# Patient Record
Sex: Male | Born: 1935 | Race: White | Hispanic: No | Smoking: Former smoker
Health system: Southern US, Community
[De-identification: ages and names within clinical notes are randomized; demographics above are authoritative.]

## PROBLEM LIST (undated history)

## (undated) DIAGNOSIS — R569 Unspecified convulsions: Secondary | ICD-10-CM

## (undated) DIAGNOSIS — F0151 Vascular dementia with behavioral disturbance: Secondary | ICD-10-CM

## (undated) DIAGNOSIS — D62 Acute posthemorrhagic anemia: Secondary | ICD-10-CM

## (undated) DIAGNOSIS — I480 Paroxysmal atrial fibrillation: Secondary | ICD-10-CM

## (undated) DIAGNOSIS — I10 Essential (primary) hypertension: Secondary | ICD-10-CM

## (undated) DIAGNOSIS — Z950 Presence of cardiac pacemaker: Secondary | ICD-10-CM

## (undated) DIAGNOSIS — E118 Type 2 diabetes mellitus with unspecified complications: Secondary | ICD-10-CM

## (undated) DIAGNOSIS — I6330 Cerebral infarction due to thrombosis of unspecified cerebral artery: Secondary | ICD-10-CM

## (undated) DIAGNOSIS — R031 Nonspecific low blood-pressure reading: Secondary | ICD-10-CM

## (undated) DIAGNOSIS — I639 Cerebral infarction, unspecified: Secondary | ICD-10-CM

## (undated) DIAGNOSIS — N400 Enlarged prostate without lower urinary tract symptoms: Secondary | ICD-10-CM

## (undated) DIAGNOSIS — E785 Hyperlipidemia, unspecified: Secondary | ICD-10-CM

## (undated) DIAGNOSIS — I69393 Ataxia following cerebral infarction: Secondary | ICD-10-CM

## (undated) DIAGNOSIS — I69991 Dysphagia following unspecified cerebrovascular disease: Secondary | ICD-10-CM

## (undated) DIAGNOSIS — K219 Gastro-esophageal reflux disease without esophagitis: Secondary | ICD-10-CM

## (undated) DIAGNOSIS — M1990 Unspecified osteoarthritis, unspecified site: Secondary | ICD-10-CM

## (undated) DIAGNOSIS — Z7901 Long term (current) use of anticoagulants: Secondary | ICD-10-CM

## (undated) DIAGNOSIS — I2510 Atherosclerotic heart disease of native coronary artery without angina pectoris: Secondary | ICD-10-CM

## (undated) DIAGNOSIS — G9340 Encephalopathy, unspecified: Secondary | ICD-10-CM

## (undated) DIAGNOSIS — I69398 Other sequelae of cerebral infarction: Secondary | ICD-10-CM

## (undated) DIAGNOSIS — I619 Nontraumatic intracerebral hemorrhage, unspecified: Secondary | ICD-10-CM

## (undated) DIAGNOSIS — F0390 Unspecified dementia without behavioral disturbance: Secondary | ICD-10-CM

## (undated) DIAGNOSIS — Z8673 Personal history of transient ischemic attack (TIA), and cerebral infarction without residual deficits: Secondary | ICD-10-CM

## (undated) DIAGNOSIS — I69359 Hemiplegia and hemiparesis following cerebral infarction affecting unspecified side: Secondary | ICD-10-CM

## (undated) DIAGNOSIS — I951 Orthostatic hypotension: Secondary | ICD-10-CM

## (undated) DIAGNOSIS — N182 Chronic kidney disease, stage 2 (mild): Secondary | ICD-10-CM

## (undated) DIAGNOSIS — I441 Atrioventricular block, second degree: Secondary | ICD-10-CM

## (undated) DIAGNOSIS — R4182 Altered mental status, unspecified: Secondary | ICD-10-CM

## (undated) DIAGNOSIS — G459 Transient cerebral ischemic attack, unspecified: Secondary | ICD-10-CM

## (undated) DIAGNOSIS — R269 Unspecified abnormalities of gait and mobility: Secondary | ICD-10-CM

## (undated) DIAGNOSIS — G8191 Hemiplegia, unspecified affecting right dominant side: Secondary | ICD-10-CM

## (undated) DIAGNOSIS — I4891 Unspecified atrial fibrillation: Secondary | ICD-10-CM

## (undated) DIAGNOSIS — R4701 Aphasia: Secondary | ICD-10-CM

## (undated) DIAGNOSIS — J449 Chronic obstructive pulmonary disease, unspecified: Secondary | ICD-10-CM

## (undated) DIAGNOSIS — C4491 Basal cell carcinoma of skin, unspecified: Secondary | ICD-10-CM

## (undated) HISTORY — PX: LAPAROSCOPIC INCISIONAL / UMBILICAL / VENTRAL HERNIA REPAIR: SUR789

## (undated) HISTORY — DX: Orthostatic hypotension: I95.1

## (undated) HISTORY — DX: Cerebral infarction, unspecified: I63.9

## (undated) HISTORY — DX: Unspecified atrial fibrillation: I48.91

## (undated) HISTORY — DX: Atrioventricular block, second degree: I44.1

## (undated) HISTORY — PX: FRACTURE SURGERY: SHX138

## (undated) HISTORY — DX: Chronic kidney disease, stage 2 (mild): N18.2

## (undated) HISTORY — DX: Altered mental status, unspecified: R41.82

## (undated) HISTORY — DX: Nonspecific low blood-pressure reading: R03.1

## (undated) HISTORY — DX: Other sequelae of cerebral infarction: I69.398

## (undated) HISTORY — DX: Hemiplegia and hemiparesis following cerebral infarction affecting unspecified side: I69.359

## (undated) HISTORY — DX: Type 2 diabetes mellitus with unspecified complications: E11.8

## (undated) HISTORY — DX: Chronic obstructive pulmonary disease, unspecified: J44.9

## (undated) HISTORY — DX: Aphasia: R47.01

## (undated) HISTORY — DX: Atherosclerotic heart disease of native coronary artery without angina pectoris: I25.10

## (undated) HISTORY — DX: Vascular dementia with behavioral disturbance: F01.51

## (undated) HISTORY — DX: Unspecified dementia, unspecified severity, without behavioral disturbance, psychotic disturbance, mood disturbance, and anxiety: F03.90

## (undated) HISTORY — DX: Acute posthemorrhagic anemia: D62

## (undated) HISTORY — DX: Hemiplegia, unspecified affecting right dominant side: G81.91

## (undated) HISTORY — DX: Unspecified convulsions: R56.9

## (undated) HISTORY — DX: Long term (current) use of anticoagulants: Z79.01

## (undated) HISTORY — PX: CATARACT EXTRACTION, BILATERAL: SHX1313

## (undated) HISTORY — DX: Unspecified abnormalities of gait and mobility: R26.9

## (undated) HISTORY — DX: Paroxysmal atrial fibrillation: I48.0

## (undated) HISTORY — DX: Ataxia following cerebral infarction: I69.393

## (undated) HISTORY — DX: Essential (primary) hypertension: I10

## (undated) HISTORY — DX: Hyperlipidemia, unspecified: E78.5

## (undated) HISTORY — PX: HERNIA REPAIR: SHX51

## (undated) HISTORY — DX: Benign prostatic hyperplasia without lower urinary tract symptoms: N40.0

## (undated) HISTORY — DX: Cerebral infarction due to thrombosis of unspecified cerebral artery: I63.30

## (undated) HISTORY — DX: Personal history of transient ischemic attack (TIA), and cerebral infarction without residual deficits: Z86.73

## (undated) HISTORY — DX: Nontraumatic intracerebral hemorrhage, unspecified: I61.9

## (undated) HISTORY — DX: Encephalopathy, unspecified: G93.40

## (undated) HISTORY — DX: Dysphagia following unspecified cerebrovascular disease: I69.991

---

## 2001-10-13 ENCOUNTER — Ambulatory Visit (HOSPITAL_COMMUNITY): Admission: RE | Admit: 2001-10-13 | Discharge: 2001-10-13 | Payer: Self-pay | Admitting: Gastroenterology

## 2001-10-14 ENCOUNTER — Encounter: Payer: Self-pay | Admitting: Gastroenterology

## 2001-10-14 ENCOUNTER — Ambulatory Visit (HOSPITAL_COMMUNITY): Admission: RE | Admit: 2001-10-14 | Discharge: 2001-10-14 | Payer: Self-pay | Admitting: *Deleted

## 2004-10-03 HISTORY — PX: CORONARY ANGIOPLASTY WITH STENT PLACEMENT: SHX49

## 2004-11-16 ENCOUNTER — Encounter (HOSPITAL_COMMUNITY): Admission: RE | Admit: 2004-11-16 | Discharge: 2005-02-14 | Payer: Self-pay | Admitting: Cardiovascular Disease

## 2005-02-15 ENCOUNTER — Encounter (HOSPITAL_COMMUNITY): Admission: RE | Admit: 2005-02-15 | Discharge: 2005-03-01 | Payer: Self-pay | Admitting: Cardiovascular Disease

## 2005-03-14 ENCOUNTER — Inpatient Hospital Stay (HOSPITAL_COMMUNITY): Admission: EM | Admit: 2005-03-14 | Discharge: 2005-03-16 | Payer: Self-pay | Admitting: Emergency Medicine

## 2005-03-15 ENCOUNTER — Encounter (INDEPENDENT_AMBULATORY_CARE_PROVIDER_SITE_OTHER): Payer: Self-pay | Admitting: Cardiology

## 2007-07-01 HISTORY — PX: CARDIOVASCULAR STRESS TEST: SHX262

## 2008-04-29 ENCOUNTER — Encounter: Admission: RE | Admit: 2008-04-29 | Discharge: 2008-04-29 | Payer: Self-pay | Admitting: Surgery

## 2008-05-21 ENCOUNTER — Ambulatory Visit (HOSPITAL_COMMUNITY): Admission: RE | Admit: 2008-05-21 | Discharge: 2008-05-22 | Payer: Self-pay | Admitting: Surgery

## 2010-03-30 ENCOUNTER — Ambulatory Visit: Payer: Self-pay | Admitting: Cardiovascular Disease

## 2010-06-15 LAB — COMPREHENSIVE METABOLIC PANEL
ALT: 23 U/L (ref 0–53)
AST: 27 U/L (ref 0–37)
Albumin: 3.8 g/dL (ref 3.5–5.2)
Alkaline Phosphatase: 62 U/L (ref 39–117)
BUN: 17 mg/dL (ref 6–23)
CO2: 28 mEq/L (ref 19–32)
Calcium: 9.4 mg/dL (ref 8.4–10.5)
Chloride: 103 mEq/L (ref 96–112)
Creatinine, Ser: 1.25 mg/dL (ref 0.4–1.5)
GFR calc Af Amer: >60 mL/min (ref >60)
GFR calc non Af Amer: 56 mL/min — ABNORMAL LOW (ref >60)
Glucose, Bld: 89 mg/dL (ref 70–99)
Potassium: 4.2 mEq/L (ref 3.5–5.1)
Sodium: 137 mEq/L (ref 135–145)
Total Bilirubin: 0.7 mg/dL (ref 0.3–1.2)
Total Protein: 6.3 g/dL (ref 6.0–8.3)

## 2010-06-15 LAB — DIFFERENTIAL
Basophils Absolute: 0 10*3/uL (ref 0.0–0.1)
Basophils Relative: 0 % (ref 0–1)
Eosinophils Absolute: 0.1 10*3/uL (ref 0.0–0.7)
Eosinophils Relative: 2 % (ref 0–5)
Lymphocytes Relative: 19 % (ref 12–46)
Lymphs Abs: 1.2 10*3/uL (ref 0.7–4.0)
Monocytes Absolute: 0.6 10*3/uL (ref 0.1–1.0)
Monocytes Relative: 10 % (ref 3–12)
Neutro Abs: 4.5 10*3/uL (ref 1.7–7.7)
Neutrophils Relative %: 69 % (ref 43–77)

## 2010-06-15 LAB — HEMOGLOBIN A1C
Hgb A1c MFr Bld: 5.7 % (ref 4.6–6.1)
Mean Plasma Glucose: 117 mg/dL

## 2010-06-15 LAB — CBC
HCT: 36.9 % — ABNORMAL LOW (ref 39.0–52.0)
Hemoglobin: 12.8 g/dL — ABNORMAL LOW (ref 13.0–17.0)
MCHC: 34.6 g/dL (ref 30.0–36.0)
MCV: 95.4 fL (ref 78.0–100.0)
Platelets: 239 10*3/uL (ref 150–400)
RBC: 3.86 MIL/uL — ABNORMAL LOW (ref 4.22–5.81)
RDW: 14.5 % (ref 11.5–15.5)
WBC: 6.5 10*3/uL (ref 4.0–10.5)

## 2010-06-15 LAB — GLUCOSE, CAPILLARY
Glucose-Capillary: 124 mg/dL — ABNORMAL HIGH (ref 70–99)
Glucose-Capillary: 126 mg/dL — ABNORMAL HIGH (ref 70–99)
Glucose-Capillary: 97 mg/dL (ref 70–99)
Glucose-Capillary: 98 mg/dL (ref 70–99)

## 2010-07-18 NOTE — Op Note (Signed)
Herbert Marquez, Herbert Marquez               ACCOUNT NO.:  [id number]   MEDICAL RECORD NO.:  [id number]          PATIENT TYPE:  AMB   LOCATION:  SDS                          FACILITY:  [facility]   PHYSICIAN:  Wilmon Arms. Corliss Skains, M.D. DATE OF BIRTH:  01-13-1928   DATE OF PROCEDURE:  05/21/2008  DATE OF DISCHARGE:                               OPERATIVE REPORT   PREOPERATIVE DIAGNOSIS:  Ventral hernia.   POSTOPERATIVE DIAGNOSIS:  Ventral hernia.   PROCEDURE PERFORMED:  Laparoscopic ventral hernia repair with mesh.   SURGEON:  Wilmon Arms. Corliss Skains, MD   ANESTHESIA:  General.   INDICATIONS:  The patient is a 77 year old male with history of coronary  artery disease, diabetes, and hypertension who presents with a 25-year  history of enlarging ventral hernia.  The patient remembers feeling a  tear in his abdominal wall at 25 years ago.  It subsequently developed  in a small knot, which has an enlarged traumatically over the last 25  years and has become a bulge that measures about 50 cm across and  protrudes visibly.  This is no longer reducible.  He denies any GI  obstructive symptoms.  A CT scan confirmed a ventral hernia, but this  showed that the hernia contained only fat.  There is no sign of bowel  involved in the defect.   DESCRIPTION OF PROCEDURE:  The patient brought to the operating room,  placed in supine position on the operating table.  After adequate level  of general anesthesia was obtained, a Foley catheter was placed under  sterile technique.  The patient's abdomen was prepped with Betadine and  draped in sterile fashion.  Time-out was taken to assure proper patient  and proper procedure.  We cannulated the peritoneal cavity with a 5-mm  Optiview trocar in the left upper quadrant just off the costal margin.  We insufflated CO2 maintaining maximal pressure of 15 mmHg.  We could  visualize large amount of omentum going up to a fairly small hernia  defect.  We placed an 11-mm port as well  as another 5-mm port in the  left anterior axillary line.  We used a harmonic scalpel to lyse some of  the adhesions around the hernia sac.  Using traction and manual  compression, we were able to reduce a large amount of omentum out of  this hernia defect.  The hernia sac was palpated was felt to be about 50  cm across, but defect was much smaller.  Once we were able to  successfully reduce all of this incarcerated omentum, we took down the  falciform ligament with the harmonic scalpel.  The defect was located  just below the lower end of the falciform ligament.  A spinal needle was  used to delineate the edges of the defect.  This defect measured about 5  x 5 cm.  We used a 12 x 12 round piece of the Parietex mesh.  We placed  4 stay sutures of 0 Prolene in all four quadrants of the mesh.  The mesh  was then prehydrated, rolled up, and inserted into the peritoneal  cavity.  Prior to securing the mesh, we inspected for hemostasis.  No  bleeding was noted.  We used the Endoclose device to pull up the sutures  in all four quadrants.  We had to move couple of the suture holes more  distal to provide good coverage with a tension.  Once all four stay  sutures had been pulled up, we tied them down to suspend the mesh from  the fascia.  The mesh was in good placement with good coverage of the  defect and seemed to be stretched tightly across this area.  We then  used the AbsorbaTack to secure the edges of the mesh in place.  We  placed two additional 5-mm ports along the right anterior axillary line  and applied adequate visualization and placement of these tacks.  Once  we had placed the tacks, we again inspected the mesh, which seemed to be  very secure covering the entire defect.  We removed 11-mm port in the  left side and closed the fascia with a 0 Vicryl suture using the  Endoclose device.  Pneumoperitoneum was then released as to remove the  remainder of the trocars.  A 4-0 Monocryl was  used to close all the skin  incisions.  Dermabond was used to seal all the incisions including the puncture  sites.  The patient's Foley was removed.  He was then extubated and  brought to the recovery room in stable condition.  All sponge,  instrument, and needle counts were correct.      Wilmon Arms. Tsuei, M.D.  Electronically Signed     MKT/MEDQ  D:  05/21/2008  T:  05/22/2008  Job:  [id number]   cc:   Alfonse Alpers. Dagoberto Ligas, M.D.  Vesta Mixer, M.D.

## 2010-07-21 NOTE — H&P (Signed)
NAME:  Herbert Marquez, Herbert Marquez               ACCOUNT NO.:  [id number]   MEDICAL RECORD NO.:  [id number]          PATIENT TYPE:  INP   LOCATION:  3733                         FACILITY:  [facility]   PHYSICIAN:  Alfonse Alpers. Gegick, M.D.DATE OF BIRTH:  [birth year]   DATE OF ADMISSION:  03/14/2005  DATE OF DISCHARGE:                                HISTORY & PHYSICAL   CHIEF COMPLAINT:  This is a 77 year old man who presents with a history of  slurred speech.   HISTORY OF PRESENT ILLNESS:  The patient has previously had an episode of  slurred speech beginning approximately two weeks prior to this admission.  At that time he had an episode lasting less than two minutes associated with  inability to talk correctly.  His speech was slurred and it improved  dramatically.  Again, on the day of this admission while driving he had an  episode again lasting less than two minutes and his speech was slurred and  he had some drooling from the right side of the mouth.  His wife was with  him at that time and again his episode completely resolved.  He presented to  the emergency room with these symptoms.   He has a history of arteriosclerotic heart disease.  He has discontinued  smoking in 1973 and he had an episode of myocardial infarction in August of  2006.  At that time two stents were in place and he is currently receiving  Plavix and aspirin for clot prevention.   He has a history of hypertension.  His blood pressure history generally been  well controlled.  He has had moderate systolic hypertension and again this  has been controlled.  His most recent office visit his systolic was 160 and  his Cardura was increased at that time.   He has a history of dyslipidemia.  He has been taking Lipitor for this.  His  most recent LDL is 77.   He has a history of benign prostatic hypertrophy and has had episodes of  prostatitis.  Most recently this has been stable.   PAST MEDICAL HISTORY:  Essentially that as noted  above.  He has had  myocardial infarction as noted above.   MEDICATIONS PRIOR TO ADMISSION:  1.  Lipitor 80 mg one daily.  2.  Cardura 8 mg one daily (possibly one-half).  3.  Altace 10 mg one daily.  4.  Toprol XL 50 mg one daily.  5.  Enteric-coated aspirin 325 mg one daily.  6.  Plavix 75 mg one daily.  7.  Prilosec 20 mg one daily.   PERSONAL HISTORY:  He smoked until 1973 at which time he stopped.  He denies  excessive alcohol intake.  No history of allergies.   REVIEW OF SYSTEMS:  The patient has been feeling fine, doing well, and  having no problems prior to this admission.  CARDIOVASCULAR/RESPIRATORY:  See above.  GI:  No complaints.  GU:  See above.   PHYSICAL EXAMINATION:  GENERAL:  This is a well-developed man who appears in  no clinical distress.  At this time he is  completely asymptomatic.  HEENT:  Normocephalic without any evidence of trauma.  NECK:  No bruits present.  LUNGS:  Clear.  CARDIOVASCULAR:  Rhythm is regular.  No murmurs are present.  ABDOMEN:  Soft.  No masses are present.  No bruits are present.  EXTREMITIES:  No edema is present.  Peripheral pulses are palpable.  NEUROMUSCULAR:  Completely normal at this time.  This was done by Dr. Sandria Manly  and will not be repeated at this time.   IMPRESSION:  1.  Transient ischemic attack.  2.  History of arteriosclerotic heart disease with previous myocardial      infarction requiring two stents.  3.  History of hypertension.  4.  History of dyslipidemia.  5.  History of benign prostatic hypertrophy.           ______________________________  Alfonse Alpers. Dagoberto Ligas, M.D.     CGG/MEDQ  D:  03/14/2005  T:  03/14/2005  Job:  [id number]

## 2010-07-21 NOTE — Consult Note (Signed)
NAME:  Herbert Marquez, Herbert Marquez               ACCOUNT NO.:  [id number]   MEDICAL RECORD NO.:  [id number]          PATIENT TYPE:  INP   LOCATION:  3733                         FACILITY:  [facility]   PHYSICIAN:  Genene Churn. Love, M.D.    DATE OF BIRTH:  08/02/1927   DATE OF CONSULTATION:  DATE OF DISCHARGE:                                   CONSULTATION   This 77 year old right handed white married male has a 40 year history of  hypertension, known coronary artery disease, and is status post inferior  myocardial infarction August of 2006 for which he was treated with two  stents.  He has been on aspirin and Plavix.  Two weeks ago as an outpatient  he developed change in speech difficulties for approximately 2 minutes.  Today he had a similar episode in which he is speech was intelligible and  had drooling out the right side of his mouth but no associated headaches,  chest pain, palpitations, syncope or seizure.  He came to the emergency room  and is being evaluated.   PAST MEDICAL HISTORY:  Significant for hypertension for approximately 40  years, coronary artery disease, elevated lipids. He is a former smoker.  He  has had BPH, and arthritis.   MEDICATIONS:  1.  Aspirin 325 mg q. day.  2.  Altace 10 mg q. day.  3.  Plavix 75 mg q. day.  4.  Prilosec 20 mg q. day.  5.  Toprol XL 50 mg q. day.  6.  Lipitor 80 mg q. day.  7.  Cardura 80 mg q. day.   PHYSICAL EXAMINATION:  Well-developed white male.  Blood pressure in right  and left arm 160/80, heart rate 64 and regular.  There were no bruits.  MENTAL STATUS:  He is alert, oriented x 3.  CRANIAL NERVES:  Showed visual fields full.  He was status post cataract  surgery.  Disks were flat.  Extraocular movements were full.  Corneals were  present.  Tongue was midline.  The uvula was midline.  Gags were present.  Motor 5/5 strength in upper and lower extremities, coordination testing,  finger to nose intact. Sensory examination intact to pinprick,  touch,  vibration testing.  Decreased graphesthesia in his left hand.  Deep tendon  reflexes 2+.  Plantar responses not evaluated.  Gait examination was within  normal limits.   IMPRESSION:  1.  Dysarthria, 784.5 most likely representing transient ischemic attack,      435.9.  2.  Hypertension, 796.2.  3.  Hyperlipidemia, 273.4.  4.  Recent inferior myocardial infarction secondary to coronary artery      disease, 492.9.   __________ CT scan, MRI, MRA of the brain and workup for treatable causes of  stroke and lowering risk factors.           ______________________________  Genene Churn. Sandria Manly, M.D.     JML/MEDQ  D:  03/14/2005  T:  03/15/2005  Job:  [id number]   cc:   Alfonse Alpers. Dagoberto Ligas, M.D.  Fax: [phone]

## 2010-07-21 NOTE — Discharge Summary (Signed)
NAME:  Herbert Marquez, Herbert Marquez               ACCOUNT NO.:  [id number]   MEDICAL RECORD NO.:  [id number]          PATIENT TYPE:  INP   LOCATION:  3733                         FACILITY:  [facility]   PHYSICIAN:  Alfonse Alpers. Gegick, M.D.DATE OF BIRTH:  Jan 19, 1928   DATE OF ADMISSION:  03/14/2005  DATE OF DISCHARGE:  03/16/2005                                 DISCHARGE SUMMARY   HISTORY:  This is a 77 year old man who has had a recurrent transient  ischemic attack. The patient has had a history of arteriosclerotic heart  disease in the past. He had a myocardial infarction in August 2006. He had  two stents placed at that time and had right coronary artery disease. He has  been doing well while taking his aspirin and Plavix and then approximately  two weeks ago he had an episode of difficulty thinking and also some  dysarthrias. This resolved very quickly within a minute or two minutes. He  then another episode on the day of this admission. While driving the car he  noted some drooling on the left side of his mouth and also his speech was  garbled. His symptoms completely cleared. There is no history of chest pain.   He also has a history of hypertension and dyslipidemia. He has been taking  Lipitor for his dyslipidemia and his LDL cholesterol has been approximately  60 to 70. He also has had a good HDL response, with his HDL increasing up to  47. His hypertension has also been controlled. He also has a history of  benign prostatic hypertrophy.   PHYSICAL EXAMINATION:  On admission a well-developed man who neurologically  was completely intact without any residual. His lungs were clear. His heart  exam was normal. His abdomen was soft. Extremities were negative.   IMPRESSION ON ADMISSION:  1.  Transient ischemic attack.  2.  History of hypertension.  3.  History of dyslipidemia.  4.  History of arteriosclerotic heart disease.   HOSPITAL COURSE:  The patient was admitted to the hospital for  observation  and an MRI brain scan was done that showed acute lesions.  In addition, a  carotid artery Doppler was done and this showed diffuse moderate calcific  plaque disease. The patient had no more symptoms. He was then discharged.   IMPRESSION ON DISCHARGE:  1.  Transient ischemic attack.  2.  History of dyslipidemia (treated).   MEDICATIONS ON DISCHARGE:  1.  Lipitor 80 mg one daily.  2.  Cardura 8 mg one daily.  3.  Altace 2.5 mg one daily.  4.  Toprol XL 100 mg one-half daily.  5.  Enteric-coated aspirin 325 mg one daily.  6.  Plavix 75 mg one daily.  7.  Prilosec 20 mg one daily.   DIET ON DISCHARGE:  As tolerated.   PLAN AND FOLLOW-UP:  To be seen in the office in a period of one week. At  that time a glucose tolerance test will be done with the intention of  treating his metabolic syndrome in the event that this is present.  ______________________________  Alfonse Alpers Dagoberto Ligas, M.D.     CGG/MEDQ  D:  03/16/2005  T:  03/16/2005  Job:  [id number]   cc:   Genene Churn. Love, M.D.  Fax: [phone]

## 2010-09-08 ENCOUNTER — Ambulatory Visit: Payer: Self-pay | Admitting: Cardiovascular Disease

## 2010-09-14 ENCOUNTER — Encounter: Payer: Self-pay | Admitting: Cardiovascular Disease

## 2010-09-15 ENCOUNTER — Other Ambulatory Visit: Payer: Self-pay | Admitting: Cardiovascular Disease

## 2010-09-15 NOTE — Telephone Encounter (Signed)
Fax received from pharmacy. Refill completed. Jodette Korey Prashad RN  

## 2010-09-20 ENCOUNTER — Ambulatory Visit (INDEPENDENT_AMBULATORY_CARE_PROVIDER_SITE_OTHER): Payer: Medicare Other | Admitting: Cardiovascular Disease

## 2010-09-20 ENCOUNTER — Encounter: Payer: Self-pay | Admitting: Cardiovascular Disease

## 2010-09-20 DIAGNOSIS — I2510 Atherosclerotic heart disease of native coronary artery without angina pectoris: Secondary | ICD-10-CM

## 2010-09-20 DIAGNOSIS — E119 Type 2 diabetes mellitus without complications: Secondary | ICD-10-CM

## 2010-09-20 DIAGNOSIS — I10 Essential (primary) hypertension: Secondary | ICD-10-CM

## 2010-09-20 MED ORDER — NITROGLYCERIN 0.4 MG SL SUBL: 0.4000 mg | SUBLINGUAL_TABLET | SUBLINGUAL | Status: DC | PRN

## 2010-09-20 NOTE — Assessment & Plan Note (Signed)
Remains very stable. He is not having any episodes of chest pain or shortness of breath. We will continue with his same medications.

## 2010-09-20 NOTE — Progress Notes (Signed)
Herbert Marquez Date of Birth  1927-07-27 [facility] / [facility] [address]     [address] [place], [place]  [address] [phone]  Fax  [phone]  History of Present Illness:  77 yo with history of CAD - s/p stenting in [place], [place].  No angina. No dyspnea.  Works out at [employer] without problems.     Current Outpatient Prescriptions on File Prior to Visit  Medication Sig Dispense Refill  . atorvastatin (LIPITOR) 40 MG tablet Take 40 mg by mouth daily.        . diphenhydrAMINE (BENADRYL) 25 MG tablet Take 25 mg by mouth every 6 (six) hours as needed.        . doxazosin (CARDURA) 8 MG tablet Take 8 mg by mouth at bedtime.        . metoprolol (TOPROL-XL) 100 MG 24 hr tablet Take 50 mg by mouth daily.       . Multiple Vitamin (MULTIVITAMIN) tablet Take 1 tablet by mouth daily.        . nitroGLYCERIN (NITROSTAT) 0.4 MG SL tablet Place 0.4 mg under the tongue every 5 (five) minutes as needed.        [name] omeprazole (PRILOSEC) 20 MG capsule Take 20 mg by mouth daily.        . pioglitazone (ACTOS) 30 MG tablet Take 30 mg by mouth daily.        . ramipril (ALTACE) 10 MG tablet Take 2.5 mg by mouth daily.       [name] triamterene-hydrochlorothiazide (MAXZIDE) 75-50 MG per tablet TAKE ONE-HALF TABLET BY MOUTH EVERY DAY  15 tablet  3  . triamterene-hydrochlorothiazide (MAXZIDE-25) 37.5-25 MG per tablet Take 1 tablet by mouth daily. 1/2 DAILY         No Known Allergies  Past Medical History  Diagnosis Date  . Coronary artery disease   . Hyperlipidemia   . Hypertension   . Diabetes mellitus   . BPH (benign prostatic hypertrophy)     Past Surgical History  Procedure Date  . Coronary angioplasty with stent placement     RCA  . Hernia repair   . Cataract extraction, bilateral   . Cardiovascular stress test 07/01/2007    EF 74%    History  Smoking status  . Former Smoker  . Quit date: 03/06/1971  Smokeless tobacco  . Not on file    History  Alcohol  Use No    Family History  Problem Relation Age of Onset  . Lung cancer Father   . Lung cancer Brother     Reviw of Systems:  Reviewed in the HPI.  All other systems are negative.  Physical Exam: BP 116/78  Pulse 64  Ht 5\' 7"  (1.702 m)  Wt 202 lb (91.627 kg)  BMI 31.64 kg/m2 The patient is alert and oriented x 3.  The mood and affect are normal.   Skin: warm and dry.  Color is normal.    HEENT:   the sclera are nonicteric.  The mucous membranes are moist.  The carotids are 2+ without bruits.  There is no thyromegaly.  There is no JVD.    Lungs: clear.  The chest wall is non tender.    Heart: regular rate with a normal S1 and S2.  There are no murmurs, gallops, or rubs. The PMI is not displaced.     Abdomin: good bowel sounds.  There is no guarding or rebound.  There is no hepatosplenomegaly  or tenderness.  There are no masses.   Extremities:  no clubbing, cyanosis, or edema.  The legs are without rashes.  The distal pulses are intact.   Neuro:  Cranial nerves II - XII are intact.  Motor and sensory functions are intact.    The gait is normal.  ECG:  Assessment / Plan:

## 2010-09-20 NOTE — Assessment & Plan Note (Signed)
His blood pressure remains very well controlled. We will continue with his same medications.

## 2011-03-09 ENCOUNTER — Emergency Department (HOSPITAL_COMMUNITY): Payer: Medicare Other

## 2011-03-09 ENCOUNTER — Encounter (HOSPITAL_COMMUNITY)
Admission: EM | Disposition: A | Discharge status: Home Health | Payer: Self-pay | Source: Home / Self Care | Attending: Internal Medicine

## 2011-03-09 ENCOUNTER — Encounter (HOSPITAL_COMMUNITY): Payer: Self-pay | Admitting: Anesthesiology

## 2011-03-09 ENCOUNTER — Other Ambulatory Visit: Payer: Self-pay

## 2011-03-09 ENCOUNTER — Inpatient Hospital Stay (HOSPITAL_COMMUNITY)
Admission: EM | Admit: 2011-03-09 | Discharge: 2011-03-13 | DRG: 470 | Disposition: A | Discharge status: Home Health | Payer: Medicare Other | Attending: Internal Medicine | Admitting: Internal Medicine

## 2011-03-09 ENCOUNTER — Emergency Department (HOSPITAL_COMMUNITY): Payer: Medicare Other | Admitting: Anesthesiology

## 2011-03-09 ENCOUNTER — Encounter (HOSPITAL_COMMUNITY): Payer: Self-pay

## 2011-03-09 DIAGNOSIS — Z8673 Personal history of transient ischemic attack (TIA), and cerebral infarction without residual deficits: Secondary | ICD-10-CM | POA: Diagnosis not present

## 2011-03-09 DIAGNOSIS — Z9861 Coronary angioplasty status: Secondary | ICD-10-CM | POA: Diagnosis not present

## 2011-03-09 DIAGNOSIS — Z79899 Other long term (current) drug therapy: Secondary | ICD-10-CM

## 2011-03-09 DIAGNOSIS — I2510 Atherosclerotic heart disease of native coronary artery without angina pectoris: Secondary | ICD-10-CM | POA: Diagnosis present

## 2011-03-09 DIAGNOSIS — Z7982 Long term (current) use of aspirin: Secondary | ICD-10-CM | POA: Diagnosis not present

## 2011-03-09 DIAGNOSIS — I252 Old myocardial infarction: Secondary | ICD-10-CM | POA: Diagnosis not present

## 2011-03-09 DIAGNOSIS — S72009A Fracture of unspecified part of neck of unspecified femur, initial encounter for closed fracture: Secondary | ICD-10-CM

## 2011-03-09 DIAGNOSIS — E119 Type 2 diabetes mellitus without complications: Secondary | ICD-10-CM | POA: Diagnosis present

## 2011-03-09 DIAGNOSIS — E871 Hypo-osmolality and hyponatremia: Secondary | ICD-10-CM | POA: Diagnosis not present

## 2011-03-09 DIAGNOSIS — S72002A Fracture of unspecified part of neck of left femur, initial encounter for closed fracture: Secondary | ICD-10-CM | POA: Diagnosis present

## 2011-03-09 DIAGNOSIS — Z01810 Encounter for preprocedural cardiovascular examination: Secondary | ICD-10-CM

## 2011-03-09 DIAGNOSIS — S329XXA Fracture of unspecified parts of lumbosacral spine and pelvis, initial encounter for closed fracture: Secondary | ICD-10-CM | POA: Diagnosis not present

## 2011-03-09 DIAGNOSIS — E785 Hyperlipidemia, unspecified: Secondary | ICD-10-CM | POA: Diagnosis present

## 2011-03-09 DIAGNOSIS — Y998 Other external cause status: Secondary | ICD-10-CM

## 2011-03-09 DIAGNOSIS — Y92009 Unspecified place in unspecified non-institutional (private) residence as the place of occurrence of the external cause: Secondary | ICD-10-CM

## 2011-03-09 DIAGNOSIS — D72829 Elevated white blood cell count, unspecified: Secondary | ICD-10-CM | POA: Diagnosis not present

## 2011-03-09 DIAGNOSIS — W010XXA Fall on same level from slipping, tripping and stumbling without subsequent striking against object, initial encounter: Secondary | ICD-10-CM | POA: Diagnosis present

## 2011-03-09 DIAGNOSIS — N400 Enlarged prostate without lower urinary tract symptoms: Secondary | ICD-10-CM | POA: Diagnosis present

## 2011-03-09 DIAGNOSIS — I10 Essential (primary) hypertension: Secondary | ICD-10-CM | POA: Diagnosis present

## 2011-03-09 DIAGNOSIS — N179 Acute kidney failure, unspecified: Secondary | ICD-10-CM | POA: Diagnosis present

## 2011-03-09 DIAGNOSIS — M25559 Pain in unspecified hip: Secondary | ICD-10-CM | POA: Diagnosis not present

## 2011-03-09 DIAGNOSIS — S72033A Displaced midcervical fracture of unspecified femur, initial encounter for closed fracture: Principal | ICD-10-CM | POA: Diagnosis present

## 2011-03-09 DIAGNOSIS — IMO0001 Reserved for inherently not codable concepts without codable children: Secondary | ICD-10-CM | POA: Diagnosis not present

## 2011-03-09 DIAGNOSIS — Z6831 Body mass index (BMI) 31.0-31.9, adult: Secondary | ICD-10-CM | POA: Diagnosis not present

## 2011-03-09 DIAGNOSIS — D62 Acute posthemorrhagic anemia: Secondary | ICD-10-CM | POA: Diagnosis not present

## 2011-03-09 DIAGNOSIS — R52 Pain, unspecified: Secondary | ICD-10-CM | POA: Diagnosis not present

## 2011-03-09 DIAGNOSIS — J4489 Other specified chronic obstructive pulmonary disease: Secondary | ICD-10-CM | POA: Diagnosis not present

## 2011-03-09 DIAGNOSIS — R918 Other nonspecific abnormal finding of lung field: Secondary | ICD-10-CM | POA: Diagnosis not present

## 2011-03-09 DIAGNOSIS — J449 Chronic obstructive pulmonary disease, unspecified: Secondary | ICD-10-CM

## 2011-03-09 DIAGNOSIS — I517 Cardiomegaly: Secondary | ICD-10-CM | POA: Diagnosis not present

## 2011-03-09 DIAGNOSIS — E669 Obesity, unspecified: Secondary | ICD-10-CM | POA: Diagnosis present

## 2011-03-09 HISTORY — DX: Chronic obstructive pulmonary disease, unspecified: J44.9

## 2011-03-09 HISTORY — DX: Transient cerebral ischemic attack, unspecified: G45.9

## 2011-03-09 HISTORY — PX: HIP ARTHROPLASTY: SHX981

## 2011-03-09 LAB — URINALYSIS, ROUTINE W REFLEX MICROSCOPIC
Bilirubin Urine: NEGATIVE
Glucose, UA: NEGATIVE mg/dL
Hgb urine dipstick: NEGATIVE
Leukocytes, UA: NEGATIVE
Nitrite: NEGATIVE
Protein, ur: NEGATIVE mg/dL
Specific Gravity, Urine: 1.02 (ref 1.005–1.030)
Urobilinogen, UA: 0.2 mg/dL (ref 0.0–1.0)
pH: 5.5 (ref 5.0–8.0)

## 2011-03-09 LAB — GLUCOSE, CAPILLARY
Glucose-Capillary: 97 mg/dL (ref 70–99)
Glucose-Capillary: 119 mg/dL — ABNORMAL HIGH (ref 70–99)
Glucose-Capillary: 158 mg/dL — ABNORMAL HIGH (ref 70–99)

## 2011-03-09 LAB — BASIC METABOLIC PANEL
BUN: 24 mg/dL — ABNORMAL HIGH (ref 6–23)
CO2: 24 mEq/L (ref 19–32)
Calcium: 9.6 mg/dL (ref 8.4–10.5)
Chloride: 101 mEq/L (ref 96–112)
Creatinine, Ser: 1.41 mg/dL — ABNORMAL HIGH (ref 0.50–1.35)
GFR calc Af Amer: 52 mL/min — ABNORMAL LOW (ref >90)
GFR calc non Af Amer: 45 mL/min — ABNORMAL LOW (ref >90)
Glucose, Bld: 100 mg/dL — ABNORMAL HIGH (ref 70–99)
Potassium: 4.3 mEq/L (ref 3.5–5.1)
Sodium: 136 mEq/L (ref 135–145)

## 2011-03-09 LAB — CBC
HCT: 37.1 % — ABNORMAL LOW (ref 39.0–52.0)
Hemoglobin: 12.4 g/dL — ABNORMAL LOW (ref 13.0–17.0)
MCH: 31.9 pg (ref 26.0–34.0)
MCHC: 33.4 g/dL (ref 30.0–36.0)
MCV: 95.4 fL (ref 78.0–100.0)
Platelets: 278 10*3/uL (ref 150–400)
RBC: 3.89 MIL/uL — ABNORMAL LOW (ref 4.22–5.81)
RDW: 14.4 % (ref 11.5–15.5)
WBC: 7.8 10*3/uL (ref 4.0–10.5)

## 2011-03-09 LAB — ABO/RH: ABO/RH(D): A POS

## 2011-03-09 LAB — SODIUM, URINE, RANDOM: Sodium, Ur: 118 mEq/L

## 2011-03-09 LAB — DIFFERENTIAL
Basophils Absolute: 0 10*3/uL (ref 0.0–0.1)
Basophils Relative: 0 % (ref 0–1)
Eosinophils Absolute: 0.1 10*3/uL (ref 0.0–0.7)
Eosinophils Relative: 2 % (ref 0–5)
Lymphocytes Relative: 13 % (ref 12–46)
Lymphs Abs: 1 10*3/uL (ref 0.7–4.0)
Monocytes Absolute: 0.6 10*3/uL (ref 0.1–1.0)
Monocytes Relative: 8 % (ref 3–12)
Neutro Abs: 5.9 10*3/uL (ref 1.7–7.7)
Neutrophils Relative %: 77 % (ref 43–77)

## 2011-03-09 LAB — PROTIME-INR
INR: 1.12 (ref 0.00–1.49)
INR: 1.15 (ref 0.00–1.49)
Prothrombin Time: 14.6 seconds (ref 11.6–15.2)
Prothrombin Time: 14.9 seconds (ref 11.6–15.2)

## 2011-03-09 LAB — CREATININE, URINE, RANDOM: Creatinine, Urine: 130.37 mg/dL

## 2011-03-09 LAB — APTT: aPTT: 33 seconds (ref 24–37)

## 2011-03-09 LAB — PREPARE RBC (CROSSMATCH)

## 2011-03-09 LAB — TSH: TSH: 1.789 u[IU]/mL (ref 0.350–4.500)

## 2011-03-09 SURGERY — HEMIARTHROPLASTY, HIP, DIRECT ANTERIOR APPROACH, FOR FRACTURE
Anesthesia: General | Site: Hip | Laterality: Left | Wound class: Clean

## 2011-03-09 MED ORDER — METOPROLOL SUCCINATE ER 50 MG PO TB24
50.0000 mg | ORAL_TABLET | Freq: Every day | ORAL | Status: DC
  Administered 2011-03-09 – 2011-03-13 (×5): 50 mg via ORAL
  Filled 2011-03-09 (×5): qty 1

## 2011-03-09 MED ORDER — ONDANSETRON HCL 4 MG PO TABS: 4.0000 mg | ORAL_TABLET | Freq: Four times a day (QID) | ORAL | Status: DC | PRN

## 2011-03-09 MED ORDER — HYDROMORPHONE HCL PF 1 MG/ML IJ SOLN
INTRAMUSCULAR | Status: AC
  Filled 2011-03-09: qty 1

## 2011-03-09 MED ORDER — ACETAMINOPHEN 650 MG RE SUPP: 650.0000 mg | Freq: Four times a day (QID) | RECTAL | Status: DC | PRN

## 2011-03-09 MED ORDER — METOCLOPRAMIDE HCL 10 MG PO TABS: 5.0000 mg | ORAL_TABLET | Freq: Three times a day (TID) | ORAL | Status: DC | PRN

## 2011-03-09 MED ORDER — BISACODYL 5 MG PO TBEC: 5.0000 mg | DELAYED_RELEASE_TABLET | Freq: Every day | ORAL | Status: DC | PRN

## 2011-03-09 MED ORDER — SODIUM CHLORIDE 0.45 % IV SOLN
INTRAVENOUS | Status: DC
  Administered 2011-03-09: 21:00:00 via INTRAVENOUS

## 2011-03-09 MED ORDER — ALBUTEROL SULFATE HFA 108 (90 BASE) MCG/ACT IN AERS: 2.0000 | INHALATION_SPRAY | RESPIRATORY_TRACT | Status: DC | PRN

## 2011-03-09 MED ORDER — FENTANYL CITRATE 0.05 MG/ML IJ SOLN
INTRAMUSCULAR | Status: DC | PRN
  Administered 2011-03-09: 100 ug via INTRAVENOUS
  Administered 2011-03-09: 50 ug via INTRAVENOUS

## 2011-03-09 MED ORDER — ACETAMINOPHEN 325 MG PO TABS: 650.0000 mg | ORAL_TABLET | Freq: Four times a day (QID) | ORAL | Status: DC | PRN

## 2011-03-09 MED ORDER — HYDROCODONE-ACETAMINOPHEN 7.5-325 MG PO TABS
1.0000 | ORAL_TABLET | ORAL | Status: DC
  Administered 2011-03-10: 1 via ORAL
  Administered 2011-03-10: 2 via ORAL
  Administered 2011-03-10 (×3): 1 via ORAL
  Administered 2011-03-11 (×2): 2 via ORAL
  Administered 2011-03-11 (×2): 1 via ORAL
  Administered 2011-03-12: 2 via ORAL
  Administered 2011-03-12: 1 via ORAL
  Filled 2011-03-09 (×5): qty 1
  Filled 2011-03-09: qty 2
  Filled 2011-03-09 (×2): qty 1
  Filled 2011-03-09 (×3): qty 2
  Filled 2011-03-09 (×2): qty 1

## 2011-03-09 MED ORDER — 0.9 % SODIUM CHLORIDE (POUR BTL) OPTIME
TOPICAL | Status: DC | PRN
  Administered 2011-03-09: 1000 mL

## 2011-03-09 MED ORDER — ONDANSETRON HCL 4 MG/2ML IJ SOLN
INTRAMUSCULAR | Status: DC | PRN
  Administered 2011-03-09: 4 mg via INTRAVENOUS

## 2011-03-09 MED ORDER — ASPIRIN 81 MG PO CHEW
81.0000 mg | CHEWABLE_TABLET | Freq: Every day | ORAL | Status: DC
  Administered 2011-03-09 – 2011-03-13 (×5): 81 mg via ORAL
  Filled 2011-03-09 (×5): qty 1

## 2011-03-09 MED ORDER — CEFAZOLIN SODIUM-DEXTROSE 2-3 GM-% IV SOLR
2.0000 g | Freq: Four times a day (QID) | INTRAVENOUS | Status: AC
  Administered 2011-03-09 – 2011-03-10 (×3): 2 g via INTRAVENOUS
  Filled 2011-03-09 (×3): qty 50

## 2011-03-09 MED ORDER — ZOLPIDEM TARTRATE 5 MG PO TABS: 5.0000 mg | ORAL_TABLET | Freq: Every evening | ORAL | Status: DC | PRN

## 2011-03-09 MED ORDER — ALUM & MAG HYDROXIDE-SIMETH 200-200-20 MG/5ML PO SUSP: 30.0000 mL | ORAL | Status: DC | PRN

## 2011-03-09 MED ORDER — MENTHOL 3 MG MT LOZG
1.0000 | LOZENGE | OROMUCOSAL | Status: DC | PRN
  Filled 2011-03-09: qty 9

## 2011-03-09 MED ORDER — PANTOPRAZOLE SODIUM 40 MG PO TBEC
40.0000 mg | DELAYED_RELEASE_TABLET | Freq: Every day | ORAL | Status: DC
  Administered 2011-03-09: 40 mg via ORAL
  Filled 2011-03-09 (×2): qty 1

## 2011-03-09 MED ORDER — ALBUTEROL SULFATE (5 MG/ML) 0.5% IN NEBU: 2.5000 mg | INHALATION_SOLUTION | RESPIRATORY_TRACT | Status: DC | PRN

## 2011-03-09 MED ORDER — ACETAMINOPHEN 10 MG/ML IV SOLN
INTRAVENOUS | Status: AC
  Filled 2011-03-09: qty 100

## 2011-03-09 MED ORDER — FLEET ENEMA 7-19 GM/118ML RE ENEM: 1.0000 | ENEMA | Freq: Once | RECTAL | Status: AC | PRN

## 2011-03-09 MED ORDER — DEXAMETHASONE SODIUM PHOSPHATE 10 MG/ML IJ SOLN
INTRAMUSCULAR | Status: DC | PRN
  Administered 2011-03-09: 10 mg via INTRAVENOUS

## 2011-03-09 MED ORDER — PROMETHAZINE HCL 25 MG/ML IJ SOLN: 6.2500 mg | INTRAMUSCULAR | Status: DC | PRN

## 2011-03-09 MED ORDER — FENTANYL CITRATE 0.05 MG/ML IJ SOLN: 100.0000 ug | Freq: Once | INTRAMUSCULAR | Status: DC

## 2011-03-09 MED ORDER — METOCLOPRAMIDE HCL 5 MG/ML IJ SOLN
5.0000 mg | Freq: Three times a day (TID) | INTRAMUSCULAR | Status: DC | PRN
Start: 2011-03-09 — End: 2011-03-13
  Administered 2011-03-12: 10 mg via INTRAVENOUS
  Filled 2011-03-09: qty 2

## 2011-03-09 MED ORDER — HYDROMORPHONE HCL PF 1 MG/ML IJ SOLN: 0.5000 mg | INTRAMUSCULAR | Status: DC | PRN

## 2011-03-09 MED ORDER — DOCUSATE SODIUM 100 MG PO CAPS
100.0000 mg | ORAL_CAPSULE | Freq: Two times a day (BID) | ORAL | Status: DC
  Administered 2011-03-09 – 2011-03-13 (×7): 100 mg via ORAL
  Filled 2011-03-09 (×9): qty 1

## 2011-03-09 MED ORDER — FENTANYL CITRATE 0.05 MG/ML IJ SOLN
INTRAMUSCULAR | Status: AC
  Filled 2011-03-09: qty 2

## 2011-03-09 MED ORDER — SIMVASTATIN 40 MG PO TABS
40.0000 mg | ORAL_TABLET | Freq: Every day | ORAL | Status: DC
  Administered 2011-03-09 – 2011-03-12 (×4): 40 mg via ORAL
  Filled 2011-03-09 (×5): qty 1

## 2011-03-09 MED ORDER — DIPHENHYDRAMINE HCL 25 MG PO CAPS
25.0000 mg | ORAL_CAPSULE | Freq: Four times a day (QID) | ORAL | Status: DC | PRN
  Administered 2011-03-13: 25 mg via ORAL
  Filled 2011-03-09: qty 1

## 2011-03-09 MED ORDER — SODIUM CHLORIDE 0.9 % IV SOLN
100.0000 mL/h | INTRAVENOUS | Status: DC
  Administered 2011-03-09 – 2011-03-11 (×3): 100 mL/h via INTRAVENOUS
  Filled 2011-03-09 (×10): qty 1000

## 2011-03-09 MED ORDER — ACETAMINOPHEN 10 MG/ML IV SOLN
INTRAVENOUS | Status: DC | PRN
  Administered 2011-03-09: 1000 mg via INTRAVENOUS

## 2011-03-09 MED ORDER — MORPHINE SULFATE 2 MG/ML IJ SOLN
4.0000 mg | INTRAMUSCULAR | Status: DC | PRN
  Administered 2011-03-09: 4 mg via INTRAVENOUS
  Filled 2011-03-09 (×2): qty 1

## 2011-03-09 MED ORDER — FENTANYL CITRATE 0.05 MG/ML IJ SOLN
INTRAMUSCULAR | Status: AC
  Administered 2011-03-09: 100 ug via INTRAVENOUS
  Filled 2011-03-09: qty 2

## 2011-03-09 MED ORDER — POLYETHYLENE GLYCOL 3350 17 G PO PACK
17.0000 g | PACK | Freq: Two times a day (BID) | ORAL | Status: DC
  Administered 2011-03-09 – 2011-03-11 (×4): 17 g via ORAL
  Filled 2011-03-09 (×10): qty 1

## 2011-03-09 MED ORDER — PROPOFOL 10 MG/ML IV EMUL
INTRAVENOUS | Status: DC | PRN
  Administered 2011-03-09: 150 mg via INTRAVENOUS
  Administered 2011-03-09: 50 mg via INTRAVENOUS

## 2011-03-09 MED ORDER — EPHEDRINE SULFATE 50 MG/ML IJ SOLN
INTRAMUSCULAR | Status: DC | PRN
  Administered 2011-03-09: 25 mg via INTRAVENOUS
  Administered 2011-03-09: 10 mg via INTRAVENOUS
  Administered 2011-03-09: 5 mg via INTRAVENOUS
  Administered 2011-03-09: 10 mg via INTRAVENOUS
  Administered 2011-03-09: 5 mg via INTRAVENOUS
  Administered 2011-03-09: 10 mg via INTRAVENOUS

## 2011-03-09 MED ORDER — METHOCARBAMOL 100 MG/ML IJ SOLN
500.0000 mg | Freq: Four times a day (QID) | INTRAVENOUS | Status: DC | PRN
  Filled 2011-03-09: qty 5

## 2011-03-09 MED ORDER — SUCCINYLCHOLINE CHLORIDE 20 MG/ML IJ SOLN
INTRAMUSCULAR | Status: DC | PRN
  Administered 2011-03-09: 100 mg via INTRAVENOUS

## 2011-03-09 MED ORDER — FERROUS SULFATE 325 (65 FE) MG PO TABS
325.0000 mg | ORAL_TABLET | Freq: Three times a day (TID) | ORAL | Status: DC
  Administered 2011-03-10 – 2011-03-12 (×8): 325 mg via ORAL
  Filled 2011-03-09 (×12): qty 1

## 2011-03-09 MED ORDER — HYDROCODONE-ACETAMINOPHEN 5-325 MG PO TABS: 1.0000 | ORAL_TABLET | ORAL | Status: DC | PRN

## 2011-03-09 MED ORDER — ADULT MULTIVITAMIN W/MINERALS CH
1.0000 | ORAL_TABLET | Freq: Every day | ORAL | Status: DC
  Administered 2011-03-09 – 2011-03-13 (×5): 1 via ORAL
  Filled 2011-03-09 (×5): qty 1

## 2011-03-09 MED ORDER — ONDANSETRON HCL 4 MG/2ML IJ SOLN: 4.0000 mg | Freq: Four times a day (QID) | INTRAMUSCULAR | Status: DC | PRN

## 2011-03-09 MED ORDER — HYDROMORPHONE HCL PF 1 MG/ML IJ SOLN
0.2500 mg | INTRAMUSCULAR | Status: DC | PRN
  Administered 2011-03-09 (×2): 0.5 mg via INTRAVENOUS

## 2011-03-09 MED ORDER — ASPIRIN 81 MG PO TABS
81.0000 mg | ORAL_TABLET | Freq: Every day | ORAL | Status: DC
  Filled 2011-03-09: qty 1

## 2011-03-09 MED ORDER — LIDOCAINE HCL (CARDIAC) 20 MG/ML IV SOLN
INTRAVENOUS | Status: DC | PRN
  Administered 2011-03-09: 100 mg via INTRAVENOUS

## 2011-03-09 MED ORDER — METHOCARBAMOL 500 MG PO TABS: 500.0000 mg | ORAL_TABLET | Freq: Four times a day (QID) | ORAL | Status: DC | PRN

## 2011-03-09 MED ORDER — SENNA 8.6 MG PO TABS
1.0000 | ORAL_TABLET | Freq: Two times a day (BID) | ORAL | Status: DC
  Administered 2011-03-09 – 2011-03-13 (×6): 8.6 mg via ORAL
  Filled 2011-03-09 (×8): qty 1

## 2011-03-09 MED ORDER — PHENOL 1.4 % MT LIQD: 1.0000 | OROMUCOSAL | Status: DC | PRN

## 2011-03-09 MED ORDER — INSULIN ASPART 100 UNIT/ML ~~LOC~~ SOLN
0.0000 [IU] | SUBCUTANEOUS | Status: DC
  Administered 2011-03-10 (×4): 2 [IU] via SUBCUTANEOUS
  Filled 2011-03-09 (×2): qty 3

## 2011-03-09 MED ORDER — DOXAZOSIN MESYLATE 8 MG PO TABS
8.0000 mg | ORAL_TABLET | Freq: Every day | ORAL | Status: DC
  Administered 2011-03-09 – 2011-03-13 (×5): 8 mg via ORAL
  Filled 2011-03-09 (×5): qty 1

## 2011-03-09 MED ORDER — RAMIPRIL 2.5 MG PO CAPS
2.5000 mg | ORAL_CAPSULE | Freq: Every day | ORAL | Status: DC
  Administered 2011-03-09 – 2011-03-10 (×2): 2.5 mg via ORAL
  Filled 2011-03-09 (×2): qty 1

## 2011-03-09 MED ORDER — NITROGLYCERIN 0.4 MG SL SUBL: 0.4000 mg | SUBLINGUAL_TABLET | SUBLINGUAL | Status: DC | PRN

## 2011-03-09 MED ORDER — CEFAZOLIN SODIUM 1-5 GM-% IV SOLN
INTRAVENOUS | Status: DC | PRN
  Administered 2011-03-09: 2 g via INTRAVENOUS

## 2011-03-09 MED ORDER — PIOGLITAZONE HCL 30 MG PO TABS
30.0000 mg | ORAL_TABLET | Freq: Every day | ORAL | Status: DC
  Administered 2011-03-09 – 2011-03-13 (×5): 30 mg via ORAL
  Filled 2011-03-09 (×5): qty 1

## 2011-03-09 MED ORDER — ENOXAPARIN SODIUM 40 MG/0.4ML ~~LOC~~ SOLN
40.0000 mg | SUBCUTANEOUS | Status: DC
  Administered 2011-03-10 – 2011-03-13 (×4): 40 mg via SUBCUTANEOUS
  Filled 2011-03-09 (×4): qty 0.4

## 2011-03-09 MED ORDER — LACTATED RINGERS IV SOLN: INTRAVENOUS | Status: DC

## 2011-03-09 MED ORDER — ONE-DAILY MULTI VITAMINS PO TABS: 1.0000 | ORAL_TABLET | Freq: Every day | ORAL | Status: DC

## 2011-03-09 MED ORDER — CEFAZOLIN SODIUM 1-5 GM-% IV SOLN
INTRAVENOUS | Status: AC
  Filled 2011-03-09: qty 100

## 2011-03-09 MED ORDER — FENTANYL CITRATE 0.05 MG/ML IJ SOLN
100.0000 ug | Freq: Once | INTRAMUSCULAR | Status: AC
  Administered 2011-03-09: 100 ug via INTRAVENOUS
  Filled 2011-03-09: qty 2

## 2011-03-09 MED ORDER — ONDANSETRON HCL 4 MG/2ML IJ SOLN
4.0000 mg | Freq: Four times a day (QID) | INTRAMUSCULAR | Status: DC | PRN
  Administered 2011-03-09: 4 mg via INTRAVENOUS
  Filled 2011-03-09: qty 2

## 2011-03-09 MED ORDER — LACTATED RINGERS IV SOLN
INTRAVENOUS | Status: DC | PRN
  Administered 2011-03-09: 18:00:00 via INTRAVENOUS

## 2011-03-09 SURGICAL SUPPLY — 47 items
BAG ZIPLOCK 12X15 (MISCELLANEOUS) ×2 IMPLANT
BLADE SAW SGTL 18X1.27X75 (BLADE) ×2 IMPLANT
CLOTH BEACON ORANGE TIMEOUT ST (SAFETY) ×2 IMPLANT
DERMABOND ADVANCED (GAUZE/BANDAGES/DRESSINGS) ×1
DERMABOND ADVANCED .7 DNX12 (GAUZE/BANDAGES/DRESSINGS) ×1 IMPLANT
DRAPE INCISE IOBAN 85X60 (DRAPES) ×2 IMPLANT
DRAPE ORTHO SPLIT 77X108 STRL (DRAPES) ×2
DRAPE POUCH INSTRU U-SHP 10X18 (DRAPES) ×2 IMPLANT
DRAPE SURG 17X11 SM STRL (DRAPES) ×2 IMPLANT
DRAPE SURG ORHT 6 SPLT 77X108 (DRAPES) ×2 IMPLANT
DRAPE U-SHAPE 47X51 STRL (DRAPES) ×2 IMPLANT
DRSG AQUACEL AG ADV 3.5X10 (GAUZE/BANDAGES/DRESSINGS) ×2 IMPLANT
DRSG MEPILEX BORDER 4X4 (GAUZE/BANDAGES/DRESSINGS) IMPLANT
DRSG MEPILEX BORDER 4X8 (GAUZE/BANDAGES/DRESSINGS) IMPLANT
DRSG TEGADERM 4X4.75 (GAUZE/BANDAGES/DRESSINGS) ×2 IMPLANT
DURAPREP 26ML APPLICATOR (WOUND CARE) ×2 IMPLANT
ELECT BLADE TIP CTD 4 INCH (ELECTRODE) ×2 IMPLANT
ELECT REM PT RETURN 9FT ADLT (ELECTROSURGICAL) ×2
ELECTRODE REM PT RTRN 9FT ADLT (ELECTROSURGICAL) ×1 IMPLANT
EVACUATOR 1/8 PVC DRAIN (DRAIN) ×2 IMPLANT
FACESHIELD LNG OPTICON STERILE (SAFETY) ×8 IMPLANT
GAUZE SPONGE 2X2 8PLY STRL LF (GAUZE/BANDAGES/DRESSINGS) ×1 IMPLANT
GLOVE BIOGEL PI IND STRL 7.5 (GLOVE) ×1 IMPLANT
GLOVE BIOGEL PI IND STRL 8 (GLOVE) ×1 IMPLANT
GLOVE BIOGEL PI INDICATOR 7.5 (GLOVE) ×1
GLOVE BIOGEL PI INDICATOR 8 (GLOVE) ×1
GLOVE ECLIPSE 8.0 STRL XLNG CF (GLOVE) IMPLANT
GLOVE ORTHO TXT STRL SZ7.5 (GLOVE) ×4 IMPLANT
GLOVE SURG ORTHO 8.0 STRL STRW (GLOVE) ×2 IMPLANT
GOWN STRL NON-REIN LRG LVL3 (GOWN DISPOSABLE) ×2 IMPLANT
HANDPIECE INTERPULSE COAX TIP (DISPOSABLE)
IMMOBILIZER KNEE 20 (SOFTGOODS)
IMMOBILIZER KNEE 20 THIGH 36 (SOFTGOODS) IMPLANT
KIT BASIN OR (CUSTOM PROCEDURE TRAY) ×2 IMPLANT
MANIFOLD NEPTUNE II (INSTRUMENTS) ×2 IMPLANT
PACK TOTAL JOINT (CUSTOM PROCEDURE TRAY) ×2 IMPLANT
POSITIONER SURGICAL ARM (MISCELLANEOUS) ×2 IMPLANT
SET HNDPC FAN SPRY TIP SCT (DISPOSABLE) IMPLANT
SPONGE GAUZE 2X2 STER 10/PKG (GAUZE/BANDAGES/DRESSINGS) ×1
STRIP CLOSURE SKIN 1/2X4 (GAUZE/BANDAGES/DRESSINGS) IMPLANT
SUT ETHIBOND NAB CT1 #1 30IN (SUTURE) ×2 IMPLANT
SUT MNCRL AB 4-0 PS2 18 (SUTURE) ×2 IMPLANT
SUT VIC AB 1 CT1 36 (SUTURE) ×6 IMPLANT
SUT VIC AB 2-0 CT1 27 (SUTURE) ×3
SUT VIC AB 2-0 CT1 TAPERPNT 27 (SUTURE) ×3 IMPLANT
TOWEL OR 17X26 10 PK STRL BLUE (TOWEL DISPOSABLE) ×4 IMPLANT
TRAY FOLEY CATH 14FRSI W/METER (CATHETERS) ×2 IMPLANT

## 2011-03-09 NOTE — Anesthesia Preprocedure Evaluation (Signed)
Anesthesia Evaluation  Patient identified by MRN, date of birth, ID band Patient awake  General Assessment Comment:Advanced years Ate 08:00  Reviewed: Allergy & Precautions, H&P , NPO status , Patient's Chart, lab work & pertinent test results, reviewed documented beta blocker date and time   Airway Mallampati: II TM Distance: >3 FB Neck ROM: Full    Dental   Pulmonary neg pulmonary ROS,  clear to auscultation        Cardiovascular hypertension, Pt. on medications + CAD Regular Normal CAD, s/p PTCA w/ stent X2 Currently asymptomatic   Neuro/Psych Negative Neurological ROS  Negative Psych ROS   GI/Hepatic negative GI ROS, Neg liver ROS,   Endo/Other  Diabetes mellitus-, Well Controlled, Type 2, Oral Hypoglycemic Agents  Renal/GU Cr 1.41   BPH    Musculoskeletal negative musculoskeletal ROS (+)   Abdominal   Peds negative pediatric ROS (+)  Hematology negative hematology ROS (+) Anemia- relative, Hgb 12.4   Anesthesia Other Findings   Reproductive/Obstetrics negative OB ROS                           Anesthesia Physical Anesthesia Plan  ASA: III and Emergent  Anesthesia Plan: General   Post-op Pain Management:    Induction: Intravenous, Rapid sequence and Cricoid pressure planned  Airway Management Planned: Oral ETT  Additional Equipment:   Intra-op Plan:   Post-operative Plan: Extubation in OR  Informed Consent: I have reviewed the patients History and Physical, chart, labs and discussed the procedure including the risks, benefits and alternatives for the proposed anesthesia with the patient or authorized representative who has indicated his/her understanding and acceptance.     Plan Discussed with: CRNA and Surgeon  Anesthesia Plan Comments:         Anesthesia Quick Evaluation

## 2011-03-09 NOTE — Consult Note (Signed)
Cardiology Consult Note   Patient ID: WENDY MIKLES MRN: [id number], DOB/AGE: [birth year]   Admit date: 03/09/2011 Date of Consult: 03/09/2011  Primary Physician: Thayer Headings, MD, MD Primary Cardiologist: Dr. Delane Ginger  Pt. Profile: Mr. Bonawitz is a pleasant 78 yo male with PMHx significant for CAD (s/p stents x 2 to RCA 10/2004 in [place], [place]; Echo 03/2005 50% EF, mild inferior wall hypokinesis and mild-moderate LV wall thickness, moderate LA dilation; normal stress test approx 06/2007), type 2 DM, HTN, HL and CKD who fell earlier today and unfortunately fractured his left femoral neck.   Reason for consult: Preop evaluation  Problem List: Past Medical History  Diagnosis Date  . Coronary artery disease   . Hyperlipidemia   . Hypertension   . Diabetes mellitus   . BPH (benign prostatic hypertrophy)     Past Surgical History  Procedure Date  . Coronary angioplasty with stent placement 10/2004    stenting x 2 to RCA  . Hernia repair   . Cataract extraction, bilateral   . Cardiovascular stress test 07/01/2007    EF 74%     Allergies: No Known Allergies  HPI:   He reports waking up this morning to use the restroom and suddenly his R knee "gave out." He fell onto his L hip. He denies lightheadedness, dizziness, weakness or syncope. He was transported to [facility] ED for further evaluation where x-ray revealed subcapital left femoral neck fracture. He is scheduled to undergo surgical repair today pending cardiology evaluation.   He reports having an MI 6 years ago and having two stents placed. He follows Dr. Elease Hashimoto. Since then, he has undergone one stress test which was normal. He did mention one episode of TIA approx 6 weeks post-cath, without further incidences since. He has been relatively asymptomatic since PCI denying chest pain, diaphoresis, DOE, palpitations, lightheadedness, SOB, orthopnea, PND, LE edema. He states he follows his PCP regularly with normal cholesterol and BP  well-controlled around 120/60 which he checks consistently. He states he is glucose intolerant, with FBG in the low 100s. He takes his medications regularly including Lipitor, ASA, Toprol-XL, Actos, Ramipril, Maxzide, Doxazosin. He is quite active excercising 3x times per week at the [facility], mostly aerobic activities. He drinks 2 glasses of red wine nightly, however denies tobacco use.   EKG in the ED reveals NSR at 60 bpm, with nonspecific T wave changes in V1 and III. CXR reveals COPD and chronic interstitial change with stable cardiomegaly. He is in a moderate amount of pain, but otherwise in no acute distress.   Inpatient Medications:     . fentaNYL      . fentaNYL      . fentaNYL  100 mcg Intravenous Once  . fentaNYL  100 mcg Intravenous Once  . insulin aspart  0-9 Units Subcutaneous Q4H  . senna  1 tablet Oral BID  . DISCONTD: fentaNYL  100 mcg Intravenous Once    (Not in a hospital admission)  Family History  Problem Relation Age of Onset  . Lung cancer Father   . Lung cancer Brother   . Hypertension Mother      History   Social History  . Marital Status: Married    Spouse Name: N/A    Number of Children: N/A  . Years of Education: N/A   Occupational History  . Retired Other   Social History Main Topics  . Smoking status: Former Smoker    Quit date: 03/06/1971  . Smokeless tobacco: Never  Used  . Alcohol Use: 6.0 - 8.4 oz/week    10-14 Glasses of wine per week  . Drug Use: No  . Sexually Active: No   Other Topics Concern  . Not on file   Social History Narrative   Lives in [place], [place] with wife. Has 3 children.      Review of Systems: General: negative for chills, fever, night sweats or weight changes.  Cardiovascular: negative for chest pain, dyspnea on exertion, edema, orthopnea, palpitations, paroxysmal nocturnal dyspnea or shortness of breath Dermatological: negative for rash Respiratory: negative for cough or wheezing Urologic: negative for  hematuria Abdominal: negative for nausea, vomiting, diarrhea, bright red blood per rectum, melena, or hematemesis Neurologic: negative for visual changes, syncope, or dizziness All other systems reviewed and are otherwise negative except as noted above.  Physical Exam: Blood pressure 116/72, pulse 77, temperature 98.3 F (36.8 C), temperature source Oral, resp. rate 21, SpO2 99.00%.   General: Well developed, well nourished, NAD Head: Normocephalic, atraumatic, sclera non-icteric, no xanthomas Neck: Negative for carotid bruits. JVD not elevated. Supple.  Lungs: Clear bilaterally to auscultation without wheezes, rales, or rhonchi. Breathing is unlabored. Heart: RRR with S1 S2. No murmurs, rubs, or gallops appreciated. Abdomen: Soft, non-tender, non-distended with normoactive bowel sounds. No hepatomegaly. No rebound/guarding. No obvious abdominal masses. Msk:  Strength and tone appears normal for age. Extremities: Tenderness to palpation of R hip. Unable to move R leg due to pain. Sensation grossly in tact. No clubbing, cyanosis or edema.  Distal pedal pulses are 2+ and equal bilaterally. Neuro: Alert and oriented X 3. Moves all extremities spontaneously. Psych:  Responds to questions appropriately with a normal affect.  Labs: Recent Labs  Basename 03/09/11 1010   WBC 7.8   HGB 12.4*   HCT 37.1*   MCV 95.4   PLT 278    Lab 03/09/11 1010  NA 136  K 4.3  CL 101  CO2 24  BUN 24*  CREATININE 1.41*  CALCIUM 9.6  PROT --  BILITOT --  ALKPHOS --  ALT --  AST --  AMYLASE --  LIPASE --  GLUCOSE 100*    Radiology/Studies: Dg Chest 2 View  03/09/2011  *RADIOLOGY REPORT*  Clinical Data: Recent fall, left hip pain  CHEST - 2 VIEW  Comparison: Chest x-ray of 05/18/2008  Findings: The lungs remain hyperaerated consistent with COPD with chronic interstitial change present.  No active infiltrate or effusion is seen.  Cardiomegaly is stable.  The bones are osteopenic.  No compression  deformity is noted.  IMPRESSION: COPD and chronic interstitial change.  Stable cardiomegaly.  Original Report Authenticated By: Juline Patch, M.D.   Dg Hip Complete Left  03/09/2011  *RADIOLOGY REPORT*  Clinical Data: Fall and left hip pain.  LEFT HIP - COMPLETE 2+ VIEW  Comparison: None.  Findings: AP view of the pelvis and two views of the left hip were obtained.  There is a fracture of the left femoral neck.  This fracture is displaced and appears to extend into the subcapital region along the lateral aspect of the fracture.  The left femoral head is located.  The patient has prominent vascular calcifications.  No gross abnormality to the right hip on the pelvic view. The pubic rami appear to be intact.  IMPRESSION: Displaced left femoral neck fracture.  Subcapital component along the lateral aspect of fracture.  Original Report Authenticated By: Richarda Overlie, M.D.    EKG: NSR, 60 bpm, nonspecific T wave changes  in V1, III  Tele: NSR, 60-70 bpm, trace PVCs  ASSESSMENT AND PLAN:    Mr. Haynie is a pleasant 78 yo male with PMHx significant for CAD (s/p stents x 2 to RCA, normal stress test approx 2-3 years ago), type 2 DM, HTN, HL and CKD who fell earlier today and unfortunately fractured his left femoral neck. From a cardiac perspective, he has done remarkably well since undergoing PCI. He has been proactively engaging in risk reduction strategies and prevention with good cholesterol, BP and blood glucose control. He exercises regularly, follows up with his PCP and cardiology (Dr. Elease Hashimoto) on a consistent basis and is compliant with medications. He denies ischemic symptoms, evidence of heart failure or arrhythmia since PCI. Would benefit from echo results as the most recent I am finding was from 2007 (50% EF, mild inferior wall hypokinesis and mild-moderate LV wall thickness). Otherwise he is stable from a cardiac standpoint and the benefits outweigh the risks to undergo surgical repair of his femoral  fracture.   Signed, R. Hurman Horn, PA-C 03/09/2011, 5:19 PM  Patient seen and examined.  Agree with note and findings of Mr. Shaune Spittle.  He is currently in the holding area awaiting surgery.  He has been active, easily completing 4 mets and greater activity at the downtown [facility] on a regular basis.  His stents  (Cypher) were put in in the setting of AMI, and he has been maintained on ASA, but not clopidogrel.  I have spoken with Dr. Charlann Boxer, and emphasized the need for continuation of his antiplatelet therapy through and after surgery, so ASA should be continued.  His ECG is not acute.    We will follow with you in the post operative period.  Shawnie Pons 6:23 PM 03/09/2011

## 2011-03-09 NOTE — ED Notes (Signed)
C/o pain returning--Fentanyl 100 mcg given IVP per order Dr. Manus Gunning.

## 2011-03-09 NOTE — ED Notes (Signed)
Taken to OR via stretcher--Stable.

## 2011-03-09 NOTE — ED Notes (Signed)
Very restless and appears unable to attain position of comfort--Rates left hip pain a 9 on 1-10 scale.

## 2011-03-09 NOTE — ED Notes (Signed)
Sustained injury to left hip/pelvis after falling this a.m.---No obvious rotation or deformity--+ pedal pulses, uniform color and warmth.

## 2011-03-09 NOTE — Transfer of Care (Signed)
Immediate Anesthesia Transfer of Care Note  Patient: Herbert Marquez  Procedure(s) Performed:  ARTHROPLASTY BIPOLAR HIP  Patient Location: PACU  Anesthesia Type: General  Level of Consciousness: awake, alert , oriented and patient cooperative  Airway & Oxygen Therapy: Patient Spontanous Breathing and Patient connected to face mask oxygen  Post-op Assessment: Report given to PACU RN and Post -op Vital signs reviewed and stable  Post vital signs: Reviewed and stable  Complications: No apparent anesthesia complications

## 2011-03-09 NOTE — Progress Notes (Signed)
Echocardiogram 2D Echocardiogram has been performed.  Jeryl Columbia R 03/09/2011, 3:20 PM

## 2011-03-09 NOTE — ED Notes (Signed)
C/o Pain left hip returning----MD notified and order received for additional pain med.

## 2011-03-09 NOTE — H&P (Signed)
PCP:   Thayer Headings, MD, MD   Chief Complaint:  Larey Seat and broke his hip today.  HPI: Mr Saye is a pleasant 78 year old gentleman, who fell on his way from the bathroom this morning. He tripped and fell but had otherwise been doing well prior to today. He apparently mis stepped.  In Ed he was found to have displaced subcapital left femoral neck fracture. He has been referred to the hospitalist service due to his multiple comorbids, which include CAD s/p coronary stents about 6 years ago. He sees Dr Elease Hashimoto and says last saw him about 2 months ago. Last stress test about 3 years ago per his report. Reports no exertional angina. No other complaints.  Review of Systems:  Unremarkable except as highlighted above.  Past Medical History: Past Medical History  Diagnosis Date  . Coronary artery disease   . Hyperlipidemia   . Hypertension   . Diabetes mellitus   . BPH (benign prostatic hypertrophy)    Past Surgical History  Procedure Date  . Coronary angioplasty with stent placement     RCA  . Hernia repair   . Cataract extraction, bilateral   . Cardiovascular stress test 07/01/2007    EF 74%    Medications: Prior to Admission medications   Medication Sig Start Date End Date Taking? Authorizing Provider  aspirin 81 MG tablet Take 81 mg by mouth daily.     Yes Historical Provider, MD  atorvastatin (LIPITOR) 40 MG tablet Take 40 mg by mouth daily.    Yes Historical Provider, MD  diphenhydrAMINE (BENADRYL) 25 MG tablet Take 25 mg by mouth every 6 (six) hours as needed.     Yes Historical Provider, MD  doxazosin (CARDURA) 4 MG tablet Take 8 mg by mouth daily.     Yes Historical Provider, MD  metoprolol (TOPROL-XL) 100 MG 24 hr tablet Take 50 mg by mouth daily.    Yes Historical Provider, MD  Multiple Vitamin (MULTIVITAMIN) tablet Take 1 tablet by mouth daily.     Yes Historical Provider, MD  omeprazole (PRILOSEC) 20 MG capsule Take 20 mg by mouth daily.     Yes Historical Provider, MD    pioglitazone (ACTOS) 30 MG tablet Take 30 mg by mouth daily.     Yes Historical Provider, MD  ramipril (ALTACE) 2.5 MG capsule Take 2.5 mg by mouth daily.     Yes Historical Provider, MD  triamterene-hydrochlorothiazide (MAXZIDE) 75-50 MG per tablet TAKE ONE-HALF TABLET BY MOUTH EVERY DAY 09/15/10  Yes Elyn Aquas., MD  nitroGLYCERIN (NITROSTAT) 0.4 MG SL tablet Place 1 tablet (0.4 mg total) under the tongue every 5 (five) minutes as needed. 09/20/10   Elyn Aquas., MD    Allergies:  No Known Allergies  Social History:  reports that he quit smoking about 40 years ago. He has never used smokeless tobacco. He reports that he does not drink alcohol or use illicit drugs.   Family History: Family History  Problem Relation Age of Onset  . Lung cancer Father   . Lung cancer Brother     Physical Exam: Filed Vitals:   03/09/11 0937 03/09/11 1215  BP: 119/69 106/67  Pulse: 60 62  Temp: 97.5 F (36.4 C) 98.5 F (36.9 C)  TempSrc: Oral Oral  Resp: 18 11  SpO2: 99% 96%   Not in distress, lying comfortably in bed. HEENT- short neck, no carotid bruits. RS- lungs clear. CVS- S1S2.RRR. No murmurs. Abdomen- obese, soft, non tender. +BS. No palpable  organomegaly. CNS- grossly intact. Extremities- no pedal edema. Peripheral pulses equal.   Labs on Admission:   Basename 03/09/11 1010  NA 136  K 4.3  CL 101  CO2 24  GLUCOSE 100*  BUN 24*  CREATININE 1.41*  CALCIUM 9.6  MG --  PHOS --   No results found for this basename: AST:2,ALT:2,ALKPHOS:2,BILITOT:2,PROT:2,ALBUMIN:2 in the last 72 hours No results found for this basename: LIPASE:2,AMYLASE:2 in the last 72 hours  Basename 03/09/11 1010  WBC 7.8  NEUTROABS 5.9  HGB 12.4*  HCT 37.1*  MCV 95.4  PLT 278   No results found for this basename: CKTOTAL:3,CKMB:3,CKMBINDEX:3,TROPONINI:3 in the last 72 hours No results found for this basename: TSH,T4TOTAL,FREET3,T3FREE,THYROIDAB in the last 72 hours No results found for  this basename: VITAMINB12:2,FOLATE:2,FERRITIN:2,TIBC:2,IRON:2,RETICCTPCT:2 in the last 72 hours  Radiological Exams on Admission: Dg Chest 2 View  03/09/2011  *RADIOLOGY REPORT*  Clinical Data: Recent fall, left hip pain  CHEST - 2 VIEW  Comparison: Chest x-ray of 05/18/2008  Findings: The lungs remain hyperaerated consistent with COPD with chronic interstitial change present.  No active infiltrate or effusion is seen.  Cardiomegaly is stable.  The bones are osteopenic.  No compression deformity is noted.  IMPRESSION: COPD and chronic interstitial change.  Stable cardiomegaly.  Original Report Authenticated By: Juline Patch, M.D.   Dg Hip Complete Left  03/09/2011  *RADIOLOGY REPORT*  Clinical Data: Fall and left hip pain.  LEFT HIP - COMPLETE 2+ VIEW  Comparison: None.  Findings: AP view of the pelvis and two views of the left hip were obtained.  There is a fracture of the left femoral neck.  This fracture is displaced and appears to extend into the subcapital region along the lateral aspect of the fracture.  The left femoral head is located.  The patient has prominent vascular calcifications.  No gross abnormality to the right hip on the pelvic view. The pubic rami appear to be intact.  IMPRESSION: Displaced left femoral neck fracture.  Subcapital component along the lateral aspect of fracture.  Original Report Authenticated By: Richarda Overlie, M.D.    Assessment Pleasant 78 year old male with multiple comorbids who had an mechanical fall, first such, resulting in acute left femoral neck fracture. He has CAD/DM2/Htn, apparently ckd as well. Needs cardiology risk stratification. Plan .Fracture of femoral neck, left- Dr Cornelius Moras aware of patient. Defer management to him. May need STR. .ARF (acute renal failure)- check random urine Na/cr. Hold acei/diurtics for now. .DM (diabetes mellitus)- ssi/hba1c. .Obesity- life style changes. Marland KitchenCOPD (chronic obstructive pulmonary disease)- stable. Albuterol inhalations  prn. .Essential hypertension/cad-  BP seems controlled. Continue home meds including beta blocker. Hold acei/diuretic in view of renal function.   Kenard Morawski [phone] 03/09/2011, 12:33 PM

## 2011-03-09 NOTE — Anesthesia Postprocedure Evaluation (Signed)
  Anesthesia Post-op Note  Patient: Herbert Marquez  Procedure(s) Performed:  ARTHROPLASTY BIPOLAR HIP  Patient Location: PACU  Anesthesia Type: General  Level of Consciousness: oriented and sedated  Airway and Oxygen Therapy: Patient Spontanous Breathing and Patient connected to nasal cannula oxygen  Post-op Pain: mild  Post-op Assessment: Post-op Vital signs reviewed, Patient's Cardiovascular Status Stable, Respiratory Function Stable and Patent Airway  Post-op Vital Signs: stable  Complications: No apparent anesthesia complications

## 2011-03-09 NOTE — Preoperative (Signed)
Beta Blockers   Reason not to administer Beta Blockers:Pt stated took 100mg  Toprol last pm

## 2011-03-09 NOTE — ED Provider Notes (Signed)
History     CSN: [id number]  Arrival date & time 03/09/11  4696   First MD Initiated Contact with Patient 03/09/11 0935      Chief Complaint  Patient presents with  . Hip Pain    (Consider location/radiation/quality/duration/timing/severity/associated sxs/prior treatment) HPI Comments: Patient presents with left hip pain after mechanical fall. He tripped and went on his left hip denies hitting his head or losing consciousness. Denies any chest pain, shortness of breath, dizziness, lightheadedness, nausea or vomiting. He takes a baby aspirin but is not on anticoagulation. Is a history of CAD with stents.  He is unable to bear weight after the fall. His only complaint is pain in left hip. Denies abdominal pain, back pain, weakness, numbness or tingling.  Patient is a 78 y.o. male presenting with hip pain. The history is provided by the patient.  Hip Pain This is a new problem. The current episode started less than 1 hour ago. The problem occurs constantly. The problem has not changed since onset.Pertinent negatives include no chest pain, no abdominal pain, no headaches and no shortness of breath. The symptoms are aggravated by walking. The symptoms are relieved by nothing. He has tried nothing for the symptoms.    Past Medical History  Diagnosis Date  . Coronary artery disease   . Hyperlipidemia   . Hypertension   . Diabetes mellitus   . BPH (benign prostatic hypertrophy)     Past Surgical History  Procedure Date  . Coronary angioplasty with stent placement     RCA  . Hernia repair   . Cataract extraction, bilateral   . Cardiovascular stress test 07/01/2007    EF 74%    Family History  Problem Relation Age of Onset  . Lung cancer Father   . Lung cancer Brother     History  Substance Use Topics  . Smoking status: Former Smoker    Quit date: 03/06/1971  . Smokeless tobacco: Never Used  . Alcohol Use: No      Review of Systems  Constitutional: Negative for fever,  activity change and appetite change.  HENT: Negative for congestion and rhinorrhea.   Respiratory: Negative for cough and shortness of breath.   Cardiovascular: Negative for chest pain.  Gastrointestinal: Negative for nausea, vomiting and abdominal pain.  Genitourinary: Negative for dysuria and hematuria.  Musculoskeletal: Positive for myalgias, arthralgias and gait problem. Negative for back pain.  Neurological: Negative for syncope and headaches.    Allergies  Review of patient's allergies indicates no known allergies.  Home Medications   Current Outpatient Rx  Name Route Sig Dispense Refill  . ASPIRIN 81 MG PO TABS Oral Take 81 mg by mouth daily.      . ATORVASTATIN CALCIUM 40 MG PO TABS Oral Take 40 mg by mouth daily.     [name] DIPHENHYDRAMINE HCL 25 MG PO TABS Oral Take 25 mg by mouth every 6 (six) hours as needed.      [name] DOXAZOSIN MESYLATE 4 MG PO TABS Oral Take 8 mg by mouth daily.      [name] METOPROLOL SUCCINATE ER 100 MG PO TB24 Oral Take 50 mg by mouth daily.     [name] ONE-DAILY MULTI VITAMINS PO TABS Oral Take 1 tablet by mouth daily.      [name] OMEPRAZOLE 20 MG PO CPDR Oral Take 20 mg by mouth daily.      [name] PIOGLITAZONE HCL 30 MG PO TABS Oral Take 30 mg by mouth daily.      [name]  RAMIPRIL 2.5 MG PO CAPS Oral Take 2.5 mg by mouth daily.      . TRIAMTERENE-HCTZ 75-50 MG PO TABS  TAKE ONE-HALF TABLET BY MOUTH EVERY DAY 15 tablet 3  . NITROGLYCERIN 0.4 MG SL SUBL Sublingual Place 1 tablet (0.4 mg total) under the tongue every 5 (five) minutes as needed. 25 tablet 12    BP 106/67  Pulse 62  Temp(Src) 98.5 F (36.9 C) (Oral)  Resp 11  SpO2 96%  Physical Exam  Constitutional: He is oriented to person, place, and time. He appears well-developed and well-nourished. No distress.       Distress from pain  HENT:  Head: Normocephalic and atraumatic.  Mouth/Throat: Oropharynx is clear and moist. No oropharyngeal exudate.  Eyes: Conjunctivae are normal. Pupils are equal, round, and reactive to  light.  Neck: Normal range of motion. Neck supple.       No C-spine pain, step-off or deformity  Cardiovascular: Normal rate, regular rhythm and normal heart sounds.   Pulmonary/Chest: Effort normal and breath sounds normal. No respiratory distress.  Abdominal: Soft. There is no tenderness. There is no rebound and no guarding.       Soft and nontender  Musculoskeletal: Normal range of motion. He exhibits tenderness.       Tenderness to palpation the left greater trochanter area. Every since then. +2 femoral pulses bilaterally +2 DP and PT pulses bilaterally. no obvious deformity or external rotation or shortening No T. or L-spine pain  Neurological: He is alert and oriented to person, place, and time. No cranial nerve deficit.  Skin: Skin is warm.    ED Course  Procedures (including critical care time)  Labs Reviewed  CBC - Abnormal; Notable for the following:    RBC 3.89 (*)    Hemoglobin 12.4 (*)    HCT 37.1 (*)    All other components within normal limits  BASIC METABOLIC PANEL - Abnormal; Notable for the following:    Glucose, Bld 100 (*)    BUN 24 (*)    Creatinine, Ser 1.41 (*)    GFR calc non Af Amer 45 (*)    GFR calc Af Amer 52 (*)    All other components within normal limits  URINALYSIS, ROUTINE W REFLEX MICROSCOPIC - Abnormal; Notable for the following:    Ketones, ur TRACE (*)    All other components within normal limits  DIFFERENTIAL  PROTIME-INR  TYPE AND SCREEN  PREPARE RBC (CROSSMATCH)  ABO/RH  PROTIME-INR  APTT  HEMOGLOBIN A1C  POCT CBG MONITORING  SODIUM, URINE, RANDOM  CREATININE, URINE, RANDOM  TSH  URINE CULTURE   Dg Chest 2 View  03/09/2011  *RADIOLOGY REPORT*  Clinical Data: Recent fall, left hip pain  CHEST - 2 VIEW  Comparison: Chest x-ray of 05/18/2008  Findings: The lungs remain hyperaerated consistent with COPD with chronic interstitial change present.  No active infiltrate or effusion is seen.  Cardiomegaly is stable.  The bones are  osteopenic.  No compression deformity is noted.  IMPRESSION: COPD and chronic interstitial change.  Stable cardiomegaly.  Original Report Authenticated By: Juline Patch, M.D.   Dg Hip Complete Left  03/09/2011  *RADIOLOGY REPORT*  Clinical Data: Fall and left hip pain.  LEFT HIP - COMPLETE 2+ VIEW  Comparison: None.  Findings: AP view of the pelvis and two views of the left hip were obtained.  There is a fracture of the left femoral neck.  This fracture is displaced and appears to extend  into the subcapital region along the lateral aspect of the fracture.  The left femoral head is located.  The patient has prominent vascular calcifications.  No gross abnormality to the right hip on the pelvic view. The pubic rami appear to be intact.  IMPRESSION: Displaced left femoral neck fracture.  Subcapital component along the lateral aspect of fracture.  Original Report Authenticated By: Richarda Overlie, M.D.     1. Femoral neck fracture       MDM  Mechanical fall left hip pain. Unable to bear weight, neurovascularly intact.  Pain control, x-rays.  Isolated left femoral neck fracture. Neurovascular intact, pain control.  D/w Dr. Lestine Box of ortho.  Given CAD with stents, DM, HTN, would like medical admission.  [facility] cardiology [facility] patient) consulted at request of Dr. Charlann Boxer for preop clearance.      Glynn Octave, MD 03/09/11 1452

## 2011-03-09 NOTE — Consult Note (Signed)
Reason for Consult:Left Femoral Neck Fracture Referring Physician: ER, Hospitalist  JEFF FRIEDEN is an 78 y.o. male.  HPI: 78yo male fell at home today when his knee gave way.  He landed on his left hip on hard wood floors.  He had immediate onset pain and was brought to the ER for evaluation.  Xrays revealed a femoral neck fracture.  Ortho was consulted as patient was admitted to medical service.  Past Medical History  Diagnosis Date  . Coronary artery disease   . Hyperlipidemia   . Hypertension   . Diabetes mellitus   . BPH (benign prostatic hypertrophy)   . TIA (transient ischemic attack)     Approximately 6 weeks post-cardiac catheterization.     Past Surgical History  Procedure Date  . Coronary angioplasty with stent placement 10/2004    stenting x 2 to RCA  . Hernia repair   . Cataract extraction, bilateral   . Cardiovascular stress test 07/01/2007    EF 74%    Family History  Problem Relation Age of Onset  . Lung cancer Father   . Lung cancer Brother   . Hypertension Mother     Social History:  reports that he quit smoking about 40 years ago. He has never used smokeless tobacco. He reports that he drinks about 6 - 8.4 ounces of alcohol per week. He reports that he does not use illicit drugs.  Allergies: No Known Allergies  Medications: I have reviewed the patient's current medications.  Results for orders placed during the hospital encounter of 03/09/11 (from the past 24 hour(s))  CBC     Status: Abnormal   Collection Time   03/09/11 10:10 AM      Component Value Range   WBC 7.8  4.0 - 10.5 (K/uL)   RBC 3.89 (*) 4.22 - 5.81 (MIL/uL)   Hemoglobin 12.4 (*) 13.0 - 17.0 (g/dL)   HCT 57.8 (*) 46.9 - 52.0 (%)   MCV 95.4  78.0 - 100.0 (fL)   MCH 31.9  26.0 - 34.0 (pg)   MCHC 33.4  30.0 - 36.0 (g/dL)   RDW 62.9  52.8 - 41.3 (%)   Platelets 278  150 - 400 (K/uL)  DIFFERENTIAL     Status: Normal   Collection Time   03/09/11 10:10 AM      Component Value Range   Neutrophils Relative 77  43 - 77 (%)   Neutro Abs 5.9  1.7 - 7.7 (K/uL)   Lymphocytes Relative 13  12 - 46 (%)   Lymphs Abs 1.0  0.7 - 4.0 (K/uL)   Monocytes Relative 8  3 - 12 (%)   Monocytes Absolute 0.6  0.1 - 1.0 (K/uL)   Eosinophils Relative 2  0 - 5 (%)   Eosinophils Absolute 0.1  0.0 - 0.7 (K/uL)   Basophils Relative 0  0 - 1 (%)   Basophils Absolute 0.0  0.0 - 0.1 (K/uL)  BASIC METABOLIC PANEL     Status: Abnormal   Collection Time   03/09/11 10:10 AM      Component Value Range   Sodium 136  135 - 145 (mEq/L)   Potassium 4.3  3.5 - 5.1 (mEq/L)   Chloride 101  96 - 112 (mEq/L)   CO2 24  19 - 32 (mEq/L)   Glucose, Bld 100 (*) 70 - 99 (mg/dL)   BUN 24 (*) 6 - 23 (mg/dL)   Creatinine, Ser 2.44 (*) 0.50 - 1.35 (mg/dL)   Calcium 9.6  8.4 - 10.5 (mg/dL)   GFR calc non Af Amer 45 (*) >90 (mL/min)   GFR calc Af Amer 52 (*) >90 (mL/min)  PROTIME-INR     Status: Normal   Collection Time   03/09/11 10:10 AM      Component Value Range   Prothrombin Time 14.6  11.6 - 15.2 (seconds)   INR 1.12  0.00 - 1.49   TYPE AND SCREEN     Status: Normal (Preliminary result)   Collection Time   03/09/11 11:47 AM      Component Value Range   ABO/RH(D) A POS     Antibody Screen NEG     Sample Expiration 03/12/2011     Unit Number [id number]     Blood Component Type RED CELLS,LR     Unit division 00     Status of Unit ALLOCATED     Transfusion Status OK TO TRANSFUSE     Crossmatch Result Compatible     Unit Number [id number]     Blood Component Type RED CELLS,LR     Unit division 00     Status of Unit ALLOCATED     Transfusion Status OK TO TRANSFUSE     Crossmatch Result Compatible    PREPARE RBC (CROSSMATCH)     Status: Normal   Collection Time   03/09/11 12:00 PM      Component Value Range   Order Confirmation ORDER PROCESSED BY BLOOD BANK    ABO/RH     Status: Normal   Collection Time   03/09/11 12:00 PM      Component Value Range   ABO/RH(D) A POS    SODIUM, URINE, RANDOM     Status:  Normal   Collection Time   03/09/11 12:57 PM      Component Value Range   Sodium, Ur 118    CREATININE, URINE, RANDOM     Status: Normal   Collection Time   03/09/11 12:57 PM      Component Value Range   Creatinine, Urine 130.37    URINALYSIS, ROUTINE W REFLEX MICROSCOPIC     Status: Abnormal   Collection Time   03/09/11 12:57 PM      Component Value Range   Color, Urine YELLOW  YELLOW    APPearance CLEAR  CLEAR    Specific Gravity, Urine 1.020  1.005 - 1.030    pH 5.5  5.0 - 8.0    Glucose, UA NEGATIVE  NEGATIVE (mg/dL)   Hgb urine dipstick NEGATIVE  NEGATIVE    Bilirubin Urine NEGATIVE  NEGATIVE    Ketones, ur TRACE (*) NEGATIVE (mg/dL)   Protein, ur NEGATIVE  NEGATIVE (mg/dL)   Urobilinogen, UA 0.2  0.0 - 1.0 (mg/dL)   Nitrite NEGATIVE  NEGATIVE    Leukocytes, UA NEGATIVE  NEGATIVE   PROTIME-INR     Status: Normal   Collection Time   03/09/11  1:20 PM      Component Value Range   Prothrombin Time 14.9  11.6 - 15.2 (seconds)   INR 1.15  0.00 - 1.49   APTT     Status: Normal   Collection Time   03/09/11  1:20 PM      Component Value Range   aPTT 33  24 - 37 (seconds)  GLUCOSE, CAPILLARY     Status: Normal   Collection Time   03/09/11  4:00 PM      Component Value Range   Glucose-Capillary 97  70 - 99 (mg/dL)  X-ray: Varus angulated midcervical femoral neck fracture  No recent episodes of chest pain cough fever chills  Blood pressure 116/72, pulse 77, temperature 98.3 F (36.8 C), temperature source Oral, resp. rate 21, SpO2 99.00%.  Awake alert oriented Pain with movement of left hip otherwise NVI LLE  Assessment/Plan: Left femoral neck fracture  To OR for left hip hemiarthroplasty Post therapy Will maintain Aspirin due to drug eluting stent  Herbert Marquez D 03/09/2011, 5:46 PM

## 2011-03-10 DIAGNOSIS — E871 Hypo-osmolality and hyponatremia: Secondary | ICD-10-CM | POA: Diagnosis not present

## 2011-03-10 LAB — COMPREHENSIVE METABOLIC PANEL
ALT: 13 U/L (ref 0–53)
AST: 32 U/L (ref 0–37)
Albumin: 2.9 g/dL — ABNORMAL LOW (ref 3.5–5.2)
Alkaline Phosphatase: 60 U/L (ref 39–117)
BUN: 17 mg/dL (ref 6–23)
CO2: 20 mEq/L (ref 19–32)
Calcium: 8.4 mg/dL (ref 8.4–10.5)
Chloride: 100 mEq/L (ref 96–112)
Creatinine, Ser: 1.07 mg/dL (ref 0.50–1.35)
GFR calc Af Amer: 73 mL/min — ABNORMAL LOW (ref >90)
GFR calc non Af Amer: 63 mL/min — ABNORMAL LOW (ref >90)
Glucose, Bld: 181 mg/dL — ABNORMAL HIGH (ref 70–99)
Potassium: 4.5 mEq/L (ref 3.5–5.1)
Sodium: 130 mEq/L — ABNORMAL LOW (ref 135–145)
Total Bilirubin: 0.3 mg/dL (ref 0.3–1.2)
Total Protein: 5.9 g/dL — ABNORMAL LOW (ref 6.0–8.3)

## 2011-03-10 LAB — CBC
HCT: 31.1 % — ABNORMAL LOW (ref 39.0–52.0)
Hemoglobin: 10.4 g/dL — ABNORMAL LOW (ref 13.0–17.0)
MCH: 32.1 pg (ref 26.0–34.0)
MCHC: 33.4 g/dL (ref 30.0–36.0)
MCV: 96 fL (ref 78.0–100.0)
Platelets: 210 10*3/uL (ref 150–400)
RBC: 3.24 MIL/uL — ABNORMAL LOW (ref 4.22–5.81)
RDW: 14.5 % (ref 11.5–15.5)
WBC: 12 10*3/uL — ABNORMAL HIGH (ref 4.0–10.5)

## 2011-03-10 LAB — GLUCOSE, CAPILLARY
Glucose-Capillary: 137 mg/dL — ABNORMAL HIGH (ref 70–99)
Glucose-Capillary: 149 mg/dL — ABNORMAL HIGH (ref 70–99)
Glucose-Capillary: 174 mg/dL — ABNORMAL HIGH (ref 70–99)
Glucose-Capillary: 179 mg/dL — ABNORMAL HIGH (ref 70–99)
Glucose-Capillary: 179 mg/dL — ABNORMAL HIGH (ref 70–99)
Glucose-Capillary: 189 mg/dL — ABNORMAL HIGH (ref 70–99)
Glucose-Capillary: 189 mg/dL — ABNORMAL HIGH (ref 70–99)

## 2011-03-10 LAB — APTT: aPTT: 31 seconds (ref 24–37)

## 2011-03-10 LAB — PROTIME-INR
INR: 1.15 (ref 0.00–1.49)
Prothrombin Time: 14.9 seconds (ref 11.6–15.2)

## 2011-03-10 LAB — HEMOGLOBIN A1C
Hgb A1c MFr Bld: 5.9 % — ABNORMAL HIGH (ref <5.7)
Mean Plasma Glucose: 123 mg/dL — ABNORMAL HIGH (ref <117)

## 2011-03-10 MED ORDER — SODIUM CHLORIDE 0.9 % IV BOLUS (SEPSIS)
250.0000 mL | Freq: Once | INTRAVENOUS | Status: AC
  Administered 2011-03-10: 250 mL via INTRAVENOUS

## 2011-03-10 MED ORDER — OMEPRAZOLE 20 MG PO CPDR
20.0000 mg | DELAYED_RELEASE_CAPSULE | Freq: Every day | ORAL | Status: DC
  Administered 2011-03-10 – 2011-03-13 (×4): 20 mg via ORAL
  Filled 2011-03-10 (×4): qty 1

## 2011-03-10 MED ORDER — NON FORMULARY: 20.0000 mg | Freq: Every day | Status: DC

## 2011-03-10 MED ORDER — INSULIN ASPART 100 UNIT/ML ~~LOC~~ SOLN
0.0000 [IU] | Freq: Three times a day (TID) | SUBCUTANEOUS | Status: DC
  Administered 2011-03-12: 1 [IU] via SUBCUTANEOUS
  Filled 2011-03-10: qty 3

## 2011-03-10 NOTE — Progress Notes (Signed)
Physical Therapy Evaluation Patient Details Name: Herbert Marquez MRN: [id number] DOB: Nov 30, 1927 Today's Date: 03/10/2011 10:55-11:25, EV2  Problem List:  Patient Active Problem List  Diagnoses  . CAD (coronary artery disease)  . Diabetes mellitus  . HTN (hypertension)  . Fracture of femoral neck, left  . ARF (acute renal failure)  . DM (diabetes mellitus)  . Obesity  . COPD (chronic obstructive pulmonary disease)  . Essential hypertension  . Postop Hyponatremia    Past Medical History:  Past Medical History  Diagnosis Date  . Coronary artery disease   . Hyperlipidemia   . Hypertension   . Diabetes mellitus   . BPH (benign prostatic hypertrophy)   . TIA (transient ischemic attack)     Approximately 6 weeks post-cardiac catheterization.    Past Surgical History:  Past Surgical History  Procedure Date  . Coronary angioplasty with stent placement 10/2004    stenting x 2 to RCA  . Hernia repair   . Cataract extraction, bilateral   . Cardiovascular stress test 07/01/2007    EF 74%    PT Assessment/Plan/Recommendation PT Assessment Clinical Impression Statement: 78 y/o WM s/p fall at home fracturing L femoral neck and is now s/p Bipolar hip arthroplasty.  Pt will benefit from acute care to work towards d/c to home with wife and HHPT. PT Recommendation/Assessment: Patient will need skilled PT in the acute care venue PT Problem List: Decreased strength;Decreased range of motion;Decreased mobility;Decreased knowledge of precautions;Decreased knowledge of use of DME PT Therapy Diagnosis : Difficulty walking PT Plan PT Frequency: Min 6X/week PT Treatment/Interventions: DME instruction;Gait training;Stair training;Functional mobility training;Therapeutic activities;Therapeutic exercise;Patient/family education PT Recommendation Follow Up Recommendations: Home health PT Equipment Recommended: Rolling walker with 5" wheels;3 in 1 bedside comode PT Goals  Acute Rehab PT  Goals PT Goal Formulation: With patient/family Time For Goal Achievement: 7 days Pt will go Supine/Side to Sit: with supervision PT Goal: Supine/Side to Sit - Progress: Not met Pt will go Sit to Stand: with modified independence PT Goal: Sit to Stand - Progress: Not met Pt will go Stand to Sit: with modified independence PT Goal: Stand to Sit - Progress: Not met Pt will Ambulate: 51 - 150 feet;with modified independence;with rolling walker PT Goal: Ambulate - Progress: Not met Pt will Go Up / Down Stairs: 1-2 stairs;with min assist;with rail(s);with least restrictive assistive device PT Goal: Up/Down Stairs - Progress: Not met Pt will Perform Home Exercise Program: with supervision, verbal cues required/provided PT Goal: Perform Home Exercise Program - Progress: Not met Additional Goals Additional Goal #1: Pt will be recall 3/3 hip precautions and incorporate into mobility without verbal cues. PT Goal: Additional Goal #1 - Progress: Not met  PT Evaluation Precautions/Restrictions  Precautions Precautions: Posterior Hip Precaution Booklet Issued: Yes (comment) Restrictions Weight Bearing Restrictions: Yes LLE Weight Bearing: Weight bearing as tolerated Prior Functioning  Home Living Lives With: Spouse Receives Help From: Family Type of Home: House Home Layout: One level Home Access: Stairs to enter Entrance Stairs-Rails: Right Entrance Stairs-Number of Steps: 2 Bathroom Shower/Tub: Health visitor: Standard Home Adaptive Equipment: None Prior Function Level of Independence: Independent with gait;Independent with transfers;Independent with homemaking with ambulation Driving: Yes Vocation: Retired Financial risk analyst Arousal/Alertness: Awake/alert Overall Cognitive Status: Appears within functional limits for tasks assessed Orientation Level: Oriented X4 Sensation/Coordination Sensation Light Touch: Appears Intact Coordination Gross Motor Movements are  Fluid and Coordinated: Yes Extremity Assessment RLE Assessment RLE Assessment: Within Functional Limits LLE AROM (degrees) LLE Overall AROM Comments:  AAROM hip flex and hip abd limited by 50% LLE Strength LLE Overall Strength Comments: Good quad set.  Muscle strength grossly 3+/5 Mobility (including Balance) Bed Mobility Supine to Sit: 4: Min assist;With rails;HOB elevated (Comment degrees) Supine to Sit Details (indicate cue type and reason): A with L LE.  Pt able to bring upper body up with use of rail and HOB up. Transfers Sit to Stand: 4: Min assist;From elevated surface Sit to Stand Details (indicate cue type and reason): cues for hand placement Stand to Sit: 4: Min assist Stand to Sit Details: cues for hand placement Ambulation/Gait Ambulation/Gait: Yes Ambulation/Gait Assistance: 3: Mod assist Ambulation/Gait Assistance Details (indicate cue type and reason): Cues for proper sequencing and to push RW further out. Ambulation Distance (Feet): 15 Feet Assistive device: Rolling walker Gait Pattern: Step-to pattern;Antalgic  Posture/Postural Control Posture/Postural Control: No significant limitations Exercise  Total Joint Exercises Ankle Circles/Pumps: Both;10 reps;Supine Quad Sets: AROM;Strengthening;Left;10 reps;Supine Gluteal Sets: Supine;10 reps;Strengthening Heel Slides: AAROM;Left;10 reps;Supine Hip ABduction/ADduction: AAROM;Left;10 reps;Supine End of Session PT - End of Session Equipment Utilized During Treatment: Gait belt Activity Tolerance: Patient tolerated treatment well Patient left: in chair;with call bell in reach;with family/visitor present Nurse Communication: Mobility status for transfers;Mobility status for ambulation General Behavior During Session: [facility] for tasks performed Cognition: [facility] for tasks performed  [facility] LUBECK 03/10/2011, 2:58 PM

## 2011-03-10 NOTE — Progress Notes (Signed)
Subjective: 1 Day Post-Op Procedure(s) (LRB): ARTHROPLASTY BIPOLAR HIP (Left) Patient reports pain as controlled..   Patient sitting up in bed this morning and looks great.  He states that he feels good and not having any complaints or problems. We will start therapy today. Plan is to go home after hospital stay. He states that his wife will be home with him.  Objective: Vital signs in last 24 hours: Temp:  [97.4 F (36.3 C)-98.5 F (36.9 C)] 97.6 F (36.4 C) (01/05 0525) Pulse Rate:  [60-77] 67  (01/05 0525) Resp:  [11-24] 18  (01/05 0525) BP: (93-146)/(47-72) 119/65 mmHg (01/05 0525) SpO2:  [93 %-100 %] 98 % (01/05 0525) FiO2 (%):  [2 %] 2 % (01/05 0157) Weight:  [88.451 kg (195 lb)-91.2 kg (201 lb 1 oz)] 201 lb 1 oz (91.2 kg) (01/04 2224)  Intake/Output from previous day:  Intake/Output Summary (Last 24 hours) at 03/10/11 0841 Last data filed at 03/10/11 0654  Gross per 24 hour  Intake   3385 ml  Output   1260 ml  Net   2125 ml    Intake/Output this shift:    Labs:  Basename 03/10/11 0605 03/09/11 1010  HGB 10.4* 12.4*    Basename 03/10/11 0605 03/09/11 1010  WBC 12.0* 7.8  RBC 3.24* 3.89*  HCT 31.1* 37.1*  PLT 210 278    Basename 03/10/11 0605 03/09/11 1010  NA 130* 136  K 4.5 4.3  CL 100 101  CO2 20 24  BUN 17 24*  CREATININE 1.07 1.41*  GLUCOSE 181* 100*  CALCIUM 8.4 9.6    Basename 03/10/11 0605 03/09/11 1320  LABPT -- --  INR 1.15 1.15    Exam - Neurovascular intact Sensation intact distally Dressing - clean, dry, no drainage Motor function intact - moving foot and toes well on exam.  Hemovac pulled without difficulty.  Past Medical History  Diagnosis Date  . Coronary artery disease   . Hyperlipidemia   . Hypertension   . Diabetes mellitus   . BPH (benign prostatic hypertrophy)   . TIA (transient ischemic attack)     Approximately 6 weeks post-cardiac catheterization.     Assessment/Plan: 1 Day Post-Op Procedure(s)  (LRB): ARTHROPLASTY BIPOLAR HIP (Left)  Up with therapy Discharge home with home health when medically stable and met all his goals.  DVT Prophylaxis - aspirin / Lovenox WBAT Hemovac Pulled Begin Therapy Hip Preacutions  Ebon Ketchum 03/10/2011, 8:41 AM

## 2011-03-10 NOTE — Progress Notes (Signed)
Physical Therapy Treatment Patient Details Name: Herbert Marquez MRN: [id number] DOB: 10-11-1927 Today's Date: 03/10/2011 13:54-14:05, te PT Assessment/Plan  PT - Assessment/Plan Comments on Treatment Session: Nursing aide took BP twice prior to PT session and it was 95/53.  Deferred OOB due to low BP and some feelings of lightheadedness. PT Plan: Discharge plan remains appropriate;Frequency remains appropriate PT Frequency: Min 6X/week Follow Up Recommendations: Home health PT Equipment Recommended: Rolling walker with 5" wheels;3 in 1 bedside comode PT Goals  Acute Rehab PT Goals PT Goal Formulation: With patient/family Time For Goal Achievement: 7 days Pt will Perform Home Exercise Program: with supervision, verbal cues required/provided PT Goal: Perform Home Exercise Program - Progress: Progressing toward goal Additional Goals Additional Goal #1: Pt will be recall 3/3 hip precautions and incorporate into mobility without verbal cues. PT Goal: Additional Goal #1 - Progress: Progressing toward goal (Pt recalled 2/3.)  PT Treatment Precautions/Restrictions  Precautions Precautions: Posterior Hip Precaution Booklet Issued: Yes (comment) Restrictions Weight Bearing Restrictions: Yes LLE Weight Bearing: Weight bearing as tolerated  Exercise  Total Joint Exercises Ankle Circles/Pumps: Both;10 reps;Supine Quad Sets: AROM;Strengthening;Left;10 reps;Supine Gluteal Sets: Supine;10 reps;Strengthening Heel Slides: AAROM;Left;10 reps;Supine Hip ABduction/ADduction: AAROM;Left;10 reps;Supine End of Session PT - End of Session Equipment Utilized During Treatment: Gait belt Activity Tolerance: Patient tolerated treatment well;Treatment limited secondary to medical complications (Comment) (Pt's BP in supine 95/53 with pt c/o some lightheadedness.) Patient left: in bed;with call bell in reach;with family/visitor present Nurse Communication: Mobility status for transfers;Mobility status for  ambulation General Behavior During Session: [facility] for tasks performed Cognition: [facility] for tasks performed  [facility] LUBECK 03/10/2011, 4:24 PM

## 2011-03-10 NOTE — Progress Notes (Signed)
Ortho/cardiology appreciated.   SUBJECTIVE Feels ok, some nausea earlier.   1. Femoral neck fracture   2. CAD (coronary artery disease)     Past Medical History  Diagnosis Date  . Coronary artery disease   . Hyperlipidemia   . Hypertension   . Diabetes mellitus   . BPH (benign prostatic hypertrophy)   . TIA (transient ischemic attack)     Approximately 6 weeks post-cardiac catheterization.    Current Facility-Administered Medications  Medication Dose Route Frequency Provider Last Rate Last Dose  . 0.45 % sodium chloride infusion   Intravenous Continuous Una Yeomans 100 mL/hr at 03/09/11 2121    . acetaminophen (TYLENOL) tablet 650 mg  650 mg Oral Q6H PRN Olanna Percifield       Or  . acetaminophen (TYLENOL) suppository 650 mg  650 mg Rectal Q6H PRN Kaine Mcquillen      . albuterol (PROVENTIL) (5 MG/ML) 0.5% nebulizer solution 2.5 mg  2.5 mg Nebulization Q4H PRN Jauan Wohl      . alum & mag hydroxide-simeth (MAALOX/MYLANTA) 200-200-20 MG/5ML suspension 30 mL  30 mL Oral Q4H PRN Genelle Gather Babish, PA      . aspirin chewable tablet 81 mg  81 mg Oral Daily Shelda Pal   81 mg at 03/10/11 1018  . bisacodyl (DULCOLAX) EC tablet 5 mg  5 mg Oral Daily PRN Genelle Gather Babish, PA      . ceFAZolin (ANCEF) IVPB 2 g/50 mL premix  2 g Intravenous Q6H Genelle Gather [place], [place]   2 g at 03/10/11 1202  . diphenhydrAMINE (BENADRYL) capsule 25 mg  25 mg Oral Q6H PRN Genelle Gather Bab[place], PA      . docusate sodium (COLACE) capsule 100 mg  100 mg Oral BID Genelle Gather [place], [place]   100 mg at 03/10/11 1019  . doxazosin (CARDURA) tablet 8 mg  8 mg Oral Daily Rillie Riffel   8 mg at 03/10/11 1019  . enoxaparin (LOVENOX) injection 40 mg  40 mg Subcutaneous Q24H Genelle Gather [place], [place]   40 mg at 03/10/11 0800  . fentaNYL (SUBLIMAZE) 0.05 MG/ML injection           . ferrous sulfate tablet 325 mg  325 mg Oral TID PC Genelle Gather [place], [place]   325 mg at 03/10/11 1301  . HYDROcodone-acetaminophen  (NORCO) 5-325 MG per tablet 1-2 tablet  1-2 tablet Oral Q4H PRN Damariz Paganelli      . HYDROcodone-acetaminophen (NORCO) 7.5-325 MG per tablet 1-2 tablet  1-2 tablet Oral Q4H Genelle Gather [place], [place]   1 tablet at 03/10/11 1302  . HYDROmorphone (DILAUDID) 1 MG/ML injection           . HYDROmorphone (DILAUDID) injection 0.5-2 mg  0.5-2 mg Intravenous Q2H PRN Genelle Gather Bab[place], PA      . insulin aspart (novoLOG) injection 0-9 Units  0-9 Units Subcutaneous Q4H Larren Copes   2 Units at 03/10/11 1202  . lactated ringers infusion   Intravenous Continuous Melinda Hatchel Gilbert      . menthol-cetylpyridinium (CEPACOL) lozenge 3 mg  1 lozenge Oral PRN Genelle Gather Babish, PA       Or  . phenol (CHLORASEPTIC) mouth spray 1 spray  1 spray Mouth/Throat PRN Genelle Gather Bab[place], PA      . methocarbamol (ROBAXIN) tablet 500 mg  500 mg Oral Q6H PRN Genelle Gather [place], PA       Or  . methocarbamol (ROBAXIN) 500 mg in dextrose 5 %  50 mL IVPB  500 mg Intravenous Q6H PRN Genelle Gather Bab[place], PA      . metoCLOPramide (REGLAN) tablet 5-10 mg  5-10 mg Oral Q8H PRN Genelle Gather [place], PA       Or  . metoCLOPramide (REGLAN) injection 5-10 mg  5-10 mg Intravenous Q8H PRN Genelle Gather Bab[place], PA      . metoprolol (TOPROL-XL) 24 hr tablet 50 mg  50 mg Oral Daily Carlitos Bottino   50 mg at 03/10/11 1019  . morphine 2 MG/ML injection 4 mg  4 mg Intravenous Q4H PRN Samanvi Cuccia   4 mg at 03/09/11 1402  . mulitivitamin with minerals tablet 1 tablet  1 tablet Oral Daily Shelda Pal   1 tablet at 03/10/11 1019  . nitroGLYCERIN (NITROSTAT) SL tablet 0.4 mg  0.4 mg Sublingual Q5 min PRN Hobart Marte      . omeprazole (PRILOSEC) capsule 20 mg  20 mg Oral Q1200 Alexzandrew Perkins, PA   20 mg at 03/10/11 1201  . ondansetron (ZOFRAN) tablet 4 mg  4 mg Oral Q6H PRN Maddisen Vought       Or  . ondansetron (ZOFRAN) injection 4 mg  4 mg Intravenous Q6H PRN Toma Erichsen   4 mg at 03/09/11 1401  . pioglitazone (ACTOS)  tablet 30 mg  30 mg Oral Daily Laporshia Hogen   30 mg at 03/10/11 1019  . polyethylene glycol (MIRALAX / GLYCOLAX) packet 17 g  17 g Oral BID Genelle Gather [place], PA   17 g at 03/10/11 1020  . senna (SENOKOT) tablet 8.6 mg  1 tablet Oral BID Jaleah Lefevre   8.6 mg at 03/10/11 1019  . simvastatin (ZOCOR) tablet 40 mg  40 mg Oral q1800 Jacoria Keiffer   40 mg at 03/09/11 2220  . sodium chloride 0.9 % 1,000 mL with potassium chloride 10 mEq infusion  100 mL/hr Intravenous Continuous Genelle Gather Babish, PA 100 mL/hr at 03/10/11 1028 100 mL/hr at 03/10/11 1028  . sodium chloride 0.9 % bolus 250 mL  250 mL Intravenous Once Zayna Toste      . sodium phosphate (FLEET) 7-19 GM/118ML enema 1 enema  1 enema Rectal Once PRN Genelle Gather Bab[place], PA      . zolpidem (AMBIEN) tablet 5 mg  5 mg Oral QHS PRN Aliea Bobe      . DISCONTD: 0.9 % irrigation (POUR BTL)    PRN Madlyn Frankel [place]   1,000 mL at 03/09/11 1801  . DISCONTD: acetaminophen (TYLENOL) suppository 650 mg  650 mg Rectal Q6H PRN Genelle Gather [place], PA      . DISCONTD: acetaminophen (TYLENOL) tablet 650 mg  650 mg Oral Q6H PRN Genelle Gather [place], PA      . DISCONTD: aspirin tablet 81 mg  81 mg Oral Daily Genia Perin      . DISCONTD: fentaNYL (SUBLIMAZE) injection 100 mcg  100 mcg Intravenous Once Glynn Octave, MD      . DISCONTD: HYDROmorphone (DILAUDID) injection 0.25-0.5 mg  0.25-0.5 mg Intravenous Q5 min PRN Jill Side, MD   0.5 mg at 03/09/11 2030  . DISCONTD: multivitamin tablet 1 tablet  1 tablet Oral Daily Terald Jump      . DISCONTD: NON FORMULARY 20 mg  20 mg Oral Daily Alexzandrew Perkins, PA      . DISCONTD: ondansetron (ZOFRAN) injection 4 mg  4 mg Intravenous Q6H PRN Genelle Gather Babish, PA      . DISCONTD: ondansetron [facility]) tablet 4  mg  4 mg Oral Q6H PRN Genelle Gather Babish, PA      . DISCONTD: pantoprazole (PROTONIX) EC tablet 40 mg  40 mg Oral Q1200 Magaly Pollina   40 mg at 03/09/11 2257  . DISCONTD:  promethazine (PHENERGAN) injection 6.25-12.5 mg  6.25-12.5 mg Intravenous Q15 min PRN Jill Side, MD      . DISCONTD: ramipril (ALTACE) capsule 2.5 mg  2.5 mg Oral Daily Briellah Baik   2.5 mg at 03/10/11 1019  . DISCONTD: zolpidem (AMBIEN) tablet 5 mg  5 mg Oral QHS PRN Gerrit Halls, PA       Facility-Administered Medications Ordered in Other Encounters  Medication Dose Route Frequency Provider Last Rate Last Dose  . DISCONTD: acetaminophen (OFIRMEV) IV    PRN Melinda Hatchel Gilbert   1,000 mg at 03/09/11 1823  . DISCONTD: ceFAZolin (ANCEF) IVPB 1 g/50 mL premix    PRN Melinda Hatchel Gilbert   2 g at 03/09/11 1807  . DISCONTD: dexamethasone (DECADRON) injection    PRN Melinda Hatchel Gilbert   10 mg at 03/09/11 1818  . DISCONTD: ePHEDrine injection    PRN Melinda Hatchel Gilbert   10 mg at 03/09/11 1857  . DISCONTD: fentaNYL (SUBLIMAZE) injection    PRN Melinda Hatchel Gilbert   50 mcg at 03/09/11 1830  . DISCONTD: lactated ringers infusion    Continuous PRN Melinda Hatchel Gilbert      . DISCONTD: lidocaine (cardiac) 100 mg/51ml (XYLOCAINE) 20 MG/ML injection 2%    PRN Melinda Hatchel Gilbert   100 mg at 03/09/11 1803  . DISCONTD: ondansetron (ZOFRAN) injection    PRN Elisabeth Cara, CRNA   4 mg at 03/09/11 1852  . DISCONTD: propofol (DIPRIVAN) 10 MG/ML infusion    PRN Melinda Hatchel Gilbert   50 mg at 03/09/11 1810  . DISCONTD: succinylcholine (ANECTINE) injection    PRN Melinda Hatchel Gilbert   100 mg at 03/09/11 1803   No Known Allergies Principal Problem:  *Fracture of femoral neck, left Active Problems:  ARF (acute renal failure)  DM (diabetes mellitus)  Obesity  COPD (chronic obstructive pulmonary disease)  Essential hypertension  Postop Hyponatremia   Vital signs in last 24 hours: Temp:  [97.4 F (36.3 C)-98.3 F (36.8 C)] 97.4 F (36.3 C) (01/05 1350) Pulse Rate:  [55-77] 55  (01/05 1350) Resp:  [13-24] 18  (01/05 1350) BP: (93-146)/(47-72) 95/53  mmHg (01/05 1350) SpO2:  [94 %-100 %] 98 % (01/05 1350) FiO2 (%):  [2 %] 2 % (01/05 0157) Weight:  [88.451 kg (195 lb)-91.2 kg (201 lb 1 oz)] 201 lb 1 oz (91.2 kg) (01/04 2224) Weight change:     Intake/Output from previous day: 01/04 0701 - 01/05 0700 In: 3385 [P.O.:360; I.V.:2825; IV Piggyback:100] Out: 1260 [Urine:850; Drains:110; Blood:300] Intake/Output this shift: Total I/O In: 120 [P.O.:120] Out: 100 [Urine:100]  Lab Results:  [facility] 03/10/11 0605 03/09/11 1010  WBC 12.0* 7.8  HGB 10.4* 12.4*  HCT 31.1* 37.1*  PLT 210 278   BMET  Basename 03/10/11 0605 03/09/11 1010  NA 130* 136  K 4.5 4.3  CL 100 101  CO2 20 24  GLUCOSE 181* 100*  BUN 17 24*  CREATININE 1.07 1.41*  CALCIUM 8.4 9.6    Studies/Results: Dg Chest 2 View  03/09/2011  *RADIOLOGY REPORT*  Clinical Data: Recent fall, left hip pain  CHEST - 2 VIEW  Comparison: Chest x-ray of 05/18/2008  Findings: The lungs remain hyperaerated consistent with COPD with chronic  interstitial change present.  No active infiltrate or effusion is seen.  Cardiomegaly is stable.  The bones are osteopenic.  No compression deformity is noted.  IMPRESSION: COPD and chronic interstitial change.  Stable cardiomegaly.  Original Report Authenticated By: Juline Patch, M.D.   Dg Hip Complete Left  03/09/2011  *RADIOLOGY REPORT*  Clinical Data: Fall and left hip pain.  LEFT HIP - COMPLETE 2+ VIEW  Comparison: None.  Findings: AP view of the pelvis and two views of the left hip were obtained.  There is a fracture of the left femoral neck.  This fracture is displaced and appears to extend into the subcapital region along the lateral aspect of the fracture.  The left femoral head is located.  The patient has prominent vascular calcifications.  No gross abnormality to the right hip on the pelvic view. The pubic rami appear to be intact.  IMPRESSION: Displaced left femoral neck fracture.  Subcapital component along the lateral aspect of fracture.   Original Report Authenticated By: Richarda Overlie, M.D.    Medications: I have reviewed the patient's current medications.   Physical exam GENERAL- alert HEAD- normal atraumatic, no neck masses, normal thyroid, no jvd RESPIRATORY- appears well, vitals normal, no respiratory distress, acyanotic, normal RR, ear and throat exam is normal, neck free of mass or lymphadenopathy, chest clear, no wheezing, crepitations, rhonchi, normal symmetric air entry CVS- regular rate and rhythm, S1, S2 normal, no murmur, click, rub or gallop ABDOMEN- abdomen is soft without significant tenderness, masses, organomegaly or guarding NEURO- Grossly normal EXTREMITIES- extremities normal, atraumatic, no cyanosis or edema, no bleed from left hip.  Plan .Fracture of femoral neck, left- s/p orif. Doing well. Defer mx to ortho. .ARF (acute renal failure)- bun/cr improved with fluids off diuretics/acei. Monitor off same since bP remains low. NS bolus. .DM (diabetes mellitus)- ssi/pioglitazone. .Obesity- life style changes. Marland KitchenCOPD (chronic obstructive pulmonary disease)- stable. Albuterol inhalations prn. .Essential hypertension/cad- BP low. Hold acei for now. Challenge with fluids. Marland Kitchenleukocytosis- seems reactionary, monitor. Disposition- wants to eventually go home.   Herbert Marquez 03/10/2011 4:33 PM Pager: [phone].

## 2011-03-10 NOTE — Op Note (Signed)
NAMEMarland Kitchen  KENDRY, PFARR NO.:  [id number]  MEDICAL RECORD NO.:  [id number]  LOCATION:  1415                         FACILITY:  [facility]  PHYSICIAN:  Madlyn Frankel. Charlann Boxer, M.D.  DATE OF BIRTH:  June 10, 1927  DATE OF PROCEDURE:  03/09/2011 DATE OF DISCHARGE:                              OPERATIVE REPORT   PREOPERATIVE DIAGNOSIS:  Left femoral neck fracture with varus angulation.  POSTOPERATIVE DIAGNOSIS:  Left femoral neck fracture with varus angulation.  PROCEDURE:  Left hip hemiarthroplasty utilizing DePuy components with a size 9 standard Tri-Lock stem, a 52 +0 unipolar ball and adapter.  SURGEON:  Madlyn Frankel. Charlann Boxer, M.D.  ASSISTANT:  Lanney Gins, P.A.C.  Lanney Gins was present for the entire case from preoperative position, perioperative lower extremity management, retractor management, and general facilitation of the case as well as primary wound closure.  ANESTHESIA:  General.  SPECIMENS:  None.  DRAINS:  One medium Hemovac.  BLOOD LOSS:  300-400 cc.  COMPLICATION:  None apparent.  INDICATIONS FOR PROCEDURE:  Mr. Mcgaugh is an 78 year old male who resides with his wife in their home.  He was rounding the corner today when his knee gave way and he fell directly onto his left hip.  He had immediate onset of pain, was brought to the emergency room, and radiographs revealed a femoral neck fracture consistent with his clinical presentation.  He was admitted by the medical service.  Cardiology has seen him based on his cardiac history with recent placed cardiac stent. Recommendations were to proceed with surgery, but to be mindful of his stent and he remained on aspirin.  We reviewed the indications of surgical procedure, the risks of infection, DVT, his comorbid conditions, as well as the postoperative course and expectations.  Consent was obtained for management of fracture.  PROCEDURE IN DETAIL:  The patient was brought to the operative theater. Once  adequate anesthesia, preoperative antibiotics, Ancef 2 g were administered, he was positioned into the right lateral decubitus position with the left side up.  The left lower extremity was then prepped and draped in sterile fashion.  A time-out was performed identifying the patient, planned procedure, and extremity.  A lateral incision was made based off the proximal trochanter.  Sharp dissection to the iliotibial band and gluteal fascia was carried out. This was then incised for a posterior approach to the hip.  The short external rotators were identified and taken down, separated from the posterior capsule, and an L-capsulotomy was made preserving the posterior capsule for later anatomic repair.  The fracture site was identified.  The hip was flexed and internally rotated.  Based on the mid cervical nature of the fracture pattern, I did use an oscillating saw and we created a fresh osteotomy into the trochanteric fossa.  The femoral head was then removed and measured on the back table, measured to be 52 mm.  At this point, with the retractors placed, attention was now directed to the proximal femur.  The proximal femur was then opened with drill, hand- reamed once, and then irrigated to try to prevent fat emboli.  I then began broaching with a 0 broach and broached up to a size  9, with good medial and lateral metaphyseal fit.  The trial reduction was now carried out with a standard neck based off his preoperative radiographs of the contralateral hip.  I felt the leg lengths were equal.  There was no evidence of any subluxation or impingement.  For this reason, we chose the final implant.  The trial components removed.  The hip was re-irrigated with normal saline solution.  A size 9 Tri-Lock standard insert stem was then inserted at the level of the neck cut.  Based on this and the trial reduction, a 0 adapter was chosen and impacted into the 52 unipolar ball, and the two were then  impacted onto a clean and dry trunnion.  The hip was reduced.  At this point, we re-irrigated the hip, reapproximated the posterior capsule superior leaflet using #1 Vicryl, and placed a medium Hemovac drain deep.  The iliotibial band and gluteal fascia were reapproximated over top of the drain.  The remainder of the wound was closed with 2-0 Vicryl and running 4-0 Monocryl.  The hip was cleaned, dried, and dressed sterilely using Dermabond and Aquacel dressing. Drain site dressed separately.  He was then brought to recovery room in stable condition, tolerating the procedure well.     Madlyn Frankel Charlann Boxer, M.D.     MDO/MEDQ  D:  03/10/2011  T:  03/10/2011  Job:  [id number]

## 2011-03-10 NOTE — Progress Notes (Signed)
Herbert Bottoms, MD, [facility] ABIM Board Certified in Adult Cardiovascular Medicine,Internal Medicine and Critical Care Medicine    SUBJECTIVE  Patient reports no chest pain. He has had his surgery yesterday. He reports no orthopnea PND. There are no heart failure symptoms. Is feeling quite well. I checked his telemetry and is in normal sinus rhythm. No active cardiac issues.  Principal Problem:  *Fracture of femoral neck, left Active Problems:  ARF (acute renal failure)  DM (diabetes mellitus)  Obesity  COPD (chronic obstructive pulmonary disease)  Essential hypertension   Past Medical History  Diagnosis Date  . Coronary artery disease   . Hyperlipidemia   . Hypertension   . Diabetes mellitus   . BPH (benign prostatic hypertrophy)   . TIA (transient ischemic attack)     Approximately 6 weeks post-cardiac catheterization.     Current Facility-Administered Medications  Medication Dose Route Frequency Provider Last Rate Last Dose  . 0.45 % sodium chloride infusion   Intravenous Continuous Simbiso Ranga 100 mL/hr at 03/09/11 2121    . acetaminophen (TYLENOL) tablet 650 mg  650 mg Oral Q6H PRN Simbiso Ranga       Or  . acetaminophen (TYLENOL) suppository 650 mg  650 mg Rectal Q6H PRN Simbiso Ranga      . albuterol (PROVENTIL) (5 MG/ML) 0.5% nebulizer solution 2.5 mg  2.5 mg Nebulization Q4H PRN Simbiso Ranga      . alum & mag hydroxide-simeth (MAALOX/MYLANTA) 200-200-20 MG/5ML suspension 30 mL  30 mL Oral Q4H PRN Genelle Gather Bab[place], PA      . aspirin chewable tablet 81 mg  81 mg Oral Daily Shelda Pal   81 mg at 03/09/11 2220  . bisacodyl (DULCOLAX) EC tablet 5 mg  5 mg Oral Daily PRN Genelle Gather Babish, PA      . ceFAZolin (ANCEF) IVPB 2 g/50 mL premix  2 g Intravenous Q6H Genelle Gather [place], [place]   2 g at 03/10/11 0524  . diphenhydrAMINE (BENADRYL) capsule 25 mg  25 mg Oral Q6H PRN Genelle Gather Babish, PA      . docusate sodium (COLACE) capsule 100 mg  100 mg Oral BID  Genelle Gather [place], PA   100 mg at 03/09/11 2219  . doxazosin (CARDURA) tablet 8 mg  8 mg Oral Daily Simbiso Ranga   8 mg at 03/09/11 2219  . enoxaparin (LOVENOX) injection 40 mg  40 mg Subcutaneous Q24H Genelle Gather [place], [place]      . fentaNYL (SUBLIMAZE) 0.05 MG/ML injection           . fentaNYL (SUBLIMAZE) 0.05 MG/ML injection        100 mcg at 03/09/11 1539  . fentaNYL (SUBLIMAZE) injection 100 mcg  100 mcg Intravenous Once Glynn Octave, MD   100 mcg at 03/09/11 1004  . ferrous sulfate tablet 325 mg  325 mg Oral TID PC Genelle Gather [place], [place]      . HYDROcodone-acetaminophen (NORCO) 5-325 MG per tablet 1-2 tablet  1-2 tablet Oral Q4H PRN Simbiso Ranga      . HYDROcodone-acetaminophen (NORCO) 7.5-325 MG per tablet 1-2 tablet  1-2 tablet Oral Q4H Genelle Gather [place], [place]   2 tablet at 03/10/11 0326  . HYDROmorphone (DILAUDID) 1 MG/ML injection           . HYDROmorphone (DILAUDID) injection 0.5-2 mg  0.5-2 mg Intravenous Q2H PRN Genelle Gather Babish, PA      . insulin aspart (novoLOG) injection 0-9 Units  0-9 Units  Subcutaneous Q4H Simbiso Ranga   2 Units at 03/10/11 0813  . lactated ringers infusion   Intravenous Continuous Melinda Hatchel [place]      . menthol-cetylpyridinium (CEPACOL) lozenge 3 mg  1 lozenge Oral PRN Genelle Gather Babish, PA       Or  . phenol (CHLORASEPTIC) mouth spray 1 spray  1 spray Mouth/Throat PRN Genelle Gather Babish, PA      . methocarbamol (ROBAXIN) tablet 500 mg  500 mg Oral Q6H PRN Genelle Gather Babish, PA       Or  . methocarbamol (ROBAXIN) 500 mg in dextrose 5 % 50 mL IVPB  500 mg Intravenous Q6H PRN Genelle Gather Babish, PA      . metoCLOPramide (REGLAN) tablet 5-10 mg  5-10 mg Oral Q8H PRN Genelle Gather Babish, PA       Or  . metoCLOPramide (REGLAN) injection 5-10 mg  5-10 mg Intravenous Q8H PRN Genelle Gather Babish, PA      . metoprolol (TOPROL-XL) 24 hr tablet 50 mg  50 mg Oral Daily Simbiso Ranga   50 mg at 03/09/11 2220  . morphine 2 MG/ML  injection 4 mg  4 mg Intravenous Q4H PRN Simbiso Ranga   4 mg at 03/09/11 1402  . mulitivitamin with minerals tablet 1 tablet  1 tablet Oral Daily Shelda Pal   1 tablet at 03/09/11 2220  . nitroGLYCERIN (NITROSTAT) SL tablet 0.4 mg  0.4 mg Sublingual Q5 min PRN Simbiso Ranga      . ondansetron (ZOFRAN) tablet 4 mg  4 mg Oral Q6H PRN Simbiso Ranga       Or  . ondansetron (ZOFRAN) injection 4 mg  4 mg Intravenous Q6H PRN Simbiso Ranga   4 mg at 03/09/11 1401  . pantoprazole (PROTONIX) EC tablet 40 mg  40 mg Oral Q1200 Simbiso Ranga   40 mg at 03/09/11 2257  . pioglitazone (ACTOS) tablet 30 mg  30 mg Oral Daily Simbiso Ranga   30 mg at 03/09/11 2220  . polyethylene glycol (MIRALAX / GLYCOLAX) packet 17 g  17 g Oral BID Genelle Gather [place], PA   17 g at 03/09/11 2257  . ramipril (ALTACE) capsule 2.5 mg  2.5 mg Oral Daily Simbiso Ranga   2.5 mg at 03/09/11 2220  . senna (SENOKOT) tablet 8.6 mg  1 tablet Oral BID Simbiso Ranga   8.6 mg at 03/09/11 2225  . simvastatin (ZOCOR) tablet 40 mg  40 mg Oral q1800 Simbiso Ranga   40 mg at 03/09/11 2220  . sodium chloride 0.9 % 1,000 mL with potassium chloride 10 mEq infusion  100 mL/hr Intravenous Continuous Genelle Gather [place], PA 100 mL/hr at 03/09/11 2218 100 mL/hr at 03/09/11 2218  . sodium phosphate (FLEET) 7-19 GM/118ML enema 1 enema  1 enema Rectal Once PRN Genelle Gather Babish, PA      . zolpidem (AMBIEN) tablet 5 mg  5 mg Oral QHS PRN Simbiso Ranga      . DISCONTD: 0.9 % irrigation (POUR BTL)    PRN Madlyn Frankel Olin   1,000 mL at 03/09/11 1801  . DISCONTD: acetaminophen (TYLENOL) suppository 650 mg  650 mg Rectal Q6H PRN Genelle Gather Babish, PA      . DISCONTD: acetaminophen (TYLENOL) tablet 650 mg  650 mg Oral Q6H PRN Genelle Gather Babish, PA      . DISCONTD: albuterol (PROVENTIL HFA;VENTOLIN HFA) 108 (90 BASE) MCG/ACT inhaler 2 puff  2 puff Inhalation Q4H PRN Simbiso Ranga      .  DISCONTD: aspirin tablet 81 mg  81 mg Oral Daily Simbiso Ranga        . DISCONTD: fentaNYL (SUBLIMAZE) injection 100 mcg  100 mcg Intravenous Once Glynn Octave, MD      . DISCONTD: fentaNYL (SUBLIMAZE) injection 100 mcg  100 mcg Intravenous Once Glynn Octave, MD      . DISCONTD: HYDROmorphone (DILAUDID) injection 0.25-0.5 mg  0.25-0.5 mg Intravenous Q5 min PRN Jill Side, MD   0.5 mg at 03/09/11 2030  . DISCONTD: multivitamin tablet 1 tablet  1 tablet Oral Daily Simbiso Ranga      . DISCONTD: ondansetron (ZOFRAN) injection 4 mg  4 mg Intravenous Q6H PRN Genelle Gather [place], PA      . DISCONTD: ondansetron [facility]) tablet 4 mg  4 mg Oral Q6H PRN Genelle Gather [place], PA      . DISCONTD: promethazine (PHENERGAN) injection 6.25-12.5 mg  6.25-12.5 mg Intravenous Q15 min PRN Jill Side, MD      . DISCONTD: zolpidem (AMBIEN) tablet 5 mg  5 mg Oral QHS PRN Gerrit Halls, PA       Facility-Administered Medications Ordered in Other Encounters  Medication Dose Route Frequency Provider Last Rate Last Dose  . DISCONTD: acetaminophen (OFIRMEV) IV    PRN Melinda Hatchel Gilbert   1,000 mg at 03/09/11 1823  . DISCONTD: ceFAZolin (ANCEF) IVPB 1 g/50 mL premix    PRN Melinda Hatchel Gilbert   2 g at 03/09/11 1807  . DISCONTD: dexamethasone (DECADRON) injection    PRN Melinda Hatchel Gilbert   10 mg at 03/09/11 1818  . DISCONTD: ePHEDrine injection    PRN Melinda Hatchel Gilbert   10 mg at 03/09/11 1857  . DISCONTD: fentaNYL (SUBLIMAZE) injection    PRN Melinda Hatchel Gilbert   50 mcg at 03/09/11 1830  . DISCONTD: lactated ringers infusion    Continuous PRN Melinda Hatchel Gilbert      . DISCONTD: lidocaine (cardiac) 100 mg/17ml (XYLOCAINE) 20 MG/ML injection 2%    PRN Melinda Hatchel Gilbert   100 mg at 03/09/11 1803  . DISCONTD: ondansetron (ZOFRAN) injection    PRN Elisabeth Cara, CRNA   4 mg at 03/09/11 1852  . DISCONTD: propofol (DIPRIVAN) 10 MG/ML infusion    PRN Melinda Hatchel Gilbert   50 mg at 03/09/11 1810  . DISCONTD: succinylcholine  (ANECTINE) injection    PRN Melinda Hatchel Gilbert   100 mg at 03/09/11 1803    LABS: Basic Metabolic Panel:  Basename 03/10/11 0605 03/09/11 1010  NA 130* 136  K 4.5 4.3  CL 100 101  CO2 20 24  GLUCOSE 181* 100*  BUN 17 24*  CREATININE 1.07 1.41*  CALCIUM 8.4 9.6  MG -- --  PHOS -- --   Liver Function Tests:  [facility] 03/10/11 0605  AST 32  ALT 13  ALKPHOS 60  BILITOT 0.3  PROT 5.9*  ALBUMIN 2.9*   No results found for this basename: LIPASE:2,AMYLASE:2 in the last 72 hours CBC:  Basename 03/10/11 0605 03/09/11 1010  WBC 12.0* 7.8  NEUTROABS -- 5.9  HGB 10.4* 12.4*  HCT 31.1* 37.1*  MCV 96.0 95.4  PLT 210 278   Cardiac Enzymes: No results found for this basename: CKTOTAL:3,CKMB:3,CKMBINDEX:3,TROPONINI:3 in the last 72 hours BNP: No components found with this basename: POCBNP:3 D-Dimer: No results found for this basename: DDIMER:2 in the last 72 hours Hemoglobin A1C:  Basename 03/09/11 1010  HGBA1C 5.9*   Fasting Lipid Panel: No results found for  this basename: CHOL,HDL,LDLCALC,TRIG,CHOLHDL,LDLDIRECT in the last 72 hours Thyroid Function Tests:  Basename 03/09/11 1320  TSH 1.789  T4TOTAL --  T3FREE --  THYROIDAB --    RADIOLOGY: Dg Chest 2 View  03/09/2011  *RADIOLOGY REPORT*  Clinical Data: Recent fall, left hip pain  CHEST - 2 VIEW  Comparison: Chest x-ray of 05/18/2008  Findings: The lungs remain hyperaerated consistent with COPD with chronic interstitial change present.  No active infiltrate or effusion is seen.  Cardiomegaly is stable.  The bones are osteopenic.  No compression deformity is noted.  IMPRESSION: COPD and chronic interstitial change.  Stable cardiomegaly.  Original Report Authenticated By: Juline Patch, M.D.   Dg Hip Complete Left  03/09/2011  *RADIOLOGY REPORT*  Clinical Data: Fall and left hip pain.  LEFT HIP - COMPLETE 2+ VIEW  Comparison: None.  Findings: AP view of the pelvis and two views of the left hip were obtained.  There  is a fracture of the left femoral neck.  This fracture is displaced and appears to extend into the subcapital region along the lateral aspect of the fracture.  The left femoral head is located.  The patient has prominent vascular calcifications.  No gross abnormality to the right hip on the pelvic view. The pubic rami appear to be intact.  IMPRESSION: Displaced left femoral neck fracture.  Subcapital component along the lateral aspect of fracture.  Original Report Authenticated By: Richarda Overlie, M.D.    PHYSICAL EXAM  Filed Vitals:   03/09/11 2103 03/09/11 2224 03/10/11 0157 03/10/11 0525  BP: 146/71  93/47 119/65  Pulse: 67  71 67  Temp: 97.5 F (36.4 C)  97.4 F (36.3 C) 97.6 F (36.4 C)  TempSrc:   Oral Oral  Resp: 18  20 18   Height:  5\' 7"  (1.702 m)    Weight:  201 lb 1 oz (91.2 kg)    SpO2: 97%  96% 98%   BP 119/65  Pulse 67  Temp(Src) 97.6 F (36.4 C) (Oral)  Resp 18  Ht 5\' 7"  (1.702 m)  Wt 201 lb 1 oz (91.2 kg)  BMI 31.49 kg/m2  SpO2 98%  Intake/Output Summary (Last 24 hours) at 03/10/11 0816 Last data filed at 03/10/11 0654  Gross per 24 hour  Intake   3385 ml  Output   1260 ml  Net   2125 ml   I/O last 3 completed shifts: In: 3385 [P.O.:360; I.V.:2825; Other:100; IV Piggyback:100] Out: 1260 [Urine:850; Drains:110; Blood:300] General: well- nourished. No distress HEENT: normal carotid upstroke. Normal JVP. No thyromegaly Cardiac: RRR, NL S1/S2. No pathologic murmurs Lungs: clear BS bilaterally, no wheezing. Abdomen, soft, non- tender Extremities. No edema. Normal distal pulses Skin: warm and dry Psychologic: normal affect.   TELEMETRY: Reviewed telemetry pt in normal sinus rhythm by handheld single-lead telemetry  ASSESSMENT AND PLAN: 1. Femoral neck fracture   2. CAD (coronary artery disease)     Patient Active Problem List  Diagnoses  . CAD (coronary artery disease)-status post stent to the RCA x2-3 years ago, prior ejection fraction 50%   . Diabetes  mellitus  . HTN (hypertension)  . Fracture of femoral neck, left-status post arthroplasty bipolar hip   . ARF (acute renal failure)  . DM (diabetes mellitus)  . Obesity  . COPD (chronic obstructive pulmonary disease)  . Essential hypertension     PLAN   I do not see results of the echocardiogram that. I suspect this is pending. Echocardiogram was performed last evening. I  also asked the nurse to look for to report that she cannot find it either. It may well be that the echocardiogram has not been read year.  From a cardiac standpoint the patient is doing quite well. He reports no chest pain and there are no heart failure symptoms.  The patient is resumed on his aspirin and is on DVT prophylaxis with Lovenox.  His vital signs are stable and we have no further recommendation regarding cardiac medical therapy.  Blood sugar has been elevated and he should likely be covered. We will leave this to the primary team.   Herbert Marquez 03/10/2011, 8:16 AM

## 2011-03-10 NOTE — Brief Op Note (Signed)
03/09/2011  8:37 AM  PATIENT:  Herbert Marquez  78 y.o. male  PRE-OPERATIVE DIAGNOSIS:  fractured left hip  POST-OPERATIVE DIAGNOSIS:  fractured left hip  PROCEDURE:  Procedure(s):LEFT HIP HEMIARTHROPLASTY ARTHROPLASTY BIPOLAR HIP  SURGEON:  Surgeon(s): Shelda Pal  PHYSICIAN ASSISTANT: Lanney Gins, PA-C  ANESTHESIA:   general  EBL:   300cc  BLOOD ADMINISTERED:none  DRAINS: (1 medium hemovac) Hemovact drain(s) in the left hip with  Suction Open   LOCAL MEDICATIONS USED:  NONE  SPECIMEN:  No Specimen  DISPOSITION OF SPECIMEN:  N/A  COUNTS:  YES  TOURNIQUET:  * No tourniquets in log *  DICTATION: .Other Dictation: Dictation Number [id number]  PLAN OF CARE: Admit to inpatient   PATIENT DISPOSITION:  PACU - hemodynamically stable.   Delay start of Pharmacological VTE agent (>24hrs) due to surgical blood loss or risk of bleeding:  {YES/NO/NOT APPLICABLE:20182

## 2011-03-11 LAB — BASIC METABOLIC PANEL
BUN: 21 mg/dL (ref 6–23)
CO2: 24 mEq/L (ref 19–32)
Calcium: 8.4 mg/dL (ref 8.4–10.5)
Chloride: 104 mEq/L (ref 96–112)
Creatinine, Ser: 1.27 mg/dL (ref 0.50–1.35)
GFR calc Af Amer: 59 mL/min — ABNORMAL LOW (ref >90)
GFR calc non Af Amer: 51 mL/min — ABNORMAL LOW (ref >90)
Glucose, Bld: 119 mg/dL — ABNORMAL HIGH (ref 70–99)
Potassium: 4.5 mEq/L (ref 3.5–5.1)
Sodium: 135 mEq/L (ref 135–145)

## 2011-03-11 LAB — CBC
HCT: 26.9 % — ABNORMAL LOW (ref 39.0–52.0)
Hemoglobin: 9 g/dL — ABNORMAL LOW (ref 13.0–17.0)
MCH: 32.3 pg (ref 26.0–34.0)
MCHC: 33.5 g/dL (ref 30.0–36.0)
MCV: 96.4 fL (ref 78.0–100.0)
Platelets: 187 10*3/uL (ref 150–400)
RBC: 2.79 MIL/uL — ABNORMAL LOW (ref 4.22–5.81)
RDW: 14.8 % (ref 11.5–15.5)
WBC: 11.9 10*3/uL — ABNORMAL HIGH (ref 4.0–10.5)

## 2011-03-11 LAB — GLUCOSE, CAPILLARY
Glucose-Capillary: 109 mg/dL — ABNORMAL HIGH (ref 70–99)
Glucose-Capillary: 109 mg/dL — ABNORMAL HIGH (ref 70–99)
Glucose-Capillary: 112 mg/dL — ABNORMAL HIGH (ref 70–99)
Glucose-Capillary: 119 mg/dL — ABNORMAL HIGH (ref 70–99)
Glucose-Capillary: 144 mg/dL — ABNORMAL HIGH (ref 70–99)

## 2011-03-11 LAB — URINE CULTURE
Colony Count: NO GROWTH
Culture  Setup Time: 201301050112
Culture: NO GROWTH

## 2011-03-11 NOTE — Progress Notes (Signed)
Occupational Therapy Evaluation Patient Details Name: Herbert Marquez MRN: [id number] DOB: 08-17-1927 Today's Date: 03/11/2011  Problem List:  Patient Active Problem List  Diagnoses  . CAD (coronary artery disease)  . Diabetes mellitus  . HTN (hypertension)  . Fracture of femoral neck, left  . ARF (acute renal failure)  . DM (diabetes mellitus)  . Obesity  . COPD (chronic obstructive pulmonary disease)  . Essential hypertension  . Postop Hyponatremia    Past Medical History:  Past Medical History  Diagnosis Date  . Coronary artery disease   . Hyperlipidemia   . Hypertension   . Diabetes mellitus   . BPH (benign prostatic hypertrophy)   . TIA (transient ischemic attack)     Approximately 6 weeks post-cardiac catheterization.    Past Surgical History:  Past Surgical History  Procedure Date  . Coronary angioplasty with stent placement 10/2004    stenting x 2 to RCA  . Hernia repair   . Cataract extraction, bilateral   . Cardiovascular stress test 07/01/2007    EF 74%    OT Assessment/Plan/Recommendation OT Assessment Clinical Impression Statement: Pt s/p L hip fx - L bipolar hip replacement. Pt would benefit from skilled OT services to maximize indep with ADL and mobility with ADL with nec AE and DME while following posterior THR precautions to facilitate safe D/C home with wife. Pt will need a RW and 3 in 1. Pt will most likely not need HHOT after D/C.  OT Recommendation/Assessment: Patient will need skilled OT in the acute care venue OT Problem List: Decreased range of motion;Decreased knowledge of use of DME or AE;Decreased knowledge of precautions;Pain Barriers to Discharge: None OT Therapy Diagnosis : Generalized weakness OT Plan OT Frequency: Min 2X/week OT Treatment/Interventions: Self-care/ADL training;DME and/or AE instruction;Energy conservation;Therapeutic activities;Patient/family education OT Recommendation Follow Up Recommendations: None Equipment  Recommended: Rolling walker with 5" wheels;3 in 1 bedside comode Individuals Consulted Consulted and Agree with Results and Recommendations: Patient;Family member/caregiver Family Member Consulted: wife OT Goals Acute Rehab OT Goals OT Goal Formulation: With patient Time For Goal Achievement: 7 days ADL Goals Pt Will Perform Lower Body Bathing: with modified independence;with adaptive equipment ADL Goal: Lower Body Bathing - Progress: Not met Pt Will Perform Lower Body Dressing: with modified independence;with adaptive equipment ADL Goal: Lower Body Dressing - Progress: Not met Pt Will Transfer to Toilet: with modified independence;3-in-1;Maintaining hip precautions ADL Goal: Toilet Transfer - Progress: Not met Pt Will Perform Toileting - Clothing Manipulation: with modified independence;Standing ADL Goal: Toileting - Clothing Manipulation - Progress: Not met Pt Will Perform Tub/Shower Transfer: Shower transfer;with modified independence;Ambulation;with DME;Grab bars;Maintaining hip precautions ADL Goal: Tub/Shower Transfer - Progress: Not met Miscellaneous OT Goals Miscellaneous OT Goal #1: Pt will state 3/3 hip precautions independently. OT Goal: Miscellaneous Goal #1 - Progress: Not met  OT Evaluation Precautions/Restrictions  Precautions Precautions: Posterior Hip Precaution Booklet Issued: Yes (comment) Restrictions Weight Bearing Restrictions: Yes LLE Weight Bearing: Weight bearing as tolerated Prior Functioning Home Living Lives With: Spouse Receives Help From: Family Type of Home: House Home Layout: One level Home Access: Stairs to enter [employer] Shower/Tub: Walk-in shower;Door [employer] Toilet: Standard Bathroom Accessibility: Yes How Accessible: Accessible via walker Home Adaptive Equipment: None Prior Function Level of Independence: Independent with basic ADLs;Independent with homemaking with ambulation Able to Take Stairs?: Yes Driving: Yes Vocation:  Retired ADL ADL Eating/Feeding: Performed;Independent Where Assessed - Eating/Feeding: Chair Grooming: Set up Where Assessed - Grooming: Sitting, bed Upper Body Bathing: Chest;Right arm;Left arm;Abdomen;Set up Where  Assessed - Upper Body Bathing: Sitting, bed Lower Body Bathing: Maximal assistance Where Assessed - Lower Body Bathing: Sit to stand from bed Upper Body Dressing: Set up Where Assessed - Upper Body Dressing: Sit to stand from bed Lower Body Dressing: Maximal assistance Where Assessed - Lower Body Dressing: Sit to stand from bed Toilet Transfer: Supervision/safety Toilet Transfer Method: Proofreader: Bedside commode Toileting - Clothing Manipulation: Supervision/safety Where Assessed - Glass blower/designer Manipulation: Standing Toileting - Hygiene: Independent Where Assessed - Toileting Hygiene: Standing Tub/Shower Transfer: Paramedic Details (indicate cue type and reason): vc for techniques and precautions Tub/Shower Transfer Method: Ambulating Tub/Shower Transfer Equipment: Walk in shower;Grab bars Equipment Used: Rolling walker;Long-handled sponge;Sock aid;Reacher ADL Comments: Pt educated on use of AE for ADL and availability of AE. With use of AE, pt is S to Min A with LB ADL.  Vision/Perception  Vision - History Baseline Vision: Wears glasses all the time Perception Perception: Within Functional Limits Cognition Cognition Arousal/Alertness: Awake/alert Overall Cognitive Status: Appears within functional limits for tasks assessed Orientation Level: Oriented X4   Extremity Assessment RUE Assessment RUE Assessment: Within Functional Limits LUE Assessment LUE Assessment: Within Functional Limits Mobility  Bed Mobility Bed Mobility: Yes Supine to Sit: 5: Supervision Supine to Sit Details (indicate cue type and reason): falt bed. wife assisting Transfers Transfers: Yes Sit to Stand: 5:  Supervision Sit to Stand Details (indicate cue type and reason): good use of hands Stand to Sit: 5: Supervision Exercises Total Joint Exercises Ankle Circles/Pumps: Both;10 reps;Seated Gluteal Sets: Strengthening;10 reps;Seated Hip ABduction/ADduction: AAROM;Left;10 reps;Seated Long Arc Quad: Left;10 reps;Seated End of Session OT - End of Session Equipment Utilized During Treatment: Gait belt Activity Tolerance: Patient tolerated treatment well Patient left: in chair;with call bell in reach;with family/visitor present General Behavior During Session: [facility] for tasks performed Cognition: [facility] for tasks performed   [facility] 03/11/2011, 1:17 PM

## 2011-03-11 NOTE — Progress Notes (Signed)
Subjective: 2 Days Post-Op Procedure(s) (LRB): ARTHROPLASTY BIPOLAR HIP (Left) Patient is doing great this morning.  He has already been up to the bathroom and moved his bowels.  Waiting on his breakfast.  Some soreness when putting weight on his leg yesterday but did get up with therapy - 15 Feet.  He wants to go home and states that his wife will be with him.    Objective: Vital signs in last 24 hours: Temp:  [97.4 F (36.3 C)-97.7 F (36.5 C)] 97.5 F (36.4 C) (01/06 0526) Pulse Rate:  [55-77] 77  (01/06 0526) Resp:  [18] 18  (01/06 0526) BP: (86-113)/(47-63) 105/63 mmHg (01/06 0526) SpO2:  [94 %-98 %] 98 % (01/06 0526)  Intake/Output from previous day:  Intake/Output Summary (Last 24 hours) at 03/11/11 0824 Last data filed at 03/11/11 0800  Gross per 24 hour  Intake   1470 ml  Output   1100 ml  Net    370 ml    Intake/Output this shift: Total I/O In: -  Out: 200 [Urine:200]  Labs:  [facility] 03/11/11 0611 03/10/11 0605 03/09/11 1010  HGB 9.0* 10.4* 12.4*    Basename 03/11/11 0611 03/10/11 0605  WBC 11.9* 12.0*  RBC 2.79* 3.24*  HCT 26.9* 31.1*  PLT 187 210    Basename 03/11/11 0611 03/10/11 0605  NA 135 130*  K 4.5 4.5  CL 104 100  CO2 24 20  BUN 21 17  CREATININE 1.27 1.07  GLUCOSE 119* 181*  CALCIUM 8.4 8.4    Basename 03/10/11 0605 03/09/11 1320  LABPT -- --  INR 1.15 1.15    Exam - Neurovascular intact Sensation intact distally Dressing/Incision - clean, dry, surgiseal dressing in place. Motor function intact - moving foot and toes well on exam.   Past Medical History  Diagnosis Date  . Coronary artery disease   . Hyperlipidemia   . Hypertension   . Diabetes mellitus   . BPH (benign prostatic hypertrophy)   . TIA (transient ischemic attack)     Approximately 6 weeks post-cardiac catheterization.   Postop Hyponatremia - improved. Hgb 9.0 - Iron, recheck HGB  Assessment/Plan: 2 Days Post-Op Procedure(s) (LRB): ARTHROPLASTY BIPOLAR HIP  (Left)  Up with therapy Discharge home with home health when met goals and doing OK medically.  DVT Prophylaxis - aspirin and Lovenox. WBAT to left leg. Continue therapy. Iron supplement.   Herbert Marquez 03/11/2011, 8:24 AM

## 2011-03-11 NOTE — Progress Notes (Signed)
Physical Therapy Treatment Patient Details Name: Herbert Marquez MRN: [id number] DOB: April 05, 1927 Today's Date: 03/11/2011 8:52-9:12, gt  PT Assessment/Plan  PT - Assessment/Plan Comments on Treatment Session: Pt did well and able to double gait distance from yesterday.  Needs cueing on precautions and how to incorporate into movement. PT Plan: Discharge plan remains appropriate;Frequency remains appropriate PT Frequency: Min 6X/week Follow Up Recommendations: Home health PT Equipment Recommended: Rolling walker with 5" wheels;3 in 1 bedside comode PT Goals  Acute Rehab PT Goals PT Goal: Sit to Stand - Progress: Progressing toward goal PT Goal: Stand to Sit - Progress: Progressing toward goal PT Goal: Ambulate - Progress: Progressing toward goal PT Goal: Perform Home Exercise Program - Progress: Progressing toward goal Additional Goals PT Goal: Additional Goal #1 - Progress: Progressing toward goal (Recalled 1/3 precautions without handout)  PT Treatment Precautions/Restrictions  Precautions Precautions: Posterior Hip Precaution Booklet Issued: Yes (comment) Restrictions Weight Bearing Restrictions: Yes LLE Weight Bearing: Weight bearing as tolerated Mobility (including Balance) Transfers Sit to Stand:  (MIN/GUARD) Sit to Stand Details (indicate cue type and reason): good use of hands Stand to Sit:  (Min/guard) Ambulation/Gait Ambulation/Gait: Yes Ambulation/Gait Assistance: 4: Min assist Ambulation/Gait Assistance Details (indicate cue type and reason): Improved sequencing today. Ambulation Distance (Feet): 30 Feet Assistive device: Rolling walker Gait Pattern: Step-to pattern    Exercise  Total Joint Exercises Ankle Circles/Pumps: Both;10 reps;Seated Gluteal Sets: Strengthening;10 reps;Seated Hip ABduction/ADduction: AAROM;Left;10 reps;Seated Long Arc Quad: Left;10 reps;Seated End of Session PT - End of Session Equipment Utilized During Treatment: Gait belt Activity  Tolerance: Patient tolerated treatment well Patient left: in bed;with call bell in reach Nurse Communication: Mobility status for transfers;Mobility status for ambulation General Behavior During Session: [facility] for tasks performed Cognition: [facility] for tasks performed  [facility] LUBECK 03/11/2011, 10:56 AM

## 2011-03-11 NOTE — Progress Notes (Signed)
Remains in good spirits. Ortho f/u appreciated. Some pain after PT today.  SUBJECTIVE No complaints.   1. Femoral neck fracture   2. CAD (coronary artery disease)     Past Medical History  Diagnosis Date  . Coronary artery disease   . Hyperlipidemia   . Hypertension   . Diabetes mellitus   . BPH (benign prostatic hypertrophy)   . TIA (transient ischemic attack)     Approximately 6 weeks post-cardiac catheterization.    Current Facility-Administered Medications  Medication Dose Route Frequency Provider Last Rate Last Dose  . acetaminophen (TYLENOL) tablet 650 mg  650 mg Oral Q6H PRN Dayvian Blixt       Or  . acetaminophen (TYLENOL) suppository 650 mg  650 mg Rectal Q6H PRN Saga Balthazar      . albuterol (PROVENTIL) (5 MG/ML) 0.5% nebulizer solution 2.5 mg  2.5 mg Nebulization Q4H PRN Mamie Hundertmark      . alum & mag hydroxide-simeth (MAALOX/MYLANTA) 200-200-20 MG/5ML suspension 30 mL  30 mL Oral Q4H PRN Genelle Gather Babish, PA      . aspirin chewable tablet 81 mg  81 mg Oral Daily Shelda Pal   81 mg at 03/11/11 1019  . bisacodyl (DULCOLAX) EC tablet 5 mg  5 mg Oral Daily PRN Genelle Gather Babish, PA      . diphenhydrAMINE (BENADRYL) capsule 25 mg  25 mg Oral Q6H PRN Genelle Gather Babish, PA      . docusate sodium (COLACE) capsule 100 mg  100 mg Oral BID Genelle Gather [place], PA   100 mg at 03/11/11 1020  . doxazosin (CARDURA) tablet 8 mg  8 mg Oral Daily Alysabeth Scalia   8 mg at 03/11/11 1020  . enoxaparin (LOVENOX) injection 40 mg  40 mg Subcutaneous Q24H Genelle Gather [place], [place]   40 mg at 03/11/11 0827  . ferrous sulfate tablet 325 mg  325 mg Oral TID PC Genelle Gather [place], [place]   325 mg at 03/11/11 1357  . HYDROcodone-acetaminophen (NORCO) 5-325 MG per tablet 1-2 tablet  1-2 tablet Oral Q4H PRN Nancye Grumbine      . HYDROcodone-acetaminophen (NORCO) 7.5-325 MG per tablet 1-2 tablet  1-2 tablet Oral Q4H Genelle Gather [place], [place]   1 tablet at 03/11/11 1121  . HYDROmorphone  (DILAUDID) injection 0.5-2 mg  0.5-2 mg Intravenous Q2H PRN Genelle Gather Babish, PA      . insulin aspart (novoLOG) injection 0-9 Units  0-9 Units Subcutaneous TID WC Pasha Broad      . lactated ringers infusion   Intravenous Continuous Melinda Hatchel Gilbert      . menthol-cetylpyridinium (CEPACOL) lozenge 3 mg  1 lozenge Oral PRN Genelle Gather Babish, PA       Or  . phenol (CHLORASEPTIC) mouth spray 1 spray  1 spray Mouth/Throat PRN Genelle Gather Bab[place], PA      . methocarbamol (ROBAXIN) tablet 500 mg  500 mg Oral Q6H PRN Genelle Gather Bab[place], PA       Or  . methocarbamol (ROBAXIN) 500 mg in dextrose 5 % 50 mL IVPB  500 mg Intravenous Q6H PRN Genelle Gather Bab[place], PA      . metoCLOPramide (REGLAN) tablet 5-10 mg  5-10 mg Oral Q8H PRN Genelle Gather Babish, PA       Or  . metoCLOPramide (REGLAN) injection 5-10 mg  5-10 mg Intravenous Q8H PRN Genelle Gather Babish, PA      . metoprolol (TOPROL-XL) 24 hr tablet 50 mg  50  mg Oral Daily Kenzli Barritt   50 mg at 03/11/11 1020  . morphine 2 MG/ML injection 4 mg  4 mg Intravenous Q4H PRN Mahayla Haddaway   4 mg at 03/09/11 1402  . mulitivitamin with minerals tablet 1 tablet  1 tablet Oral Daily Shelda Pal   1 tablet at 03/11/11 1020  . nitroGLYCERIN (NITROSTAT) SL tablet 0.4 mg  0.4 mg Sublingual Q5 min PRN Sherrell Farish      . omeprazole (PRILOSEC) capsule 20 mg  20 mg Oral Q1200 Alexzandrew Perkins, PA   20 mg at 03/11/11 1121  . ondansetron (ZOFRAN) tablet 4 mg  4 mg Oral Q6H PRN Meliton Samad       Or  . ondansetron (ZOFRAN) injection 4 mg  4 mg Intravenous Q6H PRN Dartanyon Frankowski   4 mg at 03/09/11 1401  . pioglitazone (ACTOS) tablet 30 mg  30 mg Oral Daily Bryona Foxworthy   30 mg at 03/11/11 1020  . polyethylene glycol (MIRALAX / GLYCOLAX) packet 17 g  17 g Oral BID Genelle Gather [place], [place]   17 g at 03/10/11 2137  . senna (SENOKOT) tablet 8.6 mg  1 tablet Oral BID Gracielynn Birkel   8.6 mg at 03/11/11 1020  . simvastatin (ZOCOR) tablet 40 mg  40  mg Oral q1800 Merridy Pascoe   40 mg at 03/10/11 1732  . sodium chloride 0.9 % 1,000 mL with potassium chloride 10 mEq infusion  100 mL/hr Intravenous Continuous Genelle Gather Babish, PA 100 mL/hr at 03/11/11 0356 100 mL/hr at 03/11/11 0356  . sodium chloride 0.9 % bolus 250 mL  250 mL Intravenous Once Zalaya Astarita   250 mL at 03/10/11 1733  . zolpidem (AMBIEN) tablet 5 mg  5 mg Oral QHS PRN Darvin Dials      . DISCONTD: 0.45 % sodium chloride infusion   Intravenous Continuous Tareva Leske 100 mL/hr at 03/09/11 2121    . DISCONTD: insulin aspart (novoLOG) injection 0-9 Units  0-9 Units Subcutaneous Q4H Xee Hollman   2 Units at 03/10/11 1202  . DISCONTD: ramipril (ALTACE) capsule 2.5 mg  2.5 mg Oral Daily Laniesha Das   2.5 mg at 03/10/11 1019   No Known Allergies Principal Problem:  *Fracture of femoral neck, left Active Problems:  ARF (acute renal failure)  DM (diabetes mellitus)  Obesity  COPD (chronic obstructive pulmonary disease)  Essential hypertension  Postop Hyponatremia   Vital signs in last 24 hours: Temp:  [97.5 F (36.4 C)-97.7 F (36.5 C)] 97.5 F (36.4 C) (01/06 0526) Pulse Rate:  [60-77] 77  (01/06 0526) Resp:  [18] 18  (01/06 0526) BP: (86-105)/(47-63) 105/63 mmHg (01/06 0526) SpO2:  [96 %-98 %] 98 % (01/06 0526) Weight change:  Last BM Date: 03/11/11  Intake/Output from previous day: 01/05 0701 - 01/06 0700 In: 1470 [P.O.:360; I.V.:1110] Out: 900 [Urine:900] Intake/Output this shift: Total I/O In: 560 [P.O.:560] Out: 600 [Urine:600]  Lab Results:  [facility] 03/11/11 0611 03/10/11 0605  WBC 11.9* 12.0*  HGB 9.0* 10.4*  HCT 26.9* 31.1*  PLT 187 210   BMET  Basename 03/11/11 0611 03/10/11 0605  NA 135 130*  K 4.5 4.5  CL 104 100  CO2 24 20  GLUCOSE 119* 181*  BUN 21 17  CREATININE 1.27 1.07  CALCIUM 8.4 8.4    Studies/Results: No results found.  Medications: I have reviewed the patient's current medications.   Physical  exam GENERAL- alert HEAD- normal atraumatic, no neck masses, normal thyroid, no  jvd RESPIRATORY- appears well, vitals normal, no respiratory distress, acyanotic, normal RR, ear and throat exam is normal, neck free of mass or lymphadenopathy, chest clear, no wheezing, crepitations, rhonchi, normal symmetric air entry CVS- regular rate and rhythm, S1, S2 normal, no murmur, click, rub or gallop ABDOMEN- abdomen is soft without significant tenderness, masses, organomegaly or guarding NEURO- Grossly normal EXTREMITIES- extremities normal, atraumatic, no cyanosis or edema  Plan .Fracture of femoral neck, left- s/p orif. Doing well. Defer mx to ortho. .ARF (acute renal failure)-resolved. Hold maxide.  .DM (diabetes mellitus)- hb a1c 5.9 .ssi/pioglitazone. .Obesity- life style changes. Marland KitchenCOPD (chronic obstructive pulmonary disease)- stable. Albuterol inhalations prn. .Essential hypertension/cad- BP better. Monitor. D/C telemetry- no arrhythmias. Follow 2decho. .leukocytosis- seems reactionary,no change, no evidence of infection. monitor.  Disposition- hopefully home in next 2 days or so.      Darrol Brandenburg 03/11/2011 2:10 PM Pager: [phone].

## 2011-03-11 NOTE — Progress Notes (Signed)
In good spirits.  Very motivated to get up and amb with walker. Tries to do what is asked of him.  Toll amb and being up in chair well.  Pain managed with po Norco.  Wife and daughters very supportive.

## 2011-03-11 NOTE — Progress Notes (Signed)
Physical Therapy Treatment Patient Details Name: Herbert Marquez MRN: [id number] DOB: 08-May-1927 Today's Date: 03/11/2011 13:52-14:15, gt, te PT Assessment/Plan  PT - Assessment/Plan Comments on Treatment Session: Pt progressing well.  Needs cues to follow precautions when turning.  Wife educated with handout on exercises.  Needs stair training tomorrow. PT Plan: Discharge plan remains appropriate;Frequency remains appropriate PT Frequency: Min 6X/week Follow Up Recommendations: Home health PT Equipment Recommended: Rolling walker with 5" wheels;3 in 1 bedside comode PT Goals  Acute Rehab PT Goals PT Goal Formulation: With patient/family Pt will go Supine/Side to Sit: with supervision PT Goal: Supine/Side to Sit - Progress: Progressing toward goal Pt will go Sit to Stand: with modified independence PT Goal: Sit to Stand - Progress: Progressing toward goal Pt will go Stand to Sit: with modified independence PT Goal: Stand to Sit - Progress: Progressing toward goal Pt will Ambulate: 51 - 150 feet;with modified independence;with rolling walker PT Goal: Ambulate - Progress: Progressing toward goal Pt will Perform Home Exercise Program: with supervision, verbal cues required/provided PT Goal: Perform Home Exercise Program - Progress: Progressing toward goal Additional Goals Additional Goal #1: Pt will be recall 3/3 hip precautions and incorporate into mobility without verbal cues. PT Goal: Additional Goal #1 - Progress: Progressing toward goal  PT Treatment Precautions/Restrictions  Precautions Precautions: Posterior Hip Precaution Booklet Issued: Yes (comment) Restrictions Weight Bearing Restrictions: Yes LLE Weight Bearing: Weight bearing as tolerated Mobility (including Balance) Bed Mobility Supine to Sit:  (min/guard) Transfers Sit to Stand: 5: Supervision Stand to Sit: 5: Supervision Ambulation/Gait Ambulation/Gait Assistance: 4: Min assist (min/guard) Ambulation/Gait  Assistance Details (indicate cue type and reason): cues with turning to follow precautions Ambulation Distance (Feet): 90 Feet Assistive device: Rolling walker Gait Pattern: Step-to pattern    Exercise  Total Joint Exercises Quad Sets: AROM;Strengthening;Left;10 reps;Supine Gluteal Sets: Supine;10 reps;Strengthening Heel Slides: AAROM;Left;10 reps;Supine Hip ABduction/ADduction: Supine;10 reps;AROM End of Session PT - End of Session Equipment Utilized During Treatment: Gait belt Activity Tolerance: Patient tolerated treatment well Patient left: in bed;with call bell in reach;with family/visitor present General Behavior During Session: [facility] for tasks performed Cognition: [facility] for tasks performed  [facility] LUBECK 03/11/2011, 4:50 PM

## 2011-03-12 LAB — BASIC METABOLIC PANEL
BUN: 17 mg/dL (ref 6–23)
CO2: 25 mEq/L (ref 19–32)
Calcium: 8.5 mg/dL (ref 8.4–10.5)
Chloride: 102 mEq/L (ref 96–112)
Creatinine, Ser: 1.15 mg/dL (ref 0.50–1.35)
GFR calc Af Amer: 67 mL/min — ABNORMAL LOW (ref >90)
GFR calc non Af Amer: 58 mL/min — ABNORMAL LOW (ref >90)
Glucose, Bld: 113 mg/dL — ABNORMAL HIGH (ref 70–99)
Potassium: 3.9 mEq/L (ref 3.5–5.1)
Sodium: 134 mEq/L — ABNORMAL LOW (ref 135–145)

## 2011-03-12 LAB — CBC
HCT: 26.4 % — ABNORMAL LOW (ref 39.0–52.0)
Hemoglobin: 8.8 g/dL — ABNORMAL LOW (ref 13.0–17.0)
MCH: 32.2 pg (ref 26.0–34.0)
MCHC: 33.3 g/dL (ref 30.0–36.0)
MCV: 96.7 fL (ref 78.0–100.0)
Platelets: 187 10*3/uL (ref 150–400)
RBC: 2.73 MIL/uL — ABNORMAL LOW (ref 4.22–5.81)
RDW: 15.1 % (ref 11.5–15.5)
WBC: 7.9 10*3/uL (ref 4.0–10.5)

## 2011-03-12 LAB — GLUCOSE, CAPILLARY
Glucose-Capillary: 109 mg/dL — ABNORMAL HIGH (ref 70–99)
Glucose-Capillary: 118 mg/dL — ABNORMAL HIGH (ref 70–99)
Glucose-Capillary: 133 mg/dL — ABNORMAL HIGH (ref 70–99)
Glucose-Capillary: 126 mg/dL — ABNORMAL HIGH (ref 70–99)

## 2011-03-12 NOTE — Progress Notes (Signed)
Patient ID: Herbert Marquez, male   DOB: 1927/10/06, 78 y.o.   MRN: [id number] Subjective: 3 Days Post-Op Procedure(s) (LRB): ARTHROPLASTY BIPOLAR HIP (Left)    Patient reports pain as mild.  Objective:   VITALS:   Filed Vitals:   03/12/11 0600  BP: 94/50  Pulse: 67  Temp: 98.2 F (36.8 C)  Resp: 18    Neurovascular intact Incision: dressing C/D/I  LABS  Basename 03/12/11 0425 03/11/11 0611 03/10/11 0605  HGB 8.8* 9.0* 10.4*  HCT 26.4* 26.9* 31.1*  WBC 7.9 11.9* 12.0*  PLT 187 187 210     Basename 03/12/11 0425 03/11/11 0611 03/10/11 0605  NA 134* 135 130*  K 3.9 4.5 4.5  BUN 17 21 17   CREATININE 1.15 1.27 1.07  GLUCOSE 113* 119* 181*     Basename 03/10/11 0605 03/09/11 1320  LABPT -- --  INR 1.15 1.15     Assessment/Plan: 3 Days Post-Op Procedure(s) (LRB): ARTHROPLASTY BIPOLAR HIP (Left)   Up with therapy Discharge home with home health  Patient Orthopedically stable to be discharged home with HHPT Continue Lovenox 40mg  SQ daily for 7 days, then aspirin 325mg  daily for 4 weeks Maintain current dressing for 8 days post op, this is water proof and then can remove and cover with gauze and tape. RTC, [employer], [place], in 2weeks, [phone] Please call with any other questions, [phone] or [phone]

## 2011-03-12 NOTE — Progress Notes (Signed)
Patient ID: Herbert Marquez, male   DOB: [birth year], 78 y.o.   MRN: [id number]  SUBJECTIVE  Patient reports no chest pain. He has had his surgery 48 hrs ago . He reports no orthopnea PND. There are no heart failure symptoms. Is feeling quite well. I checked his telemetry and is in normal sinus rhythm. No active cardiac issues.  Principal Problem:  *Fracture of femoral neck, left Active Problems:  ARF (acute renal failure)  DM (diabetes mellitus)  Obesity  COPD (chronic obstructive pulmonary disease)  Essential hypertension  Postop Hyponatremia   Past Medical History  Diagnosis Date  . Coronary artery disease   . Hyperlipidemia   . Hypertension   . Diabetes mellitus   . BPH (benign prostatic hypertrophy)   . TIA (transient ischemic attack)     Approximately 6 weeks post-cardiac catheterization.     Current Facility-Administered Medications  Medication Dose Route Frequency Provider Last Rate Last Dose  . acetaminophen (TYLENOL) tablet 650 mg  650 mg Oral Q6H PRN Simbiso Ranga       Or  . acetaminophen (TYLENOL) suppository 650 mg  650 mg Rectal Q6H PRN Simbiso Ranga      . albuterol (PROVENTIL) (5 MG/ML) 0.5% nebulizer solution 2.5 mg  2.5 mg Nebulization Q4H PRN Simbiso Ranga      . alum & mag hydroxide-simeth (MAALOX/MYLANTA) 200-200-20 MG/5ML suspension 30 mL  30 mL Oral Q4H PRN Genelle Gather Babish, PA      . aspirin chewable tablet 81 mg  81 mg Oral Daily Shelda Pal   81 mg at 03/11/11 1019  . bisacodyl (DULCOLAX) EC tablet 5 mg  5 mg Oral Daily PRN Genelle Gather Babish, PA      . diphenhydrAMINE (BENADRYL) capsule 25 mg  25 mg Oral Q6H PRN Genelle Gather Babish, PA      . docusate sodium (COLACE) capsule 100 mg  100 mg Oral BID Genelle Gather [place], [place]   100 mg at 03/11/11 2138  . doxazosin (CARDURA) tablet 8 mg  8 mg Oral Daily Simbiso Ranga   8 mg at 03/11/11 1020  . enoxaparin (LOVENOX) injection 40 mg  40 mg Subcutaneous Q24H Genelle Gather [place], [place]   40 mg at  03/11/11 0827  . ferrous sulfate tablet 325 mg  325 mg Oral TID PC Genelle Gather [place], [place]   325 mg at 03/11/11 1731  . HYDROcodone-acetaminophen (NORCO) 5-325 MG per tablet 1-2 tablet  1-2 tablet Oral Q4H PRN Simbiso Ranga      . HYDROcodone-acetaminophen (NORCO) 7.5-325 MG per tablet 1-2 tablet  1-2 tablet Oral Q4H Genelle Gather [place], [place]   1 tablet at 03/12/11 0519  . HYDROmorphone (DILAUDID) injection 0.5-2 mg  0.5-2 mg Intravenous Q2H PRN Genelle Gather Babish, PA      . insulin aspart (novoLOG) injection 0-9 Units  0-9 Units Subcutaneous TID WC Simbiso Ranga      . menthol-cetylpyridinium (CEPACOL) lozenge 3 mg  1 lozenge Oral PRN Genelle Gather Babish, PA       Or  . phenol (CHLORASEPTIC) mouth spray 1 spray  1 spray Mouth/Throat PRN Genelle Gather Babish, PA      . methocarbamol (ROBAXIN) tablet 500 mg  500 mg Oral Q6H PRN Genelle Gather Babish, PA       Or  . methocarbamol (ROBAXIN) 500 mg in dextrose 5 % 50 mL IVPB  500 mg Intravenous Q6H PRN Genelle Gather Babish, PA      . metoCLOPramide (REGLAN) tablet 5-10  mg  5-10 mg Oral Q8H PRN Genelle Gather [place], PA       Or  . metoCLOPramide (REGLAN) injection 5-10 mg  5-10 mg Intravenous Q8H PRN Genelle Gather Babish, PA      . metoprolol (TOPROL-XL) 24 hr tablet 50 mg  50 mg Oral Daily Simbiso Ranga   50 mg at 03/11/11 1020  . morphine 2 MG/ML injection 4 mg  4 mg Intravenous Q4H PRN Simbiso Ranga   4 mg at 03/09/11 1402  . mulitivitamin with minerals tablet 1 tablet  1 tablet Oral Daily Shelda Pal   1 tablet at 03/11/11 1020  . nitroGLYCERIN (NITROSTAT) SL tablet 0.4 mg  0.4 mg Sublingual Q5 min PRN Simbiso Ranga      . omeprazole (PRILOSEC) capsule 20 mg  20 mg Oral Q1200 Alexzandrew Perkins, PA   20 mg at 03/11/11 1121  . ondansetron (ZOFRAN) tablet 4 mg  4 mg Oral Q6H PRN Simbiso Ranga       Or  . ondansetron (ZOFRAN) injection 4 mg  4 mg Intravenous Q6H PRN Simbiso Ranga   4 mg at 03/09/11 1401  . pioglitazone (ACTOS) tablet 30 mg   30 mg Oral Daily Simbiso Ranga   30 mg at 03/11/11 1020  . polyethylene glycol (MIRALAX / GLYCOLAX) packet 17 g  17 g Oral BID Genelle Gather [place], [place]   17 g at 03/11/11 2138  . senna (SENOKOT) tablet 8.6 mg  1 tablet Oral BID Simbiso Ranga   8.6 mg at 03/11/11 1020  . simvastatin (ZOCOR) tablet 40 mg  40 mg Oral q1800 Simbiso Ranga   40 mg at 03/11/11 1731  . zolpidem (AMBIEN) tablet 5 mg  5 mg Oral QHS PRN Simbiso Ranga      . DISCONTD: lactated ringers infusion   Intravenous Continuous Melinda Hatchel Gilbert      . DISCONTD: sodium chloride 0.9 % 1,000 mL with potassium chloride 10 mEq infusion  100 mL/hr Intravenous Continuous Genelle Gather [place], PA 100 mL/hr at 03/11/11 0356 100 mL/hr at 03/11/11 0356    LABS: Basic Metabolic Panel:  Basename 03/12/11 0425 03/11/11 0611  NA 134* 135  K 3.9 4.5  CL 102 104  CO2 25 24  GLUCOSE 113* 119*  BUN 17 21  CREATININE 1.15 1.27  CALCIUM 8.5 8.4  MG -- --  PHOS -- --   Liver Function Tests:  [facility] 03/10/11 0605  AST 32  ALT 13  ALKPHOS 60  BILITOT 0.3  PROT 5.9*  ALBUMIN 2.9*   No results found for this basename: LIPASE:2,AMYLASE:2 in the last 72 hours CBC:  Basename 03/12/11 0425 03/11/11 0611 03/09/11 1010  WBC 7.9 11.9* --  NEUTROABS -- -- 5.9  HGB 8.8* 9.0* --  HCT 26.4* 26.9* --  MCV 96.7 96.4 --  PLT 187 187 --   Hemoglobin A1C:  Basename 03/09/11 1010  HGBA1C 5.9*   Fasting Lipid Panel: No results found for this basename: CHOL,HDL,LDLCALC,TRIG,CHOLHDL,LDLDIRECT in the last 72 hours Thyroid Function Tests:  Basename 03/09/11 1320  TSH 1.789  T4TOTAL --  T3FREE --  THYROIDAB --    RADIOLOGY: No results found.  PHYSICAL EXAM  Filed Vitals:   03/11/11 1423 03/11/11 2300 03/12/11 0600 03/12/11 0700  BP: 96/57 115/55 94/50   Pulse: 68 68 67   Temp: 98.1 F (36.7 C) 98 F (36.7 C) 98.2 F (36.8 C)   TempSrc: Oral Oral Oral   Resp: 18 18 18    Height:  Weight:      SpO2: 98% 95% 90% 95%     BP 94/50  Pulse 67  Temp(Src) 98.2 F (36.8 C) (Oral)  Resp 18  Ht 5\' 7"  (1.702 m)  Wt 91.2 kg (201 lb 1 oz)  BMI 31.49 kg/m2  SpO2 95%  Intake/Output Summary (Last 24 hours) at 03/12/11 0746 Last data filed at 03/12/11 0700  Gross per 24 hour  Intake   1000 ml  Output   1400 ml  Net   -400 ml   I/O last 3 completed shifts: In: 1000 [P.O.:800; I.V.:200] Out: 2200 [Urine:2200] General: well- nourished. No distress HEENT: normal carotid upstroke. Normal JVP. No thyromegaly Cardiac: RRR, NL S1/S2. No pathologic murmurs Lungs: clear BS bilaterally, no wheezing. Abdomen, soft, non- tender Extremities. No edema. Normal distal pulses Skin: warm and dry Psychologic: normal affect.   TELEMETRY: Reviewed telemetry pt in normal sinus rhythm by handheld single-lead telemetry  ASSESSMENT AND PLAN: 1. Femoral neck fracture   2. CAD (coronary artery disease)     Patient Active Problem List  Diagnoses  . CAD (coronary artery disease)-status post stent to the RCA x2-3 years ago, prior ejection fraction 50%   . Diabetes mellitus  . HTN (hypertension)  . Fracture of femoral neck, left-status post arthroplasty bipolar hip   . ARF (acute renal failure)  . DM (diabetes mellitus)  . Obesity  . COPD (chronic obstructive pulmonary disease)  . Essential hypertension     PLAN  Appears ready for D/C with home health   From a cardiac standpoint the patient is doing quite well. He reports no chest pain and there are no heart failure symptoms.  The patient is resumed on his aspirin and is on DVT prophylaxis with Lovenox.  His vital signs are stable and we have no further recommendation regarding cardiac medical therapy.  Blood sugar has been elevated and he should likely be covered. We will leave this to the primary team.  Will sign off  Charlton Haws 03/12/2011, 7:46 AM

## 2011-03-12 NOTE — Progress Notes (Signed)
Physical Therapy Treatment Patient Details Name: Herbert Marquez MRN: [id number] DOB: 08-06-1927 Today's Date: 03/12/2011 10:19-10:39, gt  PT Assessment/Plan  PT - Assessment/Plan Comments on Treatment Session: Pt did well with stair training with crutch and 1 rail.  Pt c/o nausea today.  BPT130/69 and after gait oxygen 93% HR 78.  Deferred ther ex, but both pt and wife verbalized understanding. Reviewed precautions. PT Plan: Discharge plan remains appropriate;Frequency remains appropriate Follow Up Recommendations: Home health PT Equipment Recommended: Rolling walker with 5" wheels;3 in 1 bedside comode PT Goals  Acute Rehab PT Goals PT Goal: Sit to Stand - Progress: Met PT Goal: Stand to Sit - Progress: Met PT Goal: Ambulate - Progress: Progressing toward goal Pt will Go Up / Down Stairs: 1-2 stairs;with min assist;with rail(s);with least restrictive assistive device PT Goal: Up/Down Stairs - Progress: Met Pt will Perform Home Exercise Program: with supervision, verbal cues required/provided PT Goal: Perform Home Exercise Program - Progress: Progressing toward goal Additional Goals Additional Goal #1: Pt will be recall 3/3 hip precautions and incorporate into mobility without verbal cues. PT Goal: Additional Goal #1 - Progress: Met  PT Treatment Precautions/Restrictions  Precautions Precautions: Posterior Hip Precaution Booklet Issued: Yes (comment) Restrictions Weight Bearing Restrictions: Yes LLE Weight Bearing: Weight bearing as tolerated Mobility (including Balance) Bed Mobility Bed Mobility: Yes Sit to Supine - Right: 4: Min assist Sit to Supine - Right Details (indicate cue type and reason): A for R LE Transfers Sit to Stand: 6: Modified independent (Device/Increase time) Stand to Sit: 6: Modified independent (Device/Increase time) Ambulation/Gait Ambulation/Gait Assistance: 5: Supervision (min/guard to S) Ambulation/Gait Assistance Details (indicate cue type and  reason): good recall of turning correct direction to follow precautions Ambulation Distance (Feet): 75 Feet Assistive device: Rolling walker Gait Pattern: Step-to pattern Stairs: Yes Stairs Assistance:  (min/guard) Stair Management Technique: One rail Right;One rail Left;Forwards;With crutches Number of Stairs: 2  (x 2 reps)    Exercise    End of Session PT - End of Session Equipment Utilized During Treatment: Gait belt Activity Tolerance: Patient tolerated treatment well (Pt feeling nauseous.) Patient left: in bed;with call bell in reach;with family/visitor present General Behavior During Session: [facility] for tasks performed Cognition: [facility] for tasks performed  [facility] LUBECK 03/12/2011, 11:11 AM

## 2011-03-12 NOTE — Progress Notes (Signed)
Appreciate ortho. SUBJECTIVE Nauseated this morning.   1. Femoral neck fracture   2. CAD (coronary artery disease)     Past Medical History  Diagnosis Date  . Coronary artery disease   . Hyperlipidemia   . Hypertension   . Diabetes mellitus   . BPH (benign prostatic hypertrophy)   . TIA (transient ischemic attack)     Approximately 6 weeks post-cardiac catheterization.    Current Facility-Administered Medications  Medication Dose Route Frequency Provider Last Rate Last Dose  . acetaminophen (TYLENOL) tablet 650 mg  650 mg Oral Q6H PRN Detavious Rinn       Or  . acetaminophen (TYLENOL) suppository 650 mg  650 mg Rectal Q6H PRN Errin Whitelaw      . albuterol (PROVENTIL) (5 MG/ML) 0.5% nebulizer solution 2.5 mg  2.5 mg Nebulization Q4H PRN Damont Balles      . alum & mag hydroxide-simeth (MAALOX/MYLANTA) 200-200-20 MG/5ML suspension 30 mL  30 mL Oral Q4H PRN Genelle Gather Babish, PA      . aspirin chewable tablet 81 mg  81 mg Oral Daily Shelda Pal   81 mg at 03/12/11 4098  . bisacodyl (DULCOLAX) EC tablet 5 mg  5 mg Oral Daily PRN Genelle Gather Babish, PA      . diphenhydrAMINE (BENADRYL) capsule 25 mg  25 mg Oral Q6H PRN Genelle Gather Babish, PA      . docusate sodium (COLACE) capsule 100 mg  100 mg Oral BID Genelle Gather [place], [place]   100 mg at 03/12/11 0905  . doxazosin (CARDURA) tablet 8 mg  8 mg Oral Daily Daphanie Oquendo   8 mg at 03/12/11 0905  . enoxaparin (LOVENOX) injection 40 mg  40 mg Subcutaneous Q24H Genelle Gather [place], [place]   40 mg at 03/12/11 0904  . ferrous sulfate tablet 325 mg  325 mg Oral TID PC Genelle Gather [place], [place]   325 mg at 03/12/11 1215  . HYDROcodone-acetaminophen (NORCO) 5-325 MG per tablet 1-2 tablet  1-2 tablet Oral Q4H PRN Julia Alkhatib      . HYDROcodone-acetaminophen (NORCO) 7.5-325 MG per tablet 1-2 tablet  1-2 tablet Oral Q4H Genelle Gather [employer], [place]   1 tablet at 03/12/11 0519  . HYDROmorphone (DILAUDID) injection 0.5-2 mg  0.5-2 mg Intravenous  Q2H PRN Genelle Gather Babish, PA      . insulin aspart (novoLOG) injection 0-9 Units  0-9 Units Subcutaneous TID WC Kaliyah Gladman      . menthol-cetylpyridinium (CEPACOL) lozenge 3 mg  1 lozenge Oral PRN Genelle Gather Babish, PA       Or  . phenol (CHLORASEPTIC) mouth spray 1 spray  1 spray Mouth/Throat PRN Genelle Gather Babish, PA      . methocarbamol (ROBAXIN) tablet 500 mg  500 mg Oral Q6H PRN Genelle Gather Babish, PA       Or  . methocarbamol (ROBAXIN) 500 mg in dextrose 5 % 50 mL IVPB  500 mg Intravenous Q6H PRN Genelle Gather Babish, PA      . metoCLOPramide (REGLAN) tablet 5-10 mg  5-10 mg Oral Q8H PRN Genelle Gather Babish, PA       Or  . metoCLOPramide (REGLAN) injection 5-10 mg  5-10 mg Intravenous Q8H PRN Genelle Gather [place], [place]   10 mg at 03/12/11 0905  . metoprolol (TOPROL-XL) 24 hr tablet 50 mg  50 mg Oral Daily Jeorgia Helming   50 mg at 03/12/11 0905  . morphine 2 MG/ML injection 4 mg  4 mg  Intravenous Q4H PRN Moreen Piggott   4 mg at 03/09/11 1402  . mulitivitamin with minerals tablet 1 tablet  1 tablet Oral Daily Shelda Pal   1 tablet at 03/12/11 1610  . nitroGLYCERIN (NITROSTAT) SL tablet 0.4 mg  0.4 mg Sublingual Q5 min PRN Jahquan Klugh      . omeprazole (PRILOSEC) capsule 20 mg  20 mg Oral Q1200 Alexzandrew Perkins, PA   20 mg at 03/12/11 1214  . ondansetron (ZOFRAN) tablet 4 mg  4 mg Oral Q6H PRN Riata Ikeda       Or  . ondansetron (ZOFRAN) injection 4 mg  4 mg Intravenous Q6H PRN Bayler Gehrig   4 mg at 03/09/11 1401  . pioglitazone (ACTOS) tablet 30 mg  30 mg Oral Daily Adarian Bur   30 mg at 03/12/11 0905  . polyethylene glycol (MIRALAX / GLYCOLAX) packet 17 g  17 g Oral BID Genelle Gather [place], [place]   17 g at 03/11/11 2138  . senna (SENOKOT) tablet 8.6 mg  1 tablet Oral BID Zamia Tyminski   8.6 mg at 03/12/11 0905  . simvastatin (ZOCOR) tablet 40 mg  40 mg Oral q1800 Taiven Greenley   40 mg at 03/11/11 1731  . zolpidem (AMBIEN) tablet 5 mg  5 mg Oral QHS PRN Saksham Akkerman       No Known Allergies Principal Problem:  *Fracture of femoral neck, left Active Problems:  ARF (acute renal failure)  DM (diabetes mellitus)  Obesity  COPD (chronic obstructive pulmonary disease)  Essential hypertension  Postop Hyponatremia   Vital signs in last 24 hours: Temp:  [98 F (36.7 C)-98.3 F (36.8 C)] 98.3 F (36.8 C) (01/07 1438) Pulse Rate:  [67-68] 67  (01/07 1438) Resp:  [18] 18  (01/07 1438) BP: (94-125)/(50-66) 125/66 mmHg (01/07 1438) SpO2:  [90 %-95 %] 94 % (01/07 1438) Weight change:  Last BM Date: 03/11/11  Intake/Output from previous day: 01/06 0701 - 01/07 0700 In: 1000 [P.O.:800; I.V.:200] Out: 1400 [Urine:1400] Intake/Output this shift: Total I/O In: 360 [P.O.:360] Out: 200 [Urine:200]  Lab Results:  Basename 03/12/11 0425 03/11/11 0611  WBC 7.9 11.9*  HGB 8.8* 9.0*  HCT 26.4* 26.9*  PLT 187 187   BMET  Basename 03/12/11 0425 03/11/11 0611  NA 134* 135  K 3.9 4.5  CL 102 104  CO2 25 24  GLUCOSE 113* 119*  BUN 17 21  CREATININE 1.15 1.27  CALCIUM 8.5 8.4    Studies/Results: No results found.  Medications: I have reviewed the patient's current medications.   Physical exam GENERAL- alert HEAD- normal atraumatic, no neck masses, normal thyroid, no jvd RESPIRATORY- appears well, vitals normal, no respiratory distress, acyanotic, normal RR, ear and throat exam is normal, neck free of mass or lymphadenopathy, chest clear, no wheezing, crepitations, rhonchi, normal symmetric air entry CVS- regular rate and rhythm, S1, S2 normal, no murmur, click, rub or gallop ABDOMEN- abdomen is soft without significant tenderness, masses, organomegaly or guarding NEURO- Grossly normal EXTREMITIES- extremities normal, atraumatic, no cyanosis or edema  Plan .Fracture of femoral neck, left- s/p orif. Doing well. Continue PT per ortho. .DM (diabetes mellitus)- hb a1c 5.9 .ssi/pioglitazone. .Obesity- life style changes. Marland KitchenCOPD (chronic  obstructive pulmonary disease)- stable. Albuterol inhalations prn. .Essential hypertension/cad- BP controlled. Disposition-  home in am      Herbert Marquez 03/12/2011 4:29 PM Pager: [phone].

## 2011-03-12 NOTE — Progress Notes (Signed)
03-12-11 Spoke with Mr Wyne and wife at bedside to discuss dc plans regarding home health. Agreeable to [facility] PT/RN services upon dc. Chose [facility] for [facility] services and rolling walker and 3 in 1. Left vm for Norberta Keens with [facility] to make aware of referral for PT/OT services. Spoke with [place] with [facility]/DME regarding referral for 3 in 1 and rw. PCP: Dr. Shary Decamp. No further needs asssessed.  [address] RN,BSN, [place] [phone]

## 2011-03-13 ENCOUNTER — Encounter (HOSPITAL_COMMUNITY): Payer: Self-pay | Admitting: Orthopedic Surgery

## 2011-03-13 LAB — CBC
HCT: 26.3 % — ABNORMAL LOW (ref 39.0–52.0)
Hemoglobin: 8.9 g/dL — ABNORMAL LOW (ref 13.0–17.0)
MCH: 32.2 pg (ref 26.0–34.0)
MCHC: 33.8 g/dL (ref 30.0–36.0)
MCV: 95.3 fL (ref 78.0–100.0)
Platelets: 197 10*3/uL (ref 150–400)
RBC: 2.76 MIL/uL — ABNORMAL LOW (ref 4.22–5.81)
RDW: 14.6 % (ref 11.5–15.5)
WBC: 7 10*3/uL (ref 4.0–10.5)

## 2011-03-13 LAB — TYPE AND SCREEN
ABO/RH(D): A POS
Antibody Screen: NEGATIVE
Unit division: 0
Unit division: 0

## 2011-03-13 LAB — GLUCOSE, CAPILLARY
Glucose-Capillary: 101 mg/dL — ABNORMAL HIGH (ref 70–99)
Glucose-Capillary: 106 mg/dL — ABNORMAL HIGH (ref 70–99)

## 2011-03-13 MED ORDER — ENOXAPARIN SODIUM 40 MG/0.4ML ~~LOC~~ SOLN: 40.0000 mg | SUBCUTANEOUS | Status: DC

## 2011-03-13 MED ORDER — SENNA 8.6 MG PO TABS: 1.0000 | ORAL_TABLET | Freq: Two times a day (BID) | ORAL | Status: DC

## 2011-03-13 MED ORDER — HYDROCODONE-ACETAMINOPHEN 5-325 MG PO TABS: 1.0000 | ORAL_TABLET | ORAL | Status: AC | PRN

## 2011-03-13 MED ORDER — METHOCARBAMOL 500 MG PO TABS: 500.0000 mg | ORAL_TABLET | Freq: Four times a day (QID) | ORAL | Status: AC | PRN

## 2011-03-13 MED ORDER — FERROUS SULFATE 325 (65 FE) MG PO TABS: 325.0000 mg | ORAL_TABLET | Freq: Three times a day (TID) | ORAL | Status: DC

## 2011-03-13 MED ORDER — ENOXAPARIN (LOVENOX) PATIENT EDUCATION KIT: PACK | Freq: Once | Status: DC

## 2011-03-13 NOTE — Discharge Summary (Signed)
DISCHARGE SUMMARY  DAXON KYNE  MR#: [id number]  DOB:1927-04-02  Date of Admission: 03/09/2011 Date of Discharge: 03/13/2011  Attending Physician:Angelie Kram  Patient's NWG:NFAOZHYQM,VHQIO, MD, MD  Consults: -Dr Warren Lacy                    Dr Maisie Fus Stuckey-cardiology.   Discharge Diagnoses: Present on Admission:  .Fracture of femoral neck, left .ARF (acute renal failure) .DM (diabetes mellitus) .Obesity .COPD (chronic obstructive pulmonary disease) .Essential hypertension Acute blood loss anemia    Current Discharge Medication List    START taking these medications   Details  enoxaparin (LOVENOX) 40 MG/0.4ML SOLN Inject 0.4 mLs (40 mg total) into the skin daily. Qty: 11.2 mL, Refills: 0    ferrous sulfate 325 (65 FE) MG tablet Take 1 tablet (325 mg total) by mouth 3 (three) times daily after meals. Qty: 60 tablet, Refills: 0    HYDROcodone-acetaminophen (NORCO) 5-325 MG per tablet Take 1-2 tablets by mouth every 4 (four) hours as needed. Qty: 30 tablet, Refills: 0    methocarbamol (ROBAXIN) 500 MG tablet Take 1 tablet (500 mg total) by mouth every 6 (six) hours as needed. Qty: 30 tablet, Refills: 0    senna (SENOKOT) 8.6 MG TABS Take 1 tablet (8.6 mg total) by mouth 2 (two) times daily. Qty: 20 tablet, Refills: 0      CONTINUE these medications which have NOT CHANGED   Details  aspirin 81 MG tablet Take 81 mg by mouth daily.      atorvastatin (LIPITOR) 40 MG tablet Take 40 mg by mouth daily.     diphenhydrAMINE (BENADRYL) 25 MG tablet Take 25 mg by mouth every 6 (six) hours as needed.      doxazosin (CARDURA) 4 MG tablet Take 8 mg by mouth daily.      metoprolol (TOPROL-XL) 100 MG 24 hr tablet Take 50 mg by mouth daily.     Multiple Vitamin (MULTIVITAMIN) tablet Take 1 tablet by mouth daily.      omeprazole (PRILOSEC) 20 MG capsule Take 20 mg by mouth daily.      pioglitazone (ACTOS) 30 MG tablet Take 30 mg by mouth daily.      ramipril  (ALTACE) 2.5 MG capsule Take 2.5 mg by mouth daily.      triamterene-hydrochlorothiazide (MAXZIDE) 75-50 MG per tablet TAKE ONE-HALF TABLET BY MOUTH EVERY DAY Qty: 15 tablet, Refills: 3    nitroGLYCERIN (NITROSTAT) 0.4 MG SL tablet Place 1 tablet (0.4 mg total) under the tongue every 5 (five) minutes as needed. Qty: 25 tablet, Refills: 12         Hospital Course: This pleasant gentleman had a mechanical fall at home resulting in left femoral neck fracture. He is s/p arthroplasty by Dr Charlann Boxer. He was seen by Dr Riley Kill for preop clearance. He has done well post op and prefers to go home to the care of his lovely wife. He wil get physical therapy at home. He had some acute blood loss anemia but is hemodynamically stable, Hb 8.9 today. He will follow ortho as scheduled.   Day of Discharge BP 103/60  Pulse 80  Temp(Src) 98.9 F (37.2 C) (Oral)  Resp 20  Ht 5\' 7"  (1.702 m)  Wt 91.2 kg (201 lb 1 oz)  BMI 31.49 kg/m2  SpO2 94%  Physical Exam: Not in distress.  Results for orders placed during the hospital encounter of 03/09/11 (from the past 24 hour(s))  GLUCOSE, CAPILLARY     Status: Abnormal  Collection Time   03/12/11  4:56 PM      Component Value Range   Glucose-Capillary 133 (*) 70 - 99 (mg/dL)  GLUCOSE, CAPILLARY     Status: Abnormal   Collection Time   03/12/11 10:17 PM      Component Value Range   Glucose-Capillary 126 (*) 70 - 99 (mg/dL)  CBC     Status: Abnormal   Collection Time   03/13/11  4:45 AM      Component Value Range   WBC 7.0  4.0 - 10.5 (K/uL)   RBC 2.76 (*) 4.22 - 5.81 (MIL/uL)   Hemoglobin 8.9 (*) 13.0 - 17.0 (g/dL)   HCT 45.4 (*) 09.8 - 52.0 (%)   MCV 95.3  78.0 - 100.0 (fL)   MCH 32.2  26.0 - 34.0 (pg)   MCHC 33.8  30.0 - 36.0 (g/dL)   RDW 11.9  14.7 - 82.9 (%)   Platelets 197  150 - 400 (K/uL)  GLUCOSE, CAPILLARY     Status: Abnormal   Collection Time   03/13/11  7:38 AM      Component Value Range   Glucose-Capillary 101 (*) 70 - 99 (mg/dL)    Comment 1 Notify RN     Comment 2 Documented in Chart    GLUCOSE, CAPILLARY     Status: Abnormal   Collection Time   03/13/11 11:45 AM      Component Value Range   Glucose-Capillary 106 (*) 70 - 99 (mg/dL)   Comment 1 Notify RN     Comment 2 Documented in Chart      Disposition: home with home health services today.   Follow-up Appts: Discharge Orders    Future Appointments: Provider: Department: Dept Phone: Center:   04/12/2011 9:15 AM Elyn Aquas., MD [facility] [phone] None     Future Orders Please Complete By Expires   Diet - low sodium heart healthy      Increase activity slowly         Follow-up Information    Follow up with Thayer Headings, MD. Make an appointment in 1 week.   Contact information:   [address], [place], [place] [address]  [phone]        Follow up with [facility]. [facility] PT[facility]OT [facility])    Contact information:   [address] [place], [place] [address] [phone]         Tests Needing Follow-up: cbc  Time spent in discharge (includes decision making & examination of pt): 25 minutes  Signed: Joakim Huesman 03/13/2011, 1:41 PM

## 2011-03-13 NOTE — Progress Notes (Signed)
Physical Therapy Treatment Patient Details Name: Herbert Marquez MRN: [id number] DOB: 1927/10/05 Today's Date: 03/13/2011 Time: [id number] Charge: Herbert Marquez PT Assessment/Plan  PT - Assessment/Plan Comments on Treatment Session: Pt did very well with therapy today.  Pt able to tolerate increased ambulation distance.  Pt verbalized and demonstrated all hip precautions.  Pt able to recall exercises as well.  Discussed car transfer with pt and spouse.  Pt to d/c home today.  No quuestions or concerns from pt or spouse. PT Plan: Discharge plan remains appropriate;Frequency remains appropriate Follow Up Recommendations: Home health PT Equipment Recommended: Rolling walker with 5" wheels;3 in 1 bedside comode PT Goals  Acute Rehab PT Goals PT Goal: Supine/Side to Sit - Progress: Met PT Goal: Sit to Stand - Progress: Met PT Goal: Stand to Sit - Progress: Met PT Goal: Ambulate - Progress: Progressing toward goal PT Goal: Perform Home Exercise Program - Progress: Met Additional Goals PT Goal: Additional Goal #1 - Progress: Met  PT Treatment Precautions/Restrictions  Precautions Precautions: Posterior Hip Precaution Booklet Issued: Yes (comment) Restrictions Weight Bearing Restrictions: Yes LLE Weight Bearing: Weight bearing as tolerated Mobility (including Balance) Bed Mobility Bed Mobility: Yes Supine to Sit: 6: Modified independent (Device/Increase time) Supine to Sit Details (indicate cue type and reason): increased time, HOB 25* Transfers Transfers: Yes Sit to Stand: 6: Modified independent (Device/Increase time) Stand to Sit: 6: Modified independent (Device/Increase time) Ambulation/Gait Ambulation/Gait: Yes Ambulation/Gait Assistance: 5: Supervision Ambulation/Gait Assistance Details (indicate cue type and reason): verbal cue for safe RW distance and posture Ambulation Distance (Feet): 340 Feet Assistive device: Rolling walker Gait Pattern: Step-through pattern    Exercise  Total  Joint Exercises Ankle Circles/Pumps: Both;10 reps;Seated Gluteal Sets: Supine;10 reps;Strengthening Hip ABduction/ADduction: Supine;10 reps;AROM;Left Long Arc Quad: Left;10 reps;Seated;Strengthening End of Session PT - End of Session Equipment Utilized During Treatment: Gait belt Activity Tolerance: Patient tolerated treatment well Patient left: in chair;with call bell in reach;with family/visitor present General Behavior During Session: [facility] for tasks performed Cognition: [facility] for tasks performed  Herbert Marquez,Herbert Marquez 03/13/2011, 10:00 AM Pager: [phone]

## 2011-03-13 NOTE — Progress Notes (Signed)
Patient discharged to home with [facility], alert and oriented no distress noted upon d/c. Home instructions given to pt and wife. Lovenox inj. Instructions on how to administer given to wife and patient and verbalized understanding.PIV removed no s/s of infiltration , no swelling noted.

## 2011-03-13 NOTE — Discharge Instructions (Signed)
Hip Fracture (Upper Femoral Fracture) You have a hip fracture (break in bone). This is a fracture of the upper part of the big bone (femur, thigh bone) between your hip and knee. If your caregiver feels it is a stable fracture, occasionally it can be treated without surgery. Usually these fractures are unstable. This means that the bones will not heal properly without surgery. Surgery is necessary to hold the bones together in a good position where they will heal well. DIAGNOSIS A physical exam can determine if a fracture has occurred. X-ray studies are needed to see what type of fracture is present and to look for other injuries. These studies will help your caregiver determine what the best treatment is for you. If there is more than one option, your caregiver can give you the information needed to help you decide on the treatment. TREATMENT  The treatment for an unstable fracture is usually surgery. This means using a screw, nail, or rod to hold the bones in place.  RISKS AND COMPLICATIONS All surgery is associated with risks. Sometimes the implant may fail. Other complications of surgery include infection or the bones not healing properly. Sometimes the fracture may damage the blood supply to the head of the femur. That portion of bone may die (osteonecrosis or avascular necrosis). Sometimes to avoid this complication, an implant is used which just replaces the ball of the femur (hemi-arthroplasty or prosthetic replacement). Some of the other risks are:  Excessive bleeding.   Infection.   Dislocation if a hemi-arthroplasty or a total hip was inserted.   Failure to heal properly resulting in an unstable hip.   Stiffness of hip following repair.   On occasion, blood may have to be replaced before or during the procedure  LET YOUR CAREGIVERS KNOW ABOUT:  Allergies.   Medications taken including herbs, eye drops, over the counter medications, and creams.   Use of steroids (by mouth or  creams).   History of bleeding or blood problems.   History of serious infection.   Previous problems with anesthetics or novocaine.   Possibility of pregnancy, if this applies.   History of blood clots (thrombophlebitis).   Previous surgery.   Other health problems.  BEFORE THE PROCEDURE Before surgery, an IV (intravenous line connected to your vein) may be started. You will be given an anesthetic (medications and gas to make you sleep) or given medications in your back to make you numb from the waist down (spinal anesthetic). AFTER THE PROCEDURE After surgery, you will be taken to the recovery area where a nurse will watch your progress. You may have a catheter (a long, narrow, hollow tube) in your bladder that helps you pass your water. Once you're awake, stable, and taking fluids well, you will be returned to your room. You will receive physical therapy and other care until you are doing well and your caregiver feels it is safe for you to be transferred either to home or to an extended care facility. Your activity level will change as your caregiver determines what is best for you.  You may resume normal diet and activities as directed or allowed.   Change dressings if necessary or as directed.   Only take over-the-counter or prescription medicines for pain, discomfort, or fever as directed by your caregiver.   You may be placed on blood thinners for 4-6 weeks to prevent blood clots.  SEEK IMMEDIATE MEDICAL CARE IF:  There is swelling of your calf or leg.   You   have shortness of breath or chest pain.   There is redness, swelling, or increasing pain in the wound.   There is pus coming from wound.   You have an unexplained oral temperature above 102 F (38.9 C).   There is a foul (bad) smell coming from the wound or dressing.   There is a breaking open of the wound (edges not staying together) after sutures or staples have been removed.   There is a marked increase in  pain or shortening of the leg.   You have severe pain anywhere in the leg.   There is any change in color or temperature of your leg below the injury.  MAKE SURE YOU:   Understand these instructions.   Will watch your condition.   Will get help right away if you are not doing well or get worse.  Document Released: 02/19/2005 Document Revised: 11/01/2010 Document Reviewed: 10/10/2007 Exit[employer] Patient Information 2012 [employer]. 

## 2011-03-13 NOTE — Progress Notes (Signed)
   CARE MANAGEMENT NOTE 03/13/2011  Patient:  Herbert Marquez, Herbert Marquez   Account Number:  [id number]  Date Initiated:  03/13/2011  Documentation initiated by:  Raiford Noble  Subjective/Objective Assessment:   adm with neck fx     Action/Plan:   lives with wife   Anticipated DC Date:  03/13/2011   Anticipated DC Plan:  HOME W HOME HEALTH SERVICES      DC Planning Services  CM consult      [facility] Choice  HOME HEALTH  DURABLE MEDICAL EQUIPMENT   Choice offered to / List presented to:  C-1 Patient   DME arranged  3-N-1  Levan Hurst      DME agency  [facility]     [facility] arranged  HH-1 RN  HH-2 PT  HH-3 OT      [facility] agency  [facility]   Status of service:  Completed, signed off Medicare Important Message given?   (If response is "NO", the following Medicare IM given date fields will be blank) Date Medicare IM given:   Date Additional Medicare IM given:    Discharge Disposition:  HOME W HOME HEALTH SERVICES  Per UR Regulation:  Reviewed for med. necessity/level of care/duration of stay  Comments:  03-13-11 Spoke with Herbert Marquez to confirm DC and added [facility] RN. Rush Barer [phone]   03-12-11 Spoke with Herbert Marquez and wife at bedside to discuss dc plans regarding home health. Agreeable to [facility] PT/RN services upon dc. Chose [facility] for [facility] services and rolling walker and 3 in 1. Left vm for Herbert Marquez with [facility] to make aware of referral for PT/OT services. Spoke with Herbert Marquez with [facility]/DME regarding referral for 3 in 1 and rw. PCP: Dr. Shary Decamp. No further needs asssessed.

## 2011-03-14 DIAGNOSIS — E119 Type 2 diabetes mellitus without complications: Secondary | ICD-10-CM | POA: Diagnosis not present

## 2011-03-14 DIAGNOSIS — Z471 Aftercare following joint replacement surgery: Secondary | ICD-10-CM | POA: Diagnosis not present

## 2011-03-14 DIAGNOSIS — E669 Obesity, unspecified: Secondary | ICD-10-CM | POA: Diagnosis not present

## 2011-03-14 DIAGNOSIS — I10 Essential (primary) hypertension: Secondary | ICD-10-CM | POA: Diagnosis not present

## 2011-03-14 DIAGNOSIS — J4489 Other specified chronic obstructive pulmonary disease: Secondary | ICD-10-CM | POA: Diagnosis not present

## 2011-03-14 DIAGNOSIS — I2510 Atherosclerotic heart disease of native coronary artery without angina pectoris: Secondary | ICD-10-CM | POA: Diagnosis not present

## 2011-03-14 DIAGNOSIS — J449 Chronic obstructive pulmonary disease, unspecified: Secondary | ICD-10-CM | POA: Diagnosis not present

## 2011-03-15 DIAGNOSIS — J4489 Other specified chronic obstructive pulmonary disease: Secondary | ICD-10-CM | POA: Diagnosis not present

## 2011-03-15 DIAGNOSIS — E119 Type 2 diabetes mellitus without complications: Secondary | ICD-10-CM | POA: Diagnosis not present

## 2011-03-15 DIAGNOSIS — J449 Chronic obstructive pulmonary disease, unspecified: Secondary | ICD-10-CM | POA: Diagnosis not present

## 2011-03-15 DIAGNOSIS — I2510 Atherosclerotic heart disease of native coronary artery without angina pectoris: Secondary | ICD-10-CM | POA: Diagnosis not present

## 2011-03-15 DIAGNOSIS — E669 Obesity, unspecified: Secondary | ICD-10-CM | POA: Diagnosis not present

## 2011-03-15 DIAGNOSIS — Z471 Aftercare following joint replacement surgery: Secondary | ICD-10-CM | POA: Diagnosis not present

## 2011-03-15 DIAGNOSIS — I10 Essential (primary) hypertension: Secondary | ICD-10-CM | POA: Diagnosis not present

## 2011-03-16 DIAGNOSIS — E669 Obesity, unspecified: Secondary | ICD-10-CM | POA: Diagnosis not present

## 2011-03-16 DIAGNOSIS — I2510 Atherosclerotic heart disease of native coronary artery without angina pectoris: Secondary | ICD-10-CM | POA: Diagnosis not present

## 2011-03-16 DIAGNOSIS — I10 Essential (primary) hypertension: Secondary | ICD-10-CM | POA: Diagnosis not present

## 2011-03-16 DIAGNOSIS — E119 Type 2 diabetes mellitus without complications: Secondary | ICD-10-CM | POA: Diagnosis not present

## 2011-03-16 DIAGNOSIS — Z471 Aftercare following joint replacement surgery: Secondary | ICD-10-CM | POA: Diagnosis not present

## 2011-03-16 DIAGNOSIS — J449 Chronic obstructive pulmonary disease, unspecified: Secondary | ICD-10-CM | POA: Diagnosis not present

## 2011-03-16 DIAGNOSIS — J4489 Other specified chronic obstructive pulmonary disease: Secondary | ICD-10-CM | POA: Diagnosis not present

## 2011-03-19 DIAGNOSIS — J449 Chronic obstructive pulmonary disease, unspecified: Secondary | ICD-10-CM | POA: Diagnosis not present

## 2011-03-19 DIAGNOSIS — J4489 Other specified chronic obstructive pulmonary disease: Secondary | ICD-10-CM | POA: Diagnosis not present

## 2011-03-19 DIAGNOSIS — I10 Essential (primary) hypertension: Secondary | ICD-10-CM | POA: Diagnosis not present

## 2011-03-19 DIAGNOSIS — E669 Obesity, unspecified: Secondary | ICD-10-CM | POA: Diagnosis not present

## 2011-03-19 DIAGNOSIS — Z471 Aftercare following joint replacement surgery: Secondary | ICD-10-CM | POA: Diagnosis not present

## 2011-03-19 DIAGNOSIS — I2510 Atherosclerotic heart disease of native coronary artery without angina pectoris: Secondary | ICD-10-CM | POA: Diagnosis not present

## 2011-03-19 DIAGNOSIS — E119 Type 2 diabetes mellitus without complications: Secondary | ICD-10-CM | POA: Diagnosis not present

## 2011-03-22 DIAGNOSIS — E119 Type 2 diabetes mellitus without complications: Secondary | ICD-10-CM | POA: Diagnosis not present

## 2011-03-22 DIAGNOSIS — J4489 Other specified chronic obstructive pulmonary disease: Secondary | ICD-10-CM | POA: Diagnosis not present

## 2011-03-22 DIAGNOSIS — J449 Chronic obstructive pulmonary disease, unspecified: Secondary | ICD-10-CM | POA: Diagnosis not present

## 2011-03-22 DIAGNOSIS — I2510 Atherosclerotic heart disease of native coronary artery without angina pectoris: Secondary | ICD-10-CM | POA: Diagnosis not present

## 2011-03-22 DIAGNOSIS — Z471 Aftercare following joint replacement surgery: Secondary | ICD-10-CM | POA: Diagnosis not present

## 2011-03-22 DIAGNOSIS — I10 Essential (primary) hypertension: Secondary | ICD-10-CM | POA: Diagnosis not present

## 2011-03-22 DIAGNOSIS — E669 Obesity, unspecified: Secondary | ICD-10-CM | POA: Diagnosis not present

## 2011-03-26 DIAGNOSIS — E119 Type 2 diabetes mellitus without complications: Secondary | ICD-10-CM | POA: Diagnosis not present

## 2011-03-26 DIAGNOSIS — I10 Essential (primary) hypertension: Secondary | ICD-10-CM | POA: Diagnosis not present

## 2011-03-26 DIAGNOSIS — J449 Chronic obstructive pulmonary disease, unspecified: Secondary | ICD-10-CM | POA: Diagnosis not present

## 2011-03-26 DIAGNOSIS — J4489 Other specified chronic obstructive pulmonary disease: Secondary | ICD-10-CM | POA: Diagnosis not present

## 2011-03-26 DIAGNOSIS — Z471 Aftercare following joint replacement surgery: Secondary | ICD-10-CM | POA: Diagnosis not present

## 2011-03-26 DIAGNOSIS — I2510 Atherosclerotic heart disease of native coronary artery without angina pectoris: Secondary | ICD-10-CM | POA: Diagnosis not present

## 2011-03-26 DIAGNOSIS — E669 Obesity, unspecified: Secondary | ICD-10-CM | POA: Diagnosis not present

## 2011-03-29 DIAGNOSIS — J449 Chronic obstructive pulmonary disease, unspecified: Secondary | ICD-10-CM | POA: Diagnosis not present

## 2011-03-29 DIAGNOSIS — I10 Essential (primary) hypertension: Secondary | ICD-10-CM | POA: Diagnosis not present

## 2011-03-29 DIAGNOSIS — Z471 Aftercare following joint replacement surgery: Secondary | ICD-10-CM | POA: Diagnosis not present

## 2011-03-29 DIAGNOSIS — J4489 Other specified chronic obstructive pulmonary disease: Secondary | ICD-10-CM | POA: Diagnosis not present

## 2011-03-29 DIAGNOSIS — E119 Type 2 diabetes mellitus without complications: Secondary | ICD-10-CM | POA: Diagnosis not present

## 2011-03-29 DIAGNOSIS — I2510 Atherosclerotic heart disease of native coronary artery without angina pectoris: Secondary | ICD-10-CM | POA: Diagnosis not present

## 2011-03-29 DIAGNOSIS — E669 Obesity, unspecified: Secondary | ICD-10-CM | POA: Diagnosis not present

## 2011-04-02 ENCOUNTER — Telehealth: Payer: Self-pay | Admitting: Cardiovascular Disease

## 2011-04-02 NOTE — Telephone Encounter (Signed)
New Msg: Pt wife calling wanting to know if pt needs to keep appt with MD on 04/12/11. Pt was just seen in the hospital 03/09/11 at which time pt wife said all tests were run. Please return pt wife call to discuss further.

## 2011-04-02 NOTE — Telephone Encounter (Signed)
Advised pt to keep appt.

## 2011-04-03 DIAGNOSIS — E119 Type 2 diabetes mellitus without complications: Secondary | ICD-10-CM | POA: Diagnosis not present

## 2011-04-03 DIAGNOSIS — J4489 Other specified chronic obstructive pulmonary disease: Secondary | ICD-10-CM | POA: Diagnosis not present

## 2011-04-03 DIAGNOSIS — E669 Obesity, unspecified: Secondary | ICD-10-CM | POA: Diagnosis not present

## 2011-04-03 DIAGNOSIS — I2510 Atherosclerotic heart disease of native coronary artery without angina pectoris: Secondary | ICD-10-CM | POA: Diagnosis not present

## 2011-04-03 DIAGNOSIS — J449 Chronic obstructive pulmonary disease, unspecified: Secondary | ICD-10-CM | POA: Diagnosis not present

## 2011-04-03 DIAGNOSIS — I10 Essential (primary) hypertension: Secondary | ICD-10-CM | POA: Diagnosis not present

## 2011-04-03 DIAGNOSIS — Z471 Aftercare following joint replacement surgery: Secondary | ICD-10-CM | POA: Diagnosis not present

## 2011-04-12 ENCOUNTER — Encounter: Payer: Self-pay | Admitting: Cardiovascular Disease

## 2011-04-12 ENCOUNTER — Ambulatory Visit (INDEPENDENT_AMBULATORY_CARE_PROVIDER_SITE_OTHER): Payer: Medicare Other | Admitting: Cardiovascular Disease

## 2011-04-12 VITALS — BP 129/74 | HR 65 | Ht 66.0 in | Wt 195.0 lb

## 2011-04-12 DIAGNOSIS — I2510 Atherosclerotic heart disease of native coronary artery without angina pectoris: Secondary | ICD-10-CM | POA: Diagnosis not present

## 2011-04-12 DIAGNOSIS — E785 Hyperlipidemia, unspecified: Secondary | ICD-10-CM | POA: Diagnosis not present

## 2011-04-12 LAB — BASIC METABOLIC PANEL
BUN: 19 mg/dL (ref 6–23)
CO2: 26 mEq/L (ref 19–32)
Calcium: 9.4 mg/dL (ref 8.4–10.5)
Chloride: 102 mEq/L (ref 96–112)
Creatinine, Ser: 1.3 mg/dL (ref 0.4–1.5)
GFR: 54.76 mL/min — ABNORMAL LOW (ref >60.00)
Glucose, Bld: 101 mg/dL — ABNORMAL HIGH (ref 70–99)
Potassium: 4 mEq/L (ref 3.5–5.1)
Sodium: 136 mEq/L (ref 135–145)

## 2011-04-12 LAB — LIPID PANEL
Cholesterol: 124 mg/dL (ref 0–200)
HDL: 41.3 mg/dL (ref >39.00)
LDL Cholesterol: 68 mg/dL (ref 0–99)
Total CHOL/HDL Ratio: 3
Triglycerides: 74 mg/dL (ref 0.0–149.0)
VLDL: 14.8 mg/dL (ref 0.0–40.0)

## 2011-04-12 LAB — HEPATIC FUNCTION PANEL
ALT: 12 U/L (ref 0–53)
AST: 24 U/L (ref 0–37)
Albumin: 3.8 g/dL (ref 3.5–5.2)
Alkaline Phosphatase: 94 U/L (ref 39–117)
Bilirubin, Direct: 0.1 mg/dL (ref 0.0–0.3)
Total Bilirubin: 0.7 mg/dL (ref 0.3–1.2)
Total Protein: 6.8 g/dL (ref 6.0–8.3)

## 2011-04-12 NOTE — Assessment & Plan Note (Signed)
He is doing well from a cardiac standpoint.  He had no cardiac complications during surgery. We'll check fasting labs today. I'll see him again in 6 months for followup visit.

## 2011-04-12 NOTE — Progress Notes (Signed)
Jerrell Belfast Date of Birth  Dec 21, 1927 [facility]     [place] Office  [address]    [address]   [address] [place], [place]  [address]    [place], [place]  [address] [phone]  Fax  [phone]  [phone]  Fax [phone]  Problem list: 1. Coronary artery disease-status post PTCA and stenting of the RCA in [place], [place] 2. Dyslipidemia 3. Hypertension 4. Diabetes mellitus 5. Hernia repair   History of Present Illness:  Pt has done well from a cardiac standpoint.  He fell 5 weeks ago and broke his left hip.  His is recovering well.  He is still walking with a cane.  He had no complications during or after surgery.    Current Outpatient Prescriptions on File Prior to Visit  Medication Sig Dispense Refill  . aspirin 81 MG tablet Take 81 mg by mouth daily.        [name] atorvastatin (LIPITOR) 40 MG tablet Take 40 mg by mouth daily.       . diphenhydrAMINE (BENADRYL) 25 MG tablet Take 25 mg by mouth every 6 (six) hours as needed.        . doxazosin (CARDURA) 4 MG tablet Take 8 mg by mouth daily.        . metoprolol (TOPROL-XL) 100 MG 24 hr tablet Take 50 mg by mouth daily.       . Multiple Vitamin (MULTIVITAMIN) tablet Take 1 tablet by mouth daily.        . nitroGLYCERIN (NITROSTAT) 0.4 MG SL tablet Place 1 tablet (0.4 mg total) under the tongue every 5 (five) minutes as needed.  25 tablet  12  . omeprazole (PRILOSEC) 20 MG capsule Take 20 mg by mouth daily.        . pioglitazone (ACTOS) 30 MG tablet Take 30 mg by mouth daily.        . ramipril (ALTACE) 2.5 MG capsule Take 2.5 mg by mouth daily.          No Known Allergies  Past Medical History  Diagnosis Date  . Coronary artery disease   . Hyperlipidemia   . Hypertension   . Diabetes mellitus   . BPH (benign prostatic hypertrophy)   . TIA (transient ischemic attack)     Approximately 6 weeks post-cardiac catheterization.     Past Surgical History  Procedure Date  .  Coronary angioplasty with stent placement 10/2004    stenting x 2 to RCA  . Hernia repair   . Cataract extraction, bilateral   . Cardiovascular stress test 07/01/2007    EF 74%  . Hip arthroplasty 03/09/2011    Procedure: ARTHROPLASTY BIPOLAR HIP;  Surgeon: Shelda Pal;  Location: [facility];  Service: Orthopedics;  Laterality: Left;    History  Smoking status  . Former Smoker  . Quit date: 03/06/1971  Smokeless tobacco  . Never Used    History  Alcohol Use  . 6.0 - 8.4 oz/week  . 10-14 Glasses of wine per week    Family History  Problem Relation Age of Onset  . Lung cancer Father   . Lung cancer Brother   . Hypertension Mother     Reviw of Systems:  Reviewed in the HPI.  All other systems are negative.  Physical Exam: Blood pressure 129/74, pulse 65, height 5\' 6"  (1.676 m), weight 195 lb (88.451 kg). General: Well developed, well nourished, in no acute distress.  Head: Normocephalic, atraumatic, sclera  non-icteric, mucus membranes are moist,   Neck: Supple. Negative for carotid bruits. JVD not elevated.  Lungs: Clear bilaterally to auscultation without wheezes, rales, or rhonchi. Breathing is unlabored.  Heart: RRR with S1 S2. No murmurs, rubs, or gallops appreciated.  Abdomen: Soft, non-tender, non-distended with normoactive bowel sounds. No hepatomegaly. No rebound/guarding. No obvious abdominal masses.  Msk:  Strength and tone appear normal for age.  Extremities: No clubbing or cyanosis. No edema.  Distal pedal pulses are 2+ and equal bilaterally.  Neuro: Alert and oriented X 3. Moves all extremities spontaneously.  Psych:  Responds to questions appropriately with a normal affect.  ECG:  Assessment / Plan:

## 2011-04-12 NOTE — Patient Instructions (Signed)
Your physician wants you to follow-up in: 6 months  You will receive a reminder letter in the mail two months in advance. If you don't receive a letter, please call our office to schedule the follow-up appointment.   Your physician recommends that you return for a FASTING lipid profile: today  

## 2011-04-13 ENCOUNTER — Other Ambulatory Visit: Payer: Self-pay | Admitting: Cardiovascular Disease

## 2011-04-19 DIAGNOSIS — S72033A Displaced midcervical fracture of unspecified femur, initial encounter for closed fracture: Secondary | ICD-10-CM | POA: Diagnosis not present

## 2011-05-11 DIAGNOSIS — E1129 Type 2 diabetes mellitus with other diabetic kidney complication: Secondary | ICD-10-CM | POA: Diagnosis not present

## 2011-05-18 DIAGNOSIS — E1129 Type 2 diabetes mellitus with other diabetic kidney complication: Secondary | ICD-10-CM | POA: Diagnosis not present

## 2011-05-18 DIAGNOSIS — I2510 Atherosclerotic heart disease of native coronary artery without angina pectoris: Secondary | ICD-10-CM | POA: Diagnosis not present

## 2011-05-18 DIAGNOSIS — N183 Chronic kidney disease, stage 3 unspecified: Secondary | ICD-10-CM | POA: Diagnosis not present

## 2011-05-18 DIAGNOSIS — I10 Essential (primary) hypertension: Secondary | ICD-10-CM | POA: Diagnosis not present

## 2011-05-18 DIAGNOSIS — E785 Hyperlipidemia, unspecified: Secondary | ICD-10-CM | POA: Diagnosis not present

## 2011-05-31 DIAGNOSIS — IMO0002 Reserved for concepts with insufficient information to code with codable children: Secondary | ICD-10-CM | POA: Diagnosis not present

## 2011-05-31 DIAGNOSIS — M1710 Unilateral primary osteoarthritis, unspecified knee: Secondary | ICD-10-CM | POA: Diagnosis not present

## 2011-06-21 ENCOUNTER — Other Ambulatory Visit: Payer: Self-pay | Admitting: Cardiovascular Disease

## 2011-06-21 NOTE — Telephone Encounter (Signed)
Refilled generic maxzide

## 2011-06-28 DIAGNOSIS — L909 Atrophic disorder of skin, unspecified: Secondary | ICD-10-CM | POA: Diagnosis not present

## 2011-06-28 DIAGNOSIS — L919 Hypertrophic disorder of the skin, unspecified: Secondary | ICD-10-CM | POA: Diagnosis not present

## 2011-06-28 DIAGNOSIS — L821 Other seborrheic keratosis: Secondary | ICD-10-CM | POA: Diagnosis not present

## 2011-06-28 DIAGNOSIS — L570 Actinic keratosis: Secondary | ICD-10-CM | POA: Diagnosis not present

## 2011-08-31 ENCOUNTER — Encounter (HOSPITAL_COMMUNITY): Payer: Self-pay | Admitting: Family Medicine

## 2011-08-31 ENCOUNTER — Telehealth: Payer: Self-pay | Admitting: Cardiovascular Disease

## 2011-08-31 ENCOUNTER — Inpatient Hospital Stay (HOSPITAL_COMMUNITY)
Admission: EM | Admit: 2011-08-31 | Discharge: 2011-09-02 | DRG: 690 | Disposition: A | Discharge status: Home / Self Care | Payer: Medicare Other | Attending: Family Medicine | Admitting: Family Medicine

## 2011-08-31 DIAGNOSIS — I252 Old myocardial infarction: Secondary | ICD-10-CM | POA: Diagnosis not present

## 2011-08-31 DIAGNOSIS — N179 Acute kidney failure, unspecified: Secondary | ICD-10-CM | POA: Diagnosis present

## 2011-08-31 DIAGNOSIS — E119 Type 2 diabetes mellitus without complications: Secondary | ICD-10-CM | POA: Diagnosis present

## 2011-08-31 DIAGNOSIS — N390 Urinary tract infection, site not specified: Principal | ICD-10-CM | POA: Diagnosis present

## 2011-08-31 DIAGNOSIS — E7800 Pure hypercholesterolemia, unspecified: Secondary | ICD-10-CM | POA: Diagnosis not present

## 2011-08-31 DIAGNOSIS — D649 Anemia, unspecified: Secondary | ICD-10-CM | POA: Diagnosis present

## 2011-08-31 DIAGNOSIS — J4489 Other specified chronic obstructive pulmonary disease: Secondary | ICD-10-CM | POA: Diagnosis present

## 2011-08-31 DIAGNOSIS — R350 Frequency of micturition: Secondary | ICD-10-CM | POA: Diagnosis not present

## 2011-08-31 DIAGNOSIS — J449 Chronic obstructive pulmonary disease, unspecified: Secondary | ICD-10-CM | POA: Diagnosis present

## 2011-08-31 DIAGNOSIS — E782 Mixed hyperlipidemia: Secondary | ICD-10-CM | POA: Diagnosis not present

## 2011-08-31 DIAGNOSIS — A419 Sepsis, unspecified organism: Secondary | ICD-10-CM

## 2011-08-31 DIAGNOSIS — E1165 Type 2 diabetes mellitus with hyperglycemia: Secondary | ICD-10-CM

## 2011-08-31 DIAGNOSIS — I10 Essential (primary) hypertension: Secondary | ICD-10-CM | POA: Diagnosis present

## 2011-08-31 DIAGNOSIS — D7289 Other specified disorders of white blood cells: Secondary | ICD-10-CM | POA: Diagnosis not present

## 2011-08-31 DIAGNOSIS — Z9861 Coronary angioplasty status: Secondary | ICD-10-CM | POA: Diagnosis not present

## 2011-08-31 DIAGNOSIS — I2510 Atherosclerotic heart disease of native coronary artery without angina pectoris: Secondary | ICD-10-CM | POA: Diagnosis present

## 2011-08-31 DIAGNOSIS — IMO0001 Reserved for inherently not codable concepts without codable children: Secondary | ICD-10-CM | POA: Diagnosis not present

## 2011-08-31 LAB — CBC WITH DIFFERENTIAL/PLATELET
Basophils Absolute: 0 10*3/uL (ref 0.0–0.1)
Basophils Relative: 0 % (ref 0–1)
Eosinophils Absolute: 0 10*3/uL (ref 0.0–0.7)
Eosinophils Relative: 0 % (ref 0–5)
HCT: 32.4 % — ABNORMAL LOW (ref 39.0–52.0)
Hemoglobin: 11 g/dL — ABNORMAL LOW (ref 13.0–17.0)
Lymphocytes Relative: 3 % — ABNORMAL LOW (ref 12–46)
Lymphs Abs: 0.6 10*3/uL — ABNORMAL LOW (ref 0.7–4.0)
MCH: 31.3 pg (ref 26.0–34.0)
MCHC: 34 g/dL (ref 30.0–36.0)
MCV: 92.3 fL (ref 78.0–100.0)
Monocytes Absolute: 1.5 10*3/uL — ABNORMAL HIGH (ref 0.1–1.0)
Monocytes Relative: 7 % (ref 3–12)
Neutro Abs: 18.9 10*3/uL — ABNORMAL HIGH (ref 1.7–7.7)
Neutrophils Relative %: 90 % — ABNORMAL HIGH (ref 43–77)
Platelets: 326 10*3/uL (ref 150–400)
RBC: 3.51 MIL/uL — ABNORMAL LOW (ref 4.22–5.81)
RDW: 14.7 % (ref 11.5–15.5)
WBC: 21 10*3/uL — ABNORMAL HIGH (ref 4.0–10.5)

## 2011-08-31 LAB — LACTIC ACID, PLASMA: Lactic Acid, Venous: 1.3 mmol/L (ref 0.5–2.2)

## 2011-08-31 LAB — URINALYSIS, ROUTINE W REFLEX MICROSCOPIC
Glucose, UA: NEGATIVE mg/dL
Ketones, ur: 15 mg/dL — AB
Nitrite: NEGATIVE
Protein, ur: 30 mg/dL — AB
Specific Gravity, Urine: 1.026 (ref 1.005–1.030)
Urobilinogen, UA: 0.2 mg/dL (ref 0.0–1.0)
pH: 6 (ref 5.0–8.0)

## 2011-08-31 LAB — URINE MICROSCOPIC-ADD ON

## 2011-08-31 LAB — POCT I-STAT, CHEM 8
BUN: 23 mg/dL (ref 6–23)
Calcium, Ion: 1.2 mmol/L (ref 1.12–1.32)
Chloride: 99 mEq/L (ref 96–112)
Creatinine, Ser: 1.7 mg/dL — ABNORMAL HIGH (ref 0.50–1.35)
Glucose, Bld: 134 mg/dL — ABNORMAL HIGH (ref 70–99)
HCT: 36 % — ABNORMAL LOW (ref 39.0–52.0)
Hemoglobin: 12.2 g/dL — ABNORMAL LOW (ref 13.0–17.0)
Potassium: 3.6 mEq/L (ref 3.5–5.1)
Sodium: 133 mEq/L — ABNORMAL LOW (ref 135–145)
TCO2: 21 mmol/L (ref 0–100)

## 2011-08-31 LAB — GLUCOSE, CAPILLARY
Glucose-Capillary: 121 mg/dL — ABNORMAL HIGH (ref 70–99)
Glucose-Capillary: 118 mg/dL — ABNORMAL HIGH (ref 70–99)

## 2011-08-31 MED ORDER — ONDANSETRON HCL 4 MG PO TABS: 4.0000 mg | ORAL_TABLET | Freq: Four times a day (QID) | ORAL | Status: DC | PRN

## 2011-08-31 MED ORDER — PIOGLITAZONE HCL 30 MG PO TABS
30.0000 mg | ORAL_TABLET | Freq: Every day | ORAL | Status: DC
Start: 2011-08-31 — End: 2011-09-02
  Administered 2011-08-31 – 2011-09-02 (×3): 30 mg via ORAL
  Filled 2011-08-31 (×3): qty 1

## 2011-08-31 MED ORDER — DEXTROSE 5 % IV SOLN
1.0000 g | INTRAVENOUS | Status: DC
  Administered 2011-09-01: 1 g via INTRAVENOUS
  Filled 2011-08-31 (×2): qty 10

## 2011-08-31 MED ORDER — HEPARIN SODIUM (PORCINE) 5000 UNIT/ML IJ SOLN
5000.0000 [IU] | Freq: Three times a day (TID) | INTRAMUSCULAR | Status: DC
  Administered 2011-08-31 – 2011-09-02 (×5): 5000 [IU] via SUBCUTANEOUS
  Filled 2011-08-31 (×8): qty 1

## 2011-08-31 MED ORDER — PANTOPRAZOLE SODIUM 40 MG PO TBEC
40.0000 mg | DELAYED_RELEASE_TABLET | Freq: Every day | ORAL | Status: DC
  Administered 2011-09-01: 40 mg via ORAL
  Filled 2011-08-31 (×2): qty 1

## 2011-08-31 MED ORDER — ONDANSETRON HCL 4 MG/2ML IJ SOLN: 4.0000 mg | Freq: Four times a day (QID) | INTRAMUSCULAR | Status: DC | PRN

## 2011-08-31 MED ORDER — DEXTROSE 5 % IV SOLN
1.0000 g | Freq: Once | INTRAVENOUS | Status: AC
  Administered 2011-08-31: 1 g via INTRAVENOUS
  Filled 2011-08-31: qty 10

## 2011-08-31 MED ORDER — ASPIRIN 81 MG PO CHEW
81.0000 mg | CHEWABLE_TABLET | Freq: Every day | ORAL | Status: DC
  Administered 2011-09-01 – 2011-09-02 (×2): 81 mg via ORAL
  Filled 2011-08-31 (×2): qty 1

## 2011-08-31 MED ORDER — ACETAMINOPHEN 325 MG PO TABS: 650.0000 mg | ORAL_TABLET | Freq: Four times a day (QID) | ORAL | Status: DC | PRN

## 2011-08-31 MED ORDER — ASPIRIN 81 MG PO TABS: 81.0000 mg | ORAL_TABLET | Freq: Every day | ORAL | Status: DC

## 2011-08-31 MED ORDER — INSULIN ASPART 100 UNIT/ML ~~LOC~~ SOLN: 0.0000 [IU] | Freq: Three times a day (TID) | SUBCUTANEOUS | Status: DC

## 2011-08-31 MED ORDER — ATORVASTATIN CALCIUM 40 MG PO TABS
40.0000 mg | ORAL_TABLET | Freq: Every day | ORAL | Status: DC
  Administered 2011-08-31 – 2011-09-01 (×2): 40 mg via ORAL
  Filled 2011-08-31 (×3): qty 1

## 2011-08-31 MED ORDER — SODIUM CHLORIDE 0.9 % IV SOLN
1000.0000 mL | INTRAVENOUS | Status: DC
  Administered 2011-08-31: 1000 mL via INTRAVENOUS

## 2011-08-31 MED ORDER — DOCUSATE SODIUM 100 MG PO CAPS
100.0000 mg | ORAL_CAPSULE | Freq: Every day | ORAL | Status: DC | PRN
  Filled 2011-08-31: qty 1

## 2011-08-31 MED ORDER — SODIUM CHLORIDE 0.9 % IV SOLN
INTRAVENOUS | Status: DC
  Administered 2011-09-01 – 2011-09-02 (×3): via INTRAVENOUS

## 2011-08-31 MED ORDER — ACETAMINOPHEN 650 MG RE SUPP: 650.0000 mg | Freq: Four times a day (QID) | RECTAL | Status: DC | PRN

## 2011-08-31 MED ORDER — DOXAZOSIN MESYLATE 8 MG PO TABS
8.0000 mg | ORAL_TABLET | Freq: Every day | ORAL | Status: DC
Start: 2011-08-31 — End: 2011-09-02
  Administered 2011-08-31 – 2011-09-01 (×2): 8 mg via ORAL
  Filled 2011-08-31 (×3): qty 1

## 2011-08-31 MED ORDER — NITROGLYCERIN 0.4 MG SL SUBL: 0.4000 mg | SUBLINGUAL_TABLET | SUBLINGUAL | Status: DC | PRN

## 2011-08-31 MED ORDER — SODIUM CHLORIDE 0.9 % IV BOLUS (SEPSIS)
1000.0000 mL | Freq: Once | INTRAVENOUS | Status: AC
  Administered 2011-08-31: 1000 mL via INTRAVENOUS

## 2011-08-31 MED ORDER — ALBUTEROL SULFATE (5 MG/ML) 0.5% IN NEBU: 2.5000 mg | INHALATION_SOLUTION | Freq: Four times a day (QID) | RESPIRATORY_TRACT | Status: DC | PRN

## 2011-08-31 NOTE — Telephone Encounter (Signed)
Wife reports that Herbert Marquez is Feverish, blank stare, not speaking total sense. They have an app with pcp in 1.5 hrs, family said he was up 25 times to urinate last night and mostly unable to void. They are unsure they will be able to dress him. I told them to call 911 and go into ER, wife agreed to plan.

## 2011-08-31 NOTE — Telephone Encounter (Signed)
Number busy

## 2011-08-31 NOTE — ED Provider Notes (Signed)
History     CSN: [id number]  Arrival date & time 08/31/11  1002   First MD Initiated Contact with Patient 08/31/11 1007      Chief Complaint  Patient presents with  . Urinary Frequency    (Consider location/radiation/quality/duration/timing/severity/associated sxs/prior treatment) HPI Complains of urinary frequency urinating every 15 minutes and burning on urination at urethral meatus onset approximately 24 hours ago. Wife reports that he seemed confused yesterday and had subjective fever, feeling warm to touch. He treated himself with cystec, an over-the-counter medication for dysuria. Today patient is asymptomatic. He denies pain anywhere he denies lightheadedness on standing. He's had no nausea or vomiting. No other associated symptoms. Past Medical History  Diagnosis Date  . Coronary artery disease   . Hyperlipidemia   . Hypertension   . Diabetes mellitus   . BPH (benign prostatic hypertrophy)   . TIA (transient ischemic attack)     Approximately 6 weeks post-cardiac catheterization.   . MI (myocardial infarction)     Past Surgical History  Procedure Date  . Coronary angioplasty with stent placement 10/2004    stenting x 2 to RCA  . Hernia repair   . Cataract extraction, bilateral   . Cardiovascular stress test 07/01/2007    EF 74%  . Hip arthroplasty 03/09/2011    Procedure: ARTHROPLASTY BIPOLAR HIP;  Surgeon: Shelda Pal;  Location: [facility];  Service: Orthopedics;  Laterality: Left;    Family History  Problem Relation Age of Onset  . Lung cancer Father   . Lung cancer Brother   . Hypertension Mother     History  Substance Use Topics  . Smoking status: Former Smoker    Quit date: 03/06/1971  . Smokeless tobacco: Never Used  . Alcohol Use: 6.0 - 8.4 oz/week    10-14 Glasses of wine per week      Review of Systems  Constitutional: Positive for fever.  HENT: Negative.   Respiratory: Negative.   Cardiovascular: Negative.   Gastrointestinal: Negative.     Genitourinary: Positive for frequency.  Musculoskeletal: Negative.   Skin: Negative.   Neurological: Negative.   Hematological: Negative.   Psychiatric/Behavioral: Positive for confusion.  All other systems reviewed and are negative.    Allergies  Review of patient's allergies indicates no known allergies.  Home Medications   Current Outpatient Rx  Name Route Sig Dispense Refill  . ASPIRIN 81 MG PO TABS Oral Take 81 mg by mouth daily.      . ATORVASTATIN CALCIUM 40 MG PO TABS Oral Take 40 mg by mouth daily.     [name] DIPHENHYDRAMINE HCL 25 MG PO TABS Oral Take 25 mg by mouth every 6 (six) hours as needed.      [name] DOXAZOSIN MESYLATE 4 MG PO TABS Oral Take 8 mg by mouth daily.      [name] METOPROLOL SUCCINATE ER 100 MG PO TB24  TAKE ONE BY MOUTH EVERY DAY 90 tablet 3  . ONE-DAILY MULTI VITAMINS PO TABS Oral Take 1 tablet by mouth daily.      [name] NITROGLYCERIN 0.4 MG SL SUBL Sublingual Place 1 tablet (0.4 mg total) under the tongue every 5 (five) minutes as needed. 25 tablet 12  . OMEPRAZOLE 20 MG PO CPDR Oral Take 20 mg by mouth daily.      [name] PIOGLITAZONE HCL 30 MG PO TABS Oral Take 30 mg by mouth daily.      [name] RAMIPRIL 2.5 MG PO CAPS Oral Take 2.5 mg by mouth daily.      [name]  TRIAMTERENE-HCTZ 75-50 MG PO TABS      . TRIAMTERENE-HCTZ 75-50 MG PO TABS  TAKE ONE-HALF TABLET BY MOUTH EVERY DAY 15 tablet 11    BP 90/54  Pulse 79  Temp 97.8 F (36.6 C) (Oral)  Resp 18  SpO2 95%  Physical Exam  Nursing note and vitals reviewed. Constitutional: He appears well-developed and well-nourished.  HENT:  Head: Normocephalic and atraumatic.  Eyes: Conjunctivae are normal. Pupils are equal, round, and reactive to light.  Neck: Neck supple. No tracheal deviation present. No thyromegaly present.  Cardiovascular: Normal rate and regular rhythm.   No murmur heard. Pulmonary/Chest: Effort normal and breath sounds normal.  Abdominal: Soft. Bowel sounds are normal. He exhibits no distension. There is no  tenderness.  Genitourinary: Penis normal.       uncircumcized  Musculoskeletal: Normal range of motion. He exhibits no edema and no tenderness.  Neurological: He is alert. Coordination normal.  Skin: Skin is warm and dry. No rash noted.  Psychiatric: He has a normal mood and affect.    ED Course  Procedures (including critical care time) Code sepsis called, spoke with [facility], suggests patient can go to internal medicine service given clinical status, not acutely ill appearing presently and lactate  Labs Reviewed  URINALYSIS, ROUTINE W REFLEX MICROSCOPIC   Results for orders placed during the hospital encounter of 08/31/11  URINALYSIS, ROUTINE W REFLEX MICROSCOPIC      Component Value Range   Color, Urine AMBER (*) YELLOW   APPearance CLOUDY (*) CLEAR   Specific Gravity, Urine 1.026  1.005 - 1.030   pH 6.0  5.0 - 8.0   Glucose, UA NEGATIVE  NEGATIVE mg/dL   Hgb urine dipstick MODERATE (*) NEGATIVE   Bilirubin Urine SMALL (*) NEGATIVE   Ketones, ur 15 (*) NEGATIVE mg/dL   Protein, ur 30 (*) NEGATIVE mg/dL   Urobilinogen, UA 0.2  0.0 - 1.0 mg/dL   Nitrite NEGATIVE  NEGATIVE   Leukocytes, UA LARGE (*) NEGATIVE  CBC WITH DIFFERENTIAL      Component Value Range   WBC 21.0 (*) 4.0 - 10.5 K/uL   RBC 3.51 (*) 4.22 - 5.81 MIL/uL   Hemoglobin 11.0 (*) 13.0 - 17.0 g/dL   HCT 11.9 (*) 14.7 - 82.9 %   MCV 92.3  78.0 - 100.0 fL   MCH 31.3  26.0 - 34.0 pg   MCHC 34.0  30.0 - 36.0 g/dL   RDW 56.2  13.0 - 86.5 %   Platelets 326  150 - 400 K/uL   Neutrophils Relative 90 (*) 43 - 77 %   Lymphocytes Relative 3 (*) 12 - 46 %   Monocytes Relative 7  3 - 12 %   Eosinophils Relative 0  0 - 5 %   Basophils Relative 0  0 - 1 %   Neutro Abs 18.9 (*) 1.7 - 7.7 K/uL   Lymphs Abs 0.6 (*) 0.7 - 4.0 K/uL   Monocytes Absolute 1.5 (*) 0.1 - 1.0 K/uL   Eosinophils Absolute 0.0  0.0 - 0.7 K/uL   Basophils Absolute 0.0  0.0 - 0.1 K/uL   WBC Morphology MILD LEFT SHIFT (1-5% METAS, OCC MYELO, OCC  BANDS)     Smear Review LARGE PLATELETS PRESENT    URINE MICROSCOPIC-ADD ON      Component Value Range   Squamous Epithelial / LPF FEW (*) RARE   WBC, UA TOO NUMEROUS TO COUNT  <3 WBC/hpf   Bacteria, UA FEW (*) RARE  Urine-Other MUCOUS PRESENT    POCT I-STAT, CHEM 8      Component Value Range   Sodium 133 (*) 135 - 145 mEq/L   Potassium 3.6  3.5 - 5.1 mEq/L   Chloride 99  96 - 112 mEq/L   BUN 23  6 - 23 mg/dL   Creatinine, Ser 8.46 (*) 0.50 - 1.35 mg/dL   Glucose, Bld 962 (*) 70 - 99 mg/dL   Calcium, Ion 9.52  1.12 - 1.32 mmol/L   TCO2 21  0 - 100 mmol/L   Hemoglobin 12.2 (*) 13.0 - 17.0 g/dL   HCT 84.1 (*) 32.4 - 40.1 %  LACTIC ACID, PLASMA      Component Value Range   Lactic Acid, Venous 1.3  0.5 - 2.2 mmol/L   No results found.  No results found.   No diagnosis found.  Spoke with Dr.Vega  MDM  Plan admit med surge floor Intravenous fluids intravenous antibiotics, Diagnosis #1 sepsis #2 urinary tract infection #3 renal insufficiency #4 hypotension        Doug Sou, MD 08/31/11 1345

## 2011-08-31 NOTE — ED Notes (Signed)
Pt reports urinary frequency starting yesterday.

## 2011-08-31 NOTE — ED Notes (Signed)
5E called x1 for report

## 2011-08-31 NOTE — ED Notes (Signed)
Discussed sepsis protocol with Dr. Ethelda Chick and only rocephin 1 gram ordered. Nio further antibiotics to be ordered at this time.

## 2011-08-31 NOTE — Telephone Encounter (Signed)
New msg Pt's wife called and said he has uti. She said he has been weak. Please call

## 2011-08-31 NOTE — H&P (Signed)
PCP:   Thayer Headings, MD   Chief Complaint:  Frequency, Urgency, Dysuria  HPI: Patient is an 78 y/o CM that presents to the hospital with the above complaints x 1 day.  Subsequently decided to come to the ED for further evaluation.  Patient denies taking any recent antibiotics.  But does endorse some fevers and chills.  Denies any flank pain, nausea, emesis, abdominal discomfort.  Nothing he has tried makes the dysuria/urgency/ or frequency better.  Wife reports that patient seemed confused last night.  But today he is not confused per family and is at baseline.  Initially while in the ED patient had lower blood pressure of 90/54 which responded well to IVF's and last blood pressure reading was 119/97.  Initial lab work showed a WBC of 21, h/h 12.2/36, creatinine of 1.7 (baseline 1.0-1.3), U/A which showed cloudy urine, large leukocytes, too numerous to count WBC, few bacteria, negative nitrite, with moderate hgb urine dipstick. Lactic acid at 1.3.  While in the Ed patient was given Rocephin and 2 L NS Fluid bolus  Allergies:  No Known Allergies    Past Medical History  Diagnosis Date  . Coronary artery disease   . Hyperlipidemia   . Hypertension   . Diabetes mellitus   . BPH (benign prostatic hypertrophy)   . TIA (transient ischemic attack)     Approximately 6 weeks post-cardiac catheterization.   . MI (myocardial infarction)     Past Surgical History  Procedure Date  . Coronary angioplasty with stent placement 10/2004    stenting x 2 to RCA  . Hernia repair   . Cataract extraction, bilateral   . Cardiovascular stress test 07/01/2007    EF 74%  . Hip arthroplasty 03/09/2011    Procedure: ARTHROPLASTY BIPOLAR HIP;  Surgeon: Shelda Pal;  Location: [facility];  Service: Orthopedics;  Laterality: Left;    Prior to Admission medications   Medication Sig Start Date End Date Taking? Authorizing Provider  aspirin 81 MG tablet Take 81 mg by mouth daily.    Yes Historical Provider,  MD  atorvastatin (LIPITOR) 40 MG tablet Take 40 mg by mouth daily.    Yes Historical Provider, MD  diphenhydrAMINE (BENADRYL) 25 MG tablet Take 25 mg by mouth every 6 (six) hours as needed. Rhinorrhea   Yes Historical Provider, MD  doxazosin (CARDURA) 4 MG tablet Take 8 mg by mouth at bedtime.    Yes Historical Provider, MD  metoprolol succinate (TOPROL-XL) 50 MG 24 hr tablet Take 50 mg by mouth daily. Take with or immediately following a meal.   Yes Historical Provider, MD  Multiple Vitamin (MULTIVITAMIN) tablet Take 1 tablet by mouth daily.    Yes Historical Provider, MD  omeprazole (PRILOSEC) 20 MG capsule Take 20 mg by mouth daily.    Yes Historical Provider, MD  pioglitazone (ACTOS) 30 MG tablet Take 30 mg by mouth daily.    Yes Historical Provider, MD  ramipril (ALTACE) 2.5 MG capsule Take 2.5 mg by mouth daily.    Yes Historical Provider, MD  triamterene-hydrochlorothiazide (MAXZIDE) 75-50 MG per tablet Take 0.5 tablets by mouth daily.  09/15/10  Yes Vesta Mixer, MD  nitroGLYCERIN (NITROSTAT) 0.4 MG SL tablet Place 0.4 mg under the tongue every 5 (five) minutes as needed. 09/20/10   Vesta Mixer, MD    Social History:  reports that he quit smoking about 40 years ago. He has never used smokeless tobacco. He reports that he drinks about 6 - 8.4 ounces  of alcohol per week. He reports that he does not use illicit drugs.  Family History  Problem Relation Age of Onset  . Lung cancer Father   . Lung cancer Brother   . Hypertension Mother     Review of Systems:  Constitutional: Denies fever, chills, diaphoresis, appetite change and fatigue.  HEENT: Denies photophobia, eye pain, redness, hearing loss, ear pain, congestion, sore throat, rhinorrhea, sneezing, mouth sores, trouble swallowing, neck pain, neck stiffness and tinnitus.   Respiratory: Denies SOB, DOE, cough, chest tightness,  and wheezing.   Cardiovascular: Denies chest pain, palpitations and leg swelling.  Gastrointestinal:  Denies nausea, vomiting, abdominal pain, diarrhea, constipation, blood in stool and abdominal distention.  Genitourinary: + dysuria, + urgency, + frequency, Denies hematuria, flank pain and difficulty urinating.  Musculoskeletal: Denies myalgias, back pain, joint swelling, arthralgias and gait problem.  Skin: Denies pallor, rash and wound.  Neurological: Denies dizziness, seizures, syncope, weakness, light-headedness, numbness and headaches.  Hematological: Denies adenopathy. Easy bruising, personal or family bleeding history  Psychiatric/Behavioral: Denies suicidal ideation, mood changes, confusion, nervousness, sleep disturbance and agitation   Physical Exam: Blood pressure 119/97, pulse 73, temperature 97.8 F (36.6 C), temperature source Oral, resp. rate 13, height 5\' 7"  (1.702 m), weight 85.276 kg (188 lb), SpO2 98.00%. General: Alert, awake, oriented x3, in no acute distress. HEENT: atraumatic, normocephalic, EOMI, PERRLA, No bruits, no goiter. Heart: Regular rate and rhythm, without murmurs, rubs, gallops. Lungs: Clear to auscultation bilaterally. Abdomen: Soft, nontender, nondistended, positive bowel sounds. No CVA tenderness Extremities: No clubbing cyanosis or edema with positive pedal pulses. Neuro: Grossly intact, nonfocal.   Labs on Admission:  Results for orders placed during the hospital encounter of 08/31/11 (from the past 48 hour(s))  URINALYSIS, ROUTINE W REFLEX MICROSCOPIC     Status: Abnormal   Collection Time   08/31/11 10:33 AM      Component Value Range Comment   Color, Urine AMBER (*) YELLOW BIOCHEMICALS MAY BE AFFECTED BY COLOR   APPearance CLOUDY (*) CLEAR    Specific Gravity, Urine 1.026  1.005 - 1.030    pH 6.0  5.0 - 8.0    Glucose, UA NEGATIVE  NEGATIVE mg/dL    Hgb urine dipstick MODERATE (*) NEGATIVE    Bilirubin Urine SMALL (*) NEGATIVE    Ketones, ur 15 (*) NEGATIVE mg/dL    Protein, ur 30 (*) NEGATIVE mg/dL    Urobilinogen, UA 0.2  0.0 - 1.0 mg/dL     Nitrite NEGATIVE  NEGATIVE    Leukocytes, UA LARGE (*) NEGATIVE   URINE MICROSCOPIC-ADD ON     Status: Abnormal   Collection Time   08/31/11 10:33 AM      Component Value Range Comment   Squamous Epithelial / LPF FEW (*) RARE    WBC, UA TOO NUMEROUS TO COUNT  <3 WBC/hpf    Bacteria, UA FEW (*) RARE    Urine-Other MUCOUS PRESENT     CBC WITH DIFFERENTIAL     Status: Abnormal   Collection Time   08/31/11 10:45 AM      Component Value Range Comment   WBC 21.0 (*) 4.0 - 10.5 K/uL    RBC 3.51 (*) 4.22 - 5.81 MIL/uL    Hemoglobin 11.0 (*) 13.0 - 17.0 g/dL    HCT 16.1 (*) 09.6 - 52.0 %    MCV 92.3  78.0 - 100.0 fL    MCH 31.3  26.0 - 34.0 pg    MCHC 34.0  30.0 - 36.0  g/dL    RDW 16.1  09.6 - 04.5 %    Platelets 326  150 - 400 K/uL    Neutrophils Relative 90 (*) 43 - 77 %    Lymphocytes Relative 3 (*) 12 - 46 %    Monocytes Relative 7  3 - 12 %    Eosinophils Relative 0  0 - 5 %    Basophils Relative 0  0 - 1 %    Neutro Abs 18.9 (*) 1.7 - 7.7 K/uL    Lymphs Abs 0.6 (*) 0.7 - 4.0 K/uL    Monocytes Absolute 1.5 (*) 0.1 - 1.0 K/uL    Eosinophils Absolute 0.0  0.0 - 0.7 K/uL    Basophils Absolute 0.0  0.0 - 0.1 K/uL    WBC Morphology MILD LEFT SHIFT (1-5% METAS, OCC MYELO, OCC BANDS)      Smear Review LARGE PLATELETS PRESENT     POCT I-STAT, CHEM 8     Status: Abnormal   Collection Time   08/31/11 11:04 AM      Component Value Range Comment   Sodium 133 (*) 135 - 145 mEq/L    Potassium 3.6  3.5 - 5.1 mEq/L    Chloride 99  96 - 112 mEq/L    BUN 23  6 - 23 mg/dL    Creatinine, Ser 4.09 (*) 0.50 - 1.35 mg/dL    Glucose, Bld 811 (*) 70 - 99 mg/dL    Calcium, Ion 9.14  1.12 - 1.32 mmol/L    TCO2 21  0 - 100 mmol/L    Hemoglobin 12.2 (*) 13.0 - 17.0 g/dL    HCT 78.2 (*) 95.6 - 52.0 %   LACTIC ACID, PLASMA     Status: Normal   Collection Time   08/31/11 11:55 AM      Component Value Range Comment   Lactic Acid, Venous 1.3  0.5 - 2.2 mmol/L     Radiological Exams on  Admission: No results found.  Assessment/Plan Principal Problem:  *UTI (lower urinary tract infection) - obtain urine culture - Place on rocephin while awaiting urine cultures sensitivities - Monitor vitals/trend WBC count  Active Problems:  HTN (hypertension) - Given initial blood pressure reading will plan on holding blood pressure medication. - If blood pressure elevated tomorrow will plan on resuming home regimen except for lisinopril given his elevated creatinine today.  Acute renal failure - Suspect it is prerenal - Will plan on normal saline - Hold ramipril given elevated creatinine level.   DM (diabetes mellitus) - diabetic diet - continue home regimen (actos) - SSI   COPD (chronic obstructive pulmonary disease) - Stable will place order for albuterol PRN   Time Spent on Admission: >45 minutes placing orders, documenting, medical decision making, physical exam, obtaining history, discussion with patient/ Ed physician.  Penny Pia [facility] Pager: [phone] 08/31/2011, 2:41 PM

## 2011-09-01 DIAGNOSIS — I10 Essential (primary) hypertension: Secondary | ICD-10-CM

## 2011-09-01 DIAGNOSIS — D7289 Other specified disorders of white blood cells: Secondary | ICD-10-CM

## 2011-09-01 DIAGNOSIS — E1165 Type 2 diabetes mellitus with hyperglycemia: Secondary | ICD-10-CM

## 2011-09-01 DIAGNOSIS — E782 Mixed hyperlipidemia: Secondary | ICD-10-CM

## 2011-09-01 DIAGNOSIS — IMO0001 Reserved for inherently not codable concepts without codable children: Secondary | ICD-10-CM

## 2011-09-01 LAB — GLUCOSE, CAPILLARY
Glucose-Capillary: 115 mg/dL — ABNORMAL HIGH (ref 70–99)
Glucose-Capillary: 125 mg/dL — ABNORMAL HIGH (ref 70–99)

## 2011-09-01 LAB — BASIC METABOLIC PANEL
BUN: 16 mg/dL (ref 6–23)
CO2: 21 mEq/L (ref 19–32)
Calcium: 8.5 mg/dL (ref 8.4–10.5)
Chloride: 100 mEq/L (ref 96–112)
Creatinine, Ser: 1.2 mg/dL (ref 0.50–1.35)
GFR calc Af Amer: 63 mL/min — ABNORMAL LOW (ref >90)
GFR calc non Af Amer: 54 mL/min — ABNORMAL LOW (ref >90)
Glucose, Bld: 110 mg/dL — ABNORMAL HIGH (ref 70–99)
Potassium: 3.4 mEq/L — ABNORMAL LOW (ref 3.5–5.1)
Sodium: 130 mEq/L — ABNORMAL LOW (ref 135–145)

## 2011-09-01 LAB — CBC
HCT: 28.3 % — ABNORMAL LOW (ref 39.0–52.0)
Hemoglobin: 9.4 g/dL — ABNORMAL LOW (ref 13.0–17.0)
MCH: 31 pg (ref 26.0–34.0)
MCHC: 33.2 g/dL (ref 30.0–36.0)
MCV: 93.4 fL (ref 78.0–100.0)
Platelets: 256 10*3/uL (ref 150–400)
RBC: 3.03 MIL/uL — ABNORMAL LOW (ref 4.22–5.81)
RDW: 15.2 % (ref 11.5–15.5)
WBC: 16.8 10*3/uL — ABNORMAL HIGH (ref 4.0–10.5)

## 2011-09-01 LAB — HEMOGLOBIN A1C
Hgb A1c MFr Bld: 5.8 % — ABNORMAL HIGH (ref <5.7)
Mean Plasma Glucose: 120 mg/dL — ABNORMAL HIGH (ref <117)

## 2011-09-01 NOTE — Progress Notes (Signed)
Subjective: Feeling a lot better; low grade temp overnight. No CP, no SOB. Denies abdominal pain, nausea and vomiting.  Objective: Vital signs in last 24 hours: Temp:  [99 F (37.2 C)-99.8 F (37.7 C)] 99 F (37.2 C) (06/29 0600) Pulse Rate:  [69-83] 69  (06/29 0600) Resp:  [13-22] 18  (06/29 0600) BP: (81-119)/(50-97) 93/50 mmHg (06/29 0600) SpO2:  [93 %-98 %] 93 % (06/29 0600) Weight:  [85.276 kg (188 lb)] 85.276 kg (188 lb) (06/28 1231) Weight change:     Intake/Output from previous day: 06/28 0701 - 06/29 0700 In: 2000 [I.V.:2000] Out: 1480 [Urine:1480]     Physical Exam: General: Alert, awake, oriented x3, in no acute distress. HEENT: No bruits, no goiter. Heart: Regular rate and rhythm, without murmurs, rubs, gallops. Lungs: Clear to auscultation bilaterally. Abdomen: Soft, nontender, nondistended, positive bowel sounds. Extremities: No clubbing, cyanosis or edema with positive pedal pulses. Neuro: Grossly intact, nonfocal.   Lab Results: Basic Metabolic Panel:  Basename 09/01/11 0603 08/31/11 1104  NA 130* 133*  K 3.4* 3.6  CL 100 99  CO2 21 --  GLUCOSE 110* 134*  BUN 16 23  CREATININE 1.20 1.70*  CALCIUM 8.5 --  MG -- --  PHOS -- --   CBC:  Basename 09/01/11 0603 08/31/11 1104 08/31/11 1045  WBC 16.8* -- 21.0*  NEUTROABS -- -- 18.9*  HGB 9.4* 12.2* --  HCT 28.3* 36.0* --  MCV 93.4 -- 92.3  PLT 256 -- 326   CBG:  Basename 08/31/11 2124 08/31/11 1752  GLUCAP 118* 121*   Hemoglobin A1C:  Basename 08/31/11 1045  HGBA1C 5.8*   Urinalysis:  Basename 08/31/11 1033  COLORURINE AMBER*  LABSPEC 1.026  PHURINE 6.0  GLUCOSEU NEGATIVE  HGBUR MODERATE*  BILIRUBINUR SMALL*  KETONESUR 15*  PROTEINUR 30*  UROBILINOGEN 0.2  NITRITE NEGATIVE  LEUKOCYTESUR LARGE*   Misc. Labs:  Recent Results (from the past 240 hour(s))  CULTURE, BLOOD (ROUTINE X 2)     Status: Normal (Preliminary result)   Collection Time   08/31/11 11:55 AM      Component  Value Range Status Comment   Specimen Description BLOOD EFT HAND   Final    Special Requests BOTTLES DRAWN AEROBIC AND ANAEROBIC EA   Final    Culture  Setup Time 08/31/2011 16:09   Final    Culture     Final    Value:        BLOOD CULTURE RECEIVED NO GROWTH TO DATE CULTURE WILL BE HELD FOR 5 DAYS BEFORE ISSUING A FINAL NEGATIVE REPORT   Report Status PENDING   Incomplete   CULTURE, BLOOD (ROUTINE X 2)     Status: Normal (Preliminary result)   Collection Time   08/31/11 12:00 PM      Component Value Range Status Comment   Specimen Description BLOOD RIGHT ARM   Final    Special Requests BOTTLES DRAWN AEROBIC AND ANAEROBIC EA   Final    Culture  Setup Time 08/31/2011 15:43   Final    Culture     Final    Value:        BLOOD CULTURE RECEIVED NO GROWTH TO DATE CULTURE WILL BE HELD FOR 5 DAYS BEFORE ISSUING A FINAL NEGATIVE REPORT   Report Status PENDING   Incomplete     Medications: Scheduled Meds:   . aspirin  81 mg Oral Daily  . atorvastatin  40 mg Oral q1800  . cefTRIAXone (ROCEPHIN)  IV  1  g Intravenous Once  . cefTRIAXone (ROCEPHIN)  IV  1 g Intravenous Q24H  . doxazosin  8 mg Oral QHS  . heparin  5,000 Units Subcutaneous Q8H  . insulin aspart  0-15 Units Subcutaneous TID WC  . pantoprazole  40 mg Oral Q1200  . pioglitazone  30 mg Oral Daily  . sodium chloride  1,000 mL Intravenous Once  . sodium chloride  1,000 mL Intravenous Once  . DISCONTD: aspirin  81 mg Oral Daily   Continuous Infusions:   . sodium chloride 100 mL/hr at 09/01/11 0045  . DISCONTD: sodium chloride 1,000 mL (08/31/11 1334)   PRN Meds:.acetaminophen, acetaminophen, albuterol, docusate sodium, nitroGLYCERIN, ondansetron (ZOFRAN) IV, ondansetron  Assessment/Plan: 1-UTI (lower urinary tract infection): culture still pending; making progress and feeling better. WBC's improved but still elevated; BP soft despite holding antihypertensive drugs and also with low grade fever. Will follow cx's and symptoms  response, most likely home tomorrow.  2-HTN (hypertension) with hypotensive episode: continue holding antihypertensive drugs.  3-DM (diabetes mellitus): stable; A1C 5.8; continue home meds and SSI.  4-COPD (chronic obstructive pulmonary disease): continue PRN albuterol.  5-Acute renal failure:Cr improving with IVF's; continue holding ACE's inhibitors and continue tx for UTI.  6-HLD: continue statins  7-DVT: continue heparin    LOS: 1 day   Masoud Nyce [facility] [phone]  09/01/2011, 10:59 AM

## 2011-09-02 DIAGNOSIS — IMO0001 Reserved for inherently not codable concepts without codable children: Secondary | ICD-10-CM

## 2011-09-02 DIAGNOSIS — E1165 Type 2 diabetes mellitus with hyperglycemia: Secondary | ICD-10-CM

## 2011-09-02 DIAGNOSIS — E782 Mixed hyperlipidemia: Secondary | ICD-10-CM

## 2011-09-02 DIAGNOSIS — D7289 Other specified disorders of white blood cells: Secondary | ICD-10-CM

## 2011-09-02 DIAGNOSIS — I10 Essential (primary) hypertension: Secondary | ICD-10-CM

## 2011-09-02 LAB — CBC
HCT: 28 % — ABNORMAL LOW (ref 39.0–52.0)
Hemoglobin: 9.4 g/dL — ABNORMAL LOW (ref 13.0–17.0)
MCH: 31.1 pg (ref 26.0–34.0)
MCHC: 33.6 g/dL (ref 30.0–36.0)
MCV: 92.7 fL (ref 78.0–100.0)
Platelets: 252 10*3/uL (ref 150–400)
RBC: 3.02 MIL/uL — ABNORMAL LOW (ref 4.22–5.81)
RDW: 15 % (ref 11.5–15.5)
WBC: 12.6 10*3/uL — ABNORMAL HIGH (ref 4.0–10.5)

## 2011-09-02 LAB — BASIC METABOLIC PANEL
BUN: 14 mg/dL (ref 6–23)
CO2: 23 mEq/L (ref 19–32)
Calcium: 8.5 mg/dL (ref 8.4–10.5)
Chloride: 104 mEq/L (ref 96–112)
Creatinine, Ser: 1.13 mg/dL (ref 0.50–1.35)
GFR calc Af Amer: 68 mL/min — ABNORMAL LOW (ref >90)
GFR calc non Af Amer: 59 mL/min — ABNORMAL LOW (ref >90)
Glucose, Bld: 115 mg/dL — ABNORMAL HIGH (ref 70–99)
Potassium: 3.3 mEq/L — ABNORMAL LOW (ref 3.5–5.1)
Sodium: 135 mEq/L (ref 135–145)

## 2011-09-02 LAB — GLUCOSE, CAPILLARY
Glucose-Capillary: 114 mg/dL — ABNORMAL HIGH (ref 70–99)
Glucose-Capillary: 121 mg/dL — ABNORMAL HIGH (ref 70–99)
Glucose-Capillary: 133 mg/dL — ABNORMAL HIGH (ref 70–99)
Glucose-Capillary: 118 mg/dL — ABNORMAL HIGH (ref 70–99)

## 2011-09-02 LAB — URINE CULTURE
Colony Count: NO GROWTH
Culture: NO GROWTH

## 2011-09-02 MED ORDER — LEVOFLOXACIN 500 MG PO TABS: 500.0000 mg | ORAL_TABLET | Freq: Every day | ORAL | Status: AC

## 2011-09-02 MED ORDER — LEVOFLOXACIN 500 MG PO TABS
500.0000 mg | ORAL_TABLET | Freq: Every day | ORAL | Status: DC
  Filled 2011-09-02: qty 1

## 2011-09-02 NOTE — Discharge Summary (Addendum)
Physician Discharge Summary  Herbert Marquez XBM:[id number] DOB: 04-24-1927 DOA: 08/31/2011  PCP: Thayer Headings, MD  Admit date: 08/31/2011 Discharge date: 09/02/2011  Discharge Diagnoses:  Principal Problem:  *UTI (lower urinary tract infection) Active Problems:  HTN (hypertension)  DM (diabetes mellitus)  COPD (chronic obstructive pulmonary disease)  Acute renal failure   Discharge Condition: Stable  Disposition:   Diet: Diabetic diet (carb modified)  History of present illness:  From original HPI: Patient is an 78 y/o CM that presents to the hospital with the above complaints x 1 day. Subsequently decided to come to the ED for further evaluation. Patient denies taking any recent antibiotics. But does endorse some fevers and chills. Denies any flank pain, nausea, emesis, abdominal discomfort. Nothing he has tried makes the dysuria/urgency/ or frequency better. Wife reports that patient seemed confused last night. But today he is not confused per family and is at baseline.   Hospital Course:   1-UTI (lower urinary tract infection): Urine culture did not isolate any growth; Patient is clinically better and making progress and feeling better. WBC's improved but still elevated and has gone down from 16.8 to 12.6 today. Patient has been afebrile and has improved on IV Rocephin.  Will be placed on Levaquin to complete a five day course of antibiotics.     2-HTN (hypertension): At this point will have patient hold his blood pressure medications as he has been normotensive off of the medication and his last recorded blood pressure was 113/77.  3-DM (diabetes mellitus): stable; A1C 5.8; continue home meds and SSI.   4-COPD (chronic obstructive pulmonary disease): continue PRN albuterol.   5-Acute renal failure:Was secondary to prerenal cause and improved with IVF rehydration and holding his Ace inhibitor.  Will continue to hold his ace I and have him follow up with his primary care  physician to determine whether or not to continue this regimen.   6-HLD: continue statins   7-DVT: While in house patient received heparin for DVT prophylaxis.     Discharge Exam: Filed Vitals:   09/02/11 0725  BP: 113/77  Pulse: 77  Temp: 98.5 F (36.9 C)  Resp: 18   Filed Vitals:   09/01/11 2108 09/01/11 2252 09/02/11 0725 09/02/11 0740  BP: 124/68 124/68 113/77   Pulse:  71 77   Temp:  98.7 F (37.1 C) 98.5 F (36.9 C)   TempSrc:  Oral Oral   Resp:  18 18   Height:      Weight:    90.4 kg (199 lb 4.7 oz)  SpO2:  98% 98%    General: Patient in NAD. A and O x 3 Cardiovascular: RRR normal S1 and S2 Respiratory: Clear to ausculatation, patient speaking in full sentences Abdomen: + BS, NT no guarding, ND  Discharge Instructions:  Patient verbalizes understanding and is agreeable to follow up with his primary care physician once discharged for further evaluation and recommendations.  Discharge Orders    Future Orders Please Complete By Expires   Diet - low sodium heart healthy      Increase activity slowly      Discharge instructions      Comments:   Please take antibiotics as indicated for the next five days.  Also follow up with your primary care physician in 1-2 weeks or sooner should any new concerns arise.  Please discuss with your primary care physician whether you can resume your blood pressure medication.   Call MD for:  temperature >100.4  Call MD for:  redness, tenderness, or signs of infection (pain, swelling, redness, odor or green/yellow discharge around incision site)      Call MD for:  extreme fatigue        Medication List  As of 09/02/2011 10:35 AM   STOP taking these medications         metoprolol succinate 50 MG 24 hr tablet      ramipril 2.5 MG capsule      triamterene-hydrochlorothiazide 75-50 MG per tablet         TAKE these medications         aspirin 81 MG tablet   Take 81 mg by mouth daily.      atorvastatin 40 MG tablet     Commonly known as: LIPITOR   Take 40 mg by mouth daily.      diphenhydrAMINE 25 MG tablet   Commonly known as: BENADRYL   Take 25 mg by mouth every 6 (six) hours as needed. Rhinorrhea      doxazosin 4 MG tablet   Commonly known as: CARDURA   Take 8 mg by mouth at bedtime.      levofloxacin 500 MG tablet   Commonly known as: LEVAQUIN   Take 1 tablet (500 mg total) by mouth daily.      multivitamin tablet   Take 1 tablet by mouth daily.      nitroGLYCERIN 0.4 MG SL tablet   Commonly known as: NITROSTAT   Place 0.4 mg under the tongue every 5 (five) minutes as needed.      omeprazole 20 MG capsule   Commonly known as: PRILOSEC   Take 20 mg by mouth daily.      pioglitazone 30 MG tablet   Commonly known as: ACTOS   Take 30 mg by mouth daily.              The results of significant diagnostics from this hospitalization (including imaging, microbiology, ancillary and laboratory) are listed below for reference.    Significant Diagnostic Studies: No results found.  Microbiology: Recent Results (from the past 240 hour(s))  URINE CULTURE     Status: Normal   Collection Time   08/31/11 10:33 AM      Component Value Range Status Comment   Specimen Description URINE, CATHETERIZED   Final    Special Requests NONE   Final    Culture  Setup Time 09/01/2011 01:25   Final    Colony Count NO GROWTH   Final    Culture NO GROWTH   Final    Report Status 09/02/2011 FINAL   Final   CULTURE, BLOOD (ROUTINE X 2)     Status: Normal (Preliminary result)   Collection Time   08/31/11 11:55 AM      Component Value Range Status Comment   Specimen Description BLOOD EFT HAND   Final    Special Requests BOTTLES DRAWN AEROBIC AND ANAEROBIC EA   Final    Culture  Setup Time 08/31/2011 16:09   Final    Culture     Final    Value:        BLOOD CULTURE RECEIVED NO GROWTH TO DATE CULTURE WILL BE HELD FOR 5 DAYS BEFORE ISSUING A FINAL NEGATIVE REPORT   Report Status PENDING   Incomplete    CULTURE, BLOOD (ROUTINE X 2)     Status: Normal (Preliminary result)   Collection Time   08/31/11 12:00 PM      Component Value  Range Status Comment   Specimen Description BLOOD RIGHT ARM   Final    Special Requests BOTTLES DRAWN AEROBIC AND ANAEROBIC EA   Final    Culture  Setup Time 08/31/2011 15:43   Final    Culture     Final    Value:        BLOOD CULTURE RECEIVED NO GROWTH TO DATE CULTURE WILL BE HELD FOR 5 DAYS BEFORE ISSUING A FINAL NEGATIVE REPORT   Report Status PENDING   Incomplete      Labs: Basic Metabolic Panel:  Lab 09/02/11 4540 09/01/11 0603 08/31/11 1104  NA 135 130* 133*  K 3.3* 3.4* --  CL 104 100 99  CO2 23 21 --  GLUCOSE 115* 110* 134*  BUN 14 16 23   CREATININE 1.13 1.20 1.70*  CALCIUM 8.5 8.5 --  MG -- -- --  PHOS -- -- --   Liver Function Tests: No results found for this basename: AST:5,ALT:5,ALKPHOS:5,BILITOT:5,PROT:5,ALBUMIN:5 in the last 168 hours No results found for this basename: LIPASE:5,AMYLASE:5 in the last 168 hours No results found for this basename: AMMONIA:5 in the last 168 hours CBC:  Lab 09/02/11 0540 09/01/11 0603 08/31/11 1104 08/31/11 1045  WBC 12.6* 16.8* -- 21.0*  NEUTROABS -- -- -- 18.9*  HGB 9.4* 9.4* 12.2* 11.0*  HCT 28.0* 28.3* 36.0* 32.4*  MCV 92.7 93.4 -- 92.3  PLT 252 256 -- 326   Cardiac Enzymes: No results found for this basename: CKTOTAL:5,CKMB:5,CKMBINDEX:5,TROPONINI:5 in the last 168 hours BNP: No components found with this basename: POCBNP:5 CBG:  Lab 09/02/11 0801 09/01/11 2136 09/01/11 1722 09/01/11 1112 09/01/11 0748  GLUCAP 133* 121* 114* 125* 115*    Time coordinating discharge: 35 minutes placing orders, medical decision making, discussion with patient and wife, documenting, billing.  Signed:  Penny Pia  [facility] 09/02/2011, 10:35 AM    Addendum:  Anemia:  There has been no active bleeding this admission.  Patient was initially dry and has been given IVF's this  admission.  As a result his h/h is lower on discharge.  I will have him follow up with his primary care physician in a couple of weeks for further evaluation.  Patient may require colonoscopy testing if this has not already been done.  Patient is aware and agreeable.

## 2011-09-06 LAB — CULTURE, BLOOD (ROUTINE X 2)
Culture: NO GROWTH
Culture: NO GROWTH

## 2011-09-12 DIAGNOSIS — Z23 Encounter for immunization: Secondary | ICD-10-CM | POA: Diagnosis not present

## 2011-09-12 DIAGNOSIS — I10 Essential (primary) hypertension: Secondary | ICD-10-CM | POA: Diagnosis not present

## 2011-09-12 DIAGNOSIS — E785 Hyperlipidemia, unspecified: Secondary | ICD-10-CM | POA: Diagnosis not present

## 2011-09-12 DIAGNOSIS — Z125 Encounter for screening for malignant neoplasm of prostate: Secondary | ICD-10-CM | POA: Diagnosis not present

## 2011-09-12 DIAGNOSIS — E1129 Type 2 diabetes mellitus with other diabetic kidney complication: Secondary | ICD-10-CM | POA: Diagnosis not present

## 2011-09-12 DIAGNOSIS — Z0000 Encounter for general adult medical examination without abnormal findings: Secondary | ICD-10-CM | POA: Diagnosis not present

## 2011-09-12 DIAGNOSIS — M810 Age-related osteoporosis without current pathological fracture: Secondary | ICD-10-CM | POA: Diagnosis not present

## 2011-09-18 DIAGNOSIS — E785 Hyperlipidemia, unspecified: Secondary | ICD-10-CM | POA: Diagnosis not present

## 2011-09-18 DIAGNOSIS — I10 Essential (primary) hypertension: Secondary | ICD-10-CM | POA: Diagnosis not present

## 2011-09-18 DIAGNOSIS — E1129 Type 2 diabetes mellitus with other diabetic kidney complication: Secondary | ICD-10-CM | POA: Diagnosis not present

## 2011-09-18 DIAGNOSIS — I2510 Atherosclerotic heart disease of native coronary artery without angina pectoris: Secondary | ICD-10-CM | POA: Diagnosis not present

## 2011-09-18 DIAGNOSIS — N183 Chronic kidney disease, stage 3 unspecified: Secondary | ICD-10-CM | POA: Diagnosis not present

## 2011-10-01 DIAGNOSIS — R350 Frequency of micturition: Secondary | ICD-10-CM | POA: Diagnosis not present

## 2011-10-01 DIAGNOSIS — R351 Nocturia: Secondary | ICD-10-CM | POA: Diagnosis not present

## 2011-10-01 DIAGNOSIS — N390 Urinary tract infection, site not specified: Secondary | ICD-10-CM | POA: Diagnosis not present

## 2011-10-01 DIAGNOSIS — R300 Dysuria: Secondary | ICD-10-CM | POA: Diagnosis not present

## 2011-10-12 DIAGNOSIS — M810 Age-related osteoporosis without current pathological fracture: Secondary | ICD-10-CM | POA: Diagnosis not present

## 2011-10-15 DIAGNOSIS — K648 Other hemorrhoids: Secondary | ICD-10-CM | POA: Diagnosis not present

## 2011-10-15 DIAGNOSIS — Z1211 Encounter for screening for malignant neoplasm of colon: Secondary | ICD-10-CM | POA: Diagnosis not present

## 2011-10-15 DIAGNOSIS — D126 Benign neoplasm of colon, unspecified: Secondary | ICD-10-CM | POA: Diagnosis not present

## 2011-10-15 DIAGNOSIS — K5730 Diverticulosis of large intestine without perforation or abscess without bleeding: Secondary | ICD-10-CM | POA: Diagnosis not present

## 2011-10-25 DIAGNOSIS — N401 Enlarged prostate with lower urinary tract symptoms: Secondary | ICD-10-CM | POA: Diagnosis not present

## 2011-11-20 ENCOUNTER — Encounter: Payer: Self-pay | Admitting: Cardiovascular Disease

## 2011-11-20 ENCOUNTER — Ambulatory Visit (INDEPENDENT_AMBULATORY_CARE_PROVIDER_SITE_OTHER): Payer: Medicare Other | Admitting: Cardiovascular Disease

## 2011-11-20 VITALS — BP 116/73 | HR 66 | Ht 67.0 in | Wt 191.8 lb

## 2011-11-20 DIAGNOSIS — I2510 Atherosclerotic heart disease of native coronary artery without angina pectoris: Secondary | ICD-10-CM

## 2011-11-20 MED ORDER — ATORVASTATIN CALCIUM 40 MG PO TABS: 40.0000 mg | ORAL_TABLET | Freq: Every day | ORAL | Status: DC

## 2011-11-20 MED ORDER — NITROGLYCERIN 0.4 MG SL SUBL: 0.4000 mg | SUBLINGUAL_TABLET | SUBLINGUAL | Status: DC | PRN

## 2011-11-20 NOTE — Assessment & Plan Note (Signed)
[name] is doing well. He's not had any episodes of angina. We'll continue with the same medications. I seen again in 6 months. We'll check fasting labs including lipid profile, hepatic profile, and basic metabolic profile at that time.

## 2011-11-20 NOTE — Progress Notes (Signed)
Herbert Marquez Date of Birth  1927-07-08 [facility]     [place] [facility]  [address]    [address]   [address] [place], [place]  [address]    [place], [place]  [address] [phone]  Fax  [phone]  [phone]  Fax [phone]  Problem list: 1. Coronary artery disease-status post PTCA and stenting of the RCA in [place], [place] 2. Dyslipidemia 3. Hypertension 4. Diabetes mellitus 5. Hernia repair   History of Present Illness:  Pt has done well from a cardiac standpoint.  He fell and broke his left hip in January .  His is recovering well.  He is still walking with a cane.  He had no complications during or after surgery.  He had a severe UTI in June / July of 2013.  He has been seen by Urology and was started on Tamulosin for BPH.    He's not had any episodes of chest pain or shortness breath. He still walks with a cane but is able to get out and stays fairly active.  Current Outpatient Prescriptions on File Prior to Visit  Medication Sig Dispense Refill  . aspirin 81 MG tablet Take 81 mg by mouth daily.       [name] atorvastatin (LIPITOR) 40 MG tablet Take 40 mg by mouth daily.       . diphenhydrAMINE (BENADRYL) 25 MG tablet Take 25 mg by mouth every 6 (six) hours as needed. Rhinorrhea      . doxazosin (CARDURA) 4 MG tablet Take 8 mg by mouth at bedtime.       . Multiple Vitamin (MULTIVITAMIN) tablet Take 1 tablet by mouth daily.       . nitroGLYCERIN (NITROSTAT) 0.4 MG SL tablet Place 0.4 mg under the tongue every 5 (five) minutes as needed.      [name] omeprazole (PRILOSEC) 20 MG capsule Take 20 mg by mouth daily.       . pioglitazone (ACTOS) 30 MG tablet Take 30 mg by mouth daily.         No Known Allergies  Past Medical History  Diagnosis Date  . Coronary artery disease   . Hyperlipidemia   . Hypertension   . Diabetes mellitus   . BPH (benign prostatic hypertrophy)   . TIA (transient ischemic attack)     Approximately 6 weeks  post-cardiac catheterization.   . MI (myocardial infarction)     Past Surgical History  Procedure Date  . Coronary angioplasty with stent placement 10/2004    stenting x 2 to RCA  . Hernia repair   . Cataract extraction, bilateral   . Cardiovascular stress test 07/01/2007    EF 74%  . Hip arthroplasty 03/09/2011    Procedure: ARTHROPLASTY BIPOLAR HIP;  Surgeon: Shelda Pal;  Location: [facility];  Service: Orthopedics;  Laterality: Left;    History  Smoking status  . Former Smoker  . Quit date: 03/06/1971  Smokeless tobacco  . Never Used    History  Alcohol Use  . 6.0 - 8.4 oz/week  . 10-14 Glasses of wine per week    Family History  Problem Relation Age of Onset  . Lung cancer Father   . Lung cancer Brother   . Hypertension Mother     Reviw of Systems:  Reviewed in the HPI.  All other systems are negative.  Physical Exam: Blood pressure 116/73, pulse 66, height 5\' 7"  (1.702 m), weight 191 lb 12.8 oz (87 kg).  General: Well developed, well nourished, in no acute distress.  Head: Normocephalic, atraumatic, sclera non-icteric, mucus membranes are moist,   Neck: Supple. Negative for carotid bruits. JVD not elevated.  Lungs: Clear bilaterally to auscultation without wheezes, rales, or rhonchi. Breathing is unlabored.  Heart: RRR with S1 S2. No murmurs, rubs, or gallops appreciated.  Abdomen: Soft, non-tender, non-distended with normoactive bowel sounds. No hepatomegaly. No rebound/guarding. No obvious abdominal masses.  Msk:  Strength and tone appear normal for age.  Extremities: No clubbing or cyanosis. No edema.  Distal pedal pulses are 2+ and equal bilaterally.  Neuro: Alert and oriented X 3. Moves all extremities spontaneously.  Psych:  Responds to questions appropriately with a normal affect.  ECG:  Assessment / Plan:

## 2011-11-20 NOTE — Patient Instructions (Addendum)
Your physician wants you to follow-up in: 6 months/ ekg   You will receive a reminder letter in the mail two months in advance. If you don't receive a letter, please call our office to schedule the follow-up appointment.  Your physician recommends that you return for a FASTING lipid profile: 6 months

## 2011-11-23 ENCOUNTER — Encounter: Payer: Self-pay | Admitting: Cardiovascular Disease

## 2011-11-26 DIAGNOSIS — N401 Enlarged prostate with lower urinary tract symptoms: Secondary | ICD-10-CM | POA: Diagnosis not present

## 2011-12-31 DIAGNOSIS — H2640 Unspecified secondary cataract: Secondary | ICD-10-CM | POA: Diagnosis not present

## 2011-12-31 DIAGNOSIS — H43819 Vitreous degeneration, unspecified eye: Secondary | ICD-10-CM | POA: Diagnosis not present

## 2011-12-31 DIAGNOSIS — E119 Type 2 diabetes mellitus without complications: Secondary | ICD-10-CM | POA: Diagnosis not present

## 2011-12-31 DIAGNOSIS — H01009 Unspecified blepharitis unspecified eye, unspecified eyelid: Secondary | ICD-10-CM | POA: Diagnosis not present

## 2012-01-03 DIAGNOSIS — Z23 Encounter for immunization: Secondary | ICD-10-CM | POA: Diagnosis not present

## 2012-01-15 DIAGNOSIS — H2640 Unspecified secondary cataract: Secondary | ICD-10-CM | POA: Diagnosis not present

## 2012-01-15 DIAGNOSIS — H26499 Other secondary cataract, unspecified eye: Secondary | ICD-10-CM | POA: Diagnosis not present

## 2012-03-20 ENCOUNTER — Other Ambulatory Visit: Payer: Self-pay | Admitting: Dermatology

## 2012-03-20 DIAGNOSIS — L570 Actinic keratosis: Secondary | ICD-10-CM | POA: Diagnosis not present

## 2012-03-20 DIAGNOSIS — L821 Other seborrheic keratosis: Secondary | ICD-10-CM | POA: Diagnosis not present

## 2012-03-20 DIAGNOSIS — D485 Neoplasm of uncertain behavior of skin: Secondary | ICD-10-CM | POA: Diagnosis not present

## 2012-03-20 DIAGNOSIS — C44320 Squamous cell carcinoma of skin of unspecified parts of face: Secondary | ICD-10-CM | POA: Diagnosis not present

## 2012-04-08 DIAGNOSIS — I10 Essential (primary) hypertension: Secondary | ICD-10-CM | POA: Diagnosis not present

## 2012-04-08 DIAGNOSIS — E1129 Type 2 diabetes mellitus with other diabetic kidney complication: Secondary | ICD-10-CM | POA: Diagnosis not present

## 2012-04-08 DIAGNOSIS — M810 Age-related osteoporosis without current pathological fracture: Secondary | ICD-10-CM | POA: Diagnosis not present

## 2012-04-08 DIAGNOSIS — E785 Hyperlipidemia, unspecified: Secondary | ICD-10-CM | POA: Diagnosis not present

## 2012-04-15 DIAGNOSIS — I2510 Atherosclerotic heart disease of native coronary artery without angina pectoris: Secondary | ICD-10-CM | POA: Diagnosis not present

## 2012-04-15 DIAGNOSIS — I10 Essential (primary) hypertension: Secondary | ICD-10-CM | POA: Diagnosis not present

## 2012-04-15 DIAGNOSIS — E1129 Type 2 diabetes mellitus with other diabetic kidney complication: Secondary | ICD-10-CM | POA: Diagnosis not present

## 2012-04-15 DIAGNOSIS — E785 Hyperlipidemia, unspecified: Secondary | ICD-10-CM | POA: Diagnosis not present

## 2012-04-15 DIAGNOSIS — N183 Chronic kidney disease, stage 3 unspecified: Secondary | ICD-10-CM | POA: Diagnosis not present

## 2012-04-25 ENCOUNTER — Telehealth: Payer: Self-pay | Admitting: Cardiovascular Disease

## 2012-04-25 MED ORDER — METOPROLOL SUCCINATE ER 50 MG PO TB24: 50.0000 mg | ORAL_TABLET | Freq: Every day | ORAL | Status: DC

## 2012-04-25 NOTE — Telephone Encounter (Signed)
Pt needs refill of metoprolol 100mg  90 day supply [facility], said it was requested first of the week and still not there, pt out now, needs asap

## 2012-04-25 NOTE — Telephone Encounter (Signed)
Pt called for metoprolol. Did not get request. Noticed pt metoprolol was not on his med list. While doing research on what happened to his metoprolol, I noticed the mg that pt was on was 50mg  and patient had called asking for 100mg . Called patient to verify and he states he has always been on 100mg . Called pharmacy to verify mg and they state last two refills (Apr 13, 2011 & Nov. 2013) has been for 100mg . According to notes on fill, on 03-12-57 Dr. Conley Canal authorized pt toprol xl to be d/c. Then on 04-12-11 pt metoprolol was switched to 50mg . Per patient, his BP has been really good (116/68) while taking 100mg . Per pt his heart rate has been 64. Per Doylene Bode (clinical Service manager) to fill pt metoprolol for 50mg  until Dr. Elease Hashimoto comes in office to ask him. Informed patient of the change and to take his BP this wekend and will call him back with what Dr. Elease Hashimoto decides. Pt agreed.

## 2012-04-28 MED ORDER — METOPROLOL SUCCINATE ER 100 MG PO TB24: 100.0000 mg | ORAL_TABLET | Freq: Every day | ORAL | Status: DC

## 2012-04-28 NOTE — Telephone Encounter (Signed)
VERIFIED W/ DR NAHSER PT TO CONTINUE 100 MG DAILY, PT TO CONTINUE TO MONITOR BP/P AND CALL WITH QUESTIONS, PT VERBALIZED UNDERSTANDING.

## 2012-04-28 NOTE — Telephone Encounter (Signed)
Left message with patient wife to call back with BP & heart rate readings

## 2012-05-19 DIAGNOSIS — N401 Enlarged prostate with lower urinary tract symptoms: Secondary | ICD-10-CM | POA: Diagnosis not present

## 2012-06-24 ENCOUNTER — Other Ambulatory Visit: Payer: Self-pay | Admitting: *Deleted

## 2012-06-24 MED ORDER — ATORVASTATIN CALCIUM 40 MG PO TABS: 40.0000 mg | ORAL_TABLET | Freq: Every day | ORAL | Status: DC

## 2012-06-25 ENCOUNTER — Other Ambulatory Visit: Payer: Self-pay | Admitting: *Deleted

## 2012-06-25 MED ORDER — ATORVASTATIN CALCIUM 40 MG PO TABS: 40.0000 mg | ORAL_TABLET | Freq: Every day | ORAL | Status: DC

## 2012-06-25 MED ORDER — METOPROLOL SUCCINATE ER 100 MG PO TB24: 100.0000 mg | ORAL_TABLET | Freq: Every day | ORAL | Status: DC

## 2012-06-25 NOTE — Telephone Encounter (Signed)
Pt calling to get meds to 90 tablets. Pt aware of change. Fax Received. Refill Completed. Tamla Winkels Chowoe (R.M.A)

## 2012-08-08 ENCOUNTER — Other Ambulatory Visit: Payer: Self-pay | Admitting: *Deleted

## 2012-08-08 ENCOUNTER — Telehealth: Payer: Self-pay | Admitting: Cardiovascular Disease

## 2012-08-08 NOTE — Telephone Encounter (Signed)
Chart reviewed, on [facility] 09/02/11 medications were stopped that pt has continued on, pt states he feels fine bp have been controled, they had their pcp fill the Maxzide, pt was made an app and asked to bring all med bottles with them for his app, wife agreed to plan. No meds refilled.

## 2012-08-08 NOTE — Telephone Encounter (Signed)
New Problem:    Patient called in returning a call from our office.  Please call back.

## 2012-08-08 NOTE — Telephone Encounter (Signed)
Pharmacy is requesting refills. Noticed medication was not showing up on medication list. Called pt to verify if he was still taking medication. Wife stated pt is still taking medication. Wife stated that she will call office back to let use know if she still wants Dr. Elease Hashimoto to keep filling pt Maxzide.

## 2012-09-18 DIAGNOSIS — L909 Atrophic disorder of skin, unspecified: Secondary | ICD-10-CM | POA: Diagnosis not present

## 2012-09-18 DIAGNOSIS — L919 Hypertrophic disorder of the skin, unspecified: Secondary | ICD-10-CM | POA: Diagnosis not present

## 2012-09-18 DIAGNOSIS — Z85828 Personal history of other malignant neoplasm of skin: Secondary | ICD-10-CM | POA: Diagnosis not present

## 2012-09-18 DIAGNOSIS — L821 Other seborrheic keratosis: Secondary | ICD-10-CM | POA: Diagnosis not present

## 2012-09-18 DIAGNOSIS — L570 Actinic keratosis: Secondary | ICD-10-CM | POA: Diagnosis not present

## 2012-10-02 ENCOUNTER — Ambulatory Visit (INDEPENDENT_AMBULATORY_CARE_PROVIDER_SITE_OTHER): Payer: Medicare Other | Admitting: Cardiovascular Disease

## 2012-10-02 ENCOUNTER — Encounter: Payer: Self-pay | Admitting: Cardiovascular Disease

## 2012-10-02 VITALS — BP 132/76 | HR 55 | Ht 67.0 in | Wt 198.2 lb

## 2012-10-02 DIAGNOSIS — I2510 Atherosclerotic heart disease of native coronary artery without angina pectoris: Secondary | ICD-10-CM

## 2012-10-02 NOTE — Progress Notes (Signed)
Herbert Marquez Date of Birth  [birth year] [facility]     [place] Office  [address]    [address]   [address] [place], [place]  [address]    [place], [place]  [address] [phone]  Fax  [phone]  [phone]  Fax [phone]  Problem list: 1. Coronary artery disease-status post PTCA and stenting of the RCA in [place], [place] 2. Dyslipidemia 3. Hypertension 4. Diabetes mellitus 5. Hernia repair   History of Present Illness:  Pt has done well from a cardiac standpoint.  He fell and broke his left hip in January .  His is recovering well.  He is still walking with a cane.  He had no complications during or after surgery.  He had a severe UTI in June / July of 2013.  He has been seen by Urology and was started on Tamulosin for BPH.   He's not had any episodes of chest pain or shortness breath. He still walks with a cane but is able to get out and stays fairly active.  October 02, 2012:  Mr. Lamadrid is doing well.  No CP , no dyspnea.  He remains active - went to the beach last week.  He walks at [facility] when he is down there.  Goes to the [facility] 3 times a week.  No angina.    Current Outpatient Prescriptions on File Prior to Visit  Medication Sig Dispense Refill  . aspirin 81 MG tablet Take 81 mg by mouth daily.       [name] atorvastatin (LIPITOR) 40 MG tablet Take 1 tablet (40 mg total) by mouth daily.  90 tablet  1  . diphenhydrAMINE (BENADRYL) 25 MG tablet Take 25 mg by mouth every 6 (six) hours as needed. Rhinorrhea      . metoprolol succinate (TOPROL-XL) 100 MG 24 hr tablet Take 1 tablet (100 mg total) by mouth daily. Take with or immediately following a meal.  90 tablet  1  . Multiple Vitamin (MULTIVITAMIN) tablet Take 1 tablet by mouth daily.       . nitroGLYCERIN (NITROSTAT) 0.4 MG SL tablet Place 1 tablet (0.4 mg total) under the tongue every 5 (five) minutes as needed.  25 tablet  5  . omeprazole (PRILOSEC) 20 MG capsule Take 20 mg by mouth  daily.       . pioglitazone (ACTOS) 30 MG tablet Take 30 mg by mouth daily.       . Tamsulosin HCl (FLOMAX) 0.4 MG CAPS Take by mouth daily after supper.       No current facility-administered medications on file prior to visit.    No Known Allergies  Past Medical History  Diagnosis Date  . Coronary artery disease   . Hyperlipidemia   . Hypertension   . Diabetes mellitus   . BPH (benign prostatic hypertrophy)   . TIA (transient ischemic attack)     Approximately 6 weeks post-cardiac catheterization.   . MI (myocardial infarction)     Past Surgical History  Procedure Laterality Date  . Coronary angioplasty with stent placement  10/2004    stenting x 2 to RCA  . Hernia repair    . Cataract extraction, bilateral    . Cardiovascular stress test  07/01/2007    EF 74%  . Hip arthroplasty  03/09/2011    Procedure: ARTHROPLASTY BIPOLAR HIP;  Surgeon: Shelda Pal;  Location: [facility];  Service: Orthopedics;  Laterality: Left;    History  Smoking status  . Former Smoker  . Quit date: 03/06/1971  Smokeless tobacco  . Never Used    History  Alcohol Use  . 6 - 8.4 oz/week  . 10-14 Glasses of wine per week    Family History  Problem Relation Age of Onset  . Lung cancer Father   . Lung cancer Brother   . Hypertension Mother     Reviw of Systems:  Reviewed in the HPI.  All other systems are negative.  Physical Exam: Blood pressure 132/76, pulse 55, height 5\' 7"  (1.702 m), weight 198 lb 3.2 oz (89.903 kg). General: Well developed, well nourished, in no acute distress.  Head: Normocephalic, atraumatic, sclera non-icteric, mucus membranes are moist,   Neck: Supple. Negative for carotid bruits. JVD not elevated.  Lungs: Clear bilaterally to auscultation without wheezes, rales, or rhonchi. Breathing is unlabored.  Heart: RRR with S1 S2. No murmurs, rubs, or gallops appreciated.  Abdomen: Soft, non-tender, non-distended with normoactive bowel sounds. No hepatomegaly. No  rebound/guarding. No obvious abdominal masses.  Msk:  Strength and tone appear normal for age.  Extremities: No clubbing or cyanosis. No edema.  Distal pedal pulses are 2+ and equal bilaterally.  Neuro: Alert and oriented X 3. Moves all extremities spontaneously.  Psych:  Responds to questions appropriately with a normal affect.  ECG: October 02, 2012:  Sinus brady at 27. LAD, LVH  Assessment / Plan:

## 2012-10-02 NOTE — Assessment & Plan Note (Signed)
Mr. Hicks is doing well. He's not had any episodes of chest pain or shortness breath. His atorvastatin/lipids are being managed by his general medical Dr. He scheduled have labs at his office next week.  He remains active he exercises at the [facility] several times a week without angina.

## 2012-10-02 NOTE — Patient Instructions (Addendum)
Your physician wants you to follow-up in: 6 months  You will receive a reminder letter in the mail two months in advance. If you don't receive a letter, please call our office to schedule the follow-up appointment.  Your physician recommends that you continue on your current medications as directed. Please refer to the Current Medication list given to you today.  

## 2012-10-13 ENCOUNTER — Encounter: Payer: Self-pay | Admitting: Cardiovascular Disease

## 2012-10-13 DIAGNOSIS — Z0000 Encounter for general adult medical examination without abnormal findings: Secondary | ICD-10-CM | POA: Diagnosis not present

## 2012-10-13 DIAGNOSIS — Z1331 Encounter for screening for depression: Secondary | ICD-10-CM | POA: Diagnosis not present

## 2012-10-13 DIAGNOSIS — M810 Age-related osteoporosis without current pathological fracture: Secondary | ICD-10-CM | POA: Diagnosis not present

## 2012-10-13 DIAGNOSIS — Z125 Encounter for screening for malignant neoplasm of prostate: Secondary | ICD-10-CM | POA: Diagnosis not present

## 2012-10-13 DIAGNOSIS — I10 Essential (primary) hypertension: Secondary | ICD-10-CM | POA: Diagnosis not present

## 2012-10-13 DIAGNOSIS — E1129 Type 2 diabetes mellitus with other diabetic kidney complication: Secondary | ICD-10-CM | POA: Diagnosis not present

## 2012-10-20 DIAGNOSIS — I10 Essential (primary) hypertension: Secondary | ICD-10-CM | POA: Diagnosis not present

## 2012-10-20 DIAGNOSIS — E1129 Type 2 diabetes mellitus with other diabetic kidney complication: Secondary | ICD-10-CM | POA: Diagnosis not present

## 2012-10-20 DIAGNOSIS — I2510 Atherosclerotic heart disease of native coronary artery without angina pectoris: Secondary | ICD-10-CM | POA: Diagnosis not present

## 2012-10-20 DIAGNOSIS — E785 Hyperlipidemia, unspecified: Secondary | ICD-10-CM | POA: Diagnosis not present

## 2012-11-21 DIAGNOSIS — N401 Enlarged prostate with lower urinary tract symptoms: Secondary | ICD-10-CM | POA: Diagnosis not present

## 2012-12-11 DIAGNOSIS — Z23 Encounter for immunization: Secondary | ICD-10-CM | POA: Diagnosis not present

## 2012-12-19 DIAGNOSIS — E119 Type 2 diabetes mellitus without complications: Secondary | ICD-10-CM | POA: Diagnosis not present

## 2012-12-19 DIAGNOSIS — H04129 Dry eye syndrome of unspecified lacrimal gland: Secondary | ICD-10-CM | POA: Diagnosis not present

## 2012-12-19 DIAGNOSIS — H01009 Unspecified blepharitis unspecified eye, unspecified eyelid: Secondary | ICD-10-CM | POA: Diagnosis not present

## 2012-12-19 DIAGNOSIS — H02059 Trichiasis without entropian unspecified eye, unspecified eyelid: Secondary | ICD-10-CM | POA: Diagnosis not present

## 2012-12-23 ENCOUNTER — Other Ambulatory Visit: Payer: Self-pay | Admitting: *Deleted

## 2012-12-23 MED ORDER — ATORVASTATIN CALCIUM 40 MG PO TABS: 40.0000 mg | ORAL_TABLET | Freq: Every day | ORAL | Status: DC

## 2012-12-24 ENCOUNTER — Telehealth: Payer: Self-pay

## 2012-12-24 DIAGNOSIS — R310 Gross hematuria: Secondary | ICD-10-CM | POA: Diagnosis not present

## 2012-12-24 DIAGNOSIS — R300 Dysuria: Secondary | ICD-10-CM | POA: Diagnosis not present

## 2012-12-24 DIAGNOSIS — N390 Urinary tract infection, site not specified: Secondary | ICD-10-CM | POA: Diagnosis not present

## 2012-12-24 NOTE — Telephone Encounter (Signed)
Patient wife called wanting rx for liptor , she states that the pharm said that they did not have it I called the pharm and it was on hold

## 2013-01-08 DIAGNOSIS — N411 Chronic prostatitis: Secondary | ICD-10-CM | POA: Diagnosis not present

## 2013-01-08 DIAGNOSIS — R300 Dysuria: Secondary | ICD-10-CM | POA: Diagnosis not present

## 2013-01-26 DIAGNOSIS — D485 Neoplasm of uncertain behavior of skin: Secondary | ICD-10-CM | POA: Diagnosis not present

## 2013-01-26 DIAGNOSIS — H01009 Unspecified blepharitis unspecified eye, unspecified eyelid: Secondary | ICD-10-CM | POA: Diagnosis not present

## 2013-03-09 DIAGNOSIS — Z961 Presence of intraocular lens: Secondary | ICD-10-CM | POA: Diagnosis not present

## 2013-03-09 DIAGNOSIS — H16109 Unspecified superficial keratitis, unspecified eye: Secondary | ICD-10-CM | POA: Diagnosis not present

## 2013-03-09 DIAGNOSIS — D485 Neoplasm of uncertain behavior of skin: Secondary | ICD-10-CM | POA: Diagnosis not present

## 2013-03-09 DIAGNOSIS — H02059 Trichiasis without entropian unspecified eye, unspecified eyelid: Secondary | ICD-10-CM | POA: Diagnosis not present

## 2013-04-03 ENCOUNTER — Encounter: Payer: Self-pay | Admitting: Cardiovascular Disease

## 2013-04-03 ENCOUNTER — Ambulatory Visit (INDEPENDENT_AMBULATORY_CARE_PROVIDER_SITE_OTHER): Payer: Medicare Other | Admitting: Cardiovascular Disease

## 2013-04-03 VITALS — BP 130/76 | HR 58 | Ht 67.0 in | Wt 197.4 lb

## 2013-04-03 DIAGNOSIS — I2510 Atherosclerotic heart disease of native coronary artery without angina pectoris: Secondary | ICD-10-CM | POA: Diagnosis not present

## 2013-04-03 DIAGNOSIS — I10 Essential (primary) hypertension: Secondary | ICD-10-CM

## 2013-04-03 NOTE — Progress Notes (Signed)
Herbert Marquez Date of Birth  26-Jun-1927 [place]  [address]    [place]   [place], [place]  [address]    [place], [place]  [address] [phone]  Fax  [phone]  [phone]  Fax [phone]  Problem list: 1. Coronary artery disease-status post PTCA and stenting of the RCA in [place], [place] 2. Dyslipidemia 3. Hypertension 4. Diabetes mellitus 5. Hernia repair   History of Present Illness:  Pt has done well from a cardiac standpoint.  He fell and broke his left hip in January .  His is recovering well.  He is still walking with a cane.  He had no complications during or after surgery.  He had a severe UTI in June / July of 2013.  He has been seen by Urology and was started on Tamulosin for BPH.   He's not had any episodes of chest pain or shortness breath. He still walks with a cane but is able to get out and stays fairly active.  October 02, 2012:  Herbert Marquez is doing well.  No CP , no dyspnea.  He remains active - went to the beach last week.  He walks at [facility] when he is down there.  Goes to the [facility] 3 times a week.  No angina.   Jan. 30, 2015:  Herbert Marquez is doing well.  He has not had any cardiac problems.  He has been to the beach recently.   He goes to the [facility] 2-3 times a week. He doesn't have any angina.    His primary medical doctor is managing his lipids.    Current Outpatient Prescriptions on File Prior to Visit  Medication Sig Dispense Refill  . aspirin 81 MG tablet Take 81 mg by mouth daily.       [name] atorvastatin (LIPITOR) 40 MG tablet Take 1 tablet (40 mg total) by mouth daily.  90 tablet  1  . diphenhydrAMINE (BENADRYL) 25 MG tablet Take 25 mg by mouth every 6 (six) hours as needed. Rhinorrhea      . metoprolol succinate (TOPROL-XL) 100 MG 24 hr tablet Take 1 tablet (100 mg total) by mouth daily. Take with or immediately following a meal.  90 tablet  1  . Multiple Vitamin  (MULTIVITAMIN) tablet Take 1 tablet by mouth daily.       . nitroGLYCERIN (NITROSTAT) 0.4 MG SL tablet Place 1 tablet (0.4 mg total) under the tongue every 5 (five) minutes as needed.  25 tablet  5  . omeprazole (PRILOSEC) 20 MG capsule Take 20 mg by mouth daily.       . pioglitazone (ACTOS) 30 MG tablet Take 30 mg by mouth daily.       . ramipril (ALTACE) 2.5 MG capsule Take 2.5 mg by mouth daily.      . Tamsulosin HCl (FLOMAX) 0.4 MG CAPS Take by mouth daily after supper.      . triamterene-hydrochlorothiazide (DYAZIDE) 37.5-25 MG per capsule Take 1 capsule by mouth daily. 1/4 tab daily.       No current facility-administered medications on file prior to visit.    No Known Allergies  Past Medical History  Diagnosis Date  . Coronary artery disease   . Hyperlipidemia   . Hypertension   . Diabetes mellitus   . BPH (benign prostatic hypertrophy)   . TIA (transient ischemic attack)     Approximately 6 weeks post-cardiac  catheterization.   . MI (myocardial infarction)     Past Surgical History  Procedure Laterality Date  . Coronary angioplasty with stent placement  10/2004    stenting x 2 to RCA  . Hernia repair    . Cataract extraction, bilateral    . Cardiovascular stress test  07/01/2007    EF 74%  . Hip arthroplasty  03/09/2011    Procedure: ARTHROPLASTY BIPOLAR HIP;  Surgeon: Mauri Pole;  Location: [facility];  Service: Orthopedics;  Laterality: Left;    History  Smoking status  . Former Smoker  . Quit date: 03/06/1971  Smokeless tobacco  . Never Used    History  Alcohol Use  . 6 - 8.4 oz/week  . 10-14 Glasses of wine per week    Family History  Problem Relation Age of Onset  . Lung cancer Father   . Lung cancer Brother   . Hypertension Mother     Reviw of Systems:  Reviewed in the HPI.  All other systems are negative.  Physical Exam: Blood pressure 130/76, pulse 58, height 5\' 7"  (1.702 m), weight 197 lb 6.4 oz (89.54 kg). General: Well developed, well  nourished, in no acute distress.  Head: Normocephalic, atraumatic, sclera non-icteric, mucus membranes are moist,   Neck: Supple. Negative for carotid bruits. JVD not elevated.  Lungs: Clear bilaterally to auscultation without wheezes, rales, or rhonchi. Breathing is unlabored.  Heart: RRR with S1 S2. No murmurs, rubs, or gallops appreciated.  Abdomen: Soft, non-tender, non-distended with normoactive bowel sounds. No hepatomegaly. No rebound/guarding. No obvious abdominal masses.  Msk:  Strength and tone appear normal for age.  Extremities: No clubbing or cyanosis. No edema.  Distal pedal pulses are 2+ and equal bilaterally.  Neuro: Alert and oriented X 3. Moves all extremities spontaneously.  Psych:  Responds to questions appropriately with a normal affect.  ECG:   Assessment / Plan:

## 2013-04-03 NOTE — Patient Instructions (Signed)
Your physician wants you to follow-up in: 6 months  You will receive a reminder letter in the mail two months in advance. If you don't receive a letter, please call our office to schedule the follow-up appointment.  Your physician recommends that you continue on your current medications as directed. Please refer to the Current Medication list given to you today.  

## 2013-04-07 NOTE — Assessment & Plan Note (Signed)
Continue current meds 

## 2013-04-07 NOTE — Assessment & Plan Note (Signed)
Mr. Stoudt is doing well.  He has not had any angina.  Continue current meds.

## 2013-04-16 ENCOUNTER — Encounter: Payer: Self-pay | Admitting: Cardiovascular Disease

## 2013-04-16 DIAGNOSIS — I10 Essential (primary) hypertension: Secondary | ICD-10-CM | POA: Diagnosis not present

## 2013-04-16 DIAGNOSIS — M810 Age-related osteoporosis without current pathological fracture: Secondary | ICD-10-CM | POA: Diagnosis not present

## 2013-04-16 DIAGNOSIS — E1129 Type 2 diabetes mellitus with other diabetic kidney complication: Secondary | ICD-10-CM | POA: Diagnosis not present

## 2013-04-17 ENCOUNTER — Other Ambulatory Visit: Payer: Self-pay | Admitting: Cardiovascular Disease

## 2013-04-20 DIAGNOSIS — M2420 Disorder of ligament, unspecified site: Secondary | ICD-10-CM | POA: Diagnosis not present

## 2013-04-20 DIAGNOSIS — H02139 Senile ectropion of unspecified eye, unspecified eyelid: Secondary | ICD-10-CM | POA: Diagnosis not present

## 2013-04-20 DIAGNOSIS — C44111 Basal cell carcinoma of skin of unspecified eyelid, including canthus: Secondary | ICD-10-CM | POA: Diagnosis not present

## 2013-04-20 DIAGNOSIS — H11439 Conjunctival hyperemia, unspecified eye: Secondary | ICD-10-CM | POA: Diagnosis not present

## 2013-04-27 ENCOUNTER — Other Ambulatory Visit: Payer: Self-pay | Admitting: Cardiovascular Disease

## 2013-05-04 DIAGNOSIS — N183 Chronic kidney disease, stage 3 unspecified: Secondary | ICD-10-CM | POA: Diagnosis not present

## 2013-05-04 DIAGNOSIS — E785 Hyperlipidemia, unspecified: Secondary | ICD-10-CM | POA: Diagnosis not present

## 2013-05-04 DIAGNOSIS — E1129 Type 2 diabetes mellitus with other diabetic kidney complication: Secondary | ICD-10-CM | POA: Diagnosis not present

## 2013-05-04 DIAGNOSIS — I10 Essential (primary) hypertension: Secondary | ICD-10-CM | POA: Diagnosis not present

## 2013-05-28 DIAGNOSIS — L723 Sebaceous cyst: Secondary | ICD-10-CM | POA: Diagnosis not present

## 2013-05-28 DIAGNOSIS — L919 Hypertrophic disorder of the skin, unspecified: Secondary | ICD-10-CM | POA: Diagnosis not present

## 2013-05-28 DIAGNOSIS — L570 Actinic keratosis: Secondary | ICD-10-CM | POA: Diagnosis not present

## 2013-05-28 DIAGNOSIS — L909 Atrophic disorder of skin, unspecified: Secondary | ICD-10-CM | POA: Diagnosis not present

## 2013-05-28 DIAGNOSIS — Z85828 Personal history of other malignant neoplasm of skin: Secondary | ICD-10-CM | POA: Diagnosis not present

## 2013-05-28 DIAGNOSIS — L821 Other seborrheic keratosis: Secondary | ICD-10-CM | POA: Diagnosis not present

## 2013-05-29 ENCOUNTER — Telehealth: Payer: Self-pay | Admitting: Cardiovascular Disease

## 2013-05-29 NOTE — Telephone Encounter (Signed)
Wife calling wanting to know if we received clearance form from Dr. Brigitte Pulse for eye surgery.  Advised that Dr. Elmarie Shiley nurse not in office today and will forward message to her.

## 2013-05-29 NOTE — Telephone Encounter (Signed)
New Message:  Pt states Dr. Raul Del office sent over a clearance form and the pt would like a call back to know if our office received it or not.

## 2013-06-02 ENCOUNTER — Ambulatory Visit (INDEPENDENT_AMBULATORY_CARE_PROVIDER_SITE_OTHER): Payer: Medicare Other | Admitting: Physician Assistant

## 2013-06-02 ENCOUNTER — Encounter: Payer: Self-pay | Admitting: Physician Assistant

## 2013-06-02 VITALS — BP 128/70 | HR 68 | Ht 67.0 in | Wt 195.0 lb

## 2013-06-02 DIAGNOSIS — I10 Essential (primary) hypertension: Secondary | ICD-10-CM

## 2013-06-02 DIAGNOSIS — Z01810 Encounter for preprocedural cardiovascular examination: Secondary | ICD-10-CM

## 2013-06-02 DIAGNOSIS — E785 Hyperlipidemia, unspecified: Secondary | ICD-10-CM | POA: Diagnosis not present

## 2013-06-02 DIAGNOSIS — I2510 Atherosclerotic heart disease of native coronary artery without angina pectoris: Secondary | ICD-10-CM

## 2013-06-02 NOTE — Telephone Encounter (Signed)
msg left/ have not seen request yet.

## 2013-06-02 NOTE — Patient Instructions (Signed)
NO CHANGES WERE MADE TODAY WITH YOUR MEDICATIONS   WE WILL SEND OUT A REMINDER LETTER FOR YOU TO SEE DR. NAHSER SOMETIME IN 09/2013

## 2013-06-02 NOTE — Progress Notes (Signed)
[name], Herbert Marquez, [place]  [address] Phone: [phone] Fax:  [phone]  Date:  06/02/2013   ID:  Marquez Herbert, DOB 24-Jan-1928, MRN [id number]  PCP:  Thressa Sheller, MD  Cardiologist:  Dr. Liam Rogers     History of Present Illness: Herbert Marquez is a 80 y.o. male with a hx CAD, s/p MI in 1998 treated with PCI and stenting to the [facility] in [place], [place]), HTN, HL, DM.  Last seen by Dr. Acie Fredrickson 04/03/13.  He needs a lesion removed from his L eyelid.  There is a concern this may be basal cell CA.  The surgeon is Jola Schmidt, MD in [place].  I have no records but the patient tells me that he will have conscious sedation only.  He needs to be off ASA for 7 days.  He denies chest pain. He continues to go to the [facility] 2-3 days a week.  He uses the stepper, stationery bike and hand bike for a total of 45 minutes.  He also walks up 2 flights of steps without problems.  Denies dyspnea.  No syncope.  No orthopnea, PND, edema.     Studies:  - Echo (03/15/05):  EF 50%, mild HK of the inferior wall, mild to moderate LVH, moderate LAE  - Nuclear (07/01/07):  Apical inf MI with minimal peri-infarct ischemia, EF 74%; Low Risk    Recent Labs: No results found for requested labs within last 365 days. 04/16/2013:  Mg 2, Hgb 12.1, BUN 24, Creatinine 1.7, K 4.7, ALT 22, TSH 1.860  Wt Readings from Last 3 Encounters:  06/02/13 195 lb (88.451 kg)  04/03/13 197 lb 6.4 oz (89.54 kg)  10/02/12 198 lb 3.2 oz (89.903 kg)     Past Medical History  Diagnosis Date  . Coronary artery disease   . Hyperlipidemia   . Hypertension   . Diabetes mellitus   . BPH (benign prostatic hypertrophy)   . TIA (transient ischemic attack)     Approximately 6 weeks post-cardiac catheterization.   . MI (myocardial infarction)     Current Outpatient Prescriptions  Medication Sig Dispense Refill  . aspirin 81 MG tablet Take 81 mg by mouth daily.       [name] atorvastatin (LIPITOR) 40 MG tablet Take  1 tablet (40 mg total) by mouth daily.  90 tablet  1  . diphenhydrAMINE (BENADRYL) 25 MG tablet Take 25 mg by mouth every 6 (six) hours as needed. Rhinorrhea      . metoprolol succinate (TOPROL-XL) 100 MG 24 hr tablet TAKE ONE TABLET BY MOUTH ONCE DAILY WITH MEALS  90 tablet  0  . Multiple Vitamin (MULTIVITAMIN) tablet Take 1 tablet by mouth daily.       [name] NITROSTAT 0.4 MG SL tablet DISSOLVE ONE TABLET UNDER THE TONGUE EVERY 5 MINUTES AS NEEDED FOR CHEST PAIN.  DO NOT EXCEED A TOTAL OF 3 DOSES IN 15 MINUTES  25 tablet  0  . omeprazole (PRILOSEC) 20 MG capsule Take 20 mg by mouth daily.       . pioglitazone (ACTOS) 30 MG tablet Take 30 mg by mouth daily.       . ramipril (ALTACE) 2.5 MG capsule Take 2.5 mg by mouth daily.      . Tamsulosin HCl (FLOMAX) 0.4 MG CAPS Take by mouth daily after supper.      . triamterene-hydrochlorothiazide (DYAZIDE) 37.5-25 MG per capsule Take 1 capsule by mouth daily. 1/4 tab daily.  No current facility-administered medications for this visit.    Allergies:   Review of patient's allergies indicates no known allergies.   Social History:  The patient  reports that he quit smoking about 42 years ago. He has never used smokeless tobacco. He reports that he drinks about 6.0 ounces of alcohol per week. He reports that he does not use illicit drugs.   Family History:  The patient's family history includes Hypertension in his mother; Lung cancer in his brother and father.   ROS:  Please see the history of present illness.      All other systems reviewed and negative.   PHYSICAL EXAM: VS:  BP 128/70  Pulse 68  Ht 5\' 7"  (1.702 m)  Wt 195 lb (88.451 kg)  BMI 30.53 kg/m2 Well nourished, well developed, in no acute distress HEENT: normal Neck: no JVD Cardiac:  normal S1, S2; RRR; no murmur Lungs:  clear to auscultation bilaterally, no wheezing, rhonchi or rales Abd: soft, nontender, no hepatomegaly Ext: trace to 1+ bilateral LE edema Skin: warm and dry Neuro:   CNs 2-12 intact, no focal abnormalities noted  EKG:  NSR, HR 68, LAD, IVCD, NSSTTW changes, PVCs     ASSESSMENT AND PLAN:  1. Surgical Clearance:  The patient does not have any unstable cardiac conditions.  He remains active and is able to exercise without anginal symptoms.  He is certainly able to achieve 4 METs.  His surgery will be done with conscious sedation which puts him a low risk category. It has been 7 years since his PCI.  We would prefer he remain on ASA, but if his bleeding risk is too great, it is acceptable to hold it for 5-7 days, then resume it post op as soon as possible.  He should remain on beta blocker throughout to reduce the risks of CV complications.   2. CAD:  No angina.  Continue ASA, statin, beta blocker. 3. Hypertension:  Controlled. 4. Hyperlipidemia:  Continue statin. 5. PVCs:  He is asymptomatic. 6. Disposition:  F/u with Dr. Liam Rogers as planned.   Signed, Richardson Dopp, PA-C  06/02/2013 3:13 PM

## 2013-06-03 NOTE — Telephone Encounter (Signed)
Per Dr Acie Fredrickson, pt is at low risk for up coming eye procedure. Pt may hold aspirin if needed, for 5-7 days. Phone [phone] Fax [phone] Taken to MR to fax

## 2013-06-05 ENCOUNTER — Ambulatory Visit: Payer: Medicare Other | Admitting: Cardiovascular Disease

## 2013-06-12 DIAGNOSIS — C44101 Unspecified malignant neoplasm of skin of unspecified eyelid, including canthus: Secondary | ICD-10-CM | POA: Diagnosis not present

## 2013-06-12 DIAGNOSIS — Z87891 Personal history of nicotine dependence: Secondary | ICD-10-CM | POA: Diagnosis not present

## 2013-06-12 DIAGNOSIS — I252 Old myocardial infarction: Secondary | ICD-10-CM | POA: Diagnosis not present

## 2013-06-12 DIAGNOSIS — C44111 Basal cell carcinoma of skin of unspecified eyelid, including canthus: Secondary | ICD-10-CM | POA: Diagnosis not present

## 2013-06-12 DIAGNOSIS — H16219 Exposure keratoconjunctivitis, unspecified eye: Secondary | ICD-10-CM | POA: Diagnosis not present

## 2013-06-12 DIAGNOSIS — M2420 Disorder of ligament, unspecified site: Secondary | ICD-10-CM | POA: Diagnosis not present

## 2013-06-12 DIAGNOSIS — Z85828 Personal history of other malignant neoplasm of skin: Secondary | ICD-10-CM | POA: Diagnosis not present

## 2013-06-12 DIAGNOSIS — H02139 Senile ectropion of unspecified eye, unspecified eyelid: Secondary | ICD-10-CM | POA: Diagnosis not present

## 2013-06-12 DIAGNOSIS — E119 Type 2 diabetes mellitus without complications: Secondary | ICD-10-CM | POA: Diagnosis not present

## 2013-06-12 DIAGNOSIS — I10 Essential (primary) hypertension: Secondary | ICD-10-CM | POA: Diagnosis not present

## 2013-06-12 DIAGNOSIS — Z8673 Personal history of transient ischemic attack (TIA), and cerebral infarction without residual deficits: Secondary | ICD-10-CM | POA: Diagnosis not present

## 2013-06-12 DIAGNOSIS — I2510 Atherosclerotic heart disease of native coronary artery without angina pectoris: Secondary | ICD-10-CM | POA: Diagnosis not present

## 2013-06-12 DIAGNOSIS — D3160 Benign neoplasm of unspecified site of unspecified orbit: Secondary | ICD-10-CM | POA: Diagnosis not present

## 2013-06-12 DIAGNOSIS — Z7982 Long term (current) use of aspirin: Secondary | ICD-10-CM | POA: Diagnosis not present

## 2013-07-05 ENCOUNTER — Other Ambulatory Visit: Payer: Self-pay | Admitting: Cardiovascular Disease

## 2013-08-19 DIAGNOSIS — R300 Dysuria: Secondary | ICD-10-CM | POA: Diagnosis not present

## 2013-08-19 DIAGNOSIS — N401 Enlarged prostate with lower urinary tract symptoms: Secondary | ICD-10-CM | POA: Diagnosis not present

## 2013-09-28 ENCOUNTER — Other Ambulatory Visit: Payer: Self-pay | Admitting: Cardiovascular Disease

## 2013-10-19 ENCOUNTER — Encounter: Payer: Self-pay | Admitting: Cardiovascular Disease

## 2013-10-19 ENCOUNTER — Ambulatory Visit (INDEPENDENT_AMBULATORY_CARE_PROVIDER_SITE_OTHER): Payer: Medicare Other | Admitting: Cardiovascular Disease

## 2013-10-19 VITALS — BP 142/64 | HR 53 | Ht 67.0 in | Wt 195.8 lb

## 2013-10-19 DIAGNOSIS — I2510 Atherosclerotic heart disease of native coronary artery without angina pectoris: Secondary | ICD-10-CM | POA: Diagnosis not present

## 2013-10-19 NOTE — Patient Instructions (Signed)
Your physician recommends that you continue on your current medications as directed. Please refer to the Current Medication list given to you today.  Your physician wants you to follow-up in: 6 months with Dr. Nahser.  You will receive a reminder letter in the mail two months in advance. If you don't receive a letter, please call our office to schedule the follow-up appointment.  

## 2013-10-19 NOTE — Progress Notes (Signed)
Herbert Marquez Date of Birth  17-Mar-1927 [place]  [address]    [place]   Herbert Marquez, Herbert Marquez  [address]    [place],   [address] [phone]  Fax  [phone]  [phone]  Fax [phone]  Problem list: 1. Coronary artery disease-status post PTCA and stenting of the RCA in [place], [place] 2. Dyslipidemia 3. Hypertension 4. Diabetes mellitus 5. Hernia repair   History of Present Illness:  Pt has done well from a cardiac standpoint.  He fell and broke his left hip in January .  His is recovering well.  He is still walking with a cane.  He had no complications during or after surgery.  He had a severe UTI in June / July of 2013.  He has been seen by Urology and was started on Tamulosin for BPH.   He's not had any episodes of chest pain or shortness breath. He still walks with a cane but is able to get out and stays fairly active.  October 02, 2012:  Herbert Marquez is doing well.  No CP , no dyspnea.  He remains active - went to the beach last week.  He walks at [facility] when he is down there.  Goes to the [facility] 3 times a week.  No angina.   Jan. 30, 2015:  Herbert Marquez is doing well.  He has not had any cardiac problems.  He has been to the beach recently.   He goes to the [facility] 2-3 times a week. He doesn't have any angina.    His primary medical doctor is managing his lipids.  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  Has done well since that time.   Still goes to the [facility] 3 times a week. He's not having episodes of chest pain. He informed me that he will eventually need to have a right knee replacement. At this point he would be at low risk for any cardiac complications.  Current Outpatient Prescriptions on File Prior to Visit  Medication Sig Dispense Refill  . aspirin 81 MG tablet Take 81 mg by mouth daily.       [name] atorvastatin (LIPITOR) 40 MG tablet Take 1 tablet (40  mg total) by mouth daily.  90 tablet  1  . diphenhydrAMINE (BENADRYL) 25 MG tablet Take 25 mg by mouth every 6 (six) hours as needed. Rhinorrhea      . metoprolol succinate (TOPROL-XL) 100 MG 24 hr tablet TAKE ONE TABLET BY MOUTH ONCE DAILY WITH MEALS  90 tablet  0  . Multiple Vitamin (MULTIVITAMIN) tablet Take 1 tablet by mouth daily.       [name] NITROSTAT 0.4 MG SL tablet DISSOLVE ONE TABLET UNDER THE TONGUE EVERY 5 MINUTES AS NEEDED FOR CHEST PAIN.  DO NOT EXCEED A TOTAL OF 3 DOSES IN 15 MINUTES  25 tablet  0  . omeprazole (PRILOSEC) 20 MG capsule Take 20 mg by mouth daily.       . pioglitazone (ACTOS) 30 MG tablet Take 30 mg by mouth daily.       . ramipril (ALTACE) 2.5 MG capsule Take 2.5 mg by mouth daily.      . Tamsulosin HCl (FLOMAX) 0.4 MG CAPS Take by mouth daily after supper.      . triamterene-hydrochlorothiazide (DYAZIDE) 37.5-25 MG per capsule Take 1 capsule by mouth daily. 1/4 tab daily.  No current facility-administered medications on file prior to visit.    No Known Allergies  Past Medical History  Diagnosis Date  . Coronary artery disease   . Hyperlipidemia   . Hypertension   . Diabetes mellitus   . BPH (benign prostatic hypertrophy)   . TIA (transient ischemic attack)     Approximately 6 weeks post-cardiac catheterization.   . MI (myocardial infarction)     Past Surgical History  Procedure Laterality Date  . Coronary angioplasty with stent placement  10/2004    stenting x 2 to RCA  . Hernia repair    . Cataract extraction, bilateral    . Cardiovascular stress test  07/01/2007    EF 74%  . Hip arthroplasty  03/09/2011    Procedure: ARTHROPLASTY BIPOLAR HIP;  Surgeon: Mauri Pole;  Location: [facility];  Service: Orthopedics;  Laterality: Left;    History  Smoking status  . Former Smoker  . Quit date: 03/06/1971  Smokeless tobacco  . Never Used    History  Alcohol Use  . 6.0 - 8.4 oz/week  . 10-14 Glasses of wine per week    Family History    Problem Relation Age of Onset  . Lung cancer Father   . Lung cancer Brother   . Hypertension Mother     Reviw of Systems:  Reviewed in the HPI.  All other systems are negative.  Physical Exam: Blood pressure 142/64, pulse 53, height 5\' 7"  (1.702 m), weight 195 lb 12.8 oz (88.814 kg), SpO2 99.00%. General: Well developed, well nourished, in no acute distress.  Head: Normocephalic, atraumatic, sclera non-icteric, mucus membranes are moist,   Neck: Supple. Negative for carotid bruits. JVD not elevated.  Lungs: Clear bilaterally to auscultation without wheezes, rales, or rhonchi. Breathing is unlabored.  Heart: RRR with S1 S2. No murmurs, rubs, or gallops appreciated.  Abdomen: Soft, non-tender, non-distended with normoactive bowel sounds. No hepatomegaly. No rebound/guarding. No obvious abdominal masses.  Msk:  Strength and tone appear normal for age.  Extremities: No clubbing or cyanosis. No edema.  Distal pedal pulses are 2+ and equal bilaterally.  Neuro: Alert and oriented X 3. Moves all extremities spontaneously.  Psych:  Responds to questions appropriately with a normal affect.  ECG:   Assessment / Plan:

## 2013-10-19 NOTE — Assessment & Plan Note (Signed)
Herbert Marquez is doing well. He's not having any episodes of chest pain or shortness breath. He still goes to the [facility] on a regular basis and does not have any complications. He does note that he slowed down a little bit since since he is 80 years old.  He did well with his eye surgery.  He will probably need about right knee replacement at some point in the future. From my standpoint he would be at low risk for having any cardiac complications.  Continue with his same medications. He'll have his lipids checked by his medical doctor and we will forward the results to [place]. I will see him  again in 6 months.

## 2013-11-13 DIAGNOSIS — I10 Essential (primary) hypertension: Secondary | ICD-10-CM | POA: Diagnosis not present

## 2013-11-13 DIAGNOSIS — Z1331 Encounter for screening for depression: Secondary | ICD-10-CM | POA: Diagnosis not present

## 2013-11-13 DIAGNOSIS — Z0000 Encounter for general adult medical examination without abnormal findings: Secondary | ICD-10-CM | POA: Diagnosis not present

## 2013-11-13 DIAGNOSIS — E1129 Type 2 diabetes mellitus with other diabetic kidney complication: Secondary | ICD-10-CM | POA: Diagnosis not present

## 2013-11-13 DIAGNOSIS — Z125 Encounter for screening for malignant neoplasm of prostate: Secondary | ICD-10-CM | POA: Diagnosis not present

## 2013-11-13 DIAGNOSIS — M810 Age-related osteoporosis without current pathological fracture: Secondary | ICD-10-CM | POA: Diagnosis not present

## 2013-11-13 DIAGNOSIS — E669 Obesity, unspecified: Secondary | ICD-10-CM | POA: Diagnosis not present

## 2013-11-16 DIAGNOSIS — N139 Obstructive and reflux uropathy, unspecified: Secondary | ICD-10-CM | POA: Diagnosis not present

## 2013-11-16 DIAGNOSIS — N401 Enlarged prostate with lower urinary tract symptoms: Secondary | ICD-10-CM | POA: Diagnosis not present

## 2013-11-23 DIAGNOSIS — E1129 Type 2 diabetes mellitus with other diabetic kidney complication: Secondary | ICD-10-CM | POA: Diagnosis not present

## 2013-11-23 DIAGNOSIS — I10 Essential (primary) hypertension: Secondary | ICD-10-CM | POA: Diagnosis not present

## 2013-11-23 DIAGNOSIS — I2510 Atherosclerotic heart disease of native coronary artery without angina pectoris: Secondary | ICD-10-CM | POA: Diagnosis not present

## 2013-11-23 DIAGNOSIS — E785 Hyperlipidemia, unspecified: Secondary | ICD-10-CM | POA: Diagnosis not present

## 2013-11-26 DIAGNOSIS — Z23 Encounter for immunization: Secondary | ICD-10-CM | POA: Diagnosis not present

## 2014-01-14 ENCOUNTER — Other Ambulatory Visit: Payer: Self-pay | Admitting: Cardiovascular Disease

## 2014-01-22 ENCOUNTER — Other Ambulatory Visit: Payer: Self-pay | Admitting: Cardiovascular Disease

## 2014-02-12 DIAGNOSIS — E119 Type 2 diabetes mellitus without complications: Secondary | ICD-10-CM | POA: Diagnosis not present

## 2014-02-12 DIAGNOSIS — H43813 Vitreous degeneration, bilateral: Secondary | ICD-10-CM | POA: Diagnosis not present

## 2014-02-12 DIAGNOSIS — H01001 Unspecified blepharitis right upper eyelid: Secondary | ICD-10-CM | POA: Diagnosis not present

## 2014-02-12 DIAGNOSIS — H01004 Unspecified blepharitis left upper eyelid: Secondary | ICD-10-CM | POA: Diagnosis not present

## 2014-04-22 ENCOUNTER — Ambulatory Visit (INDEPENDENT_AMBULATORY_CARE_PROVIDER_SITE_OTHER): Payer: Medicare Other | Admitting: Cardiovascular Disease

## 2014-04-22 ENCOUNTER — Encounter: Payer: Self-pay | Admitting: Cardiovascular Disease

## 2014-04-22 VITALS — BP 146/80 | HR 54 | Ht 67.0 in | Wt 196.2 lb

## 2014-04-22 DIAGNOSIS — I10 Essential (primary) hypertension: Secondary | ICD-10-CM

## 2014-04-22 DIAGNOSIS — I2510 Atherosclerotic heart disease of native coronary artery without angina pectoris: Secondary | ICD-10-CM

## 2014-04-22 DIAGNOSIS — E785 Hyperlipidemia, unspecified: Secondary | ICD-10-CM | POA: Diagnosis not present

## 2014-04-22 LAB — LIPID PANEL
Cholesterol: 114 mg/dL (ref 0–200)
HDL: 36.2 mg/dL — ABNORMAL LOW (ref >39.00)
LDL Cholesterol: 60 mg/dL (ref 0–99)
NonHDL: 77.8
Total CHOL/HDL Ratio: 3
Triglycerides: 91 mg/dL (ref 0.0–149.0)
VLDL: 18.2 mg/dL (ref 0.0–40.0)

## 2014-04-22 LAB — BASIC METABOLIC PANEL
BUN: 23 mg/dL (ref 6–23)
CO2: 29 mEq/L (ref 19–32)
Calcium: 9.3 mg/dL (ref 8.4–10.5)
Chloride: 98 mEq/L (ref 96–112)
Creatinine, Ser: 1.6 mg/dL — ABNORMAL HIGH (ref 0.40–1.50)
GFR: 43.92 mL/min — ABNORMAL LOW (ref >60.00)
Glucose, Bld: 115 mg/dL — ABNORMAL HIGH (ref 70–99)
Potassium: 4.5 mEq/L (ref 3.5–5.1)
Sodium: 131 mEq/L — ABNORMAL LOW (ref 135–145)

## 2014-04-22 LAB — HEPATIC FUNCTION PANEL
ALT: 12 U/L (ref 0–53)
AST: 25 U/L (ref 0–37)
Albumin: 3.8 g/dL (ref 3.5–5.2)
Alkaline Phosphatase: 69 U/L (ref 39–117)
Bilirubin, Direct: 0.1 mg/dL (ref 0.0–0.3)
Total Bilirubin: 0.5 mg/dL (ref 0.2–1.2)
Total Protein: 6.8 g/dL (ref 6.0–8.3)

## 2014-04-22 NOTE — Patient Instructions (Signed)
Your physician recommends that you have lab work: TODAY - fasting cholesterol, liver, basic metabolic panel  Your physician recommends that you continue on your current medications as directed. Please refer to the Current Medication list given to you today.  Your physician wants you to follow-up in: 6 months with Dr. Acie Fredrickson.  You will receive a reminder letter in the mail two months in advance. If you don't receive a letter, please call our office to schedule the follow-up appointment.

## 2014-04-22 NOTE — Progress Notes (Signed)
Cardiology Office Note   Date:  04/22/2014   ID:  Tomi Bamberger, DOB 04-25-1927, MRN [id number]  PCP:  Thressa Sheller, MD  Cardiologist:   [place] Headings, MD   Chief Complaint  Patient presents with  . Coronary Artery Disease   Problem list:  1. Coronary artery disease-status post PTCA and stenting of the RCA in [place], [place]  2. Dyslipidemia  3. Hypertension  4. Diabetes mellitus  5. Hernia repair   History of Present Illness:  Pt has done well from a cardiac standpoint. He fell and broke his left hip in January . His is recovering well. He is still walking with a cane. He had no complications during or after surgery.  He had a severe UTI in June / July of 2013. He has been seen by Urology and was started on Tamulosin for BPH.  He's not had any episodes of chest pain or shortness breath. He still walks with a cane but is able to get out and stays fairly active.  October 02, 2012:  Mr. Sermons is doing well. No CP , no dyspnea. He remains active - went to the beach last week. He walks at [facility] when he is down there. Goes to the [facility] 3 times a week. No angina.  Jan. 30, 2015:  Mr. Weatherall is doing well. He has not had any cardiac problems. He has been to the beach recently.  He goes to the [facility] 2-3 times a week. He doesn't have any angina. His primary medical doctor is managing his lipids.    Feb. 18, 2016:   CAYLEB JARNIGAN is a 81 y.o. male who presents for follow up of his CAD. NO CP or dyspnea.    Limited by leg and back pains.  He and his wife celebrated their 59th wedding anniversary.   They went to [facility] last week.      Past Medical History  Diagnosis Date  . Coronary artery disease   . Hyperlipidemia   . Hypertension   . Diabetes mellitus   . BPH (benign prostatic hypertrophy)   . TIA (transient ischemic attack)     Approximately 6 weeks post-cardiac catheterization.   . MI (myocardial infarction)     Past Surgical History  Procedure  Laterality Date  . Coronary angioplasty with stent placement  10/2004    stenting x 2 to RCA  . Hernia repair    . Cataract extraction, bilateral    . Cardiovascular stress test  07/01/2007    EF 74%  . Hip arthroplasty  03/09/2011    Procedure: ARTHROPLASTY BIPOLAR HIP;  Surgeon: Mauri Pole;  Location: [facility];  Service: Orthopedics;  Laterality: Left;     Current Outpatient Prescriptions  Medication Sig Dispense Refill  . aspirin 81 MG tablet Take 81 mg by mouth daily.     . diphenhydrAMINE (BENADRYL) 25 MG tablet Take 25 mg by mouth every 6 (six) hours as needed. Rhinorrhea    . Multiple Vitamin (MULTIVITAMIN) tablet Take 1 tablet by mouth daily.     [name] omeprazole (PRILOSEC) 20 MG capsule Take 20 mg by mouth daily.     . pioglitazone (ACTOS) 30 MG tablet Take 30 mg by mouth daily.     . ramipril (ALTACE) 2.5 MG capsule Take 2.5 mg by mouth daily.    . Tamsulosin HCl (FLOMAX) 0.4 MG CAPS Take by mouth daily after supper.    . triamterene-hydrochlorothiazide (DYAZIDE) 37.5-25 MG per capsule Take 1  capsule by mouth daily. 1/4 tab daily.    [name] atorvastatin (LIPITOR) 40 MG tablet TAKE ONE TABLET BY MOUTH ONCE DAILY 90 tablet 1  . metoprolol succinate (TOPROL-XL) 100 MG 24 hr tablet TAKE ONE TABLET BY MOUTH ONCE DAILY WITH MEALS 90 tablet 1  . NITROSTAT 0.4 MG SL tablet DISSOLVE ONE TABLET UNDER THE TONGUE EVERY 5 MINUTES AS NEEDED FOR CHEST PAIN.  DO NOT EXCEED A TOTAL OF 3 DOSES IN 15 MINUTES 25 tablet 0   No current facility-administered medications for this visit.    Allergies:   Review of patient's allergies indicates no known allergies.    Social History:  The patient  reports that he quit smoking about 43 years ago. He has never used smokeless tobacco. He reports that he drinks about 6.0 - 8.4 oz of alcohol per week. He reports that he does not use illicit drugs.   Family History:  The patient's family history includes Hypertension in his mother; Lung cancer in his brother and  father.    ROS:  Please see the history of present illness.    Review of Systems: Constitutional:  denies fever, chills, diaphoresis, appetite change and fatigue.  HEENT: denies photophobia, eye pain, redness, hearing loss, ear pain, congestion, sore throat, rhinorrhea, sneezing, neck pain, neck stiffness and tinnitus.  Respiratory: denies SOB, DOE, cough, chest tightness, and wheezing.  Cardiovascular: denies chest pain, palpitations and leg swelling.  Gastrointestinal: denies nausea, vomiting, abdominal pain, diarrhea, constipation, blood in stool.  Genitourinary: denies dysuria, urgency, frequency, hematuria, flank pain and difficulty urinating.  Musculoskeletal: denies  myalgias, back pain, joint swelling, arthralgias and gait problem.   Skin: denies pallor, rash and wound.  Neurological: denies dizziness, seizures, syncope, weakness, light-headedness, numbness and headaches.   Hematological: denies adenopathy, easy bruising, personal or family bleeding history.  Psychiatric/ Behavioral: denies suicidal ideation, mood changes, confusion, nervousness, sleep disturbance and agitation.       All other systems are reviewed and negative.    PHYSICAL EXAM: VS:  There were no vitals taken for this visit. , BMI There is no weight on file to calculate BMI. GEN: Well nourished, well developed, in no acute distress HEENT: normal Neck: no JVD, carotid bruits, or masses Cardiac: RRR; no murmurs, rubs, or gallops,no edema  Respiratory:  clear to auscultation bilaterally, normal work of breathing GI: soft, nontender, nondistended, + BS MS: no deformity or atrophy Skin: warm and dry, no rash Neuro:  Strength and sensation are intact Psych: normal   EKG:  EKG is ordered today. The ekg ordered today demonstrates  Sinus brady with 1st degree AV block , LVH with QRS widening    Recent Labs: No results found for requested labs within last 365 days.    Lipid Panel    Component Value  Date/Time   CHOL 124 04/12/2011 0949   TRIG 74.0 04/12/2011 0949   HDL 41.30 04/12/2011 0949   CHOLHDL 3 04/12/2011 0949   VLDL 14.8 04/12/2011 0949   LDLCALC 68 04/12/2011 0949      Wt Readings from Last 3 Encounters:  10/19/13 195 lb 12.8 oz (88.814 kg)  06/02/13 195 lb (88.451 kg)  04/03/13 197 lb 6.4 oz (89.54 kg)      Other studies Reviewed: Additional studies/ records that were reviewed today include: . Review of the above records demonstrates:    ASSESSMENT AND PLAN:  1. Coronary artery disease-status post PTCA and stenting of the RCA in [place], [place]  - he is  doing well.  No angina.   2. Dyslipidemia - will draw fasting lipids today. Continue current dose of atorvastatin 40 g a day  3. Hypertension  - his blood pressures based well-controlled. It is a little elevated today. I've encouraged him to work on a good exercise weight loss. Continue same medications.  4. Diabetes mellitus  - followed by his general medical doctor.  5. Hernia repair    Current medicines are reviewed at length with the patient today.  The patient does not have concerns regarding medicines.  The following changes have been made:  no change   Disposition:   FU with me in 6 months    Signed, , Wonda Cheng, MD  04/22/2014 8:15 AM    [facility] [place], [place], [place]  [address] Phone: [phone]; Fax: [phone]

## 2014-05-17 DIAGNOSIS — I10 Essential (primary) hypertension: Secondary | ICD-10-CM | POA: Diagnosis not present

## 2014-05-17 DIAGNOSIS — E785 Hyperlipidemia, unspecified: Secondary | ICD-10-CM | POA: Diagnosis not present

## 2014-05-17 DIAGNOSIS — E1329 Other specified diabetes mellitus with other diabetic kidney complication: Secondary | ICD-10-CM | POA: Diagnosis not present

## 2014-05-17 DIAGNOSIS — M810 Age-related osteoporosis without current pathological fracture: Secondary | ICD-10-CM | POA: Diagnosis not present

## 2014-05-24 DIAGNOSIS — E1122 Type 2 diabetes mellitus with diabetic chronic kidney disease: Secondary | ICD-10-CM | POA: Diagnosis not present

## 2014-05-24 DIAGNOSIS — E785 Hyperlipidemia, unspecified: Secondary | ICD-10-CM | POA: Diagnosis not present

## 2014-05-24 DIAGNOSIS — I10 Essential (primary) hypertension: Secondary | ICD-10-CM | POA: Diagnosis not present

## 2014-05-24 DIAGNOSIS — I2510 Atherosclerotic heart disease of native coronary artery without angina pectoris: Secondary | ICD-10-CM | POA: Diagnosis not present

## 2014-05-27 ENCOUNTER — Other Ambulatory Visit: Payer: Self-pay | Admitting: Dermatology

## 2014-05-27 DIAGNOSIS — D0461 Carcinoma in situ of skin of right upper limb, including shoulder: Secondary | ICD-10-CM | POA: Diagnosis not present

## 2014-05-27 DIAGNOSIS — C4441 Basal cell carcinoma of skin of scalp and neck: Secondary | ICD-10-CM | POA: Diagnosis not present

## 2014-05-27 DIAGNOSIS — Z85828 Personal history of other malignant neoplasm of skin: Secondary | ICD-10-CM | POA: Diagnosis not present

## 2014-05-27 DIAGNOSIS — L723 Sebaceous cyst: Secondary | ICD-10-CM | POA: Diagnosis not present

## 2014-05-27 DIAGNOSIS — D485 Neoplasm of uncertain behavior of skin: Secondary | ICD-10-CM | POA: Diagnosis not present

## 2014-05-27 DIAGNOSIS — L821 Other seborrheic keratosis: Secondary | ICD-10-CM | POA: Diagnosis not present

## 2014-05-27 DIAGNOSIS — L570 Actinic keratosis: Secondary | ICD-10-CM | POA: Diagnosis not present

## 2014-06-18 DIAGNOSIS — H903 Sensorineural hearing loss, bilateral: Secondary | ICD-10-CM | POA: Diagnosis not present

## 2014-06-18 DIAGNOSIS — H9313 Tinnitus, bilateral: Secondary | ICD-10-CM | POA: Diagnosis not present

## 2014-07-05 ENCOUNTER — Other Ambulatory Visit: Payer: Self-pay | Admitting: Cardiovascular Disease

## 2014-07-19 DIAGNOSIS — H903 Sensorineural hearing loss, bilateral: Secondary | ICD-10-CM | POA: Diagnosis not present

## 2014-07-19 DIAGNOSIS — H9313 Tinnitus, bilateral: Secondary | ICD-10-CM | POA: Diagnosis not present

## 2014-07-20 ENCOUNTER — Other Ambulatory Visit: Payer: Self-pay | Admitting: Cardiovascular Disease

## 2014-08-19 DIAGNOSIS — Z961 Presence of intraocular lens: Secondary | ICD-10-CM | POA: Diagnosis not present

## 2014-08-19 DIAGNOSIS — R7309 Other abnormal glucose: Secondary | ICD-10-CM | POA: Diagnosis not present

## 2014-08-19 DIAGNOSIS — H01001 Unspecified blepharitis right upper eyelid: Secondary | ICD-10-CM | POA: Diagnosis not present

## 2014-08-19 DIAGNOSIS — H01004 Unspecified blepharitis left upper eyelid: Secondary | ICD-10-CM | POA: Diagnosis not present

## 2014-10-22 ENCOUNTER — Other Ambulatory Visit: Payer: Self-pay | Admitting: Cardiovascular Disease

## 2014-10-25 ENCOUNTER — Ambulatory Visit (INDEPENDENT_AMBULATORY_CARE_PROVIDER_SITE_OTHER): Payer: Medicare Other | Admitting: Cardiovascular Disease

## 2014-10-25 ENCOUNTER — Encounter: Payer: Self-pay | Admitting: Cardiovascular Disease

## 2014-10-25 VITALS — BP 146/80 | HR 62 | Ht 67.0 in | Wt 192.0 lb

## 2014-10-25 DIAGNOSIS — I10 Essential (primary) hypertension: Secondary | ICD-10-CM

## 2014-10-25 DIAGNOSIS — I2510 Atherosclerotic heart disease of native coronary artery without angina pectoris: Secondary | ICD-10-CM

## 2014-10-25 NOTE — Progress Notes (Signed)
Cardiology Office Note   Date:  10/25/2014   ID:  Herbert Marquez, DOB [birth year], MRN [id number]  PCP:  Herbert Sheller, MD  Cardiologist:   Herbert Headings, MD   Chief Complaint  Patient presents with  . Coronary Artery Disease   Problem list:  1. Coronary artery disease-status post PTCA and stenting of the RCA in [place], [place]  2. Dyslipidemia  3. Hypertension  4. Diabetes mellitus  5. Hernia repair   History of Present Illness:  Pt has done well from a cardiac standpoint. He fell and broke his left hip in January . His is recovering well. He is still walking with a cane. He had no complications during or after surgery.  He had a severe UTI in June / July of 2013. He has been seen by Urology and was started on Tamulosin for BPH.  He's not had any episodes of chest pain or shortness breath. He still walks with a cane but is able to get out and stays fairly active.  October 02, 2012:  Herbert Marquez is doing well. No CP , no dyspnea. He remains active - went to the beach last week. He walks at [facility] when he is down there. Goes to the [facility] 3 times a week. No angina.  Jan. 30, 2015:  Herbert Marquez is doing well. He has not had any cardiac problems. He has been to the beach recently.  He goes to the [facility] 2-3 times a week. He doesn't have any angina. His primary medical doctor is managing his lipids.    Feb. 18, 2016:   Herbert Marquez is a 81 y.o. male who presents for follow up of his CAD. NO CP or dyspnea.    Limited by leg and back pains.  He and his wife celebrated their 59th wedding anniversary.   They went to [facility] last week.    October 25, 2014:  Doing well from a cardiac standpoint..  Has a circk in his neck Staying buys,   Still has his place at [facility].   Past Medical History  Diagnosis Date  . Coronary artery disease   . Hyperlipidemia   . Hypertension   . Diabetes mellitus   . BPH (benign prostatic hypertrophy)   . TIA (transient ischemic  attack)     Approximately 6 weeks post-cardiac catheterization.   . MI (myocardial infarction)     Past Surgical History  Procedure Laterality Date  . Coronary angioplasty with stent placement  10/2004    stenting x 2 to RCA  . Hernia repair    . Cataract extraction, bilateral    . Cardiovascular stress test  07/01/2007    EF 74%  . Hip arthroplasty  03/09/2011    Procedure: ARTHROPLASTY BIPOLAR HIP;  Surgeon: Herbert Marquez;  Location: [facility];  Service: Orthopedics;  Laterality: Left;     Current Outpatient Prescriptions  Medication Sig Dispense Refill  . aspirin 81 MG tablet Take 81 mg by mouth daily.     [name] atorvastatin (LIPITOR) 40 MG tablet TAKE ONE TABLET BY MOUTH ONCE DAILY. 90 tablet 1  . diphenhydrAMINE (BENADRYL) 25 MG tablet Take 25 mg by mouth every 6 (six) hours as needed. Rhinorrhea    . metoprolol succinate (TOPROL-XL) 100 MG 24 hr tablet TAKE ONE TABLET BY MOUTH ONCE DAILY WITH MEALS 90 tablet 0  . Multiple Vitamin (MULTIVITAMIN) tablet Take 1 tablet by mouth daily.     [name] NITROSTAT 0.4 MG SL  tablet DISSOLVE ONE TABLET UNDER THE TONGUE EVERY 5 MINUTES AS NEEDED FOR CHEST PAIN.  DO NOT EXCEED A TOTAL OF 3 DOSES IN 15 MINUTES 25 tablet 0  . omeprazole (PRILOSEC) 20 MG capsule Take 20 mg by mouth daily.     . pioglitazone (ACTOS) 30 MG tablet Take 15 mg by mouth daily.     . ramipril (ALTACE) 2.5 MG capsule Take 2.5 mg by mouth daily.    . Tamsulosin HCl (FLOMAX) 0.4 MG CAPS Take by mouth daily after supper.    . triamterene-hydrochlorothiazide (DYAZIDE) 37.5-25 MG per capsule Take 1 capsule by mouth daily. 1/4 tab daily.     No current facility-administered medications for this visit.    Allergies:   Review of patient's allergies indicates no known allergies.    Social History:  The patient  reports that he quit smoking about 43 years ago. He has never used smokeless tobacco. He reports that he drinks about 6.0 - 8.4 oz of alcohol per week. He reports that he does not  use illicit drugs.   Family History:  The patient's family history includes Hypertension in his mother; Lung cancer in his brother and father.    ROS:  Please see the history of present illness.    Review of Systems: Constitutional:  denies fever, chills, diaphoresis, appetite change and fatigue.  HEENT: denies photophobia, eye pain, redness, hearing loss, ear pain, congestion, sore throat, rhinorrhea, sneezing, neck pain, neck stiffness and tinnitus.  Respiratory: denies SOB, DOE, cough, chest tightness, and wheezing.  Cardiovascular: denies chest pain, palpitations and leg swelling.  Gastrointestinal: denies nausea, vomiting, abdominal pain, diarrhea, constipation, blood in stool.  Genitourinary: denies dysuria, urgency, frequency, hematuria, flank pain and difficulty urinating.  Musculoskeletal: denies  myalgias, back pain, joint swelling, arthralgias and gait problem.   Skin: denies pallor, rash and wound.  Neurological: denies dizziness, seizures, syncope, weakness, light-headedness, numbness and headaches.   Hematological: denies adenopathy, easy bruising, personal or family bleeding history.  Psychiatric/ Behavioral: denies suicidal ideation, mood changes, confusion, nervousness, sleep disturbance and agitation.       All other systems are reviewed and negative.    PHYSICAL EXAM: VS:  BP 146/80 mmHg  Pulse 62  Ht 5\' 7"  (1.702 m)  Wt 87.091 kg (192 lb)  BMI 30.06 kg/m2 , BMI Body mass index is 30.06 kg/(m^2). GEN: Well nourished, well developed, in no acute distress HEENT: normal Neck: no JVD, carotid bruits, or masses Cardiac: RRR; no murmurs, rubs, or gallops,no edema  Respiratory:  clear to auscultation bilaterally, normal work of breathing GI: soft, nontender, nondistended, + BS MS: no deformity or atrophy Skin: warm and dry, no rash Neuro:  Strength and sensation are intact Psych: normal   EKG:  EKG is not ordered today.    Recent Labs: 04/22/2014: ALT 12;  BUN 23; Creatinine, Ser 1.60*; Potassium 4.5; Sodium 131*    Lipid Panel    Component Value Date/Time   CHOL 114 04/22/2014 1015   TRIG 91.0 04/22/2014 1015   HDL 36.20* 04/22/2014 1015   CHOLHDL 3 04/22/2014 1015   VLDL 18.2 04/22/2014 1015   LDLCALC 60 04/22/2014 1015      Wt Readings from Last 3 Encounters:  10/25/14 87.091 kg (192 lb)  04/22/14 88.996 kg (196 lb 3.2 oz)  10/19/13 88.814 kg (195 lb 12.8 oz)      Other studies Reviewed: Additional studies/ records that were reviewed today include: . Review of the above records demonstrates:  ASSESSMENT AND PLAN:  1. Coronary artery disease-status post PTCA and stenting of the RCA in [place], [place]  - he is doing well.  No angina.  Remains very active. He goes to the [facility] 2-3 times every week. 2. Dyslipidemia - his labs are managed by his primary medical doctor. Continue current dose of atorvastatin 40 g a day  3. Hypertension  - his blood pressures based well-controlled. It is a little elevated today. I've encouraged him to work on a good exercise weight loss. Continue same medications.  4. Diabetes mellitus  - followed by his general medical doctor.  5. Hernia repair    Current medicines are reviewed at length with the patient today.  The patient does not have concerns regarding medicines.  The following changes have been made:  no change   Disposition:   FU with me in 6 months    Signed, Damari Suastegui, Wonda Cheng, MD  10/25/2014 4:50 PM    [facility] [place], [place], [place]  [address] Phone: [phone]; Fax: [phone]

## 2014-10-25 NOTE — Patient Instructions (Signed)
Medication Instructions:  Your physician recommends that you continue on your current medications as directed. Please refer to the Current Medication list given to you today.   Labwork: None Ordered   Testing/Procedures: None Ordered   Follow-Up: Your physician wants you to follow-up in: 6 months with Dr. Nahser.  You will receive a reminder letter in the mail two months in advance. If you don't receive a letter, please call our office to schedule the follow-up appointment.     

## 2014-11-16 DIAGNOSIS — I10 Essential (primary) hypertension: Secondary | ICD-10-CM | POA: Diagnosis not present

## 2014-11-16 DIAGNOSIS — M810 Age-related osteoporosis without current pathological fracture: Secondary | ICD-10-CM | POA: Diagnosis not present

## 2014-11-16 DIAGNOSIS — E119 Type 2 diabetes mellitus without complications: Secondary | ICD-10-CM | POA: Diagnosis not present

## 2014-11-16 DIAGNOSIS — Z125 Encounter for screening for malignant neoplasm of prostate: Secondary | ICD-10-CM | POA: Diagnosis not present

## 2014-11-16 DIAGNOSIS — E1122 Type 2 diabetes mellitus with diabetic chronic kidney disease: Secondary | ICD-10-CM | POA: Diagnosis not present

## 2014-11-22 DIAGNOSIS — R350 Frequency of micturition: Secondary | ICD-10-CM | POA: Diagnosis not present

## 2014-11-22 DIAGNOSIS — R3912 Poor urinary stream: Secondary | ICD-10-CM | POA: Diagnosis not present

## 2014-11-22 DIAGNOSIS — N401 Enlarged prostate with lower urinary tract symptoms: Secondary | ICD-10-CM | POA: Diagnosis not present

## 2014-11-22 DIAGNOSIS — R3915 Urgency of urination: Secondary | ICD-10-CM | POA: Diagnosis not present

## 2014-11-22 DIAGNOSIS — R351 Nocturia: Secondary | ICD-10-CM | POA: Diagnosis not present

## 2014-12-01 DIAGNOSIS — Z23 Encounter for immunization: Secondary | ICD-10-CM | POA: Diagnosis not present

## 2014-12-30 ENCOUNTER — Other Ambulatory Visit: Payer: Self-pay | Admitting: Cardiovascular Disease

## 2015-01-22 ENCOUNTER — Other Ambulatory Visit: Payer: Self-pay | Admitting: Cardiovascular Disease

## 2015-03-03 DIAGNOSIS — I509 Heart failure, unspecified: Secondary | ICD-10-CM | POA: Diagnosis not present

## 2015-03-03 DIAGNOSIS — E1122 Type 2 diabetes mellitus with diabetic chronic kidney disease: Secondary | ICD-10-CM | POA: Diagnosis not present

## 2015-03-03 DIAGNOSIS — F17211 Nicotine dependence, cigarettes, in remission: Secondary | ICD-10-CM | POA: Diagnosis not present

## 2015-03-03 DIAGNOSIS — N183 Chronic kidney disease, stage 3 (moderate): Secondary | ICD-10-CM | POA: Diagnosis not present

## 2015-03-03 DIAGNOSIS — I129 Hypertensive chronic kidney disease with stage 1 through stage 4 chronic kidney disease, or unspecified chronic kidney disease: Secondary | ICD-10-CM | POA: Diagnosis not present

## 2015-04-26 ENCOUNTER — Other Ambulatory Visit: Payer: Self-pay | Admitting: Cardiovascular Disease

## 2015-04-26 DIAGNOSIS — L570 Actinic keratosis: Secondary | ICD-10-CM | POA: Diagnosis not present

## 2015-04-26 DIAGNOSIS — Z85828 Personal history of other malignant neoplasm of skin: Secondary | ICD-10-CM | POA: Diagnosis not present

## 2015-04-26 DIAGNOSIS — L723 Sebaceous cyst: Secondary | ICD-10-CM | POA: Diagnosis not present

## 2015-04-26 DIAGNOSIS — L821 Other seborrheic keratosis: Secondary | ICD-10-CM | POA: Diagnosis not present

## 2015-05-10 ENCOUNTER — Encounter: Payer: Self-pay | Admitting: Cardiovascular Disease

## 2015-05-10 ENCOUNTER — Ambulatory Visit (INDEPENDENT_AMBULATORY_CARE_PROVIDER_SITE_OTHER): Payer: Medicare Other | Admitting: Cardiovascular Disease

## 2015-05-10 VITALS — BP 122/68 | HR 57 | Ht 67.0 in | Wt 190.8 lb

## 2015-05-10 DIAGNOSIS — I2510 Atherosclerotic heart disease of native coronary artery without angina pectoris: Secondary | ICD-10-CM

## 2015-05-10 DIAGNOSIS — E785 Hyperlipidemia, unspecified: Secondary | ICD-10-CM

## 2015-05-10 DIAGNOSIS — I10 Essential (primary) hypertension: Secondary | ICD-10-CM

## 2015-05-10 LAB — HEPATIC FUNCTION PANEL
ALT: 12 U/L (ref 9–46)
AST: 25 U/L (ref 10–35)
Albumin: 3.7 g/dL (ref 3.6–5.1)
Alkaline Phosphatase: 60 U/L (ref 40–115)
Bilirubin, Direct: 0.1 mg/dL (ref <=0.2)
Indirect Bilirubin: 0.4 mg/dL (ref 0.2–1.2)
Total Bilirubin: 0.5 mg/dL (ref 0.2–1.2)
Total Protein: 6.2 g/dL (ref 6.1–8.1)

## 2015-05-10 LAB — LIPID PANEL
Cholesterol: 116 mg/dL — ABNORMAL LOW (ref 125–200)
HDL: 37 mg/dL — ABNORMAL LOW (ref >=40)
LDL Cholesterol: 62 mg/dL (ref <130)
Total CHOL/HDL Ratio: 3.1 Ratio (ref <=5.0)
Triglycerides: 87 mg/dL (ref <150)
VLDL: 17 mg/dL (ref <30)

## 2015-05-10 LAB — BASIC METABOLIC PANEL
BUN: 18 mg/dL (ref 7–25)
CO2: 25 mmol/L (ref 20–31)
Calcium: 8.9 mg/dL (ref 8.6–10.3)
Chloride: 100 mmol/L (ref 98–110)
Creat: 1.89 mg/dL — ABNORMAL HIGH (ref 0.70–1.11)
Glucose, Bld: 112 mg/dL — ABNORMAL HIGH (ref 65–99)
Potassium: 4.3 mmol/L (ref 3.5–5.3)
Sodium: 134 mmol/L — ABNORMAL LOW (ref 135–146)

## 2015-05-10 MED ORDER — NITROGLYCERIN 0.4 MG SL SUBL: SUBLINGUAL_TABLET | SUBLINGUAL | Status: DC

## 2015-05-10 NOTE — Progress Notes (Addendum)
Cardiology Office Note   Date:  05/10/2015   ID:  Herbert Marquez, DOB Jan 24, 1928, MRN [id number]  PCP:  Herbert Sheller, MD  Cardiologist:   Herbert Headings, MD   Chief Complaint  Patient presents with  . Coronary Artery Disease   Problem list:  1. Coronary artery disease-status post PTCA and stenting of the RCA in [place], [place]  2. Dyslipidemia  3. Hypertension  4. Diabetes mellitus  5. Hernia repair   History of Present Illness:  Pt has done well from a cardiac standpoint. He fell and broke his left hip in January . His is recovering well. He is still walking with a cane. He had no complications during or after surgery.  He had a severe UTI in June / July of 2013. He has been seen by Urology and was started on Tamulosin for BPH.  He's not had any episodes of chest pain or shortness breath. He still walks with a cane but is able to get out and stays fairly active.  October 02, 2012:  Herbert Marquez is doing well. No CP , no dyspnea. He remains active - went to the beach last week. He walks at [facility] when he is down there. Goes to the [facility] 3 times a week. No angina.  Jan. 30, 2015:  Herbert Marquez is doing well. He has not had any cardiac problems. He has been to the beach recently.  He goes to the [facility] 2-3 times a week. He doesn't have any angina. His primary medical doctor is managing his lipids.    Feb. 18, 2016:   Herbert Marquez is a 82 y.o. male who presents for follow up of his CAD. NO CP or dyspnea.    Limited by leg and back pains.  He and his wife celebrated their 59th wedding anniversary.   They went to [facility] last week.    October 25, 2014:  Doing well from a cardiac standpoint..  Has a circk in his neck Staying buys,   Still has his place at [facility]   May 10, 2015: Doing great. Some DOE  Still going down to [facility]  No CP, Wants refill on NTG - lost his bottle    Past Medical History  Diagnosis Date  . Coronary artery disease   .  Hyperlipidemia   . Hypertension   . Diabetes mellitus   . BPH (benign prostatic hypertrophy)   . TIA (transient ischemic attack)     Approximately 6 weeks post-cardiac catheterization.   . MI (myocardial infarction) [facility])     Past Surgical History  Procedure Laterality Date  . Coronary angioplasty with stent placement  10/2004    stenting x 2 to RCA  . Hernia repair    . Cataract extraction, bilateral    . Cardiovascular stress test  07/01/2007    EF 74%  . Hip arthroplasty  03/09/2011    Procedure: ARTHROPLASTY BIPOLAR HIP;  Surgeon: Herbert Marquez;  Location: [facility];  Service: Orthopedics;  Laterality: Left;     Current Outpatient Prescriptions  Medication Sig Dispense Refill  . aspirin 81 MG tablet Take 81 mg by mouth daily.     [name] atorvastatin (LIPITOR) 40 MG tablet TAKE ONE TABLET BY MOUTH ONCE DAILY 90 tablet 2  . diphenhydrAMINE (BENADRYL) 25 MG tablet Take 25 mg by mouth every 6 (six) hours as needed. Rhinorrhea    . metoprolol succinate (TOPROL-XL) 100 MG 24 hr tablet TAKE ONE TABLET BY  MOUTH ONCE DAILY WITH MEALS 90 tablet 3  . Multiple Vitamin (MULTIVITAMIN) tablet Take 1 tablet by mouth daily.     [name] NITROSTAT 0.4 MG SL tablet DISSOLVE ONE TABLET UNDER THE TONGUE EVERY 5 MINUTES AS NEEDED FOR CHEST PAIN.  DO NOT EXCEED A TOTAL OF 3 DOSES IN 15 MINUTES 25 tablet 0  . omeprazole (PRILOSEC) 20 MG capsule Take 20 mg by mouth daily.     . pioglitazone (ACTOS) 30 MG tablet Take 15 mg by mouth daily.     . ramipril (ALTACE) 2.5 MG capsule Take 2.5 mg by mouth daily.    . Tamsulosin HCl (FLOMAX) 0.4 MG CAPS Take by mouth daily after supper.    . triamterene-hydrochlorothiazide (DYAZIDE) 37.5-25 MG per capsule Take 1 capsule by mouth daily. 1/4 tab daily.     No current facility-administered medications for this visit.    Allergies:   Review of patient's allergies indicates no known allergies.    Social History:  The patient  reports that he quit smoking about 44 years ago. He  has never used smokeless tobacco. He reports that he drinks about 6.0 - 8.4 oz of alcohol per week. He reports that he does not use illicit drugs.   Family History:  The patient's family history includes Hypertension in his mother; Lung cancer in his brother and father.    ROS:  Please see the history of present illness.    Review of Systems: Constitutional:  denies fever, chills, diaphoresis, appetite change and fatigue.  HEENT: denies photophobia, eye pain, redness, hearing loss, ear pain, congestion, sore throat, rhinorrhea, sneezing, neck pain, neck stiffness and tinnitus.  Respiratory: denies SOB, DOE, cough, chest tightness, and wheezing.  Cardiovascular: denies chest pain, palpitations and leg swelling.  Gastrointestinal: denies nausea, vomiting, abdominal pain, diarrhea, constipation, blood in stool.  Genitourinary: denies dysuria, urgency, frequency, hematuria, flank pain and difficulty urinating.  Musculoskeletal: denies  myalgias, back pain, joint swelling, arthralgias and gait problem.   Skin: denies pallor, rash and wound.  Neurological: denies dizziness, seizures, syncope, weakness, light-headedness, numbness and headaches.   Hematological: denies adenopathy, easy bruising, personal or family bleeding history.  Psychiatric/ Behavioral: denies suicidal ideation, mood changes, confusion, nervousness, sleep disturbance and agitation.       All other systems are reviewed and negative.    PHYSICAL EXAM: VS:  BP 122/68 mmHg  Pulse 57  Ht 5\' 7"  (1.702 m)  Wt 190 lb 12.8 oz (86.546 kg)  BMI 29.88 kg/m2 , BMI Body mass index is 29.88 kg/(m^2). GEN: Well nourished, well developed, in no acute distress HEENT: normal Neck: no JVD, carotid bruits, or masses Cardiac: RRR; no murmurs, rubs, or gallops,no edema  Respiratory:  clear to auscultation bilaterally, normal work of breathing GI: soft, nontender, nondistended, + BS MS: no deformity or atrophy Skin: warm and dry, no  rash Neuro:  Strength and sensation are intact Psych: normal   EKG:  EKG is ordered today. Sinus brady at 57.   1st degree AV block  LAHB   Recent Labs: No results found for requested labs within last 365 days.    Lipid Panel    Component Value Date/Time   CHOL 114 04/22/2014 1015   TRIG 91.0 04/22/2014 1015   HDL 36.20* 04/22/2014 1015   CHOLHDL 3 04/22/2014 1015   VLDL 18.2 04/22/2014 1015   LDLCALC 60 04/22/2014 1015      Wt Readings from Last 3 Encounters:  05/10/15 190 lb 12.8 oz (86.546 kg)  10/25/14 192 lb (87.091 kg)  04/22/14 196 lb 3.2 oz (88.996 kg)      Other studies Reviewed: Additional studies/ records that were reviewed today include: . Review of the above records demonstrates:    ASSESSMENT AND PLAN:  1. Coronary artery disease-status post PTCA and stenting of the RCA in [place], [place]  - he is doing well.  No angina.  Remains very active. He goes to the [facility] 2-3 times every week. 2. Dyslipidemia - will check labs today . Continue current dose of atorvastatin 40 g a day  3. Hypertension  - his blood pressures based well-controlled. It is a little elevated today. I've encouraged him to work on a good exercise weight loss. Continue same medications.  4. Diabetes mellitus  - followed by his general medical doctor.  5. Hernia repair    Current medicines are reviewed at length with the patient today.  The patient does not have concerns regarding medicines.  The following changes have been made:  no change   Disposition:   FU with me in 1 year    Signed, , Wonda Cheng, MD  05/10/2015 9:25 AM    [facility] [place], [place],   [address] Phone: [phone]; Fax: [phone]

## 2015-05-10 NOTE — Patient Instructions (Signed)
Medication Instructions:  Your physician recommends that you continue on your current medications as directed. Please refer to the Current Medication list given to you today.  Labwork: Bmet, Lft, Lipid today  Testing/Procedures: None ordered  Follow-Up: Your physician wants you to follow-up in: 1 year with Dr.Nahser You will receive a reminder letter in the mail two months in advance. If you don't receive a letter, please call our office to schedule the follow-up appointment.   Any Other Special Instructions Will Be Listed Below (If Applicable).     If you need a refill on your cardiac medications before your next appointment, please call your pharmacy.

## 2015-05-13 ENCOUNTER — Telehealth: Payer: Self-pay | Admitting: Nurse Practitioner

## 2015-05-13 DIAGNOSIS — I10 Essential (primary) hypertension: Secondary | ICD-10-CM

## 2015-05-13 NOTE — Telephone Encounter (Signed)
-----   Message from [place] Headings, MD sent at 05/13/2015  5:15 PM EST ----- DC Altace. Creatinine is elevated  Recheck MP  in 1 month

## 2015-05-13 NOTE — Telephone Encounter (Signed)
Reviewed results and plan of care with patient and wife.  They verbalized understanding to d/c ramipril (altace) and patient is scheduled for repeat bmet on 4/11.

## 2015-06-14 ENCOUNTER — Other Ambulatory Visit (INDEPENDENT_AMBULATORY_CARE_PROVIDER_SITE_OTHER): Payer: Medicare Other

## 2015-06-14 DIAGNOSIS — I10 Essential (primary) hypertension: Secondary | ICD-10-CM | POA: Diagnosis not present

## 2015-06-14 LAB — BASIC METABOLIC PANEL
BUN: 16 mg/dL (ref 7–25)
CO2: 23 mmol/L (ref 20–31)
Calcium: 9.4 mg/dL (ref 8.6–10.3)
Chloride: 100 mmol/L (ref 98–110)
Creat: 1.36 mg/dL — ABNORMAL HIGH (ref 0.70–1.11)
Glucose, Bld: 100 mg/dL — ABNORMAL HIGH (ref 65–99)
Potassium: 5.2 mmol/L (ref 3.5–5.3)
Sodium: 135 mmol/L (ref 135–146)

## 2015-06-16 ENCOUNTER — Observation Stay (HOSPITAL_COMMUNITY): Payer: Medicare Other

## 2015-06-16 ENCOUNTER — Inpatient Hospital Stay (HOSPITAL_COMMUNITY)
Admission: EM | Admit: 2015-06-16 | Discharge: 2015-06-21 | DRG: 065 | Disposition: A | Discharge status: Rehabilitation Facility | Payer: Medicare Other | Attending: Internal Medicine | Admitting: Internal Medicine

## 2015-06-16 ENCOUNTER — Emergency Department (HOSPITAL_COMMUNITY): Payer: Medicare Other

## 2015-06-16 ENCOUNTER — Encounter (HOSPITAL_COMMUNITY): Payer: Self-pay | Admitting: Emergency Medicine

## 2015-06-16 DIAGNOSIS — N189 Chronic kidney disease, unspecified: Secondary | ICD-10-CM | POA: Diagnosis present

## 2015-06-16 DIAGNOSIS — I2510 Atherosclerotic heart disease of native coronary artery without angina pectoris: Secondary | ICD-10-CM | POA: Insufficient documentation

## 2015-06-16 DIAGNOSIS — E871 Hypo-osmolality and hyponatremia: Secondary | ICD-10-CM | POA: Diagnosis not present

## 2015-06-16 DIAGNOSIS — E1122 Type 2 diabetes mellitus with diabetic chronic kidney disease: Secondary | ICD-10-CM | POA: Diagnosis not present

## 2015-06-16 DIAGNOSIS — I471 Supraventricular tachycardia, unspecified: Secondary | ICD-10-CM | POA: Insufficient documentation

## 2015-06-16 DIAGNOSIS — I6330 Cerebral infarction due to thrombosis of unspecified cerebral artery: Secondary | ICD-10-CM | POA: Insufficient documentation

## 2015-06-16 DIAGNOSIS — R4182 Altered mental status, unspecified: Secondary | ICD-10-CM | POA: Diagnosis not present

## 2015-06-16 DIAGNOSIS — G459 Transient cerebral ischemic attack, unspecified: Secondary | ICD-10-CM | POA: Diagnosis not present

## 2015-06-16 DIAGNOSIS — I10 Essential (primary) hypertension: Secondary | ICD-10-CM | POA: Diagnosis not present

## 2015-06-16 DIAGNOSIS — Z955 Presence of coronary angioplasty implant and graft: Secondary | ICD-10-CM

## 2015-06-16 DIAGNOSIS — Z87891 Personal history of nicotine dependence: Secondary | ICD-10-CM

## 2015-06-16 DIAGNOSIS — E1142 Type 2 diabetes mellitus with diabetic polyneuropathy: Secondary | ICD-10-CM | POA: Diagnosis present

## 2015-06-16 DIAGNOSIS — R42 Dizziness and giddiness: Secondary | ICD-10-CM | POA: Diagnosis not present

## 2015-06-16 DIAGNOSIS — Z8673 Personal history of transient ischemic attack (TIA), and cerebral infarction without residual deficits: Secondary | ICD-10-CM

## 2015-06-16 DIAGNOSIS — I63333 Cerebral infarction due to thrombosis of bilateral posterior cerebral arteries: Principal | ICD-10-CM | POA: Diagnosis present

## 2015-06-16 DIAGNOSIS — R1319 Other dysphagia: Secondary | ICD-10-CM | POA: Diagnosis present

## 2015-06-16 DIAGNOSIS — N179 Acute kidney failure, unspecified: Secondary | ICD-10-CM | POA: Insufficient documentation

## 2015-06-16 DIAGNOSIS — E118 Type 2 diabetes mellitus with unspecified complications: Secondary | ICD-10-CM | POA: Insufficient documentation

## 2015-06-16 DIAGNOSIS — R531 Weakness: Secondary | ICD-10-CM

## 2015-06-16 DIAGNOSIS — G8191 Hemiplegia, unspecified affecting right dominant side: Secondary | ICD-10-CM | POA: Diagnosis not present

## 2015-06-16 DIAGNOSIS — R55 Syncope and collapse: Secondary | ICD-10-CM | POA: Diagnosis present

## 2015-06-16 DIAGNOSIS — E785 Hyperlipidemia, unspecified: Secondary | ICD-10-CM | POA: Insufficient documentation

## 2015-06-16 DIAGNOSIS — R509 Fever, unspecified: Secondary | ICD-10-CM | POA: Insufficient documentation

## 2015-06-16 DIAGNOSIS — K219 Gastro-esophageal reflux disease without esophagitis: Secondary | ICD-10-CM | POA: Diagnosis present

## 2015-06-16 DIAGNOSIS — N400 Enlarged prostate without lower urinary tract symptoms: Secondary | ICD-10-CM | POA: Diagnosis present

## 2015-06-16 DIAGNOSIS — Z79899 Other long term (current) drug therapy: Secondary | ICD-10-CM

## 2015-06-16 DIAGNOSIS — R6510 Systemic inflammatory response syndrome (SIRS) of non-infectious origin without acute organ dysfunction: Secondary | ICD-10-CM | POA: Diagnosis not present

## 2015-06-16 DIAGNOSIS — E669 Obesity, unspecified: Secondary | ICD-10-CM | POA: Diagnosis present

## 2015-06-16 DIAGNOSIS — I639 Cerebral infarction, unspecified: Secondary | ICD-10-CM | POA: Diagnosis not present

## 2015-06-16 DIAGNOSIS — R29810 Facial weakness: Secondary | ICD-10-CM | POA: Diagnosis present

## 2015-06-16 DIAGNOSIS — R404 Transient alteration of awareness: Secondary | ICD-10-CM | POA: Diagnosis not present

## 2015-06-16 DIAGNOSIS — Z7982 Long term (current) use of aspirin: Secondary | ICD-10-CM

## 2015-06-16 DIAGNOSIS — I69991 Dysphagia following unspecified cerebrovascular disease: Secondary | ICD-10-CM | POA: Insufficient documentation

## 2015-06-16 DIAGNOSIS — R000 Tachycardia, unspecified: Secondary | ICD-10-CM

## 2015-06-16 DIAGNOSIS — R0682 Tachypnea, not elsewhere classified: Secondary | ICD-10-CM

## 2015-06-16 DIAGNOSIS — E876 Hypokalemia: Secondary | ICD-10-CM | POA: Insufficient documentation

## 2015-06-16 DIAGNOSIS — Z96642 Presence of left artificial hip joint: Secondary | ICD-10-CM | POA: Diagnosis present

## 2015-06-16 DIAGNOSIS — D72829 Elevated white blood cell count, unspecified: Secondary | ICD-10-CM | POA: Insufficient documentation

## 2015-06-16 DIAGNOSIS — R29701 NIHSS score 1: Secondary | ICD-10-CM | POA: Diagnosis present

## 2015-06-16 DIAGNOSIS — I252 Old myocardial infarction: Secondary | ICD-10-CM

## 2015-06-16 DIAGNOSIS — I129 Hypertensive chronic kidney disease with stage 1 through stage 4 chronic kidney disease, or unspecified chronic kidney disease: Secondary | ICD-10-CM | POA: Diagnosis present

## 2015-06-16 DIAGNOSIS — Z85828 Personal history of other malignant neoplasm of skin: Secondary | ICD-10-CM

## 2015-06-16 DIAGNOSIS — Z6829 Body mass index (BMI) 29.0-29.9, adult: Secondary | ICD-10-CM

## 2015-06-16 DIAGNOSIS — I63332 Cerebral infarction due to thrombosis of left posterior cerebral artery: Secondary | ICD-10-CM | POA: Diagnosis not present

## 2015-06-16 HISTORY — DX: Basal cell carcinoma of skin, unspecified: C44.91

## 2015-06-16 HISTORY — DX: Unspecified osteoarthritis, unspecified site: M19.90

## 2015-06-16 HISTORY — DX: Gastro-esophageal reflux disease without esophagitis: K21.9

## 2015-06-16 HISTORY — DX: Cerebral infarction, unspecified: I63.9

## 2015-06-16 LAB — BASIC METABOLIC PANEL
Anion gap: 12 (ref 5–15)
BUN: 17 mg/dL (ref 6–20)
CO2: 19 mmol/L — ABNORMAL LOW (ref 22–32)
Calcium: 8.9 mg/dL (ref 8.9–10.3)
Chloride: 101 mmol/L (ref 101–111)
Creatinine, Ser: 1.46 mg/dL — ABNORMAL HIGH (ref 0.61–1.24)
GFR calc Af Amer: 49 mL/min — ABNORMAL LOW (ref >60)
GFR calc non Af Amer: 42 mL/min — ABNORMAL LOW (ref >60)
Glucose, Bld: 122 mg/dL — ABNORMAL HIGH (ref 65–99)
Potassium: 3.7 mmol/L (ref 3.5–5.1)
Sodium: 132 mmol/L — ABNORMAL LOW (ref 135–145)

## 2015-06-16 LAB — CBC
HCT: 38.9 % — ABNORMAL LOW (ref 39.0–52.0)
Hemoglobin: 12.5 g/dL — ABNORMAL LOW (ref 13.0–17.0)
MCH: 31.3 pg (ref 26.0–34.0)
MCHC: 32.1 g/dL (ref 30.0–36.0)
MCV: 97.3 fL (ref 78.0–100.0)
Platelets: 299 10*3/uL (ref 150–400)
RBC: 4 MIL/uL — ABNORMAL LOW (ref 4.22–5.81)
RDW: 14.7 % (ref 11.5–15.5)
WBC: 6.5 10*3/uL (ref 4.0–10.5)

## 2015-06-16 LAB — URINALYSIS, ROUTINE W REFLEX MICROSCOPIC
Bilirubin Urine: NEGATIVE
Glucose, UA: NEGATIVE mg/dL
Hgb urine dipstick: NEGATIVE
Ketones, ur: NEGATIVE mg/dL
Leukocytes, UA: NEGATIVE
Nitrite: NEGATIVE
Protein, ur: NEGATIVE mg/dL
Specific Gravity, Urine: 1.015 (ref 1.005–1.030)
pH: 6 (ref 5.0–8.0)

## 2015-06-16 LAB — I-STAT CHEM 8, ED
BUN: 16 mg/dL (ref 6–20)
Calcium, Ion: 1.13 mmol/L (ref 1.13–1.30)
Chloride: 99 mmol/L — ABNORMAL LOW (ref 101–111)
Creatinine, Ser: 1.3 mg/dL — ABNORMAL HIGH (ref 0.61–1.24)
Glucose, Bld: 96 mg/dL (ref 65–99)
HCT: 39 % (ref 39.0–52.0)
Hemoglobin: 13.3 g/dL (ref 13.0–17.0)
Potassium: 3.9 mmol/L (ref 3.5–5.1)
Sodium: 136 mmol/L (ref 135–145)
TCO2: 24 mmol/L (ref 0–100)

## 2015-06-16 LAB — DIFFERENTIAL
Basophils Absolute: 0 10*3/uL (ref 0.0–0.1)
Basophils Relative: 1 %
Eosinophils Absolute: 0.2 10*3/uL (ref 0.0–0.7)
Eosinophils Relative: 4 %
Lymphocytes Relative: 23 %
Lymphs Abs: 1.5 10*3/uL (ref 0.7–4.0)
Monocytes Absolute: 0.7 10*3/uL (ref 0.1–1.0)
Monocytes Relative: 10 %
Neutro Abs: 4 10*3/uL (ref 1.7–7.7)
Neutrophils Relative %: 62 %

## 2015-06-16 LAB — ETHANOL: Alcohol, Ethyl (B): <5 mg/dL (ref <5)

## 2015-06-16 LAB — GLUCOSE, CAPILLARY: Glucose-Capillary: 103 mg/dL — ABNORMAL HIGH (ref 65–99)

## 2015-06-16 LAB — RAPID URINE DRUG SCREEN, HOSP PERFORMED
Amphetamines: NOT DETECTED
Barbiturates: NOT DETECTED
Benzodiazepines: NOT DETECTED
Cocaine: NOT DETECTED
Opiates: NOT DETECTED
Tetrahydrocannabinol: NOT DETECTED

## 2015-06-16 LAB — I-STAT TROPONIN, ED
Troponin i, poc: 0.01 ng/mL (ref 0.00–0.08)
Troponin i, poc: 0.01 ng/mL (ref 0.00–0.08)

## 2015-06-16 LAB — APTT: aPTT: 29 seconds (ref 24–37)

## 2015-06-16 LAB — CBG MONITORING, ED
Glucose-Capillary: 111 mg/dL — ABNORMAL HIGH (ref 65–99)
Glucose-Capillary: 111 mg/dL — ABNORMAL HIGH (ref 65–99)

## 2015-06-16 LAB — PROTIME-INR
INR: 1.12 (ref 0.00–1.49)
Prothrombin Time: 14.6 seconds (ref 11.6–15.2)

## 2015-06-16 MED ORDER — NITROGLYCERIN 0.4 MG SL SUBL: 0.4000 mg | SUBLINGUAL_TABLET | SUBLINGUAL | Status: DC | PRN

## 2015-06-16 MED ORDER — GADOBENATE DIMEGLUMINE 529 MG/ML IV SOLN
10.0000 mL | Freq: Once | INTRAVENOUS | Status: AC | PRN
  Administered 2015-06-16: 9 mL via INTRAVENOUS

## 2015-06-16 MED ORDER — SODIUM CHLORIDE 0.9% FLUSH
3.0000 mL | Freq: Two times a day (BID) | INTRAVENOUS | Status: DC
  Administered 2015-06-17 – 2015-06-20 (×6): 3 mL via INTRAVENOUS

## 2015-06-16 MED ORDER — ATORVASTATIN CALCIUM 40 MG PO TABS: 40.0000 mg | ORAL_TABLET | Freq: Every day | ORAL | Status: DC

## 2015-06-16 MED ORDER — TRIAMTERENE-HCTZ 37.5-25 MG PO TABS: 1.0000 | ORAL_TABLET | Freq: Every day | ORAL | Status: DC

## 2015-06-16 MED ORDER — METOPROLOL SUCCINATE ER 25 MG PO TB24: 100.0000 mg | ORAL_TABLET | Freq: Every day | ORAL | Status: DC

## 2015-06-16 MED ORDER — ASPIRIN EC 81 MG PO TBEC: 81.0000 mg | DELAYED_RELEASE_TABLET | Freq: Every day | ORAL | Status: DC

## 2015-06-16 MED ORDER — TAMSULOSIN HCL 0.4 MG PO CAPS: 0.4000 mg | ORAL_CAPSULE | Freq: Every day | ORAL | Status: DC

## 2015-06-16 MED ORDER — PANTOPRAZOLE SODIUM 40 MG PO TBEC
40.0000 mg | DELAYED_RELEASE_TABLET | Freq: Every day | ORAL | Status: DC
  Administered 2015-06-18 – 2015-06-21 (×4): 40 mg via ORAL
  Filled 2015-06-16 (×4): qty 1

## 2015-06-16 MED ORDER — ENOXAPARIN SODIUM 40 MG/0.4ML ~~LOC~~ SOLN
40.0000 mg | SUBCUTANEOUS | Status: DC
  Administered 2015-06-18 – 2015-06-20 (×4): 40 mg via SUBCUTANEOUS
  Filled 2015-06-16 (×4): qty 0.4

## 2015-06-16 MED ORDER — PIOGLITAZONE HCL 15 MG PO TABS
15.0000 mg | ORAL_TABLET | Freq: Every day | ORAL | Status: DC
  Administered 2015-06-18 – 2015-06-21 (×4): 15 mg via ORAL
  Filled 2015-06-16 (×5): qty 1

## 2015-06-16 NOTE — Progress Notes (Signed)
Pt had change of status, unable to verbalize, rapid response called and at bedside, neuro called, VSS, CBG 103, rapid taking pt to CT of head and neck

## 2015-06-16 NOTE — H&P (Signed)
History and Physical  Herbert Marquez [id number] DOB: Sep 03, 1927 DOA: 06/16/2015  PCP: Thressa Sheller, MD   Chief Complaint: syncope  History of Present Illness:  Patient is a 82 yo male with history of TIA, CAD, HTN, who came with cc of syncope, un witnessed with LOC for 1-15 min. He has had no symptoms prior to that episode including no chest pain or dizziness or palpitations. He was no confused after wife found him on the floor and stood him up. He did not loose control over his bowel or urine. In the ER his exam was initially normal but later he developed right arm and leg weakness and  Jerking. On exam he had right arm drift with tongue protrusion to the left. Code stroke was called and neurology recommended stat MRI with admission to medicine. When I talked to patient and family he had no complaints. He said weakness and numbness seem to have resolved already.   Review of Systems:  CONSTITUTIONAL:  No night sweats.  No fatigue, malaise, lethargy.  No fever or chills. Eyes:  No visual changes.  No eye pain.  No eye discharge.   ENT:    No epistaxis.  No sinus pain.  No sore throat.  No ear pain.  No congestion. RESPIRATORY:  No cough.  No wheeze.  No hemoptysis.  No shortness of breath. CARDIOVASCULAR:  No chest pains.  No palpitations. GASTROINTESTINAL:  No abdominal pain.  No nausea or vomiting.  No diarrhea or constipation.  No hematemesis.  No hematochezia.  No melena. GENITOURINARY:  No urgency.  No frequency.  No dysuria.  No hematuria.  No obstructive symptoms.  No discharge.  No pain.  No significant abnormal bleeding. MUSCULOSKELETAL:  No musculoskeletal pain.  No joint swelling.  No arthritis. NEUROLOGICAL:  +confusion.  +weakness. No headache. No seizure. PSYCHIATRIC:  No depression. No anxiety. No suicidal ideation. SKIN:  No rashes.  No lesions.  No wounds. ENDOCRINE:  No unexplained weight loss.  No polydipsia.  No polyuria.  No polyphagia. HEMATOLOGIC:  No  anemia.  No purpura.  No petechiae.  No bleeding.  ALLERGIC AND IMMUNOLOGIC:  No pruritus.  No swelling Other:  Past Medical and Surgical History:   Past Medical History  Diagnosis Date  . Coronary artery disease   . Hyperlipidemia   . Hypertension   . Diabetes mellitus   . BPH (benign prostatic hypertrophy)   . TIA (transient ischemic attack)     Approximately 6 weeks post-cardiac catheterization.   . MI (myocardial infarction) [facility])    Past Surgical History  Procedure Laterality Date  . Coronary angioplasty with stent placement  10/2004    stenting x 2 to RCA  . Hernia repair    . Cataract extraction, bilateral    . Cardiovascular stress test  07/01/2007    EF 74%  . Hip arthroplasty  03/09/2011    Procedure: ARTHROPLASTY BIPOLAR HIP;  Surgeon: Mauri Pole;  Location: [facility];  Service: Orthopedics;  Laterality: Left;    Social History:   reports that he quit smoking about 44 years ago. He has never used smokeless tobacco. He reports that he drinks about 6.0 - 8.4 oz of alcohol per week. He reports that he does not use illicit drugs.   No Known Allergies  Family History  Problem Relation Age of Onset  . Lung cancer Father   . Lung cancer Brother   . Hypertension Mother       Prior to Admission medications  Medication Sig Start Date End Date Taking? Authorizing Provider  aspirin 81 MG tablet Take 81 mg by mouth daily.    Yes Historical Provider, MD  atorvastatin (LIPITOR) 40 MG tablet TAKE ONE TABLET BY MOUTH ONCE DAILY 12/30/14  Yes Thayer Headings, MD  diphenhydrAMINE (BENADRYL) 25 MG tablet Take 25 mg by mouth every 6 (six) hours as needed. Rhinorrhea   Yes Historical Provider, MD  metoprolol succinate (TOPROL-XL) 100 MG 24 hr tablet TAKE ONE TABLET BY MOUTH ONCE DAILY WITH MEALS 04/26/15  Yes Thayer Headings, MD  Multiple Vitamin (MULTIVITAMIN) tablet Take 1 tablet by mouth daily.    Yes Historical Provider, MD  nitroGLYCERIN (NITROSTAT) 0.4 MG SL tablet DISSOLVE  ONE TABLET UNDER THE TONGUE EVERY 5 MINUTES AS NEEDED FOR CHEST PAIN.  DO NOT EXCEED A TOTAL OF 3 DOSES IN 15 MINUTES 05/10/15  Yes Thayer Headings, MD  omeprazole (PRILOSEC) 20 MG capsule Take 20 mg by mouth daily.    Yes Historical Provider, MD  pioglitazone (ACTOS) 30 MG tablet Take 15 mg by mouth daily.    Yes Historical Provider, MD  Tamsulosin HCl (FLOMAX) 0.4 MG CAPS Take by mouth daily after supper.   Yes Historical Provider, MD  triamterene-hydrochlorothiazide (MAXZIDE-25) 37.5-25 MG tablet Take 1 tablet by mouth daily.   Yes Historical Provider, MD    Physical Exam: BP 162/113 mmHg  Pulse 57  Temp(Src) 97.7 F (36.5 C) (Oral)  Resp 14  Ht 5\' 7"  (1.702 m)  Wt 83.915 kg (185 lb)  BMI 28.97 kg/m2  SpO2 100%  GENERAL :  appears to be in no acute distress. HEAD: normocephalic. EYES: PERRL, EOMI.  EARS:  hearing grossly intact. NOSE: No nasal discharge. THROAT: Oral cavity and pharynx normal.  NECK: Neck supple CARDIAC: Normal S1 and S2. No S3, S4 or murmurs. Rhythm is regular. There is no peripheral edema. LUNGS: Clear to auscultation  ABDOMEN: Positive bowel sounds. Soft, nondistended, nontender.   EXTREMITIES: No significant deformity or joint abnormality.  NEUROLOGICAL: The mental examination revealed the patient was oriented to person, place, and time.CN II-XII intact. Strength and sensation is slightly reduced in right arm comparing to left but patient has 5/5 strength bilaterally. SKIN: Skin normal color           Labs on Admission:  Reviewed.   Radiological Exams on Admission: Ct Head Wo Contrast  06/16/2015  CLINICAL DATA:  Code stroke. Acute onset of right-sided weakness. Initial encounter. EXAM: CT HEAD WITHOUT CONTRAST TECHNIQUE: Contiguous axial images were obtained from the base of the skull through the vertex without intravenous contrast. COMPARISON:  MRI of the brain and CT of the head performed 03/14/2005 FINDINGS: There is no evidence of acute infarction,  mass lesion, or intra- or extra-axial hemorrhage on CT. Prominence of ventricles and sulci reflects mild to moderate cortical volume loss. Mild periventricular white matter change likely reflects small vessel ischemic microangiopathy. Chronic ischemic change is noted at the left external capsule. Mild cerebellar atrophy is noted. The brainstem and fourth ventricle are within normal limits. The cerebral hemispheres demonstrate grossly normal gray-white differentiation. No mass effect or midline shift is seen. There is no evidence of fracture; visualized osseous structures are unremarkable in appearance. The orbits are within normal limits. The paranasal sinuses and mastoid air cells are well-aerated. No significant soft tissue abnormalities are seen. IMPRESSION: 1. No acute intracranial pathology seen on CT. 2. Mild to moderate cortical volume loss and scattered small vessel ischemic microangiopathy. 3. Chronic  ischemic change at the left external capsule. These results were called by telephone at the time of interpretation on 06/16/2015 at 7:32 pm to Dr. Nicole Kindred, who verbally acknowledged these results. Electronically Signed   By: Garald Balding M.D.   On: 06/16/2015 19:35    EKG:  Independently reviewed. NSR with PVCs.  Assessment/Plan  Syncope:  DDx:  - Cardiac : structural heart disease ( AS, MS) or arrhythmia ( tachy or brady)    Get Echo. Keep on Tele. Rule out ACS. - syncope after changing position may suggest orthostatic hypotension.   Patient was working outside today ( may have been a little dehydrated)  - Neurologic : seizure vs. Stroke vs. TIA    As below.  - Vasovagal : triggered by stress, etc.  Stroke/TIA:  [id number]  MRA :ordered  2D Echo :ordered  Telemetry for 24 hours post stroke/TIA.  HgbA1c and lipid profile in am  VTE prophylaxis  Antiplatelet therapy: Asp 81  Cont statin     Neurochecks Q4H, PT/OT/Speech therapy, Neurology consult.   aspiration  precautions.  BP: holding meds for now, restart if MRI/MRA were WNL.premissive high BP. Monitor.   CKD; cr at baseline.    DVT prophylaxis: [place] enoxaparin  Consultants: Neuro Code Status: Full     Gennaro Africa M.D [facility]

## 2015-06-16 NOTE — ED Notes (Addendum)
Pt. Had episode of dizziness that led to LOC. Pt. Was found on floor of home by wife. Pt. States he does not remember passing out. Pt .denies any chest pain or SOB. Pt. Has no signs of distress at this time. EMS reports blood glucose was 120 in route to hospital.

## 2015-06-16 NOTE — ED Provider Notes (Signed)
CSN: [id number]     Arrival date & time 06/16/15  40 History   First MD Initiated Contact with Patient 06/16/15 1737     Chief Complaint  Patient presents with  . Loss of Consciousness     (Consider location/radiation/quality/duration/timing/severity/associated sxs/prior Treatment) HPI   [name] is an 82 year old male with PMH of MI, CAD s/p stent to RCA, TIA, HTN, T2DM who presents after a syncopal episode. Patient states he was outside mowing the lawn earlier today. He was resting afterwards, and hydrating with a beer. He stood up to walk inside to take the recycling out when he his vision became "funny." He says he does not remember what happened next. His wife walked into the house and found him hanging over the couch. This was around 1530 to 1600. She did not witness his syncopal episode. He was out for 2-3 minutes before waking. Wife states that patient did not look pale, but had some flushing around his cheeks. Patient and wife deny any confusion, focal weakness, slurred speech, or facial drooping. Patient denies any chest pain, palpitations, SOB, nausea, vomiting, diarrhea, diaphoresis, headache, seizure-like activity or focal weakness preceding or after his syncopal event.  Past Medical History  Diagnosis Date  . Coronary artery disease   . Hyperlipidemia   . Hypertension   . Diabetes mellitus   . BPH (benign prostatic hypertrophy)   . TIA (transient ischemic attack)     Approximately 6 weeks post-cardiac catheterization.   . MI (myocardial infarction) [facility])    Past Surgical History  Procedure Laterality Date  . Coronary angioplasty with stent placement  10/2004    stenting x 2 to RCA  . Hernia repair    . Cataract extraction, bilateral    . Cardiovascular stress test  07/01/2007    EF 74%  . Hip arthroplasty  03/09/2011    Procedure: ARTHROPLASTY BIPOLAR HIP;  Surgeon: Herbert Marquez;  Location: [facility];  Service: Orthopedics;  Laterality: Left;   Family History   Problem Relation Age of Onset  . Lung cancer Father   . Lung cancer Brother   . Hypertension Mother    Social History  Substance Use Topics  . Smoking status: Former Smoker    Quit date: 03/06/1971  . Smokeless tobacco: Never Used  . Alcohol Use: 6.0 - 8.4 oz/week    10-14 Glasses of wine per week    Review of Systems  Constitutional: Negative for fever, chills and diaphoresis.  Eyes: Positive for visual disturbance.  Respiratory: Negative for cough, chest tightness and shortness of breath.   Cardiovascular: Negative for chest pain and palpitations.  Gastrointestinal: Negative for nausea, vomiting, abdominal pain and diarrhea.  Genitourinary: Negative for dysuria and frequency.  Neurological: Positive for syncope. Negative for dizziness, seizures, weakness, light-headedness and numbness.  Psychiatric/Behavioral: Negative for confusion.      Allergies  Review of patient's allergies indicates no known allergies.  Home Medications   Prior to Admission medications   Medication Sig Start Date End Date Taking? Authorizing Provider  aspirin 81 MG tablet Take 81 mg by mouth daily.    Yes Historical Provider, MD  atorvastatin (LIPITOR) 40 MG tablet TAKE ONE TABLET BY MOUTH ONCE DAILY 12/30/14  Yes Thayer Headings, MD  diphenhydrAMINE (BENADRYL) 25 MG tablet Take 25 mg by mouth every 6 (six) hours as needed. Rhinorrhea   Yes Historical Provider, MD  metoprolol succinate (TOPROL-XL) 100 MG 24 hr tablet TAKE ONE TABLET BY MOUTH ONCE  DAILY WITH MEALS 04/26/15  Yes Thayer Headings, MD  Multiple Vitamin (MULTIVITAMIN) tablet Take 1 tablet by mouth daily.    Yes Historical Provider, MD  nitroGLYCERIN (NITROSTAT) 0.4 MG SL tablet DISSOLVE ONE TABLET UNDER THE TONGUE EVERY 5 MINUTES AS NEEDED FOR CHEST PAIN.  DO NOT EXCEED A TOTAL OF 3 DOSES IN 15 MINUTES 05/10/15  Yes Thayer Headings, MD  omeprazole (PRILOSEC) 20 MG capsule Take 20 mg by mouth daily.    Yes Historical Provider, MD   pioglitazone (ACTOS) 30 MG tablet Take 15 mg by mouth daily.    Yes Historical Provider, MD  Tamsulosin HCl (FLOMAX) 0.4 MG CAPS Take by mouth daily after supper.   Yes Historical Provider, MD  triamterene-hydrochlorothiazide (MAXZIDE-25) 37.5-25 MG tablet Take 1 tablet by mouth daily.   Yes Historical Provider, MD   BP 162/113 mmHg  Pulse 57  Temp(Src) 97.7 F (36.5 C) (Oral)  Resp 14  Ht 5\' 7"  (1.702 m)  Wt 83.915 kg  BMI 28.97 kg/m2  SpO2 100% Physical Exam  Constitutional: He is oriented to person, place, and time. He appears well-developed and well-nourished. No distress.  HENT:  Head: Normocephalic.  Mouth/Throat: Oropharynx is clear and moist.  Eyes: EOM are normal. Pupils are equal, round, and reactive to light.  Neck: Normal range of motion. Neck supple.  Cardiovascular: Normal rate and regular rhythm.   No murmur heard. Pulmonary/Chest: Effort normal. No respiratory distress. He has no wheezes. He exhibits no tenderness.  Mild RLL crackles  Abdominal: Soft. Bowel sounds are normal. He exhibits no distension. There is no tenderness.  Musculoskeletal: Normal range of motion. He exhibits no edema or tenderness.  Neurological: He is alert and oriented to person, place, and time.  Initial exam: Strength 5/5 bilateral upper and lower extremities, ROM intact, grip strength equal, rapid finger tapping intact, no dysmetria on finger to nose, EOMI, PERRL, tongue protrudes midline  Skin: He is not diaphoretic.    ED Course  Procedures (including critical care time) Labs Review Labs Reviewed  BASIC METABOLIC PANEL - Abnormal; Notable for the following:    Sodium 132 (*)    CO2 19 (*)    Glucose, Bld 122 (*)    Creatinine, Ser 1.46 (*)    GFR calc non Af Amer 42 (*)    GFR calc Af Amer 49 (*)    All other components within normal limits  CBC - Abnormal; Notable for the following:    RBC 4.00 (*)    Hemoglobin 12.5 (*)    HCT 38.9 (*)    All other components within normal  limits  CBG MONITORING, ED - Abnormal; Notable for the following:    Glucose-Capillary 111 (*)    All other components within normal limits  CBG MONITORING, ED - Abnormal; Notable for the following:    Glucose-Capillary 111 (*)    All other components within normal limits  I-STAT CHEM 8, ED - Abnormal; Notable for the following:    Chloride 99 (*)    Creatinine, Ser 1.30 (*)    All other components within normal limits  URINALYSIS, ROUTINE W REFLEX MICROSCOPIC (NOT AT [facility])  ETHANOL  PROTIME-INR  APTT  DIFFERENTIAL  URINE RAPID DRUG SCREEN, HOSP PERFORMED  BASIC METABOLIC PANEL  HEMOGLOBIN A1C  LIPID PANEL  TROPONIN I  I-STAT TROPOININ, ED  I-STAT TROPOININ, ED    Imaging Review Ct Head Wo Contrast  06/16/2015  CLINICAL DATA:  Code stroke. Acute onset of right-sided  weakness. Initial encounter. EXAM: CT HEAD WITHOUT CONTRAST TECHNIQUE: Contiguous axial images were obtained from the base of the skull through the vertex without intravenous contrast. COMPARISON:  MRI of the brain and CT of the head performed 03/14/2005 FINDINGS: There is no evidence of acute infarction, mass lesion, or intra- or extra-axial hemorrhage on CT. Prominence of ventricles and sulci reflects mild to moderate cortical volume loss. Mild periventricular white matter change likely reflects small vessel ischemic microangiopathy. Chronic ischemic change is noted at the left external capsule. Mild cerebellar atrophy is noted. The brainstem and fourth ventricle are within normal limits. The cerebral hemispheres demonstrate grossly normal gray-white differentiation. No mass effect or midline shift is seen. There is no evidence of fracture; visualized osseous structures are unremarkable in appearance. The orbits are within normal limits. The paranasal sinuses and mastoid air cells are well-aerated. No significant soft tissue abnormalities are seen. IMPRESSION: 1. No acute intracranial pathology seen on CT. 2. Mild to moderate  cortical volume loss and scattered small vessel ischemic microangiopathy. 3. Chronic ischemic change at the left external capsule. These results were called by telephone at the time of interpretation on 06/16/2015 at 7:32 pm to Dr. Nicole Kindred, who verbally acknowledged these results. Electronically Signed   By: Garald Balding M.D.   On: 06/16/2015 19:35   I have personally reviewed and evaluated these images and lab results as part of my medical decision-making.   EKG Interpretation   Date/Time:  Thursday June 16 2015 16:58:44 EDT Ventricular Rate:  58 PR Interval:  180 QRS Duration: 131 QT Interval:  438 QTC Calculation: 430 R Axis:   -138 Text Interpretation:  Sinus or ectopic atrial rhythm Nonspecific  intraventricular conduction delay Abnormal T, widespread Deviation has  changed compared to previous Confirmed by NGUYEN, EMILY ([id number]) on  06/16/2015 5:08:56 PM      MDM   Final diagnoses:  Syncope, unspecified syncope type  Transient cerebral ischemia, unspecified transient cerebral ischemia type  Weakness   82 year old male with PMH of MI, CAD s/p stent to RCA, TIA, HTN, T2DM who presents after a syncopal episode around 3:30 to 4:00 pm. Possible vasovagal vs cardiogenic vs orthostatic vs CVA. Initial EKG appears to have reversed leads. Troponin is negative and repeat EKG shows sinus rhythm, 1st degree AV block, PVC, and t-wave inversions in III and aVF similar to prior.  Patient without chest pain or palpitations or evidence for ACS. Possibly related to dehydration from outdoor activity and little fluid intake today other than 1 beer. Patient without neurologic deficit on initial exam.     7:09 PM Called to room as patient's family reported patient has an acute change and not acting himself. They report a blank stare and right arm jerking. On re-evaluation patient has decreased right grip strength and right arm drift with tongue protrusion to the left. His SBP was elevated to 174. He  is alert and oriented to person, place, and time. Code stroke called for possible CVA with last well-known around 3:30 pm. CT Head is without evidence for hemorrhage or acute pathology. Neurology consulted and have evaluated patient. MRI brain, MRA neck, and EEG ordered. Neurology has recommended admission to hospitalist service. [facility] service has agreed to admit patient.    Zada Finders, MD 06/16/15 2124  Harvel Quale, MD 06/17/15 [phone]

## 2015-06-16 NOTE — ED Notes (Signed)
Upon helping pt to urinate RN noticed that pt was acting differently than previously. Pt acting somewhat confused and seems anxious. Family members also report a change in patient's mentation status. Pt oriented X4 and following commands. MD aware and at bedside.

## 2015-06-16 NOTE — Code Documentation (Signed)
Code Stroke called on 81 year old caucasian male already in the ED.  Patient arrived via [facility] EMS status post syncopal episode.  Family and beside RN note that patient had change in mentation status and confused with noted right sided weakness. Family reports that last known well was 1800, he developed a blank stare and was unresponsive for approximately 1 minute. After which time right sided deficits were noted. Upon arrival to the room patient alert and oriented and family at bedside. Patient noted to have NIH 1 (sensory R arm and leg).  MD at bedside. Plan admit to medicine with MRI and EEG to rule out seizures.

## 2015-06-16 NOTE — Progress Notes (Signed)
Pt arrived to floor from ED, pt arrived with family at bedside, pt alert, speech garbled and having word salad, right sided weakness, bed alarm on, VSS, pt stable

## 2015-06-16 NOTE — Consult Note (Signed)
Admission H&P    Chief Complaint: Syncope and transient right-sided weakness with mental status change.  HPI: Herbert Marquez is an 82 y.o. male with a history of hypertension, hyperlipidemia, TIA and myocardial infarction, who was brought to the emergency room following an episode of loss of consciousness at home. No seizure activity was described. Episode occurred at 3:30 PM this afternoon. Initial evaluation ED was unremarkable. Patient subsequently developed weakness involving his right side with facial droop noticed as well as reduced strength of right extremities transiently. Patient also reportedly at one point had a blank stare and was unresponsive for at least 1 minute. He also had repetitive movements of his right upper extremity. CT scan of his head showed no acute intracranial abnormality. NIH stroke score at the time of this evaluation was 1, with residual sensory changes involving right extremities. Been taking aspirin for antiplatelet therapy.  LSN: 3:30 PM on 06/16/2015 tPA Given: No: Deficits resolved mRankin:  Past Medical History  Diagnosis Date  . Coronary artery disease   . Hyperlipidemia   . Hypertension   . Diabetes mellitus   . BPH (benign prostatic hypertrophy)   . TIA (transient ischemic attack)     Approximately 6 weeks post-cardiac catheterization.   . MI (myocardial infarction) [facility])     Past Surgical History  Procedure Laterality Date  . Coronary angioplasty with stent placement  10/2004    stenting x 2 to RCA  . Hernia repair    . Cataract extraction, bilateral    . Cardiovascular stress test  07/01/2007    EF 74%  . Hip arthroplasty  03/09/2011    Procedure: ARTHROPLASTY BIPOLAR HIP;  Surgeon: Mauri Pole;  Location: [facility]S;  Service: Orthopedics;  Laterality: Left;    Family History  Problem Relation Age of Onset  . Lung cancer Father   . Lung cancer Brother   . Hypertension Mother    Social History:  reports that he quit smoking about 44 years  ago. He has never used smokeless tobacco. He reports that he drinks about 6.0 - 8.4 oz of alcohol per week. He reports that he does not use illicit drugs.  Allergies: No Known Allergies  Medications: Patient's preadmission medications were reviewed by me.  ROS: History obtained from patient's wife and daughters  General ROS: negative for - chills, fatigue, fever, night sweats, weight gain or weight loss Psychological ROS: negative for - behavioral disorder, hallucinations, memory difficulties, mood swings or suicidal ideation Ophthalmic ROS: negative for - blurry vision, double vision, eye pain or loss of vision ENT ROS: negative for - epistaxis, nasal discharge, oral lesions, sore throat, tinnitus or vertigo Allergy and Immunology ROS: negative for - hives or itchy/watery eyes Hematological and Lymphatic ROS: negative for - bleeding problems, bruising or swollen lymph nodes Endocrine ROS: negative for - galactorrhea, hair pattern changes, polydipsia/polyuria or temperature intolerance Respiratory ROS: negative for - cough, hemoptysis, shortness of breath or wheezing Cardiovascular ROS: negative for - chest pain, dyspnea on exertion, edema or irregular heartbeat Gastrointestinal ROS: negative for - abdominal pain, diarrhea, hematemesis, nausea/vomiting or stool incontinence Genito-Urinary ROS: negative for - dysuria, hematuria, incontinence or urinary frequency/urgency Musculoskeletal ROS: negative for - joint swelling or muscular weakness Neurological ROS: as noted in HPI Dermatological ROS: negative for rash and skin lesion changes  Physical Examination: Blood pressure 154/71, pulse 59, temperature 97.7 F (36.5 C), temperature source Oral, resp. rate 15, height _0  (1.702 m), weight 83.915 kg (185 lb), SpO2 99 %.  HEENT-  Normocephalic, no lesions, without obvious abnormality.  Normal external eye and conjunctiva.  Normal TM's bilaterally.  Normal auditory canals and external ears.  Normal external nose, mucus membranes and septum.  Normal pharynx. Neck supple with no masses, nodes, nodules or enlargement. Cardiovascular - regular rate and rhythm, S1, S2 normal, no murmur, click, rub or gallop Lungs - chest clear, no wheezing, rales, normal symmetric air entry Abdomen - soft, non-tender; bowel sounds normal; no masses,  no organomegaly Extremities - no joint deformities, effusion, or inflammation and no edema  Neurologic Examination: Mental Status: Alert, oriented, thought content appropriate.  Speech fluent without evidence of aphasia. Able to follow commands without difficulty. Cranial Nerves: II-Visual fields were normal. III/IV/VI-Pupils were equal and reacted normally to light. Extraocular movements were full and conjugate.    V/VII-no facial numbness and no facial weakness. VIII-normal. X-normal speech. XI: trapezius strength/neck flexion strength normal bilaterally XII-midline tongue extension with normal strength. Motor: 5/5 bilaterally with normal tone and bulk Sensory: Reduced perception of tactile sensation over right extremities compared to left extremities. Deep Tendon Reflexes: 1+ and symmetric. Plantars: Flexor bilaterally Cerebellar: Normal finger-to-nose testing. Carotid auscultation: Normal  Results for orders placed or performed during the hospital encounter of 06/16/15 (from the past 48 hour(s))  Basic metabolic panel     Status: Abnormal   Collection Time: 06/16/15  5:07 PM  Result Value Ref Range   Sodium 132 (L) 135 - 145 mmol/L   Potassium 3.7 3.5 - 5.1 mmol/L   Chloride 101 101 - 111 mmol/L   CO2 19 (L) 22 - 32 mmol/L   Glucose, Bld 122 (H) 65 - 99 mg/dL   BUN 17 6 - 20 mg/dL   Creatinine, Ser 1.46 (H) 0.61 - 1.24 mg/dL   Calcium 8.9 8.9 - 10.3 mg/dL   GFR calc non Af Amer 42 (L) >60 mL/min   GFR calc Af Amer 49 (L) >60 mL/min    Comment: (NOTE) The eGFR has been calculated using the CKD EPI equation. This calculation has not  been validated in all clinical situations. eGFR's persistently <60 mL/min signify possible Chronic Kidney Disease.    Anion gap 12 5 - 15  CBC     Status: Abnormal   Collection Time: 06/16/15  5:07 PM  Result Value Ref Range   WBC 6.5 4.0 - 10.5 K/uL   RBC 4.00 (L) 4.22 - 5.81 MIL/uL   Hemoglobin 12.5 (L) 13.0 - 17.0 g/dL   HCT 38.9 (L) 39.0 - 52.0 %   MCV 97.3 78.0 - 100.0 fL   MCH 31.3 26.0 - 34.0 pg   MCHC 32.1 30.0 - 36.0 g/dL   RDW 14.7 11.5 - 15.5 %   Platelets 299 150 - 400 K/uL  Differential     Status: None   Collection Time: 06/16/15  5:07 PM  Result Value Ref Range   Neutrophils Relative % 62 %   Neutro Abs 4.0 1.7 - 7.7 K/uL   Lymphocytes Relative 23 %   Lymphs Abs 1.5 0.7 - 4.0 K/uL   Monocytes Relative 10 %   Monocytes Absolute 0.7 0.1 - 1.0 K/uL   Eosinophils Relative 4 %   Eosinophils Absolute 0.2 0.0 - 0.7 K/uL   Basophils Relative 1 %   Basophils Absolute 0.0 0.0 - 0.1 K/uL  CBG monitoring, ED     Status: Abnormal   Collection Time: 06/16/15  5:14 PM  Result Value Ref Range   Glucose-Capillary 111 (H) 65 - 99  mg/dL  I-stat troponin, ED     Status: None   Collection Time: 06/16/15  6:49 PM  Result Value Ref Range   Troponin i, poc 0.01 0.00 - 0.08 ng/mL   Comment 3            Comment: Due to the release kinetics of cTnI, a negative result within the first hours of the onset of symptoms does not rule out myocardial infarction with certainty. If myocardial infarction is still suspected, repeat the test at appropriate intervals.   CBG monitoring, ED     Status: Abnormal   Collection Time: 06/16/15  7:02 PM  Result Value Ref Range   Glucose-Capillary 111 (H) 65 - 99 mg/dL   Ct Head Wo Contrast  06/16/2015  CLINICAL DATA:  Code stroke. Acute onset of right-sided weakness. Initial encounter. EXAM: CT HEAD WITHOUT CONTRAST TECHNIQUE: Contiguous axial images were obtained from the base of the skull through the vertex without intravenous contrast.  COMPARISON:  MRI of the brain and CT of the head performed 03/14/2005 FINDINGS: There is no evidence of acute infarction, mass lesion, or intra- or extra-axial hemorrhage on CT. Prominence of ventricles and sulci reflects mild to moderate cortical volume loss. Mild periventricular white matter change likely reflects small vessel ischemic microangiopathy. Chronic ischemic change is noted at the left external capsule. Mild cerebellar atrophy is noted. The brainstem and fourth ventricle are within normal limits. The cerebral hemispheres demonstrate grossly normal gray-white differentiation. No mass effect or midline shift is seen. There is no evidence of fracture; visualized osseous structures are unremarkable in appearance. The orbits are within normal limits. The paranasal sinuses and mastoid air cells are well-aerated. No significant soft tissue abnormalities are seen. IMPRESSION: 1. No acute intracranial pathology seen on CT. 2. Mild to moderate cortical volume loss and scattered small vessel ischemic microangiopathy. 3. Chronic ischemic change at the left external capsule. These results were called by telephone at the time of interpretation on 06/16/2015 at 7:32 pm to Dr. Nicole Kindred, who verbally acknowledged these results. Electronically Signed   By: Garald Balding M.D.   On: 06/16/2015 19:35    Assessment: 82 y.o. male presenting following a likely episode of syncope, with subsequent development of mental status changes as well as right side weakness. TIA cannot be ruled out. As well, new onset focal seizure disorder cannot be ruled out.  Stroke Risk Factors - diabetes mellitus, hyperlipidemia and hypertension  Plan: 1. HgbA1c, fasting lipid panel 2. MRI, MRA  of the brain with contrast  3. PT consult, OT consult 4. Echocardiogram 5. Carotid dopplers 6. Prophylactic therapy-Antiplatelet med: Aspirin  7. Risk factor modification 8. Telemetry monitoring. 9. EEG, routine adult study, rule out new-onset  seizure disorder  C.R. Nicole Kindred, MD [facility] [phone]  06/16/2015, 7:53 PM

## 2015-06-17 ENCOUNTER — Inpatient Hospital Stay (HOSPITAL_COMMUNITY): Payer: Medicare Other

## 2015-06-17 ENCOUNTER — Observation Stay (HOSPITAL_COMMUNITY): Payer: Medicare Other

## 2015-06-17 DIAGNOSIS — I6330 Cerebral infarction due to thrombosis of unspecified cerebral artery: Secondary | ICD-10-CM | POA: Insufficient documentation

## 2015-06-17 DIAGNOSIS — R6510 Systemic inflammatory response syndrome (SIRS) of non-infectious origin without acute organ dysfunction: Secondary | ICD-10-CM | POA: Diagnosis not present

## 2015-06-17 DIAGNOSIS — I639 Cerebral infarction, unspecified: Secondary | ICD-10-CM | POA: Diagnosis not present

## 2015-06-17 DIAGNOSIS — I63333 Cerebral infarction due to thrombosis of bilateral posterior cerebral arteries: Secondary | ICD-10-CM | POA: Diagnosis not present

## 2015-06-17 DIAGNOSIS — Z85828 Personal history of other malignant neoplasm of skin: Secondary | ICD-10-CM | POA: Diagnosis not present

## 2015-06-17 DIAGNOSIS — I2510 Atherosclerotic heart disease of native coronary artery without angina pectoris: Secondary | ICD-10-CM | POA: Diagnosis present

## 2015-06-17 DIAGNOSIS — I10 Essential (primary) hypertension: Secondary | ICD-10-CM | POA: Diagnosis not present

## 2015-06-17 DIAGNOSIS — Z87891 Personal history of nicotine dependence: Secondary | ICD-10-CM | POA: Diagnosis not present

## 2015-06-17 DIAGNOSIS — Z79899 Other long term (current) drug therapy: Secondary | ICD-10-CM | POA: Diagnosis not present

## 2015-06-17 DIAGNOSIS — N400 Enlarged prostate without lower urinary tract symptoms: Secondary | ICD-10-CM | POA: Diagnosis present

## 2015-06-17 DIAGNOSIS — Z955 Presence of coronary angioplasty implant and graft: Secondary | ICD-10-CM | POA: Diagnosis not present

## 2015-06-17 DIAGNOSIS — R55 Syncope and collapse: Secondary | ICD-10-CM | POA: Diagnosis not present

## 2015-06-17 DIAGNOSIS — I252 Old myocardial infarction: Secondary | ICD-10-CM | POA: Diagnosis not present

## 2015-06-17 DIAGNOSIS — I69393 Ataxia following cerebral infarction: Secondary | ICD-10-CM | POA: Diagnosis not present

## 2015-06-17 DIAGNOSIS — Z6829 Body mass index (BMI) 29.0-29.9, adult: Secondary | ICD-10-CM | POA: Diagnosis not present

## 2015-06-17 DIAGNOSIS — E871 Hypo-osmolality and hyponatremia: Secondary | ICD-10-CM | POA: Diagnosis present

## 2015-06-17 DIAGNOSIS — Z8673 Personal history of transient ischemic attack (TIA), and cerebral infarction without residual deficits: Secondary | ICD-10-CM | POA: Diagnosis not present

## 2015-06-17 DIAGNOSIS — N189 Chronic kidney disease, unspecified: Secondary | ICD-10-CM | POA: Diagnosis present

## 2015-06-17 DIAGNOSIS — R000 Tachycardia, unspecified: Secondary | ICD-10-CM | POA: Diagnosis not present

## 2015-06-17 DIAGNOSIS — R29810 Facial weakness: Secondary | ICD-10-CM | POA: Diagnosis present

## 2015-06-17 DIAGNOSIS — E1122 Type 2 diabetes mellitus with diabetic chronic kidney disease: Secondary | ICD-10-CM | POA: Diagnosis present

## 2015-06-17 DIAGNOSIS — R1319 Other dysphagia: Secondary | ICD-10-CM | POA: Diagnosis present

## 2015-06-17 DIAGNOSIS — E876 Hypokalemia: Secondary | ICD-10-CM | POA: Diagnosis not present

## 2015-06-17 DIAGNOSIS — I471 Supraventricular tachycardia: Secondary | ICD-10-CM | POA: Diagnosis not present

## 2015-06-17 DIAGNOSIS — R29701 NIHSS score 1: Secondary | ICD-10-CM | POA: Diagnosis present

## 2015-06-17 DIAGNOSIS — I69991 Dysphagia following unspecified cerebrovascular disease: Secondary | ICD-10-CM | POA: Diagnosis not present

## 2015-06-17 DIAGNOSIS — G8191 Hemiplegia, unspecified affecting right dominant side: Secondary | ICD-10-CM | POA: Diagnosis present

## 2015-06-17 DIAGNOSIS — I63233 Cerebral infarction due to unspecified occlusion or stenosis of bilateral carotid arteries: Secondary | ICD-10-CM | POA: Diagnosis not present

## 2015-06-17 DIAGNOSIS — N281 Cyst of kidney, acquired: Secondary | ICD-10-CM | POA: Diagnosis not present

## 2015-06-17 DIAGNOSIS — R269 Unspecified abnormalities of gait and mobility: Secondary | ICD-10-CM | POA: Diagnosis not present

## 2015-06-17 DIAGNOSIS — K219 Gastro-esophageal reflux disease without esophagitis: Secondary | ICD-10-CM | POA: Diagnosis present

## 2015-06-17 DIAGNOSIS — E785 Hyperlipidemia, unspecified: Secondary | ICD-10-CM | POA: Diagnosis not present

## 2015-06-17 DIAGNOSIS — I63332 Cerebral infarction due to thrombosis of left posterior cerebral artery: Secondary | ICD-10-CM | POA: Diagnosis not present

## 2015-06-17 DIAGNOSIS — Z7982 Long term (current) use of aspirin: Secondary | ICD-10-CM | POA: Diagnosis not present

## 2015-06-17 DIAGNOSIS — E1142 Type 2 diabetes mellitus with diabetic polyneuropathy: Secondary | ICD-10-CM | POA: Diagnosis not present

## 2015-06-17 DIAGNOSIS — E669 Obesity, unspecified: Secondary | ICD-10-CM | POA: Diagnosis present

## 2015-06-17 DIAGNOSIS — I69398 Other sequelae of cerebral infarction: Secondary | ICD-10-CM | POA: Diagnosis not present

## 2015-06-17 DIAGNOSIS — N179 Acute kidney failure, unspecified: Secondary | ICD-10-CM | POA: Diagnosis present

## 2015-06-17 DIAGNOSIS — I129 Hypertensive chronic kidney disease with stage 1 through stage 4 chronic kidney disease, or unspecified chronic kidney disease: Secondary | ICD-10-CM | POA: Diagnosis present

## 2015-06-17 DIAGNOSIS — I69898 Other sequelae of other cerebrovascular disease: Secondary | ICD-10-CM | POA: Diagnosis not present

## 2015-06-17 DIAGNOSIS — Z96642 Presence of left artificial hip joint: Secondary | ICD-10-CM | POA: Diagnosis present

## 2015-06-17 DIAGNOSIS — E118 Type 2 diabetes mellitus with unspecified complications: Secondary | ICD-10-CM | POA: Diagnosis not present

## 2015-06-17 LAB — LIPID PANEL
Cholesterol: 106 mg/dL (ref 0–200)
HDL: 34 mg/dL — ABNORMAL LOW (ref >40)
LDL Cholesterol: 53 mg/dL (ref 0–99)
Total CHOL/HDL Ratio: 3.1 RATIO
Triglycerides: 95 mg/dL (ref <150)
VLDL: 19 mg/dL (ref 0–40)

## 2015-06-17 LAB — BASIC METABOLIC PANEL
Anion gap: 10 (ref 5–15)
BUN: 12 mg/dL (ref 6–20)
CO2: 22 mmol/L (ref 22–32)
Calcium: 8.7 mg/dL — ABNORMAL LOW (ref 8.9–10.3)
Chloride: 103 mmol/L (ref 101–111)
Creatinine, Ser: 1.27 mg/dL — ABNORMAL HIGH (ref 0.61–1.24)
GFR calc Af Amer: 58 mL/min — ABNORMAL LOW (ref >60)
GFR calc non Af Amer: 50 mL/min — ABNORMAL LOW (ref >60)
Glucose, Bld: 103 mg/dL — ABNORMAL HIGH (ref 65–99)
Potassium: 3.5 mmol/L (ref 3.5–5.1)
Sodium: 135 mmol/L (ref 135–145)

## 2015-06-17 LAB — GLUCOSE, CAPILLARY
Glucose-Capillary: 96 mg/dL (ref 65–99)
Glucose-Capillary: 97 mg/dL (ref 65–99)
Glucose-Capillary: 133 mg/dL — ABNORMAL HIGH (ref 65–99)

## 2015-06-17 LAB — MRSA PCR SCREENING: MRSA by PCR: NEGATIVE

## 2015-06-17 LAB — ECHOCARDIOGRAM COMPLETE
Height: 67 in
Weight: 3033.53 oz

## 2015-06-17 LAB — TROPONIN I: Troponin I: <0.03 ng/mL (ref <0.031)

## 2015-06-17 MED ORDER — LORAZEPAM 2 MG/ML IJ SOLN
INTRAMUSCULAR | Status: AC
  Filled 2015-06-17: qty 1

## 2015-06-17 MED ORDER — ASPIRIN EC 325 MG PO TBEC
325.0000 mg | DELAYED_RELEASE_TABLET | Freq: Every day | ORAL | Status: DC
  Administered 2015-06-18 – 2015-06-20 (×3): 325 mg via ORAL
  Filled 2015-06-17 (×4): qty 1

## 2015-06-17 MED ORDER — ACETAMINOPHEN 500 MG PO TABS
500.0000 mg | ORAL_TABLET | ORAL | Status: DC | PRN
  Administered 2015-06-18 – 2015-06-20 (×3): 500 mg via ORAL
  Filled 2015-06-17 (×3): qty 1

## 2015-06-17 MED ORDER — IOPAMIDOL (ISOVUE-370) INJECTION 76%
INTRAVENOUS | Status: AC
  Filled 2015-06-17: qty 50

## 2015-06-17 MED ORDER — INSULIN ASPART 100 UNIT/ML ~~LOC~~ SOLN: 0.0000 [IU] | Freq: Three times a day (TID) | SUBCUTANEOUS | Status: DC

## 2015-06-17 MED ORDER — LORAZEPAM 2 MG/ML IJ SOLN
0.5000 mg | Freq: Once | INTRAMUSCULAR | Status: AC
  Administered 2015-06-17: 0.5 mg via INTRAVENOUS
  Filled 2015-06-17: qty 1

## 2015-06-17 MED ORDER — RESOURCE THICKENUP CLEAR PO POWD
ORAL | Status: DC | PRN
  Filled 2015-06-17: qty 125

## 2015-06-17 MED ORDER — LORAZEPAM 0.5 MG PO TABS
0.5000 mg | ORAL_TABLET | Freq: Once | ORAL | Status: DC
  Filled 2015-06-17: qty 1

## 2015-06-17 MED ORDER — CLOPIDOGREL BISULFATE 75 MG PO TABS
75.0000 mg | ORAL_TABLET | Freq: Every day | ORAL | Status: DC
  Administered 2015-06-17 – 2015-06-21 (×5): 75 mg via ORAL
  Filled 2015-06-17 (×5): qty 1

## 2015-06-17 MED ORDER — TRAMADOL HCL 50 MG PO TABS
50.0000 mg | ORAL_TABLET | Freq: Four times a day (QID) | ORAL | Status: DC | PRN
  Administered 2015-06-17 – 2015-06-18 (×2): 50 mg via ORAL
  Filled 2015-06-17 (×2): qty 1

## 2015-06-17 MED ORDER — IOPAMIDOL (ISOVUE-370) INJECTION 76%: 50.0000 mL | Freq: Once | INTRAVENOUS | Status: DC | PRN

## 2015-06-17 MED ORDER — LORAZEPAM 2 MG/ML IJ SOLN
1.0000 mg | Freq: Once | INTRAMUSCULAR | Status: DC
  Filled 2015-06-17: qty 1

## 2015-06-17 NOTE — Progress Notes (Signed)
  Echocardiogram 2D Echocardiogram has been performed.  Herbert Marquez 06/17/2015, 3:42 PM

## 2015-06-17 NOTE — Progress Notes (Signed)
Rapid Response Event Note Called to unit to evaluate patient just up from ED/ MRI. Overview: Patient admitted as CODE Stroke, and now presenting with mental status changes. Per family and beside RN, patient mental status change occurred upon arrival to the floor status post MRI at 2300.   Initial Focused Assessment: Upon assessment, patient found to be confused and thrashing in the bed. NIH of 9, progressively changed from Hunt of 1 early this evening.  Patient noted to have (Facial 1, R arm 2, R leg 1, limb ataxia 1, sensory 1, best language 1, dysarthria 1, inattention/extinction 1. Per RN patient stable prior to MRI and new onset of symptoms upon arrival to the floor.   Interventions: MD Nicole Kindred notified of updated patient condition and NIH score, CTA ordered.  Patient transported to CT via Probation officer and April RN Rapid ResponseTeam   Event Summary:   MD Nicole Kindred at bedside with family and update on patient condition   at  [place]    at          Kendra Opitz

## 2015-06-17 NOTE — Progress Notes (Signed)
Received report from off going RN, Kenton Kingfisher,  as patient was already in MRI with RRT. Patient returned to unit with RRT & Dr Nicole Kindred approximately [phone]. Order given by Dr Nicole Kindred to transfer pt to Neuro Stepdown [phone]) as wife @ bedside. Upon assessment by this writer, patient was awake with garbled speech. Knew that he was in the hospital @ [facility] with wife @ bed side. Disoriented to tme and situation. Hand grip weak on left,but unable to squeeze with right hand. Strength & resistance present on left foot, but not right. Pupil 2 mm in left eye and reactive to light. Right eye  Pupil 2 mm & not reactive to light. Report given to [facility] on 3300 and patient brought to 3300 with wife @ bedside.

## 2015-06-17 NOTE — Evaluation (Signed)
Clinical/Bedside Swallow Evaluation Patient Details  Name: Herbert Marquez MRN: [id number] Date of Birth: [birth year]  Today's Date: 06/17/2015 Time: SLP Start Time (ACUTE ONLY): 1356 SLP Stop Time (ACUTE ONLY): 1418 SLP Time Calculation (min) (ACUTE ONLY): 22 min  Past Medical History:  Past Medical History  Diagnosis Date  . Coronary artery disease   . Hyperlipidemia   . Hypertension   . BPH (benign prostatic hypertrophy)   . TIA (transient ischemic attack)     Approximately 6 weeks post-cardiac catheterization.   . MI (myocardial infarction) ([place]) 10/2004  . Type II diabetes mellitus ([place])     "prediabetic; lost alot of weight; not diabetic now" (06/16/2015)  . GERD (gastroesophageal reflux disease)   . Arthritis     "pretty much all over"   . Basal cell carcinoma     "several burned off his body, face, head"   Past Surgical History:  Past Surgical History  Procedure Laterality Date  . Coronary angioplasty with stent placement  10/2004    stenting x 2 to RCA  . Cataract extraction, bilateral    . Cardiovascular stress test  07/01/2007    EF 74%  . Hip arthroplasty  03/09/2011    Procedure: ARTHROPLASTY BIPOLAR HIP;  Surgeon: Mauri Pole;  Location: [facility]S;  Service: Orthopedics;  Laterality: Left;  . Hernia repair    . Laparoscopic incisional / umbilical / ventral hernia repair      "below his naval"   HPI:  Herbert Marquez is an 82 y.o. male with a history of hypertension, hyperlipidemia, TIA and myocardial infarction, who was brought to the emergency room following an episode of loss of consciousness at home. Episode occured at 3:30 PM 06/16/2014 (LKW). Initial evaluation ED was unremarkable. Patient subsequently developed weakness involving his right side with facial droop noticed as well as reduced strength of right extremities transiently. MRI confirmed L thalamic infarct. However, pt experienced neuro decline over night and there is question of extension.   Assessment /  Plan / Recommendation Clinical Impression  Pt demonstrates concern for delayed swallow initiation and discoordination of swallow response as pt has an occasional immediate cough with thin liquids when taking a larger size bolus. He is improved with cues for small sips, but does not carry over instruction on next trial. Recommend initiating a Dys 3/nectar thick diet to reduce risk and SLP will f/u for tolerance and upgrade as pt is able. Discussed rationale with pt and daughter.     Aspiration Risk  Moderate aspiration risk    Diet Recommendation Dysphagia 3 (Mech soft);Thin liquid   Liquid Administration via: Cup Medication Administration: Whole meds with puree Supervision: Staff to assist with self feeding Compensations: Slow rate;Small sips/bites Postural Changes: Seated upright at 90 degrees    Other  Recommendations Oral Care Recommendations: Oral care BID   Follow up Recommendations  Inpatient Rehab    Frequency and Duration min 2x/week  2 weeks       Prognosis Prognosis for Safe Diet Advancement: Good      Swallow Study   General HPI: Herbert Marquez is an 82 y.o. male with a history of hypertension, hyperlipidemia, TIA and myocardial infarction, who was brought to the emergency room following an episode of loss of consciousness at home. Episode occured at 3:30 PM 06/16/2014 (LKW). Initial evaluation ED was unremarkable. Patient subsequently developed weakness involving his right side with facial droop noticed as well as reduced strength of right extremities transiently. MRI confirmed L  thalamic infarct. However, pt experienced neuro decline over night and there is question of extension. Type of Study: Bedside Swallow Evaluation Previous Swallow Assessment: none Diet Prior to this Study: NPO Temperature Spikes Noted: No Respiratory Status: Room air History of Recent Intubation: No Behavior/Cognition: Alert;Cooperative;Pleasant mood Oral Cavity Assessment: Within Functional  Limits Oral Care Completed by SLP: No Oral Cavity - Dentition: Missing dentition Vision: Functional for self-feeding Self-Feeding Abilities: Needs assist Patient Positioning: Upright in bed Baseline Vocal Quality: Normal Volitional Cough: Strong Volitional Swallow: Able to elicit    Oral/Motor/Sensory Function Overall Oral Motor/Sensory Function: Mild impairment Facial ROM: Within Functional Limits Facial Symmetry: Abnormal symmetry right Facial Strength: Within Functional Limits Facial Sensation: Within Functional Limits Lingual ROM: Within Functional Limits Lingual Symmetry: Within Functional Limits Lingual Strength: Within Functional Limits Lingual Sensation: Within Functional Limits Velum: Within Functional Limits Mandible: Within Functional Limits   Ice Chips Ice chips: Not tested   Thin Liquid Thin Liquid: Impaired Presentation: Cup;Straw;Self Fed Pharyngeal  Phase Impairments: Suspected delayed Swallow;Cough - Immediate    Nectar Thick Nectar Thick Liquid: Not tested   Honey Thick Honey Thick Liquid: Not tested   Puree Puree: Within functional limits Presentation: Spoon   Solid   GO   Solid: Within functional limits       [facility], [place] CCC-SLP [phone]  Lynann Beaver 06/17/2015,2:29 PM

## 2015-06-17 NOTE — Progress Notes (Signed)
Patient ID: Herbert Marquez, male   DOB: [birth year], 82 y.o.   MRN: [id number]    PROGRESS NOTE    Herbert Marquez  [id number] DOB: October 05, 1927 DOA: 06/16/2015  PCP: Thressa Sheller, MD   Outpatient Specialists:   Brief Narrative:  82 y.o. male with history of hypertension, hyperlipidemia, diabetes, TIA and myocardial infarction presented for evaluation of syncope and transient right-sided weakness with mental status change. He did not receive IV t-PA due to deficits resolved.   Assessment & Plan:  Dominant left thalamic infarct thought to be secondary to small vessel disease source - pt remains confused and agitated, flaccid on the right side and appears to be worse than yesterday - MRI L thalamic infarct  - MRA head Occluded L PCA, high grade R P2 stenosis - MRA neck no high grade stenosis - CTA head occluded R V4 and L PCA at origine, R P3  - CTA neck <50% R ICA stenosis, Mod to high grade stenosis L ICA - 2D Echo pending  - EEG pending  - continue aspirin 81 mg daily - PT and OT pending - SLP --> dys III diet   Hypertension, essential  - reasonable inpatient control   Hyperlipidemia - Continue statin at discharge  Diabetes type II - A1C pending - on actos at home - will add SSI as well   Acute kidney injury - Cr still elevated but trending down - secondary to acute event - BMP In AM  Obesity - Body mass index is 29.69  DVT prophylaxis: Lovenox SQ Code Status: Full  Family Communication: Patient and wife at bedside  Disposition Plan: Needs PT/OT, possibly by 4/16  Consultants:   Neurology   PT/OT/SLP  Procedures:   None  Antimicrobials:   None   Subjective: More agitated this AM.  Objective: Filed Vitals:   06/16/15 2344 06/17/15 0336 06/17/15 0500 06/17/15 0822  BP: 168/77 117/66  104/57  Pulse: 60 77  61  Temp:  98.7 F (37.1 C)  98.1 F (36.7 C)  TempSrc:  Oral  Axillary  Resp:  17  19  Height:      Weight:   86 kg (189 lb  9.5 oz)   SpO2: 97% 97%  93%    Intake/Output Summary (Last 24 hours) at 06/17/15 1446 Last data filed at 06/17/15 0733  Gross per 24 hour  Intake      0 ml  Output    250 ml  Net   -250 ml   Filed Weights   06/16/15 1702 06/16/15 2304 06/17/15 0500  Weight: 83.915 kg (185 lb) 84.324 kg (185 lb 14.4 oz) 86 kg (189 lb 9.5 oz)    Examination:  General exam: Appears agitated, pulling lines  Respiratory system: Clear to auscultation. Diminished breath sounds at bases  Cardiovascular system: S1 & S2 heard, RRR. No gallops or clicks. No pedal edema. Gastrointestinal system: Abdomen is nondistended, soft and nontender. N Central nervous system: Alert, agitated, follows some commands, flaccid on the right side (RUE)  Data Reviewed: I have personally reviewed following labs and imaging studies  CBC:  Recent Labs Lab 06/16/15 1707 06/16/15 2037  WBC 6.5  --   NEUTROABS 4.0  --   HGB 12.5* 13.3  HCT 38.9* 39.0  MCV 97.3  --   PLT 299  --    Basic Metabolic Panel:  Recent Labs Lab 06/14/15 0901 06/16/15 1707 06/16/15 2037 06/17/15 0433  NA 135 132* 136 135  K 5.2 3.7 3.9  3.5  CL 100 101 99* 103  CO2 23 19*  --  22  GLUCOSE 100* 122* 96 103*  BUN 16 17 16 12   CREATININE 1.36* 1.46* 1.30* 1.27*  CALCIUM 9.4 8.9  --  8.7*   Coagulation Profile:  Recent Labs Lab 06/16/15 1707  INR 1.12   Cardiac Enzymes:  Recent Labs Lab 06/17/15 0157  TROPONINI <0.03   CBG:  Recent Labs Lab 06/16/15 1714 06/16/15 1902 06/16/15 2338 06/17/15 0623  GLUCAP 111* 111* 103* 96   Lipid Profile:  Recent Labs  06/17/15 0433  CHOL 106  HDL 34*  LDLCALC 53  TRIG 95  CHOLHDL 3.1   Urine analysis:    Component Value Date/Time   COLORURINE YELLOW 06/16/2015 2036   APPEARANCEUR CLEAR 06/16/2015 2036   LABSPEC 1.015 06/16/2015 2036   PHURINE 6.0 06/16/2015 2036   GLUCOSEU NEGATIVE 06/16/2015 2036   HGBUR NEGATIVE 06/16/2015 2036   BILIRUBINUR NEGATIVE 06/16/2015  2036   KETONESUR NEGATIVE 06/16/2015 2036   PROTEINUR NEGATIVE 06/16/2015 2036   UROBILINOGEN 0.2 08/31/2011 1033   NITRITE NEGATIVE 06/16/2015 2036   LEUKOCYTESUR NEGATIVE 06/16/2015 2036    Recent Results (from the past 240 hour(s))  MRSA PCR Screening     Status: None   Collection Time: 06/17/15  6:00 AM  Result Value Ref Range Status   MRSA by PCR NEGATIVE NEGATIVE Final    Radiology Studies:  Ct Head Wo Contrast 06/16/2015   No acute intracranial pathology seen on CT. 2. Mild to moderate cortical volume loss and scattered small vessel ischemic microangiopathy. 3. Chronic ischemic change at the left external capsule. These results were called by telephone at the time of interpretation on 06/16/2015 at 7:32 pm to Dr. Nicole Kindred, who verbally acknowledged these results.   Ct Angio Neck W/cm &/or Wo/cm 06/17/2015  CTA NECK: Moderately motion degraded examination. Probable moderate to high-grade stenosis LEFT internal carotid artery origin, limited by motion. Less than 50% stenosis RIGHT internal carotid artery origin. CTA HEAD: Moderately motion degraded examination. Occluded RIGHT V4 segment. Occluded LEFT posterior cerebral artery at origin. Occluded proximal RIGHT P3 segment.   Mr Jeri Cos Wo Contrast 06/16/2015 Mildly motion degraded examination. Acute 8 x 12 mm LEFT thalamic infarct. Involutional changes. Mild to moderate chronic small vessel ischemic disease. MRA HEAD: Occluded LEFT posterior cerebral artery, given MRI findings, this is likely acute. High-grade stenosis RIGHT P2 segment, with presumed slow flow distally. MRA NECK: Mildly motion degraded examination. No high-grade stenosis. Motion and presumed atherosclerosis resulting in luminal irregularity.   Mr Brain Ltd W/o Cm 06/17/2015  Increased conspicuity of the left splenic lesion without significant change and size. 2. No new left-sided infarcts. 3. A 9 mm medial right thalamic infarct is better visualized on today's study. 4. New  7 mm medial right temporal lobe infarct suggesting ongoing disease.   Scheduled Meds: . aspirin EC  81 mg Oral Daily  . atorvastatin  40 mg Oral Daily  . enoxaparin (LOVENOX) injection  40 mg Subcutaneous Q24H  . LORazepam  1 mg Intravenous Once  . LORazepam  0.5 mg Oral Once  . pantoprazole  40 mg Oral Daily  . pioglitazone  15 mg Oral Daily  . sodium chloride flush  3 mL Intravenous Q12H   Continuous Infusions:    LOS: 0 days   Time spent: 20 minutes   Faye Ramsay, MD [facility] Pager [phone]  If 7PM-7AM, please contact night-coverage [email or web address] Password [email or web address] 06/17/2015, 2:46 PM

## 2015-06-17 NOTE — Care Management Note (Signed)
Case Management Note  Patient Details  Name: Herbert Marquez MRN: [id number] Date of Birth: [birth year]  Subjective/Objective:     Patient is from home with spouse, with syncopal episode, found to have a infarct, await pt eval.  NCM will cont to follow for dc needs.                Action/Plan:   Expected Discharge Date:                  Expected Discharge Plan:     In-House Referral:     Discharge planning Services  CM Consult  Post Acute Care Choice:    Choice offered to:     DME Arranged:    DME Agency:     HH Arranged:    HH Agency:     Status of Service:  In process, will continue to follow  Medicare Important Message Given:    Date Medicare IM Given:    Medicare IM give by:    Date Additional Medicare IM Given:    Additional Medicare Important Message give by:     If discussed at [place] of Stay Meetings, dates discussed:    Additional Comments:  Zenon Mayo, RN 06/17/2015, 1:55 PM

## 2015-06-17 NOTE — Progress Notes (Signed)
EEG Completed; Results Pending  

## 2015-06-17 NOTE — Progress Notes (Signed)
PT Cancellation Note  Patient Details Name: Herbert Marquez MRN: [id number] DOB: Aug 31, 1927   Cancelled Treatment:    Reason Eval/Treat Not Completed: Patient at procedure or test/unavailable (Attempted to see pt x2; Currently having EEG performed).  PT will continue to follow acutely.  Collie Siad PT, DPT  Pager: [phone] Phone: [phone] 06/17/2015, 2:36 PM

## 2015-06-17 NOTE — Progress Notes (Addendum)
STROKE TEAM PROGRESS NOTE   HISTORY OF PRESENT ILLNESS Herbert Marquez is an 82 y.o. male with a history of hypertension, hyperlipidemia, TIA and myocardial infarction, who was brought to the emergency room following an episode of loss of consciousness at home. No seizure activity was described. Episode occurred at 3:30 PM 06/16/2014 (LKW) this afternoon. Initial evaluation ED was unremarkable. Patient subsequently developed weakness involving his right side with facial droop noticed as well as reduced strength of right extremities transiently. Patient also reportedly at one point had a blank stare and was unresponsive for at least 1 minute. He also had repetitive movements of his right upper extremity. CT scan of his head showed no acute intracranial abnormality. NIH stroke score at the time of this evaluation was 1, with residual sensory changes involving right extremities. Been taking aspirin for antiplatelet therapy. Patient was not administered IV t-PA secondary to deficits resolved. He was admitted for further evaluation and treatment.   SUBJECTIVE (INTERVAL HISTORY) His wife is at the bedside.  Overnight he had neuro worsening with confusion and AMS and had additional imaging of CTA head and neck which showed no LVO. This am he was sleepy and drowsy, difficulty to arouse with word salad and right UE and LE weakness and questionable right neglect. However, minutes later, he was more awake alert, able to answer questions and follows commands, orientated and no neglect. will do MRI limited study and EEG to rule out extension of stroke or seizure.   OBJECTIVE Temp:  [97.7 F (36.5 C)-98.7 F (37.1 C)] 98.1 F (36.7 C) (04/14 0822) Pulse Rate:  [53-77] 61 (04/14 0822) Cardiac Rhythm:  [-] Heart block;Bundle branch block (04/14 0700) Resp:  [13-19] 19 (04/14 0822) BP: (104-168)/(57-113) 104/57 mmHg (04/14 0822) SpO2:  [92 %-100 %] 93 % (04/14 0822) Weight:  [83.915 kg (185 lb)-86 kg (189 lb 9.5  oz)] 86 kg (189 lb 9.5 oz) (04/14 0500)  CBC:   Recent Labs Lab 06/16/15 1707 06/16/15 2037  WBC 6.5  --   NEUTROABS 4.0  --   HGB 12.5* 13.3  HCT 38.9* 39.0  MCV 97.3  --   PLT 299  --     Basic Metabolic Panel:   Recent Labs Lab 06/16/15 1707 06/16/15 2037 06/17/15 0433  NA 132* 136 135  K 3.7 3.9 3.5  CL 101 99* 103  CO2 19*  --  22  GLUCOSE 122* 96 103*  BUN 17 16 12   CREATININE 1.46* 1.30* 1.27*  CALCIUM 8.9  --  8.7*    Lipid Panel:     Component Value Date/Time   CHOL 106 06/17/2015 0433   TRIG 95 06/17/2015 0433   HDL 34* 06/17/2015 0433   CHOLHDL 3.1 06/17/2015 0433   VLDL 19 06/17/2015 0433   LDLCALC 53 06/17/2015 0433   HgbA1c:  Lab Results  Component Value Date   HGBA1C 5.8* 08/31/2011   Urine Drug Screen:     Component Value Date/Time   LABOPIA NONE DETECTED 06/16/2015 2037   COCAINSCRNUR NONE DETECTED 06/16/2015 2037   LABBENZ NONE DETECTED 06/16/2015 2037   AMPHETMU NONE DETECTED 06/16/2015 2037   THCU NONE DETECTED 06/16/2015 2037   LABBARB NONE DETECTED 06/16/2015 2037      IMAGING I have personally reviewed the radiological images below and agree with the radiology interpretations. Blue text is my interpretation.  Ct Head Wo Contrast 06/16/2015  1. No acute intracranial pathology seen on CT. 2. Mild to moderate cortical volume loss and  scattered small vessel ischemic microangiopathy. 3. Chronic ischemic change at the left external capsule.   CTA NECK 06/17/2015    Moderately motion degraded examination. Probable moderate to high-grade stenosis LEFT internal carotid artery origin, limited by motion. Less than 50% stenosis RIGHT internal carotid artery origin. There is no left ICA stenosis by my interpretation   CTA HEAD 06/17/2015    Moderately motion degraded examination. Occluded RIGHT V4 segment. Occluded LEFT posterior cerebral artery at origin. Occluded proximal RIGHT P3 segment.   MRI HEAD 06/16/2015  Mildly motion degraded  examination. Acute 8 x 12 mm LEFT thalamic infarct. Involutional changes. Mild to moderate chronic small vessel ischemic disease.   MRA HEAD 06/16/2015  Occluded LEFT posterior cerebral artery, given MRI findings, this is likely acute. High-grade stenosis RIGHT P2 segment, with presumed slow flow distally.   MRA NECK 06/16/2015   Mildly motion degraded examination. No high-grade stenosis. Motion and presumed atherosclerosis resulting in luminal irregularity.   MRI brain limited study 06/16/15 1. Increased conspicuity of the left splenic lesion without significant change and size. 2. No new left-sided infarcts. 3. A 9 mm medial right thalamic infarct is better visualized on today's study. 4. New 7 mm medial right temporal lobe infarct suggesting ongoing disease.  EEG - pending  TTE - pending   PHYSICAL EXAM  Temp:  [97.7 F (36.5 C)-98.7 F (37.1 C)] 98.1 F (36.7 C) (04/14 0822) Pulse Rate:  [53-77] 61 (04/14 0822) Resp:  [13-19] 19 (04/14 0822) BP: (104-168)/(57-113) 104/57 mmHg (04/14 0822) SpO2:  [92 %-100 %] 93 % (04/14 0822) Weight:  [185 lb (83.915 kg)-189 lb 9.5 oz (86 kg)] 189 lb 9.5 oz (86 kg) (04/14 0500)  General - Well nourished, well developed, initially sleepy drowsy but later more awake alert.  Ophthalmologic - Fundi not visualized due to noncooperation.  Cardiovascular - Regular rate and rhythm.  Mental Status -  Initially drowsy sleepy and know his age but not orientated to person, time or place. Not answer questions or following commands with questionable right side neglect. However, a few minutes later with nurse checking on him too, he became more awake alert, orientated to name, age, person, time and place. Answer questions appropriately, and follows simple commands. No neglect. Naming 3/4 and intact repetition.   Cranial Nerves II - XII - II - Visual field intact OU. III, IV, VI - Extraocular movements intact. V - Facial sensation intact bilaterally. VII  - Facial movement intact bilaterally. VIII - Hard of hearing & vestibular intact bilaterally. X - Palate elevates symmetrically. XI - Chin turning & shoulder shrug intact bilaterally. XII - Tongue protrusion intact.  Motor Strength - The patient's strength was 3/5 RUE and RLE, 5/5 LUE and LLE.  Bulk was normal and fasciculations were absent.   Motor Tone - Muscle tone was assessed at the neck and appendages and was normal.  Reflexes - The patient's reflexes were 1+ in all extremities and he had no pathological reflexes.  Sensory - Light touch, temperature/pinprick not cooperative on exam.    Coordination - not cooperative on exam.  Tremor was absent.  Gait and Station - not tested.   ASSESSMENT/PLAN Herbert Marquez is a 82 y.o. male with history of hypertension, hyperlipidemia, diabetes, TIA and myocardial infarction presenting with syncope and transient right-sided weakness with mental status change. He did not receive IV t-PA due to deficits resolved. Overnight, he has fluctuation of neuro symptoms.   Stroke:  Initial left thalamic infarct and then  bilateral PCA infarcts on repeated MRI thought to be secondary to large vessel disease of b/l PCA occlusion and high grade stenosis. Fluctuating symptoms concerning for seizure.  Resultant  R sided weakness worse than yesterday   MRI  L thalamic infact  Repeat MRI showed b/l thalamic infarcts with right mesial temporal infarct  MRA head  Occluded L PCA, high grade R P2 stenosis  MRA neck no high grade stenosis  CTA head occluded R V4 and L PCA at origine, R P3   CTA neck <50% R ICA stenosis.  2D Echo  pending   EEG pending   Due to bilateral involvement, recommend 30 day cardiac event monitoring as outpt.   LDL 53  HgbA1c pending  Lovenox 40 mg sq daily for VTE prophylaxis Diet Heart Room service appropriate?: Yes; Fluid consistency:: Thin  aspirin 81 mg daily prior to admission, now on aspirin 81 mg daily. Given  symptomatic severe PCA occlusion/high grade stenosis, will recommend dual antiplatelet with ASA and plavix for 3 months. And then plavix alone.   Ongoing aggressive stroke risk factor management  Therapy recommendations:  pending (ordered PT and OT)  Disposition:  pending   ? Seizure  Fluctuating symptoms  Brief blank staring PTA with RUE repetitive movment  MRI showed b/l thalamic infarcts - could be seizure focus  EEG pending  May consider AEDs if recurrent symptoms.  Hypertension  Stable this am  Elevated yesterday up to 162/113  Permissive hypertension (OK if < 220/120) but gradually toward BP goal in 5-7 days  Due to severe PCA stenosis/occlusion, recommend BP goal 130-150  Avoid hypotension  Hyperlipidemia  Home meds:  lipitor 40, resumed in hospital  LDL 53, goal < 70  Continue statin at discharge  Diabetes  HgbA1c pending , goal < 7.0  SSI  CBG monitoring  Other Stroke Risk Factors  Advanced age  Former Cigarette smoker, quit smoking 44 years ago   ETOH use  Hx TIA  6 weeks post cardiac cath  Coronary artery disease - MI, PIC w/ stent 2006  Other Active Problems  BPH  CKD  Hospital day # 1  Rosalin Hawking, MD PhD Stroke Neurology 06/17/2015 3:08 PM   To contact Stroke Continuity provider, please refer to [email or web address] After hours, contact General Neurology

## 2015-06-17 NOTE — Procedures (Signed)
History: 82 yo M with previous stroke with AMS  Sedation: None  Technique: This is a 21 channel routine scalp EEG performed at the bedside with bipolar and monopolar montages arranged in accordance to the international 10/20 system of electrode placement. One channel was dedicated to EKG recording.    Background: Even during periods of maximal wakefullness, there is mild irregular delta activity. There is a posterior dominant rhythm of 8 Hz that is symmetric and reactive to eye opening/closure. [name]  Photic stimulation: Physiologic driving is not performed  EEG Abnormalities: 1) Mild generalized irregular slow activity  Clinical Interpretation: This EEG is consistent with a mild generalized non-specific cerebral dysfunction(encephalopathy). There was no seizure or seizure predisposition recorded on this study.   Roland Rack, MD [facility] [phone]  If 7pm- 7am, please page neurology on call as listed in [place].

## 2015-06-18 LAB — BASIC METABOLIC PANEL
Anion gap: 11 (ref 5–15)
BUN: 11 mg/dL (ref 6–20)
CO2: 20 mmol/L — ABNORMAL LOW (ref 22–32)
Calcium: 8.9 mg/dL (ref 8.9–10.3)
Chloride: 105 mmol/L (ref 101–111)
Creatinine, Ser: 0.95 mg/dL (ref 0.61–1.24)
GFR calc Af Amer: >60 mL/min (ref >60)
GFR calc non Af Amer: >60 mL/min (ref >60)
Glucose, Bld: 123 mg/dL — ABNORMAL HIGH (ref 65–99)
Potassium: 3.7 mmol/L (ref 3.5–5.1)
Sodium: 136 mmol/L (ref 135–145)

## 2015-06-18 LAB — CBC
HCT: 36.3 % — ABNORMAL LOW (ref 39.0–52.0)
Hemoglobin: 11.8 g/dL — ABNORMAL LOW (ref 13.0–17.0)
MCH: 31.1 pg (ref 26.0–34.0)
MCHC: 32.5 g/dL (ref 30.0–36.0)
MCV: 95.8 fL (ref 78.0–100.0)
Platelets: 263 10*3/uL (ref 150–400)
RBC: 3.79 MIL/uL — ABNORMAL LOW (ref 4.22–5.81)
RDW: 14.8 % (ref 11.5–15.5)
WBC: 10.8 10*3/uL — ABNORMAL HIGH (ref 4.0–10.5)

## 2015-06-18 LAB — GLUCOSE, CAPILLARY
Glucose-Capillary: 111 mg/dL — ABNORMAL HIGH (ref 65–99)
Glucose-Capillary: 114 mg/dL — ABNORMAL HIGH (ref 65–99)
Glucose-Capillary: 110 mg/dL — ABNORMAL HIGH (ref 65–99)

## 2015-06-18 LAB — HEMOGLOBIN A1C
Hgb A1c MFr Bld: 5.6 % (ref 4.8–5.6)
Mean Plasma Glucose: 114 mg/dL

## 2015-06-18 MED ORDER — ATORVASTATIN CALCIUM 80 MG PO TABS
80.0000 mg | ORAL_TABLET | Freq: Every day | ORAL | Status: DC
  Administered 2015-06-18 – 2015-06-21 (×4): 80 mg via ORAL
  Filled 2015-06-18 (×4): qty 1

## 2015-06-18 NOTE — Progress Notes (Signed)
Speech Language Pathology Treatment: Dysphagia  Patient Details Name: Herbert Marquez MRN: [id number] DOB: 15-Jul-1927 Today's Date: 06/18/2015 Time: [id number] SLP Time Calculation (min) (ACUTE ONLY): 11 min  Assessment / Plan / Recommendation Clinical Impression  SLP followed up for upgraded PO trials this date. Family reports pt difficulty with medicines whole in puree and worrisome about mechanical soft meats of current diet. Pt without overt signs or symptoms of aspiration with thin liquids via cup and straw sip. Recommend diet modification to thin liquids and dysphagia 2 (chopped) consistency with medicines crushed in puree and full supervision with all PO. Pt continues to be at increased risk for aspiration given cognitive deficits. SLP to closely monitor for diet tolerance.    HPI HPI: Herbert Marquez is an 82 y.o. male with a history of hypertension, hyperlipidemia, TIA and myocardial infarction, who was brought to the emergency room following an episode of loss of consciousness at home. Episode occured at 3:30 PM 06/16/2014 (LKW). Initial evaluation ED was unremarkable. Patient subsequently developed weakness involving his right side with facial droop noticed as well as reduced strength of right extremities transiently. MRI confirmed L thalamic infarct. Pt experienced neuro decline over night and there is question of extension. MRI of brain 06-17-15 revealed new right temporal lobe infarct since last ST visit.         SLP Plan  Continue with current plan of care     Recommendations  Diet recommendations: Thin liquid;Dysphagia 2 (fine chop) Liquids provided via: Cup;Straw Medication Administration: Crushed with puree Supervision: Full supervision/cueing for compensatory strategies;Staff to assist with self feeding Compensations: Slow rate;Small sips/bites Postural Changes and/or Swallow Maneuvers: Seated upright 90 degrees;Upright 30-60 min after meal             Oral Care  Recommendations: Oral care BID Follow up Recommendations: Inpatient Rehab Plan: Continue with current plan of care     [place] MA, [place] Pathologist     Levi Aland 06/18/2015, 10:36 AM

## 2015-06-18 NOTE — Evaluation (Signed)
Physical Therapy Evaluation Patient Details Name: Herbert Marquez MRN: [id number] DOB: October 23, 1927 Today's Date: 06/18/2015   History of Present Illness  Patient is an 82 yo male admitted 06/16/15 after syncopal episode with transient Rt weakness and AMS.  Patient then developed more significant symptoms of Rt-sided weakness, confusion.  MRI showed Lt thalamic infarct, and repeat MRI showed bilateral PCA infarcts.    PMH:  HTN, DM, TIA, MI    Clinical Impression  Patient presents with problems listed below.  Will benefit from acute PT to maximize functional mobility prior to discharge.  Patient independent pta.  Recommend Inpatient Rehab consult with goal to reach optimal functional level to facilitate return home with wife.    Follow Up Recommendations CIR;Supervision/Assistance - 24 hour    Equipment Recommendations  Wheelchair (measurements PT);Wheelchair cushion (measurements PT)    Recommendations for Other Services Rehab consult     Precautions / Restrictions Precautions Precautions: Fall Restrictions Weight Bearing Restrictions: No      Mobility  Bed Mobility Overal bed mobility: Needs Assistance Bed Mobility: Rolling;Sidelying to Sit;Sit to Sidelying Rolling: Min assist Sidelying to sit: Mod assist;+2 for physical assistance     Sit to sidelying: Mod assist;+2 for physical assistance General bed mobility comments: Verbal cues for technique - step-by-step.  Patient with decreased control of RUE when reaching for rail to roll.  Assist to bring hips onto side.  Assist to bring trunk to sitting position.  Assist to scoot hips to EOB with use of bed pad.  Patient unable to functionally use RUE to assist - decreased control.  Patient able to maintain static sitting balance with min guard assist.  Required min to mod assist to maintain balance with dynamic activity - falls posteriorly.  Returned to sidelying with +2 assist to control trunk and bring LE's onto  bed.  Transfers Overall transfer level: Needs assistance Equipment used: 2 person hand held assist Transfers: Sit to/from Stand Sit to Stand: Max assist;+2 physical assistance         General transfer comment: Verbal cues to stand.  Required +2 max assist to initiate sit to stand.  Support provided to RUE and Rt knee.  Patient unable to reach fully upright position.  Difficulty keeping body in midline.  Difficulty supporting weight on RLE.  Ambulation/Gait             General Gait Details: Unable  Financial trader Rankin (Stroke Patients Only) Modified Rankin (Stroke Patients Only) Pre-Morbid Rankin Score: No significant disability Modified Rankin: Severe disability     Balance Overall balance assessment: Needs assistance Sitting-balance support: Single extremity supported;Feet supported Sitting balance-Leahy Scale: Fair   Postural control: Posterior lean Standing balance support: Bilateral upper extremity supported Standing balance-Leahy Scale: Zero Standing balance comment: Unable to reach upright stance.                             Pertinent Vitals/Pain Pain Assessment: Faces Faces Pain Scale: Hurts little more Pain Location: Rt knee Pain Descriptors / Indicators: Aching;Sore Pain Intervention(s): Monitored during session;Repositioned    Home Living Family/patient expects to be discharged to:: Private residence Living Arrangements: Spouse/significant other Available Help at Discharge: Family;Available 24 hours/day Type of Home: House Home Access: Stairs to enter Entrance Stairs-Rails: Right Entrance Stairs-Number of Steps: 2 Home Layout: One level Home Equipment: Walker - 2 wheels;Cane - single  point;Bedside commode;Shower seat      Prior Function Level of Independence: Independent with assistive device(s)         Comments: Uses cane for gait.     Hand Dominance        Extremity/Trunk  Assessment   Upper Extremity Assessment: RUE deficits/detail;Defer to OT evaluation RUE Deficits / Details: Difficulty using RUE functionally         Lower Extremity Assessment: RLE deficits/detail RLE Deficits / Details: Strength grossly 3-/5.  Decreased coordination.  Patient with edema and pain in Rt knee (chronic)       Communication   Communication: No difficulties  Cognition Arousal/Alertness: Awake/alert Behavior During Therapy: WFL for tasks assessed/performed Overall Cognitive Status: Within Functional Limits for tasks assessed                      General Comments      Exercises        Assessment/Plan    PT Assessment Patient needs continued PT services  PT Diagnosis Difficulty walking;Hemiplegia dominant side;Generalized weakness   PT Problem List Decreased strength;Decreased activity tolerance;Decreased balance;Decreased mobility;Decreased coordination;Decreased knowledge of use of DME;Impaired sensation;Impaired tone  PT Treatment Interventions DME instruction;Gait training;Functional mobility training;Therapeutic activities;Balance training;Neuromuscular re-education;Patient/family education   PT Goals (Current goals can be found in the Care Plan section) Acute Rehab PT Goals Patient Stated Goal: To walk PT Goal Formulation: With patient/family Time For Goal Achievement: 07/02/15 Potential to Achieve Goals: Good    Frequency Min 4X/week   Barriers to discharge        Co-evaluation               End of Session Equipment Utilized During Treatment: Gait belt Activity Tolerance: Patient limited by fatigue Patient left: in bed;with call bell/phone within reach;with bed alarm set;with family/visitor present Nurse Communication: Mobility status         Time: [id number] PT Time Calculation (min) (ACUTE ONLY): 18 min   Charges:   PT Evaluation $PT Eval High Complexity: 1 Procedure     PT G CodesDespina Pole Jul 11, 2015,  8:43 PM Carita Pian. Sanjuana Kava, [place] Pager [phone]

## 2015-06-18 NOTE — Progress Notes (Addendum)
Patient ID: Herbert Marquez, male   DOB: 1928/02/22, 82 y.o.   MRN: [id number]    PROGRESS NOTE    Herbert Marquez  [id number] DOB: 09/10/1927 DOA: 06/16/2015  PCP: Thressa Sheller, MD   Outpatient Specialists:   Brief Narrative:  82 y.o. male with history of hypertension, hyperlipidemia, diabetes, TIA and myocardial infarction presented for evaluation of syncope and transient right-sided weakness with mental status change. He did not receive IV t-PA due to deficits resolved.   Assessment & Plan:  Dominant left thalamic infarct thought to be secondary to small vessel disease source - improved function in the RUE, pt more clear this AM and calm - MRI L thalamic infarct  - MRA head Occluded L PCA, high grade R P2 stenosis - MRA neck no high grade stenosis - CTA head occluded R V4 and L PCA at origine, R P3  - CTA neck <50% R ICA stenosis, Mod to high grade stenosis L ICA - 2D Echo with normal EF  - EEG with no evidence of seizures  - continue aspirin 81 mg daily - PT and OT pending - SLP --> dys III diet, reassess today   Hypertension, essential  - reasonable inpatient control   Hyperlipidemia - Continue statin at discharge  Diabetes type II - A1C 5.6 - continue Actos - continue SSI   Acute kidney injury - secondary to acute event, resolved  - BMP In AM  Obesity - Body mass index is 29.69  DVT prophylaxis: Lovenox SQ Code Status: Full  Family Communication: Patient and wife at bedside  Disposition Plan: Needs PT/OT, possibly by 4/16  Consultants:   Neurology   PT/OT/SLP  Procedures:   None  Antimicrobials:   None  Subjective: Calm and alert this AM.   Objective: Filed Vitals:   06/17/15 2308 06/18/15 0400 06/18/15 0830 06/18/15 1226  BP: 121/79 127/69 119/71 121/75  Pulse: 85 71 65 75  Temp: 98 F (36.7 C) 98.6 F (37 C)  97.4 F (36.3 C)  TempSrc: Oral Oral  Oral  Resp: 16 20 19 19   Height:      Weight:      SpO2: 96% 98% 99% 97%     Intake/Output Summary (Last 24 hours) at 06/18/15 1421 Last data filed at 06/18/15 1300  Gross per 24 hour  Intake    480 ml  Output    525 ml  Net    -45 ml   Filed Weights   06/16/15 1702 06/16/15 2304 06/17/15 0500  Weight: 83.915 kg (185 lb) 84.324 kg (185 lb 14.4 oz) 86 kg (189 lb 9.5 oz)    Examination:  General exam: Appears calm and NAD Respiratory system: Clear to auscultation. Diminished breath sounds at bases  Cardiovascular system: S1 & S2 heard, RRR. No gallops or clicks. No pedal edema. Gastrointestinal system: Abdomen is nondistended, soft and nontender. N Central nervous system: Alert, calm, still difficulty with RUE control but improving   Data Reviewed: I have personally reviewed following labs and imaging studies  CBC:  Recent Labs Lab 06/16/15 1707 06/16/15 2037 06/18/15 0609  WBC 6.5  --  10.8*  NEUTROABS 4.0  --   --   HGB 12.5* 13.3 11.8*  HCT 38.9* 39.0 36.3*  MCV 97.3  --  95.8  PLT 299  --  99991111   Basic Metabolic Panel:  Recent Labs Lab 06/14/15 0901 06/16/15 1707 06/16/15 2037 06/17/15 0433 06/18/15 0609  NA 135 132* 136 135 136  K 5.2  3.7 3.9 3.5 3.7  CL 100 101 99* 103 105  CO2 23 19*  --  22 20*  GLUCOSE 100* 122* 96 103* 123*  BUN 16 17 16 12 11   CREATININE 1.36* 1.46* 1.30* 1.27* 0.95  CALCIUM 9.4 8.9  --  8.7* 8.9   Coagulation Profile:  Recent Labs Lab 06/16/15 1707  INR 1.12   Cardiac Enzymes:  Recent Labs Lab 06/17/15 0157  TROPONINI <0.03   CBG:  Recent Labs Lab 06/16/15 2338 06/17/15 0623 06/17/15 1717 06/17/15 2041 06/18/15 0824  GLUCAP 103* 96 97 133* 111*   Lipid Profile:  Recent Labs  06/17/15 0433  CHOL 106  HDL 34*  LDLCALC 53  TRIG 95  CHOLHDL 3.1   Urine analysis:    Component Value Date/Time   COLORURINE YELLOW 06/16/2015 2036   APPEARANCEUR CLEAR 06/16/2015 2036   LABSPEC 1.015 06/16/2015 2036   PHURINE 6.0 06/16/2015 2036   GLUCOSEU NEGATIVE 06/16/2015 2036   HGBUR  NEGATIVE 06/16/2015 2036   BILIRUBINUR NEGATIVE 06/16/2015 2036   KETONESUR NEGATIVE 06/16/2015 2036   PROTEINUR NEGATIVE 06/16/2015 2036   UROBILINOGEN 0.2 08/31/2011 1033   NITRITE NEGATIVE 06/16/2015 2036   LEUKOCYTESUR NEGATIVE 06/16/2015 2036    Recent Results (from the past 240 hour(s))  MRSA PCR Screening     Status: None   Collection Time: 06/17/15  6:00 AM  Result Value Ref Range Status   MRSA by PCR NEGATIVE NEGATIVE Final    Radiology Studies:  Ct Head Wo Contrast 06/16/2015   No acute intracranial pathology seen on CT. 2. Mild to moderate cortical volume loss and scattered small vessel ischemic microangiopathy. 3. Chronic ischemic change at the left external capsule. These results were called by telephone at the time of interpretation on 06/16/2015 at 7:32 pm to Dr. Nicole Kindred, who verbally acknowledged these results.   Ct Angio Neck W/cm &/or Wo/cm 06/17/2015  CTA NECK: Moderately motion degraded examination. Probable moderate to high-grade stenosis LEFT internal carotid artery origin, limited by motion. Less than 50% stenosis RIGHT internal carotid artery origin. CTA HEAD: Moderately motion degraded examination. Occluded RIGHT V4 segment. Occluded LEFT posterior cerebral artery at origin. Occluded proximal RIGHT P3 segment.   Mr Jeri Cos Wo Contrast 06/16/2015 Mildly motion degraded examination. Acute 8 x 12 mm LEFT thalamic infarct. Involutional changes. Mild to moderate chronic small vessel ischemic disease. MRA HEAD: Occluded LEFT posterior cerebral artery, given MRI findings, this is likely acute. High-grade stenosis RIGHT P2 segment, with presumed slow flow distally. MRA NECK: Mildly motion degraded examination. No high-grade stenosis. Motion and presumed atherosclerosis resulting in luminal irregularity.   Mr Brain Ltd W/o Cm 06/17/2015  Increased conspicuity of the left splenic lesion without significant change and size. 2. No new left-sided infarcts. 3. A 9 mm medial right  thalamic infarct is better visualized on today's study. 4. New 7 mm medial right temporal lobe infarct suggesting ongoing disease.   Scheduled Meds: . aspirin EC  325 mg Oral Daily  . atorvastatin  80 mg Oral Daily  . clopidogrel  75 mg Oral Daily  . enoxaparin (LOVENOX) injection  40 mg Subcutaneous Q24H  . insulin aspart  0-9 Units Subcutaneous TID WC  . LORazepam  1 mg Intravenous Once  . LORazepam  0.5 mg Oral Once  . pantoprazole  40 mg Oral Daily  . pioglitazone  15 mg Oral Daily  . sodium chloride flush  3 mL Intravenous Q12H   Continuous Infusions:    LOS: 1 day  Time spent: 20 minutes   Faye Ramsay, MD [facility] Pager [phone]  If 7PM-7AM, please contact night-coverage [email or web address] Password [email or web address] 06/18/2015, 2:21 PM

## 2015-06-18 NOTE — Progress Notes (Signed)
STROKE TEAM PROGRESS NOTE   HISTORY OF PRESENT ILLNESS Herbert Marquez is an 82 y.o. male with a history of hypertension, hyperlipidemia, TIA and myocardial infarction, who was brought to the emergency room following an episode of loss of consciousness at home. No seizure activity was described. Episode occurred at 3:30 PM 06/16/2014 ([facility]) this afternoon. Initial evaluation ED was unremarkable. Patient subsequently developed weakness involving his right side with facial droop noticed as well as reduced strength of right extremities transiently. Patient also reportedly at one point had a blank stare and was unresponsive for at least 1 minute. He also had repetitive movements of his right upper extremity. CT scan of his head showed no acute intracranial abnormality. NIH stroke score at the time of this evaluation was 1, with residual sensory changes involving right extremities. Been taking aspirin for antiplatelet therapy. Patient was not administered IV t-PA secondary to deficits resolved. He was admitted for further evaluation and treatment.   SUBJECTIVE (INTERVAL HISTORY) His wife is at the bedside.  Overnight he had neuro worsening with confusion and AMS and had additional imaging of CTA head and neck which showed no LVO. This am he was sleepy and drowsy, difficulty to arouse with word salad and right UE and LE weakness and questionable right neglect. However, minutes later, he was more awake alert, able to answer questions and follows commands, orientated and no neglect. will do MRI limited study and EEG to rule out extension of stroke or seizure.   OBJECTIVE Temp:  [97.6 F (36.4 C)-98.6 F (37 C)] 98.6 F (37 C) (04/15 0400) Pulse Rate:  [56-85] 65 (04/15 0830) Cardiac Rhythm:  [-] Normal sinus rhythm (04/15 0830) Resp:  [16-20] 19 (04/15 0830) BP: (119-137)/(67-79) 119/71 mmHg (04/15 0830) SpO2:  [96 %-99 %] 99 % (04/15 0830)  CBC:   Recent Labs Lab 06/16/15 1707 06/16/15 2037  06/18/15 0609  WBC 6.5  --  10.8*  NEUTROABS 4.0  --   --   HGB 12.5* 13.3 11.8*  HCT 38.9* 39.0 36.3*  MCV 97.3  --  95.8  PLT 299  --  99991111    Basic Metabolic Panel:   Recent Labs Lab 06/17/15 0433 06/18/15 0609  NA 135 136  K 3.5 3.7  CL 103 105  CO2 22 20*  GLUCOSE 103* 123*  BUN 12 11  CREATININE 1.27* 0.95  CALCIUM 8.7* 8.9    Lipid Panel:     Component Value Date/Time   CHOL 106 06/17/2015 0433   TRIG 95 06/17/2015 0433   HDL 34* 06/17/2015 0433   CHOLHDL 3.1 06/17/2015 0433   VLDL 19 06/17/2015 0433   LDLCALC 53 06/17/2015 0433   HgbA1c:  Lab Results  Component Value Date   HGBA1C 5.6 06/17/2015   Urine Drug Screen:     Component Value Date/Time   LABOPIA NONE DETECTED 06/16/2015 2037   COCAINSCRNUR NONE DETECTED 06/16/2015 2037   LABBENZ NONE DETECTED 06/16/2015 2037   AMPHETMU NONE DETECTED 06/16/2015 2037   THCU NONE DETECTED 06/16/2015 2037   LABBARB NONE DETECTED 06/16/2015 2037      IMAGING  I have personally reviewed the radiological images below and agree with the radiology interpretations. Blue text is my interpretation.  Ct Head Wo Contrast 06/16/2015  1. No acute intracranial pathology seen on CT. 2. Mild to moderate cortical volume loss and scattered small vessel ischemic microangiopathy. 3. Chronic ischemic change at the left external capsule.   CTA NECK 06/17/2015    Moderately  motion degraded examination. Probable moderate to high-grade stenosis LEFT internal carotid artery origin, limited by motion. Less than 50% stenosis RIGHT internal carotid artery origin. There is no left ICA stenosis by my interpretation   CTA HEAD 06/17/2015    Moderately motion degraded examination. Occluded RIGHT V4 segment. Occluded LEFT posterior cerebral artery at origin. Occluded proximal RIGHT P3 segment.   MRI HEAD 06/16/2015  Mildly motion degraded examination. Acute 8 x 12 mm LEFT thalamic infarct. Involutional changes. Mild to moderate chronic  small vessel ischemic disease.   MRA HEAD 06/16/2015  Occluded LEFT posterior cerebral artery, given MRI findings, this is likely acute. High-grade stenosis RIGHT P2 segment, with presumed slow flow distally.   MRA NECK 06/16/2015   Mildly motion degraded examination. No high-grade stenosis. Motion and presumed atherosclerosis resulting in luminal irregularity.   MRI brain limited study 06/16/15 1. Increased conspicuity of the left splenic lesion without significant change and size. 2. No new left-sided infarcts. 3. A 9 mm medial right thalamic infarct is better visualized on today's study. 4. New 7 mm medial right temporal lobe infarct suggesting ongoing disease.  EEG 06/17/2015 EEG Abnormalities: 1) Mild generalized irregular slow activity Clinical Interpretation: This EEG is consistent with a mild generalized non-specific cerebral dysfunction(encephalopathy). There was no seizure or seizure predisposition recorded on this study.   TTE  06/17/2015 Study Conclusions  Left ventricle: The cavity size was normal. Wall thickness was  normal. Systolic function was normal. The estimated ejection  fraction was in the range of 55% to 60%. Wall motion was normal;  there were no regional wall motion abnormalities. Doppler  parameters are consistent with abnormal left ventricular  relaxation (grade 1 diastolic dysfunction). - Aortic valve: Mildly calcified annulus. - Left atrium: The atrium was moderately dilated. - Right atrium: The atrium was mildly dilated.   PHYSICAL EXAM  Temp:  [97.6 F (36.4 C)-98.6 F (37 C)] 98.6 F (37 C) (04/15 0400) Pulse Rate:  [56-85] 65 (04/15 0830) Resp:  [16-20] 19 (04/15 0830) BP: (119-137)/(67-79) 119/71 mmHg (04/15 0830) SpO2:  [96 %-99 %] 99 % (04/15 0830)  General - Well nourished, well developed, initially sleepy drowsy but later more awake alert.  Ophthalmologic - Fundi not visualized due to noncooperation.  Cardiovascular - Regular  rate and rhythm.  Mental Status -  Initially drowsy sleepy and know his age but not orientated to person, time or place. Not answer questions or following commands with questionable right side neglect. However, a few minutes later with nurse checking on him too, he became more awake alert, orientated to name, age, person, time and place. Answer questions appropriately, and follows simple commands. No neglect. Naming 3/4 and intact repetition.   Cranial Nerves II - XII - II - Visual field intact OU. III, IV, VI - Extraocular movements intact. V - Facial sensation intact bilaterally. VII - Facial movement intact bilaterally. VIII - Hard of hearing & vestibular intact bilaterally. X - Palate elevates symmetrically. XI - Chin turning & shoulder shrug intact bilaterally. XII - Tongue protrusion intact.  Motor Strength - The patient's strength was 3/5 RUE and RLE, 5/5 LUE and LLE.  Bulk was normal and fasciculations were absent.   Motor Tone - Muscle tone was assessed at the neck and appendages and was normal.  Reflexes - The patient's reflexes were 1+ in all extremities and he had no pathological reflexes.  Sensory - Light touch, temperature/pinprick not cooperative on exam.    Coordination - not cooperative on exam.  Tremor was absent.  Gait and Station - not tested.   ASSESSMENT/PLAN Mr. GRADYN FEIK is a 82 y.o. male with history of hypertension, hyperlipidemia, diabetes, TIA and myocardial infarction presenting with syncope and transient right-sided weakness with mental status change. He did not receive IV t-PA due to deficits resolved. Overnight, he has fluctuation of neuro symptoms.   Stroke:  Initial left thalamic infarct and then bilateral PCA infarcts on repeated MRI thought to be secondary to large vessel disease of b/l PCA occlusion and high grade stenosis. Fluctuating symptoms concerning for seizure.  Resultant  R sided weakness worse than yesterday   MRI  L thalamic  infact  Repeat MRI showed b/l thalamic infarcts with right mesial temporal infarct  MRA head  Occluded L PCA, high grade R P2 stenosis  MRA neck no high grade stenosis  CTA head occluded R V4 and L PCA at origine, R P3   CTA neck <50% R ICA stenosis.  2D Echo - EF 55-60%. No cardiac source of emboli identified.[name]  EEG - Mild generalized irregular slow activity  Due to bilateral involvement, recommend 30 day cardiac event monitoring as outpt.   LDL 53 - Due to high-grade stenosis use high-dose statin - Lipitor 80 mg daily  HgbA1c 5.6   Lovenox 40 mg sq daily for VTE prophylaxis DIET DYS 3 Room service appropriate?: Yes; Fluid consistency:: Nectar Thick  aspirin 81 mg daily prior to admission, now on aspirin 81 mg daily. Given symptomatic severe PCA occlusion/high grade stenosis, will recommend dual antiplatelet with ASA and plavix for 3 months. And then plavix alone.   Ongoing aggressive stroke risk factor management  Therapy recommendations:  pending (ordered PT and OT)  Disposition:  pending   ? Seizure  Fluctuating symptoms  Brief blank staring PTA with RUE repetitive movment  MRI showed b/l thalamic infarcts - could be seizure focus  EEG Mild generalized irregular slow activity  May consider AEDs if recurrent symptoms.  Hypertension  Stable this am  Elevated yesterday up to 162/113  Permissive hypertension (OK if < 220/120) but gradually toward BP goal in 5-7 days  Due to severe PCA stenosis/occlusion, recommend BP goal 130-150  Avoid hypotension  Hyperlipidemia  Home meds:  lipitor 40 mg daily prior to admission - increased to 80 mg daily.  LDL 53, goal < 70  Continue statin at discharge  Diabetes  HgbA1c 5.6,  goal < 7.0  SSI  CBG monitoring  Other Stroke Risk Factors  Advanced age  Former Cigarette smoker, quit smoking 44 years ago   ETOH use  Hx TIA  6 weeks post cardiac cath  Coronary artery disease - MI, PIC w/ stent  2006  Other Active Problems  BPH  CKD  Hospital day # 1  Workup complete. The Stroke team will sign off at this time. Please call if we can be of further assistance. Follow-up with Dr. Erlinda Hong in 2 months.   Mikey Bussing PA-C [facility] Pager [phone] 06/18/2015, 9:12 AM  ATTENDING NOTE: Patient was seen and examined by me personally. Documentation reflects findings. The laboratory and radiographic studies reviewed by me. ROS completed by me personally and pertinent positives fully documented  Condition: stable  Assessment and plan completed by me personally and fully documented above. Plans/Recommendations include:     No further recommendations at this time  SIGNED BY: Dr. Elissa Hefty     To contact Stroke Continuity provider, please refer to [email or web address] After hours, contact General  Neurology

## 2015-06-19 ENCOUNTER — Inpatient Hospital Stay (HOSPITAL_COMMUNITY): Payer: Medicare Other

## 2015-06-19 LAB — URINALYSIS, ROUTINE W REFLEX MICROSCOPIC
Glucose, UA: NEGATIVE mg/dL
Hgb urine dipstick: NEGATIVE
Ketones, ur: 15 mg/dL — AB
Leukocytes, UA: NEGATIVE
Nitrite: NEGATIVE
Protein, ur: 100 mg/dL — AB
Specific Gravity, Urine: 1.027 (ref 1.005–1.030)
pH: 5.5 (ref 5.0–8.0)

## 2015-06-19 LAB — CBC
HCT: 36.7 % — ABNORMAL LOW (ref 39.0–52.0)
Hemoglobin: 12.1 g/dL — ABNORMAL LOW (ref 13.0–17.0)
MCH: 32.3 pg (ref 26.0–34.0)
MCHC: 33 g/dL (ref 30.0–36.0)
MCV: 97.9 fL (ref 78.0–100.0)
Platelets: 251 10*3/uL (ref 150–400)
RBC: 3.75 MIL/uL — ABNORMAL LOW (ref 4.22–5.81)
RDW: 15.1 % (ref 11.5–15.5)
WBC: 12.6 10*3/uL — ABNORMAL HIGH (ref 4.0–10.5)

## 2015-06-19 LAB — BASIC METABOLIC PANEL
Anion gap: 11 (ref 5–15)
BUN: 13 mg/dL (ref 6–20)
CO2: 18 mmol/L — ABNORMAL LOW (ref 22–32)
Calcium: 8.7 mg/dL — ABNORMAL LOW (ref 8.9–10.3)
Chloride: 104 mmol/L (ref 101–111)
Creatinine, Ser: 1.04 mg/dL (ref 0.61–1.24)
GFR calc Af Amer: >60 mL/min (ref >60)
GFR calc non Af Amer: >60 mL/min (ref >60)
Glucose, Bld: 119 mg/dL — ABNORMAL HIGH (ref 65–99)
Potassium: 3.9 mmol/L (ref 3.5–5.1)
Sodium: 133 mmol/L — ABNORMAL LOW (ref 135–145)

## 2015-06-19 LAB — URINE MICROSCOPIC-ADD ON

## 2015-06-19 LAB — PROTIME-INR
INR: 1.32 (ref 0.00–1.49)
Prothrombin Time: 16.5 seconds — ABNORMAL HIGH (ref 11.6–15.2)

## 2015-06-19 LAB — GLUCOSE, CAPILLARY
Glucose-Capillary: 121 mg/dL — ABNORMAL HIGH (ref 65–99)
Glucose-Capillary: 117 mg/dL — ABNORMAL HIGH (ref 65–99)
Glucose-Capillary: 114 mg/dL — ABNORMAL HIGH (ref 65–99)
Glucose-Capillary: 111 mg/dL — ABNORMAL HIGH (ref 65–99)
Glucose-Capillary: 91 mg/dL (ref 65–99)

## 2015-06-19 LAB — LACTIC ACID, PLASMA
Lactic Acid, Venous: 1.5 mmol/L (ref 0.5–2.0)
Lactic Acid, Venous: 1.9 mmol/L (ref 0.5–2.0)

## 2015-06-19 LAB — APTT: aPTT: 36 seconds (ref 24–37)

## 2015-06-19 LAB — PROCALCITONIN: Procalcitonin: <0.1 ng/mL

## 2015-06-19 MED ORDER — PIPERACILLIN-TAZOBACTAM 3.375 G IVPB
3.3750 g | Freq: Three times a day (TID) | INTRAVENOUS | Status: DC
  Administered 2015-06-19 – 2015-06-21 (×6): 3.375 g via INTRAVENOUS
  Filled 2015-06-19 (×8): qty 50

## 2015-06-19 MED ORDER — VANCOMYCIN HCL IN DEXTROSE 750-5 MG/150ML-% IV SOLN
750.0000 mg | Freq: Two times a day (BID) | INTRAVENOUS | Status: DC
  Administered 2015-06-20: 750 mg via INTRAVENOUS
  Filled 2015-06-19 (×2): qty 150

## 2015-06-19 MED ORDER — PIPERACILLIN-TAZOBACTAM 3.375 G IVPB 30 MIN
3.3750 g | Freq: Once | INTRAVENOUS | Status: AC
  Administered 2015-06-19: 3.375 g via INTRAVENOUS
  Filled 2015-06-19: qty 50

## 2015-06-19 MED ORDER — METOPROLOL TARTRATE 1 MG/ML IV SOLN: 5.0000 mg | Freq: Four times a day (QID) | INTRAVENOUS | Status: DC | PRN

## 2015-06-19 MED ORDER — PIPERACILLIN-TAZOBACTAM 3.375 G IVPB
3.3750 g | Freq: Three times a day (TID) | INTRAVENOUS | Status: DC
  Filled 2015-06-19: qty 50

## 2015-06-19 MED ORDER — SODIUM CHLORIDE 0.9 % IV BOLUS (SEPSIS)
1000.0000 mL | Freq: Once | INTRAVENOUS | Status: AC
  Administered 2015-06-19: 1000 mL via INTRAVENOUS

## 2015-06-19 MED ORDER — VANCOMYCIN HCL 10 G IV SOLR
1750.0000 mg | Freq: Once | INTRAVENOUS | Status: AC
  Administered 2015-06-19: 1750 mg via INTRAVENOUS
  Filled 2015-06-19 (×2): qty 1750

## 2015-06-19 MED ORDER — VANCOMYCIN HCL IN DEXTROSE 750-5 MG/150ML-% IV SOLN
750.0000 mg | Freq: Two times a day (BID) | INTRAVENOUS | Status: DC
  Filled 2015-06-19: qty 150

## 2015-06-19 MED ORDER — VANCOMYCIN HCL IN DEXTROSE 1-5 GM/200ML-% IV SOLN
1000.0000 mg | Freq: Once | INTRAVENOUS | Status: DC
  Filled 2015-06-19: qty 200

## 2015-06-19 NOTE — Progress Notes (Signed)
Patient ID: Herbert Marquez, male   DOB: 1927/11/24, 82 y.o.   MRN: [id number]    PROGRESS NOTE    Herbert Marquez  [id number] DOB: September 07, 1927 DOA: 06/16/2015  PCP: Thressa Sheller, MD   Outpatient Specialists:   Brief Narrative:  82 y.o. male with history of hypertension, hyperlipidemia, diabetes, TIA and myocardial infarction presented for evaluation of syncope and transient right-sided weakness with mental status change. He did not receive IV t-PA due to deficits resolved.   Major events since admission: 4/16 - more tachy and with low grade fevers, seems warmer than the actual temp, sepsis protocol initiated   Assessment & Plan:  SIRS vs SEPSIS - not present on admission but noted to be warm on PE 4/16 with HR up to 120 adn WBC up from 10 --> 12.6 - sepsis protocol initiated and started vancomycin and zosyn until we get more results back - CT chest and UA pending - lactic acid and procalcitonin pending  - IVF provided as SBP in 90's - keep in SDU for now   Dominant left thalamic infarct thought to be secondary to small vessel disease source - improved function in the RUE, pt more clear this AM and calm - MRI L thalamic infarct  - MRA head Occluded L PCA, high grade R P2 stenosis - MRA neck no high grade stenosis - CTA head occluded R V4 and L PCA at origine, R P3  - CTA neck <50% R ICA stenosis, Mod to high grade stenosis L ICA - 2D Echo with normal EF  - EEG with no evidence of seizures  - continue aspirin 81 mg daily - SLP --> dys III diet, PT recommends CIR, order placed   Hyponatremia - mild, pre renal - provide IVF and reassess in AM  Hypertension, essential  - no antihypertensive regimen  - give bolus NS as SBP is in 90's  Hyperlipidemia - Continue statin at discharge  Diabetes type II - A1C 5.6 - continue Actos - continue SSI   Acute kidney injury - secondary to acute event, resolved  - BMP In AM  Obesity - Body mass index is 29.69  DVT  prophylaxis: Lovenox SQ Code Status: Full  Family Communication: Patient and wife at bedside  Disposition Plan: CIR for considered placement   Consultants:   Neurology   PT/OT/SLP  PM&R  Procedures:   None  Antimicrobials:   Vanc 4/16 -->  Zosyn 4/16 -->  Subjective: Calm and alert this AM.   Objective: Filed Vitals:   06/19/15 0730 06/19/15 1054 06/19/15 1157 06/19/15 1200  BP: 134/81 93/60 89/67  89/67  Pulse: 72 134 102 102  Temp: 99.1 F (37.3 C) 98 F (36.7 C) 97.9 F (36.6 C)   TempSrc: Oral Oral Oral   Resp: 23 24 21 16   Height:      Weight:      SpO2: 98% 96% 99% 98%    Intake/Output Summary (Last 24 hours) at 06/19/15 1309 Last data filed at 06/19/15 1211  Gross per 24 hour  Intake    770 ml  Output    450 ml  Net    320 ml   Filed Weights   06/16/15 1702 06/16/15 2304 06/17/15 0500  Weight: 83.915 kg (185 lb) 84.324 kg (185 lb 14.4 oz) 86 kg (189 lb 9.5 oz)    Examination:  General exam: Appears calm and NAD, warm to touch  Respiratory system: Clear to auscultation. Diminished breath sounds at bases with mild rhonchi  Cardiovascular system: S1 & S2 heard, tachycardic. No gallops or clicks. No pedal edema. Gastrointestinal system: Abdomen is nondistended, soft and nontender. N Central nervous system: Alert, calm, still difficulty with RUE control but improving   Data Reviewed: I have personally reviewed following labs and imaging studies  CBC:  Recent Labs Lab 06/16/15 1707 06/16/15 2037 06/18/15 0609 06/19/15 0412  WBC 6.5  --  10.8* 12.6*  NEUTROABS 4.0  --   --   --   HGB 12.5* 13.3 11.8* 12.1*  HCT 38.9* 39.0 36.3* 36.7*  MCV 97.3  --  95.8 97.9  PLT 299  --  263 [id number]   Basic Metabolic Panel:  Recent Labs Lab 06/14/15 0901 06/16/15 1707 06/16/15 2037 06/17/15 0433 06/18/15 0609 06/19/15 0412  NA 135 132* 136 135 136 133*  K 5.2 3.7 3.9 3.5 3.7 3.9  CL 100 101 99* 103 105 104  CO2 23 19*  --  22 20* 18*  GLUCOSE  100* 122* 96 103* 123* 119*  BUN 16 17 16 12 11 13   CREATININE 1.36* 1.46* 1.30* 1.27* 0.95 1.04  CALCIUM 9.4 8.9  --  8.7* 8.9 8.7*   Coagulation Profile:  Recent Labs Lab 06/16/15 1707 06/19/15 1101  INR 1.12 1.32   Cardiac Enzymes:  Recent Labs Lab 06/17/15 0157  TROPONINI <0.03   CBG:  Recent Labs Lab 06/18/15 1241 06/18/15 1715 06/18/15 2145 06/19/15 0750 06/19/15 1155  GLUCAP 114* 110* 121* 117* 114*   Lipid Profile:  Recent Labs  06/17/15 0433  CHOL 106  HDL 34*  LDLCALC 53  TRIG 95  CHOLHDL 3.1   Urine analysis:    Component Value Date/Time   COLORURINE YELLOW 06/16/2015 2036   APPEARANCEUR CLEAR 06/16/2015 2036   LABSPEC 1.015 06/16/2015 2036   PHURINE 6.0 06/16/2015 2036   GLUCOSEU NEGATIVE 06/16/2015 2036   HGBUR NEGATIVE 06/16/2015 2036   BILIRUBINUR NEGATIVE 06/16/2015 2036   KETONESUR NEGATIVE 06/16/2015 2036   PROTEINUR NEGATIVE 06/16/2015 2036   UROBILINOGEN 0.2 08/31/2011 1033   NITRITE NEGATIVE 06/16/2015 2036   LEUKOCYTESUR NEGATIVE 06/16/2015 2036    Recent Results (from the past 240 hour(s))  MRSA PCR Screening     Status: None   Collection Time: 06/17/15  6:00 AM  Result Value Ref Range Status   MRSA by PCR NEGATIVE NEGATIVE Final    Radiology Studies:  Ct Head Wo Contrast 06/16/2015   No acute intracranial pathology seen on CT. 2. Mild to moderate cortical volume loss and scattered small vessel ischemic microangiopathy. 3. Chronic ischemic change at the left external capsule. These results were called by telephone at the time of interpretation on 06/16/2015 at 7:32 pm to Dr. Nicole Kindred, who verbally acknowledged these results.   Ct Angio Neck W/cm &/or Wo/cm 06/17/2015  CTA NECK: Moderately motion degraded examination. Probable moderate to high-grade stenosis LEFT internal carotid artery origin, limited by motion. Less than 50% stenosis RIGHT internal carotid artery origin. CTA HEAD: Moderately motion degraded examination.  Occluded RIGHT V4 segment. Occluded LEFT posterior cerebral artery at origin. Occluded proximal RIGHT P3 segment.   Herbert Marquez Wo Contrast 06/16/2015 Mildly motion degraded examination. Acute 8 x 12 mm LEFT thalamic infarct. Involutional changes. Mild to moderate chronic small vessel ischemic disease. MRA HEAD: Occluded LEFT posterior cerebral artery, given MRI findings, this is likely acute. High-grade stenosis RIGHT P2 segment, with presumed slow flow distally. MRA NECK: Mildly motion degraded examination. No high-grade stenosis. Motion and presumed atherosclerosis resulting in luminal irregularity.  Herbert Brain Ltd W/o Cm 06/17/2015  Increased conspicuity of the left splenic lesion without significant change and size. 2. No new left-sided infarcts. 3. A 9 mm medial right thalamic infarct is better visualized on today's study. 4. New 7 mm medial right temporal lobe infarct suggesting ongoing disease.   Scheduled Meds: . aspirin EC  325 mg Oral Daily  . atorvastatin  80 mg Oral Daily  . clopidogrel  75 mg Oral Daily  . enoxaparin (LOVENOX) injection  40 mg Subcutaneous Q24H  . insulin aspart  0-9 Units Subcutaneous TID WC  . LORazepam  1 mg Intravenous Once  . LORazepam  0.5 mg Oral Once  . pantoprazole  40 mg Oral Daily  . pioglitazone  15 mg Oral Daily  . piperacillin-tazobactam (ZOSYN)  IV  3.375 g Intravenous Q8H  . sodium chloride  1,000 mL Intravenous Once  . sodium chloride flush  3 mL Intravenous Q12H  . vancomycin  1,750 mg Intravenous Once  . vancomycin  750 mg Intravenous Q12H   Continuous Infusions:    LOS: 2 days   Time spent: 20 minutes   Faye Ramsay, MD [facility] Pager [phone]  If 7PM-7AM, please contact night-coverage [email or web address] Password [email or web address] 06/19/2015, 1:09 PM

## 2015-06-19 NOTE — Progress Notes (Signed)
Pt oral temp was 99.1, tylenol given and temp decreased to 98.7. Pt's heart rate increasing to 130's. Dr Doyle Askew aware. EKG done at bedside and rapid response called and updated , spoke with Herbert Marquez. Consuelo Pandy RN

## 2015-06-19 NOTE — Progress Notes (Signed)
Inpatient Rehabilitation  Patient was screened by Elvis Laufer for appropriateness for an Inpatient Acute Rehab consult.  At this time, we are recommending Inpatient Rehab consult.  Please order consult if you are agreeable.  Giulia Hickey PT Inpatient Rehab Admissions Coordinator Cell [phone] Office [phone]    

## 2015-06-19 NOTE — Progress Notes (Signed)
OT Cancellation Note  Patient Details Name: KENTAE SONNER MRN: [id number] DOB: 1927-09-21   Cancelled Treatment:    Reason Eval/Treat Not Completed:  (Rapid response called earlier. Nurse recommended holding OT for now)  Benito Mccreedy OTR/L [id number] 06/19/2015, 1:16 PM

## 2015-06-19 NOTE — Progress Notes (Signed)
Pharmacy Antibiotic Note  Herbert Marquez is a 81 y.o. male admitted on 06/16/2015 with syncope.  Pharmacy to dose vanc and zosyn for sepsis. Currently afebrile, WBC 10.8>12.6. CT chest, lactate, and PCT ordered. SCr currently 1.04 and appears stable.  Plan: Zosyn 3.375g q8h Vanc 1750mg  x1 followed by 750mg  q12h F/U cultures, LOT, ABX de-escalation Monitor renal function, clinical progress   Height: 5\' 7"  (170.2 cm) Weight: 189 lb 9.5 oz (86 kg) IBW/kg (Calculated) : 66.1  Temp (24hrs), Avg:98.1 F (36.7 C), Min:97.4 F (36.3 C), Max:99.1 F (37.3 C)   Recent Labs Lab 06/16/15 1707 06/16/15 2037 06/17/15 0433 06/18/15 0609 06/19/15 0412  WBC 6.5  --   --  10.8* 12.6*  CREATININE 1.46* 1.30* 1.27* 0.95 1.04    Estimated Creatinine Clearance: 54.4 mL/min (by C-G formula based on Cr of 1.04).    No Known Allergies  Antimicrobials this admission: Vanc 4/16 >> Zosyn 4/16 >>  Microbiology results: 4/16 BCx: pending 4/16 UCx: pending 4/14 MRSA screen: neg  Thank you for allowing pharmacy to be a part of this patient's care.  Stephens November, PharmD Clinical Pharmacy Resident 11:13 AM, 06/19/2015

## 2015-06-20 ENCOUNTER — Encounter (HOSPITAL_COMMUNITY): Payer: Self-pay | Admitting: Student

## 2015-06-20 ENCOUNTER — Inpatient Hospital Stay (HOSPITAL_COMMUNITY): Payer: Medicare Other

## 2015-06-20 DIAGNOSIS — R0682 Tachypnea, not elsewhere classified: Secondary | ICD-10-CM

## 2015-06-20 DIAGNOSIS — I471 Supraventricular tachycardia, unspecified: Secondary | ICD-10-CM | POA: Insufficient documentation

## 2015-06-20 DIAGNOSIS — I10 Essential (primary) hypertension: Secondary | ICD-10-CM | POA: Insufficient documentation

## 2015-06-20 DIAGNOSIS — N179 Acute kidney failure, unspecified: Secondary | ICD-10-CM

## 2015-06-20 DIAGNOSIS — I69991 Dysphagia following unspecified cerebrovascular disease: Secondary | ICD-10-CM | POA: Insufficient documentation

## 2015-06-20 DIAGNOSIS — I2510 Atherosclerotic heart disease of native coronary artery without angina pectoris: Secondary | ICD-10-CM | POA: Insufficient documentation

## 2015-06-20 DIAGNOSIS — E118 Type 2 diabetes mellitus with unspecified complications: Secondary | ICD-10-CM | POA: Insufficient documentation

## 2015-06-20 DIAGNOSIS — E785 Hyperlipidemia, unspecified: Secondary | ICD-10-CM

## 2015-06-20 DIAGNOSIS — R509 Fever, unspecified: Secondary | ICD-10-CM | POA: Insufficient documentation

## 2015-06-20 DIAGNOSIS — D72829 Elevated white blood cell count, unspecified: Secondary | ICD-10-CM | POA: Insufficient documentation

## 2015-06-20 DIAGNOSIS — E876 Hypokalemia: Secondary | ICD-10-CM | POA: Insufficient documentation

## 2015-06-20 DIAGNOSIS — E871 Hypo-osmolality and hyponatremia: Secondary | ICD-10-CM | POA: Insufficient documentation

## 2015-06-20 DIAGNOSIS — R000 Tachycardia, unspecified: Secondary | ICD-10-CM

## 2015-06-20 DIAGNOSIS — I639 Cerebral infarction, unspecified: Secondary | ICD-10-CM

## 2015-06-20 LAB — TROPONIN I
Troponin I: 0.03 ng/mL (ref <0.031)
Troponin I: 0.03 ng/mL (ref <0.031)
Troponin I: <0.03 ng/mL (ref <0.031)

## 2015-06-20 LAB — CBC
HCT: 37.8 % — ABNORMAL LOW (ref 39.0–52.0)
Hemoglobin: 12.2 g/dL — ABNORMAL LOW (ref 13.0–17.0)
MCH: 31 pg (ref 26.0–34.0)
MCHC: 32.3 g/dL (ref 30.0–36.0)
MCV: 95.9 fL (ref 78.0–100.0)
Platelets: 288 10*3/uL (ref 150–400)
RBC: 3.94 MIL/uL — ABNORMAL LOW (ref 4.22–5.81)
RDW: 15.1 % (ref 11.5–15.5)
WBC: 14.2 10*3/uL — ABNORMAL HIGH (ref 4.0–10.5)

## 2015-06-20 LAB — GLUCOSE, CAPILLARY
Glucose-Capillary: 115 mg/dL — ABNORMAL HIGH (ref 65–99)
Glucose-Capillary: 128 mg/dL — ABNORMAL HIGH (ref 65–99)
Glucose-Capillary: 119 mg/dL — ABNORMAL HIGH (ref 65–99)
Glucose-Capillary: 124 mg/dL — ABNORMAL HIGH (ref 65–99)

## 2015-06-20 LAB — BASIC METABOLIC PANEL
Anion gap: 12 (ref 5–15)
BUN: 12 mg/dL (ref 6–20)
CO2: 21 mmol/L — ABNORMAL LOW (ref 22–32)
Calcium: 8.6 mg/dL — ABNORMAL LOW (ref 8.9–10.3)
Chloride: 99 mmol/L — ABNORMAL LOW (ref 101–111)
Creatinine, Ser: 1.31 mg/dL — ABNORMAL HIGH (ref 0.61–1.24)
GFR calc Af Amer: 56 mL/min — ABNORMAL LOW (ref >60)
GFR calc non Af Amer: 48 mL/min — ABNORMAL LOW (ref >60)
Glucose, Bld: 125 mg/dL — ABNORMAL HIGH (ref 65–99)
Potassium: 3.3 mmol/L — ABNORMAL LOW (ref 3.5–5.1)
Sodium: 132 mmol/L — ABNORMAL LOW (ref 135–145)

## 2015-06-20 MED ORDER — POTASSIUM CHLORIDE CRYS ER 20 MEQ PO TBCR
40.0000 meq | EXTENDED_RELEASE_TABLET | Freq: Once | ORAL | Status: AC
  Administered 2015-06-20: 40 meq via ORAL
  Filled 2015-06-20: qty 2

## 2015-06-20 MED ORDER — METOPROLOL TARTRATE 12.5 MG HALF TABLET
12.5000 mg | ORAL_TABLET | Freq: Two times a day (BID) | ORAL | Status: DC
  Administered 2015-06-20 – 2015-06-21 (×2): 12.5 mg via ORAL
  Filled 2015-06-20 (×2): qty 1

## 2015-06-20 NOTE — Progress Notes (Signed)
SLP Cancellation Note  Patient Details Name: Herbert Marquez MRN: [id number] DOB: [birth year]   Cancelled treatment:       Reason Eval/Treat Not Completed: Patient at procedure or test/unavailable. Will return as able.   Germain Osgood, M.A. CCC-SLP [phone]  Germain Osgood 06/20/2015, 1:57 PM

## 2015-06-20 NOTE — Progress Notes (Addendum)
Physical Therapy Treatment Patient Details Name: Herbert Marquez MRN: [id number] DOB: 07-Feb-1928 Today's Date: 06/20/2015    History of Present Illness Patient is an 82 yo male admitted 06/16/15 after syncopal episode with transient Rt weakness and AMS.  Patient then developed more significant symptoms of Rt-sided weakness, confusion.  MRI showed Lt thalamic infarct, and repeat MRI showed bilateral PCA infarcts.    PMH:  HTN, DM, TIA, MI    PT Comments    Pt with increased ability holding midline today. 2 attempts required with sit to stand before proceeding with pivot transfer. Cueing required while sitting in recliner to attend to RUE as it tends to slip of armrest and dangle. Pt educated on risk for increased edema with hand in dependent position. Vitals stable throughout Rx.  Follow Up Recommendations  CIR;Supervision/Assistance - 24 hour     Equipment Recommendations  Wheelchair (measurements PT);Wheelchair cushion (measurements PT)    Recommendations for Other Services Rehab consult     Precautions / Restrictions Precautions Precautions: Fall Restrictions Weight Bearing Restrictions: No    Mobility  Bed Mobility     Rolling: Min assist Sidelying to sit: Mod assist       General bed mobility comments: verbal cues for sequencing, use of bed pad to scoot hips to EOB  Transfers   Equipment used: 2 person hand held assist Transfers: Stand Pivot Transfers Sit to Stand: Max assist;+2 physical assistance Stand pivot transfers: Max assist;+2 physical assistance       General transfer comment: verbal cues for sequencing, pivot toward the left; Assist to stabilize stance prior to pivot  Ambulation/Gait             General Gait Details: Unable   Stairs            Wheelchair Mobility    Modified Rankin (Stroke Patients Only) Modified Rankin (Stroke Patients Only) Pre-Morbid Rankin Score: No significant disability Modified Rankin: Severe  disability     Balance   Sitting-balance support: Single extremity supported;Feet supported Sitting balance-Leahy Scale: Fair     Standing balance support: Bilateral upper extremity supported;During functional activity Standing balance-Leahy Scale: Zero                      Cognition Arousal/Alertness: Awake/alert Behavior During Therapy: WFL for tasks assessed/performed Overall Cognitive Status: Within Functional Limits for tasks assessed                      Exercises      General Comments        Pertinent Vitals/Pain Pain Assessment: No/denies pain    Home Living                      Prior Function            PT Goals (current goals can now be found in the care plan section) Acute Rehab PT Goals Patient Stated Goal: To walk PT Goal Formulation: With patient/family Time For Goal Achievement: 07/02/15 Potential to Achieve Goals: Good Progress towards PT goals: Progressing toward goals    Frequency  Min 4X/week    PT Plan Current plan remains appropriate    Co-evaluation             End of Session Equipment Utilized During Treatment: Gait belt Activity Tolerance: Patient limited by fatigue Patient left: in chair;with call bell/phone within reach;with chair alarm set;with family/visitor present     Time: [id number] PT  Time Calculation (min) (ACUTE ONLY): 15 min  Charges:  $Therapeutic Activity: 8-22 mins                    G Codes:      Lorriane Shire 06/20/2015, 10:14 AM

## 2015-06-20 NOTE — Progress Notes (Signed)
Patient ID: Herbert Marquez, male   DOB: [birth year], 82 y.o.   MRN: [id number]    PROGRESS NOTE    Herbert Marquez  [id number] DOB: [birth year] DOA: 06/16/2015  PCP: Thressa Sheller, MD   Outpatient Specialists:   Brief Narrative:  82 y.o. male with history of hypertension, hyperlipidemia, diabetes, TIA and myocardial infarction presented for evaluation of syncope and transient right-sided weakness with mental status change. He did not receive IV t-PA due to deficits resolved.   Major events since admission: 4/16 - more tachy and with low grade fevers, seems warmer than the actual temp, sepsis protocol initiated  4/17 - overall better but still with intermittent tachy and varying BP, as low as SBP 80's and up to 130's  Assessment & Plan:  SIRS vs SEPSIS - not present on admission but noted to be warm on PE 4/16 with HR up to 120 - no clear infectious source noted, UA clear and CT chest with no indication of PNA - unclear why pt still with low grade fever up to 100.3 F and WBC up from 10 --> 12.6 --> 14 K - sepsis protocol initiated 4/16, started vancomycin, stop vanc but will keep on Zosyn until we get more data back  - lactic acid WNL and procalcitonin < 0.1 - IVF provided as SBP in 90's - keep in SDU for now   Tachycardic with HR up to 140's - tried Metoprolol, bradycardia also reported but I did not see it under vital signs review  - EKG requested - difficult to give Metoprolol due to soft BP - will d/w cardiology   Dominant left thalamic infarct thought to be secondary to small vessel disease source - improved function in the RUE, pt more clear this AM and calm - MRI L thalamic infarct  - MRA head Occluded L PCA, high grade R P2 stenosis - MRA neck no high grade stenosis - CTA head occluded R V4 and L PCA at origine, R P3  - CTA neck <50% R ICA stenosis, Mod to high grade stenosis L ICA - 2D Echo with normal EF  - EEG with no evidence of seizures  - continue  aspirin 81 mg daily - SLP --> dys III diet, PT recommends CIR, order placed  - will be OK to d/c to IR when pt more stable   Hyponatremia - mild, pre renal - BMP in AM  Hypertension, essential  - no antihypertensive regimen  - give bolus NS as SBP is in 90's  Hypokalemia - supplement and repeat BMP in AM  Hyperlipidemia - Continue statin at discharge  Diabetes type II - A1C 5.6 - continue Actos - continue SSI   Acute kidney injury - secondary to acute event, resolved initially but now up again - gave fluids, renal US pending  - BMP In AM  Obesity - Body mass index is 29.69  DVT prophylaxis: Lovenox SQ Code Status: Full  Family Communication: Patient and wife at bedside  Disposition Plan: CIR when BP and HR stable   Consultants:   Neurology   PT/OT/SLP  PM&R  Cardiology   Procedures:   None  Antimicrobials:   Vanc 4/16 --> 4/17  Zosyn 4/16 -->  Subjective: Calm and alert this AM.   Objective: Filed Vitals:   06/20/15 0734 06/20/15 1003 06/20/15 1006 06/20/15 1013  BP:  84/58 91/74 102/76  Pulse:  91 143   Temp: 99 F (37.2 C)     TempSrc: Other (Comment)  Resp:  15 24   Height:      Weight:      SpO2:  97% 98%     Intake/Output Summary (Last 24 hours) at 06/20/15 1116 Last data filed at 06/20/15 0202  Gross per 24 hour  Intake   1790 ml  Output    450 ml  Net   1340 ml   Filed Weights   06/16/15 2304 06/17/15 0500 06/20/15 0500  Weight: 84.324 kg (185 lb 14.4 oz) 86 kg (189 lb 9.5 oz) 89 kg (196 lb 3.4 oz)    Examination:  General exam: Appears calm and NAD, warm to touch  Respiratory system: Clear to auscultation. Diminished breath sounds at bases with mild rhonchi  Cardiovascular system: S1 & S2 heard, tachycardic. No gallops or clicks. No pedal edema. Gastrointestinal system: Abdomen is nondistended, soft and nontender. N Central nervous system: Alert, calm, still difficulty with RUE control but improving   Data  Reviewed: I have personally reviewed following labs and imaging studies  CBC:  Recent Labs Lab 06/16/15 1707 06/16/15 2037 06/18/15 0609 06/19/15 0412 06/20/15 0353  WBC 6.5  --  10.8* 12.6* 14.2*  NEUTROABS 4.0  --   --   --   --   HGB 12.5* 13.3 11.8* 12.1* 12.2*  HCT 38.9* 39.0 36.3* 36.7* 37.8*  MCV 97.3  --  95.8 97.9 95.9  PLT 299  --  263 251 [id number]   Basic Metabolic Panel:  Recent Labs Lab 06/16/15 1707 06/16/15 2037 06/17/15 0433 06/18/15 0609 06/19/15 0412 06/20/15 0353  NA 132* 136 135 136 133* 132*  K 3.7 3.9 3.5 3.7 3.9 3.3*  CL 101 99* 103 105 104 99*  CO2 19*  --  22 20* 18* 21*  GLUCOSE 122* 96 103* 123* 119* 125*  BUN 17 16 12 11 13 12   CREATININE 1.46* 1.30* 1.27* 0.95 1.04 1.31*  CALCIUM 8.9  --  8.7* 8.9 8.7* 8.6*   Coagulation Profile:  Recent Labs Lab 06/16/15 1707 06/19/15 1101  INR 1.12 1.32   Cardiac Enzymes:  Recent Labs Lab 06/17/15 0157  TROPONINI <0.03   CBG:  Recent Labs Lab 06/19/15 0750 06/19/15 1155 06/19/15 1639 06/19/15 2117 06/20/15 0722  GLUCAP 117* 114* 111* 91 115*   Lipid Profile: No results for input(s): CHOL, HDL, LDLCALC, TRIG, CHOLHDL, LDLDIRECT in the last 72 hours. Urine analysis:    Component Value Date/Time   COLORURINE AMBER* 06/19/2015 1346   APPEARANCEUR CLEAR 06/19/2015 1346   LABSPEC 1.027 06/19/2015 1346   PHURINE 5.5 06/19/2015 1346   GLUCOSEU NEGATIVE 06/19/2015 1346   HGBUR NEGATIVE 06/19/2015 1346   BILIRUBINUR SMALL* 06/19/2015 1346   KETONESUR 15* 06/19/2015 1346   PROTEINUR 100* 06/19/2015 1346   UROBILINOGEN 0.2 08/31/2011 1033   NITRITE NEGATIVE 06/19/2015 1346   LEUKOCYTESUR NEGATIVE 06/19/2015 1346    Recent Results (from the past 240 hour(s))  MRSA PCR Screening     Status: None   Collection Time: 06/17/15  6:00 AM  Result Value Ref Range Status   MRSA by PCR NEGATIVE NEGATIVE Final    Radiology Studies:  Ct Head Wo Contrast 06/16/2015   No acute intracranial  pathology seen on CT. 2. Mild to moderate cortical volume loss and scattered small vessel ischemic microangiopathy. 3. Chronic ischemic change at the left external capsule. These results were called by telephone at the time of interpretation on 06/16/2015 at 7:32 pm to Dr. Nicole Kindred, who verbally acknowledged these results.   Ct Angio  Neck W/cm &/or Wo/cm 06/17/2015  CTA NECK: Moderately motion degraded examination. Probable moderate to high-grade stenosis LEFT internal carotid artery origin, limited by motion. Less than 50% stenosis RIGHT internal carotid artery origin. CTA HEAD: Moderately motion degraded examination. Occluded RIGHT V4 segment. Occluded LEFT posterior cerebral artery at origin. Occluded proximal RIGHT P3 segment.   Mr Jeri Cos Wo Contrast 06/16/2015 Mildly motion degraded examination. Acute 8 x 12 mm LEFT thalamic infarct. Involutional changes. Mild to moderate chronic small vessel ischemic disease. MRA HEAD: Occluded LEFT posterior cerebral artery, given MRI findings, this is likely acute. High-grade stenosis RIGHT P2 segment, with presumed slow flow distally. MRA NECK: Mildly motion degraded examination. No high-grade stenosis. Motion and presumed atherosclerosis resulting in luminal irregularity.   Mr Brain Ltd W/o Cm 06/17/2015  Increased conspicuity of the left splenic lesion without significant change and size. 2. No new left-sided infarcts. 3. A 9 mm medial right thalamic infarct is better visualized on today's study. 4. New 7 mm medial right temporal lobe infarct suggesting ongoing disease.   Scheduled Meds: . aspirin EC  325 mg Oral Daily  . atorvastatin  80 mg Oral Daily  . clopidogrel  75 mg Oral Daily  . enoxaparin (LOVENOX) injection  40 mg Subcutaneous Q24H  . insulin aspart  0-9 Units Subcutaneous TID WC  . LORazepam  1 mg Intravenous Once  . LORazepam  0.5 mg Oral Once  . pantoprazole  40 mg Oral Daily  . pioglitazone  15 mg Oral Daily  . piperacillin-tazobactam  (ZOSYN)  IV  3.375 g Intravenous Q8H  . sodium chloride flush  3 mL Intravenous Q12H  . vancomycin  750 mg Intravenous Q12H   Continuous Infusions:    LOS: 3 days   Time spent: 20 minutes   Faye Ramsay, MD [facility] Pager [phone]  If 7PM-7AM, please contact night-coverage [email or web address] Password [email or web address] 06/20/2015, 11:16 AM

## 2015-06-20 NOTE — Consult Note (Signed)
Physical Medicine and Rehabilitation Consult Reason for Consult: Left Thalamic infarct Referring Physician: [facility]   HPI: Herbert Marquez is a 82 y.o. right handed male with history of hypertension, type 2 diabetes mellitus, coronary artery disease with stenting. Patient lives with spouse, who is only able to provide supervision at discharge. Independent with a cane prior to admission due to knee and hip pain. One level home with 2 steps to entry. Presented 06/16/2015 with syncope, dizziness. No seizure activity noted. Initially in the ED workup unremarkable. Patient subsequently developed right sided weakness and facial droop in the ED with a blank stare and was unresponsive for at least 1 minute. Cranial CT scan negative. Patient did not receive TPA. Troponin negative. MRI of the brain showed acute 8 x 12 mm left thalamic infarct. Chronic small vessel disease. 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 High-grade stenosis right P2 segment. MRA of neck with no high-grade stenosis. CTA of neck probable moderate to high-grade stenosis left internal carotid artery origin, limited by motion. Less than 50% stenosis right internal carotid artery. CTA of head occluded right V4 segment. Occluded left posterior cerebral artery at origin. Occluded proximal right P3 segment. EEG consistent with mild generalized nonspecific cerebral dysfunction/encephalopathy. No seizure activity. Echocardiogram ejection fraction 60% no wall motion abnormalities. Grade 1 diastolic dysfunction. Neurology consulted maintain on aspirin and Plavix for CVA prophylaxis. Subcutaneous Lovenox for DVT prophylaxis. Dysphagia #2 thin liquid diet. On 06/19/2015 patient with low-grade fever and some nonspecific tachycardia. Sepsis protocol initiated. CT of the chest without acute abnormality. White blood cell count 14,200 maintain on broad-spectrum antibiotics. Mild hypokalemia 3.3. Creatinine 1.31. Urine culture negative  nitrite. Physical therapy evaluation completed with recommendations of physical medicine rehabilitation consult.   Review of Systems  Constitutional: Negative for chills.  HENT: Negative for hearing loss.   Eyes: Negative for blurred vision and double vision.  Respiratory: Negative for cough and shortness of breath.   Cardiovascular: Positive for palpitations. Negative for chest pain and leg swelling.  Gastrointestinal: Positive for constipation. Negative for nausea and vomiting.       GERD  Genitourinary: Positive for urgency. Negative for dysuria and hematuria.  Musculoskeletal: Positive for myalgias and joint pain.  Skin: Negative for rash.  Neurological: Positive for dizziness and weakness. Negative for tremors and headaches.  All other systems reviewed and are negative.  Past Medical History  Diagnosis Date  . Coronary artery disease   . Hyperlipidemia   . Hypertension   . BPH (benign prostatic hypertrophy)   . TIA (transient ischemic attack)     Approximately 6 weeks post-cardiac catheterization.   . MI (myocardial infarction) ([place]) 10/2004  . Type II diabetes mellitus ([place])     "prediabetic; lost alot of weight; not diabetic now" (06/16/2015)  . GERD (gastroesophageal reflux disease)   . Arthritis     "pretty much all over"   . Basal cell carcinoma     "several burned off his body, face, head"   Past Surgical History  Procedure Laterality Date  . Coronary angioplasty with stent placement  10/2004    stenting x 2 to RCA  . Cataract extraction, bilateral    . Cardiovascular stress test  07/01/2007    EF 74%  . Hip arthroplasty  03/09/2011    Procedure: ARTHROPLASTY BIPOLAR HIP;  Surgeon: Mauri Pole;  Location: [facility];  Service: Orthopedics;  Laterality: Left;  . Hernia repair    . Laparoscopic incisional /  umbilical / ventral hernia repair      "below his naval"   Family History  Problem Relation Age of Onset  . Lung cancer Father   . Lung cancer Brother   .  Hypertension Mother    Social History:  reports that he quit smoking about 44 years ago. He has never used smokeless tobacco. He reports that he drinks about 7.2 oz of alcohol per week. He reports that he does not use illicit drugs. Allergies: No Known Allergies Medications Prior to Admission  Medication Sig Dispense Refill  . aspirin 81 MG tablet Take 81 mg by mouth daily.     [name] atorvastatin (LIPITOR) 40 MG tablet TAKE ONE TABLET BY MOUTH ONCE DAILY 90 tablet 2  . diphenhydrAMINE (BENADRYL) 25 MG tablet Take 25 mg by mouth every 6 (six) hours as needed. Rhinorrhea    . metoprolol succinate (TOPROL-XL) 100 MG 24 hr tablet TAKE ONE TABLET BY MOUTH ONCE DAILY WITH MEALS 90 tablet 3  . Multiple Vitamin (MULTIVITAMIN) tablet Take 1 tablet by mouth daily.     . nitroGLYCERIN (NITROSTAT) 0.4 MG SL tablet DISSOLVE ONE TABLET UNDER THE TONGUE EVERY 5 MINUTES AS NEEDED FOR CHEST PAIN.  DO NOT EXCEED A TOTAL OF 3 DOSES IN 15 MINUTES 25 tablet 3  . omeprazole (PRILOSEC) 20 MG capsule Take 20 mg by mouth daily.     . pioglitazone (ACTOS) 30 MG tablet Take 15 mg by mouth daily.     . Tamsulosin HCl (FLOMAX) 0.4 MG CAPS Take by mouth daily after supper.    . triamterene-hydrochlorothiazide (MAXZIDE-25) 37.5-25 MG tablet Take 1 tablet by mouth daily.      Home: Home Living Family/patient expects to be discharged to:: Private residence Living Arrangements: Spouse/significant other Available Help at Discharge: Family, Available 24 hours/day Type of Home: House Home Access: Stairs to enter [employer] of Steps: 2 Entrance Stairs-Rails: Right Home Layout: One level Home Equipment: Environmental consultant - 2 wheels, Cane - single point, Bedside commode, Shower seat  Functional History: Prior Function Level of Independence: Independent with assistive device(s) Comments: Uses cane for gait. Functional Status:  Mobility: Bed Mobility Overal bed mobility: Needs Assistance Bed Mobility: Rolling, Sidelying to  Sit, Sit to Sidelying Rolling: Min assist Sidelying to sit: Mod assist, +2 for physical assistance Sit to sidelying: Mod assist, +2 for physical assistance General bed mobility comments: Verbal cues for technique - step-by-step.  Patient with decreased control of RUE when reaching for rail to roll.  Assist to bring hips onto side.  Assist to bring trunk to sitting position.  Assist to scoot hips to EOB with use of bed pad.  Patient unable to functionally use RUE to assist - decreased control.  Patient able to maintain static sitting balance with min guard assist.  Required min to mod assist to maintain balance with dynamic activity - falls posteriorly.  Returned to sidelying with +2 assist to control trunk and bring LE's onto bed. Transfers Overall transfer level: Needs assistance Equipment used: 2 person hand held assist Transfers: Sit to/from Stand Sit to Stand: Max assist, +2 physical assistance General transfer comment: Verbal cues to stand.  Required +2 max assist to initiate sit to stand.  Support provided to RUE and Rt knee.  Patient unable to reach fully upright position.  Difficulty keeping body in midline.  Difficulty supporting weight on RLE. Ambulation/Gait General Gait Details: Unable    ADL:    Cognition: Cognition Overall Cognitive Status: Within Functional Limits for  tasks assessed Orientation Level: Oriented to person, Oriented to place, Oriented to situation, Disoriented to time Cognition Arousal/Alertness: Awake/alert Behavior During Therapy: [facility] for tasks assessed/performed Overall Cognitive Status: Within Functional Limits for tasks assessed  Blood pressure 141/73, pulse 80, temperature 97.8 F (36.6 C), temperature source Oral, resp. rate 23, height 5\' 7"  (1.702 m), weight 89 kg (196 lb 3.4 oz), SpO2 97 %. Physical Exam  Vitals reviewed. Constitutional: He is oriented to person, place, and time. He appears well-developed and well-nourished.  HENT:  Head:  Normocephalic and atraumatic.  Eyes: Conjunctivae and EOM are normal.  Neck: Normal range of motion. Neck supple. No thyromegaly present.  Cardiovascular: Regular rhythm.   Tachycardic  Respiratory: Effort normal and breath sounds normal. No respiratory distress.  GI: Soft. Bowel sounds are normal. He exhibits no distension.  Musculoskeletal: He exhibits no edema or tenderness.  Neurological: He is alert and oriented to person, place, and time.  Makes good eye contact with examiner.  Follows simple commands. Left sided tongue deviation Sensation intact to light touch Motor: LUE: 4+/5 proximal to distal RUE: 4-/5 proximal to distal LLE: hip flexion 3+/5, knee extension 4/5, ankle dorsi/plantar flexion 4/5 RLE: hip flexion 2+/5, knee extension 3-/5, ankle dorsi/plantarflexion 4-/5  Skin: Skin is warm and dry.  Psychiatric: He has a normal mood and affect. His behavior is normal.    Results for orders placed or performed during the hospital encounter of 06/16/15 (from the past 24 hour(s))  Glucose, capillary     Status: Abnormal   Collection Time: 06/19/15  7:50 AM  Result Value Ref Range   Glucose-Capillary 117 (H) 65 - 99 mg/dL  Lactic acid, plasma     Status: None   Collection Time: 06/19/15 11:01 AM  Result Value Ref Range   Lactic Acid, Venous 1.5 0.5 - 2.0 mmol/L  Procalcitonin     Status: None   Collection Time: 06/19/15 11:01 AM  Result Value Ref Range   Procalcitonin <0.10 ng/mL  Protime-INR     Status: Abnormal   Collection Time: 06/19/15 11:01 AM  Result Value Ref Range   Prothrombin Time 16.5 (H) 11.6 - 15.2 seconds   INR 1.32 0.00 - 1.49  APTT     Status: None   Collection Time: 06/19/15 11:01 AM  Result Value Ref Range   aPTT 36 24 - 37 seconds  Glucose, capillary     Status: Abnormal   Collection Time: 06/19/15 11:55 AM  Result Value Ref Range   Glucose-Capillary 114 (H) 65 - 99 mg/dL  Urinalysis, Routine w reflex microscopic (not at [facility])     Status:  Abnormal   Collection Time: 06/19/15  1:46 PM  Result Value Ref Range   Color, Urine AMBER (A) YELLOW   APPearance CLEAR CLEAR   Specific Gravity, Urine 1.027 1.005 - 1.030   pH 5.5 5.0 - 8.0   Glucose, UA NEGATIVE NEGATIVE mg/dL   Hgb urine dipstick NEGATIVE NEGATIVE   Bilirubin Urine SMALL (A) NEGATIVE   Ketones, ur 15 (A) NEGATIVE mg/dL   Protein, ur 100 (A) NEGATIVE mg/dL   Nitrite NEGATIVE NEGATIVE   Leukocytes, UA NEGATIVE NEGATIVE  Urine microscopic-add on     Status: Abnormal   Collection Time: 06/19/15  1:46 PM  Result Value Ref Range   Squamous Epithelial / LPF 0-5 (A) NONE SEEN   WBC, UA 0-5 0 - 5 WBC/hpf   RBC / HPF 0-5 0 - 5 RBC/hpf   Bacteria, UA RARE (A) NONE  SEEN   Casts HYALINE CASTS (A) NEGATIVE   Urine-Other MUCOUS PRESENT   Lactic acid, plasma     Status: None   Collection Time: 06/19/15  2:26 PM  Result Value Ref Range   Lactic Acid, Venous 1.9 0.5 - 2.0 mmol/L  Glucose, capillary     Status: Abnormal   Collection Time: 06/19/15  4:39 PM  Result Value Ref Range   Glucose-Capillary 111 (H) 65 - 99 mg/dL  Glucose, capillary     Status: None   Collection Time: 06/19/15  9:17 PM  Result Value Ref Range   Glucose-Capillary 91 65 - 99 mg/dL  CBC     Status: Abnormal   Collection Time: 06/20/15  3:53 AM  Result Value Ref Range   WBC 14.2 (H) 4.0 - 10.5 K/uL   RBC 3.94 (L) 4.22 - 5.81 MIL/uL   Hemoglobin 12.2 (L) 13.0 - 17.0 g/dL   HCT 37.8 (L) 39.0 - 52.0 %   MCV 95.9 78.0 - 100.0 fL   MCH 31.0 26.0 - 34.0 pg   MCHC 32.3 30.0 - 36.0 g/dL   RDW 15.1 11.5 - 15.5 %   Platelets 288 150 - 400 K/uL  Basic metabolic panel     Status: Abnormal   Collection Time: 06/20/15  3:53 AM  Result Value Ref Range   Sodium 132 (L) 135 - 145 mmol/L   Potassium 3.3 (L) 3.5 - 5.1 mmol/L   Chloride 99 (L) 101 - 111 mmol/L   CO2 21 (L) 22 - 32 mmol/L   Glucose, Bld 125 (H) 65 - 99 mg/dL   BUN 12 6 - 20 mg/dL   Creatinine, Ser 1.31 (H) 0.61 - 1.24 mg/dL   Calcium 8.6 (L)  8.9 - 10.3 mg/dL   GFR calc non Af Amer 48 (L) >60 mL/min   GFR calc Af Amer 56 (L) >60 mL/min   Anion gap 12 5 - 15   Ct Chest Wo Contrast  06/19/2015  CLINICAL DATA:  Tachycardia and low-grade fever EXAM: CT CHEST WITHOUT CONTRAST TECHNIQUE: Multidetector CT imaging of the chest was performed following the standard protocol without IV contrast. COMPARISON:  03/09/2011 FINDINGS: Lungs are well aerated bilaterally. Some mild fibrotic changes are seen. No focal confluent infiltrate or sizable effusion is seen. No parenchymal nodules are noted. The thoracic inlet is within normal limits. The thoracic aorta and its branches demonstrate calcific changes without aneurysmal dilatation. The coronary arteries demonstrate heavy calcifications. No hilar or mediastinal adenopathy is noted. The visualized upper abdomen reveals no acute abnormality. The osseous structures show degenerative change of the thoracic spine. IMPRESSION: Chronic fibrotic changes without acute abnormality. Electronically Signed   By: Inez Catalina M.D.   On: 06/19/2015 17:43    Assessment/Plan: Diagnosis: Left Thalamic infarct Labs and images independently reviewed.  Records reviewed and summated above. Stroke: Continue secondary stroke prophylaxis and Risk Factor Modification listed below:   Antiplatelet therapy Blood Pressure Management:  Continue current medication with prn's with permisive HTN per primary team Statin Agent Diabetes management Right sided hemiparesis Motor recovery: Fluoxetine  1. Does the need for close, 24 hr/day medical supervision in concert with the patient's rehab needs make it unreasonable for this patient to be served in a less intensive setting? Yes  2. Co-Morbidities requiring supervision/potential complications: HTN (monitor and provide prns in accordance with increased physical exertion and pain), type 2 diabetes mellitus (Monitor in accordance with exercise and adjust meds as necessary), coronary  artery disease with stenting (cont  meds), Dysphagia (cont SLP, consider vital stim), low-grade fevers (cont to monitor and consider further workup if necessary), leukocytosis (cont to monitor for signs and symptoms of infection, further workup if indicated), hypokalemia (cont to monitor, replete as necessary), Hyponatremia (cont to monitor, treat if necessary), AKI (avoid nephrotoxic meds), tachypnea (monitor RR and O2 Sats with increased physical exertion) 3. Due to safety, disease management and patient education, does the patient require 24 hr/day rehab nursing? Yes 4. Does the patient require coordinated care of a physician, rehab nurse, PT (1-2 hrs/day, 5 days/week), OT (1-2 hrs/day, 5 days/week) and SLP (1-2 hrs/day, 5 days/week) to address physical and functional deficits in the context of the above medical diagnosis(es)? Yes Addressing deficits in the following areas: balance, endurance, locomotion, strength, transferring, bathing, toileting, swallowing and psychosocial support 5. Can the patient actively participate in an intensive therapy program of at least 3 hrs of therapy per day at least 5 days per week? Yes 6. The potential for patient to make measurable gains while on inpatient rehab is excellent 7. Anticipated functional outcomes upon discharge from inpatient rehab are supervision and min assist  with PT, supervision and min assist with OT, modified independent with SLP. 8. Estimated rehab length of stay to reach the above functional goals is: 20-23 days. 9. Does the patient have adequate social supports and living environment to accommodate these discharge functional goals? Potentially 10. Anticipated D/C setting: Home 11. Anticipated post D/C treatments: HH therapy and Home excercise program 12. Overall Rehab/Functional Prognosis: good  RECOMMENDATIONS: This patient's condition is appropriate for continued rehabilitative care in the following setting: CIR once medical conditions  stabalize. Patient has agreed to participate in recommended program. Yes Note that insurance prior authorization may be required for reimbursement for recommended care.  Comment: Rehab Admissions Coordinator to follow up.  Delice Lesch, MD 06/20/2015

## 2015-06-20 NOTE — Progress Notes (Signed)
Occupational Therapy Evaluation Patient Details Name: Herbert Marquez MRN: [id number] DOB: [birth year] Today's Date: 06/20/2015    History of Present Illness Patient is an 82 yo male admitted 06/16/15 after syncopal episode with transient Rt weakness and AMS.  Patient then developed more significant symptoms of Rt-sided weakness, confusion.  MRI showed Lt thalamic infarct, and repeat MRI showed bilateral PCA infarcts.    PMH:  HTN, DM, TIA, MI   Clinical Impression   PTA, pt independent with ADL and mod I with mobility @ cane level. Pt currently requires mod A +2 with stand pivot transfer and Mod A with ADL. Began pt/family education regarding functional use of RUE and compensatory techniques for ADL. Pt will benefit from rehab at CIR to maximize functional level of independence to facilitate safe D/C home with wife who can provide 24/7 S. Will follow acutely to address established goals. HR varied from 90s-147 during session. Did not appear associated with increase in activity.    Follow Up Recommendations  CIR;Supervision/Assistance - 24 hour    Equipment Recommendations  3 in 1 bedside comode    Recommendations for Other Services Rehab consult     Precautions / Restrictions Precautions Precautions: Fall Restrictions Weight Bearing Restrictions: No      Mobility Bed Mobility Overal bed mobility: Needs Assistance Bed Mobility: Rolling;Sit to Supine Rolling: Min guard Sidelying to sit: Mod assist   Sit to supine: Min assist   General bed mobility comments: Pt able to lift BLE back onto bed  Transfers Overall transfer level: Needs assistance Equipment used: 2 person hand held assist Transfers: Sit to/from [facility] Sit to Stand: Mod assist;+2 physical assistance Stand pivot transfers: Max assist;+2 physical assistance  Pt able to initiate scott with delay on R; unaware of location of RUE during transfer       General transfer comment: Difficulty  maintaining RLE control due to apparent deficits with sensation and proprioception and generalized weakness    Balance Overall balance assessment: Needs assistance Sitting-balance support: Single extremity supported Sitting balance-Leahy Scale: Fair     Standing balance support: Bilateral upper extremity supported Standing balance-Leahy Scale: Poor                              ADL Overall ADL's : Needs assistance/impaired Eating/Feeding: Minimal assistance (able to feed self with L nondominant hand)   Grooming: Minimal assistance Grooming Details (indicate cue type and reason): Pt shaved self using L hand Upper Body Bathing: Minimal assitance;Sitting   Lower Body Bathing: Moderate assistance;Sit to/from stand   Upper Body Dressing : Moderate assistance;Sitting   Lower Body Dressing: Maximal assistance;Sit to/from stand   Toilet Transfer: +2 for physical assistance;Maximal assistance;Stand-pivot   Toileting- Clothing Manipulation and Hygiene: Maximal assistance       Functional mobility during ADLs: +2 for physical assistance;Maximal assistance General ADL Comments: Educated wife/pt on always being aware of location of RUE due to sensory loss to prevent injury; demonstrated use of hand over hand technique to feed self with R hand. Using this technique, pt able to drink from cup using straw and feed self applesauce; for mobility, recommend staff use "stedy" for mobility     Vision Additional Comments: Need to assess   Perception  will further assess; appears intact   Praxis Praxis Praxis-Other Comments: appears intact during functional tasks    Pertinent Vitals/Pain Pain Assessment: No/denies pain     Hand Dominance Right  Extremity/Trunk Assessment Upper Extremity Assessment Upper Extremity Assessment: RUE deficits/detail RUE Deficits / Details: Apparent ataxia; unable to complete hand to nose due to poorly controlled coordinationi; generalized  weakness but able to complete full AROM; c/o R wrist pain from "arthritis". RUE Sensation: decreased light touch;decreased proprioception RUE Coordination: decreased fine motor;decreased gross motor (unable to coordinate thumb to finger tip opposition)   Lower Extremity Assessment Lower Extremity Assessment: RLE deficits/detail RLE Sensation: decreased proprioception;decreased light touch   Cervical / Trunk Assessment Cervical / Trunk Assessment: Other exceptions Cervical / Trunk Exceptions: R bias - minimal   Communication Communication Communication: HOH (wears hearing aid)   Cognition Arousal/Alertness: Awake/alert Behavior During Therapy: WFL for tasks assessed/performed Overall Cognitive Status: Impaired/Different from baseline Area of Impairment: Attention;Safety/judgement;Problem solving   Current Attention Level: Selective Memory:  (will further assess)   Safety/Judgement: Decreased awareness of safety   Problem Solving: Slow processing General Comments: Will further assess   General Comments       Exercises       Shoulder Instructions      Home Living Family/patient expects to be discharged to:: Private residence Living Arrangements: Spouse/significant other Available Help at Discharge: Family;Available 24 hours/day Type of Home: House Home Access: Stairs to enter [employer] of Steps: 2 Entrance Stairs-Rails: Right Home Layout: One level     Bathroom Shower/Tub: Occupational psychologist: Handicapped height Bathroom Accessibility: Yes How Accessible: Accessible via walker Home Equipment: Home Garden - 2 wheels;Cane - single point (Does not have BSC or shower seat)          Prior Functioning/Environment Level of Independence: Independent with assistive device(s)        Comments: Uses cane for gait.    OT Diagnosis: Generalized weakness;Ataxia;Hemiplegia dominant side   OT Problem List: Decreased strength;Decreased activity  tolerance;Impaired balance (sitting and/or standing);Decreased coordination;Decreased cognition;Decreased safety awareness;Decreased knowledge of use of DME or AE;Cardiopulmonary status limiting activity;Impaired sensation;Impaired UE functional use   OT Treatment/Interventions: Self-care/ADL training;Therapeutic exercise;Neuromuscular education;DME and/or AE instruction;Therapeutic activities;Cognitive remediation/compensation;Patient/family education;Balance training    OT Goals(Current goals can be found in the care plan section) Acute Rehab OT Goals Patient Stated Goal: to be independent again OT Goal Formulation: With patient/family Time For Goal Achievement: 07/04/15 Potential to Achieve Goals: Good  OT Frequency: Min 2X/week   Barriers to D/C:            Co-evaluation              End of Session Equipment Utilized During Treatment: Gait belt Nurse Communication: Mobility status;Need for lift equipment (stedy)  Activity Tolerance: Treatment limited secondary to medical complications (Comment) Patient left: in bed;with call bell/phone within reach;with family/visitor present   Time: [id number]; MD visit then 10:30-10:39) OT Time Calculation (min): 26 min total Charges:  OT General Charges $OT Visit: 1 Procedure OT Evaluation $OT Eval Moderate Complexity: 1 Procedure OT Treatments $Self Care/Home Management : 8-22 mins G-Codes:    Herbert Marquez,HILLARY 07-10-2015, 11:09 AM Maurie Boettcher, OTR/L  [phone] 07/10/2015

## 2015-06-20 NOTE — Consult Note (Signed)
Cardiology Consult    Patient ID: Herbert Marquez MRN: [id number], DOB/AGE: 09-12-1927   Admit date: 06/16/2015 Date of Consult: 06/20/2015  Primary Physician: Thressa Sheller, MD Reason for Consult: Tachycardia Primary Cardiologist: Dr. Acie Fredrickson Requesting Provider: Dr. Doyle Askew   History of Present Illness    Herbert Marquez is a 82 y.o. male with past medical history of CAD (s/p PCI of RCA in 2006), HTN, HLD, and Type 2 DM who presented to [facility] ED on 06/16/2015 for dizziness and loss of consciousness.  While in the ED, the patient's family noted he was not acting himself and had a blank stare and a right arm drift. CODE STROKE was activated but he did not receive tPA due to his symptoms resolving within minutes.  His MRI showed an acute 8x43mm left thalamic infarct. He continued to have episodes of altered mental status after being admitted and subsequent imaging on 06/17/2015 showed bilateral PCA infarcts as well. The stroke team has been following.   On 06/19/2015, he was noted to be febrile with a HR up to 120's and WBC trending up to 12.6 (currently at 14.2). He was started on Vancomycin and Zosyn for sepsis. Chest CT showed chronic fibrotic changes without acute abnormality. Lactic Acid at 1.9. UA without acute abnormalities. Echo this admission shows a preserved EF of 55% to 60% with normal wall motion, moderately dilated LA and mildly dilated RA.   Cardiology is asked to evaluate today for tachycardia. Over the previous few days, he has been tachycardiac into the 120's - 130's. Today, his HR has mostly been in the 70's - 80's with frequent PVC's. Denies any chest pain or palpitations with elevated HR.  The patient reports a history of tachycardia and PVC's, saying that is why he has been on Metoprolol prior to admission . He was on Toprol-XL 100mg  daily but this was discontinued secondary to hypotension. His BP has been labile today with BP ranging from 84/58 - 141/76 in the past 24  hours.  He was  last seen by Dr. Acie Fredrickson on 05/10/2015 and doing well at that time. Denied any recent chest pain. Reported mild dyspnea with exertion. Reported going to the [facility] 2-3 times a week.   Past Medical History   Past Medical History  Diagnosis Date  . Coronary artery disease   . Hyperlipidemia   . Hypertension   . BPH (benign prostatic hypertrophy)   . TIA (transient ischemic attack)     Approximately 6 weeks post-cardiac catheterization.   . MI (myocardial infarction) ([place]) 10/2004  . Type II diabetes mellitus ([place])     "prediabetic; lost alot of weight; not diabetic now" (06/16/2015)  . GERD (gastroesophageal reflux disease)   . Arthritis     "pretty much all over"   . Basal cell carcinoma     "several burned off his body, face, head"    Past Surgical History  Procedure Laterality Date  . Coronary angioplasty with stent placement  10/2004    stenting x 2 to RCA  . Cataract extraction, bilateral    . Cardiovascular stress test  07/01/2007    EF 74%  . Hip arthroplasty  03/09/2011    Procedure: ARTHROPLASTY BIPOLAR HIP;  Surgeon: Mauri Pole;  Location: [facility]S;  Service: Orthopedics;  Laterality: Left;  . Hernia repair    . Laparoscopic incisional / umbilical / ventral hernia repair      "below his naval"     Allergies  No Known  Allergies  Inpatient Medications    . aspirin EC  325 mg Oral Daily  . atorvastatin  80 mg Oral Daily  . clopidogrel  75 mg Oral Daily  . enoxaparin (LOVENOX) injection  40 mg Subcutaneous Q24H  . insulin aspart  0-9 Units Subcutaneous TID WC  . LORazepam  1 mg Intravenous Once  . LORazepam  0.5 mg Oral Once  . pantoprazole  40 mg Oral Daily  . pioglitazone  15 mg Oral Daily  . piperacillin-tazobactam (ZOSYN)  IV  3.375 g Intravenous Q8H  . potassium chloride  40 mEq Oral Once  . sodium chloride flush  3 mL Intravenous Q12H    Family History    Family History  Problem Relation Age of Onset  . Lung cancer Father   . Lung  cancer Brother   . Hypertension Mother     Social History    Social History   Social History  . Marital Status: Married    Spouse Name: N/A  . Number of Children: N/A  . Years of Education: N/A   Occupational History  . Retired Other   Social History Main Topics  . Smoking status: Former Smoker    Quit date: 03/06/1971  . Smokeless tobacco: Never Used  . Alcohol Use: 7.2 oz/week    6 Glasses of wine, 6 Cans of beer per week  . Drug Use: No  . Sexual Activity: No   Other Topics Concern  . Not on file   Social History Narrative   Lives in [place], [place] with wife. Has 3 children.      Review of Systems    General:  No chills, fever, night sweats or weight changes.  Cardiovascular:  No chest pain, dyspnea on exertion, edema, orthopnea, palpitations, paroxysmal nocturnal dyspnea. Dermatological: No rash, lesions/masses Respiratory: No cough, dyspnea Urologic: No hematuria, dysuria Abdominal:   No nausea, vomiting, diarrhea, bright red blood per rectum, melena, or hematemesis Neurologic:  No visual changes, Positive for wkns and changes in mental status. All other systems reviewed and are otherwise negative except as noted above.  Physical Exam    Blood pressure 104/65, pulse 81, temperature 98.4 F (36.9 C), temperature source Oral, resp. rate 18, height 5\' 7"  (1.702 m), weight 196 lb 3.4 oz (89 kg), SpO2 100 %.  General: Pleasant, Caucasian male appearing in NAD Psych: Normal affect. Neuro: Alert and oriented X 3. Moves all extremities spontaneously. HEENT: Normal  Neck: Supple without bruits or JVD. Lungs:  Resp regular and unlabored, decreased breath sounds at the bases bilaterally. Heart: RRR no s3, s4, or murmurs. Abdomen: Soft, non-tender, non-distended, BS + x 4.  Extremities: No clubbing, cyanosis or edema. DP/PT/Radials 2+ and equal bilaterally. Decreased strength in RUE.  Labs    Troponin (Point of Care Test) No results for input(s): TROPIPOC in  the last 72 hours.  Recent Labs  06/20/15 1115  TROPONINI 0.03   Lab Results  Component Value Date   WBC 14.2* 06/20/2015   HGB 12.2* 06/20/2015   HCT 37.8* 06/20/2015   MCV 95.9 06/20/2015   PLT 288 06/20/2015    Recent Labs Lab 06/20/15 0353  NA 132*  K 3.3*  CL 99*  CO2 21*  BUN 12  CREATININE 1.31*  CALCIUM 8.6*  GLUCOSE 125*   Lab Results  Component Value Date   CHOL 106 06/17/2015   HDL 34* 06/17/2015   LDLCALC 53 06/17/2015   TRIG 95 06/17/2015   No results found for:  DDIMER   Radiology Studies    Ct Angio Head W/cm &/or Wo Cm: 06/17/2015  CLINICAL DATA:  Code stroke, follow-up examination. Lethargy and confusion. EXAM: CT ANGIOGRAPHY HEAD TECHNIQUE: Multidetector CT imaging of the head was performed using the standard protocol during bolus administration of intravenous contrast. Multiplanar CT image reconstructions and MIPs were obtained to evaluate the vascular anatomy. CONTRAST:  50 cc Isovue 370 COMPARISON:  MRI and MRA head June 16, 2015 FINDINGS: CTA NECK (moderately motion degraded examination) Aortic arch: Normal appearance of the thoracic arch, normal branch pattern. Moderate calcific atherosclerosis of the aortic arch. The origins of the innominate, left Common carotid artery and subclavian artery are widely patent. Right carotid system: Common carotid artery is widely patent, coursing in a straight line fashion. Approximate 50% stenosis RIGHT internal carotid artery by NASCET criteria though limited by motion. Normal appearance of the included internal carotid artery. Left carotid system: Common carotid artery is widely patent, coursing in a straight line fashion. Probable moderate to high-grade stenosis LEFT internal carotid artery ; due to motion, limited quantification. Normal appearance of the included internal carotid artery. Vertebral arteries:Left vertebral artery is dominant. Normal appearance of the vertebral arteries, which appear widely patent.  Skeleton: No acute osseous process though bone windows have not been submitted. Multiple absent teeth. Grade 2 C7-T1 anterolisthesis on degenerative basis. Grade 1 C4-5, C5-6 anterolisthesis. Multilevel moderate to severe degenerative discs and moderate to severe facet arthropathy. Other neck: Soft tissues of the neck are non-acute though, not tailored for evaluation. Partially imaged centrilobular emphysema and pleural thickening. CTA HEAD (moderately motion degraded examination) Anterior circulation: Patent bilateral internal carotid arteries with severe calcific atherosclerosis in at least mild stenosis. Developmentally absent LEFT A1 segment, bilateral anterior cerebral arteries arise from RIGHT A1-2 junction appeared normal. Normal appearance of middle cerebral arteries. Posterior circulation: Thready flow proximal RIGHT V4 segment, occluded in mid segment. LEFT vertebral artery is widely patent. Normal appearance the basilar artery, main branch vessels are patent though limited assessment due to motion. Complete loss of LEFT posterior cerebral artery contrast opacification at the origin. Occluded proximal RIGHT P3 segment. No dissection, luminal irregularity, contrast extravasation or aneurysm within the anterior nor posterior circulation. IMPRESSION: CTA NECK: Moderately motion degraded examination. Probable moderate to high-grade stenosis LEFT internal carotid artery origin, limited by motion. Less than 50% stenosis RIGHT internal carotid artery origin. CTA HEAD: Moderately motion degraded examination. Occluded RIGHT V4 segment. Occluded LEFT posterior cerebral artery at origin. Occluded proximal RIGHT P3 segment. Electronically Signed   By: Elon Alas M.D.   On: 06/17/2015 02:25   Ct Head Wo Contrast: 06/16/2015  CLINICAL DATA:  Code stroke. Acute onset of right-sided weakness. Initial encounter. EXAM: CT HEAD WITHOUT CONTRAST TECHNIQUE: Contiguous axial images were obtained from the base of the  skull through the vertex without intravenous contrast. COMPARISON:  MRI of the brain and CT of the head performed 03/14/2005 FINDINGS: There is no evidence of acute infarction, mass lesion, or intra- or extra-axial hemorrhage on CT. Prominence of ventricles and sulci reflects mild to moderate cortical volume loss. Mild periventricular white matter change likely reflects small vessel ischemic microangiopathy. Chronic ischemic change is noted at the left external capsule. Mild cerebellar atrophy is noted. The brainstem and fourth ventricle are within normal limits. The cerebral hemispheres demonstrate grossly normal gray-white differentiation. No mass effect or midline shift is seen. There is no evidence of fracture; visualized osseous structures are unremarkable in appearance. The orbits are within normal limits.  The paranasal sinuses and mastoid air cells are well-aerated. No significant soft tissue abnormalities are seen. IMPRESSION: 1. No acute intracranial pathology seen on CT. 2. Mild to moderate cortical volume loss and scattered small vessel ischemic microangiopathy. 3. Chronic ischemic change at the left external capsule. These results were called by telephone at the time of interpretation on 06/16/2015 at 7:32 pm to Dr. Nicole Kindred, who verbally acknowledged these results. Electronically Signed   By: Garald Balding M.D.   On: 06/16/2015 19:35   Ct Angio Neck W/cm &/or Wo/cm: 06/17/2015  CLINICAL DATA:  Code stroke, follow-up examination. Lethargy and confusion. EXAM: CT ANGIOGRAPHY HEAD TECHNIQUE: Multidetector CT imaging of the head was performed using the standard protocol during bolus administration of intravenous contrast. Multiplanar CT image reconstructions and MIPs were obtained to evaluate the vascular anatomy. CONTRAST:  50 cc Isovue 370 COMPARISON:  MRI and MRA head June 16, 2015 FINDINGS: CTA NECK (moderately motion degraded examination) Aortic arch: Normal appearance of the thoracic arch, normal  branch pattern. Moderate calcific atherosclerosis of the aortic arch. The origins of the innominate, left Common carotid artery and subclavian artery are widely patent. Right carotid system: Common carotid artery is widely patent, coursing in a straight line fashion. Approximate 50% stenosis RIGHT internal carotid artery by NASCET criteria though limited by motion. Normal appearance of the included internal carotid artery. Left carotid system: Common carotid artery is widely patent, coursing in a straight line fashion. Probable moderate to high-grade stenosis LEFT internal carotid artery ; due to motion, limited quantification. Normal appearance of the included internal carotid artery. Vertebral arteries:Left vertebral artery is dominant. Normal appearance of the vertebral arteries, which appear widely patent. Skeleton: No acute osseous process though bone windows have not been submitted. Multiple absent teeth. Grade 2 C7-T1 anterolisthesis on degenerative basis. Grade 1 C4-5, C5-6 anterolisthesis. Multilevel moderate to severe degenerative discs and moderate to severe facet arthropathy. Other neck: Soft tissues of the neck are non-acute though, not tailored for evaluation. Partially imaged centrilobular emphysema and pleural thickening. CTA HEAD (moderately motion degraded examination) Anterior circulation: Patent bilateral internal carotid arteries with severe calcific atherosclerosis in at least mild stenosis. Developmentally absent LEFT A1 segment, bilateral anterior cerebral arteries arise from RIGHT A1-2 junction appeared normal. Normal appearance of middle cerebral arteries. Posterior circulation: Thready flow proximal RIGHT V4 segment, occluded in mid segment. LEFT vertebral artery is widely patent. Normal appearance the basilar artery, main branch vessels are patent though limited assessment due to motion. Complete loss of LEFT posterior cerebral artery contrast opacification at the origin. Occluded  proximal RIGHT P3 segment. No dissection, luminal irregularity, contrast extravasation or aneurysm within the anterior nor posterior circulation. IMPRESSION: CTA NECK: Moderately motion degraded examination. Probable moderate to high-grade stenosis LEFT internal carotid artery origin, limited by motion. Less than 50% stenosis RIGHT internal carotid artery origin. CTA HEAD: Moderately motion degraded examination. Occluded RIGHT V4 segment. Occluded LEFT posterior cerebral artery at origin. Occluded proximal RIGHT P3 segment. Electronically Signed   By: Elon Alas M.D.   On: 06/17/2015 02:25   Ct Chest Wo Contrast: 06/19/2015  CLINICAL DATA:  Tachycardia and low-grade fever EXAM: CT CHEST WITHOUT CONTRAST TECHNIQUE: Multidetector CT imaging of the chest was performed following the standard protocol without IV contrast. COMPARISON:  03/09/2011 FINDINGS: Lungs are well aerated bilaterally. Some mild fibrotic changes are seen. No focal confluent infiltrate or sizable effusion is seen. No parenchymal nodules are noted. The thoracic inlet is within normal limits. The thoracic aorta and its  branches demonstrate calcific changes without aneurysmal dilatation. The coronary arteries demonstrate heavy calcifications. No hilar or mediastinal adenopathy is noted. The visualized upper abdomen reveals no acute abnormality. The osseous structures show degenerative change of the thoracic spine. IMPRESSION: Chronic fibrotic changes without acute abnormality. Electronically Signed   By: Inez Catalina M.D.   On: 06/19/2015 17:43   Mr Jodene Nam Head Wo Contrast: 06/16/2015  CLINICAL DATA:  Brief loss of consciousness at home this afternoon. Subsequently RIGHT-sided weakness and facial droop. History of hypertension, diabetes. EXAM: MRI HEAD WITHOUT AND WITH CONTRAST AND MRA HEAD WITHOUT CONTRAST MRI NECK WITHOUT AND WITH CONTRAST TECHNIQUE: Multiplanar, multiecho pulse sequences of the brain and surrounding structures were obtained  without and with intravenous contrast. Angiographic images of the head were obtained using MRA technique without contrast. Multiplanar, multiecho pulse sequences of the neck and surrounding structures were obtained without and with intravenous contrast. CONTRAST:  61mL MULTIHANCE GADOBENATE DIMEGLUMINE 529 MG/ML IV SOLN COMPARISON:  CT head June 16, 2015 an MRI/MRA of the head March 14, 2005 FINDINGS: MRI HEAD FINDINGS Mild motion degraded examination. 8 x 12 mm focus of reduced diffusion LEFT thalamus, corresponding low ADC values. The ventricles and sulci are normal for patient's age. No abnormal parenchymal signal, mass lesions, mass effect. Patchy supratentorial white matter FLAIR T2 hyperintensities are less than expected for age. No susceptibility artifact to suggest hemorrhage. Tubular susceptibility artifact LEFT ambient cistern. No abnormal intracranial enhancement though, post contrast sequences are moderately to severely motion degraded. No abnormal extra-axial fluid collections. No extra-axial masses though, contrast enhanced sequences would be more sensitive. Normal major intracranial vascular flow voids seen at the skull base. Status post bilateral ocular lens implants. No abnormal sellar expansion. No suspicious calvarial bone marrow signal. Craniocervical junction maintained. Visualized paranasal sinuses and mastoid air cells are well-aerated. Severe RIGHT temporomandibular osteoarthrosis with effusion. MRA HEAD FINDINGS Moderately motion degraded examination. Anterior circulation: Normal flow related enhancement of the included cervical, petrous, cavernous and supraclinoid internal carotid arteries. Patent anterior communicating artery. Normal flow related enhancement of the anterior and middle cerebral arteries, limited assessment of the mid to distal segments due to motion. No large vessel occlusion, high-grade stenosis, abnormal luminal irregularity, aneurysm. Posterior circulation: LEFT  vertebral artery is dominant. Basilar artery is patent, with normal flow related enhancement of the main branch vessels. No flow related enhancement of LEFT posterior cerebral artery, as compatible with occlusion. High-grade stenosis RIGHT mid P2 segment, with probable slow flow distally. No large vessel occlusion, high-grade stenosis, abnormal luminal irregularity, aneurysm. MRI NECK FINDINGS Mildly motion degraded examination. Source images and MIP image were reviewed. The common carotid arteries are widely patent bilaterally. The carotid bifurcation is patent bilaterally and there is no significant carotid stenosis. Luminal irregularity of the bilateral internal carotid arteries compatible with atherosclerosis. Limited assessment vertebral artery origins due to patient motion. Both vertebral arteries and patent to the basilar without significant vertebral stenosis. There is no evidence for atherosclerosis or flow limiting stenosis. IMPRESSION: MRI HEAD: Mildly motion degraded examination. Acute 8 x 12 mm LEFT thalamic infarct. Involutional changes. Mild to moderate chronic small vessel ischemic disease. MRA HEAD: Occluded LEFT posterior cerebral artery, given MRI findings, this is likely acute. High-grade stenosis RIGHT P2 segment, with presumed slow flow distally. MRA NECK: Mildly motion degraded examination. No high-grade stenosis. Motion and presumed atherosclerosis resulting in luminal irregularity. Acute findings discussed with and reconfirmed by Dr.EMILY NGUYEN on 06/16/2015 at 11:07 pm. Electronically Signed   By: Elon Alas  M.D.   On: 06/16/2015 23:06   Mr Brain Ltd W/o Cm: 06/17/2015  CLINICAL DATA:  Progressive right-sided weakness.  Known infarct. EXAM: MRI HEAD WITHOUT CONTRAST TECHNIQUE: Multiplanar, multiecho pulse sequences of the brain and surrounding structures were obtained without intravenous contrast. COMPARISON:  MRI brain 06/16/2015. CTA of the head and neck 06/17/2015. FINDINGS: The  previously noted left colonic infarct is more clearly delineated on today's study. There is no significant change in size of the lesion which measures 14 mm maximally. A medial right thalamic infarct measuring 9 mm maximally is also better seen on today's study. A 7 mm medial right temporal lobe infarct is new since the previous study. IMPRESSION: 1. Increased conspicuity of the left splenic lesion without significant change and size. 2. No new left-sided infarcts. 3. A 9 mm medial right thalamic infarct is better visualized on today's study. 4. New 7 mm medial right temporal lobe infarct suggesting ongoing disease. Electronically Signed   By: San Morelle M.D.   On: 06/17/2015 12:55    EKG & Cardiac Imaging    EKG: Sinus tachycardia, HR 116, 1st degree AV Block, RBBB with LAFB noted.  Echocardiogram: 06/17/2015 Study Conclusions - Left ventricle: The cavity size was normal. Wall thickness was  normal. Systolic function was normal. The estimated ejection  fraction was in the range of 55% to 60%. Wall motion was normal;  there were no regional wall motion abnormalities. Doppler  parameters are consistent with abnormal left ventricular  relaxation (grade 1 diastolic dysfunction). - Aortic valve: Mildly calcified annulus. - Left atrium: The atrium was moderately dilated. - Right atrium: The atrium was mildly dilated.  Assessment & Plan    1. Sinus Tachycardia/ Episodes of SVT  - reports a history of tachycardia and PVC's. HR has been elevated into the 130's - 140's at times and the patient is asymptomatic. Started occurring in the setting of SIRS. Improved today with HR mostly in the 70's - 80's today. - denies any prior history of atrial fibrillation/ atrial flutter. None noted on telemetry. Would concur with 30-day event monitor at time of discharge as previously recommended by Neurology for CVA. - EKG shows RBBB with LAFB, with RBBB being new since tracing in 2017. - likely  exacerbated by SIRS. Episode this AM most consistent with SVT. Prior episodes yesterday consistent with Sinus Tachycardia, as p-waves are evident. Was on Toprol-XL 100mg  daily PTA. Will restart low-dose BB with Metoprolol 12.5 mg BID with hold parameters and titrate as BP allows.   2. History of CAD - s/p PCI of RCA in 2006. Denies any recent anginal symptoms. - continue ASA and statin. BB dosing as above.  3. SIRS - On 06/18/2012, he became febrile with a HR up to 120's and WBC trending up to 12.6 (currently at 14.2).  - Started on Vancomycin and Zosyn for sepsis.  - Chest CT and UA with acute abnormalities. - per admitting team  4. Acute CVA - MRI on 06/16/2015 showed an acute 8x57mm left thalamic infarct. Subsequent imaging on 06/17/2015 showed bilateral PCA infarcts as well.  - started on ASA and Plavix. - per Neurology.  Signed, Erma Heritage, PA-C 06/20/2015, 2:34 PM Pager: [phone]  Patient seen, examined. Available data reviewed. Agree with findings, assessment, and plan as outlined by Bernerd Pho, PA-C. The patient is independently interviewed and examined. Exam shows an alert, oriented male in NAD. Lungs coarse but clear. Heart RRR without murmur. Abdomen soft and NT, extremities without edema. His  wife is at the bedside. Tele is reviewed and shows sinus tachycardia yesterday. Today, the patient had episodes of short RP narrow complex tachycardia, now in sinus. Agree with restarting metoprolol at low dose with hold parameters as outlined above. Echo reviewed and shows normal LV function without significant valvular disease. Otherwise supportive care is indicated for this patient with recent stroke. Will follow with you. thx  Sherren Mocha, M.D. 06/20/2015 5:47 PM

## 2015-06-20 NOTE — Progress Notes (Signed)
1000 PT HR sustaining 130-140's SBP drop 80-90's SOB with communication, RR 15-24 No change in mentation, got patient back to bed. Paged Dr. Doyle Askew will continue to monitor.   1015 HR back in 80's BP 102/74 Will continue to monitor.

## 2015-06-20 NOTE — Progress Notes (Signed)
Speech Language Pathology Treatment: Dysphagia  Patient Details Name: Herbert Marquez MRN: [id number] DOB: 06/15/1927 Today's Date: 06/20/2015 Time: [id number] SLP Time Calculation (min) (ACUTE ONLY): 17 min  Assessment / Plan / Recommendation Clinical Impression  Pt consumed soft solids and thin liquids with immediate cough x1 following consecutive straw sips. Min cues for slower rate of intake and smaller sip size eliminated further signs of aspiration. Mastication and oral clearance is adequate with solids, although pt and wife prefer that he remain on current chopped diet at this time. Will continue to monitor for diet tolerance and diet advancement.   HPI HPI: Herbert Marquez is an 82 y.o. male with a history of hypertension, hyperlipidemia, TIA and myocardial infarction, who was brought to the emergency room following an episode of loss of consciousness at home. Episode occured at 3:30 PM 06/16/2014 ([facility]). Initial evaluation ED was unremarkable. Patient subsequently developed weakness involving his right side with facial droop noticed as well as reduced strength of right extremities transiently. MRI confirmed L thalamic infarct. However, pt experienced neuro decline over night and there is question of extension.      SLP Plan  Continue with current plan of care     Recommendations  Diet recommendations: Dysphagia 2 (fine chop);Thin liquid Liquids provided via: Cup;Straw Medication Administration: Whole meds with puree Supervision: Full supervision/cueing for compensatory strategies;Staff to assist with self feeding Compensations: Slow rate;Small sips/bites Postural Changes and/or Swallow Maneuvers: Seated upright 90 degrees;Upright 30-60 min after meal             Oral Care Recommendations: Oral care BID Follow up Recommendations: Inpatient Rehab Plan: Continue with current plan of care     GO               Germain Osgood, M.A. CCC-SLP [phone]  Germain Osgood 06/20/2015, 4:49 PM

## 2015-06-20 NOTE — Care Management Important Message (Signed)
Important Message  Patient Details  Name: KERT MAKOSKY MRN: [id number] Date of Birth: 1927-08-04   Medicare Important Message Given:  Yes    Roneshia Drew P Hickman 06/20/2015, 1:51 PM

## 2015-06-20 NOTE — Progress Notes (Signed)
Rehab admissions - I am following for potential acute inpatient rehab admission.  Patient not medically ready yet for rehab.  I will follow up again tomorrow.  Call me for questions.  [phone]

## 2015-06-21 ENCOUNTER — Encounter (HOSPITAL_COMMUNITY): Payer: Self-pay | Admitting: *Deleted

## 2015-06-21 ENCOUNTER — Inpatient Hospital Stay (HOSPITAL_COMMUNITY)
Admission: RE | Admit: 2015-06-21 | Discharge: 2015-07-09 | DRG: 057 | Disposition: A | Discharge status: Home Health | Payer: Medicare Other | Source: Intra-hospital | Attending: Physical Medicine & Rehabilitation | Admitting: Physical Medicine & Rehabilitation

## 2015-06-21 DIAGNOSIS — Z955 Presence of coronary angioplasty implant and graft: Secondary | ICD-10-CM | POA: Diagnosis not present

## 2015-06-21 DIAGNOSIS — I6381 Other cerebral infarction due to occlusion or stenosis of small artery: Secondary | ICD-10-CM

## 2015-06-21 DIAGNOSIS — Z8673 Personal history of transient ischemic attack (TIA), and cerebral infarction without residual deficits: Secondary | ICD-10-CM

## 2015-06-21 DIAGNOSIS — I639 Cerebral infarction, unspecified: Secondary | ICD-10-CM | POA: Diagnosis not present

## 2015-06-21 DIAGNOSIS — I69392 Facial weakness following cerebral infarction: Secondary | ICD-10-CM | POA: Diagnosis not present

## 2015-06-21 DIAGNOSIS — N281 Cyst of kidney, acquired: Secondary | ICD-10-CM

## 2015-06-21 DIAGNOSIS — N390 Urinary tract infection, site not specified: Secondary | ICD-10-CM | POA: Diagnosis not present

## 2015-06-21 DIAGNOSIS — R1310 Dysphagia, unspecified: Secondary | ICD-10-CM

## 2015-06-21 DIAGNOSIS — Z79899 Other long term (current) drug therapy: Secondary | ICD-10-CM | POA: Diagnosis not present

## 2015-06-21 DIAGNOSIS — R269 Unspecified abnormalities of gait and mobility: Secondary | ICD-10-CM | POA: Diagnosis not present

## 2015-06-21 DIAGNOSIS — E1142 Type 2 diabetes mellitus with diabetic polyneuropathy: Secondary | ICD-10-CM | POA: Diagnosis not present

## 2015-06-21 DIAGNOSIS — K219 Gastro-esophageal reflux disease without esophagitis: Secondary | ICD-10-CM

## 2015-06-21 DIAGNOSIS — Z96642 Presence of left artificial hip joint: Secondary | ICD-10-CM

## 2015-06-21 DIAGNOSIS — Z87891 Personal history of nicotine dependence: Secondary | ICD-10-CM | POA: Diagnosis not present

## 2015-06-21 DIAGNOSIS — R209 Unspecified disturbances of skin sensation: Secondary | ICD-10-CM

## 2015-06-21 DIAGNOSIS — I69393 Ataxia following cerebral infarction: Secondary | ICD-10-CM

## 2015-06-21 DIAGNOSIS — I2510 Atherosclerotic heart disease of native coronary artery without angina pectoris: Secondary | ICD-10-CM | POA: Diagnosis not present

## 2015-06-21 DIAGNOSIS — E785 Hyperlipidemia, unspecified: Secondary | ICD-10-CM | POA: Diagnosis not present

## 2015-06-21 DIAGNOSIS — I10 Essential (primary) hypertension: Secondary | ICD-10-CM | POA: Diagnosis not present

## 2015-06-21 DIAGNOSIS — N400 Enlarged prostate without lower urinary tract symptoms: Secondary | ICD-10-CM

## 2015-06-21 DIAGNOSIS — I69991 Dysphagia following unspecified cerebrovascular disease: Secondary | ICD-10-CM | POA: Diagnosis not present

## 2015-06-21 DIAGNOSIS — R319 Hematuria, unspecified: Secondary | ICD-10-CM | POA: Diagnosis not present

## 2015-06-21 DIAGNOSIS — Z7982 Long term (current) use of aspirin: Secondary | ICD-10-CM

## 2015-06-21 DIAGNOSIS — I69398 Other sequelae of cerebral infarction: Secondary | ICD-10-CM

## 2015-06-21 DIAGNOSIS — Z85828 Personal history of other malignant neoplasm of skin: Secondary | ICD-10-CM | POA: Diagnosis not present

## 2015-06-21 DIAGNOSIS — M1731 Unilateral post-traumatic osteoarthritis, right knee: Secondary | ICD-10-CM | POA: Diagnosis not present

## 2015-06-21 DIAGNOSIS — I69898 Other sequelae of other cerebrovascular disease: Secondary | ICD-10-CM | POA: Diagnosis not present

## 2015-06-21 DIAGNOSIS — H9190 Unspecified hearing loss, unspecified ear: Secondary | ICD-10-CM

## 2015-06-21 DIAGNOSIS — I69351 Hemiplegia and hemiparesis following cerebral infarction affecting right dominant side: Secondary | ICD-10-CM | POA: Diagnosis not present

## 2015-06-21 DIAGNOSIS — E8809 Other disorders of plasma-protein metabolism, not elsewhere classified: Secondary | ICD-10-CM | POA: Diagnosis not present

## 2015-06-21 DIAGNOSIS — K5900 Constipation, unspecified: Secondary | ICD-10-CM

## 2015-06-21 DIAGNOSIS — I69391 Dysphagia following cerebral infarction: Secondary | ICD-10-CM

## 2015-06-21 HISTORY — DX: Other cerebral infarction due to occlusion or stenosis of small artery: I63.81

## 2015-06-21 HISTORY — DX: Cerebral infarction, unspecified: I63.9

## 2015-06-21 LAB — CBC
HCT: 30.6 % — ABNORMAL LOW (ref 39.0–52.0)
Hemoglobin: 10.4 g/dL — ABNORMAL LOW (ref 13.0–17.0)
MCH: 32.4 pg (ref 26.0–34.0)
MCHC: 34 g/dL (ref 30.0–36.0)
MCV: 95.3 fL (ref 78.0–100.0)
Platelets: 238 10*3/uL (ref 150–400)
RBC: 3.21 MIL/uL — ABNORMAL LOW (ref 4.22–5.81)
RDW: 15 % (ref 11.5–15.5)
WBC: 9.7 10*3/uL (ref 4.0–10.5)

## 2015-06-21 LAB — BASIC METABOLIC PANEL
Anion gap: 9 (ref 5–15)
BUN: 13 mg/dL (ref 6–20)
CO2: 21 mmol/L — ABNORMAL LOW (ref 22–32)
Calcium: 8.4 mg/dL — ABNORMAL LOW (ref 8.9–10.3)
Chloride: 103 mmol/L (ref 101–111)
Creatinine, Ser: 1.14 mg/dL (ref 0.61–1.24)
GFR calc Af Amer: >60 mL/min (ref >60)
GFR calc non Af Amer: 57 mL/min — ABNORMAL LOW (ref >60)
Glucose, Bld: 109 mg/dL — ABNORMAL HIGH (ref 65–99)
Potassium: 3.9 mmol/L (ref 3.5–5.1)
Sodium: 133 mmol/L — ABNORMAL LOW (ref 135–145)

## 2015-06-21 LAB — GLUCOSE, CAPILLARY
Glucose-Capillary: 91 mg/dL (ref 65–99)
Glucose-Capillary: 104 mg/dL — ABNORMAL HIGH (ref 65–99)
Glucose-Capillary: 107 mg/dL — ABNORMAL HIGH (ref 65–99)
Glucose-Capillary: 158 mg/dL — ABNORMAL HIGH (ref 65–99)

## 2015-06-21 LAB — URINE CULTURE

## 2015-06-21 MED ORDER — NITROGLYCERIN 0.4 MG SL SUBL: 0.4000 mg | SUBLINGUAL_TABLET | SUBLINGUAL | Status: DC | PRN

## 2015-06-21 MED ORDER — ENOXAPARIN SODIUM 40 MG/0.4ML ~~LOC~~ SOLN
40.0000 mg | SUBCUTANEOUS | Status: DC
  Administered 2015-06-21 – 2015-06-26 (×6): 40 mg via SUBCUTANEOUS
  Filled 2015-06-21 (×6): qty 0.4

## 2015-06-21 MED ORDER — ATORVASTATIN CALCIUM 80 MG PO TABS
80.0000 mg | ORAL_TABLET | Freq: Every day | ORAL | Status: DC
  Administered 2015-06-22 – 2015-07-09 (×18): 80 mg via ORAL
  Filled 2015-06-21 (×7): qty 1
  Filled 2015-06-21: qty 2
  Filled 2015-06-21 (×10): qty 1

## 2015-06-21 MED ORDER — CLOPIDOGREL BISULFATE 75 MG PO TABS: 75.0000 mg | ORAL_TABLET | Freq: Every day | ORAL | Status: DC

## 2015-06-21 MED ORDER — METOPROLOL TARTRATE 12.5 MG HALF TABLET
12.5000 mg | ORAL_TABLET | Freq: Two times a day (BID) | ORAL | Status: DC
  Administered 2015-06-21 – 2015-07-09 (×31): 12.5 mg via ORAL
  Filled 2015-06-21 (×35): qty 1

## 2015-06-21 MED ORDER — METOPROLOL TARTRATE 25 MG PO TABS: 12.5000 mg | ORAL_TABLET | Freq: Two times a day (BID) | ORAL | Status: DC

## 2015-06-21 MED ORDER — TRAMADOL HCL 50 MG PO TABS: 50.0000 mg | ORAL_TABLET | Freq: Four times a day (QID) | ORAL | Status: DC | PRN

## 2015-06-21 MED ORDER — ONDANSETRON HCL 4 MG/2ML IJ SOLN: 4.0000 mg | Freq: Four times a day (QID) | INTRAMUSCULAR | Status: DC | PRN

## 2015-06-21 MED ORDER — PIOGLITAZONE HCL 15 MG PO TABS
15.0000 mg | ORAL_TABLET | Freq: Every day | ORAL | Status: DC
  Administered 2015-06-22 – 2015-07-09 (×18): 15 mg via ORAL
  Filled 2015-06-21 (×18): qty 1

## 2015-06-21 MED ORDER — PANTOPRAZOLE SODIUM 40 MG PO TBEC
40.0000 mg | DELAYED_RELEASE_TABLET | Freq: Every day | ORAL | Status: DC
  Administered 2015-06-22 – 2015-07-09 (×18): 40 mg via ORAL
  Filled 2015-06-21 (×18): qty 1

## 2015-06-21 MED ORDER — SORBITOL 70 % SOLN: 30.0000 mL | Freq: Every day | Status: DC | PRN

## 2015-06-21 MED ORDER — ATORVASTATIN CALCIUM 80 MG PO TABS: 80.0000 mg | ORAL_TABLET | Freq: Every day | ORAL | Status: DC

## 2015-06-21 MED ORDER — ONDANSETRON HCL 4 MG PO TABS: 4.0000 mg | ORAL_TABLET | Freq: Four times a day (QID) | ORAL | Status: DC | PRN

## 2015-06-21 MED ORDER — ASPIRIN 325 MG PO TABS: 325.0000 mg | ORAL_TABLET | Freq: Every day | ORAL | Status: DC

## 2015-06-21 MED ORDER — ASPIRIN 325 MG PO TABS
325.0000 mg | ORAL_TABLET | Freq: Every day | ORAL | Status: DC
  Administered 2015-06-22 – 2015-07-09 (×18): 325 mg via ORAL
  Filled 2015-06-21 (×19): qty 1

## 2015-06-21 MED ORDER — ACETAMINOPHEN 325 MG PO TABS
325.0000 mg | ORAL_TABLET | ORAL | Status: DC | PRN
  Filled 2015-06-21: qty 2

## 2015-06-21 MED ORDER — INSULIN ASPART 100 UNIT/ML ~~LOC~~ SOLN
0.0000 [IU] | Freq: Three times a day (TID) | SUBCUTANEOUS | Status: DC
  Administered 2015-06-25 – 2015-07-02 (×11): 1 [IU] via SUBCUTANEOUS

## 2015-06-21 MED ORDER — ASPIRIN 325 MG PO TABS
325.0000 mg | ORAL_TABLET | Freq: Every day | ORAL | Status: DC
  Administered 2015-06-21: 325 mg via ORAL
  Filled 2015-06-21: qty 1

## 2015-06-21 MED ORDER — ENOXAPARIN SODIUM 40 MG/0.4ML ~~LOC~~ SOLN: 40.0000 mg | SUBCUTANEOUS | Status: DC

## 2015-06-21 MED ORDER — CLOPIDOGREL BISULFATE 75 MG PO TABS
75.0000 mg | ORAL_TABLET | Freq: Every day | ORAL | Status: DC
  Administered 2015-06-22 – 2015-07-09 (×18): 75 mg via ORAL
  Filled 2015-06-21 (×18): qty 1

## 2015-06-21 NOTE — H&P (View-Only) (Signed)
Physical Medicine and Rehabilitation Admission H&P    Chief Complaint  Patient presents with  . Loss of Consciousness  : HPI: Herbert Marquez is a 82 y.o. right handed male with history of hypertension,Tachycardia with PVCs, type 2 diabetes mellitus, coronary artery disease with stenting. Patient lives with spouse, who is only able to provide supervision at discharge. Independent with a cane prior to admission due to knee and hip pain. One level home with 2 steps to entry. Presented 06/16/2015 with syncope, dizziness. No seizure activity noted. Initially in the ED workup unremarkable. Patient subsequently developed right sided weakness and facial droop in the ED with a blank stare and was unresponsive for at least 1 minute. Cranial CT scan negative. Patient did not receive TPA. Troponin negative. MRI of the brain showed acute 8 x 12 mm left thalamic infarct. Chronic small vessel disease. 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 High-grade stenosis right P2 segment. MRA of neck with no high-grade stenosis. CTA of neck probable moderate to high-grade stenosis left internal carotid artery origin, limited by motion. Less than 50% stenosis right internal carotid artery. CTA of head occluded right V4 segment. Occluded left posterior cerebral artery at origin. Occluded proximal right P3 segment. EEG consistent with mild generalized nonspecific cerebral dysfunction/encephalopathy. No seizure activity. Echocardiogram ejection fraction 60% no wall motion abnormalities. Grade 1 diastolic dysfunction. Neurology consulted maintain on aspirin and Plavix for CVA prophylaxis. Subcutaneous Lovenox for DVT prophylaxis. Dysphagia #2 thin liquid diet. On 06/19/2015 patient with low-grade fever and some nonspecific tachycardiaIn the 120s. 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 Cardiology services follow-up low-dose beta blocker with metoprolol added and monitor  closely for hypotension. Sepsis protocol initiated. Lactic acid 1.9. CT of the chest without acute abnormality. White blood cell count 14,200 maintain on broad-spectrum antibiotics. Mild hypokalemia 3.3. Creatinine 1.14. Urine culture negative nitrite. Renal ultrasound no hydronephrosis. 18 mm lower pole right renal cyst recommended follow-up ultrasound 4-6 months to reassess. PhysicalAnd occupational therapy evaluations completed with recommendations of physical medicine rehabilitation consult. Patient was admitted for a comprehensive rehabilitation program  ROS Constitutional: Negative for chills.  HENT: Negative for hearing loss.  Eyes: Negative for blurred vision and double vision.  Respiratory: Negative for cough and shortness of breath.  Cardiovascular: Positive for palpitations. Negative for chest pain and leg swelling.  Gastrointestinal: Positive for constipation. Negative for nausea and vomiting.   GERD  Genitourinary: Positive for urgency. Negative for dysuria and hematuria.  Musculoskeletal: Positive for myalgias and joint pain.  Skin: Negative for rash.  Neurological: Positive for dizziness and weakness. Negative for tremors and headaches.  All other systems reviewed and are negative   Past Medical History  Diagnosis Date  . Coronary artery disease     a. s/p PCI of RCA in 2006  . Hyperlipidemia   . Hypertension   . BPH (benign prostatic hypertrophy)   . TIA (transient ischemic attack)     Approximately 6 weeks post-cardiac catheterization.   . Type II diabetes mellitus ([place])     "prediabetic; lost alot of weight; not diabetic now" (06/16/2015)  . GERD (gastroesophageal reflux disease)   . Arthritis     "pretty much all over"   . Basal cell carcinoma     "several burned off his body, face, head"  . CVA (cerebral infarction)     a. 06/2015: left thalamic and bilateral PCA   Past Surgical History  Procedure Laterality Date  . Coronary angioplasty with stent  placement  10/2004    stenting x 2 to RCA  . Cataract extraction, bilateral    . Cardiovascular stress test  07/01/2007    EF 74%  . Hip arthroplasty  03/09/2011    Procedure: ARTHROPLASTY BIPOLAR HIP;  Surgeon: Mauri Pole;  Location: [facility];  Service: Orthopedics;  Laterality: Left;  . Hernia repair    . Laparoscopic incisional / umbilical / ventral hernia repair      "below his naval"   Family History  Problem Relation Age of Onset  . Lung cancer Father   . Lung cancer Brother   . Hypertension Mother    Social History:  reports that he quit smoking about 44 years ago. He has never used smokeless tobacco. He reports that he drinks about 7.2 oz of alcohol per week. He reports that he does not use illicit drugs. Allergies: No Known Allergies Medications Prior to Admission  Medication Sig Dispense Refill  . aspirin 81 MG tablet Take 81 mg by mouth daily.     [name] atorvastatin (LIPITOR) 40 MG tablet TAKE ONE TABLET BY MOUTH ONCE DAILY 90 tablet 2  . diphenhydrAMINE (BENADRYL) 25 MG tablet Take 25 mg by mouth every 6 (six) hours as needed. Rhinorrhea    . metoprolol succinate (TOPROL-XL) 100 MG 24 hr tablet TAKE ONE TABLET BY MOUTH ONCE DAILY WITH MEALS 90 tablet 3  . Multiple Vitamin (MULTIVITAMIN) tablet Take 1 tablet by mouth daily.     . nitroGLYCERIN (NITROSTAT) 0.4 MG SL tablet DISSOLVE ONE TABLET UNDER THE TONGUE EVERY 5 MINUTES AS NEEDED FOR CHEST PAIN.  DO NOT EXCEED A TOTAL OF 3 DOSES IN 15 MINUTES 25 tablet 3  . omeprazole (PRILOSEC) 20 MG capsule Take 20 mg by mouth daily.     . pioglitazone (ACTOS) 30 MG tablet Take 15 mg by mouth daily.     . Tamsulosin HCl (FLOMAX) 0.4 MG CAPS Take by mouth daily after supper.    . triamterene-hydrochlorothiazide (MAXZIDE-25) 37.5-25 MG tablet Take 1 tablet by mouth daily.      Home: Home Living Family/patient expects to be discharged to:: Private residence Living Arrangements: Spouse/significant other Available Help at Discharge:  Family, Available 24 hours/day Type of Home: House Home Access: Stairs to enter [employer] of Steps: 2 Entrance Stairs-Rails: Right Home Layout: One level Bathroom Shower/Tub: Multimedia programmer: Handicapped height Bathroom Accessibility: Yes Home Equipment: Environmental consultant - 2 wheels, [place] - single point (Does not have BSC or shower seat)   Functional History: Prior Function Level of Independence: Independent with assistive device(s) Comments: Uses cane for gait.  Functional Status:  Mobility: Bed Mobility Overal bed mobility: Needs Assistance Bed Mobility: Rolling, Sit to Supine Rolling: Min guard Sidelying to sit: Mod assist Sit to supine: Min assist Sit to sidelying: Mod assist, +2 for physical assistance General bed mobility comments: Pt seated in the recliner chair upon entering the room.  Transfers Overall transfer level: Needs assistance Equipment used: Rolling walker (2 wheeled), None Transfers: Sit to/from Stand, Stand Pivot Transfers Sit to Stand: +2 physical assistance, Mod assist Stand pivot transfers: +2 physical assistance, Max assist General transfer comment: Two person mod assist to stand from recliner chair with RW.  Right knee blocked and right hand manually assisted to stay on recliner armrest while pushing up and to help find the RW once standing.  Verbal cues for safe hand placement.  Pt sat back down in  preparation for the transfer as the walker was serving as an obstacle and therapist felt safer closer to the pt.  Stand pivot transfer x 2 to the pt's left side giiving him the opportunity to reach with his left hand for the bed rail and then subsequently the armrest on the recliner chair.  Therapist blocked the pt's right knee while he took 1-2 pivotal steps to the bed and then the recliner with max assist to support trunk.  Ambulation/Gait General Gait Details: Unable    ADL: ADL Overall ADL's : Needs assistance/impaired Eating/Feeding:  Minimal assistance (able to feed self with L nondominant hand) Grooming: Minimal assistance Grooming Details (indicate cue type and reason): Pt shaved self using L hand Upper Body Bathing: Minimal assitance, Sitting Lower Body Bathing: Moderate assistance, Sit to/from stand Upper Body Dressing : Moderate assistance, Sitting Lower Body Dressing: Maximal assistance, Sit to/from stand Toilet Transfer: +2 for physical assistance, Maximal assistance, Stand-pivot Toileting- Clothing Manipulation and Hygiene: Maximal assistance Functional mobility during ADLs: +2 for physical assistance, Maximal assistance General ADL Comments: Educated wife/pt o nalways being aware of location of RUE due to sensory loss to prevent injury; demonstrated use of hand over hand technique to feed self with R hand. Using this technique, pt able to drink from cup using straw and feed self applesauce; for mobility, recommend staff use "stedy"  Cognition: Cognition Overall Cognitive Status: Impaired/Different from baseline Orientation Level: Oriented X4 Cognition Arousal/Alertness: Awake/alert Behavior During Therapy: WFL for tasks assessed/performed Overall Cognitive Status: Impaired/Different from baseline Area of Impairment: Attention, Safety/judgement, Problem solving Current Attention Level: Selective Memory:  (will further assess) Safety/Judgement: Decreased awareness of safety Problem Solving: Slow processing General Comments: Pt able to follow commands for pre gait and transfer, does not attend well to right arm or leg unless he is visually looking at them.    Physical Exam: Blood pressure 125/71, pulse 72, temperature 97.9 F (36.6 C), temperature source Oral, resp. rate 20, height '5\' 7"'$  (1.702 m), weight 88.3 kg (194 lb 10.7 oz), SpO2 98 %. Physical Exam Constitutional: He is oriented to person, place, and time. He appears well-developed and well-nourished.  HENT: oral mucosa pink and moist Head:  Normocephalic and atraumatic.  Eyes: Conjunctivae and EOM are normal.  Neck: Normal range of motion. Neck supple. No thyromegaly present.  Cardiovascular: Regular rhythm and rate. No murmurs or rubs Respiratory: Effort normal and breath sounds normal. No respiratory distress. No wheezes rales or rhonchi. GI: Soft. Bowel sounds are normal. He exhibits no distension. Non tender Musculoskeletal: Mild effusion right knee. Chronic changes in patella and along the medial/lateral joint.  Neurological: He is alert and oriented to person, place, and time.  Appears to display reasonable insight and awareness. Somewhat distractible and impulsive at times.  Follows simple commands. Sensation diminished to LT in both the right arm and leg. ?minimal/mild sensory loss distal left leg. Right leg/arm ataxic with general and attempt at more fine motor movements. Impulsive with movement of right side too at times. Motor: LUE: 4+/5 proximal to distal RUE: 4-/5 delt, bicep, tricep, wrist/hand.  LLE: hip flexion 3+/5, knee extension 4/5, ankle dorsi/plantar flexion 4/5 RLE: hip flexion 2+/5, knee extension 3-/5, ankle dorsi/plantarflexion 4-/5  DTR's grossly 1+ throughout.  Skin: Skin is warm and dry. Numerous bruises over the right arm, some on left as well.   Psychiatric: He has a normal mood and affect. His behavior is normal. Very cooperative.    Results for orders placed or performed during the  hospital encounter of 06/16/15 (from the past 48 hour(s))  Urinalysis, Routine w reflex microscopic (not at [facility])     Status: Abnormal   Collection Time: 06/19/15  1:46 PM  Result Value Ref Range   Color, Urine AMBER (A) YELLOW    Comment: BIOCHEMICALS MAY BE AFFECTED BY COLOR   APPearance CLEAR CLEAR   Specific Gravity, Urine 1.027 1.005 - 1.030   pH 5.5 5.0 - 8.0   Glucose, UA NEGATIVE NEGATIVE mg/dL   Hgb urine dipstick NEGATIVE NEGATIVE   Bilirubin Urine SMALL (A) NEGATIVE   Ketones, ur 15 (A) NEGATIVE  mg/dL   Protein, ur 100 (A) NEGATIVE mg/dL   Nitrite NEGATIVE NEGATIVE   Leukocytes, UA NEGATIVE NEGATIVE  Urine culture     Status: None   Collection Time: 06/19/15  1:46 PM  Result Value Ref Range   Specimen Description URINE, CLEAN CATCH    Special Requests NONE    Culture MULTIPLE SPECIES PRESENT, SUGGEST RECOLLECTION    Report Status 06/21/2015 FINAL   Urine microscopic-add on     Status: Abnormal   Collection Time: 06/19/15  1:46 PM  Result Value Ref Range   Squamous Epithelial / LPF 0-5 (A) NONE SEEN   WBC, UA 0-5 0 - 5 WBC/hpf   RBC / HPF 0-5 0 - 5 RBC/hpf   Bacteria, UA RARE (A) NONE SEEN   Casts HYALINE CASTS (A) NEGATIVE   Urine-Other MUCOUS PRESENT   Lactic acid, plasma     Status: None   Collection Time: 06/19/15  2:26 PM  Result Value Ref Range   Lactic Acid, Venous 1.9 0.5 - 2.0 mmol/L  Glucose, capillary     Status: Abnormal   Collection Time: 06/19/15  4:39 PM  Result Value Ref Range   Glucose-Capillary 111 (H) 65 - 99 mg/dL  Glucose, capillary     Status: None   Collection Time: 06/19/15  9:17 PM  Result Value Ref Range   Glucose-Capillary 91 65 - 99 mg/dL  CBC     Status: Abnormal   Collection Time: 06/20/15  3:53 AM  Result Value Ref Range   WBC 14.2 (H) 4.0 - 10.5 K/uL   RBC 3.94 (L) 4.22 - 5.81 MIL/uL   Hemoglobin 12.2 (L) 13.0 - 17.0 g/dL   HCT 37.8 (L) 39.0 - 52.0 %   MCV 95.9 78.0 - 100.0 fL   MCH 31.0 26.0 - 34.0 pg   MCHC 32.3 30.0 - 36.0 g/dL   RDW 15.1 11.5 - 15.5 %   Platelets 288 150 - 400 K/uL  Basic metabolic panel     Status: Abnormal   Collection Time: 06/20/15  3:53 AM  Result Value Ref Range   Sodium 132 (L) 135 - 145 mmol/L   Potassium 3.3 (L) 3.5 - 5.1 mmol/L   Chloride 99 (L) 101 - 111 mmol/L   CO2 21 (L) 22 - 32 mmol/L   Glucose, Bld 125 (H) 65 - 99 mg/dL   BUN 12 6 - 20 mg/dL   Creatinine, Ser 1.31 (H) 0.61 - 1.24 mg/dL   Calcium 8.6 (L) 8.9 - 10.3 mg/dL   GFR calc non Af Amer 48 (L) >60 mL/min   GFR calc Af Amer 56 (L)  >60 mL/min    Comment: (NOTE) The eGFR has been calculated using the CKD EPI equation. This calculation has not been validated in all clinical situations. eGFR's persistently <60 mL/min signify possible Chronic Kidney Disease.    Anion gap 12 5 -  15  Glucose, capillary     Status: Abnormal   Collection Time: 06/20/15  7:22 AM  Result Value Ref Range   Glucose-Capillary 115 (H) 65 - 99 mg/dL   Comment 1 Notify RN   Troponin I (q 6hr x 3)     Status: None   Collection Time: 06/20/15 11:15 AM  Result Value Ref Range   Troponin I 0.03 <0.031 ng/mL    Comment:        NO INDICATION OF MYOCARDIAL INJURY.   Glucose, capillary     Status: Abnormal   Collection Time: 06/20/15 11:54 AM  Result Value Ref Range   Glucose-Capillary 128 (H) 65 - 99 mg/dL  Troponin I (q 6hr x 3)     Status: None   Collection Time: 06/20/15  4:07 PM  Result Value Ref Range   Troponin I 0.03 <0.031 ng/mL    Comment:        NO INDICATION OF MYOCARDIAL INJURY.   Glucose, capillary     Status: Abnormal   Collection Time: 06/20/15  4:51 PM  Result Value Ref Range   Glucose-Capillary 119 (H) 65 - 99 mg/dL  Glucose, capillary     Status: Abnormal   Collection Time: 06/20/15  9:16 PM  Result Value Ref Range   Glucose-Capillary 124 (H) 65 - 99 mg/dL  Troponin I (q 6hr x 3)     Status: None   Collection Time: 06/20/15 10:20 PM  Result Value Ref Range   Troponin I <0.03 <0.031 ng/mL    Comment:        NO INDICATION OF MYOCARDIAL INJURY.   CBC     Status: Abnormal   Collection Time: 06/21/15  4:26 AM  Result Value Ref Range   WBC 9.7 4.0 - 10.5 K/uL   RBC 3.21 (L) 4.22 - 5.81 MIL/uL   Hemoglobin 10.4 (L) 13.0 - 17.0 g/dL   HCT 30.6 (L) 39.0 - 52.0 %   MCV 95.3 78.0 - 100.0 fL   MCH 32.4 26.0 - 34.0 pg   MCHC 34.0 30.0 - 36.0 g/dL   RDW 15.0 11.5 - 15.5 %   Platelets 238 150 - 400 K/uL  Basic metabolic panel     Status: Abnormal   Collection Time: 06/21/15  4:26 AM  Result Value Ref Range   Sodium  133 (L) 135 - 145 mmol/L   Potassium 3.9 3.5 - 5.1 mmol/L   Chloride 103 101 - 111 mmol/L   CO2 21 (L) 22 - 32 mmol/L   Glucose, Bld 109 (H) 65 - 99 mg/dL   BUN 13 6 - 20 mg/dL   Creatinine, Ser 1.14 0.61 - 1.24 mg/dL   Calcium 8.4 (L) 8.9 - 10.3 mg/dL   GFR calc non Af Amer 57 (L) >60 mL/min   GFR calc Af Amer >60 >60 mL/min    Comment: (NOTE) The eGFR has been calculated using the CKD EPI equation. This calculation has not been validated in all clinical situations. eGFR's persistently <60 mL/min signify possible Chronic Kidney Disease.    Anion gap 9 5 - 15  Glucose, capillary     Status: None   Collection Time: 06/21/15  7:19 AM  Result Value Ref Range   Glucose-Capillary 91 65 - 99 mg/dL   Comment 1 Notify RN   Glucose, capillary     Status: Abnormal   Collection Time: 06/21/15 11:22 AM  Result Value Ref Range   Glucose-Capillary 104 (H) 65 - 99 mg/dL  Comment 1 Notify RN    Ct Chest Wo Contrast  06/19/2015  CLINICAL DATA:  Tachycardia and low-grade fever EXAM: CT CHEST WITHOUT CONTRAST TECHNIQUE: Multidetector CT imaging of the chest was performed following the standard protocol without IV contrast. COMPARISON:  03/09/2011 FINDINGS: Lungs are well aerated bilaterally. Some mild fibrotic changes are seen. No focal confluent infiltrate or sizable effusion is seen. No parenchymal nodules are noted. The thoracic inlet is within normal limits. The thoracic aorta and its branches demonstrate calcific changes without aneurysmal dilatation. The coronary arteries demonstrate heavy calcifications. No hilar or mediastinal adenopathy is noted. The visualized upper abdomen reveals no acute abnormality. The osseous structures show degenerative change of the thoracic spine. IMPRESSION: Chronic fibrotic changes without acute abnormality. Electronically Signed   By: Inez Catalina M.D.   On: 06/19/2015 17:43   US Renal  06/20/2015  CLINICAL DATA:  Acute renal failure. EXAM: RENAL / URINARY TRACT  ULTRASOUND COMPLETE COMPARISON:  CT, 04/29/2008 FINDINGS: Right Kidney: Length: 9.2 cm. Diffuse cortical thinning. Normal parenchymal echogenicity. Hypoechoic mass arises from the lower pole measuring 18 x 13 x 15 mm. This is likely a cyst but is not well enough defined to confidently diagnose a simple cyst. No other renal masses, no stones and no hydronephrosis. Left Kidney: Length: 11.1 cm. Diffuse cortical thinning. Normal parenchymal echogenicity. No masses or cysts. No stones or hydronephrosis. Bladder: Appears normal for degree of bladder distention. IMPRESSION: 1. Bilateral renal cortical thinning. 2. No hydronephrosis. 3. 18 mm lower pole right renal cyst, not fully characterized on this study. Recommend follow-up ultrasound in 4- 6 months to reassess. Electronically Signed   By: Lajean Manes M.D.   On: 06/20/2015 15:14       Medical Problem List and Plan: 1.  Right side weakness, hemisensory loss with dysphagia secondary to left thalamic infarct 2.  DVT Prophylaxis/Anticoagulation: Subcutaneous Lovenox. Monitor platelet counts and any signs of bleeding 3. Pain Management: Ultram 50 mg every 6 hours as needed moderate pain due to arthritic knee and hip  -pace as needed 4. Dysphagia. Dysphagia #2 thin liquid diet. Follow-up speech therapy. 5. Neuropsych: This patient is capable of making decisions on his own behalf. 6. Skin/Wound Care: Routine skin checks 7. Fluids/Electrolytes/Nutrition: Routine I&O with follow-up chemistries 8. CAD with stenting/history of PVCs. Low dose Lopressor resumed 12.5 mg twice a day. Follow-up cardiology services. Monitor with increased mobility 9. Diabetes mellitus with peripheral neuropathy. Hemoglobin A1c 5.6. Actos 15 mg daily. Check blood sugars before meals and at bedtime 10. Hyperlipidemia. Lipitor   Post Admission Physician Evaluation: 1. Functional deficits secondary  to left thalamic infarct. 2. Patient is admitted to receive collaborative,  interdisciplinary care between the physiatrist, rehab nursing staff, and therapy team. 3. Patient's level of medical complexity and substantial therapy needs in context of that medical necessity cannot be provided at a lesser intensity of care such as a SNF. 4. Patient has experienced substantial functional loss from his/her baseline which was documented above under the "Functional History" and "Functional Status" headings.  Judging by the patient's diagnosis, physical exam, and functional history, the patient has potential for functional progress which will result in measurable gains while on inpatient rehab.  These gains will be of substantial and practical use upon discharge  in facilitating mobility and self-care at the household level. 5. Physiatrist will provide 24 hour management of medical needs as well as oversight of the therapy plan/treatment and provide guidance as appropriate regarding the interaction of the two.  6. 24 hour rehab nursing will assist with bladder management, bowel management, safety, skin/wound care, disease management, medication administration, pain management and patient education  and help integrate therapy concepts, techniques,education, etc. 7. PT will assess and treat for/with: Lower extremity strength, range of motion, stamina, balance, functional mobility, safety, adaptive techniques and equipment, NMR, cognitive perceptual awareness, pain mgt, education.   Goals are: supervision to min assist. 8. OT will assess and treat for/with: ADL's, functional mobility, safety, upper extremity strength, adaptive techniques and equipment, NMR, cognitive perceptual awareness, ego support, family ed.   Goals are: supervision to min assist. Therapy may proceed with showering this patient. 9. SLP will assess and treat for/with: cognition, swallowing, education.  Goals are: mod I to supervision. 10. Case Management and Social Worker will assess and treat for psychological issues and  discharge planning. 11. Team conference will be held weekly to assess progress toward goals and to determine barriers to discharge. 12. Patient will receive at least 3 hours of therapy per day at least 5 days per week. 13. ELOS: 16-20 days       14. Prognosis:  good     Meredith Staggers, MD, [facility] 06/21/2015   06/21/2015

## 2015-06-21 NOTE — Progress Notes (Signed)
    Subjective:  Feeling better. No CP, dyspnea, palpitations.   Objective:  Vital Signs in the last 24 hours: Temp:  [97.9 F (36.6 C)-98.2 F (36.8 C)] 98.2 F (36.8 C) (04/18 1500) Pulse Rate:  [69-87] 69 (04/18 1455) Resp:  [13-29] 19 (04/18 1455) BP: (108-130)/(54-90) 111/62 mmHg (04/18 1455) SpO2:  [96 %-100 %] 99 % (04/18 1455) Weight:  [194 lb 10.7 oz (88.3 kg)] 194 lb 10.7 oz (88.3 kg) (04/18 0500)  Intake/Output from previous day: 04/17 0701 - 04/18 0700 In: 1430 [P.O.:1280; IV Piggyback:150] Out: 0   Physical Exam: Pt is alert and oriented, NAD HEENT: normal Neck: JVP - normal Lungs: CTA bilaterally CV: RRR without murmur or gallop Abd: soft, NT Ext: no C/C/E Skin: warm/dry no rash   Lab Results:  Recent Labs  06/20/15 0353 06/21/15 0426  WBC 14.2* 9.7  HGB 12.2* 10.4*  PLT 288 238    Recent Labs  06/20/15 0353 06/21/15 0426  NA 132* 133*  K 3.3* 3.9  CL 99* 103  CO2 21* 21*  GLUCOSE 125* 109*  BUN 12 13  CREATININE 1.31* 1.14    Recent Labs  06/20/15 1607 06/20/15 2220  TROPONINI 0.03 <0.03   Tele: Sinus rhythm, no arrhythmia  Assessment/Plan:  1. SVT - no recurrence. Continue metoprolol at current dose.   2. CAD, native vessel - no angina or ischemic symptoms  3. Acute CVA - per primary team. Now on ASA and plavix  Pt stable from cardiac perspective. He will be discharged to [facility]. Please call if any questions arise. Gaynell Face, M.D. 06/21/2015, 5:08 PM

## 2015-06-21 NOTE — H&P (Signed)
Physical Medicine and Rehabilitation Admission H&P    Chief Complaint  Patient presents with  . Loss of Consciousness  : HPI: Herbert Marquez is a 82 y.o. right handed male with history of hypertension,Tachycardia with PVCs, type 2 diabetes mellitus, coronary artery disease with stenting. Patient lives with spouse, who is only able to provide supervision at discharge. Independent with a cane prior to admission due to knee and hip pain. One level home with 2 steps to entry. Presented 06/16/2015 with syncope, dizziness. No seizure activity noted. Initially in the ED workup unremarkable. Patient subsequently developed right sided weakness and facial droop in the ED with a blank stare and was unresponsive for at least 1 minute. Cranial CT scan negative. Patient did not receive TPA. Troponin negative. MRI of the brain showed acute 8 x 12 mm left thalamic infarct. Chronic small vessel disease. 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 High-grade stenosis right P2 segment. MRA of neck with no high-grade stenosis. CTA of neck probable moderate to high-grade stenosis left internal carotid artery origin, limited by motion. Less than 50% stenosis right internal carotid artery. CTA of head occluded right V4 segment. Occluded left posterior cerebral artery at origin. Occluded proximal right P3 segment. EEG consistent with mild generalized nonspecific cerebral dysfunction/encephalopathy. No seizure activity. Echocardiogram ejection fraction 60% no wall motion abnormalities. Grade 1 diastolic dysfunction. Neurology consulted maintain on aspirin and Plavix for CVA prophylaxis. Subcutaneous Lovenox for DVT prophylaxis. Dysphagia #2 thin liquid diet. On 06/19/2015 patient with low-grade fever and some nonspecific tachycardiaIn the 120s. 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 Cardiology services follow-up low-dose beta blocker with metoprolol added and monitor  closely for hypotension. Sepsis protocol initiated. Lactic acid 1.9. CT of the chest without acute abnormality. White blood cell count 14,200 maintain on broad-spectrum antibiotics. Mild hypokalemia 3.3. Creatinine 1.14. Urine culture negative nitrite. Renal ultrasound no hydronephrosis. 18 mm lower pole right renal cyst recommended follow-up ultrasound 4-6 months to reassess. PhysicalAnd occupational therapy evaluations completed with recommendations of physical medicine rehabilitation consult. Patient was admitted for a comprehensive rehabilitation program  ROS Constitutional: Negative for chills.  HENT: Negative for hearing loss.  Eyes: Negative for blurred vision and double vision.  Respiratory: Negative for cough and shortness of breath.  Cardiovascular: Positive for palpitations. Negative for chest pain and leg swelling.  Gastrointestinal: Positive for constipation. Negative for nausea and vomiting.   GERD  Genitourinary: Positive for urgency. Negative for dysuria and hematuria.  Musculoskeletal: Positive for myalgias and joint pain.  Skin: Negative for rash.  Neurological: Positive for dizziness and weakness. Negative for tremors and headaches.  All other systems reviewed and are negative   Past Medical History  Diagnosis Date  . Coronary artery disease     a. s/p PCI of RCA in 2006  . Hyperlipidemia   . Hypertension   . BPH (benign prostatic hypertrophy)   . TIA (transient ischemic attack)     Approximately 6 weeks post-cardiac catheterization.   . Type II diabetes mellitus ([place])     "prediabetic; lost alot of weight; not diabetic now" (06/16/2015)  . GERD (gastroesophageal reflux disease)   . Arthritis     "pretty much all over"   . Basal cell carcinoma     "several burned off his body, face, head"  . CVA (cerebral infarction)     a. 06/2015: left thalamic and bilateral PCA   Past Surgical History  Procedure Laterality Date  . Coronary angioplasty with stent  placement  10/2004    stenting x 2 to RCA  . Cataract extraction, bilateral    . Cardiovascular stress test  07/01/2007    EF 74%  . Hip arthroplasty  03/09/2011    Procedure: ARTHROPLASTY BIPOLAR HIP;  Surgeon: Mauri Pole;  Location: [facility];  Service: Orthopedics;  Laterality: Left;  . Hernia repair    . Laparoscopic incisional / umbilical / ventral hernia repair      "below his naval"   Family History  Problem Relation Age of Onset  . Lung cancer Father   . Lung cancer Brother   . Hypertension Mother    Social History:  reports that he quit smoking about 44 years ago. He has never used smokeless tobacco. He reports that he drinks about 7.2 oz of alcohol per week. He reports that he does not use illicit drugs. Allergies: No Known Allergies Medications Prior to Admission  Medication Sig Dispense Refill  . aspirin 81 MG tablet Take 81 mg by mouth daily.     [name] atorvastatin (LIPITOR) 40 MG tablet TAKE ONE TABLET BY MOUTH ONCE DAILY 90 tablet 2  . diphenhydrAMINE (BENADRYL) 25 MG tablet Take 25 mg by mouth every 6 (six) hours as needed. Rhinorrhea    . metoprolol succinate (TOPROL-XL) 100 MG 24 hr tablet TAKE ONE TABLET BY MOUTH ONCE DAILY WITH MEALS 90 tablet 3  . Multiple Vitamin (MULTIVITAMIN) tablet Take 1 tablet by mouth daily.     . nitroGLYCERIN (NITROSTAT) 0.4 MG SL tablet DISSOLVE ONE TABLET UNDER THE TONGUE EVERY 5 MINUTES AS NEEDED FOR CHEST PAIN.  DO NOT EXCEED A TOTAL OF 3 DOSES IN 15 MINUTES 25 tablet 3  . omeprazole (PRILOSEC) 20 MG capsule Take 20 mg by mouth daily.     . pioglitazone (ACTOS) 30 MG tablet Take 15 mg by mouth daily.     . Tamsulosin HCl (FLOMAX) 0.4 MG CAPS Take by mouth daily after supper.    . triamterene-hydrochlorothiazide (MAXZIDE-25) 37.5-25 MG tablet Take 1 tablet by mouth daily.      Home: Home Living Family/patient expects to be discharged to:: Private residence Living Arrangements: Spouse/significant other Available Help at Discharge:  Family, Available 24 hours/day Type of Home: House Home Access: Stairs to enter [employer] of Steps: 2 Entrance Stairs-Rails: Right Home Layout: One level Bathroom Shower/Tub: Multimedia programmer: Handicapped height Bathroom Accessibility: Yes Home Equipment: Environmental consultant - 2 wheels, [place] - single point (Does not have BSC or shower seat)   Functional History: Prior Function Level of Independence: Independent with assistive device(s) Comments: Uses cane for gait.  Functional Status:  Mobility: Bed Mobility Overal bed mobility: Needs Assistance Bed Mobility: Rolling, Sit to Supine Rolling: Min guard Sidelying to sit: Mod assist Sit to supine: Min assist Sit to sidelying: Mod assist, +2 for physical assistance General bed mobility comments: Pt seated in the recliner chair upon entering the room.  Transfers Overall transfer level: Needs assistance Equipment used: Rolling walker (2 wheeled), None Transfers: Sit to/from Stand, Stand Pivot Transfers Sit to Stand: +2 physical assistance, Mod assist Stand pivot transfers: +2 physical assistance, Max assist General transfer comment: Two person mod assist to stand from recliner chair with RW.  Right knee blocked and right hand manually assisted to stay on recliner armrest while pushing up and to help find the RW once standing.  Verbal cues for safe hand placement.  Pt sat back down in  preparation for the transfer as the walker was serving as an obstacle and therapist felt safer closer to the pt.  Stand pivot transfer x 2 to the pt's left side giiving him the opportunity to reach with his left hand for the bed rail and then subsequently the armrest on the recliner chair.  Therapist blocked the pt's right knee while he took 1-2 pivotal steps to the bed and then the recliner with max assist to support trunk.  Ambulation/Gait General Gait Details: Unable    ADL: ADL Overall ADL's : Needs assistance/impaired Eating/Feeding:  Minimal assistance (able to feed self with L nondominant hand) Grooming: Minimal assistance Grooming Details (indicate cue type and reason): Pt shaved self using L hand Upper Body Bathing: Minimal assitance, Sitting Lower Body Bathing: Moderate assistance, Sit to/from stand Upper Body Dressing : Moderate assistance, Sitting Lower Body Dressing: Maximal assistance, Sit to/from stand Toilet Transfer: +2 for physical assistance, Maximal assistance, Stand-pivot Toileting- Clothing Manipulation and Hygiene: Maximal assistance Functional mobility during ADLs: +2 for physical assistance, Maximal assistance General ADL Comments: Educated wife/pt o nalways being aware of location of RUE due to sensory loss to prevent injury; demonstrated use of hand over hand technique to feed self with R hand. Using this technique, pt able to drink from cup using straw and feed self applesauce; for mobility, recommend staff use "stedy"  Cognition: Cognition Overall Cognitive Status: Impaired/Different from baseline Orientation Level: Oriented X4 Cognition Arousal/Alertness: Awake/alert Behavior During Therapy: WFL for tasks assessed/performed Overall Cognitive Status: Impaired/Different from baseline Area of Impairment: Attention, Safety/judgement, Problem solving Current Attention Level: Selective Memory:  (will further assess) Safety/Judgement: Decreased awareness of safety Problem Solving: Slow processing General Comments: Pt able to follow commands for pre gait and transfer, does not attend well to right arm or leg unless he is visually looking at them.    Physical Exam: Blood pressure 125/71, pulse 72, temperature 97.9 F (36.6 C), temperature source Oral, resp. rate 20, height '5\' 7"'$  (1.702 m), weight 88.3 kg (194 lb 10.7 oz), SpO2 98 %. Physical Exam Constitutional: He is oriented to person, place, and time. He appears well-developed and well-nourished.  HENT: oral mucosa pink and moist Head:  Normocephalic and atraumatic.  Eyes: Conjunctivae and EOM are normal.  Neck: Normal range of motion. Neck supple. No thyromegaly present.  Cardiovascular: Regular rhythm and rate. No murmurs or rubs Respiratory: Effort normal and breath sounds normal. No respiratory distress. No wheezes rales or rhonchi. GI: Soft. Bowel sounds are normal. He exhibits no distension. Non tender Musculoskeletal: Mild effusion right knee. Chronic changes in patella and along the medial/lateral joint.  Neurological: He is alert and oriented to person, place, and time.  Appears to display reasonable insight and awareness. Somewhat distractible and impulsive at times.  Follows simple commands. Sensation diminished to LT in both the right arm and leg. ?minimal/mild sensory loss distal left leg. Right leg/arm ataxic with general and attempt at more fine motor movements. Impulsive with movement of right side too at times. Motor: LUE: 4+/5 proximal to distal RUE: 4-/5 delt, bicep, tricep, wrist/hand.  LLE: hip flexion 3+/5, knee extension 4/5, ankle dorsi/plantar flexion 4/5 RLE: hip flexion 2+/5, knee extension 3-/5, ankle dorsi/plantarflexion 4-/5  DTR's grossly 1+ throughout.  Skin: Skin is warm and dry. Numerous bruises over the right arm, some on left as well.   Psychiatric: He has a normal mood and affect. His behavior is normal. Very cooperative.    Results for orders placed or performed during the  hospital encounter of 06/16/15 (from the past 48 hour(s))  Urinalysis, Routine w reflex microscopic (not at [facility])     Status: Abnormal   Collection Time: 06/19/15  1:46 PM  Result Value Ref Range   Color, Urine AMBER (A) YELLOW    Comment: BIOCHEMICALS MAY BE AFFECTED BY COLOR   APPearance CLEAR CLEAR   Specific Gravity, Urine 1.027 1.005 - 1.030   pH 5.5 5.0 - 8.0   Glucose, UA NEGATIVE NEGATIVE mg/dL   Hgb urine dipstick NEGATIVE NEGATIVE   Bilirubin Urine SMALL (A) NEGATIVE   Ketones, ur 15 (A) NEGATIVE  mg/dL   Protein, ur 100 (A) NEGATIVE mg/dL   Nitrite NEGATIVE NEGATIVE   Leukocytes, UA NEGATIVE NEGATIVE  Urine culture     Status: None   Collection Time: 06/19/15  1:46 PM  Result Value Ref Range   Specimen Description URINE, CLEAN CATCH    Special Requests NONE    Culture MULTIPLE SPECIES PRESENT, SUGGEST RECOLLECTION    Report Status 06/21/2015 FINAL   Urine microscopic-add on     Status: Abnormal   Collection Time: 06/19/15  1:46 PM  Result Value Ref Range   Squamous Epithelial / LPF 0-5 (A) NONE SEEN   WBC, UA 0-5 0 - 5 WBC/hpf   RBC / HPF 0-5 0 - 5 RBC/hpf   Bacteria, UA RARE (A) NONE SEEN   Casts HYALINE CASTS (A) NEGATIVE   Urine-Other MUCOUS PRESENT   Lactic acid, plasma     Status: None   Collection Time: 06/19/15  2:26 PM  Result Value Ref Range   Lactic Acid, Venous 1.9 0.5 - 2.0 mmol/L  Glucose, capillary     Status: Abnormal   Collection Time: 06/19/15  4:39 PM  Result Value Ref Range   Glucose-Capillary 111 (H) 65 - 99 mg/dL  Glucose, capillary     Status: None   Collection Time: 06/19/15  9:17 PM  Result Value Ref Range   Glucose-Capillary 91 65 - 99 mg/dL  CBC     Status: Abnormal   Collection Time: 06/20/15  3:53 AM  Result Value Ref Range   WBC 14.2 (H) 4.0 - 10.5 K/uL   RBC 3.94 (L) 4.22 - 5.81 MIL/uL   Hemoglobin 12.2 (L) 13.0 - 17.0 g/dL   HCT 37.8 (L) 39.0 - 52.0 %   MCV 95.9 78.0 - 100.0 fL   MCH 31.0 26.0 - 34.0 pg   MCHC 32.3 30.0 - 36.0 g/dL   RDW 15.1 11.5 - 15.5 %   Platelets 288 150 - 400 K/uL  Basic metabolic panel     Status: Abnormal   Collection Time: 06/20/15  3:53 AM  Result Value Ref Range   Sodium 132 (L) 135 - 145 mmol/L   Potassium 3.3 (L) 3.5 - 5.1 mmol/L   Chloride 99 (L) 101 - 111 mmol/L   CO2 21 (L) 22 - 32 mmol/L   Glucose, Bld 125 (H) 65 - 99 mg/dL   BUN 12 6 - 20 mg/dL   Creatinine, Ser 1.31 (H) 0.61 - 1.24 mg/dL   Calcium 8.6 (L) 8.9 - 10.3 mg/dL   GFR calc non Af Amer 48 (L) >60 mL/min   GFR calc Af Amer 56 (L)  >60 mL/min    Comment: (NOTE) The eGFR has been calculated using the CKD EPI equation. This calculation has not been validated in all clinical situations. eGFR's persistently <60 mL/min signify possible Chronic Kidney Disease.    Anion gap 12 5 -  15  Glucose, capillary     Status: Abnormal   Collection Time: 06/20/15  7:22 AM  Result Value Ref Range   Glucose-Capillary 115 (H) 65 - 99 mg/dL   Comment 1 Notify RN   Troponin I (q 6hr x 3)     Status: None   Collection Time: 06/20/15 11:15 AM  Result Value Ref Range   Troponin I 0.03 <0.031 ng/mL    Comment:        NO INDICATION OF MYOCARDIAL INJURY.   Glucose, capillary     Status: Abnormal   Collection Time: 06/20/15 11:54 AM  Result Value Ref Range   Glucose-Capillary 128 (H) 65 - 99 mg/dL  Troponin I (q 6hr x 3)     Status: None   Collection Time: 06/20/15  4:07 PM  Result Value Ref Range   Troponin I 0.03 <0.031 ng/mL    Comment:        NO INDICATION OF MYOCARDIAL INJURY.   Glucose, capillary     Status: Abnormal   Collection Time: 06/20/15  4:51 PM  Result Value Ref Range   Glucose-Capillary 119 (H) 65 - 99 mg/dL  Glucose, capillary     Status: Abnormal   Collection Time: 06/20/15  9:16 PM  Result Value Ref Range   Glucose-Capillary 124 (H) 65 - 99 mg/dL  Troponin I (q 6hr x 3)     Status: None   Collection Time: 06/20/15 10:20 PM  Result Value Ref Range   Troponin I <0.03 <0.031 ng/mL    Comment:        NO INDICATION OF MYOCARDIAL INJURY.   CBC     Status: Abnormal   Collection Time: 06/21/15  4:26 AM  Result Value Ref Range   WBC 9.7 4.0 - 10.5 K/uL   RBC 3.21 (L) 4.22 - 5.81 MIL/uL   Hemoglobin 10.4 (L) 13.0 - 17.0 g/dL   HCT 30.6 (L) 39.0 - 52.0 %   MCV 95.3 78.0 - 100.0 fL   MCH 32.4 26.0 - 34.0 pg   MCHC 34.0 30.0 - 36.0 g/dL   RDW 15.0 11.5 - 15.5 %   Platelets 238 150 - 400 K/uL  Basic metabolic panel     Status: Abnormal   Collection Time: 06/21/15  4:26 AM  Result Value Ref Range   Sodium  133 (L) 135 - 145 mmol/L   Potassium 3.9 3.5 - 5.1 mmol/L   Chloride 103 101 - 111 mmol/L   CO2 21 (L) 22 - 32 mmol/L   Glucose, Bld 109 (H) 65 - 99 mg/dL   BUN 13 6 - 20 mg/dL   Creatinine, Ser 1.14 0.61 - 1.24 mg/dL   Calcium 8.4 (L) 8.9 - 10.3 mg/dL   GFR calc non Af Amer 57 (L) >60 mL/min   GFR calc Af Amer >60 >60 mL/min    Comment: (NOTE) The eGFR has been calculated using the CKD EPI equation. This calculation has not been validated in all clinical situations. eGFR's persistently <60 mL/min signify possible Chronic Kidney Disease.    Anion gap 9 5 - 15  Glucose, capillary     Status: None   Collection Time: 06/21/15  7:19 AM  Result Value Ref Range   Glucose-Capillary 91 65 - 99 mg/dL   Comment 1 Notify RN   Glucose, capillary     Status: Abnormal   Collection Time: 06/21/15 11:22 AM  Result Value Ref Range   Glucose-Capillary 104 (H) 65 - 99 mg/dL  Comment 1 Notify RN    Ct Chest Wo Contrast  06/19/2015  CLINICAL DATA:  Tachycardia and low-grade fever EXAM: CT CHEST WITHOUT CONTRAST TECHNIQUE: Multidetector CT imaging of the chest was performed following the standard protocol without IV contrast. COMPARISON:  03/09/2011 FINDINGS: Lungs are well aerated bilaterally. Some mild fibrotic changes are seen. No focal confluent infiltrate or sizable effusion is seen. No parenchymal nodules are noted. The thoracic inlet is within normal limits. The thoracic aorta and its branches demonstrate calcific changes without aneurysmal dilatation. The coronary arteries demonstrate heavy calcifications. No hilar or mediastinal adenopathy is noted. The visualized upper abdomen reveals no acute abnormality. The osseous structures show degenerative change of the thoracic spine. IMPRESSION: Chronic fibrotic changes without acute abnormality. Electronically Signed   By: Inez Catalina M.D.   On: 06/19/2015 17:43   US Renal  06/20/2015  CLINICAL DATA:  Acute renal failure. EXAM: RENAL / URINARY TRACT  ULTRASOUND COMPLETE COMPARISON:  CT, 04/29/2008 FINDINGS: Right Kidney: Length: 9.2 cm. Diffuse cortical thinning. Normal parenchymal echogenicity. Hypoechoic mass arises from the lower pole measuring 18 x 13 x 15 mm. This is likely a cyst but is not well enough defined to confidently diagnose a simple cyst. No other renal masses, no stones and no hydronephrosis. Left Kidney: Length: 11.1 cm. Diffuse cortical thinning. Normal parenchymal echogenicity. No masses or cysts. No stones or hydronephrosis. Bladder: Appears normal for degree of bladder distention. IMPRESSION: 1. Bilateral renal cortical thinning. 2. No hydronephrosis. 3. 18 mm lower pole right renal cyst, not fully characterized on this study. Recommend follow-up ultrasound in 4- 6 months to reassess. Electronically Signed   By: Lajean Manes M.D.   On: 06/20/2015 15:14       Medical Problem List and Plan: 1.  Right side weakness, hemisensory loss with dysphagia secondary to left thalamic infarct 2.  DVT Prophylaxis/Anticoagulation: Subcutaneous Lovenox. Monitor platelet counts and any signs of bleeding 3. Pain Management: Ultram 50 mg every 6 hours as needed moderate pain due to arthritic knee and hip  -pace as needed 4. Dysphagia. Dysphagia #2 thin liquid diet. Follow-up speech therapy. 5. Neuropsych: This patient is capable of making decisions on his own behalf. 6. Skin/Wound Care: Routine skin checks 7. Fluids/Electrolytes/Nutrition: Routine I&O with follow-up chemistries 8. CAD with stenting/history of PVCs. Low dose Lopressor resumed 12.5 mg twice a day. Follow-up cardiology services. Monitor with increased mobility 9. Diabetes mellitus with peripheral neuropathy. Hemoglobin A1c 5.6. Actos 15 mg daily. Check blood sugars before meals and at bedtime 10. Hyperlipidemia. Lipitor   Post Admission Physician Evaluation: 1. Functional deficits secondary  to left thalamic infarct. 2. Patient is admitted to receive collaborative,  interdisciplinary care between the physiatrist, rehab nursing staff, and therapy team. 3. Patient's level of medical complexity and substantial therapy needs in context of that medical necessity cannot be provided at a lesser intensity of care such as a SNF. 4. Patient has experienced substantial functional loss from his/her baseline which was documented above under the "Functional History" and "Functional Status" headings.  Judging by the patient's diagnosis, physical exam, and functional history, the patient has potential for functional progress which will result in measurable gains while on inpatient rehab.  These gains will be of substantial and practical use upon discharge  in facilitating mobility and self-care at the household level. 5. Physiatrist will provide 24 hour management of medical needs as well as oversight of the therapy plan/treatment and provide guidance as appropriate regarding the interaction of the two.  6. 24 hour rehab nursing will assist with bladder management, bowel management, safety, skin/wound care, disease management, medication administration, pain management and patient education  and help integrate therapy concepts, techniques,education, etc. 7. PT will assess and treat for/with: Lower extremity strength, range of motion, stamina, balance, functional mobility, safety, adaptive techniques and equipment, NMR, cognitive perceptual awareness, pain mgt, education.   Goals are: supervision to min assist. 8. OT will assess and treat for/with: ADL's, functional mobility, safety, upper extremity strength, adaptive techniques and equipment, NMR, cognitive perceptual awareness, ego support, family ed.   Goals are: supervision to min assist. Therapy may proceed with showering this patient. 9. SLP will assess and treat for/with: cognition, swallowing, education.  Goals are: mod I to supervision. 10. Case Management and Social Worker will assess and treat for psychological issues and  discharge planning. 11. Team conference will be held weekly to assess progress toward goals and to determine barriers to discharge. 12. Patient will receive at least 3 hours of therapy per day at least 5 days per week. 13. ELOS: 16-20 days       14. Prognosis:  good     Meredith Staggers, MD, [facility] 06/21/2015   06/21/2015

## 2015-06-21 NOTE — Discharge Summary (Signed)
Physician Discharge Summary  Herbert Marquez [id number] DOB: 1927-03-18 DOA: 06/16/2015  PCP: Thressa Sheller, MD  Admit date: 06/16/2015 Discharge date: 06/21/2015  Recommendations for Outpatient Follow-up:  1. Pt will need to follow up with PCP in 1-2 weeks post discharge 2. Please obtain BMP to evaluate electrolytes and kidney function 3. Please also check CBC to evaluate Hg and Hct levels 4. Pt discharge on Aspirin and Plavix to complete therapy for 3 months and after that continue with Plavix only  5. Pt was previously on aspirin 81 mg PO QD but the dose was increased to 325 mg PO QD per neurology team recommendations  6. Pt also on metoprolol but the dose of decreased to 12.5 mg PO BID due to lower BP, please note that dose of Metoprolol can be increased if needed for HR control if BP tolerates 7. Please also note that Maxzide was temporarily held due to low blood pressure and can be resumed in near future if still indicated  8. Neurologist recommended 30 day loop monitor in ana outpatient setting, will defer this to cardiology to schedule   Discharge Diagnoses:  Active Problems:   Syncope   Cerebral thrombosis with cerebral infarction   Acute CVA (cerebrovascular accident) ([place])   Stroke (cerebrum) (HCC)   HLD (hyperlipidemia)   Benign essential HTN   Type 2 diabetes mellitus with complication, without long-term current use of insulin (HCC)   Coronary artery disease involving native coronary artery of native heart without angina pectoris   Dysphagia as late effect of cerebrovascular disease   Low grade fever   Leukocytosis   Hypokalemia   Hyponatremia   AKI (acute kidney injury) ([place])   Sinus tachycardia (HCC)   PSVT (paroxysmal supraventricular tachycardia) ([place])   Discharge Condition: Stable  Diet recommendation: Dys II diet   Brief Narrative:  82 y.o. male with history of hypertension, hyperlipidemia, diabetes, TIA and myocardial infarction presented for  evaluation of syncope and transient right-sided weakness with mental status change. He did not receive IV t-PA due to deficits resolved.   Major events since admission: 4/16 - more tachy and with low grade fevers, seems warmer than the actual temp, sepsis protocol initiated  4/17 - overall better but still with intermittent tachy and varying BP, as low as SBP 80's and up to 130's 4/18 - more alert this AM, reports no concerns, transfer to inpatient rehab   Assessment & Plan:  SIRS - not present on admission but noted to be warm on PE 4/16 with HR up to 120 - no clear infectious source noted, UA clear and CT chest with no indication of PNA - unclear why pt still with low grade fever up to 100.3 F and WBC up from 10 --> 12.6 --> 14 K -- > WNL this AM  - sepsis protocol initiated 4/16, started vancomycin and zosyn and since no clear evidence of an infectious etiology, will stop all ABX at this time  - lactic acid WNL and procalcitonin < 0.1, recommendation is to stop all ABX  Tachycardic with HR up to 140's - started on low dose Metoprolol 12.5 mg PO BID  - dose can be readjusted as needed based on BP control   Dominant left thalamic infarct thought to be secondary to small vessel disease source - improved function in the RUE, pt more clear this AM and calm - MRI L thalamic infarct  - MRA head Occluded L PCA, high grade R P2 stenosis - MRA neck no high  grade stenosis - CTA head occluded R V4 and L PCA at origine, R P3  - CTA neck <50% R ICA stenosis, Mod to high grade stenosis L ICA - 2D Echo with normal EF  - EEG with no evidence of seizures  - continue aspirin and plavix for three months and than Plavix alone per neurology team recommendations   Hyponatremia - mild, pre renal, stable overall   Hypertension, essential  - OK to continue low dose Metoprolol - Maxzide held until BP stabilizes   Hypokalemia - WNL this AM   Hyperlipidemia - Continue statin at  discharge  Diabetes type II - A1C 5.6 - continue Actos  Acute kidney insufficiency  - renal US unremarkable, Cr is now WNL   Obesity - Body mass index is 29.69  DVT prophylaxis: Lovenox SQ Code Status: Full  Family Communication: Patient and wife at bedside  Disposition Plan: Inpatient rehab   Consultants:   Neurology   PT/OT/SLP  PM&R  Cardiology  Procedures:   None  Antimicrobials:   Vanc 4/16 --> 4/17  Zosyn 4/16 --> 4/18   Discharge Exam: Filed Vitals:   06/21/15 0700 06/21/15 0947  BP: 130/75 116/67  Pulse: 71 81  Temp: 98.1 F (36.7 C)   Resp: 20    Filed Vitals:   06/21/15 0300 06/21/15 0500 06/21/15 0700 06/21/15 0947  BP: 124/90  130/75 116/67  Pulse: 73  71 81  Temp:   98.1 F (36.7 C)   TempSrc:   Oral   Resp: 13  20   Height:      Weight:  88.3 kg (194 lb 10.7 oz)    SpO2: 100%  100%     General: Pt is alert, follows commands appropriately, not in acute distress Cardiovascular: Regular rate and rhythm, no rubs, no gallops Respiratory: Clear to auscultation bilaterally, no wheezing, diminished breath sounds at bases  Abdominal: Soft, non tender, non distended, bowel sounds +, no guarding Extremities: no cyanosis, pulses palpable bilaterally DP and PT  Discharge Instructions  Discharge Instructions    Ambulatory referral to Neurology    Complete by:  As directed   Dr. Erlinda Hong requests follow up for this patient in 2 months.     Diet - low sodium heart healthy    Complete by:  As directed      Increase activity slowly    Complete by:  As directed             Medication List    STOP taking these medications        diphenhydrAMINE 25 MG tablet  Commonly known as:  BENADRYL     MAXZIDE-25 37.5-25 MG tablet  Generic drug:  triamterene-hydrochlorothiazide     metoprolol succinate 100 MG 24 hr tablet  Commonly known as:  TOPROL-XL      TAKE these medications        aspirin 325 MG tablet  Take 1 tablet (325 mg total) by  mouth daily.     atorvastatin 80 MG tablet  Commonly known as:  LIPITOR  Take 1 tablet (80 mg total) by mouth daily.     clopidogrel 75 MG tablet  Commonly known as:  PLAVIX  Take 1 tablet (75 mg total) by mouth daily.     metoprolol tartrate 25 MG tablet  Commonly known as:  LOPRESSOR  Take 0.5 tablets (12.5 mg total) by mouth 2 (two) times daily.     multivitamin tablet  Take 1 tablet by mouth daily.  nitroGLYCERIN 0.4 MG SL tablet  Commonly known as:  NITROSTAT  DISSOLVE ONE TABLET UNDER THE TONGUE EVERY 5 MINUTES AS NEEDED FOR CHEST PAIN.  DO NOT EXCEED A TOTAL OF 3 DOSES IN 15 MINUTES     omeprazole 20 MG capsule  Commonly known as:  PRILOSEC  Take 20 mg by mouth daily.     pioglitazone 30 MG tablet  Commonly known as:  ACTOS  Take 15 mg by mouth daily.     tamsulosin 0.4 MG Caps capsule  Commonly known as:  FLOMAX  Take by mouth daily after supper.     traMADol 50 MG tablet  Commonly known as:  ULTRAM  Take 1 tablet (50 mg total) by mouth every 6 (six) hours as needed for moderate pain.           Follow-up Information    Follow up with Xu,Jindong, MD. Schedule an appointment as soon as possible for a visit in 2 months.   Specialty:  Neurology   Why:  Stroke follow-up.   Contact information:   [address] [place] [address] [phone]       Follow up with Thressa Sheller, MD.   Specialty:  Internal Medicine   Contact information:   [place], [place] [address] [phone]       Call Faye Ramsay, MD.   Specialty:  Internal Medicine   Why:  As needed   Contact information:   [address] [place] [address] [phone]        The results of significant diagnostics from this hospitalization (including imaging, microbiology, ancillary and laboratory) are listed below for reference.     Microbiology: Recent Results (from the past 240 hour(s))  MRSA PCR Screening      Status: None   Collection Time: 06/17/15  6:00 AM  Result Value Ref Range Status   MRSA by PCR NEGATIVE NEGATIVE Final    Comment:        The GeneXpert MRSA Assay (FDA approved for NASAL specimens only), is one component of a comprehensive MRSA colonization surveillance program. It is not intended to diagnose MRSA infection nor to guide or monitor treatment for MRSA infections.   Culture, blood (x 2)     Status: None (Preliminary result)   Collection Time: 06/19/15 11:20 AM  Result Value Ref Range Status   Specimen Description BLOOD LEFT ARM  Final   Special Requests BOTTLES DRAWN AEROBIC AND ANAEROBIC 10CC  Final   Culture NO GROWTH 1 DAY  Final   Report Status PENDING  Incomplete  Culture, blood (x 2)     Status: None (Preliminary result)   Collection Time: 06/19/15 11:25 AM  Result Value Ref Range Status   Specimen Description BLOOD RIGHT ARM  Final   Special Requests BOTTLES DRAWN AEROBIC AND ANAEROBIC 10CC  Final   Culture NO GROWTH 1 DAY  Final   Report Status PENDING  Incomplete  Urine culture     Status: None   Collection Time: 06/19/15  1:46 PM  Result Value Ref Range Status   Specimen Description URINE, CLEAN CATCH  Final   Special Requests NONE  Final   Culture MULTIPLE SPECIES PRESENT, SUGGEST RECOLLECTION  Final   Report Status 06/21/2015 FINAL  Final     Labs: Basic Metabolic Panel:  Recent Labs Lab 06/17/15 0433 06/18/15 0609 06/19/15 0412 06/20/15 0353 06/21/15 0426  NA 135 136 133* 132* 133*  K 3.5 3.7 3.9  3.3* 3.9  CL 103 105 104 99* 103  CO2 22 20* 18* 21* 21*  GLUCOSE 103* 123* 119* 125* 109*  BUN 12 11 13 12 13   CREATININE 1.27* 0.95 1.04 1.31* 1.14  CALCIUM 8.7* 8.9 8.7* 8.6* 8.4*   CBC:  Recent Labs Lab 06/16/15 1707 06/16/15 2037 06/18/15 0609 06/19/15 0412 06/20/15 0353 06/21/15 0426  WBC 6.5  --  10.8* 12.6* 14.2* 9.7  NEUTROABS 4.0  --   --   --   --   --   HGB 12.5* 13.3 11.8* 12.1* 12.2* 10.4*  HCT 38.9* 39.0 36.3*  36.7* 37.8* 30.6*  MCV 97.3  --  95.8 97.9 95.9 95.3  PLT 299  --  263 251 288 238   Cardiac Enzymes:  Recent Labs Lab 06/17/15 0157 06/20/15 1115 06/20/15 1607 06/20/15 2220  TROPONINI <0.03 0.03 0.03 <0.03   CBG:  Recent Labs Lab 06/20/15 0722 06/20/15 1154 06/20/15 1651 06/20/15 2116 06/21/15 0719  GLUCAP 115* 128* 119* 124* 91   SIGNED: Time coordinating discharge: 30 minutes  MAGICK-Avondre Richens, MD  [facility] 06/21/2015, 11:08 AM Pager [phone]  If 7PM-7AM, please contact night-coverage [email or web address] Password [email or web address]

## 2015-06-21 NOTE — Progress Notes (Signed)
Physical Therapy Treatment Patient Details Name: Herbert Marquez MRN: [id number] DOB: Jun 14, 1927 Today's Date: 06/21/2015    History of Present Illness Patient is an 82 yo male admitted 06/16/15 after syncopal episode with transient Rt weakness and AMS.  Patient then developed more significant symptoms of Rt-sided weakness, confusion.  MRI showed Lt thalamic infarct, and repeat MRI showed bilateral PCA infarcts.    PMH:  HTN, DM, TIA, MI    PT Comments    Pt is making good progress towards goals.  Despite looking like he has the same assist level, I believe he did better today lighter mod and lighter max assist for sit to stand and stand pivot.  He also preformed transfers several times today as well as pre gait activities in standing with RW and right knee blocked (forward and backward stepping with two person mod assist).  Pt is very motivated to get to rehab and get better.    Follow Up Recommendations  CIR     Equipment Recommendations  Wheelchair (measurements PT);Wheelchair cushion (measurements PT);Rolling walker with 5" wheels    Recommendations for Other Services   NA     Precautions / Restrictions Precautions Precautions: Fall Precaution Comments: right side weakness Restrictions Weight Bearing Restrictions: No    Mobility  Bed Mobility               General bed mobility comments: Pt seated in the recliner chair upon entering the room.   Transfers Overall transfer level: Needs assistance Equipment used: Rolling walker (2 wheeled);None Transfers: Sit to/from [facility] Sit to Stand: +2 physical assistance;Mod assist Stand pivot transfers: +2 physical assistance;Max assist       General transfer comment: Two person mod assist to stand from recliner chair with RW.  Right knee blocked and right hand manually assisted to stay on recliner armrest while pushing up and to help find the RW once standing.  Verbal cues for safe hand placement.  Pt sat  back down in preparation for the transfer as the walker was serving as an obstacle and therapist felt safer closer to the pt.  Stand pivot transfer x 2 to the pt's left side giiving him the opportunity to reach with his left hand for the bed rail and then subsequently the armrest on the recliner chair.  Therapist blocked the pt's right knee while he took 1-2 pivotal steps to the bed and then the recliner with max assist to support trunk.       Modified Rankin (Stroke Patients Only) Modified Rankin (Stroke Patients Only) Pre-Morbid Rankin Score: No significant disability Modified Rankin: Severe disability     Balance Overall balance assessment: Needs assistance Sitting-balance support: Feet supported;Single extremity supported Sitting balance-Leahy Scale: Fair Sitting balance - Comments: Needs verbal cues and at times manual assist to engage right arm and hand in assisting with transfer and sitting stability.  He is attending to it more than last session and has better control after lifting it up to bring it down.    Standing balance support: Bilateral upper extremity supported Standing balance-Leahy Scale: Poor                      Cognition Arousal/Alertness: Awake/alert Behavior During Therapy: WFL for tasks assessed/performed Overall Cognitive Status: Impaired/Different from baseline     Current Attention Level: Selective           General Comments: Pt able to follow commands for pre gait and transfer, does not attend  well to right arm or leg unless he is visually looking at them.             Pertinent Vitals/Pain Pain Assessment: No/denies pain           PT Goals (current goals can now be found in the care plan section) Acute Rehab PT Goals Patient Stated Goal: to be independent again Progress towards PT goals: Progressing toward goals    Frequency  Min 4X/week    PT Plan Current plan remains appropriate       End of Session Equipment Utilized  During Treatment: Gait belt Activity Tolerance: Patient limited by fatigue Patient left: in chair;with call bell/phone within reach;with chair alarm set;with family/visitor present     Time: [id number] PT Time Calculation (min) (ACUTE ONLY): 19 min  Charges:  $Therapeutic Activity: 23-37 mins                     Herbert Marquez B. [place], [place], DPT [phone]   06/21/2015, 10:59 AM

## 2015-06-21 NOTE — Discharge Instructions (Signed)
Stroke Prevention Some medical conditions and behaviors are associated with an increased chance of having a stroke. You may prevent a stroke by making healthy choices and managing medical conditions. HOW CAN I REDUCE MY RISK OF HAVING A STROKE?   Stay physically active. Get at least 30 minutes of activity on most or all days.  Do not smoke. It may also be helpful to avoid exposure to secondhand smoke.  Limit alcohol use. Moderate alcohol use is considered to be:  No more than 2 drinks per day for men.  No more than 1 drink per day for nonpregnant women.  Eat healthy foods. This involves:  Eating 5 or more servings of fruits and vegetables a day.  Making dietary changes that address high blood pressure (hypertension), high cholesterol, diabetes, or obesity.  Manage your cholesterol levels.  Making food choices that are high in fiber and low in saturated fat, trans fat, and cholesterol may control cholesterol levels.  Take any prescribed medicines to control cholesterol as directed by your health care provider.  Manage your diabetes.  Controlling your carbohydrate and sugar intake is recommended to manage diabetes.  Take any prescribed medicines to control diabetes as directed by your health care provider.  Control your hypertension.  Making food choices that are low in salt (sodium), saturated fat, trans fat, and cholesterol is recommended to manage hypertension.  Ask your health care provider if you need treatment to lower your blood pressure. Take any prescribed medicines to control hypertension as directed by your health care provider.  If you are 18-39 years of age, have your blood pressure checked every 3-5 years. If you are 40 years of age or older, have your blood pressure checked every year.  Maintain a healthy weight.  Reducing calorie intake and making food choices that are low in sodium, saturated fat, trans fat, and cholesterol are recommended to manage  weight.  Stop drug abuse.  Avoid taking birth control pills.  Talk to your health care provider about the risks of taking birth control pills if you are over 35 years old, smoke, get migraines, or have ever had a blood clot.  Get evaluated for sleep disorders (sleep apnea).  Talk to your health care provider about getting a sleep evaluation if you snore a lot or have excessive sleepiness.  Take medicines only as directed by your health care provider.  For some people, aspirin or blood thinners (anticoagulants) are helpful in reducing the risk of forming abnormal blood clots that can lead to stroke. If you have the irregular heart rhythm of atrial fibrillation, you should be on a blood thinner unless there is a good reason you cannot take them.  Understand all your medicine instructions.  Make sure that other conditions (such as anemia or atherosclerosis) are addressed. SEEK IMMEDIATE MEDICAL CARE IF:   You have sudden weakness or numbness of the face, arm, or leg, especially on one side of the body.  Your face or eyelid droops to one side.  You have sudden confusion.  You have trouble speaking (aphasia) or understanding.  You have sudden trouble seeing in one or both eyes.  You have sudden trouble walking.  You have dizziness.  You have a loss of balance or coordination.  You have a sudden, severe headache with no known cause.  You have new chest pain or an irregular heartbeat. Any of these symptoms may represent a serious problem that is an emergency. Do not wait to see if the symptoms will   go away. Get medical help at once. Call your local emergency services (911 in [place] Do not drive yourself to the hospital.   This information is not intended to replace advice given to you by your health care provider. Make sure you discuss any questions you have with your health care provider.   Document Released: 03/29/2004 Document Revised: 03/12/2014 Document Reviewed:  08/22/2012 Elsevier Interactive Patient Education 2016 [employer]  
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
## 2015-06-21 NOTE — Plan of Care (Signed)
Problem: Self-Care: Goal: Ability to participate in self-care as condition permits will improve Outcome: Progressing Patient able to participate more in ADLs, self feeding has improved, participating with PT

## 2015-06-21 NOTE — Progress Notes (Signed)
Rehab admissions - I met with patient and his wife at the bedside.  They are interested in inpatient rehab prior to home with wife.  Bed available today and will admit to acute inpatient rehab today.  Call me for questions.  #[phone]

## 2015-06-21 NOTE — Care Management Note (Signed)
Case Management Note  Patient Details  Name: KAYNEN HRUZA MRN: [id number] Date of Birth: 01-10-1928  Subjective/Objective:  Patient for dc to CIR today.                   Action/Plan:   Expected Discharge Date:                  Expected Discharge Plan:  [place]  In-House Referral:     Discharge planning Services  CM Consult  Post Acute Care Choice:    Choice offered to:     DME Arranged:    DME Agency:     HH Arranged:    [place] Agency:     Status of Service:  Completed, signed off  Medicare Important Message Given:  Yes Date Medicare IM Given:    Medicare IM give by:    Date Additional Medicare IM Given:    Additional Medicare Important Message give by:     If discussed at [place] of Stay Meetings, dates discussed:    Additional Comments:  Zenon Mayo, RN 06/21/2015, 2:20 PM

## 2015-06-21 NOTE — Interval H&P Note (Signed)
Herbert Marquez was admitted today to Inpatient Rehabilitation with the diagnosis of left thalamic infarct.  The patient's history has been reviewed, patient examined, and there is no change in status.  Patient continues to be appropriate for intensive inpatient rehabilitation.  I have reviewed the patient's chart and labs.  Questions were answered to the patient's satisfaction. The PAPE has been reviewed and assessment remains appropriate.  SWARTZ,ZACHARY T 06/21/2015, 6:16 PM

## 2015-06-22 ENCOUNTER — Inpatient Hospital Stay (HOSPITAL_COMMUNITY): Payer: Medicare Other | Admitting: Speech Pathology

## 2015-06-22 ENCOUNTER — Inpatient Hospital Stay (HOSPITAL_COMMUNITY): Payer: Medicare Other | Admitting: Occupational Therapy

## 2015-06-22 ENCOUNTER — Inpatient Hospital Stay (HOSPITAL_COMMUNITY): Payer: Medicare Other | Admitting: Physical Therapy

## 2015-06-22 DIAGNOSIS — I69393 Ataxia following cerebral infarction: Secondary | ICD-10-CM

## 2015-06-22 LAB — CBC WITH DIFFERENTIAL/PLATELET
Basophils Absolute: 0 10*3/uL (ref 0.0–0.1)
Basophils Relative: 0 %
Eosinophils Absolute: 0.4 10*3/uL (ref 0.0–0.7)
Eosinophils Relative: 5 %
HCT: 35.8 % — ABNORMAL LOW (ref 39.0–52.0)
Hemoglobin: 11.7 g/dL — ABNORMAL LOW (ref 13.0–17.0)
Lymphocytes Relative: 14 %
Lymphs Abs: 1.2 10*3/uL (ref 0.7–4.0)
MCH: 31.2 pg (ref 26.0–34.0)
MCHC: 32.7 g/dL (ref 30.0–36.0)
MCV: 95.5 fL (ref 78.0–100.0)
Monocytes Absolute: 1 10*3/uL (ref 0.1–1.0)
Monocytes Relative: 11 %
Neutro Abs: 6.3 10*3/uL (ref 1.7–7.7)
Neutrophils Relative %: 70 %
Platelets: 335 10*3/uL (ref 150–400)
RBC: 3.75 MIL/uL — ABNORMAL LOW (ref 4.22–5.81)
RDW: 14.5 % (ref 11.5–15.5)
WBC: 9 10*3/uL (ref 4.0–10.5)

## 2015-06-22 LAB — COMPREHENSIVE METABOLIC PANEL
ALT: 53 U/L (ref 17–63)
AST: 73 U/L — ABNORMAL HIGH (ref 15–41)
Albumin: 2.6 g/dL — ABNORMAL LOW (ref 3.5–5.0)
Alkaline Phosphatase: 64 U/L (ref 38–126)
Anion gap: 10 (ref 5–15)
BUN: 11 mg/dL (ref 6–20)
CO2: 23 mmol/L (ref 22–32)
Calcium: 9 mg/dL (ref 8.9–10.3)
Chloride: 100 mmol/L — ABNORMAL LOW (ref 101–111)
Creatinine, Ser: 1.12 mg/dL (ref 0.61–1.24)
GFR calc Af Amer: >60 mL/min (ref >60)
GFR calc non Af Amer: 58 mL/min — ABNORMAL LOW (ref >60)
Glucose, Bld: 111 mg/dL — ABNORMAL HIGH (ref 65–99)
Potassium: 3.8 mmol/L (ref 3.5–5.1)
Sodium: 133 mmol/L — ABNORMAL LOW (ref 135–145)
Total Bilirubin: 0.5 mg/dL (ref 0.3–1.2)
Total Protein: 6.2 g/dL — ABNORMAL LOW (ref 6.5–8.1)

## 2015-06-22 LAB — GLUCOSE, CAPILLARY
Glucose-Capillary: 95 mg/dL (ref 65–99)
Glucose-Capillary: 85 mg/dL (ref 65–99)
Glucose-Capillary: 90 mg/dL (ref 65–99)
Glucose-Capillary: 105 mg/dL — ABNORMAL HIGH (ref 65–99)
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
## 2015-06-22 NOTE — Evaluation (Signed)
Speech Language Pathology Assessment and Plan  Patient Details  Name: Herbert Marquez MRN: [id number] Date of Birth: 1927-12-21  SLP Diagnosis: Cognitive Impairments;Dysphagia  Rehab Potential: Good ELOS: 10-14 days    Today's Date: 06/22/2015 SLP Individual Time: 1300-1405 SLP Individual Time Calculation (min): 65 min   Problem List:  Patient Active Problem List   Diagnosis Date Noted  . Thalamic infarction (HCC) 06/21/2015  . Sinus tachycardia (HCC) 06/20/2015  . Benign essential HTN   . Type 2 diabetes mellitus with complication, without long-term current use of insulin (HCC)   . Coronary artery disease involving native coronary artery of native heart without angina pectoris   . Dysphagia as late effect of cerebrovascular disease   . Low grade fever   . Leukocytosis   . Hypokalemia   . Hyponatremia   . AKI (acute kidney injury) (HCC)   . PSVT (paroxysmal supraventricular tachycardia) (HCC)   . Cerebral thrombosis with cerebral infarction 06/17/2015  . Acute CVA (cerebrovascular accident) (HCC) 06/17/2015  . Stroke (cerebrum) (HCC)   . HLD (hyperlipidemia)   . Syncope 06/16/2015  . Hyperlipidemia 04/22/2014  . UTI (lower urinary tract infection) 08/31/2011  . Acute renal failure (HCC) 08/31/2011  . Fracture of femoral neck, left (HCC) 03/09/2011  . DM (diabetes mellitus) (HCC) 03/09/2011  . Obesity 03/09/2011  . COPD (chronic obstructive pulmonary disease) (HCC) 03/09/2011  . Essential hypertension 03/09/2011  . CAD (coronary artery disease) 09/20/2010  . Diabetes mellitus 09/20/2010  . HTN (hypertension) 09/20/2010   Past Medical History:  Past Medical History  Diagnosis Date  . Coronary artery disease     a. s/p PCI of RCA in 2006  . Hyperlipidemia   . Hypertension   . BPH (benign prostatic hypertrophy)   . TIA (transient ischemic attack)     Approximately 6 weeks post-cardiac catheterization.   . Type II diabetes mellitus (HCC)     "prediabetic; lost alot  of weight; not diabetic now" (06/16/2015)  . GERD (gastroesophageal reflux disease)   . Arthritis     "pretty much all over"   . Basal cell carcinoma     "several burned off his body, face, head"  . CVA (cerebral infarction)     a. 06/2015: left thalamic and bilateral PCA   Past Surgical History:  Past Surgical History  Procedure Laterality Date  . Coronary angioplasty with stent placement  10/2004    stenting x 2 to RCA  . Cataract extraction, bilateral    . Cardiovascular stress test  07/01/2007    EF 74%  . Hip arthroplasty  03/09/2011    Procedure: ARTHROPLASTY BIPOLAR HIP;  Surgeon: Shelda Pal;  Location: [facility];  Service: Orthopedics;  Laterality: Left;  . Hernia repair    . Laparoscopic incisional / umbilical / ventral hernia repair      "below his naval"    Assessment / Plan / Recommendation Clinical Impression Pt participated in Speech Language and Cognitive Eval as well as Clinical Assessment of Swallowing. Pt tolerated all consistencies trialed [facility]. No overt s/s aspiration. Pt declined a diet upgrade at this time, however he was agreeable to work toward  dys 3/regular. Pt demonstrates mild high level cognitve impairment primarily related to memory. Pt would benefit from skilled SLP therapy for further services to maximize functional independence.  Skilled Therapeutic Interventions          Pt participated in the [facility] adjusted for difficulty with writing due to affected extremity with a score of 21/26  with primary limitations in areas of delayed recall and mathematics. Discussed limitations as they relate to independence with higher level cognitive tasks. Pt verbalized agreement saying, "I was surprised I had some trouble on that little quiz." Reviewed safety in the room. Pt verbalized understanding.  SLP Assessment  Patient will need skilled Speech Lanaguage Pathology Services during CIR admission    Recommendations  SLP Diet Recommendations: Dysphagia 2 (Fine  chop);Thin Liquid Administration via: Cup;Straw Medication Administration: Whole meds with puree Supervision: Intermittent supervision to cue for compensatory strategies Compensations: Slow rate;Small sips/bites Postural Changes and/or Swallow Maneuvers: Seated upright 90 degrees;Upright 30-60 min after meal Oral Care Recommendations: Oral care BID Patient destination: Home Follow up Recommendations: None (pending progress) Equipment Recommended: None recommended by SLP    SLP Frequency 3 to 5 out of 7 days   SLP Duration  SLP Intensity  SLP Treatment/Interventions 10-14 days  Minumum of 1-2 x/day, 30 to 90 minutes  Cognitive remediation/compensation;Dysphagia/aspiration precaution training;Patient/family education;Medication managment;Functional tasks;Therapeutic Activities;Multimodal communication approach;Cueing hierarchy    Pain Pain Assessment Pain Assessment: No/denies pain  Prior Functioning Cognitive/Linguistic Baseline: Within functional limits Type of Home: House  Lives With: Spouse Available Help at Discharge: Family;Available 24 hours/day Vocation: Retired  Function:  Eating Eating   Modified Consistency Diet: Yes Eating Assist Level: Supervision or verbal cues           Cognition Comprehension Comprehension assist level: Follows basic conversation/direction with extra time/assistive device  Expression   Expression assist level: Expresses basic needs/ideas: With extra time/assistive device  Social Interaction Social Interaction assist level: Interacts appropriately with others with medication or extra time (anti-anxiety, antidepressant).  Problem Solving Problem solving assist level: Solves basic 90% of the time/requires cueing < 10% of the time  Memory Memory assist level: Recognizes or recalls 50 - 74% of the time/requires cueing 25 - 49% of the time   Short Term Goals: Week 1: SLP Short Term Goal 1 (Week 1): Pt to demonstrate money counting/change  making at mod I level. SLP Short Term Goal 2 (Week 1): Pt to demonstrate reading comprehension at the functional level at mod I. SLP Short Term Goal 3 (Week 1): Pt to demonstrate recall of new information with min A. SLP Short Term Goal 4 (Week 1): Pt to manage Dys 3/regular consistency snacks at mod I to demonstrate readiness for diet upgrade.  Refer to Care Plan for Long Term Goals  Recommendations for other services: None  Discharge Criteria: Patient will be discharged from SLP if patient refuses treatment 3 consecutive times without medical reason, if treatment goals not met, if there is a change in medical status, if patient makes no progress towards goals or if patient is discharged from hospital.  The above assessment, treatment plan, treatment alternatives and goals were discussed and mutually agreed upon: by patient  Vinetta Bergamo MA, CCC-SLP 06/22/2015, 3:03 PM

## 2015-06-22 NOTE — Progress Notes (Signed)
Ankit Lorie Phenix, MD Physician Signed Physical Medicine and Rehabilitation Consult Note 06/20/2015 5:47 AM  Related encounter: ED to Hosp-Admission (Discharged) from 06/16/2015 in [place] Collapse All        Physical Medicine and Rehabilitation Consult Reason for Consult: Left Thalamic infarct Referring Physician: [facility]   HPI: KEYANDRE MOYO is a 82 y.o. right handed male with history of hypertension, type 2 diabetes mellitus, coronary artery disease with stenting. Patient lives with spouse, who is only able to provide supervision at discharge. Independent with a cane prior to admission due to knee and hip pain. One level home with 2 steps to entry. Presented 06/16/2015 with syncope, dizziness. No seizure activity noted. Initially in the ED workup unremarkable. Patient subsequently developed right sided weakness and facial droop in the ED with a blank stare and was unresponsive for at least 1 minute. Cranial CT scan negative. Patient did not receive TPA. Troponin negative. MRI of the brain showed acute 8 x 12 mm left thalamic infarct. Chronic small vessel disease. 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 High-grade stenosis right P2 segment. MRA of neck with no high-grade stenosis. CTA of neck probable moderate to high-grade stenosis left internal carotid artery origin, limited by motion. Less than 50% stenosis right internal carotid artery. CTA of head occluded right V4 segment. Occluded left posterior cerebral artery at origin. Occluded proximal right P3 segment. EEG consistent with mild generalized nonspecific cerebral dysfunction/encephalopathy. No seizure activity. Echocardiogram ejection fraction 60% no wall motion abnormalities. Grade 1 diastolic dysfunction. Neurology consulted maintain on aspirin and Plavix for CVA prophylaxis. Subcutaneous Lovenox for DVT prophylaxis. Dysphagia #2 thin liquid diet. On 06/19/2015 patient with  low-grade fever and some nonspecific tachycardia. Sepsis protocol initiated. CT of the chest without acute abnormality. White blood cell count 14,200 maintain on broad-spectrum antibiotics. Mild hypokalemia 3.3. Creatinine 1.31. Urine culture negative nitrite. Physical therapy evaluation completed with recommendations of physical medicine rehabilitation consult.   Review of Systems  Constitutional: Negative for chills.  HENT: Negative for hearing loss.  Eyes: Negative for blurred vision and double vision.  Respiratory: Negative for cough and shortness of breath.  Cardiovascular: Positive for palpitations. Negative for chest pain and leg swelling.  Gastrointestinal: Positive for constipation. Negative for nausea and vomiting.   GERD  Genitourinary: Positive for urgency. Negative for dysuria and hematuria.  Musculoskeletal: Positive for myalgias and joint pain.  Skin: Negative for rash.  Neurological: Positive for dizziness and weakness. Negative for tremors and headaches.  All other systems reviewed and are negative.  Past Medical History  Diagnosis Date  . Coronary artery disease   . Hyperlipidemia   . Hypertension   . BPH (benign prostatic hypertrophy)   . TIA (transient ischemic attack)     Approximately 6 weeks post-cardiac catheterization.   . MI (myocardial infarction) ([place]) 10/2004  . Type II diabetes mellitus ([place])     "prediabetic; lost alot of weight; not diabetic now" (06/16/2015)  . GERD (gastroesophageal reflux disease)   . Arthritis     "pretty much all over"   . Basal cell carcinoma     "several burned off his body, face, head"   Past Surgical History  Procedure Laterality Date  . Coronary angioplasty with stent placement  10/2004    stenting x 2 to RCA  . Cataract extraction, bilateral    . Cardiovascular stress test  07/01/2007    EF 74%  . Hip arthroplasty  03/09/2011    Procedure:  ARTHROPLASTY BIPOLAR HIP; Surgeon: Mauri Pole; Location: [facility]; Service: Orthopedics; Laterality: Left;  . Hernia repair    . Laparoscopic incisional / umbilical / ventral hernia repair      "below his naval"   Family History  Problem Relation Age of Onset  . Lung cancer Father   . Lung cancer Brother   . Hypertension Mother    Social History:  reports that he quit smoking about 44 years ago. He has never used smokeless tobacco. He reports that he drinks about 7.2 oz of alcohol per week. He reports that he does not use illicit drugs. Allergies: No Known Allergies Medications Prior to Admission  Medication Sig Dispense Refill  . aspirin 81 MG tablet Take 81 mg by mouth daily.     [name] atorvastatin (LIPITOR) 40 MG tablet TAKE ONE TABLET BY MOUTH ONCE DAILY 90 tablet 2  . diphenhydrAMINE (BENADRYL) 25 MG tablet Take 25 mg by mouth every 6 (six) hours as needed. Rhinorrhea    . metoprolol succinate (TOPROL-XL) 100 MG 24 hr tablet TAKE ONE TABLET BY MOUTH ONCE DAILY WITH MEALS 90 tablet 3  . Multiple Vitamin (MULTIVITAMIN) tablet Take 1 tablet by mouth daily.     . nitroGLYCERIN (NITROSTAT) 0.4 MG SL tablet DISSOLVE ONE TABLET UNDER THE TONGUE EVERY 5 MINUTES AS NEEDED FOR CHEST PAIN. DO NOT EXCEED A TOTAL OF 3 DOSES IN 15 MINUTES 25 tablet 3  . omeprazole (PRILOSEC) 20 MG capsule Take 20 mg by mouth daily.     . pioglitazone (ACTOS) 30 MG tablet Take 15 mg by mouth daily.     . Tamsulosin HCl (FLOMAX) 0.4 MG CAPS Take by mouth daily after supper.    . triamterene-hydrochlorothiazide (MAXZIDE-25) 37.5-25 MG tablet Take 1 tablet by mouth daily.      Home: Home Living Family/patient expects to be discharged to:: Private residence Living Arrangements: Spouse/significant other Available Help at Discharge: Family, Available 24 hours/day Type of Home: House Home Access: Stairs to enter [employer] of Steps: 2 Entrance Stairs-Rails: Right Home Layout: One level Home Equipment: Environmental consultant - 2 wheels, Cane - single point, Bedside commode, Shower seat  Functional History: Prior Function Level of Independence: Independent with assistive device(s) Comments: Uses cane for gait. Functional Status:  Mobility: Bed Mobility Overal bed mobility: Needs Assistance Bed Mobility: Rolling, Sidelying to Sit, Sit to Sidelying Rolling: Min assist Sidelying to sit: Mod assist, +2 for physical assistance Sit to sidelying: Mod assist, +2 for physical assistance General bed mobility comments: Verbal cues for technique - step-by-step. Patient with decreased control of RUE when reaching for rail to roll. Assist to bring hips onto side. Assist to bring trunk to sitting position. Assist to scoot hips to EOB with use of bed pad. Patient unable to functionally use RUE to assist - decreased control. Patient able to maintain static sitting balance with min guard assist. Required min to mod assist to maintain balance with dynamic activity - falls posteriorly. Returned to sidelying with +2 assist to control trunk and bring LE's onto bed. Transfers Overall transfer level: Needs assistance Equipment used: 2 person hand held assist Transfers: Sit to/from Stand Sit to Stand: Max assist, +2 physical assistance General transfer comment: Verbal cues to stand. Required +2 max assist to initiate sit to stand. Support provided to RUE and Rt knee. Patient unable to reach fully upright position. Difficulty keeping body in midline. Difficulty supporting weight on RLE. Ambulation/Gait General Gait Details: Unable  ADL:    Cognition: Cognition Overall Cognitive Status: Within Functional Limits for tasks assessed Orientation Level: Oriented to person, Oriented to place, Oriented to situation, Disoriented to time Cognition Arousal/Alertness: Awake/alert Behavior During Therapy: WFL for tasks  assessed/performed Overall Cognitive Status: Within Functional Limits for tasks assessed  Blood pressure 141/73, pulse 80, temperature 97.8 F (36.6 C), temperature source Oral, resp. rate 23, height 5\' 7"  (1.702 m), weight 89 kg (196 lb 3.4 oz), SpO2 97 %. Physical Exam  Vitals reviewed. Constitutional: He is oriented to person, place, and time. He appears well-developed and well-nourished.  HENT:  Head: Normocephalic and atraumatic.  Eyes: Conjunctivae and EOM are normal.  Neck: Normal range of motion. Neck supple. No thyromegaly present.  Cardiovascular: Regular rhythm.  Tachycardic  Respiratory: Effort normal and breath sounds normal. No respiratory distress.  GI: Soft. Bowel sounds are normal. He exhibits no distension.  Musculoskeletal: He exhibits no edema or tenderness.  Neurological: He is alert and oriented to person, place, and time.  Makes good eye contact with examiner.  Follows simple commands. Left sided tongue deviation Sensation intact to light touch Motor: LUE: 4+/5 proximal to distal RUE: 4-/5 proximal to distal LLE: hip flexion 3+/5, knee extension 4/5, ankle dorsi/plantar flexion 4/5 RLE: hip flexion 2+/5, knee extension 3-/5, ankle dorsi/plantarflexion 4-/5  Skin: Skin is warm and dry.  Psychiatric: He has a normal mood and affect. His behavior is normal.     Lab Results Last 24 Hours    Results for orders placed or performed during the hospital encounter of 06/16/15 (from the past 24 hour(s))  Glucose, capillary Status: Abnormal   Collection Time: 06/19/15 7:50 AM  Result Value Ref Range   Glucose-Capillary 117 (H) 65 - 99 mg/dL  Lactic acid, plasma Status: None   Collection Time: 06/19/15 11:01 AM  Result Value Ref Range   Lactic Acid, Venous 1.5 0.5 - 2.0 mmol/L  Procalcitonin Status: None   Collection Time: 06/19/15 11:01 AM  Result Value Ref Range   Procalcitonin <0.10 ng/mL  Protime-INR Status:  Abnormal   Collection Time: 06/19/15 11:01 AM  Result Value Ref Range   Prothrombin Time 16.5 (H) 11.6 - 15.2 seconds   INR 1.32 0.00 - 1.49  APTT Status: None   Collection Time: 06/19/15 11:01 AM  Result Value Ref Range   aPTT 36 24 - 37 seconds  Glucose, capillary Status: Abnormal   Collection Time: 06/19/15 11:55 AM  Result Value Ref Range   Glucose-Capillary 114 (H) 65 - 99 mg/dL  Urinalysis, Routine w reflex microscopic (not at [facility]) Status: Abnormal   Collection Time: 06/19/15 1:46 PM  Result Value Ref Range   Color, Urine AMBER (A) YELLOW   APPearance CLEAR CLEAR   Specific Gravity, Urine 1.027 1.005 - 1.030   pH 5.5 5.0 - 8.0   Glucose, UA NEGATIVE NEGATIVE mg/dL   Hgb urine dipstick NEGATIVE NEGATIVE   Bilirubin Urine SMALL (A) NEGATIVE   Ketones, ur 15 (A) NEGATIVE mg/dL   Protein, ur 100 (A) NEGATIVE mg/dL   Nitrite NEGATIVE NEGATIVE   Leukocytes, UA NEGATIVE NEGATIVE  Urine microscopic-add on Status: Abnormal   Collection Time: 06/19/15 1:46 PM  Result Value Ref Range   Squamous Epithelial / LPF 0-5 (A) NONE SEEN   WBC, UA 0-5 0 - 5 WBC/hpf   RBC / HPF 0-5 0 - 5 RBC/hpf   Bacteria, UA RARE (A) NONE SEEN   Casts HYALINE CASTS (A) NEGATIVE   Urine-Other MUCOUS PRESENT  Lactic acid, plasma Status: None   Collection Time: 06/19/15 2:26 PM  Result Value Ref Range   Lactic Acid, Venous 1.9 0.5 - 2.0 mmol/L  Glucose, capillary Status: Abnormal   Collection Time: 06/19/15 4:39 PM  Result Value Ref Range   Glucose-Capillary 111 (H) 65 - 99 mg/dL  Glucose, capillary Status: None   Collection Time: 06/19/15 9:17 PM  Result Value Ref Range   Glucose-Capillary 91 65 - 99 mg/dL  CBC Status: Abnormal   Collection Time: 06/20/15 3:53 AM  Result Value Ref Range   WBC 14.2 (H) 4.0 -  10.5 K/uL   RBC 3.94 (L) 4.22 - 5.81 MIL/uL   Hemoglobin 12.2 (L) 13.0 - 17.0 g/dL   HCT 37.8 (L) 39.0 - 52.0 %   MCV 95.9 78.0 - 100.0 fL   MCH 31.0 26.0 - 34.0 pg   MCHC 32.3 30.0 - 36.0 g/dL   RDW 15.1 11.5 - 15.5 %   Platelets 288 150 - 400 K/uL  Basic metabolic panel Status: Abnormal   Collection Time: 06/20/15 3:53 AM  Result Value Ref Range   Sodium 132 (L) 135 - 145 mmol/L   Potassium 3.3 (L) 3.5 - 5.1 mmol/L   Chloride 99 (L) 101 - 111 mmol/L   CO2 21 (L) 22 - 32 mmol/L   Glucose, Bld 125 (H) 65 - 99 mg/dL   BUN 12 6 - 20 mg/dL   Creatinine, Ser 1.31 (H) 0.61 - 1.24 mg/dL   Calcium 8.6 (L) 8.9 - 10.3 mg/dL   GFR calc non Af Amer 48 (L) >60 mL/min   GFR calc Af Amer 56 (L) >60 mL/min   Anion gap 12 5 - 15      Imaging Results (Last 48 hours)    Ct Chest Wo Contrast  06/19/2015 CLINICAL DATA: Tachycardia and low-grade fever EXAM: CT CHEST WITHOUT CONTRAST TECHNIQUE: Multidetector CT imaging of the chest was performed following the standard protocol without IV contrast. COMPARISON: 03/09/2011 FINDINGS: Lungs are well aerated bilaterally. Some mild fibrotic changes are seen. No focal confluent infiltrate or sizable effusion is seen. No parenchymal nodules are noted. The thoracic inlet is within normal limits. The thoracic aorta and its branches demonstrate calcific changes without aneurysmal dilatation. The coronary arteries demonstrate heavy calcifications. No hilar or mediastinal adenopathy is noted. The visualized upper abdomen reveals no acute abnormality. The osseous structures show degenerative change of the thoracic spine. IMPRESSION: Chronic fibrotic changes without acute abnormality. Electronically Signed By: Inez Catalina M.D. On: 06/19/2015 17:43     Assessment/Plan: Diagnosis: Left Thalamic infarct Labs and images independently reviewed. Records reviewed and summated  above. Stroke: Continue secondary stroke prophylaxis and Risk Factor Modification listed below:  Antiplatelet therapy Blood Pressure Management: Continue current medication with prn's with permisive HTN per primary team Statin Agent Diabetes management Right sided hemiparesis Motor recovery: Fluoxetine  1. Does the need for close, 24 hr/day medical supervision in concert with the patient's rehab needs make it unreasonable for this patient to be served in a less intensive setting? Yes  2. Co-Morbidities requiring supervision/potential complications: HTN (monitor and provide prns in accordance with increased physical exertion and pain), type 2 diabetes mellitus (Monitor in accordance with exercise and adjust meds as necessary), coronary artery disease with stenting (cont meds), Dysphagia (cont SLP, consider vital stim), low-grade fevers (cont to monitor and consider further workup if necessary), leukocytosis (cont to monitor for signs and symptoms of infection, further workup if indicated), hypokalemia (cont to monitor, replete as necessary),  Hyponatremia (cont to monitor, treat if necessary), AKI (avoid nephrotoxic meds), tachypnea (monitor RR and O2 Sats with increased physical exertion) 3. Due to safety, disease management and patient education, does the patient require 24 hr/day rehab nursing? Yes 4. Does the patient require coordinated care of a physician, rehab nurse, PT (1-2 hrs/day, 5 days/week), OT (1-2 hrs/day, 5 days/week) and SLP (1-2 hrs/day, 5 days/week) to address physical and functional deficits in the context of the above medical diagnosis(es)? Yes Addressing deficits in the following areas: balance, endurance, locomotion, strength, transferring, bathing, toileting, swallowing and psychosocial support 5. Can the patient actively participate in an intensive therapy program of at least 3 hrs of therapy per day at least 5 days per week? Yes 6. The potential for patient to make  measurable gains while on inpatient rehab is excellent 7. Anticipated functional outcomes upon discharge from inpatient rehab are supervision and min assist with PT, supervision and min assist with OT, modified independent with SLP. 8. Estimated rehab length of stay to reach the above functional goals is: 20-23 days. 9. Does the patient have adequate social supports and living environment to accommodate these discharge functional goals? Potentially 10. Anticipated D/C setting: Home 11. Anticipated post D/C treatments: HH therapy and Home excercise program 12. Overall Rehab/Functional Prognosis: good  RECOMMENDATIONS: This patient's condition is appropriate for continued rehabilitative care in the following setting: CIR once medical conditions stabalize. Patient has agreed to participate in recommended program. Yes Note that insurance prior authorization may be required for reimbursement for recommended care.  Comment: Rehab Admissions Coordinator to follow up.  Delice Lesch, MD 06/20/2015       Revision History     Date/Time User Provider Type Action   06/20/2015 12:36 PM Ankit Lorie Phenix, MD Physician Sign   06/20/2015 6:41 AM Cathlyn Parsons, PA-C Physician Assistant Pend   View Details Report       Routing History     Date/Time From To Method   06/20/2015 12:36 PM Ankit Lorie Phenix, MD Thressa Sheller, MD Fax

## 2015-06-22 NOTE — Care Management Note (Signed)
[facility] Individual Statement of Services  Patient Name:  Herbert Marquez  Date:  06/22/2015  Welcome to the [facility].  Our goal is to provide you with an individualized program based on your diagnosis and situation, designed to meet your specific needs.  With this comprehensive rehabilitation program, you will be expected to participate in at least 3 hours of rehabilitation therapies Monday-Friday, with modified therapy programming on the weekends.  Your rehabilitation program will include the following services:  Physical Therapy (PT), Occupational Therapy (OT), Speech Therapy (ST), 24 hour per day rehabilitation nursing, Case Management (Social Worker), Rehabilitation Medicine, Nutrition Services and Pharmacy Services  Weekly team conferences will be held on Wednesday to discuss your progress.  Your Social Worker will talk with you frequently to get your input and to update you on team discussions.  Team conferences with you and your family in attendance may also be held.  Expected length of stay: 16-18 days Overall anticipated outcome: supervision with cueing  Depending on your progress and recovery, your program may change. Your Social Worker will coordinate services and will keep you informed of any changes. Your Social Worker's name and contact numbers are listed  below.  The following services may also be recommended but are not provided by the [facility] will be made to provide these services after discharge if needed.  Arrangements include referral to agencies that provide these services.  Your insurance has been verified to be:  [place] Your primary doctor is:  Thressa Sheller  Pertinent information will be shared with your doctor and your insurance company.  Social Worker:  Ovidio Kin, [place] or [phone]  Information discussed with and copy given to patient by: Elease Hashimoto, 06/22/2015, 11:35 AM
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
## 2015-06-22 NOTE — Progress Notes (Signed)
Orthopedic Tech Progress Note Patient Details:  Herbert Marquez [birth year] [id number]  Ortho Devices Type of Ortho Device: Arm sling Ortho Device/Splint Location: rue Ortho Device/Splint Interventions: Application   Hildred Priest 06/22/2015, 12:35 PM

## 2015-06-22 NOTE — Progress Notes (Signed)
82 y.o. right handed male with history of hypertension,Tachycardia with PVCs, type 2 diabetes mellitus, coronary artery disease with stenting. Patient lives with spouse, who is only able to provide supervision at discharge. Independent with a cane prior to admission due to knee and hip pain. One level home with 2 steps to entry. Presented 06/16/2015 with syncope, dizziness. No seizure activity noted. Initially in the ED workup unremarkable. Patient subsequently developed right sided weakness and facial droop in the ED with a blank stare and was unresponsive for at least 1 minute. Cranial CT scan negative. Patient did not receive TPA. Troponin negative. MRI of the brain showed acute 8 x 12 mm left thalamic infarct. Chronic small vessel disease. 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 High-grade stenosis right P2 segment. MRA of neck with no high-grade stenosis. CTA of neck probable moderate to high-grade stenosis left internal carotid artery origin, limited by motion. Less than 50% stenosis right internal carotid artery. CTA of head occluded right V4 segment. Occluded left posterior cerebral artery at origin. Occluded proximal right P3 segment. EEG consistent with mild generalized nonspecific cerebral dysfunction/encephalopathy. No seizure activity. Echocardiogram ejection fraction 60% no wall motion abnormalities. Grade 1 diastolic dysfunction. Neurology consulted maintain on aspirin and Plavix for CVA prophylaxis. Subcutaneous Lovenox for DVT prophylaxis. Dysphagia #2 thin liquid diet. On 06/19/2015 patient with low-grade fever and some nonspecific tachycardiaIn the 120s. 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 Cardiology services follow-up low-dose beta blocker with metoprolol added and monitor closely for hypotension. Sepsis protocol initiated. Lactic acid 1.9. CT of the chest without acute abnormality. White blood cell count 14,200 maintain on  broad-spectrum antibiotics Subjective/Complaints:   Objective: Vital Signs: Blood pressure 138/72, pulse 78, temperature 98.5 F (36.9 C), temperature source Oral, resp. rate 18, height 5\' 7"  (1.702 m), SpO2 97 %. US Renal  06/20/2015  CLINICAL DATA:  Acute renal failure. EXAM: RENAL / URINARY TRACT ULTRASOUND COMPLETE COMPARISON:  CT, 04/29/2008 FINDINGS: Right Kidney: Length: 9.2 cm. Diffuse cortical thinning. Normal parenchymal echogenicity. Hypoechoic mass arises from the lower pole measuring 18 x 13 x 15 mm. This is likely a cyst but is not well enough defined to confidently diagnose a simple cyst. No other renal masses, no stones and no hydronephrosis. Left Kidney: Length: 11.1 cm. Diffuse cortical thinning. Normal parenchymal echogenicity. No masses or cysts. No stones or hydronephrosis. Bladder: Appears normal for degree of bladder distention. IMPRESSION: 1. Bilateral renal cortical thinning. 2. No hydronephrosis. 3. 18 mm lower pole right renal cyst, not fully characterized on this study. Recommend follow-up ultrasound in 4- 6 months to reassess. Electronically Signed   By: Lajean Manes M.D.   On: 06/20/2015 15:14   Results for orders placed or performed during the hospital encounter of 06/21/15 (from the past 72 hour(s))  Glucose, capillary     Status: Abnormal   Collection Time: 06/21/15  8:29 PM  Result Value Ref Range   Glucose-Capillary 158 (H) 65 - 99 mg/dL   Comment 1 Notify RN   Glucose, capillary     Status: None   Collection Time: 06/22/15  6:33 AM  Result Value Ref Range   Glucose-Capillary 95 65 - 99 mg/dL  CBC WITH DIFFERENTIAL     Status: Abnormal   Collection Time: 06/22/15  8:35 AM  Result Value Ref Range   WBC 9.0 4.0 - 10.5 K/uL   RBC 3.75 (L) 4.22 - 5.81 MIL/uL   Hemoglobin  11.7 (L) 13.0 - 17.0 g/dL   HCT 35.8 (L) 39.0 - 52.0 %   MCV 95.5 78.0 - 100.0 fL   MCH 31.2 26.0 - 34.0 pg   MCHC 32.7 30.0 - 36.0 g/dL   RDW 14.5 11.5 - 15.5 %   Platelets 335 150 -  400 K/uL   Neutrophils Relative % 70 %   Neutro Abs 6.3 1.7 - 7.7 K/uL   Lymphocytes Relative 14 %   Lymphs Abs 1.2 0.7 - 4.0 K/uL   Monocytes Relative 11 %   Monocytes Absolute 1.0 0.1 - 1.0 K/uL   Eosinophils Relative 5 %   Eosinophils Absolute 0.4 0.0 - 0.7 K/uL   Basophils Relative 0 %   Basophils Absolute 0.0 0.0 - 0.1 K/uL     HEENT: normal Cardio: RRR and no murmur Resp: CTA B/L and unlbored GI: BS positive and NT, ND Extremity:  No Edema Skin:   Intact Neuro: Alert/Oriented, Abnormal Sensory absent sensation in the right upper and right lower limb, Normal Motor and Abnormal FMC Ataxic/ dec FMC Musc/Skel:  Other patient has stiffness in the left knee but no evidence of effusion. There is swelling of the right wrist with bony deformity Gen. no acute distress   Assessment/Plan: 1. 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 Physiatrist is providing close team supervision and 24 hour management of active medical problems listed below. Physiatrist and rehab team continue to assess barriers to discharge/monitor patient progress toward functional and medical goals. FIM:       Function - Toileting Toileting steps completed by patient: Adjust clothing prior to toileting, Adjust clothing after toileting Toileting steps completed by helper: Adjust clothing prior to toileting, Adjust clothing after toileting Assist level: More than reasonable time (Patient is hard of hearing/chronic back pain)           Function - Comprehension Comprehension assistive device: Hearing aids Comprehension assist level: Follows basic conversation/direction with extra time/assistive device  Function - Expression Expression: Verbal Expression assist level: Expresses basic needs/ideas: With extra time/assistive device  Function - Social  Interaction Social Interaction assist level: Interacts appropriately with others with medication or extra time (anti-anxiety, antidepressant).  Function - Problem Solving Problem solving assist level: Solves complex problems: With extra time, Solves complex problems: Recognizes & self-corrects  Function - Memory Memory assist level: More than reasonable amount of time, Recognizes or recalls 90% of the time/requires cueing < 10% of the time  Medical Problem List and Plan: 1.  Right side weakness, hemisensory loss with dysphagia secondary to left thalamic infarct-Initiate rehab 2.  DVT Prophylaxis/Anticoagulation: Subcutaneous Lovenox. Monitor platelet counts and any signs of bleeding 3. Pain Management: Ultram 50 mg every 6 hours as needed moderate pain due to arthritic knee and hip             -pace as needed, we will initiate Voltaren gel for right wrist 4. Dysphagia. Dysphagia #2 thin liquid diet. Follow-up speech therapy. 5. Neuropsych: This patient is capable of making decisions on his own behalf. 6. Skin/Wound Care: Routine skin checks 7. Fluids/Electrolytes/Nutrition: Routine I&O with follow-up chemistries 8. CAD with stenting/history of PVCs. Low dose Lopressor resumed 12.5 mg twice a day. Follow-up cardiology services. Monitor with increased mobility 9. Diabetes mellitus with peripheral neuropathy. Hemoglobin A1c 5.6. Actos 15 mg daily. Check blood sugars before meals and at bedtime, controlled CBG (last 3)  Recent Labs  06/21/15 1641 06/21/15 2029 06/22/15 0633  GLUCAP 107* 158* 95    10. Hyperlipidemia. Lipitor 11. Renal cyst without other sig pathology, f/u US as outpt  LOS (Days) 1 A FACE TO FACE EVALUATION WAS PERFORMED  , E 06/22/2015, 9:13 AM
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
## 2015-06-22 NOTE — Progress Notes (Signed)
Patient information reviewed and entered into eRehab system by Derek Huneycutt, RN, CRRN, PPS Coordinator.  Information including medical coding and functional independence measure will be reviewed and updated through discharge.     Per nursing patient was given "Data Collection Information Summary for Patients in Inpatient Rehabilitation Facilities with attached "Privacy Act Statement-Health Care Records" upon admission.  

## 2015-06-23 ENCOUNTER — Inpatient Hospital Stay (HOSPITAL_COMMUNITY): Payer: Medicare Other | Admitting: Physical Therapy

## 2015-06-23 ENCOUNTER — Inpatient Hospital Stay (HOSPITAL_COMMUNITY): Payer: Medicare Other | Admitting: Speech Pathology

## 2015-06-23 ENCOUNTER — Inpatient Hospital Stay (HOSPITAL_COMMUNITY): Payer: Medicare Other | Admitting: Occupational Therapy

## 2015-06-23 DIAGNOSIS — R209 Unspecified disturbances of skin sensation: Secondary | ICD-10-CM

## 2015-06-23 DIAGNOSIS — I69393 Ataxia following cerebral infarction: Secondary | ICD-10-CM

## 2015-06-23 DIAGNOSIS — I69398 Other sequelae of cerebral infarction: Secondary | ICD-10-CM

## 2015-06-23 DIAGNOSIS — R269 Unspecified abnormalities of gait and mobility: Secondary | ICD-10-CM

## 2015-06-23 HISTORY — DX: Ataxia following cerebral infarction: I69.393

## 2015-06-23 HISTORY — DX: Other sequelae of cerebral infarction: I69.398

## 2015-06-23 LAB — GLUCOSE, CAPILLARY
Glucose-Capillary: 104 mg/dL — ABNORMAL HIGH (ref 65–99)
Glucose-Capillary: 109 mg/dL — ABNORMAL HIGH (ref 65–99)
Glucose-Capillary: 87 mg/dL (ref 65–99)
Glucose-Capillary: 126 mg/dL — ABNORMAL HIGH (ref 65–99)

## 2015-06-23 NOTE — Progress Notes (Signed)
Subjective/Complaints: No issues overnight. Discussed with OT, does have some sensation in the right upper extremity. Poor sleep due to urinary  frequency took Flomax at home Review of systems negative chest pain negative shortness of breath negative nausea negative vomiting negative diarrhea negative constipation, positive urinary frequency Objective: Vital Signs: Blood pressure 131/68, pulse 70, temperature 98.6 F (37 C), temperature source Oral, resp. rate 18, height _0  (1.702 m), weight 85.5 kg (188 lb 7.9 oz), SpO2 96 %. No results found. Results for orders placed or performed during the hospital encounter of 06/21/15 (from the past 72 hour(s))  Glucose, capillary     Status: Abnormal   Collection Time: 06/21/15  8:29 PM  Result Value Ref Range   Glucose-Capillary 158 (H) 65 - 99 mg/dL   Comment 1 Notify RN   Glucose, capillary     Status: None   Collection Time: 06/22/15  6:33 AM  Result Value Ref Range   Glucose-Capillary 95 65 - 99 mg/dL  CBC WITH DIFFERENTIAL     Status: Abnormal   Collection Time: 06/22/15  8:35 AM  Result Value Ref Range   WBC 9.0 4.0 - 10.5 K/uL   RBC 3.75 (L) 4.22 - 5.81 MIL/uL   Hemoglobin 11.7 (L) 13.0 - 17.0 g/dL   HCT 35.8 (L) 39.0 - 52.0 %   MCV 95.5 78.0 - 100.0 fL   MCH 31.2 26.0 - 34.0 pg   MCHC 32.7 30.0 - 36.0 g/dL   RDW 14.5 11.5 - 15.5 %   Platelets 335 150 - 400 K/uL   Neutrophils Relative % 70 %   Neutro Abs 6.3 1.7 - 7.7 K/uL   Lymphocytes Relative 14 %   Lymphs Abs 1.2 0.7 - 4.0 K/uL   Monocytes Relative 11 %   Monocytes Absolute 1.0 0.1 - 1.0 K/uL   Eosinophils Relative 5 %   Eosinophils Absolute 0.4 0.0 - 0.7 K/uL   Basophils Relative 0 %   Basophils Absolute 0.0 0.0 - 0.1 K/uL  Comprehensive metabolic panel     Status: Abnormal   Collection Time: 06/22/15  8:35 AM  Result Value Ref Range   Sodium 133 (L) 135 - 145 mmol/L   Potassium 3.8 3.5 - 5.1 mmol/L   Chloride 100 (L) 101 - 111 mmol/L   CO2 23 22 - 32 mmol/L    Glucose, Bld 111 (H) 65 - 99 mg/dL   BUN 11 6 - 20 mg/dL   Creatinine, Ser 1.12 0.61 - 1.24 mg/dL   Calcium 9.0 8.9 - 10.3 mg/dL   Total Protein 6.2 (L) 6.5 - 8.1 g/dL   Albumin 2.6 (L) 3.5 - 5.0 g/dL   AST 73 (H) 15 - 41 U/L   ALT 53 17 - 63 U/L   Alkaline Phosphatase 64 38 - 126 U/L   Total Bilirubin 0.5 0.3 - 1.2 mg/dL   GFR calc non Af Amer 58 (L) >60 mL/min   GFR calc Af Amer >60 >60 mL/min    Comment: (NOTE) The eGFR has been calculated using the CKD EPI equation. This calculation has not been validated in all clinical situations. eGFR's persistently <60 mL/min signify possible Chronic Kidney Disease.    Anion gap 10 5 - 15  Glucose, capillary     Status: None   Collection Time: 06/22/15 11:54 AM  Result Value Ref Range   Glucose-Capillary 85 65 - 99 mg/dL   Comment 1 Document in Chart   Glucose, capillary     Status: None  Collection Time: 06/22/15  5:16 PM  Result Value Ref Range   Glucose-Capillary 90 65 - 99 mg/dL  Glucose, capillary     Status: Abnormal   Collection Time: 06/22/15  9:34 PM  Result Value Ref Range   Glucose-Capillary 105 (H) 65 - 99 mg/dL   Comment 1 Notify RN    Comment 2 Document in Chart   Glucose, capillary     Status: Abnormal   Collection Time: 06/23/15  7:09 AM  Result Value Ref Range   Glucose-Capillary 104 (H) 65 - 99 mg/dL     HEENT: normal Cardio: RRR and no murmur Resp: CTA B/L and unlbored GI: BS positive and NT, ND Extremity:  No Edema Skin:   Intact Neuro: Alert/Oriented, Abnormal Sensory absent sensation in the right upper and right lower limb, Normal Motor and Abnormal FMC Ataxic/ dec FMC Musc/Skel:  Other patient has stiffness in the left knee but no evidence of effusion. There is swelling of the right wrist with bony deformity Gen. no acute distress   Assessment/Plan: 1. 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 Physiatrist is providing close team supervision and 24 hour management of active medical problems listed below. Physiatrist and rehab team continue to assess barriers to discharge/monitor patient progress toward functional and medical goals. FIM:    Function- Upper Body Dressing/Undressing What is the patient wearing?: Pull over shirt/dress Pull over shirt/dress - Perfomed by patient: Thread/unthread right sleeve, Thread/unthread left sleeve, Put head through opening, Pull shirt over trunk Assist Level: Touching or steadying assistance(Pt > 75%) Function - Lower Body Dressing/Undressing What is the patient wearing?: Underwear, Pants, Socks, Shoes Position: Sitting EOB Underwear - Performed by patient: Thread/unthread right underwear leg, Thread/unthread left underwear leg Underwear - Performed by helper: Pull underwear up/down Pants- Performed by patient: Thread/unthread right pants leg, Thread/unthread left pants leg Pants- Performed by helper: Pull pants up/down Socks - Performed by patient: Don/doff right sock, Don/doff left sock Shoes - Performed by patient: Don/doff right shoe, Don/doff left shoe Assist for footwear: Setup, Supervision/touching assist Assist for lower body dressing:  (Mod assist for standing balance)  Function - Toileting Toileting activity did not occur: N/A Toileting steps completed by patient: Adjust clothing prior to toileting, Adjust clothing after toileting Toileting steps completed by helper: Adjust clothing prior to toileting, Adjust clothing after toileting Assist level: More than reasonable time (Patient is hard of hearing/chronic back pain)  Function - Air cabin crew transfer activity did not occur: N/A  Function - Chair/bed transfer Chair/bed transfer method: Squat pivot Chair/bed transfer assist level: Maximal assist (Pt 25 - 49%/lift and lower) Chair/bed transfer assistive device: Bedrails,  Armrests Chair/bed transfer details: Visual cues/gestures for sequencing, Verbal cues for sequencing, Verbal cues for technique, Verbal cues for precautions/safety, Tactile cues for placement, Manual facilitation for weight shifting, Manual facilitation for placement  Function - Locomotion: Wheelchair Will patient use wheelchair at discharge?:  (TBD) Max wheelchair distance: 150 Assist Level: Moderate assistance (Pt 50 - 74%) Assist Level: Moderate assistance (Pt 50 - 74%) Assist Level: Moderate assistance (Pt 50 - 74%) Turns around,maneuvers to table,bed, and toilet,negotiates 3% grade,maneuvers on rugs and over doorsills: No Function - Locomotion: Ambulation Assistive device: Rail in hallway Max distance: 25 Assist level: Maximal assist (Pt 25 - 49%) Assist level: Maximal assist (Pt 25 - 49%) Walk 50 feet with 2 turns activity did not occur: Safety/medical  concerns Walk 150 feet activity did not occur: Safety/medical concerns Walk 10 feet on uneven surfaces activity did not occur: Safety/medical concerns  Function - Comprehension Comprehension: Auditory Comprehension assistive device: Hearing aids Comprehension assist level: Follows basic conversation/direction with extra time/assistive device  Function - Expression Expression: Verbal Expression assist level: Expresses basic needs/ideas: With extra time/assistive device  Function - Social Interaction Social Interaction assist level: Interacts appropriately with others with medication or extra time (anti-anxiety, antidepressant).  Function - Problem Solving Problem solving assist level: Solves basic 90% of the time/requires cueing < 10% of the time  Function - Memory Memory assist level: Recognizes or recalls 50 - 74% of the time/requires cueing 25 - 49% of the time Patient normally able to recall (first 3 days only): Current season, Staff names and faces, That he or she is in a hospital  Medical Problem List and Plan: 1.   Right side weakness, hemisensory loss with dysphagia secondary to left thalamic infarct-continue rehab 2.  DVT Prophylaxis/Anticoagulation: Subcutaneous Lovenox. Monitor platelet counts and any signs of bleeding 3. Pain Management: Ultram 50 mg every 6 hours as needed moderate pain due to arthritic knee and hip             -pace as needed, we will initiate Voltaren gel for right wrist 4. Dysphagia. Dysphagia #2 thin liquid diet. Follow-up speech therapy. 5. Neuropsych: This patient is capable of making decisions on his own behalf. 6. Skin/Wound Care: Routine skin checks 7. Fluids/Electrolytes/Nutrition: Routine I&O with follow-up chemistries 8. CAD with stenting/history of PVCs. Low dose Lopressor resumed 12.5 mg twice a day. Follow-up cardiology services. Monitor with increased mobility 9. Diabetes mellitus with peripheral neuropathy. Hemoglobin A1c 5.6. Actos 15 mg daily. Check blood sugars twice a dayCBG (last 3)   Recent Labs  06/22/15 1716 06/22/15 2134 06/23/15 0709  GLUCAP 90 105* 104*    10. Hyperlipidemia. Lipitor 11. Renal cyst without other sig pathology, f/u US as outpt 12. Hypoalbuminemia, start pro-stat      LOS (Days) 2 A FACE TO FACE EVALUATION WAS PERFORMED  KIRSTEINS,ANDREW E 06/23/2015, 8:39 AM

## 2015-06-23 NOTE — Progress Notes (Signed)
Speech Language Pathology Daily Session Note  Patient Details  Name: Herbert Marquez MRN: [id number] Date of Birth: [birth year]  Today's Date: 06/23/2015 SLP Individual Time: 1000-1100 SLP Individual Time Calculation (min): 60 min  Short Term Goals: Week 1: SLP Short Term Goal 1 (Week 1): Pt to demonstrate money counting/change making at mod I level. SLP Short Term Goal 2 (Week 1): Pt to demonstrate reading comprehension at the functional level at mod I. SLP Short Term Goal 3 (Week 1): Pt to demonstrate recall of new information with min A. SLP Short Term Goal 4 (Week 1): Pt to manage Dys 3/regular consistency snacks at mod I to demonstrate readiness for diet upgrade. SLP Short Term Goal 5 (Week 1): Pt demonstrate completion of medication management task with min A.  Skilled Therapeutic Interventions: Pt seen for skilled SLP therapy with focus on cognitive-linguistic goals. Subtests of the ALFA were used to facilitate therapy. $ counting 9/10 mod I, increased to 100% with min A to identify error. Daily math 10/10 at mod I for increased time. Medicine label understanding 5/10 with mod A to complete exercise. Filling out a pill box appeared to be challenging. At home, pt uses am/pm box- will practice further with this set up. Moderate level reasoning task of falling written directives completed with mod A to direct attention to pertinent details and faciliate follow-through.    Function:  Eating Eating                 Cognition Comprehension Comprehension assist level: Follows basic conversation/direction with extra time/assistive device  Expression   Expression assist level: Expresses basic needs/ideas: With extra time/assistive device  Social Interaction Social Interaction assist level: Interacts appropriately with others with medication or extra time (anti-anxiety, antidepressant).  Problem Solving Problem solving assist level: Solves basic 75 - 89% of the time/requires cueing 10 -  24% of the time  Memory Memory assist level: Recognizes or recalls 75 - 89% of the time/requires cueing 10 - 24% of the time    Pain Pain Assessment Pain Assessment: No/denies pain  Therapy/Group: Individual Therapy  Vinetta Bergamo 06/23/2015, 11:50 AM

## 2015-06-23 NOTE — Progress Notes (Signed)
Physical Therapy Session Note  Patient Details  Name: Herbert Marquez MRN: [id number] Date of Birth: 06/13/1927  Today's Date: 06/23/2015 PT Individual Time: [id number] and 1100-1200 PT Individual Time Calculation (min): 45 min and 60 min (total 105 min)   Short Term Goals: Week 1:  PT Short Term Goal 1 (Week 1): Pt will perform squat pivot transfer consistent minA PT Short Term Goal 2 (Week 1): Pt will perform w/c propulsion with L hemi technique minA PT Short Term Goal 3 (Week 1): Pt will perform gait x25' with LRAD and modA PT Short Term Goal 4 (Week 1): Pt will perform ascent/descent stairs with consistent modA  Skilled Therapeutic Interventions/Progress Updates:    Tx 1: Pt received seated in recliner; denies pain and agreeable to treatment. Squat pivot transfer recliner>w/c with maxA. Transfers x3 during session with stand pivot technique to L side, with therapist under RUE. Gait 2x25' in hall with L rail and mod/maxA with occasional R knee buckling. Sitting RUE reaching for horseshoes and throwing to target; performed for RUE coordination, sitting balance. Supine LE exercises straight leg raise, bridging, bridging with ball adduction, hip IR/ER; performed for coordination and cues for smooth, slow movements. Sit <>stand x5 reps from edge of mat table; maxA initially, improved to min/modA for remaining trials. Returned to room totalA and transferred stand pivot to recliner as described above. Remained seated in recliner at completion of session, all needs within reach.   Tx 2: Pt received seated in recliner, denies pain and agreeable to treatment. Stand pivot transfer x2 during session recliner>w/c and w/c >bed with modA, verbal cues for sequencing and hand placement. Sit <>stand and standing balance in modified plantigrade on mat table; height adjusted throughout session to appropriately challenge pt and encourage forward weight shift. Required blocking/facilitation at R knee due to flexion  bias and knee pain with buckling. In modified plantigrade, performed alternating UE reaching to retrieve clothespins and place on tree; increased time and trials for RUE due to ataxia. Pt fatigues easily and requires rest breaks after short bouts of standing. Attempted w/c propulsion with max cues and hand-over-hand for RUE on wheel however severely impaired awareness, sensation and proprioception even with maxA and activity discontinued due to safety concerns. Returned to room in w/c with pt propelling LUE, therapist A with steering. Transferred to bed as above. Remained supine in bed at completion of session, all needs within reach.   Therapy Documentation Precautions:  Precautions Precautions: Fall Precaution Comments: right side weakness, impaired coordination/awareness RUE during transfers is injury risk Restrictions Weight Bearing Restrictions: No Pain: Pain Assessment Pain Assessment: No/denies pain Pain Score: 0-No pain   See Function Navigator for Current Functional Status.   Therapy/Group: Individual Therapy  Luberta Mutter 06/23/2015, 2:54 PM

## 2015-06-23 NOTE — Progress Notes (Signed)
Occupational Therapy Session Note  Patient Details  Name: Herbert Marquez MRN: [id number] Date of Birth: 08-May-1927  Today's Date: 06/23/2015 OT Individual Time: 0800-0900 OT Individual Time Calculation (min): 60 min    Short Term Goals: Week 1:  OT Short Term Goal 1 (Week 1): Pt will complete bathing with min assist OT Short Term Goal 2 (Week 1): Pt will complete LB dressing with min assist for standing balance OT Short Term Goal 3 (Week 1): Pt will complete toilet transfer with min assist OT Short Term Goal 4 (Week 1): Pt will complete 1 grooming task in standing with min assist for standing balance OT Short Term Goal 5 (Week 1): Pt will visually attend to RUE during grooming tasks with min cues to increase safety due to impaired sensation and proprioception  Skilled Therapeutic Interventions/Progress Updates:    Treatment session with focus on functional mobility, transfers, and functional use of RUE during self-care tasks.  Performed squat pivot bed to w/c with mod assist, attempted to utilize sling for positioning to minimize injury to RUE however more problematic than hand over hand to maintain Rt hand placement on arm rest during transfer.  Squat pivot transfer to Rt into room shower with mod assist and use of grab bars.  Mod verbal cues throughout shower to visually attend to RUE as it would fall off lap or get stuck in grab bar on wall due to impaired proprioception and sensation.  Mod assist sit > stand with therapist facilitating weight bearing and stability through RLE.  Stand pivot to Lt when exiting shower with mod assist and increased motor control.  Dressing completed at sit > stand level, therapist educating on hemi-dressing technique and visually attending to RUE during use.  Mod assist and blocking at Rt knee sit > stand to pull up pants with therapist assisting with Rt side of pants.  Oral care completed in sitting at sink with use of RUE to assist in setup of items, but brushing  with Lt hand.  Therapeutic activity with focus on RUE motor control and visual attention with grasping and removing cards from vertical surface, min cues for technique.  Returned to room and completed stand pivot transfer to recliner.  Therapy Documentation Precautions:  Precautions Precautions: Fall Precaution Comments: right side weakness, impaired coordination/awareness RUE during transfers is injury risk Restrictions Weight Bearing Restrictions: No General:   Vital Signs: Therapy Vitals Pulse Rate: 75 BP: 118/78 mmHg Pain:  Pt with no c/o pain  See Function Navigator for Current Functional Status.   Therapy/Group: Individual Therapy  [name] 06/23/2015, 9:37 AM

## 2015-06-24 ENCOUNTER — Inpatient Hospital Stay (HOSPITAL_COMMUNITY): Payer: Medicare Other | Admitting: Occupational Therapy

## 2015-06-24 ENCOUNTER — Inpatient Hospital Stay (HOSPITAL_COMMUNITY): Payer: Medicare Other | Admitting: Physical Therapy

## 2015-06-24 ENCOUNTER — Inpatient Hospital Stay (HOSPITAL_COMMUNITY): Payer: Medicare Other | Admitting: Speech Pathology

## 2015-06-24 DIAGNOSIS — R269 Unspecified abnormalities of gait and mobility: Secondary | ICD-10-CM

## 2015-06-24 DIAGNOSIS — I69398 Other sequelae of cerebral infarction: Secondary | ICD-10-CM

## 2015-06-24 DIAGNOSIS — I69898 Other sequelae of other cerebrovascular disease: Secondary | ICD-10-CM

## 2015-06-24 DIAGNOSIS — I69991 Dysphagia following unspecified cerebrovascular disease: Secondary | ICD-10-CM

## 2015-06-24 DIAGNOSIS — R208 Other disturbances of skin sensation: Secondary | ICD-10-CM

## 2015-06-24 LAB — CULTURE, BLOOD (ROUTINE X 2)
Culture: NO GROWTH
Culture: NO GROWTH

## 2015-06-24 LAB — GLUCOSE, CAPILLARY
Glucose-Capillary: 95 mg/dL (ref 65–99)
Glucose-Capillary: 101 mg/dL — ABNORMAL HIGH (ref 65–99)
Glucose-Capillary: 78 mg/dL (ref 65–99)
Glucose-Capillary: 131 mg/dL — ABNORMAL HIGH (ref 65–99)

## 2015-06-24 MED ORDER — SENNOSIDES-DOCUSATE SODIUM 8.6-50 MG PO TABS
2.0000 | ORAL_TABLET | Freq: Every day | ORAL | Status: DC
  Administered 2015-06-24 – 2015-07-06 (×5): 2 via ORAL
  Filled 2015-06-24 (×14): qty 2

## 2015-06-24 NOTE — Progress Notes (Signed)
Physical Therapy Session Note  Patient Details  Name: Herbert Marquez MRN: [id number] Date of Birth: 1927/06/01  Today's Date: 06/24/2015 PT Individual Time: 1300-1400 PT Individual Time Calculation (min): 60 min   Short Term Goals: Week 1:  PT Short Term Goal 1 (Week 1): Pt will perform squat pivot transfer consistent minA PT Short Term Goal 2 (Week 1): Pt will perform w/c propulsion with L hemi technique minA PT Short Term Goal 3 (Week 1): Pt will perform gait x25' with LRAD and modA PT Short Term Goal 4 (Week 1): Pt will perform ascent/descent stairs with consistent modA  Skilled Therapeutic Interventions/Progress Updates:    Pt received seated in bed, denies pain and agreeable to treatment. Supine>sit with HOB elevated, bedrails and increased time. Stand pivot transfer to R side maxA due to RUE reaching for sink, not coming to complete stand. Gait x25' and x15'; mod/maxA due to R knee buckling, incoordination and fatigue. Sit <>stand from edge of mat table with dynamic UE reaching; requires mod/maxA for standing balance due to R knee pain, genu valgus and incoordination requiring max tactile cues/facilitation for knee/hip extension. Sit <>stand with no UE support and modA from therapist to standing balance in front of mirror for visual feedback. Squat pivot x4 throughout session with min/modA. Nustep x6 min with BUE/BLE, abduction assist at R knee to prevent valgus and strengthen in optimal alignment. Returned to room totalA for energy conservation; agreeable to remain seated in w/c for next OT session in 15 min.   Therapy Documentation Precautions:  Precautions Precautions: Fall Precaution Comments: right side weakness, impaired coordination/awareness RUE during transfers is injury risk Restrictions Weight Bearing Restrictions: No Pain: Pain Assessment Pain Assessment: No/denies pain   See Function Navigator for Current Functional Status.   Therapy/Group: Individual  Therapy  Luberta Mutter 06/24/2015, 3:57 PM

## 2015-06-24 NOTE — Progress Notes (Signed)
Subjective/Complaints: Patient has no complaints today however his wife states that he has had no bowel movement in several days. No other issues overnight.                                                                                                                                                               Review of systems negative chest pain negative shortness of breath negative nausea negative vomiting negative diarrhea ,+ constipation, positive urinary frequency Objective: Vital Signs: Blood pressure 134/77, pulse 76, temperature 99.3 F (37.4 C), temperature source Oral, resp. rate 18, height _0  (1.702 m), weight 82.7 kg (182 lb 5.1 oz), SpO2 93 %. No results found. Results for orders placed or performed during the hospital encounter of 06/21/15 (from the past 72 hour(s))  Glucose, capillary     Status: Abnormal   Collection Time: 06/21/15  8:29 PM  Result Value Ref Range   Glucose-Capillary 158 (H) 65 - 99 mg/dL   Comment 1 Notify RN   Glucose, capillary     Status: None   Collection Time: 06/22/15  6:33 AM  Result Value Ref Range   Glucose-Capillary 95 65 - 99 mg/dL  CBC WITH DIFFERENTIAL     Status: Abnormal   Collection Time: 06/22/15  8:35 AM  Result Value Ref Range   WBC 9.0 4.0 - 10.5 K/uL   RBC 3.75 (L) 4.22 - 5.81 MIL/uL   Hemoglobin 11.7 (L) 13.0 - 17.0 g/dL   HCT 35.8 (L) 39.0 - 52.0 %   MCV 95.5 78.0 - 100.0 fL   MCH 31.2 26.0 - 34.0 pg   MCHC 32.7 30.0 - 36.0 g/dL   RDW 14.5 11.5 - 15.5 %   Platelets 335 150 - 400 K/uL   Neutrophils Relative % 70 %   Neutro Abs 6.3 1.7 - 7.7 K/uL   Lymphocytes Relative 14 %   Lymphs Abs 1.2 0.7 - 4.0 K/uL   Monocytes Relative 11 %   Monocytes Absolute 1.0 0.1 - 1.0 K/uL   Eosinophils Relative 5 %   Eosinophils Absolute 0.4 0.0 - 0.7 K/uL   Basophils Relative 0 %   Basophils Absolute 0.0 0.0 - 0.1 K/uL  Comprehensive metabolic panel     Status: Abnormal   Collection Time: 06/22/15  8:35 AM  Result Value Ref Range    Sodium 133 (L) 135 - 145 mmol/L   Potassium 3.8 3.5 - 5.1 mmol/L   Chloride 100 (L) 101 - 111 mmol/L   CO2 23 22 - 32 mmol/L   Glucose, Bld 111 (H) 65 - 99 mg/dL   BUN 11 6 - 20 mg/dL   Creatinine, Ser 1.12 0.61 - 1.24 mg/dL   Calcium 9.0 8.9 - 10.3 mg/dL   Total Protein 6.2 (L) 6.5 -  8.1 g/dL   Albumin 2.6 (L) 3.5 - 5.0 g/dL   AST 73 (H) 15 - 41 U/L   ALT 53 17 - 63 U/L   Alkaline Phosphatase 64 38 - 126 U/L   Total Bilirubin 0.5 0.3 - 1.2 mg/dL   GFR calc non Af Amer 58 (L) >60 mL/min   GFR calc Af Amer >60 >60 mL/min    Comment: (NOTE) The eGFR has been calculated using the CKD EPI equation. This calculation has not been validated in all clinical situations. eGFR's persistently <60 mL/min signify possible Chronic Kidney Disease.    Anion gap 10 5 - 15  Glucose, capillary     Status: None   Collection Time: 06/22/15 11:54 AM  Result Value Ref Range   Glucose-Capillary 85 65 - 99 mg/dL   Comment 1 Document in Chart   Glucose, capillary     Status: None   Collection Time: 06/22/15  5:16 PM  Result Value Ref Range   Glucose-Capillary 90 65 - 99 mg/dL  Glucose, capillary     Status: Abnormal   Collection Time: 06/22/15  9:34 PM  Result Value Ref Range   Glucose-Capillary 105 (H) 65 - 99 mg/dL   Comment 1 Notify RN    Comment 2 Document in Chart   Glucose, capillary     Status: Abnormal   Collection Time: 06/23/15  7:09 AM  Result Value Ref Range   Glucose-Capillary 104 (H) 65 - 99 mg/dL  Glucose, capillary     Status: Abnormal   Collection Time: 06/23/15 12:06 PM  Result Value Ref Range   Glucose-Capillary 109 (H) 65 - 99 mg/dL  Glucose, capillary     Status: None   Collection Time: 06/23/15  4:57 PM  Result Value Ref Range   Glucose-Capillary 87 65 - 99 mg/dL  Glucose, capillary     Status: Abnormal   Collection Time: 06/23/15  8:37 PM  Result Value Ref Range   Glucose-Capillary 126 (H) 65 - 99 mg/dL   Comment 1 Notify RN   Glucose, capillary     Status: None    Collection Time: 06/24/15  6:36 AM  Result Value Ref Range   Glucose-Capillary 95 65 - 99 mg/dL   Comment 1 Notify RN      HEENT: normal Cardio: RRR and no murmur Resp: CTA B/L and unlbored GI: BS positive and NT, ND Extremity:  No Edema Skin:   Intact Neuro: Alert/Oriented, Abnormal Sensory absent sensation in the right upper and right lower limb, Normal Motor and Abnormal FMC Ataxic/ dec FMC Musc/Skel:  Other patient has stiffness in the left knee but no evidence of effusion. There is swelling of the right wrist with bony deformity Gen. no acute distress   Assessment/Plan: 1. 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 Physiatrist is providing close team supervision and 24 hour management of active medical problems listed below. Physiatrist and rehab team continue to assess barriers to discharge/monitor patient progress toward functional and medical goals. FIM: Function - Bathing Position: Shower Body parts bathed by patient: Right arm, Left arm, Chest, Abdomen, Front perineal area, Right upper leg, Left upper leg, Right lower leg, Left lower leg Body parts bathed by helper: Back, Buttocks Assist Level: Supervision or verbal cues, Touching or steadying assistance(Pt > 75%) (Mod assist for standing)  Function- Upper Body Dressing/Undressing What is the patient wearing?: Pull over  shirt/dress Pull over shirt/dress - Perfomed by patient: Thread/unthread left sleeve, Put head through opening, Pull shirt over trunk, Thread/unthread right sleeve Pull over shirt/dress - Perfomed by helper: Thread/unthread right sleeve Assist Level: Set up Set up : To obtain clothing/put away Function - Lower Body Dressing/Undressing What is the patient wearing?: Pants, Shoes, Ted Hose Position: Wheelchair/chair at [employer] - Performed by patient:  Thread/unthread right underwear leg, Thread/unthread left underwear leg Underwear - Performed by helper: Pull underwear up/down Pants- Performed by patient: Thread/unthread right pants leg, Thread/unthread left pants leg, Pull pants up/down Pants- Performed by helper: Thread/unthread left pants leg, Pull pants up/down Socks - Performed by patient: Don/doff right sock, Don/doff left sock Shoes - Performed by patient: Don/doff right shoe, Don/doff left shoe TED Hose - Performed by helper: Don/doff right TED hose, Don/doff left TED hose Assist for footwear: Setup, Supervision/touching assist Assist for lower body dressing:  (Mod assist for standing balance)  Function - Toileting Toileting activity did not occur: N/A Toileting steps completed by patient: Adjust clothing prior to toileting, Adjust clothing after toileting Toileting steps completed by helper: Adjust clothing prior to toileting, Adjust clothing after toileting Assist level: More than reasonable time (Patient is hard of hearing/chronic back pain)  Function - Air cabin crew transfer activity did not occur:  (no BM since admission and using urinal)  Function - Chair/bed transfer Chair/bed transfer method: Squat pivot Chair/bed transfer assist level: Moderate assist (Pt 50 - 74%/lift or lower) Chair/bed transfer assistive device: Armrests Chair/bed transfer details: Visual cues/gestures for sequencing, Verbal cues for sequencing, Verbal cues for technique, Verbal cues for precautions/safety, Tactile cues for placement, Manual facilitation for weight shifting, Manual facilitation for placement  Function - Locomotion: Wheelchair Will patient use wheelchair at discharge?:  (TBD) Max wheelchair distance: 150 Assist Level: Moderate assistance (Pt 50 - 74%) Assist Level: Moderate assistance (Pt 50 - 74%) Assist Level: Moderate assistance (Pt 50 - 74%) Turns around,maneuvers to table,bed, and toilet,negotiates 3%  grade,maneuvers on rugs and over doorsills: No Function - Locomotion: Ambulation Assistive device: Rail in hallway Max distance: 25 Assist level: Maximal assist (Pt 25 - 49%) Assist level: Maximal assist (Pt 25 - 49%) Walk 50 feet with 2 turns activity did not occur: Safety/medical concerns Walk 150 feet activity did not occur: Safety/medical concerns Walk 10 feet on uneven surfaces activity did not occur: Safety/medical concerns  Function - Comprehension Comprehension: Auditory Comprehension assistive device: Hearing aids Comprehension assist level: Follows basic conversation/direction with extra time/assistive device  Function - Expression Expression: Verbal Expression assist level: Expresses basic needs/ideas: With extra time/assistive device  Function - Social Interaction Social Interaction assist level: Interacts appropriately with others with medication or extra time (anti-anxiety, antidepressant).  Function - Problem Solving Problem solving assist level: Solves basic 90% of the time/requires cueing < 10% of the time  Function - Memory Memory assist level: Recognizes or recalls 50 - 74% of the time/requires cueing 25 - 49% of the time Patient normally able to recall (first 3 days only): Current season, Staff names and faces, That he or she is in a hospital  Medical Problem List and Plan: 1.  Right side weakness, hemisensory loss with dysphagia secondary to left thalamic infarct-sensory ataxia lower greater than upper limb right side 2.  DVT Prophylaxis/Anticoagulation: Subcutaneous Lovenox. Monitor platelet counts and any signs of bleeding 3. Pain Management: Ultram 50 mg every 6 hours as needed moderate pain due to arthritic knee and hip             -  pace as needed, we will initiate Voltaren gel for right wrist 4. Dysphagia. Dysphagia #2 thin liquid diet. Follow-up speech therapy.intake 100% of meals 5. Neuropsych: This patient is capable of making decisions on his own  behalf. 6. Skin/Wound Care: Routine skin checks 7. Fluids/Electrolytes/Nutrition: Routine I&O with follow-up chemistries 8. CAD with stenting/history of PVCs. Low dose Lopressor resumed 12.5 mg twice a day. Follow-up cardiology services. Monitor with increased mobility 9. Diabetes mellitus with peripheral neuropathy. Hemoglobin A1c 5.6. Actos 15 mg daily. Check blood sugars twice a dayCBG (last 3)   Recent Labs  06/23/15 1657 06/23/15 2037 06/24/15 0636  GLUCAP 87 126* 95    10. Hyperlipidemia. Lipitor 11. Renal cyst without other sig pathology, f/u US as outpt 12. Hypoalbuminemia, start pro-stat   13. Constipation we'll adjust bowel program     LOS (Days) 3 A FACE TO [place] E 06/24/2015, 8:22 AM

## 2015-06-24 NOTE — IPOC Note (Signed)
Overall Plan of Care [facility]) Patient Details Name: Herbert Marquez MRN: [id number] DOB: [birth year]  Admitting Diagnosis: CVA  Hospital Problems: Principal Problem:   Ataxia due to recent stroke Active Problems:   Dysphagia as late effect of cerebrovascular disease   Thalamic infarction ([place])   Gait disturbance, post-stroke   Alterations of sensations following CVA (cerebrovascular accident)     Functional Problem List: Nursing Behavior, Bladder, Medication Management, Endurance, Motor, Nutrition, Perception, Safety, Skin Integrity, Sensory  PT Balance, Safety, Sensory, Behavior, Perception, Motor, Endurance  OT Balance, Endurance, Motor, Perception, Safety, Sensory, Skin Integrity  SLP Cognition, Nutrition  TR         Basic ADL's: OT Eating, Grooming, Bathing, Dressing, Toileting     Advanced  ADL's: OT       Transfers: PT Bed Mobility, Bed to Chair, Car, Manufacturing systems engineer, Metallurgist: PT Ambulation, Emergency planning/management officer, Stairs     Additional Impairments: OT Fuctional Use of Upper Extremity  SLP Social Cognition, Swallowing   Problem Solving, Memory  TR      Anticipated Outcomes Item Anticipated Outcome  Self Feeding Supervision/setup  Swallowing  mod I with least restrictive diet   Basic self-care  Supervision/setup  Toileting  Supervision/setup   Bathroom Transfers Supervision/setup  Bowel/Bladder  Pt will manage bowel and bladder with min assist   Transfers  S  Locomotion  S w/c, household ambulation with LRAD  Communication     Cognition  mod I for basic, supervision- min A for complex  Pain  Pt will manage pain at 4 or less on a scale of 0-10.   Safety/Judgment  Pt will remain free of falls and injury with min assist while in rehab   Therapy Plan: PT Intensity: Minimum of 1-2 x/day ,45 to 90 minutes PT Frequency: 5 out of 7 days PT Duration Estimated Length of Stay: 16-18 days OT Intensity: Minimum of 1-2 x/day, 45 to 90  minutes OT Frequency: 5 out of 7 days OT Duration/Estimated Length of Stay: 16-18 days SLP Intensity: Minumum of 1-2 x/day, 30 to 90 minutes SLP Frequency: 3 to 5 out of 7 days SLP Duration/Estimated Length of Stay: 10-14 days       Team Interventions: Nursing Interventions Patient/Family Education, Bladder Management, Disease Management/Prevention, Medication Management, Skin Care/Wound Management, Dysphagia/Aspiration Precaution Training, Cognitive Remediation/Compensation, Discharge Planning  PT interventions Ambulation/gait training, Training and development officer, Community reintegration, Cognitive remediation/compensation, Discharge planning, Disease management/prevention, DME/adaptive equipment instruction, Functional mobility training, Neuromuscular re-education, Patient/family education, Pain management, Stair training, Therapeutic Activities, Therapeutic Exercise, UE/LE Strength taining/ROM, UE/LE Coordination activities, Visual/perceptual remediation/compensation, Wheelchair propulsion/positioning  OT Interventions Training and development officer, Academic librarian, Discharge planning, Disease mangement/prevention, Engineer, drilling, Functional mobility training, Neuromuscular re-education, Pain management, Patient/family education, Psychosocial support, Self Care/advanced ADL retraining, Skin care/wound managment, Splinting/orthotics, Therapeutic Activities, Therapeutic Exercise, UE/LE Strength taining/ROM, UE/LE Coordination activities, Wheelchair propulsion/positioning  SLP Interventions Cognitive remediation/compensation, Dysphagia/aspiration precaution training, Patient/family education, Medication managment, Functional tasks, Therapeutic Activities, Multimodal communication approach, Cueing hierarchy  TR Interventions    SW/CM Interventions Discharge Planning, Psychosocial Support, Patient/Family Education    Team Discharge Planning: Destination: PT-Home ,OT- Home ,  SLP-Home Projected Follow-up: PT-Home health PT, OT-  Home health OT, Outpatient OT (TBD), SLP-None (pending progress) Projected Equipment Needs: PT-To be determined, OT- Tub/shower seat, To be determined, SLP-None recommended by SLP Equipment Details: PT- , OT-  Patient/family involved in discharge planning: PT- Patient, Family member/caregiver,  OT-Family member/caregiver, Patient, SLP-Patient  MD ELOS: 14-17d Medical Rehab  Prognosis:  Good Assessment: 82 y.o. right handed male with history of hypertension,Tachycardia with PVCs, type 2 diabetes mellitus, coronary artery disease with stenting. Patient lives with spouse, who is only able to provide supervision at discharge. Independent with a cane prior to admission due to knee and hip pain. One level home with 2 steps to entry. Presented 06/16/2015 with syncope, dizziness. No seizure activity noted. Initially in the ED workup unremarkable. Patient subsequently developed right sided weakness and facial droop in the ED with a blank stare and was unresponsive for at least 1 minute. Cranial CT scan negative. Patient did not receive TPA. Troponin negative. MRI of the brain showed acute 8 x 12 mm left thalamic infarct. Chronic small vessel disease. 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 High-grade stenosis right P2 segment. MRA of neck with no high-grade stenosis. CTA of neck probable moderate to high-grade stenosis left internal carotid artery origin, limited by motion. Less than 50% stenosis right internal carotid artery. CTA of head occluded right V4 segment. Occluded left posterior cerebral artery at origin. Occluded proximal right P3 segment. EEG consistent with mild generalized nonspecific cerebral dysfunction/encephalopathy. No seizure activity. Echocardiogram ejection fraction 60% no wall motion abnormalities. Grade 1 diastolic dysfunction. Neurology consulted maintain on aspirin and Plavix for CVA prophylaxis. Subcutaneous Lovenox for  DVT prophylaxis. Dysphagia #2 thin liquid diet. On 06/19/2015 patient with low-grade fever and some nonspecific tachycardiaIn the 120s. 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 Cardiology services follow-up low-dose beta blocker with metoprolol added and monitor closely for hypotension. Sepsis protocol initiated. Lactic acid 1.9.    Now requiring 24/7 Rehab RN,MD, as well as CIR level PT, OT and SLP.  Treatment team will focus on ADLs and mobility with goals set at Supervision  See Team Conference Notes for weekly updates to the plan of care

## 2015-06-24 NOTE — Progress Notes (Signed)
Occupational Therapy Session Note  Patient Details  Name: Herbert Marquez MRN: [id number] Date of Birth: 20-Jan-1928  Today's Date: 06/24/2015 OT Individual Time: [id number] and 1420-1505 OT Individual Time Calculation (min): 60 min and 45 min   Short Term Goals: Week 1:  OT Short Term Goal 1 (Week 1): Pt will complete bathing with min assist OT Short Term Goal 2 (Week 1): Pt will complete LB dressing with min assist for standing balance OT Short Term Goal 3 (Week 1): Pt will complete toilet transfer with min assist OT Short Term Goal 4 (Week 1): Pt will complete 1 grooming task in standing with min assist for standing balance OT Short Term Goal 5 (Week 1): Pt will visually attend to RUE during grooming tasks with min cues to increase safety due to impaired sensation and proprioception  Skilled Therapeutic Interventions/Progress Updates:    1) ADL retraining with focus on functional transfers, sit > stand, and functional use of dominant RUE.  Pt in bed upon arrival, completed bed mobility with supervision.  Squat pivot transfer to Rt to w/c, noted improved motor control with RUE when reaching for arm rest.  Hand over hand to maintain positioning of Rt hand on arm rest during transfer and manual facilitation for weight shift.  Dressing completed at sit > stand level at sink with focus on anterior weight shift for sit > stand and therapist blocking Rt knee in standing while pt pulled pants over hips using Lt arm only.  Engaged in self-feeding with focus on motor control and visual attention to RUE, pt eating entire meal with Rt hand only, occasional assist from Lt to position RUE onto tray.  Utilized Lt hand for coffee as pt reports "not trusting" his Rt hand with hot coffee. Engaged in [place] with focus on maintaining UE grasp on handle with 5 mins forward and 3 mins backward.  Pt maintaining grasp on handle for first 2 mins but then with multiple drops as conversation with therapist increased, therefore  dividing attention from task.  Educated on visually attending to RUE to increase grasp and success with movement.  2)Treatment session with focus on NMR and functional use of dominant RUE.  Engaged in reaching activity with RUE with focus on motor control and precision with reaching, encouraged pt to visually attend to RUE while reaching with intermittent improvements.  Progressed to stacking cups with Rt hand to increase challenge with motor control and precision.  Increased challenge to incorporating sit > stand and standing balance during UE task.  Utilized 1/4# wrist weight to improve motor control and decrease ataxia.  Mod-min assist sit > stand with tactile cues to promote anterior weight shift with RUE placed on table top for leverage and increased positioning.  Therapist blocked Rt knee in standing due to premorbid weakness and instability.  Engaged in above activity without weight on wrist while standing.  Reaching activity in standing with obtaining 12 numbered discs on table with focus on weight shifting and direction changes with RUE while therapist blocked knee and provided tactile cues for upright standing.  Pt tolerate standing 2-3 mins at a time.  Returned to bed mod assist via squat pivot at end of session and left semi-reclined in bed.  Therapy Documentation Precautions:  Precautions Precautions: Fall Precaution Comments: right side weakness, impaired coordination/awareness RUE during transfers is injury risk Restrictions Weight Bearing Restrictions: No General:   Vital Signs: Therapy Vitals Temp: 99.3 F (37.4 C) Temp Source: Oral Pulse Rate: 76 Resp: 18  BP: 134/77 mmHg Patient Position (if appropriate): Lying Oxygen Therapy SpO2: 93 % O2 Device: Not Delivered Pain:  Pt with no c/o pain  See Function Navigator for Current Functional Status.   Therapy/Group: Individual Therapy  [name] 06/24/2015, 8:05 AM

## 2015-06-24 NOTE — Progress Notes (Signed)
Speech Language Pathology Daily Session Note  Patient Details  Name: Herbert Marquez MRN: [id number] Date of Birth: 1927/11/12  Today's Date: 06/24/2015 SLP Individual Time: 1135-1200 SLP Individual Time Calculation (min): 25 min  Short Term Goals: Week 1: SLP Short Term Goal 1 (Week 1): Pt to demonstrate money counting/change making at mod I level. SLP Short Term Goal 2 (Week 1): Pt to demonstrate reading comprehension at the functional level at mod I. SLP Short Term Goal 3 (Week 1): Pt to demonstrate recall of new information with min A. SLP Short Term Goal 4 (Week 1): Pt to manage Dys 3/regular consistency snacks at mod I to demonstrate readiness for diet upgrade. SLP Short Term Goal 5 (Week 1): Pt demonstrate completion of medication management task with min A.  Skilled Therapeutic Interventions:  Pt was seen for skilled ST targeting dysphagia goals.  SLP facilitated the session with trials of regular textures to continue working towards diet advancement.  Pt consumed graham crackers and peanut butter with mod I use of swallowing precautions.  Pt's mastication was slightly slowed but effective and pt was able to clear solids from the oral cavity without difficulty.  No overt s/s of aspiration evident with solids or liquids.  Recommend advancement to dys 3 textures and thin liquids; meds whole in puree.  Pt aware of and in agreement with recommended plan of care.  Prognosis for advancement excellent with ongoing trials of regular textures, specifically during a meal.   Continue per current plan of care.    Function:  Eating Eating   Modified Consistency Diet: Yes Eating Assist Level: Set up assist for;Supervision or verbal cues   Eating Set Up Assist For: Opening containers       Cognition Comprehension Comprehension assist level: Follows complex conversation/direction with extra time/assistive device  Expression   Expression assist level: Expresses complex ideas: With extra  time/assistive device  Social Interaction Social Interaction assist level: Interacts appropriately with others with medication or extra time (anti-anxiety, antidepressant).  Problem Solving Problem solving assist level: Solves basic 90% of the time/requires cueing < 10% of the time  Memory Memory assist level: Recognizes or recalls 75 - 89% of the time/requires cueing 10 - 24% of the time    Pain Pain Assessment Pain Assessment: No/denies pain Pain Score: 0-No pain  Therapy/Group: Individual Therapy  Donatella Walski, Selinda Orion 06/24/2015, 12:24 PM

## 2015-06-25 ENCOUNTER — Inpatient Hospital Stay (HOSPITAL_COMMUNITY): Payer: Medicare Other | Admitting: Speech Pathology

## 2015-06-25 LAB — GLUCOSE, CAPILLARY
Glucose-Capillary: 105 mg/dL — ABNORMAL HIGH (ref 65–99)
Glucose-Capillary: 103 mg/dL — ABNORMAL HIGH (ref 65–99)
Glucose-Capillary: 136 mg/dL — ABNORMAL HIGH (ref 65–99)
Glucose-Capillary: 112 mg/dL — ABNORMAL HIGH (ref 65–99)

## 2015-06-25 NOTE — Progress Notes (Signed)
Speech Language Pathology Daily Session Note  Patient Details  Name: Herbert Marquez MRN: [id number] Date of Birth: 1927-10-14  Today's Date: 06/25/2015 SLP Individual Time: 1030-1100 SLP Individual Time Calculation (min): 30 min  Short Term Goals: Week 1: SLP Short Term Goal 1 (Week 1): Pt to demonstrate money counting/change making at mod I level. SLP Short Term Goal 2 (Week 1): Pt to demonstrate reading comprehension at the functional level at mod I. SLP Short Term Goal 3 (Week 1): Pt to demonstrate recall of new information with min A. SLP Short Term Goal 4 (Week 1): Pt to manage Dys 3/regular consistency snacks at mod I to demonstrate readiness for diet upgrade. SLP Short Term Goal 5 (Week 1): Pt demonstrate completion of medication management task with min A.  Skilled Therapeutic Interventions: Skilled treatment session focused on dysphagia goals. SLP facilitated session by providing skilled observation with snack of Dys. 3 textures with thin liquids. Patient demonstrated efficient mastication with complete oral clearance with independent use of a liquid wash without overt s/s of aspiration and required supervision verbal cues for use of small bites. Recommend patient continue his current diet of Dys. 3 textures with thin liquids.  Patient left upright in wheelchair with wife present. Continue with current plan of care.     Function:  Eating Eating Eating activity did not occur: N/A Modified Consistency Diet: Yes Eating Assist Level: Set up assist for;Supervision or verbal cues   Eating Set Up Assist For: Opening containers       Cognition Comprehension Comprehension assist level: Follows complex conversation/direction with extra time/assistive device  Expression   Expression assist level: Expresses complex ideas: With extra time/assistive device  Social Interaction Social Interaction assist level: Interacts appropriately with others with medication or extra time (anti-anxiety,  antidepressant).  Problem Solving Problem solving assist level: Solves basic 90% of the time/requires cueing < 10% of the time  Memory Memory assist level: Recognizes or recalls 75 - 89% of the time/requires cueing 10 - 24% of the time    Pain Pain Assessment Pain Assessment: No/denies pain  Therapy/Group: Individual Therapy  Stephanos Fan 06/25/2015, 12:14 PM
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
## 2015-06-26 ENCOUNTER — Inpatient Hospital Stay (HOSPITAL_COMMUNITY): Payer: Medicare Other | Admitting: Physical Therapy

## 2015-06-26 ENCOUNTER — Inpatient Hospital Stay (HOSPITAL_COMMUNITY): Payer: Medicare Other | Admitting: Occupational Therapy

## 2015-06-26 LAB — GLUCOSE, CAPILLARY
Glucose-Capillary: 108 mg/dL — ABNORMAL HIGH (ref 65–99)
Glucose-Capillary: 87 mg/dL (ref 65–99)
Glucose-Capillary: 85 mg/dL (ref 65–99)
Glucose-Capillary: 161 mg/dL — ABNORMAL HIGH (ref 65–99)
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
## 2015-06-26 NOTE — Progress Notes (Signed)
Physical Therapy Session Note  Patient Details  Name: Herbert Marquez MRN: [id number] Date of Birth: 11-06-1927  Today's Date: 06/26/2015 PT Individual Time: 0930-1030 and 1300-1345 PT Individual Time Calculation (min): 60 min and 45 min (total 105 min)   Short Term Goals: Week 1:  PT Short Term Goal 1 (Week 1): Pt will perform squat pivot transfer consistent minA PT Short Term Goal 2 (Week 1): Pt will perform w/c propulsion with L hemi technique minA PT Short Term Goal 3 (Week 1): Pt will perform gait x25' with LRAD and modA PT Short Term Goal 4 (Week 1): Pt will perform ascent/descent stairs with consistent modA  Skilled Therapeutic Interventions/Progress Updates:    Tx 1: Pt received supine in bed, denies pain and agreeable to treatment. Supine>sit with S and HOB elevated with bedrails. Socks/shoes donned totalA for time management. Gait 2x15' in hall with RLE GRAFO, L rail, maxA and w/c follow for convenience. Mild improvement in R knee control with use of GRAFO, however continues to have poor awareness of and ability to correct hip/knee position in flexion. Sit <>stand with RW modA; requires max cues for R knee/hip control. Gait in parallel bars with modA for R knee control and hip extension. Sit <>stand in parallel bars with maxi-sky harness; repetitive LLE stepping with cues for R hip/knee extension, then repetitive RLE stepping for focus on L weight shifting. Returned to room totalA for energy conservation. Squat pivot x3 during session, min/modA to L side. Sit >supine with S and increased time. Mod verbal/tactile cues for foot/hand placement and bridging to A with scooting toward HOB. Remained supine in bed at completion of session, all needs within reach.   Tx 2: Pt received supine in bed, denies pain and agreeable to treatment. Supine>sit with S, HOB elevated and bedrails. Squat pivot to L side x2 during session with minA, one transfer to R side with modA and stabilizing RUE on armrest.  Sit <>stand from mat table with standing balance using BUE on RW and mirror visual feedback, cues for R hip/knee extension. Standing toe taps to 3" step; unable to maintain RUE on RW and poor postural control with RW. Two trials with BUE on Eva walker with improved coordination and RLE control. Seated/supine LE exercises with 1 set 10 reps each exercise with pt given handout of pictures/instructions for performance outside of regular therapy time per pt/family request. Remained seated in w/c at completion of session, family present and all needs within reach.   Therapy Documentation Precautions:  Precautions Precautions: Fall Precaution Comments: right side weakness, impaired coordination/awareness RUE during transfers is injury risk Restrictions Weight Bearing Restrictions: No Pain: Pain Assessment Pain Assessment: No/denies pain Pain Score: 0-No pain   See Function Navigator for Current Functional Status.   Therapy/Group: Individual Therapy  Luberta Mutter 06/26/2015, 2:09 PM

## 2015-06-27 ENCOUNTER — Inpatient Hospital Stay (HOSPITAL_COMMUNITY): Payer: Medicare Other | Admitting: Occupational Therapy

## 2015-06-27 ENCOUNTER — Inpatient Hospital Stay (HOSPITAL_COMMUNITY): Payer: Medicare Other | Admitting: Physical Therapy

## 2015-06-27 ENCOUNTER — Inpatient Hospital Stay (HOSPITAL_COMMUNITY): Payer: Medicare Other | Admitting: Speech Pathology

## 2015-06-27 DIAGNOSIS — R319 Hematuria, unspecified: Secondary | ICD-10-CM

## 2015-06-27 LAB — GLUCOSE, CAPILLARY
Glucose-Capillary: 111 mg/dL — ABNORMAL HIGH (ref 65–99)
Glucose-Capillary: 98 mg/dL (ref 65–99)
Glucose-Capillary: 139 mg/dL — ABNORMAL HIGH (ref 65–99)

## 2015-06-27 LAB — URINE MICROSCOPIC-ADD ON

## 2015-06-27 LAB — URINALYSIS, ROUTINE W REFLEX MICROSCOPIC
Bilirubin Urine: NEGATIVE
Glucose, UA: NEGATIVE mg/dL
Ketones, ur: NEGATIVE mg/dL
Nitrite: NEGATIVE
Protein, ur: 100 mg/dL — AB
Specific Gravity, Urine: 1.019 (ref 1.005–1.030)
pH: 6 (ref 5.0–8.0)

## 2015-06-27 LAB — CBC
HCT: 37.9 % — ABNORMAL LOW (ref 39.0–52.0)
Hemoglobin: 12.2 g/dL — ABNORMAL LOW (ref 13.0–17.0)
MCH: 30.7 pg (ref 26.0–34.0)
MCHC: 32.2 g/dL (ref 30.0–36.0)
MCV: 95.2 fL (ref 78.0–100.0)
Platelets: 502 10*3/uL — ABNORMAL HIGH (ref 150–400)
RBC: 3.98 MIL/uL — ABNORMAL LOW (ref 4.22–5.81)
RDW: 14.7 % (ref 11.5–15.5)
WBC: 18.3 10*3/uL — ABNORMAL HIGH (ref 4.0–10.5)

## 2015-06-27 MED ORDER — CEPHALEXIN 250 MG PO CAPS
250.0000 mg | ORAL_CAPSULE | Freq: Three times a day (TID) | ORAL | Status: DC
  Administered 2015-06-27 – 2015-07-04 (×21): 250 mg via ORAL
  Filled 2015-06-27 (×22): qty 1

## 2015-06-27 NOTE — Progress Notes (Signed)
Physical Therapy Session Note  Patient Details  Name: WING GAINES MRN: [id number] Date of Birth: [birth year]  Today's Date: 06/27/2015 PT Individual Time: [id number] PT Individual Time Calculation (min): 30 min   Short Term Goals: Week 1:  PT Short Term Goal 1 (Week 1): Pt will perform squat pivot transfer consistent minA PT Short Term Goal 2 (Week 1): Pt will perform w/c propulsion with L hemi technique minA PT Short Term Goal 3 (Week 1): Pt will perform gait x25' with LRAD and modA PT Short Term Goal 4 (Week 1): Pt will perform ascent/descent stairs with consistent modA  Skilled Therapeutic Interventions/Progress Updates:    Pt received supine in bed; denies pain however decreased arousal, reports feeling weak. Rolling R/L with modA, bedrails and increased time to don boxers. Requires use of urinal x2 during session with <10 cm3 per use; placed and held urinal in place totalA. Supine<>sit modA and increased time due to decreased attention, awareness, initiation. Seated on EOB x5 min with minA initially, decreased to S, however forward flexed kyphotic posture with pt unable to extend trunk to neutral even with max tactile/verbal cues. Cued in deep breathing for pulmonary hygiene. Pt reports sitting EOB making him more tired; returned to supine as above. +2A for scooting toward HOB. Remained semi-reclined in bed, wife present and all needs within reach at completion of session. 4 bedrails per pt preference.   Therapy Documentation Precautions:  Precautions Precautions: Fall Precaution Comments: right side weakness, impaired coordination/awareness RUE during transfers is injury risk Restrictions Weight Bearing Restrictions: No General: PT Amount of Missed Time (min): 30 Minutes PT Missed Treatment Reason: Patient fatigue;Patient ill (Comment) (decreased arousal ) Pain: Pain Assessment Pain Assessment: No/denies pain Pain Score: 0-No pain   See Function Navigator for Current  Functional Status.   Therapy/Group: Individual Therapy  Luberta Mutter 06/27/2015, 2:40 PM

## 2015-06-27 NOTE — Progress Notes (Signed)
Physical Therapy Session Note  Patient Details  Name: Herbert Marquez MRN: [id number] Date of Birth: 03-Nov-1927  Today's Date: 06/27/2015 PT Individual Time: [id number] PT Individual Time Calculation (min): 20 min   Short Term Goals: Week 1:  PT Short Term Goal 1 (Week 1): Pt will perform squat pivot transfer consistent minA PT Short Term Goal 2 (Week 1): Pt will perform w/c propulsion with L hemi technique minA PT Short Term Goal 3 (Week 1): Pt will perform gait x25' with LRAD and modA PT Short Term Goal 4 (Week 1): Pt will perform ascent/descent stairs with consistent modA  Skilled Therapeutic Interventions/Progress Updates:    Pt seen for 20 minutes to make up for missed time earlier in the day. Pt received in bed with wife at bedside & pt agreeable to PT with encouragement. PT provided multimodal cuing to instruct pt to scoot to center of bed as pt was leaning to R but pt unable to follow commands & required total A for repositioning in bed. Pt denied c/o pain but would grimace when PT assisted pt with therapeutic exercises. Pt completed AAROM LLE & PROM RLE hip abduction, straight leg raises and ankle dorsiflexion/plantarflexion all 2 sets of 10 reps. Pt required constant cuing to participate in activity & for arousal. Reviewed use of call bell but pt with difficulty instructing PT on which button to push. At end of session pt left in bed with all needs within reach & wife present to supervise.   Therapy Documentation Precautions:  Precautions Precautions: Fall Precaution Comments: right side weakness, impaired coordination/awareness RUE during transfers is injury risk Restrictions Weight Bearing Restrictions: No   Pain: Pain Assessment Pain Assessment: No/denies pain Pain Score: 0-No pain   See Function Navigator for Current Functional Status.   Therapy/Group: Individual Therapy  Waunita Schooner 06/27/2015, 5:06 PM

## 2015-06-27 NOTE — Progress Notes (Signed)
Subjective/Complaints: Hematuria noted per OT           Had urinary freq last noc ,mild dysuria Has seen urology in past Dr Janice Norrie for hematuria but doesn't remember any diagnosis                                                                                                                                                Review of systems negative chest pain negative shortness of breath negative nausea negative vomiting negative diarrhea ,+ constipation, positive urinary frequency and hematuria Objective: Vital Signs: Blood pressure 138/68, pulse 79, temperature 99 F (37.2 C), temperature source Oral, resp. rate 18, height 5\' 7"  (1.702 m), weight 82.2 kg (181 lb 3.5 oz), SpO2 95 %. No results found. Results for orders placed or performed during the hospital encounter of 06/21/15 (from the past 72 hour(s))  Glucose, capillary     Status: Abnormal   Collection Time: 06/24/15 11:36 AM  Result Value Ref Range   Glucose-Capillary 101 (H) 65 - 99 mg/dL  Glucose, capillary     Status: None   Collection Time: 06/24/15  4:34 PM  Result Value Ref Range   Glucose-Capillary 78 65 - 99 mg/dL  Glucose, capillary     Status: Abnormal   Collection Time: 06/24/15  8:40 PM  Result Value Ref Range   Glucose-Capillary 131 (H) 65 - 99 mg/dL   Comment 1 Notify RN   Glucose, capillary     Status: Abnormal   Collection Time: 06/25/15  7:04 AM  Result Value Ref Range   Glucose-Capillary 105 (H) 65 - 99 mg/dL   Comment 1 Notify RN   Glucose, capillary     Status: Abnormal   Collection Time: 06/25/15 11:38 AM  Result Value Ref Range   Glucose-Capillary 103 (H) 65 - 99 mg/dL   Comment 1 Notify RN   Glucose, capillary     Status: Abnormal   Collection Time: 06/25/15  4:40 PM  Result Value Ref Range   Glucose-Capillary 136 (H) 65 - 99 mg/dL   Comment 1 Notify RN   Glucose, capillary     Status: Abnormal   Collection Time: 06/25/15  8:50 PM  Result Value Ref Range   Glucose-Capillary 112 (H) 65 - 99 mg/dL    Comment 1 Notify RN   Glucose, capillary     Status: Abnormal   Collection Time: 06/26/15  7:05 AM  Result Value Ref Range   Glucose-Capillary 108 (H) 65 - 99 mg/dL   Comment 1 Notify RN   Glucose, capillary     Status: None   Collection Time: 06/26/15 11:26 AM  Result Value Ref Range   Glucose-Capillary 87 65 - 99 mg/dL   Comment 1 Notify RN   Glucose, capillary     Status: None   Collection Time: 06/26/15  4:35 PM  Result Value  Ref Range   Glucose-Capillary 85 65 - 99 mg/dL   Comment 1 Notify RN   Glucose, capillary     Status: Abnormal   Collection Time: 06/26/15  8:53 PM  Result Value Ref Range   Glucose-Capillary 161 (H) 65 - 99 mg/dL  Glucose, capillary     Status: Abnormal   Collection Time: 06/27/15  6:43 AM  Result Value Ref Range   Glucose-Capillary 111 (H) 65 - 99 mg/dL     HEENT: normal Cardio: RRR and no murmur Resp: CTA B/L and unlbored GI: BS positive and NT, ND Extremity:  No Edema Skin:   Intact Neuro: Alert/Oriented, Abnormal Sensory absent sensation in the right upper and right lower limb, Normal Motor and Abnormal FMC Ataxic/ dec FMC Musc/Skel:  Other patient has stiffness in the left knee but no evidence of effusion. There is swelling of the right wrist with bony deformity Gen. no acute distress   Assessment/Plan: 1. 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 Physiatrist is providing close team supervision and 24 hour management of active medical problems listed below. Physiatrist and rehab team continue to assess barriers to discharge/monitor patient progress toward functional and medical goals. FIM: Function - Bathing Position: Shower Body parts bathed by patient: Right arm, Left arm, Chest, Abdomen, Front perineal area, Right upper leg, Left upper leg, Right lower leg, Left lower leg Body parts bathed by  helper: Back, Buttocks Assist Level: Supervision or verbal cues, Touching or steadying assistance(Pt > 75%) (Mod assist for standing)  Function- Upper Body Dressing/Undressing What is the patient wearing?: Pull over shirt/dress Pull over shirt/dress - Perfomed by patient: Thread/unthread left sleeve, Put head through opening, Pull shirt over trunk, Thread/unthread right sleeve Pull over shirt/dress - Perfomed by helper: Thread/unthread right sleeve Assist Level: Set up Set up : To obtain clothing/put away Function - Lower Body Dressing/Undressing What is the patient wearing?: Pants, Shoes, Ted Hose Position: Wheelchair/chair at [employer] - Performed by patient: Thread/unthread right underwear leg, Thread/unthread left underwear leg Underwear - Performed by helper: Pull underwear up/down Pants- Performed by patient: Thread/unthread right pants leg, Thread/unthread left pants leg, Pull pants up/down Pants- Performed by helper: Thread/unthread left pants leg, Pull pants up/down Socks - Performed by patient: Don/doff right sock, Don/doff left sock Shoes - Performed by patient: Don/doff right shoe, Don/doff left shoe TED Hose - Performed by helper: Don/doff right TED hose, Don/doff left TED hose Assist for footwear: Setup, Supervision/touching assist Assist for lower body dressing:  (Mod assist for standing balance)  Function - Toileting Toileting steps completed by patient: Performs perineal hygiene Toileting steps completed by helper: Adjust clothing prior to toileting, Performs perineal hygiene Assist level: More than reasonable time (Patient is hard of hearing/chronic back pain)  Function - Air cabin crew transfer activity did not occur:  (no BM since admission and using urinal) Toilet transfer assistive device: Grab bar Assist level to toilet: 2 helpers Assist level from toilet: 2 helpers  Function - Chair/bed transfer Chair/bed transfer method: Squat pivot Chair/bed  transfer assist level: Moderate assist (Pt 50 - 74%/lift or lower) Chair/bed transfer assistive device: Armrests Chair/bed transfer details: Visual cues/gestures for sequencing, Verbal cues for sequencing, Verbal cues for technique, Verbal cues for precautions/safety, Tactile cues for placement, Manual facilitation for weight shifting, Manual facilitation for placement  Function - Locomotion: Wheelchair Will patient use wheelchair at discharge?:  (TBD)  Max wheelchair distance: 150 Assist Level: Moderate assistance (Pt 50 - 74%) Assist Level: Moderate assistance (Pt 50 - 74%) Assist Level: Moderate assistance (Pt 50 - 74%) Turns around,maneuvers to table,bed, and toilet,negotiates 3% grade,maneuvers on rugs and over doorsills: No Function - Locomotion: Ambulation Assistive device: Rail in hallway Max distance: 15 Assist level: Maximal assist (Pt 25 - 49%) Assist level: Maximal assist (Pt 25 - 49%) Walk 50 feet with 2 turns activity did not occur: Safety/medical concerns Walk 150 feet activity did not occur: Safety/medical concerns Walk 10 feet on uneven surfaces activity did not occur: Safety/medical concerns  Function - Comprehension Comprehension: Auditory Comprehension assistive device: Hearing aids Comprehension assist level: Follows complex conversation/direction with extra time/assistive device  Function - Expression Expression: Verbal Expression assist level: Expresses complex ideas: With no assist  Function - Social Interaction Social Interaction assist level: Interacts appropriately with others with medication or extra time (anti-anxiety, antidepressant).  Function - Problem Solving Problem solving assist level: Solves complex 90% of the time/cues < 10% of the time  Function - Memory Memory assist level: Recognizes or recalls 75 - 89% of the time/requires cueing 10 - 24% of the time Patient normally able to recall (first 3 days only): Current season, Staff names and  faces, That he or she is in a hospital  Medical Problem List and Plan: 1.  Right side weakness, hemisensory loss with dysphagia secondary to left thalamic infarct-sensory ataxia lower greater than upper limb right side, cont plavix for now but may need to hold if hematuria continues 2.  DVT Prophylaxis/Anticoagulation: Subcutaneous Lovenox.Will d/c due to   hematuria Recheck CBC for plt count 3. Pain Management: Ultram 50 mg every 6 hours as needed moderate pain due to arthritic knee and hip             -pace as needed, we will initiate Voltaren gel for right wrist 4. Dysphagia. Dysphagia #2 thin liquid diet. Follow-up speech therapy.intake 100% of meals 5. Neuropsych: This patient is capable of making decisions on his own behalf. 6. Skin/Wound Care: Routine skin checks 7. Fluids/Electrolytes/Nutrition: Routine I&O with follow-up chemistries 8. CAD with stenting/history of PVCs. Low dose Lopressor resumed 12.5 mg twice a day. Follow-up cardiology services. Monitor with increased mobility 9. Diabetes mellitus with peripheral neuropathy. Hemoglobin A1c 5.6. Actos 15 mg daily. Check blood sugars twice a dayCBG (last 3)   Recent Labs  06/26/15 1635 06/26/15 2053 06/27/15 0643  GLUCAP 85 161* 111*    10. Hyperlipidemia. Lipitor 11. Renal cyst without other sig pathology, f/u US as outpt 12. Hypoalbuminemia, start pro-stat   13.  Hematuria- check UA C and S, check CBC, currently afebrile Filed Vitals:   06/26/15 2059 06/27/15 0605  BP: 104/54 138/68  Pulse: 80 79  Temp:  99 F (37.2 C)  Resp:  18       LOS (Days) 6 A FACE TO FACE EVALUATION WAS PERFORMED  Zaden Sako E 06/27/2015, 8:32 AM

## 2015-06-27 NOTE — Progress Notes (Signed)
Occupational Therapy Session Note  Patient Details  Name: NIV LUISI MRN: [id number] Date of Birth: February 26, 1928  Today's Date: 06/27/2015 OT Individual Time: 1432-1500 OT Individual Time Calculation (min): 28 min    Skilled Therapeutic Interventions/Progress Updates:   Pt transitioned from supine to sit EOB with mod assist.  Once in sitting pt exhibits moderate posterior pelvic tilt with increased posterior lean.  He attempts to stabilize his balance holding onto the rail on the left side of the bed.  Incorporated use of the RUE for reaching to help promote anterior pelvic tilt with forward trunk flexion.  Moderate ataxia noted in RUE during reaching.  Pt on multiple occasions requesting to use the urinal.  Max assist for placement with only minimal amount of urine noted.  Attempted standing X 1 with pt needing total assist to complete, and unable to maintain stating "I can't, I just can't".  Pt transferred back to supine with spouse present in room and call button in reach and bed alarm in place.     Therapy Documentation Precautions:  Precautions Precautions: Fall Precaution Comments: right side weakness, impaired coordination/awareness RUE during transfers is injury risk Restrictions Weight Bearing Restrictions: No  Pain: Pain Assessment Pain Assessment: No/denies pain Pain Score: 0-No pain ADL: See Function Navigator for Current Functional Status.   Therapy/Group: Individual Therapy  Yitzchak Kothari OTR/L 06/27/2015, 4:36 PM

## 2015-06-27 NOTE — Progress Notes (Signed)
Occupational Therapy Session Note  Patient Details  Name: Herbert Marquez MRN: [id number] Date of Birth: May 12, 1927  Today's Date: 06/27/2015 OT Individual Time: [id number] OT Individual Time Calculation (min): 60 min    Short Term Goals: Week 1:  OT Short Term Goal 1 (Week 1): Pt will complete bathing with min assist OT Short Term Goal 2 (Week 1): Pt will complete LB dressing with min assist for standing balance OT Short Term Goal 3 (Week 1): Pt will complete toilet transfer with min assist OT Short Term Goal 4 (Week 1): Pt will complete 1 grooming task in standing with min assist for standing balance OT Short Term Goal 5 (Week 1): Pt will visually attend to RUE during grooming tasks with min cues to increase safety due to impaired sensation and proprioception  Skilled Therapeutic Interventions/Progress Updates:    Treatment session with focus on functional transfers and functional use of RUE during self-care tasks.  Upon arrival, pt's wife voicing concerns of pt having UTI due to frequent urination and red tinged urine.  During session pt attempted to void x3 with small amounts each time.  Pt required increased physical assist and cues for sequencing with bed mobility and transfers, requiring assist to reposition RLE off EOB and back in to bed at end of session. Pt warm to touch but no fever.  Mod verbal cues for sequencing with transfers and positioning of body in w/c.  Noted increased tremors and increased ataxia in RUE when attempting to incorporate into bathing and dressing tasks.  Pt with decreased trunk control with lean to Rt in unsupported and supported sitting.  Total assist for LB dressing this session due to ataxia, tremor like movements, and overall decreased participation.  Pt reporting feeling cold and requesting to return to bed after shower.  Engaged in task with focus on RUE motor control with grasping large pegs with Rt hand on Rt and coming to midline to place in pegboard.  Pt  with increased ataxia in RUE demonstrating difficulty picking up pegs from container or from therapist and often dropping them.  Notified RN of frequent small urination and color of urine as well as difference in mobility and participation.  RN present at end of session.  Therapy Documentation Precautions:  Precautions Precautions: Fall Precaution Comments: right side weakness, impaired coordination/awareness RUE during transfers is injury risk Restrictions Weight Bearing Restrictions: No General:   Vital Signs: Therapy Vitals Pulse Rate: 77 BP: (!) 141/70 mmHg Pain:  Pt with no c/o pain this session  See Function Navigator for Current Functional Status.   Therapy/Group: Individual Therapy  [name] 06/27/2015, 10:07 AM

## 2015-06-27 NOTE — Progress Notes (Signed)
Blood noted in patient's urine, Dr Read Drivers notified, UA,culture and CBC ordered.

## 2015-06-27 NOTE — Progress Notes (Signed)
Speech Language Pathology Daily Session Note  Patient Details  Name: Herbert Marquez MRN: [id number] Date of Birth: 1927/05/31  Today's Date: 06/27/2015 SLP Individual Time: [id number] SLP Individual Time Calculation (min): 20 min  Short Term Goals: Week 1: SLP Short Term Goal 1 (Week 1): Pt to demonstrate money counting/change making at mod I level. SLP Short Term Goal 2 (Week 1): Pt to demonstrate reading comprehension at the functional level at mod I. SLP Short Term Goal 3 (Week 1): Pt to demonstrate recall of new information with min A. SLP Short Term Goal 4 (Week 1): Pt to manage Dys 3/regular consistency snacks at mod I to demonstrate readiness for diet upgrade. SLP Short Term Goal 5 (Week 1): Pt demonstrate completion of medication management task with min A.  Skilled Therapeutic Interventions:  Pt was seen for skilled ST targeting cognitive goals.  Pt was asleep upon arrival but awakened easily to voice and light touch.  Pt's wife reported that pt had had a "rough day."  Per RN, pt being worked up for ?UTI.  Pt with no complaints of pain and "wanted to try" to participate despite appearing visibly fatigued.  Pt also with labored breathing pattern, O2 sats WFL on room air.  SLP attempted to engage pt in a basic card game to address cognitive goals; however pt required total assist to even initiate turn taking, which is not his norm.  As session progressed pt became increasingly difficult to engage due to lethargy; therefore, session was ended early. As a result, pt missed 25 minutes of skilled ST.  Pt was left in bed with bed alarm set and wife at bedside.  Continue per current plan of care.   Function:  Eating Eating                 Cognition Comprehension Comprehension assist level: Understands basic 90% of the time/cues < 10% of the time (Simultaneous filing. User may not have seen previous data.)  Expression   Expression assist level: Expresses basic needs/ideas: With extra  time/assistive device (Simultaneous filing. User may not have seen previous data.)  Social Interaction Social Interaction assist level: Interacts appropriately 90% of the time - Needs monitoring or encouragement for participation or interaction. (Simultaneous filing. User may not have seen previous data.)  Problem Solving Problem solving assist level: Solves complex 90% of the time/cues < 10% of the time (Simultaneous filing. User may not have seen previous data.)  Memory Memory assist level: Recognizes or recalls 75 - 89% of the time/requires cueing 10 - 24% of the time (Simultaneous filing. User may not have seen previous data.)    Pain Pain Assessment Pain Assessment: No/denies pain Pain Score: 0-No pain  Therapy/Group: Individual Therapy  Yazlin Ekblad, Selinda Orion 06/27/2015, 4:37 PM

## 2015-06-28 ENCOUNTER — Inpatient Hospital Stay (HOSPITAL_COMMUNITY): Payer: Medicare Other | Admitting: Physical Therapy

## 2015-06-28 ENCOUNTER — Inpatient Hospital Stay (HOSPITAL_COMMUNITY): Payer: Medicare Other | Admitting: Occupational Therapy

## 2015-06-28 ENCOUNTER — Inpatient Hospital Stay (HOSPITAL_COMMUNITY): Payer: Medicare Other | Admitting: Speech Pathology

## 2015-06-28 DIAGNOSIS — N3001 Acute cystitis with hematuria: Secondary | ICD-10-CM

## 2015-06-28 LAB — GLUCOSE, CAPILLARY
Glucose-Capillary: 135 mg/dL — ABNORMAL HIGH (ref 65–99)
Glucose-Capillary: 116 mg/dL — ABNORMAL HIGH (ref 65–99)
Glucose-Capillary: 154 mg/dL — ABNORMAL HIGH (ref 65–99)

## 2015-06-28 LAB — URINE CULTURE

## 2015-06-28 LAB — CREATININE, SERUM
Creatinine, Ser: 1.13 mg/dL (ref 0.61–1.24)
GFR calc Af Amer: >60 mL/min (ref >60)
GFR calc non Af Amer: 57 mL/min — ABNORMAL LOW (ref >60)

## 2015-06-28 NOTE — Progress Notes (Signed)
Subjective/Complaints:               No further hematuria. Patient feels a lot better today. Felt very tired yesterday. No fever since yesterday                                                                                                                         Review of systems negative chest pain negative shortness of breath negative nausea negative vomiting negative diarrhea ,+ constipation, positive urinary frequency and hematuria Objective: Vital Signs: Blood pressure 100/56, pulse 80, temperature 98 F (36.7 C), temperature source Oral, resp. rate 18, height _0  (1.702 m), weight 82.8 kg (182 lb 8.7 oz), SpO2 98 %. No results found. Results for orders placed or performed during the hospital encounter of 06/21/15 (from the past 72 hour(s))  Glucose, capillary     Status: Abnormal   Collection Time: 06/25/15 11:38 AM  Result Value Ref Range   Glucose-Capillary 103 (H) 65 - 99 mg/dL   Comment 1 Notify RN   Glucose, capillary     Status: Abnormal   Collection Time: 06/25/15  4:40 PM  Result Value Ref Range   Glucose-Capillary 136 (H) 65 - 99 mg/dL   Comment 1 Notify RN   Glucose, capillary     Status: Abnormal   Collection Time: 06/25/15  8:50 PM  Result Value Ref Range   Glucose-Capillary 112 (H) 65 - 99 mg/dL   Comment 1 Notify RN   Glucose, capillary     Status: Abnormal   Collection Time: 06/26/15  7:05 AM  Result Value Ref Range   Glucose-Capillary 108 (H) 65 - 99 mg/dL   Comment 1 Notify RN   Glucose, capillary     Status: None   Collection Time: 06/26/15 11:26 AM  Result Value Ref Range   Glucose-Capillary 87 65 - 99 mg/dL   Comment 1 Notify RN   Glucose, capillary     Status: None   Collection Time: 06/26/15  4:35 PM  Result Value Ref Range   Glucose-Capillary 85 65 - 99 mg/dL   Comment 1 Notify RN   Glucose, capillary     Status: Abnormal   Collection Time: 06/26/15  8:53 PM  Result Value Ref Range   Glucose-Capillary 161 (H) 65 - 99 mg/dL  Glucose,  capillary     Status: Abnormal   Collection Time: 06/27/15  6:43 AM  Result Value Ref Range   Glucose-Capillary 111 (H) 65 - 99 mg/dL  Urinalysis, Routine w reflex microscopic (not at [facility])     Status: Abnormal   Collection Time: 06/27/15  8:28 AM  Result Value Ref Range   Color, Urine YELLOW YELLOW   APPearance CLEAR CLEAR   Specific Gravity, Urine 1.019 1.005 - 1.030   pH 6.0 5.0 - 8.0   Glucose, UA NEGATIVE NEGATIVE mg/dL   Hgb urine dipstick LARGE (A) NEGATIVE   Bilirubin Urine NEGATIVE NEGATIVE   Ketones, ur NEGATIVE NEGATIVE mg/dL  Protein, ur 100 (A) NEGATIVE mg/dL   Nitrite NEGATIVE NEGATIVE   Leukocytes, UA SMALL (A) NEGATIVE  Urine microscopic-add on     Status: Abnormal   Collection Time: 06/27/15  8:28 AM  Result Value Ref Range   Squamous Epithelial / LPF 0-5 (A) NONE SEEN   WBC, UA TOO NUMEROUS TO COUNT 0 - 5 WBC/hpf   RBC / HPF TOO NUMEROUS TO COUNT 0 - 5 RBC/hpf   Bacteria, UA MANY (A) NONE SEEN   Urine-Other LESS THAN 10 mL OF URINE SUBMITTED     Comment: URINALYSIS PERFORMED ON SUPERNATANT  CBC     Status: Abnormal   Collection Time: 06/27/15  8:51 AM  Result Value Ref Range   WBC 18.3 (H) 4.0 - 10.5 K/uL   RBC 3.98 (L) 4.22 - 5.81 MIL/uL   Hemoglobin 12.2 (L) 13.0 - 17.0 g/dL   HCT 37.9 (L) 39.0 - 52.0 %   MCV 95.2 78.0 - 100.0 fL   MCH 30.7 26.0 - 34.0 pg   MCHC 32.2 30.0 - 36.0 g/dL   RDW 14.7 11.5 - 15.5 %   Platelets 502 (H) 150 - 400 K/uL  Glucose, capillary     Status: None   Collection Time: 06/27/15 11:35 AM  Result Value Ref Range   Glucose-Capillary 98 65 - 99 mg/dL   Comment 1 Notify RN   Glucose, capillary     Status: Abnormal   Collection Time: 06/27/15  4:44 PM  Result Value Ref Range   Glucose-Capillary 139 (H) 65 - 99 mg/dL  Creatinine, serum     Status: Abnormal   Collection Time: 06/28/15  5:14 AM  Result Value Ref Range   Creatinine, Ser 1.13 0.61 - 1.24 mg/dL   GFR calc non Af Amer 57 (L) >60 mL/min   GFR calc Af Amer >60  >60 mL/min    Comment: (NOTE) The eGFR has been calculated using the CKD EPI equation. This calculation has not been validated in all clinical situations. eGFR's persistently <60 mL/min signify possible Chronic Kidney Disease.   Glucose, capillary     Status: Abnormal   Collection Time: 06/28/15  7:22 AM  Result Value Ref Range   Glucose-Capillary 135 (H) 65 - 99 mg/dL     HEENT: normal Cardio: RRR and no murmur Resp: CTA B/L and unlbored GI: BS positive and NT, ND Extremity:  No Edema Skin:   Intact Neuro: Alert/Oriented, Abnormal Sensory absent sensation in the right upper and right lower limb, Normal Motor and Abnormal FMC Ataxic/ dec FMC Musc/Skel:  Other patient has stiffness in the left knee but no evidence of effusion. There is swelling of the right wrist with bony deformity Gen. no acute distress   Assessment/Plan: 1. 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 Physiatrist is providing close team supervision and 24 hour management of active medical problems listed below. Physiatrist and rehab team continue to assess barriers to discharge/monitor patient progress toward functional and medical goals. FIM: Function - Bathing Position: Shower Body parts bathed by patient: Right arm, Left arm, Chest, Abdomen, Front perineal area, Right upper leg, Left upper leg Body parts bathed by helper: Right lower leg, Left lower leg, Back Bathing not applicable: Buttocks Assist Level: Touching or steadying assistance(Pt > 75%) (Mod assist)  Function- Upper Body Dressing/Undressing What is the patient wearing?: Pull over shirt/dress Pull over shirt/dress - Perfomed  by patient: Thread/unthread left sleeve, Put head through opening Pull over shirt/dress - Perfomed by helper: Thread/unthread right sleeve, Pull shirt over trunk Assist Level:  (Mod  assist) Set up : To obtain clothing/put away Function - Lower Body Dressing/Undressing What is the patient wearing?: Pants, Shoes, Socks Position: Wheelchair/chair at sink Underwear - Performed by patient: Thread/unthread right underwear leg, Thread/unthread left underwear leg Underwear - Performed by helper: Pull underwear up/down Pants- Performed by patient: Thread/unthread right pants leg, Thread/unthread left pants leg, Pull pants up/down Pants- Performed by helper: Thread/unthread right pants leg, Thread/unthread left pants leg, Pull pants up/down Socks - Performed by patient: Don/doff right sock, Don/doff left sock Socks - Performed by helper: Don/doff right sock, Don/doff left sock Shoes - Performed by patient: Don/doff right shoe, Don/doff left shoe Shoes - Performed by helper: Don/doff right shoe, Don/doff left shoe, Fasten right, Fasten left TED Hose - Performed by helper: Don/doff right TED hose, Don/doff left TED hose Assist for footwear: Maximal assist Assist for lower body dressing:  (Total assist, due to increased shakiness and "not feeling well")  Function - Toileting Toileting steps completed by patient: Performs perineal hygiene Toileting steps completed by helper: Adjust clothing prior to toileting, Performs perineal hygiene Assist level: More than reasonable time (Patient is hard of hearing/chronic back pain)  Function - Air cabin crew transfer activity did not occur: N/A Toilet transfer assistive device: Grab bar Assist level to toilet: 2 helpers Assist level from toilet: 2 helpers  Function - Chair/bed transfer Chair/bed transfer method: Squat pivot Chair/bed transfer assist level: Moderate assist (Pt 50 - 74%/lift or lower) Chair/bed transfer assistive device: Armrests Chair/bed transfer details: Visual cues/gestures for sequencing, Verbal cues for sequencing, Verbal cues for technique, Verbal cues for precautions/safety, Tactile cues for placement,  Manual facilitation for weight shifting, Manual facilitation for placement  Function - Locomotion: Wheelchair Will patient use wheelchair at discharge?:  (TBD) Max wheelchair distance: 150 Assist Level: Moderate assistance (Pt 50 - 74%) Assist Level: Moderate assistance (Pt 50 - 74%) Assist Level: Moderate assistance (Pt 50 - 74%) Turns around,maneuvers to table,bed, and toilet,negotiates 3% grade,maneuvers on rugs and over doorsills: No Function - Locomotion: Ambulation Assistive device: Rail in hallway Max distance: 15 Assist level: Maximal assist (Pt 25 - 49%) Assist level: Maximal assist (Pt 25 - 49%) Walk 50 feet with 2 turns activity did not occur: Safety/medical concerns Walk 150 feet activity did not occur: Safety/medical concerns Walk 10 feet on uneven surfaces activity did not occur: Safety/medical concerns  Function - Comprehension Comprehension: Auditory Comprehension assistive device: Hearing aids Comprehension assist level: Follows basic conversation/direction with extra time/assistive device  Function - Expression Expression: Verbal Expression assist level: Expresses basic needs/ideas: With extra time/assistive device  Function - Social Interaction Social Interaction assist level: Interacts appropriately with others with medication or extra time (anti-anxiety, antidepressant).  Function - Problem Solving Problem solving assist level: Solves basic 90% of the time/requires cueing < 10% of the time  Function - Memory Memory assist level: Recognizes or recalls 50 - 74% of the time/requires cueing 25 - 49% of the time Patient normally able to recall (first 3 days only): Current season, Staff names and faces, That he or she is in a hospital  Medical Problem List and Plan: 1.  Right side weakness, hemisensory loss with dysphagia secondary to left thalamic infarct- cont plavix Team conf in am  2.  DVT Prophylaxis/Anticoagulation: Subcutaneous  Lovenox.Will d/c due to   hematuria  plt count 502K on 4/24, no further bleeding noted, hemoglobin stable, resume Lovenox tomorrow 3. Pain Management: Ultram 50 mg every 6 hours as needed moderate pain due to arthritic knee and hip             -pace as needed, we will initiate Voltaren gel for right wrist 4. Dysphagia. Dysphagia #2 thin liquid diet. Follow-up speech therapy.intake 100% of meals 5. Neuropsych: This patient is capable of making decisions on his own behalf. 6. Skin/Wound Care: Routine skin checks 7. Fluids/Electrolytes/Nutrition: Routine I&O with follow-up chemistries 8. CAD with stenting/history of PVCs. Low dose Lopressor resumed 12.5 mg twice a day. Follow-up cardiology services. Monitor with increased mobility 9. Diabetes mellitus with peripheral neuropathy. Hemoglobin A1c 5.6. Actos 15 mg daily. Check blood sugars twice a dayCBG (last 3)   Recent Labs  06/27/15 1135 06/27/15 1644 06/28/15 0722  GLUCAP 98 139* 135*    10. Hyperlipidemia. Lipitor 11. Renal cyst without other sig pathology, f/u US as outpt 12. Hypoalbuminemia, start pro-stat   13.  Hematuria- UA showing evidence of UTI, probable hemmorhagic cystitis,Tmax 100.8  currently afebrile on Keflex, await urine culture results Filed Vitals:   06/27/15 2112 06/28/15 0601  BP: 113/57 100/56  Pulse: 98 80  Temp: 98.8 F (37.1 C) 98 F (36.7 C)  Resp:  18       LOS (Days) 7 A FACE TO FACE EVALUATION WAS PERFORMED  KIRSTEINS,ANDREW E 06/28/2015, 8:27 AM

## 2015-06-28 NOTE — Progress Notes (Signed)
Occupational Therapy Session Note  Patient Details  Name: Herbert Marquez MRN: [id number] Date of Birth: 05-09-1927  Today's Date: 06/28/2015 OT Individual Time: 0700-0800 OT Individual Time Calculation (min): 60 min    Short Term Goals: Week 1:  OT Short Term Goal 1 (Week 1): Pt will complete bathing with min assist OT Short Term Goal 2 (Week 1): Pt will complete LB dressing with min assist for standing balance OT Short Term Goal 3 (Week 1): Pt will complete toilet transfer with min assist OT Short Term Goal 4 (Week 1): Pt will complete 1 grooming task in standing with min assist for standing balance OT Short Term Goal 5 (Week 1): Pt will visually attend to RUE during grooming tasks with min cues to increase safety due to impaired sensation and proprioception  Skilled Therapeutic Interventions/Progress Updates:    Treatment session with focus on functional use of RUE, dynamic sitting balance, standing balance, and squat pivot transfers.  Pt received upright in bed reporting still not feeling well but feeling better than yesterday.  Engaged in self-feeding with focus on motor control and coordination of RUE with [place] and bringing to mouth.  Pt required increased time with self-feeding and demonstrating gross grasp on spoon to increase control.  Deferred bathing this session, but engaged in dressing seated at EOB. Tactile cues for sitting balance due to occasional lateral lean to Rt or posterior in sitting.  Increased time with dressing but improved participation this session.  Upon sit > stand to pull up shorts, noted pt to be incontinent of bowel.  +2 assist for hygiene in standing with therapist providing mod-max assist to facilitate upright standing while 2nd person assisted with hygiene and clothing management.  Transferred squat pivot to Rt with mod assist and cues for hand placement as pt attempting to hold onto bed rails with Lt hand making transfer of weight more difficult.  Pt left  seated at sink with wife present to assist with oral hygiene.  Therapy Documentation Precautions:  Precautions Precautions: Fall Precaution Comments: right side weakness, impaired coordination/awareness RUE during transfers is injury risk Restrictions Weight Bearing Restrictions: No General:   Vital Signs: Therapy Vitals Temp: 98 F (36.7 C) Temp Source: Oral Pulse Rate: 80 Resp: 18 BP: (!) 100/56 mmHg Patient Position (if appropriate): Lying Oxygen Therapy SpO2: 98 % O2 Device: Not Delivered Pain:  Pt with no c/o pain  See Function Navigator for Current Functional Status.   Therapy/Group: Individual Therapy  [name] 06/28/2015, 8:37 AM

## 2015-06-28 NOTE — Progress Notes (Addendum)
Physical Therapy Session Note  Patient Details  Name: Herbert Marquez MRN: [id number] Date of Birth: 1927-11-06  Today's Date: 06/28/2015 PT Individual Time: 0900-1000 and 1500-1530 PT Individual Time Calculation (min): 60 min and 30 min (total 90 min)  and Today's Date: 06/28/2015 PT Concurrent Time: 1000-1015 PT Concurrent Time Calculation (min): 15 min  Short Term Goals: Week 1:  PT Short Term Goal 1 (Week 1): Pt will perform squat pivot transfer consistent minA PT Short Term Goal 2 (Week 1): Pt will perform w/c propulsion with L hemi technique minA PT Short Term Goal 3 (Week 1): Pt will perform gait x25' with LRAD and modA PT Short Term Goal 4 (Week 1): Pt will perform ascent/descent stairs with consistent modA  Skilled Therapeutic Interventions/Progress Updates:   Tx 1: Pt received seated in recliner, denies pain and agreeable to treatment. Gait trial x15' with L rail and maxA for RLE progression and stance control, mod verbal cues for sequencing of hand placement and coordination with stepping. Pt with incontinent bowel movement; returned to room totalA. Sit <>stand at sink x2 trials with min/modA for standing balance due to R lean and poor use of RUE for support. Hygiene and clothing management [place]. Gait trial with RLE GRAFO; no significant improvement in knee control or reduction in assist level with continued buckling RLE and noted RLE hyperextension in late stance phase. One trial with sw[place]ish knee cage with pt reporting mild improvement in stability and reduction of pain, however again no significant observed change. Last trial with R knee brace, medial/lateral metal uprights with improved R weight shifting and reduction of vaulting to progress LLE. All trials 10-15' before pt fatigued, required extended seated rest breaks due to SOB and fatigue. Mod verbal cues for sequencing, hand placement. Returned to room, squat pivot transfer to bed minA. Sit >supine minA. Remained supine in bed  at completion of session, all needs within reach.   Tx 2: Pt received supine in bed, denies pain and agreeable to treatment. Supine>sit with S and increased time, bedrails and HOB elevated. Shoes and R knee brace donned totalA for energy and time conservation. Squat pivot transfer to R side with min verbal/tactile cues for hand placement and sequencing. Side stepping in parallel bars R/L to facilitate weight shifting, SLS control, stance control RLE. Required max verbal/tactile cues for upright posture, R knee/hip extension to accept weight, and hand placement due to ataxia. Improved with repetition, however continues to require repetitive cues to decreased L sided compensation. Returned to room and agreeable to remain upright in w/c; all needs within reach and wife, RN present at completion of session.   Therapy Documentation Precautions:  Precautions Precautions: Fall Precaution Comments: right side weakness, impaired coordination/awareness RUE during transfers is injury risk Restrictions Weight Bearing Restrictions: No Pain: Pain Assessment Pain Assessment: 0-10 Pain Score: 0-No pain   See Function Navigator for Current Functional Status.   Therapy/Group: Individual Therapy  Luberta Mutter 06/28/2015, 3:39 PM

## 2015-06-28 NOTE — Progress Notes (Signed)
Speech Language Pathology Daily Session Note  Patient Details  Name: ALANZO GARRETTE MRN: [id number] Date of Birth: [birth year]  Today's Date: 06/28/2015 SLP Individual Time: 1345-1430 SLP Individual Time Calculation (min): 45 min  Short Term Goals: Week 1: SLP Short Term Goal 1 (Week 1): Pt to demonstrate money counting/change making at mod I level. SLP Short Term Goal 2 (Week 1): Pt to demonstrate reading comprehension at the functional level at mod I. SLP Short Term Goal 3 (Week 1): Pt to demonstrate recall of new information with min A. SLP Short Term Goal 4 (Week 1): Pt to manage Dys 3/regular consistency snacks at mod I to demonstrate readiness for diet upgrade. SLP Short Term Goal 5 (Week 1): Pt demonstrate completion of medication management task with min A.  Skilled Therapeutic Interventions:  Pt was seen for skilled ST targeting cognitive goals.  SLP facilitated the session with a novel card game targeting semi-complex functional problem solving and recall of new information.  Pt was able to plan and execute a problem solving strategy during the abovementioned task with mod I.  He was also able to recall and utilize task rules and procedures with supervision question cues.  Pt was much brighter and clearer today in comparison to previous therapy session.  Pt was left in bed with bed alarm set and wife at bedside.  Continue per current plan of care.    Function:  Eating Eating                 Cognition Comprehension Comprehension assist level: Follows basic conversation/direction with no assist  Expression   Expression assist level: Expresses basic needs/ideas: With no assist  Social Interaction Social Interaction assist level: Interacts appropriately with others with medication or extra time (anti-anxiety, antidepressant).  Problem Solving Problem solving assist level: Solves basic 90% of the time/requires cueing < 10% of the time  Memory Memory assist level: Recognizes or  recalls 75 - 89% of the time/requires cueing 10 - 24% of the time    Pain Pain Assessment Pain Assessment: No/denies pain  Therapy/Group: Individual Therapy  Taesean Reth, Selinda Orion 06/28/2015, 4:11 PM

## 2015-06-29 ENCOUNTER — Inpatient Hospital Stay (HOSPITAL_COMMUNITY): Payer: Medicare Other | Admitting: Speech Pathology

## 2015-06-29 ENCOUNTER — Inpatient Hospital Stay (HOSPITAL_COMMUNITY): Payer: Medicare Other | Admitting: Occupational Therapy

## 2015-06-29 ENCOUNTER — Inpatient Hospital Stay (HOSPITAL_COMMUNITY): Payer: Medicare Other | Admitting: Physical Therapy

## 2015-06-29 LAB — GLUCOSE, CAPILLARY
Glucose-Capillary: 134 mg/dL — ABNORMAL HIGH (ref 65–99)
Glucose-Capillary: 124 mg/dL — ABNORMAL HIGH (ref 65–99)
Glucose-Capillary: 146 mg/dL — ABNORMAL HIGH (ref 65–99)
Glucose-Capillary: 147 mg/dL — ABNORMAL HIGH (ref 65–99)

## 2015-06-29 NOTE — Progress Notes (Signed)
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  Gait disturbance, post-stroke  06/23/2015   .  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  Alterations of sensations following CVA (cerebrovascular accident)  06/23/2015   .  Thalamic infarction ([place])  06/21/2015   .  Sinus tachycardia ([place])  06/20/2015   .  Benign essential HTN     .  Type 2 diabetes mellitus with complication, without long-term current use of insulin ([place])     .  Coronary artery disease involving native coronary artery of native heart without angina pectoris     .  Dysphagia as late effect of cerebrovascular disease     .  Low grade fever     .  Leukocytosis     .  Hypokalemia     .  Hyponatremia     .  AKI (acute kidney injury) ([place])     .  PSVT (paroxysmal supraventricular tachycardia) ([place])     .  Cerebral thrombosis with cerebral infarction  06/17/2015   .  Acute CVA (cerebrovascular accident) ([place])  06/17/2015   .  Stroke (cerebrum) ([place])     .  HLD (hyperlipidemia)     .  Syncope  06/16/2015   .  Hyperlipidemia  04/22/2014   .  UTI (lower urinary tract infection)  08/31/2011   .  Acute renal failure ([place])  08/31/2011   .  Fracture of femoral neck, left ([place])  03/09/2011   .  DM (diabetes mellitus) ([place])  03/09/2011   .  Obesity   03/09/2011   .  COPD (chronic obstructive pulmonary disease) ([place])  03/09/2011   .  Essential hypertension  03/09/2011   .  CAD (coronary artery disease)  09/20/2010   .  Diabetes mellitus  09/20/2010   .  HTN (hypertension)  09/20/2010     Expected Discharge Date: Expected Discharge Date: 07/09/15  Team Members Present: Physician leading conference: Dr. Alysia Penna Social Worker Present: Ovidio Kin, LCSW Nurse Present: Rayetta Pigg, RN PT Present: Jorge Mandril, PT OT Present: [name], OT SLP Present: Windell Moulding, SLP PPS Coordinator present : Daiva Nakayama, RN, CRRN        Current Status/Progress  Goal  Weekly Team Focus   Medical     pocketing mild oral phase dysphagia, poor coordination R side, Right knee pain  maintain medical stability  improve Right knee pain   Bowel/Bladder     Incont episodes; UTI tx with keflex; LBM except smears 4/23  Cont x2 with min assist  Continue encouraging use of urinal and regular emptying    Swallow/Nutrition/ Hydration     Dys 3 textures,  thin liquids   mod I   trials of regular textures    ADL's     Mod assist UB, total assist LB, Mod assist sit > stand and squat pivot transfers, pt with decline in function on 4/24 and 4/25 had been supervision-min assist bathing and dressing with exception of mod assist for standing balance due to Rt knee weakness  Supervision overall  pt/family education, transfers, sit > stand, ADL retraining, RUE sensation/proprioception   Mobility     minA transfers and bed mobility, mod sit <>stand, maxA gait with L rail d/t R knee instability/impaired proprioception, poor endurance   mod I bed mobility, S transfers, S gait in home, stairs, w/c propulsion  transfers, R NMR, gait training, activity tolerance    Communication               Safety/Cognition/ Behavioral Observations    mild higher level cognitive deficits    mod I   semi-complex problem solving    Pain     No complaints of pain  < 2 on  0-10  Continue to monitor pt pain qshift and as needed    Skin     Bilat arm skin tears with tegaderm   No new skin breakdown/infection while on Rehab   Continue to monitor/treat      *See Care Plan and progress notes for long and short-term goals.    Barriers to Discharge:  had a set back with UTI     Possible Resolutions to Barriers:   cont rehab and med management     Discharge Planning/Teaching Needs:   HOme with wife she has some health issues of her own and can not provide much physical care. She is here daily observing in therapies.        Team Discussion:    Goals-supervision level. Being treated for UTI doing much better today. Poor endurance and needs therapies spread out. Upgraded diet to Dys 3 thin plans to upgrade to regular with next meal. R-knee brace ordered for stability   Revisions to Treatment Plan:    Upgraded diet to Dys 3 thin and then will regular    Continued Need for Acute Rehabilitation Level of Care: The patient requires daily medical management by a physician with specialized training in physical medicine and rehabilitation for the following conditions: Daily direction of a multidisciplinary physical rehabilitation program to ensure safe treatment while eliciting the highest outcome that is of practical value to the patient.: Yes Daily medical management of patient stability for increased activity during participation in an intensive rehabilitation regime.: Yes Daily analysis of laboratory values and/or radiology reports with any subsequent need for medication adjustment of medical intervention for : Mood/behavior problems;Neurological problems  Elease Hashimoto 06/29/2015, 12:11 PM                  Patient ID: Herbert Marquez, male   DOB: September 10, 1927, 82 y.o.   MRN: [id number]

## 2015-06-29 NOTE — Patient Care Conference (Signed)
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 Gait disturbance, post-stroke 06/23/2015  . 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 Alterations of sensations following CVA (cerebrovascular accident) 06/23/2015  . Thalamic infarction ([place]) 06/21/2015  . Sinus tachycardia ([place]) 06/20/2015  . Benign essential HTN   . Type 2 diabetes mellitus with complication, without long-term current use of insulin ([place])   . Coronary artery disease involving native coronary artery of native heart without angina pectoris   . Dysphagia as late effect of cerebrovascular disease   . Low grade fever   . Leukocytosis   . Hypokalemia   . Hyponatremia   . AKI (acute kidney injury) ([place])   . PSVT (paroxysmal supraventricular tachycardia) ([place])   . Cerebral thrombosis with cerebral infarction 06/17/2015  . Acute CVA (cerebrovascular accident) ([place]) 06/17/2015  . Stroke (cerebrum) ([place])   . HLD (hyperlipidemia)   . Syncope 06/16/2015  . Hyperlipidemia 04/22/2014  . UTI (lower urinary tract infection) 08/31/2011  . Acute renal failure ([place]) 08/31/2011  . Fracture of femoral neck, left ([place]) 03/09/2011  . DM (diabetes mellitus) ([place]) 03/09/2011  . Obesity 03/09/2011  . COPD (chronic obstructive pulmonary disease) ([place]) 03/09/2011  . Essential hypertension 03/09/2011  . CAD (coronary artery disease) 09/20/2010  . Diabetes mellitus 09/20/2010  . HTN (hypertension)  09/20/2010    Expected Discharge Date: Expected Discharge Date: 07/09/15  Team Members Present: Physician leading conference: Dr. Alysia Penna Social Worker Present: Ovidio Kin, LCSW Nurse Present: Rayetta Pigg, RN PT Present: Jorge Mandril, PT OT Present: [name], OT SLP Present: Windell Moulding, SLP PPS Coordinator present : Daiva Nakayama, RN, CRRN     Current Status/Progress Goal Weekly Team Focus  Medical   pocketing mild oral phase dysphagia, poor coordination R side, Right knee pain  maintain medical stability  improve Right knee pain   Bowel/Bladder   Incont episodes; UTI tx with keflex; LBM except smears 4/23  Cont x2 with min assist  Continue encouraging use of urinal and regular emptying   Swallow/Nutrition/ Hydration   Dys 3 textures, thin liquids   mod I   trials of regular textures    ADL's   Mod assist UB, total assist LB, Mod assist sit > stand and squat pivot transfers, pt with decline in function on 4/24 and 4/25 had been supervision-min assist bathing and dressing with exception of mod assist for standing balance due to Rt knee weakness  Supervision overall  pt/family education, transfers, sit > stand, ADL retraining, RUE sensation/proprioception   Mobility   minA transfers and bed mobility, mod sit <>stand, maxA gait with L rail d/t R knee instability/impaired proprioception, poor endurance  mod I bed mobility, S transfers, S gait in home, stairs, w/c propulsion  transfers, R NMR, gait training, activity tolerance   Communication             Safety/Cognition/  Behavioral Observations  mild higher level cognitive deficits   mod I   semi-complex problem solving    Pain   No complaints of pain  < 2 on 0-10  Continue to monitor pt pain qshift and as needed   Skin   Bilat arm skin tears with tegaderm  No new skin breakdown/infection while on Rehab  Continue to monitor/treat      *See Care Plan and progress notes for long and short-term goals.  Barriers  to Discharge: had a set back with UTI    Possible Resolutions to Barriers:  cont rehab and med management    Discharge Planning/Teaching Needs:  HOme with wife she has some health issues of her own and can not provide much physical care. She is here daily observing in therapies.      Team Discussion:  Goals-supervision level. Being treated for UTI doing much better today. Poor endurance and needs therapies spread out. Upgraded diet to Dys 3 thin plans to upgrade to regular with next meal. R-knee brace ordered for stability  Revisions to Treatment Plan:  Upgraded diet to Dys 3 thin and then will regular   Continued Need for Acute Rehabilitation Level of Care: The patient requires daily medical management by a physician with specialized training in physical medicine and rehabilitation for the following conditions: Daily direction of a multidisciplinary physical rehabilitation program to ensure safe treatment while eliciting the highest outcome that is of practical value to the patient.: Yes Daily medical management of patient stability for increased activity during participation in an intensive rehabilitation regime.: Yes Daily analysis of laboratory values and/or radiology reports with any subsequent need for medication adjustment of medical intervention for : Mood/behavior problems;Neurological problems  Elease Hashimoto 06/29/2015, 12:11 PM

## 2015-06-29 NOTE — Progress Notes (Signed)
Social Work Patient ID: Herbert Marquez, male   DOB: 1927/08/28, 82 y.o.   MRN: [id number] Met with pt and wife to discuss team conference goals supervision and discharge 5/6. Pt is doing much better now UTI being treated. Wife feels he is making good progress but does want his therapies spread out due to too tiring for him with back to back therapies. She is here daily and see's his progress. Will work toward discharge 5/6.

## 2015-06-29 NOTE — Progress Notes (Signed)
Physical Therapy Session Note  Patient Details  Name: Herbert Marquez MRN: [id number] Date of Birth: Sep 10, 1927  Today's Date: 06/29/2015 PT Individual Time: 7948-0165 PT Individual Time Calculation (min): 59 min   Short Term Goals: Week 1:  PT Short Term Goal 1 (Week 1): Pt will perform squat pivot transfer consistent minA PT Short Term Goal 2 (Week 1): Pt will perform w/c propulsion with L hemi technique minA PT Short Term Goal 3 (Week 1): Pt will perform gait x25' with LRAD and modA PT Short Term Goal 4 (Week 1): Pt will perform ascent/descent stairs with consistent modA        Therapy Documentation Precautions:  Precautions Precautions: Fall Precaution Comments: right side weakness, impaired coordination/awareness RUE during transfers is injury risk Restrictions Weight Bearing Restrictions: No Pain: Pain Assessment Pain Assessment: No/denies pain  Sit to and from stand transfer in parallel bars min assist  Patient stood 2 minutes with bilateral upper extremity support on parallel bars bars min to mod assist. Focus on even weight distribution, terminal knee extension on right, positioning in midline and upright posture.  Patient alternated stepping forward and backward with left and right in parallel bars mod assist 90 seconds for two trials.    Patient ambulated 10 feet mod to max assist with left handrail and right knee brace donned. Patient ambulated with a step through gait pattern. Patient continues to demonstrate significant anterior weight shift. Patient donned right knee brace with improvements in right knee stability and lateral weight shifts.   Patient propelled wheelchair 75 feet min assist for straight trajectory and turns.  Patient performed lateral transfers min assist to even height surface.  Patient tolerated tx well with frequent and prolonged rest breaks. Patient reports increased fatigue secondary to having OT and ST session prior. Patient returned to bed  at end of session with all needs met and bed alarm engaged.   See Function Navigator for Current Functional Status.   Therapy/Group: Individual Therapy  Retta Diones 06/29/2015, 10:18 AM

## 2015-06-29 NOTE — Progress Notes (Signed)
Speech Language Pathology Weekly Progress and Session Note  Patient Details  Name: Herbert Marquez MRN: [id number] Date of Birth: 1927-03-28  Beginning of progress report period: June 22, 2015   End of progress report period: June 29, 2015   Today's Date: 06/29/2015 SLP Individual Time: 0902 - 0945; 43 minutes     Short Term Goals: Week 1: SLP Short Term Goal 1 (Week 1): Pt to demonstrate money counting/change making at mod I level. SLP Short Term Goal 1 - Progress (Week 1): Other (comment) (not addressed this reporting period ) SLP Short Term Goal 2 (Week 1): Pt to demonstrate reading comprehension at the functional level at mod I. SLP Short Term Goal 2 - Progress (Week 1): Other (comment) (not addressed this reporting period) SLP Short Term Goal 3 (Week 1): Pt to demonstrate recall of new information with min A. SLP Short Term Goal 3 - Progress (Week 1): Met SLP Short Term Goal 4 (Week 1): Pt to manage Dys 3/regular consistency snacks at mod I to demonstrate readiness for diet upgrade. SLP Short Term Goal 4 - Progress (Week 1): Met SLP Short Term Goal 5 (Week 1): Pt demonstrate completion of medication management task with min A. SLP Short Term Goal 5 - Progress (Week 1): Met    New Short Term Goals: Week 2: SLP Short Term Goal 1 (Week 2): Pt to demonstrate money counting/change making at mod I level. SLP Short Term Goal 2 (Week 2): Pt to demonstrate reading comprehension at the functional level at mod I. SLP Short Term Goal 3 (Week 2): Pt to demonstrate recall of new information with supervision.  SLP Short Term Goal 4 (Week 2): Pt to manage regular consistency snacks at mod I to demonstrate readiness for diet upgrade. SLP Short Term Goal 5 (Week 2): Pt demonstrate completion of medication management task with supervision.   Weekly Progress Updates:  Pt made functional gains this reporting period and has met 3 out of 3 targeted short term goals.  Goals for money management and  reading comprehension were not addressed to allow focus on dysphagia, recall of new information, and med management.  Pt is currently an overall min-supervision level assist for semi-complex cognitive tasks.  Pt's diet has been upgraded to dys 3 textures and thin liquids which he is consuming with mod I use of swallowing precautions.  Pt and family education is ongoing.  Pt would continue to benefit from skilled ST while inpatient in order to maximize functional independence and reduce burden of care prior to discharge.       Intensity: Minumum of 1-2 x/day, 30 to 90 minutes Frequency: 3 to 5 out of 7 days Duration/Length of Stay: 10-14 days Treatment/Interventions: Cognitive remediation/compensation;Dysphagia/aspiration precaution training;Patient/family education;Medication managment;Functional tasks;Therapeutic Activities;Multimodal communication approach;Cueing hierarchy   Daily Session  Skilled Therapeutic Interventions: Pt was seen for skilled ST targeting goals for dysphagia and cognition.  SLP facilitated the session with trials of regular textures to continue working towards diet advancement.  Pt demonstrated mild buccal pocketing with advanced solids which cleared with mod I use of liquid wash.  No overt s/s of aspiration with solids or liquids.  Recommend a trial tray of regular textures to assess carryover of swallowing precautions over a meal.  SLP also facilitated the session with a medication management task targeting recall of new information.  Pt was able to recall function of medication when named in 75% of opportunities with supervision question cues.  Will plan to address functional problem solving  with use of pill box at next available appointment.  Pt was left in wheelchair with call bell within reach.  Goals updated on this date to reflect current progress and plan of care.     Function:   Eating Eating Eating activity did not occur: N/A Modified Consistency Diet: No (trials  of regular textures with SLP ) Eating Assist Level: Swallowing techniques: self managed   Eating Set Up Assist For: Opening containers;Cutting food       Cognition Comprehension Comprehension assist level: Understands complex 90% of the time/cues 10% of the time  Expression   Expression assist level: Expresses complex ideas: With extra time/assistive device  Social Interaction Social Interaction assist level: Interacts appropriately with others with medication or extra time (anti-anxiety, antidepressant).  Problem Solving Problem solving assist level: Solves complex 90% of the time/cues < 10% of the time  Memory Memory assist level: Recognizes or recalls 75 - 89% of the time/requires cueing 10 - 24% of the time   General    Pain  No/denies pain   Therapy/Group: Individual Therapy  Herbert Marquez, Herbert Marquez 06/29/2015, 3:54 PM

## 2015-06-29 NOTE — Progress Notes (Signed)
Occupational Therapy Weekly Progress Note  Patient Details  Name: Herbert Marquez MRN: [id number] Date of Birth: 1928-01-15  Beginning of progress report period: June 22, 2015 End of progress report period: June 29, 2015  Today's Date: 06/29/2015 OT Individual Time: 0947-0962 and 1430-1500 OT Individual Time Calculation (min): 60 min and 30 min   Patient has met 2 of 5 short term goals.  Pt is making slow progress towards goals, complicated by UTI which has impacted pt arousal and participation in treatment sessions.  Pt remains a mod assist with squat pivot transfers, continues to demonstrate RUE ataxia and impaired sensation and proprioception impacting his transfers and self-care tasks, and demonstrates Rt knee weakness impacting his standing balance and tolerance.    Patient continues to demonstrate the following deficits: RUE ataxia and impaired sensation and proprioception, RLE weakness and knee instability, impaired sitting and standing balance, and overall activity tolerance and therefore will continue to benefit from skilled OT intervention to enhance overall performance with BADL and Reduce care partner burden.  Patient progressing toward long term goals..  Continue plan of care.  OT Short Term Goals Week 1:  OT Short Term Goal 1 (Week 1): Pt will complete bathing with min assist OT Short Term Goal 1 - Progress (Week 1): Met OT Short Term Goal 2 (Week 1): Pt will complete LB dressing with min assist for standing balance OT Short Term Goal 2 - Progress (Week 1): Progressing toward goal OT Short Term Goal 3 (Week 1): Pt will complete toilet transfer with min assist OT Short Term Goal 3 - Progress (Week 1): Progressing toward goal OT Short Term Goal 4 (Week 1): Pt will complete 1 grooming task in standing with min assist for standing balance OT Short Term Goal 4 - Progress (Week 1): Progressing toward goal OT Short Term Goal 5 (Week 1): Pt will visually attend to RUE during  grooming tasks with min cues to increase safety due to impaired sensation and proprioception OT Short Term Goal 5 - Progress (Week 1): Met Week 2:  OT Short Term Goal 1 (Week 2): Pt will complete bathing with min assist for standing balance OT Short Term Goal 2 (Week 2): Pt will complete LB dressing with min assist for standing balance OT Short Term Goal 3 (Week 2): Pt will complete toilet transfer with min assist OT Short Term Goal 4 (Week 2): Pt will complete 1 grooming task in standing with min assist for standing balance  Skilled Therapeutic Interventions/Progress Updates:    1) Treatment session with focus on functional transfers, sit > stand, standing balance, and functional use of RUE during self-care tasks.  Pt in bed upon arrival reporting feeling better than previous two days.  Performed squat pivot transfer to Rt to w/c with mod assist and cues to visually attend to Rt hand to maintain positioning on arm rest.  Pt reports need to toilet.  Squat pivot transfer to toilet with use of grab bars and mod assist for weight shifting.  Pt demonstrating improved control with transfers.  Engaged in bathing in room shower at sit > stand level with mod assist and blocking at Rt knee when standing to wash buttocks.  Pt attempted to wash buttocks while therapist facilitated upright standing balance followed by pt maintaining standing with min assist and UE support on grab bars while therapist washed buttocks.  Dressing completed with setup assist and mod assist for standing balance and pulling pants over hips.   2) Treatment session with  focus on dynamic sitting balance and RUE NMR with motor control.  Pt completed bed mobility with supervision and engaged in table top task seated at EOB.  Pt with no LOB in sitting this session.  Engaged in reaching activity with RUE to address motor control and proprioception, progressing to standing up small animal figurines to address ataxia and motor control.  Pt with  increased smoothness with reaching at table top level compared to reaching outside body to place items in container.  Difficulty with reaching across midline as pt knocking items over due to impaired sensation and proprioception.  Therapy Documentation Precautions:  Precautions Precautions: Fall Precaution Comments: right side weakness, impaired coordination/awareness RUE during transfers is injury risk Restrictions Weight Bearing Restrictions: No General:   Vital Signs: Therapy Vitals Temp: 98 F (36.7 C) Temp Source: Oral Pulse Rate: 82 Resp: 16 BP: 104/64 mmHg Patient Position (if appropriate): Lying Oxygen Therapy SpO2: 96 % O2 Device: Not Delivered Pain:  Pt with no c/o pain  See Function Navigator for Current Functional Status.   Therapy/Group: Individual Therapy  [name] 06/29/2015, 7:20 AM

## 2015-06-30 ENCOUNTER — Inpatient Hospital Stay (HOSPITAL_COMMUNITY): Payer: Medicare Other | Admitting: Physical Therapy

## 2015-06-30 ENCOUNTER — Inpatient Hospital Stay (HOSPITAL_COMMUNITY): Payer: Medicare Other | Admitting: Speech Pathology

## 2015-06-30 ENCOUNTER — Inpatient Hospital Stay (HOSPITAL_COMMUNITY): Payer: Medicare Other | Admitting: Occupational Therapy

## 2015-06-30 DIAGNOSIS — M1731 Unilateral post-traumatic osteoarthritis, right knee: Secondary | ICD-10-CM

## 2015-06-30 LAB — GLUCOSE, CAPILLARY
Glucose-Capillary: 121 mg/dL — ABNORMAL HIGH (ref 65–99)
Glucose-Capillary: 121 mg/dL — ABNORMAL HIGH (ref 65–99)
Glucose-Capillary: 145 mg/dL — ABNORMAL HIGH (ref 65–99)
Glucose-Capillary: 118 mg/dL — ABNORMAL HIGH (ref 65–99)

## 2015-06-30 MED ORDER — NYSTATIN 100000 UNIT/GM EX POWD
Freq: Two times a day (BID) | CUTANEOUS | Status: DC
  Administered 2015-06-30 – 2015-07-09 (×17): via TOPICAL
  Filled 2015-06-30: qty 15

## 2015-06-30 MED ORDER — NYSTATIN 100000 UNIT/GM EX POWD: Freq: Two times a day (BID) | CUTANEOUS | Status: DC

## 2015-06-30 MED ORDER — DICLOFENAC SODIUM 1 % TD GEL
2.0000 g | Freq: Four times a day (QID) | TRANSDERMAL | Status: DC
  Administered 2015-06-30 – 2015-07-09 (×30): 2 g via TOPICAL
  Filled 2015-06-30: qty 100

## 2015-06-30 MED ORDER — ENOXAPARIN SODIUM 40 MG/0.4ML ~~LOC~~ SOLN
40.0000 mg | SUBCUTANEOUS | Status: DC
  Administered 2015-06-30 – 2015-07-09 (×10): 40 mg via SUBCUTANEOUS
  Filled 2015-06-30 (×10): qty 0.4

## 2015-06-30 NOTE — Progress Notes (Signed)
Physical Therapy Weekly Progress Note  Patient Details  Name: Herbert Marquez MRN: [id number] Date of Birth: 26-Dec-1927  Beginning of progress report period: June 22, 2015 End of progress report period: June 30, 2015  Today's Date: 06/30/2015 PT Individual Time: 1100-1155 PT Individual Time Calculation (min): 55 min   Patient has met 2 of 4 short term goals. Pt requires S for bed mobility, minA to S for transfers, continues to require mod/maxA for gait due to RLE ataxia, premorbid knee instability/pain, and decreased endurance.  Patient continues to demonstrate the following deficits:  activity tolerance, balance, postural control, ability to compensate for deficits, functional use of  right upper extremity and right lower extremity and coordination and therefore will continue to benefit from skilled PT intervention to enhance overall performance with bed mobility, transfers, gait, stairs, home and community access.  Patient progressing toward long term goals..  Continue plan of care.  PT Short Term Goals Week 1:  PT Short Term Goal 1 (Week 1): Pt will perform squat pivot transfer consistent minA PT Short Term Goal 1 - Progress (Week 1): Met PT Short Term Goal 2 (Week 1): Pt will perform w/c propulsion with L hemi technique minA PT Short Term Goal 2 - Progress (Week 1): Met PT Short Term Goal 3 (Week 1): Pt will perform gait x25' with LRAD and modA PT Short Term Goal 3 - Progress (Week 1): Not met PT Short Term Goal 4 (Week 1): Pt will perform ascent/descent stairs with consistent modA PT Short Term Goal 4 - Progress (Week 1): Not met Week 2:  PT Short Term Goal 1 (Week 2): =LTG due to estimated LOS  Skilled Therapeutic Interventions/Progress Updates:    Pt received supine in bed; denies pain and agreeable to treatment. Supine <>sit at beginning/end of session with S and increased time. Squat pivot bed>w/c with close S and verbal cues for hand placement and sequencing, transfer to  return to bed minA due to fatigue. Standing in parallel bars, max verbal/tactile cues for upright posture, RLE stance control and hip extension, and weight shift to R in order to advance LLE forward/back 4x5 reps. Educated pt in increased fatigue and SOB due to high energy expenditure required for compensatory strategies, trunk rotation and flexion, and explained that once normalized posture is achieved activity will be easier and pt will be able to tolerate more reps for carryover into gait. Sit <>stand from edge of mat table with modA for RLE stability and weight shift. Standing with RW, mod/maxA for LLE forward/backward stepping as in parallel bars; increase in forward trunk lean and poor carryover of knee/hip control and weight shift. Gait in hall x20' with L rail and mod/maxA for weight shift and R knee control, cues for upright posture and step length. Returned to room and back in bed as above. Educated pt on incentive spirometer and performed x8 reps reaching maximal height on device by third repetition with cues for deep breathing. Educated pt and wife on importance of maintaining lung volume, frequent use of incentive spirometer especially as pt is in bed most of the day. Remained seated in bed with all needs in reach, 4 bedrails per pt preference at completion of session.  Therapy Documentation Precautions:  Precautions Precautions: Fall Precaution Comments: right side weakness, impaired coordination/awareness RUE during transfers is injury risk Restrictions Weight Bearing Restrictions: No Pain: Pain Assessment Pain Assessment: No/denies pain Pain Score: 0-No pain   See Function Navigator for Current Functional Status.  Therapy/Group: Individual  Therapy  Luberta Mutter 06/30/2015, 4:12 PM

## 2015-06-30 NOTE — Progress Notes (Signed)
Speech Language Pathology Daily Session Note  Patient Details  Name: Herbert Marquez MRN: [id number] Date of Birth: 1927/11/04  Today's Date: 06/30/2015 SLP Individual Time: 1305-1400 SLP Individual Time Calculation (min): 55 min  Short Term Goals: Week 2: SLP Short Term Goal 1 (Week 2): Pt to demonstrate money counting/change making at mod I level. SLP Short Term Goal 2 (Week 2): Pt to demonstrate reading comprehension at the functional level at mod I. SLP Short Term Goal 3 (Week 2): Pt to demonstrate recall of new information with supervision.  SLP Short Term Goal 4 (Week 2): Pt to manage regular consistency snacks at mod I to demonstrate readiness for diet upgrade. SLP Short Term Goal 5 (Week 2): Pt demonstrate completion of medication management task with supervision.   Skilled Therapeutic Interventions:  Pt was seen for skilled ST targeting goals for dysphagia.  SLP facilitated the session with a trial meal tray of mixed dys 3 and regular textures to continue working towards diet progression.  Pt consumed meal with mod I use of swallowing precautions to clear solids from the oral cavity.  No overt s/s of aspiration evident with thin liquids via straw.  Pt is safe to consume regular textures but when diet upgrade was offered pt reported that he preferred to remain on dys 3 textures to facilitate ease of self feeding.  SLP provided pt with a handout of dys 3 textures to assist in meal planning in the hospital and in the home environment.  Pt was left in bed with wife at bedside.  Continue per current plan of care.    Function:  Eating Eating   Modified Consistency Diet: No (trial of regular textures with SLP ) Eating Assist Level: Swallowing techniques: self managed;Set up assist for   Eating Set Up Assist For: Opening containers       Cognition Comprehension Comprehension assist level: Follows basic conversation/direction with extra time/assistive device  Expression   Expression  assist level: Expresses basic needs/ideas: With extra time/assistive device  Social Interaction Social Interaction assist level: Interacts appropriately with others with medication or extra time (anti-anxiety, antidepressant).  Problem Solving Problem solving assist level: Solves basic 90% of the time/requires cueing < 10% of the time  Memory Memory assist level: Recognizes or recalls 75 - 89% of the time/requires cueing 10 - 24% of the time    Pain Pain Assessment Pain Assessment: No/denies pain Pain Score: 0-No pain  Therapy/Group: Individual Therapy  Lindalou Soltis, Selinda Orion 06/30/2015, 4:12 PM

## 2015-06-30 NOTE — Progress Notes (Addendum)
Subjective/Complaints: Patient without further bleeding. No dysuria      right knee still sore     With weightbearing, no recent trauma                                                                                                         Review of systems negative chest pain negative shortness of breath negative nausea negative vomiting negative diarrhea ,+ constipation,  Objective: Vital Signs: Blood pressure 117/62, pulse 54, temperature 97.9 F (36.6 C), temperature source Oral, resp. rate 16, height 5' 7"  (1.702 m), weight 83.5 kg (184 lb 1.4 oz), SpO2 97 %. No results found. Results for orders placed or performed during the hospital encounter of 06/21/15 (from the past 72 hour(s))  Urinalysis, Routine w reflex microscopic (not at [facility])     Status: Abnormal   Collection Time: 06/27/15  8:28 AM  Result Value Ref Range   Color, Urine YELLOW YELLOW   APPearance CLEAR CLEAR   Specific Gravity, Urine 1.019 1.005 - 1.030   pH 6.0 5.0 - 8.0   Glucose, UA NEGATIVE NEGATIVE mg/dL   Hgb urine dipstick LARGE (A) NEGATIVE   Bilirubin Urine NEGATIVE NEGATIVE   Ketones, ur NEGATIVE NEGATIVE mg/dL   Protein, ur 100 (A) NEGATIVE mg/dL   Nitrite NEGATIVE NEGATIVE   Leukocytes, UA SMALL (A) NEGATIVE  Culture, Urine     Status: None   Collection Time: 06/27/15  8:28 AM  Result Value Ref Range   Specimen Description URINE, CLEAN CATCH    Special Requests NONE    Culture MULTIPLE SPECIES PRESENT, SUGGEST RECOLLECTION    Report Status 06/28/2015 FINAL   Urine microscopic-add on     Status: Abnormal   Collection Time: 06/27/15  8:28 AM  Result Value Ref Range   Squamous Epithelial / LPF 0-5 (A) NONE SEEN   WBC, UA TOO NUMEROUS TO COUNT 0 - 5 WBC/hpf   RBC / HPF TOO NUMEROUS TO COUNT 0 - 5 RBC/hpf   Bacteria, UA MANY (A) NONE SEEN   Urine-Other LESS THAN 10 mL OF URINE SUBMITTED     Comment: URINALYSIS PERFORMED ON SUPERNATANT  CBC     Status: Abnormal   Collection Time: 06/27/15  8:51 AM   Result Value Ref Range   WBC 18.3 (H) 4.0 - 10.5 K/uL   RBC 3.98 (L) 4.22 - 5.81 MIL/uL   Hemoglobin 12.2 (L) 13.0 - 17.0 g/dL   HCT 37.9 (L) 39.0 - 52.0 %   MCV 95.2 78.0 - 100.0 fL   MCH 30.7 26.0 - 34.0 pg   MCHC 32.2 30.0 - 36.0 g/dL   RDW 14.7 11.5 - 15.5 %   Platelets 502 (H) 150 - 400 K/uL  Glucose, capillary     Status: None   Collection Time: 06/27/15 11:35 AM  Result Value Ref Range   Glucose-Capillary 98 65 - 99 mg/dL   Comment 1 Notify RN   Glucose, capillary     Status: Abnormal   Collection Time: 06/27/15  4:44 PM  Result Value Ref Range  Glucose-Capillary 139 (H) 65 - 99 mg/dL  Creatinine, serum     Status: Abnormal   Collection Time: 06/28/15  5:14 AM  Result Value Ref Range   Creatinine, Ser 1.13 0.61 - 1.24 mg/dL   GFR calc non Af Amer 57 (L) >60 mL/min   GFR calc Af Amer >60 >60 mL/min    Comment: (NOTE) The eGFR has been calculated using the CKD EPI equation. This calculation has not been validated in all clinical situations. eGFR's persistently <60 mL/min signify possible Chronic Kidney Disease.   Glucose, capillary     Status: Abnormal   Collection Time: 06/28/15  7:22 AM  Result Value Ref Range   Glucose-Capillary 135 (H) 65 - 99 mg/dL  Glucose, capillary     Status: Abnormal   Collection Time: 06/28/15  4:33 PM  Result Value Ref Range   Glucose-Capillary 116 (H) 65 - 99 mg/dL  Glucose, capillary     Status: Abnormal   Collection Time: 06/28/15  8:46 PM  Result Value Ref Range   Glucose-Capillary 154 (H) 65 - 99 mg/dL  Glucose, capillary     Status: Abnormal   Collection Time: 06/29/15  6:55 AM  Result Value Ref Range   Glucose-Capillary 134 (H) 65 - 99 mg/dL  Glucose, capillary     Status: Abnormal   Collection Time: 06/29/15 11:45 AM  Result Value Ref Range   Glucose-Capillary 124 (H) 65 - 99 mg/dL  Glucose, capillary     Status: Abnormal   Collection Time: 06/29/15  4:28 PM  Result Value Ref Range   Glucose-Capillary 146 (H) 65 - 99  mg/dL   Comment 1 Notify RN   Glucose, capillary     Status: Abnormal   Collection Time: 06/29/15  8:25 PM  Result Value Ref Range   Glucose-Capillary 147 (H) 65 - 99 mg/dL  Glucose, capillary     Status: Abnormal   Collection Time: 06/30/15  6:33 AM  Result Value Ref Range   Glucose-Capillary 121 (H) 65 - 99 mg/dL   Comment 1 Notify RN      HEENT: normal Cardio: RRR and no murmur Resp: CTA B/L and unlbored GI: BS positive and NT, ND Extremity:  No Edema Skin:   Intact Neuro: Alert/Oriented, Abnormal Sensory absent sensation in the right upper and right lower limb, Normal Motor and Abnormal FMC Ataxic/ dec FMC Musc/Skel:  Other patient has stiffness in the left knee but no evidence of effusion. There is swelling of the right wrist with bony deformity, right knee no effusion but there is joint enlargement Gen. no acute distress   Assessment/Plan: 1. 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 Physiatrist is providing close team supervision and 24 hour management of active medical problems listed below. Physiatrist and rehab team continue to assess barriers to discharge/monitor patient progress toward functional and medical goals. FIM: Function - Bathing Position: Shower Body parts bathed by patient: Right arm, Left arm, Chest, Abdomen, Front perineal area, Right upper leg, Left upper leg, Right lower leg, Left lower leg Body parts bathed by helper: Buttocks Bathing not applicable: Back Assist Level: Touching or steadying assistance(Pt > 75%) (Mod assist for standing balance)  Function- Upper Body Dressing/Undressing What is the patient wearing?: Pull over shirt/dress Pull over shirt/dress - Perfomed by patient: Thread/unthread left sleeve, Put head through opening, Pull shirt over trunk, Thread/unthread right sleeve Pull over shirt/dress  -  Perfomed by helper: Thread/unthread right sleeve, Pull shirt over trunk Assist Level: Supervision or verbal cues, Set up Set up : To obtain clothing/put away Function - Lower Body Dressing/Undressing What is the patient wearing?: Pants, Socks, Shoes Position: Wheelchair/chair at sink Underwear - Performed by patient: Thread/unthread right underwear leg, Thread/unthread left underwear leg Underwear - Performed by helper: Pull underwear up/down Pants- Performed by patient: Thread/unthread right pants leg, Thread/unthread left pants leg Pants- Performed by helper: Pull pants up/down Non-skid slipper socks- Performed by patient: Don/doff right sock, Don/doff left sock Socks - Performed by patient: Don/doff right sock, Don/doff left sock Socks - Performed by helper: Don/doff right sock, Don/doff left sock Shoes - Performed by patient: Don/doff right shoe, Don/doff left shoe Shoes - Performed by helper: Don/doff right shoe, Don/doff left shoe, Fasten right, Fasten left TED Hose - Performed by helper: Don/doff right TED hose, Don/doff left TED hose Assist for footwear: Setup Assist for lower body dressing: Touching or steadying assistance (Pt > 75%) (Mod assist for standing balance)  Function - Toileting Toileting activity did not occur: No continent bowel/bladder event Toileting steps completed by patient: Performs perineal hygiene Toileting steps completed by helper: Adjust clothing prior to toileting, Adjust clothing after toileting Toileting Assistive Devices: Grab bar or rail Assist level:  (Max assist)  Function - Air cabin crew transfer activity did not occur: N/A Toilet transfer assistive device: Grab bar Assist level to toilet: Moderate assist (Pt 50 - 74%/lift or lower) Assist level from toilet: Moderate assist (Pt 50 - 74%/lift or lower)  Function - Chair/bed transfer Chair/bed transfer method: Squat pivot Chair/bed transfer assist level: Moderate assist (Pt 50 -  74%/lift or lower) Chair/bed transfer assistive device: Armrests Chair/bed transfer details: Visual cues/gestures for sequencing, Verbal cues for sequencing, Verbal cues for technique, Verbal cues for precautions/safety, Tactile cues for placement, Manual facilitation for weight shifting, Manual facilitation for placement  Function - Locomotion: Wheelchair Will patient use wheelchair at discharge?:  (TBD) Max wheelchair distance: 150 Assist Level: Moderate assistance (Pt 50 - 74%) Assist Level: Moderate assistance (Pt 50 - 74%) Assist Level: Moderate assistance (Pt 50 - 74%) Turns around,maneuvers to table,bed, and toilet,negotiates 3% grade,maneuvers on rugs and over doorsills: No Function - Locomotion: Ambulation Assistive device: Rail in hallway Max distance: 15 Assist level: Maximal assist (Pt 25 - 49%) Assist level: Maximal assist (Pt 25 - 49%) Walk 50 feet with 2 turns activity did not occur: Safety/medical concerns Walk 150 feet activity did not occur: Safety/medical concerns Walk 10 feet on uneven surfaces activity did not occur: Safety/medical concerns  Function - Comprehension Comprehension: Auditory Comprehension assistive device: Hearing aids Comprehension assist level: Follows basic conversation/direction with extra time/assistive device  Function - Expression Expression: Verbal Expression assist level: Expresses basic needs/ideas: With extra time/assistive device  Function - Social Interaction Social Interaction assist level: Interacts appropriately with others with medication or extra time (anti-anxiety, antidepressant).  Function - Problem Solving Problem solving assist level: Solves basic 90% of the time/requires cueing < 10% of the time  Function - Memory Memory assist level: Recognizes or recalls 50 - 74% of the time/requires cueing 25 - 49% of the time Patient normally able to recall (first 3 days only): Current season, Staff names and faces, That he or she is  in a hospital  Medical Problem List and Plan: 1.  Right side weakness, hemisensory loss with dysphagia secondary to left thalamic infarct- cont plavix Cont rehab with tent d/c 5/6  2.  DVT Prophylaxis/Anticoagulation: Subcutaneous Lovenox., no further bleeding noted, on lovenox again 3. Pain Management: Ultram 50 mg every 6 hours as needed moderate pain due to arthritic knee and hip             -pace as needed, we will initiate Voltaren gel for right wrist 4. Dysphagia. Dysphagia #2 thin liquid diet. Follow-up speech therapy.intake 100% of meals 5. Neuropsych: This patient is capable of making decisions on his own behalf. 6. Skin/Wound Care: Routine skin checks 7. Fluids/Electrolytes/Nutrition: Routine I&O with follow-up chemistries 8. CAD with stenting/history of PVCs. Low dose Lopressor resumed 12.5 mg twice a day. Follow-up cardiology services. Monitor with increased mobility 9. Diabetes mellitus with peripheral neuropathy. Hemoglobin A1c 5.6. Actos 15 mg daily. Check blood sugars twice a dayCBG (last 3)   Recent Labs  06/29/15 1628 06/29/15 2025 06/30/15 0633  GLUCAP 146* 147* 121*    10. Hyperlipidemia. Lipitor 11. Renal cyst without other sig pathology, f/u US as outpt 12. Hypoalbuminemia, start pro-stat   13.  Hematuria- UA showing evidence of UTI, Cx multiple species but clinically responding to Keflex, cont 7 day Rx  Filed Vitals:   06/29/15 2044 06/30/15 0534  BP: 100/68 117/62  Pulse: 84 54  Temp:  97.9 F (36.6 C)  Resp:  16  14. Right knee osteoarthritis posttraumatic Voltaren gel, custom knee orthosis ordered     LOS (Days) 9 A FACE TO FACE EVALUATION WAS PERFORMED  KIRSTEINS,ANDREW E 06/30/2015, 8:21 AM

## 2015-06-30 NOTE — Progress Notes (Signed)
Orthopedic Tech Progress Note Patient Details:  [name] [birth year] [id number]  Patient ID: Herbert Marquez, male   DOB: [birth year], 82 y.o.   MRN: [id number]   [place] Pink 06/30/2015, 9:57 [facility] for custom molded knee orthosis.

## 2015-06-30 NOTE — Progress Notes (Signed)
Occupational Therapy Session Note  Patient Details  Name: Herbert Marquez MRN: [id number] Date of Birth: [birth year]  Today's Date: 06/30/2015 OT Individual Time: 9381-0175 OT Individual Time Calculation (min): 75 min    Short Term Goals: Week 1:  OT Short Term Goal 1 (Week 1): Pt will complete bathing with min assist OT Short Term Goal 1 - Progress (Week 1): Met OT Short Term Goal 2 (Week 1): Pt will complete LB dressing with min assist for standing balance OT Short Term Goal 2 - Progress (Week 1): Progressing toward goal OT Short Term Goal 3 (Week 1): Pt will complete toilet transfer with min assist OT Short Term Goal 3 - Progress (Week 1): Progressing toward goal OT Short Term Goal 4 (Week 1): Pt will complete 1 grooming task in standing with min assist for standing balance OT Short Term Goal 4 - Progress (Week 1): Progressing toward goal OT Short Term Goal 5 (Week 1): Pt will visually attend to RUE during grooming tasks with min cues to increase safety due to impaired sensation and proprioception OT Short Term Goal 5 - Progress (Week 1): Met Week 2:  OT Short Term Goal 1 (Week 2): Pt will complete bathing with min assist for standing balance OT Short Term Goal 2 (Week 2): Pt will complete LB dressing with min assist for standing balance OT Short Term Goal 3 (Week 2): Pt will complete toilet transfer with min assist OT Short Term Goal 4 (Week 2): Pt will complete 1 grooming task in standing with min assist for standing balance  Skilled Therapeutic Interventions/Progress Updates:    1:1 Pt already had completed Lb dressing. Performed toilet transfer with mod A stand pivot transfer with grab bar and total A for clothing management (up and down). Pt able to complete hygiene in sitting with supervision. Pt changed shirt at sink with setup with extra time and VC for attention to right UE and coordination. Transitioned to gym. Performed squat pivot transfer to mat with min A after setup and VC  for proper setup and hand placement. Focus on core postural alignment and strengthening with dynamic sitting balance without UE or LE supported sitting on elevated mat. REaching for objects in different directions with focus on trunk with close supervision at slow rate. Donned right knee supportive brace to transition to standing balance (statically and dynamically). Perform sit to stand  And standing balance with focus on upright trunk alignment with mirror for visual feedback. Pt with improved posture at midline (with decr bias to lean towards left) with keeping his hands on his lap). Able to progress to reaching for items with right hand in upper field to promote trunk and hip extension. Pt with improved ability to maintain trunk alignment/ posture and balance with mod to max A to support right knee from buckling. Pt able to perform sit to stand from slightly elevated surface with min A with knee support. Transitioned to supine on mat with continued focus on trunk control with bridging. And modified PNF patterns for bilateral UE for controlled movement proximal to distal. Returned to room and in bed to rest afterwards.   Therapy Documentation Precautions:  Precautions Precautions: Fall Precaution Comments: right side weakness, impaired coordination/awareness RUE during transfers is injury risk Restrictions Weight Bearing Restrictions: No Pain:  no c/o pain  See Function Navigator for Current Functional Status.   Therapy/Group: Individual Therapy  [name] [facility] 06/30/2015, 10:05 AM

## 2015-07-01 ENCOUNTER — Inpatient Hospital Stay (HOSPITAL_COMMUNITY): Payer: Medicare Other | Admitting: Occupational Therapy

## 2015-07-01 ENCOUNTER — Inpatient Hospital Stay (HOSPITAL_COMMUNITY): Payer: Medicare Other | Admitting: Speech Pathology

## 2015-07-01 ENCOUNTER — Inpatient Hospital Stay (HOSPITAL_COMMUNITY): Payer: Medicare Other | Admitting: Physical Therapy

## 2015-07-01 LAB — GLUCOSE, CAPILLARY
Glucose-Capillary: 110 mg/dL — ABNORMAL HIGH (ref 65–99)
Glucose-Capillary: 123 mg/dL — ABNORMAL HIGH (ref 65–99)
Glucose-Capillary: 103 mg/dL — ABNORMAL HIGH (ref 65–99)
Glucose-Capillary: 124 mg/dL — ABNORMAL HIGH (ref 65–99)

## 2015-07-01 NOTE — Progress Notes (Signed)
Occupational Therapy Session Note  Patient Details  Name: Herbert Marquez MRN: [id number] Date of Birth: [birth year]  Today's Date: 07/01/2015 OT Individual Time: [id number] Individual Time Calculation (min): 60 min    Short Term Goals: Week 2:  OT Short Term Goal 1 (Week 2): Pt will complete bathing with min assist for standing balance OT Short Term Goal 2 (Week 2): Pt will complete LB dressing with min assist for standing balance OT Short Term Goal 3 (Week 2): Pt will complete toilet transfer with min assist OT Short Term Goal 4 (Week 2): Pt will complete 1 grooming task in standing with min assist for standing balance  Skilled Therapeutic Interventions/Progress Updates:    1:1 self care retraining at shower level. Focus on bed mobility to come to EOB with min A. Mod A squat pivot transfer towards the right into the w/c and right to the toilet using grab bar. Pt transferred back to the w/c (on the left) with mod A and then towards the right into shower (onto tub bench). Focus on functional use of right UE (coordination, normal patterns of movements). Sit to stand in shower with mod A with max cuing for attention to trunk control and functional use of right LE- to remain in proper placement. Discussed with wife option (if the w/c will fit through bathroom door- asked her to measure this weekend) of taking off door to shower and putting up temporary tension rod and shower curtain with tub bench for access shower safely. Pt dressed sit to stand at sink. Pt able to pull up pants with mod A for standing balance (with most of the A at right knee for control and to remain in proper alignment. Pt able to incorporate right hand without cuing appropriately during grooming tasks seated.   Therapy Documentation Precautions:  Precautions Precautions: Fall Precaution Comments: right side weakness, impaired coordination/awareness RUE during transfers is injury risk Restrictions Weight Bearing  Restrictions: No Pain: No c/o pain in session  See Function Navigator for Current Functional Status.   Therapy/Group: Individual Therapy  [name] [facility] 07/01/2015, 8:47 AM

## 2015-07-01 NOTE — Progress Notes (Signed)
Speech Language Pathology Daily Session Note  Patient Details  Name: Herbert Marquez MRN: [id number] Date of Birth: [birth year]  Today's Date: 07/01/2015 SLP Individual Time: 1105-1200 SLP Individual Time Calculation (min): 55 min  Short Term Goals: Week 2: SLP Short Term Goal 1 (Week 2): Pt to demonstrate money counting/change making at mod I level. SLP Short Term Goal 2 (Week 2): Pt to demonstrate reading comprehension at the functional level at mod I. SLP Short Term Goal 3 (Week 2): Pt to demonstrate recall of new information with supervision.  SLP Short Term Goal 4 (Week 2): Pt to manage regular consistency snacks at mod I to demonstrate readiness for diet upgrade. SLP Short Term Goal 5 (Week 2): Pt demonstrate completion of medication management task with supervision.   Skilled Therapeutic Interventions:  Pt was seen for skilled ST targeting cognitive goals. SLP facilitated the session with a medication management task targeting semi-complex problem solving.  Pt initially was able to organize once daily medications into a QD pill box with mod I; however, he required max assist verbal cues for organization, working memory, and problem solving to load BID and QD medications into pill box.  Suspect novelty of varying dosages and frequencies and more complex pill box to the be the primary cause of pt's difficulty given reports that pt only used 1x/day pill box. Will plan to repeat task with a 2x/day pill box given that his 3x/day medication is a temporary antibiotic and can be managed without the use of a pill box.  Wife and pt in agreement.  SLP also recommended that pt have assistance with medication management at discharge.  Pt and wife verbalized understanding.  Pt left in wheelchair with family at bedside.  Continue per current plan of care.    Function:  Eating Eating                 Cognition Comprehension Comprehension assist level: Follows basic conversation/direction with  extra time/assistive device  Expression   Expression assist level: Expresses basic needs/ideas: With extra time/assistive device  Social Interaction Social Interaction assist level: Interacts appropriately with others with medication or extra time (anti-anxiety, antidepressant).  Problem Solving Problem solving assist level: Solves basic 75 - 89% of the time/requires cueing 10 - 24% of the time  Memory Memory assist level: Recognizes or recalls 75 - 89% of the time/requires cueing 10 - 24% of the time    Pain Pain Assessment Pain Assessment: No/denies pain Pain Score: 0-No pain  Therapy/Group: Individual Therapy  Herbert Marquez, Herbert Marquez 07/01/2015, 12:44 PM

## 2015-07-01 NOTE — Progress Notes (Signed)
Physical Therapy Session Note  Patient Details  Name: Herbert Marquez MRN: [id number] Date of Birth: [birth year]  Today's Date: 07/01/2015 PT Individual Time: 1300-1400 PT Individual Time Calculation (min): 60 min   Short Term Goals: Week 2:  PT Short Term Goal 1 (Week 2): =LTG due to estimated LOS  Skilled Therapeutic Interventions/Progress Updates:    Pt received seated in w/c, denies pain and agreeable to treatment. Orthotist present for gait evaluation for possible knee brace vs AFO. Completed one trial with knee brace and L handrail x15', maxA. One trial with knee brace and GRAFO, L rail and mod/maxA. Gait x30' with RW and R hand splint and knee brace/AFO RLE with min/modA and +2 w/c follow for safety; required assist for straight navigation of RW, cues for upright posture, and inconsistent placement/progression of RLE due to ataxia and impaired proprioception. Gait 2x10' including turn to sit on mat table, min/modA with max cues for sequencing during turn. Stand pivot table>w/c with RW and minA, mod verbal cues for sequencing and safety. Squat pivot to return to bed minA. Sit >supine with S and increased time. Remained supine in bed at completion of session, all needs within reach.   Therapy Documentation Precautions:  Precautions Precautions: Fall Precaution Comments: right side weakness, impaired coordination/awareness RUE during transfers is injury risk Restrictions Weight Bearing Restrictions: No Pain: Pain Assessment Pain Assessment: No/denies pain   See Function Navigator for Current Functional Status.   Therapy/Group: Individual Therapy  Luberta Mutter 07/01/2015, 3:37 PM

## 2015-07-02 ENCOUNTER — Inpatient Hospital Stay (HOSPITAL_COMMUNITY): Payer: Medicare Other | Admitting: Occupational Therapy

## 2015-07-02 LAB — GLUCOSE, CAPILLARY
Glucose-Capillary: 105 mg/dL — ABNORMAL HIGH (ref 65–99)
Glucose-Capillary: 117 mg/dL — ABNORMAL HIGH (ref 65–99)
Glucose-Capillary: 123 mg/dL — ABNORMAL HIGH (ref 65–99)
Glucose-Capillary: 120 mg/dL — ABNORMAL HIGH (ref 65–99)

## 2015-07-02 NOTE — Progress Notes (Signed)
Subjective/Complaints: No issues overnight. Patient  Denies any blood in the urine.                                                                                                    Review of systems negative chest pain negative shortness of breath negative nausea negative vomiting negative diarrhea ,+ constipation,  Objective: Vital Signs: Blood pressure 98/63, pulse 85, temperature 98.8 F (37.1 C), temperature source Oral, resp. rate 17, height 5\' 7"  (1.702 m), weight 81.3 kg (179 lb 3.7 oz), SpO2 97 %. No results found. Results for orders placed or performed during the hospital encounter of 06/21/15 (from the past 72 hour(s))  Glucose, capillary     Status: Abnormal   Collection Time: 06/29/15 11:45 AM  Result Value Ref Range   Glucose-Capillary 124 (H) 65 - 99 mg/dL  Glucose, capillary     Status: Abnormal   Collection Time: 06/29/15  4:28 PM  Result Value Ref Range   Glucose-Capillary 146 (H) 65 - 99 mg/dL   Comment 1 Notify RN   Glucose, capillary     Status: Abnormal   Collection Time: 06/29/15  8:25 PM  Result Value Ref Range   Glucose-Capillary 147 (H) 65 - 99 mg/dL  Glucose, capillary     Status: Abnormal   Collection Time: 06/30/15  6:33 AM  Result Value Ref Range   Glucose-Capillary 121 (H) 65 - 99 mg/dL   Comment 1 Notify RN   Glucose, capillary     Status: Abnormal   Collection Time: 06/30/15 11:59 AM  Result Value Ref Range   Glucose-Capillary 121 (H) 65 - 99 mg/dL  Glucose, capillary     Status: Abnormal   Collection Time: 06/30/15  4:29 PM  Result Value Ref Range   Glucose-Capillary 145 (H) 65 - 99 mg/dL  Glucose, capillary     Status: Abnormal   Collection Time: 06/30/15  8:36 PM  Result Value Ref Range   Glucose-Capillary 118 (H) 65 - 99 mg/dL  Glucose, capillary     Status: Abnormal   Collection Time: 07/01/15  6:50 AM  Result Value Ref Range   Glucose-Capillary 110 (H) 65 - 99 mg/dL  Glucose, capillary     Status: Abnormal   Collection Time:  07/01/15 12:17 PM  Result Value Ref Range   Glucose-Capillary 123 (H) 65 - 99 mg/dL  Glucose, capillary     Status: Abnormal   Collection Time: 07/01/15  5:03 PM  Result Value Ref Range   Glucose-Capillary 103 (H) 65 - 99 mg/dL  Glucose, capillary     Status: Abnormal   Collection Time: 07/01/15  9:02 PM  Result Value Ref Range   Glucose-Capillary 124 (H) 65 - 99 mg/dL   Comment 1 Notify RN   Glucose, capillary     Status: Abnormal   Collection Time: 07/02/15  6:43 AM  Result Value Ref Range   Glucose-Capillary 105 (H) 65 - 99 mg/dL   Comment 1 Notify RN      HEENT: normal Cardio: RRR and no murmur Resp: CTA B/L and unlbored GI: BS  positive and NT, ND Extremity:  No Edema Skin:   Intact Neuro: Alert/Oriented, Abnormal Sensory absent sensation in the right upper and right lower limb, Normal Motor and Abnormal FMC Ataxic/ dec FMC Musc/Skel:  Other patient has stiffness in the left knee but no evidence of effusion. There is swelling of the right wrist with bony deformity, right knee no effusion but there is joint enlargement Gen. no acute distress   Assessment/Plan: 1. 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 Physiatrist is providing close team supervision and 24 hour management of active medical problems listed below. Physiatrist and rehab team continue to assess barriers to discharge/monitor patient progress toward functional and medical goals. FIM: Function - Bathing Position: Shower Body parts bathed by patient: Right arm, Left arm, Chest, Abdomen, Front perineal area, Right upper leg, Left upper leg, Right lower leg, Left lower leg Body parts bathed by helper: Buttocks, Back Bathing not applicable: Back Assist Level: Touching or steadying assistance(Pt > 75%)  Function- Upper Body Dressing/Undressing What is the patient  wearing?: Pull over shirt/dress Pull over shirt/dress - Perfomed by patient: Thread/unthread left sleeve, Put head through opening, Pull shirt over trunk, Thread/unthread right sleeve Pull over shirt/dress - Perfomed by helper: Thread/unthread right sleeve, Pull shirt over trunk Assist Level: Supervision or verbal cues, Set up Set up : To obtain clothing/put away Function - Lower Body Dressing/Undressing What is the patient wearing?: Pants, Socks, Shoes Position: Wheelchair/chair at sink Underwear - Performed by patient: Thread/unthread right underwear leg, Thread/unthread left underwear leg Underwear - Performed by helper: Pull underwear up/down Pants- Performed by patient: Thread/unthread right pants leg, Thread/unthread left pants leg Pants- Performed by helper: Pull pants up/down Non-skid slipper socks- Performed by patient: Don/doff right sock, Don/doff left sock Socks - Performed by patient: Don/doff right sock, Don/doff left sock Socks - Performed by helper: Don/doff right sock, Don/doff left sock Shoes - Performed by patient: Don/doff right shoe, Don/doff left shoe Shoes - Performed by helper: Don/doff right shoe, Don/doff left shoe, Fasten right, Fasten left TED Hose - Performed by helper: Don/doff right TED hose, Don/doff left TED hose Assist for footwear: Supervision/touching assist Assist for lower body dressing: Touching or steadying assistance (Pt > 75%)  Function - Toileting Toileting activity did not occur: No continent bowel/bladder event Toileting steps completed by patient: Performs perineal hygiene Toileting steps completed by helper: Adjust clothing prior to toileting, Adjust clothing after toileting Toileting Assistive Devices: Grab bar or rail Assist level: Touching or steadying assistance (Pt.75%)  Function - Air cabin crew transfer activity did not occur: N/A Toilet transfer assistive device: Grab bar Assist level to toilet: Moderate assist (Pt 50 -  74%/lift or lower) Assist level from toilet: Moderate assist (Pt 50 - 74%/lift or lower)  Function - Chair/bed transfer Chair/bed transfer method: Stand pivot Chair/bed transfer assist level: Touching or steadying assistance (Pt > 75%) Chair/bed transfer assistive device: Walker Chair/bed transfer details: Visual cues/gestures for sequencing, Verbal cues for sequencing, Verbal cues for technique, Verbal cues for precautions/safety, Tactile cues for placement, Manual facilitation for weight shifting, Manual facilitation for placement  Function - Locomotion: Wheelchair Will patient use wheelchair at discharge?:  (TBD) Max wheelchair distance: 150 Assist Level: Moderate assistance (Pt 50 - 74%) Assist Level: Moderate assistance (Pt 50 - 74%) Assist Level: Moderate assistance (Pt 50 - 74%) Turns around,maneuvers to table,bed, and toilet,negotiates 3% grade,maneuvers on rugs and  over doorsills: No Function - Locomotion: Ambulation Assistive device: Walker-rolling, Orthosis (RLE GRAFO, knee brace, hand splint) Max distance: 30 Assist level: 2 helpers (minA, w/c follow) Assist level: 2 helpers (minA, w/c follow) Walk 50 feet with 2 turns activity did not occur: Safety/medical concerns Walk 150 feet activity did not occur: Safety/medical concerns Walk 10 feet on uneven surfaces activity did not occur: Safety/medical concerns  Function - Comprehension Comprehension: Auditory Comprehension assistive device: Hearing aids Comprehension assist level: Follows basic conversation/direction with extra time/assistive device  Function - Expression Expression: Verbal Expression assist level: Expresses basic needs/ideas: With extra time/assistive device  Function - Social Interaction Social Interaction assist level: Interacts appropriately with others with medication or extra time (anti-anxiety, antidepressant).  Function - Problem Solving Problem solving assist level: Solves basic 75 - 89% of the  time/requires cueing 10 - 24% of the time  Function - Memory Memory assist level: Recognizes or recalls 75 - 89% of the time/requires cueing 10 - 24% of the time Patient normally able to recall (first 3 days only): Current season, Staff names and faces, That he or she is in a hospital  Medical Problem List and Plan: 1.  Right side weakness, hemisensory loss with dysphagia secondary to left thalamic infarct- cont plavix Cont rehab with tent d/c 5/6, Discussed with daughter, may need to be in wheelchair at home after discharge and ambulate with therapy only                         2.  DVT Prophylaxis/Anticoagulation: Subcutaneous Lovenox., no further bleeding noted, on lovenox again 3. Pain Management: Ultram 50 mg every 6 hours as needed moderate pain due to arthritic knee and hip             -pace as needed, we will initiate Voltaren gel for right wrist 4. Dysphagia. Dysphagia #2 thin liquid diet. Follow-up speech therapy.intake 100% of meals 5. Neuropsych: This patient is capable of making decisions on his own behalf. 6. Skin/Wound Care: Routine skin checks 7. Fluids/Electrolytes/Nutrition: Routine I&O with follow-up chemistries 8. CAD with stenting/history of PVCs. Low dose Lopressor resumed 12.5 mg twice a day. Follow-up cardiology services. Monitor with increased mobility 9. Diabetes mellitus with peripheral neuropathy. Hemoglobin A1c 5.6. Actos 15 mg daily. Check blood sugars twice a dayCBG (last 3)   Recent Labs  07/01/15 1703 07/01/15 2102 07/02/15 0643  GLUCAP 103* 124* 105*    10. Hyperlipidemia. Lipitor 11. Renal cyst without other sig pathology, f/u US as outpt 12. Hypoalbuminemia, start pro-stat   13.  Hematuria- UA showing evidence of UTI, Cx multiple species but clinically responding to Keflex, cont 7 day Rx  Filed Vitals:   07/02/15 0505 07/02/15 0821  BP: 117/72 98/63  Pulse: 86 85  Temp: 98.8 F (37.1 C)   Resp: 17   14. Right knee osteoarthritis posttraumatic  Voltaren gel, custom knee orthosis ordered     LOS (Days) 11 A FACE TO FACE EVALUATION WAS PERFORMED  Tamber Burtch E 07/02/2015, 10:44 AM

## 2015-07-02 NOTE — Progress Notes (Signed)
Occupational Therapy Session Note  Patient Details  Name: Herbert Marquez MRN: [id number] Date of Birth: Jul 22, 1927  Today's Date: 07/02/2015 OT Individual Time:  -   1000-1030   (30 min)      Short Term Goals: Week 1:  OT Short Term Goal 1 (Week 1): Pt will complete bathing with min assist OT Short Term Goal 1 - Progress (Week 1): Met OT Short Term Goal 2 (Week 1): Pt will complete LB dressing with min assist for standing balance OT Short Term Goal 2 - Progress (Week 1): Progressing toward goal OT Short Term Goal 3 (Week 1): Pt will complete toilet transfer with min assist OT Short Term Goal 3 - Progress (Week 1): Progressing toward goal OT Short Term Goal 4 (Week 1): Pt will complete 1 grooming task in standing with min assist for standing balance OT Short Term Goal 4 - Progress (Week 1): Progressing toward goal OT Short Term Goal 5 (Week 1): Pt will visually attend to RUE during grooming tasks with min cues to increase safety due to impaired sensation and proprioception OT Short Term Goal 5 - Progress (Week 1): Met Week 2:  OT Short Term Goal 1 (Week 2): Pt will complete bathing with min assist for standing balance OT Short Term Goal 2 (Week 2): Pt will complete LB dressing with min assist for standing balance OT Short Term Goal 3 (Week 2): Pt will complete toilet transfer with min assist OT Short Term Goal 4 (Week 2): Pt will complete 1 grooming task in standing with min assist for standing balance  Skilled Therapeutic Interventions/Progress Updates:    Pt. Engaged in transfers and functional mobility.  Daughter, Angela Nevin present.  Pt stood at sink and engaged in grooming activities (brushing teeth ) for 2 minutes with min assist before taking a rest break.  Pt.transferred to bed with min assist.  Left with all needs in reach.     Therapy Documentation Precautions:  Precautions Precautions: Fall Precaution Comments: right side weakness, impaired coordination/awareness RUE during  transfers is injury risk Restrictions Weight Bearing Restrictions: No    Vital Signs: Therapy Vitals Pulse Rate: 85 BP: 98/63 mmHg Pain:  none     See Function Navigator for Current Functional Status.   Therapy/Group: Individual Therapy  Lisa Roca 07/02/2015, 10:22 AM

## 2015-07-03 ENCOUNTER — Inpatient Hospital Stay (HOSPITAL_COMMUNITY): Payer: Medicare Other | Admitting: Physical Therapy

## 2015-07-03 LAB — GLUCOSE, CAPILLARY: Glucose-Capillary: 109 mg/dL — ABNORMAL HIGH (ref 65–99)

## 2015-07-03 NOTE — Progress Notes (Addendum)
Subjective/Complaints: Patient is practicing incentive spirometry. Mood and affect appear brighter. Wife is pleased with his progress                                                                                                  Review of systems negative chest pain negative shortness of breath negative nausea negative vomiting negative diarrhea ,+ constipation,  Objective: Vital Signs: Blood pressure 129/74, pulse 77, temperature 98.6 F (37 C), temperature source Oral, resp. rate 17, height 5\' 7"  (1.702 m), weight 81.6 kg (179 lb 14.3 oz), SpO2 96 %. No results found. Results for orders placed or performed during the hospital encounter of 06/21/15 (from the past 72 hour(s))  Glucose, capillary     Status: Abnormal   Collection Time: 06/30/15 11:59 AM  Result Value Ref Range   Glucose-Capillary 121 (H) 65 - 99 mg/dL  Glucose, capillary     Status: Abnormal   Collection Time: 06/30/15  4:29 PM  Result Value Ref Range   Glucose-Capillary 145 (H) 65 - 99 mg/dL  Glucose, capillary     Status: Abnormal   Collection Time: 06/30/15  8:36 PM  Result Value Ref Range   Glucose-Capillary 118 (H) 65 - 99 mg/dL  Glucose, capillary     Status: Abnormal   Collection Time: 07/01/15  6:50 AM  Result Value Ref Range   Glucose-Capillary 110 (H) 65 - 99 mg/dL  Glucose, capillary     Status: Abnormal   Collection Time: 07/01/15 12:17 PM  Result Value Ref Range   Glucose-Capillary 123 (H) 65 - 99 mg/dL  Glucose, capillary     Status: Abnormal   Collection Time: 07/01/15  5:03 PM  Result Value Ref Range   Glucose-Capillary 103 (H) 65 - 99 mg/dL  Glucose, capillary     Status: Abnormal   Collection Time: 07/01/15  9:02 PM  Result Value Ref Range   Glucose-Capillary 124 (H) 65 - 99 mg/dL   Comment 1 Notify RN   Glucose, capillary     Status: Abnormal   Collection Time: 07/02/15  6:43 AM  Result Value Ref Range   Glucose-Capillary 105 (H) 65 - 99 mg/dL   Comment 1 Notify RN   Glucose, capillary      Status: Abnormal   Collection Time: 07/02/15 11:45 AM  Result Value Ref Range   Glucose-Capillary 117 (H) 65 - 99 mg/dL  Glucose, capillary     Status: Abnormal   Collection Time: 07/02/15  4:33 PM  Result Value Ref Range   Glucose-Capillary 123 (H) 65 - 99 mg/dL  Glucose, capillary     Status: Abnormal   Collection Time: 07/02/15  8:42 PM  Result Value Ref Range   Glucose-Capillary 120 (H) 65 - 99 mg/dL   Comment 1 Notify RN   Glucose, capillary     Status: Abnormal   Collection Time: 07/03/15  6:40 AM  Result Value Ref Range   Glucose-Capillary 109 (H) 65 - 99 mg/dL   Comment 1 Notify RN      HEENT: normal Cardio: RRR and no murmur Resp: CTA B/L and  unlbored GI: BS positive and NT, ND Extremity:  No Edema Skin:   Intact Neuro: Alert/Oriented, Abnormal Sensory absent sensation in the right upper and right lower limb, Normal Motor and Abnormal FMC Ataxic/ dec FMC Musc/Skel:  Other patient has stiffness in the left knee but no evidence of effusion. There is swelling of the right wrist with bony deformity, right knee no effusion but there is joint enlargement Gen. no acute distress   Assessment/Plan: 1. 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 Physiatrist is providing close team supervision and 24 hour management of active medical problems listed below. Physiatrist and rehab team continue to assess barriers to discharge/monitor patient progress toward functional and medical goals. FIM: Function - Bathing Position: Shower Body parts bathed by patient: Right arm, Left arm, Chest, Abdomen, Front perineal area, Right upper leg, Left upper leg, Right lower leg, Left lower leg Body parts bathed by helper: Buttocks, Back Bathing not applicable: Back Assist Level: Touching or steadying assistance(Pt > 75%)  Function- Upper Body  Dressing/Undressing What is the patient wearing?: Pull over shirt/dress Pull over shirt/dress - Perfomed by patient: Thread/unthread left sleeve, Put head through opening, Pull shirt over trunk, Thread/unthread right sleeve Pull over shirt/dress - Perfomed by helper: Thread/unthread right sleeve, Pull shirt over trunk Assist Level: Supervision or verbal cues, Set up Set up : To obtain clothing/put away Function - Lower Body Dressing/Undressing What is the patient wearing?: Pants, Socks, Shoes Position: Wheelchair/chair at sink Underwear - Performed by patient: Thread/unthread right underwear leg, Thread/unthread left underwear leg Underwear - Performed by helper: Pull underwear up/down Pants- Performed by patient: Thread/unthread right pants leg, Thread/unthread left pants leg Pants- Performed by helper: Pull pants up/down Non-skid slipper socks- Performed by patient: Don/doff right sock, Don/doff left sock Socks - Performed by patient: Don/doff right sock, Don/doff left sock Socks - Performed by helper: Don/doff right sock, Don/doff left sock Shoes - Performed by patient: Don/doff right shoe, Don/doff left shoe Shoes - Performed by helper: Don/doff right shoe, Don/doff left shoe, Fasten right, Fasten left TED Hose - Performed by helper: Don/doff right TED hose, Don/doff left TED hose Assist for footwear: Supervision/touching assist Assist for lower body dressing: Touching or steadying assistance (Pt > 75%)  Function - Toileting Toileting activity did not occur: No continent bowel/bladder event Toileting steps completed by patient: Performs perineal hygiene Toileting steps completed by helper: Adjust clothing prior to toileting, Adjust clothing after toileting Toileting Assistive Devices: Grab bar or rail Assist level: Touching or steadying assistance (Pt.75%)  Function - Air cabin crew transfer activity did not occur: N/A Toilet transfer assistive device: Grab bar Assist  level to toilet: Moderate assist (Pt 50 - 74%/lift or lower) Assist level from toilet: Moderate assist (Pt 50 - 74%/lift or lower)  Function - Chair/bed transfer Chair/bed transfer method: Stand pivot Chair/bed transfer assist level: Touching or steadying assistance (Pt > 75%) Chair/bed transfer assistive device: Walker Chair/bed transfer details: Visual cues/gestures for sequencing, Verbal cues for sequencing, Verbal cues for technique, Verbal cues for precautions/safety, Tactile cues for placement, Manual facilitation for weight shifting, Manual facilitation for placement  Function - Locomotion: Wheelchair Will patient use wheelchair at discharge?:  (TBD) Max wheelchair distance: 150 Assist Level: Moderate assistance (Pt 50 - 74%) Assist Level: Moderate assistance (Pt 50 - 74%) Assist Level: Moderate assistance (Pt 50 - 74%) Turns around,maneuvers to table,bed, and toilet,negotiates 3% grade,maneuvers  on rugs and over doorsills: No Function - Locomotion: Ambulation Assistive device: Walker-rolling, Orthosis (RLE GRAFO, knee brace, hand splint) Max distance: 30 Assist level: 2 helpers (minA, w/c follow) Assist level: 2 helpers (minA, w/c follow) Walk 50 feet with 2 turns activity did not occur: Safety/medical concerns Walk 150 feet activity did not occur: Safety/medical concerns Walk 10 feet on uneven surfaces activity did not occur: Safety/medical concerns  Function - Comprehension Comprehension: Auditory Comprehension assistive device: Hearing aids Comprehension assist level: Follows basic conversation/direction with extra time/assistive device  Function - Expression Expression: Verbal Expression assist level: Expresses basic needs/ideas: With extra time/assistive device  Function - Social Interaction Social Interaction assist level: Interacts appropriately with others with medication or extra time (anti-anxiety, antidepressant).  Function - Problem Solving Problem solving  assist level: Solves basic 75 - 89% of the time/requires cueing 10 - 24% of the time  Function - Memory Memory assist level: Recognizes or recalls 75 - 89% of the time/requires cueing 10 - 24% of the time Patient normally able to recall (first 3 days only): Current season, Staff names and faces, That he or she is in a hospital  Medical Problem List and Plan: 1.  Right side weakness, hemisensory loss with dysphagia secondary to left thalamic infarct- cont plavix Cont rehab with tent d/c 5/6,                         2.  DVT Prophylaxis/Anticoagulation: Subcutaneous Lovenox., We'll continue until discharge 3. Pain Management: Ultram 50 mg every 6 hours as needed moderate pain due to arthritic knee and hip             -pace as needed, we will initiate Voltaren gel for right wrist 4. Dysphagia. Dysphagia #2 thin liquid diet. Follow-up speech therapy.intake 100% of meals 5. Neuropsych: This patient is capable of making decisions on his own behalf. 6. Skin/Wound Care: Routine skin checks 7. Fluids/Electrolytes/Nutrition: Routine I&O with follow-up chemistries 8. CAD with stenting/history of PVCs. Low dose Lopressor resumed 12.5 mg twice a day. Follow-up cardiology services. Monitor with increased mobility 9. Diabetes mellitus with peripheral neuropathy. Hemoglobin A1c 5.6. Actos 15 mg daily.reduced to dailyCBG (last 3)   Recent Labs  07/02/15 1633 07/02/15 2042 07/03/15 0640  GLUCAP 123* 120* 109*    10. Hyperlipidemia. Lipitor 11. Renal cyst without other sig pathology, f/u US as outpt 12. Hypoalbuminemia, start pro-stat   13.  Hematuria- UA showing evidence of UTI, Cx multiple species but clinically responding to Keflex, cont 7 day Rx  Filed Vitals:   07/03/15 0511 07/03/15 0934  BP: 133/70 129/74  Pulse: 73 77  Temp: 98.6 F (37 C)   Resp: 17   14. Right knee osteoarthritis posttraumatic Voltaren gel, custom knee orthosis ordered     LOS (Days) 12 A FACE TO FACE EVALUATION WAS  PERFORMED  KIRSTEINS,ANDREW E 07/03/2015, 11:14 AM

## 2015-07-03 NOTE — Progress Notes (Signed)
Physical Therapy Session Note  Patient Details  Name: NAM REISH MRN: [id number] Date of Birth: 27-Oct-1927  Today's Date: 07/03/2015 PT Individual Time: [id number] PT Individual Time Calculation (min): 25 min   Short Term Goals: Week 2:  PT Short Term Goal 1 (Week 2): =LTG due to estimated LOS  Skilled Therapeutic Interventions/Progress Updates:    Pt received in bed & agreeable to PT, denying c/o pain. Pt transferred supine>sitting EOB with use of bed features & PT assisted pt with donning tennis shoes while pt sat EOB. PT provided total A for w/c set up & pt completed squat pivot bed>w/c with use of bed rails & armrests, requiring MinA. Pt transported to rehab apartment via w/c total A for time management. In apartment pt reported he sleeps on L side of bed & PT set pt up by bed. Pt able to complete squat pivot w/c>bed with cuing for hand placement on bed & min A. Pt transferred sitting>supine with light Min A for BLE to position entirely on bed. Pt able to roll L<>R and prone, as well as scoot to center of bed & transfer supine>sitting with mod I. Pt completed squat pivot bed>w/c in same manner as noted above. Attempted to have pt self propel w/c but pt with uncoordinated use of R hand. Pt transported back to room & left in w/c with QRB in place, family present & all needs within reach. PT reviewed use of call bell & pt able to demonstrate which button to push.   Therapy Documentation Precautions:  Precautions Precautions: Fall Precaution Comments: right side weakness, impaired coordination/awareness RUE during transfers is injury risk Restrictions Weight Bearing Restrictions: No Pain: Pain Assessment Pain Assessment: No/denies pain   See Function Navigator for Current Functional Status.   Therapy/Group: Individual Therapy  Waunita Schooner 07/03/2015, 5:44 PM

## 2015-07-04 ENCOUNTER — Inpatient Hospital Stay (HOSPITAL_COMMUNITY): Payer: Medicare Other | Admitting: Physical Therapy

## 2015-07-04 ENCOUNTER — Inpatient Hospital Stay (HOSPITAL_COMMUNITY): Payer: Medicare Other | Admitting: Speech Pathology

## 2015-07-04 ENCOUNTER — Inpatient Hospital Stay (HOSPITAL_COMMUNITY): Payer: Medicare Other | Admitting: Occupational Therapy

## 2015-07-04 ENCOUNTER — Inpatient Hospital Stay (HOSPITAL_COMMUNITY): Payer: Medicare Other

## 2015-07-04 LAB — GLUCOSE, CAPILLARY: Glucose-Capillary: 102 mg/dL — ABNORMAL HIGH (ref 65–99)

## 2015-07-04 NOTE — Progress Notes (Signed)
Speech Language Pathology Daily Session Note  Patient Details  Name: Herbert Marquez MRN: [id number] Date of Birth: [birth year]  Today's Date: 07/04/2015 SLP Individual Time: 1100-1200 SLP Individual Time Calculation (min): 60 min  Short Term Goals: Week 2: SLP Short Term Goal 1 (Week 2): Pt to demonstrate money counting/change making at mod I level. SLP Short Term Goal 2 (Week 2): Pt to demonstrate reading comprehension at the functional level at mod I. SLP Short Term Goal 3 (Week 2): Pt to demonstrate recall of new information with supervision.  SLP Short Term Goal 4 (Week 2): Pt to manage regular consistency snacks at mod I to demonstrate readiness for diet upgrade. SLP Short Term Goal 5 (Week 2): Pt demonstrate completion of medication management task with supervision.   Skilled Therapeutic Interventions:  Pt was seen for skilled ST targeting cognitive goals.  SLP facilitated the session with a re-attempt at medication management with the use of a 2x/day pill box versus 4x/day pill box.  Pt required heavy assist to use a 4x/day pill box during previous therapy session which SLP suspected to be related to its novelty.  With a familiar 2x/day pill box, pt was 100% accurate with mod I for organizing pills of varying frequencies, even with therapist generated distractions in a noisy environment.  Pt also recalled 2 out of 3 rules from a previously taught card game, which improved to 3 out of 3 recall with supervision question cues.  Pt initially required min assist verbal cues to plan and execute a problem solving strategy during the abovementioned task; however, as task progressed therapist was able to fade cues to mod I.  Pt was left in wheelchair with wife at bedside.  Continue per current plan of care.    Function:  Eating Eating   Modified Consistency Diet: Yes Eating Assist Level: Swallowing techniques: self managed   Eating Set Up Assist For: Opening containers        Cognition Comprehension Comprehension assist level: Follows basic conversation/direction with extra time/assistive device  Expression   Expression assist level: Expresses basic needs/ideas: With extra time/assistive device  Social Interaction Social Interaction assist level: Interacts appropriately with others with medication or extra time (anti-anxiety, antidepressant).  Problem Solving Problem solving assist level: Solves basic 90% of the time/requires cueing < 10% of the time  Memory Memory assist level: Recognizes or recalls 75 - 89% of the time/requires cueing 10 - 24% of the time    Pain Pain Assessment Pain Assessment: No/denies pain   Therapy/Group: Individual Therapy  Trine Fread, Selinda Orion 07/04/2015, 12:45 PM

## 2015-07-04 NOTE — Progress Notes (Signed)
Physical Therapy Session Note  Patient Details  Name: Herbert Marquez MRN: [id number] Date of Birth: 12-27-1927  Today's Date: 07/04/2015 PT Individual Time: [id number] PT Individual Time Calculation (min): 30 min   Short Term Goals: Week 2:  PT Short Term Goal 1 (Week 2): =LTG due to estimated LOS  Skilled Therapeutic Interventions/Progress Updates:    Pt received in bed & agreeable to PT. Pt noted 2/10 "catch in back". Pt transferred supine>sitting EOB with supervision, use of bed rails & HOB elevated, and BUE assisted RLE. Once sitting at EOB PT assisted pt with donning socks, shoes, R AFO, and R knee brace all total A. Pt transferred sit>stand with Min A and maximum cuing for hand placement on bed to push up instead of pulling on RW. Pt able to ambulate 15 ft + 15 ft with RW & R hand orthosis. Pt required Min A for ambulation with PT providing manual facilitation to weight shift to L to allow RLE to advance. Pt required maximum cuing to step with RLE, because at times pt would attempt to take 2 steps with LLE and not advance RLE. Pt fatigued quickly & required seated rest break &  Mod assistance to prevent LOB posterior/R & to transfer safely into chair. Pt SOB & after rest break able to ambulate back into room with improved ability to advance RLE with continued weight shifts facilitated by therapist. Pt requested seated rest break on EOB, reporting he was very fatigued. Pt's SpO2 on finger read 78% but on ear read 93 increasing to 98% & HR in 70's. PT educated pt on pursed lip breathing to help reduce feeling of SOB. Encouraged pt to ambulate 1 additional trial but pt fatigued. Pt completed squat pivot bed>w/c with Mod A and cuing for hand placement. Pt left in w/c with QRB in place & all needs within reach.   Therapy Documentation Precautions:  Precautions Precautions: Fall Precaution Comments: right side weakness, impaired coordination/awareness RUE during transfers is injury  risk Restrictions Weight Bearing Restrictions: No Pain: Pain Assessment Pain Assessment: 0-10 Pain Score: 2  Pain Location: Back Pain Intervention(s): Repositioned   See Function Navigator for Current Functional Status.   Therapy/Group: Individual Therapy  Waunita Schooner 07/04/2015, 9:40 AM

## 2015-07-04 NOTE — Progress Notes (Signed)
Subjective/Complaints: Patient is practicing incentive spirometry. Mood and affect appear brighter. Wife is pleased with his progress                                                                                                  Review of systems negative chest pain negative shortness of breath negative nausea negative vomiting negative diarrhea ,+ constipation,  Objective: Vital Signs: Blood pressure 134/71, pulse 77, temperature 98.1 F (36.7 C), temperature source Oral, resp. rate 16, height 5\' 7"  (1.702 m), weight 83.1 kg (183 lb 3.2 oz), SpO2 94 %. No results found. Results for orders placed or performed during the hospital encounter of 06/21/15 (from the past 72 hour(s))  Glucose, capillary     Status: Abnormal   Collection Time: 07/01/15 12:17 PM  Result Value Ref Range   Glucose-Capillary 123 (H) 65 - 99 mg/dL  Glucose, capillary     Status: Abnormal   Collection Time: 07/01/15  5:03 PM  Result Value Ref Range   Glucose-Capillary 103 (H) 65 - 99 mg/dL  Glucose, capillary     Status: Abnormal   Collection Time: 07/01/15  9:02 PM  Result Value Ref Range   Glucose-Capillary 124 (H) 65 - 99 mg/dL   Comment 1 Notify RN   Glucose, capillary     Status: Abnormal   Collection Time: 07/02/15  6:43 AM  Result Value Ref Range   Glucose-Capillary 105 (H) 65 - 99 mg/dL   Comment 1 Notify RN   Glucose, capillary     Status: Abnormal   Collection Time: 07/02/15 11:45 AM  Result Value Ref Range   Glucose-Capillary 117 (H) 65 - 99 mg/dL  Glucose, capillary     Status: Abnormal   Collection Time: 07/02/15  4:33 PM  Result Value Ref Range   Glucose-Capillary 123 (H) 65 - 99 mg/dL  Glucose, capillary     Status: Abnormal   Collection Time: 07/02/15  8:42 PM  Result Value Ref Range   Glucose-Capillary 120 (H) 65 - 99 mg/dL   Comment 1 Notify RN   Glucose, capillary     Status: Abnormal   Collection Time: 07/03/15  6:40 AM  Result Value Ref Range   Glucose-Capillary 109 (H) 65 - 99  mg/dL   Comment 1 Notify RN   Glucose, capillary     Status: Abnormal   Collection Time: 07/04/15  5:23 AM  Result Value Ref Range   Glucose-Capillary 102 (H) 65 - 99 mg/dL     HEENT: normal Cardio: RRR and no murmur Resp: CTA B/L and unlbored GI: BS positive and NT, ND Extremity:  No Edema Skin:   Intact Neuro: Alert/Oriented, Abnormal Sensory absent sensation in the right upper and right lower limb, Normal Motor and Abnormal FMC Ataxic/ dec FMC Musc/Skel:  Other patient has stiffness in the left knee but no evidence of effusion. There is swelling of the right wrist with bony deformity, right knee no effusion but there is joint enlargement Gen. no acute distress   Assessment/Plan: 1. 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 Physiatrist is providing close team supervision and 24 hour management of active medical problems listed below. Physiatrist and rehab team continue to assess barriers to discharge/monitor patient progress toward functional and medical goals. FIM: Function - Bathing Position: Shower Body parts bathed by patient: Right arm, Left arm, Chest, Abdomen, Front perineal area, Right upper leg, Left upper leg, Right lower leg, Left lower leg Body parts bathed by helper: Buttocks, Back Bathing not applicable: Back Assist Level: Touching or steadying assistance(Pt > 75%)  Function- Upper Body Dressing/Undressing What is the patient wearing?: Pull over shirt/dress Pull over shirt/dress - Perfomed by patient: Thread/unthread left sleeve, Put head through opening, Pull shirt over trunk, Thread/unthread right sleeve Pull over shirt/dress - Perfomed by helper: Thread/unthread right sleeve, Pull shirt over trunk Assist Level: Supervision or verbal cues, Set up Set up : To obtain clothing/put away Function - Lower Body  Dressing/Undressing What is the patient wearing?: Pants, Socks, Shoes Position: Wheelchair/chair at sink Underwear - Performed by patient: Thread/unthread right underwear leg, Thread/unthread left underwear leg Underwear - Performed by helper: Pull underwear up/down Pants- Performed by patient: Thread/unthread right pants leg, Thread/unthread left pants leg Pants- Performed by helper: Pull pants up/down Non-skid slipper socks- Performed by patient: Don/doff right sock, Don/doff left sock Socks - Performed by patient: Don/doff right sock, Don/doff left sock Socks - Performed by helper: Don/doff right sock, Don/doff left sock Shoes - Performed by patient: Don/doff right shoe, Don/doff left shoe Shoes - Performed by helper: Don/doff right shoe, Don/doff left shoe, Fasten right, Fasten left TED Hose - Performed by helper: Don/doff right TED hose, Don/doff left TED hose Assist for footwear: Supervision/touching assist Assist for lower body dressing: Touching or steadying assistance (Pt > 75%)  Function - Toileting Toileting activity did not occur: No continent bowel/bladder event Toileting steps completed by patient: Performs perineal hygiene Toileting steps completed by helper: Adjust clothing prior to toileting, Adjust clothing after toileting Toileting Assistive Devices: Grab bar or rail Assist level: Touching or steadying assistance (Pt.75%)  Function - Air cabin crew transfer activity did not occur: N/A Toilet transfer assistive device: Grab bar Assist level to toilet: Moderate assist (Pt 50 - 74%/lift or lower) Assist level from toilet: Moderate assist (Pt 50 - 74%/lift or lower)  Function - Chair/bed transfer Chair/bed transfer method: Squat pivot Chair/bed transfer assist level: Touching or steadying assistance (Pt > 75%) Chair/bed transfer assistive device: Armrests Chair/bed transfer details: Verbal cues for precautions/safety, Verbal cues for technique  Function -  Locomotion: Wheelchair Will patient use wheelchair at discharge?:  (TBD) Max wheelchair distance: 150 Assist Level: Moderate assistance (Pt 50 - 74%) Assist Level: Moderate assistance (Pt 50 - 74%) Assist Level: Moderate assistance (Pt 50 - 74%) Turns around,maneuvers to table,bed, and toilet,negotiates 3% grade,maneuvers on rugs and over doorsills: No Function - Locomotion: Ambulation Assistive device: Walker-rolling, Orthosis (RLE GRAFO, knee brace, hand splint) Max distance: 30 Assist level: 2 helpers (minA, w/c follow) Assist level: 2 helpers (minA, w/c follow) Walk 50 feet with 2 turns activity did not occur: Safety/medical concerns Walk 150 feet activity did not occur: Safety/medical concerns Walk 10 feet on uneven surfaces activity did not occur: Safety/medical concerns  Function - Comprehension Comprehension: Auditory Comprehension assistive device: Hearing aids Comprehension assist level: Follows basic conversation/direction with extra time/assistive device  Function - Expression Expression: Verbal Expression assist level: Expresses basic needs/ideas: With extra time/assistive device  Function - Social Interaction Social  Interaction assist level: Interacts appropriately with others with medication or extra time (anti-anxiety, antidepressant).  Function - Problem Solving Problem solving assist level: Solves basic 75 - 89% of the time/requires cueing 10 - 24% of the time  Function - Memory Memory assist level: Recognizes or recalls 75 - 89% of the time/requires cueing 10 - 24% of the time Patient normally able to recall (first 3 days only): Current season, Staff names and faces, That he or she is in a hospital  Medical Problem List and Plan: 1.  Right side weakness, hemisensory loss with dysphagia secondary to left thalamic infarct- cont plavix Cont rehab, PT, OT , SLP with tent d/c 5/6,                                 2.  DVT Prophylaxis/Anticoagulation: Subcutaneous  Lovenox.,  3. Pain Management: Ultram 50 mg every 6 hours as needed moderate pain due to arthritic knee and hip             -pace as needed, we will initiate Voltaren gel for right wrist 4. Dysphagia. Dysphagia #2 thin liquid diet. Follow-up speech therapy.intake 100% of meals 5. Neuropsych: This patient is capable of making decisions on his own behalf. 6. Skin/Wound Care: Routine skin checks 7. Fluids/Electrolytes/Nutrition: Routine I&O with follow-up chemistries 8. CAD with stenting/history of PVCs. Low dose Lopressor resumed 12.5 mg twice a day. Follow-up cardiology services. Monitor with increased mobility 9. Diabetes mellitus with peripheral neuropathy. Hemoglobin A1c 5.6. Actos 15 mg daily.reduced to dailyCBG (last 3)   Recent Labs  07/02/15 2042 07/03/15 0640 07/04/15 0523  GLUCAP 120* 109* 102*    10. Hyperlipidemia. Lipitor 11. Renal cyst without other sig pathology, f/u US as outpt 12. Hypoalbuminemia, start pro-stat   13.  Hematuria- UA showing evidence of UTI, Cx multiple species but clinically responding to Keflex, finishe Rx today  Filed Vitals:   07/04/15 0527 07/04/15 0813  BP: 130/76 134/71  Pulse: 73 77  Temp: 98.1 F (36.7 C)   Resp: 16   14. Right knee osteoarthritis posttraumatic Voltaren gel, custom knee orthosis hopefully to get measured today     LOS (Days) 13 A FACE TO FACE EVALUATION WAS PERFORMED  , E 07/04/2015, 8:43 AM

## 2015-07-04 NOTE — Progress Notes (Signed)
Occupational Therapy Session Note  Patient Details  Name: Herbert Marquez MRN: [id number] Date of Birth: Feb 05, 1928  Today's Date: 07/04/2015 OT Individual Time:  -   1000-1100  (60 min)      Short Term Goals: Week 1:  OT Short Term Goal 1 (Week 1): Pt will complete bathing with min assist OT Short Term Goal 1 - Progress (Week 1): Met OT Short Term Goal 2 (Week 1): Pt will complete LB dressing with min assist for standing balance OT Short Term Goal 2 - Progress (Week 1): Progressing toward goal OT Short Term Goal 3 (Week 1): Pt will complete toilet transfer with min assist OT Short Term Goal 3 - Progress (Week 1): Progressing toward goal OT Short Term Goal 4 (Week 1): Pt will complete 1 grooming task in standing with min assist for standing balance OT Short Term Goal 4 - Progress (Week 1): Progressing toward goal OT Short Term Goal 5 (Week 1): Pt will visually attend to RUE during grooming tasks with min cues to increase safety due to impaired sensation and proprioception OT Short Term Goal 5 - Progress (Week 1): Met  Skilled Therapeutic Interventions/Progress Updates:    Pt sitting in wc with  SOB from previous session.  He reported he worked hard and was tired and cold.  Pt agreed to shower.  Pt able to doff shirt and left shoe.  Needed total assist with right leg brace.  Pt was mod assist with wc mobility.  Provided cues for awareness of RUE so as to not scrap it.  Pt  Transferred to the shower bench with mod assist plus cues for proper technique and anticipatory awareness of motor techniques.  Pt was min assist with sit to stand for peri care.  He transferred back to wc and dressed self with OT assisting with tying shoes and pulling up pants.  Pt groomed at sink in sitting.  Pt left with next therapist.   Therapy Documentation Precautions:  Precautions Precautions: Fall Precaution Comments: right side weakness, impaired coordination/awareness RUE during transfers is injury  risk Restrictions Weight Bearing Restrictions: No    Vital Signs: Therapy Vitals Pulse Rate: 77 BP: 134/71 mmHg Pain: Pain Assessment Pain Assessment: 0-10 Pain Score: 2  Pain Location: Back Pain Intervention(s): Repositioned          See Function Navigator for Current Functional Status.   Therapy/Group: Individual Therapy  Lisa Roca 07/04/2015, 11:27 AM

## 2015-07-04 NOTE — Progress Notes (Signed)
Physical Therapy Session Note  Patient Details  Name: Herbert Marquez MRN: [id number] Date of Birth: 01-Oct-1927  Today's Date: 07/04/2015 PT Individual Time: 1515-1530 PT Individual Time Calculation (min): 15 min  Concurrent time: 1445-1515, 30 min  Short Term Goals: Week 2:  PT Short Term Goal 1 (Week 2): =LTG due to estimated LOS  Skilled Therapeutic Interventions/Progress Updates:  Concurrent tx: neuromuscular re-education via forced use, visual cue, VCs for R hip neutral rotation and trunk rotation during NuSTep at level 4 x 5 min x 2.   PT instructed wife in donning R knee brace and RAFO.  Wife needs more training.    Individual tx: Gait with RW, orthoses x 20' x 2, including turns.  Assistance varied due to ataxia and difficulty wt shifting L, and progression of RW safely.  Each trial ended due to pt's R knee buckling.   Wife pushed pt back to room in w/c; PT instructed her to ask staff for help if he wanted to get back into bed, and inform staff if she leaves room.    Therapy Documentation Precautions:  Precautions Precautions: Fall Precaution Comments: right side weakness, impaired coordination/awareness RUE during transfers is injury risk Restrictions Weight Bearing Restrictions: No Pain: Pain Assessment Pain Assessment: No/denies pain      See Function Navigator for Current Functional Status.   Therapy/Group: Individual Therapy  Quanell Loughney 07/04/2015, 3:48 PM

## 2015-07-05 ENCOUNTER — Inpatient Hospital Stay (HOSPITAL_COMMUNITY): Payer: Medicare Other | Admitting: Occupational Therapy

## 2015-07-05 ENCOUNTER — Inpatient Hospital Stay (HOSPITAL_COMMUNITY): Payer: Medicare Other | Admitting: Physical Therapy

## 2015-07-05 ENCOUNTER — Inpatient Hospital Stay (HOSPITAL_COMMUNITY): Payer: Medicare Other | Admitting: Speech Pathology

## 2015-07-05 LAB — CREATININE, SERUM
Creatinine, Ser: 1 mg/dL (ref 0.61–1.24)
GFR calc Af Amer: >60 mL/min (ref >60)
GFR calc non Af Amer: >60 mL/min (ref >60)

## 2015-07-05 LAB — GLUCOSE, CAPILLARY: Glucose-Capillary: 109 mg/dL — ABNORMAL HIGH (ref 65–99)

## 2015-07-05 NOTE — Progress Notes (Signed)
Physical Therapy Session Note  Patient Details  Name: Herbert Marquez MRN: [id number] Date of Birth: 1927/04/26  Today's Date: 07/05/2015 PT Individual Time: 1300-1415 PT Individual Time Calculation (min): 75 min   Short Term Goals: Week 2:  PT Short Term Goal 1 (Week 2): =LTG due to estimated LOS  Skilled Therapeutic Interventions/Progress Updates:    Pt received seated in w/c; denies pain and agreeable to treatment. Shoes and RLE AFO, knee brace donned totalA for time management. Squat pivot transfer w/c <>bed in ADL apartment with pt and wife problem solving for w/c setup; ultimately required min verbal cues for setup. Pt then performed transfer to/from bed x2 trials (4 total transfers) with S. As with bed, pt and wife performed w/c <>car transfer with min verbal cues for setup and S for transfer. Gait 2x30' and 1x20' with RW and minA. W/c propulsion x100' with BUE and minA, verbal cues for R hand placement. Sit <>stand and toe taps to 4" step using RLE to facilitate L weight shift and R foot clearance. Returned to room and performed stand pivot transfer minA to toilet. Pt performed hygiene and clothing management with S. Did not have bowel movement or urinate at this time. Squat pivot with S to return to bed. Remained supine in bed at completion of session, all needs within reach.   Therapy Documentation Precautions:  Precautions Precautions: Fall Precaution Comments: right side weakness, impaired coordination/awareness RUE during transfers is injury risk Restrictions Weight Bearing Restrictions: No Pain: Pain Assessment Pain Assessment: No/denies pain   See Function Navigator for Current Functional Status.   Therapy/Group: Individual Therapy  Luberta Mutter 07/05/2015, 3:56 PM

## 2015-07-05 NOTE — Progress Notes (Signed)
Subjective/Complaints:  wife is not comfortable with transfers she really has not tried these yet. She has a bad back and cannot provide physical assistance.                                                                                         Review of systems negative chest pain negative shortness of breath negative nausea negative vomiting negative diarrhea ,+ constipation,  Objective: Vital Signs: Blood pressure 144/64, pulse 72, temperature 98.2 F (36.8 C), temperature source Oral, resp. rate 18, height 5' 7"  (1.702 m), weight 82.9 kg (182 lb 12.2 oz), SpO2 96 %. No results found. Results for orders placed or performed during the hospital encounter of 06/21/15 (from the past 72 hour(s))  Glucose, capillary     Status: Abnormal   Collection Time: 07/02/15 11:45 AM  Result Value Ref Range   Glucose-Capillary 117 (H) 65 - 99 mg/dL  Glucose, capillary     Status: Abnormal   Collection Time: 07/02/15  4:33 PM  Result Value Ref Range   Glucose-Capillary 123 (H) 65 - 99 mg/dL  Glucose, capillary     Status: Abnormal   Collection Time: 07/02/15  8:42 PM  Result Value Ref Range   Glucose-Capillary 120 (H) 65 - 99 mg/dL   Comment 1 Notify RN   Glucose, capillary     Status: Abnormal   Collection Time: 07/03/15  6:40 AM  Result Value Ref Range   Glucose-Capillary 109 (H) 65 - 99 mg/dL   Comment 1 Notify RN   Glucose, capillary     Status: Abnormal   Collection Time: 07/04/15  5:23 AM  Result Value Ref Range   Glucose-Capillary 102 (H) 65 - 99 mg/dL  Glucose, capillary     Status: Abnormal   Collection Time: 07/05/15  5:15 AM  Result Value Ref Range   Glucose-Capillary 109 (H) 65 - 99 mg/dL  Creatinine, serum     Status: None   Collection Time: 07/05/15  6:15 AM  Result Value Ref Range   Creatinine, Ser 1.00 0.61 - 1.24 mg/dL   GFR calc non Af Amer >60 >60 mL/min   GFR calc Af Amer >60 >60 mL/min    Comment: (NOTE) The eGFR has been calculated using the CKD EPI equation. This  calculation has not been validated in all clinical situations. eGFR's persistently <60 mL/min signify possible Chronic Kidney Disease.      HEENT: normal Cardio: RRR and no murmur Resp: CTA B/L and unlbored GI: BS positive and NT, ND Extremity:  No Edema Skin:   Intact Neuro: Alert/Oriented, Abnormal Sensory absent sensation in the right upper and right lower limb, Normal Motor and Abnormal FMC Ataxic/ dec FMC Musc/Skel:  Other patient has stiffness in the left knee but no evidence of effusion. There is swelling of the right wrist with bony deformity, right knee no effusion but there is joint enlargement Gen. no acute distress   Assessment/Plan: 1. 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 Physiatrist is providing close team supervision and 24 hour  management of active medical problems listed below. Physiatrist and rehab team continue to assess barriers to discharge/monitor patient progress toward functional and medical goals. FIM: Function - Bathing Position: Shower Body parts bathed by patient: Right arm, Left arm, Chest, Abdomen, Front perineal area, Right upper leg, Left upper leg, Right lower leg, Left lower leg, Buttocks Body parts bathed by helper: Back Bathing not applicable: Back Assist Level: Touching or steadying assistance(Pt > 75%)  Function- Upper Body Dressing/Undressing What is the patient wearing?: Pull over shirt/dress Pull over shirt/dress - Perfomed by patient: Thread/unthread left sleeve, Put head through opening, Pull shirt over trunk, Thread/unthread right sleeve Pull over shirt/dress - Perfomed by helper: Thread/unthread right sleeve, Pull shirt over trunk Assist Level: Supervision or verbal cues, Set up Set up : To obtain clothing/put away Function - Lower Body Dressing/Undressing What is the patient wearing?:  Pants, Socks, Shoes Position: Wheelchair/chair at sink Underwear - Performed by patient: Thread/unthread right underwear leg, Thread/unthread left underwear leg Underwear - Performed by helper: Pull underwear up/down Pants- Performed by patient: Thread/unthread right pants leg, Thread/unthread left pants leg Pants- Performed by helper: Pull pants up/down Non-skid slipper socks- Performed by patient: Don/doff right sock, Don/doff left sock Socks - Performed by patient: Don/doff right sock, Don/doff left sock Socks - Performed by helper: Don/doff right sock, Don/doff left sock Shoes - Performed by patient: Don/doff right shoe, Don/doff left shoe Shoes - Performed by helper: Fasten left, Fasten right TED Hose - Performed by helper: Don/doff right TED hose, Don/doff left TED hose Assist for footwear: Supervision/touching assist Assist for lower body dressing: Touching or steadying assistance (Pt > 75%)  Function - Toileting Toileting activity did not occur: No continent bowel/bladder event Toileting steps completed by patient: Performs perineal hygiene Toileting steps completed by helper: Adjust clothing prior to toileting, Adjust clothing after toileting Toileting Assistive Devices: Grab bar or rail Assist level: Touching or steadying assistance (Pt.75%)  Function - Air cabin crew transfer activity did not occur: N/A Toilet transfer assistive device: Grab bar Assist level to toilet: Moderate assist (Pt 50 - 74%/lift or lower) Assist level from toilet: Moderate assist (Pt 50 - 74%/lift or lower)  Function - Chair/bed transfer Chair/bed transfer method: Squat pivot Chair/bed transfer assist level: Maximal assist (Pt 25 - 49%/lift and lower) Chair/bed transfer assistive device: Armrests Chair/bed transfer details: Verbal cues for technique, Verbal cues for sequencing, Manual facilitation for weight shifting  Function - Locomotion: Wheelchair Will patient use wheelchair at  discharge?:  (TBD) Max wheelchair distance: 150 Assist Level: Moderate assistance (Pt 50 - 74%) Assist Level: Moderate assistance (Pt 50 - 74%) Assist Level: Moderate assistance (Pt 50 - 74%) Turns around,maneuvers to table,bed, and toilet,negotiates 3% grade,maneuvers on rugs and over doorsills: No Function - Locomotion: Ambulation Assistive device: Walker-rolling, Orthosis (R knee brace and RAFO) Max distance: 20 Assist level: Maximal assist (Pt 25 - 49%) Assist level: Maximal assist (Pt 25 - 49%) Walk 50 feet with 2 turns activity did not occur: Safety/medical concerns Walk 150 feet activity did not occur: Safety/medical concerns Walk 10 feet on uneven surfaces activity did not occur: Safety/medical concerns  Function - Comprehension Comprehension: Auditory Comprehension assistive device: Hearing aids Comprehension assist level: Follows basic conversation/direction with extra time/assistive device  Function - Expression Expression: Verbal Expression assist level: Expresses basic needs/ideas: With extra time/assistive device  Function - Social Interaction Social Interaction assist level: Interacts appropriately with others with medication or extra time (anti-anxiety, antidepressant).  Function - Problem Solving Problem solving  assist level: Solves basic 90% of the time/requires cueing < 10% of the time  Function - Memory Memory assist level: Recognizes or recalls 75 - 89% of the time/requires cueing 10 - 24% of the time Patient normally able to recall (first 3 days only): Current season, Staff names and faces, That he or she is in a hospital  Medical Problem List and Plan: 1.  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 Hopefully he will be at a supervision level                               2.  DVT Prophylaxis/Anticoagulation:  Subcutaneous Lovenox resumed no evidence of further hematuria 3. Pain Management: Ultram 50 mg every 6 hours as needed moderate pain due to arthritic knee and hip             -pace as needed, we will initiate Voltaren gel for right wrist 4. Dysphagia. Dysphagia #2 thin liquid diet. Follow-up speech therapy.intake 100% of meals 5. Neuropsych: This patient is capable of making decisions on his own behalf. 6. Skin/Wound Care: Routine skin checks 7. Fluids/Electrolytes/Nutrition: Routine I&O with follow-up chemistries 8. CAD with stenting/history of PVCs. Low dose Lopressor resumed 12.5 mg twice a day. Follow-up cardiology services. Monitor with increased mobility 9. Diabetes mellitus with peripheral neuropathy. Hemoglobin A1c 5.6. Actos 15 mg daily.reduced to dailyCBG (last 3)   Recent Labs  07/03/15 0640 07/04/15 0523 07/05/15 0515  GLUCAP 109* 102* 109*    10. Hyperlipidemia. Lipitor 11. Renal cyst without other sig pathology, f/u US as outpt 12. Hypoalbuminemia, start pro-stat   13.  Hematuria- UA showing evidence of UTI, Cx multiple species but clinically responding to Keflex, finished Rx today  Filed Vitals:   07/04/15 2041 07/05/15 0519  BP: 131/66 144/64  Pulse: 74 72  Temp:  98.2 F (36.8 C)  Resp:  18  14. Right knee osteoarthritis posttraumatic Voltaren gel, custom knee orthosis , Hope to receive this prior to discharge     LOS (Days) 14 A FACE TO FACE EVALUATION WAS PERFORMED  KIRSTEINS,ANDREW E 07/05/2015, 7:52 AM

## 2015-07-05 NOTE — Progress Notes (Signed)
Occupational Therapy Session Note  Patient Details  Name: Herbert Marquez MRN: [id number] Date of Birth: Jun 13, 1927  Today's Date: 07/05/2015 OT Individual Time: 1130-1206 OT Individual Time Calculation (min): 36 min    Short Term Goals: Week 1:  OT Short Term Goal 1 (Week 1): Pt will complete bathing with min assist OT Short Term Goal 1 - Progress (Week 1): Met OT Short Term Goal 2 (Week 1): Pt will complete LB dressing with min assist for standing balance OT Short Term Goal 2 - Progress (Week 1): Progressing toward goal OT Short Term Goal 3 (Week 1): Pt will complete toilet transfer with min assist OT Short Term Goal 3 - Progress (Week 1): Progressing toward goal OT Short Term Goal 4 (Week 1): Pt will complete 1 grooming task in standing with min assist for standing balance OT Short Term Goal 4 - Progress (Week 1): Progressing toward goal OT Short Term Goal 5 (Week 1): Pt will visually attend to RUE during grooming tasks with min cues to increase safety due to impaired sensation and proprioception OT Short Term Goal 5 - Progress (Week 1): Met Week 2:  OT Short Term Goal 1 (Week 2): Pt will complete bathing with min assist for standing balance OT Short Term Goal 2 (Week 2): Pt will complete LB dressing with min assist for standing balance OT Short Term Goal 3 (Week 2): Pt will complete toilet transfer with min assist OT Short Term Goal 4 (Week 2): Pt will complete 1 grooming task in standing with min assist for standing balance      Skilled Therapeutic Interventions/Progress Updates:    Pt seen for skilled OT to facilitate dynamic standing balance and RUE coordination. In room pt donned shoes with min A to fix back of shoes and tie them. Transported to therapy room. Pt cued to use his eyes to focus on his R hand during session. Pt stood to end of stair ramp with B hands on rails. Practiced sliding R hand up and down rail 20x maintaining control of hand. Pt did well and did not drop his  hand. In standing, his tolerance is only a few minutes at a time.  Pt worked on wt shifting onto R leg and tapping R leg up and down step . He can not support his wt on R leg only to tap L foot up and down.  Pt stood 3x for 2-3 min at a time to work on standing balance ex.  In sitting, R hand coordination with BUE on dowel bar for AROM and grasping/releasing beanbags at various shoulder heights and tossing into bucket. With eyes focused on hand, pt had 90% accuracy with bean bags and good control over dowel bar. Pt taken back to room so he could wash his hands before lunch.  Therapy Documentation Precautions:  Precautions Precautions: Fall Precaution Comments: right side weakness, impaired coordination/awareness RUE during transfers is injury risk Restrictions Weight Bearing Restrictions: No    Pain: Pain Assessment Pain Assessment: No/denies pain ADL:   See Function Navigator for Current Functional Status.   Therapy/Group: Individual Therapy  [place] 07/05/2015, 12:59 PM

## 2015-07-05 NOTE — Progress Notes (Signed)
Occupational Therapy Session Note  Patient Details  Name: Herbert Marquez MRN: [id number] Date of Birth: 1927-11-20  Today's Date: 07/05/2015 OT Individual Time: [id number] Individual Time Calculation (min): 45 min    Short Term Goals: Week 2:  OT Short Term Goal 1 (Week 2): Pt will complete bathing with min assist for standing balance OT Short Term Goal 2 (Week 2): Pt will complete LB dressing with min assist for standing balance OT Short Term Goal 3 (Week 2): Pt will complete toilet transfer with min assist OT Short Term Goal 4 (Week 2): Pt will complete 1 grooming task in standing with min assist for standing balance  Skilled Therapeutic Interventions/Progress Updates:    Treatment session with focus on functional transfers, sit > stand, and functional use of RUE during self-care tasks and therapeutic activities.  Pt requesting to complete shower this session.  Completed stand pivot transfer into room shower with use of grab bars and min assist.  Discussed with wife setup of bathroom and encouraged measurements and pictures to allow further discussion of equipment and bathroom transfers.  Pt demonstrated increased awareness of Rt during bathing and positioning during self-care tasks.  Improved standing balance with min assist during washing buttocks and pulling pants over hips.  Engaged in standing task in therapy gym with focus on weight bearing through RUE while reaching across midline to gather items to challenge balance and weight bearing.  Completed gross and fine motor activity with reaching for pegs with Rt hand and placing them in peg board, pt required increased time due to decreased motor control with RUE and min assist to maintain standing balance.  Therapy Documentation Precautions:  Precautions Precautions: Fall Precaution Comments: right side weakness, impaired coordination/awareness RUE during transfers is injury risk Restrictions Weight Bearing Restrictions:  No Pain: Pain Assessment Pain Assessment: No/denies pain  See Function Navigator for Current Functional Status.   Therapy/Group: Individual Therapy  [name] 07/05/2015, 10:34 AM

## 2015-07-05 NOTE — Progress Notes (Signed)
Speech Language Pathology Daily Session Note  Patient Details  Name: Herbert Marquez MRN: [id number] Date of Birth: [birth year]  Today's Date: 07/05/2015 SLP Individual Time: 1000-1100 SLP Individual Time Calculation (min): 60 min  Short Term Goals: Week 2: SLP Short Term Goal 1 (Week 2): Pt to demonstrate money counting/change making at mod I level. SLP Short Term Goal 2 (Week 2): Pt to demonstrate reading comprehension at the functional level at mod I. SLP Short Term Goal 3 (Week 2): Pt to demonstrate recall of new information with supervision.  SLP Short Term Goal 4 (Week 2): Pt to manage regular consistency snacks at mod I to demonstrate readiness for diet upgrade. SLP Short Term Goal 4 - Progress (Week 2): Discontinued (comment) (due to pt preference ) SLP Short Term Goal 5 (Week 2): Pt demonstrate completion of medication management task with supervision.   Skilled Therapeutic Interventions:  Pt was seen for skilled ST targeting cognitive goals.  SLP facilitated the session with money management and functional math tasks to address semi-complex functional problem solving.  Pt was able to balance a checkbook with the use of a calculator for 90% accuracy with supervision verbal cues to recognize and correct errors.  Pt was also able to complete functional mental math calculations from word problems for 100% accuracy with mod I.  Pt required min assist verbal cues for organization and working memory during a deductive reasoning puzzle.  SLP will plan to complete puzzle (checking for accuracy) with pt at next available appointment.  Pt was returned to room and left in wheelchair with wife at bedside.  Continue per current plan of care.   Function:  Eating Eating               Cognition Comprehension Comprehension assist level: Follows complex conversation/direction with extra time/assistive device  Expression   Expression assist level: Expresses complex ideas: With extra  time/assistive device  Social Interaction Social Interaction assist level: Interacts appropriately with others - No medications needed.  Problem Solving Problem solving assist level: Solves basic 90% of the time/requires cueing < 10% of the time  Memory Memory assist level: Recognizes or recalls 75 - 89% of the time/requires cueing 10 - 24% of the time    Pain Pain Assessment Pain Assessment: No/denies pain  Therapy/Group: Individual Therapy  Herbert Marquez, Selinda Orion 07/05/2015, 12:41 PM

## 2015-07-06 ENCOUNTER — Inpatient Hospital Stay (HOSPITAL_COMMUNITY): Payer: Medicare Other | Admitting: Occupational Therapy

## 2015-07-06 ENCOUNTER — Inpatient Hospital Stay (HOSPITAL_COMMUNITY): Payer: Medicare Other | Admitting: Physical Therapy

## 2015-07-06 ENCOUNTER — Inpatient Hospital Stay (HOSPITAL_COMMUNITY): Payer: Medicare Other | Admitting: Speech Pathology

## 2015-07-06 LAB — GLUCOSE, CAPILLARY: Glucose-Capillary: 88 mg/dL (ref 65–99)

## 2015-07-06 NOTE — Progress Notes (Signed)
Speech Language Pathology Daily Session Note  Patient Details  Name: MERYLE PACIFICO MRN: [id number] Date of Birth: 1927/03/21  Today's Date: 07/06/2015 SLP Individual Time: [id number] SLP Individual Time Calculation (min): 25 min  Short Term Goals: Week 2: SLP Short Term Goal 1 (Week 2): Pt to demonstrate money counting/change making at mod I level. SLP Short Term Goal 2 (Week 2): Pt to demonstrate reading comprehension at the functional level at mod I. SLP Short Term Goal 3 (Week 2): Pt to demonstrate recall of new information with supervision.  SLP Short Term Goal 4 (Week 2): Pt to manage regular consistency snacks at mod I to demonstrate readiness for diet upgrade. SLP Short Term Goal 4 - Progress (Week 2): Discontinued (comment) (due to pt preference ) SLP Short Term Goal 5 (Week 2): Pt demonstrate completion of medication management task with supervision.   Skilled Therapeutic Interventions:  Pt was seen for skilled ST targeting cognitive goals.  Pt was able to recognize 1 out of 3 errors from yesterday's deductive reasoning puzzle without assistance; however, he required mod-max assist verbal cues to recognize and correct the other 2 errors due to working memory impairment.  Pt was left in wheelchair with wife at bedside.  Continue per current plan of care.    Function:  Eating Eating                 Cognition Comprehension Comprehension assist level: Follows complex conversation/direction with extra time/assistive device  Expression   Expression assist level: Expresses complex ideas: With extra time/assistive device  Social Interaction Social Interaction assist level: Interacts appropriately with others - No medications needed.  Problem Solving Problem solving assist level: Solves basic 90% of the time/requires cueing < 10% of the time  Memory Memory assist level: Recognizes or recalls 75 - 89% of the time/requires cueing 10 - 24% of the time    Pain Pain  Assessment Pain Assessment: No/denies pain  Therapy/Group: Individual Therapy  Jerad Dunlap, Selinda Orion 07/06/2015, 12:34 PM

## 2015-07-06 NOTE — Progress Notes (Signed)
Occupational Therapy Weekly Progress Note  Patient Details  Name: Herbert Marquez MRN: [id number] Date of Birth: October 20, 1927  Beginning of progress report period: June 29, 2015 End of progress report period: Jul 06, 2015  Today's Date: 07/06/2015 OT Individual Time: 9735-3299 and 2426-8341 OT Individual Time Calculation (min): 43 min and 30 min   Patient has met 4 of 4 short term goals.  Pt is making steady progress towards goals.  Pt currently requires min assist for stand pivot transfers with UE support on grab bar to toilet or shower and min assist for standing balance due to RLE weakness and knee instability.  PT has fitted pt with knee brace and AFO to improve standing and ambulation which should assist with ADLs, however unable to wear during bathing and dressing tasks.  Pt continues to demonstrate improved motor control in RUE, improved when visually attending to hand and task.  Pt's wife is unable to provide any physical assist due to bad back, therefore pt will benefit from continued OT services to improve to supervision level overall.  Patient continues to demonstrate the following deficits: RUE ataxia and impaired sensation and proprioception, RLE weakness and knee instability, impaired sitting and standing balance, and overall activity tolerance and therefore will continue to benefit from skilled OT intervention to enhance overall performance with BADL and Reduce care partner burden.  Patient progressing toward long term goals..  Continue plan of care.  OT Short Term Goals Week 2:  OT Short Term Goal 1 (Week 2): Pt will complete bathing with min assist for standing balance OT Short Term Goal 1 - Progress (Week 2): Met OT Short Term Goal 2 (Week 2): Pt will complete LB dressing with min assist for standing balance OT Short Term Goal 2 - Progress (Week 2): Met OT Short Term Goal 3 (Week 2): Pt will complete toilet transfer with min assist OT Short Term Goal 3 - Progress (Week 2):  Met OT Short Term Goal 4 (Week 2): Pt will complete 1 grooming task in standing with min assist for standing balance OT Short Term Goal 4 - Progress (Week 2): Met Week 3:  OT Short Term Goal 1 (Week 3): STG = LTGs due to remaining LOS  Skilled Therapeutic Interventions/Progress Updates:    1) Treatment session with focus on functional transfers and family education.  Pt's wife present with pictures and measurements of bathroom.  Engaged in transfer training with focus on bed, w/c, toilet, and tub bench transfers.  Pt required min assist initially bed > w/c due to quick movements, able to progress to supervision during 2nd trial.  Completed transfer on/off toilet from w/c x2 with use of grab bars and initial mod assist to toilet as pt overshot toilet, progressing to min assist back to w/c and then on/off toilet.  Discussed obtaining grab bar for next to toilet to assist with transfer and increase safety.  Also engaged in discussion of use of BSC if bathroom not accessible (26" doorway).  Problem solved walk-in shower transfer with plan to complete lateral scoot w/c > tub bench over walk-in shower entrance with pt able to complete transfer with min/steady assist, question if w/c positioning able to access shower as pt's bathroom very small.  If w/c unable to fit, encouraged further discussion with HHOT.  2) Treatment session with focus on RUE and trunk NMR.  Engaged in dynamic sitting activity with focus on trunk control while addressing RUE motor control with reaching in various planes.  Min cues  to visually attend to RUE with reaching to improve control and precision of movement.  Progressed to standing activity with pt able to complete sit > stand with light min assist and tactile cues at Rt knee to promote upright standing and knee extension.  [place] task in standing with focus on motor control and coordination with placing small animal figurines into standing position on table.  Returned to w/c and then to  bed with supervision/setup cues.  Therapy Documentation Precautions:  Precautions Precautions: Fall Precaution Comments: right side weakness, impaired coordination/awareness RUE during transfers is injury risk Restrictions Weight Bearing Restrictions: No General:   Vital Signs: Therapy Vitals Temp: 98.6 F (37 C) Temp Source: Oral Pulse Rate: 74 Resp: 16 BP: 131/68 mmHg Patient Position (if appropriate): Lying Oxygen Therapy SpO2: 96 % O2 Device: Not Delivered Pain:  Pt with no c/o pain  See Function Navigator for Current Functional Status.   Therapy/Group: Individual Therapy  [name] 07/06/2015, 7:50 AM

## 2015-07-06 NOTE — Progress Notes (Signed)
Orthopedic Tech Progress Note Patient Details:  [name] 11-06-1927 [id number]  Patient ID: Herbert Marquez, male   DOB: 08/14/1927, 82 y.o.   MRN: [id number] Called in advanced brace order; spoke with Dyann Kief, Nyjah Denio 07/06/2015, 11:25 AM

## 2015-07-06 NOTE — Progress Notes (Signed)
Subjective/Complaints: Patient had the best night of sleep  Thus far last night                                                                                      Review of systems negative chest pain negative shortness of breath negative nausea negative vomiting negative diarrhea ,+ constipation,  Objective: Vital Signs: Blood pressure 131/68, pulse 74, temperature 98.6 F (37 C), temperature source Oral, resp. rate 16, height 5' 7"  (1.702 m), weight 83.4 kg (183 lb 13.8 oz), SpO2 96 %. No results found. Results for orders placed or performed during the hospital encounter of 06/21/15 (from the past 72 hour(s))  Glucose, capillary     Status: Abnormal   Collection Time: 07/04/15  5:23 AM  Result Value Ref Range   Glucose-Capillary 102 (H) 65 - 99 mg/dL  Glucose, capillary     Status: Abnormal   Collection Time: 07/05/15  5:15 AM  Result Value Ref Range   Glucose-Capillary 109 (H) 65 - 99 mg/dL  Creatinine, serum     Status: None   Collection Time: 07/05/15  6:15 AM  Result Value Ref Range   Creatinine, Ser 1.00 0.61 - 1.24 mg/dL   GFR calc non Af Amer >60 >60 mL/min   GFR calc Af Amer >60 >60 mL/min    Comment: (NOTE) The eGFR has been calculated using the CKD EPI equation. This calculation has not been validated in all clinical situations. eGFR's persistently <60 mL/min signify possible Chronic Kidney Disease.   Glucose, capillary     Status: None   Collection Time: 07/06/15  5:28 AM  Result Value Ref Range   Glucose-Capillary 88 65 - 99 mg/dL     HEENT: normal Cardio: RRR and no murmur Resp: CTA B/L and unlbored GI: BS positive and NT, ND Extremity:  No Edema Skin:   Intact Neuro: Alert/Oriented, Abnormal Sensory absent sensation in the right upper and right lower limb, Normal Motor and Abnormal FMC Ataxic/ dec FMC, Right upper extremity ataxia Musc/Skel:  Other patient has stiffness in the left knee but no evidence of effusion. There is swelling of the right wrist with  bony deformity, right knee no effusion but there is joint enlargement Gen. no acute distress   Assessment/Plan: 1. 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 Physiatrist is providing close team supervision and 24 hour management of active medical problems listed below. Physiatrist and rehab team continue to assess barriers to discharge/monitor patient progress toward functional and medical goals. FIM: Function - Bathing Position: Shower Body parts bathed by patient: Right arm, Left arm, Chest, Abdomen, Front perineal area, Right upper leg, Left upper leg, Right lower leg, Left lower leg, Buttocks Body parts bathed by helper: Back Bathing not applicable: Back Assist Level: Touching or steadying assistance(Pt > 75%)  Function- Upper Body Dressing/Undressing What is the patient wearing?: Pull over shirt/dress Pull over shirt/dress - Perfomed by patient: Thread/unthread left sleeve, Put head through opening, Pull shirt over trunk, Thread/unthread right sleeve Pull over shirt/dress - Perfomed by helper: Thread/unthread right  sleeve, Pull shirt over trunk Assist Level: Supervision or verbal cues, Set up Set up : To obtain clothing/put away Function - Lower Body Dressing/Undressing What is the patient wearing?: Pants, Socks, Shoes Position: Wheelchair/chair at sink Underwear - Performed by patient: Thread/unthread right underwear leg, Thread/unthread left underwear leg Underwear - Performed by helper: Pull underwear up/down Pants- Performed by patient: Thread/unthread right pants leg, Thread/unthread left pants leg, Pull pants up/down Pants- Performed by helper: Pull pants up/down Non-skid slipper socks- Performed by patient: Don/doff right sock, Don/doff left sock Socks - Performed by patient: Don/doff right sock, Don/doff left sock Socks - Performed  by helper: Don/doff right sock, Don/doff left sock Shoes - Performed by patient: Don/doff right shoe, Don/doff left shoe Shoes - Performed by helper: Fasten left, Fasten right TED Hose - Performed by helper: Don/doff right TED hose, Don/doff left TED hose Assist for footwear: Supervision/touching assist Assist for lower body dressing: Touching or steadying assistance (Pt > 75%)  Function - Toileting Toileting activity did not occur: No continent bowel/bladder event Toileting steps completed by patient: Adjust clothing prior to toileting Toileting steps completed by helper: Performs perineal hygiene, Adjust clothing after toileting Toileting Assistive Devices: Grab bar or rail Assist level: More than reasonable time, Touching or steadying assistance (Pt.75%)  Function - Air cabin crew transfer activity did not occur: N/A Toilet transfer assistive device: Grab bar Assist level to toilet: Touching or steadying assistance (Pt > 75%) Assist level from toilet: Touching or steadying assistance (Pt > 75%)  Function - Chair/bed transfer Chair/bed transfer method: Squat pivot Chair/bed transfer assist level: Supervision or verbal cues Chair/bed transfer assistive device: Armrests Chair/bed transfer details: Verbal cues for technique, Verbal cues for sequencing  Function - Locomotion: Wheelchair Will patient use wheelchair at discharge?: Yes Type: Manual Max wheelchair distance: 75 Assist Level: Touching or steadying assistance (Pt > 75%) Assist Level: Touching or steadying assistance (Pt > 75%) Assist Level: Moderate assistance (Pt 50 - 74%) Turns around,maneuvers to table,bed, and toilet,negotiates 3% grade,maneuvers on rugs and over doorsills: No Function - Locomotion: Ambulation Assistive device: Walker-rolling, Orthosis Max distance: 34 Assist level: Touching or steadying assistance (Pt > 75%) Assist level: Touching or steadying assistance (Pt > 75%) Walk 50 feet with 2  turns activity did not occur: Safety/medical concerns Walk 150 feet activity did not occur: Safety/medical concerns Walk 10 feet on uneven surfaces activity did not occur: Safety/medical concerns  Function - Comprehension Comprehension: Auditory Comprehension assistive device: Hearing aids Comprehension assist level: Follows complex conversation/direction with extra time/assistive device  Function - Expression Expression: Verbal Expression assist level: Expresses basic needs/ideas: With no assist  Function - Social Interaction Social Interaction assist level: Interacts appropriately with others - No medications needed.  Function - Problem Solving Problem solving assist level: Solves basic 90% of the time/requires cueing < 10% of the time  Function - Memory Memory assist level: Recognizes or recalls 75 - 89% of the time/requires cueing 10 - 24% of the time Patient normally able to recall (first 3 days only): Current season, Staff names and faces, That he or she is in a hospital  Medical Problem List and Plan: 1.  Right side weakness, hemisensory loss with dysphagia secondary to left thalamic infarct- cont plavix Team conference today please see physician documentation under team conference tab, met with team face-to-face to discuss problems,progress, and goals. Formulized individual treatment plan based on medical history, underlying problem and comorbidities.  2.  DVT Prophylaxis/Anticoagulation: Subcutaneous Lovenox resumed no evidence of further hematuria 3. Pain Management: Ultram 50 mg every 6 hours as needed moderate pain due to arthritic knee and hip             -pace as needed, we will initiate Voltaren gel for right wrist 4. Dysphagia. Dysphagia #2 thin liquid diet. Follow-up speech therapy.intake 100% of meals 5. Neuropsych: This patient is capable of making decisions on his own behalf. 6. Skin/Wound Care: Routine skin checks 7.  Fluids/Electrolytes/Nutrition: Routine I&O with follow-up chemistries 8. CAD with stenting/history of PVCs. Low dose Lopressor resumed 12.5 mg twice a day. Follow-up cardiology services. Monitor with increased mobility 9. Diabetes mellitus with peripheral neuropathy. Hemoglobin A1c 5.6. Actos 15 mg daily.reduced to dailyCBG (last 3)   Recent Labs  07/04/15 0523 07/05/15 0515 07/06/15 0528  GLUCAP 102* 109* 88    10. Hyperlipidemia. Lipitor 11. Renal cyst without other sig pathology, f/u US as outpt 12. Hypoalbuminemia, start pro-stat   13.  Hematuria-Resolved  Filed Vitals:   07/05/15 2007 07/06/15 0640  BP: 95/54 131/68  Pulse: 82 74  Temp:  98.6 F (37 C)  Resp:  16  14. Right knee osteoarthritis posttraumatic Voltaren gel, custom knee orthosis , Hope to receive this prior to discharge, Also needs right AFO, we'll see if these will be combined. Discussed further with physical therapy in team conference     LOS (Days) [place] E 07/06/2015, 9:40 AM

## 2015-07-06 NOTE — Progress Notes (Signed)
Physical Therapy Session Note  Patient Details  Name: JEOVANY MEAVE MRN: [id number] Date of Birth: 08-29-1927  Today's Date: 07/06/2015 PT Individual Time: [id number] and 1130-1200 PT Individual Time Calculation (min): 60 min and 30 min (total 90 min)   Short Term Goals: Week 2:  PT Short Term Goal 1 (Week 2): =LTG due to estimated LOS  Skilled Therapeutic Interventions/Progress Updates:    Tx 1: Pt received supine in bed; denies pain and agreeable to treatment. Supine >sit with S and bedrails. RLE knee brace donned totalA; AFO already on. Sit >stand with S and bedrails/RW. Gait x50' with RW and minA; min verbal/tactile cues for L weight shift and R step length. Three more shorter trials x20-25' each with similar assist and cueing as above, however fatigues more quickly with RLE increased buckling. Extended seated rest breaks between trials due to fatigue. Returned to room totalA; remained seated in w/c at completion of session, all needs within reach.  Tx 2: Pt received supine in bed, denies pain and agreeable to treatment. Supine>sit and squat pivot transfer with S overall, and wife providing w/c setup and S to prepare for d/c home at w/c level. Squat pivot transfer w/c <>mat table with wife providing assist, S as above. Sit <>stand with one UE on mat, one UE on RW and LLE on 3" step to facilitate R weight shift and weight bearing. Requires table elevated and min/modA, reduced A with repetition. Two trials of sit <>stand on level surface with no UEs however unable to complete. Sit <>stand with BUE support as above, on level surface with min guard decreased to S with repetition; cueing for weight shift to R upon standing. Standing balance with alternating UE reaching to retrieve cards and match to board. Nustep x8 min with BUE/BLE for strengthening and aerobic endurance. Remained seated in w/c at completion of session, all needs within reach.   Therapy Documentation Precautions:   Precautions Precautions: Fall Precaution Comments: right side weakness, impaired coordination/awareness RUE during transfers is injury risk Restrictions Weight Bearing Restrictions: No Pain:  Denies pain   See Function Navigator for Current Functional Status.   Therapy/Group: Individual Therapy  Luberta Mutter 07/06/2015, 3:55 PM

## 2015-07-06 NOTE — Progress Notes (Signed)
Social Work Patient ID: Herbert Marquez, male   DOB: [birth year], 82 y.o.   MRN: [id number] Met with pt, wife and two daughter's who are here to hear team conference update. Discussed pt's progress and plan discharge still for Sat. Wife is doing hands on care and main focus is transfers since she will not be ambulating with pt at home, due to her back issues. Made aware of NH option if  It doesn't work out at home or if team feels she is not safe transferring him here. All agreeable and appreciate input. Wife has gotten a tub bench only need is wheelchair, which will order From [facility]. Will make referral to [facility] for follow up home health therapies due to wife's pref. Work toward discharge Sat.

## 2015-07-07 ENCOUNTER — Inpatient Hospital Stay (HOSPITAL_COMMUNITY): Payer: Medicare Other | Admitting: Physical Therapy

## 2015-07-07 ENCOUNTER — Inpatient Hospital Stay (HOSPITAL_COMMUNITY): Payer: Medicare Other | Admitting: Occupational Therapy

## 2015-07-07 ENCOUNTER — Inpatient Hospital Stay (HOSPITAL_COMMUNITY): Payer: Medicare Other | Admitting: Speech Pathology

## 2015-07-07 LAB — GLUCOSE, CAPILLARY: Glucose-Capillary: 94 mg/dL (ref 65–99)

## 2015-07-07 NOTE — Progress Notes (Signed)
Physical Therapy Session Note  Patient Details  Name: Herbert Marquez MRN: [id number] Date of Birth: 06-25-1927  Today's Date: 07/07/2015 PT Individual Time: 1345-1500 PT Individual Time Calculation (min): 75 min   Short Term Goals: Week 2:  PT Short Term Goal 1 (Week 2): =LTG due to estimated LOS  Skilled Therapeutic Interventions/Progress Updates:    Pt received seated in bed, denies pain and agreeable to treatment. Wife present for majority of session to participate in hands-on training and education. Pt had received his new w/c; educated pt and wife in w/c features, applied velcro to bottom of cushion to decrease sliding, and demonstrated how to fold chair for transport. Wife plans to have their son assist with getting w/c home and then will primarily use transport chair when going out into community. Pt transferred into w/c with wife providing setup of w/c, and S for transfer. W/c propulsion 2x150' during session with theraband applied to R rim to improve grip and propulsion; S overall and min cues for technique. Transfer w/c <>couch in ADL apartment with wife again providing setup and S. Requires min guard for return to w/c due to higher seat height. Nustep x13 min with BUE/BLE for strengthening and aerobic endurance. Pt remained seated in w/c at completion of session, all needs within reach.   Therapy Documentation Precautions:  Precautions Precautions: Fall Precaution Comments: right side weakness, impaired coordination/awareness RUE during transfers is injury risk Restrictions Weight Bearing Restrictions: No   See Function Navigator for Current Functional Status.   Therapy/Group: Individual Therapy  Luberta Mutter 07/07/2015, 3:28 PM

## 2015-07-07 NOTE — Progress Notes (Signed)
Speech Language Pathology Daily Session Note  Patient Details  Name: Herbert Marquez MRN: [id number] Date of Birth: [birth year]  Today's Date: 07/07/2015 SLP Individual Time: 0900-1000 SLP Individual Time Calculation (min): 60 min  Short Term Goals: Week 2: SLP Short Term Goal 1 (Week 2): Pt to demonstrate money counting/change making at mod I level. SLP Short Term Goal 2 (Week 2): Pt to demonstrate reading comprehension at the functional level at mod I. SLP Short Term Goal 3 (Week 2): Pt to demonstrate recall of new information with supervision.  SLP Short Term Goal 4 (Week 2): Pt to manage regular consistency snacks at mod I to demonstrate readiness for diet upgrade. SLP Short Term Goal 4 - Progress (Week 2): Discontinued (comment) (due to pt preference ) SLP Short Term Goal 5 (Week 2): Pt demonstrate completion of medication management task with supervision.   Skilled Therapeutic Interventions:  Pt was seen for skilled ST targeting family education.  Pt's wife was present during today's therapy session and remained actively engaged in training.  SLP facilitated the session with skilled education regarding memory techniques to facilitate recall of daily information.  SLP provide pt and wife with a handout to facilitate carryover in the home environment.  SLP also discussed activities for cognitive organization to continue to address remediation/compensation in the home environment.  At this point both pt and pt's wife are in agreement that pt is near his cognitive baseline and no further ST needs are indicated once discharged home.  Pt and pt's wife both aware of and in agreement with recommendation that pt have initial assistance with returning to managing medications and finances.  Will plan to see pt tomorrow for grad day prior to discharge to measure progress from initial evaluation.  Pt left in wheelchair with wife at bedside.  All questions answered to their satisfaction at this time.   Continue per current plan of care.    Function:  Eating Eating                 Cognition Comprehension Comprehension assist level: Follows complex conversation/direction with extra time/assistive device  Expression   Expression assist level: Expresses complex ideas: With extra time/assistive device  Social Interaction Social Interaction assist level: Interacts appropriately with others - No medications needed.  Problem Solving Problem solving assist level: Solves basic problems with no assist  Memory Memory assist level: Recognizes or recalls 90% of the time/requires cueing < 10% of the time    Pain Pain Assessment Pain Assessment: No/denies pain  Therapy/Group: Individual Therapy  Yareth Kearse, Selinda Orion 07/07/2015, 4:24 PM

## 2015-07-07 NOTE — Progress Notes (Signed)
Occupational Therapy Session Note  Patient Details  Name: Herbert Marquez MRN: [id number] Date of Birth: 20-Feb-1928  Today's Date: 07/07/2015 OT Individual Time: [id number] OT Individual Time Calculation (min): 58 min    Short Term Goals: Week 3:  OT Short Term Goal 1 (Week 3): STG = LTGs due to remaining LOS  Skilled Therapeutic Interventions/Progress Updates:    Treatment session with focus on functional transfers, sit > stand, and family education.  Completed bathing and dressing at sit > stand level with bathing in room shower and dressing from w/c at sink.  Performed stand pivot transfer into shower with min assist and use of grab bars.  Discussed grab bars with pt reporting having a grab bar on side wall of shower, discussed with pt and wife recommendation for grab bar on front wall to assist with stability during transfer and sit > stand for washing buttocks.  Dressing completed with min/steady assist while standing to pull shorts over hips.  Engaged in toilet transfers with pt's wife observing and then progressing to pt's wife providing min assist - supervision.  Pt required min assist from therapist with initial toilet transfer progressing to close supervision - contact guard with wife.  Pt's wife demonstrated ability to provide cues to increase pt setup of transfer and awareness of RUE and RLE positioning prior to transfer.  Recommend pt's wife provide either steady assist when pt standing to complete clothing management or assist with clothing while pt maintains standing (will continue to assess progress with this task).  Engaged in simulated shower transfers with setup similar to pt's home setup with pt able to complete lateral scoot onto tub bench with contact guard and cues for setup.  Therapy Documentation Precautions:  Precautions Precautions: Fall Precaution Comments: right side weakness, impaired coordination/awareness RUE during transfers is injury risk Restrictions Weight  Bearing Restrictions: No General:   Vital Signs: Therapy Vitals Pulse Rate: 68 BP: 139/70 mmHg Pain:  Pt with no c/o pain  See Function Navigator for Current Functional Status.   Therapy/Group: Individual Therapy  [name] 07/07/2015, 9:36 AM

## 2015-07-07 NOTE — Progress Notes (Signed)
Subjective/Complaints: Patient requiring supervision for showering, good bowel movement this morning                                                                                 Review of systems negative chest pain negative shortness of breath negative nausea negative vomiting negative diarrhea ,+ constipation,  Objective: Vital Signs: Blood pressure 139/70, pulse 68, temperature 98.6 F (37 C), temperature source Oral, resp. rate 18, height 5' 7"  (1.702 m), weight 82.736 kg (182 lb 6.4 oz), SpO2 98 %. No results found. Results for orders placed or performed during the hospital encounter of 06/21/15 (from the past 72 hour(s))  Glucose, capillary     Status: Abnormal   Collection Time: 07/05/15  5:15 AM  Result Value Ref Range   Glucose-Capillary 109 (H) 65 - 99 mg/dL  Creatinine, serum     Status: None   Collection Time: 07/05/15  6:15 AM  Result Value Ref Range   Creatinine, Ser 1.00 0.61 - 1.24 mg/dL   GFR calc non Af Amer >60 >60 mL/min   GFR calc Af Amer >60 >60 mL/min    Comment: (NOTE) The eGFR has been calculated using the CKD EPI equation. This calculation has not been validated in all clinical situations. eGFR's persistently <60 mL/min signify possible Chronic Kidney Disease.   Glucose, capillary     Status: None   Collection Time: 07/06/15  5:28 AM  Result Value Ref Range   Glucose-Capillary 88 65 - 99 mg/dL  Glucose, capillary     Status: None   Collection Time: 07/07/15  5:32 AM  Result Value Ref Range   Glucose-Capillary 94 65 - 99 mg/dL     HEENT: normal Cardio: RRR and no murmur Resp: CTA B/L and unlbored GI: BS positive and NT, ND Extremity:  No Edema Skin:   Intact Neuro: Alert/Oriented, Abnormal Sensory absent sensation in the right upper and right lower limb, Normal Motor and Abnormal FMC Ataxic/ dec FMC, Right upper extremity ataxia Musc/Skel:  Other patient has stiffness in the left knee but no evidence of effusion. There is swelling of the right  wrist with bony deformity, right knee no effusion but there is joint enlargement Gen. no acute distress   Assessment/Plan: 1. 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 Physiatrist is providing close team supervision and 24 hour management of active medical problems listed below. Physiatrist and rehab team continue to assess barriers to discharge/monitor patient progress toward functional and medical goals. FIM: Function - Bathing Position: Shower Body parts bathed by patient: Right arm, Left arm, Chest, Abdomen, Front perineal area, Right upper leg, Left upper leg, Right lower leg, Left lower leg, Buttocks Body parts bathed by helper: Back Bathing not applicable: Back Assist Level: Touching or steadying assistance(Pt > 75%)  Function- Upper Body Dressing/Undressing What is the patient wearing?: Pull over shirt/dress Pull over shirt/dress - Perfomed by patient: Thread/unthread left sleeve, Put head through opening, Pull shirt over trunk, Thread/unthread right sleeve Pull over shirt/dress - Perfomed by helper: Thread/unthread right sleeve, Pull shirt over trunk Assist Level: Set  up Set up : To obtain clothing/put away Function - Lower Body Dressing/Undressing What is the patient wearing?: Pants, Non-skid slipper socks Position: Wheelchair/chair at sink Underwear - Performed by patient: Thread/unthread right underwear leg, Thread/unthread left underwear leg Underwear - Performed by helper: Pull underwear up/down Pants- Performed by patient: Thread/unthread right pants leg, Thread/unthread left pants leg, Pull pants up/down Pants- Performed by helper: Pull pants up/down Non-skid slipper socks- Performed by patient: Don/doff right sock, Don/doff left sock Socks - Performed by patient: Don/doff right sock, Don/doff left sock Socks - Performed by  helper: Don/doff right sock, Don/doff left sock Shoes - Performed by patient: Don/doff right shoe, Don/doff left shoe Shoes - Performed by helper: Fasten left, Fasten right TED Hose - Performed by helper: Don/doff right TED hose, Don/doff left TED hose Assist for footwear: Setup Assist for lower body dressing: Touching or steadying assistance (Pt > 75%)  Function - Toileting Toileting activity did not occur: No continent bowel/bladder event Toileting steps completed by patient: Adjust clothing prior to toileting Toileting steps completed by helper: Performs perineal hygiene, Adjust clothing after toileting Toileting Assistive Devices: Grab bar or rail Assist level: More than reasonable time, Touching or steadying assistance (Pt.75%)  Function - Air cabin crew transfer activity did not occur: N/A Toilet transfer assistive device: Grab bar Assist level to toilet: Touching or steadying assistance (Pt > 75%) Assist level from toilet: Touching or steadying assistance (Pt > 75%)  Function - Chair/bed transfer Chair/bed transfer method: Squat pivot Chair/bed transfer assist level: Supervision or verbal cues Chair/bed transfer assistive device: Armrests Chair/bed transfer details: Verbal cues for technique, Verbal cues for sequencing  Function - Locomotion: Wheelchair Will patient use wheelchair at discharge?: Yes Type: Manual Max wheelchair distance: 75 Assist Level: Touching or steadying assistance (Pt > 75%) Assist Level: Touching or steadying assistance (Pt > 75%) Assist Level: Moderate assistance (Pt 50 - 74%) Turns around,maneuvers to table,bed, and toilet,negotiates 3% grade,maneuvers on rugs and over doorsills: No Function - Locomotion: Ambulation Assistive device: Walker-rolling, Orthosis Max distance: 34 Assist level: Touching or steadying assistance (Pt > 75%) Assist level: Touching or steadying assistance (Pt > 75%) Walk 50 feet with 2 turns activity did not occur:  Safety/medical concerns Walk 150 feet activity did not occur: Safety/medical concerns Walk 10 feet on uneven surfaces activity did not occur: Safety/medical concerns  Function - Comprehension Comprehension: Auditory Comprehension assistive device: Hearing aids Comprehension assist level: Follows complex conversation/direction with extra time/assistive device  Function - Expression Expression: Verbal Expression assist level: Expresses complex ideas: With extra time/assistive device  Function - Social Interaction Social Interaction assist level: Interacts appropriately with others - No medications needed.  Function - Problem Solving Problem solving assist level: Solves basic 90% of the time/requires cueing < 10% of the time  Function - Memory Memory assist level: Recognizes or recalls 75 - 89% of the time/requires cueing 10 - 24% of the time Patient normally able to recall (first 3 days only): Current season, Staff names and faces, That he or she is in a hospital  Medical Problem List and Plan: 1.  Right side weakness, hemisensory loss with dysphagia secondary to left thalamic infarct- cont plavix , continue CIR PT OT and speech therapy. Continue family training prior to discharge planned on 5/6                        2.  DVT Prophylaxis/Anticoagulation: Subcutaneous Lovenox resumed no evidence of further hematuria 3. Pain Management:  Ultram 50 mg every 6 hours as needed moderate pain due to arthritic knee and hip             -pace as needed, we will initiate Voltaren gel for right wrist 4. Dysphagia. Dysphagia #2 thin liquid diet. Follow-up speech therapy.intake 100% of meals 5. Neuropsych: This patient is capable of making decisions on his own behalf. 6. Skin/Wound Care: Routine skin checks 7. Fluids/Electrolytes/Nutrition: Routine I&O with follow-up chemistries 8. CAD with stenting/history of PVCs. Low dose Lopressor resumed 12.5 mg twice a day. Follow-up cardiology services. Monitor  with increased mobility 9. Diabetes mellitus with peripheral neuropathy. Hemoglobin A1c 5.6. Actos 15 mg daily.reduced to dailyCBG (last 3)   Recent Labs  07/05/15 0515 07/06/15 0528 07/07/15 0532  GLUCAP 109* 88 94    10. Hyperlipidemia. Lipitor 11. Renal cyst without other sig pathology, f/u US as outpt 12. Hypoalbuminemia, start pro-stat   13.  Hematuria-Resolved  Filed Vitals:   07/07/15 0515 07/07/15 0932  BP: 144/72 139/70  Pulse: 69 68  Temp: 98.6 F (37 C)   Resp: 18   14. Right knee osteoarthritis posttraumatic Voltaren gel, custom knee orthosis , also using right AFO, discussed this with orthotist today     LOS (Days) [place] E 07/07/2015, 9:53 AM

## 2015-07-08 ENCOUNTER — Inpatient Hospital Stay (HOSPITAL_COMMUNITY): Payer: Medicare Other | Admitting: Physical Therapy

## 2015-07-08 ENCOUNTER — Inpatient Hospital Stay (HOSPITAL_COMMUNITY): Payer: Medicare Other | Admitting: Occupational Therapy

## 2015-07-08 ENCOUNTER — Inpatient Hospital Stay (HOSPITAL_COMMUNITY): Payer: Medicare Other | Admitting: Speech Pathology

## 2015-07-08 LAB — GLUCOSE, CAPILLARY: Glucose-Capillary: 96 mg/dL (ref 65–99)

## 2015-07-08 MED ORDER — TRAMADOL HCL 50 MG PO TABS
50.0000 mg | ORAL_TABLET | Freq: Four times a day (QID) | ORAL | Status: DC | PRN
Start: 2015-07-08 — End: 2015-07-21

## 2015-07-08 MED ORDER — ATORVASTATIN CALCIUM 40 MG PO TABS
40.0000 mg | ORAL_TABLET | Freq: Every day | ORAL | Status: DC
Start: 2015-07-08 — End: 2015-07-21

## 2015-07-08 MED ORDER — ATORVASTATIN CALCIUM 40 MG PO TABS: 40.0000 mg | ORAL_TABLET | Freq: Every day | ORAL | Status: DC

## 2015-07-08 MED ORDER — DICLOFENAC SODIUM 1 % TD GEL
2.0000 g | Freq: Four times a day (QID) | TRANSDERMAL | Status: DC
Start: 2015-07-08 — End: 2015-07-21

## 2015-07-08 MED ORDER — PIOGLITAZONE HCL 30 MG PO TABS: 15.0000 mg | ORAL_TABLET | Freq: Every day | ORAL | Status: DC

## 2015-07-08 MED ORDER — METOPROLOL TARTRATE 25 MG PO TABS: 12.5000 mg | ORAL_TABLET | Freq: Two times a day (BID) | ORAL | Status: DC

## 2015-07-08 MED ORDER — ATORVASTATIN CALCIUM 80 MG PO TABS: 80.0000 mg | ORAL_TABLET | Freq: Every day | ORAL | Status: DC

## 2015-07-08 MED ORDER — CLOPIDOGREL BISULFATE 75 MG PO TABS: 75.0000 mg | ORAL_TABLET | Freq: Every day | ORAL | Status: DC

## 2015-07-08 NOTE — Discharge Instructions (Signed)
Inpatient Rehab Discharge Instructions  Herbert Marquez Discharge date and time: No discharge date for patient encounter.   Activities/Precautions/ Functional Status: Activity: activity as tolerated Diet: Mechanical soft Wound Care: none needed Functional status:  ___ No restrictions     ___ Walk up steps independently ___ 24/7 supervision/assistance   ___ Walk up steps with assistance ___ Intermittent supervision/assistance  ___ Bathe/dress independently ___ Walk with walker     _x STROKE/TIA DISCHARGE INSTRUCTIONS SMOKING Cigarette smoking nearly doubles your risk of having a stroke & is the single most alterable risk factor  If you smoke or have smoked in the last 12 months, you are advised to quit smoking for your health.  Most of the excess cardiovascular risk related to smoking disappears within a year of stopping.  Ask you doctor about anti-smoking medications  [place] Quit Line: 1-800-QUIT NOW  Free Smoking Cessation Classes [phone]  CHOLESTEROL Know your levels; limit fat & cholesterol in your diet  Lipid Panel     Component Value Date/Time   CHOL 106 06/17/2015 0433   TRIG 95 06/17/2015 0433   HDL 34* 06/17/2015 0433   CHOLHDL 3.1 06/17/2015 0433   VLDL 19 06/17/2015 0433   LDLCALC 53 06/17/2015 0433      Many patients benefit from treatment even if their cholesterol is at goal.  Goal: Total Cholesterol (CHOL) less than 160  Goal:  Triglycerides (TRIG) less than 150  Goal:  HDL greater than 40  Goal:  LDL (LDLCALC) less than 100   BLOOD PRESSURE American Stroke Association blood pressure target is less that 120/80 mm/Hg  Your discharge blood pressure is:  BP: (!) 144/72 mmHg  Monitor your blood pressure  Limit your salt and alcohol intake  Many individuals will require more than one medication for high blood pressure  DIABETES (A1c is a blood sugar average for last 3 months) Goal HGBA1c is under 7% (HBGA1c is blood sugar average for last 3 months)    Diabetes: No known diagnosis of diabetes    Lab Results  Component Value Date   HGBA1C 5.6 06/17/2015     Your HGBA1c can be lowered with medications, healthy diet, and exercise.  Check your blood sugar as directed by your physician  Call your physician if you experience unexplained or low blood sugars.  PHYSICAL ACTIVITY/REHABILITATION Goal is 30 minutes at least 4 days per week  Activity: Increase activity slowly, Therapies: Physical Therapy: Home Health Return to work:   Activity decreases your risk of heart attack and stroke and makes your heart stronger.  It helps control your weight and blood pressure; helps you relax and can improve your mood.  Participate in a regular exercise program.  Talk with your doctor about the best form of exercise for you (dancing, walking, swimming, cycling).  DIET/WEIGHT Goal is to maintain a healthy weight  Your discharge diet is: DIET DYS 3 Room service appropriate?: Yes; Fluid consistency:: Thin  liquids Your height is:  Height: 5\' 7"  (170.2 cm) Your current weight is: Weight: 82.736 kg (182 lb 6.4 oz) Your Body Mass Index (BMI) is:     Following the type of diet specifically designed for you will help prevent another stroke.  Your goal weight range is:    Your goal Body Mass Index (BMI) is 19-24.  Healthy food habits can help reduce 3 risk factors for stroke:  High cholesterol, hypertension, and excess weight.  RESOURCES Stroke/Support Group:  Call [facility]  GIVEN TO PATIENT Stroke warning signs and symptoms How to activate emergency medical system (call 911). Medications prescribed at discharge. Need for follow-up after discharge. Personal risk factors for stroke. Pneumonia vaccine given:  Flu vaccine given:  My questions have been answered, the writing is legible, and I understand these instructions.  I will adhere to these goals & educational materials that have been provided to me  after my discharge from the hospital.    __ Bathe/dress with assistance ___ Walk Independently    ___ Shower independently ___ Walk with assistance    ___ Shower with assistance ___ No alcohol     ___ Return to work/school ________  Special Instructions:  Follow-up renal ultrasound 4-6 months for right renal cyst    COMMUNITY REFERRALS UPON DISCHARGE:    Home Health:   PT, OT, RN  [place]   Date of last service:07/09/2015  Medical Equipment/Items [place]   GENERAL COMMUNITY RESOURCES FOR PATIENT/FAMILY: Support Groups:[facility] SUPPORT GROUP EVERY SECOND Thursday @ 3:00-4:00 PM ON THE REHAB UNIT QUESTIONS CONTACT KATIE [id number]  My questions have been answered and I understand these instructions. I will adhere to these goals and the provided educational materials after my discharge from the hospital.  Patient/Caregiver Signature _______________________________ Date __________  Clinician Signature _______________________________________ Date __________  Please bring this form and your medication list with you to all your follow-up doctor's appointments.

## 2015-07-08 NOTE — Progress Notes (Signed)
Physical Therapy Discharge Summary  Patient Details  Name: Herbert Marquez MRN: [id number] Date of Birth: April 25, 1927  Today's Date: 07/08/2015 PT Individual Time: 4008-6761 and 1400-1500 PT Individual Time Calculation (min): 42 min and 60 min (total 102 min)    Patient has met 9 of 10 long term goals due to improved activity tolerance, improved balance, improved postural control, increased strength, decreased pain, ability to compensate for deficits, functional use of  right upper extremity and right lower extremity, improved awareness and improved coordination.  Patient to discharge at a wheelchair level Supervision.   Patient's care partner is independent to provide the necessary physical assistance at discharge.  Reasons goals not met: Home environment gait goal unmet due to occasional min guard needed due to RLE buckling with fatigue  Recommendation:  Patient will benefit from ongoing skilled PT services in home health setting to continue to advance safe functional mobility, address ongoing impairments in strength, coordination, balance, activity tolerance, and minimize fall risk.  Equipment: 18x18 w/c  Reasons for discharge: treatment goals met and discharge from hospital  Patient/family agrees with progress made and goals achieved: Yes  PT Discharge Precautions/Restrictions Precautions Precautions: Fall Precaution Comments: right side weakness, impaired coordination/awareness RUE Pain Pain Assessment Pain Assessment: No/denies pain Vision/Perception  Vision - Assessment Eye Alignment: Within Functional Limits Ocular Range of Motion: Within Functional Limits Alignment/Gaze Preference: Within Defined Limits  Cognition Arousal/Alertness: Awake/alert Orientation Level: Oriented X4 Safety/Judgment: Appears intact Sensation Sensation Light Touch: Impaired by gross assessment (mild impairment with light touch in RUE) Proprioception: Impaired by gross  assessment Proprioception Impaired Details: Impaired RUE Coordination Gross Motor Movements are Fluid and Coordinated: No Fine Motor Movements are Fluid and Coordinated: No Finger Nose Finger Test: dysmetria on Rt with overshooting, impaired proprioception and motor control; improved from eval Motor  Motor Motor: Ataxia;Hemiplegia Motor - Discharge Observations: improving ataxia R extremities, mild R hemiparesis  Mobility Bed Mobility Bed Mobility: Supine to Sit;Sit to Supine Supine to Sit: 6: Modified independent (Device/Increase time) Sit to Supine: 6: Modified independent (Device/Increase time) Transfers Transfers: Yes Sit to Stand: 5: Supervision;With upper extremity assist Sit to Stand Details: Verbal cues for precautions/safety;Verbal cues for technique Stand to Sit: 5: Supervision Stand to Sit Details (indicate cue type and reason): Verbal cues for precautions/safety;Verbal cues for technique Squat Pivot Transfers: 5: Supervision Squat Pivot Transfer Details: Verbal cues for precautions/safety Squat Pivot Transfer Details (indicate cue type and reason): occasional cues for w/c setup Locomotion  Ambulation Ambulation: Yes Ambulation/Gait Assistance: 4: Min guard;4: Min assist Ambulation Distance (Feet): 88 Feet Assistive device: Rolling walker;Other (Comment) (RLE AFO, knee brace, RUE hand splint) Ambulation/Gait Assistance Details: Tactile cues for weight beaing;Tactile cues for weight shifting;Verbal cues for precautions/safety;Verbal cues for technique Ambulation/Gait Assistance Details: occasional cueing at R knee for stance control, cues for upright posture Gait Gait: Yes Gait Pattern: Impaired Gait Pattern: Right flexed knee in stance;Decreased stride length;Decreased stance time - right;Decreased trunk rotation;Narrow base of support;Poor foot clearance - right Gait velocity: decreased for age/gender norms Stairs / Additional Locomotion Stairs: Yes Stairs  Assistance: 4: Min guard Stairs Assistance Details: Verbal cues for sequencing;Verbal cues for technique;Verbal cues for precautions/safety;Tactile cues for posture Stairs Assistance Details (indicate cue type and reason): cues for attention to R foot placement Stair Management Technique: Two rails;Forwards;Step to pattern Number of Stairs: 8 Height of Stairs: 6 Wheelchair Mobility Wheelchair Mobility: Yes Wheelchair Assistance: 5: Supervision Wheelchair Assistance Details: Verbal cues for precautions/safety;Verbal cues for technique;Verbal cues for Astronomer:  Both upper extremities Wheelchair Parts Management: Needs assistance Distance: 150  Trunk/Postural Assessment  Cervical Assessment Cervical Assessment: Exceptions to [facility] (forward head posture) Thoracic Assessment Thoracic Assessment: Exceptions to [facility] (increased thoracic kyphosis, R trunk shortening) Lumbar Assessment Lumbar Assessment: Exceptions to [facility] (decreased lumbar lordosis) Postural Control Postural Control: Deficits on evaluation (delayed and inefficient stepping strategies)  Balance Balance Balance Assessed: Yes Static Sitting Balance Static Sitting - Balance Support: No upper extremity supported;Feet supported Static Sitting - Level of Assistance: 6: Modified independent (Device/Increase time) Dynamic Sitting Balance Dynamic Sitting - Balance Support: No upper extremity supported;Feet supported Dynamic Sitting - Level of Assistance: 6: Modified independent (Device/Increase time) Dynamic Sitting - Balance Activities: Lateral lean/weight shifting;Reaching across midline;Forward lean/weight shifting Extremity Assessment  RUE Assessment RUE Assessment: Exceptions to [facility] (ROM WFL, strength grossly 4/5, ataxic) LUE Assessment LUE Assessment: Within Functional Limits (ROM WFL, strength grossly 4+/5) RLE Assessment RLE Assessment:  (hip flexion 4/5, knee extension 4/5, knee flexion 4/5, ankle  dorsiflexion 3/5, ankle plantarflexion 4/5) LLE Assessment LLE Assessment: Within Functional Limits (grossly 4+/5)  Skilled Therapeutic Intervention: Tx 1: Pt received seated in w/c with handoff from OT; denies pain and agreeable to treatment. Assessed all mobility as above with S overall, with the exception of mod I bed mobility, and min guard/minA for gait on level and unlevel surface due to RLE buckling with fatigue at max distance. Wife present to observe session and assist with w/c setup. Educated pt and wife on home safety, falls prevention, performing unfamiliar tasks with family or [place] therapists present initially to increase comfort due to some anxiety from both pt and wife with returning home. Pt remained seated in w/c at completion of session, all needs within reach.  Tx 2: Pt received supine in bed; denies pain and agreeable to treatment. Performed car transfer with pt's wife's car to prepare for d/c tomorrow. Required min cues for w/c setup, and pt performing transfer with S. Sit <>stand with no UE support x3 trials with 10 sec eyes closed with significantly increased sway; limited primarily by fatigue and required rest breaks. Sit <>stand with Stedy x3 trials while performing clothespin tree with RUE for coordination and standing endurance. Nustep x10 min with BUE/BLE for strengthening and aerobic endurance. Squat pivot to return to bed with S; remained supine in bed with all needs in reach at completion of session.    See Function Navigator for Current Functional Status.  Benjiman Core Tygielski 07/08/2015, 3:23 PM

## 2015-07-08 NOTE — Progress Notes (Signed)
Occupational Therapy Session Note  Patient Details  Name: LAVAUGHN DUVERNAY MRN: [id number] Date of Birth: [birth year]  Today's Date: 07/08/2015 OT Individual Time: [id number] and [id number] OT Individual Time Calculation (min): 45 min and 40 min   Short Term Goals: Week 3:  OT Short Term Goal 1 (Week 3): STG = LTGs due to remaining LOS  Skilled Therapeutic Interventions/Progress Updates:    1) Treatment session with focus on ADL retraining and functional transfers.  Pt received upright in w/c finishing breakfast.  Completed stand pivot transfer w/c > tub bench in room shower with supervision and use of grab bars for stability.  Reiterated recommendation for additional grab bar in shower to assist with transfer and sit > stand.  Min guard for bathing when standing to wash buttocks, min/steady assist as pt lowered self back to tub bench.  Noted improved proprioception and awareness of RUE during self-care tasks.  Pt's wife reports she is unable to lift but feels comfortable providing min tactile cues if needed.  Dressing completed at sit > stand level with min/steady assist for standing balance while pulling pants over hips.  Issued shoe buttons to increase independence with fastening shoes, with pt demonstrating ability to fasten shoes.  Grooming completed in sitting at sink with increased time and increased motor control with RUE when reaching to obtain items.  2) Treatment session with focus on RUE motor control and functional transfers.  Pt donned shoes, demonstrating recall of technique to utilize shoe buttons.  Performed squat pivot transfer bed > w/c with supervision/setup.  Toilet transfer supervision, cues for Rt foot placement prior to transfers.  Engaged in [place] fine motor control tasks with 9 hole peg test and Connect 4 to address picking up small items and manipulation in hand to place in various locations.  Provided pt with padded sleeve to wear on Rt arm as pt tends to bump arm during  transfers.  Therapy Documentation Precautions:  Precautions Precautions: Fall Precaution Comments: right side weakness, impaired coordination/awareness RUE during transfers is injury risk Restrictions Weight Bearing Restrictions: No General:   Vital Signs: Therapy Vitals Temp: 98.4 F (36.9 C) Temp Source: Oral Pulse Rate: 70 Resp: 16 BP: (!) 147/70 mmHg Patient Position (if appropriate): Lying Oxygen Therapy SpO2: 99 % O2 Device: Not Delivered Pain: Pain Assessment Pain Assessment: No/denies pain  See Function Navigator for Current Functional Status.   Therapy/Group: Individual Therapy  [name] 07/08/2015, 8:21 AM

## 2015-07-08 NOTE — Progress Notes (Signed)
Social Work  Discharge Note  The overall goal for the admission was met for:   Discharge location: Yes-HOME WITH WIFE WHO CAN PROVIDE 24 HR CARE  Length of Stay: Yes-18 DAYS  Discharge activity level: Yes-SUPERVISION/MIN LEVEL  Home/community participation: Yes  Services provided included: MD, RD, PT, OT, SLP, RN, CM, TR, Pharmacy, Neuropsych and SW  Financial Services: Medicare and Private Insurance: [facility]  Follow-up services arranged: Home Health: [facility]-PT,OT,RN, DME: [facility] and Patient/Family has no preference for HH/DME agencies  Comments (or additional information):WIFE WAS HERE DAILY AND PARTICIPATED IN THERAPIES WITH HUSBAND AND DID HANDS ON CARE. AWARE IF DOESN'T WORK OUT AT HOME THEY HAVE THE OPTION OF NHP FOR 30 DAYS AFTER DC. DAUGHTER'S WERE HERE ALSO AND THEY WILL ASSIST AS MUCH AS THEY CAN.  Patient/Family verbalized understanding of follow-up arrangements: Yes  Individual responsible for coordination of the follow-up plan: SANDRA-WIFE  Confirmed correct DME delivered: Elease Hashimoto 07/08/2015    Elease Hashimoto

## 2015-07-08 NOTE — Progress Notes (Signed)
Subjective/Complaints: Patient states that blood sugar dropped low this morning however reviewing CBGs show it never got below 88                                                                              Review of systems negative chest pain negative shortness of breath negative nausea negative vomiting negative diarrhea ,+ constipation,  Objective: Vital Signs: Blood pressure 147/70, pulse 70, temperature 98.4 F (36.9 C), temperature source Oral, resp. rate 16, height 5\' 7"  (1.702 m), weight 82.4 kg (181 lb 10.5 oz), SpO2 99 %. No results found. Results for orders placed or performed during the hospital encounter of 06/21/15 (from the past 72 hour(s))  Glucose, capillary     Status: None   Collection Time: 07/06/15  5:28 AM  Result Value Ref Range   Glucose-Capillary 88 65 - 99 mg/dL  Glucose, capillary     Status: None   Collection Time: 07/07/15  5:32 AM  Result Value Ref Range   Glucose-Capillary 94 65 - 99 mg/dL  Glucose, capillary     Status: None   Collection Time: 07/08/15  5:14 AM  Result Value Ref Range   Glucose-Capillary 96 65 - 99 mg/dL   Comment 1 Notify RN      HEENT: normal Cardio: RRR and no murmur Resp: CTA B/L and unlbored GI: BS positive and NT, ND Extremity:  No Edema Skin:   Intact Neuro: Alert/Oriented, Abnormal Sensory absent sensation in the right upper and right lower limb, Normal Motor and Abnormal FMC Ataxic/ dec FMC, Right upper extremity ataxia Musc/Skel:  Other patient has stiffness in the left knee but no evidence of effusion. There is swelling of the right wrist with bony deformity, right knee no effusion but there is joint enlargement Gen. no acute distress   Assessment/Plan: 1. 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 Physiatrist is providing close team supervision and 24 hour management of active  medical problems listed below. Physiatrist and rehab team continue to assess barriers to discharge/monitor patient progress toward functional and medical goals. FIM: Function - Bathing Position: Shower Body parts bathed by patient: Right arm, Left arm, Chest, Abdomen, Front perineal area, Right upper leg, Left upper leg, Right lower leg, Left lower leg, Buttocks Body parts bathed by helper: Back Bathing not applicable: Back Assist Level: Touching or steadying assistance(Pt > 75%)  Function- Upper Body Dressing/Undressing What is the patient wearing?: Pull over shirt/dress Pull over shirt/dress - Perfomed by patient: Thread/unthread left sleeve, Put head through opening, Pull shirt over trunk, Thread/unthread right sleeve Pull over shirt/dress - Perfomed by helper: Thread/unthread right sleeve, Pull shirt over trunk Assist Level: Set up Set up : To obtain clothing/put away Function - Lower Body Dressing/Undressing What is the patient wearing?: Pants, Socks, Shoes, AFO Position: Wheelchair/chair at sink Underwear - Performed by patient: Thread/unthread right underwear leg, Thread/unthread left underwear leg Underwear - Performed by helper: Pull underwear up/down Pants- Performed by patient: Thread/unthread right pants leg, Thread/unthread left pants leg, Pull pants up/down Pants- Performed by helper: Pull pants up/down Non-skid slipper socks- Performed by  patient: Don/doff right sock, Don/doff left sock Socks - Performed by patient: Don/doff right sock, Don/doff left sock Socks - Performed by helper: Don/doff right sock, Don/doff left sock Shoes - Performed by patient: Don/doff right shoe, Don/doff left shoe, Fasten right, Fasten left (shoe buttons) Shoes - Performed by helper: Fasten left, Fasten right AFO - Performed by helper: Don/doff right AFO TED Hose - Performed by helper: Don/doff right TED hose, Don/doff left TED hose Assist for footwear: Setup Assist for lower body dressing:  Set up, Supervision or verbal cues Set up : To obtain clothing/put away  Function - Toileting Toileting activity did not occur: No continent bowel/bladder event Toileting steps completed by patient: Adjust clothing prior to toileting, Performs perineal hygiene Toileting steps completed by helper: Adjust clothing after toileting Toileting Assistive Devices: Grab bar or rail Assist level: Supervision or verbal cues  Function - Air cabin crew transfer activity did not occur: N/A Toilet transfer assistive device: Grab bar Assist level to toilet: Touching or steadying assistance (Pt > 75%) Assist level from toilet: Touching or steadying assistance (Pt > 75%)  Function - Chair/bed transfer Chair/bed transfer method: Squat pivot Chair/bed transfer assist level: Supervision or verbal cues Chair/bed transfer assistive device: Armrests Chair/bed transfer details: Verbal cues for technique, Verbal cues for sequencing  Function - Locomotion: Wheelchair Will patient use wheelchair at discharge?: Yes Type: Manual Max wheelchair distance: 150 Assist Level: Supervision or verbal cues Assist Level: Supervision or verbal cues Assist Level: Supervision or verbal cues Turns around,maneuvers to table,bed, and toilet,negotiates 3% grade,maneuvers on rugs and over doorsills: No Function - Locomotion: Ambulation Assistive device: Walker-rolling, Orthosis Max distance: 88 Assist level: Touching or steadying assistance (Pt > 75%) Assist level: Touching or steadying assistance (Pt > 75%) Walk 50 feet with 2 turns activity did not occur: Safety/medical concerns Assist level: Touching or steadying assistance (Pt > 75%) Walk 150 feet activity did not occur: Safety/medical concerns Walk 10 feet on uneven surfaces activity did not occur: Safety/medical concerns Assist level: Touching or steadying assistance (Pt > 75%)  Function - Comprehension Comprehension: Auditory Comprehension assistive  device: Hearing aids Comprehension assist level: Follows complex conversation/direction with extra time/assistive device  Function - Expression Expression: Verbal Expression assist level: Expresses complex ideas: With extra time/assistive device  Function - Social Interaction Social Interaction assist level: Interacts appropriately with others - No medications needed.  Function - Problem Solving Problem solving assist level: Solves basic problems with no assist  Function - Memory Memory assist level: Recognizes or recalls 90% of the time/requires cueing < 10% of the time Patient normally able to recall (first 3 days only): Current season, Staff names and faces, That he or she is in a hospital  Medical Problem List and Plan: 1.  Right side weakness, hemisensory loss with dysphagia secondary to left thalamic infarct- cont plavix ,. Continue family training prior to discharge planned on 5/6                        2.  DVT Prophylaxis/Anticoagulation: Subcutaneous Lovenox resumed no evidence of further hematuria 3. Pain Management: Ultram 50 mg every 6 hours as needed moderate pain due to arthritic knee and hip             -pace as needed, we will initiate Voltaren gel for right wrist 4. Dysphagia. Dysphagia #2 thin liquid diet. Follow-up speech therapy.intake 100% of meals 5. Neuropsych: This patient is capable of making decisions on his own behalf.  6. Skin/Wound Care: Routine skin checks 7. Fluids/Electrolytes/Nutrition: Routine I&O with follow-up chemistries 8. CAD with stenting/history of PVCs. Low dose Lopressor resumed 12.5 mg twice a day. Follow-up cardiology services. Monitor with increased mobility 9. Diabetes mellitus with peripheral neuropathy. Hemoglobin A1c 5.6. Actos 15 mg daily.reduced to dailyCBG (last 3)   Recent Labs  07/06/15 0528 07/07/15 0532 07/08/15 0514  GLUCAP 88 94 96    10. Hyperlipidemia. Lipitor 11. Renal cyst without other sig pathology, f/u US as  outpt 12. Hypoalbuminemia, start pro-stat   13.  Hematuria-Resolved  Filed Vitals:   07/07/15 1624 07/08/15 0516  BP: 107/65 147/70  Pulse: 81 70  Temp: 98.3 F (36.8 C) 98.4 F (36.9 C)  Resp: 18 16  14. Right knee osteoarthritis posttraumatic Voltaren gel, custom knee orthosis with right AFO, pt and PT feel this combination is helpful for gait    LOS (Days) 17 A FACE TO FACE EVALUATION WAS PERFORMED  , E 07/08/2015, 9:04 AM

## 2015-07-08 NOTE — Discharge Summary (Signed)
NAMEAARION, Herbert Marquez               ACCOUNT NO.:  [id number]  MEDICAL RECORD NO.:  [id number]  LOCATION:  4W11C                        FACILITY:  [facility]  PHYSICIAN:  Charlett Blake, M.D.DATE OF BIRTH:  04-10-1927  DATE OF ADMISSION:  06/21/2015 DATE OF DISCHARGE:  07/09/2015                              DISCHARGE SUMMARY   DISCHARGE DIAGNOSES: 1. Left thalamic infarction. 2. Subcutaneous Lovenox for DVT prophylaxis. 3. Pain management. 4. Dysphagia. 5. Coronary artery disease with stenting. 6. Diabetes mellitus with peripheral neuropathy. 7. Hyperlipidemia. 8. Renal cyst without other significant pathology.  HISTORY OF PRESENT ILLNESS:  This is an 82 year old right-handed male, history of hypertension, tachycardia with PVCs, diabetes mellitus, CAD with stenting.  Lives with spouse independent with a cane.  Presented on June 16, 2015, with syncope, dizziness, no seizure activity noted, developed right-sided weakness and facial droop in the ED.  Cranial CT scan negative.  The patient did not receive tPA.  Troponin negative. MRI of the brain showed a left thalamic infarction, chronic small vessel disease.  MRA of the head, occluded left posterior cerebral artery high- grade stenosis, right P2 segment.  MRA of neck with no high-grade stenosis.  CTA of neck probable to moderate high-grade stenosis, left internal carotid artery.  The EEG consistent with mild generalized nonspecific cerebral dysfunction, encephalopathy, no seizure activity. Echocardiogram ejection fraction 60%.  No wall motion abnormalities. Grade 1 diastolic dysfunction.  Neurology consulted, placed on aspirin and Plavix therapy.  Subcutaneous Lovenox for DVT prophylaxis. Dysphagia #2 with thin liquid diet.  On June 19, 2015 low-grade fever, some nonspecific tachycardia.  EKG, right bundle-branch block.  Question new since early 2017 Cardiology Service was followed up, placed on low- dose beta-blocker.  Sepsis  protocol initiated lactic acid 1.9.  CT of the chest without acute abnormality.  Urine culture, negative nitrite. Renal ultrasound, no hydronephrosis.  There was a small 18 mm lower pole right cyst, recommended followup ultrasound in 4-6 months to reassess. Physical and occupational therapy ongoing.  The patient was admitted for comprehensive rehab program.  PAST MEDICAL HISTORY:  See discharge diagnoses.  SOCIAL HISTORY:  Lives with spouse, uses a cane prior to admission. Functional status upon admission to rehab services was moderate assist sit to stand; +2 physical assist stand pivot transfers.  Mod max assist, activities of daily living.  PHYSICAL EXAMINATION:  VITAL SIGNS:  Blood pressure 125/71, pulse 72, temperature 97 respirations 20.  This was an alert male, oriented to person, place, and time. LUNGS:  Clear to auscultation without wheeze.  Cardiac regular rate and rhythm.  No murmur. ABDOMEN:  Soft, nontender.  Good bowel sounds.  He appeared to have reasonable insight and awareness.  Somewhat impulsive.  REHABILITATION HOSPITAL COURSE:  The patient was admitted to inpatient rehab services with therapies initiated on a 3-hour daily basis, consisting of physical therapy, occupational therapy, speech therapy, and rehabilitation nursing.  The following issues were addressed during the patient's rehabilitation stay.  Pertaining to Herbert Marquez' left thalamic infarct remained stable, maintained on aspirin and Plavix therapy.  He would follow up with neurology Services.  Subcutaneous Lovenox for DVT prophylaxis.  He did have some mild hematuria, possibly related  to Lovenox.  His hemoglobin remained stable.  Hematuria resolved.  Pain management use of Ultram with good results for arthritic knee pain.  His diet was advanced to mechanical soft.  Tolerating well. He did have a history of CAD with stenting.  Maintained on low-dose beta- blocker.  Diabetes mellitus, peripheral  neuropathy, hemoglobin A1c 5.6. He remained on Actos.  Incidental findings of a renal cyst without significant pathology during renal ultrasound, advised followup in 4 to 6 months, repeat study.  The patient received weekly collaborative interdisciplinary team conferences to discuss estimated length of stay, family teaching, and any barriers to discharge.  The patient transferred into wheelchair with his wife providing set up for wheelchair and supervision for transfers.  Wheelchair propulsion, supervision overall, transferred wheelchair to couch in the ADL apartment with wife providing set up and supervision.  Activities of daily living and homemaking, completed, bathing, dressing and sit-to-stand level with bathing in room, shower and dressing from wheelchair at sink side.  Perform stand pivot transfers and do shower with minimal assist and use of grab bars. Full family teaching was completed and plan discharge to home.  DISCHARGE MEDICATIONS:  Included: 1. Aspirin 325 mg p.o. daily. 2. Lipitor 80 mg p.o. daily. 3. Plavix 75 mg p.o. daily. 4. Voltaren gel 4 times daily to affected area. 5. Lopressor 12.5 mg p.o. b.i.d. 6. Nitroglycerin as needed. 7. Protonix 40 mg p.o. daily. 8. Actos 15 mg p.o. daily. 9. Tramadol 50 mg every 6 hours as needed for moderate pain, dispense     of 90 tablets.  DIET:  Mechanical soft with diabetic restrictions.  FOLLOWUP:  He would follow up with Dr. Alysia Penna at the outpatient rehab center as directed; Dr. Erlinda Hong, Neurology Services in 1 month, call for appointment; Dr. Thressa Sheller, medical management.     Lauraine Rinne, P.A.   ______________________________ Charlett Blake, M.D.    DA/MEDQ  D:  07/08/2015  T:  07/08/2015  Job:  [id number]  cc:   Charlett Blake, M.D. Thressa Sheller, M.D. Erlinda Hong

## 2015-07-08 NOTE — Discharge Summary (Signed)
Discharge summary job [id number]

## 2015-07-08 NOTE — Progress Notes (Signed)
Speech Language Pathology Discharge Summary  Patient Details  Name: Herbert Marquez MRN: [id number] Date of Birth: 08/29/1927  Today's Date: 07/08/2015 SLP Individual Time: 1100-1130 SLP Individual Time Calculation (min): 30 min   Skilled Therapeutic Interventions:  Pt was seen for skilled ST targeting grad day activities.  SLP administered an alternative version of the MoCA-Blind to measure progress from initial evaluation.  Pt scored 17/22 on the abovementioned assessment (n>/=18) with deficits noted in delayed recall and working memory.  SLP suspects pt to be at his baseline given that he is able to remember personally relevant daily information with at most supervision level question cues.  Pt was left in bed with call bell within reach and bed alarm set.       Patient has met 5 of 5 long term goals.  Patient to discharge at overall Modified Independent level.  Reasons goals not met: n/a   Clinical Impression/Discharge Summary:  Pt made excellent gains while inpatient and is discharging at an overall mod I level.  Pt met 5 out of 5 long term goals and is at baseline for cognition and swallowing.  Pt may be advanced to regular textures foods as tolerated but he continues to prefer soft solids to facilitate ease of self feeding given motor deficits of RUE.  Pt and family education is complete at this time.  Pt is discharging home with 24/7 supervision from his family.  Given that pt is at baseline, no further ST needs are indicated at this time.    Care Partner:  Caregiver Able to Provide Assistance: Yes  Type of Caregiver Assistance: Physical  Recommendation:  None      Equipment: none recommended by SLP    Reasons for discharge: Discharged from hospital   Patient/Family Agrees with Progress Made and Goals Achieved: Yes   Function:  Eating Eating                 Cognition Comprehension Comprehension assist level: Follows complex conversation/direction with extra  time/assistive device  Expression   Expression assist level: Expresses complex ideas: With extra time/assistive device  Social Interaction Social Interaction assist level: Interacts appropriately with others - No medications needed.  Problem Solving Problem solving assist level: Solves complex problems: With extra time  Memory Memory assist level: Recognizes or recalls 90% of the time/requires cueing < 10% of the time   Emilio Math 07/08/2015, 12:18 PM

## 2015-07-08 NOTE — Progress Notes (Signed)
Occupational Therapy Discharge Summary  Patient Details  Name: Herbert Marquez MRN: [id number] Date of Birth: 01-20-1928  Patient has met 9 of 12 long term goals due to improved activity tolerance, improved balance, postural control, ability to compensate for deficits, functional use of  RIGHT upper and RIGHT lower extremity, improved attention, improved awareness and improved coordination.  Patient to discharge at overall Supervision level, min assist for LB dressing and toileting tasks.  Patient's care partner is independent to provide the necessary physical and cognitive assistance at discharge.    Reasons goals not met: Pt continues to require light min assist/steadying assist for dynamic standing balance when completing LB dressing or toileting hygiene.  Pt's wife is able to provide steadying assist, just no lifting.  Recommendation:  Patient will benefit from ongoing skilled OT services in home health setting to continue to advance functional skills in the area of BADL and Reduce care partner burden.  Equipment: No equipment provided  Family obtained tub bench  Reasons for discharge: treatment goals met and discharge from hospital  Patient/family agrees with progress made and goals achieved: Yes  OT Discharge Precautions/Restrictions  Precautions Precautions: Fall Precaution Comments: right side weakness, impaired coordination/awareness RUE General   Vital Signs Therapy Vitals Temp: 98.4 F (36.9 C) Temp Source: Oral Pulse Rate: 70 Resp: 16 BP: (!) 147/70 mmHg Patient Position (if appropriate): Lying Oxygen Therapy SpO2: 99 % O2 Device: Not Delivered Pain Pain Assessment Pain Assessment: No/denies pain ADL  See Function Navigator Vision/Perception  Vision- History Baseline Vision/History: Wears glasses Wears Glasses: Reading only Patient Visual Report: No change from baseline Vision- Assessment Vision Assessment?: Yes Eye Alignment: Within Functional  Limits Ocular Range of Motion: Within Functional Limits Alignment/Gaze Preference: Within Defined Limits Visual Fields: No apparent deficits Depth Perception:  (WFL)  Cognition Arousal/Alertness: Awake/alert Orientation Level: Oriented X4 Safety/Judgment: Appears intact Sensation Sensation Light Touch: Impaired by gross assessment (mild impairment with light touch in RUE) Proprioception: Impaired by gross assessment Proprioception Impaired Details: Impaired RUE Coordination Gross Motor Movements are Fluid and Coordinated: No Fine Motor Movements are Fluid and Coordinated: No Finger Nose Finger Test: dysmetria on Rt with overshooting, impaired proprioception and motor control; improved from eval Extremity/Trunk Assessment RUE Assessment RUE Assessment: Exceptions to WFL (ROM WFL, strength grossly 4/5, ataxic) LUE Assessment LUE Assessment: Within Functional Limits (ROM WFL, strength grossly 4+/5)   See Function Navigator for Current Functional Status.  [name] 07/08/2015, 8:35 AM

## 2015-07-09 DIAGNOSIS — E1142 Type 2 diabetes mellitus with diabetic polyneuropathy: Secondary | ICD-10-CM | POA: Insufficient documentation

## 2015-07-09 LAB — GLUCOSE, CAPILLARY: Glucose-Capillary: 95 mg/dL (ref 65–99)

## 2015-07-09 NOTE — Progress Notes (Signed)
Subjective/Complaints: Pt resting comfortably in bed this AM.  Family at bedside.  Looking forward to going home today.   Review of systems negative chest pain negative shortness of breath negative nausea negative vomiting negative diarrhea  Objective: Vital Signs: Blood pressure 121/73, pulse 66, temperature 98.7 F (37.1 C), temperature source Oral, resp. rate 17, height 5\' 7"  (1.702 m), weight 79 kg (174 lb 2.6 oz), SpO2 97 %. No results found. Results for orders placed or performed during the hospital encounter of 06/21/15 (from the past 72 hour(s))  Glucose, capillary     Status: None   Collection Time: 07/07/15  5:32 AM  Result Value Ref Range   Glucose-Capillary 94 65 - 99 mg/dL  Glucose, capillary     Status: None   Collection Time: 07/08/15  5:14 AM  Result Value Ref Range   Glucose-Capillary 96 65 - 99 mg/dL   Comment 1 Notify RN   Glucose, capillary     Status: None   Collection Time: 07/09/15  6:33 AM  Result Value Ref Range   Glucose-Capillary 95 65 - 99 mg/dL   Comment 1 Notify RN      HEENT: Normocephalic, atraumatic Cardio: RRR and no murmur Resp: CTA B/L and unlbored GI: BS positive and NT, ND Skin:   Intact. Warm and Dry.  Neuro: Alert/Oriented Motor: Grossly 5/5 throughout RUE ataxia, no dysmetria Musc/Skel:  PROM WNL Gen. no acute distress. Vital signs reviewed.   Assessment/Plan: 1. 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 Physiatrist is providing close team supervision and 24 hour management of active medical problems listed below. Physiatrist and rehab team continue to assess barriers to discharge/monitor patient progress toward functional and medical goals. FIM: Function - Bathing Position: Shower Body parts bathed by patient: Right arm, Left arm, Chest, Abdomen, Front perineal area, Right upper leg,  Left upper leg, Right lower leg, Left lower leg, Buttocks Body parts bathed by helper: Back Bathing not applicable: Back Assist Level: Supervision or verbal cues  Function- Upper Body Dressing/Undressing What is the patient wearing?: Pull over shirt/dress Pull over shirt/dress - Perfomed by patient: Thread/unthread left sleeve, Put head through opening, Pull shirt over trunk, Thread/unthread right sleeve Pull over shirt/dress - Perfomed by helper: Thread/unthread right sleeve, Pull shirt over trunk Assist Level: Set up Set up : To obtain clothing/put away Function - Lower Body Dressing/Undressing What is the patient wearing?: Pants, Socks, Shoes, AFO Position: Wheelchair/chair at sink Underwear - Performed by patient: Thread/unthread right underwear leg, Thread/unthread left underwear leg Underwear - Performed by helper: Pull underwear up/down Pants- Performed by patient: Thread/unthread right pants leg, Thread/unthread left pants leg, Pull pants up/down Pants- Performed by helper: Pull pants up/down Non-skid slipper socks- Performed by patient: Don/doff right sock, Don/doff left sock Socks - Performed by patient: Don/doff right sock, Don/doff left sock Socks - Performed by helper: Don/doff right sock, Don/doff left sock Shoes - Performed by patient: Don/doff right shoe, Don/doff left shoe, Fasten right, Fasten left (shoe buttons) Shoes - Performed by helper: Fasten left, Fasten right AFO - Performed by helper: Don/doff right AFO TED Hose - Performed by helper: Don/doff right TED hose, Don/doff left TED hose Assist for footwear: Setup Assist for lower body dressing: Set up, Supervision or verbal cues Set up : To obtain clothing/put away  Function - Toileting Toileting activity did not occur: No continent bowel/bladder event Toileting steps  completed by patient: Adjust clothing prior to toileting, Performs perineal hygiene Toileting steps completed by helper: Adjust clothing after  toileting Toileting Assistive Devices: Grab bar or rail Assist level: Supervision or verbal cues  Function - Air cabin crew transfer activity did not occur: N/A Toilet transfer assistive device: Grab bar Assist level to toilet: Supervision or verbal cues Assist level from toilet: Supervision or verbal cues  Function - Chair/bed transfer Chair/bed transfer method: Squat pivot Chair/bed transfer assist level: Supervision or verbal cues Chair/bed transfer assistive device: Armrests Chair/bed transfer details: Verbal cues for technique, Verbal cues for sequencing  Function - Locomotion: Wheelchair Will patient use wheelchair at discharge?: Yes Type: Manual Max wheelchair distance: 150 Assist Level: Supervision or verbal cues Assist Level: Supervision or verbal cues Assist Level: Supervision or verbal cues Turns around,maneuvers to table,bed, and toilet,negotiates 3% grade,maneuvers on rugs and over doorsills: No Function - Locomotion: Ambulation Assistive device: Walker-rolling, Orthosis Max distance: 88 Assist level: Touching or steadying assistance (Pt > 75%) Assist level: Touching or steadying assistance (Pt > 75%) Walk 50 feet with 2 turns activity did not occur: Safety/medical concerns Assist level: Touching or steadying assistance (Pt > 75%) Walk 150 feet activity did not occur: Safety/medical concerns Walk 10 feet on uneven surfaces activity did not occur: Safety/medical concerns Assist level: Touching or steadying assistance (Pt > 75%)  Function - Comprehension Comprehension: Auditory Comprehension assistive device: Hearing aids Comprehension assist level: Follows complex conversation/direction with extra time/assistive device  Function - Expression Expression: Verbal Expression assist level: Expresses complex ideas: With extra time/assistive device  Function - Social Interaction Social Interaction assist level: Interacts appropriately with others - No  medications needed.  Function - Problem Solving Problem solving assist level: Solves complex problems: With extra time  Function - Memory Memory assist level: Recognizes or recalls 90% of the time/requires cueing < 10% of the time Patient normally able to recall (first 3 days only): Current season, Staff names and faces, That he or she is in a hospital  Medical Problem List and Plan: 1.  Right side weakness, hemisensory loss with dysphagia secondary to left thalamic infarct- cont plavix  D/C today 2.  DVT Prophylaxis/Anticoagulation: Subcutaneous Lovenox resumed no evidence of further hematuria 3. Pain Management: Ultram 50 mg every 6 hours as needed moderate pain due to arthritic knee and hip             -Cont Voltaren gel for right wrist 4. Dysphagia. Dysphagia #3 thin liquid diet. Follow-up speech therapy 5. Neuropsych: This patient is capable of making decisions on his own behalf. 6. Skin/Wound Care: Routine skin checks 7. Fluids/Electrolytes/Nutrition: Routine I&O with follow-up chemistries 8. CAD with stenting/history of PVCs. Low dose Lopressor resumed 12.5 mg twice a day. Follow-up cardiology services. Monitor with increased mobility 9. Diabetes mellitus with peripheral neuropathy. Hemoglobin A1c 5.6. Actos 15 mg daily.reduced to dailyCBG (last 3)   Recent Labs  07/07/15 0532 07/08/15 0514 07/09/15 0633  GLUCAP 94 96 95    10. Hyperlipidemia. Lipitor 11. Renal cyst without other sig pathology, f/u US as outpt 12. Hypoalbuminemia, start pro-stat   13.  Hematuria-Resolved  Filed Vitals:   07/08/15 1511 07/09/15 0554  BP: 114/66 121/73  Pulse: 77 66  Temp: 98.2 F (36.8 C) 98.7 F (37.1 C)  Resp: 18 17  14. Right knee osteoarthritis posttraumatic Voltaren gel, custom knee orthosis with right AFO, pt and PT feel this combination is helpful for gait      LOS (Days) 18 A  FACE TO FACE EVALUATION WAS PERFORMED  Jenasis Straley Lorie Phenix 07/09/2015, 10:52 AM

## 2015-07-09 NOTE — Progress Notes (Signed)
Patient and wife received discharge instructions from Marlowe Shores, PA-C on 07/08/15 with verbal understanding. Dr. Posey Pronto rounded this morning, patient and wife had no further questions. Patient discharged to home with wife after breakfast with belongings.

## 2015-07-10 DIAGNOSIS — Z7984 Long term (current) use of oral hypoglycemic drugs: Secondary | ICD-10-CM | POA: Diagnosis not present

## 2015-07-10 DIAGNOSIS — Z955 Presence of coronary angioplasty implant and graft: Secondary | ICD-10-CM | POA: Diagnosis not present

## 2015-07-10 DIAGNOSIS — I2510 Atherosclerotic heart disease of native coronary artery without angina pectoris: Secondary | ICD-10-CM | POA: Diagnosis not present

## 2015-07-10 DIAGNOSIS — E1142 Type 2 diabetes mellitus with diabetic polyneuropathy: Secondary | ICD-10-CM | POA: Diagnosis not present

## 2015-07-10 DIAGNOSIS — M1711 Unilateral primary osteoarthritis, right knee: Secondary | ICD-10-CM | POA: Diagnosis not present

## 2015-07-10 DIAGNOSIS — I69321 Dysphasia following cerebral infarction: Secondary | ICD-10-CM | POA: Diagnosis not present

## 2015-07-10 DIAGNOSIS — Z87891 Personal history of nicotine dependence: Secondary | ICD-10-CM | POA: Diagnosis not present

## 2015-07-10 DIAGNOSIS — I69351 Hemiplegia and hemiparesis following cerebral infarction affecting right dominant side: Secondary | ICD-10-CM | POA: Diagnosis not present

## 2015-07-10 DIAGNOSIS — N400 Enlarged prostate without lower urinary tract symptoms: Secondary | ICD-10-CM | POA: Diagnosis not present

## 2015-07-10 DIAGNOSIS — I10 Essential (primary) hypertension: Secondary | ICD-10-CM | POA: Diagnosis not present

## 2015-07-10 DIAGNOSIS — Z8673 Personal history of transient ischemic attack (TIA), and cerebral infarction without residual deficits: Secondary | ICD-10-CM | POA: Diagnosis not present

## 2015-07-10 DIAGNOSIS — E785 Hyperlipidemia, unspecified: Secondary | ICD-10-CM | POA: Diagnosis not present

## 2015-07-10 DIAGNOSIS — K219 Gastro-esophageal reflux disease without esophagitis: Secondary | ICD-10-CM | POA: Diagnosis not present

## 2015-07-11 DIAGNOSIS — M1711 Unilateral primary osteoarthritis, right knee: Secondary | ICD-10-CM | POA: Diagnosis not present

## 2015-07-11 DIAGNOSIS — I2510 Atherosclerotic heart disease of native coronary artery without angina pectoris: Secondary | ICD-10-CM | POA: Diagnosis not present

## 2015-07-11 DIAGNOSIS — E1142 Type 2 diabetes mellitus with diabetic polyneuropathy: Secondary | ICD-10-CM | POA: Diagnosis not present

## 2015-07-11 DIAGNOSIS — I10 Essential (primary) hypertension: Secondary | ICD-10-CM | POA: Diagnosis not present

## 2015-07-11 DIAGNOSIS — I69321 Dysphasia following cerebral infarction: Secondary | ICD-10-CM | POA: Diagnosis not present

## 2015-07-11 DIAGNOSIS — I69351 Hemiplegia and hemiparesis following cerebral infarction affecting right dominant side: Secondary | ICD-10-CM | POA: Diagnosis not present

## 2015-07-12 ENCOUNTER — Telehealth: Payer: Self-pay

## 2015-07-12 DIAGNOSIS — E1142 Type 2 diabetes mellitus with diabetic polyneuropathy: Secondary | ICD-10-CM | POA: Diagnosis not present

## 2015-07-12 DIAGNOSIS — I10 Essential (primary) hypertension: Secondary | ICD-10-CM | POA: Diagnosis not present

## 2015-07-12 DIAGNOSIS — I69321 Dysphasia following cerebral infarction: Secondary | ICD-10-CM | POA: Diagnosis not present

## 2015-07-12 DIAGNOSIS — I69351 Hemiplegia and hemiparesis following cerebral infarction affecting right dominant side: Secondary | ICD-10-CM | POA: Diagnosis not present

## 2015-07-12 DIAGNOSIS — I2510 Atherosclerotic heart disease of native coronary artery without angina pectoris: Secondary | ICD-10-CM | POA: Diagnosis not present

## 2015-07-12 DIAGNOSIS — M1711 Unilateral primary osteoarthritis, right knee: Secondary | ICD-10-CM | POA: Diagnosis not present

## 2015-07-12 NOTE — Telephone Encounter (Signed)
  Spoke with wife.  1. Are you/is patient experiencing any problems since coming home? Are there any questions regarding any aspect of care? No issues. 2. Are there any questions regarding medications administration/dosing? Are meds being taken as prescribed? Patient should review meds with caller to confirm. Meds have been confirmed. 3. Have there been any falls? No  4. Has Home Health been to the house and/or have they contacted you? If not, have you tried to contact them? Can we help you contact them? Home Health has been in contact.  5. Are bowels and bladder emptying properly? Are there any unexpected incontinence issues? If applicable, is patient following bowel/bladder programs? No issues.  6. Any fevers, problems with breathing, unexpected pain? No issues. 7. Are there any skin problems or new areas of breakdown? No issues.  8. Has the patient/family member arranged specialty MD follow up (ie cardiology/neurology/renal/surgical/etc)?  Can we help arrange? Needs to make follow up appointment with Dr. Erlinda Hong. 9. Does the patient need any other services or support that we can help arrange? No 10. Are caregivers following through as expected in assisting the patient? Yes, wife.   11. Has the patient quit smoking, drinking alcohol, or using drugs as recommended? Pt is not smoking, drinking alcohol, or using drugs.   Pt has an appointment on 07/19/15 @ 1:30 pm with [facility].

## 2015-07-13 ENCOUNTER — Telehealth: Payer: Self-pay | Admitting: *Deleted

## 2015-07-13 DIAGNOSIS — I69321 Dysphasia following cerebral infarction: Secondary | ICD-10-CM | POA: Diagnosis not present

## 2015-07-13 DIAGNOSIS — I69351 Hemiplegia and hemiparesis following cerebral infarction affecting right dominant side: Secondary | ICD-10-CM | POA: Diagnosis not present

## 2015-07-13 DIAGNOSIS — I2510 Atherosclerotic heart disease of native coronary artery without angina pectoris: Secondary | ICD-10-CM | POA: Diagnosis not present

## 2015-07-13 DIAGNOSIS — M1711 Unilateral primary osteoarthritis, right knee: Secondary | ICD-10-CM | POA: Diagnosis not present

## 2015-07-13 DIAGNOSIS — I10 Essential (primary) hypertension: Secondary | ICD-10-CM | POA: Diagnosis not present

## 2015-07-13 DIAGNOSIS — E1142 Type 2 diabetes mellitus with diabetic polyneuropathy: Secondary | ICD-10-CM | POA: Diagnosis not present

## 2015-07-13 NOTE — Telephone Encounter (Signed)
Herbert Marquez, PT, [facility] called to report that patient had a fall per her offices protocol.  Patient slipped while getting off the commode. Went to the floor and received slight laceration on right orbit.  Patient reports being okay, wife threw away the shoes he was weraing.....FYI

## 2015-07-13 NOTE — Telephone Encounter (Signed)
Lenise Herald, OT, [facility] called asking for verbal orders to address neuromuscular re-education and home orientation 3week1, 2week4.  Verbal orders given per office protocol

## 2015-07-14 NOTE — Telephone Encounter (Signed)
If bleeding has not stopped, would need to go to ED

## 2015-07-15 DIAGNOSIS — I10 Essential (primary) hypertension: Secondary | ICD-10-CM | POA: Diagnosis not present

## 2015-07-15 DIAGNOSIS — I69321 Dysphasia following cerebral infarction: Secondary | ICD-10-CM | POA: Diagnosis not present

## 2015-07-15 DIAGNOSIS — I69351 Hemiplegia and hemiparesis following cerebral infarction affecting right dominant side: Secondary | ICD-10-CM | POA: Diagnosis not present

## 2015-07-15 DIAGNOSIS — M1711 Unilateral primary osteoarthritis, right knee: Secondary | ICD-10-CM | POA: Diagnosis not present

## 2015-07-15 DIAGNOSIS — E1142 Type 2 diabetes mellitus with diabetic polyneuropathy: Secondary | ICD-10-CM | POA: Diagnosis not present

## 2015-07-15 DIAGNOSIS — I2510 Atherosclerotic heart disease of native coronary artery without angina pectoris: Secondary | ICD-10-CM | POA: Diagnosis not present

## 2015-07-18 DIAGNOSIS — I69321 Dysphasia following cerebral infarction: Secondary | ICD-10-CM | POA: Diagnosis not present

## 2015-07-18 DIAGNOSIS — I10 Essential (primary) hypertension: Secondary | ICD-10-CM | POA: Diagnosis not present

## 2015-07-18 DIAGNOSIS — E1142 Type 2 diabetes mellitus with diabetic polyneuropathy: Secondary | ICD-10-CM | POA: Diagnosis not present

## 2015-07-18 DIAGNOSIS — I2510 Atherosclerotic heart disease of native coronary artery without angina pectoris: Secondary | ICD-10-CM | POA: Diagnosis not present

## 2015-07-18 DIAGNOSIS — I69351 Hemiplegia and hemiparesis following cerebral infarction affecting right dominant side: Secondary | ICD-10-CM | POA: Diagnosis not present

## 2015-07-18 DIAGNOSIS — M1711 Unilateral primary osteoarthritis, right knee: Secondary | ICD-10-CM | POA: Diagnosis not present

## 2015-07-19 ENCOUNTER — Encounter: Payer: Medicare Other | Admitting: Physical Medicine & Rehabilitation

## 2015-07-19 DIAGNOSIS — I2510 Atherosclerotic heart disease of native coronary artery without angina pectoris: Secondary | ICD-10-CM | POA: Diagnosis not present

## 2015-07-19 DIAGNOSIS — M1711 Unilateral primary osteoarthritis, right knee: Secondary | ICD-10-CM | POA: Diagnosis not present

## 2015-07-19 DIAGNOSIS — E1142 Type 2 diabetes mellitus with diabetic polyneuropathy: Secondary | ICD-10-CM | POA: Diagnosis not present

## 2015-07-19 DIAGNOSIS — I10 Essential (primary) hypertension: Secondary | ICD-10-CM | POA: Diagnosis not present

## 2015-07-19 DIAGNOSIS — I69321 Dysphasia following cerebral infarction: Secondary | ICD-10-CM | POA: Diagnosis not present

## 2015-07-19 DIAGNOSIS — I69351 Hemiplegia and hemiparesis following cerebral infarction affecting right dominant side: Secondary | ICD-10-CM | POA: Diagnosis not present

## 2015-07-20 DIAGNOSIS — I10 Essential (primary) hypertension: Secondary | ICD-10-CM | POA: Diagnosis not present

## 2015-07-20 DIAGNOSIS — E1142 Type 2 diabetes mellitus with diabetic polyneuropathy: Secondary | ICD-10-CM | POA: Diagnosis not present

## 2015-07-20 DIAGNOSIS — I69321 Dysphasia following cerebral infarction: Secondary | ICD-10-CM | POA: Diagnosis not present

## 2015-07-20 DIAGNOSIS — I69351 Hemiplegia and hemiparesis following cerebral infarction affecting right dominant side: Secondary | ICD-10-CM | POA: Diagnosis not present

## 2015-07-20 DIAGNOSIS — I2510 Atherosclerotic heart disease of native coronary artery without angina pectoris: Secondary | ICD-10-CM | POA: Diagnosis not present

## 2015-07-20 DIAGNOSIS — M1711 Unilateral primary osteoarthritis, right knee: Secondary | ICD-10-CM | POA: Diagnosis not present

## 2015-07-21 ENCOUNTER — Ambulatory Visit (HOSPITAL_BASED_OUTPATIENT_CLINIC_OR_DEPARTMENT_OTHER): Payer: Medicare Other | Admitting: Physical Medicine & Rehabilitation

## 2015-07-21 ENCOUNTER — Encounter: Payer: Medicare Other | Attending: Physical Medicine & Rehabilitation

## 2015-07-21 ENCOUNTER — Encounter: Payer: Self-pay | Admitting: Physical Medicine & Rehabilitation

## 2015-07-21 VITALS — BP 104/66 | HR 71 | Resp 14

## 2015-07-21 DIAGNOSIS — I69351 Hemiplegia and hemiparesis following cerebral infarction affecting right dominant side: Secondary | ICD-10-CM | POA: Diagnosis not present

## 2015-07-21 DIAGNOSIS — I10 Essential (primary) hypertension: Secondary | ICD-10-CM | POA: Insufficient documentation

## 2015-07-21 DIAGNOSIS — E1142 Type 2 diabetes mellitus with diabetic polyneuropathy: Secondary | ICD-10-CM | POA: Diagnosis not present

## 2015-07-21 DIAGNOSIS — Z85828 Personal history of other malignant neoplasm of skin: Secondary | ICD-10-CM | POA: Insufficient documentation

## 2015-07-21 DIAGNOSIS — I69991 Dysphagia following unspecified cerebrovascular disease: Secondary | ICD-10-CM | POA: Diagnosis not present

## 2015-07-21 DIAGNOSIS — I69321 Dysphasia following cerebral infarction: Secondary | ICD-10-CM | POA: Diagnosis not present

## 2015-07-21 DIAGNOSIS — I69393 Ataxia following cerebral infarction: Secondary | ICD-10-CM | POA: Diagnosis not present

## 2015-07-21 DIAGNOSIS — K219 Gastro-esophageal reflux disease without esophagitis: Secondary | ICD-10-CM | POA: Diagnosis not present

## 2015-07-21 DIAGNOSIS — N400 Enlarged prostate without lower urinary tract symptoms: Secondary | ICD-10-CM | POA: Diagnosis not present

## 2015-07-21 DIAGNOSIS — E785 Hyperlipidemia, unspecified: Secondary | ICD-10-CM | POA: Insufficient documentation

## 2015-07-21 DIAGNOSIS — I2510 Atherosclerotic heart disease of native coronary artery without angina pectoris: Secondary | ICD-10-CM | POA: Insufficient documentation

## 2015-07-21 DIAGNOSIS — M1711 Unilateral primary osteoarthritis, right knee: Secondary | ICD-10-CM | POA: Diagnosis not present

## 2015-07-21 DIAGNOSIS — Z5189 Encounter for other specified aftercare: Secondary | ICD-10-CM | POA: Insufficient documentation
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
## 2015-07-21 NOTE — Patient Instructions (Signed)
The neurology appointment can be any time in the next 1-2 months  See me in one month. We will potentially switch you to outpatient therapy at that time  Taken appointment to see Dr. Noah Delaine sometime in the next couple weeks

## 2015-07-25 DIAGNOSIS — I10 Essential (primary) hypertension: Secondary | ICD-10-CM | POA: Diagnosis not present

## 2015-07-25 DIAGNOSIS — I2510 Atherosclerotic heart disease of native coronary artery without angina pectoris: Secondary | ICD-10-CM | POA: Diagnosis not present

## 2015-07-25 DIAGNOSIS — I69351 Hemiplegia and hemiparesis following cerebral infarction affecting right dominant side: Secondary | ICD-10-CM | POA: Diagnosis not present

## 2015-07-25 DIAGNOSIS — E1142 Type 2 diabetes mellitus with diabetic polyneuropathy: Secondary | ICD-10-CM | POA: Diagnosis not present

## 2015-07-25 DIAGNOSIS — M1711 Unilateral primary osteoarthritis, right knee: Secondary | ICD-10-CM | POA: Diagnosis not present

## 2015-07-25 DIAGNOSIS — I69321 Dysphasia following cerebral infarction: Secondary | ICD-10-CM | POA: Diagnosis not present

## 2015-07-26 DIAGNOSIS — I10 Essential (primary) hypertension: Secondary | ICD-10-CM | POA: Diagnosis not present

## 2015-07-26 DIAGNOSIS — E1142 Type 2 diabetes mellitus with diabetic polyneuropathy: Secondary | ICD-10-CM | POA: Diagnosis not present

## 2015-07-26 DIAGNOSIS — I69351 Hemiplegia and hemiparesis following cerebral infarction affecting right dominant side: Secondary | ICD-10-CM | POA: Diagnosis not present

## 2015-07-26 DIAGNOSIS — I2510 Atherosclerotic heart disease of native coronary artery without angina pectoris: Secondary | ICD-10-CM | POA: Diagnosis not present

## 2015-07-26 DIAGNOSIS — I69321 Dysphasia following cerebral infarction: Secondary | ICD-10-CM | POA: Diagnosis not present

## 2015-07-26 DIAGNOSIS — M1711 Unilateral primary osteoarthritis, right knee: Secondary | ICD-10-CM | POA: Diagnosis not present

## 2015-07-27 DIAGNOSIS — I2510 Atherosclerotic heart disease of native coronary artery without angina pectoris: Secondary | ICD-10-CM | POA: Diagnosis not present

## 2015-07-27 DIAGNOSIS — I10 Essential (primary) hypertension: Secondary | ICD-10-CM | POA: Diagnosis not present

## 2015-07-27 DIAGNOSIS — I69321 Dysphasia following cerebral infarction: Secondary | ICD-10-CM | POA: Diagnosis not present

## 2015-07-27 DIAGNOSIS — M1711 Unilateral primary osteoarthritis, right knee: Secondary | ICD-10-CM | POA: Diagnosis not present

## 2015-07-27 DIAGNOSIS — E1142 Type 2 diabetes mellitus with diabetic polyneuropathy: Secondary | ICD-10-CM | POA: Diagnosis not present

## 2015-07-27 DIAGNOSIS — I69351 Hemiplegia and hemiparesis following cerebral infarction affecting right dominant side: Secondary | ICD-10-CM | POA: Diagnosis not present

## 2015-07-28 DIAGNOSIS — I10 Essential (primary) hypertension: Secondary | ICD-10-CM | POA: Diagnosis not present

## 2015-07-28 DIAGNOSIS — I2510 Atherosclerotic heart disease of native coronary artery without angina pectoris: Secondary | ICD-10-CM | POA: Diagnosis not present

## 2015-07-28 DIAGNOSIS — I69321 Dysphasia following cerebral infarction: Secondary | ICD-10-CM | POA: Diagnosis not present

## 2015-07-28 DIAGNOSIS — I69351 Hemiplegia and hemiparesis following cerebral infarction affecting right dominant side: Secondary | ICD-10-CM | POA: Diagnosis not present

## 2015-07-28 DIAGNOSIS — M1711 Unilateral primary osteoarthritis, right knee: Secondary | ICD-10-CM | POA: Diagnosis not present

## 2015-07-28 DIAGNOSIS — E1142 Type 2 diabetes mellitus with diabetic polyneuropathy: Secondary | ICD-10-CM | POA: Diagnosis not present

## 2015-08-01 DIAGNOSIS — M1711 Unilateral primary osteoarthritis, right knee: Secondary | ICD-10-CM | POA: Diagnosis not present

## 2015-08-01 DIAGNOSIS — E1142 Type 2 diabetes mellitus with diabetic polyneuropathy: Secondary | ICD-10-CM | POA: Diagnosis not present

## 2015-08-01 DIAGNOSIS — I10 Essential (primary) hypertension: Secondary | ICD-10-CM | POA: Diagnosis not present

## 2015-08-01 DIAGNOSIS — I69351 Hemiplegia and hemiparesis following cerebral infarction affecting right dominant side: Secondary | ICD-10-CM | POA: Diagnosis not present

## 2015-08-01 DIAGNOSIS — I69321 Dysphasia following cerebral infarction: Secondary | ICD-10-CM | POA: Diagnosis not present

## 2015-08-01 DIAGNOSIS — I2510 Atherosclerotic heart disease of native coronary artery without angina pectoris: Secondary | ICD-10-CM | POA: Diagnosis not present

## 2015-08-02 ENCOUNTER — Telehealth: Payer: Self-pay

## 2015-08-02 NOTE — Telephone Encounter (Signed)
Mary PT with [facility] called for verbal orders to continue PT for 2wk5. Would like to also begin resistance training. Left message to approve orders.

## 2015-08-03 DIAGNOSIS — I69321 Dysphasia following cerebral infarction: Secondary | ICD-10-CM | POA: Diagnosis not present

## 2015-08-03 DIAGNOSIS — I2510 Atherosclerotic heart disease of native coronary artery without angina pectoris: Secondary | ICD-10-CM | POA: Diagnosis not present

## 2015-08-03 DIAGNOSIS — I69351 Hemiplegia and hemiparesis following cerebral infarction affecting right dominant side: Secondary | ICD-10-CM | POA: Diagnosis not present

## 2015-08-03 DIAGNOSIS — I10 Essential (primary) hypertension: Secondary | ICD-10-CM | POA: Diagnosis not present

## 2015-08-03 DIAGNOSIS — E1142 Type 2 diabetes mellitus with diabetic polyneuropathy: Secondary | ICD-10-CM | POA: Diagnosis not present

## 2015-08-03 DIAGNOSIS — M1711 Unilateral primary osteoarthritis, right knee: Secondary | ICD-10-CM | POA: Diagnosis not present

## 2015-08-04 DIAGNOSIS — M1711 Unilateral primary osteoarthritis, right knee: Secondary | ICD-10-CM | POA: Diagnosis not present

## 2015-08-04 DIAGNOSIS — I69321 Dysphasia following cerebral infarction: Secondary | ICD-10-CM | POA: Diagnosis not present

## 2015-08-04 DIAGNOSIS — I2510 Atherosclerotic heart disease of native coronary artery without angina pectoris: Secondary | ICD-10-CM | POA: Diagnosis not present

## 2015-08-04 DIAGNOSIS — E1142 Type 2 diabetes mellitus with diabetic polyneuropathy: Secondary | ICD-10-CM | POA: Diagnosis not present

## 2015-08-04 DIAGNOSIS — I69351 Hemiplegia and hemiparesis following cerebral infarction affecting right dominant side: Secondary | ICD-10-CM | POA: Diagnosis not present

## 2015-08-04 DIAGNOSIS — I10 Essential (primary) hypertension: Secondary | ICD-10-CM | POA: Diagnosis not present

## 2015-08-05 DIAGNOSIS — E1142 Type 2 diabetes mellitus with diabetic polyneuropathy: Secondary | ICD-10-CM | POA: Diagnosis not present

## 2015-08-05 DIAGNOSIS — I69351 Hemiplegia and hemiparesis following cerebral infarction affecting right dominant side: Secondary | ICD-10-CM | POA: Diagnosis not present

## 2015-08-05 DIAGNOSIS — I69321 Dysphasia following cerebral infarction: Secondary | ICD-10-CM | POA: Diagnosis not present

## 2015-08-05 DIAGNOSIS — I2510 Atherosclerotic heart disease of native coronary artery without angina pectoris: Secondary | ICD-10-CM | POA: Diagnosis not present

## 2015-08-05 DIAGNOSIS — I10 Essential (primary) hypertension: Secondary | ICD-10-CM | POA: Diagnosis not present

## 2015-08-05 DIAGNOSIS — M1711 Unilateral primary osteoarthritis, right knee: Secondary | ICD-10-CM | POA: Diagnosis not present

## 2015-08-08 DIAGNOSIS — I10 Essential (primary) hypertension: Secondary | ICD-10-CM | POA: Diagnosis not present

## 2015-08-08 DIAGNOSIS — I69351 Hemiplegia and hemiparesis following cerebral infarction affecting right dominant side: Secondary | ICD-10-CM | POA: Diagnosis not present

## 2015-08-08 DIAGNOSIS — M1711 Unilateral primary osteoarthritis, right knee: Secondary | ICD-10-CM | POA: Diagnosis not present

## 2015-08-08 DIAGNOSIS — E1142 Type 2 diabetes mellitus with diabetic polyneuropathy: Secondary | ICD-10-CM | POA: Diagnosis not present

## 2015-08-08 DIAGNOSIS — I69321 Dysphasia following cerebral infarction: Secondary | ICD-10-CM | POA: Diagnosis not present

## 2015-08-08 DIAGNOSIS — I2510 Atherosclerotic heart disease of native coronary artery without angina pectoris: Secondary | ICD-10-CM | POA: Diagnosis not present

## 2015-08-09 DIAGNOSIS — I2510 Atherosclerotic heart disease of native coronary artery without angina pectoris: Secondary | ICD-10-CM | POA: Diagnosis not present

## 2015-08-09 DIAGNOSIS — I69321 Dysphasia following cerebral infarction: Secondary | ICD-10-CM | POA: Diagnosis not present

## 2015-08-09 DIAGNOSIS — I69351 Hemiplegia and hemiparesis following cerebral infarction affecting right dominant side: Secondary | ICD-10-CM | POA: Diagnosis not present

## 2015-08-09 DIAGNOSIS — I10 Essential (primary) hypertension: Secondary | ICD-10-CM | POA: Diagnosis not present

## 2015-08-09 DIAGNOSIS — E1142 Type 2 diabetes mellitus with diabetic polyneuropathy: Secondary | ICD-10-CM | POA: Diagnosis not present

## 2015-08-09 DIAGNOSIS — M1711 Unilateral primary osteoarthritis, right knee: Secondary | ICD-10-CM | POA: Diagnosis not present

## 2015-08-10 DIAGNOSIS — I69321 Dysphasia following cerebral infarction: Secondary | ICD-10-CM | POA: Diagnosis not present

## 2015-08-10 DIAGNOSIS — I2510 Atherosclerotic heart disease of native coronary artery without angina pectoris: Secondary | ICD-10-CM | POA: Diagnosis not present

## 2015-08-10 DIAGNOSIS — I10 Essential (primary) hypertension: Secondary | ICD-10-CM | POA: Diagnosis not present

## 2015-08-10 DIAGNOSIS — I69351 Hemiplegia and hemiparesis following cerebral infarction affecting right dominant side: Secondary | ICD-10-CM | POA: Diagnosis not present

## 2015-08-10 DIAGNOSIS — E1142 Type 2 diabetes mellitus with diabetic polyneuropathy: Secondary | ICD-10-CM | POA: Diagnosis not present

## 2015-08-10 DIAGNOSIS — M1711 Unilateral primary osteoarthritis, right knee: Secondary | ICD-10-CM | POA: Diagnosis not present

## 2015-08-11 DIAGNOSIS — I69321 Dysphasia following cerebral infarction: Secondary | ICD-10-CM | POA: Diagnosis not present

## 2015-08-11 DIAGNOSIS — M1711 Unilateral primary osteoarthritis, right knee: Secondary | ICD-10-CM | POA: Diagnosis not present

## 2015-08-11 DIAGNOSIS — I2510 Atherosclerotic heart disease of native coronary artery without angina pectoris: Secondary | ICD-10-CM | POA: Diagnosis not present

## 2015-08-11 DIAGNOSIS — E1142 Type 2 diabetes mellitus with diabetic polyneuropathy: Secondary | ICD-10-CM | POA: Diagnosis not present

## 2015-08-11 DIAGNOSIS — I69351 Hemiplegia and hemiparesis following cerebral infarction affecting right dominant side: Secondary | ICD-10-CM | POA: Diagnosis not present

## 2015-08-11 DIAGNOSIS — I10 Essential (primary) hypertension: Secondary | ICD-10-CM | POA: Diagnosis not present

## 2015-08-15 DIAGNOSIS — I10 Essential (primary) hypertension: Secondary | ICD-10-CM | POA: Diagnosis not present

## 2015-08-15 DIAGNOSIS — I2510 Atherosclerotic heart disease of native coronary artery without angina pectoris: Secondary | ICD-10-CM | POA: Diagnosis not present

## 2015-08-15 DIAGNOSIS — I69351 Hemiplegia and hemiparesis following cerebral infarction affecting right dominant side: Secondary | ICD-10-CM | POA: Diagnosis not present

## 2015-08-15 DIAGNOSIS — M1711 Unilateral primary osteoarthritis, right knee: Secondary | ICD-10-CM | POA: Diagnosis not present

## 2015-08-15 DIAGNOSIS — E1142 Type 2 diabetes mellitus with diabetic polyneuropathy: Secondary | ICD-10-CM | POA: Diagnosis not present

## 2015-08-15 DIAGNOSIS — I69321 Dysphasia following cerebral infarction: Secondary | ICD-10-CM | POA: Diagnosis not present

## 2015-08-17 DIAGNOSIS — E1142 Type 2 diabetes mellitus with diabetic polyneuropathy: Secondary | ICD-10-CM | POA: Diagnosis not present

## 2015-08-17 DIAGNOSIS — I2510 Atherosclerotic heart disease of native coronary artery without angina pectoris: Secondary | ICD-10-CM | POA: Diagnosis not present

## 2015-08-17 DIAGNOSIS — M1711 Unilateral primary osteoarthritis, right knee: Secondary | ICD-10-CM | POA: Diagnosis not present

## 2015-08-17 DIAGNOSIS — I69321 Dysphasia following cerebral infarction: Secondary | ICD-10-CM | POA: Diagnosis not present

## 2015-08-17 DIAGNOSIS — I10 Essential (primary) hypertension: Secondary | ICD-10-CM | POA: Diagnosis not present

## 2015-08-17 DIAGNOSIS — I69351 Hemiplegia and hemiparesis following cerebral infarction affecting right dominant side: Secondary | ICD-10-CM | POA: Diagnosis not present

## 2015-08-18 ENCOUNTER — Ambulatory Visit (HOSPITAL_BASED_OUTPATIENT_CLINIC_OR_DEPARTMENT_OTHER): Payer: Medicare Other | Admitting: Physical Medicine & Rehabilitation

## 2015-08-18 ENCOUNTER — Encounter: Payer: Self-pay | Admitting: Physical Medicine & Rehabilitation

## 2015-08-18 ENCOUNTER — Encounter: Payer: Medicare Other | Attending: Physical Medicine & Rehabilitation

## 2015-08-18 VITALS — BP 147/74 | HR 61 | Resp 12

## 2015-08-18 DIAGNOSIS — I69393 Ataxia following cerebral infarction: Secondary | ICD-10-CM

## 2015-08-18 DIAGNOSIS — I69398 Other sequelae of cerebral infarction: Secondary | ICD-10-CM

## 2015-08-18 DIAGNOSIS — N400 Enlarged prostate without lower urinary tract symptoms: Secondary | ICD-10-CM | POA: Diagnosis not present

## 2015-08-18 DIAGNOSIS — E1142 Type 2 diabetes mellitus with diabetic polyneuropathy: Secondary | ICD-10-CM | POA: Insufficient documentation

## 2015-08-18 DIAGNOSIS — K219 Gastro-esophageal reflux disease without esophagitis: Secondary | ICD-10-CM | POA: Diagnosis not present

## 2015-08-18 DIAGNOSIS — I10 Essential (primary) hypertension: Secondary | ICD-10-CM | POA: Diagnosis not present

## 2015-08-18 DIAGNOSIS — I69991 Dysphagia following unspecified cerebrovascular disease: Secondary | ICD-10-CM | POA: Diagnosis not present

## 2015-08-18 DIAGNOSIS — E785 Hyperlipidemia, unspecified: Secondary | ICD-10-CM | POA: Insufficient documentation

## 2015-08-18 DIAGNOSIS — Z5189 Encounter for other specified aftercare: Secondary | ICD-10-CM | POA: Insufficient documentation

## 2015-08-18 DIAGNOSIS — Z85828 Personal history of other malignant neoplasm of skin: Secondary | ICD-10-CM | POA: Insufficient documentation

## 2015-08-18 DIAGNOSIS — I639 Cerebral infarction, unspecified: Secondary | ICD-10-CM | POA: Diagnosis not present

## 2015-08-18 DIAGNOSIS — I2510 Atherosclerotic heart disease of native coronary artery without angina pectoris: Secondary | ICD-10-CM | POA: Diagnosis not present

## 2015-08-18 DIAGNOSIS — R269 Unspecified abnormalities of gait and mobility: Secondary | ICD-10-CM
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
## 2015-08-22 DIAGNOSIS — I2510 Atherosclerotic heart disease of native coronary artery without angina pectoris: Secondary | ICD-10-CM | POA: Diagnosis not present

## 2015-08-22 DIAGNOSIS — I69321 Dysphasia following cerebral infarction: Secondary | ICD-10-CM | POA: Diagnosis not present

## 2015-08-22 DIAGNOSIS — I69351 Hemiplegia and hemiparesis following cerebral infarction affecting right dominant side: Secondary | ICD-10-CM | POA: Diagnosis not present

## 2015-08-22 DIAGNOSIS — E1142 Type 2 diabetes mellitus with diabetic polyneuropathy: Secondary | ICD-10-CM | POA: Diagnosis not present

## 2015-08-22 DIAGNOSIS — M1711 Unilateral primary osteoarthritis, right knee: Secondary | ICD-10-CM | POA: Diagnosis not present

## 2015-08-22 DIAGNOSIS — I10 Essential (primary) hypertension: Secondary | ICD-10-CM | POA: Diagnosis not present

## 2015-08-23 DIAGNOSIS — R300 Dysuria: Secondary | ICD-10-CM | POA: Diagnosis not present

## 2015-08-23 DIAGNOSIS — N390 Urinary tract infection, site not specified: Secondary | ICD-10-CM | POA: Diagnosis not present

## 2015-08-24 DIAGNOSIS — E1142 Type 2 diabetes mellitus with diabetic polyneuropathy: Secondary | ICD-10-CM | POA: Diagnosis not present

## 2015-08-24 DIAGNOSIS — I2510 Atherosclerotic heart disease of native coronary artery without angina pectoris: Secondary | ICD-10-CM | POA: Diagnosis not present

## 2015-08-24 DIAGNOSIS — I69321 Dysphasia following cerebral infarction: Secondary | ICD-10-CM | POA: Diagnosis not present

## 2015-08-24 DIAGNOSIS — I69351 Hemiplegia and hemiparesis following cerebral infarction affecting right dominant side: Secondary | ICD-10-CM | POA: Diagnosis not present

## 2015-08-24 DIAGNOSIS — M1711 Unilateral primary osteoarthritis, right knee: Secondary | ICD-10-CM | POA: Diagnosis not present

## 2015-08-24 DIAGNOSIS — I10 Essential (primary) hypertension: Secondary | ICD-10-CM | POA: Diagnosis not present

## 2015-08-29 DIAGNOSIS — E1142 Type 2 diabetes mellitus with diabetic polyneuropathy: Secondary | ICD-10-CM | POA: Diagnosis not present

## 2015-08-29 DIAGNOSIS — M1711 Unilateral primary osteoarthritis, right knee: Secondary | ICD-10-CM | POA: Diagnosis not present

## 2015-08-29 DIAGNOSIS — I69321 Dysphasia following cerebral infarction: Secondary | ICD-10-CM | POA: Diagnosis not present

## 2015-08-29 DIAGNOSIS — I2510 Atherosclerotic heart disease of native coronary artery without angina pectoris: Secondary | ICD-10-CM | POA: Diagnosis not present

## 2015-08-29 DIAGNOSIS — I69351 Hemiplegia and hemiparesis following cerebral infarction affecting right dominant side: Secondary | ICD-10-CM | POA: Diagnosis not present

## 2015-08-29 DIAGNOSIS — I10 Essential (primary) hypertension: Secondary | ICD-10-CM | POA: Diagnosis not present

## 2015-09-01 DIAGNOSIS — I69351 Hemiplegia and hemiparesis following cerebral infarction affecting right dominant side: Secondary | ICD-10-CM | POA: Diagnosis not present

## 2015-09-01 DIAGNOSIS — I2510 Atherosclerotic heart disease of native coronary artery without angina pectoris: Secondary | ICD-10-CM | POA: Diagnosis not present

## 2015-09-01 DIAGNOSIS — I69321 Dysphasia following cerebral infarction: Secondary | ICD-10-CM | POA: Diagnosis not present

## 2015-09-01 DIAGNOSIS — M1711 Unilateral primary osteoarthritis, right knee: Secondary | ICD-10-CM | POA: Diagnosis not present

## 2015-09-01 DIAGNOSIS — I10 Essential (primary) hypertension: Secondary | ICD-10-CM | POA: Diagnosis not present

## 2015-09-01 DIAGNOSIS — E1142 Type 2 diabetes mellitus with diabetic polyneuropathy: Secondary | ICD-10-CM | POA: Diagnosis not present

## 2015-09-05 DIAGNOSIS — I69321 Dysphasia following cerebral infarction: Secondary | ICD-10-CM | POA: Diagnosis not present

## 2015-09-05 DIAGNOSIS — I69351 Hemiplegia and hemiparesis following cerebral infarction affecting right dominant side: Secondary | ICD-10-CM | POA: Diagnosis not present

## 2015-09-05 DIAGNOSIS — I10 Essential (primary) hypertension: Secondary | ICD-10-CM | POA: Diagnosis not present

## 2015-09-05 DIAGNOSIS — E1142 Type 2 diabetes mellitus with diabetic polyneuropathy: Secondary | ICD-10-CM | POA: Diagnosis not present

## 2015-09-05 DIAGNOSIS — M1711 Unilateral primary osteoarthritis, right knee: Secondary | ICD-10-CM | POA: Diagnosis not present

## 2015-09-05 DIAGNOSIS — I2510 Atherosclerotic heart disease of native coronary artery without angina pectoris: Secondary | ICD-10-CM | POA: Diagnosis not present

## 2015-09-07 DIAGNOSIS — R3915 Urgency of urination: Secondary | ICD-10-CM | POA: Diagnosis not present

## 2015-09-07 DIAGNOSIS — N401 Enlarged prostate with lower urinary tract symptoms: Secondary | ICD-10-CM | POA: Diagnosis not present

## 2015-09-07 DIAGNOSIS — R351 Nocturia: Secondary | ICD-10-CM | POA: Diagnosis not present

## 2015-09-08 ENCOUNTER — Telehealth: Payer: Self-pay | Admitting: Physical Medicine & Rehabilitation

## 2015-09-08 DIAGNOSIS — E785 Hyperlipidemia, unspecified: Secondary | ICD-10-CM | POA: Diagnosis not present

## 2015-09-08 DIAGNOSIS — I69351 Hemiplegia and hemiparesis following cerebral infarction affecting right dominant side: Secondary | ICD-10-CM | POA: Diagnosis not present

## 2015-09-08 DIAGNOSIS — I10 Essential (primary) hypertension: Secondary | ICD-10-CM | POA: Diagnosis not present

## 2015-09-08 DIAGNOSIS — Z8673 Personal history of transient ischemic attack (TIA), and cerebral infarction without residual deficits: Secondary | ICD-10-CM | POA: Diagnosis not present

## 2015-09-08 DIAGNOSIS — Z955 Presence of coronary angioplasty implant and graft: Secondary | ICD-10-CM | POA: Diagnosis not present

## 2015-09-08 DIAGNOSIS — Z7984 Long term (current) use of oral hypoglycemic drugs: Secondary | ICD-10-CM | POA: Diagnosis not present

## 2015-09-08 DIAGNOSIS — K219 Gastro-esophageal reflux disease without esophagitis: Secondary | ICD-10-CM | POA: Diagnosis not present

## 2015-09-08 DIAGNOSIS — I2510 Atherosclerotic heart disease of native coronary artery without angina pectoris: Secondary | ICD-10-CM | POA: Diagnosis not present

## 2015-09-08 DIAGNOSIS — N400 Enlarged prostate without lower urinary tract symptoms: Secondary | ICD-10-CM | POA: Diagnosis not present

## 2015-09-08 DIAGNOSIS — Z87440 Personal history of urinary (tract) infections: Secondary | ICD-10-CM | POA: Diagnosis not present

## 2015-09-08 DIAGNOSIS — E1142 Type 2 diabetes mellitus with diabetic polyneuropathy: Secondary | ICD-10-CM | POA: Diagnosis not present

## 2015-09-08 DIAGNOSIS — M1711 Unilateral primary osteoarthritis, right knee: Secondary | ICD-10-CM | POA: Diagnosis not present

## 2015-09-08 DIAGNOSIS — Z87891 Personal history of nicotine dependence: Secondary | ICD-10-CM | POA: Diagnosis not present

## 2015-09-08 DIAGNOSIS — I69321 Dysphasia following cerebral infarction: Secondary | ICD-10-CM | POA: Diagnosis not present

## 2015-09-08 NOTE — Telephone Encounter (Signed)
Patient needing his PT extended.  Please call [phone].  Stated that his therapist had left several messages to get it extended.

## 2015-09-09 ENCOUNTER — Other Ambulatory Visit: Payer: Self-pay | Admitting: *Deleted

## 2015-09-09 NOTE — Telephone Encounter (Signed)
The plavix was prescribed for stroke so neuro will need to refill

## 2015-09-09 NOTE — Telephone Encounter (Signed)
Left message to return call for details or have PT call for verbal orders.

## 2015-09-09 NOTE — Telephone Encounter (Signed)
Please advise on request as Dr Acie Fredrickson has never filled this for the patient, he was put on it by Dr Donia Guiles while he was an inpatient. Thanks, MI

## 2015-09-12 DIAGNOSIS — E559 Vitamin D deficiency, unspecified: Secondary | ICD-10-CM | POA: Diagnosis not present

## 2015-09-12 DIAGNOSIS — M810 Age-related osteoporosis without current pathological fracture: Secondary | ICD-10-CM | POA: Diagnosis not present

## 2015-09-12 DIAGNOSIS — H9193 Unspecified hearing loss, bilateral: Secondary | ICD-10-CM | POA: Diagnosis not present

## 2015-09-12 DIAGNOSIS — I2510 Atherosclerotic heart disease of native coronary artery without angina pectoris: Secondary | ICD-10-CM | POA: Diagnosis not present

## 2015-09-12 DIAGNOSIS — N400 Enlarged prostate without lower urinary tract symptoms: Secondary | ICD-10-CM | POA: Diagnosis not present

## 2015-09-12 DIAGNOSIS — I129 Hypertensive chronic kidney disease with stage 1 through stage 4 chronic kidney disease, or unspecified chronic kidney disease: Secondary | ICD-10-CM | POA: Diagnosis not present

## 2015-09-12 DIAGNOSIS — Z8673 Personal history of transient ischemic attack (TIA), and cerebral infarction without residual deficits: Secondary | ICD-10-CM | POA: Diagnosis not present

## 2015-09-12 DIAGNOSIS — H6122 Impacted cerumen, left ear: Secondary | ICD-10-CM | POA: Diagnosis not present

## 2015-09-12 DIAGNOSIS — E785 Hyperlipidemia, unspecified: Secondary | ICD-10-CM | POA: Diagnosis not present

## 2015-09-12 DIAGNOSIS — E1122 Type 2 diabetes mellitus with diabetic chronic kidney disease: Secondary | ICD-10-CM | POA: Diagnosis not present

## 2015-09-12 NOTE — Telephone Encounter (Signed)
I spoke with Herbert Marquez and asked her to have PT call us for orders.  Last call documented from PT was in May for 5 weeks.

## 2015-09-13 ENCOUNTER — Ambulatory Visit: Payer: Medicare Other | Admitting: Physical Medicine & Rehabilitation

## 2015-09-13 DIAGNOSIS — I69351 Hemiplegia and hemiparesis following cerebral infarction affecting right dominant side: Secondary | ICD-10-CM | POA: Diagnosis not present

## 2015-09-13 DIAGNOSIS — E1142 Type 2 diabetes mellitus with diabetic polyneuropathy: Secondary | ICD-10-CM | POA: Diagnosis not present

## 2015-09-13 DIAGNOSIS — I69321 Dysphasia following cerebral infarction: Secondary | ICD-10-CM | POA: Diagnosis not present

## 2015-09-13 DIAGNOSIS — I10 Essential (primary) hypertension: Secondary | ICD-10-CM | POA: Diagnosis not present

## 2015-09-13 DIAGNOSIS — I2510 Atherosclerotic heart disease of native coronary artery without angina pectoris: Secondary | ICD-10-CM | POA: Diagnosis not present

## 2015-09-13 DIAGNOSIS — Z87440 Personal history of urinary (tract) infections: Secondary | ICD-10-CM | POA: Diagnosis not present

## 2015-09-15 DIAGNOSIS — I10 Essential (primary) hypertension: Secondary | ICD-10-CM | POA: Diagnosis not present

## 2015-09-15 DIAGNOSIS — Z87440 Personal history of urinary (tract) infections: Secondary | ICD-10-CM | POA: Diagnosis not present

## 2015-09-15 DIAGNOSIS — I69321 Dysphasia following cerebral infarction: Secondary | ICD-10-CM | POA: Diagnosis not present

## 2015-09-15 DIAGNOSIS — E1142 Type 2 diabetes mellitus with diabetic polyneuropathy: Secondary | ICD-10-CM | POA: Diagnosis not present

## 2015-09-15 DIAGNOSIS — I69351 Hemiplegia and hemiparesis following cerebral infarction affecting right dominant side: Secondary | ICD-10-CM | POA: Diagnosis not present

## 2015-09-15 DIAGNOSIS — I2510 Atherosclerotic heart disease of native coronary artery without angina pectoris: Secondary | ICD-10-CM | POA: Diagnosis not present

## 2015-09-16 ENCOUNTER — Encounter: Payer: Self-pay | Admitting: Neurology

## 2015-09-16 ENCOUNTER — Ambulatory Visit (INDEPENDENT_AMBULATORY_CARE_PROVIDER_SITE_OTHER): Payer: Medicare Other | Admitting: Neurology

## 2015-09-16 VITALS — BP 133/74 | HR 60 | Ht 67.0 in

## 2015-09-16 DIAGNOSIS — E785 Hyperlipidemia, unspecified: Secondary | ICD-10-CM

## 2015-09-16 DIAGNOSIS — I471 Supraventricular tachycardia, unspecified: Secondary | ICD-10-CM

## 2015-09-16 DIAGNOSIS — I63533 Cerebral infarction due to unspecified occlusion or stenosis of bilateral posterior cerebral arteries: Secondary | ICD-10-CM | POA: Diagnosis not present

## 2015-09-16 DIAGNOSIS — I10 Essential (primary) hypertension: Secondary | ICD-10-CM

## 2015-09-16 DIAGNOSIS — I639 Cerebral infarction, unspecified: Secondary | ICD-10-CM

## 2015-09-16 DIAGNOSIS — R002 Palpitations: Secondary | ICD-10-CM | POA: Diagnosis not present

## 2015-09-16 NOTE — Patient Instructions (Addendum)
-   continue plavix and lipitor for stroke prevention - you can stop ASA now - check BP and glucose at home and record - Follow up with your primary care physician for stroke risk factor modification. Recommend maintain blood pressure goal 130/80, diabetes with hemoglobin A1c goal below 7.0% and lipids with LDL cholesterol goal below 70 mg/dL.  - continue home PT. Will recommend oupt PT after finishing with home PT - will do 30 day cardiac monitoring this time - follow up in 3 months.

## 2015-09-18 DIAGNOSIS — R002 Palpitations: Secondary | ICD-10-CM | POA: Insufficient documentation

## 2015-09-18 DIAGNOSIS — I639 Cerebral infarction, unspecified: Secondary | ICD-10-CM | POA: Insufficient documentation

## 2015-09-18 HISTORY — DX: Cerebral infarction, unspecified: I63.9

## 2015-09-18 NOTE — Progress Notes (Signed)
STROKE NEUROLOGY FOLLOW UP NOTE  NAME: Herbert Marquez DOB: 06-06-1927  REASON FOR VISIT: stroke follow up HISTORY FROM: pt and wife and chart  Today we had the pleasure of seeing Herbert Marquez in follow-up at our Neurology Clinic. Pt was accompanied by wife.   History Summary Herbert Marquez is a 82 y.o. male with history of hypertension, hyperlipidemia, diabetes, TIA and myocardial infarction was admitted on 06/16/15 for syncope and transient right-sided weakness with mental status change. He did not receive IV t-PA due to deficits resolved. Overnight, he has fluctuation of presenting neuro symptoms with blanking stare PTA with RUE repetitive movement. EEG negative for seizure. No AED was needed. MRI showed left thalamic infarct initially but repeat MRI showed additional right thalamus and right mesial temporal infarcts. MRA head occluded left PCA and right P2 high grade stenosis. CTA head also showed occluded right V4 and left PCA origin and right P3. MRA and CTA neck unremarkable. TTE EF 55-60%, LDL 53 and A1C 5.6. He was put on DAPT and continued lipitor. Recommend 30 day cardiac monitoring as outpt. He was discharged home with [facility].    Interval History During the interval time, the patient has been doing well. He came in with walker due to right knee pain. He still has HH PT/OT twice a week now. RUE much improved and at baseline. RLE mild weakness likely due to chronic right knee pain. At home able to walk with walker for 50 feet. He is going to get right knee brace to help this out. No seizures at home. BP today 133/74. Still on ASA and plavix and lipitor without side effects.   REVIEW OF SYSTEMS: Full 14 system review of systems performed and notable only for those listed below and in HPI above, all others are negative:  Constitutional:   Cardiovascular: swelling in legs Ear/Nose/Throat:  Hearing loss Skin:  Eyes:   Respiratory:   Gastroitestinal:   Genitourinary:    Hematology/Lymphatic:  Easy bruising Endocrine: feeling cold Musculoskeletal:   Allergy/Immunology:   Neurological:  weakness Psychiatric:  Sleep:   The following represents the patient's updated allergies and side effects list: No Known Allergies  The neurologically relevant items on the patient's problem list were reviewed on today's visit.  Neurologic Examination  A problem focused neurological exam (12 or more points of the single system neurologic examination, vital signs counts as 1 point, cranial nerves count for 8 points) was performed.  Blood pressure 133/74, pulse 60, height 5\' 7"  (1.702 m).  General - Well nourished, well developed, in no apparent distress.  Ophthalmologic - Fundi not visualized due to eye movement.  Cardiovascular - Regular rate and rhythm.  Mental Status -  Level of arousal and orientation to time, place, and person were intact. Language including expression, naming, repetition, comprehension was assessed and found intact. Fund of Knowledge was assessed and was intact.  Cranial Nerves II - XII - II - Visual field intact OU. III, IV, VI - Extraocular movements intact. V - Facial sensation intact bilaterally. VII - Facial movement intact bilaterally. VIII - Hard of hearing & vestibular intact bilaterally. X - Palate elevates symmetrically. XI - Chin turning & shoulder shrug intact bilaterally. XII - Tongue protrusion intact.  Motor Strength - The patient's strength was 4+/5 in all extremities except right knee extension 3/5 due to right knee pain and pronator drift was absent.  Bulk was normal and fasciculations were absent.   Motor Tone - Muscle tone  was assessed at the neck and appendages and was normal.  Reflexes - The patient's reflexes were 1+ in all extremities and he had no pathological reflexes.  Sensory - Light touch, temperature/pinprick were assessed and were normal.    Coordination - The patient had normal movements in the hands  with no ataxia or dysmetria.  Tremor was absent.  Gait and Station - able to walk with cane but slow with small stride, difficulty getting up from chair with right knee pain.   Functional score  mRS = 3   0 - No symptoms.   1 - No significant disability. Able to carry out all usual activities, despite some symptoms.   2 - Slight disability. Able to look after own affairs without assistance, but unable to carry out all previous activities.   3 - Moderate disability. Requires some help, but able to walk unassisted.   4 - Moderately severe disability. Unable to attend to own bodily needs without assistance, and unable to walk unassisted.   5 - Severe disability. Requires constant nursing care and attention, bedridden, incontinent.   6 - Dead.   NIH Stroke Scale   Level Of Consciousness 0=Alert; keenly responsive 1=Not alert, but arousable by minor stimulation 2=Not alert, requires repeated stimulation 3=Responds only with reflex movements 0  LOC Questions to Month and Age 29=Answers both questions correctly 1=Answers one question correctly 2=Answers neither question correctly 0  LOC Commands      -Open/Close eyes     -Open/close grip 0=Performs both tasks correctly 1=Performs one task correctly 2=Performs neighter task correctly 0  Best Gaze 0=Normal 1=Partial gaze palsy 2=Forced deviation, or total gaze paresis 0  Visual 0=No visual loss 1=Partial hemianopia 2=Complete hemianopia 3=Bilateral hemianopia (blind including cortical blindness) 0  Facial Palsy 0=Normal symmetrical movement 1=Minor paralysis (asymmetry) 2=Partial paralysis (lower face) 3=Complete paralysis (upper and lower face) 0  Motor  0=No drift, limb holds posture for full 10 seconds 1=Drift, limb holds posture, no drift to bed 2=Some antigravity effort, cannot maintain posture, drifts to bed 3=No effort against gravity, limb falls 4=No movement Right Arm 0     Leg 1    Left Arm 0     Leg 0  Limb  Ataxia 0=Absent 1=Present in one limb 2=Present in two limbs 0  Sensory 0=Normal 1=Mild to moderate sensory loss 2=Severe to total sensory loss 0  Best Language 0=No aphasia, normal 1=Mild to moderate aphasia 2=Mute, global aphasia 3=Mute, global aphasia 0  Dysarthria 0=Normal 1=Mild to moderate 2=Severe, unintelligible or mute/anarthric 0  Extinction/Neglect 0=No abnormality 1=Extinction to bilateral simultaneous stimulation 2=Profound neglect 0  Total   1     Data reviewed: I personally reviewed the images and agree with the radiology interpretations.  Ct Head Wo Contrast 06/16/2015 1. No acute intracranial pathology seen on CT. 2. Mild to moderate cortical volume loss and scattered small vessel ischemic microangiopathy. 3. Chronic ischemic change at the left external capsule.   CTA NECK 06/17/2015 Moderately motion degraded examination. Probable moderate to high-grade stenosis LEFT internal carotid artery origin, limited by motion. Less than 50% stenosis RIGHT internal carotid artery origin. There is no left ICA stenosis by my interpretation   CTA HEAD 06/17/2015 Moderately motion degraded examination. Occluded RIGHT V4 segment. Occluded LEFT posterior cerebral artery at origin. Occluded proximal RIGHT P3 segment.   MRI HEAD 06/16/2015 Mildly motion degraded examination. Acute 8 x 12 mm LEFT thalamic infarct. Involutional changes. Mild to moderate chronic small vessel ischemic disease.  MRA HEAD 06/16/2015 Occluded LEFT posterior cerebral artery, given MRI findings, this is likely acute. High-grade stenosis RIGHT P2 segment, with presumed slow flow distally.   MRA NECK 06/16/2015 Mildly motion degraded examination. No high-grade stenosis. Motion and presumed atherosclerosis resulting in luminal irregularity.   MRI brain limited study 06/16/15 1. Increased conspicuity of the left splenic lesion without significant change and size. 2. No new left-sided infarcts. 3.  A 9 mm medial right thalamic infarct is better visualized on today's study. 4. New 7 mm medial right temporal lobe infarct suggesting ongoing disease.  EEG 06/17/2015 EEG Abnormalities: 1) Mild generalized irregular slow activity Clinical Interpretation: This EEG is consistent with a mild generalized non-specific cerebral dysfunction(encephalopathy). There was no seizure or seizure predisposition recorded on this study.   TTE  06/17/2015 Study Conclusions  Left ventricle: The cavity size was normal. Wall thickness was  normal. Systolic function was normal. The estimated ejection  fraction was in the range of 55% to 60%. Wall motion was normal;  there were no regional wall motion abnormalities. Doppler  parameters are consistent with abnormal left ventricular  relaxation (grade 1 diastolic dysfunction). - Aortic valve: Mildly calcified annulus. - Left atrium: The atrium was moderately dilated. - Right atrium: The atrium was mildly dilated.  Assessment: As you may recall, he is a 82 y.o. Caucasian male with PMH of hypertension, hyperlipidemia, diabetes, TIA and myocardial infarction was admitted on 06/16/15 for left thalamic infarct on initial MRI but repeat MRI showed additional right thalamus and right mesial temporal infarcts. MRA head occluded left PCA and right P2 high grade stenosis. CTA head also showed occluded right V4 and left PCA origin and right P3. MRA and CTA neck unremarkable. EEG negative for seizure. TTE EF 55-60%, LDL 53 and A1C 5.6. He was put on DAPT and continued lipitor. Recommend 30 day cardiac monitoring as outpt. He was discharged home with [facility].  During the interval time, the patient has been doing well except chronic right knee pain. He still has HH PT/OT twice a week now. Right side weakness much improved, walk with walker at home due to right knee. Still on ASA and plavix and lipitor without side effects. 30 day monitoring has not done yet.  Plan:  - continue  plavix and lipitor for stroke prevention - discontinue ASA - check BP and glucose at home and record - Follow up with your primary care physician for stroke risk factor modification. Recommend maintain blood pressure goal 130/80, diabetes with hemoglobin A1c goal below 7.0% and lipids with LDL cholesterol goal below 70 mg/dL.  - continue home PT. Will recommend oupt PT after finishing with home PT - will do 30 day cardiac monitoring this time - follow up in 3 months.   I spent more than 25 minutes of face to face time with the patient. Greater than 50% of time was spent in counseling and coordination of care. We discussed cardiac monitoring, stop ASA, continue plavix, continued rehab and avoid fall.    Orders Placed This Encounter  Procedures  . Cardiac event monitor    Standing Status: Future     Number of Occurrences:      Standing Expiration Date: 09/18/2016    Scheduling Instructions:     Request cardionet setup. Thank you.    Order Specific Question:  Where should this test be performed?    Answer:  [facility]    No orders of the defined types were placed in this encounter.  Patient Instructions  - continue plavix and lipitor for stroke prevention - you can stop ASA now - check BP and glucose at home and record - Follow up with your primary care physician for stroke risk factor modification. Recommend maintain blood pressure goal 130/80, diabetes with hemoglobin A1c goal below 7.0% and lipids with LDL cholesterol goal below 70 mg/dL.  - continue home PT. Will recommend oupt PT after finishing with home PT - will do 30 day cardiac monitoring this time - follow up in 3 months.     Rosalin Hawking, MD PhD [facility] [address] [place], [place] [address] [phone]

## 2015-09-19 DIAGNOSIS — I10 Essential (primary) hypertension: Secondary | ICD-10-CM | POA: Diagnosis not present

## 2015-09-19 DIAGNOSIS — I2510 Atherosclerotic heart disease of native coronary artery without angina pectoris: Secondary | ICD-10-CM | POA: Diagnosis not present

## 2015-09-19 DIAGNOSIS — Z87440 Personal history of urinary (tract) infections: Secondary | ICD-10-CM | POA: Diagnosis not present

## 2015-09-19 DIAGNOSIS — I69351 Hemiplegia and hemiparesis following cerebral infarction affecting right dominant side: Secondary | ICD-10-CM | POA: Diagnosis not present

## 2015-09-19 DIAGNOSIS — I69321 Dysphasia following cerebral infarction: Secondary | ICD-10-CM | POA: Diagnosis not present

## 2015-09-19 DIAGNOSIS — E1142 Type 2 diabetes mellitus with diabetic polyneuropathy: Secondary | ICD-10-CM | POA: Diagnosis not present

## 2015-09-21 ENCOUNTER — Ambulatory Visit (INDEPENDENT_AMBULATORY_CARE_PROVIDER_SITE_OTHER): Payer: Medicare Other

## 2015-09-21 ENCOUNTER — Encounter (INDEPENDENT_AMBULATORY_CARE_PROVIDER_SITE_OTHER): Payer: Self-pay

## 2015-09-21 ENCOUNTER — Other Ambulatory Visit: Payer: Self-pay | Admitting: Neurology

## 2015-09-21 DIAGNOSIS — E1142 Type 2 diabetes mellitus with diabetic polyneuropathy: Secondary | ICD-10-CM | POA: Diagnosis not present

## 2015-09-21 DIAGNOSIS — I639 Cerebral infarction, unspecified: Secondary | ICD-10-CM | POA: Diagnosis not present

## 2015-09-21 DIAGNOSIS — R002 Palpitations: Secondary | ICD-10-CM

## 2015-09-21 DIAGNOSIS — I471 Supraventricular tachycardia, unspecified: Secondary | ICD-10-CM

## 2015-09-21 DIAGNOSIS — I4891 Unspecified atrial fibrillation: Secondary | ICD-10-CM | POA: Diagnosis not present

## 2015-09-21 DIAGNOSIS — I69321 Dysphasia following cerebral infarction: Secondary | ICD-10-CM | POA: Diagnosis not present

## 2015-09-21 DIAGNOSIS — I2510 Atherosclerotic heart disease of native coronary artery without angina pectoris: Secondary | ICD-10-CM | POA: Diagnosis not present

## 2015-09-21 DIAGNOSIS — Z87440 Personal history of urinary (tract) infections: Secondary | ICD-10-CM | POA: Diagnosis not present

## 2015-09-21 DIAGNOSIS — I69351 Hemiplegia and hemiparesis following cerebral infarction affecting right dominant side: Secondary | ICD-10-CM | POA: Diagnosis not present

## 2015-09-21 DIAGNOSIS — I10 Essential (primary) hypertension: Secondary | ICD-10-CM | POA: Diagnosis not present

## 2015-09-23 ENCOUNTER — Ambulatory Visit (HOSPITAL_BASED_OUTPATIENT_CLINIC_OR_DEPARTMENT_OTHER): Payer: Medicare Other | Admitting: Physical Medicine & Rehabilitation

## 2015-09-23 ENCOUNTER — Encounter: Payer: Self-pay | Admitting: Physical Medicine & Rehabilitation

## 2015-09-23 ENCOUNTER — Encounter: Payer: Medicare Other | Attending: Physical Medicine & Rehabilitation

## 2015-09-23 VITALS — BP 130/69 | HR 65 | Resp 16

## 2015-09-23 DIAGNOSIS — E1142 Type 2 diabetes mellitus with diabetic polyneuropathy: Secondary | ICD-10-CM | POA: Insufficient documentation

## 2015-09-23 DIAGNOSIS — I10 Essential (primary) hypertension: Secondary | ICD-10-CM | POA: Diagnosis not present

## 2015-09-23 DIAGNOSIS — Z85828 Personal history of other malignant neoplasm of skin: Secondary | ICD-10-CM | POA: Diagnosis not present

## 2015-09-23 DIAGNOSIS — I69991 Dysphagia following unspecified cerebrovascular disease: Secondary | ICD-10-CM | POA: Diagnosis not present

## 2015-09-23 DIAGNOSIS — I69398 Other sequelae of cerebral infarction: Secondary | ICD-10-CM

## 2015-09-23 DIAGNOSIS — I69359 Hemiplegia and hemiparesis following cerebral infarction affecting unspecified side: Secondary | ICD-10-CM | POA: Diagnosis not present

## 2015-09-23 DIAGNOSIS — E785 Hyperlipidemia, unspecified: Secondary | ICD-10-CM | POA: Diagnosis not present

## 2015-09-23 DIAGNOSIS — I2510 Atherosclerotic heart disease of native coronary artery without angina pectoris: Secondary | ICD-10-CM | POA: Diagnosis not present

## 2015-09-23 DIAGNOSIS — I69393 Ataxia following cerebral infarction: Secondary | ICD-10-CM | POA: Diagnosis not present

## 2015-09-23 DIAGNOSIS — N400 Enlarged prostate without lower urinary tract symptoms: Secondary | ICD-10-CM | POA: Insufficient documentation

## 2015-09-23 DIAGNOSIS — I639 Cerebral infarction, unspecified: Secondary | ICD-10-CM

## 2015-09-23 DIAGNOSIS — Z5189 Encounter for other specified aftercare: Secondary | ICD-10-CM | POA: Diagnosis not present

## 2015-09-23 DIAGNOSIS — K219 Gastro-esophageal reflux disease without esophagitis: Secondary | ICD-10-CM | POA: Insufficient documentation

## 2015-09-23 NOTE — Patient Instructions (Addendum)
PT and OT from [facility] Neuro rehabilitation. We'll call you to schedule appointments. Tell them you have home health for another 2 weeks and they will adjust the time that you start.  Bring walker and braces to next visit

## 2015-09-23 NOTE — Progress Notes (Signed)
Subjective:  82 year old male with history of diabetes and coronary artery disease to developed right-sided weakness and facial droop on 06/16/2015. Further workup revealed a left thalamic infarct, acute. Patient was admitted to the hospital then transferred to the rehabilitation unit on 06/21/2015 and completed his inpatient rehabilitation program on 07/09/2015. The patient discharged to home with his wife and is receiving home health therapy services. He feels like he has regained some sensation in his right hand. He walked over 100 feet with physical therapy at home using a gait belt and a walker. His wife is able to get him in and out of the car and up and down the wheelchair ramp. He is able to get in and out of the shower with shower chair and wheelchair.  Patient ID: Herbert Marquez, male    DOB: [birth year], 82 y.o.   MRN: [id number]  HPI Patient feels like he is still making some progress with sensation in the right upper. Has recently seen in neurology. No change in medication. Patient's wife indicates that home health therapy is likely to be coming out for about another 2 weeks. Pain Inventory Average Pain 2 Pain Right Now 0 My pain is NA  In the last 24 hours, has pain interfered with the following? General activity 0 Relation with others 0 Enjoyment of life 0 What TIME of day is your pain at its worst? morning Sleep (in general) Good  Pain is worse with: bending and standing Pain improves with: therapy/exercise Relief from Meds: NA  Mobility walk with assistance use a walker how many minutes can you walk? 3 ability to climb steps?  yes do you drive?  no use a wheelchair Do you have any goals in this area?  yes  Function retired Do you have any goals in this area?  yes  Neuro/Psych bladder control problems weakness trouble walking  Prior Studies Any changes since last visit?  no  Physicians involved in your care Primary care . Neurologist .   Family  History  Problem Relation Age of Onset  . Lung cancer Father   . Lung cancer Brother   . Hypertension Mother    Social History   Social History  . Marital Status: Married    Spouse Name: N/A  . Number of Children: N/A  . Years of Education: N/A   Occupational History  . Retired Other   Social History Main Topics  . Smoking status: Former Smoker    Quit date: 03/06/1971  . Smokeless tobacco: Never Used  . Alcohol Use: 1.2 oz/week    1 Glasses of wine, 1 Cans of beer per week     Comment: 1/2 BEER AND 1/2 WINE  . Drug Use: No  . Sexual Activity: No   Other Topics Concern  . None   Social History Narrative   Lives in [place], [place] with wife. Has 3 children.    Past Surgical History  Procedure Laterality Date  . Coronary angioplasty with stent placement  10/2004    stenting x 2 to RCA  . Cataract extraction, bilateral    . Cardiovascular stress test  07/01/2007    EF 74%  . Hip arthroplasty  03/09/2011    Procedure: ARTHROPLASTY BIPOLAR HIP;  Surgeon: Mauri Pole;  Location: [facility] ORS;  Service: Orthopedics;  Laterality: Left;  . Hernia repair    . Laparoscopic incisional / umbilical / ventral hernia repair      "below his naval"   Past Medical History  Diagnosis Date  . Coronary artery disease     a. s/p PCI of RCA in 2006  . Hyperlipidemia   . Hypertension   . BPH (benign prostatic hypertrophy)   . TIA (transient ischemic attack)     Approximately 6 weeks post-cardiac catheterization.   . Type II diabetes mellitus ([place])     "prediabetic; lost alot of weight; not diabetic now" (06/16/2015)  . GERD (gastroesophageal reflux disease)   . Arthritis     "pretty much all over"   . Basal cell carcinoma     "several burned off his body, face, head"  . CVA (cerebral infarction)     a. 06/2015: left thalamic and bilateral PCA  . Stroke ([place])    BP 130/69 mmHg  Pulse 65  Resp 16  SpO2 97%  Opioid Risk Score:   Fall Risk Score:  `1  Depression screen PHQ  2/9  Depression screen [facility] 2/9 08/18/2015 07/21/2015  Decreased Interest 0 0  Down, Depressed, Hopeless 0 1  PHQ - 2 Score 0 1  Altered sleeping - 0  Tired, decreased energy - 2  Change in appetite - 0  Feeling bad or failure about yourself  - 1  Trouble concentrating - 0  Moving slowly or fidgety/restless - 1  Suicidal thoughts - 0  PHQ-9 Score - 5  Difficult doing work/chores - Somewhat difficult     Review of Systems  Constitutional:       Bladder control problems   Musculoskeletal: Positive for gait problem.  Neurological: Positive for weakness.  All other systems reviewed and are negative.      Objective:   Physical Exam  Constitutional: He is oriented to person, place, and time. He appears well-developed and well-nourished.  HENT:  Head: Normocephalic and atraumatic.  Eyes: Conjunctivae and EOM are normal. Pupils are equal, round, and reactive to light.  Neurological: He is alert and oriented to person, place, and time. He displays tremor. He displays no atrophy.  Sensation is able to distinguish light touch and pinprick in the right upper and right lower limb. It still does not feel the same as on the left side, however. Motor strength is 4/5 in the right deltoid, biceps, triceps, grip, hip flexor, knee extensor, ankle dorsiflexor and plantar flexor. There is evidence of dysmetria, finger-nose-finger testing as well as heel-to-shin testing on the right side.  Does not have his walker with him. Gait not tested  Psychiatric: He has a normal mood and affect.  Nursing note and vitals reviewed.         Assessment & Plan:  1. Left thalamic infarct with right hemiparesis, hemisensory deficits and sensory ataxia. Overall has made improvements, although starting plateau with home health. Will write orders for outpatient PT and OT. Has transportation. Neurology follow-up PMandR follow-up 6 weeks

## 2015-09-27 DIAGNOSIS — Z87440 Personal history of urinary (tract) infections: Secondary | ICD-10-CM | POA: Diagnosis not present

## 2015-09-27 DIAGNOSIS — I69351 Hemiplegia and hemiparesis following cerebral infarction affecting right dominant side: Secondary | ICD-10-CM | POA: Diagnosis not present

## 2015-09-27 DIAGNOSIS — I2510 Atherosclerotic heart disease of native coronary artery without angina pectoris: Secondary | ICD-10-CM | POA: Diagnosis not present

## 2015-09-27 DIAGNOSIS — E1142 Type 2 diabetes mellitus with diabetic polyneuropathy: Secondary | ICD-10-CM | POA: Diagnosis not present

## 2015-09-27 DIAGNOSIS — I69321 Dysphasia following cerebral infarction: Secondary | ICD-10-CM | POA: Diagnosis not present

## 2015-09-27 DIAGNOSIS — I10 Essential (primary) hypertension: Secondary | ICD-10-CM | POA: Diagnosis not present

## 2015-09-29 DIAGNOSIS — I69351 Hemiplegia and hemiparesis following cerebral infarction affecting right dominant side: Secondary | ICD-10-CM | POA: Diagnosis not present

## 2015-09-29 DIAGNOSIS — I10 Essential (primary) hypertension: Secondary | ICD-10-CM | POA: Diagnosis not present

## 2015-09-29 DIAGNOSIS — I2510 Atherosclerotic heart disease of native coronary artery without angina pectoris: Secondary | ICD-10-CM | POA: Diagnosis not present

## 2015-09-29 DIAGNOSIS — I69321 Dysphasia following cerebral infarction: Secondary | ICD-10-CM | POA: Diagnosis not present

## 2015-09-29 DIAGNOSIS — Z87440 Personal history of urinary (tract) infections: Secondary | ICD-10-CM | POA: Diagnosis not present

## 2015-09-29 DIAGNOSIS — E1142 Type 2 diabetes mellitus with diabetic polyneuropathy: Secondary | ICD-10-CM | POA: Diagnosis not present

## 2015-10-03 DIAGNOSIS — I69321 Dysphasia following cerebral infarction: Secondary | ICD-10-CM | POA: Diagnosis not present

## 2015-10-03 DIAGNOSIS — I10 Essential (primary) hypertension: Secondary | ICD-10-CM | POA: Diagnosis not present

## 2015-10-03 DIAGNOSIS — I69351 Hemiplegia and hemiparesis following cerebral infarction affecting right dominant side: Secondary | ICD-10-CM | POA: Diagnosis not present

## 2015-10-03 DIAGNOSIS — I2510 Atherosclerotic heart disease of native coronary artery without angina pectoris: Secondary | ICD-10-CM | POA: Diagnosis not present

## 2015-10-03 DIAGNOSIS — E1142 Type 2 diabetes mellitus with diabetic polyneuropathy: Secondary | ICD-10-CM | POA: Diagnosis not present

## 2015-10-03 DIAGNOSIS — Z87440 Personal history of urinary (tract) infections: Secondary | ICD-10-CM | POA: Diagnosis not present

## 2015-10-04 ENCOUNTER — Telehealth: Payer: Self-pay | Admitting: Physical Medicine & Rehabilitation

## 2015-10-04 NOTE — Telephone Encounter (Signed)
Spoke with [name]. Approved verbal orders per office protocol.

## 2015-10-04 NOTE — Telephone Encounter (Signed)
Ashley Murrain PT needs to get verbal orders for 2w4 for strengthening and balance.  Please call her at [phone].

## 2015-10-06 DIAGNOSIS — I10 Essential (primary) hypertension: Secondary | ICD-10-CM | POA: Diagnosis not present

## 2015-10-06 DIAGNOSIS — E1142 Type 2 diabetes mellitus with diabetic polyneuropathy: Secondary | ICD-10-CM | POA: Diagnosis not present

## 2015-10-06 DIAGNOSIS — I69321 Dysphasia following cerebral infarction: Secondary | ICD-10-CM | POA: Diagnosis not present

## 2015-10-06 DIAGNOSIS — Z87440 Personal history of urinary (tract) infections: Secondary | ICD-10-CM | POA: Diagnosis not present

## 2015-10-06 DIAGNOSIS — I2510 Atherosclerotic heart disease of native coronary artery without angina pectoris: Secondary | ICD-10-CM | POA: Diagnosis not present

## 2015-10-06 DIAGNOSIS — I69351 Hemiplegia and hemiparesis following cerebral infarction affecting right dominant side: Secondary | ICD-10-CM | POA: Diagnosis not present

## 2015-10-11 DIAGNOSIS — I2510 Atherosclerotic heart disease of native coronary artery without angina pectoris: Secondary | ICD-10-CM | POA: Diagnosis not present

## 2015-10-11 DIAGNOSIS — I10 Essential (primary) hypertension: Secondary | ICD-10-CM | POA: Diagnosis not present

## 2015-10-11 DIAGNOSIS — Z87440 Personal history of urinary (tract) infections: Secondary | ICD-10-CM | POA: Diagnosis not present

## 2015-10-11 DIAGNOSIS — I69321 Dysphasia following cerebral infarction: Secondary | ICD-10-CM | POA: Diagnosis not present

## 2015-10-11 DIAGNOSIS — I69351 Hemiplegia and hemiparesis following cerebral infarction affecting right dominant side: Secondary | ICD-10-CM | POA: Diagnosis not present

## 2015-10-11 DIAGNOSIS — E1142 Type 2 diabetes mellitus with diabetic polyneuropathy: Secondary | ICD-10-CM | POA: Diagnosis not present

## 2015-10-12 ENCOUNTER — Telehealth: Payer: Self-pay | Admitting: Neurology

## 2015-10-12 NOTE — Telephone Encounter (Signed)
Rn call patients wife about life watch device. PTs wife stated she was told to call our office. Rn explain that Dr. Erlinda Hong is not a cardiologist and he did the referral for her husband to have the monitor. Pts wife stated she receive he monitor from our office. Rn explain to wife that [place] does not monitor heart monitors or put them on. Rn stated she was at cardiology on [address]. Rn explain we get the report after 30 days on the results. Rn gave patients wife number or [phone] to call about concerns for the heart monitor.

## 2015-10-12 NOTE — Telephone Encounter (Signed)
Patient's wife is calling. She needs to discuss the Life Watch patch the patient has had on since 09-21-15 and is due to come off 10-21-15. The patch keeps coming off and wonders if it is necessary to continue the patch. She checked with [employer] and was advised to call our office. Please call and discuss.

## 2015-10-14 DIAGNOSIS — I10 Essential (primary) hypertension: Secondary | ICD-10-CM | POA: Diagnosis not present

## 2015-10-14 DIAGNOSIS — E1142 Type 2 diabetes mellitus with diabetic polyneuropathy: Secondary | ICD-10-CM | POA: Diagnosis not present

## 2015-10-14 DIAGNOSIS — Z87440 Personal history of urinary (tract) infections: Secondary | ICD-10-CM | POA: Diagnosis not present

## 2015-10-14 DIAGNOSIS — I69321 Dysphasia following cerebral infarction: Secondary | ICD-10-CM | POA: Diagnosis not present

## 2015-10-14 DIAGNOSIS — I69351 Hemiplegia and hemiparesis following cerebral infarction affecting right dominant side: Secondary | ICD-10-CM | POA: Diagnosis not present

## 2015-10-14 DIAGNOSIS — I2510 Atherosclerotic heart disease of native coronary artery without angina pectoris: Secondary | ICD-10-CM | POA: Diagnosis not present

## 2015-10-18 DIAGNOSIS — I10 Essential (primary) hypertension: Secondary | ICD-10-CM | POA: Diagnosis not present

## 2015-10-18 DIAGNOSIS — E1142 Type 2 diabetes mellitus with diabetic polyneuropathy: Secondary | ICD-10-CM | POA: Diagnosis not present

## 2015-10-18 DIAGNOSIS — I69351 Hemiplegia and hemiparesis following cerebral infarction affecting right dominant side: Secondary | ICD-10-CM | POA: Diagnosis not present

## 2015-10-18 DIAGNOSIS — I69321 Dysphasia following cerebral infarction: Secondary | ICD-10-CM | POA: Diagnosis not present

## 2015-10-18 DIAGNOSIS — Z87440 Personal history of urinary (tract) infections: Secondary | ICD-10-CM | POA: Diagnosis not present

## 2015-10-18 DIAGNOSIS — I2510 Atherosclerotic heart disease of native coronary artery without angina pectoris: Secondary | ICD-10-CM | POA: Diagnosis not present

## 2015-10-20 DIAGNOSIS — I69321 Dysphasia following cerebral infarction: Secondary | ICD-10-CM | POA: Diagnosis not present

## 2015-10-20 DIAGNOSIS — I69351 Hemiplegia and hemiparesis following cerebral infarction affecting right dominant side: Secondary | ICD-10-CM | POA: Diagnosis not present

## 2015-10-20 DIAGNOSIS — I10 Essential (primary) hypertension: Secondary | ICD-10-CM | POA: Diagnosis not present

## 2015-10-20 DIAGNOSIS — Z87440 Personal history of urinary (tract) infections: Secondary | ICD-10-CM | POA: Diagnosis not present

## 2015-10-20 DIAGNOSIS — E1142 Type 2 diabetes mellitus with diabetic polyneuropathy: Secondary | ICD-10-CM | POA: Diagnosis not present

## 2015-10-20 DIAGNOSIS — I2510 Atherosclerotic heart disease of native coronary artery without angina pectoris: Secondary | ICD-10-CM | POA: Diagnosis not present

## 2015-10-24 DIAGNOSIS — I69351 Hemiplegia and hemiparesis following cerebral infarction affecting right dominant side: Secondary | ICD-10-CM | POA: Diagnosis not present

## 2015-10-24 DIAGNOSIS — I10 Essential (primary) hypertension: Secondary | ICD-10-CM | POA: Diagnosis not present

## 2015-10-24 DIAGNOSIS — E1142 Type 2 diabetes mellitus with diabetic polyneuropathy: Secondary | ICD-10-CM | POA: Diagnosis not present

## 2015-10-24 DIAGNOSIS — Z87440 Personal history of urinary (tract) infections: Secondary | ICD-10-CM | POA: Diagnosis not present

## 2015-10-24 DIAGNOSIS — I2510 Atherosclerotic heart disease of native coronary artery without angina pectoris: Secondary | ICD-10-CM | POA: Diagnosis not present

## 2015-10-24 DIAGNOSIS — I69321 Dysphasia following cerebral infarction: Secondary | ICD-10-CM | POA: Diagnosis not present

## 2015-10-27 DIAGNOSIS — Z87440 Personal history of urinary (tract) infections: Secondary | ICD-10-CM | POA: Diagnosis not present

## 2015-10-27 DIAGNOSIS — I2510 Atherosclerotic heart disease of native coronary artery without angina pectoris: Secondary | ICD-10-CM | POA: Diagnosis not present

## 2015-10-27 DIAGNOSIS — I10 Essential (primary) hypertension: Secondary | ICD-10-CM | POA: Diagnosis not present

## 2015-10-27 DIAGNOSIS — E1142 Type 2 diabetes mellitus with diabetic polyneuropathy: Secondary | ICD-10-CM | POA: Diagnosis not present

## 2015-10-27 DIAGNOSIS — I69321 Dysphasia following cerebral infarction: Secondary | ICD-10-CM | POA: Diagnosis not present

## 2015-10-27 DIAGNOSIS — I69351 Hemiplegia and hemiparesis following cerebral infarction affecting right dominant side: Secondary | ICD-10-CM | POA: Diagnosis not present

## 2015-10-31 DIAGNOSIS — I69321 Dysphasia following cerebral infarction: Secondary | ICD-10-CM | POA: Diagnosis not present

## 2015-10-31 DIAGNOSIS — I2510 Atherosclerotic heart disease of native coronary artery without angina pectoris: Secondary | ICD-10-CM | POA: Diagnosis not present

## 2015-10-31 DIAGNOSIS — I10 Essential (primary) hypertension: Secondary | ICD-10-CM | POA: Diagnosis not present

## 2015-10-31 DIAGNOSIS — E1142 Type 2 diabetes mellitus with diabetic polyneuropathy: Secondary | ICD-10-CM | POA: Diagnosis not present

## 2015-10-31 DIAGNOSIS — I69351 Hemiplegia and hemiparesis following cerebral infarction affecting right dominant side: Secondary | ICD-10-CM | POA: Diagnosis not present

## 2015-10-31 DIAGNOSIS — Z87440 Personal history of urinary (tract) infections: Secondary | ICD-10-CM | POA: Diagnosis not present

## 2015-11-03 DIAGNOSIS — Z87440 Personal history of urinary (tract) infections: Secondary | ICD-10-CM | POA: Diagnosis not present

## 2015-11-03 DIAGNOSIS — I10 Essential (primary) hypertension: Secondary | ICD-10-CM | POA: Diagnosis not present

## 2015-11-03 DIAGNOSIS — E1142 Type 2 diabetes mellitus with diabetic polyneuropathy: Secondary | ICD-10-CM | POA: Diagnosis not present

## 2015-11-03 DIAGNOSIS — I69321 Dysphasia following cerebral infarction: Secondary | ICD-10-CM | POA: Diagnosis not present

## 2015-11-03 DIAGNOSIS — I2510 Atherosclerotic heart disease of native coronary artery without angina pectoris: Secondary | ICD-10-CM | POA: Diagnosis not present

## 2015-11-03 DIAGNOSIS — I69351 Hemiplegia and hemiparesis following cerebral infarction affecting right dominant side: Secondary | ICD-10-CM | POA: Diagnosis not present

## 2015-11-04 ENCOUNTER — Inpatient Hospital Stay (HOSPITAL_COMMUNITY)
Admission: EM | Admit: 2015-11-04 | Discharge: 2015-11-07 | DRG: 872 | Disposition: A | Discharge status: Home Health | Payer: Medicare Other | Attending: Internal Medicine | Admitting: Internal Medicine

## 2015-11-04 ENCOUNTER — Encounter (HOSPITAL_COMMUNITY): Payer: Self-pay

## 2015-11-04 DIAGNOSIS — A419 Sepsis, unspecified organism: Principal | ICD-10-CM | POA: Diagnosis present

## 2015-11-04 DIAGNOSIS — Z96642 Presence of left artificial hip joint: Secondary | ICD-10-CM | POA: Diagnosis present

## 2015-11-04 DIAGNOSIS — R350 Frequency of micturition: Secondary | ICD-10-CM | POA: Diagnosis not present

## 2015-11-04 DIAGNOSIS — Z8673 Personal history of transient ischemic attack (TIA), and cerebral infarction without residual deficits: Secondary | ICD-10-CM | POA: Diagnosis not present

## 2015-11-04 DIAGNOSIS — E118 Type 2 diabetes mellitus with unspecified complications: Secondary | ICD-10-CM | POA: Diagnosis present

## 2015-11-04 DIAGNOSIS — I69351 Hemiplegia and hemiparesis following cerebral infarction affecting right dominant side: Secondary | ICD-10-CM

## 2015-11-04 DIAGNOSIS — N281 Cyst of kidney, acquired: Secondary | ICD-10-CM | POA: Diagnosis present

## 2015-11-04 DIAGNOSIS — N390 Urinary tract infection, site not specified: Secondary | ICD-10-CM | POA: Diagnosis not present

## 2015-11-04 DIAGNOSIS — K219 Gastro-esophageal reflux disease without esophagitis: Secondary | ICD-10-CM | POA: Diagnosis present

## 2015-11-04 DIAGNOSIS — I2510 Atherosclerotic heart disease of native coronary artery without angina pectoris: Secondary | ICD-10-CM | POA: Diagnosis present

## 2015-11-04 DIAGNOSIS — A498 Other bacterial infections of unspecified site: Secondary | ICD-10-CM | POA: Diagnosis present

## 2015-11-04 DIAGNOSIS — Z85828 Personal history of other malignant neoplasm of skin: Secondary | ICD-10-CM

## 2015-11-04 DIAGNOSIS — J449 Chronic obstructive pulmonary disease, unspecified: Secondary | ICD-10-CM | POA: Diagnosis present

## 2015-11-04 DIAGNOSIS — Z87891 Personal history of nicotine dependence: Secondary | ICD-10-CM

## 2015-11-04 DIAGNOSIS — N400 Enlarged prostate without lower urinary tract symptoms: Secondary | ICD-10-CM | POA: Diagnosis present

## 2015-11-04 DIAGNOSIS — I10 Essential (primary) hypertension: Secondary | ICD-10-CM | POA: Diagnosis not present

## 2015-11-04 DIAGNOSIS — Z955 Presence of coronary angioplasty implant and graft: Secondary | ICD-10-CM

## 2015-11-04 DIAGNOSIS — I69393 Ataxia following cerebral infarction: Secondary | ICD-10-CM

## 2015-11-04 DIAGNOSIS — E1142 Type 2 diabetes mellitus with diabetic polyneuropathy: Secondary | ICD-10-CM | POA: Diagnosis present

## 2015-11-04 DIAGNOSIS — N3281 Overactive bladder: Secondary | ICD-10-CM | POA: Diagnosis present

## 2015-11-04 DIAGNOSIS — Z79899 Other long term (current) drug therapy: Secondary | ICD-10-CM

## 2015-11-04 DIAGNOSIS — Z801 Family history of malignant neoplasm of trachea, bronchus and lung: Secondary | ICD-10-CM

## 2015-11-04 DIAGNOSIS — Z7902 Long term (current) use of antithrombotics/antiplatelets: Secondary | ICD-10-CM

## 2015-11-04 DIAGNOSIS — Z8249 Family history of ischemic heart disease and other diseases of the circulatory system: Secondary | ICD-10-CM

## 2015-11-04 DIAGNOSIS — E785 Hyperlipidemia, unspecified: Secondary | ICD-10-CM | POA: Diagnosis present

## 2015-11-04 DIAGNOSIS — Z87440 Personal history of urinary (tract) infections: Secondary | ICD-10-CM

## 2015-11-04 LAB — CBC WITH DIFFERENTIAL/PLATELET
Basophils Absolute: 0 10*3/uL (ref 0.0–0.1)
Basophils Relative: 0 %
Eosinophils Absolute: 0 10*3/uL (ref 0.0–0.7)
Eosinophils Relative: 0 %
HCT: 36.2 % — ABNORMAL LOW (ref 39.0–52.0)
Hemoglobin: 11.8 g/dL — ABNORMAL LOW (ref 13.0–17.0)
Lymphocytes Relative: 5 %
Lymphs Abs: 0.8 10*3/uL (ref 0.7–4.0)
MCH: 29.5 pg (ref 26.0–34.0)
MCHC: 32.6 g/dL (ref 30.0–36.0)
MCV: 90.5 fL (ref 78.0–100.0)
Monocytes Absolute: 1.2 10*3/uL — ABNORMAL HIGH (ref 0.1–1.0)
Monocytes Relative: 8 %
Neutro Abs: 13.1 10*3/uL — ABNORMAL HIGH (ref 1.7–7.7)
Neutrophils Relative %: 87 %
Platelets: 299 10*3/uL (ref 150–400)
RBC: 4 MIL/uL — ABNORMAL LOW (ref 4.22–5.81)
RDW: 15.8 % — ABNORMAL HIGH (ref 11.5–15.5)
WBC: 15.1 10*3/uL — ABNORMAL HIGH (ref 4.0–10.5)

## 2015-11-04 LAB — URINE MICROSCOPIC-ADD ON: Squamous Epithelial / HPF: NONE SEEN

## 2015-11-04 LAB — URINALYSIS, ROUTINE W REFLEX MICROSCOPIC
Bilirubin Urine: NEGATIVE
Glucose, UA: NEGATIVE mg/dL
Hgb urine dipstick: NEGATIVE
Ketones, ur: 40 mg/dL — AB
Nitrite: NEGATIVE
Protein, ur: 30 mg/dL — AB
Specific Gravity, Urine: 1.019 (ref 1.005–1.030)
pH: 8 (ref 5.0–8.0)

## 2015-11-04 LAB — APTT: aPTT: 34 seconds (ref 24–36)

## 2015-11-04 LAB — COMPREHENSIVE METABOLIC PANEL
ALT: 15 U/L — ABNORMAL LOW (ref 17–63)
AST: 34 U/L (ref 15–41)
Albumin: 3.5 g/dL (ref 3.5–5.0)
Alkaline Phosphatase: 61 U/L (ref 38–126)
Anion gap: 11 (ref 5–15)
BUN: 12 mg/dL (ref 6–20)
CO2: 19 mmol/L — ABNORMAL LOW (ref 22–32)
Calcium: 9.2 mg/dL (ref 8.9–10.3)
Chloride: 100 mmol/L — ABNORMAL LOW (ref 101–111)
Creatinine, Ser: 1.21 mg/dL (ref 0.61–1.24)
GFR calc Af Amer: >60 mL/min (ref >60)
GFR calc non Af Amer: 52 mL/min — ABNORMAL LOW (ref >60)
Glucose, Bld: 105 mg/dL — ABNORMAL HIGH (ref 65–99)
Potassium: 3.8 mmol/L (ref 3.5–5.1)
Sodium: 130 mmol/L — ABNORMAL LOW (ref 135–145)
Total Bilirubin: 0.8 mg/dL (ref 0.3–1.2)
Total Protein: 5.9 g/dL — ABNORMAL LOW (ref 6.5–8.1)

## 2015-11-04 LAB — GLUCOSE, CAPILLARY: Glucose-Capillary: 125 mg/dL — ABNORMAL HIGH (ref 65–99)

## 2015-11-04 LAB — LACTIC ACID, PLASMA: Lactic Acid, Venous: 1.7 mmol/L (ref 0.5–1.9)

## 2015-11-04 LAB — PROTIME-INR
INR: 1.27
Prothrombin Time: 16 seconds — ABNORMAL HIGH (ref 11.4–15.2)

## 2015-11-04 LAB — I-STAT CG4 LACTIC ACID, ED
Lactic Acid, Venous: 1.34 mmol/L (ref 0.5–1.9)
Lactic Acid, Venous: 0.61 mmol/L (ref 0.5–1.9)

## 2015-11-04 LAB — PROCALCITONIN: Procalcitonin: 0.13 ng/mL

## 2015-11-04 MED ORDER — TAMSULOSIN HCL 0.4 MG PO CAPS
0.4000 mg | ORAL_CAPSULE | ORAL | Status: DC
  Administered 2015-11-06: 0.4 mg via ORAL
  Filled 2015-11-04: qty 1

## 2015-11-04 MED ORDER — ACETAMINOPHEN 325 MG PO TABS
650.0000 mg | ORAL_TABLET | Freq: Four times a day (QID) | ORAL | Status: DC | PRN
  Administered 2015-11-06: 650 mg via ORAL
  Filled 2015-11-04: qty 2

## 2015-11-04 MED ORDER — ACETAMINOPHEN 650 MG RE SUPP: 650.0000 mg | Freq: Four times a day (QID) | RECTAL | Status: DC | PRN

## 2015-11-04 MED ORDER — METOPROLOL TARTRATE 12.5 MG HALF TABLET
12.5000 mg | ORAL_TABLET | Freq: Two times a day (BID) | ORAL | Status: DC
  Administered 2015-11-04 – 2015-11-07 (×6): 12.5 mg via ORAL
  Filled 2015-11-04 (×6): qty 1

## 2015-11-04 MED ORDER — SODIUM CHLORIDE 0.9 % IV BOLUS (SEPSIS)
1500.0000 mL | Freq: Once | INTRAVENOUS | Status: AC
  Administered 2015-11-04: 1500 mL via INTRAVENOUS

## 2015-11-04 MED ORDER — ONDANSETRON HCL 4 MG/2ML IJ SOLN: 4.0000 mg | Freq: Four times a day (QID) | INTRAMUSCULAR | Status: DC | PRN

## 2015-11-04 MED ORDER — DEXTROSE 5 % IV SOLN
2.0000 g | INTRAVENOUS | Status: DC
  Administered 2015-11-05: 2 g via INTRAVENOUS
  Filled 2015-11-04 (×2): qty 2

## 2015-11-04 MED ORDER — CLOPIDOGREL BISULFATE 75 MG PO TABS
75.0000 mg | ORAL_TABLET | Freq: Every day | ORAL | Status: DC
  Administered 2015-11-05 – 2015-11-07 (×3): 75 mg via ORAL
  Filled 2015-11-04 (×3): qty 1

## 2015-11-04 MED ORDER — DOCUSATE SODIUM 100 MG PO CAPS
100.0000 mg | ORAL_CAPSULE | Freq: Two times a day (BID) | ORAL | Status: DC
  Administered 2015-11-04 – 2015-11-07 (×6): 100 mg via ORAL
  Filled 2015-11-04 (×6): qty 1

## 2015-11-04 MED ORDER — INSULIN ASPART 100 UNIT/ML ~~LOC~~ SOLN
0.0000 [IU] | Freq: Three times a day (TID) | SUBCUTANEOUS | Status: DC
  Administered 2015-11-05 – 2015-11-06 (×2): 2 [IU] via SUBCUTANEOUS

## 2015-11-04 MED ORDER — DEXTROSE 5 % IV SOLN
1.0000 g | Freq: Once | INTRAVENOUS | Status: AC
  Administered 2015-11-04: 1 g via INTRAVENOUS
  Filled 2015-11-04: qty 10

## 2015-11-04 MED ORDER — ONDANSETRON HCL 4 MG PO TABS: 4.0000 mg | ORAL_TABLET | Freq: Four times a day (QID) | ORAL | Status: DC | PRN

## 2015-11-04 MED ORDER — DEXTROSE 5 % IV SOLN
2.0000 g | Freq: Once | INTRAVENOUS | Status: AC
  Administered 2015-11-04: 2 g via INTRAVENOUS
  Filled 2015-11-04: qty 2

## 2015-11-04 MED ORDER — ENOXAPARIN SODIUM 40 MG/0.4ML ~~LOC~~ SOLN
40.0000 mg | SUBCUTANEOUS | Status: DC
  Administered 2015-11-04 – 2015-11-06 (×3): 40 mg via SUBCUTANEOUS
  Filled 2015-11-04 (×3): qty 0.4

## 2015-11-04 MED ORDER — ATORVASTATIN CALCIUM 80 MG PO TABS
80.0000 mg | ORAL_TABLET | Freq: Every day | ORAL | Status: DC
  Administered 2015-11-06: 80 mg via ORAL
  Filled 2015-11-04: qty 1

## 2015-11-04 MED ORDER — LACTATED RINGERS IV SOLN
INTRAVENOUS | Status: AC
Start: 2015-11-04 — End: 2015-11-05
  Administered 2015-11-04: 21:00:00 via INTRAVENOUS

## 2015-11-04 NOTE — ED Provider Notes (Signed)
[facility] DEPT Provider Note   CSN: [id number] Arrival date & time: 11/04/15  1446   History   Chief Complaint Chief Complaint  Patient presents with  . Urinary Frequency    HPI Herbert Marquez is a 82 y.o. male.  The history is provided by the patient, medical records and a relative.   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 Onset was yesterday. Worsening since then. Denies associated dysuria, hematuria, flank pain, abdominal pain. Does endorse fever and rigors, chills. Alleviated by Tylenol. Similar to prior UTIs. Patient states he had a bad UTI while he was in a stepdown unit here after his stroke. Has also had a bladder infection that was treated at home about a month ago.   Past Medical History:  Diagnosis Date  . Arthritis    "pretty much all over"   . Basal cell carcinoma    "several burned off his body, face, head"  . BPH (benign prostatic hypertrophy)   . Coronary artery disease    a. s/p PCI of RCA in 2006  . CVA (cerebral infarction)    a. 06/2015: left thalamic and bilateral PCA  . GERD (gastroesophageal reflux disease)   . Hyperlipidemia   . Hypertension   . Stroke ([place])   . TIA (transient ischemic attack)    Approximately 6 weeks post-cardiac catheterization.   . Type II diabetes mellitus ([place])    "prediabetic; lost alot of weight; not diabetic now" (06/16/2015)    Patient Active Problem List   Diagnosis Date Noted  . Palpitations 09/18/2015  . Cerebrovascular accident (CVA) due to bilateral occlusion of posterior cerebral arteries ([place]) 09/18/2015  . DM type 2 with diabetic peripheral neuropathy ([place])   . Gait disturbance, post-stroke 06/23/2015  . 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 Alterations of sensations following CVA (cerebrovascular accident) 06/23/2015  . Thalamic infarction ([place])  06/21/2015  . Sinus tachycardia ([place]) 06/20/2015  . Benign essential HTN   . Type 2 diabetes mellitus with complication, without long-term current use of insulin ([place])   . Coronary artery disease involving native coronary artery of native heart without angina pectoris   . Dysphagia as late effect of cerebrovascular disease   . Low grade fever   . Leukocytosis   . Hypokalemia   . Hyponatremia   . AKI (acute kidney injury) ([place])   . PSVT (paroxysmal supraventricular tachycardia) ([place])   . Cerebral thrombosis with cerebral infarction 06/17/2015  . Acute CVA (cerebrovascular accident) ([place]) 06/17/2015  . Stroke (cerebrum) ([place])   . HLD (hyperlipidemia)   . Syncope 06/16/2015  . Hyperlipidemia 04/22/2014  . UTI (lower urinary tract infection) 08/31/2011  . Acute renal failure ([place]) 08/31/2011  . Fracture of femoral neck, left ([place]) 03/09/2011  . DM (diabetes mellitus) ([place]) 03/09/2011  . Obesity 03/09/2011  . COPD (chronic obstructive pulmonary disease) ([place]) 03/09/2011  . Essential hypertension 03/09/2011  . CAD (coronary artery disease) 09/20/2010  . Diabetes mellitus 09/20/2010  . HTN (hypertension) 09/20/2010    Past Surgical History:  Procedure Laterality Date  . CARDIOVASCULAR STRESS TEST  07/01/2007   EF 74%  . CATARACT EXTRACTION, BILATERAL    . CORONARY ANGIOPLASTY WITH STENT PLACEMENT  10/2004   stenting x 2 to RCA  . HERNIA REPAIR    . HIP ARTHROPLASTY  03/09/2011   Procedure: ARTHROPLASTY BIPOLAR HIP;  Surgeon: Mauri Pole;  Location: [facility] ORS;  Service: Orthopedics;  Laterality: Left;  . LAPAROSCOPIC INCISIONAL / UMBILICAL / VENTRAL HERNIA REPAIR     "below his naval"       Home Medications    Prior to Admission medications   Medication Sig Start Date End Date Taking? Authorizing Provider  atorvastatin (LIPITOR) 80 MG tablet Take 1 tablet (80 mg total) by mouth daily. 07/08/15  Yes Daniel J Angiulli, PA-C  clopidogrel (PLAVIX) 75 MG tablet Take 1 tablet (75 mg  total) by mouth daily. 07/08/15  Yes Daniel J Angiulli, PA-C  metoprolol tartrate (LOPRESSOR) 25 MG tablet Take 0.5 tablets (12.5 mg total) by mouth 2 (two) times daily. 07/08/15  Yes Daniel J Angiulli, PA-C  Multiple Vitamin (MULTIVITAMIN) tablet Take 1 tablet by mouth daily.    Yes Historical Provider, MD  nitroGLYCERIN (NITROSTAT) 0.4 MG SL tablet Place 0.4 mg under the tongue every 5 (five) minutes as needed for chest pain.   Yes Historical Provider, MD  pioglitazone (ACTOS) 30 MG tablet Take 0.5 tablets (15 mg total) by mouth daily. 07/08/15  Yes Daniel J Angiulli, PA-C  tamsulosin (FLOMAX) 0.4 MG CAPS capsule Take 0.4 mg by mouth every other day.    Yes Historical Provider, MD    Family History Family History  Problem Relation Age of Onset  . Hypertension Mother   . Lung cancer Father   . Lung cancer Brother     Social History Social History  Substance Use Topics  . Smoking status: Former Smoker    Quit date: 03/06/1971  . Smokeless tobacco: Never Used  . Alcohol use 1.2 oz/week    1 Glasses of wine, 1 Cans of beer per week     Comment: 1/2 BEER AND 1/2 WINE     Allergies   Review of patient's allergies indicates no known allergies.   Review of Systems Review of Systems  Constitutional: Positive for chills and fever.  HENT: Negative for sore throat.   Respiratory: Negative for cough and shortness of breath.   Cardiovascular: Negative for chest pain.  Gastrointestinal: Negative for abdominal pain, diarrhea and vomiting.  Genitourinary: Positive for frequency. Negative for dysuria, flank pain and hematuria.  Musculoskeletal: Negative for back pain.  Skin: Negative for rash.  Allergic/Immunologic: Negative for immunocompromised state.  All other systems reviewed and are negative.    Physical Exam Updated Vital Signs BP 117/62   Pulse 74   Temp 101.6 F (38.7 C) (Rectal)   Resp 21   Ht 5\' 7"  (1.702 m)   Wt 80.7 kg   SpO2 93%   BMI 27.88 kg/m   Physical Exam    Constitutional: He appears well-developed and well-nourished.  HENT:  Head: Normocephalic and atraumatic.  Eyes: Conjunctivae are normal.  Neck: Neck supple.  Cardiovascular: Normal rate and regular rhythm.   Pulmonary/Chest: Effort normal and breath sounds normal. No respiratory distress.  Abdominal: Soft. There is no tenderness.  No CVAT   Musculoskeletal: He exhibits no edema.  Neurological: He is alert.  Skin: Skin is warm and dry.  Psychiatric: He has a normal mood and affect.  Nursing note and vitals reviewed.    ED Treatments / Results  Labs (all labs ordered are listed, but only abnormal results are displayed) Labs Reviewed  COMPREHENSIVE METABOLIC PANEL - Abnormal; Notable for the following:       Result Value   Sodium 130 (*)    Chloride 100 (*)    CO2 19 (*)    Glucose,  Bld 105 (*)    Total Protein 5.9 (*)    ALT 15 (*)    GFR calc non Af Amer 52 (*)    All other components within normal limits  CBC WITH DIFFERENTIAL/PLATELET - Abnormal; Notable for the following:    WBC 15.1 (*)    RBC 4.00 (*)    Hemoglobin 11.8 (*)    HCT 36.2 (*)    RDW 15.8 (*)    Neutro Abs 13.1 (*)    Monocytes Absolute 1.2 (*)    All other components within normal limits  URINALYSIS, ROUTINE W REFLEX MICROSCOPIC (NOT AT [facility]) - Abnormal; Notable for the following:    APPearance CLOUDY (*)    Ketones, ur 40 (*)    Protein, ur 30 (*)    Leukocytes, UA MODERATE (*)    All other components within normal limits  URINE MICROSCOPIC-ADD ON - Abnormal; Notable for the following:    Bacteria, UA RARE (*)    All other components within normal limits  CULTURE, BLOOD (ROUTINE X 2)  CULTURE, BLOOD (ROUTINE X 2)  URINE CULTURE  I-STAT CG4 LACTIC ACID, ED  I-STAT CG4 LACTIC ACID, ED    EKG  EKG Interpretation None       Radiology No results found.  Procedures Procedures (including critical care time)  Medications Ordered in ED Medications  sodium chloride 0.9 % bolus 1,500  mL (1,500 mLs Intravenous New Bag/Given 11/04/15 1620)  cefTRIAXone (ROCEPHIN) 1 g in dextrose 5 % 50 mL IVPB (0 g Intravenous Stopped 11/04/15 1716)     Initial Impression / Assessment and Plan / ED Course  I have reviewed the triage vital signs and the nursing notes.  Pertinent labs & imaging results that were available during my care of the patient were reviewed by me and considered in my medical decision making (see chart for details).  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 Presentation concerning for sepsis 2/2 urinary tract infection. Pt with leukocytosis, UA concerning for infection, CO2 19 but no lactic acidosis, c/w sepsis. Cultures drawn and pt started empirically on rocephin given multiple species on prior urine cx. 1.5L IVF given in ED. Admitted for further care in stable condition.    Case discussed with Dr. Laneta Simmers who oversaw management of this patient.   Final Clinical Impressions(s) / ED Diagnoses   Final diagnoses:  UTI (lower urinary tract infection)  Sepsis, due to unspecified organism [facility])  Urinary frequency    New Prescriptions New Prescriptions   No medications on file     Ivin Booty, MD 11/05/15 2145    Leo Grosser, MD 11/05/15 2337

## 2015-11-04 NOTE — ED Notes (Signed)
Admitting MD at the bedside.  

## 2015-11-04 NOTE — Progress Notes (Signed)
Pharmacy Antibiotic Note  Herbert Marquez is a 82 y.o. male admitted on 11/04/2015 with Urosepsis. Pharmacy has been consulted for cefepime dosing. Scr 1.21, est. crcl ~ 40 ml/min, Tm 101.6, wbc 15K  Plan: Cefepime 2g IV Q 24 hrs F/u cultures and monitor renal function  Height: 5\' 7"  (170.2 cm) Weight: 172 lb 13.5 oz (78.4 kg) IBW/kg (Calculated) : 66.1  Temp (24hrs), Avg:99.6 F (37.6 C), Min:97.8 F (36.6 C), Max:101.6 F (38.7 C)   Recent Labs Lab 11/04/15 1545 11/04/15 1552 11/04/15 1752  WBC 15.1*  --   --   CREATININE 1.21  --   --   LATICACIDVEN  --  1.34 0.61    Estimated Creatinine Clearance: 41 mL/min (by C-G formula based on SCr of 1.21 mg/dL).    No Known Allergies  Antimicrobials this admission: Cefepime 9/1 >> Rocephin 9/1 x 1   Dose adjustments this admission:   Microbiology results: 9/1 BCx:  9/1 UCx:   Thank you for allowing pharmacy to be a part of this patient's care.  Manley Mason 11/04/2015 9:29 PM

## 2015-11-04 NOTE — H&P (Signed)
History and Physical    Herbert Marquez [id number] DOB: 05/23/1927 DOA: 11/04/2015  PCP: Thressa Sheller, MD Consultants:  Letta Pate - PM&R; Erlinda Hong - Neurology; Neurosurgery; Urology Patient coming from: home - lives with wife  Chief Complaint: urinary symptoms, fever  HPI: Herbert Marquez is a 82 y.o. male with medical history significant of CVA in April, still undergoing home therapy.  Ever since, he has had urinary frequency.  Last night was worse and this AM, it was even worse.  Also "freezing to death."  Shivering.  Fever - subjective.  +hesitancy and thinks he finishes when he hasn't.  Did have change to meds recently and it didn't help.    CVA April (hospitalized 4/13-18; inpatient rehab through 5/5, outpatient since) - now able to walk with a cane through the house.  Ongoing hemiparesis on the right - leg > arm.  Able to perform own ADLs.    Recently finished 30 day Holter and it went well by report (7/19 - negative for afib).   ED Course: Per Dr. 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   Given 1.5L bolus for sepsis protocol and Rocephin 1 gram IV  Review of Systems: As per HPI; otherwise 10 point review of systems reviewed and negative.    Past Medical History:  Diagnosis Date  . Arthritis    "pretty much all over"   . Basal cell carcinoma    "several burned off his body, face, head"  . BPH (benign prostatic hypertrophy)   . Coronary artery disease    a. s/p PCI of RCA in 2006  . CVA (cerebral infarction)    a. 06/2015: left thalamic and bilateral PCA  . GERD (gastroesophageal reflux disease)   . Hyperlipidemia   . Hypertension   . TIA (transient ischemic attack)    Approximately 6 weeks post-cardiac catheterization.   . Type II diabetes mellitus ([place])    "prediabetic; lost alot of  weight; not diabetic now" (06/16/2015)    Past Surgical History:  Procedure Laterality Date  . CARDIOVASCULAR STRESS TEST  07/01/2007   EF 74%  . CATARACT EXTRACTION, BILATERAL    . CORONARY ANGIOPLASTY WITH STENT PLACEMENT  10/2004   stenting x 2 to RCA  . HERNIA REPAIR    . HIP ARTHROPLASTY  03/09/2011   Procedure: ARTHROPLASTY BIPOLAR HIP;  Surgeon: Mauri Pole;  Location: [facility] ORS;  Service: Orthopedics;  Laterality: Left;  . LAPAROSCOPIC INCISIONAL / UMBILICAL / VENTRAL HERNIA REPAIR     "below his naval"    Social History   Social History  . Marital status: Married    Spouse name: N/A  . Number of children: N/A  . Years of education: N/A   Occupational History  . Retired Other   Social History Main Topics  . Smoking status: Former Smoker    Quit date: 03/06/1971  . Smokeless tobacco: Never Used  . Alcohol use 1.2 oz/week    1 Glasses of wine, 1 Cans of beer per week     Comment: 1/2 BEER AND 1 WINE  . Drug use: No  . Sexual activity: No   Other Topics Concern  . Not on file   Social History Narrative   Lives in [place], [place] with wife. Has 3 children.     No Known Allergies  Family History  Problem Relation Age  of Onset  . Hypertension Mother   . Lung cancer Father   . Lung cancer Brother     Prior to Admission medications   Medication Sig Start Date End Date Taking? Authorizing Provider  atorvastatin (LIPITOR) 80 MG tablet Take 1 tablet (80 mg total) by mouth daily. 07/08/15  Yes Daniel J Angiulli, PA-C  clopidogrel (PLAVIX) 75 MG tablet Take 1 tablet (75 mg total) by mouth daily. 07/08/15  Yes Daniel J Angiulli, PA-C  metoprolol tartrate (LOPRESSOR) 25 MG tablet Take 0.5 tablets (12.5 mg total) by mouth 2 (two) times daily. 07/08/15  Yes Daniel J Angiulli, PA-C  Multiple Vitamin (MULTIVITAMIN) tablet Take 1 tablet by mouth daily.    Yes Historical Provider, MD  nitroGLYCERIN (NITROSTAT) 0.4 MG SL tablet Place 0.4 mg under the tongue every 5 (five)  minutes as needed for chest pain.   Yes Historical Provider, MD  pioglitazone (ACTOS) 30 MG tablet Take 0.5 tablets (15 mg total) by mouth daily. 07/08/15  Yes Daniel J Angiulli, PA-C  tamsulosin (FLOMAX) 0.4 MG CAPS capsule Take 0.4 mg by mouth every other day.    Yes Historical Provider, MD    Physical Exam: Vitals:   11/04/15 1800 11/04/15 1820 11/04/15 1935 11/04/15 2016  BP: 117/80 151/99 117/80 124/89  Pulse: 75 70 83 99  Resp: 19 16 16 18   Temp:   98.9 F (37.2 C) 97.8 F (36.6 C)  TempSrc:   Oral Oral  SpO2: 94% 96% 94% 99%  Weight:    78.4 kg (172 lb 13.5 oz)  Height:    5\' 7"  (1.702 m)     General:  Appears calm and comfortable and is NAD Eyes:  PERRL, EOMI, normal lids, iris ENT:  Hard of hearing with hearing aids in place, normal lips & tongue, mmm Neck:  no LAD, masses or thyromegaly Cardiovascular:  RRR, no m/r/g. No LE edema.  Respiratory:  CTA bilaterally, no w/r/r. Normal respiratory effort. Abdomen:  soft, ntnd, NABS Skin:  no rash or induration seen on limited exam Musculoskeletal:  grossly normal tone BUE/BLE, good ROM, no bony abnormality Psychiatric:  grossly normal mood and affect, speech fluent and appropriate, AOx3 Neurologic:  CN 2-12 grossly intact, moves all extremities in coordinated fashion, sensation intact  Labs on Admission: I have personally reviewed following labs and imaging studies  CBC:  Recent Labs Lab 11/04/15 1545  WBC 15.1*  NEUTROABS 13.1*  HGB 11.8*  HCT 36.2*  MCV 90.5  PLT 123XX123   Basic Metabolic Panel:  Recent Labs Lab 11/04/15 1545  NA 130*  K 3.8  CL 100*  CO2 19*  GLUCOSE 105*  BUN 12  CREATININE 1.21  CALCIUM 9.2   GFR: Estimated Creatinine Clearance: 41 mL/min (by C-G formula based on SCr of 1.21 mg/dL). Liver Function Tests:  Recent Labs Lab 11/04/15 1545  AST 34  ALT 15*  ALKPHOS 61  BILITOT 0.8  PROT 5.9*  ALBUMIN 3.5   No results for input(s): LIPASE, AMYLASE in the last 168 hours. No  results for input(s): AMMONIA in the last 168 hours. Coagulation Profile:  Recent Labs Lab 11/04/15 2121  INR 1.27   Cardiac Enzymes: No results for input(s): CKTOTAL, CKMB, CKMBINDEX, TROPONINI in the last 168 hours. BNP (last 3 results) No results for input(s): PROBNP in the last 8760 hours. HbA1C: No results for input(s): HGBA1C in the last 72 hours. CBG: No results for input(s): GLUCAP in the last 168 hours. Lipid Profile: No results for input(s):  CHOL, HDL, LDLCALC, TRIG, CHOLHDL, LDLDIRECT in the last 72 hours. Thyroid Function Tests: No results for input(s): TSH, T4TOTAL, FREET4, T3FREE, THYROIDAB in the last 72 hours. Anemia Panel: No results for input(s): VITAMINB12, FOLATE, FERRITIN, TIBC, IRON, RETICCTPCT in the last 72 hours. Urine analysis:    Component Value Date/Time   COLORURINE YELLOW 11/04/2015 1645   APPEARANCEUR CLOUDY (A) 11/04/2015 1645   LABSPEC 1.019 11/04/2015 1645   PHURINE 8.0 11/04/2015 1645   GLUCOSEU NEGATIVE 11/04/2015 1645   HGBUR NEGATIVE 11/04/2015 1645   BILIRUBINUR NEGATIVE 11/04/2015 1645   KETONESUR 40 (A) 11/04/2015 1645   PROTEINUR 30 (A) 11/04/2015 1645   UROBILINOGEN 0.2 08/31/2011 1033   NITRITE NEGATIVE 11/04/2015 1645   LEUKOCYTESUR MODERATE (A) 11/04/2015 1645    Creatinine Clearance: Estimated Creatinine Clearance: 41 mL/min (by C-G formula based on SCr of 1.21 mg/dL).  Sepsis Labs: @LABRCNTIP (procalcitonin:4,lacticidven:4) )No results found for this or any previous visit (from the past 240 hour(s)).   Radiological Exams on Admission: No results found.  EKG: Not done  Assessment/Plan Principal Problem:   Sepsis ([place]) Active Problems:   UTI (lower urinary tract infection)   HLD (hyperlipidemia)   Benign essential HTN   Type 2 diabetes mellitus with complication, without long-term current use of insulin ([facility])   Coronary artery disease involving native coronary artery of native heart without angina pectoris   H/O:  CVA (cerebrovascular accident)   Sepsis from a urinary source -Temp to 101.6, HR to 99 (despite Lopressor), Elevated WBC count, with normal lactate and borderline hypotension (as low as 100/50) -Sepsis protocol initiated;received all of IVF bolus and started on abx (Rocephin) and reports already feeling better -Suspect urinary source based on symptoms as well as somewhat abnormal UA -Blood and urine cultures pending -Prior urine cultures indicated contamination -Will place in observation status and continue to monitor -Treat with IV Cefepime for hospital-associated UTI - this may be overkill since the hospitalization was technically >90 days ago, but the patient has continued with rehab and so has had ongoing healthcare exposure so will err on the side of caution -If patient responds quickly, he may be appropriate for discharge in the next 24-48 hours  HLD -Continue Lipitor  CAD/s/p CVA -Continue Plavix -Has right-sided hemiparesis, will place on fall precautions  DM -Hold Actos and treat with SSI  HTN -Continue Lopressor   DVT prophylaxis:  Lovenox Code Status:  Full - confirmed with patient/family Family Communication: Multiple family members at bedside during evaluation  Disposition Plan:  Home once clinically improved Consults called: None  Admission status: Observation, Med Surg    Karmen Bongo MD [facility]  If 7PM-7AM, please contact night-coverage [email or web address] Password [facility]  11/04/2015, 9:47 PM

## 2015-11-04 NOTE — ED Notes (Signed)
Pt wheeled back to room from waiting room. Placed in gown and on monitor. Pt's temp. Rectally 101.6 Nurse was notified.

## 2015-11-04 NOTE — ED Triage Notes (Signed)
Pt presents with report urinary frequency that began last night, and 100.7 fever prior to arrival.  Wife gave pt tylenol.  Pt denies any dysuria or foul colored urine.  Pt had CVA in April with R sided weakness.

## 2015-11-05 DIAGNOSIS — Z79899 Other long term (current) drug therapy: Secondary | ICD-10-CM | POA: Diagnosis not present

## 2015-11-05 DIAGNOSIS — Z87891 Personal history of nicotine dependence: Secondary | ICD-10-CM | POA: Diagnosis not present

## 2015-11-05 DIAGNOSIS — N400 Enlarged prostate without lower urinary tract symptoms: Secondary | ICD-10-CM | POA: Diagnosis present

## 2015-11-05 DIAGNOSIS — J449 Chronic obstructive pulmonary disease, unspecified: Secondary | ICD-10-CM | POA: Diagnosis present

## 2015-11-05 DIAGNOSIS — N3281 Overactive bladder: Secondary | ICD-10-CM | POA: Diagnosis present

## 2015-11-05 DIAGNOSIS — Z96642 Presence of left artificial hip joint: Secondary | ICD-10-CM | POA: Diagnosis present

## 2015-11-05 DIAGNOSIS — I69351 Hemiplegia and hemiparesis following cerebral infarction affecting right dominant side: Secondary | ICD-10-CM | POA: Diagnosis not present

## 2015-11-05 DIAGNOSIS — Z8249 Family history of ischemic heart disease and other diseases of the circulatory system: Secondary | ICD-10-CM | POA: Diagnosis not present

## 2015-11-05 DIAGNOSIS — A419 Sepsis, unspecified organism: Secondary | ICD-10-CM | POA: Diagnosis not present

## 2015-11-05 DIAGNOSIS — I69393 Ataxia following cerebral infarction: Secondary | ICD-10-CM | POA: Diagnosis not present

## 2015-11-05 DIAGNOSIS — Z7902 Long term (current) use of antithrombotics/antiplatelets: Secondary | ICD-10-CM | POA: Diagnosis not present

## 2015-11-05 DIAGNOSIS — R350 Frequency of micturition: Secondary | ICD-10-CM | POA: Diagnosis present

## 2015-11-05 DIAGNOSIS — Z955 Presence of coronary angioplasty implant and graft: Secondary | ICD-10-CM | POA: Diagnosis not present

## 2015-11-05 DIAGNOSIS — I10 Essential (primary) hypertension: Secondary | ICD-10-CM | POA: Diagnosis not present

## 2015-11-05 DIAGNOSIS — K219 Gastro-esophageal reflux disease without esophagitis: Secondary | ICD-10-CM | POA: Diagnosis present

## 2015-11-05 DIAGNOSIS — E785 Hyperlipidemia, unspecified: Secondary | ICD-10-CM | POA: Diagnosis not present

## 2015-11-05 DIAGNOSIS — Z85828 Personal history of other malignant neoplasm of skin: Secondary | ICD-10-CM | POA: Diagnosis not present

## 2015-11-05 DIAGNOSIS — A498 Other bacterial infections of unspecified site: Secondary | ICD-10-CM | POA: Diagnosis not present

## 2015-11-05 DIAGNOSIS — N281 Cyst of kidney, acquired: Secondary | ICD-10-CM | POA: Diagnosis present

## 2015-11-05 DIAGNOSIS — Z801 Family history of malignant neoplasm of trachea, bronchus and lung: Secondary | ICD-10-CM | POA: Diagnosis not present

## 2015-11-05 DIAGNOSIS — I2510 Atherosclerotic heart disease of native coronary artery without angina pectoris: Secondary | ICD-10-CM | POA: Diagnosis not present

## 2015-11-05 DIAGNOSIS — N390 Urinary tract infection, site not specified: Secondary | ICD-10-CM | POA: Diagnosis present

## 2015-11-05 DIAGNOSIS — E1142 Type 2 diabetes mellitus with diabetic polyneuropathy: Secondary | ICD-10-CM | POA: Diagnosis present

## 2015-11-05 DIAGNOSIS — Z87440 Personal history of urinary (tract) infections: Secondary | ICD-10-CM | POA: Diagnosis not present

## 2015-11-05 LAB — CBC
HCT: 32.3 % — ABNORMAL LOW (ref 39.0–52.0)
Hemoglobin: 10.3 g/dL — ABNORMAL LOW (ref 13.0–17.0)
MCH: 29.2 pg (ref 26.0–34.0)
MCHC: 31.9 g/dL (ref 30.0–36.0)
MCV: 91.5 fL (ref 78.0–100.0)
Platelets: 295 10*3/uL (ref 150–400)
RBC: 3.53 MIL/uL — ABNORMAL LOW (ref 4.22–5.81)
RDW: 16.1 % — ABNORMAL HIGH (ref 11.5–15.5)
WBC: 18.5 10*3/uL — ABNORMAL HIGH (ref 4.0–10.5)

## 2015-11-05 LAB — BASIC METABOLIC PANEL
Anion gap: 8 (ref 5–15)
BUN: 11 mg/dL (ref 6–20)
CO2: 21 mmol/L — ABNORMAL LOW (ref 22–32)
Calcium: 8.6 mg/dL — ABNORMAL LOW (ref 8.9–10.3)
Chloride: 103 mmol/L (ref 101–111)
Creatinine, Ser: 1.12 mg/dL (ref 0.61–1.24)
GFR calc Af Amer: >60 mL/min (ref >60)
GFR calc non Af Amer: 57 mL/min — ABNORMAL LOW (ref >60)
Glucose, Bld: 102 mg/dL — ABNORMAL HIGH (ref 65–99)
Potassium: 3.4 mmol/L — ABNORMAL LOW (ref 3.5–5.1)
Sodium: 132 mmol/L — ABNORMAL LOW (ref 135–145)

## 2015-11-05 LAB — GLUCOSE, CAPILLARY
Glucose-Capillary: 105 mg/dL — ABNORMAL HIGH (ref 65–99)
Glucose-Capillary: 114 mg/dL — ABNORMAL HIGH (ref 65–99)
Glucose-Capillary: 121 mg/dL — ABNORMAL HIGH (ref 65–99)
Glucose-Capillary: 111 mg/dL — ABNORMAL HIGH (ref 65–99)

## 2015-11-05 MED ORDER — POTASSIUM CHLORIDE CRYS ER 20 MEQ PO TBCR
40.0000 meq | EXTENDED_RELEASE_TABLET | Freq: Once | ORAL | Status: AC
  Administered 2015-11-05: 40 meq via ORAL
  Filled 2015-11-05: qty 2

## 2015-11-05 NOTE — Progress Notes (Signed)
PROGRESS NOTE    Herbert Marquez  [id number] DOB: [birth year] DOA: 11/04/2015 PCP: Thressa Sheller, MD     Brief Narrative:  82 y.o. WM PMHx TIA, CVA in April, still undergoing home therapy,Basal cell carcinoma multiple areas of body, HTN, HLD, DM type II controlled with complications.  Ever since, he has had urinary frequency.  Last night was worse and this AM, it was even worse.  Also "freezing to death."  Shivering.  Fever - subjective.  +hesitancy and thinks he finishes when he hasn't.  Did have change to meds recently and it didn't help.    CVA April (hospitalized 4/13-18; inpatient rehab through 5/5, outpatient since) - now able to walk with a cane through the house.  Ongoing hemiparesis on the right - leg > arm.  Able to perform own ADLs.    Recently finished 30 day Holter and it went well by report (7/19 - negative for afib).  Subjective: 9/2 A/O 4, NAD. States has been having urinary frequency chronically. Seen by urology ([facility]?), Who made medication changes. States usually goes every 2-3 hours. States bladder scan recently performed which showed good emptying, unsure if he had kidney ultrasound performed. MAXIMUM TEMPERATURE last 24 hours 38.7C    Assessment & Plan:   Principal Problem:   Sepsis ([place]) Active Problems:   UTI (lower urinary tract infection)   HLD (hyperlipidemia)   Benign essential HTN   Type 2 diabetes mellitus with complication, without long-term current use of insulin (HCC)   Coronary artery disease involving native coronary artery of native heart without angina pectoris   H/O: CVA (cerebrovascular accident)   Sepsis unspecified organism (UTI?)  -Temp to 101.6, HR to 99 (despite Lopressor), Elevated WBC count, with normal lactate and borderline hypotension (as low as 100/50) -Suspect urinary source based on symptoms as well as somewhat abnormal UA -Blood and urine cultures pending -Treat with IV Cefepime for hospital-associated UTI - this may be  overkill since the hospitalization was technically >90 days ago, but the patient has continued with rehab and so has had ongoing healthcare exposure so will err on the side of caution --Renal ultrasound 4/17 showed right renal cyst C stable -Unable to see findings from [facility]. -9/1 PSA= 8: In addition patient states when his prostate was/checked was told just mild enlargement.  Overactive bladder -Will continue Flomax 0.4 mg daily to ensure complete bladder emptying. -If no improvement with UTI treatment may start Ditropan 5 mg daily  HLD -Continue Lipitor 80 mg daily  CAD/s/p CVA -Continue Plavix 75 mg daily -Has right-sided hemiparesis, will place on fall precautions  DM type II controlled with complications -0000000 Hemoglobin A1c = 5.6  -Hold Actos -Moderate SSI   HTN -Continue Lopressor 12.5 mg BID   DVT prophylaxis: Lovenox Code Status: Full Family Communication: Son and daughter at bedside Disposition Plan: If patient responds quickly, he may be appropriate for discharge in the next 24-48 hours    Consultants:  None  Procedures/Significant Events:  4/17 renal ultrasound:. -Bilateral renal cortical thinning.-No hydronephrosis. -18 mm lower pole right renal cyst   Cultures 9/1 urine pending 9/1 blood 2 pending   Antimicrobials: Cefepime 9/1>> Ceftriaxone 9/11 dose   Devices    LINES / TUBES:      Continuous Infusions:    Objective: Vitals:   11/04/15 1820 11/04/15 1935 11/04/15 2016 11/05/15 0514  BP: 151/99 117/80 124/89 (!) 103/54  Pulse: 70 83 99 62  Resp: 16 16 18 18   Temp:  98.9  F (37.2 C) 97.8 F (36.6 C) 98.6 F (37 C)  TempSrc:  Oral Oral Oral  SpO2: 96% 94% 99% 98%  Weight:   78.4 kg (172 lb 13.5 oz)   Height:   5\' 7"  (1.702 m)     Intake/Output Summary (Last 24 hours) at 11/05/15 0911 Last data filed at 11/05/15 [id number]  Gross per 24 hour  Intake          1821.67 ml  Output              550 ml  Net           1271.67 ml   Filed Weights   11/04/15 1455 11/04/15 2016  Weight: 80.7 kg (178 lb) 78.4 kg (172 lb 13.5 oz)    Examination:  General: A/O 4, NAD, No acute respiratory distress Eyes: negative scleral hemorrhage, negative anisocoria, negative icterus ENT: Negative Runny nose, negative gingival bleeding, Neck:  Negative scars, masses, torticollis, lymphadenopathy, JVD Lungs: Clear to auscultation bilaterally without wheezes or crackles Cardiovascular: Regular rate and rhythm without murmur gallop or rub normal S1 and S2 Abdomen: negative abdominal pain, nondistended, positive soft, bowel sounds, no rebound, no ascites, no appreciable mass, negative CVA tenderness Extremities: No significant cyanosis, clubbing, or edema bilateral lower extremities Skin: Negative rashes, lesions, ulcers Psychiatric:  Negative depression, negative anxiety, negative fatigue, negative mania  Central nervous system:  Cranial nerves II through XII intact, tongue/uvula midline, all extremities muscle strength 5/5, sensation intact throughout,  negative dysarthria, negative expressive aphasia, negative receptive aphasia.  .     Data Reviewed: Care during the described time interval was provided by me .  I have reviewed this patient's available data, including medical history, events of note, physical examination, and all test results as part of my evaluation. I have personally reviewed and interpreted all radiology studies.  CBC:  Recent Labs Lab 11/04/15 1545 11/05/15 0345  WBC 15.1* 18.5*  NEUTROABS 13.1*  --   HGB 11.8* 10.3*  HCT 36.2* 32.3*  MCV 90.5 91.5  PLT 299 [id number]   Basic Metabolic Panel:  Recent Labs Lab 11/04/15 1545 11/05/15 0345  NA 130* 132*  K 3.8 3.4*  CL 100* 103  CO2 19* 21*  GLUCOSE 105* 102*  BUN 12 11  CREATININE 1.21 1.12  CALCIUM 9.2 8.6*   GFR: Estimated Creatinine Clearance: 44.3 mL/min (by C-G formula based on SCr of 1.12 mg/dL). Liver Function Tests:  Recent  Labs Lab 11/04/15 1545  AST 34  ALT 15*  ALKPHOS 61  BILITOT 0.8  PROT 5.9*  ALBUMIN 3.5   No results for input(s): LIPASE, AMYLASE in the last 168 hours. No results for input(s): AMMONIA in the last 168 hours. Coagulation Profile:  Recent Labs Lab 11/04/15 2121  INR 1.27   Cardiac Enzymes: No results for input(s): CKTOTAL, CKMB, CKMBINDEX, TROPONINI in the last 168 hours. BNP (last 3 results) No results for input(s): PROBNP in the last 8760 hours. HbA1C: No results for input(s): HGBA1C in the last 72 hours. CBG:  Recent Labs Lab 11/04/15 2149 11/05/15 0745  GLUCAP 125* 105*   Lipid Profile: No results for input(s): CHOL, HDL, LDLCALC, TRIG, CHOLHDL, LDLDIRECT in the last 72 hours. Thyroid Function Tests: No results for input(s): TSH, T4TOTAL, FREET4, T3FREE, THYROIDAB in the last 72 hours. Anemia Panel: No results for input(s): VITAMINB12, FOLATE, FERRITIN, TIBC, IRON, RETICCTPCT in the last 72 hours. Sepsis Labs:  Recent Labs Lab 11/04/15 1552 11/04/15 1752 11/04/15 2121  PROCALCITON  --   --  0.13  LATICACIDVEN 1.34 0.61 1.7    No results found for this or any previous visit (from the past 240 hour(s)).       Radiology Studies: No results found.      Scheduled Meds: . [START ON 11/06/2015] atorvastatin  80 mg Oral q1800  . ceFEPime (MAXIPIME) IV  2 g Intravenous Q24H  . clopidogrel  75 mg Oral Daily  . docusate sodium  100 mg Oral BID  . enoxaparin (LOVENOX) injection  40 mg Subcutaneous Q24H  . insulin aspart  0-15 Units Subcutaneous TID WC  . metoprolol tartrate  12.5 mg Oral BID  . [START ON 11/06/2015] tamsulosin  0.4 mg Oral QODAY   Continuous Infusions:    LOS: 1 day    Time spent:40 min    Rondell Frick, Geraldo Docker, MD [facility] Pager [phone]  If 7PM-7AM, please contact night-coverage [email or web address] Password [email or web address] 11/05/2015, 9:11 AM

## 2015-11-06 DIAGNOSIS — E118 Type 2 diabetes mellitus with unspecified complications: Secondary | ICD-10-CM | POA: Diagnosis present

## 2015-11-06 DIAGNOSIS — A498 Other bacterial infections of unspecified site: Secondary | ICD-10-CM | POA: Diagnosis present

## 2015-11-06 DIAGNOSIS — R350 Frequency of micturition: Secondary | ICD-10-CM

## 2015-11-06 LAB — URINE CULTURE: Culture: >=100000 — AB

## 2015-11-06 LAB — CBC WITH DIFFERENTIAL/PLATELET
Basophils Absolute: 0 10*3/uL (ref 0.0–0.1)
Basophils Relative: 0 %
Eosinophils Absolute: 0.1 10*3/uL (ref 0.0–0.7)
Eosinophils Relative: 1 %
HCT: 32 % — ABNORMAL LOW (ref 39.0–52.0)
Hemoglobin: 10.3 g/dL — ABNORMAL LOW (ref 13.0–17.0)
Lymphocytes Relative: 13 %
Lymphs Abs: 1.8 10*3/uL (ref 0.7–4.0)
MCH: 29.6 pg (ref 26.0–34.0)
MCHC: 32.2 g/dL (ref 30.0–36.0)
MCV: 92 fL (ref 78.0–100.0)
Monocytes Absolute: 1.3 10*3/uL — ABNORMAL HIGH (ref 0.1–1.0)
Monocytes Relative: 9 %
Neutro Abs: 11 10*3/uL — ABNORMAL HIGH (ref 1.7–7.7)
Neutrophils Relative %: 77 %
Platelets: 279 10*3/uL (ref 150–400)
RBC: 3.48 MIL/uL — ABNORMAL LOW (ref 4.22–5.81)
RDW: 16.2 % — ABNORMAL HIGH (ref 11.5–15.5)
WBC: 14.2 10*3/uL — ABNORMAL HIGH (ref 4.0–10.5)

## 2015-11-06 LAB — COMPREHENSIVE METABOLIC PANEL
ALT: 12 U/L — ABNORMAL LOW (ref 17–63)
AST: 25 U/L (ref 15–41)
Albumin: 2.8 g/dL — ABNORMAL LOW (ref 3.5–5.0)
Alkaline Phosphatase: 55 U/L (ref 38–126)
Anion gap: 7 (ref 5–15)
BUN: 13 mg/dL (ref 6–20)
CO2: 21 mmol/L — ABNORMAL LOW (ref 22–32)
Calcium: 8.7 mg/dL — ABNORMAL LOW (ref 8.9–10.3)
Chloride: 104 mmol/L (ref 101–111)
Creatinine, Ser: 1.17 mg/dL (ref 0.61–1.24)
GFR calc Af Amer: >60 mL/min (ref >60)
GFR calc non Af Amer: 55 mL/min — ABNORMAL LOW (ref >60)
Glucose, Bld: 93 mg/dL (ref 65–99)
Potassium: 3.8 mmol/L (ref 3.5–5.1)
Sodium: 132 mmol/L — ABNORMAL LOW (ref 135–145)
Total Bilirubin: 0.6 mg/dL (ref 0.3–1.2)
Total Protein: 5.7 g/dL — ABNORMAL LOW (ref 6.5–8.1)

## 2015-11-06 LAB — MAGNESIUM: Magnesium: 1.7 mg/dL (ref 1.7–2.4)

## 2015-11-06 LAB — GLUCOSE, CAPILLARY
Glucose-Capillary: 96 mg/dL (ref 65–99)
Glucose-Capillary: 111 mg/dL — ABNORMAL HIGH (ref 65–99)
Glucose-Capillary: 123 mg/dL — ABNORMAL HIGH (ref 65–99)
Glucose-Capillary: 104 mg/dL — ABNORMAL HIGH (ref 65–99)

## 2015-11-06 MED ORDER — CEFUROXIME AXETIL 500 MG PO TABS
250.0000 mg | ORAL_TABLET | Freq: Two times a day (BID) | ORAL | Status: DC
  Administered 2015-11-06 – 2015-11-07 (×2): 250 mg via ORAL
  Filled 2015-11-06 (×2): qty 1

## 2015-11-06 NOTE — Progress Notes (Signed)
PROGRESS NOTE    Herbert Marquez  [id number] DOB: 1927-06-04 DOA: 11/04/2015 PCP: Thressa Sheller, MD     Brief Narrative:  82 y.o. WM PMHx TIA, CVA in April, still undergoing home therapy,Basal cell carcinoma multiple areas of body, HTN, HLD, DM type II controlled with complications.  Ever since, he has had urinary frequency.  Last night was worse and this AM, it was even worse.  Also "freezing to death."  Shivering.  Fever - subjective.  +hesitancy and thinks he finishes when he hasn't.  Did have change to meds recently and it didn't help.    CVA April (hospitalized 4/13-18; inpatient rehab through 5/5, outpatient since) - now able to walk with a cane through the house.  Ongoing hemiparesis on the right - leg > arm.  Able to perform own ADLs.    Recently finished 30 day Holter and it went well by report (7/19 - negative for afib).  Subjective: 9/3 A/O 4, NAD. Afebrile overnight.     Assessment & Plan:   Principal Problem:   Sepsis, unspecified organism ([place]) Active Problems:   UTI (lower urinary tract infection)   HLD (hyperlipidemia)   Benign essential HTN   Type 2 diabetes mellitus with complication, without long-term current use of insulin (HCC)   CAD in native artery   H/O: CVA (cerebrovascular accident)   Overactive bladder   Sepsis ([place])   Citrobacter infection   Controlled diabetes mellitus type 2 with complications ([place])   Urinary frequency   Sepsis unspecified organism/ UTI positive CITROBACTER KOSERI -Temp to 101.6, HR to 99 (despite Lopressor), Elevated WBC count, with normal lactate and borderline hypotension (as low as 100/50) -Blood and urine cultures pending -Renal ultrasound 4/17 showed right renal cyst C stable -Unable to see findings from [facility]. -9/1 PSA= 8: In addition patient states when his prostate was/checked was told just mild enlargement. -Change antibiotic to Ceftin if patient tolerates and leukocytosis normalizes discharge on  9/4  Overactive bladder/Urine Frequency -Will continue Flomax 0.4 mg daily to ensure complete bladder emptying. -If no improvement with UTI treatment may start Ditropan 5 mg daily  HLD -Continue Lipitor 80 mg daily  CAD/s/p CVA -Continue Plavix 75 mg daily -Has right-sided hemiparesis, continue fall precautions  DM type II controlled with complications -0000000 Hemoglobin A1c = 5.6  -Hold Actos -Moderate SSI   HTN -Continue Lopressor 12.5 mg BID   DVT prophylaxis: Lovenox Code Status: Full Family Communication: Wife at bedside Disposition Plan: If patient responds quickly, he may be appropriate for discharge in the next 24-48 hours    Consultants:  None  Procedures/Significant Events:  4/17 renal ultrasound:. -Bilateral renal cortical thinning.-No hydronephrosis. -18 mm lower pole right renal cyst   Cultures 9/1 urine positive CITROBACTER KOSERI 9/1 blood left hand/right forearm NGTD   Antimicrobials: Cefepime 9/1>> 9/3 Ceftriaxone 9/11 dose Ceftin 9/3>>  Devices    LINES / TUBES:      Continuous Infusions:    Objective: Vitals:   11/05/15 1433 11/05/15 2014 11/06/15 0427 11/06/15 1350  BP: (!) 100/50 120/60 (!) 106/50 (!) 104/59  Pulse:  69 64 66  Resp:  19 19   Temp:  99 F (37.2 C) 98.6 F (37 C) 98 F (36.7 C)  TempSrc:  Oral Oral Oral  SpO2:  98% 97% 95%  Weight:      Height:        Intake/Output Summary (Last 24 hours) at 11/06/15 1856 Last data filed at 11/06/15 1827  Gross per  24 hour  Intake              650 ml  Output              150 ml  Net              500 ml   Filed Weights   11/04/15 1455 11/04/15 2016  Weight: 80.7 kg (178 lb) 78.4 kg (172 lb 13.5 oz)    Examination:  General: A/O 4, NAD, No acute respiratory distress Eyes: negative scleral hemorrhage, negative anisocoria, negative icterus ENT: Negative Runny nose, negative gingival bleeding, Neck:  Negative scars, masses, torticollis, lymphadenopathy,  JVD Lungs: Clear to auscultation bilaterally without wheezes or crackles Cardiovascular: Regular rate and rhythm without murmur gallop or rub normal S1 and S2 Abdomen: negative abdominal pain, nondistended, positive soft, bowel sounds, no rebound, no ascites, no appreciable mass, negative CVA tenderness Extremities: No significant cyanosis, clubbing, or edema bilateral lower extremities Skin: Negative rashes, lesions, ulcers Psychiatric:  Negative depression, negative anxiety, negative fatigue, negative mania  Central nervous system:  Cranial nerves II through XII intact, tongue/uvula midline, all extremities muscle strength 5/5, sensation intact throughout,  negative dysarthria, negative expressive aphasia, negative receptive aphasia.  .     Data Reviewed: Care during the described time interval was provided by me .  I have reviewed this patient's available data, including medical history, events of note, physical examination, and all test results as part of my evaluation. I have personally reviewed and interpreted all radiology studies.  CBC:  Recent Labs Lab 11/04/15 1545 11/05/15 0345 11/06/15 0155  WBC 15.1* 18.5* 14.2*  NEUTROABS 13.1*  --  11.0*  HGB 11.8* 10.3* 10.3*  HCT 36.2* 32.3* 32.0*  MCV 90.5 91.5 92.0  PLT 299 295 [id number]   Basic Metabolic Panel:  Recent Labs Lab 11/04/15 1545 11/05/15 0345 11/06/15 0155  NA 130* 132* 132*  K 3.8 3.4* 3.8  CL 100* 103 104  CO2 19* 21* 21*  GLUCOSE 105* 102* 93  BUN 12 11 13   CREATININE 1.21 1.12 1.17  CALCIUM 9.2 8.6* 8.7*  MG  --   --  1.7   GFR: Estimated Creatinine Clearance: 42.4 mL/min (by C-G formula based on SCr of 1.17 mg/dL). Liver Function Tests:  Recent Labs Lab 11/04/15 1545 11/06/15 0155  AST 34 25  ALT 15* 12*  ALKPHOS 61 55  BILITOT 0.8 0.6  PROT 5.9* 5.7*  ALBUMIN 3.5 2.8*   No results for input(s): LIPASE, AMYLASE in the last 168 hours. No results for input(s): AMMONIA in the last 168  hours. Coagulation Profile:  Recent Labs Lab 11/04/15 2121  INR 1.27   Cardiac Enzymes: No results for input(s): CKTOTAL, CKMB, CKMBINDEX, TROPONINI in the last 168 hours. BNP (last 3 results) No results for input(s): PROBNP in the last 8760 hours. HbA1C: No results for input(s): HGBA1C in the last 72 hours. CBG:  Recent Labs Lab 11/05/15 1700 11/05/15 2139 11/06/15 0755 11/06/15 1138 11/06/15 1659  GLUCAP 121* 111* 96 111* 123*   Lipid Profile: No results for input(s): CHOL, HDL, LDLCALC, TRIG, CHOLHDL, LDLDIRECT in the last 72 hours. Thyroid Function Tests: No results for input(s): TSH, T4TOTAL, FREET4, T3FREE, THYROIDAB in the last 72 hours. Anemia Panel: No results for input(s): VITAMINB12, FOLATE, FERRITIN, TIBC, IRON, RETICCTPCT in the last 72 hours. Sepsis Labs:  Recent Labs Lab 11/04/15 1552 11/04/15 1752 11/04/15 2121  PROCALCITON  --   --  0.13  LATICACIDVEN 1.34 0.61 1.7  Recent Results (from the past 240 hour(s))  Urine culture     Status: Abnormal   Collection Time: 11/04/15  2:59 PM  Result Value Ref Range Status   Specimen Description URINE, RANDOM  Final   Special Requests NONE  Final   Culture >=100,000 COLONIES/mL CITROBACTER KOSERI (A)  Final   Report Status 11/06/2015 FINAL  Final   Organism ID, Bacteria CITROBACTER KOSERI (A)  Final      Susceptibility   Citrobacter koseri - MIC*    CEFAZOLIN <=4 SENSITIVE Sensitive     CEFTRIAXONE <=1 SENSITIVE Sensitive     CIPROFLOXACIN <=0.25 SENSITIVE Sensitive     GENTAMICIN <=1 SENSITIVE Sensitive     IMIPENEM <=0.25 SENSITIVE Sensitive     NITROFURANTOIN <=16 SENSITIVE Sensitive     TRIMETH/SULFA <=20 SENSITIVE Sensitive     PIP/TAZO <=4 SENSITIVE Sensitive     * >=100,000 COLONIES/mL CITROBACTER KOSERI  Blood Culture (routine x 2)     Status: None (Preliminary result)   Collection Time: 11/04/15  3:30 PM  Result Value Ref Range Status   Specimen Description BLOOD LEFT HAND  Final    Special Requests BOTTLES DRAWN AEROBIC AND ANAEROBIC 5CC  Final   Culture NO GROWTH 2 DAYS  Final   Report Status PENDING  Incomplete  Blood Culture (routine x 2)     Status: None (Preliminary result)   Collection Time: 11/04/15  3:34 PM  Result Value Ref Range Status   Specimen Description BLOOD RIGHT FOREARM  Final   Special Requests BOTTLES DRAWN AEROBIC AND ANAEROBIC 5CC  Final   Culture NO GROWTH 2 DAYS  Final   Report Status PENDING  Incomplete         Radiology Studies: No results found.      Scheduled Meds: . atorvastatin  80 mg Oral q1800  . cefUROXime  250 mg Oral BID WC  . clopidogrel  75 mg Oral Daily  . docusate sodium  100 mg Oral BID  . enoxaparin (LOVENOX) injection  40 mg Subcutaneous Q24H  . insulin aspart  0-15 Units Subcutaneous TID WC  . metoprolol tartrate  12.5 mg Oral BID  . tamsulosin  0.4 mg Oral QODAY   Continuous Infusions:    LOS: 2 days    Time spent:40 min    , Geraldo Docker, MD [facility] Pager [phone]  If 7PM-7AM, please contact night-coverage [email or web address] Password [email or web address] 11/06/2015, 6:56 PM

## 2015-11-07 DIAGNOSIS — A419 Sepsis, unspecified organism: Principal | ICD-10-CM

## 2015-11-07 DIAGNOSIS — I10 Essential (primary) hypertension: Secondary | ICD-10-CM

## 2015-11-07 DIAGNOSIS — I2510 Atherosclerotic heart disease of native coronary artery without angina pectoris: Secondary | ICD-10-CM

## 2015-11-07 DIAGNOSIS — A498 Other bacterial infections of unspecified site: Secondary | ICD-10-CM

## 2015-11-07 LAB — CBC WITH DIFFERENTIAL/PLATELET
Basophils Absolute: 0 10*3/uL (ref 0.0–0.1)
Basophils Relative: 0 %
Eosinophils Absolute: 0.1 10*3/uL (ref 0.0–0.7)
Eosinophils Relative: 1 %
HCT: 35.7 % — ABNORMAL LOW (ref 39.0–52.0)
Hemoglobin: 11.3 g/dL — ABNORMAL LOW (ref 13.0–17.0)
Lymphocytes Relative: 17 %
Lymphs Abs: 2 10*3/uL (ref 0.7–4.0)
MCH: 29 pg (ref 26.0–34.0)
MCHC: 31.7 g/dL (ref 30.0–36.0)
MCV: 91.8 fL (ref 78.0–100.0)
Monocytes Absolute: 1.4 10*3/uL — ABNORMAL HIGH (ref 0.1–1.0)
Monocytes Relative: 12 %
Neutro Abs: 8 10*3/uL — ABNORMAL HIGH (ref 1.7–7.7)
Neutrophils Relative %: 70 %
Platelets: 321 10*3/uL (ref 150–400)
RBC: 3.89 MIL/uL — ABNORMAL LOW (ref 4.22–5.81)
RDW: 15.9 % — ABNORMAL HIGH (ref 11.5–15.5)
WBC: 11.6 10*3/uL — ABNORMAL HIGH (ref 4.0–10.5)

## 2015-11-07 LAB — COMPREHENSIVE METABOLIC PANEL
ALT: 15 U/L — ABNORMAL LOW (ref 17–63)
AST: 28 U/L (ref 15–41)
Albumin: 2.9 g/dL — ABNORMAL LOW (ref 3.5–5.0)
Alkaline Phosphatase: 70 U/L (ref 38–126)
Anion gap: 7 (ref 5–15)
BUN: 13 mg/dL (ref 6–20)
CO2: 23 mmol/L (ref 22–32)
Calcium: 8.6 mg/dL — ABNORMAL LOW (ref 8.9–10.3)
Chloride: 101 mmol/L (ref 101–111)
Creatinine, Ser: 1.11 mg/dL (ref 0.61–1.24)
GFR calc Af Amer: >60 mL/min (ref >60)
GFR calc non Af Amer: 58 mL/min — ABNORMAL LOW (ref >60)
Glucose, Bld: 104 mg/dL — ABNORMAL HIGH (ref 65–99)
Potassium: 3.9 mmol/L (ref 3.5–5.1)
Sodium: 131 mmol/L — ABNORMAL LOW (ref 135–145)
Total Bilirubin: 0.7 mg/dL (ref 0.3–1.2)
Total Protein: 5.5 g/dL — ABNORMAL LOW (ref 6.5–8.1)

## 2015-11-07 LAB — GLUCOSE, CAPILLARY: Glucose-Capillary: 107 mg/dL — ABNORMAL HIGH (ref 65–99)

## 2015-11-07 LAB — MAGNESIUM: Magnesium: 1.8 mg/dL (ref 1.7–2.4)

## 2015-11-07 MED ORDER — CEFUROXIME AXETIL 500 MG PO TABS: 500.0000 mg | ORAL_TABLET | Freq: Two times a day (BID) | ORAL | 0 refills | Status: DC

## 2015-11-07 MED ORDER — CEFUROXIME AXETIL 500 MG PO TABS: 500.0000 mg | ORAL_TABLET | Freq: Two times a day (BID) | ORAL | Status: DC

## 2015-11-07 NOTE — Progress Notes (Signed)
Patient discharged to home with instructions. 

## 2015-11-07 NOTE — Discharge Summary (Signed)
Physician Discharge Summary   Patient ID: Herbert Marquez MRN: [id number] DOB/AGE: 1927/12/01 82 y.o.  Admit date: 11/04/2015 Discharge date: 11/07/2015  Primary Care Physician:  Thressa Sheller, MD  Discharge Diagnoses:   . UTI (lower urinary tract infection) . Sepsis, unspecified organism ([place]) . Benign essential HTN .  CAD (coronary artery disease) . CAD in native artery . HLD (hyperlipidemia) . Type 2 diabetes mellitus with complication, without long-term current use of insulin ([place]) . Overactive bladder . Controlled diabetes mellitus type 2 with complications ([place])    Consults: none   Recommendations for Outpatient Follow-up:  1. Home health PT OT, RN arranged by case management 2. Please repeat CBC/BMET at next visit   DIET: Heart healthy diet    Allergies:  No Known Allergies   DISCHARGE MEDICATIONS: Discharge Medication List as of 11/07/2015 10:59 AM    START taking these medications   Details  cefUROXime (CEFTIN) 500 MG tablet Take 1 tablet (500 mg total) by mouth 2 (two) times daily with a meal. X 7days, Starting Mon 11/07/2015, Print      CONTINUE these medications which have NOT CHANGED   Details  atorvastatin (LIPITOR) 80 MG tablet Take 1 tablet (80 mg total) by mouth daily., Starting Fri 07/08/2015, Print    clopidogrel (PLAVIX) 75 MG tablet Take 1 tablet (75 mg total) by mouth daily., Starting Fri 07/08/2015, Print    metoprolol tartrate (LOPRESSOR) 25 MG tablet Take 0.5 tablets (12.5 mg total) by mouth 2 (two) times daily., Starting Fri 07/08/2015, Print    Multiple Vitamin (MULTIVITAMIN) tablet Take 1 tablet by mouth daily. , Until Discontinued, Historical Med    nitroGLYCERIN (NITROSTAT) 0.4 MG SL tablet Place 0.4 mg under the tongue every 5 (five) minutes as needed for chest pain., Historical Med    pioglitazone (ACTOS) 30 MG tablet Take 0.5 tablets (15 mg total) by mouth daily., Starting Fri 07/08/2015, Print    tamsulosin (FLOMAX) 0.4 MG CAPS capsule  Take 0.4 mg by mouth every other day. , Historical Med         Brief H and P: For complete details please refer to admission H and P, but in brief 82 y.o.WM PMHx TIA, CVA in April, still undergoing home therapy,Basal cell carcinoma multiple areas of body, HTN, HLD, DM type II controlled with complications. Ever since, he has hadurinary frequency. Last night was worse and this AM, it was even worse. Also "freezing to death." Shivering. Fever - subjective. +hesitancy and thinks he finishes when he hasn't. Did have change to meds recently and it didn't help.  CVA April (hospitalized 4/13-18; inpatient rehab through 5/5, outpatient since)- now able to walk with a cane through the house. Ongoing hemiparesis on the right - leg >arm. Able to perform own ADLs. Recently finished 30 day Holter and it went well by report (7/19 - negative for afib).   Hospital Course:   Sepsis unspecified organism/ UTI CITROBACTER KOSERI -On admission, met sepsis criteria. Temp to 101.6, HR to 99 (despite Lopressor), Elevated WBC count, with normal lactate and borderline hypotension (as low as 100/50) -Blood cultures negative to date. Urine culture showed Citrobacter.  -Renal ultrasound 4/17 showed right renal cyst stable -Unable to see findings from [facility]. -9/1 PSA= 8: In addition patient states when his prostate was/checked was told just mild enlargement. -Patient was placed on IV antibiotics and transitioned to oral cefuroxime for 7 days to complete full course.  Overactive bladder/Urine Frequency -Will continue Flomax 0.4 mg daily to ensure  complete bladder emptying. -If no improvement with UTI treatment may start Ditropan 5 mg daily  HLD -Continue Lipitor 80 mg daily  CAD/s/p CVA -Continue Plavix 75 mg daily -Has right-sided hemiparesis, continue fall precautions  DM type II controlled with complications Patient was placed on sliding scale insulin while  inpatient  HTN -Continue Lopressor 12.5 mg BID  Day of Discharge BP 111/64 (BP Location: Left Arm)   Pulse 85   Temp 98.3 F (36.8 C) (Oral)   Resp 18   Ht 5' 7"  (1.702 m)   Wt 78.4 kg (172 lb 13.5 oz)   SpO2 100%   BMI 27.07 kg/m   Physical Exam: General: Alert and awake oriented x3 not in any acute distress. HEENT: anicteric sclera, pupils reactive to light and accommodation CVS: S1-S2 clear no murmur rubs or gallops Chest: clear to auscultation bilaterally, no wheezing rales or rhonchi Abdomen: soft nontender, nondistended, normal bowel sounds Extremities: no cyanosis, clubbing or edema noted bilaterally    The results of significant diagnostics from this hospitalization (including imaging, microbiology, ancillary and laboratory) are listed below for reference.    LAB RESULTS: Basic Metabolic Panel:  Recent Labs Lab 11/06/15 0155 11/07/15 0340  NA 132* 131*  K 3.8 3.9  CL 104 101  CO2 21* 23  GLUCOSE 93 104*  BUN 13 13  CREATININE 1.17 1.11  CALCIUM 8.7* 8.6*  MG 1.7 1.8   Liver Function Tests:  Recent Labs Lab 11/06/15 0155 11/07/15 0340  AST 25 28  ALT 12* 15*  ALKPHOS 55 70  BILITOT 0.6 0.7  PROT 5.7* 5.5*  ALBUMIN 2.8* 2.9*   No results for input(s): LIPASE, AMYLASE in the last 168 hours. No results for input(s): AMMONIA in the last 168 hours. CBC:  Recent Labs Lab 11/06/15 0155 11/07/15 0340  WBC 14.2* 11.6*  NEUTROABS 11.0* 8.0*  HGB 10.3* 11.3*  HCT 32.0* 35.7*  MCV 92.0 91.8  PLT 279 321   Cardiac Enzymes: No results for input(s): CKTOTAL, CKMB, CKMBINDEX, TROPONINI in the last 168 hours. BNP: Invalid input(s): POCBNP CBG:  Recent Labs Lab 11/06/15 2201 11/07/15 0742  GLUCAP 104* 107*    Significant Diagnostic Studies:  No results found.  2D ECHO:   Disposition and Follow-up: Discharge Instructions    Diet Carb Modified    Complete by:  As directed   Increase activity slowly    Complete by:  As directed        DISPOSITION:Home   DISCHARGE FOLLOW-UP Follow-up Information    MACKENZIE,BRIAN, MD. Schedule an appointment as soon as possible for a visit in 2 week(s).   Specialty:  Internal Medicine Contact information: [address], [place] [address] [phone]            Time spent on Discharge: 25 minutes  Signed:   Amry Cathy M.D. [facility] 11/07/2015, 12:54 PM Pager: [phone]

## 2015-11-07 NOTE — Care Management Note (Signed)
Case Management Note  Patient Details  Name: Herbert Marquez MRN: [id number] Date of Birth: June 11, 1927  Subjective/Objective:                    Action/Plan:  Patient already has cane , walker , wheel chair and transport chair  Expected Discharge Date:                  Expected Discharge Plan:  [place]  In-House Referral:     Discharge planning Services  CM Consult  Post Acute Care Choice:  Home Health Choice offered to:  Patient, Spouse  DME Arranged:    DME Agency:     HH Arranged:  PT, OT HH Agency:  [facility]  Status of Service:  Completed, signed off  If discussed at [place] of Stay Meetings, dates discussed:    Additional Comments:  Marilu Favre, RN 11/07/2015, 9:32 AM

## 2015-11-07 NOTE — Care Management Important Message (Signed)
Important Message  Patient Details  Name: Herbert Marquez MRN: [id number] Date of Birth: June 30, 1927   Medicare Important Message Given:  Yes    Kymir Coles 11/07/2015, 9:40 AM

## 2015-11-07 NOTE — Evaluation (Signed)
Physical Therapy Evaluation Patient Details Name: Herbert Marquez MRN: [id number] DOB: [birth year] Today's Date: 11/07/2015   History of Present Illness  Pt is an 82 y.o. male admitted with sepsis and positive UTI. PMH: CVA, DM, HTN, CAD, Rt knee fx.   Clinical Impression  Pt and spouse report feeling confident with the patient's current mobility level and ability to D/C to home. Pt able to ambulate 25 ft with rw and min guard assistance and perform sit/stand transfers with min guard assist. Following session, pt and spouse deny any questions or concerns. Recommending HHPT services following acute stay. Will continue to follow to progress as tolerated.     Follow Up Recommendations Home health PT;Supervision for mobility/OOB    Equipment Recommendations  None recommended by PT    Recommendations for Other Services       Precautions / Restrictions Precautions Precautions: Fall Restrictions Weight Bearing Restrictions: No      Mobility  Bed Mobility Overal bed mobility: Modified Independent             General bed mobility comments: using rail to sit EOB  Transfers Overall transfer level: Needs assistance Equipment used: Rolling walker (2 wheeled) Transfers: Sit to/from Stand Sit to Stand: Min guard         General transfer comment: repeat x2, from bed and chair. Reminder to fully turn and reach for chair before sitting.   Ambulation/Gait Ambulation/Gait assistance: Min guard Ambulation Distance (Feet): 25 Feet Assistive device: Rolling walker (2 wheeled) Gait Pattern/deviations: Step-through pattern;Decreased step length - right;Decreased step length - left     General Gait Details: mild instability during ambulation but no gross loss of balance. Pt did fatigue quickly and required a seated break.   Stairs            Wheelchair Mobility    Modified Rankin (Stroke Patients Only)       Balance Overall balance assessment: Needs  assistance Sitting-balance support: No upper extremity supported Sitting balance-Leahy Scale: Good     Standing balance support: Bilateral upper extremity supported Standing balance-Leahy Scale: Poor Standing balance comment: using rw for support                             Pertinent Vitals/Pain Pain Assessment: Faces Faces Pain Scale: Hurts little more Pain Location: bilateral knees (when standing) Pain Descriptors / Indicators:  (hurts) Pain Intervention(s): Limited activity within patient's tolerance;Monitored during session    Home Living Family/patient expects to be discharged to:: Private residence Living Arrangements: Spouse/significant other Available Help at Discharge: Family;Available 24 hours/day Type of Home: House Home Access: Ramped entrance     Home Layout: One level Home Equipment: Walker - 2 wheels;Cane - single point;Wheelchair - Banker      Prior Function Level of Independence: Independent with assistive device(s)         Comments: Primarily using w/c for mobility around the house, using rw or SPC for ambulation with therapy and short distances in the home.      Hand Dominance        Extremity/Trunk Assessment   Upper Extremity Assessment: RUE deficits/detail RUE Deficits / Details: decreased Rt UE strength.            RLE Deficits / Details: decreased Rt LE strength, able to move independently for bed mobility and during ambulation.        Communication   Communication: HOH  Cognition Arousal/Alertness: Awake/alert Behavior During  Therapy: WFL for tasks assessed/performed Overall Cognitive Status: Within Functional Limits for tasks assessed                      General Comments      Exercises        Assessment/Plan    PT Assessment Patient needs continued PT services  PT Diagnosis Difficulty walking   PT Problem List Decreased strength;Decreased activity tolerance;Decreased  balance;Decreased mobility  PT Treatment Interventions DME instruction;Gait training;Stair training;Functional mobility training;Therapeutic activities;Therapeutic exercise;Patient/family education   PT Goals (Current goals can be found in the Care Plan section) Acute Rehab PT Goals Patient Stated Goal: go home PT Goal Formulation: With patient Time For Goal Achievement: 11/14/15 Potential to Achieve Goals: Good    Frequency Min 3X/week   Barriers to discharge        Co-evaluation               End of Session Equipment Utilized During Treatment: Gait belt Activity Tolerance: Patient tolerated treatment well;Patient limited by fatigue Patient left: in chair;with call bell/phone within reach;with family/visitor present Nurse Communication: Mobility status    Functional Assessment Tool Used: clinical judgment Functional Limitation: Mobility: Walking and moving around Mobility: Walking and Moving Around Current Status [phone]): At least 20 percent but less than 40 percent impaired, limited or restricted Mobility: Walking and Moving Around Goal Status [phone]): At least 1 percent but less than 20 percent impaired, limited or restricted    Time: 0915-0943 PT Time Calculation (min) (ACUTE ONLY): 28 min   Charges:   PT Evaluation $PT Eval Moderate Complexity: 1 Procedure PT Treatments $Therapeutic Activity: 8-22 mins   PT G Codes:   PT G-Codes **NOT FOR INPATIENT CLASS** Functional Assessment Tool Used: clinical judgment Functional Limitation: Mobility: Walking and moving around Mobility: Walking and Moving Around Current Status [id number]): At least 20 percent but less than 40 percent impaired, limited or restricted Mobility: Walking and Moving Around Goal Status [phone]): At least 1 percent but less than 20 percent impaired, limited or restricted    Cassell Clement, PT, [place] Pager [phone] Office [phone]  11/07/2015, 9:55 AM

## 2015-11-08 DIAGNOSIS — I69351 Hemiplegia and hemiparesis following cerebral infarction affecting right dominant side: Secondary | ICD-10-CM | POA: Diagnosis not present

## 2015-11-08 DIAGNOSIS — Z87891 Personal history of nicotine dependence: Secondary | ICD-10-CM | POA: Diagnosis not present

## 2015-11-08 DIAGNOSIS — K219 Gastro-esophageal reflux disease without esophagitis: Secondary | ICD-10-CM | POA: Diagnosis not present

## 2015-11-08 DIAGNOSIS — E785 Hyperlipidemia, unspecified: Secondary | ICD-10-CM | POA: Diagnosis not present

## 2015-11-08 DIAGNOSIS — I10 Essential (primary) hypertension: Secondary | ICD-10-CM | POA: Diagnosis not present

## 2015-11-08 DIAGNOSIS — N390 Urinary tract infection, site not specified: Secondary | ICD-10-CM | POA: Diagnosis not present

## 2015-11-08 DIAGNOSIS — I2510 Atherosclerotic heart disease of native coronary artery without angina pectoris: Secondary | ICD-10-CM | POA: Diagnosis not present

## 2015-11-08 DIAGNOSIS — M150 Primary generalized (osteo)arthritis: Secondary | ICD-10-CM | POA: Diagnosis not present

## 2015-11-08 DIAGNOSIS — E119 Type 2 diabetes mellitus without complications: Secondary | ICD-10-CM | POA: Diagnosis not present

## 2015-11-08 DIAGNOSIS — Z7984 Long term (current) use of oral hypoglycemic drugs: Secondary | ICD-10-CM | POA: Diagnosis not present

## 2015-11-09 LAB — CULTURE, BLOOD (ROUTINE X 2)
Culture: NO GROWTH
Culture: NO GROWTH

## 2015-11-14 DIAGNOSIS — E119 Type 2 diabetes mellitus without complications: Secondary | ICD-10-CM | POA: Diagnosis not present

## 2015-11-14 DIAGNOSIS — M150 Primary generalized (osteo)arthritis: Secondary | ICD-10-CM | POA: Diagnosis not present

## 2015-11-14 DIAGNOSIS — I2510 Atherosclerotic heart disease of native coronary artery without angina pectoris: Secondary | ICD-10-CM | POA: Diagnosis not present

## 2015-11-14 DIAGNOSIS — I10 Essential (primary) hypertension: Secondary | ICD-10-CM | POA: Diagnosis not present

## 2015-11-14 DIAGNOSIS — N390 Urinary tract infection, site not specified: Secondary | ICD-10-CM | POA: Diagnosis not present

## 2015-11-14 DIAGNOSIS — I69351 Hemiplegia and hemiparesis following cerebral infarction affecting right dominant side: Secondary | ICD-10-CM | POA: Diagnosis not present

## 2015-11-15 DIAGNOSIS — N390 Urinary tract infection, site not specified: Secondary | ICD-10-CM | POA: Diagnosis not present

## 2015-11-15 DIAGNOSIS — E119 Type 2 diabetes mellitus without complications: Secondary | ICD-10-CM | POA: Diagnosis not present

## 2015-11-15 DIAGNOSIS — M150 Primary generalized (osteo)arthritis: Secondary | ICD-10-CM | POA: Diagnosis not present

## 2015-11-15 DIAGNOSIS — I10 Essential (primary) hypertension: Secondary | ICD-10-CM | POA: Diagnosis not present

## 2015-11-15 DIAGNOSIS — I2510 Atherosclerotic heart disease of native coronary artery without angina pectoris: Secondary | ICD-10-CM | POA: Diagnosis not present

## 2015-11-15 DIAGNOSIS — I69351 Hemiplegia and hemiparesis following cerebral infarction affecting right dominant side: Secondary | ICD-10-CM | POA: Diagnosis not present

## 2015-11-16 DIAGNOSIS — N12 Tubulo-interstitial nephritis, not specified as acute or chronic: Secondary | ICD-10-CM | POA: Diagnosis not present

## 2015-11-16 DIAGNOSIS — Z23 Encounter for immunization: Secondary | ICD-10-CM | POA: Diagnosis not present

## 2015-11-16 DIAGNOSIS — H04123 Dry eye syndrome of bilateral lacrimal glands: Secondary | ICD-10-CM | POA: Diagnosis not present

## 2015-11-16 DIAGNOSIS — I129 Hypertensive chronic kidney disease with stage 1 through stage 4 chronic kidney disease, or unspecified chronic kidney disease: Secondary | ICD-10-CM | POA: Diagnosis not present

## 2015-11-16 DIAGNOSIS — H01001 Unspecified blepharitis right upper eyelid: Secondary | ICD-10-CM | POA: Diagnosis not present

## 2015-11-16 DIAGNOSIS — A419 Sepsis, unspecified organism: Secondary | ICD-10-CM | POA: Diagnosis not present

## 2015-11-16 DIAGNOSIS — R2681 Unsteadiness on feet: Secondary | ICD-10-CM | POA: Diagnosis not present

## 2015-11-16 DIAGNOSIS — E119 Type 2 diabetes mellitus without complications: Secondary | ICD-10-CM | POA: Diagnosis not present

## 2015-11-16 DIAGNOSIS — H52203 Unspecified astigmatism, bilateral: Secondary | ICD-10-CM | POA: Diagnosis not present

## 2015-11-17 DIAGNOSIS — I69321 Dysphasia following cerebral infarction: Secondary | ICD-10-CM | POA: Diagnosis not present

## 2015-11-17 DIAGNOSIS — E1142 Type 2 diabetes mellitus with diabetic polyneuropathy: Secondary | ICD-10-CM | POA: Diagnosis not present

## 2015-11-17 DIAGNOSIS — I2510 Atherosclerotic heart disease of native coronary artery without angina pectoris: Secondary | ICD-10-CM | POA: Diagnosis not present

## 2015-11-17 DIAGNOSIS — I69351 Hemiplegia and hemiparesis following cerebral infarction affecting right dominant side: Secondary | ICD-10-CM | POA: Diagnosis not present

## 2015-11-18 ENCOUNTER — Ambulatory Visit (HOSPITAL_BASED_OUTPATIENT_CLINIC_OR_DEPARTMENT_OTHER): Payer: Medicare Other | Admitting: Physical Medicine & Rehabilitation

## 2015-11-18 ENCOUNTER — Encounter: Payer: Self-pay | Admitting: Physical Medicine & Rehabilitation

## 2015-11-18 ENCOUNTER — Encounter: Payer: Medicare Other | Attending: Physical Medicine & Rehabilitation

## 2015-11-18 VITALS — BP 161/77 | HR 60 | Resp 14

## 2015-11-18 DIAGNOSIS — I10 Essential (primary) hypertension: Secondary | ICD-10-CM | POA: Diagnosis not present

## 2015-11-18 DIAGNOSIS — I69991 Dysphagia following unspecified cerebrovascular disease: Secondary | ICD-10-CM | POA: Diagnosis not present

## 2015-11-18 DIAGNOSIS — I69398 Other sequelae of cerebral infarction: Secondary | ICD-10-CM

## 2015-11-18 DIAGNOSIS — Z85828 Personal history of other malignant neoplasm of skin: Secondary | ICD-10-CM | POA: Diagnosis not present

## 2015-11-18 DIAGNOSIS — I639 Cerebral infarction, unspecified: Secondary | ICD-10-CM

## 2015-11-18 DIAGNOSIS — K219 Gastro-esophageal reflux disease without esophagitis: Secondary | ICD-10-CM | POA: Insufficient documentation

## 2015-11-18 DIAGNOSIS — N400 Enlarged prostate without lower urinary tract symptoms: Secondary | ICD-10-CM | POA: Insufficient documentation

## 2015-11-18 DIAGNOSIS — I2510 Atherosclerotic heart disease of native coronary artery without angina pectoris: Secondary | ICD-10-CM | POA: Diagnosis not present

## 2015-11-18 DIAGNOSIS — I69359 Hemiplegia and hemiparesis following cerebral infarction affecting unspecified side: Secondary | ICD-10-CM

## 2015-11-18 DIAGNOSIS — I69393 Ataxia following cerebral infarction: Secondary | ICD-10-CM

## 2015-11-18 DIAGNOSIS — E119 Type 2 diabetes mellitus without complications: Secondary | ICD-10-CM | POA: Diagnosis not present

## 2015-11-18 DIAGNOSIS — E1142 Type 2 diabetes mellitus with diabetic polyneuropathy: Secondary | ICD-10-CM | POA: Insufficient documentation

## 2015-11-18 DIAGNOSIS — N390 Urinary tract infection, site not specified: Secondary | ICD-10-CM | POA: Diagnosis not present

## 2015-11-18 DIAGNOSIS — I69351 Hemiplegia and hemiparesis following cerebral infarction affecting right dominant side: Secondary | ICD-10-CM | POA: Diagnosis not present

## 2015-11-18 DIAGNOSIS — R269 Unspecified abnormalities of gait and mobility: Secondary | ICD-10-CM

## 2015-11-18 DIAGNOSIS — E785 Hyperlipidemia, unspecified: Secondary | ICD-10-CM | POA: Diagnosis not present

## 2015-11-18 DIAGNOSIS — Z5189 Encounter for other specified aftercare: Secondary | ICD-10-CM | POA: Diagnosis not present

## 2015-11-18 NOTE — Progress Notes (Signed)
Subjective:  82 year old male with history of diabetes and coronary artery disease to developed right-sided weakness and facial droop on 06/16/2015. Further workup revealed a left thalamic infarct, acute. Patient was admitted to the hospital then transferred to the rehabilitation unit on 06/21/2015 and completed his inpatient rehabilitation program on 07/09/2015.  Patient ID: Herbert Marquez, male    DOB: 08/27/1927, 82 y.o.   MRN: [id number]  HPI Still receiving HHPT.  Dressing and bathing Mod I Still uses walker.  Amb distance ~17' Hospitalized for UTI 9/1 RIght knee pain, hx trauma Walking with cane at times with PT PCP is setting up outpt therapy at [facility] Pain Inventory Average Pain 2 Pain Right Now 0 My pain is dull  In the last 24 hours, has pain interfered with the following? General activity 0 Relation with others 0 Enjoyment of life 0 What TIME of day is your pain at its worst? no pain Sleep (in general) Fair  Pain is worse with: walking and standing Pain improves with: therapy/exercise Relief from Meds: no pain  Mobility walk without assistance walk with assistance use a walker ability to climb steps?  yes do you drive?  no needs help with transfers Do you have any goals in this area?  yes  Function retired  Neuro/Psych weakness trouble walking  Prior Studies Any changes since last visit?  no  Physicians involved in your care Any changes since last visit?  no   Family History  Problem Relation Age of Onset  . Hypertension Mother   . Lung cancer Father   . Lung cancer Brother    Social History   Social History  . Marital status: Married    Spouse name: N/A  . Number of children: N/A  . Years of education: N/A   Occupational History  . Retired Other   Social History Main Topics  . Smoking status: Former Smoker    Quit date: 03/06/1971  . Smokeless tobacco: Never Used  . Alcohol use 1.2 oz/week    1 Glasses of wine, 1 Cans of beer per week      Comment: 1/2 BEER AND 1 WINE  . Drug use: No  . Sexual activity: No   Other Topics Concern  . None   Social History Narrative   Lives in [place], [place] with wife. Has 3 children.    Past Surgical History:  Procedure Laterality Date  . CARDIOVASCULAR STRESS TEST  07/01/2007   EF 74%  . CATARACT EXTRACTION, BILATERAL    . CORONARY ANGIOPLASTY WITH STENT PLACEMENT  10/2004   stenting x 2 to RCA  . HERNIA REPAIR    . HIP ARTHROPLASTY  03/09/2011   Procedure: ARTHROPLASTY BIPOLAR HIP;  Surgeon: Mauri Pole;  Location: [facility];  Service: Orthopedics;  Laterality: Left;  . LAPAROSCOPIC INCISIONAL / UMBILICAL / VENTRAL HERNIA REPAIR     "below his naval"   Past Medical History:  Diagnosis Date  . Arthritis    "pretty much all over"   . Basal cell carcinoma    "several burned off his body, face, head"  . BPH (benign prostatic hypertrophy)   . Coronary artery disease    a. s/p PCI of RCA in 2006  . CVA (cerebral infarction)    a. 06/2015: left thalamic and bilateral PCA  . GERD (gastroesophageal reflux disease)   . Hyperlipidemia   . Hypertension   . TIA (transient ischemic attack)    Approximately 6 weeks post-cardiac catheterization.   [name]  Type II diabetes mellitus ([place])    "prediabetic; lost alot of weight; not diabetic now" (06/16/2015)   BP (!) 161/77 (BP Location: Left Arm, Patient Position: Sitting, Cuff Size: Normal)   Pulse 60   Resp 14   SpO2 98%   Opioid Risk Score:   Fall Risk Score:  `1  Depression screen PHQ 2/9  Depression screen [facility] 2/9 08/18/2015 07/21/2015  Decreased Interest 0 0  Down, Depressed, Hopeless 0 1  PHQ - 2 Score 0 1  Altered sleeping - 0  Tired, decreased energy - 2  Change in appetite - 0  Feeling bad or failure about yourself  - 1  Trouble concentrating - 0  Moving slowly or fidgety/restless - 1  Suicidal thoughts - 0  PHQ-9 Score - 5  Difficult doing work/chores - Somewhat difficult    Review of Systems  HENT: Negative.     Respiratory: Negative.   Cardiovascular: Negative.   Gastrointestinal: Negative.   Genitourinary: Negative.   Musculoskeletal: Positive for gait problem.  Allergic/Immunologic: Negative.   Neurological: Positive for weakness.  Hematological: Negative.   Psychiatric/Behavioral: Negative.        Objective:   Physical Exam  Constitutional: He is oriented to person, place, and time. He appears well-developed and well-nourished.  HENT:  Head: Normocephalic and atraumatic.  Eyes: Conjunctivae and EOM are normal. Pupils are equal, round, and reactive to light.  Neck: Normal range of motion.  Musculoskeletal:       Right knee: He exhibits decreased range of motion and swelling. He exhibits no deformity. No tenderness found.  Neurological: He is alert and oriented to person, place, and time. No sensory deficit. Gait abnormal.  4/5 strength in BUE and BLE  Unable to amb without waker, does not have it with him  Psychiatric: He has a normal mood and affect.  Nursing note and vitals reviewed.         Assessment & Plan:  1. Left thalamic infarct, Right hemiataxia improving Will need OP PT after HH completed PCP arranging F/u PCP  No PMR f/u needed  2.  Right knee pain- has had Xrays at Ortho, pt doesn't want injections

## 2015-11-21 DIAGNOSIS — N390 Urinary tract infection, site not specified: Secondary | ICD-10-CM | POA: Diagnosis not present

## 2015-11-21 DIAGNOSIS — I69351 Hemiplegia and hemiparesis following cerebral infarction affecting right dominant side: Secondary | ICD-10-CM | POA: Diagnosis not present

## 2015-11-21 DIAGNOSIS — I2510 Atherosclerotic heart disease of native coronary artery without angina pectoris: Secondary | ICD-10-CM | POA: Diagnosis not present

## 2015-11-21 DIAGNOSIS — M150 Primary generalized (osteo)arthritis: Secondary | ICD-10-CM | POA: Diagnosis not present

## 2015-11-21 DIAGNOSIS — E119 Type 2 diabetes mellitus without complications: Secondary | ICD-10-CM | POA: Diagnosis not present

## 2015-11-21 DIAGNOSIS — I10 Essential (primary) hypertension: Secondary | ICD-10-CM | POA: Diagnosis not present

## 2015-11-24 ENCOUNTER — Inpatient Hospital Stay (HOSPITAL_COMMUNITY)
Admission: EM | Admit: 2015-11-24 | Discharge: 2015-11-30 | DRG: 480 | Disposition: A | Discharge status: Skilled Nursing Facility | Payer: Medicare Other | Attending: Internal Medicine | Admitting: Internal Medicine

## 2015-11-24 ENCOUNTER — Encounter (HOSPITAL_COMMUNITY): Payer: Self-pay

## 2015-11-24 ENCOUNTER — Emergency Department (HOSPITAL_COMMUNITY): Payer: Medicare Other

## 2015-11-24 DIAGNOSIS — I10 Essential (primary) hypertension: Secondary | ICD-10-CM | POA: Diagnosis present

## 2015-11-24 DIAGNOSIS — S7221XA Displaced subtrochanteric fracture of right femur, initial encounter for closed fracture: Secondary | ICD-10-CM | POA: Diagnosis present

## 2015-11-24 DIAGNOSIS — E118 Type 2 diabetes mellitus with unspecified complications: Secondary | ICD-10-CM | POA: Diagnosis present

## 2015-11-24 DIAGNOSIS — T148 Other injury of unspecified body region: Secondary | ICD-10-CM | POA: Diagnosis not present

## 2015-11-24 DIAGNOSIS — I4819 Other persistent atrial fibrillation: Secondary | ICD-10-CM

## 2015-11-24 DIAGNOSIS — E119 Type 2 diabetes mellitus without complications: Secondary | ICD-10-CM | POA: Diagnosis not present

## 2015-11-24 DIAGNOSIS — E43 Unspecified severe protein-calorie malnutrition: Secondary | ICD-10-CM | POA: Diagnosis not present

## 2015-11-24 DIAGNOSIS — S72141A Displaced intertrochanteric fracture of right femur, initial encounter for closed fracture: Secondary | ICD-10-CM | POA: Diagnosis not present

## 2015-11-24 DIAGNOSIS — Z419 Encounter for procedure for purposes other than remedying health state, unspecified: Secondary | ICD-10-CM

## 2015-11-24 DIAGNOSIS — Z09 Encounter for follow-up examination after completed treatment for conditions other than malignant neoplasm: Secondary | ICD-10-CM

## 2015-11-24 DIAGNOSIS — I4891 Unspecified atrial fibrillation: Secondary | ICD-10-CM | POA: Diagnosis not present

## 2015-11-24 DIAGNOSIS — I69351 Hemiplegia and hemiparesis following cerebral infarction affecting right dominant side: Secondary | ICD-10-CM

## 2015-11-24 DIAGNOSIS — Z8249 Family history of ischemic heart disease and other diseases of the circulatory system: Secondary | ICD-10-CM

## 2015-11-24 DIAGNOSIS — N182 Chronic kidney disease, stage 2 (mild): Secondary | ICD-10-CM | POA: Diagnosis present

## 2015-11-24 DIAGNOSIS — E785 Hyperlipidemia, unspecified: Secondary | ICD-10-CM | POA: Diagnosis present

## 2015-11-24 DIAGNOSIS — Z7902 Long term (current) use of antithrombotics/antiplatelets: Secondary | ICD-10-CM

## 2015-11-24 DIAGNOSIS — N390 Urinary tract infection, site not specified: Secondary | ICD-10-CM | POA: Diagnosis not present

## 2015-11-24 DIAGNOSIS — E1121 Type 2 diabetes mellitus with diabetic nephropathy: Secondary | ICD-10-CM | POA: Diagnosis present

## 2015-11-24 DIAGNOSIS — Z96642 Presence of left artificial hip joint: Secondary | ICD-10-CM | POA: Diagnosis present

## 2015-11-24 DIAGNOSIS — E1122 Type 2 diabetes mellitus with diabetic chronic kidney disease: Secondary | ICD-10-CM | POA: Diagnosis present

## 2015-11-24 DIAGNOSIS — E860 Dehydration: Secondary | ICD-10-CM | POA: Diagnosis present

## 2015-11-24 DIAGNOSIS — Z79899 Other long term (current) drug therapy: Secondary | ICD-10-CM

## 2015-11-24 DIAGNOSIS — Z955 Presence of coronary angioplasty implant and graft: Secondary | ICD-10-CM

## 2015-11-24 DIAGNOSIS — I129 Hypertensive chronic kidney disease with stage 1 through stage 4 chronic kidney disease, or unspecified chronic kidney disease: Secondary | ICD-10-CM | POA: Diagnosis present

## 2015-11-24 DIAGNOSIS — W19XXXA Unspecified fall, initial encounter: Secondary | ICD-10-CM

## 2015-11-24 DIAGNOSIS — Z8673 Personal history of transient ischemic attack (TIA), and cerebral infarction without residual deficits: Secondary | ICD-10-CM

## 2015-11-24 DIAGNOSIS — S72001A Fracture of unspecified part of neck of right femur, initial encounter for closed fracture: Secondary | ICD-10-CM | POA: Diagnosis present

## 2015-11-24 DIAGNOSIS — K219 Gastro-esophageal reflux disease without esophagitis: Secondary | ICD-10-CM | POA: Diagnosis present

## 2015-11-24 DIAGNOSIS — W050XXA Fall from non-moving wheelchair, initial encounter: Secondary | ICD-10-CM | POA: Diagnosis present

## 2015-11-24 DIAGNOSIS — I2510 Atherosclerotic heart disease of native coronary artery without angina pectoris: Secondary | ICD-10-CM | POA: Diagnosis present

## 2015-11-24 DIAGNOSIS — N400 Enlarged prostate without lower urinary tract symptoms: Secondary | ICD-10-CM | POA: Diagnosis present

## 2015-11-24 DIAGNOSIS — M25551 Pain in right hip: Secondary | ICD-10-CM | POA: Diagnosis not present

## 2015-11-24 DIAGNOSIS — Z87891 Personal history of nicotine dependence: Secondary | ICD-10-CM

## 2015-11-24 DIAGNOSIS — D62 Acute posthemorrhagic anemia: Secondary | ICD-10-CM | POA: Diagnosis not present

## 2015-11-24 DIAGNOSIS — M150 Primary generalized (osteo)arthritis: Secondary | ICD-10-CM | POA: Diagnosis not present

## 2015-11-24 NOTE — ED Notes (Signed)
Pt given 200 mcg of fentanyl en route.

## 2015-11-24 NOTE — ED Triage Notes (Signed)
Pt BIB from [facility] from home c/o fall about an hour ago. C/o R hip pain. Crepitus noted at femoral head. Shortening of R leg and R foot externally rotated. Distal pulse in tact. A&Ox4. Hx of stroke. Pt does take Plavix.

## 2015-11-24 NOTE — ED Provider Notes (Signed)
[name] DEPT Provider Note   CSN: [id number] Arrival date & time: 11/24/15  2319  By signing my name below, I, Higinio Plan, attest that this documentation has been prepared under the direction and in the presence of Sherwood Gambler, MD . Electronically Signed: Higinio Plan, Scribe. 11/24/2015. 11:47 PM.  History   Chief Complaint Chief Complaint  Patient presents with  . Hip Pain   The history is provided by the spouse. No language interpreter was used.   HPI Comments: Herbert Marquez is a 82 y.o. male who presents to the Emergency Department complaining of gradually worsening, right hip pain that occurred at 10:00 PM tonight. Per wife, pt was sitting in his wheelchair when he suddenly fell out onto his right side. She denies striking his head or loss of consciousness. Pt's wife notes pt uses a wheelchair due to a CVA that impacted his right side in April, 2017. She states PSHx of left hip arthroplasty on 03/09/11 Dr. Alvan Dame.   Past Medical History:  Diagnosis Date  . Arthritis    "pretty much all over"   . Basal cell carcinoma    "several burned off his body, face, head"  . BPH (benign prostatic hypertrophy)   . Coronary artery disease    a. s/p PCI of RCA in 2006  . CVA (cerebral infarction)    a. 06/2015: left thalamic and bilateral PCA  . GERD (gastroesophageal reflux disease)   . Hyperlipidemia   . Hypertension   . TIA (transient ischemic attack)    Approximately 6 weeks post-cardiac catheterization.   . Type II diabetes mellitus ([place])    "prediabetic; lost alot of weight; not diabetic now" (06/16/2015)    Patient Active Problem List   Diagnosis Date Noted  . Closed right hip fracture ([place]) 11/25/2015  . Citrobacter infection   . Controlled diabetes mellitus type 2 with complications ([place])   . Urinary frequency   . Sepsis ([place]) 11/05/2015  . Overactive bladder   . Sepsis, unspecified organism ([place]) 11/04/2015  . H/O: CVA (cerebrovascular accident) 11/04/2015  .  Palpitations 09/18/2015  . Cerebrovascular accident (CVA) due to bilateral occlusion of posterior cerebral arteries ([place]) 09/18/2015  . DM type 2 with diabetic peripheral neuropathy ([place])   . Gait disturbance, post-stroke 06/23/2015  . 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 Alterations of sensations following CVA (cerebrovascular accident) 06/23/2015  . Thalamic infarction ([place]) 06/21/2015  . Sinus tachycardia ([place]) 06/20/2015  . Benign essential HTN   . Type 2 diabetes mellitus with complication, without long-term current use of insulin ([place])   . CAD in native artery   . Dysphagia as late effect of cerebrovascular disease   . Leukocytosis   . Hypokalemia   . Hyponatremia   . AKI (acute kidney injury) ([place])   . PSVT (paroxysmal supraventricular tachycardia) ([place])   . HLD (hyperlipidemia)   . Syncope 06/16/2015  . UTI (lower urinary tract infection) 08/31/2011  . Fracture of femoral neck, left ([place]) 03/09/2011  . Obesity 03/09/2011  . COPD (chronic obstructive pulmonary disease) ([place]) 03/09/2011    Past Surgical History:  Procedure Laterality Date  . CARDIOVASCULAR STRESS TEST  07/01/2007   EF 74%  . CATARACT EXTRACTION, BILATERAL    . CORONARY ANGIOPLASTY WITH STENT PLACEMENT  10/2004   stenting x 2 to RCA  . HERNIA REPAIR    . HIP ARTHROPLASTY  03/09/2011   Procedure: ARTHROPLASTY BIPOLAR HIP;  Surgeon: Mauri Pole;  Location: [facility];  Service: Orthopedics;  Laterality: Left;  . LAPAROSCOPIC INCISIONAL / UMBILICAL / VENTRAL HERNIA REPAIR     "below his naval"    Home Medications    Prior to Admission medications   Medication Sig Start Date End Date Taking? Authorizing Provider  atorvastatin (LIPITOR) 80 MG tablet Take 1 tablet (80 mg total) by mouth daily. 07/08/15   Lavon Paganini Angiulli, PA-C  clopidogrel (PLAVIX) 75 MG tablet Take 1 tablet (75 mg total) by mouth daily. 07/08/15   Lavon Paganini Angiulli, PA-C  metoprolol tartrate (LOPRESSOR) 25 MG tablet Take 0.5 tablets (12.5  mg total) by mouth 2 (two) times daily. 07/08/15   Lavon Paganini Angiulli, PA-C  Multiple Vitamin (MULTIVITAMIN) tablet Take 1 tablet by mouth daily.     Historical Provider, MD  nitroGLYCERIN (NITROSTAT) 0.4 MG SL tablet Place 0.4 mg under the tongue every 5 (five) minutes as needed for chest pain.    Historical Provider, MD  pioglitazone (ACTOS) 30 MG tablet Take 0.5 tablets (15 mg total) by mouth daily. 07/08/15   Lavon Paganini Angiulli, PA-C  tamsulosin (FLOMAX) 0.4 MG CAPS capsule Take 0.4 mg by mouth every other day.     Historical Provider, MD    Family History Family History  Problem Relation Age of Onset  . Hypertension Mother   . Lung cancer Father   . Lung cancer Brother     Social History Social History  Substance Use Topics  . Smoking status: Former Smoker    Quit date: 03/06/1971  . Smokeless tobacco: Never Used  . Alcohol use 1.2 oz/week    1 Glasses of wine, 1 Cans of beer per week     Comment: 1/2 BEER AND 1 WINE     Allergies   Review of patient's allergies indicates no known allergies.   Review of Systems Review of Systems  Musculoskeletal: Positive for arthralgias.  Neurological: Negative for syncope.   Physical Exam Updated Vital Signs BP 163/80 (BP Location: Left Arm)   Pulse 66   Temp 97.5 F (36.4 C) (Oral)   Resp 18   SpO2 100%   Physical Exam  Constitutional: He is oriented to person, place, and time. He appears well-developed and well-nourished.  HENT:  Head: Normocephalic and atraumatic.  Right Ear: External ear normal.  Left Ear: External ear normal.  Nose: Nose normal.  Eyes: Right eye exhibits no discharge. Left eye exhibits no discharge.  Neck: Neck supple.  Cardiovascular: Normal rate, regular rhythm, normal heart sounds and intact distal pulses.   Pulmonary/Chest: Effort normal and breath sounds normal.  Abdominal: Soft. There is no tenderness.  Musculoskeletal: He exhibits no edema.       Right hip: He exhibits decreased range of motion and  tenderness.  RLE held in knee flexion. Pain with any type of ROM  Neurological: He is alert and oriented to person, place, and time.  Skin: Skin is warm and dry.  Nursing note and vitals reviewed.    ED Treatments / Results  Labs (all labs ordered are listed, but only abnormal results are displayed) Labs Reviewed  BASIC METABOLIC PANEL - Abnormal; Notable for the following:       Result Value   Sodium 134 (*)    Glucose, Bld 111 (*)    BUN 24 (*)    Creatinine, Ser 1.30 (*)    GFR calc non Af Amer 48 (*)    GFR calc Af Amer 56 (*)    All other components within normal limits  CBC WITH  DIFFERENTIAL/PLATELET - Abnormal; Notable for the following:    RBC 3.90 (*)    Hemoglobin 11.8 (*)    HCT 35.5 (*)    RDW 16.3 (*)    All other components within normal limits  PROTIME-INR  TYPE AND SCREEN    EKG  EKG Interpretation  Date/Time:  Friday November 25 2015 00:18:53 EDT Ventricular Rate:  57 PR Interval:    QRS Duration: 128 QT Interval:  448 QTC Calculation: 437 R Axis:   -124 Text Interpretation:  Sinus rhythm Prolonged PR interval Nonspecific intraventricular conduction delay Probable lateral infarct, age indeterminate rate is slower, otherwise not significantly change since April 2017 Confirmed by Regenia Skeeter MD, Jazsmin Couse [phone]) on 11/25/2015 12:24:58 AM       Radiology No results found.  Right hip Xray: Prelim: Acute intertrochanteric and subtrochanteric fracture of right hip with displaced lesser trochanter fragment.  -wrstevens   Procedures Procedures (including critical care time)  Medications Ordered in ED Medications  morphine 2 MG/ML injection 2 mg (2 mg Intravenous Given 11/25/15 0109)    DIAGNOSTIC STUDIES:  Oxygen Saturation is 100% on RA, normal by my interpretation.    COORDINATION OF CARE:  11:45 PM Discussed treatment plan with pt and wife at bedside and they agreed to plan.  Initial Impression / Assessment and Plan / ED Course  I have  reviewed the triage vital signs and the nursing notes.  Pertinent labs & imaging results that were available during my care of the patient were reviewed by me and considered in my medical decision making (see chart for details).  Clinical Course  Comment By Time  Concern for hip fracture. Will get labs, xray, IV morphine prn pain. He was awake/alert the whole time, syncope seems unlikely. No head injury Sherwood Gambler, MD 09/22 0000  Xray with trochanteric hip fracture. Consult ortho. Sherwood Gambler, MD 09/22 [phone]  Dr. Stann Mainland will see in AM. Asks to be NPO and may fix in AM. Sherwood Gambler, MD 09/22 [phone]    It wounds like a mechanical fall with him reaching and falling without LOC. NPO. IV pain meds prn.  I personally performed the services described in this documentation, which was scribed in my presence. The recorded information has been reviewed and is accurate.   Final Clinical Impressions(s) / ED Diagnoses   Final diagnoses:  Closed right hip fracture, initial encounter [facility])    New Prescriptions New Prescriptions   No medications on file     Sherwood Gambler, MD 11/25/15 0147

## 2015-11-24 NOTE — ED Notes (Signed)
Bed: [id number] Expected date:  Expected time:  Means of arrival:  Comments: Hip pain

## 2015-11-25 ENCOUNTER — Encounter (HOSPITAL_COMMUNITY): Payer: Self-pay | Admitting: Radiology

## 2015-11-25 ENCOUNTER — Inpatient Hospital Stay (HOSPITAL_COMMUNITY): Payer: Medicare Other

## 2015-11-25 DIAGNOSIS — I10 Essential (primary) hypertension: Secondary | ICD-10-CM | POA: Diagnosis not present

## 2015-11-25 DIAGNOSIS — R279 Unspecified lack of coordination: Secondary | ICD-10-CM | POA: Diagnosis not present

## 2015-11-25 DIAGNOSIS — I69351 Hemiplegia and hemiparesis following cerebral infarction affecting right dominant side: Secondary | ICD-10-CM | POA: Diagnosis not present

## 2015-11-25 DIAGNOSIS — S72001A Fracture of unspecified part of neck of right femur, initial encounter for closed fracture: Secondary | ICD-10-CM | POA: Diagnosis not present

## 2015-11-25 DIAGNOSIS — W19XXXA Unspecified fall, initial encounter: Secondary | ICD-10-CM | POA: Diagnosis not present

## 2015-11-25 DIAGNOSIS — E43 Unspecified severe protein-calorie malnutrition: Secondary | ICD-10-CM | POA: Diagnosis present

## 2015-11-25 DIAGNOSIS — R269 Unspecified abnormalities of gait and mobility: Secondary | ICD-10-CM | POA: Diagnosis not present

## 2015-11-25 DIAGNOSIS — Z8249 Family history of ischemic heart disease and other diseases of the circulatory system: Secondary | ICD-10-CM | POA: Diagnosis not present

## 2015-11-25 DIAGNOSIS — N400 Enlarged prostate without lower urinary tract symptoms: Secondary | ICD-10-CM | POA: Diagnosis not present

## 2015-11-25 DIAGNOSIS — R41841 Cognitive communication deficit: Secondary | ICD-10-CM | POA: Diagnosis not present

## 2015-11-25 DIAGNOSIS — Z4789 Encounter for other orthopedic aftercare: Secondary | ICD-10-CM | POA: Diagnosis not present

## 2015-11-25 DIAGNOSIS — Z955 Presence of coronary angioplasty implant and graft: Secondary | ICD-10-CM | POA: Diagnosis not present

## 2015-11-25 DIAGNOSIS — Z7902 Long term (current) use of antithrombotics/antiplatelets: Secondary | ICD-10-CM | POA: Diagnosis not present

## 2015-11-25 DIAGNOSIS — M79604 Pain in right leg: Secondary | ICD-10-CM | POA: Diagnosis not present

## 2015-11-25 DIAGNOSIS — I480 Paroxysmal atrial fibrillation: Secondary | ICD-10-CM | POA: Diagnosis not present

## 2015-11-25 DIAGNOSIS — M179 Osteoarthritis of knee, unspecified: Secondary | ICD-10-CM | POA: Diagnosis not present

## 2015-11-25 DIAGNOSIS — R51 Headache: Secondary | ICD-10-CM | POA: Diagnosis not present

## 2015-11-25 DIAGNOSIS — D649 Anemia, unspecified: Secondary | ICD-10-CM | POA: Diagnosis not present

## 2015-11-25 DIAGNOSIS — M84559D Pathological fracture in neoplastic disease, hip, unspecified, subsequent encounter for fracture with routine healing: Secondary | ICD-10-CM | POA: Diagnosis not present

## 2015-11-25 DIAGNOSIS — R296 Repeated falls: Secondary | ICD-10-CM | POA: Diagnosis not present

## 2015-11-25 DIAGNOSIS — S728X1A Other fracture of right femur, initial encounter for closed fracture: Secondary | ICD-10-CM | POA: Diagnosis not present

## 2015-11-25 DIAGNOSIS — N320 Bladder-neck obstruction: Secondary | ICD-10-CM | POA: Diagnosis not present

## 2015-11-25 DIAGNOSIS — R278 Other lack of coordination: Secondary | ICD-10-CM | POA: Diagnosis not present

## 2015-11-25 DIAGNOSIS — Z96642 Presence of left artificial hip joint: Secondary | ICD-10-CM | POA: Diagnosis present

## 2015-11-25 DIAGNOSIS — I2510 Atherosclerotic heart disease of native coronary artery without angina pectoris: Secondary | ICD-10-CM | POA: Diagnosis not present

## 2015-11-25 DIAGNOSIS — M25551 Pain in right hip: Secondary | ICD-10-CM | POA: Diagnosis not present

## 2015-11-25 DIAGNOSIS — S72141A Displaced intertrochanteric fracture of right femur, initial encounter for closed fracture: Secondary | ICD-10-CM | POA: Diagnosis present

## 2015-11-25 DIAGNOSIS — I4891 Unspecified atrial fibrillation: Secondary | ICD-10-CM | POA: Diagnosis not present

## 2015-11-25 DIAGNOSIS — S72009A Fracture of unspecified part of neck of unspecified femur, initial encounter for closed fracture: Secondary | ICD-10-CM | POA: Diagnosis not present

## 2015-11-25 DIAGNOSIS — E118 Type 2 diabetes mellitus with unspecified complications: Secondary | ICD-10-CM | POA: Diagnosis not present

## 2015-11-25 DIAGNOSIS — Z87891 Personal history of nicotine dependence: Secondary | ICD-10-CM | POA: Diagnosis not present

## 2015-11-25 DIAGNOSIS — E860 Dehydration: Secondary | ICD-10-CM | POA: Diagnosis present

## 2015-11-25 DIAGNOSIS — Z8673 Personal history of transient ischemic attack (TIA), and cerebral infarction without residual deficits: Secondary | ICD-10-CM | POA: Diagnosis not present

## 2015-11-25 DIAGNOSIS — K219 Gastro-esophageal reflux disease without esophagitis: Secondary | ICD-10-CM | POA: Diagnosis present

## 2015-11-25 DIAGNOSIS — S72001S Fracture of unspecified part of neck of right femur, sequela: Secondary | ICD-10-CM | POA: Diagnosis not present

## 2015-11-25 DIAGNOSIS — J449 Chronic obstructive pulmonary disease, unspecified: Secondary | ICD-10-CM | POA: Diagnosis not present

## 2015-11-25 DIAGNOSIS — E1121 Type 2 diabetes mellitus with diabetic nephropathy: Secondary | ICD-10-CM | POA: Diagnosis present

## 2015-11-25 DIAGNOSIS — M6281 Muscle weakness (generalized): Secondary | ICD-10-CM | POA: Diagnosis not present

## 2015-11-25 DIAGNOSIS — W050XXA Fall from non-moving wheelchair, initial encounter: Secondary | ICD-10-CM | POA: Diagnosis not present

## 2015-11-25 DIAGNOSIS — S72002A Fracture of unspecified part of neck of left femur, initial encounter for closed fracture: Secondary | ICD-10-CM | POA: Insufficient documentation

## 2015-11-25 DIAGNOSIS — I129 Hypertensive chronic kidney disease with stage 1 through stage 4 chronic kidney disease, or unspecified chronic kidney disease: Secondary | ICD-10-CM | POA: Diagnosis present

## 2015-11-25 DIAGNOSIS — Z79899 Other long term (current) drug therapy: Secondary | ICD-10-CM | POA: Diagnosis not present

## 2015-11-25 DIAGNOSIS — S72141D Displaced intertrochanteric fracture of right femur, subsequent encounter for closed fracture with routine healing: Secondary | ICD-10-CM | POA: Diagnosis not present

## 2015-11-25 DIAGNOSIS — E1122 Type 2 diabetes mellitus with diabetic chronic kidney disease: Secondary | ICD-10-CM | POA: Diagnosis present

## 2015-11-25 DIAGNOSIS — N182 Chronic kidney disease, stage 2 (mild): Secondary | ICD-10-CM | POA: Diagnosis present

## 2015-11-25 DIAGNOSIS — R2681 Unsteadiness on feet: Secondary | ICD-10-CM | POA: Diagnosis not present

## 2015-11-25 DIAGNOSIS — S7221XA Displaced subtrochanteric fracture of right femur, initial encounter for closed fracture: Secondary | ICD-10-CM | POA: Diagnosis present

## 2015-11-25 DIAGNOSIS — E119 Type 2 diabetes mellitus without complications: Secondary | ICD-10-CM | POA: Diagnosis not present

## 2015-11-25 DIAGNOSIS — D62 Acute posthemorrhagic anemia: Secondary | ICD-10-CM | POA: Diagnosis not present

## 2015-11-25 DIAGNOSIS — R531 Weakness: Secondary | ICD-10-CM | POA: Diagnosis not present

## 2015-11-25 DIAGNOSIS — I481 Persistent atrial fibrillation: Secondary | ICD-10-CM | POA: Diagnosis not present

## 2015-11-25 DIAGNOSIS — E785 Hyperlipidemia, unspecified: Secondary | ICD-10-CM | POA: Diagnosis not present

## 2015-11-25 HISTORY — DX: Chronic kidney disease, stage 2 (mild): N18.2

## 2015-11-25 LAB — PROTIME-INR
INR: 1.13
Prothrombin Time: 14.5 seconds (ref 11.4–15.2)

## 2015-11-25 LAB — CBC WITH DIFFERENTIAL/PLATELET
Basophils Absolute: 0 10*3/uL (ref 0.0–0.1)
Basophils Relative: 1 %
Eosinophils Absolute: 0.1 10*3/uL (ref 0.0–0.7)
Eosinophils Relative: 2 %
HCT: 35.5 % — ABNORMAL LOW (ref 39.0–52.0)
Hemoglobin: 11.8 g/dL — ABNORMAL LOW (ref 13.0–17.0)
Lymphocytes Relative: 20 %
Lymphs Abs: 1.4 10*3/uL (ref 0.7–4.0)
MCH: 30.3 pg (ref 26.0–34.0)
MCHC: 33.2 g/dL (ref 30.0–36.0)
MCV: 91 fL (ref 78.0–100.0)
Monocytes Absolute: 0.8 10*3/uL (ref 0.1–1.0)
Monocytes Relative: 12 %
Neutro Abs: 4.5 10*3/uL (ref 1.7–7.7)
Neutrophils Relative %: 65 %
Platelets: 391 10*3/uL (ref 150–400)
RBC: 3.9 MIL/uL — ABNORMAL LOW (ref 4.22–5.81)
RDW: 16.3 % — ABNORMAL HIGH (ref 11.5–15.5)
WBC: 6.9 10*3/uL (ref 4.0–10.5)

## 2015-11-25 LAB — GLUCOSE, CAPILLARY
Glucose-Capillary: 104 mg/dL — ABNORMAL HIGH (ref 65–99)
Glucose-Capillary: 101 mg/dL — ABNORMAL HIGH (ref 65–99)
Glucose-Capillary: 133 mg/dL — ABNORMAL HIGH (ref 65–99)
Glucose-Capillary: 142 mg/dL — ABNORMAL HIGH (ref 65–99)

## 2015-11-25 LAB — APTT: aPTT: 31 seconds (ref 24–36)

## 2015-11-25 LAB — BASIC METABOLIC PANEL
Anion gap: 9 (ref 5–15)
BUN: 24 mg/dL — ABNORMAL HIGH (ref 6–20)
CO2: 22 mmol/L (ref 22–32)
Calcium: 9.2 mg/dL (ref 8.9–10.3)
Chloride: 103 mmol/L (ref 101–111)
Creatinine, Ser: 1.3 mg/dL — ABNORMAL HIGH (ref 0.61–1.24)
GFR calc Af Amer: 56 mL/min — ABNORMAL LOW (ref >60)
GFR calc non Af Amer: 48 mL/min — ABNORMAL LOW (ref >60)
Glucose, Bld: 111 mg/dL — ABNORMAL HIGH (ref 65–99)
Potassium: 4.1 mmol/L (ref 3.5–5.1)
Sodium: 134 mmol/L — ABNORMAL LOW (ref 135–145)

## 2015-11-25 LAB — SURGICAL PCR SCREEN
MRSA, PCR: NEGATIVE
Staphylococcus aureus: NEGATIVE

## 2015-11-25 MED ORDER — NITROGLYCERIN 0.4 MG SL SUBL: 0.4000 mg | SUBLINGUAL_TABLET | SUBLINGUAL | Status: DC | PRN

## 2015-11-25 MED ORDER — METHOCARBAMOL 500 MG PO TABS
500.0000 mg | ORAL_TABLET | Freq: Three times a day (TID) | ORAL | Status: DC | PRN
  Administered 2015-11-25: 500 mg via ORAL
  Filled 2015-11-25: qty 1

## 2015-11-25 MED ORDER — HYDRALAZINE HCL 20 MG/ML IJ SOLN: 5.0000 mg | INTRAMUSCULAR | Status: DC | PRN

## 2015-11-25 MED ORDER — ATORVASTATIN CALCIUM 40 MG PO TABS
80.0000 mg | ORAL_TABLET | Freq: Every day | ORAL | Status: DC
  Administered 2015-11-25 – 2015-11-29 (×4): 80 mg via ORAL
  Filled 2015-11-25 (×4): qty 2

## 2015-11-25 MED ORDER — METOPROLOL TARTRATE 12.5 MG HALF TABLET
12.5000 mg | ORAL_TABLET | Freq: Two times a day (BID) | ORAL | Status: DC
  Administered 2015-11-25 (×2): 12.5 mg via ORAL
  Filled 2015-11-25 (×6): qty 1

## 2015-11-25 MED ORDER — ONDANSETRON HCL 4 MG/2ML IJ SOLN
4.0000 mg | Freq: Three times a day (TID) | INTRAMUSCULAR | Status: DC | PRN
Start: 2015-11-25 — End: 2015-11-26

## 2015-11-25 MED ORDER — ENSURE ENLIVE PO LIQD
237.0000 mL | Freq: Two times a day (BID) | ORAL | Status: DC
  Administered 2015-11-26 – 2015-11-30 (×7): 237 mL via ORAL

## 2015-11-25 MED ORDER — LORAZEPAM 2 MG/ML IJ SOLN
1.0000 mg | Freq: Once | INTRAMUSCULAR | Status: AC
  Administered 2015-11-25: 1 mg via INTRAVENOUS
  Filled 2015-11-25: qty 1

## 2015-11-25 MED ORDER — OXYCODONE-ACETAMINOPHEN 5-325 MG PO TABS
1.0000 | ORAL_TABLET | ORAL | Status: DC | PRN
  Administered 2015-11-25: 1 via ORAL
  Filled 2015-11-25: qty 1

## 2015-11-25 MED ORDER — POLYETHYLENE GLYCOL 3350 17 G PO PACK: 17.0000 g | PACK | Freq: Every day | ORAL | Status: DC | PRN

## 2015-11-25 MED ORDER — TAMSULOSIN HCL 0.4 MG PO CAPS
0.4000 mg | ORAL_CAPSULE | ORAL | Status: DC
  Administered 2015-11-25 – 2015-11-29 (×3): 0.4 mg via ORAL
  Filled 2015-11-25 (×4): qty 1

## 2015-11-25 MED ORDER — INSULIN ASPART 100 UNIT/ML ~~LOC~~ SOLN
0.0000 [IU] | Freq: Three times a day (TID) | SUBCUTANEOUS | Status: DC
  Administered 2015-11-26 – 2015-11-27 (×4): 1 [IU] via SUBCUTANEOUS
  Administered 2015-11-28 (×2): 2 [IU] via SUBCUTANEOUS
  Administered 2015-11-28: 1 [IU] via SUBCUTANEOUS
  Administered 2015-11-29: 2 [IU] via SUBCUTANEOUS
  Administered 2015-11-29: 1 [IU] via SUBCUTANEOUS
  Administered 2015-11-29 – 2015-11-30 (×2): 2 [IU] via SUBCUTANEOUS

## 2015-11-25 MED ORDER — SODIUM CHLORIDE 0.9 % IV SOLN
INTRAVENOUS | Status: DC
Start: 2015-11-25 — End: 2015-11-26
  Administered 2015-11-25: 03:00:00 via INTRAVENOUS

## 2015-11-25 MED ORDER — INSULIN ASPART 100 UNIT/ML ~~LOC~~ SOLN
0.0000 [IU] | Freq: Every day | SUBCUTANEOUS | Status: DC
Start: 2015-11-25 — End: 2015-11-30

## 2015-11-25 MED ORDER — MORPHINE SULFATE (PF) 2 MG/ML IV SOLN
2.0000 mg | INTRAVENOUS | Status: DC | PRN
  Administered 2015-11-25: 2 mg via INTRAVENOUS
  Filled 2015-11-25: qty 1

## 2015-11-25 MED ORDER — POVIDONE-IODINE 10 % EX SWAB: 2.0000 "application " | Freq: Once | CUTANEOUS | Status: DC

## 2015-11-25 MED ORDER — MORPHINE SULFATE (PF) 2 MG/ML IV SOLN
2.0000 mg | INTRAVENOUS | Status: AC | PRN
  Administered 2015-11-25 (×2): 2 mg via INTRAVENOUS
  Filled 2015-11-25 (×2): qty 1

## 2015-11-25 MED ORDER — CHLORHEXIDINE GLUCONATE 4 % EX LIQD
60.0000 mL | Freq: Once | CUTANEOUS | Status: DC
  Filled 2015-11-25: qty 60

## 2015-11-25 MED ORDER — DOCUSATE SODIUM 100 MG PO CAPS
100.0000 mg | ORAL_CAPSULE | Freq: Two times a day (BID) | ORAL | Status: DC
  Administered 2015-11-25: 100 mg via ORAL
  Filled 2015-11-25: qty 1

## 2015-11-25 MED ORDER — MORPHINE SULFATE (PF) 2 MG/ML IV SOLN
1.0000 mg | INTRAVENOUS | Status: DC | PRN
  Administered 2015-11-27: 1 mg via INTRAVENOUS
  Filled 2015-11-25: qty 1

## 2015-11-25 MED ORDER — CEFAZOLIN SODIUM-DEXTROSE 2-4 GM/100ML-% IV SOLN: 2.0000 g | INTRAVENOUS | Status: DC

## 2015-11-25 MED ORDER — ADULT MULTIVITAMIN W/MINERALS CH
1.0000 | ORAL_TABLET | Freq: Every day | ORAL | Status: DC
  Administered 2015-11-27 – 2015-11-30 (×4): 1 via ORAL
  Filled 2015-11-25 (×4): qty 1

## 2015-11-25 MED ORDER — ACETAMINOPHEN 500 MG PO TABS
1000.0000 mg | ORAL_TABLET | Freq: Four times a day (QID) | ORAL | Status: DC | PRN
  Administered 2015-11-25 – 2015-11-26 (×2): 1000 mg via ORAL
  Filled 2015-11-25: qty 2

## 2015-11-25 NOTE — Progress Notes (Signed)
OT Cancellation Note  Patient Details Name: Herbert Marquez MRN: [id number] DOB: 1927/04/22   Cancelled Treatment:    Reason Eval/Treat Not Completed: Other (comment).  Awaiting ortho consult. Will check back  [name] 11/25/2015, 7:17 AM  Lesle Chris, OTR/L [phone] 11/25/2015

## 2015-11-25 NOTE — Progress Notes (Signed)
Initial Nutrition Assessment  DOCUMENTATION CODES:   Severe malnutrition in context of acute illness/injury  INTERVENTION:  Ensure Enlive po BID, each supplement provides 350 kcal and 20 grams of protein.  Encouraged snacks TID between meals. Discussed choosing adequate protein for meals and snacks.  NUTRITION DIAGNOSIS:   Increased nutrient needs related to wound healing (s/p operation) as evidenced by estimated needs.  GOAL:   Patient will meet greater than or equal to 90% of their needs  MONITOR:   PO intake, Supplement acceptance, Weight trends, I & O's  REASON FOR ASSESSMENT:   Consult Assessment of nutrition requirement/status  ASSESSMENT:   82 y.o. male with medical history significant of hypertension, hyperlipidemia, diabetes mellitus, GERD, TIA, stroke with mild right-sided weakness, CAD, s/p of stent placement, BPH, arthritis, CKD-II, who presents with right hip pain after fall.   Found to have right hip IT fracture. Plan for operative intervention tomorrow morning.  Patient sleeping at time of assessment. Wife and daughter present in room. They report that in April he had a stroke and had initially not been eating well (<50% intake for at least 1 week) and had lost weight. However, they report his appetite and intake have improved since then. He has been eating 3 meals/day with snacks occasionally.   Per chart it appears patient's UBW was in the range of 192-197 lbs. Family agreed. He lost 9.3 kg (11% body weight) over approximately 2 weeks (4/18-5/06) and has not since regained weight. Family had not realized he was still losing weight/hadn't made progress towards gaining weight back.   Usual intake:  Breakfast - cereal, or eggs and toast Lunch - soup or sandwich Dinner - chicken or fish with vegetables and potatoes Snacks - popcorn  Medications reviewed and include: Colace, Novolog sliding scale TID with meals, MVI daily, NS @ 75 ml/hr.  Labs reviewed: CBG  101-104, Sodium 134.   Nutrition-Focused physical exam completed. Findings are mild fat depletion, moderate muscle depletion. Unable to assess lower body including edema.   Discussed plan with RN. Surgery was originally planned for today, so patient was NPO, but then team decided to do surgery tomorrow. He was put on a regular diet, but has not been able to eat because he has been sleeping.   Diet Order:  Diet regular Room service appropriate? Yes; Fluid consistency: Thin Diet NPO time specified  Skin:  Reviewed, no issues  Last BM:  11/24/2015  Height:   Ht Readings from Last 1 Encounters:  11/25/15 5\' 7"  (1.702 m)    Weight:   Wt Readings from Last 1 Encounters:  11/25/15 171 lb 15.3 oz (78 kg)    Ideal Body Weight:  67.27 kg  BMI:  Body mass index is 26.93 kg/m.  Estimated Nutritional Needs:   Kcal:  1700-2000  Protein:  95-110 grams  Fluid:  1.7-2 L/day  EDUCATION NEEDS:   Education needs addressed (Increased protein and calorie needs post-operatively.)  Willey Blade, MS, RD, LDN Pager: [phone] After Hours Pager: [phone]

## 2015-11-25 NOTE — Care Management Note (Signed)
Case Management Note  Patient Details  Name: MIHAI VERZOSA MRN: [id number] Date of Birth: [birth year]  Subjective/Objective:             fx of the hip        Action/Plan: or today [id number]   Expected Discharge Date:                  Expected Discharge Plan:  Skilled Nursing Facility  In-House Referral:  Clinical Social Work  Discharge planning Services     Post Acute Care Choice:    Choice offered to:     DME Arranged:    DME Agency:     HH Arranged:    [place] Agency:     Status of Service:  In process, will continue to follow  If discussed at Long Length of Stay Meetings, dates discussed:    Additional Comments:Date:  November 25, 2015 Chart reviewed for concurrent status and case management needs. Will continue to follow the patient for status change: Discharge Planning: following for needs Expected discharge date: [id number] Velva Harman, BSN, [place], [place]  Leeroy Cha, RN 11/25/2015, 11:34 AM

## 2015-11-25 NOTE — Clinical Social Work Note (Signed)
Clinical Social Work Assessment  Patient Details  Name: Herbert Marquez MRN: [id number] Date of Birth: 1927-12-09  Date of referral:  11/25/15               Reason for consult:  Facility Placement                Permission sought to share information with:  Facility Sport and exercise psychologist, Family Supports Permission granted to share information::     Name::        Agency::   SNF  Relationship::    Spouse  Contact Information:   Zevion [phone]  Housing/Transportation Living arrangements for the past 2 months:  Apartment Source of Information:  Spouse Patient Interpreter Needed:  None Criminal Activity/Legal Involvement Pertinent to Current Situation/Hospitalization:  No - Comment as needed Significant Relationships:  Spouse, Adult Children Lives with:  Spouse Do you feel safe going back to the place where you live?  Yes Need for family participation in patient care:  Yes (Comment)  Care giving concerns: Patient wife reports the patient fractured his hip after a falling from his wheel chair at home. She reports the patient has been receiving home health services since his stroke in April. She report the patient has been doing well with his physical therapy and believes the patient will give pushback about going to SNF. Pt. Wife is open to SNF and will discuss it with patient and daughter.   Social Worker assessment / plan: Patient sleep during assessment. LCSWA spoke with patient wife about patient potential rehab. The family is still deciding SNF VS. HOME and will like to discuss before making a decision.  LCSWA will fax patient information and provide SNF choices. Patient family is interested in [place] or [place] if they decide on facility. [place] educated spouse about SNF process and Bed offers.   Employment status:  Retired Forensic scientist:  Medicare PT Recommendations:  Not assessed at this time [place] / Referral to community resources:  [place]  Patient/Family's Response to care: Agreeable to care.   Patient/Family's Understanding of and Emotional Response to Diagnosis, Current Treatment, and Prognosis:  " He has been doing better since his stroke, now this happens. We still need time to discuss the next step."  Emotional Assessment Appearance:  Appears stated age Attitude/Demeanor/Rapport:    Affect (typically observed):  Accepting Orientation:  Oriented to Place, Oriented to Self Alcohol / Substance use:  Not Applicable Psych involvement (Current and /or in the community):  No (Comment)  Discharge Needs  Concerns to be addressed:  Discharge Planning Concerns, Care Coordination Readmission within the last 30 days:  Yes Current discharge risk:  None Barriers to Discharge:  Continued Medical Work up   [employer], LCSW 11/25/2015, 7:12 PM

## 2015-11-25 NOTE — Progress Notes (Signed)
PT Cancellation Note  Patient Details Name: Herbert Marquez MRN: [id number] DOB: Feb 08, 1928   Cancelled Treatment:    Reason Eval/Treat Not Completed: Medical issues which prohibited therapy. Pt with Right hip fracture-awaiting ortho consult. Will sign off at this time. Ortho MD please reorder PT/indicate WB status and activity level once pt is medically appropriate. Thanks.   [place], MPT Pager: [phone]

## 2015-11-25 NOTE — Progress Notes (Signed)
Pt admitted after midnight, please see earlier note by Dr. Blaine Hamper. Pt admitted after an episode of fall at home as he was trying to reach his drink from wheelchair and he fell on his right side. He has sustained R hip fracture and ortho team consulted. Plan to take pt to OR in AM. Advance diet today and keep NPO after midnight.   Faye Ramsay, MD  [facility] Pager [phone]  If 7PM-7AM, please contact night-coverage [email or web address] Password [email or web address]

## 2015-11-25 NOTE — Progress Notes (Signed)
OT Cancellation Note  Patient Details Name: Herbert Marquez MRN: [id number] DOB: 1927-03-07   Cancelled Treatment:    Reason Eval/Treat Not Completed: Other (comment).  Please reorder OT after sx.  Janaysia Mcleroy 11/25/2015, 12:47 PM  Lesle Chris, OTR/L [phone] 11/25/2015

## 2015-11-25 NOTE — NC FL2 (Deleted)
[place] LEVEL OF CARE SCREENING TOOL     IDENTIFICATION  Patient Name: Herbert Marquez Birthdate: 12/06/1927 Sex: male Admission Date (Current Location): 11/24/2015  [facility] and [place] Number:  Herbalist and Address:  [name],  [place] [address], [place]      Provider Number: [id number]  Attending Physician Name and Address:  Theodis Blaze, MD  Relative Name and Phone Number:       Current Level of Care: Hospital Recommended Level of Care: [place] Prior Approval Number:    Date Approved/Denied:   PASRR Number:    Discharge Plan: SNF    Current Diagnoses: Patient Active Problem List   Diagnosis Date Noted  . Closed right hip fracture ([place]) 11/25/2015  . BPH (benign prostatic hyperplasia) 11/25/2015  . GERD (gastroesophageal reflux disease) 11/25/2015  . CKD (chronic kidney disease), stage II 11/25/2015  . Closed left hip fracture ([place]) 11/25/2015  . Citrobacter infection   . Controlled diabetes mellitus type 2 with complications ([place])   . Urinary frequency   . Sepsis ([place]) 11/05/2015  . Overactive bladder   . Sepsis, unspecified organism ([place]) 11/04/2015  . H/O: CVA (cerebrovascular accident) 11/04/2015  . Palpitations 09/18/2015  . Cerebrovascular accident (CVA) due to bilateral occlusion of posterior cerebral arteries ([place]) 09/18/2015  . DM type 2 with diabetic peripheral neuropathy ([place])   . Gait disturbance, post-stroke 06/23/2015  . 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 Alterations of sensations following CVA (cerebrovascular accident) 06/23/2015  . Thalamic infarction ([place]) 06/21/2015  . Sinus tachycardia ([place]) 06/20/2015  . Benign essential HTN   . Type 2 diabetes mellitus with complication, without long-term current use of insulin ([place])   . CAD in native artery   . Dysphagia as late effect of cerebrovascular disease   . Leukocytosis   . Hypokalemia   . Hyponatremia   . AKI  (acute kidney injury) ([place])   . PSVT (paroxysmal supraventricular tachycardia) ([place])   . HLD (hyperlipidemia)   . Syncope 06/16/2015  . UTI (lower urinary tract infection) 08/31/2011  . Fracture of femoral neck, left ([place]) 03/09/2011  . Obesity 03/09/2011  . COPD (chronic obstructive pulmonary disease) ([place]) 03/09/2011    Orientation RESPIRATION BLADDER Height & Weight     Self, Place  Normal Incontinent Weight: 171 lb 15.3 oz (78 kg) Height:  5\' 7"  (170.2 cm)  BEHAVIORAL SYMPTOMS/MOOD NEUROLOGICAL BOWEL NUTRITION STATUS      Continent Diet (regular)  AMBULATORY STATUS COMMUNICATION OF NEEDS Skin   Limited Assist Verbally Normal                       Personal Care Assistance Level of Assistance  Bathing, Dressing Bathing Assistance: Limited assistance   Dressing Assistance: Limited assistance     Functional Limitations Info             SPECIAL CARE FACTORS FREQUENCY  PT (By licensed PT), OT (By licensed OT)     PT Frequency: 5 OT Frequency: 5            Contractures      Additional Factors Info  Code Status, Allergies Code Status Info: Full code Allergies Info: NKA           Current Medications (11/25/2015):  This is the current hospital active medication list Current Facility-Administered Medications  Medication Dose Route Frequency Provider Last Rate Last Dose  . 0.9 %  sodium chloride  infusion   Intravenous Continuous Ivor Costa, MD 75 mL/hr at 11/25/15 0252    . acetaminophen (TYLENOL) tablet 1,000 mg  1,000 mg Oral Q6H PRN Ivor Costa, MD   1,000 mg at 11/25/15 2123  . atorvastatin (LIPITOR) tablet 80 mg  80 mg Oral Daily Ivor Costa, MD   80 mg at 11/25/15 1709  . ceFAZolin (ANCEF) IVPB 2g/100 mL premix  2 g Intravenous On Call to [place], MD      . chlorhexidine (HIBICLENS) 4 % liquid 4 application  60 mL Topical Once Nicholes Stairs, MD      . docusate sodium (COLACE) capsule 100 mg  100 mg Oral BID Ivor Costa, MD   100 mg at  11/25/15 2123  . feeding supplement (ENSURE ENLIVE) (ENSURE ENLIVE) liquid 237 mL  237 mL Oral BID BM Theodis Blaze, MD      . hydrALAZINE (APRESOLINE) injection 5 mg  5 mg Intravenous Q2H PRN Ivor Costa, MD      . insulin aspart (novoLOG) injection 0-5 Units  0-5 Units Subcutaneous QHS Ivor Costa, MD      . insulin aspart (novoLOG) injection 0-9 Units  0-9 Units Subcutaneous TID WC Ivor Costa, MD      . methocarbamol (ROBAXIN) tablet 500 mg  500 mg Oral Q8H PRN Ivor Costa, MD   500 mg at 11/25/15 1535  . metoprolol tartrate (LOPRESSOR) tablet 12.5 mg  12.5 mg Oral BID Ivor Costa, MD   12.5 mg at 11/25/15 2123  . morphine 2 MG/ML injection 1 mg  1 mg Intravenous Q4H PRN Theodis Blaze, MD      . multivitamin with minerals tablet 1 tablet  1 tablet Oral Daily Ivor Costa, MD      . nitroGLYCERIN (NITROSTAT) SL tablet 0.4 mg  0.4 mg Sublingual Q5 min PRN Ivor Costa, MD      . ondansetron [facility]) injection 4 mg  4 mg Intravenous Q8H PRN Ivor Costa, MD      . oxyCODONE-acetaminophen (PERCOCET/ROXICET) 5-325 MG per tablet 1 tablet  1 tablet Oral Q4H PRN Ivor Costa, MD   1 tablet at 11/25/15 1709  . polyethylene glycol (MIRALAX / GLYCOLAX) packet 17 g  17 g Oral Daily PRN Ivor Costa, MD      . povidone-iodine 10 % swab 2 application  2 application Topical Once Nicholes Stairs, MD      . tamsulosin [facility]) capsule 0.4 mg  0.4 mg Oral QODAY Ivor Costa, MD   0.4 mg at 11/25/15 1535     Discharge Medications: Please see discharge summary for a list of discharge medications.  Relevant Imaging Results:  Relevant Lab Results:   Additional [place], LCSW

## 2015-11-25 NOTE — Consult Note (Signed)
ORTHOPAEDIC CONSULTATION  REQUESTING PHYSICIAN: Theodis Blaze, MD  PCP:  Thressa Sheller, MD  Chief Complaint: Right hip pain  HPI: Herbert Marquez is a 82 y.o. male who complains of  Right hip pain following a fall from his wheelchair yesterday afternoon.  This was a witnessed fall by his wife.  He had immediate pain and was taken to the ED for evaluation and found to have comminuted right hip IT fracture.  He has a hx of right sided stroke and is left with some sensory deficit and weakness of the RLE, but does ambulate in his home at baseline with wheelchair for out of home mobility.  Past Medical History:  Diagnosis Date  . Arthritis    "pretty much all over"   . Basal cell carcinoma    "several burned off his body, face, head"  . BPH (benign prostatic hypertrophy)   . Coronary artery disease    a. s/p PCI of RCA in 2006  . CVA (cerebral infarction)    a. 06/2015: left thalamic and bilateral PCA  . GERD (gastroesophageal reflux disease)   . Hyperlipidemia   . Hypertension   . TIA (transient ischemic attack)    Approximately 6 weeks post-cardiac catheterization.   . Type II diabetes mellitus ([place])    "prediabetic; lost alot of weight; not diabetic now" (06/16/2015)   Past Surgical History:  Procedure Laterality Date  . CARDIOVASCULAR STRESS TEST  07/01/2007   EF 74%  . CATARACT EXTRACTION, BILATERAL    . CORONARY ANGIOPLASTY WITH STENT PLACEMENT  10/2004   stenting x 2 to RCA  . HERNIA REPAIR    . HIP ARTHROPLASTY  03/09/2011   Procedure: ARTHROPLASTY BIPOLAR HIP;  Surgeon: Mauri Pole;  Location: [facility] ORS;  Service: Orthopedics;  Laterality: Left;  . LAPAROSCOPIC INCISIONAL / UMBILICAL / VENTRAL HERNIA REPAIR     "below his naval"   Social History   Social History  . Marital status: Married    Spouse name: N/A  . Number of children: N/A  . Years of education: N/A   Occupational History  . Retired Other   Social History Main Topics  . Smoking status: Former  Smoker    Quit date: 03/06/1971  . Smokeless tobacco: Never Used  . Alcohol use 1.2 oz/week    1 Glasses of wine, 1 Cans of beer per week     Comment: 1/2 BEER AND 1 WINE  . Drug use: No  . Sexual activity: No   Other Topics Concern  . None   Social History Narrative   Lives in [place], [place] with wife. Has 3 children.    Family History  Problem Relation Age of Onset  . Hypertension Mother   . Lung cancer Father   . Lung cancer Brother    No Known Allergies Prior to Admission medications   Medication Sig Start Date End Date Taking? Authorizing Provider  acetaminophen (TYLENOL) 500 MG tablet Take 1,000 mg by mouth every 6 (six) hours as needed for mild pain.   Yes Historical Provider, MD  atorvastatin (LIPITOR) 80 MG tablet Take 1 tablet (80 mg total) by mouth daily. 07/08/15  Yes Daniel J Angiulli, PA-C  clopidogrel (PLAVIX) 75 MG tablet Take 1 tablet (75 mg total) by mouth daily. 07/08/15  Yes Daniel J Angiulli, PA-C  metoprolol tartrate (LOPRESSOR) 25 MG tablet Take 0.5 tablets (12.5 mg total) by mouth 2 (two) times daily. 07/08/15  Yes Daniel J Angiulli, PA-C  Multiple Vitamin (  MULTIVITAMIN) tablet Take 1 tablet by mouth daily.    Yes Historical Provider, MD  nitroGLYCERIN (NITROSTAT) 0.4 MG SL tablet Place 0.4 mg under the tongue every 5 (five) minutes as needed for chest pain.   Yes Historical Provider, MD  pioglitazone (ACTOS) 30 MG tablet Take 0.5 tablets (15 mg total) by mouth daily. 07/08/15  Yes Daniel J Angiulli, PA-C  tamsulosin (FLOMAX) 0.4 MG CAPS capsule Take 0.4 mg by mouth every other day.    Yes Historical Provider, MD   Ct Head Wo Contrast  Result Date: 11/25/2015 CLINICAL DATA:  Status post fall last night. No loss consciousness. Mild headache. EXAM: CT HEAD WITHOUT CONTRAST CT CERVICAL SPINE WITHOUT CONTRAST TECHNIQUE: Multidetector CT imaging of the head and cervical spine was performed following the standard protocol without intravenous contrast. Multiplanar CT  image reconstructions of the cervical spine were also generated. COMPARISON:  06/17/2015 FINDINGS: CT HEAD FINDINGS Brain: No evidence of acute infarction, hemorrhage, extra-axial collection, ventriculomegaly, or mass effect. Old lacunar infarct in the left thalamus. Generalized cerebral atrophy. Periventricular white matter low attenuation likely secondary to microangiopathy. Vascular: Cerebrovascular atherosclerotic calcifications are noted. Skull: Negative for fracture or focal lesion. Sinuses/Orbits: Visualized portions of the orbits are unremarkable. Visualized portions of the paranasal sinuses and mastoid air cells are unremarkable. Other: None. CT CERVICAL SPINE FINDINGS Alignment: 2 mm of anterolisthesis of C4 on C5 and C5 on C6. 5 mm of anterolisthesis of C7 on T1. Anterolisthesis is secondary to facet arthropathy. Skull base and vertebrae: No acute fracture. No primary bone lesion. Erosive changes along the posterior aspect of the odontoid process with pannus formation as can be seen with an inflammatory arthropathy such as rheumatoid arthritis versus crystalline arthropathy. Soft tissues and spinal canal: No prevertebral fluid or swelling. No visible canal hematoma. Disc levels: Degenerative disc disease with disc height loss at C3-4, C4-5, C5-6, C6-7 and C7-T1. Mild bilateral facet arthropathy at C2-3. Severe left facet arthropathy and mild right facet arthropathy at C3-4 with bilateral mild foraminal narrowing. At C4-5 there is severe bilateral facet arthropathy with bilateral foraminal narrowing. At C5-6 there is severe bilateral facet arthropathy and uncovertebral degenerative changes resulting in bilateral mild foraminal narrowing. At C6-7 there is severe bilateral facet arthropathy and bilateral uncovertebral degenerative changes resulting in mild foraminal narrowing. At C7-T1 there is moderate bilateral facet arthropathy with mild bilateral foraminal narrowing. At T1-2 and T2-3 there is mild  bilateral facet arthropathy. Upper chest: Lung apices are clear. Other: Bilateral carotid artery atherosclerosis. IMPRESSION: 1. No acute intracranial pathology. 2. No acute osseous injury of the cervical spine. 3. Diffuse cervical spine spondylosis as described above. Electronically Signed   By: Kathreen Devoid   On: 11/25/2015 09:18   Ct Cervical Spine Wo Contrast  Result Date: 11/25/2015 CLINICAL DATA:  Status post fall last night. No loss consciousness. Mild headache. EXAM: CT HEAD WITHOUT CONTRAST CT CERVICAL SPINE WITHOUT CONTRAST TECHNIQUE: Multidetector CT imaging of the head and cervical spine was performed following the standard protocol without intravenous contrast. Multiplanar CT image reconstructions of the cervical spine were also generated. COMPARISON:  06/17/2015 FINDINGS: CT HEAD FINDINGS Brain: No evidence of acute infarction, hemorrhage, extra-axial collection, ventriculomegaly, or mass effect. Old lacunar infarct in the left thalamus. Generalized cerebral atrophy. Periventricular white matter low attenuation likely secondary to microangiopathy. Vascular: Cerebrovascular atherosclerotic calcifications are noted. Skull: Negative for fracture or focal lesion. Sinuses/Orbits: Visualized portions of the orbits are unremarkable. Visualized portions of the paranasal sinuses and mastoid air cells  are unremarkable. Other: None. CT CERVICAL SPINE FINDINGS Alignment: 2 mm of anterolisthesis of C4 on C5 and C5 on C6. 5 mm of anterolisthesis of C7 on T1. Anterolisthesis is secondary to facet arthropathy. Skull base and vertebrae: No acute fracture. No primary bone lesion. Erosive changes along the posterior aspect of the odontoid process with pannus formation as can be seen with an inflammatory arthropathy such as rheumatoid arthritis versus crystalline arthropathy. Soft tissues and spinal canal: No prevertebral fluid or swelling. No visible canal hematoma. Disc levels: Degenerative disc disease with disc  height loss at C3-4, C4-5, C5-6, C6-7 and C7-T1. Mild bilateral facet arthropathy at C2-3. Severe left facet arthropathy and mild right facet arthropathy at C3-4 with bilateral mild foraminal narrowing. At C4-5 there is severe bilateral facet arthropathy with bilateral foraminal narrowing. At C5-6 there is severe bilateral facet arthropathy and uncovertebral degenerative changes resulting in bilateral mild foraminal narrowing. At C6-7 there is severe bilateral facet arthropathy and bilateral uncovertebral degenerative changes resulting in mild foraminal narrowing. At C7-T1 there is moderate bilateral facet arthropathy with mild bilateral foraminal narrowing. At T1-2 and T2-3 there is mild bilateral facet arthropathy. Upper chest: Lung apices are clear. Other: Bilateral carotid artery atherosclerosis. IMPRESSION: 1. No acute intracranial pathology. 2. No acute osseous injury of the cervical spine. 3. Diffuse cervical spine spondylosis as described above. Electronically Signed   By: Kathreen Devoid   On: 11/25/2015 09:18   Dg Hip Unilat  With Pelvis 2-3 Views Right  Result Date: 11/25/2015 CLINICAL DATA:  Pain deformity of the right hip after a fall. EXAM: DG HIP (WITH OR WITHOUT PELVIS) 2-3V RIGHT COMPARISON:  None. FINDINGS: Acute comminuted inter trochanteric and sub trochanteric fractures of the right he up with displaced lesser trochanteric fragment and with mild varus angulation of the fracture fragments. No evidence of dislocation of the right hip. Previous left hip hemiarthroplasty. Pelvis appears intact. Calcified and tortuous iliac arteries. Vascular calcifications in the right leg. IMPRESSION: Acute comminuted inter trochanteric and sub trochanteric fractures of the right hip with displaced lesser trochanter fragment. Electronically Signed   By: Lucienne Capers M.D.   On: 11/25/2015 02:56    Positive ROS: All other systems have been reviewed and were otherwise negative with the exception of those  mentioned in the HPI and as above.  Physical Exam: General: somnolent, no acute distress Cardiovascular: mild pedal edema Respiratory: No cyanosis, no use of accessory musculature GI: No organomegaly, abdomen is soft and non-tender Skin: No lesions in the area of chief complaint Neurologic: Sensation intact distally Psychiatric: Patient is competent for consent with normal mood and affect Lymphatic: No axillary or cervical lymphadenopathy  MUSCULOSKELETAL: RLE- pain with log roll, leg is shortened and ER , skin is wwp, 2+ DP pulse  Assessment: Right hip IT fracture  Plan: -plan for operative intervention tomorrow am at 84 -ok for diet today -NPO at MN please -NWB RLE until surgery -SCDs -plan discussed with wife at bedside -The risks, benefits, and alternatives were discussed with the patient. There are risks associated with the surgery including, but not limited to, problems with anesthesia (death), infection, differences in leg length/angulation/rotation, fracture of bones, loosening or failure of implants, malunion, nonunion, hematoma (blood accumulation) which may require surgical drainage, blood clots, pulmonary embolism, nerve injury (foot drop), and blood vessel injury. The patient understands these risks and elects to proceed.     Nicholes Stairs, MD Cell [phone]    11/25/2015 12:44 PM
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
## 2015-11-26 ENCOUNTER — Inpatient Hospital Stay (HOSPITAL_COMMUNITY): Payer: Medicare Other | Admitting: Anesthesiology

## 2015-11-26 ENCOUNTER — Inpatient Hospital Stay (HOSPITAL_COMMUNITY): Payer: Medicare Other

## 2015-11-26 ENCOUNTER — Encounter (HOSPITAL_COMMUNITY)
Admission: EM | Disposition: A | Discharge status: Skilled Nursing Facility | Payer: Self-pay | Source: Home / Self Care | Attending: Internal Medicine

## 2015-11-26 DIAGNOSIS — S72141A Displaced intertrochanteric fracture of right femur, initial encounter for closed fracture: Secondary | ICD-10-CM | POA: Diagnosis present

## 2015-11-26 DIAGNOSIS — E43 Unspecified severe protein-calorie malnutrition: Secondary | ICD-10-CM | POA: Insufficient documentation

## 2015-11-26 HISTORY — PX: FEMUR IM NAIL: SHX1597

## 2015-11-26 LAB — BASIC METABOLIC PANEL
Anion gap: 5 (ref 5–15)
BUN: 15 mg/dL (ref 6–20)
CO2: 23 mmol/L (ref 22–32)
Calcium: 8.4 mg/dL — ABNORMAL LOW (ref 8.9–10.3)
Chloride: 106 mmol/L (ref 101–111)
Creatinine, Ser: 1.02 mg/dL (ref 0.61–1.24)
GFR calc Af Amer: >60 mL/min (ref >60)
GFR calc non Af Amer: >60 mL/min (ref >60)
Glucose, Bld: 116 mg/dL — ABNORMAL HIGH (ref 65–99)
Potassium: 3.9 mmol/L (ref 3.5–5.1)
Sodium: 134 mmol/L — ABNORMAL LOW (ref 135–145)

## 2015-11-26 LAB — GLUCOSE, CAPILLARY
Glucose-Capillary: 136 mg/dL — ABNORMAL HIGH (ref 65–99)
Glucose-Capillary: 148 mg/dL — ABNORMAL HIGH (ref 65–99)
Glucose-Capillary: 139 mg/dL — ABNORMAL HIGH (ref 65–99)
Glucose-Capillary: 160 mg/dL — ABNORMAL HIGH (ref 65–99)

## 2015-11-26 LAB — CBC
HCT: 30.3 % — ABNORMAL LOW (ref 39.0–52.0)
Hemoglobin: 9.9 g/dL — ABNORMAL LOW (ref 13.0–17.0)
MCH: 29.5 pg (ref 26.0–34.0)
MCHC: 32.7 g/dL (ref 30.0–36.0)
MCV: 90.2 fL (ref 78.0–100.0)
Platelets: 316 10*3/uL (ref 150–400)
RBC: 3.36 MIL/uL — ABNORMAL LOW (ref 4.22–5.81)
RDW: 16.5 % — ABNORMAL HIGH (ref 11.5–15.5)
WBC: 10.3 10*3/uL (ref 4.0–10.5)

## 2015-11-26 LAB — PREPARE RBC (CROSSMATCH)

## 2015-11-26 SURGERY — INSERTION, INTRAMEDULLARY ROD, FEMUR
Anesthesia: General | Site: Hip | Laterality: Right

## 2015-11-26 MED ORDER — LIDOCAINE 2% (20 MG/ML) 5 ML SYRINGE
INTRAMUSCULAR | Status: AC
  Filled 2015-11-26: qty 5

## 2015-11-26 MED ORDER — ENOXAPARIN SODIUM 40 MG/0.4ML ~~LOC~~ SOLN
40.0000 mg | SUBCUTANEOUS | Status: DC
  Administered 2015-11-27 – 2015-11-29 (×3): 40 mg via SUBCUTANEOUS
  Filled 2015-11-26 (×3): qty 0.4

## 2015-11-26 MED ORDER — LIDOCAINE 2% (20 MG/ML) 5 ML SYRINGE
INTRAMUSCULAR | Status: DC | PRN
Start: 2015-11-26 — End: 2015-11-26
  Administered 2015-11-26: 100 mg via INTRAVENOUS

## 2015-11-26 MED ORDER — ROCURONIUM BROMIDE 10 MG/ML (PF) SYRINGE
PREFILLED_SYRINGE | INTRAVENOUS | Status: AC
  Filled 2015-11-26: qty 10

## 2015-11-26 MED ORDER — CEFAZOLIN SODIUM-DEXTROSE 2-4 GM/100ML-% IV SOLN
INTRAVENOUS | Status: AC
  Filled 2015-11-26: qty 100

## 2015-11-26 MED ORDER — MENTHOL 3 MG MT LOZG
1.0000 | LOZENGE | OROMUCOSAL | Status: DC | PRN
  Filled 2015-11-26: qty 9

## 2015-11-26 MED ORDER — METHOCARBAMOL 1000 MG/10ML IJ SOLN
500.0000 mg | Freq: Four times a day (QID) | INTRAVENOUS | Status: DC | PRN
  Filled 2015-11-26: qty 5

## 2015-11-26 MED ORDER — FENTANYL CITRATE (PF) 100 MCG/2ML IJ SOLN
INTRAMUSCULAR | Status: DC | PRN
  Administered 2015-11-26 (×4): 50 ug via INTRAVENOUS

## 2015-11-26 MED ORDER — ACETAMINOPHEN 10 MG/ML IV SOLN: 1000.0000 mg | Freq: Once | INTRAVENOUS | Status: DC

## 2015-11-26 MED ORDER — FENTANYL CITRATE (PF) 100 MCG/2ML IJ SOLN: 25.0000 ug | INTRAMUSCULAR | Status: DC | PRN

## 2015-11-26 MED ORDER — ONDANSETRON HCL 4 MG PO TABS: 4.0000 mg | ORAL_TABLET | Freq: Four times a day (QID) | ORAL | Status: DC | PRN

## 2015-11-26 MED ORDER — SENNA 8.6 MG PO TABS
1.0000 | ORAL_TABLET | Freq: Two times a day (BID) | ORAL | Status: DC
  Administered 2015-11-26 – 2015-11-30 (×8): 8.6 mg via ORAL
  Filled 2015-11-26 (×8): qty 1

## 2015-11-26 MED ORDER — PROPOFOL 10 MG/ML IV BOLUS
INTRAVENOUS | Status: AC
  Filled 2015-11-26: qty 20

## 2015-11-26 MED ORDER — ALBUMIN HUMAN 5 % IV SOLN
INTRAVENOUS | Status: DC | PRN
  Administered 2015-11-26: 09:00:00 via INTRAVENOUS

## 2015-11-26 MED ORDER — CEFAZOLIN SODIUM-DEXTROSE 2-3 GM-% IV SOLR
INTRAVENOUS | Status: DC | PRN
  Administered 2015-11-26: 2 g via INTRAVENOUS

## 2015-11-26 MED ORDER — FENTANYL CITRATE (PF) 100 MCG/2ML IJ SOLN
INTRAMUSCULAR | Status: AC
  Filled 2015-11-26: qty 2

## 2015-11-26 MED ORDER — ONDANSETRON HCL 4 MG/2ML IJ SOLN
INTRAMUSCULAR | Status: DC | PRN
  Administered 2015-11-26: 4 mg via INTRAVENOUS

## 2015-11-26 MED ORDER — METHOCARBAMOL 500 MG PO TABS: 500.0000 mg | ORAL_TABLET | Freq: Four times a day (QID) | ORAL | Status: DC | PRN

## 2015-11-26 MED ORDER — ACETAMINOPHEN 500 MG PO TABS
1000.0000 mg | ORAL_TABLET | Freq: Four times a day (QID) | ORAL | Status: AC
Start: 2015-11-26 — End: 2015-11-27
  Administered 2015-11-26 – 2015-11-27 (×3): 1000 mg via ORAL
  Filled 2015-11-26 (×3): qty 2

## 2015-11-26 MED ORDER — ACETAMINOPHEN 325 MG PO TABS
650.0000 mg | ORAL_TABLET | Freq: Four times a day (QID) | ORAL | Status: DC | PRN
  Administered 2015-11-29 – 2015-11-30 (×2): 650 mg via ORAL
  Filled 2015-11-26 (×2): qty 2

## 2015-11-26 MED ORDER — DOCUSATE SODIUM 100 MG PO CAPS
100.0000 mg | ORAL_CAPSULE | Freq: Two times a day (BID) | ORAL | Status: DC
  Administered 2015-11-26 – 2015-11-30 (×8): 100 mg via ORAL
  Filled 2015-11-26 (×8): qty 1

## 2015-11-26 MED ORDER — SODIUM CHLORIDE 0.9 % IR SOLN
Status: DC | PRN
  Administered 2015-11-26: 1000 mL

## 2015-11-26 MED ORDER — ACETAMINOPHEN 650 MG RE SUPP: 650.0000 mg | Freq: Four times a day (QID) | RECTAL | Status: DC | PRN

## 2015-11-26 MED ORDER — SUGAMMADEX SODIUM 200 MG/2ML IV SOLN
INTRAVENOUS | Status: AC
  Filled 2015-11-26: qty 2

## 2015-11-26 MED ORDER — ACETAMINOPHEN 10 MG/ML IV SOLN
INTRAVENOUS | Status: AC
Start: 2015-11-26 — End: 2015-11-26
  Filled 2015-11-26: qty 100

## 2015-11-26 MED ORDER — ONDANSETRON HCL 4 MG/2ML IJ SOLN: 4.0000 mg | Freq: Four times a day (QID) | INTRAMUSCULAR | Status: DC | PRN

## 2015-11-26 MED ORDER — METOCLOPRAMIDE HCL 5 MG/ML IJ SOLN: 5.0000 mg | Freq: Three times a day (TID) | INTRAMUSCULAR | Status: DC | PRN

## 2015-11-26 MED ORDER — EPHEDRINE SULFATE 50 MG/ML IJ SOLN
INTRAMUSCULAR | Status: DC | PRN
  Administered 2015-11-26: 10 mg via INTRAVENOUS

## 2015-11-26 MED ORDER — PHENOL 1.4 % MT LIQD: 1.0000 | OROMUCOSAL | Status: DC | PRN

## 2015-11-26 MED ORDER — HYDROCODONE-ACETAMINOPHEN 5-325 MG PO TABS
1.0000 | ORAL_TABLET | Freq: Four times a day (QID) | ORAL | Status: DC | PRN
  Administered 2015-11-28: 1 via ORAL
  Filled 2015-11-26: qty 1

## 2015-11-26 MED ORDER — SUCCINYLCHOLINE CHLORIDE 200 MG/10ML IV SOSY
PREFILLED_SYRINGE | INTRAVENOUS | Status: DC | PRN
  Administered 2015-11-26: 100 mg via INTRAVENOUS

## 2015-11-26 MED ORDER — ROCURONIUM BROMIDE 10 MG/ML (PF) SYRINGE
PREFILLED_SYRINGE | INTRAVENOUS | Status: DC | PRN
  Administered 2015-11-26: 30 mg via INTRAVENOUS

## 2015-11-26 MED ORDER — ONDANSETRON HCL 4 MG/2ML IJ SOLN
INTRAMUSCULAR | Status: AC
  Filled 2015-11-26: qty 2

## 2015-11-26 MED ORDER — EPHEDRINE 5 MG/ML INJ
INTRAVENOUS | Status: AC
  Filled 2015-11-26: qty 10

## 2015-11-26 MED ORDER — METOCLOPRAMIDE HCL 5 MG PO TABS: 5.0000 mg | ORAL_TABLET | Freq: Three times a day (TID) | ORAL | Status: DC | PRN

## 2015-11-26 MED ORDER — ISOPROPYL ALCOHOL 70 % SOLN
Status: DC | PRN
  Administered 2015-11-26: 1 via TOPICAL

## 2015-11-26 MED ORDER — LACTATED RINGERS IV SOLN
INTRAVENOUS | Status: DC | PRN
  Administered 2015-11-26 (×2): via INTRAVENOUS

## 2015-11-26 MED ORDER — SUGAMMADEX SODIUM 200 MG/2ML IV SOLN
INTRAVENOUS | Status: DC | PRN
  Administered 2015-11-26: 20 mg via INTRAVENOUS

## 2015-11-26 MED ORDER — SODIUM CHLORIDE 0.9 % IV SOLN
INTRAVENOUS | Status: AC
  Administered 2015-11-26: 15:00:00 via INTRAVENOUS

## 2015-11-26 MED ORDER — PROPOFOL 10 MG/ML IV BOLUS
INTRAVENOUS | Status: DC | PRN
  Administered 2015-11-26: 100 mg via INTRAVENOUS

## 2015-11-26 MED ORDER — ISOPROPYL ALCOHOL 70 % SOLN
Status: AC
  Filled 2015-11-26: qty 480

## 2015-11-26 SURGICAL SUPPLY — 35 items
BAG ZIPLOCK 12X15 (MISCELLANEOUS) ×3 IMPLANT
BIT DRILL 4.2 (DRILL) ×1 IMPLANT
CHLORAPREP W/TINT 26ML (MISCELLANEOUS) ×3 IMPLANT
COVER PERINEAL POST (MISCELLANEOUS) ×3 IMPLANT
DRAPE C-ARM 42X120 X-RAY (DRAPES) ×3 IMPLANT
DRAPE C-ARMOR (DRAPES) ×3 IMPLANT
DRAPE ORTHO SPLIT 77X108 STRL (DRAPES) ×4
DRAPE STERI IOBAN 125X83 (DRAPES) ×3 IMPLANT
DRAPE SURG ORHT 6 SPLT 77X108 (DRAPES) ×2 IMPLANT
DRAPE U-SHAPE 47X51 STRL (DRAPES) ×6 IMPLANT
DRILL 4.2 (DRILL) ×3
DRSG MEPILEX BORDER 4X4 (GAUZE/BANDAGES/DRESSINGS) ×6 IMPLANT
DRSG MEPILEX BORDER 4X8 (GAUZE/BANDAGES/DRESSINGS) ×3 IMPLANT
ELECT BLADE TIP CTD 4 INCH (ELECTRODE) ×3 IMPLANT
GAUZE SPONGE 4X4 12PLY STRL (GAUZE/BANDAGES/DRESSINGS) ×3 IMPLANT
GLOVE BIO SURGEON STRL SZ8.5 (GLOVE) ×6 IMPLANT
GLOVE BIOGEL PI IND STRL 8.5 (GLOVE) ×1 IMPLANT
GLOVE BIOGEL PI INDICATOR 8.5 (GLOVE) ×2
GOWN SPEC L3 XXLG W/TWL (GOWN DISPOSABLE) ×6 IMPLANT
GUIDEWIRE 3.2X400 (WIRE) ×6 IMPLANT
KIT BASIN OR (CUSTOM PROCEDURE TRAY) ×3 IMPLANT
LIQUID BAND (GAUZE/BANDAGES/DRESSINGS) ×6 IMPLANT
MANIFOLD NEPTUNE II (INSTRUMENTS) ×3 IMPLANT
MARKER SKIN DUAL TIP RULER LAB (MISCELLANEOUS) ×3 IMPLANT
NAIL TROCH FIX LNG 11X400RT (Nail) ×3 IMPLANT
PACK TOTAL JOINT (CUSTOM PROCEDURE TRAY) ×3 IMPLANT
REAMER ROD DEEP FLUTE 2.5X950 (INSTRUMENTS) ×3 IMPLANT
SCREW CANN LOCK TI FT 5X42 (Screw) ×3 IMPLANT
SCREW TNFA 110MM HIP (Screw) ×3 IMPLANT
SUT MNCRL AB 3-0 PS2 18 (SUTURE) ×6 IMPLANT
SUT VIC AB 1 CT1 27 (SUTURE) ×4
SUT VIC AB 1 CT1 27XBRD ANTBC (SUTURE) ×2 IMPLANT
SUT VIC AB 2-0 CT1 27 (SUTURE) ×4
SUT VIC AB 2-0 CT1 27XBRD (SUTURE) ×2 IMPLANT
YANKAUER SUCT BULB TIP NO VENT (SUCTIONS) ×3 IMPLANT

## 2015-11-26 NOTE — Op Note (Signed)
NAMEMarland Kitchen  HA, KRENGEL NO.:  [id number]  MEDICAL RECORD NO.:  [id number]  LOCATION:  [facility]                         FACILITY:  [facility]  PHYSICIAN:  Rod Can, MD     DATE OF BIRTH:  [birth year]  DATE OF PROCEDURE:  11/26/2015 DATE OF DISCHARGE:                              OPERATIVE REPORT   SURGEON:  Rod Can, MD.  ASSISTANT:  None.  PREOPERATIVE DIAGNOSIS:  Comminuted right pertrochanteric femur fracture.  POSTOPERATIVE DIAGNOSIS:  Comminuted right pertrochanteric femur fracture.  PROCEDURE PERFORMED:  Intramedullary fixation of right intertrochanteric femur fracture.  IMPLANTS: 1. Synthes TFNA nail size 11 x 400 mm, 130 degrees. 2. 110 mm lag screw. 3. 5.0 mm distal interlocking screw x1.  ANESTHESIA:  General.  ANTIBIOTICS:  2 g Ancef.  COMPLICATIONS:  None.  SPECIMENS:  None.  TUBES AND DRAINS:  None.  DISPOSITION:  Stable to PACU.  INDICATIONS:  The patient is an 82 year old male, who had a recent stroke with right-sided weakness.  He was in rehab for some time.  He had a ground level fall while transferring from his wheelchair and had hip pain and inability to weight bear.  He was brought to the emergency department, where x-rays revealed a comminuted right pertrochanteric femur fracture.  He was admitted to the hospitalist service, underwent perioperative risk stratification, medical optimization.  Risks, benefits, and alternatives of surgical fixation were explained, he elected to proceed.  DESCRIPTION OF PROCEDURE IN DETAIL:  I identified the patient in the holding area using 2 identifiers.  The surgical site was marked by myself.  He was taken to the operating room and general anesthesia was induced on his bed.  He was then transferred to the [facility] table.  The right lower extremity was placed in traction, internal rotation and adduction.  He had an extremely comminuted right pertrochanteric femur fracture with a reverse  obliquity component.  The right hip was prepped and draped in a normal sterile surgical fashion.  Time-out was called verifying side and site of surgery.  I began by using fluoroscopy to define his anatomy.  I made a 4 cm incision proximal to the tip of the trochanter.  I used the awl to determine the standard starting point for trochanteric entry nail.  I placed a guide pin under live fluoroscopic control.  I then used the entry reamer.  I reamed up to a 12.5 reamer with good chatter.  Therefore, I selected 11 mm nail.  The real nail was assembled on the back table and it was obvious that his fracture was extremely comminuted.  The medial cortex had some malalignment.  I made another incision over the level of the greater trochanter and I inserted a bone hook which allowed me to reduce the fracture better.  The real nail was then impacted into place to the appropriate depth.  I made a separate stab incision, inserted the cannula down to the bone for the cephalomedullary device.  I used a guide pin to place center-center.  I then measured, reamed, placed the real lag screw.  I tightened the set screw.  I locked it in place.  Due to the subtrochanteric extension and  reverse obliquity pattern and as I did not want the typical Z type deformity that can occur.  I removed the jig.  I then turned my attention distally.  Using perfect circle technique, I placed a single distal interlocking screw.  The wounds were then copiously irrigated with saline.  Final fluoroscopy views were used to confirm acceptable reduction, restoration of neck shaft angle in center-center position. Tip apex distance was appropriate.  The wounds were irrigated with saline.  I closed the wounds in layers with #1 Vicryl for the fascia, 2- 0 Monocryl for the deep dermal layer, running 3-0 Monocryl subcuticular stitch.  Glue was applied.  Once the glue was hardened, sterile dressings were applied.  Sponge, needle, and  instrument counts were correct at the end of the case x2.  There were no known complications. He was then extubated and taken to PACU in stable condition.  Postoperatively, we will readmit the patient to the hospitalist.  He will be touchdown weightbearing in the right lower extremity.  He will have physical therapy for bed-to-chair transfers.  We will put him on Lovenox for DVT prophylaxis.  I will see him back 2 weeks after discharge.  All questions solicited and answered.          ______________________________ Rod Can, MD     BS/MEDQ  D:  11/26/2015  T:  11/26/2015  Job:  [id number]

## 2015-11-26 NOTE — Brief Op Note (Signed)
11/26/2015  9:15 AM  PATIENT:  Herbert Marquez  82 y.o. male  PRE-OPERATIVE DIAGNOSIS:  fractured right hip  POST-OPERATIVE DIAGNOSIS:  fractured right hip  PROCEDURE:  Procedure(s): INTRAMEDULLARY RIGHT (IM) NAIL FEMORAL (Right)  SURGEON:  Surgeon(s) and Role:    * Rod Can, MD - Primary  PHYSICIAN ASSISTANT: N/A  ASSISTANTS: none   ANESTHESIA:   general  EBL:  Total I/O In: 1250 [I.V.:1000; IV Piggyback:250] Out: 150 [Blood:150]  BLOOD ADMINISTERED:none  DRAINS: none   LOCAL MEDICATIONS USED:  NONE  SPECIMEN:  No Specimen  DISPOSITION OF SPECIMEN:  N/A  COUNTS:  YES  TOURNIQUET:  * No tourniquets in log *  DICTATION: .Other Dictation: Dictation Number not provided by dictation system  PLAN OF CARE: Admit to inpatient   PATIENT DISPOSITION:  PACU - hemodynamically stable.   Delay start of Pharmacological VTE agent (>24hrs) due to surgical blood loss or risk of bleeding: yes

## 2015-11-26 NOTE — H&P (View-Only) (Signed)
ORTHOPAEDIC CONSULTATION  REQUESTING PHYSICIAN: Theodis Blaze, MD  PCP:  Thressa Sheller, MD  Chief Complaint: Right hip pain  HPI: Herbert Marquez is a 82 y.o. male who complains of  Right hip pain following a fall from his wheelchair yesterday afternoon.  This was a witnessed fall by his wife.  He had immediate pain and was taken to the ED for evaluation and found to have comminuted right hip IT fracture.  He has a hx of right sided stroke and is left with some sensory deficit and weakness of the RLE, but does ambulate in his home at baseline with wheelchair for out of home mobility.  Past Medical History:  Diagnosis Date  . Arthritis    "pretty much all over"   . Basal cell carcinoma    "several burned off his body, face, head"  . BPH (benign prostatic hypertrophy)   . Coronary artery disease    a. s/p PCI of RCA in 2006  . CVA (cerebral infarction)    a. 06/2015: left thalamic and bilateral PCA  . GERD (gastroesophageal reflux disease)   . Hyperlipidemia   . Hypertension   . TIA (transient ischemic attack)    Approximately 6 weeks post-cardiac catheterization.   . Type II diabetes mellitus ([facility])    "prediabetic; lost alot of weight; not diabetic now" (06/16/2015)   Past Surgical History:  Procedure Laterality Date  . CARDIOVASCULAR STRESS TEST  07/01/2007   EF 74%  . CATARACT EXTRACTION, BILATERAL    . CORONARY ANGIOPLASTY WITH STENT PLACEMENT  10/2004   stenting x 2 to RCA  . HERNIA REPAIR    . HIP ARTHROPLASTY  03/09/2011   Procedure: ARTHROPLASTY BIPOLAR HIP;  Surgeon: Mauri Pole;  Location: [facility];  Service: Orthopedics;  Laterality: Left;  . LAPAROSCOPIC INCISIONAL / UMBILICAL / VENTRAL HERNIA REPAIR     "below his naval"   Social History   Social History  . Marital status: Married    Spouse name: N/A  . Number of children: N/A  . Years of education: N/A   Occupational History  . Retired Other   Social History Main Topics  . Smoking status: Former  Smoker    Quit date: 03/06/1971  . Smokeless tobacco: Never Used  . Alcohol use 1.2 oz/week    1 Glasses of wine, 1 Cans of beer per week     Comment: 1/2 BEER AND 1 WINE  . Drug use: No  . Sexual activity: No   Other Topics Concern  . None   Social History Narrative   Lives in [place], [place] with wife. Has 3 children.    Family History  Problem Relation Age of Onset  . Hypertension Mother   . Lung cancer Father   . Lung cancer Brother    No Known Allergies Prior to Admission medications   Medication Sig Start Date End Date Taking? Authorizing Provider  acetaminophen (TYLENOL) 500 MG tablet Take 1,000 mg by mouth every 6 (six) hours as needed for mild pain.   Yes Historical Provider, MD  atorvastatin (LIPITOR) 80 MG tablet Take 1 tablet (80 mg total) by mouth daily. 07/08/15  Yes Daniel J Angiulli, PA-C  clopidogrel (PLAVIX) 75 MG tablet Take 1 tablet (75 mg total) by mouth daily. 07/08/15  Yes Daniel J Angiulli, PA-C  metoprolol tartrate (LOPRESSOR) 25 MG tablet Take 0.5 tablets (12.5 mg total) by mouth 2 (two) times daily. 07/08/15  Yes Daniel J Angiulli, PA-C  Multiple Vitamin (  MULTIVITAMIN) tablet Take 1 tablet by mouth daily.    Yes Historical Provider, MD  nitroGLYCERIN (NITROSTAT) 0.4 MG SL tablet Place 0.4 mg under the tongue every 5 (five) minutes as needed for chest pain.   Yes Historical Provider, MD  pioglitazone (ACTOS) 30 MG tablet Take 0.5 tablets (15 mg total) by mouth daily. 07/08/15  Yes Daniel J Angiulli, PA-C  tamsulosin (FLOMAX) 0.4 MG CAPS capsule Take 0.4 mg by mouth every other day.    Yes Historical Provider, MD   Ct Head Wo Contrast  Result Date: 11/25/2015 CLINICAL DATA:  Status post fall last night. No loss consciousness. Mild headache. EXAM: CT HEAD WITHOUT CONTRAST CT CERVICAL SPINE WITHOUT CONTRAST TECHNIQUE: Multidetector CT imaging of the head and cervical spine was performed following the standard protocol without intravenous contrast. Multiplanar CT  image reconstructions of the cervical spine were also generated. COMPARISON:  06/17/2015 FINDINGS: CT HEAD FINDINGS Brain: No evidence of acute infarction, hemorrhage, extra-axial collection, ventriculomegaly, or mass effect. Old lacunar infarct in the left thalamus. Generalized cerebral atrophy. Periventricular white matter low attenuation likely secondary to microangiopathy. Vascular: Cerebrovascular atherosclerotic calcifications are noted. Skull: Negative for fracture or focal lesion. Sinuses/Orbits: Visualized portions of the orbits are unremarkable. Visualized portions of the paranasal sinuses and mastoid air cells are unremarkable. Other: None. CT CERVICAL SPINE FINDINGS Alignment: 2 mm of anterolisthesis of C4 on C5 and C5 on C6. 5 mm of anterolisthesis of C7 on T1. Anterolisthesis is secondary to facet arthropathy. Skull base and vertebrae: No acute fracture. No primary bone lesion. Erosive changes along the posterior aspect of the odontoid process with pannus formation as can be seen with an inflammatory arthropathy such as rheumatoid arthritis versus crystalline arthropathy. Soft tissues and spinal canal: No prevertebral fluid or swelling. No visible canal hematoma. Disc levels: Degenerative disc disease with disc height loss at C3-4, C4-5, C5-6, C6-7 and C7-T1. Mild bilateral facet arthropathy at C2-3. Severe left facet arthropathy and mild right facet arthropathy at C3-4 with bilateral mild foraminal narrowing. At C4-5 there is severe bilateral facet arthropathy with bilateral foraminal narrowing. At C5-6 there is severe bilateral facet arthropathy and uncovertebral degenerative changes resulting in bilateral mild foraminal narrowing. At C6-7 there is severe bilateral facet arthropathy and bilateral uncovertebral degenerative changes resulting in mild foraminal narrowing. At C7-T1 there is moderate bilateral facet arthropathy with mild bilateral foraminal narrowing. At T1-2 and T2-3 there is mild  bilateral facet arthropathy. Upper chest: Lung apices are clear. Other: Bilateral carotid artery atherosclerosis. IMPRESSION: 1. No acute intracranial pathology. 2. No acute osseous injury of the cervical spine. 3. Diffuse cervical spine spondylosis as described above. Electronically Signed   By: Kathreen Devoid   On: 11/25/2015 09:18   Ct Cervical Spine Wo Contrast  Result Date: 11/25/2015 CLINICAL DATA:  Status post fall last night. No loss consciousness. Mild headache. EXAM: CT HEAD WITHOUT CONTRAST CT CERVICAL SPINE WITHOUT CONTRAST TECHNIQUE: Multidetector CT imaging of the head and cervical spine was performed following the standard protocol without intravenous contrast. Multiplanar CT image reconstructions of the cervical spine were also generated. COMPARISON:  06/17/2015 FINDINGS: CT HEAD FINDINGS Brain: No evidence of acute infarction, hemorrhage, extra-axial collection, ventriculomegaly, or mass effect. Old lacunar infarct in the left thalamus. Generalized cerebral atrophy. Periventricular white matter low attenuation likely secondary to microangiopathy. Vascular: Cerebrovascular atherosclerotic calcifications are noted. Skull: Negative for fracture or focal lesion. Sinuses/Orbits: Visualized portions of the orbits are unremarkable. Visualized portions of the paranasal sinuses and mastoid air cells  are unremarkable. Other: None. CT CERVICAL SPINE FINDINGS Alignment: 2 mm of anterolisthesis of C4 on C5 and C5 on C6. 5 mm of anterolisthesis of C7 on T1. Anterolisthesis is secondary to facet arthropathy. Skull base and vertebrae: No acute fracture. No primary bone lesion. Erosive changes along the posterior aspect of the odontoid process with pannus formation as can be seen with an inflammatory arthropathy such as rheumatoid arthritis versus crystalline arthropathy. Soft tissues and spinal canal: No prevertebral fluid or swelling. No visible canal hematoma. Disc levels: Degenerative disc disease with disc  height loss at C3-4, C4-5, C5-6, C6-7 and C7-T1. Mild bilateral facet arthropathy at C2-3. Severe left facet arthropathy and mild right facet arthropathy at C3-4 with bilateral mild foraminal narrowing. At C4-5 there is severe bilateral facet arthropathy with bilateral foraminal narrowing. At C5-6 there is severe bilateral facet arthropathy and uncovertebral degenerative changes resulting in bilateral mild foraminal narrowing. At C6-7 there is severe bilateral facet arthropathy and bilateral uncovertebral degenerative changes resulting in mild foraminal narrowing. At C7-T1 there is moderate bilateral facet arthropathy with mild bilateral foraminal narrowing. At T1-2 and T2-3 there is mild bilateral facet arthropathy. Upper chest: Lung apices are clear. Other: Bilateral carotid artery atherosclerosis. IMPRESSION: 1. No acute intracranial pathology. 2. No acute osseous injury of the cervical spine. 3. Diffuse cervical spine spondylosis as described above. Electronically Signed   By: Kathreen Devoid   On: 11/25/2015 09:18   Dg Hip Unilat  With Pelvis 2-3 Views Right  Result Date: 11/25/2015 CLINICAL DATA:  Pain deformity of the right hip after a fall. EXAM: DG HIP (WITH OR WITHOUT PELVIS) 2-3V RIGHT COMPARISON:  None. FINDINGS: Acute comminuted inter trochanteric and sub trochanteric fractures of the right he up with displaced lesser trochanteric fragment and with mild varus angulation of the fracture fragments. No evidence of dislocation of the right hip. Previous left hip hemiarthroplasty. Pelvis appears intact. Calcified and tortuous iliac arteries. Vascular calcifications in the right leg. IMPRESSION: Acute comminuted inter trochanteric and sub trochanteric fractures of the right hip with displaced lesser trochanter fragment. Electronically Signed   By: Lucienne Capers M.D.   On: 11/25/2015 02:56    Positive ROS: All other systems have been reviewed and were otherwise negative with the exception of those  mentioned in the HPI and as above.  Physical Exam: General: somnolent, no acute distress Cardiovascular: mild pedal edema Respiratory: No cyanosis, no use of accessory musculature GI: No organomegaly, abdomen is soft and non-tender Skin: No lesions in the area of chief complaint Neurologic: Sensation intact distally Psychiatric: Patient is competent for consent with normal mood and affect Lymphatic: No axillary or cervical lymphadenopathy  MUSCULOSKELETAL: RLE- pain with log roll, leg is shortened and ER , skin is wwp, 2+ DP pulse  Assessment: Right hip IT fracture  Plan: -plan for operative intervention tomorrow am at 56 -ok for diet today -NPO at MN please -NWB RLE until surgery -SCDs -plan discussed with wife at bedside -The risks, benefits, and alternatives were discussed with the patient. There are risks associated with the surgery including, but not limited to, problems with anesthesia (death), infection, differences in leg length/angulation/rotation, fracture of bones, loosening or failure of implants, malunion, nonunion, hematoma (blood accumulation) which may require surgical drainage, blood clots, pulmonary embolism, nerve injury (foot drop), and blood vessel injury. The patient understands these risks and elects to proceed.     Nicholes Stairs, MD Cell [phone]    11/25/2015 12:44 PM

## 2015-11-26 NOTE — Anesthesia Procedure Notes (Signed)
Procedure Name: Intubation Date/Time: 11/26/2015 7:35 AM Performed by: Lind Covert Pre-anesthesia Checklist: Patient identified, Timeout performed, Emergency Drugs available, Suction available and Patient being monitored Patient Re-evaluated:Patient Re-evaluated prior to inductionOxygen Delivery Method: Circle system utilized Preoxygenation: Pre-oxygenation with 100% oxygen Intubation Type: IV induction Laryngoscope Size: Mac and 4 Grade View: Grade I Tube type: Oral Tube size: 7.5 mm Number of attempts: 1 Airway Equipment and Method: Stylet Placement Confirmation: ETT inserted through vocal cords under direct vision,  positive ETCO2 and breath sounds checked- equal and bilateral Secured at: 23 cm Tube secured with: Tape Dental Injury: Teeth and Oropharynx as per pre-operative assessment

## 2015-11-26 NOTE — Transfer of Care (Signed)
Immediate Anesthesia Transfer of Care Note  Patient: Herbert Marquez  Procedure(s) Performed: Procedure(s): INTRAMEDULLARY RIGHT (IM) NAIL FEMORAL (Right)  Patient Location: PACU  Anesthesia Type:General  Level of Consciousness: sedated  Airway & Oxygen Therapy: Patient Spontanous Breathing and Patient connected to face mask oxygen  Post-op Assessment: Report given to RN and Post -op Vital signs reviewed and stable  Post vital signs: Reviewed and stable  Last Vitals:  Vitals:   11/25/15 2126 11/26/15 0600  BP: (!) 109/51 (!) 137/51  Pulse: 65 64  Resp: 18 18  Temp: 36.7 C 36.9 C    Last Pain:  Vitals:   11/26/15 0600  TempSrc: Oral  PainSc:       Patients Stated Pain Goal: 2 ([id number])  Complications: No apparent anesthesia complications

## 2015-11-26 NOTE — Interval H&P Note (Signed)
History and Physical Interval Note:  11/26/2015 7:30 AM  Herbert Marquez  has presented today for surgery, with the diagnosis of fractured right hip  The various methods of treatment have been discussed with the patient and family. After consideration of risks, benefits and other options for treatment, the patient has consented to  Procedure(s): INTRAMEDULLARY RIGHT (IM) NAIL FEMORAL (Right) as a surgical intervention .  The patient's history has been reviewed, patient examined, no change in status, stable for surgery.  I have reviewed the patient's chart and labs.  Questions were answered to the patient's satisfaction.    Dr. Stann Mainland asked me to assume care.  The risks, benefits, and alternatives were discussed with the patient. There are risks associated with the surgery including, but not limited to, problems with anesthesia (death), infection, differences in leg length/angulation/rotation, fracture of bones, loosening or failure of implants, malunion, nonunion, hematoma (blood accumulation) which may require surgical drainage, blood clots, pulmonary embolism, nerve injury (foot drop), and blood vessel injury. The patient understands these risks and elects to proceed.    Peace Noyes, Horald Pollen

## 2015-11-26 NOTE — Anesthesia Postprocedure Evaluation (Signed)
Anesthesia Post Note  Patient: Herbert Marquez  Procedure(s) Performed: Procedure(s) (LRB): INTRAMEDULLARY RIGHT (IM) NAIL FEMORAL (Right)  Patient location during evaluation: PACU Anesthesia Type: General Level of consciousness: awake Pain management: pain level controlled Respiratory status: spontaneous breathing Cardiovascular status: stable Anesthetic complications: no    Last Vitals:  Vitals:   11/26/15 0930 11/26/15 0945  BP: (!) 91/50 (!) 93/58  Pulse: 71 65  Resp: (!) 21 (!) 21  Temp:      Last Pain:  Vitals:   11/26/15 0927  TempSrc:   PainSc: Asleep                 EDWARDS,Edilberto Roosevelt

## 2015-11-26 NOTE — Anesthesia Preprocedure Evaluation (Addendum)
Anesthesia Evaluation  Patient identified by MRN, date of birth, ID band Patient awake    Airway Mallampati: II  TM Distance: >3 FB     Dental   Pulmonary COPD, former smoker,     + decreased breath sounds      Cardiovascular hypertension, + CAD   Rhythm:Regular Rate:Normal     Neuro/Psych    GI/Hepatic Neg liver ROS, GERD  ,  Endo/Other  diabetes  Renal/GU Renal disease     Musculoskeletal   Abdominal   Peds  Hematology   Anesthesia Other Findings   Reproductive/Obstetrics                             Anesthesia Physical Anesthesia Plan  ASA: III  Anesthesia Plan: General   Post-op Pain Management:    Induction: Intravenous  Airway Management Planned: Oral ETT  Additional Equipment:   Intra-op Plan:   Post-operative Plan: Possible Post-op intubation/ventilation  Informed Consent:   Dental advisory given  Plan Discussed with: CRNA and Anesthesiologist  Anesthesia Plan Comments:        Anesthesia Quick Evaluation

## 2015-11-26 NOTE — Progress Notes (Signed)
[facility] PROGRESS NOTE  Herbert Marquez [id number] DOB: Mar 01, 1928 DOA: 11/24/2015 PCP: Thressa Sheller, MD  Principal Problem:   Closed right hip fracture ([place]) Active Problems:   HLD (hyperlipidemia)   Benign essential HTN   CAD in native artery   H/O: CVA (cerebrovascular accident)   Controlled diabetes mellitus type 2 with complications (HCC)   BPH (benign prostatic hyperplasia)   CKD (chronic kidney disease), stage II   Displaced intertrochanteric fracture of right femur, initial encounter for closed fracture (HCC)   Protein-calorie malnutrition, severe  Brief summary   82 y.o. male with medical history significant of hypertension, hyperlipidemia, diabetes mellitus, GERD, TIA, stroke with mild right-sided weakness, CAD, s/p of stent placement, BPH, arthritis, CKD-II, who presents with right hip pain after fall. Patient states that he fell when he was in wheelchair and tried to reach some thing last night. He injured his right hip, causing severe pain.   ED Course: . X-ray of her right hip showed acute comminuted inter trochanteric and sub trochanteric fractures of the right hip with displaced lesser trochanter fragment. Pt is admitted to telemetry bed as inpatient. Orthopedic surgeon, Dr. Stann Mainland was consulted.  Assessment/Plan:  Closed right hip fracture:  As evidenced by x-ray. Patient has moderate pain now. No neurovascular compromise.  -patient underwent IM nailing on 9/23. Obtain pt/ot eval. Cont pain control   HLD: Last LDL was 53 on 06/17/15. Continue home medications: Lipitor  Benign essential HTN: Continue metoprolol. IV hydralazine when necessary   CAD in native artery: s/p of stent. No CP. Continue metoprolol, Lipitor -resume Plavix in AM  H/O: CVA (cerebrovascular accident): Has mild right-sided weakness. No acute new issues. Continue Lipitor. Resume Plavix   DM-II: Last A1c 5.6 on 06/17/15, well controled. Patient is taking Actos at home. Cont  SSI  BPH: stable.  Continue Flomax  CKD (chronic kidney disease), stage II: Slightly worsening renal function. Baseline Crfe 1.1-1.2, his creatinine is 1.30, BUN 24, likely due to dehydration. Received gentle IVF   Code Status: full Family Communication: d/w patient, his son (indicate person spoken with, relationship, and if by phone, the number) Disposition Plan: pend PT    Consultants:  Orthopedics   Procedures:  IM nail   Antibiotics:  none (indicate start date, and stop date if known)  HPI/Subjective: Alert, no distress post op. Denies acute chest pains   Objective: Vitals:   11/26/15 1231 11/26/15 1321  BP: (!) 96/46 (!) 100/52  Pulse: 69 68  Resp: 20 20  Temp: 97.4 F (36.3 C) 97.6 F (36.4 C)    Intake/Output Summary (Last 24 hours) at 11/26/15 1329 Last data filed at 11/26/15 1235  Gross per 24 hour  Intake             3950 ml  Output              600 ml  Net             3350 ml   Filed Weights   11/25/15 0225  Weight: 78 kg (171 lb 15.3 oz)    Exam:   General:  Comfortable   Cardiovascular: s1,s2 rrr  Respiratory: CTA BL   Abdomen: soft, nt, nm   Musculoskeletal: no pedal edema    Data Reviewed: Basic Metabolic Panel:  Recent Labs Lab 11/25/15 0010 11/26/15 0600  NA 134* 134*  K 4.1 3.9  CL 103 106  CO2 22 23  GLUCOSE 111* 116*  BUN 24* 15  CREATININE 1.30* 1.02  CALCIUM 9.2 8.4*   Liver Function Tests: No results for input(s): AST, ALT, ALKPHOS, BILITOT, PROT, ALBUMIN in the last 168 hours. No results for input(s): LIPASE, AMYLASE in the last 168 hours. No results for input(s): AMMONIA in the last 168 hours. CBC:  Recent Labs Lab 11/25/15 0010 11/26/15 0600  WBC 6.9 10.3  NEUTROABS 4.5  --   HGB 11.8* 9.9*  HCT 35.5* 30.3*  MCV 91.0 90.2  PLT 391 316   Cardiac Enzymes: No results for input(s): CKTOTAL, CKMB, CKMBINDEX, TROPONINI in the last 168 hours. BNP (last 3 results) No results for input(s): BNP in  the last 8760 hours.  ProBNP (last 3 results) No results for input(s): PROBNP in the last 8760 hours.  CBG:  Recent Labs Lab 11/25/15 1131 11/25/15 1639 11/25/15 2119 11/26/15 1044 11/26/15 1126  GLUCAP 101* 133* 142* 136* 148*    Recent Results (from the past 240 hour(s))  Surgical pcr screen     Status: None   Collection Time: 11/25/15  3:07 AM  Result Value Ref Range Status   MRSA, PCR NEGATIVE NEGATIVE Final   Staphylococcus aureus NEGATIVE NEGATIVE Final    Comment:        The Xpert SA Assay (FDA approved for NASAL specimens in patients over 31 years of age), is one component of a comprehensive surveillance program.  Test performance has been validated by [facility] for patients greater than or equal to 58 year old. It is not intended to diagnose infection nor to guide or monitor treatment.      Studies: Ct Head Wo Contrast  Result Date: 11/25/2015 CLINICAL DATA:  Status post fall last night. No loss consciousness. Mild headache. EXAM: CT HEAD WITHOUT CONTRAST CT CERVICAL SPINE WITHOUT CONTRAST TECHNIQUE: Multidetector CT imaging of the head and cervical spine was performed following the standard protocol without intravenous contrast. Multiplanar CT image reconstructions of the cervical spine were also generated. COMPARISON:  06/17/2015 FINDINGS: CT HEAD FINDINGS Brain: No evidence of acute infarction, hemorrhage, extra-axial collection, ventriculomegaly, or mass effect. Old lacunar infarct in the left thalamus. Generalized cerebral atrophy. Periventricular white matter low attenuation likely secondary to microangiopathy. Vascular: Cerebrovascular atherosclerotic calcifications are noted. Skull: Negative for fracture or focal lesion. Sinuses/Orbits: Visualized portions of the orbits are unremarkable. Visualized portions of the paranasal sinuses and mastoid air cells are unremarkable. Other: None. CT CERVICAL SPINE FINDINGS Alignment: 2 mm of anterolisthesis of C4 on C5  and C5 on C6. 5 mm of anterolisthesis of C7 on T1. Anterolisthesis is secondary to facet arthropathy. Skull base and vertebrae: No acute fracture. No primary bone lesion. Erosive changes along the posterior aspect of the odontoid process with pannus formation as can be seen with an inflammatory arthropathy such as rheumatoid arthritis versus crystalline arthropathy. Soft tissues and spinal canal: No prevertebral fluid or swelling. No visible canal hematoma. Disc levels: Degenerative disc disease with disc height loss at C3-4, C4-5, C5-6, C6-7 and C7-T1. Mild bilateral facet arthropathy at C2-3. Severe left facet arthropathy and mild right facet arthropathy at C3-4 with bilateral mild foraminal narrowing. At C4-5 there is severe bilateral facet arthropathy with bilateral foraminal narrowing. At C5-6 there is severe bilateral facet arthropathy and uncovertebral degenerative changes resulting in bilateral mild foraminal narrowing. At C6-7 there is severe bilateral facet arthropathy and bilateral uncovertebral degenerative changes resulting in mild foraminal narrowing. At C7-T1 there is moderate bilateral facet arthropathy with mild bilateral foraminal narrowing. At T1-2 and T2-3 there is mild bilateral facet arthropathy. Upper  chest: Lung apices are clear. Other: Bilateral carotid artery atherosclerosis. IMPRESSION: 1. No acute intracranial pathology. 2. No acute osseous injury of the cervical spine. 3. Diffuse cervical spine spondylosis as described above. Electronically Signed   By: Kathreen Devoid   On: 11/25/2015 09:18   Ct Cervical Spine Wo Contrast  Result Date: 11/25/2015 CLINICAL DATA:  Status post fall last night. No loss consciousness. Mild headache. EXAM: CT HEAD WITHOUT CONTRAST CT CERVICAL SPINE WITHOUT CONTRAST TECHNIQUE: Multidetector CT imaging of the head and cervical spine was performed following the standard protocol without intravenous contrast. Multiplanar CT image reconstructions of the cervical  spine were also generated. COMPARISON:  06/17/2015 FINDINGS: CT HEAD FINDINGS Brain: No evidence of acute infarction, hemorrhage, extra-axial collection, ventriculomegaly, or mass effect. Old lacunar infarct in the left thalamus. Generalized cerebral atrophy. Periventricular white matter low attenuation likely secondary to microangiopathy. Vascular: Cerebrovascular atherosclerotic calcifications are noted. Skull: Negative for fracture or focal lesion. Sinuses/Orbits: Visualized portions of the orbits are unremarkable. Visualized portions of the paranasal sinuses and mastoid air cells are unremarkable. Other: None. CT CERVICAL SPINE FINDINGS Alignment: 2 mm of anterolisthesis of C4 on C5 and C5 on C6. 5 mm of anterolisthesis of C7 on T1. Anterolisthesis is secondary to facet arthropathy. Skull base and vertebrae: No acute fracture. No primary bone lesion. Erosive changes along the posterior aspect of the odontoid process with pannus formation as can be seen with an inflammatory arthropathy such as rheumatoid arthritis versus crystalline arthropathy. Soft tissues and spinal canal: No prevertebral fluid or swelling. No visible canal hematoma. Disc levels: Degenerative disc disease with disc height loss at C3-4, C4-5, C5-6, C6-7 and C7-T1. Mild bilateral facet arthropathy at C2-3. Severe left facet arthropathy and mild right facet arthropathy at C3-4 with bilateral mild foraminal narrowing. At C4-5 there is severe bilateral facet arthropathy with bilateral foraminal narrowing. At C5-6 there is severe bilateral facet arthropathy and uncovertebral degenerative changes resulting in bilateral mild foraminal narrowing. At C6-7 there is severe bilateral facet arthropathy and bilateral uncovertebral degenerative changes resulting in mild foraminal narrowing. At C7-T1 there is moderate bilateral facet arthropathy with mild bilateral foraminal narrowing. At T1-2 and T2-3 there is mild bilateral facet arthropathy. Upper chest:  Lung apices are clear. Other: Bilateral carotid artery atherosclerosis. IMPRESSION: 1. No acute intracranial pathology. 2. No acute osseous injury of the cervical spine. 3. Diffuse cervical spine spondylosis as described above. Electronically Signed   By: Kathreen Devoid   On: 11/25/2015 09:18   Dg Pelvis Portable  Result Date: 11/26/2015 CLINICAL DATA:  Postop from right intertrochanteric hip fracture ORIF. EXAM: PORTABLE PELVIS 1-2 VIEWS COMPARISON:  None FINDINGS: A right intertrochanteric, comminuted fracture has been reduced with an intra medullary rod and compression screw. The major fracture components are normally aligned. The femoral head neck component is separated from the shaft component by 14 mm. There is no significant angulation between the major fracture components. The orthopedic hardware appears well-seated and aligned. IMPRESSION: Reduction of the intertrochanteric comminuted right hip fracture with ORIF as described. No evidence of an operative complication. Electronically Signed   By: Lajean Manes M.D.   On: 11/26/2015 13:06   Dg Knee Right Port  Result Date: 11/25/2015 CLINICAL DATA:  Pain following fall EXAM: PORTABLE RIGHT KNEE - 1-2 VIEW COMPARISON:  None. FINDINGS: Frontal and lateral views were obtained. There is no apparent fracture or dislocation. No joint effusion. There is marked joint space narrowing laterally with moderate narrowing medially and in the patellofemoral joint.  No erosive change. There is calcification in the popliteal artery and proximal trifurcation arteries. IMPRESSION: Osteoarthritic change, most marked laterally. No acute fracture or joint effusion. There is arterial vascular calcification consistent with atherosclerosis. Electronically Signed   By: Lowella Grip III M.D.   On: 11/25/2015 21:22   Dg C-arm 1-60 Min-no Report  Result Date: 11/26/2015 CLINICAL DATA: IM nail right hip C-ARM 1-60 MINUTES Fluoroscopy was utilized by the requesting physician.   No radiographic interpretation.   Dg Hip Operative Unilat W Or W/o Pelvis Right  Result Date: 11/26/2015 CLINICAL DATA:  Hip fracture EXAM: OPERATIVE RIGHT HIP (WITH PELVIS IF PERFORMED) 2 VIEWS TECHNIQUE: Fluoroscopic spot image(s) were submitted for interpretation post-operatively. COMPARISON:  None. FINDINGS: Images demonstrate placement of a dynamic compression screw and intra medullary rod within the femur. There is 1 distal interlocking screw. This transfixes an intertrochanteric right femur fracture. There is anatomic alignment of the larger fracture fragments. IMPRESSION: ORIF intertrochanteric right femur fracture. Electronically Signed   By: Marybelle Killings M.D.   On: 11/26/2015 10:17   Dg Hip Unilat  With Pelvis 2-3 Views Right  Result Date: 11/25/2015 CLINICAL DATA:  Pain deformity of the right hip after a fall. EXAM: DG HIP (WITH OR WITHOUT PELVIS) 2-3V RIGHT COMPARISON:  None. FINDINGS: Acute comminuted inter trochanteric and sub trochanteric fractures of the right he up with displaced lesser trochanteric fragment and with mild varus angulation of the fracture fragments. No evidence of dislocation of the right hip. Previous left hip hemiarthroplasty. Pelvis appears intact. Calcified and tortuous iliac arteries. Vascular calcifications in the right leg. IMPRESSION: Acute comminuted inter trochanteric and sub trochanteric fractures of the right hip with displaced lesser trochanter fragment. Electronically Signed   By: Lucienne Capers M.D.   On: 11/25/2015 02:56   Dg Femur Port, Min 2 Views Right  Result Date: 11/26/2015 CLINICAL DATA:  Postop for IM nail of right femur. EXAM: RIGHT FEMUR PORTABLE 1 VIEW COMPARISON:  Intraoperative films of earlier today. FINDINGS: Intertrochanteric femur fracture, status post ORIF with a single distal locking screw. Vascular calcifications. No acute hardware complication. Advanced osteoarthritis about the knee. Lateral views degraded secondary to technique.  IMPRESSION: Expected appearance after ORIF of proximal right femur fracture. Electronically Signed   By: Abigail Miyamoto M.D.   On: 11/26/2015 13:08    Scheduled Meds: . acetaminophen  1,000 mg Oral Q6H  . atorvastatin  80 mg Oral Daily  . docusate sodium  100 mg Oral BID  . [START ON 11/27/2015] enoxaparin (LOVENOX) injection  40 mg Subcutaneous Q24H  . feeding supplement (ENSURE ENLIVE)  237 mL Oral BID BM  . insulin aspart  0-5 Units Subcutaneous QHS  . insulin aspart  0-9 Units Subcutaneous TID WC  . metoprolol tartrate  12.5 mg Oral BID  . multivitamin with minerals  1 tablet Oral Daily  . senna  1 tablet Oral BID  . tamsulosin  0.4 mg Oral QODAY   Continuous Infusions: . sodium chloride 75 mL/hr at 11/25/15 0252       Time spent: >35 minutes     Kinnie Feil  [facility] Pager [phone]. If 7PM-7AM, please contact night-coverage at [email or web address], password [facility] 11/26/2015, 1:29 PM  LOS: 1 day

## 2015-11-27 LAB — GLUCOSE, CAPILLARY
Glucose-Capillary: 126 mg/dL — ABNORMAL HIGH (ref 65–99)
Glucose-Capillary: 133 mg/dL — ABNORMAL HIGH (ref 65–99)
Glucose-Capillary: 111 mg/dL — ABNORMAL HIGH (ref 65–99)
Glucose-Capillary: 152 mg/dL — ABNORMAL HIGH (ref 65–99)

## 2015-11-27 LAB — CBC
HCT: 24.9 % — ABNORMAL LOW (ref 39.0–52.0)
Hemoglobin: 8 g/dL — ABNORMAL LOW (ref 13.0–17.0)
MCH: 30 pg (ref 26.0–34.0)
MCHC: 32.1 g/dL (ref 30.0–36.0)
MCV: 93.3 fL (ref 78.0–100.0)
Platelets: 262 10*3/uL (ref 150–400)
RBC: 2.67 MIL/uL — ABNORMAL LOW (ref 4.22–5.81)
RDW: 16.8 % — ABNORMAL HIGH (ref 11.5–15.5)
WBC: 9.8 10*3/uL (ref 4.0–10.5)

## 2015-11-27 LAB — BASIC METABOLIC PANEL
Anion gap: 5 (ref 5–15)
BUN: 15 mg/dL (ref 6–20)
CO2: 23 mmol/L (ref 22–32)
Calcium: 8.2 mg/dL — ABNORMAL LOW (ref 8.9–10.3)
Chloride: 106 mmol/L (ref 101–111)
Creatinine, Ser: 0.97 mg/dL (ref 0.61–1.24)
GFR calc Af Amer: >60 mL/min (ref >60)
GFR calc non Af Amer: >60 mL/min (ref >60)
Glucose, Bld: 115 mg/dL — ABNORMAL HIGH (ref 65–99)
Potassium: 3.9 mmol/L (ref 3.5–5.1)
Sodium: 134 mmol/L — ABNORMAL LOW (ref 135–145)

## 2015-11-27 LAB — MRSA PCR SCREENING: MRSA by PCR: NEGATIVE

## 2015-11-27 LAB — TROPONIN I: Troponin I: <0.03 ng/mL (ref <0.03)

## 2015-11-27 MED ORDER — FERROUS SULFATE 325 (65 FE) MG PO TABS
325.0000 mg | ORAL_TABLET | Freq: Three times a day (TID) | ORAL | Status: DC
  Administered 2015-11-28 – 2015-11-30 (×8): 325 mg via ORAL
  Filled 2015-11-27 (×8): qty 1

## 2015-11-27 MED ORDER — SODIUM CHLORIDE 0.9 % IV BOLUS (SEPSIS)
500.0000 mL | Freq: Once | INTRAVENOUS | Status: AC
  Administered 2015-11-27: 500 mL via INTRAVENOUS

## 2015-11-27 MED ORDER — SODIUM CHLORIDE 0.9 % IV SOLN
INTRAVENOUS | Status: DC
  Administered 2015-11-27 – 2015-11-29 (×5): via INTRAVENOUS

## 2015-11-27 MED ORDER — CLOPIDOGREL BISULFATE 75 MG PO TABS
75.0000 mg | ORAL_TABLET | Freq: Every day | ORAL | Status: DC
  Administered 2015-11-28: 75 mg via ORAL
  Filled 2015-11-27: qty 1

## 2015-11-27 MED ORDER — DILTIAZEM HCL 100 MG IV SOLR
5.0000 mg/h | INTRAVENOUS | Status: DC
  Administered 2015-11-27 – 2015-11-28 (×3): 5 mg/h via INTRAVENOUS
  Filled 2015-11-27 (×2): qty 100

## 2015-11-27 NOTE — NC FL2 (Signed)
[place] LEVEL OF CARE SCREENING TOOL     IDENTIFICATION  Patient Name: Herbert Marquez Birthdate: 16-Dec-1927 Sex: male Admission Date (Current Location): 11/24/2015  [facility] and [place] Number:  Herbalist and Address:  [facility],  [place] [address], [place]      Provider Number: [id number]  Attending Physician Name and Address:  Theodis Blaze, MD  Relative Name and Phone Number:       Current Level of Care: Hospital Recommended Level of Care: [place] Prior Approval Number:    Date Approved/Denied:   PASRR Number: [id number]  Discharge Plan: SNF    Current Diagnoses: Patient Active Problem List   Diagnosis Date Noted  . Displaced intertrochanteric fracture of right femur, initial encounter for closed fracture ([place]) 11/26/2015  . Protein-calorie malnutrition, severe 11/26/2015  . Closed right hip fracture ([place]) 11/25/2015  . BPH (benign prostatic hyperplasia) 11/25/2015  . GERD (gastroesophageal reflux disease) 11/25/2015  . CKD (chronic kidney disease), stage II 11/25/2015  . Closed left hip fracture ([place]) 11/25/2015  . Citrobacter infection   . Controlled diabetes mellitus type 2 with complications ([place])   . Urinary frequency   . Sepsis ([place]) 11/05/2015  . Overactive bladder   . Sepsis, unspecified organism ([place]) 11/04/2015  . H/O: CVA (cerebrovascular accident) 11/04/2015  . Palpitations 09/18/2015  . Cerebrovascular accident (CVA) due to bilateral occlusion of posterior cerebral arteries ([place]) 09/18/2015  . DM type 2 with diabetic peripheral neuropathy ([place])   . Gait disturbance, post-stroke 06/23/2015  . 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 Alterations of sensations following CVA (cerebrovascular accident) 06/23/2015  . Thalamic infarction ([place]) 06/21/2015  . Sinus tachycardia ([place]) 06/20/2015  . Benign essential HTN   . Type 2 diabetes mellitus with complication, without long-term  current use of insulin ([place])   . CAD in native artery   . Dysphagia as late effect of cerebrovascular disease   . Leukocytosis   . Hypokalemia   . Hyponatremia   . AKI (acute kidney injury) ([place])   . PSVT (paroxysmal supraventricular tachycardia) ([place])   . HLD (hyperlipidemia)   . Syncope 06/16/2015  . UTI (lower urinary tract infection) 08/31/2011  . Fracture of femoral neck, left ([place]) 03/09/2011  . Obesity 03/09/2011  . COPD (chronic obstructive pulmonary disease) ([place]) 03/09/2011    Orientation RESPIRATION BLADDER Height & Weight     Self, Time, Situation, Place  Normal Continent Weight: 171 lb 15.3 oz (78 kg) Height:  5\' 7"  (170.2 cm)  BEHAVIORAL SYMPTOMS/MOOD NEUROLOGICAL BOWEL NUTRITION STATUS      Continent Diet (see DC summary)  AMBULATORY STATUS COMMUNICATION OF NEEDS Skin   Extensive Assist Verbally Surgical wounds                       Personal Care Assistance Level of Assistance  Bathing, Dressing Bathing Assistance: Maximum assistance Feeding assistance: Independent Dressing Assistance: Maximum assistance     Functional Limitations Info             SPECIAL CARE FACTORS FREQUENCY  PT (By licensed PT), OT (By licensed OT)     PT Frequency: 5/wk OT Frequency: 5/wk            Contractures      Additional Factors Info  Code Status, Allergies Code Status Info: FULL Allergies Info: NKA           Current Medications (11/27/2015):  This is the  current hospital active medication list Current Facility-Administered Medications  Medication Dose Route Frequency Provider Last Rate Last Dose  . acetaminophen (TYLENOL) tablet 650 mg  650 mg Oral Q6H PRN Rod Can, MD       Or  . acetaminophen (TYLENOL) suppository 650 mg  650 mg Rectal Q6H PRN Rod Can, MD      . acetaminophen (TYLENOL) tablet 1,000 mg  1,000 mg Oral Q6H Rod Can, MD   1,000 mg at 11/27/15 0813  . atorvastatin (LIPITOR) tablet 80 mg  80 mg Oral Daily Ivor Costa, MD    80 mg at 11/26/15 1722  . [START ON 11/28/2015] clopidogrel (PLAVIX) tablet 75 mg  75 mg Oral Daily Kinnie Feil, MD      . docusate sodium (COLACE) capsule 100 mg  100 mg Oral BID Rod Can, MD   100 mg at 11/27/15 0947  . enoxaparin (LOVENOX) injection 40 mg  40 mg Subcutaneous Q24H Rod Can, MD   40 mg at 11/27/15 0814  . feeding supplement (ENSURE ENLIVE) (ENSURE ENLIVE) liquid 237 mL  237 mL Oral BID BM Theodis Blaze, MD   237 mL at 11/27/15 1400  . hydrALAZINE (APRESOLINE) injection 5 mg  5 mg Intravenous Q2H PRN Ivor Costa, MD      . HYDROcodone-acetaminophen (NORCO/VICODIN) 5-325 MG per tablet 1-2 tablet  1-2 tablet Oral Q6H PRN Rod Can, MD      . insulin aspart (novoLOG) injection 0-5 Units  0-5 Units Subcutaneous QHS Ivor Costa, MD      . insulin aspart (novoLOG) injection 0-9 Units  0-9 Units Subcutaneous TID WC Ivor Costa, MD   1 Units at 11/27/15 1255  . menthol-cetylpyridinium (CEPACOL) lozenge 3 mg  1 lozenge Oral PRN Rod Can, MD       Or  . phenol (CHLORASEPTIC) mouth spray 1 spray  1 spray Mouth/Throat PRN Rod Can, MD      . methocarbamol (ROBAXIN) tablet 500 mg  500 mg Oral Q6H PRN Rod Can, MD       Or  . methocarbamol (ROBAXIN) 500 mg in dextrose 5 % 50 mL IVPB  500 mg Intravenous Q6H PRN Rod Can, MD      . metoCLOPramide (REGLAN) tablet 5-10 mg  5-10 mg Oral Q8H PRN Rod Can, MD       Or  . metoCLOPramide (REGLAN) injection 5-10 mg  5-10 mg Intravenous Q8H PRN Rod Can, MD      . metoprolol tartrate (LOPRESSOR) tablet 12.5 mg  12.5 mg Oral BID Ivor Costa, MD   12.5 mg at 11/25/15 2123  . morphine 2 MG/ML injection 1 mg  1 mg Intravenous Q4H PRN Theodis Blaze, MD   1 mg at 11/27/15 1004  . multivitamin with minerals tablet 1 tablet  1 tablet Oral Daily Ivor Costa, MD   1 tablet at 11/27/15 0947  . nitroGLYCERIN (NITROSTAT) SL tablet 0.4 mg  0.4 mg Sublingual Q5 min PRN Ivor Costa, MD      . ondansetron [facility]) tablet 4  mg  4 mg Oral Q6H PRN Rod Can, MD       Or  . ondansetron (ZOFRAN) injection 4 mg  4 mg Intravenous Q6H PRN Rod Can, MD      . polyethylene glycol (MIRALAX / GLYCOLAX) packet 17 g  17 g Oral Daily PRN Ivor Costa, MD      . senna (SENOKOT) tablet 8.6 mg  1 tablet Oral BID Rod Can, MD  8.6 mg at 11/27/15 0947  . tamsulosin (FLOMAX) capsule 0.4 mg  0.4 mg Oral QODAY Ivor Costa, MD   0.4 mg at 11/27/15 [id number]     Discharge Medications: Please see discharge summary for a list of discharge medications.  Relevant Imaging Results:  Relevant Lab Results:   Additional Information SS#: [id number]  Jorge Ny, LCSW

## 2015-11-27 NOTE — Evaluation (Signed)
Occupational Therapy Evaluation Patient Details Name: Herbert Marquez MRN: [id number] DOB: Oct 16, 1927 Today's Date: 11/27/2015    History of Present Illness  82 y.o. male with medical history significant of hypertension, hyperlipidemia, diabetes mellitus, GERD, TIA, stroke with mild right-sided weakness, CAD, s/p of stent placement, BPH, arthritis, CKD-II, who presents with right hip pain after fall; s/p R femur IM nail   Clinical Impression   Pt was admitted for the above.  He will benefit from continued OT to increase safety and independence with mobility related to adls.  Goals are for mod +2.   Will also work on [employer] from unsupported sitting at set up level    Follow Up Recommendations  SNF    Equipment Recommendations   (defer to next venue)    Recommendations for Other Services       Precautions / Restrictions Precautions Precautions: Fall Restrictions Weight Bearing Restrictions: Yes RLE Weight Bearing: Partial weight bearing RLE Partial Weight Bearing Percentage or Pounds: 30%       Mobility Bed Mobility Overal bed mobility: +2 for physical assistance;Needs Assistance Bed Mobility: Supine to Sit;Sit to Supine     Supine to sit: +2 for physical assistance;Max assist;+2 for safety/equipment Sit to supine: Max assist;+2 for physical assistance;+2 for safety/equipment   General bed mobility comments: assist for UB and bil LEs, incr time, bed pad utilized for scooting, positioning  Transfers Overall transfer level: Needs assistance Equipment used: Rolling walker (2 wheeled) Transfers: Sit to/from Stand Sit to Stand: Max assist;+2 physical assistance;Mod assist;+2 safety/equipment         General transfer comment: 3 attempts to come to full stand, limited by pain; bed pad used to assist with wt transfer and hip extension on last attempt and pt able to come to full stand    Balance Overall balance assessment: Needs assistance Sitting-balance support:  Bilateral upper extremity supported Sitting balance-Leahy Scale: Fair Sitting balance - Comments: frequent LOB posteriorly, able to maintain static sir with bil UE support with time   Standing balance support: Bilateral upper extremity supported Standing balance-Leahy Scale: Zero                              ADL Overall ADL's : Needs assistance/impaired     Grooming: Set up;Bed level   Upper Body Bathing: Set up;Bed level   Lower Body Bathing: Maximal assistance;+2 for physical assistance;+2 for safety/equipment;Sit to/from stand   Upper Body Dressing : Minimal assistance;Bed level   Lower Body Dressing: Total assistance;+2 for physical assistance;+2 for safety/equipment;Sit to/from stand                 General ADL Comments: UB adls from supported sitting as he was heavily relying on arms when sitting EOB.  Min is due to lines.  Pt able to stand with +2 assistance for brief time with heavy reliance on arms.  Unable to release a hand in standing and unable to reach below knees due to pain     Vision     Perception     Praxis      Pertinent Vitals/Pain Pain Assessment: Faces Faces Pain Scale: Hurts even more Pain Location: RLE Pain Descriptors / Indicators: Grimacing;Guarding;Operative site guarding Pain Intervention(s): Limited activity within patient's tolerance;Monitored during session;Premedicated before session;Ice applied;Repositioned     Hand Dominance Right   Extremity/Trunk Assessment Upper Extremity Assessment Upper Extremity Assessment:  (ROM WFLs, sore from fall; grip 4/5)  Communication Communication Communication: HOH   Cognition Arousal/Alertness: Awake/alert Behavior During Therapy: WFL for tasks assessed/performed Overall Cognitive Status: Within Functional Limits for tasks assessed                     General Comments       Exercises       Shoulder Instructions      Home Living Family/patient  expects to be discharged to:: Skilled nursing facility Living Arrangements: Spouse/significant other Available Help at Discharge: Family;Available 24 hours/day Type of Home: House Home Access: Ramped entrance     Home Layout: One level               Home Equipment: Walker - 2 wheels;Cane - single point;Wheelchair - Education officer, museum Comments: Pt at [facility] 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 after  CVA      Prior Functioning/Environment Level of Independence: Independent with assistive device(s)        Comments: amb with RW, outside distances up to 130'+.  Performed ADLs himself        OT Problem List: Decreased strength;Decreased activity tolerance;Impaired balance (sitting and/or standing);Pain   OT Treatment/Interventions: Self-care/ADL training;DME and/or AE instruction;Therapeutic activities;Patient/family education;Balance training    OT Goals(Current goals can be found in the care plan section) Acute Rehab OT Goals Patient Stated Goal: get stronger OT Goal Formulation: With patient Time For Goal Achievement: 12/04/15 Potential to Achieve Goals: Good ADL Goals Pt Will Transfer to Toilet: with mod assist;with +2 assist;stand pivot transfer;bedside commode Additional ADL Goal #1: pt will go from sit to stand with mod +2 assistance and maintain for 2 minutes during adls with min A Additional ADL Goal #2: pt will sit eob and perform UB adls with set up  OT Frequency: Min 2X/week   Barriers to D/C:            Co-evaluation   Reason for Co-Treatment: For patient/therapist safety PT goals addressed during session: Mobility/safety with mobility        End of Session Nurse Communication: Mobility status  Activity Tolerance: Patient limited by fatigue;Patient limited by pain Patient left: in bed;with call bell/phone within reach;with bed alarm set;with family/visitor present   Time: [id number] OT Time Calculation (min): 32 min Charges:  OT General Charges $OT  Visit: 1 Procedure OT Evaluation $OT Eval Moderate Complexity: 1 Procedure G-Codes:    Elisandra Deshmukh 12-22-2015, 12:15 PM   Lesle Chris, OTR/L [phone] 2015-12-22

## 2015-11-27 NOTE — Progress Notes (Signed)
Subjective: 1 Day Post-Op Procedure(s) (LRB): INTRAMEDULLARY RIGHT (IM) NAIL FEMORAL (Right) Patient reports pain as 3 on 0-10 scale.Past history of a stroke with right lower extremity involvement. Hip woun-no complications.    Objective: Vital signs in last 24 hours: Temp:  [97.4 F (36.3 C)-99.4 F (37.4 C)] 98.1 F (36.7 C) (09/24 0640) Pulse Rate:  [57-80] 80 (09/24 0640) Resp:  [10-22] 18 (09/24 0640) BP: (90-119)/(45-60) 103/45 (09/24 0640) SpO2:  [80 %-100 %] 96 % (09/24 0640)  Intake/Output from previous day: 09/23 0701 - 09/24 0700 In: 2221.3 [P.O.:150; I.V.:1821.3; IV Piggyback:250] Out: 300 [Urine:150; Blood:150] Intake/Output this shift: No intake/output data recorded.   Recent Labs  11/25/15 0010 11/26/15 0600 11/27/15 0503  HGB 11.8* 9.9* 8.0*    Recent Labs  11/26/15 0600 11/27/15 0503  WBC 10.3 9.8  RBC 3.36* 2.67*  HCT 30.3* 24.9*  PLT 316 262    Recent Labs  11/26/15 0600 11/27/15 0503  NA 134* 134*  K 3.9 3.9  CL 106 106  CO2 23 23  BUN 15 15  CREATININE 1.02 0.97  GLUCOSE 116* 115*  CALCIUM 8.4* 8.2*    Recent Labs  11/25/15 0010  INR 1.13    Dorsiflexion/Plantar flexion intact No cellulitis present  Assessment/Plan: 1 Day Post-Op Procedure(s) (LRB): INTRAMEDULLARY RIGHT (IM) NAIL FEMORAL (Right) Up with therapy  Herbert Marquez A 11/27/2015, 9:08 AM

## 2015-11-27 NOTE — Evaluation (Signed)
Physical Therapy Evaluation Patient Details Name: Herbert Marquez MRN: [id number] DOB: May 14, 1927 Today's Date: 11/27/2015   History of Present Illness   82 y.o. male with medical history significant of hypertension, hyperlipidemia, diabetes mellitus, GERD, TIA, stroke with mild right-sided weakness, CAD, s/p of stent placement, BPH, arthritis, CKD-II, who presents with right hip pain after fall; s/p R femur IM nail  Clinical Impression  Pt admitted with above diagnosis. Pt currently with functional limitations due to the deficits listed below (see PT Problem List). * Pt will benefit from skilled PT to increase their independence and safety with mobility to allow discharge to the venue listed below.   Pt motivated and cooperative, limited mostly by pain;  BP in sitting 112/61, HR 80s up to 118 after standing    Follow Up Recommendations SNF    Equipment Recommendations  None recommended by PT    Recommendations for Other Services       Precautions / Restrictions Precautions Precautions: Fall Restrictions Weight Bearing Restrictions: Yes RLE Weight Bearing: Partial weight bearing RLE Partial Weight Bearing Percentage or Pounds: 30%       Mobility  Bed Mobility Overal bed mobility: +2 for physical assistance;Needs Assistance Bed Mobility: Supine to Sit;Sit to Supine     Supine to sit: +2 for physical assistance;Max assist;+2 for safety/equipment Sit to supine: Max assist;+2 for physical assistance;+2 for safety/equipment   General bed mobility comments: assist for UB and bil LEs, incr time, bed pad utilized for scooting, positioning  Transfers Overall transfer level: Needs assistance Equipment used: Rolling walker (2 wheeled) Transfers: Sit to/from Stand Sit to Stand: Max assist;+2 physical assistance;Mod assist;+2 safety/equipment         General transfer comment: 3 attempts to come to full stand, limited by pain; bed pad used to assist with wt transfer and hip  extension on last attempt and pt able to come to full stand  Ambulation/Gait             General Gait Details: unable   Stairs            Wheelchair Mobility    Modified Rankin (Stroke Patients Only)       Balance Overall balance assessment: Needs assistance Sitting-balance support: Bilateral upper extremity supported;Feet supported Sitting balance-Leahy Scale: Fair Sitting balance - Comments: frequent LOB posteriorly, able to maintain static sir with bil UE support with time     Standing balance-Leahy Scale: Zero                               Pertinent Vitals/Pain Pain Assessment: Faces Faces Pain Scale: Hurts even more Pain Location: R LE/hip Pain Descriptors / Indicators: Grimacing;Guarding;Operative site guarding Pain Intervention(s): Limited activity within patient's tolerance;Monitored during session;Premedicated before session    Home Living Family/patient expects to be discharged to:: Skilled nursing facility Living Arrangements: Spouse/significant other Available Help at Discharge: Family;Available 24 hours/day Type of Home: House Home Access: Ramped entrance     Home Layout: One level Home Equipment: Walker - 2 wheels;Cane - single point;Wheelchair - Banker      Prior Function Level of Independence: Independent with assistive device(s)         Comments: amb with RW, outside distances up to 130'+     Hand Dominance   Dominant Hand: Right    Extremity/Trunk Assessment   Upper Extremity Assessment: Defer to OT evaluation  Lower Extremity Assessment: RLE deficits/detail RLE Deficits / Details: able to flex knee/hip to 90/90 sitting EOB, difficulty lifting LE against gravity d/t pain       Communication   Communication: HOH  Cognition Arousal/Alertness: Awake/alert Behavior During Therapy: WFL for tasks assessed/performed Overall Cognitive Status: Within Functional Limits for tasks  assessed                      General Comments      Exercises     Assessment/Plan    PT Assessment Patient needs continued PT services  PT Problem List Decreased strength;Decreased activity tolerance;Decreased balance;Decreased mobility          PT Treatment Interventions DME instruction;Gait training;Stair training;Functional mobility training;Therapeutic activities;Therapeutic exercise;Patient/family education    PT Goals (Current goals can be found in the Care Plan section)  Acute Rehab PT Goals Patient Stated Goal: get stronger PT Goal Formulation: With patient Time For Goal Achievement: 12/04/15 Potential to Achieve Goals: Good    Frequency Min 3X/week   Barriers to discharge        Co-evaluation PT/OT/SLP Co-Evaluation/Treatment: Yes Reason for Co-Treatment: For patient/therapist safety PT goals addressed during session: Mobility/safety with mobility         End of Session Equipment Utilized During Treatment: Gait belt Activity Tolerance: Patient limited by fatigue;Patient limited by pain Patient left: in bed;with call bell/phone within reach;with family/visitor present;with bed alarm set Nurse Communication: Mobility status         Time: [id number] PT Time Calculation (min) (ACUTE ONLY): 32 min   Charges:   PT Evaluation $PT Eval Low Complexity: 1 Procedure     PT G Codes:        Herbert Marquez 12/24/2015, 11:15 AM

## 2015-11-27 NOTE — Progress Notes (Signed)
Pt HR sustaining 120-160s. MD notified. Cardizem drip ordered and order to transfer pt to stepdown unit. Kizzie Ide, RN

## 2015-11-27 NOTE — Progress Notes (Signed)
Pt HR change to 120s-150s, A.Fib on cardiac monitoring, EKG completed reading A.Fib RVR.  MD aware. IVF ordered. ECHO ordered. Set of Troponins ordered. Pt resting comfortably in room, no complaints of chest pain, shortness of breath, speech clear. Will continue to monitor. Kizzie Ide, RN

## 2015-11-27 NOTE — Progress Notes (Addendum)
[facility] PROGRESS NOTE  Herbert Marquez [id number] DOB: 08/17/1927 DOA: 11/24/2015 PCP: Thressa Sheller, MD  Principal Problem:   Closed right hip fracture ([place]) Active Problems:   HLD (hyperlipidemia)   Benign essential HTN   CAD in native artery   H/O: CVA (cerebrovascular accident)   Controlled diabetes mellitus type 2 with complications (HCC)   BPH (benign prostatic hyperplasia)   CKD (chronic kidney disease), stage II   Displaced intertrochanteric fracture of right femur, initial encounter for closed fracture (HCC)   Protein-calorie malnutrition, severe  Brief summary   82 y.o. male with medical history significant of hypertension, hyperlipidemia, diabetes mellitus, GERD, TIA, stroke with mild right-sided weakness, CAD, s/p of stent placement, BPH, arthritis, CKD-II, who presents with right hip pain after fall. Patient states that he fell when he was in wheelchair and tried to reach some thing last night. He injured his right hip, causing severe pain.   ED Course: . X-ray of her right hip showed acute comminuted inter trochanteric and sub trochanteric fractures of the right hip with displaced lesser trochanter fragment. Pt is admitted to telemetry bed as inpatient. Orthopedic surgeon, Dr. Stann Mainland was consulted.  Assessment/Plan:  Closed right hip fracture:  As evidenced by x-ray. Patient has moderate pain now. No neurovascular compromise.  -patient underwent IM nailing on 9/23. pt/ot eval; SNF. Cont pain control   HLD: Last LDL was 53 on 06/17/15. Continue home medications: Lipitor  Benign essential HTN: Continue metoprolol. IV hydralazine when necessary   CAD in native artery: s/p of stent. No CP. Continue metoprolol, Lipitor -resume Plavix in AM  H/O: CVA (cerebrovascular accident): Has mild right-sided weakness. No acute new issues. Continue Lipitor. Resume Plavix AM  DM-II: Last A1c 5.6 on 06/17/15, well controled. Patient is taking Actos at home. Cont  SSI  BPH: stable.  Continue Flomax  CKD (chronic kidney disease), stage II: Slightly worsening renal function. Baseline Crfe 1.1-1.2, his creatinine is 1.30, BUN 24, likely due to dehydration. Received gentle IVF Mild  blood loss anemia. Post op. No s/s of active bleeding. No indication for blood TF. Monitor. Recheck cbc AM   Code Status: full Family Communication: d/w patient, his son (indicate person spoken with, relationship, and if by phone, the number) Disposition Plan:needs SNF   Consultants:  Orthopedics   Procedures:  IM nail   Antibiotics:  none (indicate start date, and stop date if known)  HPI/Subjective: Alert, reports some post op pain. Denies acute chest pains   Objective: Vitals:   11/27/15 0947 11/27/15 0950  BP: (!) 95/43 (!) 95/43  Pulse: 90 90  Resp:  18  Temp:  98.2 F (36.8 C)    Intake/Output Summary (Last 24 hours) at 11/27/15 1245 Last data filed at 11/27/15 0900  Gross per 24 hour  Intake           491.25 ml  Output              150 ml  Net           341.25 ml   Filed Weights   11/25/15 0225  Weight: 78 kg (171 lb 15.3 oz)    Exam:   General:  Comfortable   Cardiovascular: s1,s2 rrr  Respiratory: CTA BL   Abdomen: soft, nt, nm   Musculoskeletal: no pedal edema    Data Reviewed: Basic Metabolic Panel:  Recent Labs Lab 11/25/15 0010 11/26/15 0600 11/27/15 0503  NA 134* 134* 134*  K 4.1 3.9 3.9  CL 103  106 106  CO2 22 23 23   GLUCOSE 111* 116* 115*  BUN 24* 15 15  CREATININE 1.30* 1.02 0.97  CALCIUM 9.2 8.4* 8.2*   Liver Function Tests: No results for input(s): AST, ALT, ALKPHOS, BILITOT, PROT, ALBUMIN in the last 168 hours. No results for input(s): LIPASE, AMYLASE in the last 168 hours. No results for input(s): AMMONIA in the last 168 hours. CBC:  Recent Labs Lab 11/25/15 0010 11/26/15 0600 11/27/15 0503  WBC 6.9 10.3 9.8  NEUTROABS 4.5  --   --   HGB 11.8* 9.9* 8.0*  HCT 35.5* 30.3* 24.9*  MCV  91.0 90.2 93.3  PLT 391 316 262   Cardiac Enzymes: No results for input(s): CKTOTAL, CKMB, CKMBINDEX, TROPONINI in the last 168 hours. BNP (last 3 results) No results for input(s): BNP in the last 8760 hours.  ProBNP (last 3 results) No results for input(s): PROBNP in the last 8760 hours.  CBG:  Recent Labs Lab 11/26/15 1126 11/26/15 1709 11/26/15 2136 11/27/15 0736 11/27/15 1201  GLUCAP 148* 139* 160* 126* 133*    Recent Results (from the past 240 hour(s))  Surgical pcr screen     Status: None   Collection Time: 11/25/15  3:07 AM  Result Value Ref Range Status   MRSA, PCR NEGATIVE NEGATIVE Final   Staphylococcus aureus NEGATIVE NEGATIVE Final    Comment:        The Xpert SA Assay (FDA approved for NASAL specimens in patients over 89 years of age), is one component of a comprehensive surveillance program.  Test performance has been validated by [facility] for patients greater than or equal to 68 year old. It is not intended to diagnose infection nor to guide or monitor treatment.      Studies: Dg Pelvis Portable  Result Date: 11/26/2015 CLINICAL DATA:  Postop from right intertrochanteric hip fracture ORIF. EXAM: PORTABLE PELVIS 1-2 VIEWS COMPARISON:  None FINDINGS: A right intertrochanteric, comminuted fracture has been reduced with an intra medullary rod and compression screw. The major fracture components are normally aligned. The femoral head neck component is separated from the shaft component by 14 mm. There is no significant angulation between the major fracture components. The orthopedic hardware appears well-seated and aligned. IMPRESSION: Reduction of the intertrochanteric comminuted right hip fracture with ORIF as described. No evidence of an operative complication. Electronically Signed   By: Lajean Manes M.D.   On: 11/26/2015 13:06   Dg Knee Right Port  Result Date: 11/25/2015 CLINICAL DATA:  Pain following fall EXAM: PORTABLE RIGHT KNEE - 1-2 VIEW  COMPARISON:  None. FINDINGS: Frontal and lateral views were obtained. There is no apparent fracture or dislocation. No joint effusion. There is marked joint space narrowing laterally with moderate narrowing medially and in the patellofemoral joint. No erosive change. There is calcification in the popliteal artery and proximal trifurcation arteries. IMPRESSION: Osteoarthritic change, most marked laterally. No acute fracture or joint effusion. There is arterial vascular calcification consistent with atherosclerosis. Electronically Signed   By: Lowella Grip III M.D.   On: 11/25/2015 21:22   Dg C-arm 1-60 Min-no Report  Result Date: 11/26/2015 CLINICAL DATA: IM nail right hip C-ARM 1-60 MINUTES Fluoroscopy was utilized by the requesting physician.  No radiographic interpretation.   Dg Hip Operative Unilat W Or W/o Pelvis Right  Result Date: 11/26/2015 CLINICAL DATA:  Hip fracture EXAM: OPERATIVE RIGHT HIP (WITH PELVIS IF PERFORMED) 2 VIEWS TECHNIQUE: Fluoroscopic spot image(s) were submitted for interpretation post-operatively. COMPARISON:  None. FINDINGS:  Images demonstrate placement of a dynamic compression screw and intra medullary rod within the femur. There is 1 distal interlocking screw. This transfixes an intertrochanteric right femur fracture. There is anatomic alignment of the larger fracture fragments. IMPRESSION: ORIF intertrochanteric right femur fracture. Electronically Signed   By: Marybelle Killings M.D.   On: 11/26/2015 10:17   Dg Femur Port, Min 2 Views Right  Result Date: 11/26/2015 CLINICAL DATA:  Postop for IM nail of right femur. EXAM: RIGHT FEMUR PORTABLE 1 VIEW COMPARISON:  Intraoperative films of earlier today. FINDINGS: Intertrochanteric femur fracture, status post ORIF with a single distal locking screw. Vascular calcifications. No acute hardware complication. Advanced osteoarthritis about the knee. Lateral views degraded secondary to technique. IMPRESSION: Expected appearance after  ORIF of proximal right femur fracture. Electronically Signed   By: Abigail Miyamoto M.D.   On: 11/26/2015 13:08    Scheduled Meds: . acetaminophen  1,000 mg Oral Q6H  . atorvastatin  80 mg Oral Daily  . docusate sodium  100 mg Oral BID  . enoxaparin (LOVENOX) injection  40 mg Subcutaneous Q24H  . feeding supplement (ENSURE ENLIVE)  237 mL Oral BID BM  . insulin aspart  0-5 Units Subcutaneous QHS  . insulin aspart  0-9 Units Subcutaneous TID WC  . metoprolol tartrate  12.5 mg Oral BID  . multivitamin with minerals  1 tablet Oral Daily  . senna  1 tablet Oral BID  . tamsulosin  0.4 mg Oral QODAY   Continuous Infusions:       Time spent: >35 minutes     Kinnie Feil  [facility] Pager [phone]. If 7PM-7AM, please contact night-coverage at [email or web address], password [facility] 11/27/2015, 12:45 PM  LOS: 2 days    Addendum: patient is noted to have a fib RVR likely post op. No acute chest pains, BP is stable. We will TF to step down due to uncontrolled HR, started iv cardizem, obtain echo, troponins.will transition to oral BB if stable.  Patient is not a good candidate for anticoagulation due to risk of bleeding and fall. But will f/u echo, and monitor on tele /SDU. Cont monitor Natalio Salois N

## 2015-11-27 NOTE — Progress Notes (Signed)
Confirmed with pt and pt wife that they would like SNF placement- hopeful for private room at [place]- weekday CSW to follow up with offers.  CSW will continue to follow  Jorge Ny MSW, [place] #: [phone]

## 2015-11-27 NOTE — Progress Notes (Signed)
BP soft, Cardizem titrated down, BP no improvement, NP paged and bolus ordered. Bolus initiated and will continue to monitor.

## 2015-11-28 ENCOUNTER — Inpatient Hospital Stay (HOSPITAL_COMMUNITY): Payer: Medicare Other

## 2015-11-28 DIAGNOSIS — I10 Essential (primary) hypertension: Secondary | ICD-10-CM

## 2015-11-28 DIAGNOSIS — S72001S Fracture of unspecified part of neck of right femur, sequela: Secondary | ICD-10-CM

## 2015-11-28 DIAGNOSIS — I4891 Unspecified atrial fibrillation: Secondary | ICD-10-CM

## 2015-11-28 DIAGNOSIS — N182 Chronic kidney disease, stage 2 (mild): Secondary | ICD-10-CM

## 2015-11-28 DIAGNOSIS — Z8673 Personal history of transient ischemic attack (TIA), and cerebral infarction without residual deficits: Secondary | ICD-10-CM

## 2015-11-28 DIAGNOSIS — I480 Paroxysmal atrial fibrillation: Secondary | ICD-10-CM

## 2015-11-28 DIAGNOSIS — N400 Enlarged prostate without lower urinary tract symptoms: Secondary | ICD-10-CM

## 2015-11-28 LAB — ECHOCARDIOGRAM COMPLETE
Height: 67 in
Weight: 2984.15 oz

## 2015-11-28 LAB — CBC
HCT: 23.3 % — ABNORMAL LOW (ref 39.0–52.0)
Hemoglobin: 7.7 g/dL — ABNORMAL LOW (ref 13.0–17.0)
MCH: 30.1 pg (ref 26.0–34.0)
MCHC: 33 g/dL (ref 30.0–36.0)
MCV: 91 fL (ref 78.0–100.0)
Platelets: 254 10*3/uL (ref 150–400)
RBC: 2.56 MIL/uL — ABNORMAL LOW (ref 4.22–5.81)
RDW: 16.3 % — ABNORMAL HIGH (ref 11.5–15.5)
WBC: 9.8 10*3/uL (ref 4.0–10.5)

## 2015-11-28 LAB — BASIC METABOLIC PANEL
Anion gap: 5 (ref 5–15)
BUN: 15 mg/dL (ref 6–20)
CO2: 21 mmol/L — ABNORMAL LOW (ref 22–32)
Calcium: 8 mg/dL — ABNORMAL LOW (ref 8.9–10.3)
Chloride: 106 mmol/L (ref 101–111)
Creatinine, Ser: 0.96 mg/dL (ref 0.61–1.24)
GFR calc Af Amer: >60 mL/min (ref >60)
GFR calc non Af Amer: >60 mL/min (ref >60)
Glucose, Bld: 129 mg/dL — ABNORMAL HIGH (ref 65–99)
Potassium: 3.8 mmol/L (ref 3.5–5.1)
Sodium: 132 mmol/L — ABNORMAL LOW (ref 135–145)

## 2015-11-28 LAB — GLUCOSE, CAPILLARY
Glucose-Capillary: 197 mg/dL — ABNORMAL HIGH (ref 65–99)
Glucose-Capillary: 169 mg/dL — ABNORMAL HIGH (ref 65–99)
Glucose-Capillary: 136 mg/dL — ABNORMAL HIGH (ref 65–99)
Glucose-Capillary: 192 mg/dL — ABNORMAL HIGH (ref 65–99)

## 2015-11-28 MED ORDER — METOPROLOL TARTRATE 25 MG PO TABS
25.0000 mg | ORAL_TABLET | Freq: Two times a day (BID) | ORAL | Status: DC
  Administered 2015-11-28 – 2015-11-30 (×4): 25 mg via ORAL
  Filled 2015-11-28 (×4): qty 1

## 2015-11-28 NOTE — Progress Notes (Signed)
   Subjective:  Patient reports pain as mild to moderate.  Transferred to SDU for afib c RVR.  Objective:   VITALS:   Vitals:   11/28/15 0351 11/28/15 0400 11/28/15 0500 11/28/15 0600  BP:  (!) 101/50 (!) 113/46 (!) 111/57  Pulse:  (!) 128 100 92  Resp:  10 18 17   Temp: 99.1 F (37.3 C)     TempSrc: Oral     SpO2:  95% 96% 94%  Weight:      Height:        NAD ABD soft Sensation intact distally Intact pulses distally Dorsiflexion/Plantar flexion intact Incision: dressing C/D/I Compartment soft   Lab Results  Component Value Date   WBC 9.8 11/28/2015   HGB 7.7 (L) 11/28/2015   HCT 23.3 (L) 11/28/2015   MCV 91.0 11/28/2015   PLT 254 11/28/2015   BMET    Component Value Date/Time   NA 132 (L) 11/28/2015 0338   K 3.8 11/28/2015 0338   CL 106 11/28/2015 0338   CO2 21 (L) 11/28/2015 0338   GLUCOSE 129 (H) 11/28/2015 0338   BUN 15 11/28/2015 0338   CREATININE 0.96 11/28/2015 0338   CREATININE 1.36 (H) 06/14/2015 0901   CALCIUM 8.0 (L) 11/28/2015 0338   GFRNONAA >60 11/28/2015 0338   GFRAA >60 11/28/2015 0338     Assessment/Plan: 2 Days Post-Op   Principal Problem:   Closed right hip fracture (HCC) Active Problems:   HLD (hyperlipidemia)   Benign essential HTN   CAD in native artery   H/O: CVA (cerebrovascular accident)   Controlled diabetes mellitus type 2 with complications (HCC)   BPH (benign prostatic hyperplasia)   CKD (chronic kidney disease), stage II   Displaced intertrochanteric fracture of right femur, initial encounter for closed fracture (HCC)   Protein-calorie malnutrition, severe    TDWB RLE with walker PT/OT for bed to chair xfers DVT ppx: lovenox, SCDs, TEDs PT/OT D/C planning, likely SNF placement    Jenniefer Salak, Horald Pollen 11/28/2015, 7:24 AM   Rod Can, MD Cell [phone]

## 2015-11-28 NOTE — Progress Notes (Signed)
Date:  November 28, 2015 Chart reviewed for concurrent status and case management needs. Will continue to follow the patient for status change: post op- A.Fib with IV cardizem Discharge Planning: following for needs Expected discharge date: [id number] Bonnie Roig, BSN, [place], [place]

## 2015-11-28 NOTE — Progress Notes (Addendum)
PROGRESS NOTE    Herbert Marquez  [id number] DOB: [birth year] DOA: 11/24/2015  PCP: Thressa Sheller, MD   Brief Narrative:   82 y.o.malewith past medical history significant for hypertension, hyperlipidemia, diabetes mellitus, GERD, TIA, stroke with mild residual right-sided weakness, CAD, s/p stent placement, BPH, CKD-II. Patient presented to [facility] long hospital with right hip pain status post fall. Patient was in the wheelchair and tried to reach for the things and he fell and injured his right hip.   On admission, patient was found to have acute comminuted intertrochanteric and subtrochanteric fractures of the right hip with displaced lesser trochanter fragment. Patient is status post intramedullary nail surgery 11/26/2015. Hospital course complicated with the development of atrial fibrillation and need for Cardizem drip.  Assessment & Plan:  Closed right hip fracture - Pt sustained acute comminuted intertrochanteric and subtrochanteric fractures of the right hip with displaced lesser trochanter fragment - Status post intramedullary nail surgery 11/26/2015 - To SNF once stable   Dyslipidemia associated with type 2 diabetes mellitus - Continue Lipitor 80 mg daily  Benign essential hypertension  - Patient is slightly hypotensive but he is on Cardizem drip  New onset atrial fibrillation - Perhaps triggered after the surgery, stress related - Patient on Cardizem drip but blood pressure in mid 80s - Appreciate cardiology consult and recommendations  CAD of native artery without angina pectoris - S/p stent - Continue Plavix daily  History of CVA - Continue Plavix - Continue Lipitor  Diabetes mellitus controlled with diabetic nephropathy without long-term insulin use - A1c in April 2017 was 5.6 - Patient is currently on sliding scale insulin but at home patient is taking Actos  BPH - Continue Flomax  CKD (chronic kidney disease), stage II - Baseline creatinine 1.1  - 1.2 - Creatinine now within normal range   DVT prophylaxis: Lovenox subcutaneous Code Status: full code  Family Communication: Wife at the bedside Disposition Plan: Remains in step down unit because he requires Cardizem drip   Consultants:   Cardiology  Procedures:   None   Antimicrobials:   None    Subjective: No overnight events.  Objective: Vitals:   11/28/15 0400 11/28/15 0500 11/28/15 0600 11/28/15 0800  BP: (!) 101/50 (!) 113/46 (!) 111/57   Pulse: (!) 128 100 92   Resp: 10 18 17    Temp:    98.3 F (36.8 C)  TempSrc:    Oral  SpO2: 95% 96% 94%   Weight:      Height:        Intake/Output Summary (Last 24 hours) at 11/28/15 0856 Last data filed at 11/28/15 0600  Gross per 24 hour  Intake          2084.47 ml  Output              300 ml  Net          1784.47 ml   Filed Weights   11/25/15 0225 11/27/15 1739  Weight: 78 kg (171 lb 15.3 oz) 84.6 kg (186 lb 8.2 oz)    Examination:  General exam: Appears calm and comfortable  Respiratory system: Clear to auscultation. Respiratory effort normal. Cardiovascular system: S1 & S2 heard, irregular heart rate, tachycardic  Gastrointestinal system: Abdomen is nondistended, soft and nontender. No organomegaly or masses felt. Normal bowel sounds heard. Central nervous system: Alert and oriented. No focal neurological deficits. Extremities: Symmetric 5 x 5 power. Skin: No rashes, lesions or ulcers Psychiatry: Judgement and insight appear normal. Mood & affect  appropriate.   Data Reviewed: I have personally reviewed following labs and imaging studies  CBC:  Recent Labs Lab 11/25/15 0010 11/26/15 0600 11/27/15 0503 11/28/15 0338  WBC 6.9 10.3 9.8 9.8  NEUTROABS 4.5  --   --   --   HGB 11.8* 9.9* 8.0* 7.7*  HCT 35.5* 30.3* 24.9* 23.3*  MCV 91.0 90.2 93.3 91.0  PLT 391 316 262 0000000   Basic Metabolic Panel:  Recent Labs Lab 11/25/15 0010 11/26/15 0600 11/27/15 0503 11/28/15 0338  NA 134* 134* 134*  132*  K 4.1 3.9 3.9 3.8  CL 103 106 106 106  CO2 22 23 23  21*  GLUCOSE 111* 116* 115* 129*  BUN 24* 15 15 15   CREATININE 1.30* 1.02 0.97 0.96  CALCIUM 9.2 8.4* 8.2* 8.0*   GFR: Estimated Creatinine Clearance: 57.4 mL/min (by C-G formula based on SCr of 0.96 mg/dL). Liver Function Tests: No results for input(s): AST, ALT, ALKPHOS, BILITOT, PROT, ALBUMIN in the last 168 hours. No results for input(s): LIPASE, AMYLASE in the last 168 hours. No results for input(s): AMMONIA in the last 168 hours. Coagulation Profile:  Recent Labs Lab 11/25/15 0010  INR 1.13   Cardiac Enzymes:  Recent Labs Lab 11/27/15 1646  TROPONINI <0.03   BNP (last 3 results) No results for input(s): PROBNP in the last 8760 hours. HbA1C: No results for input(s): HGBA1C in the last 72 hours. CBG:  Recent Labs Lab 11/27/15 0736 11/27/15 1201 11/27/15 1707 11/27/15 2127 11/28/15 0750  GLUCAP 126* 133* 111* 152* 197*   Lipid Profile: No results for input(s): CHOL, HDL, LDLCALC, TRIG, CHOLHDL, LDLDIRECT in the last 72 hours. Thyroid Function Tests: No results for input(s): TSH, T4TOTAL, FREET4, T3FREE, THYROIDAB in the last 72 hours. Anemia Panel: No results for input(s): VITAMINB12, FOLATE, FERRITIN, TIBC, IRON, RETICCTPCT in the last 72 hours. Urine analysis:    Component Value Date/Time   COLORURINE YELLOW 11/04/2015 1645   APPEARANCEUR CLOUDY (A) 11/04/2015 1645   LABSPEC 1.019 11/04/2015 1645   PHURINE 8.0 11/04/2015 1645   GLUCOSEU NEGATIVE 11/04/2015 1645   HGBUR NEGATIVE 11/04/2015 1645   BILIRUBINUR NEGATIVE 11/04/2015 1645   KETONESUR 40 (A) 11/04/2015 1645   PROTEINUR 30 (A) 11/04/2015 1645   UROBILINOGEN 0.2 08/31/2011 1033   NITRITE NEGATIVE 11/04/2015 1645   LEUKOCYTESUR MODERATE (A) 11/04/2015 1645   Sepsis Labs: @LABRCNTIP (procalcitonin:4,lacticidven:4)   Recent Results (from the past 240 hour(s))  Surgical pcr screen     Status: None   Collection Time: 11/25/15  3:07  AM  Result Value Ref Range Status   MRSA, PCR NEGATIVE NEGATIVE Final   Staphylococcus aureus NEGATIVE NEGATIVE Final  MRSA PCR Screening     Status: None   Collection Time: 11/27/15  5:51 PM  Result Value Ref Range Status   MRSA by PCR NEGATIVE NEGATIVE Final      Radiology Studies: Ct Head Wo Contrast Result Date: 11/25/2015 1. No acute intracranial pathology. 2. No acute osseous injury of the cervical spine. 3. Diffuse cervical spine spondylosis as described above. Electronically Signed   By: Kathreen Devoid   On: 11/25/2015 09:18   Ct Cervical Spine Wo Contrast Result Date: 11/25/2015 1. No acute intracranial pathology. 2. No acute osseous injury of the cervical spine. 3. Diffuse cervical spine spondylosis as described above. Electronically Signed   By: Kathreen Devoid   On: 11/25/2015 09:18   Dg Pelvis Portable Result Date: 11/26/2015 Reduction of the intertrochanteric comminuted right hip fracture with ORIF as  described. No evidence of an operative complication. Electronically Signed   By: Lajean Manes M.D.   On: 11/26/2015 13:06   Dg Knee Right Port Result Date: 11/25/2015 Osteoarthritic change, most marked laterally. No acute fracture or joint effusion. There is arterial vascular calcification consistent with atherosclerosis. Electronically Signed   By: Lowella Grip III M.D.   On: 11/25/2015 21:22   Dg C-arm 1-60 Min-no Report Result Date: 11/26/2015 CLINICAL DATA: IM nail right hip C-ARM 1-60 MINUTES Fluoroscopy was utilized by the requesting physician.  No radiographic interpretation.   Dg Hip Operative Unilat W Or W/o Pelvis Right Result Date: 11/26/2015 ORIF intertrochanteric right femur fracture. Electronically Signed   By: Marybelle Killings M.D.   On: 11/26/2015 10:17   Dg Hip Unilat  With Pelvis 2-3 Views Right Result Date: 11/25/2015 Acute comminuted inter trochanteric and sub trochanteric fractures of the right hip with displaced lesser trochanter fragment. Electronically  Signed   By: Lucienne Capers M.D.   On: 11/25/2015 02:56   Dg Femur Port, Min 2 Views Right Result Date: 11/26/2015 Expected appearance after ORIF of proximal right femur fracture. Electronically Signed   By: Abigail Miyamoto M.D.   On: 11/26/2015 13:08      Scheduled Meds: . atorvastatin  80 mg Oral Daily  . clopidogrel  75 mg Oral Daily  . docusate sodium  100 mg Oral BID  . enoxaparin (LOVENOX) injection  40 mg Subcutaneous Q24H  . feeding supplement (ENSURE ENLIVE)  237 mL Oral BID BM  . ferrous sulfate  325 mg Oral TID WC  . insulin aspart  0-5 Units Subcutaneous QHS  . insulin aspart  0-9 Units Subcutaneous TID WC  . metoprolol tartrate  12.5 mg Oral BID  . multivitamin with minerals  1 tablet Oral Daily  . senna  1 tablet Oral BID  . tamsulosin  0.4 mg Oral QODAY   Continuous Infusions: . sodium chloride 75 mL/hr at 11/28/15 0600  . diltiazem (CARDIZEM) infusion 5 mg/hr (11/28/15 0600)     LOS: 3 days    Time spent: 25 minutes  Greater than 50% of the time spent on counseling and coordinating the care.   Leisa Lenz, MD [facility] Pager [phone]  If 7PM-7AM, please contact night-coverage [email or web address] Password [facility] 11/28/2015, 8:56 AM    Patient ID: Herbert Marquez, male   DOB: June 30, 1927, 82 y.o.   MRN: [id number]

## 2015-11-28 NOTE — Progress Notes (Signed)
LCSWA provide family with SNF list, to follow up with patient and wife.

## 2015-11-28 NOTE — Progress Notes (Signed)
*  PRELIMINARY RESULTS* Echocardiogram 2D Echocardiogram has been performed.  Leavy Cella 11/28/2015, 12:09 PM

## 2015-11-28 NOTE — Progress Notes (Signed)
Attempted to assist patient to pivot to bedside commode with 2 assist. Patient unable to support weight on left leg to transfer. Patient assisted back to lying position and assisted with bed pan.

## 2015-11-28 NOTE — Progress Notes (Signed)
Bolus given, BP stable and Cardizem restarted d/t tachycardia. Will continue to monitor patient.
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
## 2015-11-29 ENCOUNTER — Encounter (HOSPITAL_COMMUNITY): Payer: Self-pay | Admitting: Orthopedic Surgery

## 2015-11-29 DIAGNOSIS — E785 Hyperlipidemia, unspecified: Secondary | ICD-10-CM

## 2015-11-29 DIAGNOSIS — I481 Persistent atrial fibrillation: Secondary | ICD-10-CM

## 2015-11-29 DIAGNOSIS — E118 Type 2 diabetes mellitus with unspecified complications: Secondary | ICD-10-CM

## 2015-11-29 DIAGNOSIS — I2510 Atherosclerotic heart disease of native coronary artery without angina pectoris: Secondary | ICD-10-CM

## 2015-11-29 LAB — GLUCOSE, CAPILLARY
Glucose-Capillary: 145 mg/dL — ABNORMAL HIGH (ref 65–99)
Glucose-Capillary: 174 mg/dL — ABNORMAL HIGH (ref 65–99)
Glucose-Capillary: 172 mg/dL — ABNORMAL HIGH (ref 65–99)
Glucose-Capillary: 131 mg/dL — ABNORMAL HIGH (ref 65–99)

## 2015-11-29 LAB — TYPE AND SCREEN
ABO/RH(D): A POS
Antibody Screen: NEGATIVE
Unit division: 0
Unit division: 0

## 2015-11-29 LAB — CBC
HCT: 21.1 % — ABNORMAL LOW (ref 39.0–52.0)
Hemoglobin: 6.9 g/dL — CL (ref 13.0–17.0)
MCH: 30.1 pg (ref 26.0–34.0)
MCHC: 32.7 g/dL (ref 30.0–36.0)
MCV: 92.1 fL (ref 78.0–100.0)
Platelets: 279 10*3/uL (ref 150–400)
RBC: 2.29 MIL/uL — ABNORMAL LOW (ref 4.22–5.81)
RDW: 16.3 % — ABNORMAL HIGH (ref 11.5–15.5)
WBC: 10.6 10*3/uL — ABNORMAL HIGH (ref 4.0–10.5)

## 2015-11-29 LAB — PREPARE RBC (CROSSMATCH)

## 2015-11-29 LAB — HEMOGLOBIN AND HEMATOCRIT, BLOOD
HCT: 25.8 % — ABNORMAL LOW (ref 39.0–52.0)
Hemoglobin: 8.6 g/dL — ABNORMAL LOW (ref 13.0–17.0)

## 2015-11-29 LAB — OCCULT BLOOD X 1 CARD TO LAB, STOOL: Fecal Occult Bld: NEGATIVE

## 2015-11-29 MED ORDER — APIXABAN 5 MG PO TABS: 5.0000 mg | ORAL_TABLET | Freq: Two times a day (BID) | ORAL | Status: DC

## 2015-11-29 MED ORDER — APIXABAN 5 MG PO TABS
5.0000 mg | ORAL_TABLET | Freq: Two times a day (BID) | ORAL | Status: DC
Start: 2015-11-29 — End: 2015-11-30
  Administered 2015-11-29 – 2015-11-30 (×2): 5 mg via ORAL
  Filled 2015-11-29 (×2): qty 1

## 2015-11-29 MED ORDER — SODIUM CHLORIDE 0.9 % IV SOLN
Freq: Once | INTRAVENOUS | Status: AC
  Administered 2015-11-29: 09:00:00 via INTRAVENOUS

## 2015-11-29 MED ORDER — SODIUM CHLORIDE 0.9 % IV SOLN: Freq: Once | INTRAVENOUS | Status: DC

## 2015-11-29 NOTE — Progress Notes (Signed)
CRITICAL VALUE ALERT  Critical value received:  Hgb 6.9  Date of notification:  9/26  Time of notification:  0427  Critical value read back:Yes.    Nurse who received alert:  Sheina Mcleish, Bing Neighbors, RN  MD notified (1st page):  [place]  Time of first page:  [phone]  MD notified (2nd page):  Time of second page:  Responding MD:    Time MD responded:

## 2015-11-29 NOTE — Clinical Social Work Note (Signed)
Clinical Social Work Assessment  Patient Details  Name: Herbert Marquez MRN: [id number] Date of Birth: 12/01/1927  Date of referral:  11/25/15               Reason for consult:  Facility Placement                Permission sought to share information with:  Facility Sport and exercise psychologist, Family Supports Permission granted to share information::     Name::        Agency::     Relationship::     Contact Information:     Housing/Transportation Living arrangements for the past 2 months:  Apartment Source of Information:  Spouse Patient Interpreter Needed:  None Criminal Activity/Legal Involvement Pertinent to Current Situation/Hospitalization:  No - Comment as needed Significant Relationships:  Spouse, Adult Children Lives with:  Spouse Do you feel safe going back to the place where you live?  Yes Need for family participation in patient care:  Yes (Comment)  Care giving concerns:  No concerns by wife   Facilities manager / plan:  CSW met with patient at bedside to confirm bed choice for rehab.  CSW accessed pasaar number and sent message To [facility] letting them know that they are patient's first choice at discharge.  Employment status:  Retired Forensic scientist:  Medicare PT Recommendations:  Not assessed at this time Information / Referral to community resources:  [place]  Patient/Family's Response to care:  Patient reported that he lives at home with his wife of 18 years.  Patient reported that he had never been to any  Inpatient rehab and his first choice is [facility] due to proximity to his home.    Patient/Family's Understanding of and Emotional Response to Diagnosis, Current Treatment, and Prognosis:  Patient hopeful that he will gain  His strength back at rehab and regain the independence he had prior to his hospitalization.   Emotional Assessment Appearance:  Appears stated age Attitude/Demeanor/Rapport:     Affect (typically observed):  Accepting Orientation:  Oriented to Place, Oriented to Self Alcohol / Substance use:  Not Applicable Psych involvement (Current and /or in the community):  No (Comment)  Discharge Needs  Concerns to be addressed:  Discharge Planning Concerns, Care Coordination Readmission within the last 30 days:  Yes Current discharge risk:  None Barriers to Discharge:  Continued Medical Work up   [employer], LCSW 11/29/2015, 12:38 PM

## 2015-11-29 NOTE — Progress Notes (Signed)
PT Cancellation Note  Patient Details Name: Herbert Marquez MRN: [id number] DOB: 1927/10/22   Cancelled Treatment:    Reason Eval/Treat Not Completed: Medical issues which prohibited therapy; pt is hypotensive, remains on Cardizem drip and with Hgb 6.9 today. Defer today and see next day or as schedule and medical issues allow   [facility] 11/29/2015, 9:42 AM

## 2015-11-29 NOTE — Consult Note (Signed)
   [facility] CM Inpatient Consult   11/29/2015  MIAN KOLM 13-Mar-1927 [id number]     Patient screened for potential [facility] Care Management services. Chart reviewed. Noted discharge plan is for SNF.  There are no identifiable [facility] Care Management needs at this time. If patient's post hospital needs change, please place a [facility] Care Management consult. For questions please contact:  Marthenia Rolling, [place], RN,BSN [facility] Liaison [phone]

## 2015-11-29 NOTE — Progress Notes (Signed)
Nutrition Follow-up  DOCUMENTATION CODES:   Severe malnutrition in context of acute illness/injury  INTERVENTION:  - Continue Ensure Enlive BID; may use this to give medications rather than water. - Continue to encourage PO intakes of meals and supplement. - RD will continue to monitor for additional nutrition-related needs.  NUTRITION DIAGNOSIS:   Increased nutrient needs related to wound healing (s/p operation) as evidenced by estimated needs. -ongoing  GOAL:   Patient will meet greater than or equal to 90% of their needs -likely minimally meeting on average.   MONITOR:   PO intake, Supplement acceptance, Weight trends, I & O's  ASSESSMENT:   82 y.o. male with medical history significant of hypertension, hyperlipidemia, diabetes mellitus, GERD, TIA, stroke with mild right-sided weakness, CAD, s/p of stent placement, BPH, arthritis, CKD-II, who presents with right hip pain after fall.  9/26 Pt POD #3 R hip fixation. Per chart review, pt has been eating 50-75% of meals since advancement from CLD to Regular diet 9/23. Pt reports that for breakfast this AM he had scrambled eggs, sausage, a muffin, cranberry juice, and coffee. He denies any chewing difficulties with this meal. Pt states that he drinks Ensure intermittently; RN preparing to provide supplement during the time of RD visit. Pt states that his wife is a good cook and that they try to eat a variety of foods and to focus on protein. Encouraged pt to continue to focus on these things after d/c.  Estimated nutrition needs updated this AM based on pt's age, skin WDL other than surgical wound.   Medications reviewed; sliding scale Novolog, PRN Reglan, daily multivitamin with minerals, PRN Zofran, 1 tablet Senokot BID.  Labs reviewed; CBG: 145 mg/dL this AM, Na: 132 mmol/L yesterday, Ca: 8 mg/dL yesterday.  IVF: NS @ 75 mL/hr.     9/22 - Found to have right hip IT fracture. Plan for operative intervention tomorrow morning. -  Patient sleeping at time of assessment. Wife and daughter present in room.  - They report that in April he had a stroke and had initially not been eating well (<50% intake for at least 1 week) and had lost weight.  - They report his appetite and intake have improved since then.  - He has been eating 3 meals/day with snacks occasionally.  - Per chart it appears patient's UBW was in the range of 192-197 lbs. Family agreed.  - He lost 9.3 kg (11% body weight) over approximately 2 weeks (4/18-5/06) and has not since regained weight.   Usual intake:  Breakfast - cereal, or eggs and toast Lunch - soup or sandwich Dinner - chicken or fish with vegetables and potatoes Snacks - popcorn  - Nutrition-Focused physical exam completed.  - Findings are mild fat depletion, moderate muscle depletion. Unable to assess lower body including edema.     Diet Order:  Diet regular Room service appropriate? Yes; Fluid consistency: Thin  Skin:  Wound (see comment) (R hip surgical wound from 11/26/15)  Last BM:  9/26  Height:   Ht Readings from Last 1 Encounters:  11/27/15 5\' 7"  (1.702 m)    Weight:   Wt Readings from Last 1 Encounters:  11/27/15 186 lb 8.2 oz (84.6 kg)    Ideal Body Weight:  67.27 kg  BMI:  Body mass index is 29.21 kg/m.  Estimated Nutritional Needs:   Kcal:  1600-1800  Protein:  70-80 grams  Fluid:  1.8-2 L/day  EDUCATION NEEDS:   Education needs addressed (Increased protein and calorie  needs post-operatively.)    Jarome Matin, MS, RD, LDN Inpatient Clinical Dietitian Pager # [phone] After hours/weekend pager # [phone]

## 2015-11-29 NOTE — NC FL2 (Signed)
[place] LEVEL OF CARE SCREENING TOOL     IDENTIFICATION  Patient Name: Herbert Marquez Birthdate: 1927-05-26 Sex: male Admission Date (Current Location): 11/24/2015  [facility] and [place] Number:  Herbalist and Address:  [facility]  [place] [address], [place]      Provider Number: [id number]  Attending Physician Name and Address:  Robbie Lis, MD  Relative Name and Phone Number:       Current Level of Care: Hospital Recommended Level of Care: [place] Prior Approval Number:    Date Approved/Denied:   PASRR Number: [id number]  Discharge Plan: SNF    Current Diagnoses: Patient Active Problem List   Diagnosis Date Noted  . Displaced intertrochanteric fracture of right femur, initial encounter for closed fracture ([place]) 11/26/2015  . Protein-calorie malnutrition, severe 11/26/2015  . Closed right hip fracture ([place]) 11/25/2015  . BPH (benign prostatic hyperplasia) 11/25/2015  . GERD (gastroesophageal reflux disease) 11/25/2015  . CKD (chronic kidney disease), stage II 11/25/2015  . Closed left hip fracture ([place]) 11/25/2015  . Citrobacter infection   . Controlled diabetes mellitus type 2 with complications ([place])   . Urinary frequency   . Sepsis ([place]) 11/05/2015  . Overactive bladder   . Sepsis, unspecified organism ([place]) 11/04/2015  . H/O: CVA (cerebrovascular accident) 11/04/2015  . Palpitations 09/18/2015  . Cerebrovascular accident (CVA) due to bilateral occlusion of posterior cerebral arteries ([place]) 09/18/2015  . DM type 2 with diabetic peripheral neuropathy ([place])   . Gait disturbance, post-stroke 06/23/2015  . 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 Alterations of sensations following CVA (cerebrovascular accident) 06/23/2015  . Thalamic infarction ([place]) 06/21/2015  . Sinus tachycardia ([place]) 06/20/2015  . Benign essential HTN   . Type 2 diabetes mellitus with complication, without long-term  current use of insulin ([place])   . CAD in native artery   . Dysphagia as late effect of cerebrovascular disease   . Leukocytosis   . Hypokalemia   . Hyponatremia   . AKI (acute kidney injury) ([place])   . PSVT (paroxysmal supraventricular tachycardia) ([place])   . HLD (hyperlipidemia)   . Syncope 06/16/2015  . UTI (lower urinary tract infection) 08/31/2011  . Fracture of femoral neck, left ([place]) 03/09/2011  . Obesity 03/09/2011  . COPD (chronic obstructive pulmonary disease) ([place]) 03/09/2011    Orientation RESPIRATION BLADDER Height & Weight     Self, Time, Situation, Place  Normal Continent Weight: 186 lb 8.2 oz (84.6 kg) Height:  5\' 7"  (170.2 cm)  BEHAVIORAL SYMPTOMS/MOOD NEUROLOGICAL BOWEL NUTRITION STATUS      Continent Diet (see DC summary)  AMBULATORY STATUS COMMUNICATION OF NEEDS Skin   Extensive Assist Verbally Surgical wounds                       Personal Care Assistance Level of Assistance  Bathing, Dressing Bathing Assistance: Maximum assistance Feeding assistance: Independent Dressing Assistance: Maximum assistance     Functional Limitations Info  Hearing   Hearing Info: Impaired      SPECIAL CARE FACTORS FREQUENCY  PT (By licensed PT), OT (By licensed OT)     PT Frequency: 5/wk OT Frequency: 5/wk            Contractures      Additional Factors Info  Code Status, Allergies Code Status Info: FULL Allergies Info: NKA           Current Medications (11/29/2015):  This is the  current hospital active medication list Current Facility-Administered Medications  Medication Dose Route Frequency Provider Last Rate Last Dose  . 0.9 %  sodium chloride infusion   Intravenous Continuous Kinnie Feil, MD 75 mL/hr at 11/29/15 0700    . 0.9 %  sodium chloride infusion   Intravenous Once Jeryl Columbia, NP   Stopped at 11/29/15 [phone]  . acetaminophen (TYLENOL) tablet 650 mg  650 mg Oral Q6H PRN Rod Can, MD       Or  . acetaminophen (TYLENOL)  suppository 650 mg  650 mg Rectal Q6H PRN Rod Can, MD      . atorvastatin (LIPITOR) tablet 80 mg  80 mg Oral Daily Ivor Costa, MD   80 mg at 11/28/15 1707  . docusate sodium (COLACE) capsule 100 mg  100 mg Oral BID Rod Can, MD   100 mg at 11/29/15 1025  . feeding supplement (ENSURE ENLIVE) (ENSURE ENLIVE) liquid 237 mL  237 mL Oral BID BM Theodis Blaze, MD   237 mL at 11/29/15 1025  . ferrous sulfate tablet 325 mg  325 mg Oral TID WC Kinnie Feil, MD   325 mg at 11/29/15 [id number]  . hydrALAZINE (APRESOLINE) injection 5 mg  5 mg Intravenous Q2H PRN Ivor Costa, MD      . HYDROcodone-acetaminophen (NORCO/VICODIN) 5-325 MG per tablet 1-2 tablet  1-2 tablet Oral Q6H PRN Rod Can, MD   1 tablet at 11/28/15 0912  . insulin aspart (novoLOG) injection 0-5 Units  0-5 Units Subcutaneous QHS Ivor Costa, MD      . insulin aspart (novoLOG) injection 0-9 Units  0-9 Units Subcutaneous TID WC Ivor Costa, MD   1 Units at 11/29/15 [phone]  . menthol-cetylpyridinium (CEPACOL) lozenge 3 mg  1 lozenge Oral PRN Rod Can, MD       Or  . phenol (CHLORASEPTIC) mouth spray 1 spray  1 spray Mouth/Throat PRN Rod Can, MD      . methocarbamol (ROBAXIN) tablet 500 mg  500 mg Oral Q6H PRN Rod Can, MD       Or  . methocarbamol (ROBAXIN) 500 mg in dextrose 5 % 50 mL IVPB  500 mg Intravenous Q6H PRN Rod Can, MD      . metoCLOPramide (REGLAN) tablet 5-10 mg  5-10 mg Oral Q8H PRN Rod Can, MD       Or  . metoCLOPramide (REGLAN) injection 5-10 mg  5-10 mg Intravenous Q8H PRN Rod Can, MD      . metoprolol tartrate (LOPRESSOR) tablet 25 mg  25 mg Oral BID Thayer Headings, MD   25 mg at 11/29/15 1025  . morphine 2 MG/ML injection 1 mg  1 mg Intravenous Q4H PRN Theodis Blaze, MD   1 mg at 11/27/15 1004  . multivitamin with minerals tablet 1 tablet  1 tablet Oral Daily Ivor Costa, MD   1 tablet at 11/29/15 1025  . nitroGLYCERIN (NITROSTAT) SL tablet 0.4 mg  0.4 mg Sublingual Q5 min PRN  Ivor Costa, MD      . ondansetron [facility]) tablet 4 mg  4 mg Oral Q6H PRN Rod Can, MD       Or  . ondansetron (ZOFRAN) injection 4 mg  4 mg Intravenous Q6H PRN Rod Can, MD      . polyethylene glycol (MIRALAX / GLYCOLAX) packet 17 g  17 g Oral Daily PRN Ivor Costa, MD      . senna (SENOKOT) tablet 8.6 mg  1 tablet Oral  BID Rod Can, MD   8.6 mg at 11/29/15 1025  . tamsulosin (FLOMAX) capsule 0.4 mg  0.4 mg Oral QODAY Ivor Costa, MD   0.4 mg at 11/29/15 1025     Discharge Medications: Please see discharge summary for a list of discharge medications.  Relevant Imaging Results:  Relevant Lab Results:   Additional Information SS#: [id number]  Carlean Jews, LCSW

## 2015-11-29 NOTE — Progress Notes (Signed)
Patient Profile: 82 y/o male with h/o CAD, HTN, HLD, DM and h/o CVA/ TIA  admitted for fall resulting in right hip Fx. S/p surgical repair. Post operative course complicated by atrial fibrillation w/ RVR.   Subjective: Feels well. Eating breakfast. Family by bedside. No major complaints. Hip pain level improved. Bowels are moving well. He denies melena.   Objective: Vital signs in last 24 hours: Temp:  [98.1 F (36.7 C)-99.3 F (37.4 C)] 98.4 F (36.9 C) (09/26 0402) Pulse Rate:  [59-120] 87 (09/26 0700) Resp:  [16-28] 22 (09/26 0700) BP: (76-160)/(39-105) 102/54 (09/26 0700) SpO2:  [92 %-100 %] 97 % (09/26 0700) Last BM Date: 11/28/15  Intake/Output from previous day: 09/25 0701 - 09/26 0700 In: 2480 [P.O.:480; I.V.:2000] Out: 300 [Urine:300] Intake/Output this shift: No intake/output data recorded.  Medications Current Facility-Administered Medications  Medication Dose Route Frequency Provider Last Rate Last Dose  . 0.9 %  sodium chloride infusion   Intravenous Continuous Kinnie Feil, MD 75 mL/hr at 11/29/15 0700    . 0.9 %  sodium chloride infusion   Intravenous Once Jeryl Columbia, NP      . acetaminophen (TYLENOL) tablet 650 mg  650 mg Oral Q6H PRN Rod Can, MD       Or  . acetaminophen (TYLENOL) suppository 650 mg  650 mg Rectal Q6H PRN Rod Can, MD      . atorvastatin (LIPITOR) tablet 80 mg  80 mg Oral Daily Ivor Costa, MD   80 mg at 11/28/15 1707  . diltiazem (CARDIZEM) 100 mg in dextrose 5 % 100 mL (1 mg/mL) infusion  5-15 mg/hr Intravenous Titrated Kinnie Feil, MD 5 mL/hr at 11/29/15 0700 5 mg/hr at 11/29/15 0700  . docusate sodium (COLACE) capsule 100 mg  100 mg Oral BID Rod Can, MD   100 mg at 11/28/15 2212  . enoxaparin (LOVENOX) injection 40 mg  40 mg Subcutaneous Q24H Rod Can, MD   40 mg at 11/28/15 0759  . feeding supplement (ENSURE ENLIVE) (ENSURE ENLIVE) liquid 237 mL  237 mL Oral BID BM Theodis Blaze, MD   237 mL at  11/28/15 1048  . ferrous sulfate tablet 325 mg  325 mg Oral TID WC Kinnie Feil, MD   325 mg at 11/28/15 1707  . hydrALAZINE (APRESOLINE) injection 5 mg  5 mg Intravenous Q2H PRN Ivor Costa, MD      . HYDROcodone-acetaminophen (NORCO/VICODIN) 5-325 MG per tablet 1-2 tablet  1-2 tablet Oral Q6H PRN Rod Can, MD   1 tablet at 11/28/15 0912  . insulin aspart (novoLOG) injection 0-5 Units  0-5 Units Subcutaneous QHS Ivor Costa, MD      . insulin aspart (novoLOG) injection 0-9 Units  0-9 Units Subcutaneous TID WC Ivor Costa, MD   1 Units at 11/28/15 1707  . menthol-cetylpyridinium (CEPACOL) lozenge 3 mg  1 lozenge Oral PRN Rod Can, MD       Or  . phenol (CHLORASEPTIC) mouth spray 1 spray  1 spray Mouth/Throat PRN Rod Can, MD      . methocarbamol (ROBAXIN) tablet 500 mg  500 mg Oral Q6H PRN Rod Can, MD       Or  . methocarbamol (ROBAXIN) 500 mg in dextrose 5 % 50 mL IVPB  500 mg Intravenous Q6H PRN Rod Can, MD      . metoCLOPramide (REGLAN) tablet 5-10 mg  5-10 mg Oral Q8H PRN Rod Can, MD       Or  .  metoCLOPramide (REGLAN) injection 5-10 mg  5-10 mg Intravenous Q8H PRN Rod Can, MD      . metoprolol tartrate (LOPRESSOR) tablet 25 mg  25 mg Oral BID Thayer Headings, MD   25 mg at 11/28/15 2212  . morphine 2 MG/ML injection 1 mg  1 mg Intravenous Q4H PRN Theodis Blaze, MD   1 mg at 11/27/15 1004  . multivitamin with minerals tablet 1 tablet  1 tablet Oral Daily Ivor Costa, MD   1 tablet at 11/28/15 1047  . nitroGLYCERIN (NITROSTAT) SL tablet 0.4 mg  0.4 mg Sublingual Q5 min PRN Ivor Costa, MD      . ondansetron [facility]) tablet 4 mg  4 mg Oral Q6H PRN Rod Can, MD       Or  . ondansetron (ZOFRAN) injection 4 mg  4 mg Intravenous Q6H PRN Rod Can, MD      . polyethylene glycol (MIRALAX / GLYCOLAX) packet 17 g  17 g Oral Daily PRN Ivor Costa, MD      . senna [facility]) tablet 8.6 mg  1 tablet Oral BID Rod Can, MD   8.6 mg at 11/28/15 2212   . tamsulosin (FLOMAX) capsule 0.4 mg  0.4 mg Oral QODAY Ivor Costa, MD   0.4 mg at 11/27/15 0947    PE: General appearance: alert, cooperative and no distress Neck: no carotid bruit and no JVD Lungs: clear to auscultation bilaterally Heart: irregularly irregular rhythm and regular rate Extremities: no LEE Pulses: 2+ and symmetric Skin: warm and dry Neurologic: Grossly normal  Lab Results:   Recent Labs  11/27/15 0503 11/28/15 0338 11/29/15 0409  WBC 9.8 9.8 10.6*  HGB 8.0* 7.7* 6.9*  HCT 24.9* 23.3* 21.1*  PLT 262 254 279   BMET  Recent Labs  11/27/15 0503 11/28/15 0338  NA 134* 132*  K 3.9 3.8  CL 106 106  CO2 23 21*  GLUCOSE 115* 129*  BUN 15 15  CREATININE 0.97 0.96  CALCIUM 8.2* 8.0*   Cardiac Panel (last 3 results)  Recent Labs  11/27/15 1646  TROPONINI <0.03    Studies/Results: 2D Echo 11/28/15 Study Conclusions  - Left ventricle: The cavity size was normal. Wall thickness was   increased in a pattern of mild LVH. Systolic function was   vigorous. The estimated ejection fraction was in the range of 65%   to 70%. - Right ventricle: The cavity size was mildly dilated.   Assessment/Plan  Principal Problem:   Closed right hip fracture (HCC) Active Problems:   HLD (hyperlipidemia)   Benign essential HTN   CAD in native artery   H/O: CVA (cerebrovascular accident)   Controlled diabetes mellitus type 2 with complications (HCC)   BPH (benign prostatic hyperplasia)   CKD (chronic kidney disease), stage II   Displaced intertrochanteric fracture of right femur, initial encounter for closed fracture (HCC)   Protein-calorie malnutrition, severe   1. Atrial Fibrillation w/ RVR: in the setting of recent orthopedic surgery for treatment of right hip fracture. Also with anemia with Hgb now at 6.9. Suspect rhythm disturbances are secondary to recent surgery, anemia and pain. 2D echo showed vigorous LVF with EF of 65-70%. Continue Cardizem and PO  metoprolol for rate control. Can further increase PO metoprolol dose if needed. Manage his anemia. His CHA2DS2 VASc score is 7 (HTN, Age >75 (2), DM, Stroke (2), Vascular Dz). Despite his recent fall, he reports that he does not fall much. This is his first fall in over  5 years. Given stroke risk, including prior history of CVA, will need to consider long term oral anticoagulation for prophylaxis. However, this will also be dependent on the course of his anemia as well.   2. Rt. Hip Fracture: 2/2 mechanical fall. S/p intramedullary nail surgery 11/26/2015  3. Anemia: 2/2 hip fracture + surgery. Hgb continues to trend downward from 7.7 yesterday to 6.9 today. Plan is for transfusion today, per IM. May also need to r/o GIB with FOBT.   4. CAD: s/p remote PCI + DES to RCA at an outside hospital. He denies CP. On statin, Plavix and BB.   5. HTN: controlled.   6. HLD: continue statin therapy with Lipitor.    LOS: 4 days    Brittainy M. Ladoris Gene 11/29/2015 8:07 AM   Attending Note:   The patient was seen and examined.  Agree with assessment and plan as noted above.  Changes made to the above note as needed.  Patient seen and independently examined with Lyda Jester, PA  .   We discussed all aspects of the encounter. I agree with the assessment and plan as stated above.  Mr. Duren is dong well.   HR is well controlled  On low dose Dilt drip  Will DC the Dilt. Continue metoprolol  CHA2DS2 VASc score is 7 (HTN, Age >75 (2), DM, Stroke (2), Vascular Dz) - he should be started on Eliquis at some point .  Was on Plavix - this has already been Dc'd.  I'm concerned that his Hb is dropping Will check stool quiacs before starting Eliquis    I have spent a total of 40 minutes with patient reviewing hospital  notes , telemetry, EKGs, labs and examining patient as well as establishing an assessment and plan that was discussed with the patient. > 50% of time was spent in direct  patient care.    Thayer Headings, Brooke Bonito., MD, [facility] 11/29/2015, 11:41 AM [address]  [place] Pager [phone]

## 2015-11-29 NOTE — Progress Notes (Addendum)
PROGRESS NOTE    Herbert Marquez  [id number] DOB: 1927/08/31 DOA: 11/24/2015  PCP: Thressa Sheller, MD   Brief Narrative:   82 y.o.malewith past medical history significant for hypertension, hyperlipidemia, diabetes mellitus, GERD, TIA, stroke with mild residual right-sided weakness, CAD, s/p stent placement, BPH, CKD-II. Patient presented to [facility] long hospital with right hip pain status post fall. Patient was in the wheelchair and tried to reach for the things and he fell and injured his right hip.   On admission, patient was found to have acute comminuted intertrochanteric and subtrochanteric fractures of the right hip with displaced lesser trochanter fragment. Patient is status post intramedullary nail surgery 11/26/2015. Hospital course complicated with the development of atrial fibrillation and need for Cardizem drip.  Assessment & Plan:  Closed right hip fracture - Pt sustained acute comminuted intertrochanteric and subtrochanteric fractures of the right hip with displaced lesser trochanter fragment - Status post intramedullary nail surgery 11/26/2015 - To SNF once stable   Dyslipidemia associated with type 2 diabetes mellitus - Continue Lipitor 80 mg daily  Benign essential hypertension  - Per cardio, started metoprolol 25 mg pO BID - Stopped Cardizem drip today   New onset atrial fibrillation - CHADS vasc score 7 - Perhaps triggered after the surgery, stress related - Patient was on Cardizem drip through today - Metoprolol started 9/25 and BP and HR now controlled, at goal  - Appreciate cardiology consult and recommendations  CAD of native artery without angina pectoris - S/p stent - Continue Plavix daily  History of CVA - Continue Plavix - Continue Lipitor  Diabetes mellitus controlled with diabetic nephropathy without long-term insulin use - A1c in April 2017 was 5.6 - Patient is currently on sliding scale insulin but at home patient is taking  Actos  BPH - Continue Flomax  CKD (chronic kidney disease), stage II - Baseline creatinine 1.1 - 1.2 - Creatinine now within normal range   DVT prophylaxis: Lovenox subcutaneous Code Status: full code  Family Communication: Wife at the bedside Disposition Plan: Transfer to telemetry today    Consultants:   Cardiology  Procedures:   None   Antimicrobials:   None    Subjective: No overnight events.  Objective: Vitals:   11/29/15 1000 11/29/15 1030 11/29/15 1100 11/29/15 1130  BP: 105/63 112/89 (!) 105/49 107/65  Pulse: (!) 58 (!) 25 65 72  Resp: 19 (!) 27 20 (!) 25  Temp:   97.7 F (36.5 C)   TempSrc:   Axillary   SpO2: 92% 100% 99% 94%  Weight:      Height:        Intake/Output Summary (Last 24 hours) at 11/29/15 1201 Last data filed at 11/29/15 1000  Gross per 24 hour  Intake             2575 ml  Output                0 ml  Net             2575 ml   Filed Weights   11/25/15 0225 11/27/15 1739  Weight: 78 kg (171 lb 15.3 oz) 84.6 kg (186 lb 8.2 oz)    Examination:  General exam: Appears calm and comfortable, no distress  Respiratory system: No wheezing, no rhonchi  Cardiovascular system: S1 & S2 heard, rate controlled  Gastrointestinal system: (+) BS, non tender  Central nervous system: No focal neurological deficits. Extremities: No swelling, palpable pulses  Skin: warm, dry  Psychiatry: Mood &  affect appropriate.   Data Reviewed: I have personally reviewed following labs and imaging studies  CBC:  Recent Labs Lab 11/25/15 0010 11/26/15 0600 11/27/15 0503 11/28/15 0338 11/29/15 0409  WBC 6.9 10.3 9.8 9.8 10.6*  NEUTROABS 4.5  --   --   --   --   HGB 11.8* 9.9* 8.0* 7.7* 6.9*  HCT 35.5* 30.3* 24.9* 23.3* 21.1*  MCV 91.0 90.2 93.3 91.0 92.1  PLT 391 316 262 254 [id number]   Basic Metabolic Panel:  Recent Labs Lab 11/25/15 0010 11/26/15 0600 11/27/15 0503 11/28/15 0338  NA 134* 134* 134* 132*  K 4.1 3.9 3.9 3.8  CL 103 106 106  106  CO2 22 23 23  21*  GLUCOSE 111* 116* 115* 129*  BUN 24* 15 15 15   CREATININE 1.30* 1.02 0.97 0.96  CALCIUM 9.2 8.4* 8.2* 8.0*   GFR: Estimated Creatinine Clearance: 57.4 mL/min (by C-G formula based on SCr of 0.96 mg/dL). Liver Function Tests: No results for input(s): AST, ALT, ALKPHOS, BILITOT, PROT, ALBUMIN in the last 168 hours. No results for input(s): LIPASE, AMYLASE in the last 168 hours. No results for input(s): AMMONIA in the last 168 hours. Coagulation Profile:  Recent Labs Lab 11/25/15 0010  INR 1.13   Cardiac Enzymes:  Recent Labs Lab 11/27/15 1646  TROPONINI <0.03   BNP (last 3 results) No results for input(s): PROBNP in the last 8760 hours. HbA1C: No results for input(s): HGBA1C in the last 72 hours. CBG:  Recent Labs Lab 11/28/15 0750 11/28/15 1218 11/28/15 1612 11/28/15 2133 11/29/15 0810  GLUCAP 197* 169* 136* 192* 145*   Lipid Profile: No results for input(s): CHOL, HDL, LDLCALC, TRIG, CHOLHDL, LDLDIRECT in the last 72 hours. Thyroid Function Tests: No results for input(s): TSH, T4TOTAL, FREET4, T3FREE, THYROIDAB in the last 72 hours. Anemia Panel: No results for input(s): VITAMINB12, FOLATE, FERRITIN, TIBC, IRON, RETICCTPCT in the last 72 hours. Urine analysis:    Component Value Date/Time   COLORURINE YELLOW 11/04/2015 1645   APPEARANCEUR CLOUDY (A) 11/04/2015 1645   LABSPEC 1.019 11/04/2015 1645   PHURINE 8.0 11/04/2015 1645   GLUCOSEU NEGATIVE 11/04/2015 1645   HGBUR NEGATIVE 11/04/2015 1645   BILIRUBINUR NEGATIVE 11/04/2015 1645   KETONESUR 40 (A) 11/04/2015 1645   PROTEINUR 30 (A) 11/04/2015 1645   UROBILINOGEN 0.2 08/31/2011 1033   NITRITE NEGATIVE 11/04/2015 1645   LEUKOCYTESUR MODERATE (A) 11/04/2015 1645   Sepsis Labs: @LABRCNTIP (procalcitonin:4,lacticidven:4)   Recent Results (from the past 240 hour(s))  Surgical pcr screen     Status: None   Collection Time: 11/25/15  3:07 AM  Result Value Ref Range Status    MRSA, PCR NEGATIVE NEGATIVE Final   Staphylococcus aureus NEGATIVE NEGATIVE Final  MRSA PCR Screening     Status: None   Collection Time: 11/27/15  5:51 PM  Result Value Ref Range Status   MRSA by PCR NEGATIVE NEGATIVE Final      Radiology Studies: Ct Head Wo Contrast Result Date: 11/25/2015 1. No acute intracranial pathology. 2. No acute osseous injury of the cervical spine. 3. Diffuse cervical spine spondylosis as described above. Electronically Signed   By: Kathreen Devoid   On: 11/25/2015 09:18   Ct Cervical Spine Wo Contrast Result Date: 11/25/2015 1. No acute intracranial pathology. 2. No acute osseous injury of the cervical spine. 3. Diffuse cervical spine spondylosis as described above. Electronically Signed   By: Kathreen Devoid   On: 11/25/2015 09:18   Dg Pelvis Portable Result Date: 11/26/2015  Reduction of the intertrochanteric comminuted right hip fracture with ORIF as described. No evidence of an operative complication. Electronically Signed   By: Lajean Manes M.D.   On: 11/26/2015 13:06   Dg Knee Right Port Result Date: 11/25/2015 Osteoarthritic change, most marked laterally. No acute fracture or joint effusion. There is arterial vascular calcification consistent with atherosclerosis. Electronically Signed   By: Lowella Grip III M.D.   On: 11/25/2015 21:22   Dg C-arm 1-60 Min-no Report Result Date: 11/26/2015 CLINICAL DATA: IM nail right hip C-ARM 1-60 MINUTES Fluoroscopy was utilized by the requesting physician.  No radiographic interpretation.   Dg Hip Operative Unilat W Or W/o Pelvis Right Result Date: 11/26/2015 ORIF intertrochanteric right femur fracture. Electronically Signed   By: Marybelle Killings M.D.   On: 11/26/2015 10:17   Dg Hip Unilat  With Pelvis 2-3 Views Right Result Date: 11/25/2015 Acute comminuted inter trochanteric and sub trochanteric fractures of the right hip with displaced lesser trochanter fragment. Electronically Signed   By: Lucienne Capers M.D.    On: 11/25/2015 02:56   Dg Femur Port, Min 2 Views Right Result Date: 11/26/2015 Expected appearance after ORIF of proximal right femur fracture. Electronically Signed   By: Abigail Miyamoto M.D.   On: 11/26/2015 13:08      Scheduled Meds: . sodium chloride   Intravenous Once  . atorvastatin  80 mg Oral Daily  . docusate sodium  100 mg Oral BID  . feeding supplement (ENSURE ENLIVE)  237 mL Oral BID BM  . ferrous sulfate  325 mg Oral TID WC  . insulin aspart  0-5 Units Subcutaneous QHS  . insulin aspart  0-9 Units Subcutaneous TID WC  . metoprolol tartrate  25 mg Oral BID  . multivitamin with minerals  1 tablet Oral Daily  . senna  1 tablet Oral BID  . tamsulosin  0.4 mg Oral QODAY   Continuous Infusions: . sodium chloride 75 mL/hr at 11/29/15 0700     LOS: 4 days    Time spent: 25 minutes  Greater than 50% of the time spent on counseling and coordinating the care.   Leisa Lenz, MD [facility] Pager [phone]  If 7PM-7AM, please contact night-coverage [email or web address] Password [email or web address] 11/29/2015, 12:01 PM    Patient ID: Herbert Marquez, male   DOB: Jul 19, 1927, 82 y.o.   MRN: [id number]

## 2015-11-29 NOTE — Care Management Important Message (Signed)
Important Message  Patient Details  Name: Herbert Marquez MRN: [id number] Date of Birth: 03/31/1927   Medicare Important Message Given:  Yes    [name] 11/29/2015, 3:48 [place] Message  Patient Details  Name: Herbert Marquez MRN: [id number] Date of Birth: 1927/08/08   Medicare Important Message Given:  Yes    [name] 11/29/2015, 3:48 PM

## 2015-11-29 NOTE — Progress Notes (Signed)
   Subjective:  Patient reports pain as mild to moderate.  PRBCs ordered.  Objective:   VITALS:   Vitals:   11/29/15 0300 11/29/15 0402 11/29/15 0500 11/29/15 0600  BP: (!) 105/51 (!) 98/47 (!) 91/54 117/62  Pulse: 73 100 86 81  Resp: 17 19 20  (!) 21  Temp:  98.4 F (36.9 C)    TempSrc:  Oral    SpO2: 95% 94% 97% 98%  Weight:      Height:        NAD ABD soft Sensation intact distally Intact pulses distally Dorsiflexion/Plantar flexion intact Incision: dressing C/D/I Compartment soft No hematoma   Lab Results  Component Value Date   WBC 10.6 (H) 11/29/2015   HGB 6.9 (LL) 11/29/2015   HCT 21.1 (L) 11/29/2015   MCV 92.1 11/29/2015   PLT 279 11/29/2015   BMET    Component Value Date/Time   NA 132 (L) 11/28/2015 0338   K 3.8 11/28/2015 0338   CL 106 11/28/2015 0338   CO2 21 (L) 11/28/2015 0338   GLUCOSE 129 (H) 11/28/2015 0338   BUN 15 11/28/2015 0338   CREATININE 0.96 11/28/2015 0338   CREATININE 1.36 (H) 06/14/2015 0901   CALCIUM 8.0 (L) 11/28/2015 0338   GFRNONAA >60 11/28/2015 0338   GFRAA >60 11/28/2015 [id number]     Assessment/Plan: 3 Days Post-Op   Principal Problem:   Closed right hip fracture (HCC) Active Problems:   HLD (hyperlipidemia)   Benign essential HTN   CAD in native artery   H/O: CVA (cerebrovascular accident)   Controlled diabetes mellitus type 2 with complications (HCC)   BPH (benign prostatic hyperplasia)   CKD (chronic kidney disease), stage II   Displaced intertrochanteric fracture of right femur, initial encounter for closed fracture (HCC)   Protein-calorie malnutrition, severe    TDWB RLE with walker PT/OT for bed to chair xfers DVT ppx: lovenox, SCDs, TEDs PT/OT ABLA per hospitalist D/C planning, SNF placement    Tanay Massiah, Horald Pollen 11/29/2015, 7:06 AM   Rod Can, MD Cell [phone]

## 2015-11-30 DIAGNOSIS — R2681 Unsteadiness on feet: Secondary | ICD-10-CM | POA: Diagnosis not present

## 2015-11-30 DIAGNOSIS — D649 Anemia, unspecified: Secondary | ICD-10-CM | POA: Diagnosis not present

## 2015-11-30 DIAGNOSIS — D72829 Elevated white blood cell count, unspecified: Secondary | ICD-10-CM | POA: Diagnosis not present

## 2015-11-30 DIAGNOSIS — R279 Unspecified lack of coordination: Secondary | ICD-10-CM | POA: Diagnosis not present

## 2015-11-30 DIAGNOSIS — S72101D Unspecified trochanteric fracture of right femur, subsequent encounter for closed fracture with routine healing: Secondary | ICD-10-CM | POA: Diagnosis not present

## 2015-11-30 DIAGNOSIS — R278 Other lack of coordination: Secondary | ICD-10-CM | POA: Diagnosis not present

## 2015-11-30 DIAGNOSIS — S72009A Fracture of unspecified part of neck of unspecified femur, initial encounter for closed fracture: Secondary | ICD-10-CM | POA: Diagnosis not present

## 2015-11-30 DIAGNOSIS — N3281 Overactive bladder: Secondary | ICD-10-CM | POA: Diagnosis not present

## 2015-11-30 DIAGNOSIS — E119 Type 2 diabetes mellitus without complications: Secondary | ICD-10-CM | POA: Diagnosis not present

## 2015-11-30 DIAGNOSIS — E1121 Type 2 diabetes mellitus with diabetic nephropathy: Secondary | ICD-10-CM | POA: Diagnosis not present

## 2015-11-30 DIAGNOSIS — S72141D Displaced intertrochanteric fracture of right femur, subsequent encounter for closed fracture with routine healing: Secondary | ICD-10-CM | POA: Diagnosis not present

## 2015-11-30 DIAGNOSIS — R296 Repeated falls: Secondary | ICD-10-CM | POA: Diagnosis not present

## 2015-11-30 DIAGNOSIS — Z4789 Encounter for other orthopedic aftercare: Secondary | ICD-10-CM | POA: Diagnosis not present

## 2015-11-30 DIAGNOSIS — I10 Essential (primary) hypertension: Secondary | ICD-10-CM | POA: Diagnosis not present

## 2015-11-30 DIAGNOSIS — N320 Bladder-neck obstruction: Secondary | ICD-10-CM | POA: Diagnosis not present

## 2015-11-30 DIAGNOSIS — R41841 Cognitive communication deficit: Secondary | ICD-10-CM | POA: Diagnosis not present

## 2015-11-30 DIAGNOSIS — R7989 Other specified abnormal findings of blood chemistry: Secondary | ICD-10-CM | POA: Diagnosis not present

## 2015-11-30 DIAGNOSIS — E860 Dehydration: Secondary | ICD-10-CM | POA: Diagnosis not present

## 2015-11-30 DIAGNOSIS — S72141A Displaced intertrochanteric fracture of right femur, initial encounter for closed fracture: Secondary | ICD-10-CM | POA: Diagnosis not present

## 2015-11-30 DIAGNOSIS — R269 Unspecified abnormalities of gait and mobility: Secondary | ICD-10-CM | POA: Diagnosis not present

## 2015-11-30 DIAGNOSIS — N390 Urinary tract infection, site not specified: Secondary | ICD-10-CM | POA: Diagnosis not present

## 2015-11-30 DIAGNOSIS — R531 Weakness: Secondary | ICD-10-CM | POA: Diagnosis not present

## 2015-11-30 DIAGNOSIS — M84559D Pathological fracture in neoplastic disease, hip, unspecified, subsequent encounter for fracture with routine healing: Secondary | ICD-10-CM | POA: Diagnosis not present

## 2015-11-30 DIAGNOSIS — G459 Transient cerebral ischemic attack, unspecified: Secondary | ICD-10-CM | POA: Diagnosis not present

## 2015-11-30 DIAGNOSIS — E43 Unspecified severe protein-calorie malnutrition: Secondary | ICD-10-CM | POA: Diagnosis not present

## 2015-11-30 DIAGNOSIS — I4891 Unspecified atrial fibrillation: Secondary | ICD-10-CM | POA: Diagnosis not present

## 2015-11-30 DIAGNOSIS — S72001S Fracture of unspecified part of neck of right femur, sequela: Secondary | ICD-10-CM | POA: Diagnosis not present

## 2015-11-30 DIAGNOSIS — I481 Persistent atrial fibrillation: Secondary | ICD-10-CM | POA: Diagnosis not present

## 2015-11-30 DIAGNOSIS — I69351 Hemiplegia and hemiparesis following cerebral infarction affecting right dominant side: Secondary | ICD-10-CM | POA: Diagnosis not present

## 2015-11-30 DIAGNOSIS — M6281 Muscle weakness (generalized): Secondary | ICD-10-CM | POA: Diagnosis not present

## 2015-11-30 DIAGNOSIS — I4819 Other persistent atrial fibrillation: Secondary | ICD-10-CM

## 2015-11-30 DIAGNOSIS — I2510 Atherosclerotic heart disease of native coronary artery without angina pectoris: Secondary | ICD-10-CM | POA: Diagnosis not present

## 2015-11-30 DIAGNOSIS — M25551 Pain in right hip: Secondary | ICD-10-CM | POA: Diagnosis not present

## 2015-11-30 DIAGNOSIS — S72001A Fracture of unspecified part of neck of right femur, initial encounter for closed fracture: Secondary | ICD-10-CM | POA: Diagnosis not present

## 2015-11-30 DIAGNOSIS — N400 Enlarged prostate without lower urinary tract symptoms: Secondary | ICD-10-CM | POA: Diagnosis not present

## 2015-11-30 DIAGNOSIS — M79604 Pain in right leg: Secondary | ICD-10-CM | POA: Diagnosis not present

## 2015-11-30 LAB — BASIC METABOLIC PANEL
Anion gap: 6 (ref 5–15)
BUN: 18 mg/dL (ref 6–20)
CO2: 20 mmol/L — ABNORMAL LOW (ref 22–32)
Calcium: 7.6 mg/dL — ABNORMAL LOW (ref 8.9–10.3)
Chloride: 104 mmol/L (ref 101–111)
Creatinine, Ser: 0.85 mg/dL (ref 0.61–1.24)
GFR calc Af Amer: >60 mL/min (ref >60)
GFR calc non Af Amer: >60 mL/min (ref >60)
Glucose, Bld: 120 mg/dL — ABNORMAL HIGH (ref 65–99)
Potassium: 3.7 mmol/L (ref 3.5–5.1)
Sodium: 130 mmol/L — ABNORMAL LOW (ref 135–145)

## 2015-11-30 LAB — GLUCOSE, CAPILLARY
Glucose-Capillary: 118 mg/dL — ABNORMAL HIGH (ref 65–99)
Glucose-Capillary: 171 mg/dL — ABNORMAL HIGH (ref 65–99)

## 2015-11-30 LAB — CBC
HCT: 23.6 % — ABNORMAL LOW (ref 39.0–52.0)
Hemoglobin: 7.9 g/dL — ABNORMAL LOW (ref 13.0–17.0)
MCH: 30.2 pg (ref 26.0–34.0)
MCHC: 33.5 g/dL (ref 30.0–36.0)
MCV: 90.1 fL (ref 78.0–100.0)
Platelets: 307 10*3/uL (ref 150–400)
RBC: 2.62 MIL/uL — ABNORMAL LOW (ref 4.22–5.81)
RDW: 16 % — ABNORMAL HIGH (ref 11.5–15.5)
WBC: 11.1 10*3/uL — ABNORMAL HIGH (ref 4.0–10.5)

## 2015-11-30 LAB — PREPARE RBC (CROSSMATCH)

## 2015-11-30 MED ORDER — ENSURE ENLIVE PO LIQD: 237.0000 mL | Freq: Two times a day (BID) | ORAL | 12 refills | Status: DC

## 2015-11-30 MED ORDER — FERROUS SULFATE 325 (65 FE) MG PO TABS: 325.0000 mg | ORAL_TABLET | Freq: Three times a day (TID) | ORAL | 0 refills | Status: DC

## 2015-11-30 MED ORDER — PROPOFOL 10 MG/ML IV BOLUS
INTRAVENOUS | Status: AC
  Filled 2015-11-30: qty 20

## 2015-11-30 MED ORDER — DOCUSATE SODIUM 100 MG PO CAPS: 100.0000 mg | ORAL_CAPSULE | Freq: Every day | ORAL | 0 refills | Status: DC | PRN

## 2015-11-30 MED ORDER — APIXABAN 5 MG PO TABS: 5.0000 mg | ORAL_TABLET | Freq: Two times a day (BID) | ORAL | 0 refills | Status: DC

## 2015-11-30 MED ORDER — SODIUM CHLORIDE 0.9 % IV SOLN: Freq: Once | INTRAVENOUS | Status: DC

## 2015-11-30 MED ORDER — METHOCARBAMOL 500 MG PO TABS: 500.0000 mg | ORAL_TABLET | Freq: Four times a day (QID) | ORAL | 0 refills | Status: DC | PRN

## 2015-11-30 MED ORDER — ROCURONIUM BROMIDE 10 MG/ML (PF) SYRINGE
PREFILLED_SYRINGE | INTRAVENOUS | Status: AC
  Filled 2015-11-30: qty 10

## 2015-11-30 MED ORDER — METOPROLOL TARTRATE 25 MG PO TABS: 25.0000 mg | ORAL_TABLET | Freq: Two times a day (BID) | ORAL | 0 refills | Status: DC

## 2015-11-30 NOTE — Progress Notes (Signed)
[facility] called for transport.     Caitlynne Harbeck, LCSW [facility] Clinical Social Worker cell #: [phone]  

## 2015-11-30 NOTE — Discharge Instructions (Signed)
°Dr. Brian Swinteck °Adult Hip & Knee Specialist °[facility] °[address] °[place], [place] [address] °[phone] ° ° °POSTOPERATIVE DIRECTIONS ° ° ° °Hip Rehabilitation, Guidelines Following Surgery  ° °WEIGHT BEARING °Partial weight bearing with assist device as directed.  touch down weight bearing (30%) ° ° °HOME CARE INSTRUCTIONS  °Remove items at home which could result in a fall. This includes throw rugs or furniture in walking pathways.  °Continue medications as instructed at time of discharge. °· You may have some home medications which will be placed on hold until you complete the course of blood thinner medication. °· 4 days after discharge, you may start showering. No tub baths or soaking your incisions. °Do not put on socks or shoes without following the instructions of your caregivers.   °Sit on chairs with arms. Use the chair arms to help push yourself up when arising.  °Arrange for the use of a toilet seat elevator so you are not sitting low.  °· Walk with walker as instructed.  °You may resume a sexual relationship in one month or when given the OK by your caregiver.  °Use walker as long as suggested by your caregivers.  °Avoid periods of inactivity such as sitting longer than an hour when not asleep. This helps prevent blood clots.  °You may return to work once you are cleared by your surgeon.  °Do not drive a car for 6 weeks or until released by your surgeon.  °Do not drive while taking narcotics.  °Wear elastic stockings for two weeks following surgery during the day but you may remove then at night.  °Make sure you keep all of your appointments after your operation with all of your doctors and caregivers. You should call the office at the above phone number and make an appointment for approximately two weeks after the date of your surgery. °Please pick up a stool softener and laxative for home use as long as you are requiring pain medications. °· ICE to the affected  hip every three hours for 30 minutes at a time and then as needed for pain and swelling. Continue to use ice on the hip for pain and swelling from surgery. You may notice swelling that will progress down to the foot and ankle.  This is normal after surgery.  Elevate the leg when you are not up walking on it.   °It is important for you to complete the blood thinner medication as prescribed by your doctor. °· Continue to use the breathing machine which will help keep your temperature down.  It is common for your temperature to cycle up and down following surgery, especially at night when you are not up moving around and exerting yourself.  The breathing machine keeps your lungs expanded and your temperature down. ° °RANGE OF MOTION AND STRENGTHENING EXERCISES  °These exercises are designed to help you keep full movement of your hip joint. Follow your caregiver's or physical therapist's instructions. Perform all exercises about fifteen times, three times per day or as directed. Exercise both hips, even if you have had only one joint replacement. These exercises can be done on a training (exercise) mat, on the floor, on a table or on a bed. Use whatever works the best and is most comfortable for you. Use music or television while you are exercising so that the exercises are a pleasant break in your day. This will make your life better with the exercises acting as a break in routine you   can look forward to.  °Lying on your back, slowly slide your foot toward your buttocks, raising your knee up off the floor. Then slowly slide your foot back down until your leg is straight again.  °Lying on your back spread your legs as far apart as you can without causing discomfort.  °Lying on your side, raise your upper leg and foot straight up from the floor as far as is comfortable. Slowly lower the leg and repeat.  °Lying on your back, tighten up the muscle in the front of your thigh (quadriceps muscles). You can do this by keeping  your leg straight and trying to raise your heel off the floor. This helps strengthen the largest muscle supporting your knee.  °Lying on your back, tighten up the muscles of your buttocks both with the legs straight and with the knee bent at a comfortable angle while keeping your heel on the floor.  ° °SKILLED REHAB INSTRUCTIONS: °If the patient is transferred to a skilled rehab facility following release from the hospital, a list of the current medications will be sent to the facility for the patient to continue.  When discharged from the skilled rehab facility, please have the facility set up the patient's Home Health Physical Therapy prior to being released. Also, the skilled facility will be responsible for providing the patient with their medications at time of release from the facility to include their pain medication and their blood thinner medication. If the patient is still at the rehab facility at time of the two week follow up appointment, the skilled rehab facility will also need to assist the patient in arranging follow up appointment in our office and any transportation needs. ° °MAKE SURE YOU:  °Understand these instructions.  °Will watch your condition.  °Will get help right away if you are not doing well or get worse. ° °Pick up stool softner and laxative for home use following surgery while on pain medications. °Daily dry dressing changes as needed. °In 4 days, you may remove your dressings and begin taking showers - no tub baths or soaking the incisions. °Continue to use ice for pain and swelling after surgery. °Do not use any lotions or creams on the incision until instructed by your surgeon. ° ° °

## 2015-11-30 NOTE — Clinical Social Work Placement (Signed)
Patient is set to discharge to [facility] today. Patient & wife at bedside made aware. Discharge packet given to RN, Sophia. [facility] will be called for transport once transfusion is complete.     Raynaldo Opitz, [facility] Clinical Social Worker cell #: [phone]    CLINICAL SOCIAL WORK PLACEMENT  NOTE  Date:  11/30/2015  Patient Details  Name: Herbert Marquez MRN: [id number] Date of Birth: 08-Jan-1928  Clinical Social Work is seeking post-discharge placement for this patient at the [place] level of care (*CSW will initial, date and re-position this form in  chart as items are completed):  Yes   Patient/family provided with [facility] Work Department's list of facilities offering this level of care within the geographic area requested by the patient (or if unable, by the patient's family).  Yes   Patient/family informed of their freedom to choose among providers that offer the needed level of care, that participate in Medicare, Medicaid or managed care program needed by the patient, have an available bed and are willing to accept the patient.  Yes   Patient/family informed of [facility]'s ownership interest in [facility] and [facility], as well as of the fact that they are under no obligation to receive care at these facilities.  PASRR submitted to EDS on 11/27/15     PASRR number received on 11/27/15     Existing PASRR number confirmed on       FL2 transmitted to all facilities in geographic area requested by pt/family on 11/27/15     FL2 transmitted to all facilities within larger geographic area on       Patient informed that his/her managed care company has contracts with or will negotiate with certain facilities, including the following:        Yes   Patient/family informed of bed offers received.  Patient chooses bed at [facility] [facility], [place]     Physician recommends and patient  chooses bed at      Patient to be transferred to [facility] on 11/30/15.  Patient to be transferred to facility by [facility]     Patient family notified on 11/30/15 of transfer.  Name of family member notified:  patient's wife at bedside     PHYSICIAN       Additional Comment:    _______________________________________________ Standley Brooking, LCSW 11/30/2015, 11:17 AM

## 2015-11-30 NOTE — Discharge Summary (Signed)
Physician Discharge Summary  Herbert Marquez [id number] DOB: 1927/06/27 DOA: 11/24/2015  PCP: Thressa Sheller, MD  Admit date: 11/24/2015 Discharge date: 11/30/2015  Recommendations for Outpatient Follow-up:  Continue eliquis on discharge Pt has received 1 unit PRBC transfusion prior to discharge Stool for FOBT was negative Please continue to monitor CBC per skilled nursing facility protocol.  Discharge Diagnoses:  Principal Problem:   Closed right hip fracture ([place]) Active Problems:   HLD (hyperlipidemia)   Benign essential HTN   CAD in native artery   H/O: CVA (cerebrovascular accident)   Controlled diabetes mellitus type 2 with complications (HCC)   BPH (benign prostatic hyperplasia)   CKD (chronic kidney disease), stage II   Displaced intertrochanteric fracture of right femur, initial encounter for closed fracture (HCC)   Protein-calorie malnutrition, severe    Discharge Condition: stable   Diet recommendation: as tolerated   History of present illness:  82 y.o.malewith past medical history significant for hypertension, hyperlipidemia, diabetes mellitus, GERD, TIA, stroke with mild residual right-sided weakness, CAD, s/p stent placement, BPH, CKD-II. Patient presented to [facility] with right hip pain status post fall. Patient was in the wheelchair and tried to reach for the things and he fell and injured his right hip.   On admission, patient was found to have acute comminuted intertrochanteric and subtrochanteric fractures of the right hip with displaced lesser trochanter fragment. Patient is status post intramedullary nail surgery 11/26/2015. Hospital course complicated with the development of atrial fibrillation and need for Cardizem drip.  Hospital Course:    Assessment & Plan:  Closed right hip fracture - Pt sustained acute comminuted intertrochanteric and subtrochanteric fractures of the right hip with displaced lesser trochanter fragment -  Status post intramedullary nail surgery 11/26/2015 - To SNF today   Dyslipidemia associated with type 2 diabetes mellitus - Continue Lipitor 80 mg daily  Benign essential hypertension  - Per cardio, continue metoprolol  - Stopped Cardizem drip 9/26  New onset atrial fibrillation / Acute blood loss anemia  - CHADS vasc score 7 - Perhaps triggered after the surgery, stress related - Patient was on Cardizem drip through 9/26 - HR controlled with metoprolol - Started eliquis 9/26 - Hgb down to 7.9; FOBT negative - Give 1 U PRBC prior to discharge - Continue iron supplementation   CAD of native artery without angina pectoris - S/p stent - Started Eliquis 11/29/2015  History of CVA - Continue Eliquis - Plavix stopped due to increased risk of bleeding  - Continue Lipitor  Diabetes mellitus controlled with diabetic nephropathy without long-term insulin use - A1c in April 2017 was 5.6 - Continue Actos  BPH - Continue Flomax  CKD (chronic kidney disease), stage II - Baseline creatinine 1.1 - 1.2 - Creatinine now within normal range    DVT prophylaxis: Lovenox subcutaneous Code Status: full code  Family Communication: Wife and daughter at the bedside    Consultants:   Cardiology  Procedures:   None   Antimicrobials:   None    Signed:  Leisa Lenz, MD  [facility] 11/30/2015, 10:59 AM  Pager #: [phone]  Time spent in minutes: more than 30 minutes   Discharge Exam: Vitals:   11/29/15 2109 11/30/15 0655  BP: (!) 145/66 (!) 144/78  Pulse: 60 94  Resp: 16 17  Temp: 98.1 F (36.7 C) 98.3 F (36.8 C)   Vitals:   11/29/15 1300 11/29/15 1356 11/29/15 2109 11/30/15 0655  BP: (!) 139/108 110/69 (!) 145/66 (!) 144/78  Pulse:  98 64 60 94  Resp: 19 20 16 17   Temp:  98.6 F (37 C) 98.1 F (36.7 C) 98.3 F (36.8 C)  TempSrc:  Oral Oral Oral  SpO2: 96% 96% 97% 95%  Weight:      Height:        General: Pt is alert, follows  commands appropriately, not in acute distress Cardiovascular: irregular rhythm, rate controlled, S1/S2 + Respiratory: Clear to auscultation bilaterally, no wheezing, no crackles, no rhonchi Abdominal: Soft, non tender, non distended, bowel sounds +, no guarding Extremities: no cyanosis, pulses palpable bilaterally DP and PT Neuro: Grossly nonfocal  Discharge Instructions  Discharge Instructions    Call MD for:  difficulty breathing, headache or visual disturbances    Complete by:  As directed    Call MD for:  persistant nausea and vomiting    Complete by:  As directed    Call MD for:  redness, tenderness, or signs of infection (pain, swelling, redness, odor or green/yellow discharge around incision site)    Complete by:  As directed    Call MD for:  severe uncontrolled pain    Complete by:  As directed    Diet - low sodium heart healthy    Complete by:  As directed    Discharge instructions    Complete by:  As directed    Continue eliquis on discharge Pt has received 1 unit PRBC transfusion prior to discharge Stool for FOBT was negative Please continue to monitor CBC per skilled nursing facility protocol.   Increase activity slowly    Complete by:  As directed        Medication List    STOP taking these medications   clopidogrel 75 MG tablet Commonly known as:  PLAVIX     TAKE these medications   acetaminophen 500 MG tablet Commonly known as:  TYLENOL Take 1,000 mg by mouth every 6 (six) hours as needed for mild pain.   apixaban 5 MG Tabs tablet Commonly known as:  ELIQUIS Take 1 tablet (5 mg total) by mouth 2 (two) times daily.   atorvastatin 80 MG tablet Commonly known as:  LIPITOR Take 1 tablet (80 mg total) by mouth daily.   docusate sodium 100 MG capsule Commonly known as:  COLACE Take 1 capsule (100 mg total) by mouth daily as needed for mild constipation.   feeding supplement (ENSURE ENLIVE) Liqd Take 237 mLs by mouth 2 (two) times daily between meals.    ferrous sulfate 325 (65 FE) MG tablet Take 1 tablet (325 mg total) by mouth 3 (three) times daily with meals.   methocarbamol 500 MG tablet Commonly known as:  ROBAXIN Take 1 tablet (500 mg total) by mouth every 6 (six) hours as needed for muscle spasms.   metoprolol tartrate 25 MG tablet Commonly known as:  LOPRESSOR Take 1 tablet (25 mg total) by mouth 2 (two) times daily. What changed:  how much to take   multivitamin tablet Take 1 tablet by mouth daily.   nitroGLYCERIN 0.4 MG SL tablet Commonly known as:  NITROSTAT Place 0.4 mg under the tongue every 5 (five) minutes as needed for chest pain.   pioglitazone 30 MG tablet Commonly known as:  ACTOS Take 0.5 tablets (15 mg total) by mouth daily.   tamsulosin 0.4 MG Caps capsule Commonly known as:  FLOMAX Take 0.4 mg by mouth every other day.      Follow-up Information    Swinteck, Horald Pollen, MD. Schedule an appointment as  soon as possible for a visit in 2 week(s).   Specialty:  Orthopedic Surgery Why:  For wound re-check Contact information: [address] [place] [address] [phone]        Thressa Sheller, MD. Schedule an appointment as soon as possible for a visit in 1 week(s).   Specialty:  Internal Medicine Contact information: [place], [place] [address] [phone]            The results of significant diagnostics from this hospitalization (including imaging, microbiology, ancillary and laboratory) are listed below for reference.    Significant Diagnostic Studies: Ct Head Wo Contrast  Result Date: 11/25/2015 CLINICAL DATA:  Status post fall last night. No loss consciousness. Mild headache. EXAM: CT HEAD WITHOUT CONTRAST CT CERVICAL SPINE WITHOUT CONTRAST TECHNIQUE: Multidetector CT imaging of the head and cervical spine was performed following the standard protocol without intravenous contrast. Multiplanar CT image reconstructions of the cervical  spine were also generated. COMPARISON:  06/17/2015 FINDINGS: CT HEAD FINDINGS Brain: No evidence of acute infarction, hemorrhage, extra-axial collection, ventriculomegaly, or mass effect. Old lacunar infarct in the left thalamus. Generalized cerebral atrophy. Periventricular white matter low attenuation likely secondary to microangiopathy. Vascular: Cerebrovascular atherosclerotic calcifications are noted. Skull: Negative for fracture or focal lesion. Sinuses/Orbits: Visualized portions of the orbits are unremarkable. Visualized portions of the paranasal sinuses and mastoid air cells are unremarkable. Other: None. CT CERVICAL SPINE FINDINGS Alignment: 2 mm of anterolisthesis of C4 on C5 and C5 on C6. 5 mm of anterolisthesis of C7 on T1. Anterolisthesis is secondary to facet arthropathy. Skull base and vertebrae: No acute fracture. No primary bone lesion. Erosive changes along the posterior aspect of the odontoid process with pannus formation as can be seen with an inflammatory arthropathy such as rheumatoid arthritis versus crystalline arthropathy. Soft tissues and spinal canal: No prevertebral fluid or swelling. No visible canal hematoma. Disc levels: Degenerative disc disease with disc height loss at C3-4, C4-5, C5-6, C6-7 and C7-T1. Mild bilateral facet arthropathy at C2-3. Severe left facet arthropathy and mild right facet arthropathy at C3-4 with bilateral mild foraminal narrowing. At C4-5 there is severe bilateral facet arthropathy with bilateral foraminal narrowing. At C5-6 there is severe bilateral facet arthropathy and uncovertebral degenerative changes resulting in bilateral mild foraminal narrowing. At C6-7 there is severe bilateral facet arthropathy and bilateral uncovertebral degenerative changes resulting in mild foraminal narrowing. At C7-T1 there is moderate bilateral facet arthropathy with mild bilateral foraminal narrowing. At T1-2 and T2-3 there is mild bilateral facet arthropathy. Upper chest:  Lung apices are clear. Other: Bilateral carotid artery atherosclerosis. IMPRESSION: 1. No acute intracranial pathology. 2. No acute osseous injury of the cervical spine. 3. Diffuse cervical spine spondylosis as described above. Electronically Signed   By: Kathreen Devoid   On: 11/25/2015 09:18   Ct Cervical Spine Wo Contrast  Result Date: 11/25/2015 CLINICAL DATA:  Status post fall last night. No loss consciousness. Mild headache. EXAM: CT HEAD WITHOUT CONTRAST CT CERVICAL SPINE WITHOUT CONTRAST TECHNIQUE: Multidetector CT imaging of the head and cervical spine was performed following the standard protocol without intravenous contrast. Multiplanar CT image reconstructions of the cervical spine were also generated. COMPARISON:  06/17/2015 FINDINGS: CT HEAD FINDINGS Brain: No evidence of acute infarction, hemorrhage, extra-axial collection, ventriculomegaly, or mass effect. Old lacunar infarct in the left thalamus. Generalized cerebral atrophy. Periventricular white matter low attenuation likely secondary to microangiopathy. Vascular: Cerebrovascular atherosclerotic calcifications are noted. Skull: Negative for fracture or focal lesion. Sinuses/Orbits:  Visualized portions of the orbits are unremarkable. Visualized portions of the paranasal sinuses and mastoid air cells are unremarkable. Other: None. CT CERVICAL SPINE FINDINGS Alignment: 2 mm of anterolisthesis of C4 on C5 and C5 on C6. 5 mm of anterolisthesis of C7 on T1. Anterolisthesis is secondary to facet arthropathy. Skull base and vertebrae: No acute fracture. No primary bone lesion. Erosive changes along the posterior aspect of the odontoid process with pannus formation as can be seen with an inflammatory arthropathy such as rheumatoid arthritis versus crystalline arthropathy. Soft tissues and spinal canal: No prevertebral fluid or swelling. No visible canal hematoma. Disc levels: Degenerative disc disease with disc height loss at C3-4, C4-5, C5-6, C6-7 and  C7-T1. Mild bilateral facet arthropathy at C2-3. Severe left facet arthropathy and mild right facet arthropathy at C3-4 with bilateral mild foraminal narrowing. At C4-5 there is severe bilateral facet arthropathy with bilateral foraminal narrowing. At C5-6 there is severe bilateral facet arthropathy and uncovertebral degenerative changes resulting in bilateral mild foraminal narrowing. At C6-7 there is severe bilateral facet arthropathy and bilateral uncovertebral degenerative changes resulting in mild foraminal narrowing. At C7-T1 there is moderate bilateral facet arthropathy with mild bilateral foraminal narrowing. At T1-2 and T2-3 there is mild bilateral facet arthropathy. Upper chest: Lung apices are clear. Other: Bilateral carotid artery atherosclerosis. IMPRESSION: 1. No acute intracranial pathology. 2. No acute osseous injury of the cervical spine. 3. Diffuse cervical spine spondylosis as described above. Electronically Signed   By: Kathreen Devoid   On: 11/25/2015 09:18   Dg Pelvis Portable  Result Date: 11/26/2015 CLINICAL DATA:  Postop from right intertrochanteric hip fracture ORIF. EXAM: PORTABLE PELVIS 1-2 VIEWS COMPARISON:  None FINDINGS: A right intertrochanteric, comminuted fracture has been reduced with an intra medullary rod and compression screw. The major fracture components are normally aligned. The femoral head neck component is separated from the shaft component by 14 mm. There is no significant angulation between the major fracture components. The orthopedic hardware appears well-seated and aligned. IMPRESSION: Reduction of the intertrochanteric comminuted right hip fracture with ORIF as described. No evidence of an operative complication. Electronically Signed   By: Lajean Manes M.D.   On: 11/26/2015 13:06   Dg Knee Right Port  Result Date: 11/25/2015 CLINICAL DATA:  Pain following fall EXAM: PORTABLE RIGHT KNEE - 1-2 VIEW COMPARISON:  None. FINDINGS: Frontal and lateral views were  obtained. There is no apparent fracture or dislocation. No joint effusion. There is marked joint space narrowing laterally with moderate narrowing medially and in the patellofemoral joint. No erosive change. There is calcification in the popliteal artery and proximal trifurcation arteries. IMPRESSION: Osteoarthritic change, most marked laterally. No acute fracture or joint effusion. There is arterial vascular calcification consistent with atherosclerosis. Electronically Signed   By: Lowella Grip III M.D.   On: 11/25/2015 21:22   Dg C-arm 1-60 Min-no Report  Result Date: 11/26/2015 CLINICAL DATA: IM nail right hip C-ARM 1-60 MINUTES Fluoroscopy was utilized by the requesting physician.  No radiographic interpretation.   Dg Hip Operative Unilat W Or W/o Pelvis Right  Result Date: 11/26/2015 CLINICAL DATA:  Hip fracture EXAM: OPERATIVE RIGHT HIP (WITH PELVIS IF PERFORMED) 2 VIEWS TECHNIQUE: Fluoroscopic spot image(s) were submitted for interpretation post-operatively. COMPARISON:  None. FINDINGS: Images demonstrate placement of a dynamic compression screw and intra medullary rod within the femur. There is 1 distal interlocking screw. This transfixes an intertrochanteric right femur fracture. There is anatomic alignment of the larger fracture fragments. IMPRESSION: ORIF intertrochanteric  right femur fracture. Electronically Signed   By: Marybelle Killings M.D.   On: 11/26/2015 10:17   Dg Hip Unilat  With Pelvis 2-3 Views Right  Result Date: 11/25/2015 CLINICAL DATA:  Pain deformity of the right hip after a fall. EXAM: DG HIP (WITH OR WITHOUT PELVIS) 2-3V RIGHT COMPARISON:  None. FINDINGS: Acute comminuted inter trochanteric and sub trochanteric fractures of the right he up with displaced lesser trochanteric fragment and with mild varus angulation of the fracture fragments. No evidence of dislocation of the right hip. Previous left hip hemiarthroplasty. Pelvis appears intact. Calcified and tortuous iliac  arteries. Vascular calcifications in the right leg. IMPRESSION: Acute comminuted inter trochanteric and sub trochanteric fractures of the right hip with displaced lesser trochanter fragment. Electronically Signed   By: Lucienne Capers M.D.   On: 11/25/2015 02:56   Dg Femur Port, Min 2 Views Right  Result Date: 11/26/2015 CLINICAL DATA:  Postop for IM nail of right femur. EXAM: RIGHT FEMUR PORTABLE 1 VIEW COMPARISON:  Intraoperative films of earlier today. FINDINGS: Intertrochanteric femur fracture, status post ORIF with a single distal locking screw. Vascular calcifications. No acute hardware complication. Advanced osteoarthritis about the knee. Lateral views degraded secondary to technique. IMPRESSION: Expected appearance after ORIF of proximal right femur fracture. Electronically Signed   By: Abigail Miyamoto M.D.   On: 11/26/2015 13:08    Microbiology: Recent Results (from the past 240 hour(s))  Surgical pcr screen     Status: None   Collection Time: 11/25/15  3:07 AM  Result Value Ref Range Status   MRSA, PCR NEGATIVE NEGATIVE Final   Staphylococcus aureus NEGATIVE NEGATIVE Final    Comment:        The Xpert SA Assay (FDA approved for NASAL specimens in patients over 58 years of age), is one component of a comprehensive surveillance program.  Test performance has been validated by [facility] for patients greater than or equal to 64 year old. It is not intended to diagnose infection nor to guide or monitor treatment.   MRSA PCR Screening     Status: None   Collection Time: 11/27/15  5:51 PM  Result Value Ref Range Status   MRSA by PCR NEGATIVE NEGATIVE Final    Comment:        The GeneXpert MRSA Assay (FDA approved for NASAL specimens only), is one component of a comprehensive MRSA colonization surveillance program. It is not intended to diagnose MRSA infection nor to guide or monitor treatment for MRSA infections.      Labs: Basic Metabolic Panel:  Recent Labs Lab  11/25/15 0010 11/26/15 0600 11/27/15 0503 11/28/15 0338 11/30/15 0502  NA 134* 134* 134* 132* 130*  K 4.1 3.9 3.9 3.8 3.7  CL 103 106 106 106 104  CO2 22 23 23  21* 20*  GLUCOSE 111* 116* 115* 129* 120*  BUN 24* 15 15 15 18   CREATININE 1.30* 1.02 0.97 0.96 0.85  CALCIUM 9.2 8.4* 8.2* 8.0* 7.6*   Liver Function Tests: No results for input(s): AST, ALT, ALKPHOS, BILITOT, PROT, ALBUMIN in the last 168 hours. No results for input(s): LIPASE, AMYLASE in the last 168 hours. No results for input(s): AMMONIA in the last 168 hours. CBC:  Recent Labs Lab 11/25/15 0010 11/26/15 0600 11/27/15 0503 11/28/15 0338 11/29/15 0409 11/29/15 1240 11/30/15 0502  WBC 6.9 10.3 9.8 9.8 10.6*  --  11.1*  NEUTROABS 4.5  --   --   --   --   --   --  HGB 11.8* 9.9* 8.0* 7.7* 6.9* 8.6* 7.9*  HCT 35.5* 30.3* 24.9* 23.3* 21.1* 25.8* 23.6*  MCV 91.0 90.2 93.3 91.0 92.1  --  90.1  PLT 391 316 262 254 279  --  307   Cardiac Enzymes:  Recent Labs Lab 11/27/15 1646  TROPONINI <0.03   BNP: BNP (last 3 results) No results for input(s): BNP in the last 8760 hours.  ProBNP (last 3 results) No results for input(s): PROBNP in the last 8760 hours.  CBG:  Recent Labs Lab 11/29/15 0810 11/29/15 1229 11/29/15 1631 11/29/15 2103 11/30/15 0813  GLUCAP 145* 174* 172* 131* 118*

## 2015-11-30 NOTE — Clinical Social Work Placement (Signed)
Patient has a bed at [facility]. CSW has completed FL2 & will continue to follow and assist with discharge when ready.    Raynaldo Opitz, [facility] Clinical Social Worker cell #: [phone]     CLINICAL SOCIAL WORK PLACEMENT  NOTE  Date:  11/30/2015  Patient Details  Name: Herbert Marquez MRN: [id number] Date of Birth: 1927/09/27  Clinical Social Work is seeking post-discharge placement for this patient at the [place] level of care (*CSW will initial, date and re-position this form in  chart as items are completed):  Yes   Patient/family provided with [facility] Work Department's list of facilities offering this level of care within the geographic area requested by the patient (or if unable, by the patient's family).  Yes   Patient/family informed of their freedom to choose among providers that offer the needed level of care, that participate in Medicare, Medicaid or managed care program needed by the patient, have an available bed and are willing to accept the patient.  Yes   Patient/family informed of [facility]'s ownership interest in [facility] and [facility], as well as of the fact that they are under no obligation to receive care at these facilities.  PASRR submitted to EDS on 11/27/15     PASRR number received on 11/27/15     Existing PASRR number confirmed on       FL2 transmitted to all facilities in geographic area requested by pt/family on 11/27/15     FL2 transmitted to all facilities within larger geographic area on       Patient informed that his/her managed care company has contracts with or will negotiate with certain facilities, including the following:        Yes   Patient/family informed of bed offers received.  Patient chooses bed at [place], [place]     Physician recommends and patient chooses bed at      Patient to be transferred to [place], [place]  on  .  Patient to be transferred to facility by       Patient family notified on   of transfer.  Name of family member notified:        PHYSICIAN Please sign FL2     Additional Comment:    _______________________________________________ Standley Brooking, LCSW 11/30/2015, 10:28 AM

## 2015-11-30 NOTE — Progress Notes (Signed)
Occupational Therapy Treatment Patient Details Name: Herbert Marquez MRN: [id number] DOB: 02/04/1928 Today's Date: 11/30/2015    History of present illness  82 y.o. male with medical history significant of hypertension, hyperlipidemia, diabetes mellitus, GERD, TIA, stroke with mild right-sided weakness, CAD, s/p of stent placement, BPH, arthritis, CKD-II, who presents with right hip pain after fall; s/p R femur IM nail   OT comments  Pt is very motivated and tries very hard.  Pt wanted to get OOB; will get one unit of blood when up.   Follow Up Recommendations  SNF    Equipment Recommendations   (defer to SNF)    Recommendations for Other Services      Precautions / Restrictions Precautions Precautions: Fall Restrictions RLE Weight Bearing: Partial weight bearing RLE Partial Weight Bearing Percentage or Pounds: 30%        Mobility Bed Mobility         Supine to sit: Max assist;+2 for physical assistance     General bed mobility comments: assist for trunk and RLE  Transfers   Equipment used: Rolling walker (2 wheeled) Transfers: Sit to/from Stand Sit to Stand: Mod assist;+2 physical assistance;+2 safety/equipment         General transfer comment: cues for UE placement and RLE placement.  Assistance to power up and stabilize. Encouraged heel/toe to pivot to chair, and therapist supported RLE to ensure compliance with WB.  Pt very fatiqued after getting to chair    Balance     Sitting balance-Leahy Scale: Fair       Standing balance-Leahy Scale: Poor                     ADL                           Toilet Transfer: Moderate assistance;+2 for physical assistance;+2 for safety/equipment;Stand-pivot;RW (to chair)             General ADL Comments: pt is now able to sit unsupported without use of hands for support.  Pt cued for heel toe to pivot to chair.  Fatiqued quickly and dyspnea 3/4 with transfer. Pt to get one more unit of blood.   No c/o lightheadedness      Vision                     Perception     Praxis      Cognition   Behavior During Therapy: WFL for tasks assessed/performed Overall Cognitive Status: Within Functional Limits for tasks assessed                       Extremity/Trunk Assessment               Exercises     Shoulder Instructions       General Comments      Pertinent Vitals/ Pain       Pain Assessment: Faces Faces Pain Scale: Hurts a little bit Pain Location: RLE Pain Intervention(s): Limited activity within patient's tolerance;Monitored during session;Premedicated before session;Repositioned;Ice applied  Home Living                                          Prior Functioning/Environment              Frequency  Min 2X/week  Progress Toward Goals  OT Goals(current goals can now be found in the care plan section)        Plan      Co-evaluation    PT/OT/SLP Co-Evaluation/Treatment: Yes Reason for Co-Treatment: For patient/therapist safety PT goals addressed during session: Mobility/safety with mobility OT goals addressed during session: ADL's and self-care      End of Session     Activity Tolerance Patient limited by fatigue   Patient Left in chair;with call bell/phone within reach;with family/visitor present;with nursing/sitter in room   Nurse Communication          Time: [id number] OT Time Calculation (min): 15 min  Charges: OT General Charges $OT Visit: 1 Procedure OT Treatments $Therapeutic Activity: 8-22 mins  Aicia Babinski 11/30/2015, 1:24 PM Lesle Chris, OTR/L [phone] 11/30/2015

## 2015-11-30 NOTE — Progress Notes (Signed)
Physical Therapy Treatment Patient Details Name: RANFERI SALMERON MRN: [id number] DOB: [birth year] Today's Date: 11/30/2015    History of Present Illness  82 y.o. male with medical history significant of hypertension, hyperlipidemia, diabetes mellitus, GERD, TIA, stroke with mild right-sided weakness, CAD, s/p of stent placement, BPH, arthritis, CKD-II, who presents with right hip pain after fall; s/p R femur IM nail    PT Comments    Ready to DC to  SNF   Follow Up Recommendations  SNF     Equipment Recommendations  None recommended by PT    Recommendations for Other Services       Precautions / Restrictions Precautions Precautions: Fall Restrictions RLE Weight Bearing: Partial weight bearing RLE Partial Weight Bearing Percentage or Pounds: 30%     Mobility  Bed Mobility Overal bed mobility: +2 for physical assistance;Needs Assistance Bed Mobility: Sit to Supine     Supine to sit: Max assist;+2 for physical assistance Sit to supine: Max assist;+2 for physical assistance;+2 for safety/equipment   General bed mobility comments: assist for trunk and RLE and left leg  Transfers Overall transfer level: Needs assistance Equipment used: Rolling walker (2 wheeled) Transfers: Sit to/from [facility] Sit to Stand: Mod assist;+2 physical assistance;+2 safety/equipment Stand pivot transfers: Mod assist;+2 physical assistance;+2 safety/equipment       General transfer comment: cues for technique. able to take small steps with min weight on the right leg  Ambulation/Gait                 Stairs            Wheelchair Mobility    Modified Rankin (Stroke Patients Only)       Balance Overall balance assessment: Needs assistance Sitting-balance support: Bilateral upper extremity supported Sitting balance-Leahy Scale: Fair Sitting balance - Comments: frequent LOB posteriorly, able to maintain static sir with bil UE support with time      Standing balance-Leahy Scale: Poor                      Cognition Arousal/Alertness: Awake/alert Behavior During Therapy: WFL for tasks assessed/performed Overall Cognitive Status: Within Functional Limits for tasks assessed                      Exercises      General Comments        Pertinent Vitals/Pain Pain Assessment: Faces Faces Pain Scale: Hurts a little bit Pain Location: right leg Pain Descriptors / Indicators: Discomfort Pain Intervention(s): Limited activity within patient's tolerance;Monitored during session;Premedicated before session;Repositioned;Ice applied    Home Living                      Prior Function            PT Goals (current goals can now be found in the care plan section) Progress towards PT goals: Progressing toward goals    Frequency    Min 3X/week      PT Plan Current plan remains appropriate    Co-evaluation PT/OT/SLP Co-Evaluation/Treatment: Yes Reason for Co-Treatment: Complexity of the patient's impairments (multi-system involvement);For patient/therapist safety PT goals addressed during session: Mobility/safety with mobility OT goals addressed during session: ADL's and self-care     End of Session Equipment Utilized During Treatment: Gait belt Activity Tolerance: Patient tolerated treatment well Patient left: in bed;with call bell/phone within reach;with bed alarm set;with family/visitor present     Time: [id number] PT Time Calculation (min) (  ACUTE ONLY): 12 min  Charges:  $Therapeutic Activity: 8-22 mins                    G Codes:      Claretha Cooper 11/30/2015, 4:52 PM

## 2015-11-30 NOTE — Progress Notes (Signed)
Pt discharged to [place] spoke with [facility] for handoff, wife at bedside. Pt stable and denies pain. Blood tranfusion complete and tolerated it well. SRP, RN

## 2015-11-30 NOTE — Progress Notes (Signed)
Patient Profile: 82 y/o male with h/o CAD, HTN, HLD, DM and h/o CVA/ TIA  admitted for fall resulting in right hip Fx. S/p surgical repair. Post operative course complicated by atrial fibrillation w/ RVR.   Subjective: Feels well. Eating breakfast. Family by bedside. No major complaints. Hip pain level improved. Bowels are moving well. He denies melena.   Objective: Vital signs in last 24 hours: Temp:  [97.7 F (36.5 C)-98.6 F (37 C)] 97.7 F (36.5 C) (09/27 1200) Pulse Rate:  [60-106] 106 (09/27 1200) Resp:  [16-20] 18 (09/27 1200) BP: (98-145)/(62-78) 98/62 (09/27 1200) SpO2:  [95 %-98 %] 96 % (09/27 1200) Last BM Date: 11/29/15  Intake/Output from previous day: 09/26 0701 - 09/27 0700 In: 2519.6 [P.O.:960; I.V.:1224.6; Blood:335] Out: 275 [Urine:275] Intake/Output this shift: Total I/O In: -  Out: 350 [Urine:350]  Medications Current Facility-Administered Medications  Medication Dose Route Frequency Provider Last Rate Last Dose  . 0.9 %  sodium chloride infusion   Intravenous Continuous Kinnie Feil, MD 75 mL/hr at 11/29/15 1750    . 0.9 %  sodium chloride infusion   Intravenous Once Jeryl Columbia, NP   Stopped at 11/29/15 [id number]  . 0.9 %  sodium chloride infusion   Intravenous Once Robbie Lis, MD      . acetaminophen (TYLENOL) tablet 650 mg  650 mg Oral Q6H PRN Rod Can, MD   650 mg at 11/30/15 0955   Or  . acetaminophen (TYLENOL) suppository 650 mg  650 mg Rectal Q6H PRN Rod Can, MD      . apixaban Arne Cleveland) tablet 5 mg  5 mg Oral BID Thayer Headings, MD   5 mg at 11/30/15 0956  . atorvastatin (LIPITOR) tablet 80 mg  80 mg Oral Daily Ivor Costa, MD   80 mg at 11/29/15 1709  . docusate sodium (COLACE) capsule 100 mg  100 mg Oral BID Rod Can, MD   100 mg at 11/30/15 0956  . feeding supplement (ENSURE ENLIVE) (ENSURE ENLIVE) liquid 237 mL  237 mL Oral BID BM Theodis Blaze, MD   237 mL at 11/30/15 1000  . ferrous sulfate tablet 325 mg  325  mg Oral TID WC Kinnie Feil, MD   325 mg at 11/30/15 1233  . hydrALAZINE (APRESOLINE) injection 5 mg  5 mg Intravenous Q2H PRN Ivor Costa, MD      . HYDROcodone-acetaminophen (NORCO/VICODIN) 5-325 MG per tablet 1-2 tablet  1-2 tablet Oral Q6H PRN Rod Can, MD   1 tablet at 11/28/15 0912  . insulin aspart (novoLOG) injection 0-5 Units  0-5 Units Subcutaneous QHS Ivor Costa, MD      . insulin aspart (novoLOG) injection 0-9 Units  0-9 Units Subcutaneous TID WC Ivor Costa, MD   2 Units at 11/30/15 1233  . menthol-cetylpyridinium (CEPACOL) lozenge 3 mg  1 lozenge Oral PRN Rod Can, MD       Or  . phenol (CHLORASEPTIC) mouth spray 1 spray  1 spray Mouth/Throat PRN Rod Can, MD      . methocarbamol (ROBAXIN) tablet 500 mg  500 mg Oral Q6H PRN Rod Can, MD       Or  . methocarbamol (ROBAXIN) 500 mg in dextrose 5 % 50 mL IVPB  500 mg Intravenous Q6H PRN Rod Can, MD      . metoCLOPramide (REGLAN) tablet 5-10 mg  5-10 mg Oral Q8H PRN Rod Can, MD       Or  . metoCLOPramide (  REGLAN) injection 5-10 mg  5-10 mg Intravenous Q8H PRN Rod Can, MD      . metoprolol tartrate (LOPRESSOR) tablet 25 mg  25 mg Oral BID Thayer Headings, MD   25 mg at 11/30/15 0957  . morphine 2 MG/ML injection 1 mg  1 mg Intravenous Q4H PRN Theodis Blaze, MD   1 mg at 11/27/15 1004  . multivitamin with minerals tablet 1 tablet  1 tablet Oral Daily Ivor Costa, MD   1 tablet at 11/30/15 0955  . nitroGLYCERIN (NITROSTAT) SL tablet 0.4 mg  0.4 mg Sublingual Q5 min PRN Ivor Costa, MD      . ondansetron [facility]) tablet 4 mg  4 mg Oral Q6H PRN Rod Can, MD       Or  . ondansetron (ZOFRAN) injection 4 mg  4 mg Intravenous Q6H PRN Rod Can, MD      . polyethylene glycol (MIRALAX / GLYCOLAX) packet 17 g  17 g Oral Daily PRN Ivor Costa, MD      . senna [facility]) tablet 8.6 mg  1 tablet Oral BID Rod Can, MD   8.6 mg at 11/30/15 0955  . tamsulosin (FLOMAX) capsule 0.4 mg  0.4 mg Oral  QODAY Ivor Costa, MD   0.4 mg at 11/29/15 1025    PE: General appearance: alert, cooperative and no distress Neck: no carotid bruit and no JVD Lungs: clear to auscultation bilaterally Heart: irregularly irregular rhythm and regular rate Extremities: no LEE Pulses: 2+ and symmetric Skin: warm and dry Neurologic: Grossly normal  Lab Results:   Recent Labs  11/28/15 0338 11/29/15 0409 11/29/15 1240 11/30/15 0502  WBC 9.8 10.6*  --  11.1*  HGB 7.7* 6.9* 8.6* 7.9*  HCT 23.3* 21.1* 25.8* 23.6*  PLT 254 279  --  307   BMET  Recent Labs  11/28/15 0338 11/30/15 0502  NA 132* 130*  K 3.8 3.7  CL 106 104  CO2 21* 20*  GLUCOSE 129* 120*  BUN 15 18  CREATININE 0.96 0.85  CALCIUM 8.0* 7.6*   Cardiac Panel (last 3 results)  Recent Labs  11/27/15 1646  TROPONINI <0.03    Studies/Results: 2D Echo 11/28/15 Study Conclusions  - Left ventricle: The cavity size was normal. Wall thickness was   increased in a pattern of mild LVH. Systolic function was   vigorous. The estimated ejection fraction was in the range of 65%   to 70%. - Right ventricle: The cavity size was mildly dilated.   Assessment/Plan  Principal Problem:   Closed right hip fracture (HCC) Active Problems:   HLD (hyperlipidemia)   Benign essential HTN   CAD in native artery   H/O: CVA (cerebrovascular accident)   Controlled diabetes mellitus type 2 with complications (HCC)   BPH (benign prostatic hyperplasia)   CKD (chronic kidney disease), stage II   Displaced intertrochanteric fracture of right femur, initial encounter for closed fracture (HCC)   Protein-calorie malnutrition, severe   1. Atrial Fibrillation w/ RVR: in the setting of recent orthopedic surgery for treatment of right hip fracture. Also with anemia with Hgb now at 6.9. Suspect rhythm disturbances are secondary to recent surgery, anemia and pain. 2D echo showed vigorous LVF with EF of 65-70%.   Rate is well controlled. Continue  eliquis 5 mg BID   Going to [facility] today  I'll see him in several months   2. Rt. Hip Fracture: 2/2 mechanical fall. S/p intramedullary nail surgery 11/26/2015  3. Anemia: 2/2 hip  fracture + surgery. Hgb continues to trend downward. Getting another transfusion today     4. CAD: s/p remote PCI + DES to RCA at an outside hospital. He denies CP. On statin, Plavix and BB.   5. HTN: controlled.   6. HLD: continue statin therapy with Lipitor.    LOS: 5 days     [name], MD  11/30/2015 1:45 PM    [address],  [place], [place]  [address] Pager [phone] Phone: [phone]; Fax: [phone]

## 2015-11-30 NOTE — Progress Notes (Signed)
Physical Therapy Treatment Patient Details Name: Herbert Marquez MRN: [id number] DOB: 20-Aug-1927 Today's Date: 11/30/2015    History of Present Illness  82 y.o. male with medical history significant of hypertension, hyperlipidemia, diabetes mellitus, GERD, TIA, stroke with mild right-sided weakness, CAD, s/p of stent placement, BPH, arthritis, CKD-II, who presents with right hip pain after fall; s/p R femur IM nail    PT Comments    The patient was motivated to mobilize to recliner with 2 person assist. Plans SNF today.  Follow Up Recommendations  SNF     Equipment Recommendations  None recommended by PT    Recommendations for Other Services       Precautions / Restrictions Precautions Precautions: Fall Restrictions RLE Weight Bearing: Partial weight bearing RLE Partial Weight Bearing Percentage or Pounds: 30%     Mobility  Bed Mobility Overal bed mobility: +2 for physical assistance;Needs Assistance Bed Mobility: Supine to Sit;Sit to Supine     Supine to sit: Max assist;+2 for physical assistance     General bed mobility comments: assist for trunk and RLE  Transfers   Equipment used: Rolling walker (2 wheeled) Transfers: Sit to/from Stand Sit to Stand: Mod assist;+2 physical assistance;+2 safety/equipment         General transfer comment: cues for UE placement and RLE placement.  Assistance to power up and stabilize. Encouraged heel/toe to pivot to chair, and therapist supported RLE to ensure compliance with WB.  Pt very fatiqued after getting to chair  Ambulation/Gait                 Stairs            Wheelchair Mobility    Modified Rankin (Stroke Patients Only)       Balance Overall balance assessment: Needs assistance Sitting-balance support: Bilateral upper extremity supported Sitting balance-Leahy Scale: Fair Sitting balance - Comments: frequent LOB posteriorly, able to maintain static sir with bil UE support with time      Standing balance-Leahy Scale: Poor                      Cognition Arousal/Alertness: Awake/alert Behavior During Therapy: WFL for tasks assessed/performed Overall Cognitive Status: Within Functional Limits for tasks assessed                      Exercises      General Comments        Pertinent Vitals/Pain Pain Assessment: Faces Faces Pain Scale: Hurts a little bit Pain Location: RLE Pain Descriptors / Indicators: Discomfort;Grimacing Pain Intervention(s): Limited activity within patient's tolerance;Monitored during session;Premedicated before session;Repositioned;Ice applied    Home Living                      Prior Function            PT Goals (current goals can now be found in the care plan section) Progress towards PT goals: Progressing toward goals    Frequency    Min 3X/week      PT Plan Current plan remains appropriate    Co-evaluation PT/OT/SLP Co-Evaluation/Treatment: Yes Reason for Co-Treatment: Complexity of the patient's impairments (multi-system involvement);For patient/therapist safety PT goals addressed during session: Mobility/safety with mobility OT goals addressed during session: ADL's and self-care     End of Session Equipment Utilized During Treatment: Gait belt         Time: 1132-1150 PT Time Calculation (min) (ACUTE ONLY): 18 min  Charges:  G Codes:      Claretha Cooper 11/30/2015, 2:00 PM

## 2015-12-01 LAB — TYPE AND SCREEN
ABO/RH(D): A POS
Antibody Screen: NEGATIVE
Unit division: 0
Unit division: 0

## 2015-12-04 DIAGNOSIS — D72829 Elevated white blood cell count, unspecified: Secondary | ICD-10-CM | POA: Diagnosis not present

## 2015-12-09 DIAGNOSIS — S72101D Unspecified trochanteric fracture of right femur, subsequent encounter for closed fracture with routine healing: Secondary | ICD-10-CM | POA: Diagnosis not present

## 2015-12-20 ENCOUNTER — Ambulatory Visit: Payer: Medicare Other | Admitting: Neurology

## 2015-12-25 DIAGNOSIS — N390 Urinary tract infection, site not specified: Secondary | ICD-10-CM | POA: Diagnosis not present

## 2015-12-25 DIAGNOSIS — G459 Transient cerebral ischemic attack, unspecified: Secondary | ICD-10-CM | POA: Diagnosis not present

## 2015-12-25 DIAGNOSIS — R7989 Other specified abnormal findings of blood chemistry: Secondary | ICD-10-CM | POA: Diagnosis not present

## 2015-12-25 DIAGNOSIS — E119 Type 2 diabetes mellitus without complications: Secondary | ICD-10-CM | POA: Diagnosis not present

## 2015-12-28 DIAGNOSIS — N400 Enlarged prostate without lower urinary tract symptoms: Secondary | ICD-10-CM | POA: Diagnosis not present

## 2015-12-28 DIAGNOSIS — N3281 Overactive bladder: Secondary | ICD-10-CM | POA: Diagnosis not present

## 2016-01-07 DIAGNOSIS — E1121 Type 2 diabetes mellitus with diabetic nephropathy: Secondary | ICD-10-CM | POA: Diagnosis not present

## 2016-01-07 DIAGNOSIS — Z8673 Personal history of transient ischemic attack (TIA), and cerebral infarction without residual deficits: Secondary | ICD-10-CM | POA: Diagnosis not present

## 2016-01-07 DIAGNOSIS — N182 Chronic kidney disease, stage 2 (mild): Secondary | ICD-10-CM | POA: Diagnosis not present

## 2016-01-07 DIAGNOSIS — S7221XD Displaced subtrochanteric fracture of right femur, subsequent encounter for closed fracture with routine healing: Secondary | ICD-10-CM | POA: Diagnosis not present

## 2016-01-07 DIAGNOSIS — E119 Type 2 diabetes mellitus without complications: Secondary | ICD-10-CM | POA: Diagnosis not present

## 2016-01-07 DIAGNOSIS — M1711 Unilateral primary osteoarthritis, right knee: Secondary | ICD-10-CM | POA: Diagnosis not present

## 2016-01-07 DIAGNOSIS — I4891 Unspecified atrial fibrillation: Secondary | ICD-10-CM | POA: Diagnosis not present

## 2016-01-07 DIAGNOSIS — R296 Repeated falls: Secondary | ICD-10-CM | POA: Diagnosis not present

## 2016-01-07 DIAGNOSIS — E43 Unspecified severe protein-calorie malnutrition: Secondary | ICD-10-CM | POA: Diagnosis not present

## 2016-01-07 DIAGNOSIS — E1151 Type 2 diabetes mellitus with diabetic peripheral angiopathy without gangrene: Secondary | ICD-10-CM | POA: Diagnosis not present

## 2016-01-07 DIAGNOSIS — E1122 Type 2 diabetes mellitus with diabetic chronic kidney disease: Secondary | ICD-10-CM | POA: Diagnosis not present

## 2016-01-07 DIAGNOSIS — E785 Hyperlipidemia, unspecified: Secondary | ICD-10-CM | POA: Diagnosis not present

## 2016-01-07 DIAGNOSIS — I129 Hypertensive chronic kidney disease with stage 1 through stage 4 chronic kidney disease, or unspecified chronic kidney disease: Secondary | ICD-10-CM | POA: Diagnosis not present

## 2016-01-07 DIAGNOSIS — I69351 Hemiplegia and hemiparesis following cerebral infarction affecting right dominant side: Secondary | ICD-10-CM | POA: Diagnosis not present

## 2016-01-07 DIAGNOSIS — S72001A Fracture of unspecified part of neck of right femur, initial encounter for closed fracture: Secondary | ICD-10-CM | POA: Diagnosis not present

## 2016-01-07 DIAGNOSIS — I2510 Atherosclerotic heart disease of native coronary artery without angina pectoris: Secondary | ICD-10-CM | POA: Diagnosis not present

## 2016-01-07 DIAGNOSIS — M21371 Foot drop, right foot: Secondary | ICD-10-CM | POA: Diagnosis not present

## 2016-01-07 DIAGNOSIS — S72141D Displaced intertrochanteric fracture of right femur, subsequent encounter for closed fracture with routine healing: Secondary | ICD-10-CM | POA: Diagnosis not present

## 2016-01-07 DIAGNOSIS — Z955 Presence of coronary angioplasty implant and graft: Secondary | ICD-10-CM | POA: Diagnosis not present

## 2016-01-10 DIAGNOSIS — M25469 Effusion, unspecified knee: Secondary | ICD-10-CM | POA: Diagnosis not present

## 2016-01-10 DIAGNOSIS — S72101D Unspecified trochanteric fracture of right femur, subsequent encounter for closed fracture with routine healing: Secondary | ICD-10-CM | POA: Diagnosis not present

## 2016-01-10 DIAGNOSIS — M1711 Unilateral primary osteoarthritis, right knee: Secondary | ICD-10-CM | POA: Diagnosis not present

## 2016-01-10 DIAGNOSIS — M25461 Effusion, right knee: Secondary | ICD-10-CM | POA: Diagnosis not present

## 2016-01-11 DIAGNOSIS — N182 Chronic kidney disease, stage 2 (mild): Secondary | ICD-10-CM | POA: Diagnosis not present

## 2016-01-11 DIAGNOSIS — I2510 Atherosclerotic heart disease of native coronary artery without angina pectoris: Secondary | ICD-10-CM | POA: Diagnosis not present

## 2016-01-11 DIAGNOSIS — S72141D Displaced intertrochanteric fracture of right femur, subsequent encounter for closed fracture with routine healing: Secondary | ICD-10-CM | POA: Diagnosis not present

## 2016-01-11 DIAGNOSIS — E1122 Type 2 diabetes mellitus with diabetic chronic kidney disease: Secondary | ICD-10-CM | POA: Diagnosis not present

## 2016-01-11 DIAGNOSIS — I129 Hypertensive chronic kidney disease with stage 1 through stage 4 chronic kidney disease, or unspecified chronic kidney disease: Secondary | ICD-10-CM | POA: Diagnosis not present

## 2016-01-11 DIAGNOSIS — S7221XD Displaced subtrochanteric fracture of right femur, subsequent encounter for closed fracture with routine healing: Secondary | ICD-10-CM | POA: Diagnosis not present

## 2016-01-12 DIAGNOSIS — I129 Hypertensive chronic kidney disease with stage 1 through stage 4 chronic kidney disease, or unspecified chronic kidney disease: Secondary | ICD-10-CM | POA: Diagnosis not present

## 2016-01-12 DIAGNOSIS — N182 Chronic kidney disease, stage 2 (mild): Secondary | ICD-10-CM | POA: Diagnosis not present

## 2016-01-12 DIAGNOSIS — E1122 Type 2 diabetes mellitus with diabetic chronic kidney disease: Secondary | ICD-10-CM | POA: Diagnosis not present

## 2016-01-12 DIAGNOSIS — S7221XD Displaced subtrochanteric fracture of right femur, subsequent encounter for closed fracture with routine healing: Secondary | ICD-10-CM | POA: Diagnosis not present

## 2016-01-12 DIAGNOSIS — I2510 Atherosclerotic heart disease of native coronary artery without angina pectoris: Secondary | ICD-10-CM | POA: Diagnosis not present

## 2016-01-12 DIAGNOSIS — S72141D Displaced intertrochanteric fracture of right femur, subsequent encounter for closed fracture with routine healing: Secondary | ICD-10-CM | POA: Diagnosis not present

## 2016-01-16 DIAGNOSIS — I2510 Atherosclerotic heart disease of native coronary artery without angina pectoris: Secondary | ICD-10-CM | POA: Diagnosis not present

## 2016-01-16 DIAGNOSIS — S72141D Displaced intertrochanteric fracture of right femur, subsequent encounter for closed fracture with routine healing: Secondary | ICD-10-CM | POA: Diagnosis not present

## 2016-01-16 DIAGNOSIS — I129 Hypertensive chronic kidney disease with stage 1 through stage 4 chronic kidney disease, or unspecified chronic kidney disease: Secondary | ICD-10-CM | POA: Diagnosis not present

## 2016-01-16 DIAGNOSIS — E1122 Type 2 diabetes mellitus with diabetic chronic kidney disease: Secondary | ICD-10-CM | POA: Diagnosis not present

## 2016-01-16 DIAGNOSIS — N182 Chronic kidney disease, stage 2 (mild): Secondary | ICD-10-CM | POA: Diagnosis not present

## 2016-01-16 DIAGNOSIS — S7221XD Displaced subtrochanteric fracture of right femur, subsequent encounter for closed fracture with routine healing: Secondary | ICD-10-CM | POA: Diagnosis not present

## 2016-01-17 DIAGNOSIS — S72141D Displaced intertrochanteric fracture of right femur, subsequent encounter for closed fracture with routine healing: Secondary | ICD-10-CM | POA: Diagnosis not present

## 2016-01-17 DIAGNOSIS — I129 Hypertensive chronic kidney disease with stage 1 through stage 4 chronic kidney disease, or unspecified chronic kidney disease: Secondary | ICD-10-CM | POA: Diagnosis not present

## 2016-01-17 DIAGNOSIS — I2510 Atherosclerotic heart disease of native coronary artery without angina pectoris: Secondary | ICD-10-CM | POA: Diagnosis not present

## 2016-01-17 DIAGNOSIS — S7221XD Displaced subtrochanteric fracture of right femur, subsequent encounter for closed fracture with routine healing: Secondary | ICD-10-CM | POA: Diagnosis not present

## 2016-01-17 DIAGNOSIS — N182 Chronic kidney disease, stage 2 (mild): Secondary | ICD-10-CM | POA: Diagnosis not present

## 2016-01-17 DIAGNOSIS — E1122 Type 2 diabetes mellitus with diabetic chronic kidney disease: Secondary | ICD-10-CM | POA: Diagnosis not present

## 2016-01-19 DIAGNOSIS — S7221XD Displaced subtrochanteric fracture of right femur, subsequent encounter for closed fracture with routine healing: Secondary | ICD-10-CM | POA: Diagnosis not present

## 2016-01-19 DIAGNOSIS — S72141D Displaced intertrochanteric fracture of right femur, subsequent encounter for closed fracture with routine healing: Secondary | ICD-10-CM | POA: Diagnosis not present

## 2016-01-19 DIAGNOSIS — N182 Chronic kidney disease, stage 2 (mild): Secondary | ICD-10-CM | POA: Diagnosis not present

## 2016-01-19 DIAGNOSIS — I129 Hypertensive chronic kidney disease with stage 1 through stage 4 chronic kidney disease, or unspecified chronic kidney disease: Secondary | ICD-10-CM | POA: Diagnosis not present

## 2016-01-19 DIAGNOSIS — I2510 Atherosclerotic heart disease of native coronary artery without angina pectoris: Secondary | ICD-10-CM | POA: Diagnosis not present

## 2016-01-19 DIAGNOSIS — E1122 Type 2 diabetes mellitus with diabetic chronic kidney disease: Secondary | ICD-10-CM | POA: Diagnosis not present

## 2016-01-22 DIAGNOSIS — E1122 Type 2 diabetes mellitus with diabetic chronic kidney disease: Secondary | ICD-10-CM | POA: Diagnosis not present

## 2016-01-22 DIAGNOSIS — S72141D Displaced intertrochanteric fracture of right femur, subsequent encounter for closed fracture with routine healing: Secondary | ICD-10-CM | POA: Diagnosis not present

## 2016-01-22 DIAGNOSIS — N182 Chronic kidney disease, stage 2 (mild): Secondary | ICD-10-CM | POA: Diagnosis not present

## 2016-01-22 DIAGNOSIS — S7221XD Displaced subtrochanteric fracture of right femur, subsequent encounter for closed fracture with routine healing: Secondary | ICD-10-CM | POA: Diagnosis not present

## 2016-01-22 DIAGNOSIS — I129 Hypertensive chronic kidney disease with stage 1 through stage 4 chronic kidney disease, or unspecified chronic kidney disease: Secondary | ICD-10-CM | POA: Diagnosis not present

## 2016-01-22 DIAGNOSIS — I2510 Atherosclerotic heart disease of native coronary artery without angina pectoris: Secondary | ICD-10-CM | POA: Diagnosis not present

## 2016-01-23 DIAGNOSIS — I129 Hypertensive chronic kidney disease with stage 1 through stage 4 chronic kidney disease, or unspecified chronic kidney disease: Secondary | ICD-10-CM | POA: Diagnosis not present

## 2016-01-23 DIAGNOSIS — E1122 Type 2 diabetes mellitus with diabetic chronic kidney disease: Secondary | ICD-10-CM | POA: Diagnosis not present

## 2016-01-23 DIAGNOSIS — I2510 Atherosclerotic heart disease of native coronary artery without angina pectoris: Secondary | ICD-10-CM | POA: Diagnosis not present

## 2016-01-23 DIAGNOSIS — S72141D Displaced intertrochanteric fracture of right femur, subsequent encounter for closed fracture with routine healing: Secondary | ICD-10-CM | POA: Diagnosis not present

## 2016-01-23 DIAGNOSIS — S7221XD Displaced subtrochanteric fracture of right femur, subsequent encounter for closed fracture with routine healing: Secondary | ICD-10-CM | POA: Diagnosis not present

## 2016-01-23 DIAGNOSIS — N182 Chronic kidney disease, stage 2 (mild): Secondary | ICD-10-CM | POA: Diagnosis not present

## 2016-01-24 DIAGNOSIS — D649 Anemia, unspecified: Secondary | ICD-10-CM | POA: Diagnosis not present

## 2016-01-24 DIAGNOSIS — E1122 Type 2 diabetes mellitus with diabetic chronic kidney disease: Secondary | ICD-10-CM | POA: Diagnosis not present

## 2016-01-30 DIAGNOSIS — I129 Hypertensive chronic kidney disease with stage 1 through stage 4 chronic kidney disease, or unspecified chronic kidney disease: Secondary | ICD-10-CM | POA: Diagnosis not present

## 2016-01-30 DIAGNOSIS — S7221XD Displaced subtrochanteric fracture of right femur, subsequent encounter for closed fracture with routine healing: Secondary | ICD-10-CM | POA: Diagnosis not present

## 2016-01-30 DIAGNOSIS — N182 Chronic kidney disease, stage 2 (mild): Secondary | ICD-10-CM | POA: Diagnosis not present

## 2016-01-30 DIAGNOSIS — I2510 Atherosclerotic heart disease of native coronary artery without angina pectoris: Secondary | ICD-10-CM | POA: Diagnosis not present

## 2016-01-30 DIAGNOSIS — E1122 Type 2 diabetes mellitus with diabetic chronic kidney disease: Secondary | ICD-10-CM | POA: Diagnosis not present

## 2016-01-30 DIAGNOSIS — S72141D Displaced intertrochanteric fracture of right femur, subsequent encounter for closed fracture with routine healing: Secondary | ICD-10-CM | POA: Diagnosis not present

## 2016-01-31 DIAGNOSIS — H903 Sensorineural hearing loss, bilateral: Secondary | ICD-10-CM | POA: Diagnosis not present

## 2016-02-02 DIAGNOSIS — I129 Hypertensive chronic kidney disease with stage 1 through stage 4 chronic kidney disease, or unspecified chronic kidney disease: Secondary | ICD-10-CM | POA: Diagnosis not present

## 2016-02-02 DIAGNOSIS — S72141D Displaced intertrochanteric fracture of right femur, subsequent encounter for closed fracture with routine healing: Secondary | ICD-10-CM | POA: Diagnosis not present

## 2016-02-02 DIAGNOSIS — E1122 Type 2 diabetes mellitus with diabetic chronic kidney disease: Secondary | ICD-10-CM | POA: Diagnosis not present

## 2016-02-02 DIAGNOSIS — N182 Chronic kidney disease, stage 2 (mild): Secondary | ICD-10-CM | POA: Diagnosis not present

## 2016-02-02 DIAGNOSIS — S7221XD Displaced subtrochanteric fracture of right femur, subsequent encounter for closed fracture with routine healing: Secondary | ICD-10-CM | POA: Diagnosis not present

## 2016-02-02 DIAGNOSIS — I2510 Atherosclerotic heart disease of native coronary artery without angina pectoris: Secondary | ICD-10-CM | POA: Diagnosis not present

## 2016-02-06 DIAGNOSIS — I2510 Atherosclerotic heart disease of native coronary artery without angina pectoris: Secondary | ICD-10-CM | POA: Diagnosis not present

## 2016-02-06 DIAGNOSIS — E1122 Type 2 diabetes mellitus with diabetic chronic kidney disease: Secondary | ICD-10-CM | POA: Diagnosis not present

## 2016-02-06 DIAGNOSIS — N182 Chronic kidney disease, stage 2 (mild): Secondary | ICD-10-CM | POA: Diagnosis not present

## 2016-02-06 DIAGNOSIS — S7221XD Displaced subtrochanteric fracture of right femur, subsequent encounter for closed fracture with routine healing: Secondary | ICD-10-CM | POA: Diagnosis not present

## 2016-02-06 DIAGNOSIS — I129 Hypertensive chronic kidney disease with stage 1 through stage 4 chronic kidney disease, or unspecified chronic kidney disease: Secondary | ICD-10-CM | POA: Diagnosis not present

## 2016-02-06 DIAGNOSIS — S72141D Displaced intertrochanteric fracture of right femur, subsequent encounter for closed fracture with routine healing: Secondary | ICD-10-CM | POA: Diagnosis not present

## 2016-02-09 DIAGNOSIS — S72141D Displaced intertrochanteric fracture of right femur, subsequent encounter for closed fracture with routine healing: Secondary | ICD-10-CM | POA: Diagnosis not present

## 2016-02-09 DIAGNOSIS — I2510 Atherosclerotic heart disease of native coronary artery without angina pectoris: Secondary | ICD-10-CM | POA: Diagnosis not present

## 2016-02-09 DIAGNOSIS — I129 Hypertensive chronic kidney disease with stage 1 through stage 4 chronic kidney disease, or unspecified chronic kidney disease: Secondary | ICD-10-CM | POA: Diagnosis not present

## 2016-02-09 DIAGNOSIS — N182 Chronic kidney disease, stage 2 (mild): Secondary | ICD-10-CM | POA: Diagnosis not present

## 2016-02-09 DIAGNOSIS — E1122 Type 2 diabetes mellitus with diabetic chronic kidney disease: Secondary | ICD-10-CM | POA: Diagnosis not present

## 2016-02-09 DIAGNOSIS — S7221XD Displaced subtrochanteric fracture of right femur, subsequent encounter for closed fracture with routine healing: Secondary | ICD-10-CM | POA: Diagnosis not present

## 2016-02-13 DIAGNOSIS — E1122 Type 2 diabetes mellitus with diabetic chronic kidney disease: Secondary | ICD-10-CM | POA: Diagnosis not present

## 2016-02-13 DIAGNOSIS — S7221XD Displaced subtrochanteric fracture of right femur, subsequent encounter for closed fracture with routine healing: Secondary | ICD-10-CM | POA: Diagnosis not present

## 2016-02-13 DIAGNOSIS — N182 Chronic kidney disease, stage 2 (mild): Secondary | ICD-10-CM | POA: Diagnosis not present

## 2016-02-13 DIAGNOSIS — S72141D Displaced intertrochanteric fracture of right femur, subsequent encounter for closed fracture with routine healing: Secondary | ICD-10-CM | POA: Diagnosis not present

## 2016-02-13 DIAGNOSIS — I2510 Atherosclerotic heart disease of native coronary artery without angina pectoris: Secondary | ICD-10-CM | POA: Diagnosis not present

## 2016-02-13 DIAGNOSIS — I129 Hypertensive chronic kidney disease with stage 1 through stage 4 chronic kidney disease, or unspecified chronic kidney disease: Secondary | ICD-10-CM | POA: Diagnosis not present

## 2016-02-16 DIAGNOSIS — E1122 Type 2 diabetes mellitus with diabetic chronic kidney disease: Secondary | ICD-10-CM | POA: Diagnosis not present

## 2016-02-16 DIAGNOSIS — N182 Chronic kidney disease, stage 2 (mild): Secondary | ICD-10-CM | POA: Diagnosis not present

## 2016-02-16 DIAGNOSIS — S7221XD Displaced subtrochanteric fracture of right femur, subsequent encounter for closed fracture with routine healing: Secondary | ICD-10-CM | POA: Diagnosis not present

## 2016-02-16 DIAGNOSIS — S72141D Displaced intertrochanteric fracture of right femur, subsequent encounter for closed fracture with routine healing: Secondary | ICD-10-CM | POA: Diagnosis not present

## 2016-02-16 DIAGNOSIS — I129 Hypertensive chronic kidney disease with stage 1 through stage 4 chronic kidney disease, or unspecified chronic kidney disease: Secondary | ICD-10-CM | POA: Diagnosis not present

## 2016-02-16 DIAGNOSIS — I2510 Atherosclerotic heart disease of native coronary artery without angina pectoris: Secondary | ICD-10-CM | POA: Diagnosis not present

## 2016-02-21 DIAGNOSIS — S72101D Unspecified trochanteric fracture of right femur, subsequent encounter for closed fracture with routine healing: Secondary | ICD-10-CM | POA: Diagnosis not present

## 2016-02-23 DIAGNOSIS — I129 Hypertensive chronic kidney disease with stage 1 through stage 4 chronic kidney disease, or unspecified chronic kidney disease: Secondary | ICD-10-CM | POA: Diagnosis not present

## 2016-02-23 DIAGNOSIS — S72141D Displaced intertrochanteric fracture of right femur, subsequent encounter for closed fracture with routine healing: Secondary | ICD-10-CM | POA: Diagnosis not present

## 2016-02-23 DIAGNOSIS — S7221XD Displaced subtrochanteric fracture of right femur, subsequent encounter for closed fracture with routine healing: Secondary | ICD-10-CM | POA: Diagnosis not present

## 2016-02-23 DIAGNOSIS — I2510 Atherosclerotic heart disease of native coronary artery without angina pectoris: Secondary | ICD-10-CM | POA: Diagnosis not present

## 2016-02-23 DIAGNOSIS — N182 Chronic kidney disease, stage 2 (mild): Secondary | ICD-10-CM | POA: Diagnosis not present

## 2016-02-23 DIAGNOSIS — E1122 Type 2 diabetes mellitus with diabetic chronic kidney disease: Secondary | ICD-10-CM | POA: Diagnosis not present

## 2016-03-01 DIAGNOSIS — I129 Hypertensive chronic kidney disease with stage 1 through stage 4 chronic kidney disease, or unspecified chronic kidney disease: Secondary | ICD-10-CM | POA: Diagnosis not present

## 2016-03-01 DIAGNOSIS — N182 Chronic kidney disease, stage 2 (mild): Secondary | ICD-10-CM | POA: Diagnosis not present

## 2016-03-01 DIAGNOSIS — I2510 Atherosclerotic heart disease of native coronary artery without angina pectoris: Secondary | ICD-10-CM | POA: Diagnosis not present

## 2016-03-01 DIAGNOSIS — E1122 Type 2 diabetes mellitus with diabetic chronic kidney disease: Secondary | ICD-10-CM | POA: Diagnosis not present

## 2016-03-01 DIAGNOSIS — S7221XD Displaced subtrochanteric fracture of right femur, subsequent encounter for closed fracture with routine healing: Secondary | ICD-10-CM | POA: Diagnosis not present

## 2016-03-01 DIAGNOSIS — S72141D Displaced intertrochanteric fracture of right femur, subsequent encounter for closed fracture with routine healing: Secondary | ICD-10-CM | POA: Diagnosis not present

## 2016-03-15 DIAGNOSIS — R531 Weakness: Secondary | ICD-10-CM | POA: Diagnosis not present

## 2016-03-15 DIAGNOSIS — M25551 Pain in right hip: Secondary | ICD-10-CM | POA: Diagnosis not present

## 2016-03-19 DIAGNOSIS — R531 Weakness: Secondary | ICD-10-CM | POA: Diagnosis not present

## 2016-03-19 DIAGNOSIS — M25551 Pain in right hip: Secondary | ICD-10-CM | POA: Diagnosis not present

## 2016-03-20 ENCOUNTER — Encounter: Payer: Self-pay | Admitting: Adult Health

## 2016-03-20 ENCOUNTER — Ambulatory Visit (INDEPENDENT_AMBULATORY_CARE_PROVIDER_SITE_OTHER): Payer: Medicare Other | Admitting: Adult Health

## 2016-03-20 VITALS — BP 153/98 | HR 63 | Ht 67.0 in | Wt 170.0 lb

## 2016-03-20 DIAGNOSIS — I2510 Atherosclerotic heart disease of native coronary artery without angina pectoris: Secondary | ICD-10-CM

## 2016-03-20 DIAGNOSIS — I4819 Other persistent atrial fibrillation: Secondary | ICD-10-CM

## 2016-03-20 DIAGNOSIS — R269 Unspecified abnormalities of gait and mobility: Secondary | ICD-10-CM

## 2016-03-20 DIAGNOSIS — I69398 Other sequelae of cerebral infarction: Secondary | ICD-10-CM

## 2016-03-20 DIAGNOSIS — K219 Gastro-esophageal reflux disease without esophagitis: Secondary | ICD-10-CM

## 2016-03-20 DIAGNOSIS — I481 Persistent atrial fibrillation: Secondary | ICD-10-CM | POA: Diagnosis not present

## 2016-03-20 DIAGNOSIS — I10 Essential (primary) hypertension: Secondary | ICD-10-CM | POA: Diagnosis not present

## 2016-03-20 DIAGNOSIS — F411 Generalized anxiety disorder: Secondary | ICD-10-CM

## 2016-03-20 DIAGNOSIS — N400 Enlarged prostate without lower urinary tract symptoms: Secondary | ICD-10-CM

## 2016-03-20 MED ORDER — CITALOPRAM HYDROBROMIDE 10 MG PO TABS: 10.0000 mg | ORAL_TABLET | Freq: Every day | ORAL | 0 refills | Status: DC

## 2016-03-20 NOTE — Progress Notes (Signed)
   Subjective:    Patient ID: Herbert Marquez, male    DOB: [birth year], 83 y.o.   MRN: [id number]  HPI: Herbert Marquez presents to establish as a new pt.  2017-he suffered CVA, bladder infection, and right hip/femur fx. He is undergoing formal PT several times weekly and also performing home PT as well. He feels "well generally" with only two acute issues:  1) increased nocturia 2) early morning, intense anxiety.  He denies hx of PTDS or any acute incident/event that could have caused the am anxiety to begin about 5 weeks ago. He lives with his wife of 70 years and reports a strong support system of local children and grandchildren.      Review of Systems  Constitutional: Positive for activity change and fatigue. Negative for appetite change, chills, diaphoresis, fever and unexpected weight change.  HENT: Negative for congestion, postnasal drip and trouble swallowing.   Eyes: Negative for visual disturbance.  Respiratory: Negative for cough, choking, chest tightness, shortness of breath and wheezing.   Cardiovascular: Positive for leg swelling. Negative for chest pain and palpitations.  Gastrointestinal: Negative for abdominal distention, abdominal pain, constipation, diarrhea, nausea and vomiting.  Endocrine: Negative for cold intolerance, heat intolerance, polydipsia, polyphagia and polyuria.  Genitourinary: Positive for frequency. Negative for difficulty urinating, dysuria, enuresis and flank pain.  Musculoskeletal: Positive for arthralgias, gait problem, joint swelling and myalgias.  Skin: Negative for color change and pallor.  Neurological: Positive for tremors. Negative for dizziness, syncope, speech difficulty, weakness and numbness.  Psychiatric/Behavioral: Positive for sleep disturbance. Negative for agitation, behavioral problems and confusion. The patient is nervous/anxious.        Objective:   Physical Exam  Constitutional: He is oriented to person, place, and time. He appears  well-developed and well-nourished. No distress.  HENT:  Head: Normocephalic and atraumatic.  Eyes: Conjunctivae and EOM are normal. Pupils are equal, round, and reactive to light.  Bil hearing aids. Bil hearing loss.  Neck: Normal range of motion. Neck supple. No tracheal deviation present.  Cardiovascular: Intact distal pulses.  Exam reveals no gallop and no friction rub.   Irregular rate and rhyhm  Pulmonary/Chest: Effort normal and breath sounds normal. No respiratory distress. He has no wheezes. He has no rales. He exhibits no tenderness.  Abdominal: Soft. Bowel sounds are normal.  Musculoskeletal: He exhibits tenderness and deformity.  Right wrist ganglion cyst Right hip-tender to touch. Right knee-edema with tenderness to touch. Right ankle-edema.  Lymphadenopathy:    He has no cervical adenopathy.  Neurological: He is alert and oriented to person, place, and time. He has normal reflexes.  Fine tremor noted in RUE  Skin: Skin is warm and dry. He is not diaphoretic.  Psychiatric: He has a normal mood and affect. His behavior is normal. Judgment and thought content normal.          Assessment & Plan:  1.  HTN, 2. CAD, 3. Atrial Fib-Continue medications and regular follow-up with Cardiologist. 4.  GERD-Continue healthy eating. 5.  BPH-Continue medication and Urologist appt. 03/2016. 6.  Gait Disturbances-Continue with formal/at home PT.  Follow-up with Orthopedic surgeon as directed. 7. GAD-Celexa Rx sent to pharmacy, take as directed. Please return in 4 weeks for re-evaluation or sooner if needed. All questions/concerns answered.  Herbert Marquez d. Arvil Persons, NP-C

## 2016-03-20 NOTE — Patient Instructions (Signed)
Hypertension Hypertension is another name for high blood pressure. High blood pressure forces your heart to work harder to pump blood. A blood pressure reading has two numbers, which includes a higher number over a lower number (example: 110/72). Follow these instructions at home:  Have your blood pressure rechecked by your doctor.  Only take medicine as told by your doctor. Follow the directions carefully. The medicine does not work as well if you skip doses. Skipping doses also puts you at risk for problems.  Do not smoke.  Monitor your blood pressure at home as told by your doctor. Contact a doctor if:  You think you are having a reaction to the medicine you are taking.  You have repeat headaches or feel dizzy.  You have puffiness (swelling) in your ankles.  You have trouble with your vision. Get help right away if:  You get a very bad headache and are confused.  You feel weak, numb, or faint.  You get chest or belly (abdominal) pain.  You throw up (vomit).  You cannot breathe very well. This information is not intended to replace advice given to you by your health care provider. Make sure you discuss any questions you have with your health care provider. Document Released: 08/08/2007 Document Revised: 07/28/2015 Document Reviewed: 12/12/2012 Elsevier Interactive Patient Education  2017 [employer].   Atrial Fibrillation Introduction Atrial fibrillation is a type of heartbeat that is irregular or fast (rapid). If you have this condition, your heart keeps quivering in a weird (chaotic) way. This condition can make it so your heart cannot pump blood normally. Having this condition gives a person more risk for stroke, heart failure, and other heart problems. There are different types of atrial fibrillation. Talk with your doctor to learn about the type that you have. Follow these instructions at home:  Take over-the-counter and prescription medicines only as told by your  doctor.  If your doctor prescribed a blood-thinning medicine, take it exactly as told. Taking too much of it can cause bleeding. If you do not take enough of it, you will not have the protection that you need against stroke and other problems.  Do not use any tobacco products. These include cigarettes, chewing tobacco, and e-cigarettes. If you need help quitting, ask your doctor.  If you have apnea (obstructive sleep apnea), manage it as told by your doctor.  Do not drink alcohol.  Do not drink beverages that have caffeine. These include coffee, soda, and tea.  Maintain a healthy weight. Do not use diet pills unless your doctor says they are safe for you. Diet pills may make heart problems worse.  Follow diet instructions as told by your doctor.  Exercise regularly as told by your doctor.  Keep all follow-up visits as told by your doctor. This is important. Contact a doctor if:  You notice a change in the speed, rhythm, or strength of your heartbeat.  You are taking a blood-thinning medicine and you notice more bruising.  You get tired more easily when you move or exercise. Get help right away if:  You have pain in your chest or your belly (abdomen).  You have sweating or weakness.  You feel sick to your stomach (nauseous).  You notice blood in your throw up (vomit), poop (stool), or pee (urine).  You are short of breath.  You suddenly have swollen feet and ankles.  You feel dizzy.  Your suddenly get weak or numb in your face, arms, or legs, especially if  it happens on one side of your body.  You have trouble talking, trouble understanding, or both.  Your face or your eyelid droops on one side. These symptoms may be an emergency. Do not wait to see if the symptoms will go away. Get medical help right away. Call your local emergency services (911 in the [place] Do not drive yourself to the hospital.  This information is not intended to replace advice given to you by your  health care provider. Make sure you discuss any questions you have with your health care provider. Document Released: 11/29/2007 Document Revised: 07/28/2015 Document Reviewed: 06/16/2014  2017 [employer]    Benign Prostatic Hypertrophy The prostate gland is part of the reproductive system of men. A normal prostate is about the size and shape of a walnut. The prostate gland produces a fluid that is mixed with sperm to make semen. This gland surrounds the urethra and is located in front of the rectum and just below the bladder. The bladder is where urine is stored. The urethra is the tube through which urine passes from the bladder to get out of the body. The prostate grows as a man ages. An enlarged prostate not caused by cancer is called benign prostatic hypertrophy (BPH). An enlarged prostate can press on the urethra. This can make it harder to pass urine. In the early stages of enlargement, the bladder can get by with a narrowed urethra by forcing the urine through. If the problem gets worse, medical or surgical treatment may be required. This condition should be followed by your health care provider. The accumulation of urine in the bladder can cause infection. Back pressure and infection can progress to bladder damage and kidney (renal) failure. If needed, your health care provider may refer you to a specialist in kidney and prostate disease (urologist). What are the causes? BPH is a common health problem in men older than 50 years. This condition is a normal part of aging. However, not all men will develop problems from this condition. If the enlargement grows away from the urethra, then there will not be any compression of the urethra and resistance to urine flow.If the growth is toward the urethra and compresses it, you will experience difficulty urinating. What are the signs or symptoms?  Not able to completely empty your bladder.  Getting up often during the night to urinate.  Need to  urinate frequently during the day.  Difficultly starting urine flow.  Decrease in size and strength of your urine stream.  Dribbling after urination.  Pain on urination (more common with infection).  Inability to pass urine. This needs immediate treatment.  The development of a urinary tract infection. How is this diagnosed? These tests will help your health care provider understand your problem:  A thorough history and physical examination.  A urination history, with the number of times you urinate, the amounts of urine, the strength of the urine stream, and the feeling of emptiness or fullness after urinating.  A postvoid bladder scan that measures any amount of urine that may remain in your bladder after you finish urinating.  Digital rectal exam. In a rectal exam, your health care provider checks your prostate by putting a gloved, lubricated finger into your rectum to feel the back of your prostate gland. This exam detects the size of your gland and abnormal lumps or growths.  Exam of your urine (urinalysis).  Prostate specific antigen (PSA) screening. This is a blood test used to screen for prostate  cancer.  Rectal ultrasonography. This test uses sound waves to electronically produce a picture of your prostate gland. How is this treated? Once symptoms begin, your health care provider will monitor your condition. Of the men with this condition, one third will have symptoms that stabilize, one third will have symptoms that improve, and one third will have symptoms that progress in the first year. Mild symptoms may not need treatment. Simple observation and yearly exams may be all that is required. Medicines and surgery are options for more severe problems. Your health care provider can help you make an informed decision for what is best. Two classes of medicines are available for relief of prostate symptoms:  Medicines that shrink the prostate. This helps relieve symptoms. These  medicines take time to work, and it may be months before any improvement is seen.  Uncommon side effects include problems with sexual function.  Medicines to relax the muscle of the prostate. This also relieves the obstruction by reducing any compression on the urethra.This group of medicines work much faster than those that reduce the size of the prostate gland. Usually, one can experience improvement in days to weeks..  Side effects can include dizziness, fatigue, lightheadedness, and retrograde ejaculation (diminished volume of ejaculate). Several types of surgical treatments are available for relief of prostate symptoms:  Transurethral resection of the prostate (TURP)-In this treatment, an instrument is inserted through opening at the tip of the penis. It is used to cut away pieces of the inner core of the prostate. The pieces are removed through the same opening of the penis. This removes the obstruction and helps get rid of the symptoms.  Transurethral incision (TUIP)-In this procedure, small cuts are made in the prostate. This lessens the prostates pressure on the urethra.  Transurethral microwave thermotherapy (TUMT)-This procedure uses microwaves to create heat. The heat destroys and removes a small amount of prostate tissue.  Transurethral needle ablation (TUNA)-This is a procedure that uses radio frequencies to do the same as TUMT.  Interstitial laser coagulation (ILC)-This is a procedure that uses a laser to do the same as TUMT and TUNA.  Transurethral electrovaporization (TUVP)-This is a procedure that uses electrodes to do the same as the procedures listed above. Contact a health care provider if:  You develop a fever.  There is unexplained back pain.  Symptoms are not helped by medicines prescribed.  You develop side effects from the medicine you are taking.  Your urine becomes very dark or has a bad smell.  Your lower abdomen becomes distended and you have difficulty  passing your urine. Get help right away if:  You are suddenly unable to urinate. This is an emergency. You should be seen immediately.  There are large amounts of blood or clots in the urine.  Your urinary problems become unmanageable.  You develop lightheadedness, severe dizziness, or you feel faint.  You develop moderate to severe low back or flank pain.  You develop chills or fever. This information is not intended to replace advice given to you by your health care provider. Make sure you discuss any questions you have with your health care provider. Document Released: 02/19/2005 Document Revised: 08/03/2015 Document Reviewed: 09/04/2012 Elsevier Interactive Patient Education  2017 [employer]  Generalized Anxiety Disorder Generalized anxiety disorder (GAD) is a mental disorder. It interferes with life functions, including relationships, work, and school. GAD is different from normal anxiety, which everyone experiences at some point in their lives in response to specific life events and  activities. Normal anxiety actually helps [place] prepare for and get through these life events and activities. Normal anxiety goes away after the event or activity is over.  GAD causes anxiety that is not necessarily related to specific events or activities. It also causes excess anxiety in proportion to specific events or activities. The anxiety associated with GAD is also difficult to control. GAD can vary from mild to severe. People with severe GAD can have intense waves of anxiety with physical symptoms (panic attacks).  SYMPTOMS The anxiety and worry associated with GAD are difficult to control. This anxiety and worry are related to many life events and activities and also occur more days than not for 6 months or longer. People with GAD also have three or more of the following symptoms (one or more in children):  Restlessness.   Fatigue.  Difficulty concentrating.   Irritability.  Muscle  tension.  Difficulty sleeping or unsatisfying sleep. DIAGNOSIS GAD is diagnosed through an assessment by your health care provider. Your health care provider will ask you questions aboutyour mood,physical symptoms, and events in your life. Your health care provider may ask you about your medical history and use of alcohol or drugs, including prescription medicines. Your health care provider may also do a physical exam and blood tests. Certain medical conditions and the use of certain substances can cause symptoms similar to those associated with GAD. Your health care provider may refer you to a mental health specialist for further evaluation. TREATMENT The following therapies are usually used to treat GAD:   Medication. Antidepressant medication usually is prescribed for long-term daily control. Antianxiety medicines may be added in severe cases, especially when panic attacks occur.   Talk therapy (psychotherapy). Certain types of talk therapy can be helpful in treating GAD by providing support, education, and guidance. A form of talk therapy called cognitive behavioral therapy can teach you healthy ways to think about and react to daily life events and activities.  Stress managementtechniques. These include yoga, meditation, and exercise and can be very helpful when they are practiced regularly. A mental health specialist can help determine which treatment is best for you. Some people see improvement with one therapy. However, other people require a combination of therapies. This information is not intended to replace advice given to you by your health care provider. Make sure you discuss any questions you have with your health care provider. Document Released: 06/16/2012 Document Revised: 03/12/2014 Document Reviewed: 06/16/2012 Elsevier Interactive Patient Education  2017 [employer] Rx sent to pharmacy, take as directed.   Please see Urologist for increased night-time  urination. Please return in 4 weeks for re-evaluation of anxiety and medication effectiveness

## 2016-03-26 DIAGNOSIS — M25551 Pain in right hip: Secondary | ICD-10-CM | POA: Diagnosis not present

## 2016-03-26 DIAGNOSIS — R531 Weakness: Secondary | ICD-10-CM | POA: Diagnosis not present

## 2016-03-27 DIAGNOSIS — S72101D Unspecified trochanteric fracture of right femur, subsequent encounter for closed fracture with routine healing: Secondary | ICD-10-CM | POA: Diagnosis not present

## 2016-03-30 ENCOUNTER — Encounter (HOSPITAL_COMMUNITY): Payer: Self-pay | Admitting: *Deleted

## 2016-03-30 ENCOUNTER — Inpatient Hospital Stay (HOSPITAL_COMMUNITY): Payer: Medicare Other

## 2016-03-30 ENCOUNTER — Emergency Department (HOSPITAL_COMMUNITY): Payer: Medicare Other

## 2016-03-30 ENCOUNTER — Inpatient Hospital Stay (HOSPITAL_COMMUNITY)
Admission: EM | Admit: 2016-03-30 | Discharge: 2016-04-04 | DRG: 061 | Disposition: A | Discharge status: Rehabilitation Facility | Payer: Medicare Other | Attending: Neurology | Admitting: Neurology

## 2016-03-30 DIAGNOSIS — G4700 Insomnia, unspecified: Secondary | ICD-10-CM | POA: Diagnosis present

## 2016-03-30 DIAGNOSIS — I951 Orthostatic hypotension: Secondary | ICD-10-CM

## 2016-03-30 DIAGNOSIS — R29898 Other symptoms and signs involving the musculoskeletal system: Secondary | ICD-10-CM | POA: Diagnosis not present

## 2016-03-30 DIAGNOSIS — I959 Hypotension, unspecified: Secondary | ICD-10-CM | POA: Diagnosis present

## 2016-03-30 DIAGNOSIS — I2510 Atherosclerotic heart disease of native coronary artery without angina pectoris: Secondary | ICD-10-CM | POA: Diagnosis present

## 2016-03-30 DIAGNOSIS — Z955 Presence of coronary angioplasty implant and graft: Secondary | ICD-10-CM | POA: Diagnosis not present

## 2016-03-30 DIAGNOSIS — N400 Enlarged prostate without lower urinary tract symptoms: Secondary | ICD-10-CM | POA: Diagnosis present

## 2016-03-30 DIAGNOSIS — T45615A Adverse effect of thrombolytic drugs, initial encounter: Secondary | ICD-10-CM | POA: Diagnosis present

## 2016-03-30 DIAGNOSIS — I69398 Other sequelae of cerebral infarction: Secondary | ICD-10-CM | POA: Diagnosis not present

## 2016-03-30 DIAGNOSIS — Z7982 Long term (current) use of aspirin: Secondary | ICD-10-CM | POA: Diagnosis not present

## 2016-03-30 DIAGNOSIS — Z87891 Personal history of nicotine dependence: Secondary | ICD-10-CM | POA: Diagnosis not present

## 2016-03-30 DIAGNOSIS — E1151 Type 2 diabetes mellitus with diabetic peripheral angiopathy without gangrene: Secondary | ICD-10-CM | POA: Diagnosis present

## 2016-03-30 DIAGNOSIS — Z96641 Presence of right artificial hip joint: Secondary | ICD-10-CM | POA: Diagnosis not present

## 2016-03-30 DIAGNOSIS — R4189 Other symptoms and signs involving cognitive functions and awareness: Secondary | ICD-10-CM | POA: Diagnosis present

## 2016-03-30 DIAGNOSIS — I63233 Cerebral infarction due to unspecified occlusion or stenosis of bilateral carotid arteries: Secondary | ICD-10-CM | POA: Diagnosis not present

## 2016-03-30 DIAGNOSIS — Z85828 Personal history of other malignant neoplasm of skin: Secondary | ICD-10-CM

## 2016-03-30 DIAGNOSIS — R400 Somnolence: Secondary | ICD-10-CM | POA: Diagnosis present

## 2016-03-30 DIAGNOSIS — N179 Acute kidney failure, unspecified: Secondary | ICD-10-CM | POA: Diagnosis present

## 2016-03-30 DIAGNOSIS — E1159 Type 2 diabetes mellitus with other circulatory complications: Secondary | ICD-10-CM | POA: Diagnosis not present

## 2016-03-30 DIAGNOSIS — K219 Gastro-esophageal reflux disease without esophagitis: Secondary | ICD-10-CM | POA: Diagnosis present

## 2016-03-30 DIAGNOSIS — E1165 Type 2 diabetes mellitus with hyperglycemia: Secondary | ICD-10-CM | POA: Diagnosis present

## 2016-03-30 DIAGNOSIS — N390 Urinary tract infection, site not specified: Secondary | ICD-10-CM | POA: Diagnosis present

## 2016-03-30 DIAGNOSIS — R0989 Other specified symptoms and signs involving the circulatory and respiratory systems: Secondary | ICD-10-CM | POA: Diagnosis not present

## 2016-03-30 DIAGNOSIS — I619 Nontraumatic intracerebral hemorrhage, unspecified: Secondary | ICD-10-CM | POA: Diagnosis not present

## 2016-03-30 DIAGNOSIS — M1711 Unilateral primary osteoarthritis, right knee: Secondary | ICD-10-CM | POA: Diagnosis present

## 2016-03-30 DIAGNOSIS — B9689 Other specified bacterial agents as the cause of diseases classified elsewhere: Secondary | ICD-10-CM | POA: Diagnosis present

## 2016-03-30 DIAGNOSIS — F419 Anxiety disorder, unspecified: Secondary | ICD-10-CM | POA: Diagnosis present

## 2016-03-30 DIAGNOSIS — R58 Hemorrhage, not elsewhere classified: Secondary | ICD-10-CM | POA: Diagnosis not present

## 2016-03-30 DIAGNOSIS — Z8249 Family history of ischemic heart disease and other diseases of the circulatory system: Secondary | ICD-10-CM

## 2016-03-30 DIAGNOSIS — I10 Essential (primary) hypertension: Secondary | ICD-10-CM | POA: Diagnosis not present

## 2016-03-30 DIAGNOSIS — G8929 Other chronic pain: Secondary | ICD-10-CM | POA: Diagnosis present

## 2016-03-30 DIAGNOSIS — Z801 Family history of malignant neoplasm of trachea, bronchus and lung: Secondary | ICD-10-CM

## 2016-03-30 DIAGNOSIS — Z8781 Personal history of (healed) traumatic fracture: Secondary | ICD-10-CM | POA: Diagnosis not present

## 2016-03-30 DIAGNOSIS — I482 Chronic atrial fibrillation: Secondary | ICD-10-CM | POA: Diagnosis not present

## 2016-03-30 DIAGNOSIS — D62 Acute posthemorrhagic anemia: Secondary | ICD-10-CM | POA: Diagnosis present

## 2016-03-30 DIAGNOSIS — I638 Other cerebral infarction: Secondary | ICD-10-CM | POA: Diagnosis not present

## 2016-03-30 DIAGNOSIS — I69320 Aphasia following cerebral infarction: Secondary | ICD-10-CM

## 2016-03-30 DIAGNOSIS — Z8673 Personal history of transient ischemic attack (TIA), and cerebral infarction without residual deficits: Secondary | ICD-10-CM | POA: Diagnosis not present

## 2016-03-30 DIAGNOSIS — M25551 Pain in right hip: Secondary | ICD-10-CM | POA: Diagnosis not present

## 2016-03-30 DIAGNOSIS — R7303 Prediabetes: Secondary | ICD-10-CM

## 2016-03-30 DIAGNOSIS — I480 Paroxysmal atrial fibrillation: Secondary | ICD-10-CM | POA: Diagnosis not present

## 2016-03-30 DIAGNOSIS — I69393 Ataxia following cerebral infarction: Secondary | ICD-10-CM | POA: Diagnosis not present

## 2016-03-30 DIAGNOSIS — Z7902 Long term (current) use of antithrombotics/antiplatelets: Secondary | ICD-10-CM | POA: Diagnosis not present

## 2016-03-30 DIAGNOSIS — I639 Cerebral infarction, unspecified: Secondary | ICD-10-CM | POA: Diagnosis not present

## 2016-03-30 DIAGNOSIS — I63412 Cerebral infarction due to embolism of left middle cerebral artery: Principal | ICD-10-CM | POA: Diagnosis present

## 2016-03-30 DIAGNOSIS — E785 Hyperlipidemia, unspecified: Secondary | ICD-10-CM | POA: Diagnosis not present

## 2016-03-30 DIAGNOSIS — H9190 Unspecified hearing loss, unspecified ear: Secondary | ICD-10-CM | POA: Diagnosis present

## 2016-03-30 DIAGNOSIS — D509 Iron deficiency anemia, unspecified: Secondary | ICD-10-CM | POA: Diagnosis present

## 2016-03-30 DIAGNOSIS — R29818 Other symptoms and signs involving the nervous system: Secondary | ICD-10-CM | POA: Diagnosis not present

## 2016-03-30 DIAGNOSIS — I6200 Nontraumatic subdural hemorrhage, unspecified: Secondary | ICD-10-CM | POA: Diagnosis present

## 2016-03-30 DIAGNOSIS — R2689 Other abnormalities of gait and mobility: Secondary | ICD-10-CM | POA: Diagnosis present

## 2016-03-30 DIAGNOSIS — E871 Hypo-osmolality and hyponatremia: Secondary | ICD-10-CM | POA: Diagnosis present

## 2016-03-30 DIAGNOSIS — G8191 Hemiplegia, unspecified affecting right dominant side: Secondary | ICD-10-CM | POA: Diagnosis not present

## 2016-03-30 DIAGNOSIS — R269 Unspecified abnormalities of gait and mobility: Secondary | ICD-10-CM | POA: Diagnosis not present

## 2016-03-30 DIAGNOSIS — M25561 Pain in right knee: Secondary | ICD-10-CM

## 2016-03-30 DIAGNOSIS — B952 Enterococcus as the cause of diseases classified elsewhere: Secondary | ICD-10-CM | POA: Diagnosis not present

## 2016-03-30 DIAGNOSIS — D508 Other iron deficiency anemias: Secondary | ICD-10-CM | POA: Diagnosis not present

## 2016-03-30 DIAGNOSIS — M25562 Pain in left knee: Secondary | ICD-10-CM | POA: Diagnosis not present

## 2016-03-30 DIAGNOSIS — D649 Anemia, unspecified: Secondary | ICD-10-CM | POA: Diagnosis not present

## 2016-03-30 DIAGNOSIS — R531 Weakness: Secondary | ICD-10-CM | POA: Diagnosis not present

## 2016-03-30 DIAGNOSIS — R4701 Aphasia: Secondary | ICD-10-CM | POA: Diagnosis not present

## 2016-03-30 HISTORY — DX: Cerebral infarction, unspecified: I63.9

## 2016-03-30 LAB — I-STAT CHEM 8, ED
BUN: 15 mg/dL (ref 6–20)
Calcium, Ion: 1.12 mmol/L — ABNORMAL LOW (ref 1.15–1.40)
Chloride: 99 mmol/L — ABNORMAL LOW (ref 101–111)
Creatinine, Ser: 1.1 mg/dL (ref 0.61–1.24)
Glucose, Bld: 96 mg/dL (ref 65–99)
HCT: 40 % (ref 39.0–52.0)
Hemoglobin: 13.6 g/dL (ref 13.0–17.0)
Potassium: 4 mmol/L (ref 3.5–5.1)
Sodium: 132 mmol/L — ABNORMAL LOW (ref 135–145)
TCO2: 20 mmol/L (ref 0–100)

## 2016-03-30 LAB — DIFFERENTIAL
Basophils Absolute: 0 10*3/uL (ref 0.0–0.1)
Basophils Relative: 0 %
Eosinophils Absolute: 0.2 10*3/uL (ref 0.0–0.7)
Eosinophils Relative: 2 %
Lymphocytes Relative: 22 %
Lymphs Abs: 2.4 10*3/uL (ref 0.7–4.0)
Monocytes Absolute: 0.9 10*3/uL (ref 0.1–1.0)
Monocytes Relative: 8 %
Neutro Abs: 7.2 10*3/uL (ref 1.7–7.7)
Neutrophils Relative %: 68 %

## 2016-03-30 LAB — CBC
HCT: 38.7 % — ABNORMAL LOW (ref 39.0–52.0)
Hemoglobin: 13.1 g/dL (ref 13.0–17.0)
MCH: 31 pg (ref 26.0–34.0)
MCHC: 33.9 g/dL (ref 30.0–36.0)
MCV: 91.5 fL (ref 78.0–100.0)
Platelets: 392 10*3/uL (ref 150–400)
RBC: 4.23 MIL/uL (ref 4.22–5.81)
RDW: 14.9 % (ref 11.5–15.5)
WBC: 10.7 10*3/uL — ABNORMAL HIGH (ref 4.0–10.5)

## 2016-03-30 LAB — COMPREHENSIVE METABOLIC PANEL
ALT: 20 U/L (ref 17–63)
AST: 47 U/L — ABNORMAL HIGH (ref 15–41)
Albumin: 3.5 g/dL (ref 3.5–5.0)
Alkaline Phosphatase: 87 U/L (ref 38–126)
Anion gap: 13 (ref 5–15)
BUN: 14 mg/dL (ref 6–20)
CO2: 19 mmol/L — ABNORMAL LOW (ref 22–32)
Calcium: 9.9 mg/dL (ref 8.9–10.3)
Chloride: 100 mmol/L — ABNORMAL LOW (ref 101–111)
Creatinine, Ser: 1.13 mg/dL (ref 0.61–1.24)
GFR calc Af Amer: >60 mL/min (ref >60)
GFR calc non Af Amer: 57 mL/min — ABNORMAL LOW (ref >60)
Glucose, Bld: 97 mg/dL (ref 65–99)
Potassium: 4 mmol/L (ref 3.5–5.1)
Sodium: 132 mmol/L — ABNORMAL LOW (ref 135–145)
Total Bilirubin: 0.9 mg/dL (ref 0.3–1.2)
Total Protein: 6.9 g/dL (ref 6.5–8.1)

## 2016-03-30 LAB — MRSA PCR SCREENING: MRSA by PCR: NEGATIVE

## 2016-03-30 LAB — PROTIME-INR
INR: 1.09
Prothrombin Time: 14.1 seconds (ref 11.4–15.2)

## 2016-03-30 LAB — I-STAT TROPONIN, ED: Troponin i, poc: 0 ng/mL (ref 0.00–0.08)

## 2016-03-30 LAB — APTT: aPTT: 32 seconds (ref 24–36)

## 2016-03-30 MED ORDER — SODIUM CHLORIDE 0.9 % IV SOLN
INTRAVENOUS | Status: DC
  Administered 2016-03-30: 17:00:00 via INTRAVENOUS
  Administered 2016-04-01: 1000 mL via INTRAVENOUS

## 2016-03-30 MED ORDER — ALTEPLASE (STROKE) FULL DOSE INFUSION
0.9000 mg/kg | Freq: Once | INTRAVENOUS | Status: AC
  Administered 2016-03-30: 69 mg via INTRAVENOUS

## 2016-03-30 MED ORDER — SENNOSIDES-DOCUSATE SODIUM 8.6-50 MG PO TABS
1.0000 | ORAL_TABLET | Freq: Every evening | ORAL | Status: DC | PRN
  Administered 2016-04-01: 1 via ORAL
  Filled 2016-03-30: qty 1

## 2016-03-30 MED ORDER — HYDRALAZINE HCL 20 MG/ML IJ SOLN: 10.0000 mg | INTRAMUSCULAR | Status: DC | PRN

## 2016-03-30 MED ORDER — CITALOPRAM HYDROBROMIDE 10 MG PO TABS
10.0000 mg | ORAL_TABLET | Freq: Every day | ORAL | Status: DC
  Administered 2016-03-31 – 2016-04-04 (×5): 10 mg via ORAL
  Filled 2016-03-30 (×5): qty 1

## 2016-03-30 MED ORDER — ACETAMINOPHEN 650 MG RE SUPP: 650.0000 mg | RECTAL | Status: DC | PRN

## 2016-03-30 MED ORDER — ACETAMINOPHEN 160 MG/5ML PO SOLN: 650.0000 mg | ORAL | Status: DC | PRN

## 2016-03-30 MED ORDER — LABETALOL HCL 5 MG/ML IV SOLN
20.0000 mg | Freq: Once | INTRAVENOUS | Status: AC
  Administered 2016-03-30: 10 mg via INTRAVENOUS

## 2016-03-30 MED ORDER — STROKE: EARLY STAGES OF RECOVERY BOOK
Freq: Once | Status: DC
  Filled 2016-03-30: qty 1

## 2016-03-30 MED ORDER — ACETAMINOPHEN 325 MG RE SUPP: 325.0000 mg | Freq: Four times a day (QID) | RECTAL | Status: DC | PRN

## 2016-03-30 MED ORDER — ACETAMINOPHEN 325 MG PO TABS
650.0000 mg | ORAL_TABLET | ORAL | Status: DC | PRN
  Administered 2016-03-30: 650 mg via ORAL

## 2016-03-30 MED ORDER — NICARDIPINE HCL IN NACL 20-0.86 MG/200ML-% IV SOLN
0.0000 mg/h | INTRAVENOUS | Status: DC
  Administered 2016-03-30: 5 mg/h via INTRAVENOUS

## 2016-03-30 MED ORDER — ATORVASTATIN CALCIUM 80 MG PO TABS
80.0000 mg | ORAL_TABLET | Freq: Every day | ORAL | Status: DC
  Administered 2016-03-30 – 2016-03-31 (×2): 80 mg via ORAL
  Filled 2016-03-30 (×2): qty 1

## 2016-03-30 MED ORDER — MIRABEGRON ER 25 MG PO TB24
50.0000 mg | ORAL_TABLET | Freq: Every day | ORAL | Status: DC
  Administered 2016-03-31 – 2016-04-04 (×5): 50 mg via ORAL
  Filled 2016-03-30 (×2): qty 2
  Filled 2016-03-30 (×3): qty 1
  Filled 2016-03-30: qty 2

## 2016-03-30 MED ORDER — LABETALOL HCL 5 MG/ML IV SOLN
INTRAVENOUS | Status: AC
  Filled 2016-03-30: qty 4

## 2016-03-30 MED ORDER — CITALOPRAM HYDROBROMIDE 10 MG PO TABS: 10.0000 mg | ORAL_TABLET | Freq: Every day | ORAL | Status: DC

## 2016-03-30 MED ORDER — PANTOPRAZOLE SODIUM 40 MG IV SOLR
40.0000 mg | Freq: Every day | INTRAVENOUS | Status: DC
  Administered 2016-03-30 – 2016-03-31 (×2): 40 mg via INTRAVENOUS
  Filled 2016-03-30 (×2): qty 40

## 2016-03-30 MED ORDER — ACETAMINOPHEN 325 MG PO TABS
650.0000 mg | ORAL_TABLET | Freq: Four times a day (QID) | ORAL | Status: DC | PRN
  Administered 2016-04-02 – 2016-04-04 (×2): 650 mg via ORAL
  Filled 2016-03-30 (×3): qty 2

## 2016-03-30 MED ORDER — TAMSULOSIN HCL 0.4 MG PO CAPS
0.4000 mg | ORAL_CAPSULE | ORAL | Status: DC
  Administered 2016-03-31 – 2016-04-04 (×3): 0.4 mg via ORAL
  Filled 2016-03-30 (×3): qty 1

## 2016-03-30 NOTE — ED Provider Notes (Signed)
[facility] DEPT Provider Note   CSN: [id number] Arrival date & time: 03/30/16  1240     History   Chief Complaint Chief Complaint  Patient presents with  . Code Stroke    HPI Herbert Marquez is a 83 y.o. male.  21 with a chief complaint of sudden onset slurred speech and right-sided facial droop and right-sided weakness. Made a code stroke out in triage. Patient with some difficulty with word finding on my exam. Level 5 caveat aphasia.    The history is provided by the patient and the spouse.  Cerebrovascular Accident  This is a new problem. The current episode started less than 1 hour ago. The problem occurs constantly. The problem has not changed since onset.Pertinent negatives include no chest pain, no abdominal pain, no headaches and no shortness of breath. Nothing aggravates the symptoms. Nothing relieves the symptoms. He has tried nothing for the symptoms. The treatment provided no relief.    Past Medical History:  Diagnosis Date  . Arthritis    "pretty much all over"   . Basal cell carcinoma    "several burned off his body, face, head"  . BPH (benign prostatic hypertrophy)   . Coronary artery disease    a. s/p PCI of RCA in 2006  . CVA (cerebral infarction)    a. 06/2015: left thalamic and bilateral PCA  . GERD (gastroesophageal reflux disease)   . Hyperlipidemia   . Hypertension   . TIA (transient ischemic attack)    Approximately 6 weeks post-cardiac catheterization.   . Type II diabetes mellitus ([place])    "prediabetic; lost alot of weight; not diabetic now" (06/16/2015)    Patient Active Problem List   Diagnosis Date Noted  . Acute ischemic stroke ([place]) 03/30/2016  . Persistent atrial fibrillation ([place])   . Displaced intertrochanteric fracture of right femur, initial encounter for closed fracture ([place]) 11/26/2015  . Protein-calorie malnutrition, severe 11/26/2015  . Closed right hip fracture ([place]) 11/25/2015  . BPH (benign prostatic hyperplasia)  11/25/2015  . GERD (gastroesophageal reflux disease) 11/25/2015  . CKD (chronic kidney disease), stage II 11/25/2015  . Closed left hip fracture ([place]) 11/25/2015  . Citrobacter infection   . Controlled diabetes mellitus type 2 with complications ([place])   . Urinary frequency   . Sepsis ([place]) 11/05/2015  . Overactive bladder   . Sepsis, unspecified organism ([place]) 11/04/2015  . H/O: CVA (cerebrovascular accident) 11/04/2015  . Palpitations 09/18/2015  . Cerebrovascular accident (CVA) due to bilateral occlusion of posterior cerebral arteries ([place]) 09/18/2015  . DM type 2 with diabetic peripheral neuropathy ([place])   . Gait disturbance, post-stroke 06/23/2015  . 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 Alterations of sensations following CVA (cerebrovascular accident) 06/23/2015  . Thalamic infarction ([place]) 06/21/2015  . Sinus tachycardia 06/20/2015  . Benign essential HTN   . Type 2 diabetes mellitus with complication, without long-term current use of insulin ([place])   . CAD in native artery   . Dysphagia as late effect of cerebrovascular disease   . Leukocytosis   . Hypokalemia   . Hyponatremia   . AKI (acute kidney injury) ([place])   . PSVT (paroxysmal supraventricular tachycardia) ([place])   . HLD (hyperlipidemia)   . Syncope 06/16/2015  . UTI (lower urinary tract infection) 08/31/2011  . Fracture of femoral neck, left ([place]) 03/09/2011  . Obesity 03/09/2011  . COPD (chronic obstructive pulmonary disease) ([place]) 03/09/2011    Past Surgical History:  Procedure Laterality Date  . CARDIOVASCULAR STRESS  TEST  07/01/2007   EF 74%  . CATARACT EXTRACTION, BILATERAL    . CORONARY ANGIOPLASTY WITH STENT PLACEMENT  10/2004   stenting x 2 to RCA  . FEMUR IM NAIL Right 11/26/2015   Procedure: INTRAMEDULLARY RIGHT (IM) NAIL FEMORAL;  Surgeon: Rod Can, MD;  Location: [facility] ORS;  Service: Orthopedics;  Laterality: Right;  . HERNIA REPAIR    . HIP ARTHROPLASTY  03/09/2011   Procedure: ARTHROPLASTY  BIPOLAR HIP;  Surgeon: Mauri Pole;  Location: [facility] ORS;  Service: Orthopedics;  Laterality: Left;  . LAPAROSCOPIC INCISIONAL / UMBILICAL / VENTRAL HERNIA REPAIR     "below his naval"       Home Medications    Prior to Admission medications   Medication Sig Start Date End Date Taking? Authorizing Provider  acetaminophen (TYLENOL) 500 MG tablet Take 1,000 mg by mouth every 6 (six) hours as needed for mild pain.   Yes Historical Provider, MD  aspirin EC 81 MG tablet Take 81 mg by mouth daily.   Yes Historical Provider, MD  atorvastatin (LIPITOR) 80 MG tablet Take 1 tablet (80 mg total) by mouth daily. 07/08/15  Yes Daniel J Angiulli, PA-C  clopidogrel (PLAVIX) 75 MG tablet Take 1 tablet by mouth daily. 02/23/16  Yes Historical Provider, MD  metoprolol tartrate (LOPRESSOR) 25 MG tablet Take 1 tablet (25 mg total) by mouth 2 (two) times daily. Patient taking differently: Take 12.5 mg by mouth 2 (two) times daily.  11/30/15  Yes Robbie Lis, MD  Multiple Vitamin (MULTIVITAMIN) tablet Take 1 tablet by mouth daily.    Yes Historical Provider, MD  MYRBETRIQ 50 MG TB24 tablet Take 1 tablet by mouth daily. 02/23/16  Yes Historical Provider, MD  nitroGLYCERIN (NITROSTAT) 0.4 MG SL tablet Place 0.4 mg under the tongue every 5 (five) minutes as needed for chest pain.   Yes Historical Provider, MD  tamsulosin (FLOMAX) 0.4 MG CAPS capsule Take 0.4 mg by mouth every other day.    Yes Historical Provider, MD  citalopram (CELEXA) 10 MG tablet Take 1 tablet (10 mg total) by mouth daily. 03/20/16   Odella Aquas, NP    Family History Family History  Problem Relation Age of Onset  . Hypertension Mother   . Lung cancer Father   . Lung cancer Brother     Social History Social History  Substance Use Topics  . Smoking status: Former Smoker    Quit date: 03/06/1971  . Smokeless tobacco: Never Used  . Alcohol use 4.8 oz/week    1 Cans of beer, 7 Glasses of wine per week     Comment: 1/2 BEER AND 1 WINE      Allergies   Patient has no known allergies.   Review of Systems Review of Systems  Constitutional: Negative for chills and fever.  HENT: Negative for congestion and facial swelling.   Eyes: Negative for discharge and visual disturbance.  Respiratory: Negative for shortness of breath.   Cardiovascular: Negative for chest pain and palpitations.  Gastrointestinal: Negative for abdominal pain, diarrhea and vomiting.  Musculoskeletal: Negative for arthralgias and myalgias.  Skin: Negative for color change and rash.  Neurological: Positive for facial asymmetry and speech difficulty. Negative for tremors, syncope and headaches.  Psychiatric/Behavioral: Negative for confusion and dysphoric mood.     Physical Exam Updated Vital Signs BP 150/87   Pulse 69   Temp 98.1 F (36.7 C)   Resp 25   SpO2 100%   Physical Exam  Constitutional:  He is oriented to person, place, and time. He appears well-developed and well-nourished.  HENT:  Head: Normocephalic and atraumatic.  Eyes: EOM are normal. Pupils are equal, round, and reactive to light.  Neck: Normal range of motion. Neck supple. No JVD present.  Cardiovascular: Normal rate and regular rhythm.  Exam reveals no gallop and no friction rub.   No murmur heard. Pulmonary/Chest: No respiratory distress. He has no wheezes.  Abdominal: He exhibits no distension. There is no rebound and no guarding.  Musculoskeletal: Normal range of motion.  Neurological: He is alert and oriented to person, place, and time.  Slurred speech, R sided facial droop, mild R sided weakness, difficulty closing R hand  Skin: No rash noted. No pallor.  Psychiatric: He has a normal mood and affect. His behavior is normal.  Nursing note and vitals reviewed.    ED Treatments / Results  Labs (all labs ordered are listed, but only abnormal results are displayed) Labs Reviewed  CBC - Abnormal; Notable for the following:       Result Value   WBC 10.7 (*)     HCT 38.7 (*)    All other components within normal limits  COMPREHENSIVE METABOLIC PANEL - Abnormal; Notable for the following:    Sodium 132 (*)    Chloride 100 (*)    CO2 19 (*)    AST 47 (*)    GFR calc non Af Amer 57 (*)    All other components within normal limits  I-STAT CHEM 8, ED - Abnormal; Notable for the following:    Sodium 132 (*)    Chloride 99 (*)    Calcium, Ion 1.12 (*)    All other components within normal limits  PROTIME-INR  APTT  DIFFERENTIAL  I-STAT TROPOININ, ED  CBG MONITORING, ED    EKG  EKG Interpretation None       Radiology Ct Head Code Stroke W/o Cm  Result Date: 03/30/2016 CLINICAL DATA:  Code stroke. Slurred speech, confusion. Prior stroke with residual right-sided weakness. EXAM: CT HEAD WITHOUT CONTRAST TECHNIQUE: Contiguous axial images were obtained from the base of the skull through the vertex without intravenous contrast. COMPARISON:  11/25/2015 FINDINGS: Brain: There is no evidence of acute cortical infarct, intracranial hemorrhage, mass, midline shift, or extra-axial fluid collection. Cerebral atrophy is unchanged. Patchy cerebral white matter hypodensities are unchanged and compatible with moderate chronic small vessel ischemic disease. A chronic lacunar infarct is again noted in the lateral aspect of the left thalamus. Vascular: Calcified atherosclerosis at the skullbase. No hyperdense vessel. Skull: No fracture focal osseous lesion. Sinuses/Orbits: Visualized paranasal sinuses and mastoid air cells are clear. Prior bilateral cataract extraction. Other: None. ASPECTS [facility] Stroke Program Early CT Score) - Ganglionic level infarction (caudate, lentiform nuclei, internal capsule, insula, M1-M3 cortex): 7 - Supraganglionic infarction (M4-M6 cortex): 3 Total score (0-10 with 10 being normal): 10 IMPRESSION: 1. No evidence of acute intracranial abnormality. 2. ASPECTS is 10. 3. Moderate chronic small vessel ischemic disease and chronic left  thalamic infarct. Dr. Shon Hale paged at 1:10 p.m.  Results also sent via text page. Electronically Signed   By: Logan Bores M.D.   On: 03/30/2016 13:19    Procedures Procedures (including critical care time)  Medications Ordered in ED Medications  alteplase (ACTIVASE) 1 mg/mL infusion 69 mg (not administered)   stroke: mapping our early stages of recovery book (not administered)  0.9 %  sodium chloride infusion (not administered)  acetaminophen (TYLENOL) tablet 650 mg (not administered)  Or  acetaminophen (TYLENOL) solution 650 mg (not administered)    Or  acetaminophen (TYLENOL) suppository 650 mg (not administered)  senna-docusate (Senokot-S) tablet 1 tablet (not administered)  pantoprazole (PROTONIX) injection 40 mg (not administered)  labetalol (NORMODYNE,TRANDATE) injection 20 mg (not administered)    And  nicardipine (CARDENE) 20mg  in 0.86% saline 231ml IV infusion (0.1 mg/ml) (not administered)  tamsulosin (FLOMAX) capsule 0.4 mg (not administered)  atorvastatin (LIPITOR) tablet 80 mg (not administered)  acetaminophen (TYLENOL) suppository 325 mg (not administered)  acetaminophen (TYLENOL) tablet 650 mg (not administered)     Initial Impression / Assessment and Plan / ED Course  I have reviewed the triage vital signs and the nursing notes.  Pertinent labs & imaging results that were available during my care of the patient were reviewed by me and considered in my medical decision making (see chart for details).     83 yo M with a cc of concern for stroke.  Code stroke in triage, airway cleared by me.  Given TPA by neuro, admit to the ICU.   CRITICAL CARE Performed by: Cecilio Asper   Total critical care time: 35 minutes  Critical care time was exclusive of separately billable procedures and treating other patients.  Critical care was necessary to treat or prevent imminent or life-threatening deterioration.  Critical care was time spent personally by me on  the following activities: development of treatment plan with patient and/or surrogate as well as nursing, discussions with consultants, evaluation of patient's response to treatment, examination of patient, obtaining history from patient or surrogate, ordering and performing treatments and interventions, ordering and review of laboratory studies, ordering and review of radiographic studies, pulse oximetry and re-evaluation of patient's condition.   The patients results and plan were reviewed and discussed.   Any x-rays performed were independently reviewed by myself.   Differential diagnosis were considered with the presenting HPI.  Medications  alteplase (ACTIVASE) 1 mg/mL infusion 69 mg (not administered)   stroke: mapping our early stages of recovery book (not administered)  0.9 %  sodium chloride infusion (not administered)  acetaminophen (TYLENOL) tablet 650 mg (not administered)    Or  acetaminophen (TYLENOL) solution 650 mg (not administered)    Or  acetaminophen (TYLENOL) suppository 650 mg (not administered)  senna-docusate (Senokot-S) tablet 1 tablet (not administered)  pantoprazole (PROTONIX) injection 40 mg (not administered)  labetalol (NORMODYNE,TRANDATE) injection 20 mg (not administered)    And  nicardipine (CARDENE) 20mg  in 0.86% saline 278ml IV infusion (0.1 mg/ml) (not administered)  tamsulosin (FLOMAX) capsule 0.4 mg (not administered)  atorvastatin (LIPITOR) tablet 80 mg (not administered)  acetaminophen (TYLENOL) suppository 325 mg (not administered)  acetaminophen (TYLENOL) tablet 650 mg (not administered)  labetalol (NORMODYNE,TRANDATE) 5 MG/ML injection (not administered)    Vitals:   03/30/16 1306 03/30/16 1313 03/30/16 1319  BP: 170/79 156/95 150/87  Pulse: 64 66 69  Resp: 18 21 25   Temp:   98.1 F (36.7 C)  SpO2: 100% 100% 100%    Final diagnoses:  Acute ischemic stroke ([place])  Acute ischemic stroke [facility])    Admission/ observation were discussed  with the admitting physician, patient and/or family and they are comfortable with the plan.      Final Clinical Impressions(s) / ED Diagnoses   Final diagnoses:  Acute ischemic stroke ([place])  Acute ischemic stroke [facility])    New Prescriptions New Prescriptions   No medications on file     Deno Etienne, DO 03/30/16 1339

## 2016-03-30 NOTE — Care Management Note (Signed)
Case Management Note  Patient Details  Name: Herbert Marquez MRN: [id number] Date of Birth: [birth year]  Subjective/Objective:   Pt admitted on 03/30/16 s/p acute ischemic stroke s/p TPA.  PTA, pt resides at home with spouse.                   Action/Plan: Will follow for discharge planning as pt progresses.  PT/OT evaluations pending medical stability.    Expected Discharge Date:                  Expected Discharge Plan:  [place]  In-House Referral:     Discharge planning Services  CM Consult  Post Acute Care Choice:    Choice offered to:     DME Arranged:    DME Agency:     HH Arranged:    [place] Agency:     Status of Service:  In process, will continue to follow  If discussed at Long Length of Stay Meetings, dates discussed:    Additional Comments:  Reinaldo Raddle, RN, BSN  Trauma/Neuro ICU Case Manager [phone]

## 2016-03-30 NOTE — ED Triage Notes (Signed)
Pt in from home with c/o stroke-like symptoms. Family states pt became tired and confused at 1215. Hx of CVA in Apr 17' with residual R side weakness. Pt takes Plavix

## 2016-03-30 NOTE — Progress Notes (Signed)
Pt's watch and ring were given to his wife, Lovey Newcomer.  He has [facility] hearing aides in place.

## 2016-03-30 NOTE — ED Triage Notes (Addendum)
Pt in per wife 20 mins pta pt started to have slurred speech and "is not able to get his words out." hx of CVA, Code Stroke called at nurse first

## 2016-03-30 NOTE — H&P (Signed)
Neurology Consult Note    Reason for admission: Acute ischemic stroke s/p tPA  CC: Patient is aphasic and unable to provide  HPI: This is an 68-yo RH man who presents to the ED for the evaluation of speech changes. History is obtained from the patient's wife and daughter who are present at the bedside. The patient has expressive aphasia and is unable to provide any information.   His wife reports that he went to his rehab appointment this morning and was in his usual state of health. They were on their way home and stopped at the grocery store. He stayed in the car while she went inside. When she returned, she says that he was unable to figure out how to unlock the car door. When she got in the car she noted that his language was not making sense. She brought him to the ED where CODE STROKE was activated. I met the patient in the ED. He was taken for an emergent CT of the head which did not reveal any acute abnormality. He was noted to have expressive aphasia with mild dysarthria. NIHSS was 6 but 2 points were for inability to lift his R leg off the bed because of chronic weakness following R THA in September 2017.   After discussion with the patient's wife and daughter, the decision was made to proceed with tPA. He was given a bolus of 7 mg followed by an infusion of 62 mg. The bolus was initially started at 1321. However, due to air microbubbles in the line the tPA would not infuse. After exhaustive attempts to eliminate the bubbles, a new bottle of tPA was mixed by pharmacy. In the meantime, the patient developed severe HTN with SBP 260s, DBP 200s. He was given labetalol 10 mg IV and nicardipine drip was started with excellent response. BP initially dropped to 118/81 with no change in his neurologic status. His nicardipine was stopped and BP has remained SBP 140s, DBP 80s. His tPA was then infused with bolus given at 1357 and infusion started at 1358. His neurologic exam has remained stable.   Last  known well: 1221 NHISS score: 6 tPA given?: Yes   PMH:  Past Medical History:  Diagnosis Date  . Arthritis    "pretty much all over"   . Basal cell carcinoma    "several burned off his body, face, head"  . BPH (benign prostatic hypertrophy)   . Coronary artery disease    a. s/p PCI of RCA in 2006  . CVA (cerebral infarction)    a. 06/2015: left thalamic and bilateral PCA  . GERD (gastroesophageal reflux disease)   . Hyperlipidemia   . Hypertension   . TIA (transient ischemic attack)    Approximately 6 weeks post-cardiac catheterization.   . Type II diabetes mellitus ([place])    "prediabetic; lost alot of weight; not diabetic now" (06/16/2015)    PSH:  Past Surgical History:  Procedure Laterality Date  . CARDIOVASCULAR STRESS TEST  07/01/2007   EF 74%  . CATARACT EXTRACTION, BILATERAL    . CORONARY ANGIOPLASTY WITH STENT PLACEMENT  10/2004   stenting x 2 to RCA  . FEMUR IM NAIL Right 11/26/2015   Procedure: INTRAMEDULLARY RIGHT (IM) NAIL FEMORAL;  Surgeon: Rod Can, MD;  Location: [facility];  Service: Orthopedics;  Laterality: Right;  . HERNIA REPAIR    . HIP ARTHROPLASTY  03/09/2011   Procedure: ARTHROPLASTY BIPOLAR HIP;  Surgeon: Mauri Pole;  Location: [facility];  Service: Orthopedics;  Laterality: Left;  . LAPAROSCOPIC INCISIONAL / UMBILICAL / VENTRAL HERNIA REPAIR     "below his naval"    Family history: Family History  Problem Relation Age of Onset  . Hypertension Mother   . Lung cancer Father   . Lung cancer Brother     Social history:  Social History   Social History  . Marital status: Married    Spouse name: N/A  . Number of children: N/A  . Years of education: N/A   Occupational History  . Retired Other   Social History Main Topics  . Smoking status: Former Smoker    Quit date: 03/06/1971  . Smokeless tobacco: Never Used  . Alcohol use 4.8 oz/week    1 Cans of beer, 7 Glasses of wine per week     Comment: 1/2 BEER AND 1 WINE  . Drug use: No  .  Sexual activity: No   Other Topics Concern  . Not on file   Social History Narrative   Lives in [place], [place] with wife. Has 3 children.     Current outpatient meds: Current Meds  Medication Sig  . acetaminophen (TYLENOL) 500 MG tablet Take 1,000 mg by mouth every 6 (six) hours as needed for mild pain.  [name] aspirin EC 81 MG tablet Take 81 mg by mouth daily.  [name] atorvastatin (LIPITOR) 80 MG tablet Take 1 tablet (80 mg total) by mouth daily.  . clopidogrel (PLAVIX) 75 MG tablet Take 1 tablet by mouth daily.  . metoprolol tartrate (LOPRESSOR) 25 MG tablet Take 1 tablet (25 mg total) by mouth 2 (two) times daily. (Patient taking differently: Take 12.5 mg by mouth 2 (two) times daily. )  . Multiple Vitamin (MULTIVITAMIN) tablet Take 1 tablet by mouth daily.   [name] MYRBETRIQ 50 MG TB24 tablet Take 1 tablet by mouth daily.  . nitroGLYCERIN (NITROSTAT) 0.4 MG SL tablet Place 0.4 mg under the tongue every 5 (five) minutes as needed for chest pain.  . tamsulosin (FLOMAX) 0.4 MG CAPS capsule Take 0.4 mg by mouth every other day.     Current inpatient meds:  Current Facility-Administered Medications  Medication Dose Route Frequency Provider Last Rate Last Dose  .  stroke: mapping our early stages of recovery book   Does not apply Once Darrel Reach, MD      . 0.9 %  sodium chloride infusion   Intravenous Continuous Darrel Reach, MD      . acetaminophen (TYLENOL) tablet 650 mg  650 mg Oral Q4H PRN Darrel Reach, MD       Or  . acetaminophen (TYLENOL) solution 650 mg  650 mg Per Tube Q4H PRN Darrel Reach, MD       Or  . acetaminophen (TYLENOL) suppository 650 mg  650 mg Rectal Q4H PRN Darrel Reach, MD      . acetaminophen (TYLENOL) suppository 325 mg  325 mg Rectal Q6H PRN Darrel Reach, MD      . acetaminophen (TYLENOL) tablet 650 mg  650 mg Oral Q6H PRN Darrel Reach, MD      . alteplase (ACTIVASE) 1 mg/mL infusion 69 mg  0.9 mg/kg Intravenous Once  Darrel Reach, MD      . atorvastatin (LIPITOR) tablet 80 mg  80 mg Oral q1800 Darrel Reach, MD      . citalopram (CELEXA) tablet 10 mg  10 mg Oral Daily Darrel Reach, MD      . labetalol (  NORMODYNE,TRANDATE) injection 20 mg  20 mg Intravenous Once Darrel Reach, MD       And  . nicardipine (CARDENE) 24m in 0.86% saline 2091mIV infusion (0.1 mg/ml)  0-15 mg/hr Intravenous Continuous TiDarrel ReachMD      . pantoprazole (PROTONIX) injection 40 mg  40 mg Intravenous QHS TiDarrel ReachMD      . senna-docusate (Senokot-S) tablet 1 tablet  1 tablet Oral QHS PRN TiDarrel ReachMD      . tamsulosin (FLawrence Memorial Hospitalcapsule 0.4 mg  0.4 mg Oral QODAY TiDarrel ReachMD       Current Outpatient Prescriptions  Medication Sig Dispense Refill  . acetaminophen (TYLENOL) 500 MG tablet Take 1,000 mg by mouth every 6 (six) hours as needed for mild pain.    . Marland Kitchenspirin EC 81 MG tablet Take 81 mg by mouth daily.    . Marland Kitchentorvastatin (LIPITOR) 80 MG tablet Take 1 tablet (80 mg total) by mouth daily. 30 tablet 0  . clopidogrel (PLAVIX) 75 MG tablet Take 1 tablet by mouth daily.    . metoprolol tartrate (LOPRESSOR) 25 MG tablet Take 1 tablet (25 mg total) by mouth 2 (two) times daily. (Patient taking differently: Take 12.5 mg by mouth 2 (two) times daily. ) 60 tablet 0  . Multiple Vitamin (MULTIVITAMIN) tablet Take 1 tablet by mouth daily.     . Marland KitchenYRBETRIQ 50 MG TB24 tablet Take 1 tablet by mouth daily.    . nitroGLYCERIN (NITROSTAT) 0.4 MG SL tablet Place 0.4 mg under the tongue every 5 (five) minutes as needed for chest pain.    . tamsulosin (FLOMAX) 0.4 MG CAPS capsule Take 0.4 mg by mouth every other day.     . citalopram (CELEXA) 10 MG tablet Take 1 tablet (10 mg total) by mouth daily. 30 tablet 0    Allergies: No Known Allergies  ROS: As per HPI. A full 14-point review of systems could not be obtained due to the patient's aphasia.   PE:  BP 150/87   Pulse 69    Temp 98.1 F (36.7 C)   Resp 25   SpO2 100%   General: WDWN, no acute distress. AAO x4. Speech mildly dysarthric. He has a moderate expressive aphasia. Receptive language is relatively spared though he has difficulty with complex commands. He is able to mimic without difficulty. Affect is bright with congruent mood.  HEENT: Normocephalic. Neck supple without LAD. MMM, OP clear. Dentition good. Sclerae anicteric. No conjunctival injection. Hearing aids are in place. CV: Regular, no murmur. Carotid pulses full and symmetric, no bruits. Distal pulses 2+ and symmetric.  Lungs: CTAB on anterior exam..  Abdomen: Soft, non-distended, non-tender. Bowel sounds present x4.  Extremities: No cyanosis/clubbing. He has mild edema BLE, R>L. Neuro:  CN: Pupils are equal and round. They are symmetrically reactive from 3-->2 mm. He blinks to visual threat x4. EOMI without nystagmus. There is breakup of smooth pursuits in all directions. Facial sensation appears intact to light touch. corneals are intact and symmetric. Face is symmetric at rest with normal strength and mobility. Hearing is diminished but he does well with loud conversational voice as long as his hearing aids are in  place. Palate elevates symmetrically and uvula is midline. Voice is normal in tone, pitch and quality. Bilateral SCM and trapezii are 5/5. Tongue is midline with normal bulk and mobility.  Motor: Normal bulk, tone, and strength with he exception of some reduced ROM and giveway weakness in  the RLE. No tremor or other abnormal movements. No drift.  Sensation: Intact to light touch. No evidence of neglect or extinction. DTRs: 2+, symmetric. Toes downgoing bilaterally. No pathologic reflexes.  Coordination: Finger-to-nose and heel-to-shin are without dysmetria. Finger taps are normal in amplitude and speed, no decrement.    Labs:  Lab Results  Component Value Date   WBC 10.7 (H) 03/30/2016   HGB 13.6 03/30/2016   HCT 40.0 03/30/2016    PLT 392 03/30/2016   GLUCOSE 96 03/30/2016   CHOL 106 06/17/2015   TRIG 95 06/17/2015   HDL 34 (L) 06/17/2015   LDLCALC 53 06/17/2015   ALT 20 03/30/2016   AST 47 (H) 03/30/2016   NA 132 (L) 03/30/2016   K 4.0 03/30/2016   CL 99 (L) 03/30/2016   CREATININE 1.10 03/30/2016   BUN 15 03/30/2016   CO2 19 (L) 03/30/2016   TSH 1.789 03/09/2011   INR 1.09 03/30/2016   HGBA1C 5.6 06/17/2015    Imaging:  I have personally and independently reviewed the [facility] without contrast from today. This shows moderate diffuse generalized atrophy. There is moderate chronic small vessel ischemic disease in the bihemispheric white matter. An old lacunar infarct is noted in the L thalamus. There is a focal area of hypodensity in the L parieto-occipital region suggestive of remote infarct vs volume averaging. Atherosclerotic calcification is noted in the vertebral and carotid arteries bilaterally. No acute abnormality is noted.   Other diagnostic studies:  TTE 11/28/15: mild LVH, mild dilation R ventricle, EF 65-70%.  Assessment and Plan:  1. Acute Ischemic Stroke: This is an acute stroke involving the L MCA territory. Known risk factors for cerebrovascular disease in this patient include age, HTN, hyperlipidemia, CAD, h/o stroke, DM. Additional workup will be ordered to include MRI brain, carotid Dopplers, TTE, fasting lipids, and hemoglobin a1c. Further testing will be determined by results from these initial studies.   Admit to neuro ICU for close monitoring for first 24 hours post-tPA with frequent neuro checks per protocol  Avoid antiplatelet agents and anticoagulation for the first 24 hours post-tPA   Monitor blood pressure closely, keeping SBP<180 and DBP<105 with short-acting easily titratable antihypertensives (i.e. Labetalol PRN, nicardipine or cleviprex infusion)  Avoid invasive procedures or punctures at non-compressible sites for the first 24 hours post-tPA  NPO until swallow evaluation  passed  Initiate/continue statin for secondary stroke prevention  Will start aspirin and Plavix for secondary stroke prevention once 24 hours out from tPA  Avoid fever and hyperglycemia as these may result in extension of infarct and are associated with worse neurologic prognosis  MRI brain without contrast  MRA head without contrast  Carotid Dopplers  TTE  Check fasting lipids and hemoglobin a1c  PT/OT/SLP to evaluate and treat as needed   2. Global aphasia: This is acute, due to stroke. This is mainly expressive with receptive language relatively spared. However, he does have some difficulty with more complex commands. ST to evaluate and treat. NPO until cleared with swallow eval.   3. RLE weakness: This is chronic and unchanged, present since R THA September 2017. There is reduced ROM in the R hip, possible some mild weakness as well. PT to eval and treat as needed.   This was discussed at length with the patient's wife and three children at length. They are in agreement with the plan as noted. They were given the chance to ask any questions and these were addressed to their satisfaction.   This patient  is critically ill and at significant risk of neurological worsening, death and care requires constant monitoring of vital signs, hemodynamics,respiratory and cardiac monitoring, neurological assessment, discussion with family, other specialists and medical decision making of high complexity. A total of 120 minutes of critical care time was spent on this case.

## 2016-03-30 NOTE — Progress Notes (Signed)
eLink Physician-Brief Progress Note Patient Name: Herbert Marquez DOB: April 08, 1927 MRN: [id number]   Date of Service  03/30/2016  HPI/Events of Note  Aphasia , RLE weakness (old) Acute L MCA CVA BP high  eICU Interventions  Hydralazine rn cardene gtt     Intervention Category Evaluation Type: New Patient Evaluation  ALVA,RAKESH V. 03/30/2016, 3:49 PM

## 2016-03-31 ENCOUNTER — Inpatient Hospital Stay (HOSPITAL_COMMUNITY): Payer: Medicare Other

## 2016-03-31 DIAGNOSIS — E785 Hyperlipidemia, unspecified: Secondary | ICD-10-CM

## 2016-03-31 DIAGNOSIS — R4701 Aphasia: Secondary | ICD-10-CM

## 2016-03-31 DIAGNOSIS — Z8673 Personal history of transient ischemic attack (TIA), and cerebral infarction without residual deficits: Secondary | ICD-10-CM

## 2016-03-31 DIAGNOSIS — E1159 Type 2 diabetes mellitus with other circulatory complications: Secondary | ICD-10-CM

## 2016-03-31 LAB — GLUCOSE, CAPILLARY
Glucose-Capillary: 112 mg/dL — ABNORMAL HIGH (ref 65–99)
Glucose-Capillary: 105 mg/dL — ABNORMAL HIGH (ref 65–99)
Glucose-Capillary: 106 mg/dL — ABNORMAL HIGH (ref 65–99)

## 2016-03-31 LAB — LIPID PANEL
Cholesterol: 77 mg/dL (ref 0–200)
HDL: 32 mg/dL — ABNORMAL LOW (ref >40)
LDL Cholesterol: 38 mg/dL (ref 0–99)
Total CHOL/HDL Ratio: 2.4 RATIO
Triglycerides: 37 mg/dL (ref <150)
VLDL: 7 mg/dL (ref 0–40)

## 2016-03-31 MED ORDER — INSULIN ASPART 100 UNIT/ML ~~LOC~~ SOLN: 0.0000 [IU] | Freq: Three times a day (TID) | SUBCUTANEOUS | Status: DC

## 2016-03-31 MED ORDER — LORAZEPAM 1 MG PO TABS
1.0000 mg | ORAL_TABLET | Freq: Once | ORAL | Status: AC
  Administered 2016-03-31: 1 mg via ORAL
  Filled 2016-03-31: qty 1

## 2016-03-31 MED ORDER — IOPAMIDOL (ISOVUE-370) INJECTION 76%
INTRAVENOUS | Status: AC
  Filled 2016-03-31: qty 50

## 2016-03-31 NOTE — Progress Notes (Signed)
PT Cancellation Note  Patient Details Name: Herbert Marquez MRN: [id number] DOB: [birth year]   Cancelled Treatment:    Reason Eval/Treat Not Completed: Patient not medically ready (remains on bedrest)   Duncan Dull 03/31/2016, 8:09 AM Alben Deeds, PT DPT  [phone]

## 2016-03-31 NOTE — Evaluation (Signed)
Speech Language Pathology Evaluation Patient Details Name: Herbert Marquez MRN: [id number] DOB: 16-Dec-1927 Today's Date: 03/31/2016 Time: 0920-0950 SLP Time Calculation (min) (ACUTE ONLY): 30 min  Problem List:  Patient Active Problem List   Diagnosis Date Noted  . Acute ischemic stroke ([place]) 03/30/2016  . Persistent atrial fibrillation ([place])   . Displaced intertrochanteric fracture of right femur, initial encounter for closed fracture ([place]) 11/26/2015  . Protein-calorie malnutrition, severe 11/26/2015  . Closed right hip fracture ([place]) 11/25/2015  . BPH (benign prostatic hyperplasia) 11/25/2015  . GERD (gastroesophageal reflux disease) 11/25/2015  . CKD (chronic kidney disease), stage II 11/25/2015  . Closed left hip fracture ([place]) 11/25/2015  . Citrobacter infection   . Controlled diabetes mellitus type 2 with complications ([place])   . Urinary frequency   . Sepsis ([place]) 11/05/2015  . Overactive bladder   . Sepsis, unspecified organism ([place]) 11/04/2015  . H/O: CVA (cerebrovascular accident) 11/04/2015  . Palpitations 09/18/2015  . Cerebrovascular accident (CVA) due to bilateral occlusion of posterior cerebral arteries ([place]) 09/18/2015  . DM type 2 with diabetic peripheral neuropathy ([place])   . Gait disturbance, post-stroke 06/23/2015  . 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 Alterations of sensations following CVA (cerebrovascular accident) 06/23/2015  . Thalamic infarction ([place]) 06/21/2015  . Sinus tachycardia 06/20/2015  . Benign essential HTN   . Type 2 diabetes mellitus with complication, without long-term current use of insulin ([place])   . CAD in native artery   . Dysphagia as late effect of cerebrovascular disease   . Leukocytosis   . Hypokalemia   . Hyponatremia   . AKI (acute kidney injury) ([place])   . PSVT (paroxysmal supraventricular tachycardia) ([place])   . HLD (hyperlipidemia)   . Syncope 06/16/2015  . UTI (lower urinary tract infection) 08/31/2011  . Fracture of  femoral neck, left ([place]) 03/09/2011  . Obesity 03/09/2011  . COPD (chronic obstructive pulmonary disease) ([place]) 03/09/2011   Past Medical History:  Past Medical History:  Diagnosis Date  . Arthritis    "pretty much all over"   . Basal cell carcinoma    "several burned off his body, face, head"  . BPH (benign prostatic hypertrophy)   . Coronary artery disease    a. s/p PCI of RCA in 2006  . CVA (cerebral infarction)    a. 06/2015: left thalamic and bilateral PCA  . GERD (gastroesophageal reflux disease)   . Hyperlipidemia   . Hypertension   . TIA (transient ischemic attack)    Approximately 6 weeks post-cardiac catheterization.   . Type II diabetes mellitus ([place])    "prediabetic; lost alot of weight; not diabetic now" (06/16/2015)   Past Surgical History:  Past Surgical History:  Procedure Laterality Date  . CARDIOVASCULAR STRESS TEST  07/01/2007   EF 74%  . CATARACT EXTRACTION, BILATERAL    . CORONARY ANGIOPLASTY WITH STENT PLACEMENT  10/2004   stenting x 2 to RCA  . FEMUR IM NAIL Right 11/26/2015   Procedure: INTRAMEDULLARY RIGHT (IM) NAIL FEMORAL;  Surgeon: Rod Can, MD;  Location: [facility];  Service: Orthopedics;  Laterality: Right;  . HERNIA REPAIR    . HIP ARTHROPLASTY  03/09/2011   Procedure: ARTHROPLASTY BIPOLAR HIP;  Surgeon: Mauri Pole;  Location: [facility];  Service: Orthopedics;  Laterality: Left;  . LAPAROSCOPIC INCISIONAL / UMBILICAL / VENTRAL HERNIA REPAIR     "below his naval"   HPI:  49-yo RH man who presents to the ED for the evaluation of speech changes.  History is obtained from the patient's wife and daughter who are present at the bedside. 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 characterized by semantic/phonemic paraphasias, minimal groping, unaware of errors, but this could be affected by unaided [facility] (pt does have HA's, but not currently with him); slight right facial weakness noted at rest; pt able to answer simple 1-2 word responses inconsistently, but with minimal phonemic cueing and sentence completion, pt was able to produce simple phrases; repetition impaired at level of phrases without minimal verbal cueing; auditory comprehension appears WFL at simple 1-2 step directives and answering personal information questions; oriented to self only, but expressive aphasia affecting orientation.  Cognitive assessment limited d/t moderate expressive aphasia; passed NSSS; recommend CIR d/t previous level of functioning and family support; ST will f/u while in house for speech needs.    SLP Assessment  Patient needs continued Speech Language Pathology Services    Follow Up Recommendations  Inpatient Rehab    Frequency and Duration min 2x/week  1 week      SLP Evaluation Cognition  Overall Cognitive Status: Difficult to assess Arousal/Alertness: Awake/alert Orientation Level: Oriented to person;Other (comment) (may not be accurate d/t level of expressive aphasia) Memory: Impaired Memory Impairment: Retrieval deficit Behaviors: Perseveration       Comprehension  Auditory Comprehension Overall Auditory Comprehension: Appears within functional limits for tasks assessed Yes/No Questions: Within Functional Limits Commands: Within Functional Limits Conversation: Simple Interfering Components: Hearing EffectiveTechniques: Repetition;Slowed speech;Visual/Gestural cues Visual Recognition/Discrimination Discrimination:  (Able to read environmental signs, but no in depth assessment) Reading Comprehension Reading Status: Unable to assess (comment)    Expression Expression Primary Mode of Expression: Other (comment) (verbal, but needs  cues/gestures) Verbal Expression Overall Verbal Expression: Impaired Initiation: Impaired Level of Generative/Spontaneous Verbalization: Word;Phrase Repetition: Impaired Level of Impairment: Phrase level Naming: Impairment Responsive: 26-50% accurate Confrontation: Impaired Convergent: 25-49% accurate Divergent: 25-49% accurate Verbal Errors: Phonemic paraphasias;Perseveration;Other (comment) (awareness difficult to determine d/t HOH; no aids available) Pragmatics: No impairment Effective Techniques: Articulatory cues;Phonemic cues;Sentence completion Non-Verbal Means of Communication: Gestures;Other (comment) (paired with verbal responses) Written Expression Dominant Hand: Right Written Expression: Not tested   Oral / Motor  Oral Motor/Sensory Function Overall Oral Motor/Sensory Function: Mild impairment Facial ROM: Reduced right Facial Symmetry: Abnormal symmetry right;Other (Comment) (slight) Motor Speech Overall Motor Speech: Impaired Respiration: Within functional limits Phonation: Normal Resonance: Within functional limits Articulation: Within functional limitis Intelligibility: Intelligible Motor Planning: Impaired Level of Impairment: Phrase Motor Speech Errors: Unaware;Consistent Interfering Components: Hearing loss Effective Techniques: Slow rate;Pacing             Functional Assessment Tool Used: NOMS; clinical judgment Functional Limitations: Spoken language expressive Spoken Language Expression Current Status [id number]): At least 40 percent but less than 60 percent impaired, limited or restricted Spoken Language Expression Goal Status [phone]): At least 20 percent but less than 40 percent impaired, limited or restricted         Phelix Fudala,PAT, M.S., CCC-SLP 03/31/2016, 10:20 AM

## 2016-03-31 NOTE — Progress Notes (Signed)
F/u CTH post-tPA reviewed. This shows a small hematoma in the L temporal lobe. As such, will defer initiation of antiplatelet therapy at this time.

## 2016-03-31 NOTE — Progress Notes (Signed)
Inpatient Rehabilitation  PT and SLP have evaluated pt. and are recommending IP Rehab.  Patient was screened by Gerlean Ren for appropriateness for an Inpatient Acute Rehab consult. Note pt's workup is underway for left brain infarct. Pt. was on CIR in 2017 .   At this time, we are recommending Inpatient Rehab consult.   [place] Admissions Coordinator Cell [phone] Office [phone]

## 2016-03-31 NOTE — Progress Notes (Signed)
STROKE TEAM PROGRESS NOTE   HISTORY OF PRESENT ILLNESS (per record) This is an 83-yo RH man who presents to the ED for the evaluation of speech changes. History is obtained from the patient's wife and daughter who are present at the bedside. The patient has expressive aphasia and is unable to provide any information.   His wife reports that he went to his rehab appointment this morning and was in his usual state of health. They were on their way home and stopped at the grocery store. He stayed in the car while she went inside. When she returned, she says that he was unable to figure out how to unlock the car door. When she got in the car she noted that his language was not making sense. She brought him to the ED where CODE STROKE was activated. I met the patient in the ED. He was taken for an emergent CT of the head which did not reveal any acute abnormality. He was noted to have expressive aphasia with mild dysarthria. NIHSS was 6 but 2 points were for inability to lift his R leg off the bed because of chronic weakness following R THA in September 2017.   After discussion with the patient's wife and daughter, the decision was made to proceed with tPA. He was given a bolus of 7 mg followed by an infusion of 62 mg. The bolus was initially started at 1321. However, due to air microbubbles in the line the tPA would not infuse. After exhaustive attempts to eliminate the bubbles, a new bottle of tPA was mixed by pharmacy. In the meantime, the patient developed severe HTN with SBP 260s, DBP 200s. He was given labetalol 10 mg IV and nicardipine drip was started with excellent response. BP initially dropped to 118/81 with no change in his neurologic status. His nicardipine was stopped and BP has remained SBP 140s, DBP 80s. His tPA was then infused with bolus given at 1357 and infusion started at 1358. His neurologic exam has remained stable.   Last known well: 1221 NHISS score: 6 tPA given?:  Yes   SUBJECTIVE (INTERVAL HISTORY) His wife is at the bedside.  Overall he feels his condition is unchanged. Pt still has partial expressive and receptive aphasia. MRI and MRA pending. Pt seems fidget, will need low dose ativan for MRI.   Pt admitted in 11/2015 with right hip fracture, during hospitalization, he was found to have afib, started on eliquis on discharge. However, as per wife, pt was on eliquis until his orthopedic surgeon took it off. He was put on ASA and plavix. No bleeding as per wife after discharge in 11/2015.   OBJECTIVE Temp:  [97.7 F (36.5 C)-98.9 F (37.2 C)] 97.9 F (36.6 C) (01/27 0755) Pulse Rate:  [40-83] 68 (01/27 0800) Cardiac Rhythm: Normal sinus rhythm (01/27 0800) Resp:  [13-99] 17 (01/27 0800) BP: (75-265)/(53-201) 119/55 (01/27 0800) SpO2:  [84 %-100 %] 90 % (01/27 0800) Weight:  [74.5 kg (164 lb 3.9 oz)] 74.5 kg (164 lb 3.9 oz) (01/26 2000)  CBC:   Recent Labs Lab 03/30/16 1249 03/30/16 1304  WBC 10.7*  --   NEUTROABS 7.2  --   HGB 13.1 13.6  HCT 38.7* 40.0  MCV 91.5  --   PLT 392  --     Basic Metabolic Panel:   Recent Labs Lab 03/30/16 1249 03/30/16 1304  NA 132* 132*  K 4.0 4.0  CL 100* 99*  CO2 19*  --   GLUCOSE  97 96  BUN 14 15  CREATININE 1.13 1.10  CALCIUM 9.9  --     Lipid Panel:     Component Value Date/Time   CHOL 77 03/31/2016 0353   TRIG 37 03/31/2016 0353   HDL 32 (L) 03/31/2016 0353   CHOLHDL 2.4 03/31/2016 0353   VLDL 7 03/31/2016 0353   LDLCALC 38 03/31/2016 0353   HgbA1c:  Lab Results  Component Value Date   HGBA1C 5.6 06/17/2015   Urine Drug Screen:     Component Value Date/Time   LABOPIA NONE DETECTED 06/16/2015 2037   COCAINSCRNUR NONE DETECTED 06/16/2015 2037   LABBENZ NONE DETECTED 06/16/2015 2037   AMPHETMU NONE DETECTED 06/16/2015 2037   THCU NONE DETECTED 06/16/2015 2037   LABBARB NONE DETECTED 06/16/2015 2037      IMAGING I have personally reviewed the radiological images below  and agree with the radiology interpretations.  Ct Head Code Stroke W/o Cm 03/30/2016 1. No evidence of acute intracranial abnormality.  2. ASPECTS is 10.  3. Moderate chronic small vessel ischemic disease and chronic left thalamic infarct.   MRI / MRA Head / Brain - pending 03/31/2016  TTE  11/28/2015 - Left ventricle: The cavity size was normal. Wall thickness was   increased in a pattern of mild LVH. Systolic function was   vigorous. The estimated ejection fraction was in the range of 65% to 70%. - Right ventricle: The cavity size was mildly dilated.  CUS pending   PHYSICAL EXAM  Temp:  [97.7 F (36.5 C)-98.9 F (37.2 C)] 97.9 F (36.6 C) (01/27 0755) Pulse Rate:  [40-83] 62 (01/27 1000) Resp:  [13-99] 18 (01/27 1000) BP: (75-265)/(53-201) 106/56 (01/27 1000) SpO2:  [84 %-100 %] 100 % (01/27 1000) Weight:  [164 lb 3.9 oz (74.5 kg)] 164 lb 3.9 oz (74.5 kg) (01/26 2000)  General - Well nourished, well developed, mild agitation.  Ophthalmologic - Fundi not visualized due to noncooperation.  Cardiovascular - Regular rate and rhythm.  Mental Status -  Awake alert, not able to answer orientation questions Partial expressive aphasia, able to have simple 3-4 words sentences but not able to name or repeat. Partial receptive aphasia, able to follow some midline and peripheral commands, but not all of them. No neglect  Cranial Nerves II - XII - II - Visual field less blinking to visual threat on the right. III, IV, VI - Extraocular movements intact. V - Facial sensation intact bilaterally. VII - Facial movement intact bilaterally. VIII - Hearing & vestibular intact bilaterally. X - Palate elevates symmetrically. XI - Chin turning & shoulder shrug intact bilaterally. XII - Tongue protrusion intact.  Motor Strength - The patient's strength was equal symmetrical in all extremities at least 4/5 and pronator drift was absent. However, pt seems to have right hip pain after surgery  which limited right leg movement.  Bulk was normal and fasciculations were absent.   Motor Tone - Muscle tone was assessed at the neck and appendages and was normal.  Reflexes - The patient's reflexes were 1+ in all extremities and he had no pathological reflexes.  Sensory - not cooperative    Coordination - not cooperative.  Tremor was absent.  Gait and Station - not tested.   ASSESSMENT/PLAN [name] is a 83 y.o. male with history of DM, previous stroke, HTN, HLD, afib 11/28/2015 post right hip fracture surgery, and CAD s/p PCI, presenting with speech difficulties and aphasia.  He received IV t-PA Friday, 03/30/2016 at  1330.  Stroke:  Dominant left brain infarct embolic pattern most likely secondary to afib not on [facility]  Resultant  Partial receptive and expressive aphasia  MRI - pending  MRA - pending  Carotid Doppler - pending  2D Echo - 11/28/2015 - EF 65 - 70%. No cardiac source of emboli identified.  LDL - 38  HgbA1c - pending  VTE prophylaxis - SCDs Diet Heart Room service appropriate? Yes; Fluid consistency: Thin  aspirin 81 mg daily and clopidogrel 75 mg daily prior to admission, now on No antithrombotic s/p TPA. Will need to consider resume anticoagulation depending on MRI findings.   Patient counseled to be compliant with his antithrombotic medications  Ongoing aggressive stroke risk factor management  Therapy recommendations:  pending  Disposition: Pending  Paroxysmal afib   Found in 11/2015 during admission for right hip fracture  On eliquis on discharge  Discontinued by orthopedic surgeon (? Thinking post op afib)  PTA on ASA and plavix  No bleeding on eliquis as per wife  Will need to consider resume anticoagulation depending on MRI findings.   Hx of stroke  06/2015 - b/l thalamic, right mesial temporal lobe - MRA and CTA head left PCA occlusion and right PCA high grade stenosis - MRA CTA neck negative - EEG neg - EF 55-60% - LDL 53 A1C  5.6  Stroke concerning for embolic event   outpt 30 day event monitoring neg for afib  Hypertension  Blood pressure tends to run low  Permissive hypertension (OK if < 180/105) but gradually normalize in 5-7 days  Long-term BP goal normotensive  Hyperlipidemia  Home meds: Lipitor 80 mg daily resumed in hospital  LDL 38, goal < 70  Continue statin at discharge  Diabetes  HgbA1c pending, goal < 7.0  Controlled  SSI  CBG monitoring  Other Stroke Risk Factors  Advanced age  Former cigarette smoker  ETOH use, advised to drink no more than 1 drink per day  Coronary artery disease  Other Active Problems  Hyponatremia - 132  Right hip fracture - s/p surgery  Hx of anemia - now Hb 13.6  Hospital day # 1  This patient is critically ill due to stroke s/p tPA, pAfib, CAD, aphasia and at significant risk of neurological worsening, death form recurrent stroke, hemorrhagic transformation, afib RVR, heart failure. This patient's care requires constant monitoring of vital signs, hemodynamics, respiratory and cardiac monitoring, review of multiple databases, neurological assessment, discussion with family, other specialists and medical decision making of high complexity. I spent 40 minutes of neurocritical care time in the care of this patient.  Rosalin Hawking, MD PhD Stroke Neurology 03/31/2016 10:38 AM   To contact Stroke Continuity provider, please refer to [email or web address] After hours, contact General Neurology
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
## 2016-04-01 DIAGNOSIS — Z8781 Personal history of (healed) traumatic fracture: Secondary | ICD-10-CM

## 2016-04-01 DIAGNOSIS — I63412 Cerebral infarction due to embolism of left middle cerebral artery: Principal | ICD-10-CM

## 2016-04-01 DIAGNOSIS — I638 Other cerebral infarction: Secondary | ICD-10-CM

## 2016-04-01 LAB — CBC
HCT: 31.6 % — ABNORMAL LOW (ref 39.0–52.0)
Hemoglobin: 10.6 g/dL — ABNORMAL LOW (ref 13.0–17.0)
MCH: 30.6 pg (ref 26.0–34.0)
MCHC: 33.5 g/dL (ref 30.0–36.0)
MCV: 91.3 fL (ref 78.0–100.0)
Platelets: 299 10*3/uL (ref 150–400)
RBC: 3.46 MIL/uL — ABNORMAL LOW (ref 4.22–5.81)
RDW: 15 % (ref 11.5–15.5)
WBC: 10.7 10*3/uL — ABNORMAL HIGH (ref 4.0–10.5)

## 2016-04-01 LAB — VITAMIN B12: Vitamin B-12: 1477 pg/mL — ABNORMAL HIGH (ref 180–914)

## 2016-04-01 LAB — GLUCOSE, CAPILLARY
Glucose-Capillary: 101 mg/dL — ABNORMAL HIGH (ref 65–99)
Glucose-Capillary: 102 mg/dL — ABNORMAL HIGH (ref 65–99)
Glucose-Capillary: 105 mg/dL — ABNORMAL HIGH (ref 65–99)
Glucose-Capillary: 124 mg/dL — ABNORMAL HIGH (ref 65–99)

## 2016-04-01 LAB — BASIC METABOLIC PANEL
Anion gap: 5 (ref 5–15)
BUN: 7 mg/dL (ref 6–20)
CO2: 21 mmol/L — ABNORMAL LOW (ref 22–32)
Calcium: 8.3 mg/dL — ABNORMAL LOW (ref 8.9–10.3)
Chloride: 106 mmol/L (ref 101–111)
Creatinine, Ser: 0.83 mg/dL (ref 0.61–1.24)
GFR calc Af Amer: >60 mL/min (ref >60)
GFR calc non Af Amer: >60 mL/min (ref >60)
Glucose, Bld: 94 mg/dL (ref 65–99)
Potassium: 3.7 mmol/L (ref 3.5–5.1)
Sodium: 132 mmol/L — ABNORMAL LOW (ref 135–145)

## 2016-04-01 LAB — TSH: TSH: 2.423 u[IU]/mL (ref 0.350–4.500)

## 2016-04-01 LAB — HEMOGLOBIN A1C
Hgb A1c MFr Bld: 5 % (ref 4.8–5.6)
Mean Plasma Glucose: 97 mg/dL

## 2016-04-01 MED ORDER — PANTOPRAZOLE SODIUM 40 MG PO TBEC
40.0000 mg | DELAYED_RELEASE_TABLET | Freq: Every day | ORAL | Status: DC
Start: 2016-04-01 — End: 2016-04-04
  Administered 2016-04-01 – 2016-04-04 (×4): 40 mg via ORAL
  Filled 2016-04-01 (×4): qty 1

## 2016-04-01 MED ORDER — ADULT MULTIVITAMIN W/MINERALS CH
1.0000 | ORAL_TABLET | Freq: Every day | ORAL | Status: DC
  Administered 2016-04-01 – 2016-04-04 (×4): 1 via ORAL
  Filled 2016-04-01 (×5): qty 1

## 2016-04-01 MED ORDER — ATORVASTATIN CALCIUM 40 MG PO TABS
40.0000 mg | ORAL_TABLET | Freq: Every day | ORAL | Status: DC
  Administered 2016-04-01 – 2016-04-04 (×4): 40 mg via ORAL
  Filled 2016-04-01 (×4): qty 1

## 2016-04-01 NOTE — Progress Notes (Signed)
New Admission Note:  Arrival Method: wheechair Mental Orientation: alert to self & location; disoriented to situation & time  Telemetry: afib/ sy/ box13 Assessment: Completed Skin: ecchymosis on arms bilaterally IV: L hand, R AC , SL Pain:0/10 Tubes: n/a Safety Measures: Safety Fall Prevention , discussed. Admission: Completed 5c09: Patient has been orientated to the room, unit and the staff. Family: visiting   Orders have been reviewed and implemented. Will continue to monitor the patient. Call light has been placed within reach and bed alarm has been activated.   Arta Silence ,RN

## 2016-04-01 NOTE — Progress Notes (Signed)
STROKE TEAM PROGRESS NOTE   SUBJECTIVE (INTERVAL HISTORY) His wife is at the bedside.  Overall his condition is much improved from yesterday. He is sitting in bed for breakfast and he is feeding himself. Awake alert and no agitation. MRI showed left temporal infarct with hemorrhagic transformation. Will hold off antiplatelet or anticoagulation at this time.    OBJECTIVE Temp:  [97.9 F (36.6 C)-98.7 F (37.1 C)] 98.2 F (36.8 C) (01/28 0400) Pulse Rate:  [50-86] 86 (01/28 0800) Cardiac Rhythm: Normal sinus rhythm (01/28 0800) Resp:  [11-30] 16 (01/28 0800) BP: (92-137)/(38-82) 97/82 (01/28 0800) SpO2:  [93 %-100 %] 96 % (01/28 0800)  CBC:   Recent Labs Lab 03/30/16 1249 03/30/16 1304 04/01/16 0730  WBC 10.7*  --  10.7*  NEUTROABS 7.2  --   --   HGB 13.1 13.6 10.6*  HCT 38.7* 40.0 31.6*  MCV 91.5  --  91.3  PLT 392  --  [id number]    Basic Metabolic Panel:   Recent Labs Lab 03/30/16 1249 03/30/16 1304 04/01/16 0730  NA 132* 132* 132*  K 4.0 4.0 3.7  CL 100* 99* 106  CO2 19*  --  21*  GLUCOSE 97 96 94  BUN 14 15 7   CREATININE 1.13 1.10 0.83  CALCIUM 9.9  --  8.3*    Lipid Panel:     Component Value Date/Time   CHOL 77 03/31/2016 0353   TRIG 37 03/31/2016 0353   HDL 32 (L) 03/31/2016 0353   CHOLHDL 2.4 03/31/2016 0353   VLDL 7 03/31/2016 0353   LDLCALC 38 03/31/2016 0353   HgbA1c:  Lab Results  Component Value Date   HGBA1C 5.6 06/17/2015   Urine Drug Screen:     Component Value Date/Time   LABOPIA NONE DETECTED 06/16/2015 2037   COCAINSCRNUR NONE DETECTED 06/16/2015 2037   LABBENZ NONE DETECTED 06/16/2015 2037   AMPHETMU NONE DETECTED 06/16/2015 2037   THCU NONE DETECTED 06/16/2015 2037   LABBARB NONE DETECTED 06/16/2015 2037      IMAGING I have personally reviewed the radiological images below and agree with the radiology interpretations. Blue text is my interpretation  Ct Head Code Stroke W/o Cm 03/30/2016 1. No evidence of acute intracranial  abnormality.  2. ASPECTS is 10.  3. Moderate chronic small vessel ischemic disease and chronic left thalamic infarct.    MRI Head  03/31/2016 Limited examination.  Marked motion degrades image quality. No acute abnormality. But able to tell there is left temporal acute infarct with hemorrhagic transformation.   CTA Head and Neck 03/31/1016 1. Left temporal lobe hemorrhagic infarct. The parenchymal hematoma measures 4 cc volume. Subdural extension in the middle cranial fossa without significant mass effect. 2. No emergent large vessel occlusion. 3. Atherosclerosis with 60-70% proximal right ICA and 40-50% proximal left ICA stenosis. Mild moderate right supraclinoid ICA stenosis. No significant stenosis in the posterior circulation. 4. Pulmonary fibrosis.   TTE  11/28/2015 - Left ventricle: The cavity size was normal. Wall thickness was   increased in a pattern of mild LVH. Systolic function was   vigorous. The estimated ejection fraction was in the range of 65% to 70%. - Right ventricle: The cavity size was mildly dilated.    PHYSICAL EXAM  Temp:  [97.9 F (36.6 C)-98.7 F (37.1 C)] 98.2 F (36.8 C) (01/28 0400) Pulse Rate:  [50-86] 86 (01/28 0800) Resp:  [11-30] 16 (01/28 0800) BP: (92-137)/(38-82) 97/82 (01/28 0800) SpO2:  [93 %-100 %] 96 % (01/28 0800)  General - Well nourished, well developed, no acute distress  Ophthalmologic - Fundi not visualized due to noncooperation.  Cardiovascular - Regular rate and rhythm.  Mental Status -  Awake alert, orientated to place, and people, but not to time Follows midline and peripheral commands, no significant expressive aphasia, naming 2/3 and able to repeat simple sentences. Mild dysarthria   Cranial Nerves II - XII - II - Visual field grossly intact III, IV, VI - Extraocular movements intact. V - Facial sensation intact bilaterally. VII - Facial movement intact bilaterally. VIII - hard of hearing & vestibular intact  bilaterally. X - Palate elevates symmetrically. XI - Chin turning & shoulder shrug intact bilaterally. XII - Tongue protrusion intact.  Motor Strength - The patient's strength was equal symmetrical in all extremities at least 4/5 and pronator drift was absent. However, pt seems to have right hip pain after surgery which limited right leg movement.  Bulk was normal and fasciculations were absent.   Motor Tone - Muscle tone was assessed at the neck and appendages and was normal.  Reflexes - The patient's reflexes were 1+ in all extremities and he had no pathological reflexes.  Sensory - symmetrical   Coordination - no ataxia or dysmetria but slow on action.  Tremor was absent.  Gait and Station - deferred.   ASSESSMENT/PLAN Mr. IVICA RISENHOOVER is a 83 y.o. male with history of DM, previous stroke, HTN, HLD, afib 11/28/2015 post right hip fracture surgery, and CAD s/p PCI, presenting with speech difficulties and aphasia.  He received IV t-PA Friday, 03/30/2016 at 1330.  Stroke:  Dominant left temporal lobe infarct with hemorrhagic transformation, embolic pattern most likely secondary to afib not on AC  Resultant aphasia largely resolved  MRI - Marked motion degrades image quality. Left temporal infarct with hematoma.  MRA - not tolerate  CTA H&N - Left temporal lobe hemorrhagic infarct with subdural extension. 60-70% proximal right ICA, 40-50% left ICA  2D Echo - 11/28/2015 - EF 65 - 70%. No cardiac source of emboli identified.  LDL - 38  HgbA1c - pending  VTE prophylaxis - SCDs Diet Heart Room service appropriate? Yes; Fluid consistency: Thin  aspirin 81 mg daily and clopidogrel 75 mg daily prior to admission, now on No antithrombotic due to hemorrhagic transformation. Consider anticoagulation once blood absorbed.  Patient counseled to be compliant with his antithrombotic medications  Ongoing aggressive stroke risk factor management  Therapy recommendations:  CIR - MD consult  has been ordered.  Disposition: Pending  Paroxysmal afib   Found in 11/2015 during admission for right hip fracture  On eliquis on discharge  Discontinued by orthopedic surgeon (? Thinking post op afib)  PTA on ASA and plavix  No bleeding on eliquis as per wife  Will need to resume anticoagulation once hemorrhagic transformation absorbed.   Hx of stroke  06/2015 - b/l thalamic, right mesial temporal lobe - MRA and CTA head left PCA occlusion and right PCA high grade stenosis - MRA CTA neck negative - EEG neg - EF 55-60% - LDL 53 A1C 5.6  Stroke concerning for embolic event   outpt 30 day event monitoring neg for afib  Hypertension  Blood pressure tends to run low  Permissive hypertension (OK if < 180/105) but gradually normalize in 5-7 days  Long-term BP goal normotensive  Hyperlipidemia  Home meds: Lipitor 80 mg daily resumed in hospital  LDL 38, goal < 70  Decrease lipitor to 40mg   Continue statin at discharge  Diabetes  HgbA1c pending, goal < 7.0  Controlled  SSI  CBG monitoring  Other Stroke Risk Factors  Advanced age  Former cigarette smoker  ETOH use, advised to drink no more than 1 drink per day  Coronary artery disease  Other Active Problems  Hyponatremia - 132-132  Right hip fracture - s/p surgery  Hx of anemia - now Hb 13.6->10.6  Hospital day # 2  This patient is critically ill due to stroke s/p tPA, pAfib, CAD, aphasia and at significant risk of neurological worsening, death form recurrent stroke, hemorrhagic transformation, afib RVR, heart failure. This patient's care requires constant monitoring of vital signs, hemodynamics, respiratory and cardiac monitoring, review of multiple databases, neurological assessment, discussion with family, other specialists and medical decision making of high complexity. I spent 35 minutes of neurocritical care time in the care of this patient.  Rosalin Hawking, MD PhD Stroke Neurology 04/01/2016 9:51  AM   To contact Stroke Continuity provider, please refer to [email or web address] After hours, contact General Neurology

## 2016-04-02 DIAGNOSIS — I951 Orthostatic hypotension: Secondary | ICD-10-CM

## 2016-04-02 DIAGNOSIS — I480 Paroxysmal atrial fibrillation: Secondary | ICD-10-CM

## 2016-04-02 DIAGNOSIS — R58 Hemorrhage, not elsewhere classified: Secondary | ICD-10-CM

## 2016-04-02 DIAGNOSIS — I2510 Atherosclerotic heart disease of native coronary artery without angina pectoris: Secondary | ICD-10-CM

## 2016-04-02 DIAGNOSIS — I619 Nontraumatic intracerebral hemorrhage, unspecified: Secondary | ICD-10-CM | POA: Insufficient documentation

## 2016-04-02 DIAGNOSIS — I10 Essential (primary) hypertension: Secondary | ICD-10-CM

## 2016-04-02 DIAGNOSIS — Z96641 Presence of right artificial hip joint: Secondary | ICD-10-CM

## 2016-04-02 DIAGNOSIS — I639 Cerebral infarction, unspecified: Secondary | ICD-10-CM

## 2016-04-02 DIAGNOSIS — E876 Hypokalemia: Secondary | ICD-10-CM

## 2016-04-02 DIAGNOSIS — E1151 Type 2 diabetes mellitus with diabetic peripheral angiopathy without gangrene: Secondary | ICD-10-CM

## 2016-04-02 DIAGNOSIS — R0989 Other specified symptoms and signs involving the circulatory and respiratory systems: Secondary | ICD-10-CM

## 2016-04-02 DIAGNOSIS — D62 Acute posthemorrhagic anemia: Secondary | ICD-10-CM | POA: Diagnosis present

## 2016-04-02 DIAGNOSIS — I959 Hypotension, unspecified: Secondary | ICD-10-CM

## 2016-04-02 DIAGNOSIS — E871 Hypo-osmolality and hyponatremia: Secondary | ICD-10-CM

## 2016-04-02 DIAGNOSIS — D649 Anemia, unspecified: Secondary | ICD-10-CM

## 2016-04-02 LAB — BASIC METABOLIC PANEL
Anion gap: 5 (ref 5–15)
BUN: 9 mg/dL (ref 6–20)
CO2: 22 mmol/L (ref 22–32)
Calcium: 8.3 mg/dL — ABNORMAL LOW (ref 8.9–10.3)
Chloride: 102 mmol/L (ref 101–111)
Creatinine, Ser: 0.93 mg/dL (ref 0.61–1.24)
GFR calc Af Amer: >60 mL/min (ref >60)
GFR calc non Af Amer: >60 mL/min (ref >60)
Glucose, Bld: 107 mg/dL — ABNORMAL HIGH (ref 65–99)
Potassium: 3.4 mmol/L — ABNORMAL LOW (ref 3.5–5.1)
Sodium: 129 mmol/L — ABNORMAL LOW (ref 135–145)

## 2016-04-02 LAB — GLUCOSE, CAPILLARY
Glucose-Capillary: 104 mg/dL — ABNORMAL HIGH (ref 65–99)
Glucose-Capillary: 112 mg/dL — ABNORMAL HIGH (ref 65–99)
Glucose-Capillary: 120 mg/dL — ABNORMAL HIGH (ref 65–99)

## 2016-04-02 LAB — CBC
HCT: 31.5 % — ABNORMAL LOW (ref 39.0–52.0)
Hemoglobin: 10.3 g/dL — ABNORMAL LOW (ref 13.0–17.0)
MCH: 29.9 pg (ref 26.0–34.0)
MCHC: 32.7 g/dL (ref 30.0–36.0)
MCV: 91.6 fL (ref 78.0–100.0)
Platelets: 315 10*3/uL (ref 150–400)
RBC: 3.44 MIL/uL — ABNORMAL LOW (ref 4.22–5.81)
RDW: 14.8 % (ref 11.5–15.5)
WBC: 10.3 10*3/uL (ref 4.0–10.5)

## 2016-04-02 NOTE — Progress Notes (Signed)
Physical Therapy Treatment Patient Details Name: Herbert Marquez MRN: [id number] DOB: October 28, 1927 Today's Date: 04/02/2016    History of Present Illness (P) 83 y.o.malewith history of DM, previous stroke, HTN, HLD, afib 11/28/2015 post right hip fracture surgery, and CAD s/p PCI,presenting with speechdifficulties and aphasia. HereceivedIV tPA Friday, 03/30/2016 at 1330. MRI + for acute stroke involving the L MCAterritory.    PT Comments    Patient reports pain in R knee upon arrival; wife stated nurse provided medication prior to treatment. He is able to assist some with transfer but still requires max A to initiate trunk elevation off bed. Pt refused to ambulate today due to extreme fatigue and stated he wanted to go back to bed in order to take a nap.  Patient is pleasant to work with but extremely limited due to pain and fatigue. Would benefit from static standing and/or LE strengthening exercises to help prepare for ambulation when patient will allow. PT will continue to progress patient toward goals in preparation for d/c to CIR   Follow Up Recommendations  CIR;Supervision/Assistance - 24 hour     Equipment Recommendations  None recommended by PT    Recommendations for Other Services       Precautions / Restrictions Precautions Precautions: (P) Fall Restrictions Weight Bearing Restrictions: (P) No    Mobility  Bed Mobility Overal bed mobility: Needs Assistance Bed Mobility: Rolling;Sidelying to Sit Rolling: Mod assist Sidelying to sit: Max assist Supine to sit: Max assist;+2 for physical assistance Sit to supine: +2 for physical assistance;Mod assist;Max assist   General bed mobility comments: Mod assist +2 for bed mobility elevation to upright and with OT assist to swing off bed with v/c for using elbow to assist. Pt very limited due to fatigue but able to help some. Max A +2 to shift pt up in bed at end of tx.  Transfers Overall transfer level: Needs  assistance Equipment used:  Charlaine Dalton)   Sit to Stand: +2 physical assistance;Max assist;Mod assist         General transfer comment: Max assist of 2 to power up from bed to standing due to pt c/o fatigue. Pt limited to put much weight on R LE .  Mod A+2 from elevated surface off stedy to standing x2.  Patient quickly fatigued by end of treatment, requiring max A +2 for third sit to stand trial off elevated surface.   Ambulation/Gait             General Gait Details: pt refused to ambulate today   Stairs            Wheelchair Mobility    Modified Rankin (Stroke Patients Only) Modified Rankin (Stroke Patients Only) Pre-Morbid Rankin Score: Moderate disability Modified Rankin: Severe disability     Balance Overall balance assessment: Needs assistance Sitting-balance support: Bilateral upper extremity supported Sitting balance-Leahy Scale: Fair Sitting balance - Comments: able to sit without support at times during functional tasks but requires bilateral UE support once fatigue sets in.  Cuing for upright posture     Standing balance-Leahy Scale: Zero Standing balance comment: pt able to perform static standing in stedy with bilateral UE support and for safety.                     Cognition Arousal/Alertness: (P) Awake/alert Behavior During Therapy: (P) Flat affect Overall Cognitive Status: (P) Within Functional Limits for tasks assessed  General Comments: (P) Pt follows commands well and with good awareness of safety and deficits. Per wife's report, pt is at baseline cognitively.    Exercises      General Comments        Pertinent Vitals/Pain Pain Assessment: (P) Faces Faces Pain Scale: (P) Hurts even more Pain Location: (P) R knee Pain Descriptors / Indicators: (P) Grimacing;Guarding Pain Intervention(s): (P) Monitored during session;Premedicated before session    [place] expects to be discharged to:: (P)  Private residence Living Arrangements: (P) Spouse/significant other Available Help at Discharge: (P) Family;Available 24 hours/day Type of Home: (P) House Home Access: (P) Ramped entrance   Home Layout: (P) One level Home Equipment: (P) Walker - 2 wheels;Cane - single point;Wheelchair - Banker;Shower seat;Grab bars - tub/shower Additional Comments: (P) Pt at CIR 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 after CVA    Prior Function Level of Independence: (P) Independent with assistive device(s)      Comments: (P) ambulates with RW short distances, uses w/c    PT Goals (current goals can now be found in the care plan section) Acute Rehab PT Goals Patient Stated Goal: to take a nap PT Goal Formulation: With patient/family Potential to Achieve Goals: Good Progress towards PT goals: Progressing toward goals    Frequency    Min 3X/week      PT Plan Current plan remains appropriate    Co-evaluation   Reason for Co-Treatment: (P) For patient/therapist safety;To address functional/ADL transfers   OT goals addressed during session: (P) ADL's and self-care     End of Session Equipment Utilized During Treatment: Gait belt Activity Tolerance: Patient limited by fatigue;Patient limited by pain Patient left: in bed;with call bell/phone within reach;with family/visitor present     Time: [id number] PT Time Calculation (min) (ACUTE ONLY): 33 min  Charges:  $Therapeutic Activity: 8-22 mins                    G Codes:      [facility] 04/28/2016, 4:24 PM [place], [place] Pager [phone]

## 2016-04-02 NOTE — Progress Notes (Signed)
STROKE TEAM PROGRESS NOTE   SUBJECTIVE (INTERVAL HISTORY) His wife is at the bedside.  Overall his condition continues to improve. Able to repeat and name well today. CIR is following pt for admission.    OBJECTIVE Temp:  [97.6 F (36.4 C)-99 F (37.2 C)] 98.6 F (37 C) (01/29 1700) Pulse Rate:  [69-87] 69 (01/29 1700) Cardiac Rhythm: Atrial fibrillation (01/29 1200) Resp:  [16-18] 16 (01/29 1700) BP: (99-125)/(50-69) 121/66 (01/29 1700) SpO2:  [94 %-100 %] 94 % (01/29 1700)  CBC:   Recent Labs Lab 03/30/16 1249  04/01/16 0730 04/02/16 0212  WBC 10.7*  --  10.7* 10.3  NEUTROABS 7.2  --   --   --   HGB 13.1  < > 10.6* 10.3*  HCT 38.7*  < > 31.6* 31.5*  MCV 91.5  --  91.3 91.6  PLT 392  --  299 315  < > = values in this interval not displayed.  Basic Metabolic Panel:   Recent Labs Lab 04/01/16 0730 04/02/16 0212  NA 132* 129*  K 3.7 3.4*  CL 106 102  CO2 21* 22  GLUCOSE 94 107*  BUN 7 9  CREATININE 0.83 0.93  CALCIUM 8.3* 8.3*    Lipid Panel:     Component Value Date/Time   CHOL 77 03/31/2016 0353   TRIG 37 03/31/2016 0353   HDL 32 (L) 03/31/2016 0353   CHOLHDL 2.4 03/31/2016 0353   VLDL 7 03/31/2016 0353   LDLCALC 38 03/31/2016 0353   HgbA1c:  Lab Results  Component Value Date   HGBA1C 5.0 03/31/2016   Urine Drug Screen:     Component Value Date/Time   LABOPIA NONE DETECTED 06/16/2015 2037   COCAINSCRNUR NONE DETECTED 06/16/2015 2037   LABBENZ NONE DETECTED 06/16/2015 2037   AMPHETMU NONE DETECTED 06/16/2015 2037   THCU NONE DETECTED 06/16/2015 2037   LABBARB NONE DETECTED 06/16/2015 2037      IMAGING I have personally reviewed the radiological images below and agree with the radiology interpretations. Blue text is my interpretation  Ct Head Code Stroke W/o Cm 03/30/2016 1. No evidence of acute intracranial abnormality.  2. ASPECTS is 10.  3. Moderate chronic small vessel ischemic disease and chronic left thalamic infarct.    MRI Head   03/31/2016 Limited examination.  Marked motion degrades image quality. No acute abnormality. But able to tell there is left temporal acute infarct with hemorrhagic transformation.   CTA Head and Neck 03/31/1016 1. Left temporal lobe hemorrhagic infarct. The parenchymal hematoma measures 4 cc volume. Subdural extension in the middle cranial fossa without significant mass effect. 2. No emergent large vessel occlusion. 3. Atherosclerosis with 60-70% proximal right ICA and 40-50% proximal left ICA stenosis. Mild moderate right supraclinoid ICA stenosis. No significant stenosis in the posterior circulation. 4. Pulmonary fibrosis.   TTE  11/28/2015 - Left ventricle: The cavity size was normal. Wall thickness was   increased in a pattern of mild LVH. Systolic function was   vigorous. The estimated ejection fraction was in the range of 65% to 70%. - Right ventricle: The cavity size was mildly dilated.    PHYSICAL EXAM  Temp:  [97.6 F (36.4 C)-99 F (37.2 C)] 98.6 F (37 C) (01/29 1700) Pulse Rate:  [69-87] 69 (01/29 1700) Resp:  [16-18] 16 (01/29 1700) BP: (99-125)/(50-69) 121/66 (01/29 1700) SpO2:  [94 %-100 %] 94 % (01/29 1700)  General - Well nourished, well developed, no acute distress  Ophthalmologic - Fundi not visualized due to  noncooperation.  Cardiovascular - Regular rate and rhythm.  Mental Status -  Awake alert, orientated to place, and people, but not to time Follows midline and peripheral commands, no significant expressive aphasia, naming intact and able to repeat simple sentences. Mild dysarthria   Cranial Nerves II - XII - II - Visual field grossly intact III, IV, VI - Extraocular movements intact. V - Facial sensation intact bilaterally. VII - Facial movement intact bilaterally. VIII - hard of hearing & vestibular intact bilaterally. X - Palate elevates symmetrically. XI - Chin turning & shoulder shrug intact bilaterally. XII - Tongue protrusion  intact.  Motor Strength - The patient's strength was equal symmetrical in all extremities at least 4/5 and pronator drift was absent. However, pt seems to have right hip pain after surgery which limited right leg movement.  Bulk was normal and fasciculations were absent.   Motor Tone - Muscle tone was assessed at the neck and appendages and was normal.  Reflexes - The patient's reflexes were 1+ in all extremities and he had no pathological reflexes.  Sensory - symmetrical   Coordination - no ataxia or dysmetria but slow on action.  Tremor was absent.  Gait and Station - deferred.   ASSESSMENT/PLAN Mr. MARQUEZE PEA is a 83 y.o. male with history of DM, previous stroke, HTN, HLD, afib 11/28/2015 post right hip fracture surgery, and CAD s/p PCI, presenting with speech difficulties and aphasia.  He received IV t-PA Friday, 03/30/2016 at 1330.  Stroke:  Dominant left temporal lobe infarct with hemorrhagic transformation, embolic pattern most likely secondary to afib not on AC  Resultant aphasia largely resolved  MRI - Marked motion degrades image quality. Left temporal infarct with hematoma.  MRA - not tolerate  CTA H&N - Left temporal lobe hemorrhagic infarct with subdural extension. 60-70% proximal right ICA, 40-50% left ICA  2D Echo - 11/28/2015 - EF 65 - 70%. No cardiac source of emboli identified.  LDL - 38  HgbA1c - pending  VTE prophylaxis - SCDs Diet Heart Room service appropriate? Yes; Fluid consistency: Thin  aspirin 81 mg daily and clopidogrel 75 mg daily prior to admission, now on No antithrombotic due to hemorrhagic transformation. Consider to start ASA at day 7 post stroke and then anticoagulation once blood absorbed.  Patient counseled to be compliant with his antithrombotic medications  Ongoing aggressive stroke risk factor management  Therapy recommendations:  CIR - MD consult has been ordered.  Disposition: Pending  Paroxysmal afib   Found in 11/2015  during admission for right hip fracture  On eliquis on discharge  Discontinued by orthopedic surgeon (? Thinking post op afib)  PTA on ASA and plavix  No bleeding on eliquis as per wife  Will need to start ASA at day 7 post stroke and resume anticoagulation once hemorrhagic transformation absorbed.   Hx of stroke  06/2015 - b/l thalamic, right mesial temporal lobe - MRA and CTA head left PCA occlusion and right PCA high grade stenosis - MRA CTA neck negative - EEG neg - EF 55-60% - LDL 53 A1C 5.6  Stroke concerning for embolic event   outpt 30 day event monitoring neg for afib  Hypertension  Blood pressure tends to run low  Permissive hypertension (OK if < 180/105) but gradually normalize in 5-7 days  Long-term BP goal normotensive  Hyperlipidemia  Home meds: Lipitor 80 mg daily resumed in hospital  LDL 38, goal < 70  Decrease lipitor to 40mg   Continue statin at discharge  Diabetes  HgbA1c pending, goal < 7.0  Controlled  SSI  CBG monitoring  Anemia  Hb 13.6->10.6->10.3  CBC monitoring  Stool guaiac pending  Check iron and ferritin  Other Stroke Risk Factors  Advanced age  Former cigarette smoker  ETOH use, advised to drink no more than 1 drink per day  Coronary artery disease  Other Active Problems  Hyponatremia - 132-132-129  Right hip fracture - s/p surgery  Hospital day # 3   Rosalin Hawking, MD PhD Stroke Neurology 04/02/2016 6:22 PM   To contact Stroke Continuity provider, please refer to [email or web address] After hours, contact General Neurology

## 2016-04-02 NOTE — Progress Notes (Signed)
Inpatient Rehabilitation  I met with the patient and his wife at the bedside to discuss the recommendation for IP Rehab.  Wife is familiar with CIR from pt;s recent admission, however pt. Could not recall having been to CIR in the past.  Wife indicates she would like pt. To return to CIR for his rehab recovery.  I will follow for medical stability and increased tolerance in therapies.  Please call if questions.  [place] Admissions Coordinator Cell [phone] Office [phone]

## 2016-04-02 NOTE — Consult Note (Signed)
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 right handed male with history of CAD status post stenting, PAF, hypertension, type 2 diabetes mellitus, right THA September 2017, left thalamic infarct April 2017 receiving inpatient rehabilitation services 06/21/2015 until 07/09/2015 and discharged to home with wife maintained on aspirin and Plavix for CVA prophylaxis. Per chart review and wife, patient was independent with wheelchair prior to admission. One level home with ramped entrance. Presented 03/30/2016 with aphasia. Cranial CT scan negative for acute changes. He did receive TPA. Noted elevated systolic blood pressure in the 260s given labetalol and nicardipine. MRI of the brain reviewed, 03/31/2016 negative, but limited due to motion, ?CVA. CT angiogram head and neck showed left temporal lobe hemorrhagic infarct. Parenchymal hematoma measuring 4 mL volume. No emergent large vessel occlusion. Atherosclerosis with 60-70% proximal right ICA 40-50% proximal left ICA stenosis. Neurology follow-up anticoagulation currently on hold with workup ongoing. Physical therapy evaluation completed 03/31/2016 with recommendations of physical medicine rehabilitation consult. Pt wife reports increasing confusion.   Review of Systems  Constitutional: Negative for chills and fever.  HENT: Positive for hearing loss. Negative for tinnitus.   Eyes: Negative for blurred vision and double vision.  Respiratory: Negative for cough and shortness of breath.   Cardiovascular: Negative for chest pain.  Gastrointestinal: Positive for constipation. Negative for nausea and vomiting.       GERD  Genitourinary: Positive for urgency. Negative for flank pain and hematuria.  Skin: Negative for rash.  Neurological: Positive for speech change, focal weakness and weakness. Negative for  sensory change and seizures.  Psychiatric/Behavioral: Positive for depression.  All other systems reviewed and are negative.  Past Medical History:  Diagnosis Date  . Arthritis    "pretty much all over"   . Basal cell carcinoma    "several burned off his body, face, head"  . BPH (benign prostatic hypertrophy)   . Coronary artery disease    a. s/p PCI of RCA in 2006  . CVA (cerebral infarction)    a. 06/2015: left thalamic and bilateral PCA  . GERD (gastroesophageal reflux disease)   . Hyperlipidemia   . Hypertension   . TIA (transient ischemic attack)    Approximately 6 weeks post-cardiac catheterization.   . Type II diabetes mellitus ([place])    "prediabetic; lost alot of weight; not diabetic now" (06/16/2015)   Past Surgical History:  Procedure Laterality Date  . CARDIOVASCULAR STRESS TEST  07/01/2007   EF 74%  . CATARACT EXTRACTION, BILATERAL    . CORONARY ANGIOPLASTY WITH STENT PLACEMENT  10/2004   stenting x 2 to RCA  . FEMUR IM NAIL Right 11/26/2015   Procedure: INTRAMEDULLARY RIGHT (IM) NAIL FEMORAL;  Surgeon: Rod Can, MD;  Location: [facility];  Service: Orthopedics;  Laterality: Right;  . HERNIA REPAIR    . HIP ARTHROPLASTY  03/09/2011   Procedure: ARTHROPLASTY BIPOLAR HIP;  Surgeon: Mauri Pole;  Location: [facility];  Service: Orthopedics;  Laterality: Left;  . LAPAROSCOPIC INCISIONAL / UMBILICAL / VENTRAL HERNIA REPAIR     "below his naval"   Family History  Problem Relation Age of Onset  . Hypertension Mother   . Lung cancer Father   . Lung cancer Brother    Social History:  reports that he quit smoking about 45 years ago. He has never used smokeless tobacco. He reports that he drinks  about 4.8 oz of alcohol per week . He reports that he does not use drugs. Allergies: No Known Allergies Medications Prior to Admission  Medication Sig Dispense Refill  . acetaminophen (TYLENOL) 500 MG tablet Take 1,000 mg by mouth every 6 (six) hours as needed for mild pain.    [name]  aspirin EC 81 MG tablet Take 81 mg by mouth daily.    [name] atorvastatin (LIPITOR) 80 MG tablet Take 1 tablet (80 mg total) by mouth daily. 30 tablet 0  . clopidogrel (PLAVIX) 75 MG tablet Take 1 tablet by mouth daily.    . metoprolol tartrate (LOPRESSOR) 25 MG tablet Take 1 tablet (25 mg total) by mouth 2 (two) times daily. (Patient taking differently: Take 12.5 mg by mouth 2 (two) times daily. ) 60 tablet 0  . Multiple Vitamin (MULTIVITAMIN) tablet Take 1 tablet by mouth daily.     [name] MYRBETRIQ 50 MG TB24 tablet Take 1 tablet by mouth daily.    . nitroGLYCERIN (NITROSTAT) 0.4 MG SL tablet Place 0.4 mg under the tongue every 5 (five) minutes as needed for chest pain.    . tamsulosin (FLOMAX) 0.4 MG CAPS capsule Take 0.4 mg by mouth every other day.     . citalopram (CELEXA) 10 MG tablet Take 1 tablet (10 mg total) by mouth daily. 30 tablet 0    Home: Home Living Family/patient expects to be discharged to:: Private residence Living Arrangements: Spouse/significant other Available Help at Discharge: Family, Available 24 hours/day Type of Home: House Home Access: Ramped entrance Home Layout: One level Bathroom Shower/Tub: Walk-in shower (grab bar in the shower) Bathroom Toilet: Handicapped height Bathroom Accessibility: Yes Home Equipment: Environmental consultant - 2 wheels, [place] - single point, Wheelchair - manual, Transport chair Additional Comments: Pt at [facility] 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 after [facility]VA  Lives With: Spouse  Functional History: Prior Function Level of Independence: Independent with assistive device(s) Comments: ambulates with RW short distances, uses w/c  Functional Status:  Mobility: Bed Mobility Overal bed mobility: Needs Assistance Bed Mobility: Rolling, Sidelying to Sit, Sit to Supine Rolling: Mod assist Sidelying to sit: Max assist Sit to supine: Max assist General bed mobility comments: Max assist for bed mobility elevation to upright and return to supine using modified helicopter  technique Transfers Overall transfer level: Needs assistance Equipment used: 2 person hand held assist Transfers: Sit to/from Stand Sit to Stand: +2 physical assistance, Mod assist General transfer comment: Moderate assist of 2 to power up to standing, patient self limiting weight on RLE during elevation to upright. Max assist to maintain standing      ADL:    Cognition: Cognition Overall Cognitive Status: Difficult to assess Arousal/Alertness: Awake/alert Orientation Level: Oriented to person, Oriented to place, Oriented to situation, Disoriented to time Memory: Impaired Memory Impairment: Retrieval deficit Behaviors: Perseveration Cognition Arousal/Alertness: Awake/alert Behavior During Therapy: Flat affect Overall Cognitive Status: Difficult to assess Difficult to assess due to: Hard of hearing/deaf, Impaired communication  Blood pressure 120/62, pulse 74, temperature 98.8 F (37.1 C), temperature source Oral, resp. rate 18, height 5\' 7"  (1.702 m), weight 74.5 kg (164 lb 3.9 oz), SpO2 95 %. Physical Exam  Vitals reviewed. Constitutional: He appears well-developed and well-nourished.  HENT:  Head: Normocephalic and atraumatic.  Eyes: EOM are normal.  Pupils round and reactive to light  Neck: Normal range of motion. Neck supple. No thyromegaly present.  Cardiovascular:  Irregularly irregular  Respiratory: Effort normal and breath sounds normal. No respiratory distress.  GI: Soft. Bowel sounds  are normal. He exhibits no distension.  Musculoskeletal: He exhibits tenderness. He exhibits no edema.  Limited ROM right knee  Neurological: He is alert.  Makes good eye contact with examiner.  A&Ox2 with increased time, cues, and self-correction Follows simple commands. Motor: RUE: 4-4+/5 proximal to distal LUE: 4+/5 proximal to distal RLE: HF, KE 2/5, ADF/PF 3/5 LLE: 4+-5/5 proxima to distal Sensation intact to light touch DTRs symmetric HOH  Skin: Skin is warm and dry.   Psychiatric: He has a normal mood and affect. His behavior is normal.    Results for orders placed or performed during the hospital encounter of 03/30/16 (from the past 24 hour(s))  TSH     Status: None   Collection Time: 04/01/16  7:24 AM  Result Value Ref Range   TSH 2.423 0.350 - 4.500 uIU/mL  Basic metabolic panel     Status: Abnormal   Collection Time: 04/01/16  7:30 AM  Result Value Ref Range   Sodium 132 (L) 135 - 145 mmol/L   Potassium 3.7 3.5 - 5.1 mmol/L   Chloride 106 101 - 111 mmol/L   CO2 21 (L) 22 - 32 mmol/L   Glucose, Bld 94 65 - 99 mg/dL   BUN 7 6 - 20 mg/dL   Creatinine, Ser 0.83 0.61 - 1.24 mg/dL   Calcium 8.3 (L) 8.9 - 10.3 mg/dL   GFR calc non Af Amer >60 >60 mL/min   GFR calc Af Amer >60 >60 mL/min   Anion gap 5 5 - 15  CBC     Status: Abnormal   Collection Time: 04/01/16  7:30 AM  Result Value Ref Range   WBC 10.7 (H) 4.0 - 10.5 K/uL   RBC 3.46 (L) 4.22 - 5.81 MIL/uL   Hemoglobin 10.6 (L) 13.0 - 17.0 g/dL   HCT 31.6 (L) 39.0 - 52.0 %   MCV 91.3 78.0 - 100.0 fL   MCH 30.6 26.0 - 34.0 pg   MCHC 33.5 30.0 - 36.0 g/dL   RDW 15.0 11.5 - 15.5 %   Platelets 299 150 - 400 K/uL  Vitamin B12     Status: Abnormal   Collection Time: 04/01/16  7:30 AM  Result Value Ref Range   Vitamin B-12 1,477 (H) 180 - 914 pg/mL  Glucose, capillary     Status: Abnormal   Collection Time: 04/01/16  7:53 AM  Result Value Ref Range   Glucose-Capillary 101 (H) 65 - 99 mg/dL   Comment 1 Notify RN    Comment 2 Document in Chart   Glucose, capillary     Status: Abnormal   Collection Time: 04/01/16 12:11 PM  Result Value Ref Range   Glucose-Capillary 102 (H) 65 - 99 mg/dL   Comment 1 Notify RN    Comment 2 Document in Chart   Glucose, capillary     Status: Abnormal   Collection Time: 04/01/16  4:48 PM  Result Value Ref Range   Glucose-Capillary 105 (H) 65 - 99 mg/dL  Glucose, capillary     Status: Abnormal   Collection Time: 04/01/16  9:24 PM  Result Value Ref Range    Glucose-Capillary 124 (H) 65 - 99 mg/dL   Comment 1 Notify RN    Comment 2 Document in Chart   Basic metabolic panel     Status: Abnormal   Collection Time: 04/02/16  2:12 AM  Result Value Ref Range   Sodium 129 (L) 135 - 145 mmol/L   Potassium 3.4 (L) 3.5 - 5.1  mmol/L   Chloride 102 101 - 111 mmol/L   CO2 22 22 - 32 mmol/L   Glucose, Bld 107 (H) 65 - 99 mg/dL   BUN 9 6 - 20 mg/dL   Creatinine, Ser 0.93 0.61 - 1.24 mg/dL   Calcium 8.3 (L) 8.9 - 10.3 mg/dL   GFR calc non Af Amer >60 >60 mL/min   GFR calc Af Amer >60 >60 mL/min   Anion gap 5 5 - 15  CBC     Status: Abnormal   Collection Time: 04/02/16  2:12 AM  Result Value Ref Range   WBC 10.3 4.0 - 10.5 K/uL   RBC 3.44 (L) 4.22 - 5.81 MIL/uL   Hemoglobin 10.3 (L) 13.0 - 17.0 g/dL   HCT 31.5 (L) 39.0 - 52.0 %   MCV 91.6 78.0 - 100.0 fL   MCH 29.9 26.0 - 34.0 pg   MCHC 32.7 30.0 - 36.0 g/dL   RDW 14.8 11.5 - 15.5 %   Platelets 315 150 - 400 K/uL   Ct Angio Head W Or Wo Contrast  Result Date: 03/31/2016 CLINICAL DATA:  Stroke. History of aphasia requiring TPA. Right-sided weakness. EXAM: CT ANGIOGRAPHY HEAD AND NECK TECHNIQUE: Multidetector CT imaging of the head and neck was performed using the standard protocol during bolus administration of intravenous contrast. Multiplanar CT image reconstructions and MIPs were obtained to evaluate the vascular anatomy. Carotid stenosis measurements (when applicable) are obtained utilizing NASCET criteria, using the distal internal carotid diameter as the denominator. CONTRAST:  50 cc Isovue 370 intravenous COMPARISON:  Brain MRI from earlier today FINDINGS: CT HEAD FINDINGS Brain: Acute hemorrhage in the inferior left temporal lobe measuring 16 x 25 x 18 mm (~4cc). The neighboring brain is low-density and edematous appearing. Hemorrhage tracks along the middle cranial fossa, subdural in this location. No edema seen in the insula or medial temporal lobe. Patient had acute onset stroke symptoms in  this is presumably hemorrhagic conversion of and MCA infarct after tPA. No history of trauma. Remote lacunar infarct in the left thalamus. Small-vessel ischemic gliosis in the periventricular white matter. Vascular: See below.  Atherosclerotic calcification. Skull: No acute finding Sinuses: No acute finding Orbits: Bilateral cataract resection Review of the MIP images confirms the above findings CTA NECK FINDINGS Aortic arch: Diffuse atherosclerosis.  Three vessel branching Right carotid system: Moderate to advanced volume of calcified plaque the common carotid bifurcation in the proximal ICA with of to 70% stenosis. No dissection or ulceration noted. Left carotid system: Moderate predominately calcified plaque at the common carotid bifurcation with up to 45% proximal ICA stenosis. Negative for dissection or ulceration. Vertebral arteries: Subclavian atherosclerosis without flow limiting stenosis. Left dominant vertebral artery. Both vertebral arteries are widely patent to the dura. Skeleton: Remote right clavicle fracture. Disc and facet degeneration. 6 mm of anterolisthesis at C7-T1. Other neck: No noted adenopathy or mass.  Atrophic thyroid. Upper chest: Subpleural pulmonary fibrosis. Review of the MIP images confirms the above findings CTA HEAD FINDINGS Anterior circulation: Atherosclerotic plaque on the bilateral carotid siphons. The right ICA is larger than the left in the setting of aplastic left A1 segment. No major vessel occlusion. Mild to moderate right paraclinoid ICA narrowing. No suspected flow limiting stenosis. Negative for aneurysm. Posterior circulation: Strong left vertebral artery dominance. No major branch occlusion or flow limiting stenosis. Mild atheromatous changes on the left V4 segment. Negative for aneurysm Venous sinuses: Patent Anatomic variants: Aplastic left A1 segment. Delayed phase: No parenchymal enhancement or mass.  These results were called by telephone at the time of  interpretation on 03/31/2016 at 4:27 pm to Dr. Rosalin Hawking , who verbally acknowledged these results. Review of the MIP images confirms the above findings IMPRESSION: 1. Left temporal lobe hemorrhagic infarct. The parenchymal hematoma measures 4 cc volume. Subdural extension in the middle cranial fossa without significant mass effect. 2. No emergent large vessel occlusion. 3. Atherosclerosis with 60-70% proximal right ICA and 40-50% proximal left ICA stenosis. Mild moderate right supraclinoid ICA stenosis. No significant stenosis in the posterior circulation. 4. Pulmonary fibrosis. Electronically Signed   By: Monte Fantasia M.D.   On: 03/31/2016 16:30   Ct Angio Neck W Or Wo Contrast  Result Date: 03/31/2016 CLINICAL DATA:  Stroke. History of aphasia requiring TPA. Right-sided weakness. EXAM: CT ANGIOGRAPHY HEAD AND NECK TECHNIQUE: Multidetector CT imaging of the head and neck was performed using the standard protocol during bolus administration of intravenous contrast. Multiplanar CT image reconstructions and MIPs were obtained to evaluate the vascular anatomy. Carotid stenosis measurements (when applicable) are obtained utilizing NASCET criteria, using the distal internal carotid diameter as the denominator. CONTRAST:  50 cc Isovue 370 intravenous COMPARISON:  Brain MRI from earlier today FINDINGS: CT HEAD FINDINGS Brain: Acute hemorrhage in the inferior left temporal lobe measuring 16 x 25 x 18 mm (~4cc). The neighboring brain is low-density and edematous appearing. Hemorrhage tracks along the middle cranial fossa, subdural in this location. No edema seen in the insula or medial temporal lobe. Patient had acute onset stroke symptoms in this is presumably hemorrhagic conversion of and MCA infarct after tPA. No history of trauma. Remote lacunar infarct in the left thalamus. Small-vessel ischemic gliosis in the periventricular white matter. Vascular: See below.  Atherosclerotic calcification. Skull: No acute  finding Sinuses: No acute finding Orbits: Bilateral cataract resection Review of the MIP images confirms the above findings CTA NECK FINDINGS Aortic arch: Diffuse atherosclerosis.  Three vessel branching Right carotid system: Moderate to advanced volume of calcified plaque the common carotid bifurcation in the proximal ICA with of to 70% stenosis. No dissection or ulceration noted. Left carotid system: Moderate predominately calcified plaque at the common carotid bifurcation with up to 45% proximal ICA stenosis. Negative for dissection or ulceration. Vertebral arteries: Subclavian atherosclerosis without flow limiting stenosis. Left dominant vertebral artery. Both vertebral arteries are widely patent to the dura. Skeleton: Remote right clavicle fracture. Disc and facet degeneration. 6 mm of anterolisthesis at C7-T1. Other neck: No noted adenopathy or mass.  Atrophic thyroid. Upper chest: Subpleural pulmonary fibrosis. Review of the MIP images confirms the above findings CTA HEAD FINDINGS Anterior circulation: Atherosclerotic plaque on the bilateral carotid siphons. The right ICA is larger than the left in the setting of aplastic left A1 segment. No major vessel occlusion. Mild to moderate right paraclinoid ICA narrowing. No suspected flow limiting stenosis. Negative for aneurysm. Posterior circulation: Strong left vertebral artery dominance. No major branch occlusion or flow limiting stenosis. Mild atheromatous changes on the left V4 segment. Negative for aneurysm Venous sinuses: Patent Anatomic variants: Aplastic left A1 segment. Delayed phase: No parenchymal enhancement or mass. These results were called by telephone at the time of interpretation on 03/31/2016 at 4:27 pm to Dr. Rosalin Hawking , who verbally acknowledged these results. Review of the MIP images confirms the above findings IMPRESSION: 1. Left temporal lobe hemorrhagic infarct. The parenchymal hematoma measures 4 cc volume. Subdural extension in the middle  cranial fossa without significant mass effect. 2. No emergent large vessel  occlusion. 3. Atherosclerosis with 60-70% proximal right ICA and 40-50% proximal left ICA stenosis. Mild moderate right supraclinoid ICA stenosis. No significant stenosis in the posterior circulation. 4. Pulmonary fibrosis. Electronically Signed   By: Monte Fantasia M.D.   On: 03/31/2016 16:30   Mr Brain Wo Contrast  Result Date: 03/31/2016 CLINICAL DATA:  Acute ischemic stroke. EXAM: MRI HEAD WITHOUT CONTRAST TECHNIQUE: Multiplanar, multiecho pulse sequences of the brain and surrounding structures were obtained without intravenous contrast. COMPARISON:  CT head 03/30/2016 FINDINGS: Limited study. Marked motion degrades image quality. The patient was not able to complete the study due to confusion. Negative for acute infarct. Generalized atrophy. Negative for hydrocephalus. No mass lesion. IMPRESSION: Limited examination.  No acute abnormality. Electronically Signed   By: Franchot Gallo M.D.   On: 03/31/2016 13:15    Assessment/Plan: Diagnosis: Left temporal lobe infarct with hemorrhagic transformation with history of left CVA. Labs and images independently reviewed.  Records reviewed and summated above. Stroke: Continue secondary stroke prophylaxis and Risk Factor Modification listed below:   Blood Pressure Management:  Continue current medication with prn's with permisive HTN per primary team Statin Agent:   Diabetes management:   Right sided hemiparesis: fit for orthosis to prevent contractures Motor recovery: Fluoxetine  1. Does the need for close, 24 hr/day medical supervision in concert with the patient's rehab needs make it unreasonable for this patient to be served in a less intensive setting? Yes  2. Co-Morbidities requiring supervision/potential complications: CAD status post stenting (cont meds), PAF (monitor HR with increased physical activity), HTN, currently labile with hypotension (monitor and provide prns in  accordance with increased physical exertion and pain), type 2 diabetes mellitus (Monitor in accordance with exercise and adjust meds as necessary), right THA September 2017, left thalamic infarct with residual deficits, hyponatremia (cont to monitor, treat if necessary), hypokalemia (continue to monitor and replete as necessary), ABLA (transfuse if necessary to ensure appropriate perfusion for increased activity tolerance) 3. Due to safety, disease management, medication administration and patient education, does the patient require 24 hr/day rehab nursing? Yes 4. Does the patient require coordinated care of a physician, rehab nurse, PT (1-2 hrs/day, 5 days/week), OT (1-2 hrs/day, 5 days/week) and SLP (1-2 hrs/day, 5 days/week) to address physical and functional deficits in the context of the above medical diagnosis(es)? Yes Addressing deficits in the following areas: balance, endurance, locomotion, strength, transferring, bathing, dressing, toileting, cognition and psychosocial support 5. Can the patient actively participate in an intensive therapy program of at least 3 hrs of therapy per day at least 5 days per week? Potentially 6. The potential for patient to make measurable gains while on inpatient rehab is excellent 7. Anticipated functional outcomes upon discharge from inpatient rehab are supervision and min assist  with PT, supervision and min assist with OT, modified independent and supervision with SLP. 8. Estimated rehab length of stay to reach the above functional goals is: 15-19 days. 9. Does the patient have adequate social supports and living environment to accommodate these discharge functional goals? Yes 10. Anticipated D/C setting: Home 11. Anticipated post D/C treatments: HH therapy and Home excercise program 12. Overall Rehab/Functional Prognosis: good  RECOMMENDATIONS: This patient's condition is appropriate for continued rehabilitative care in the following setting: CIR after  competion of medical workup and medically stable for CIR. Patient has agreed to participate in recommended program. Potentially Note that insurance prior authorization may be required for reimbursement for recommended care.  Comment: Rehab Admissions Coordinator to follow up.  Delice Lesch, MD, Mellody Drown Cathlyn Parsons., PA-C 04/02/2016

## 2016-04-02 NOTE — Evaluation (Signed)
Occupational Therapy Evaluation Patient Details Name: Herbert Marquez MRN: [id number] DOB: 07-Jun-1927 Today's Date: 04/02/2016    History of Present Illness 83 y.o.malewith history of DM, previous stroke, HTN, HLD, afib 11/28/2015 post right hip fracture surgery, and CAD s/p PCI,presenting with speechdifficulties and aphasia. HereceivedIV tPA Friday, 03/30/2016 at 1330. MRI + for acute stroke involving the L MCAterritory.   Clinical Impression   PTA, pt was independent with assistive devices for ADL. He was using RW for short-distance functional mobility as well as w/c. Pt currently requires max assist with LB ADL, max assist +2 for simulated toilet transfers with Stedy, and min assist for UB ADL. Pt motivated to participate with therapy but limited by pain and fatigue this session. Pt also presents with grossly decreased R UE strength as compared to L on OT evaluation. Please see below for further details. Feel pt would benefit greatly from continued rehabilitation through CIR to maximize return to PLOF. OT will continue to follow acutely with focus on activity tolerance for ADL and ADL transfers.     Follow Up Recommendations  CIR    Equipment Recommendations  Other (comment) (TBD at next venue of care)    Recommendations for Other Services Rehab consult     Precautions / Restrictions Precautions Precautions: Fall Restrictions Weight Bearing Restrictions: No      Mobility Bed Mobility Overal bed mobility: Needs Assistance Bed Mobility: Rolling;Sidelying to Sit Rolling: Mod assist Sidelying to sit: Max assist Supine to sit: Max assist;+2 for physical assistance Sit to supine: +2 for physical assistance;Mod assist;Max assist   General bed mobility comments: Mod assist +2 for bed mobility. Assist from PTS for elevation to upright and with OT assist to swing off bed with v/c for using elbow to assist. Pt very limited due to fatigue but able to help some. Max A +2 to shift pt  up in bed at end of tx.  Transfers Overall transfer level: Needs assistance Equipment used:  Charlaine Dalton) Transfers: Sit to/from Stand Sit to Stand: +2 physical assistance;Max assist;Mod assist         General transfer comment: Max assist of 2 to power up from bed to standing due to pt c/o fatigue. Pt limited to put much weight on R LE .  Mod A+2 from elevated surface off stedy to standing x2.  Patient quickly fatigued by end of treatment, requiring max A +2 for third sit to stand trial off elevated surface.     Balance Overall balance assessment: Needs assistance Sitting-balance support: Bilateral upper extremity supported Sitting balance-Leahy Scale: Fair Sitting balance - Comments: able to sit without support at times during functional tasks but requires bilateral UE support once fatigue sets in.  Cuing for upright posture   Standing balance support: Bilateral upper extremity supported;During functional activity Standing balance-Leahy Scale: Zero Standing balance comment: Pt able to perform static standing in [place] with max assist +2 and bilateral UE support and for safety.                             ADL Overall ADL's : Needs assistance/impaired Eating/Feeding: Set up;Sitting   Grooming: Oral care;Sitting;Minimal assistance;Wash/dry hands Grooming Details (indicate cue type and reason): Sitting on Stedy; Pt fatigued and unable to tolerate standing for oral care, but did stand briefly to wash his hands requiring single UE support. Upper Body Bathing: Minimal assistance;Sitting   Lower Body Bathing: Sit to/from stand;Maximal assistance   Upper Body Dressing :  Minimal assistance;Sitting   Lower Body Dressing: Sit to/from stand;Maximal assistance   Toilet Transfer: Maximal assistance;+2 for safety/equipment;+2 for physical assistance;BSC Toilet Transfer Details (indicate cue type and reason): With [facility] for simulated transfer. Only able to tolerate standing for  approximately 45-60 seconds with max assist +2. Toileting- Clothing Manipulation and Hygiene: Maximal assistance;Sit to/from stand         General ADL Comments: Pt with decreased activity tolerance for ADL on evaluation. Unable to stand for longer than 1 minute this session for tasks at sink.     Vision Vision Assessment?: No apparent visual deficits Additional Comments: Will continue to assess in functional context   Perception     Praxis      Pertinent Vitals/Pain Pain Assessment: Faces Faces Pain Scale: Hurts even more Pain Location: R knee Pain Descriptors / Indicators: Grimacing;Guarding Pain Intervention(s): Monitored during session;Premedicated before session     Hand Dominance Right   Extremity/Trunk Assessment Upper Extremity Assessment Upper Extremity Assessment: RUE deficits/detail RUE Deficits / Details: Grossly decreased strength as compared to L (4/5 grossly). Able to use R hand functionally to brush teeth. RUE Coordination: decreased fine motor   Lower Extremity Assessment Lower Extremity Assessment: Defer to PT evaluation       Communication Communication Communication: HOH   Cognition Arousal/Alertness: Awake/alert Behavior During Therapy: Flat affect Overall Cognitive Status: Within Functional Limits for tasks assessed                 General Comments: Pt follows commands well and with good awareness of safety and deficits. Per wife's report, pt is at baseline cognitively.   General Comments       Exercises       Shoulder Instructions      Home Living Family/patient expects to be discharged to:: Private residence Living Arrangements: Spouse/significant other Available Help at Discharge: Family;Available 24 hours/day Type of Home: House Home Access: Ramped entrance     Home Layout: One level     Bathroom Shower/Tub: Walk-in shower (grab bar in shower; was using shower seat)   Bathroom Toilet: Handicapped height Bathroom  Accessibility: Yes How Accessible: Accessible via walker Home Equipment: Union - 2 wheels;Cane - single point;Wheelchair - Banker;Shower seat;Grab bars - tub/shower   Additional Comments: Pt at CIR 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 after CVA  Lives With: Spouse    Prior Functioning/Environment Level of Independence: Independent with assistive device(s)        Comments: ambulates with RW short distances, uses w/c         OT Problem List: Decreased strength;Decreased range of motion;Decreased activity tolerance;Impaired balance (sitting and/or standing);Decreased safety awareness;Decreased knowledge of use of DME or AE;Decreased knowledge of precautions;Pain   OT Treatment/Interventions: Self-care/ADL training;Therapeutic exercise;Energy conservation;Therapeutic activities;Patient/family education;Balance training;Cognitive remediation/compensation;Visual/perceptual remediation/compensation    OT Goals(Current goals can be found in the care plan section) Acute Rehab OT Goals Patient Stated Goal: to take a nap OT Goal Formulation: With patient Time For Goal Achievement: 04/16/16 Potential to Achieve Goals: Good ADL Goals Pt Will Perform Grooming: with supervision;standing Pt Will Perform Upper Body Bathing: with supervision;sitting Pt Will Perform Lower Body Bathing: with supervision;sit to/from stand Pt Will Transfer to Toilet: with supervision;bedside commode;stand pivot transfer Pt Will Perform Toileting - Clothing Manipulation and hygiene: with supervision;sit to/from stand Additional ADL Goal #1: Pt will demonstrate improved activity tolerance for ADL by standing at sink for 10 minutes for grooming tasks with no more than 1 therapeutic rest break. Additional ADL Goal #2: Pt will  complete bed mobility with modified independence in preparation for ADL participation.  OT Frequency: Min 3X/week   Barriers to D/C:            Co-evaluation PT/OT/SLP Co-Evaluation/Treatment:  Yes Reason for Co-Treatment: For patient/therapist safety;To address functional/ADL transfers   OT goals addressed during session: ADL's and self-care      End of Session Equipment Utilized During Treatment: Gait belt Charlaine Dalton)  Activity Tolerance: Patient tolerated treatment well Patient left: in bed;with call bell/phone within reach;with family/visitor present   Time: 1413-1440 OT Time Calculation (min): 27 min Charges:  OT General Charges $OT Visit: 1 Procedure OT Evaluation $OT Eval Moderate Complexity: 1 Procedure G-CodesNorman Herrlich, OTR/L [phone] 04/02/2016, 4:30 PM

## 2016-04-02 NOTE — Progress Notes (Signed)
Speech Language Pathology Treatment: Cognitive-Linquistic  Patient Details Name: Herbert Marquez MRN: [id number] DOB: [birth year] Today's Date: 04/02/2016 Time: [id number] SLP Time Calculation (min) (ACUTE ONLY): 19 min  Assessment / Plan / Recommendation Clinical Impression  Pt with significant gains with minimal aphasic/apraxic components noted within phrases-sentences; pt with 80% accuracy with confrontational naming, phonemic paraphasias, semantic paraphasias and neologisms not observed during this session during conversation; groping appears to be resolved from initial assessment, minimal word finding observed within complex conversation only with minimal verbal cues; pt oriented x4, but wife stated he is "still confused at times."  Deficits noted within organizational skills, language with repetition tasks (longer sentences), and sustained attention tasks.  ST still indicated; will continue to f/u while in house.    HPI HPI: 73-yo RH man who presents to the ED for the evaluation of speech changes. History is obtained from the patient's wife and daughter who are present at the bedside. The patient has expressive aphasia and is unable to provide any information.       SLP Plan  Continue with current plan of care     Recommendations   TBD                General recommendations: Rehab consult Follow up Recommendations: Inpatient Rehab Plan: Continue with current plan of care                       Gretta Samons,PAT, M.S., CCC-SLP 04/02/2016, 4:45 PM

## 2016-04-03 LAB — BASIC METABOLIC PANEL
Anion gap: 9 (ref 5–15)
BUN: 10 mg/dL (ref 6–20)
CO2: 22 mmol/L (ref 22–32)
Calcium: 8.4 mg/dL — ABNORMAL LOW (ref 8.9–10.3)
Chloride: 98 mmol/L — ABNORMAL LOW (ref 101–111)
Creatinine, Ser: 0.8 mg/dL (ref 0.61–1.24)
GFR calc Af Amer: >60 mL/min (ref >60)
GFR calc non Af Amer: >60 mL/min (ref >60)
Glucose, Bld: 107 mg/dL — ABNORMAL HIGH (ref 65–99)
Potassium: 3.6 mmol/L (ref 3.5–5.1)
Sodium: 129 mmol/L — ABNORMAL LOW (ref 135–145)

## 2016-04-03 LAB — IRON AND TIBC
Iron: 12 ug/dL — ABNORMAL LOW (ref 45–182)
Saturation Ratios: 6 % — ABNORMAL LOW (ref 17.9–39.5)
TIBC: 209 ug/dL — ABNORMAL LOW (ref 250–450)
UIBC: 197 ug/dL

## 2016-04-03 LAB — CBC
HCT: 32.3 % — ABNORMAL LOW (ref 39.0–52.0)
Hemoglobin: 10.8 g/dL — ABNORMAL LOW (ref 13.0–17.0)
MCH: 30.5 pg (ref 26.0–34.0)
MCHC: 33.4 g/dL (ref 30.0–36.0)
MCV: 91.2 fL (ref 78.0–100.0)
Platelets: 305 10*3/uL (ref 150–400)
RBC: 3.54 MIL/uL — ABNORMAL LOW (ref 4.22–5.81)
RDW: 14.7 % (ref 11.5–15.5)
WBC: 10.1 10*3/uL (ref 4.0–10.5)

## 2016-04-03 LAB — FERRITIN: Ferritin: 302 ng/mL (ref 24–336)

## 2016-04-03 MED ORDER — METOPROLOL TARTRATE 12.5 MG HALF TABLET
12.5000 mg | ORAL_TABLET | Freq: Two times a day (BID) | ORAL | Status: DC
  Administered 2016-04-03 – 2016-04-04 (×3): 12.5 mg via ORAL
  Filled 2016-04-03 (×3): qty 1

## 2016-04-03 NOTE — Progress Notes (Signed)
PT Cancellation Note  Patient Details Name: Herbert Marquez MRN: [id number] DOB: 01-18-1928   Cancelled Treatment:    Reason Eval/Treat Not Completed: Medical issues which prohibited therapy (Pt has converted to A-fib, tachycaric in 130's bpm at rest and low BP. Nurse deferred tx at this time. Will check back in pm if medically stable. )   Araceli Bouche 04/03/2016, 10:04 AM [place], [place] Pager [phone]

## 2016-04-03 NOTE — Progress Notes (Signed)
Physical Therapy Treatment Patient Details Name: Herbert Marquez MRN: [id number] DOB: 04-14-1927 Today's Date: 04/03/2016    History of Present Illness 83 y.o.malewith history of DM, previous stroke, HTN, HLD, afib 11/28/2015 post right hip fracture surgery, and CAD s/p PCI,presenting with speechdifficulties and aphasia. HereceivedIV tPA Friday, 03/30/2016 at 1330. MRI + for acute stroke involving the L MCAterritory.    PT Comments    Upon arrival pt seemed to be laying in urine due to incontinence; wife not in room at the time. Pt able to perform transfers with less assistance compared to yesterday. He presented with labored breathing throughout treatment but no c/o of chest pain or SOB; HR was within 70-80's bpm. Pt will continue to benefit from seated and standing balance along with LE strengthening while monitoring HR to prepare for CIR upon d/c.   Follow Up Recommendations  CIR;Supervision/Assistance - 24 hour     Equipment Recommendations  None recommended by PT    Recommendations for Other Services       Precautions / Restrictions Precautions Precautions: Fall Restrictions Weight Bearing Restrictions: No    Mobility  Bed Mobility                  Transfers Overall transfer level: Needs assistance Equipment used:  (stedy) Transfers: Sit to/from Stand Sit to Stand: Mod assist;+2 physical assistance         General transfer comment: mod A +2 to power up from EOB into standing.  Min assist from elevated surface to standing for perianal care with B UE assist on stedy.  Ambulation/Gait             General Gait Details: unable to perform today.   Stairs            Wheelchair Mobility    Modified Rankin (Stroke Patients Only)       Balance Overall balance assessment: Needs assistance   Sitting balance-Leahy Scale: Good Sitting balance - Comments: able to sit without support and perform minimal self care but decreases to fair with  LOB when fatigued or challenged. Unable to self correct without mod A from SPTA to regain pt balance.     Standing balance-Leahy Scale: Poor Standing balance comment: Pt able to stand in stedy with B UE support for 1 minute before fatigue.                    Cognition Arousal/Alertness: Awake/alert Behavior During Therapy: WFL for tasks assessed/performed Overall Cognitive Status: Within Functional Limits for tasks assessed                      Exercises Total Joint Exercises Ankle Circles/Pumps: Seated;Both;10 reps;AROM Long Arc Quad: AROM;Both;Seated;PROM;15 reps (AROM limited ; SPTA performed 5 of 15 reps passively) General Exercises - Lower Extremity Hip Flexion/Marching: Seated;10 reps;Both;AROM Heel Raises: AROM;Seated;Both;10 reps    General Comments        Pertinent Vitals/Pain Pain Assessment: No/denies pain    Home Living                      Prior Function            PT Goals (current goals can now be found in the care plan section) Acute Rehab PT Goals Patient Stated Goal: none stated Potential to Achieve Goals: Good Progress towards PT goals: Progressing toward goals    Frequency    Min 3X/week      PT Plan  Current plan remains appropriate    Co-evaluation             End of Session   Activity Tolerance: Patient limited by fatigue;Patient tolerated treatment well Patient left: with call bell/phone within reach;with family/visitor present;in chair     Time: [id number] PT Time Calculation (min) (ACUTE ONLY): 28 min  Charges:  $Therapeutic Activity: 23-37 mins                    G Codes:      [facility] Apr 19, 2016, 4:41 PM [place], [place] Pager [phone]

## 2016-04-03 NOTE — Progress Notes (Addendum)
Called by CCMD regarding change in cardiac rhythm and tachycardia. Patient alert and oriented x3, no complaints. Pulse 120-130s, trends down to 110s then goes back up. BP 109/69. Strip looks like a-fib which patient has a Hx of. Burnetta Sabin, NP paged and notified.  ~1011 Patient had a missed beat and then converted back to NSR. Pulse currently 80s. Burnetta Sabin, NP notified.

## 2016-04-03 NOTE — Clinical Social Work Note (Signed)
Clinical Social Work Assessment  Patient Details  Name: Herbert Marquez MRN: [id number] Date of Birth: 1927-03-10  Date of referral:  04/03/16               Reason for consult:  Facility Placement                Permission sought to share information with:  Facility Sport and exercise psychologist, Family Supports Permission granted to share information::  Yes, Verbal Permission Granted  Name::     Midwife::  SNFs  Relationship::  Spouse  Contact Information:  [phone]  Housing/Transportation Living arrangements for the past 2 months:  Single Family Home Source of Information:  Spouse Patient Interpreter Needed:  None Criminal Activity/Legal Involvement Pertinent to Current Situation/Hospitalization:  No - Comment as needed Significant Relationships:  Spouse Lives with:  Spouse Do you feel safe going back to the place where you live?  No Need for family participation in patient care:  Yes (Comment)  Care giving concerns:  CSW received consult for possible SNF placement at time of discharge if CIR is unable to accept patient. CSW spoke with patient and his wife regarding PT recommendation of SNF placement at time of discharge. Patient reported that patient's spouse is currently unable to care for patient at their home given patient's current physical needs and fall risk. Patient expressed understanding of PT recommendation and is agreeable to SNF placement at time of discharge if CIR is unable. CSW to continue to follow and assist with discharge planning needs.   Social Worker assessment / plan:  CSW spoke with patient and his wife concerning possibility of rehab at [facility] before returning home.  Employment status:  Retired Forensic scientist:  Medicare PT Recommendations:  Trainer / Referral to community resources:  [place]  Patient/Family's Response to care:  Patient recognizes need for rehab before returning home and is agreeable to  a SNF in [place]. Patient reported preference for [facility], though they prefer CIR.  Patient/Family's Understanding of and Emotional Response to Diagnosis, Current Treatment, and Prognosis:  Patient/family is realistic regarding therapy needs and expressed being hopeful for SNF placement. Patient expressed understanding of CSW role and discharge process. No questions/concerns about plan or treatment.    Emotional Assessment Appearance:  Appears stated age Attitude/Demeanor/Rapport:  Other (Appropriate) Affect (typically observed):  Accepting, Appropriate Orientation:  Oriented to Self, Oriented to Place, Oriented to Situation Alcohol / Substance use:  Not Applicable Psych involvement (Current and /or in the community):  No (Comment)  Discharge Needs  Concerns to be addressed:  Care Coordination Readmission within the last 30 days:  No Current discharge risk:  None Barriers to Discharge:  Continued Medical Work up   [employer], [place] 04/03/2016, 1:55 PM

## 2016-04-03 NOTE — Progress Notes (Signed)
The patient's wife is refusing CBGs, therefore cannot administer any insulin this morning. Yesterday CBGs were WNL and did not require insulin administration. Will continue to monitor.

## 2016-04-03 NOTE — Care Management Important Message (Signed)
Important Message  Patient Details  Name: TEREK DEBAR MRN: [id number] Date of Birth: 1928/01/25   Medicare Important Message Given:  Yes    Sama Arauz 04/03/2016, 12:00 PM

## 2016-04-03 NOTE — Progress Notes (Signed)
Wife refused hs CBG.

## 2016-04-03 NOTE — Care Management Note (Signed)
Case Management Note  Patient Details  Name: Herbert Marquez MRN: [id number] Date of Birth: 1927-12-27  Subjective/Objective:                    Action/Plan: Pt with rhythm change this am. Plan is for CIR when pt is medically ready. CM following.  Expected Discharge Date:                  Expected Discharge Plan:  [place]  In-House Referral:     Discharge planning Services  CM Consult  Post Acute Care Choice:    Choice offered to:     DME Arranged:    DME Agency:     HH Arranged:    [place] Agency:     Status of Service:  In process, will continue to follow  If discussed at Long Length of Stay Meetings, dates discussed:    Additional Comments:  Pollie Friar, RN 04/03/2016, 3:11 PM

## 2016-04-03 NOTE — Consult Note (Signed)
   [facility] CM Inpatient Consult   04/03/2016  AGIM KINGCADE [birth year] [id number]  Patient assess for 3 admissions in the past 6 months. Patient is in the [facility] ACO Registry.  As per chart review the patient is Herbert Marquez a 83 y.o.right handed malewith history of CAD s/p stent, PAF, HTN,  T2DM, left thalamic infarct 9/17--on ASA/plavix PTA, RLE weakness post R-THR; who was admitted on 03/30/16 with difficulty speaking and confusion. CT head without acute abnormality and was treated with tPA. MRI brain negative and limited due to motion.   CTA reveals left temporal lobe hemorrhagic infarct.  Patient's current disposition is inpatient CIR verses a skilled nursing facility for rehab. No current community follow up needed as the patient will have rehab.  For questions, please contact:  Natividad Brood, RN BSN [facility] Liaison  [phone] business mobile phone Toll free office [phone]

## 2016-04-03 NOTE — Progress Notes (Signed)
Pt. with change in cardiac rhythm and tachycardic this am.  Discussed case with Burnetta Sabin and Dr. Erlinda Hong..  Pt's metropolol has been restarted.  Discussed with rehab team with recommendation to watch pt. on telemetry overnight before transition to rehab.  I have updated the patient and wife of the plan.  I anticipate admission to CIR tomorrow if pt. medically stable.  Please call if questions.  [place] Admissions Coordinator Cell [phone] Office [phone]

## 2016-04-04 ENCOUNTER — Inpatient Hospital Stay (HOSPITAL_COMMUNITY)
Admission: RE | Admit: 2016-04-04 | Discharge: 2016-04-14 | DRG: 092 | Disposition: A | Discharge status: Home Health | Payer: Medicare Other | Source: Intra-hospital | Attending: Physical Medicine & Rehabilitation | Admitting: Physical Medicine & Rehabilitation

## 2016-04-04 ENCOUNTER — Encounter (HOSPITAL_COMMUNITY): Payer: Self-pay | Admitting: *Deleted

## 2016-04-04 DIAGNOSIS — R269 Unspecified abnormalities of gait and mobility: Secondary | ICD-10-CM

## 2016-04-04 DIAGNOSIS — I69320 Aphasia following cerebral infarction: Secondary | ICD-10-CM | POA: Diagnosis not present

## 2016-04-04 DIAGNOSIS — D509 Iron deficiency anemia, unspecified: Secondary | ICD-10-CM | POA: Diagnosis present

## 2016-04-04 DIAGNOSIS — G8929 Other chronic pain: Secondary | ICD-10-CM

## 2016-04-04 DIAGNOSIS — M25561 Pain in right knee: Secondary | ICD-10-CM

## 2016-04-04 DIAGNOSIS — F419 Anxiety disorder, unspecified: Secondary | ICD-10-CM | POA: Diagnosis present

## 2016-04-04 DIAGNOSIS — B952 Enterococcus as the cause of diseases classified elsewhere: Secondary | ICD-10-CM

## 2016-04-04 DIAGNOSIS — I619 Nontraumatic intracerebral hemorrhage, unspecified: Secondary | ICD-10-CM

## 2016-04-04 DIAGNOSIS — I69393 Ataxia following cerebral infarction: Secondary | ICD-10-CM

## 2016-04-04 DIAGNOSIS — R4189 Other symptoms and signs involving cognitive functions and awareness: Secondary | ICD-10-CM | POA: Diagnosis present

## 2016-04-04 DIAGNOSIS — Z85828 Personal history of other malignant neoplasm of skin: Secondary | ICD-10-CM

## 2016-04-04 DIAGNOSIS — R2689 Other abnormalities of gait and mobility: Secondary | ICD-10-CM | POA: Diagnosis present

## 2016-04-04 DIAGNOSIS — K219 Gastro-esophageal reflux disease without esophagitis: Secondary | ICD-10-CM | POA: Diagnosis present

## 2016-04-04 DIAGNOSIS — B9689 Other specified bacterial agents as the cause of diseases classified elsewhere: Secondary | ICD-10-CM | POA: Diagnosis present

## 2016-04-04 DIAGNOSIS — M1711 Unilateral primary osteoarthritis, right knee: Secondary | ICD-10-CM | POA: Diagnosis present

## 2016-04-04 DIAGNOSIS — R7303 Prediabetes: Secondary | ICD-10-CM | POA: Diagnosis present

## 2016-04-04 DIAGNOSIS — I2510 Atherosclerotic heart disease of native coronary artery without angina pectoris: Secondary | ICD-10-CM | POA: Diagnosis present

## 2016-04-04 DIAGNOSIS — I480 Paroxysmal atrial fibrillation: Secondary | ICD-10-CM | POA: Diagnosis present

## 2016-04-04 DIAGNOSIS — Z955 Presence of coronary angioplasty implant and graft: Secondary | ICD-10-CM | POA: Diagnosis not present

## 2016-04-04 DIAGNOSIS — G4700 Insomnia, unspecified: Secondary | ICD-10-CM | POA: Diagnosis present

## 2016-04-04 DIAGNOSIS — I63412 Cerebral infarction due to embolism of left middle cerebral artery: Secondary | ICD-10-CM | POA: Diagnosis not present

## 2016-04-04 DIAGNOSIS — N390 Urinary tract infection, site not specified: Secondary | ICD-10-CM | POA: Diagnosis present

## 2016-04-04 DIAGNOSIS — Z87891 Personal history of nicotine dependence: Secondary | ICD-10-CM

## 2016-04-04 DIAGNOSIS — D72829 Elevated white blood cell count, unspecified: Secondary | ICD-10-CM

## 2016-04-04 DIAGNOSIS — N400 Enlarged prostate without lower urinary tract symptoms: Secondary | ICD-10-CM | POA: Diagnosis present

## 2016-04-04 DIAGNOSIS — R400 Somnolence: Secondary | ICD-10-CM | POA: Diagnosis present

## 2016-04-04 DIAGNOSIS — Z7902 Long term (current) use of antithrombotics/antiplatelets: Secondary | ICD-10-CM

## 2016-04-04 DIAGNOSIS — E785 Hyperlipidemia, unspecified: Secondary | ICD-10-CM | POA: Diagnosis present

## 2016-04-04 DIAGNOSIS — I69398 Other sequelae of cerebral infarction: Secondary | ICD-10-CM

## 2016-04-04 DIAGNOSIS — M25562 Pain in left knee: Secondary | ICD-10-CM

## 2016-04-04 DIAGNOSIS — Z7982 Long term (current) use of aspirin: Secondary | ICD-10-CM

## 2016-04-04 DIAGNOSIS — I482 Chronic atrial fibrillation: Secondary | ICD-10-CM | POA: Diagnosis not present

## 2016-04-04 DIAGNOSIS — D508 Other iron deficiency anemias: Secondary | ICD-10-CM

## 2016-04-04 DIAGNOSIS — Z8673 Personal history of transient ischemic attack (TIA), and cerebral infarction without residual deficits: Secondary | ICD-10-CM

## 2016-04-04 DIAGNOSIS — Z96641 Presence of right artificial hip joint: Secondary | ICD-10-CM | POA: Diagnosis present

## 2016-04-04 DIAGNOSIS — H9190 Unspecified hearing loss, unspecified ear: Secondary | ICD-10-CM | POA: Diagnosis present

## 2016-04-04 DIAGNOSIS — E871 Hypo-osmolality and hyponatremia: Secondary | ICD-10-CM | POA: Diagnosis present

## 2016-04-04 DIAGNOSIS — N179 Acute kidney failure, unspecified: Secondary | ICD-10-CM | POA: Diagnosis present

## 2016-04-04 DIAGNOSIS — G8191 Hemiplegia, unspecified affecting right dominant side: Secondary | ICD-10-CM | POA: Diagnosis not present

## 2016-04-04 DIAGNOSIS — I10 Essential (primary) hypertension: Secondary | ICD-10-CM | POA: Diagnosis present

## 2016-04-04 DIAGNOSIS — Z8249 Family history of ischemic heart disease and other diseases of the circulatory system: Secondary | ICD-10-CM

## 2016-04-04 DIAGNOSIS — Z8546 Personal history of malignant neoplasm of prostate: Secondary | ICD-10-CM

## 2016-04-04 DIAGNOSIS — Z79899 Other long term (current) drug therapy: Secondary | ICD-10-CM

## 2016-04-04 HISTORY — DX: Personal history of transient ischemic attack (TIA), and cerebral infarction without residual deficits: Z86.73

## 2016-04-04 LAB — BASIC METABOLIC PANEL
Anion gap: 8 (ref 5–15)
BUN: 11 mg/dL (ref 6–20)
CO2: 22 mmol/L (ref 22–32)
Calcium: 8.5 mg/dL — ABNORMAL LOW (ref 8.9–10.3)
Chloride: 96 mmol/L — ABNORMAL LOW (ref 101–111)
Creatinine, Ser: 0.93 mg/dL (ref 0.61–1.24)
GFR calc Af Amer: >60 mL/min (ref >60)
GFR calc non Af Amer: >60 mL/min (ref >60)
Glucose, Bld: 106 mg/dL — ABNORMAL HIGH (ref 65–99)
Potassium: 3.7 mmol/L (ref 3.5–5.1)
Sodium: 126 mmol/L — ABNORMAL LOW (ref 135–145)

## 2016-04-04 LAB — CBC
HCT: 32.5 % — ABNORMAL LOW (ref 39.0–52.0)
Hemoglobin: 10.8 g/dL — ABNORMAL LOW (ref 13.0–17.0)
MCH: 30 pg (ref 26.0–34.0)
MCHC: 33.2 g/dL (ref 30.0–36.0)
MCV: 90.3 fL (ref 78.0–100.0)
Platelets: 364 10*3/uL (ref 150–400)
RBC: 3.6 MIL/uL — ABNORMAL LOW (ref 4.22–5.81)
RDW: 14.6 % (ref 11.5–15.5)
WBC: 10.9 10*3/uL — ABNORMAL HIGH (ref 4.0–10.5)

## 2016-04-04 LAB — URINALYSIS, ROUTINE W REFLEX MICROSCOPIC
Bilirubin Urine: NEGATIVE
Glucose, UA: NEGATIVE mg/dL
Hgb urine dipstick: NEGATIVE
Ketones, ur: NEGATIVE mg/dL
Nitrite: NEGATIVE
Protein, ur: 30 mg/dL — AB
Specific Gravity, Urine: 1.019 (ref 1.005–1.030)
Squamous Epithelial / HPF: NONE SEEN
pH: 6 (ref 5.0–8.0)

## 2016-04-04 LAB — GLUCOSE, CAPILLARY
Glucose-Capillary: 96 mg/dL (ref 65–99)
Glucose-Capillary: 97 mg/dL (ref 65–99)

## 2016-04-04 MED ORDER — PROCHLORPERAZINE MALEATE 5 MG PO TABS: 5.0000 mg | ORAL_TABLET | Freq: Four times a day (QID) | ORAL | Status: DC | PRN

## 2016-04-04 MED ORDER — DICLOFENAC SODIUM 1 % TD GEL
2.0000 g | Freq: Three times a day (TID) | TRANSDERMAL | Status: DC
  Administered 2016-04-05 – 2016-04-13 (×26): 2 g via TOPICAL
  Filled 2016-04-04: qty 100

## 2016-04-04 MED ORDER — PANTOPRAZOLE SODIUM 40 MG PO TBEC
40.0000 mg | DELAYED_RELEASE_TABLET | Freq: Every day | ORAL | Status: DC
  Administered 2016-04-05 – 2016-04-14 (×10): 40 mg via ORAL
  Filled 2016-04-04 (×10): qty 1

## 2016-04-04 MED ORDER — ADULT MULTIVITAMIN W/MINERALS CH
1.0000 | ORAL_TABLET | Freq: Every day | ORAL | Status: DC
  Administered 2016-04-05 – 2016-04-14 (×10): 1 via ORAL
  Filled 2016-04-04 (×10): qty 1

## 2016-04-04 MED ORDER — ALUM & MAG HYDROXIDE-SIMETH 200-200-20 MG/5ML PO SUSP: 30.0000 mL | ORAL | Status: DC | PRN

## 2016-04-04 MED ORDER — PROCHLORPERAZINE EDISYLATE 5 MG/ML IJ SOLN: 5.0000 mg | Freq: Four times a day (QID) | INTRAMUSCULAR | Status: DC | PRN

## 2016-04-04 MED ORDER — GUAIFENESIN-DM 100-10 MG/5ML PO SYRP: 5.0000 mL | ORAL_SOLUTION | Freq: Four times a day (QID) | ORAL | Status: DC | PRN

## 2016-04-04 MED ORDER — MIRABEGRON ER 50 MG PO TB24
50.0000 mg | ORAL_TABLET | Freq: Every day | ORAL | Status: DC
  Administered 2016-04-05 – 2016-04-14 (×10): 50 mg via ORAL
  Filled 2016-04-04 (×10): qty 1

## 2016-04-04 MED ORDER — ATORVASTATIN CALCIUM 40 MG PO TABS
40.0000 mg | ORAL_TABLET | Freq: Every day | ORAL | Status: DC
  Administered 2016-04-05 – 2016-04-13 (×9): 40 mg via ORAL
  Filled 2016-04-04 (×9): qty 1

## 2016-04-04 MED ORDER — SENNOSIDES-DOCUSATE SODIUM 8.6-50 MG PO TABS: 1.0000 | ORAL_TABLET | Freq: Every evening | ORAL | Status: DC | PRN

## 2016-04-04 MED ORDER — ACETAMINOPHEN 325 MG PO TABS: 650.0000 mg | ORAL_TABLET | Freq: Four times a day (QID) | ORAL | Status: DC | PRN

## 2016-04-04 MED ORDER — BISACODYL 10 MG RE SUPP: 10.0000 mg | Freq: Every day | RECTAL | Status: DC | PRN

## 2016-04-04 MED ORDER — DIPHENHYDRAMINE HCL 12.5 MG/5ML PO ELIX: 12.5000 mg | ORAL_SOLUTION | Freq: Four times a day (QID) | ORAL | Status: DC | PRN

## 2016-04-04 MED ORDER — FLEET ENEMA 7-19 GM/118ML RE ENEM: 1.0000 | ENEMA | Freq: Once | RECTAL | Status: DC | PRN

## 2016-04-04 MED ORDER — METHOCARBAMOL 500 MG PO TABS: 250.0000 mg | ORAL_TABLET | Freq: Four times a day (QID) | ORAL | Status: DC | PRN

## 2016-04-04 MED ORDER — POLYETHYLENE GLYCOL 3350 17 G PO PACK: 17.0000 g | PACK | Freq: Every day | ORAL | Status: DC | PRN

## 2016-04-04 MED ORDER — ENOXAPARIN SODIUM 40 MG/0.4ML ~~LOC~~ SOLN
40.0000 mg | SUBCUTANEOUS | Status: DC
  Administered 2016-04-05 – 2016-04-12 (×8): 40 mg via SUBCUTANEOUS
  Filled 2016-04-04 (×8): qty 0.4

## 2016-04-04 MED ORDER — METOPROLOL TARTRATE 12.5 MG HALF TABLET
12.5000 mg | ORAL_TABLET | Freq: Two times a day (BID) | ORAL | Status: DC
  Administered 2016-04-04 – 2016-04-06 (×4): 12.5 mg via ORAL
  Filled 2016-04-04 (×5): qty 1

## 2016-04-04 MED ORDER — TRAZODONE HCL 50 MG PO TABS: 25.0000 mg | ORAL_TABLET | Freq: Every evening | ORAL | Status: DC | PRN

## 2016-04-04 MED ORDER — PROCHLORPERAZINE 25 MG RE SUPP: 12.5000 mg | Freq: Four times a day (QID) | RECTAL | Status: DC | PRN

## 2016-04-04 MED ORDER — FERROUS SULFATE 325 (65 FE) MG PO TABS
325.0000 mg | ORAL_TABLET | Freq: Two times a day (BID) | ORAL | Status: DC
  Administered 2016-04-05 – 2016-04-14 (×18): 325 mg via ORAL
  Filled 2016-04-04 (×19): qty 1

## 2016-04-04 MED ORDER — TAMSULOSIN HCL 0.4 MG PO CAPS
0.4000 mg | ORAL_CAPSULE | ORAL | Status: DC
  Administered 2016-04-06 – 2016-04-14 (×5): 0.4 mg via ORAL
  Filled 2016-04-04 (×6): qty 1

## 2016-04-04 MED ORDER — FERROUS SULFATE 325 (65 FE) MG PO TABS
325.0000 mg | ORAL_TABLET | Freq: Two times a day (BID) | ORAL | Status: DC
  Administered 2016-04-04 (×2): 325 mg via ORAL
  Filled 2016-04-04 (×2): qty 1

## 2016-04-04 MED ORDER — CITALOPRAM HYDROBROMIDE 10 MG PO TABS
5.0000 mg | ORAL_TABLET | Freq: Every day | ORAL | Status: DC
  Administered 2016-04-05: 5 mg via ORAL
  Filled 2016-04-04: qty 1

## 2016-04-04 MED ORDER — ACETAMINOPHEN 325 MG PO TABS
325.0000 mg | ORAL_TABLET | ORAL | Status: DC | PRN
  Administered 2016-04-11: 650 mg via ORAL
  Filled 2016-04-04 (×2): qty 2

## 2016-04-04 NOTE — Progress Notes (Signed)
Pt transferring to 4W20 taking all personal belongings. Wife at bedside. Report called in to nurse.

## 2016-04-04 NOTE — Discharge Summary (Signed)
Stroke Discharge Summary  Patient ID: Herbert Marquez   MRN: [id number]      DOB: [birth year]  Date of Admission: 03/30/2016 Date of Discharge: 04/04/2016  Attending Physician:  Rosalin Hawking, MD, Stroke MD Consultant(s):   Rowe Clack, RN [facility] care management), Delice Lesch, MD (Physical Medicine & Rehabtilitation)  Patient's PCP:  Lillard Anes, NP  Discharge Diagnoses:  Principal Problem:   Acute ischemic stroke ([place]) - L temporal lobe s/p tPA Active Problems:   HLD (hyperlipidemia)   Hyponatremia   Cerebral hemorrhage ([facility]) w/ SDH s/p IV tPA   Coronary artery disease involving native coronary artery of native heart without angina pectoris   Paroxysmal atrial fibrillation with RVR ([place])   Labile blood pressure   Hypotension   Controlled type 2 DM with peripheral circulatory disorder ([place])   History of right hip replacement   Acute blood loss anemia   Iron deficiency anemia   Past Medical History:  Diagnosis Date  . Arthritis    "pretty much all over"   . Basal cell carcinoma    "several burned off his body, face, head"  . BPH (benign prostatic hypertrophy)   . Coronary artery disease    a. s/p PCI of RCA in 2006  . CVA (cerebral infarction)    a. 06/2015: left thalamic and bilateral PCA  . GERD (gastroesophageal reflux disease)   . Hyperlipidemia   . Hypertension   . TIA (transient ischemic attack)    Approximately 6 weeks post-cardiac catheterization.   . Type II diabetes mellitus ([place])    "prediabetic; lost alot of weight; not diabetic now" (06/16/2015)   Past Surgical History:  Procedure Laterality Date  . CARDIOVASCULAR STRESS TEST  07/01/2007   EF 74%  . CATARACT EXTRACTION, BILATERAL    . CORONARY ANGIOPLASTY WITH STENT PLACEMENT  10/2004   stenting x 2 to RCA  . FEMUR IM NAIL Right 11/26/2015   Procedure: INTRAMEDULLARY RIGHT (IM) NAIL FEMORAL;  Surgeon: Rod Can, MD;  Location: [facility];  Service: Orthopedics;  Laterality: Right;  . HERNIA REPAIR    .  HIP ARTHROPLASTY  03/09/2011   Procedure: ARTHROPLASTY BIPOLAR HIP;  Surgeon: Mauri Pole;  Location: [facility];  Service: Orthopedics;  Laterality: Left;  . LAPAROSCOPIC INCISIONAL / UMBILICAL / VENTRAL HERNIA REPAIR     "below his naval"    Medications to be continued on Rehab .  stroke: mapping our early stages of recovery book   Does not apply Once  . atorvastatin  40 mg Oral q1800  . citalopram  10 mg Oral Daily  . ferrous sulfate  325 mg Oral BID WC  . insulin aspart  0-9 Units Subcutaneous TID WC  . metoprolol tartrate  12.5 mg Oral BID  . mirabegron ER  50 mg Oral Daily  . multivitamin with minerals  1 tablet Oral Daily  . pantoprazole  40 mg Oral Daily  . tamsulosin  0.4 mg Oral QODAY    LABORATORY STUDIES CBC    Component Value Date/Time   WBC 10.9 (H) 04/04/2016 0537   RBC 3.60 (L) 04/04/2016 0537   HGB 10.8 (L) 04/04/2016 0537   HCT 32.5 (L) 04/04/2016 0537   PLT 364 04/04/2016 0537   MCV 90.3 04/04/2016 0537   MCH 30.0 04/04/2016 0537   MCHC 33.2 04/04/2016 0537   RDW 14.6 04/04/2016 0537   LYMPHSABS 2.4 03/30/2016 1249   MONOABS 0.9 03/30/2016 1249   EOSABS 0.2 03/30/2016 1249  BASOSABS 0.0 03/30/2016 1249   CMP    Component Value Date/Time   NA 126 (L) 04/04/2016 0537   K 3.7 04/04/2016 0537   CL 96 (L) 04/04/2016 0537   CO2 22 04/04/2016 0537   GLUCOSE 106 (H) 04/04/2016 0537   BUN 11 04/04/2016 0537   CREATININE 0.93 04/04/2016 0537   CREATININE 1.36 (H) 06/14/2015 0901   CALCIUM 8.5 (L) 04/04/2016 0537   PROT 6.9 03/30/2016 1249   ALBUMIN 3.5 03/30/2016 1249   AST 47 (H) 03/30/2016 1249   ALT 20 03/30/2016 1249   ALKPHOS 87 03/30/2016 1249   BILITOT 0.9 03/30/2016 1249   GFRNONAA >60 04/04/2016 0537   GFRAA >60 04/04/2016 0537   COAGS Lab Results  Component Value Date   INR 1.09 03/30/2016   INR 1.13 11/25/2015   INR 1.27 11/04/2015   Lipid Panel    Component Value Date/Time   CHOL 77 03/31/2016 0353   TRIG 37 03/31/2016 0353    HDL 32 (L) 03/31/2016 0353   CHOLHDL 2.4 03/31/2016 0353   VLDL 7 03/31/2016 0353   LDLCALC 38 03/31/2016 0353   HgbA1C  Lab Results  Component Value Date   HGBA1C 5.0 03/31/2016    SIGNIFICANT DIAGNOSTIC STUDIES I have personally reviewed the radiological images below and agree with the radiology interpretations.  Ct Head Code Stroke W/o Cm 03/30/2016 1. No evidence of acute intracranial abnormality.  2. ASPECTS is 10.  3. Moderate chronic small vessel ischemic disease and chronic left thalamic infarct.   MRI Head  03/31/2016 Limited examination. Marked motion degrades image quality. No acute abnormality. But able to tell there is left temporal acute infarct with hemorrhagic transformation.  CTA Head and Neck 03/31/1016 1. Left temporal lobe hemorrhagic infarct. The parenchymal hematoma measures 4 cc volume. Subdural extension in the middle cranial fossa without significant mass effect. 2. No emergent large vessel occlusion. 3. Atherosclerosis with 60-70% proximal right ICA and 40-50% proximal left ICA stenosis. Mild moderate right supraclinoid ICA stenosis. No significant stenosis in the posterior circulation. 4. Pulmonary fibrosis.  TTE  11/28/2015 - Left ventricle: The cavity size was normal. Wall thickness wasincreased in a pattern of mild LVH. Systolic function wasvigorous. The estimated ejection fraction was in the range of 65% to 70%. - Right ventricle: The cavity size was mildly dilated.     HISTORY OF PRESENT ILLNESS This is an 83-yo RH man who presents to the ED for the evaluation of speech changes. History is obtained from the patient's wife and daughter who are present at the bedside. The patient has expressive aphasia and is unable to provide any information.   His wife reports that he went to his rehab appointment this morning and was in his usual state of health. They were on their way home and stopped at the grocery store. He stayed in the car while she  went inside. When she returned, she says that he was unable to figure out how to unlock the car door. When she got in the car she noted that his language was not making sense. (LKW [id number] on 03/30/2016) She brought him to the ED where CODE STROKE was activated. Dr. Shon Hale met the patient in the ED. He was taken for an emergent CT of the head which did not reveal any acute abnormality. He was noted to have expressive aphasia with mild dysarthria. NIHSS was 6 but 2 points were for inability to lift his R leg off the bed because of chronic weakness  following R THA in September 2017.   After discussion with the patient's wife and daughter, the decision was made to proceed with tPA. He was given a bolus of 7 mg followed by an infusion of 62 mg. The bolus was initially started at 1321. However, due to air microbubbles in the line the tPA would not infuse. After exhaustive attempts to eliminate the bubbles, a new bottle of tPA was mixed by pharmacy. In the meantime, the patient developed severe HTN with SBP 260s, DBP 200s. He was given labetalol 10 mg IV and nicardipine drip was started with excellent response. BP initially dropped to 118/81 with no change in his neurologic status. His nicardipine was stopped and BP has remained SBP 140s, DBP 80s. His tPA was then infused with bolus given at 1357 and infusion started at 1358. His neurologic exam has remained stable.     HOSPITAL COURSE Herbert Marquez is a 83 y.o. male with history of DM, previous stroke, HTN, HLD, afib 11/28/2015 post right hip fracture surgery, and CAD s/p PCI, presenting with speech difficulties and aphasia.  He received IV t-PA Friday, 03/30/2016 at 1330.  Stroke:  Left temporal lobe infarct s/p IV tPA with hemorrhagic transformation/SDH, embolic pattern most likely secondary to afib not on AC  Resultant aphasia largely resolved  MRI - Marked motion degrades image quality. Left temporal infarct with hematoma.  MRA - not tolerate  CTA  H&N - Left temporal lobe hemorrhagic infarct with subdural extension. 60-70% proximal right ICA, 40-50% left ICA  2D Echo - 11/28/2015 - EF 65 - 70%. No cardiac source of emboli identified.  LDL - 38  HgbA1c - 5.0  aspirin 81 mg daily and clopidogrel 75 mg daily prior to admission, now on No antithrombotic due to hemorrhagic transformation. Consider to start ASA at day 7 post stroke and then anticoagulation once blood absorbed.  Ongoing aggressive stroke risk factor management  Therapy recommendations:  CIR   Disposition: CIR  Paroxysmal afib   Found in 11/2015 during admission for right hip fracture  On eliquis on discharge  Discontinued by orthopedic surgeon (? Thinking post op afib)  PTA on ASA and plavix  No bleeding on eliquis as per wife  Will need to start ASA at day 7 post stroke and resume anticoagulation once hemorrhagic transformation absorbed.   One episode 04/03/16 on tele, HR 120-130s, now back to sinus  Resumed metoprolol but need close monitor BP to avoid hypotension  Hx of stroke  06/2015 - b/l thalamic, right mesial temporal lobe - MRA and CTA head left PCA occlusion and right PCA high grade stenosis - MRA CTA neck negative - EEG neg - EF 55-60% - LDL 53 A1C 5.6  Stroke concerning for embolic event   outpt 30 day event monitoring neg for afib  Hypertension  Blood pressure tends to run low. Monitor given metoprolol for atrial fibrillation.  BP goal normotensive.  Hyperlipidemia  Home meds: Lipitor 80 mg daily resumed in hospital  LDL 38, goal < 70  Decreased lipitor to 73m  Continue statin at discharge  Diabetes  HgbA1c 5.0, goal < 7.0  Anemia  Hb 13.6->10.6->10.3->10.8->10.8  Iron level low - added supplememt  Other Stroke Risk Factors  Advanced age  Former cigarette smoker  ETOH use, advised to drink no more than 1 drink per day  Coronary artery disease  Other Active Problems  Hyponatremia -  [id number]  Right hip fracture - s/p surgery   DISCHARGE EXAM Blood  pressure (!) 120/55, pulse 65, temperature 98.2 F (36.8 C), temperature source Oral, resp. rate 19, height _0  (1.702 m), weight 74.5 kg (164 lb 3.9 oz), SpO2 98 %. General - Well nourished, well developed, no acute distress  Ophthalmologic - Fundi not visualized due to noncooperation.  Cardiovascular - Regular rate and rhythm.  Mental Status -  Awake alert, orientated to place, and people, but not to time Follows midline and peripheral commands, no significant expressive aphasia, naming intact and able to repeat simple sentences. Mild dysarthria   Cranial Nerves II - XII - II - Visual field grossly intact III, IV, VI - Extraocular movements intact. V - Facial sensation intact bilaterally. VII - Facial movement intact bilaterally. VIII - hard of hearing & vestibular intact bilaterally. X - Palate elevates symmetrically. XI - Chin turning & shoulder shrug intact bilaterally. XII - Tongue protrusion intact.  Motor Strength - The patient's strength was equal symmetrical in all extremities at least 4/5 and pronator drift was absent. However, pt seems to have right hip pain after surgery which limited right leg movement.  Bulk was normal and fasciculations were absent.   Motor Tone - Muscle tone was assessed at the neck and appendages and was normal.  Reflexes - The patient's reflexes were 1+ in all extremities and he had no pathological reflexes.  Sensory - symmetrical   Coordination - no ataxia or dysmetria but slow on action.  Tremor was absent.  Gait and Station - deferred.  Discharge Diet  Diet Heart Room service appropriate? Yes; Fluid consistency: Thin liquids  DISCHARGE PLAN  Disposition:  Transfer to [facility] for ongoing PT, OT and ST  Consider to start ASA at day 7 post stroke and then anticoagulation once blood absorbed.  Recommend ongoing risk factor control by  Primary Care Physician at time of discharge from inpatient rehabilitation.  Follow-up Lillard Anes, NP in 2 weeks following discharge from rehab.  Follow-up with Dr. Rosalin Hawking, Stroke Clinic in 6 weeks, office to schedule an appointment.   40 minutes were spent preparing discharge.  Rosalin Hawking, MD PhD Stroke Neurology 04/05/2016 12:08 AM
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
## 2016-04-04 NOTE — Progress Notes (Signed)
STROKE TEAM PROGRESS NOTE   SUBJECTIVE (INTERVAL HISTORY) His wife is at the bedside.  He had episode of Afib RVR. Resumed his home metoprolol. Now back to sinus rhythm. CIR is following pt for admission tomorrow.    OBJECTIVE Temp:  [98.3 F (36.8 C)-100.1 F (37.8 C)] 100.1 F (37.8 C) (01/30 2202) Pulse Rate:  [69-126] 69 (01/30 2202) Cardiac Rhythm: Normal sinus rhythm (01/30 1930) Resp:  [16-20] 20 (01/30 2202) BP: (109-116)/(54-69) 112/54 (01/30 2202) SpO2:  [94 %-98 %] 97 % (01/30 2202)  CBC:   Recent Labs Lab 03/30/16 1249  04/02/16 0212 04/03/16 0318  WBC 10.7*  < > 10.3 10.1  NEUTROABS 7.2  --   --   --   HGB 13.1  < > 10.3* 10.8*  HCT 38.7*  < > 31.5* 32.3*  MCV 91.5  < > 91.6 91.2  PLT 392  < > 315 305  < > = values in this interval not displayed.  Basic Metabolic Panel:   Recent Labs Lab 04/02/16 0212 04/03/16 0318  NA 129* 129*  K 3.4* 3.6  CL 102 98*  CO2 22 22  GLUCOSE 107* 107*  BUN 9 10  CREATININE 0.93 0.80  CALCIUM 8.3* 8.4*    Lipid Panel:     Component Value Date/Time   CHOL 77 03/31/2016 0353   TRIG 37 03/31/2016 0353   HDL 32 (L) 03/31/2016 0353   CHOLHDL 2.4 03/31/2016 0353   VLDL 7 03/31/2016 0353   LDLCALC 38 03/31/2016 0353   HgbA1c:  Lab Results  Component Value Date   HGBA1C 5.0 03/31/2016   Urine Drug Screen:     Component Value Date/Time   LABOPIA NONE DETECTED 06/16/2015 2037   COCAINSCRNUR NONE DETECTED 06/16/2015 2037   LABBENZ NONE DETECTED 06/16/2015 2037   AMPHETMU NONE DETECTED 06/16/2015 2037   THCU NONE DETECTED 06/16/2015 2037   LABBARB NONE DETECTED 06/16/2015 2037      IMAGING I have personally reviewed the radiological images below and agree with the radiology interpretations. Blue text is my interpretation  Ct Head Code Stroke W/o Cm 03/30/2016 1. No evidence of acute intracranial abnormality.  2. ASPECTS is 10.  3. Moderate chronic small vessel ischemic disease and chronic left thalamic  infarct.   MRI Head  03/31/2016 Limited examination.  Marked motion degrades image quality. No acute abnormality. But able to tell there is left temporal acute infarct with hemorrhagic transformation.  CTA Head and Neck 03/31/1016 1. Left temporal lobe hemorrhagic infarct. The parenchymal hematoma measures 4 cc volume. Subdural extension in the middle cranial fossa without significant mass effect. 2. No emergent large vessel occlusion. 3. Atherosclerosis with 60-70% proximal right ICA and 40-50% proximal left ICA stenosis. Mild moderate right supraclinoid ICA stenosis. No significant stenosis in the posterior circulation. 4. Pulmonary fibrosis.  TTE  11/28/2015 - Left ventricle: The cavity size was normal. Wall thickness was   increased in a pattern of mild LVH. Systolic function was   vigorous. The estimated ejection fraction was in the range of 65% to 70%. - Right ventricle: The cavity size was mildly dilated.    PHYSICAL EXAM  Temp:  [98.3 F (36.8 C)-100.1 F (37.8 C)] 100.1 F (37.8 C) (01/30 2202) Pulse Rate:  [69-126] 69 (01/30 2202) Resp:  [16-20] 20 (01/30 2202) BP: (109-116)/(54-69) 112/54 (01/30 2202) SpO2:  [94 %-98 %] 97 % (01/30 2202)  General - Well nourished, well developed, no acute distress  Ophthalmologic - Fundi not visualized due  to noncooperation.  Cardiovascular - Regular rate and rhythm.  Mental Status -  Awake alert, orientated to place, and people, but not to time Follows midline and peripheral commands, no significant expressive aphasia, naming intact and able to repeat simple sentences. Mild dysarthria   Cranial Nerves II - XII - II - Visual field grossly intact III, IV, VI - Extraocular movements intact. V - Facial sensation intact bilaterally. VII - Facial movement intact bilaterally. VIII - hard of hearing & vestibular intact bilaterally. X - Palate elevates symmetrically. XI - Chin turning & shoulder shrug intact bilaterally. XII -  Tongue protrusion intact.  Motor Strength - The patient's strength was equal symmetrical in all extremities at least 4/5 and pronator drift was absent. However, pt seems to have right hip pain after surgery which limited right leg movement.  Bulk was normal and fasciculations were absent.   Motor Tone - Muscle tone was assessed at the neck and appendages and was normal.  Reflexes - The patient's reflexes were 1+ in all extremities and he had no pathological reflexes.  Sensory - symmetrical   Coordination - no ataxia or dysmetria but slow on action.  Tremor was absent.  Gait and Station - deferred.   ASSESSMENT/PLAN [name] is a 83 y.o. male with history of DM, previous stroke, HTN, HLD, afib 11/28/2015 post right hip fracture surgery, and CAD s/p PCI, presenting with speech difficulties and aphasia.  He received IV t-PA Friday, 03/30/2016 at 1330.  Stroke:  Dominant left temporal lobe infarct with hemorrhagic transformation, embolic pattern most likely secondary to afib not on AC  Resultant aphasia largely resolved  MRI - Marked motion degrades image quality. Left temporal infarct with hematoma.  MRA - not tolerate  CTA H&N - Left temporal lobe hemorrhagic infarct with subdural extension. 60-70% proximal right ICA, 40-50% left ICA  2D Echo - 11/28/2015 - EF 65 - 70%. No cardiac source of emboli identified.  LDL - 38  HgbA1c - 5.0  VTE prophylaxis - SCDs Diet Heart Room service appropriate? Yes; Fluid consistency: Thin  aspirin 81 mg daily and clopidogrel 75 mg daily prior to admission, now on No antithrombotic due to hemorrhagic transformation. Consider to start ASA at day 7 post stroke and then anticoagulation once blood absorbed.  Patient counseled to be compliant with his antithrombotic medications  Ongoing aggressive stroke risk factor management  Therapy recommendations:  CIR   Disposition: Pending  Paroxysmal afib   Found in 11/2015 during admission for  right hip fracture  On eliquis on discharge  Discontinued by orthopedic surgeon (? Thinking post op afib)  PTA on ASA and plavix  No bleeding on eliquis as per wife  Will need to start ASA at day 7 post stroke and resume anticoagulation once hemorrhagic transformation absorbed.   One episode 04/03/16 on tele, HR 120-130s, now back to sinus  Resumed metoprolol but need close monitor BP to avoid hypotension  Hx of stroke  06/2015 - b/l thalamic, right mesial temporal lobe - MRA and CTA head left PCA occlusion and right PCA high grade stenosis - MRA CTA neck negative - EEG neg - EF 55-60% - LDL 53 A1C 5.6  Stroke concerning for embolic event   outpt 30 day event monitoring neg for afib  Hypertension  Blood pressure tends to run low  Permissive hypertension (OK if < 180/105) but gradually normalize in 5-7 days  Long-term BP goal normotensive  Hyperlipidemia  Home meds: Lipitor 80 mg daily  resumed in hospital  LDL 38, goal < 70  Decrease lipitor to 40mg   Continue statin at discharge  Diabetes  HgbA1c 5.0, goal < 7.0  Controlled  SSI  CBG monitoring  Anemia  Hb 13.6->10.6->10.3->10.8  CBC monitoring  Stool guaiac pending  Iron level low - add supplememt  Other Stroke Risk Factors  Advanced age  Former cigarette smoker  ETOH use, advised to drink no more than 1 drink per day  Coronary artery disease  Other Active Problems  Hyponatremia - 132-132-129-129  Right hip fracture - s/p surgery  Hospital day # 5   Rosalin Hawking, MD PhD Stroke Neurology 04/04/2016 12:24 AM   To contact Stroke Continuity provider, please refer to [email or web address] After hours, contact General Neurology

## 2016-04-04 NOTE — Progress Notes (Signed)
Orthopedic Tech Progress Note Patient Details:  Herbert Marquez [birth year] [id number]  Ortho Devices Type of Ortho Device: Soft collar Ortho Device/Splint Location: neck Ortho Device/Splint Interventions: Ordered, Application   Braulio Bosch 04/04/2016, 6:56 PM

## 2016-04-04 NOTE — Care Management Note (Signed)
Case Management Note  Patient Details  Name: Herbert Marquez MRN: [id number] Date of Birth: 03-05-1928  Subjective/Objective:                    Action/Plan: Pt discharging to CIR today. No further needs per CM.  Expected Discharge Date:  04/04/16               Expected Discharge Plan:  [place]  In-House Referral:     Discharge planning Services  CM Consult  Post Acute Care Choice:    Choice offered to:     DME Arranged:    DME Agency:     HH Arranged:    HH Agency:     Status of Service:  Completed, signed off  If discussed at [employer], dates discussed:    Additional Comments:  Pollie Friar, RN 04/04/2016, 12:52 PM

## 2016-04-04 NOTE — Progress Notes (Signed)
Patient ID: Herbert Marquez, male   DOB: October 20, 1927, 83 y.o.   MRN: [id number] Patient and family arrived from [id number] with RN and patient belongings. Patient and family oriented to room, rehab process, schedule, fall prevention plan, rehab safety plan, health resource notebook, and nurse call button. Patient resting comfortably in bed with family at bedside and no complaints of pain.

## 2016-04-04 NOTE — Progress Notes (Signed)
Physical Therapy Treatment Patient Details Name: Herbert Marquez MRN: [id number] DOB: [birth year] Today's Date: 04/04/2016    History of Present Illness 83 y.o.malewith history of DM, previous stroke, HTN, HLD, afib 11/28/2015 post right hip fracture surgery, and CAD s/p PCI,presenting with speechdifficulties and aphasia. HereceivedIV tPA Friday, 03/30/2016 at 1330. MRI + for acute stroke involving the L MCAterritory.    PT Comments    Pt able to tolerate more activity today compared to yesterday with minimal pain but still limited by fatigue. C/o right shoulder pain and minimal R knee pain but not hindering participation of activity. Pt requires moderate level of encouragement and verbal cues to participate in ambulation. He is hesitant to put weight through R LE during standing. Will continue to monitor as pt progresses with standing balance and gait training to prepare for CIR at d/c.    Follow Up Recommendations  CIR;Supervision/Assistance - 24 hour     Equipment Recommendations  None recommended by PT    Recommendations for Other Services       Precautions / Restrictions Precautions Precautions: Fall Restrictions Weight Bearing Restrictions: No    Mobility  Bed Mobility Overal bed mobility: Needs Assistance Bed Mobility: Rolling;Sidelying to Sit Rolling: Supervision Sidelying to sit: Min assist       General bed mobility comments: pt able to roll without assistance. Min A + B UE use of rail to elevate trunk into upright position to get to EOB. Pt limited due to pain in R shoulder and decreased motion in R knee. Cuing required to scoot forward at EOB.  Transfers Overall transfer level: Needs assistance Equipment used: Rolling walker (2 wheeled) Transfers: Sit to/from [facility] Sit to Stand: Mod assist;+2 physical assistance         General transfer comment: mod A +2 to power up from EOB into standing with cuing for hand placement.     Ambulation/Gait Ambulation/Gait assistance: Max assist;+2 physical assistance Ambulation Distance (Feet): 5 Feet Assistive device: Rolling walker (2 wheeled) Gait Pattern/deviations: Decreased stance time - right;Decreased weight shift to right;Trunk flexed;Narrow base of support;Antalgic;Decreased dorsiflexion - right;Step-to pattern Gait velocity: slow   General Gait Details: steps to chair; pt very limited due to fatigue and decreased ROM in R knee. Pt required cuing for upright posture and weight shifting to R side. Max A +2 for safety    Stairs            Wheelchair Mobility    Modified Rankin (Stroke Patients Only) Modified Rankin (Stroke Patients Only) Pre-Morbid Rankin Score: Moderate disability Modified Rankin: Moderately severe disability     Balance Overall balance assessment: Needs assistance   Sitting balance-Leahy Scale: Good Sitting balance - Comments: pt able to sit without support   Standing balance support: Bilateral upper extremity supported;During functional activity Standing balance-Leahy Scale: Poor Standing balance comment: pt able to stand with RW but requires max A to maintain upright position due to minimal weight shift on R leg and pt fear of falling.                     Cognition Arousal/Alertness: Awake/alert Behavior During Therapy: WFL for tasks assessed/performed Overall Cognitive Status: Within Functional Limits for tasks assessed                      Exercises Total Joint Exercises Quad Sets: Strengthening;Both;10 reps;Seated Long Arc Quad: Seated;PROM;AROM;Both;10 reps;15 reps (Right knee x10 AROM, x5 PROM; Left knee x10 AROM) Other  Exercises Other Exercises: showed pt neck ROM in sitting to help minimize neck/ shoulder pain    General Comments        Pertinent Vitals/Pain Faces Pain Scale: Hurts little more Pain Location: R shoulder, posterior neck Pain Intervention(s): Premedicated before  session;Repositioned;Monitored during session;Heat applied    Home Living                 Additional Comments: (P) Pt. was on CIR April- May 2017 following CVA    Prior Function            PT Goals (current goals can now be found in the care plan section) Acute Rehab PT Goals Patient Stated Goal: none stated Potential to Achieve Goals: Good Progress towards PT goals: Progressing toward goals    Frequency    Min 3X/week      PT Plan Current plan remains appropriate    Co-evaluation             End of Session Equipment Utilized During Treatment: Gait belt Activity Tolerance: Patient tolerated treatment well;Patient limited by fatigue Patient left: with call bell/phone within reach;with family/visitor present;in chair     Time: [id number] PT Time Calculation (min) (ACUTE ONLY): 31 min  Charges:  $Therapeutic Exercise: 8-22 mins $Therapeutic Activity: 8-22 mins                    G Codes:      [facility] 04-22-2016, 1:58 PM [place], [place] Pager [phone]
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
## 2016-04-04 NOTE — Progress Notes (Signed)
Inpatient Rehabilitation  Pt. has tolerated his therapy session well this am.  No events overnight.  Burnetta Sabin, NP has given medical clearance for pt. to admit to CIR today.  Pt. and wife are pleased and in agreement.  I will make all arrangements for pt. to admit later today. I have updated RNCM and will update pt's RN.   Please call if questions.  [facility] Admissions Coordinator Cell [phone] Office [phone]

## 2016-04-04 NOTE — H&P (Signed)
Physical Medicine and Rehabilitation Admission H&P    Chief Complaint  Patient presents with  . Right sided weakness, slurred speech and expressive deficits    HPI: Herbert Marquez is a 83 y.o. right handed male with history of CAD s/p stent, PAF, HTN,  T2DM, left thalamic infarct 9/17--on ASA/plavix PTA, RLE weakness post R-THR; who was admitted on 03/30/16 with difficulty speaking and confusion. History taken from chart review and wife.  CT head without acute abnormality and was treated with tPA. MRI brain reviewed, negative, but limited due to motion.   Follow up  CTA head and neck done revealing left temporal lobe hemorrhagic infarct with subdural extension in middle cranial fossa, 60-70% R-ICA stenosis and 40-50% L-ICA stenosis.  2D echo with EF 65- 70% with mild LVH. Dr. Erlinda Hong felt that stroke embolic due to A fib with hemorrhagic conversion. He recommends "considering starting ASA day 7 post stroke and then anticoagulation once blood absorbed."  He had Afib with HR in 130s that has improved with addition of low dose BB. Verbal output improving with minimal word finding deficits.  Patient with resultant expressive deficits, right sided weakness and pain affecting mobility. CIR recommended for follow up therapy.    Review of Systems  HENT: Positive for hearing loss. Negative for ear pain and tinnitus.   Eyes: Negative for blurred vision and double vision.  Respiratory: Negative for cough and shortness of breath.   Cardiovascular: Negative for chest pain.  Gastrointestinal: Negative for constipation, heartburn and nausea.  Genitourinary: Positive for urgency. Negative for dysuria.  Musculoskeletal: Positive for joint pain (right > left knee) and neck pain (right neck/shoulder pain).  Neurological: Positive for focal weakness and weakness. Negative for dizziness and sensory change.  Psychiatric/Behavioral: The patient is nervous/anxious.        Has disorientation at nights/early am per  wife.   All other systems reviewed and are negative.     Past Medical History:  Diagnosis Date  . Arthritis    "pretty much all over"   . Basal cell carcinoma    "several burned off his body, face, head"  . BPH (benign prostatic hypertrophy)   . Coronary artery disease    a. s/p PCI of RCA in 2006  . CVA (cerebral infarction)    a. 06/2015: left thalamic and bilateral PCA  . GERD (gastroesophageal reflux disease)   . Hyperlipidemia   . Hypertension   . TIA (transient ischemic attack)    Approximately 6 weeks post-cardiac catheterization.   . Type II diabetes mellitus ([place])    "prediabetic; lost alot of weight; not diabetic now" (06/16/2015)    Past Surgical History:  Procedure Laterality Date  . CARDIOVASCULAR STRESS TEST  07/01/2007   EF 74%  . CATARACT EXTRACTION, BILATERAL    . CORONARY ANGIOPLASTY WITH STENT PLACEMENT  10/2004   stenting x 2 to RCA  . FEMUR IM NAIL Right 11/26/2015   Procedure: INTRAMEDULLARY RIGHT (IM) NAIL FEMORAL;  Surgeon: Rod Can, MD;  Location: [facility];  Service: Orthopedics;  Laterality: Right;  . HERNIA REPAIR    . HIP ARTHROPLASTY  03/09/2011   Procedure: ARTHROPLASTY BIPOLAR HIP;  Surgeon: Mauri Pole;  Location: [facility];  Service: Orthopedics;  Laterality: Left;  . LAPAROSCOPIC INCISIONAL / UMBILICAL / VENTRAL HERNIA REPAIR     "below his naval"    Family History  Problem Relation Age of Onset  . Hypertension Mother   . Lung cancer Father   .  Lung cancer Brother      Social History:   Married. 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 he reports that he quit smoking about 45 years ago. He has never used smokeless tobacco. He reports that he drinks 1/2 can beer and 1/2 glass wine daily.  He reports that he does not use drugs.    Allergies: No Known Allergies    Medications Prior to Admission  Medication Sig Dispense Refill  . acetaminophen (TYLENOL) 500 MG tablet Take 1,000 mg by  mouth every 6 (six) hours as needed for mild pain.    [name] aspirin EC 81 MG tablet Take 81 mg by mouth daily.    [name] atorvastatin (LIPITOR) 80 MG tablet Take 1 tablet (80 mg total) by mouth daily. 30 tablet 0  . clopidogrel (PLAVIX) 75 MG tablet Take 1 tablet by mouth daily.    . metoprolol tartrate (LOPRESSOR) 25 MG tablet Take 1 tablet (25 mg total) by mouth 2 (two) times daily. (Patient taking differently: Take 12.5 mg by mouth 2 (two) times daily. ) 60 tablet 0  . Multiple Vitamin (MULTIVITAMIN) tablet Take 1 tablet by mouth daily.     [name] MYRBETRIQ 50 MG TB24 tablet Take 1 tablet by mouth daily.    . nitroGLYCERIN (NITROSTAT) 0.4 MG SL tablet Place 0.4 mg under the tongue every 5 (five) minutes as needed for chest pain.    . tamsulosin (FLOMAX) 0.4 MG CAPS capsule Take 0.4 mg by mouth every other day.     . citalopram (CELEXA) 10 MG tablet Take 1 tablet (10 mg total) by mouth daily. 30 tablet 0    Home: Home Living Family/patient expects to be discharged to:: Private residence Living Arrangements: Spouse/significant other Available Help at Discharge: Family, Available 24 hours/day Type of Home: House Home Access: Ramped entrance Home Layout: One level Bathroom Shower/Tub: Walk-in shower (grab bar in shower; was using shower seat) Bathroom Toilet: Handicapped height Bathroom Accessibility: Yes Home Equipment: Environmental consultant - 2 wheels, [place] - single point, Wheelchair - manual, Transport chair, Shower seat, Grab bars - tub/shower Additional Comments: Pt. was on CIR April- May 2017 following CVA  Lives With: Spouse   Functional History: Prior Function Level of Independence: Independent with assistive device(s) Comments: ambulates with RW short distances, uses w/c   Functional Status:  Mobility: Bed Mobility Overal bed mobility: Needs Assistance Bed Mobility: Rolling, Sidelying to Sit Rolling: Supervision Sidelying to sit: Min assist Supine to sit: Max assist, +2 for physical assistance Sit  to supine: +2 for physical assistance, Mod assist, Max assist General bed mobility comments: pt able to roll without assistance. Min A + B UE use of rail to elevate trunk into upright position to get to EOB. Pt limited due to pain in R shoulder and decreased motion in R knee. Cuing required to scoot forward at EOB. Transfers Overall transfer level: Needs assistance Equipment used: Rolling walker (2 wheeled) Transfer via Lift Equipment: [place] Transfers: Sit to/from Stand, [employer] Transfers Sit to Stand: Mod assist, +2 physical assistance General transfer comment: mod A +2 to power up from EOB into standing with cuing for hand placement.   Ambulation/Gait Ambulation/Gait assistance: Max assist, +2 physical assistance Ambulation Distance (Feet): 5 Feet Assistive device: Rolling walker (2 wheeled) Gait Pattern/deviations: Decreased stance time - right, Decreased weight shift to right, Trunk flexed, Narrow base of support, Antalgic, Decreased dorsiflexion - right, Step-to pattern General Gait Details: steps to chair; pt  very limited due to fatigue and decreased ROM in R knee. Pt required cuing for upright posture and weight shifting to R side. Max A +2 for safety  Gait velocity: slow    ADL: ADL Overall ADL's : Needs assistance/impaired Eating/Feeding: Set up, Sitting Grooming: Oral care, Sitting, Minimal assistance, Wash/dry hands Grooming Details (indicate cue type and reason): Sitting on Stedy; Pt fatigued and unable to tolerate standing for oral care, but did stand briefly to wash his hands requiring single UE support. Upper Body Bathing: Minimal assistance, Sitting Lower Body Bathing: Sit to/from stand, Maximal assistance Upper Body Dressing : Minimal assistance, Sitting Lower Body Dressing: Sit to/from stand, Maximal assistance Toilet Transfer: Maximal assistance, +2 for safety/equipment, +2 for physical assistance, [facility] Toilet Transfer Details (indicate cue type and reason): With  [facility] for simulated transfer. Only able to tolerate standing for approximately 45-60 seconds with max assist +2. Toileting- Clothing Manipulation and Hygiene: Maximal assistance, Sit to/from stand General ADL Comments: Pt with decreased activity tolerance for ADL on evaluation. Unable to stand for longer than 1 minute this session for tasks at sink.  Cognition: Cognition Overall Cognitive Status: Within Functional Limits for tasks assessed Arousal/Alertness: Awake/alert Orientation Level: Oriented to person, Oriented to place, Oriented to time, Oriented to situation, Oriented X4 Memory: Impaired Memory Impairment: Retrieval deficit Behaviors: Perseveration Cognition Arousal/Alertness: Awake/alert Behavior During Therapy: WFL for tasks assessed/performed Overall Cognitive Status: Within Functional Limits for tasks assessed General Comments: Pt follows commands well and with good awareness of safety and deficits. Per wife's report, pt is at baseline cognitively. Difficult to assess due to: Hard of hearing/deaf   Blood pressure 128/73, pulse (!) 58, temperature 97.9 F (36.6 C), temperature source Oral, resp. rate 18, height _0  (1.702 m), weight 74.5 kg (164 lb 3.9 oz), SpO2 99 %. Physical Exam  Nursing note and vitals reviewed. Constitutional: He is oriented to person, place, and time. He appears well-developed and well-nourished. He is sleeping. He is easily aroused.  Thin elderly male with flexed posture. He was unable to extend neck without cues. HOH.   HENT:  Head: Normocephalic and atraumatic.  Mouth/Throat: Oropharynx is clear and moist.  Eyes: Conjunctivae are normal. Pupils are equal, round, and reactive to light. Right eye exhibits no discharge. Left eye exhibits no discharge.  Neck: Normal range of motion. Neck supple.  Cardiovascular: Normal rate and regular rhythm.   Respiratory: Effort normal. No respiratory distress. He has no wheezes.  GI: Soft. Bowel sounds are  normal. He exhibits no distension. There is no tenderness.  Musculoskeletal: He exhibits no edema or tenderness.  Degenerative changes bilateral knees.   Neurological: He is alert, oriented to person, place, and time and easily aroused.  Pleasant and appropriate once alert.  HOH.  Soft voice.  Able to answer orientation questions without difficulty. Able to follow basic one and two step commands.  Motor: RUE: 4-4+/5 proximal to distal LUE: 4+/5 proximal to distal RLE: HF, KE 2/5, ADF/PF 3/5 LLE: 4+-5/5 proxima to distal  Skin: Skin is warm and dry.  Psychiatric: He has a normal mood and affect. His behavior is normal.    Results for orders placed or performed during the hospital encounter of 03/30/16 (from the past 48 hour(s))  Glucose, capillary     Status: Abnormal   Collection Time: 04/02/16  4:24 PM  Result Value Ref Range   Glucose-Capillary 120 (H) 65 - 99 mg/dL  Basic metabolic panel     Status: Abnormal   Collection  Time: 04/03/16  3:18 AM  Result Value Ref Range   Sodium 129 (L) 135 - 145 mmol/L   Potassium 3.6 3.5 - 5.1 mmol/L   Chloride 98 (L) 101 - 111 mmol/L   CO2 22 22 - 32 mmol/L   Glucose, Bld 107 (H) 65 - 99 mg/dL   BUN 10 6 - 20 mg/dL   Creatinine, Ser 0.80 0.61 - 1.24 mg/dL   Calcium 8.4 (L) 8.9 - 10.3 mg/dL   GFR calc non Af Amer >60 >60 mL/min   GFR calc Af Amer >60 >60 mL/min    Comment: (NOTE) The eGFR has been calculated using the CKD EPI equation. This calculation has not been validated in all clinical situations. eGFR's persistently <60 mL/min signify possible Chronic Kidney Disease.    Anion gap 9 5 - 15  CBC     Status: Abnormal   Collection Time: 04/03/16  3:18 AM  Result Value Ref Range   WBC 10.1 4.0 - 10.5 K/uL   RBC 3.54 (L) 4.22 - 5.81 MIL/uL   Hemoglobin 10.8 (L) 13.0 - 17.0 g/dL   HCT 32.3 (L) 39.0 - 52.0 %   MCV 91.2 78.0 - 100.0 fL   MCH 30.5 26.0 - 34.0 pg   MCHC 33.4 30.0 - 36.0 g/dL   RDW 14.7 11.5 - 15.5 %   Platelets 305  150 - 400 K/uL  Iron and TIBC     Status: Abnormal   Collection Time: 04/03/16  3:18 AM  Result Value Ref Range   Iron 12 (L) 45 - 182 ug/dL   TIBC 209 (L) 250 - 450 ug/dL   Saturation Ratios 6 (L) 17.9 - 39.5 %   UIBC 197 ug/dL  Ferritin     Status: None   Collection Time: 04/03/16  3:18 AM  Result Value Ref Range   Ferritin 302 24 - 336 ng/mL  Basic metabolic panel     Status: Abnormal   Collection Time: 04/04/16  5:37 AM  Result Value Ref Range   Sodium 126 (L) 135 - 145 mmol/L   Potassium 3.7 3.5 - 5.1 mmol/L   Chloride 96 (L) 101 - 111 mmol/L   CO2 22 22 - 32 mmol/L   Glucose, Bld 106 (H) 65 - 99 mg/dL   BUN 11 6 - 20 mg/dL   Creatinine, Ser 0.93 0.61 - 1.24 mg/dL   Calcium 8.5 (L) 8.9 - 10.3 mg/dL   GFR calc non Af Amer >60 >60 mL/min   GFR calc Af Amer >60 >60 mL/min    Comment: (NOTE) The eGFR has been calculated using the CKD EPI equation. This calculation has not been validated in all clinical situations. eGFR's persistently <60 mL/min signify possible Chronic Kidney Disease.    Anion gap 8 5 - 15  CBC     Status: Abnormal   Collection Time: 04/04/16  5:37 AM  Result Value Ref Range   WBC 10.9 (H) 4.0 - 10.5 K/uL   RBC 3.60 (L) 4.22 - 5.81 MIL/uL   Hemoglobin 10.8 (L) 13.0 - 17.0 g/dL   HCT 32.5 (L) 39.0 - 52.0 %   MCV 90.3 78.0 - 100.0 fL   MCH 30.0 26.0 - 34.0 pg   MCHC 33.2 30.0 - 36.0 g/dL   RDW 14.6 11.5 - 15.5 %   Platelets 364 150 - 400 K/uL  Glucose, capillary     Status: None   Collection Time: 04/04/16  8:12 AM  Result Value Ref Range  Glucose-Capillary 96 65 - 99 mg/dL  Glucose, capillary     Status: None   Collection Time: 04/04/16 11:18 AM  Result Value Ref Range   Glucose-Capillary 97 65 - 99 mg/dL   No results found.     Medical Problem List and Plan: 1.  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 2.  DVT  Prophylaxis/Anticoagulation: Mechanical: Sequential compression devices, below knee Bilateral lower extremities--start Lovenox tomorrow.  3. Pain Management: tylenol prn. Voltaren gel for knee pain.  4. Mood: LCSW to follow for evaluation and support.  5. Neuropsych: This patient not fully capable of making decisions on his own behalf. 6. Skin/Wound Care: routine pressure relief measures 7. Fluids/Electrolytes/Nutrition: Monitor I/O. Check lytes in am.  8. PAF: In NSR today.  To start ASA on Feb 2nd. Eliquis once hemorrhagic transformation absorbed.  9. Prediabetes: blood sugars have improved with weight loss and now Hgb A1c-5.0.  10. Blood pressures: Avoid hypotension ---low dose BB resumed for rate control.   11. Chronic knee pain/history of right hip replacement: Add Voltaren gel for relief.  12. Hyponatremia: Question chronic--has been in 130-132 range in the past. He was started on SSRI early this month for anxiety issues. Will decrease dose and monitor.  13. Iron deficiency anemia: On iron supplement 14. Leukocytosis: Monitor for signs of infection. Increased confusion last night per wife--will check UA/UCS   Post Admission Physician Evaluation: 1. Preadmission assessment reviewed and changes made below. 2. Functional deficits secondary  to Left temporal lobe infarct with hemorrhagic transformation with history of left CVA. 3. Patient is admitted to receive collaborative, interdisciplinary care between the physiatrist, rehab nursing staff, and therapy team. 4. Patient's level of medical complexity and substantial therapy needs in context of that medical necessity cannot be provided at a lesser intensity of care such as a SNF. 5. Patient has experienced substantial functional loss from his/her baseline which was documented above under the "Functional History" and "Functional Status" headings.  Judging by the patient's diagnosis, physical exam, and functional history, the patient has  potential for functional progress which will result in measurable gains while on inpatient rehab.  These gains will be of substantial and practical use upon discharge  in facilitating mobility and self-care at the household level. 6. Physiatrist will provide 24 hour management of medical needs as well as oversight of the therapy plan/treatment and provide guidance as appropriate regarding the interaction of the two. 7. The Preadmission Screening has been reviewed and patient status is unchanged unless otherwise stated above. 8. 24 hour rehab nursing will assist with safety, disease management, medication administration and patient education  and help integrate therapy concepts, techniques,education, etc. 9. PT will assess and treat for/with: Lower extremity strength, range of motion, stamina, balance, functional mobility, safety, adaptive techniques and equipment, coping skills, pain control, education.  Goals are: Min A. 10. OT will assess and treat for/with: ADL's, functional mobility, safety, upper extremity strength, adaptive techniques and equipment, ego support, and community reintegration.   Goals are: Min A. Therapy may proceed with showering this patient. 11. SLP will assess and treat for/with: cognition.  Goals are: Mod I/Supervision. 12. Case Management and Social Worker will assess and treat for psychological issues and discharge planning. 27. Team conference will be held weekly to assess progress toward goals and to determine barriers to discharge. 14. Patient will receive at least 3 hours of therapy per day at least  5 days per week. 15. ELOS: 18-21 days.       16. Prognosis:  good  Delice Lesch, MD, [address] [place] 04/04/2016

## 2016-04-05 ENCOUNTER — Inpatient Hospital Stay (HOSPITAL_COMMUNITY): Payer: Medicare Other | Admitting: Occupational Therapy

## 2016-04-05 ENCOUNTER — Inpatient Hospital Stay (HOSPITAL_COMMUNITY): Payer: Medicare Other | Admitting: Physical Therapy

## 2016-04-05 ENCOUNTER — Inpatient Hospital Stay (HOSPITAL_COMMUNITY): Payer: Medicare Other | Admitting: Speech Pathology

## 2016-04-05 DIAGNOSIS — I63412 Cerebral infarction due to embolism of left middle cerebral artery: Secondary | ICD-10-CM

## 2016-04-05 DIAGNOSIS — I619 Nontraumatic intracerebral hemorrhage, unspecified: Secondary | ICD-10-CM

## 2016-04-05 LAB — CBC WITH DIFFERENTIAL/PLATELET
Basophils Absolute: 0 10*3/uL (ref 0.0–0.1)
Basophils Relative: 0 %
Eosinophils Absolute: 0.1 10*3/uL (ref 0.0–0.7)
Eosinophils Relative: 1 %
HCT: 34.2 % — ABNORMAL LOW (ref 39.0–52.0)
Hemoglobin: 11.6 g/dL — ABNORMAL LOW (ref 13.0–17.0)
Lymphocytes Relative: 14 %
Lymphs Abs: 1.5 10*3/uL (ref 0.7–4.0)
MCH: 30.6 pg (ref 26.0–34.0)
MCHC: 33.9 g/dL (ref 30.0–36.0)
MCV: 90.2 fL (ref 78.0–100.0)
Monocytes Absolute: 1.1 10*3/uL — ABNORMAL HIGH (ref 0.1–1.0)
Monocytes Relative: 10 %
Neutro Abs: 8.6 10*3/uL — ABNORMAL HIGH (ref 1.7–7.7)
Neutrophils Relative %: 75 %
Platelets: 397 10*3/uL (ref 150–400)
RBC: 3.79 MIL/uL — ABNORMAL LOW (ref 4.22–5.81)
RDW: 14.8 % (ref 11.5–15.5)
WBC: 11.3 10*3/uL — ABNORMAL HIGH (ref 4.0–10.5)

## 2016-04-05 LAB — COMPREHENSIVE METABOLIC PANEL
ALT: 21 U/L (ref 17–63)
AST: 39 U/L (ref 15–41)
Albumin: 2.7 g/dL — ABNORMAL LOW (ref 3.5–5.0)
Alkaline Phosphatase: 66 U/L (ref 38–126)
Anion gap: 6 (ref 5–15)
BUN: 13 mg/dL (ref 6–20)
CO2: 22 mmol/L (ref 22–32)
Calcium: 8.6 mg/dL — ABNORMAL LOW (ref 8.9–10.3)
Chloride: 98 mmol/L — ABNORMAL LOW (ref 101–111)
Creatinine, Ser: 0.95 mg/dL (ref 0.61–1.24)
GFR calc Af Amer: >60 mL/min (ref >60)
GFR calc non Af Amer: >60 mL/min (ref >60)
Glucose, Bld: 110 mg/dL — ABNORMAL HIGH (ref 65–99)
Potassium: 3.8 mmol/L (ref 3.5–5.1)
Sodium: 126 mmol/L — ABNORMAL LOW (ref 135–145)
Total Bilirubin: 0.8 mg/dL (ref 0.3–1.2)
Total Protein: 6.1 g/dL — ABNORMAL LOW (ref 6.5–8.1)

## 2016-04-05 MED ORDER — CEPHALEXIN 250 MG PO CAPS
250.0000 mg | ORAL_CAPSULE | Freq: Three times a day (TID) | ORAL | Status: DC
  Administered 2016-04-05 – 2016-04-07 (×6): 250 mg via ORAL
  Filled 2016-04-05 (×6): qty 1

## 2016-04-05 NOTE — Evaluation (Signed)
Speech Language Pathology Assessment and Plan  Patient Details  Name: Herbert Marquez MRN: [id number] Date of Birth: 05-26-1927  SLP Diagnosis: Aphasia;Cognitive Impairments  Rehab Potential: Good ELOS: 2 to 2.5 weeks    Today's Date: 04/05/2016 SLP Individual Time: 0915-1005 SLP Individual Time Calculation (min): 50 min   Problem List:  Patient Active Problem List   Diagnosis Date Noted  . Iron deficiency anemia 04/04/2016  . Stroke (cerebrum) ([place]) 04/04/2016  . Prediabetes   . Chronic pain of right knee   . Cerebral hemorrhage (HCC) w/ SDH s/p IV tPA   . Coronary artery disease involving native coronary artery of native heart without angina pectoris   . Paroxysmal atrial fibrillation with RVR ([place])   . Labile blood pressure   . Hypotension   . Controlled type 2 DM with peripheral circulatory disorder ([place])   . History of right hip replacement   . Acute blood loss anemia   . Acute ischemic stroke ([place]) - L temporal lobe s/p tPA 03/30/2016  . Persistent atrial fibrillation ([place])   . Displaced intertrochanteric fracture of right femur, initial encounter for closed fracture ([place]) 11/26/2015  . Protein-calorie malnutrition, severe 11/26/2015  . Closed right hip fracture ([place]) 11/25/2015  . BPH (benign prostatic hyperplasia) 11/25/2015  . GERD (gastroesophageal reflux disease) 11/25/2015  . CKD (chronic kidney disease), stage II 11/25/2015  . Closed left hip fracture ([place]) 11/25/2015  . Citrobacter infection   . Controlled diabetes mellitus type 2 with complications ([place])   . Urinary frequency   . Sepsis ([place]) 11/05/2015  . Overactive bladder   . Sepsis, unspecified organism ([place]) 11/04/2015  . H/O: CVA (cerebrovascular accident) 11/04/2015  . Palpitations 09/18/2015  . Cerebrovascular accident (CVA) due to bilateral occlusion of posterior cerebral arteries ([place]) 09/18/2015  . DM type 2 with diabetic peripheral neuropathy ([place])   . Gait disturbance, post-stroke 06/23/2015   . 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 Alterations of sensations following CVA (cerebrovascular accident) 06/23/2015  . Thalamic infarction ([place]) 06/21/2015  . Sinus tachycardia 06/20/2015  . Benign essential HTN   . Type 2 diabetes mellitus with complication, without long-term current use of insulin ([place])   . CAD in native artery   . Dysphagia as late effect of cerebrovascular disease   . Leukocytosis   . Hypokalemia   . Hyponatremia   . AKI (acute kidney injury) ([place])   . PSVT (paroxysmal supraventricular tachycardia) ([place])   . HLD (hyperlipidemia)   . Syncope 06/16/2015  . UTI (lower urinary tract infection) 08/31/2011  . Fracture of femoral neck, left ([place]) 03/09/2011  . Obesity 03/09/2011  . COPD (chronic obstructive pulmonary disease) ([place]) 03/09/2011   Past Medical History:  Past Medical History:  Diagnosis Date  . Arthritis    "pretty much all over"   . Basal cell carcinoma    "several burned off his body, face, head"  . BPH (benign prostatic hypertrophy)   . Coronary artery disease    a. s/p PCI of RCA in 2006  . CVA (cerebral infarction)    a. 06/2015: left thalamic and bilateral PCA  . GERD (gastroesophageal reflux disease)   . Hyperlipidemia   . Hypertension   . TIA (transient ischemic attack)    Approximately 6 weeks post-cardiac catheterization.   . Type II diabetes mellitus ([place])    "prediabetic; lost alot of weight; not diabetic now" (06/16/2015)   Past Surgical History:  Past Surgical History:  Procedure Laterality Date  . CARDIOVASCULAR STRESS  TEST  07/01/2007   EF 74%  . CATARACT EXTRACTION, BILATERAL    . CORONARY ANGIOPLASTY WITH STENT PLACEMENT  10/2004   stenting x 2 to RCA  . FEMUR IM NAIL Right 11/26/2015   Procedure: INTRAMEDULLARY RIGHT (IM) NAIL FEMORAL;  Surgeon: Rod Can, MD;  Location: [facility];  Service: Orthopedics;  Laterality: Right;  . HERNIA REPAIR    . HIP ARTHROPLASTY  03/09/2011   Procedure: ARTHROPLASTY BIPOLAR HIP;   Surgeon: Mauri Pole;  Location: [facility];  Service: Orthopedics;  Laterality: Left;  . LAPAROSCOPIC INCISIONAL / UMBILICAL / VENTRAL HERNIA REPAIR     "below his naval"    Assessment / Plan / Recommendation Clinical Impression Herbert Mcenery Jonesis a 83 y.o.right handed malewith history of CAD s/p stent, PAF, HTN, T2DM, left thalamic infarct 9/17--on ASA/plavix PTA, RLE weakness post R-THR; who was admitted on 03/30/16 with difficulty speaking and confusion. History taken from chart review and wife. CT head without acute abnormality and was treated with tPA. MRI brain reviewed, negative, but limited due to motion. Follow up CTA head and neck done revealing left temporal lobe hemorrhagic infarct with subdural extension in middle cranial fossa, 60-70% R-ICA stenosis and 40-50% L-ICA stenosis. 2D echo with EF 65- 70% with mild LVH. Herbert Marquez felt that stroke embolic due to A fib with hemorrhagic conversion. He recommends "considering starting ASA day 7 post stroke and then anticoagulation once blood absorbed." He had Afib with HR in 130s that has improved with addition of low dose BB. Verbal output improving with minimal word finding deficits. Patient with resultant expressive deficits, right sided weakness and pain affecting mobility. CIR recommended for follow up therapy.   Pt admited to CIR on 04/04/16 with speech-langauge evaluation completed on 04/05/16. Pt presents with moderate to severe cognitive deficts complicated by mild expressive aphasia. Pt with deficits in the areas of word finding deficits c/b semantic paraphasias. No apraxia/groping was evident during this session. Although pt as some chronic mild cognitive deficits in 07/2016 when he discharged from CIR, pt with worsened acute deficits. Deficits impact sustained attention, memory, and basic problem solving. Pt would benefit from skiled ST while inpatient in order to maximize functional indepednence for spoken language and cognitive function  and to reduce caregiver burden. Anticipate that pt will continue to require 24/7 supervision  and home health ST.    Skilled Therapeutic Interventions          Speech-Language evaluation completed, see above. Results shared with wife, who was present for evaluation. Wife provides that pt was supervision at home. Pt currently requires Max A multimodal cues for basic problem solving, Mod A verbal cues for word finding d/t semantic paraphasia, Max A for sustain attention and Max A for orientation information. Wife agreeable to skilled ST services and POC.   SLP Assessment  Patient will need skilled Speech Lanaguage Pathology Services during CIR admission    Recommendations  SLP Diet Recommendations: Age appropriate regular solids;Thin (Wife states that pt occasionally has mild residue on right buccal area but manages without incident) Liquid Administration via: Cup;Straw Medication Administration: Whole meds with liquid Supervision: Patient able to self feed Compensations: Minimize environmental distractions Postural Changes and/or Swallow Maneuvers: Seated upright 90 degrees Oral Care Recommendations: Oral care BID Patient destination: Home Follow up Recommendations: Home Health SLP Equipment Recommended: None recommended by SLP    SLP Frequency 3 to 5 out of 7 days   SLP Duration  SLP Intensity  SLP Treatment/Interventions 2 to  2.5 weeks  Minumum of 1-2 x/day, 30 to 90 minutes  Cognitive remediation/compensation;Cueing hierarchy;Environmental controls;Therapeutic Activities;Patient/family education;Functional tasks;Speech/Language facilitation    Pain    Prior Functioning Cognitive/Linguistic Baseline: Baseline deficits Baseline deficit details: 07/08/15 MOCA Blind score of 17/22 (n>/=18); pt was supervision at home Type of Home: House  Lives With: Spouse Available Help at Discharge: Family;Available 24 hours/day Vocation: Retired  Function:   Cognition Comprehension  Comprehension assist level: Understands basic 50 - 74% of the time/ requires cueing 25 - 49% of the time  Expression   Expression assist level: Expresses basic 50 - 74% of the time/requires cueing 25 - 49% of the time. Needs to repeat parts of sentences.  Social Interaction Social Interaction assist level: Interacts appropriately 50 - 74% of the time - May be physically or verbally inappropriate.  Problem Solving Problem solving assist level: Solves basic 25 - 49% of the time - needs direction more than half the time to initiate, plan or complete simple activities  Memory Memory assist level: Recognizes or recalls 25 - 49% of the time/requires cueing 50 - 75% of the time   Short Term Goals: Week 1: SLP Short Term Goal 1 (Week 1): Pt will utilize word finding strategies at the structured phrase level with Min A verbal cues.  SLP Short Term Goal 2 (Week 1): Pt will utilize external memory aids to recall new, daily information with Mod A verbal cues.  SLP Short Term Goal 3 (Week 1): Pt will consistently demonstrate O x 4 with Mod A cues.  SLP Short Term Goal 4 (Week 1): Pt will sustain attention to basic, familiar task for ~ 1 minute intervals with Mod A verbal cues.  SLP Short Term Goal 5 (Week 1): Pt will complete basic, familiar tasks with Mod A verbal cues for functional problem solving.   Refer to Care Plan for Long Term Goals  Recommendations for other services: None   Discharge Criteria: Patient will be discharged from SLP if patient refuses treatment 3 consecutive times without medical reason, if treatment goals not met, if there is a change in medical status, if patient makes no progress towards goals or if patient is discharged from hospital.  The above assessment, treatment plan, treatment alternatives and goals were discussed and mutually agreed upon: by patient and by family  Sheritta Deeg B. Rutherford Nail, M.S., CCC-SLP Speech-Language Pathologist [facility] Piazza 04/05/2016, 12:32 PM

## 2016-04-05 NOTE — Progress Notes (Signed)
Social Work Assessment and Plan Social Work Assessment and Plan  Patient Details  Name: Herbert Marquez MRN: [id number] Date of Birth: 05-May-1927  Today's Date: 04/05/2016  Problem List:  Patient Active Problem List   Diagnosis Date Noted  . Iron deficiency anemia 04/04/2016  . Stroke (cerebrum) ([place]) 04/04/2016  . Prediabetes   . Chronic pain of right knee   . Cerebral hemorrhage (HCC) w/ SDH s/p IV tPA   . Coronary artery disease involving native coronary artery of native heart without angina pectoris   . Paroxysmal atrial fibrillation with RVR ([place])   . Labile blood pressure   . Hypotension   . Controlled type 2 DM with peripheral circulatory disorder ([place])   . History of right hip replacement   . Acute blood loss anemia   . Acute ischemic stroke ([place]) - L temporal lobe s/p tPA 03/30/2016  . Persistent atrial fibrillation ([place])   . Displaced intertrochanteric fracture of right femur, initial encounter for closed fracture ([place]) 11/26/2015  . Protein-calorie malnutrition, severe 11/26/2015  . Closed right hip fracture ([place]) 11/25/2015  . BPH (benign prostatic hyperplasia) 11/25/2015  . GERD (gastroesophageal reflux disease) 11/25/2015  . CKD (chronic kidney disease), stage II 11/25/2015  . Closed left hip fracture ([place]) 11/25/2015  . Citrobacter infection   . Controlled diabetes mellitus type 2 with complications ([place])   . Urinary frequency   . Sepsis ([place]) 11/05/2015  . Overactive bladder   . Sepsis, unspecified organism ([place]) 11/04/2015  . H/O: CVA (cerebrovascular accident) 11/04/2015  . Palpitations 09/18/2015  . Cerebrovascular accident (CVA) due to bilateral occlusion of posterior cerebral arteries ([place]) 09/18/2015  . DM type 2 with diabetic peripheral neuropathy ([place])   . Gait disturbance, post-stroke 06/23/2015  . 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 Alterations of sensations following CVA (cerebrovascular accident) 06/23/2015  . Thalamic infarction ([place])  06/21/2015  . Sinus tachycardia 06/20/2015  . Benign essential HTN   . Type 2 diabetes mellitus with complication, without long-term current use of insulin ([place])   . CAD in native artery   . Dysphagia as late effect of cerebrovascular disease   . Leukocytosis   . Hypokalemia   . Hyponatremia   . AKI (acute kidney injury) ([place])   . PSVT (paroxysmal supraventricular tachycardia) ([place])   . HLD (hyperlipidemia)   . Syncope 06/16/2015  . UTI (lower urinary tract infection) 08/31/2011  . Fracture of femoral neck, left ([place]) 03/09/2011  . Obesity 03/09/2011  . COPD (chronic obstructive pulmonary disease) ([place]) 03/09/2011   Past Medical History:  Past Medical History:  Diagnosis Date  . Arthritis    "pretty much all over"   . Basal cell carcinoma    "several burned off his body, face, head"  . BPH (benign prostatic hypertrophy)   . Coronary artery disease    a. s/p PCI of RCA in 2006  . CVA (cerebral infarction)    a. 06/2015: left thalamic and bilateral PCA  . GERD (gastroesophageal reflux disease)   . Hyperlipidemia   . Hypertension   . TIA (transient ischemic attack)    Approximately 6 weeks post-cardiac catheterization.   . Type II diabetes mellitus ([place])    "prediabetic; lost alot of weight; not diabetic now" (06/16/2015)   Past Surgical History:  Past Surgical History:  Procedure Laterality Date  . CARDIOVASCULAR STRESS TEST  07/01/2007   EF 74%  . CATARACT EXTRACTION, BILATERAL    . CORONARY ANGIOPLASTY WITH STENT PLACEMENT  10/2004  stenting x 2 to RCA  . FEMUR IM NAIL Right 11/26/2015   Procedure: INTRAMEDULLARY RIGHT (IM) NAIL FEMORAL;  Surgeon: Rod Can, MD;  Location[facility];  Service: Orthopedics;  Laterality: Right;  . HERNIA REPAIR    . HIP ARTHROPLASTY  03/09/2011   Procedure: ARTHROPLASTY BIPOLAR HIP;  Surgeon: Mauri Pole;  Location: [facility];  Service: Orthopedics;  Laterality: Left;  . LAPAROSCOPIC INCISIONAL / UMBILICAL / VENTRAL HERNIA REPAIR      "below his naval"   Social History:  reports that he quit smoking about 45 years ago. He has never used smokeless tobacco. He reports that he drinks about 4.8 oz of alcohol per week . He reports that he does not use drugs.  Family / Support Systems Marital Status: Married Patient Roles: Spouse, Parent Spouse/Significant Other: Sandy [phone]-home  [phone]-cell Children: Lisa-daughter [phone]-cell Other Supports: Another daughter and son who are supportive Anticipated Caregiver: Wife Ability/Limitations of Caregiver: Wife can not provide much physical care, hoping he will do well and get the a level she can manage him Caregiver Availability: 24/7 Family Dynamics: Close knit family all three try to assist their parents with care. Last admit pt left non-ambulatory with wife but could with daughter's present due to amount of care he required. Wife is hoping he will do well again. Has support from church members also.  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   Abuse/Neglect Physical Abuse: Denies Verbal Abuse: Denies Sexual Abuse: Denies Exploitation of patient/patient's resources: Denies Self-Neglect: Denies  Emotional Status Pt's affect, behavior adn adjustment status: Pt is motivated to be here, eneds to work on his sleep cycles and get back on track. He is somewhat confused in the am, according to his wife. he was doing well at home prior to admission-ambulating with a walker. Will see in a few days how he adjusts to this unit. Recent Psychosocial Issues: other health issues-past CVA and R-THR in 11/2015 after a fall. Pyschiatric History:  history of anxiety takes meds for this and wife feels it helps him. Will see if would benefit from neuro-psych seeing while here. Will need to clear cognitively and be less confused, hopefully due to change in sleep and moving units. Substance Abuse History: No issues  Patient / Family Perceptions, Expectations & Goals Pt/Family understanding of illness & functional limitations: Wife can explain his stroke and deficits. She does talk with the MD and feels she has a good understanding of his condtion and deficts. She plans to be here daily and talk with MD and observe in therapies. Premorbid pt/family roles/activities: Husband, father, grand-great grandfather, retiree, church member, etc Anticipated changes in roles/activities/participation: resume Pt/family expectations/goals: Wife states: " I hope he does well enough I can take him home. I can only do so much physically for him."  Pt was sleeping and awoke and looked at [employer] assess later  [employer]: Other (Comment) (was going to OP prior to admission) Premorbid Home Care/DME Agencies: Other (Comment) (had HH and OP from past CVA) Transportation available at discharge: Wife Resource referrals recommended: Neuropsychology, Support group (specify)  Discharge Planning Living Arrangements: Spouse/significant other Support Systems: Spouse/significant other, Children, Friends/neighbors, Church/faith community Type of Residence: Private  residence Insurance underwriter Resources: [employer], Multimedia programmer (specify) Sports administrator) Financial Resources: Radio broadcast assistant Screen Referred: No Living Expenses: Own Money Management: Spouse Does the patient have any problems obtaining your medications?: No Home Management: Wife does the home management Patient/Family Preliminary Plans: return home with wife if she can manage him. it will depend upon his progress here, their children have been assisting but have jobs and their own  families too. Pt did go home at a wheelchair level last time and was not ambulatory with wife. Social Work Anticipated Follow Up Needs: HH/OP, Support Group  Clinical Impression Pleasant somewhat confused gentleman who has been here before-last May. He did do well and went home with his wife, who was here daily and supported him. Will await team's evaluations and see if can get to a level where Wife can manage him at home. Will provide support to pt and wife and work on a realistic discharge plan. Very supportive family. Pt may benefit from neuro-psych later in his stay if clears cognitively.  Elease Hashimoto 04/05/2016, 11:33 AM

## 2016-04-05 NOTE — Evaluation (Signed)
Physical Therapy Assessment and Plan  Patient Details  Name: Herbert Marquez MRN: [id number] Date of Birth: 13-Oct-1927  PT Diagnosis: Abnormal posture, Abnormality of gait, Cognitive deficits, Hemiplegia dominant, Hypotonia, Impaired cognition, Impaired sensation and Muscle weakness Rehab Potential: Good ELOS: 18-21days    Today's Date: 04/05/2016 PT Individual Time: 4098-1191 PT Individual Time Calculation (min): 60 min    Problem List:  Patient Active Problem List   Diagnosis Date Noted  . Iron deficiency anemia 04/04/2016  . Stroke (cerebrum) ([place]) 04/04/2016  . Prediabetes   . Chronic pain of right knee   . Cerebral hemorrhage (HCC) w/ SDH s/p IV tPA   . Coronary artery disease involving native coronary artery of native heart without angina pectoris   . Paroxysmal atrial fibrillation with RVR ([place])   . Labile blood pressure   . Hypotension   . Controlled type 2 DM with peripheral circulatory disorder ([place])   . History of right hip replacement   . Acute blood loss anemia   . Acute ischemic stroke ([place]) - L temporal lobe s/p tPA 03/30/2016  . Persistent atrial fibrillation ([place])   . Displaced intertrochanteric fracture of right femur, initial encounter for closed fracture ([place]) 11/26/2015  . Protein-calorie malnutrition, severe 11/26/2015  . Closed right hip fracture ([place]) 11/25/2015  . BPH (benign prostatic hyperplasia) 11/25/2015  . GERD (gastroesophageal reflux disease) 11/25/2015  . CKD (chronic kidney disease), stage II 11/25/2015  . Closed left hip fracture ([place]) 11/25/2015  . Citrobacter infection   . Controlled diabetes mellitus type 2 with complications ([place])   . Urinary frequency   . Sepsis ([place]) 11/05/2015  . Overactive bladder   . Sepsis, unspecified organism ([place]) 11/04/2015  . H/O: CVA (cerebrovascular accident) 11/04/2015  . Palpitations 09/18/2015  . Cerebrovascular accident (CVA) due to bilateral occlusion of posterior cerebral arteries ([place]) 09/18/2015   . DM type 2 with diabetic peripheral neuropathy ([place])   . Gait disturbance, post-stroke 06/23/2015  . 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 Alterations of sensations following CVA (cerebrovascular accident) 06/23/2015  . Thalamic infarction ([place]) 06/21/2015  . Sinus tachycardia 06/20/2015  . Benign essential HTN   . Type 2 diabetes mellitus with complication, without long-term current use of insulin ([place])   . CAD in native artery   . Dysphagia as late effect of cerebrovascular disease   . Leukocytosis   . Hypokalemia   . Hyponatremia   . AKI (acute kidney injury) ([place])   . PSVT (paroxysmal supraventricular tachycardia) ([place])   . HLD (hyperlipidemia)   . Syncope 06/16/2015  . UTI (lower urinary tract infection) 08/31/2011  . Fracture of femoral neck, left ([place]) 03/09/2011  . Obesity 03/09/2011  . COPD (chronic obstructive pulmonary disease) ([place]) 03/09/2011    Past Medical History:  Past Medical History:  Diagnosis Date  . Arthritis    "pretty much all over"   . Basal cell carcinoma    "several burned off his body, face, head"  . BPH (benign prostatic hypertrophy)   . Coronary artery disease    a. s/p PCI of RCA in 2006  . CVA (cerebral infarction)    a. 06/2015: left thalamic and bilateral PCA  . GERD (gastroesophageal reflux disease)   . Hyperlipidemia   . Hypertension   . TIA (transient ischemic attack)    Approximately 6 weeks post-cardiac catheterization.   . Type II diabetes mellitus ([place])    "prediabetic; lost alot of weight; not diabetic now" (06/16/2015)   Past Surgical History:  Past Surgical History:  Procedure Laterality Date  . CARDIOVASCULAR STRESS TEST  07/01/2007   EF 74%  . CATARACT EXTRACTION, BILATERAL    . CORONARY ANGIOPLASTY WITH STENT PLACEMENT  10/2004   stenting x 2 to RCA  . FEMUR IM NAIL Right 11/26/2015   Procedure: INTRAMEDULLARY RIGHT (IM) NAIL FEMORAL;  Surgeon: Rod Can, MD;  Location: [facility];  Service: Orthopedics;   Laterality: Right;  . HERNIA REPAIR    . HIP ARTHROPLASTY  03/09/2011   Procedure: ARTHROPLASTY BIPOLAR HIP;  Surgeon: Mauri Pole;  Location: [facility];  Service: Orthopedics;  Laterality: Left;  . LAPAROSCOPIC INCISIONAL / UMBILICAL / VENTRAL HERNIA REPAIR     "below his naval"    Assessment & Plan Clinical Impression: Patient is a 83 y.o.right handed malewith history of CAD s/p stent, PAF, HTN,  T2DM, left thalamic infarct 9/17--on ASA/plavix PTA, RLE weakness post R-THR; who was admitted on 03/30/16 with difficulty speaking and confusion. History taken from chart review and wife.  CT head without acute abnormality and was treated with tPA. MRI brain reviewed, negative, but limited due to motion.   Follow up  CTA head and neck done revealing left temporal lobe hemorrhagic infarct with subdural extension in middle cranial fossa, 60-70% R-ICA stenosis and 40-50% L-ICA stenosis.  2D echo with EF 65- 70% with mild LVH. Dr. Erlinda Hong felt that stroke embolic due to A fib with hemorrhagic conversion. He recommends "considering starting ASA day 7 post stroke and then anticoagulation once blood absorbed."  He had Afib with HR in 130s that has improved with addition of low dose BB. Verbal output improving with minimal word finding deficits.  Patient with resultant expressive deficits, right sided weakness and pain affecting mobility.  Patient transferred to CIR on 04/04/2016 .   Patient currently requires max with mobility secondary to muscle weakness and muscle joint tightness, impaired timing and sequencing, abnormal tone, unbalanced muscle activation and motor apraxia, decreased initiation, decreased attention, decreased awareness, decreased problem solving, decreased safety awareness, decreased memory and delayed processing and decreased sitting balance, decreased standing balance, decreased postural control, hemiplegia and decreased balance strategies.  Prior to hospitalization, patient was supervision with  mobility and lived with Spouse in a House home.  Home access is  Ramped entrance.  Patient will benefit from skilled PT intervention to maximize safe functional mobility, minimize fall risk and decrease caregiver burden for planned discharge home with 24 hour assist.  Anticipate patient will benefit from follow up [facility] at discharge.  PT - End of Session Activity Tolerance: Tolerates 10 - 20 min activity with multiple rests Endurance Deficit: Yes PT Assessment Rehab Potential (ACUTE/IP ONLY): Good Barriers to Discharge: Decreased caregiver support PT Patient demonstrates impairments in the following area(s): Behavior;Motor;Endurance;Safety;Sensory;Perception;Nutrition;Balance;Pain PT Transfers Functional Problem(s): Bed Mobility;Bed to Chair;Car;Furniture;Floor PT Locomotion Functional Problem(s): Ambulation;Wheelchair Mobility;Stairs PT Plan PT Intensity: Minimum of 1-2 x/day ,45 to 90 minutes PT Frequency: 5 out of 7 days PT Duration Estimated Length of Stay: 18-21days  PT Treatment/Interventions: Ambulation/gait training;Cognitive remediation/compensation;Community reintegration;Balance/vestibular training;Discharge planning;Disease management/prevention;DME/adaptive equipment instruction;Functional electrical stimulation;Functional mobility training;Neuromuscular re-education;Patient/family education;Pain management;Psychosocial support;Skin care/wound management;Splinting/orthotics;Stair training;Therapeutic Activities;Therapeutic Exercise;UE/LE Coordination activities;UE/LE Strength taining/ROM;Visual/perceptual remediation/compensation;Wheelchair propulsion/positioning PT Transfers Anticipated Outcome(s): Min assist with LRAD  PT Locomotion Anticipated Outcome(s): Supervision WC level and min assist for gait for household distances.  PT Recommendation Follow Up Recommendations: Home health PT Patient destination: Home Equipment Recommended: To be determined Equipment Details: pt has RW,  WC, transport chair    Skilled Therapeutic Intervention  Pt received sitting in WC and agreeable to PT. Pt's wife reports that patient received lung late and requested increased time to finish lunch. PT returned in 10 minutes and pt agreeable to PT. PT instructed patient in Evaluation and initiated treatment intervention; see below for results. PT educated patient in basic transfers with stedy and with squat pivot, bed mobility, gait in parallel bars, WC mobility, as well as POC, discharge recommendations, and rehab potential. Patient returned too room and left supine in bed with call bell in reach and all needs met.     PT Evaluation Precautions/Restrictions Precautions Precautions: Fall Restrictions Weight Bearing Restrictions: No General PT Amount of Missed Time (min): 15 Minutes PT Missed Treatment Reason: Other (Comment) (eating ) Vital Signs  Pain Pain Assessment Pain Assessment: No/denies pain Pain Score: 0-No pain Home Living/Prior Functioning Home Living Available Help at Discharge: Family;Available 24 hours/day Type of Home: House Home Access: Ramped entrance Home Layout: One level Bathroom Shower/Tub: Walk-in shower (grab bar in shower; was using shower seat) Bathroom Toilet: Handicapped height Bathroom Accessibility: Yes Additional Comments: Pt at CIR 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 after CVA  Lives With: Spouse Prior Function Level of Independence: Requires assistive device for independence;Needs assistance with ADLs Driving: No Vocation: Retired Comments: ambulates with RW short distances, uses w/c  Vision/Perception     Cognition Overall Cognitive Status: Impaired/Different from baseline Arousal/Alertness: Lethargic Orientation Level: Oriented to place;Oriented to person Attention: Sustained Sustained Attention: Impaired Sustained Attention Impairment: Verbal basic;Functional basic Memory: Impaired Memory Impairment: Retrieval deficit;Decreased recall of new  information Awareness:  (Unable to determine this session d/t fatigue) Problem Solving: Impaired Problem Solving Impairment: Verbal basic;Functional basic Executive Function: Reasoning;Sequencing;Organizing Reasoning: Impaired Reasoning Impairment: Verbal basic;Functional basic Sequencing: Impaired Sequencing Impairment: Verbal basic;Functional basic Organizing: Impaired Organizing Impairment: Verbal basic;Functional basic Safety/Judgment: Impaired Sensation Sensation Light Touch: Impaired by gross assessment (Distal BLE impaired L>R) Coordination Gross Motor Movements are Fluid and Coordinated: No Fine Motor Movements are Fluid and Coordinated: No Coordination and Movement Description: Flexor withdrawl of RLE  Heel Shin Test: unable to assess Motor  Motor Motor: Abnormal tone;Hemiplegia Motor - Skilled Clinical Observations: R sided weakness and and decreased coordination  Mobility Bed Mobility Bed Mobility: (P) Rolling Right;Rolling Left;Supine to Sit;Sit to Supine Rolling Right: (P) 3: Mod assist Rolling Right Details: (P) Verbal cues for technique;Verbal cues for precautions/safety;Tactile cues for placement Rolling Left: (P) 3: Mod assist Rolling Left Details: (P) Verbal cues for technique;Verbal cues for precautions/safety;Tactile cues for placement Supine to Sit: (P) 2: Max assist Supine to Sit Details: (P) Verbal cues for technique;Verbal cues for precautions/safety;Manual facilitation for placement;Tactile cues for placement Sit to Supine: (P) 3: Mod assist Sit to Supine - Details: (P) Verbal cues for technique;Verbal cues for sequencing;Verbal cues for precautions/safety;Manual facilitation for placement Transfers Transfers: (P) Yes Locomotion  Ambulation Ambulation: Yes Ambulation/Gait Assistance: 2: Max assist;3: Mod assist Ambulation Distance (Feet): 10 Feet Assistive device: Parallel bars Ambulation/Gait Assistance Details: Tactile cues for posture;Tactile  cues for placement;Verbal cues for technique;Verbal cues for precautions/safety;Manual facilitation for weight shifting;Manual facilitation for placement Gait Gait: Yes Gait Pattern: Antalgic;Right foot flat;Poor foot clearance - right Stairs / Additional Locomotion Stairs: No Wheelchair Mobility Wheelchair Mobility: Yes Wheelchair Assistance: 5: Investment banker, operational Details: Verbal cues for technique;Verbal cues for Information systems manager: Both upper extremities Wheelchair Parts Management: Needs assistance Distance: 147f   Trunk/Postural Assessment  Cervical Assessment Cervical Assessment: Exceptions to [facility](cerv[facility] prot[facility]) Thoracic Assessment Thoracic Assessment: [facility](incre[facility]phosis) Lumbar Assessment  Lumbar Assessment: Exceptions to [facility] (posterior pelvic tilt and flat lumar spine) Postural Control Postural Control: Deficits on evaluation (delays)  Balance Balance Balance Assessed: Yes Static Sitting Balance Static Sitting - Level of Assistance: 6: Modified independent (Device/Increase time) Dynamic Sitting Balance Dynamic Sitting - Level of Assistance: 5: Stand by assistance Static Standing Balance Static Standing - Balance Support: Bilateral upper extremity supported Static Standing - Level of Assistance: 3: Mod assist Dynamic Standing Balance Dynamic Standing - Balance Support: Bilateral upper extremity supported Dynamic Standing - Level of Assistance: 2: Max assist Dynamic Standing - Balance Activities: Lateral lean/weight shifting Extremity Assessment      RLE Assessment RLE Assessment: Exceptions to [facility] RLE AROM (degrees) RLE Overall AROM Comments: lacking 35%full knee extension and 25% hip flexion  RLE PROM (degrees) RLE Overall PROM Comments: lackign 10% full extension with Flexor tone noted when stretched to end range RLE Strength RLE Overall Strength Comments: grossly 3+/5 with pain in knee for all  motions LLE Assessment LLE Assessment: Within Functional Limits   See Function Navigator for Current Functional Status.   Refer to Care Plan for Long Term Goals  Recommendations for other services: None   Discharge Criteria: Patient will be discharged from PT if patient refuses treatment 3 consecutive times without medical reason, if treatment goals not met, if there is a change in medical status, if patient makes no progress towards goals or if patient is discharged from hospital.  The above assessment, treatment plan, treatment alternatives and goals were discussed and mutually agreed upon: by patient  Lorie Phenix 04/05/2016, 3:47 PM

## 2016-04-05 NOTE — Evaluation (Signed)
Occupational Therapy Assessment and Plan  Patient Details  Name: Herbert Marquez MRN: [id number] Date of Birth: August 07, 1927  OT Diagnosis: abnormal posture, acute pain, cognitive deficits, hemiplegia affecting dominant side, muscle weakness (generalized) and pain in joint Rehab Potential: Rehab Potential (ACUTE ONLY): Good ELOS: 14-16 days   Today's Date: 04/05/2016 OT Individual Time: 6333-5456 OT Individual Time Calculation (min): 59 min     Problem List:  Patient Active Problem List   Diagnosis Date Noted  . Iron deficiency anemia 04/04/2016  . Stroke (cerebrum) ([place]) 04/04/2016  . Prediabetes   . Chronic pain of right knee   . Cerebral hemorrhage (HCC) w/ SDH s/p IV tPA   . Coronary artery disease involving native coronary artery of native heart without angina pectoris   . Paroxysmal atrial fibrillation with RVR ([place])   . Labile blood pressure   . Hypotension   . Controlled type 2 DM with peripheral circulatory disorder ([place])   . History of right hip replacement   . Acute blood loss anemia   . Acute ischemic stroke ([place]) - L temporal lobe s/p tPA 03/30/2016  . Persistent atrial fibrillation ([place])   . Displaced intertrochanteric fracture of right femur, initial encounter for closed fracture ([place]) 11/26/2015  . Protein-calorie malnutrition, severe 11/26/2015  . Closed right hip fracture ([place]) 11/25/2015  . BPH (benign prostatic hyperplasia) 11/25/2015  . GERD (gastroesophageal reflux disease) 11/25/2015  . CKD (chronic kidney disease), stage II 11/25/2015  . Closed left hip fracture ([place]) 11/25/2015  . Citrobacter infection   . Controlled diabetes mellitus type 2 with complications ([place])   . Urinary frequency   . Sepsis ([place]) 11/05/2015  . Overactive bladder   . Sepsis, unspecified organism ([place]) 11/04/2015  . H/O: CVA (cerebrovascular accident) 11/04/2015  . Palpitations 09/18/2015  . Cerebrovascular accident (CVA) due to bilateral occlusion of posterior cerebral arteries  ([place]) 09/18/2015  . DM type 2 with diabetic peripheral neuropathy ([place])   . Gait disturbance, post-stroke 06/23/2015  . 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 Alterations of sensations following CVA (cerebrovascular accident) 06/23/2015  . Thalamic infarction ([place]) 06/21/2015  . Sinus tachycardia 06/20/2015  . Benign essential HTN   . Type 2 diabetes mellitus with complication, without long-term current use of insulin ([place])   . CAD in native artery   . Dysphagia as late effect of cerebrovascular disease   . Leukocytosis   . Hypokalemia   . Hyponatremia   . AKI (acute kidney injury) ([place])   . PSVT (paroxysmal supraventricular tachycardia) ([place])   . HLD (hyperlipidemia)   . Syncope 06/16/2015  . UTI (lower urinary tract infection) 08/31/2011  . Fracture of femoral neck, left ([place]) 03/09/2011  . Obesity 03/09/2011  . COPD (chronic obstructive pulmonary disease) ([place]) 03/09/2011    Past Medical History:  Past Medical History:  Diagnosis Date  . Arthritis    "pretty much all over"   . Basal cell carcinoma    "several burned off his body, face, head"  . BPH (benign prostatic hypertrophy)   . Coronary artery disease    a. s/p PCI of RCA in 2006  . CVA (cerebral infarction)    a. 06/2015: left thalamic and bilateral PCA  . GERD (gastroesophageal reflux disease)   . Hyperlipidemia   . Hypertension   . TIA (transient ischemic attack)    Approximately 6 weeks post-cardiac catheterization.   . Type II diabetes mellitus ([place])    "prediabetic; lost alot of weight; not diabetic now" (06/16/2015)  Past Surgical History:  Past Surgical History:  Procedure Laterality Date  . CARDIOVASCULAR STRESS TEST  07/01/2007   EF 74%  . CATARACT EXTRACTION, BILATERAL    . CORONARY ANGIOPLASTY WITH STENT PLACEMENT  10/2004   stenting x 2 to RCA  . FEMUR IM NAIL Right 11/26/2015   Procedure: INTRAMEDULLARY RIGHT (IM) NAIL FEMORAL;  Surgeon: Rod Can, MD;  Location: [facility];  Service:  Orthopedics;  Laterality: Right;  . HERNIA REPAIR    . HIP ARTHROPLASTY  03/09/2011   Procedure: ARTHROPLASTY BIPOLAR HIP;  Surgeon: Mauri Pole;  Location: [facility];  Service: Orthopedics;  Laterality: Left;  . LAPAROSCOPIC INCISIONAL / UMBILICAL / VENTRAL HERNIA REPAIR     "below his naval"    Assessment & Plan Clinical Impression: Patient is a 83 y.o. year old male with recent admission to the hospital on 03/30/16 with difficulty speaking and confusion. History taken from chart review and wife.  CT head without acute abnormality and was treated with tPA. MRI brain reviewed, negative, but limited due to motion.   Follow up  CTA head and neck done revealing left temporal lobe hemorrhagic infarct with subdural extension in middle cranial fossa, 60-70% R-ICA stenosis and 40-50% L-ICA stenosis .  Patient transferred to CIR on 04/04/2016 .    Patient currently requires total with basic self-care skills secondary to muscle weakness, impaired timing and sequencing, unbalanced muscle activation, ataxia and decreased coordination, decreased awareness, decreased problem solving, decreased safety awareness, decreased memory and delayed processing and decreased sitting balance, decreased standing balance, decreased postural control, hemiplegia and decreased balance strategies.  Prior to hospitalization, patient could complete ADLs  with supervision.  Patient will benefit from skilled intervention to decrease level of assist with basic self-care skills and increase independence with basic self-care skills prior to discharge home with care partner.  Anticipate patient will require minimal physical assistance and follow up home health.  OT - End of Session Activity Tolerance: Decreased this session Endurance Deficit: Yes OT Assessment Rehab Potential (ACUTE ONLY): Good OT Patient demonstrates impairments in the following area(s): Balance;Cognition;Endurance;Safety;Pain;Motor OT Basic ADL's Functional  Problem(s): Eating;Grooming;Bathing;Dressing;Toileting OT Transfers Functional Problem(s): Tub/Shower;Toilet OT Additional Impairment(s): Fuctional Use of Upper Extremity OT Plan OT Intensity: Minimum of 1-2 x/day, 45 to 90 minutes OT Frequency: 5 out of 7 days OT Duration/Estimated Length of Stay: 14-16 days OT Treatment/Interventions: Balance/vestibular training;Cognitive remediation/compensation;Discharge planning;DME/adaptive equipment instruction;Disease mangement/prevention;Functional mobility training;Pain management;Neuromuscular re-education;Patient/family education;Self Care/advanced ADL retraining;Therapeutic Exercise;UE/LE Strength taining/ROM;Therapeutic Activities;UE/LE Coordination activities;Wheelchair propulsion/positioning OT Self Feeding Anticipated Outcome(s): modified independent OT Basic Self-Care Anticipated Outcome(s): min assist level OT Toileting Anticipated Outcome(s): min assist level OT Bathroom Transfers Anticipated Outcome(s): min assist level OT Recommendation Patient destination: Home Follow Up Recommendations: Home health OT;24 hour supervision/assistance Equipment Recommended: None recommended by OT   Skilled Therapeutic Intervention Pt began education on selfcare retraining sit to stand at the EOB.  Pt with increased posterior lean with dynamic sitting during ADL task. Increased posterior lean with sit to stand requiring total assist +2 (pt 30%).  Pt with slight ataxia use of the LUE during bathing and dressing tasks.  Noted dressing apraxia as well when attempting to donn shirt and pants.  Discussed ELOS and goal expectations with family at end of session.    OT Evaluation Precautions/Restrictions  Precautions Precautions: Fall Required Braces or Orthoses: Cervical Brace Cervical Brace: Soft collar (cervical collar for comfort only)  Pain  Pain in right knee but no number given  Home Living/Prior Functioning Home  Living Family/patient expects to  be discharged to:: Private residence Living Arrangements: Spouse/significant other Available Help at Discharge: Family, Available 24 hours/day Type of Home: House Home Access: [place]: One level Bathroom Shower/Tub: Multimedia programmer: Handicapped height Bathroom Accessibility: Yes Additional Comments: Pt at [employer] 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 after CVA  Lives With: Spouse IADL History Homemaking Responsibilities: No Occupation: Retired Prior Function Level of Independence: Requires assistive device for independence, Needs assistance with ADLs Driving: No Vocation: Retired Comments: ambulates with RW short distances, uses w/c  ADL   See Function Section of chart for details  Vision/Perception  Vision- History Baseline Vision/History: Wears glasses Wears Glasses: Reading only Patient Visual Report: No change from baseline Vision- Assessment Vision Assessment?: No apparent visual deficits Additional Comments: Will test further in treatment  Cognition Overall Cognitive Status: Impaired/Different from baseline Arousal/Alertness: Lethargic Orientation Level: Person;Situation Year:  (1970) Month: January Day of Week: Correct Memory: Impaired Memory Impairment: Storage deficit;Decreased short term memory Decreased Short Term Memory: Verbal basic Immediate Memory Recall: Sock;Blue Attention: Sustained Sustained Attention: Impaired Sustained Attention Impairment: Verbal complex;Functional basic Awareness: Impaired Awareness Impairment: Intellectual impairment Problem Solving: Impaired Problem Solving Impairment: Functional basic Executive Function: Reasoning;Sequencing;Organizing Reasoning: Impaired Reasoning Impairment: Verbal basic;Functional basic Sequencing: Impaired Sequencing Impairment: Verbal basic;Functional basic Organizing: Impaired Organizing Impairment: Verbal basic;Functional basic Safety/Judgment: Impaired Sensation Sensation Light  Touch: Appears Intact Stereognosis: Not tested Hot/Cold: Not tested Proprioception: Not tested Coordination Gross Motor Movements are Fluid and Coordinated: No Fine Motor Movements are Fluid and Coordinated: No Coordination and Movement Description: Ataxia noted in the LUE with functional use.  Pt only using 50% of ADL, relied on the LUE primarily for squeezing out washcloth.   Motor  Motor Motor: Hemiplegia;Abnormal postural alignment and control Motor - Skilled Clinical Observations: Ataxia and tremor noted in the RUE Mobility  Bed Mobility Bed Mobility: Supine to Sit;Rolling Right;Rolling Left Rolling Right: 4: Min assist;With rail Rolling Left: 4: Min assist;With rail Supine to Sit: 3: Mod assist;With rails Transfers Transfers: Sit to Stand;Stand to Sit Sit to Stand: 1: +2 Total assist;Without upper extremity assist;From bed Sit to Stand: Patient Percentage: 30% Stand to Sit: 1: +2 Total assist;With upper extremity assist;To chair/3-in-1  Trunk/Postural Assessment  Cervical Assessment Cervical Assessment: Exceptions to [facility] (cervical protraction) Thoracic Assessment Thoracic Assessment: Exceptions to [facility] (increased thoracic kyphosis) Lumbar Assessment Lumbar Assessment: Exceptions to [facility] (lumbar flexion present) Postural Control Postural Control: Deficits on evaluation Trunk Control: Pt with posterior lean in sitting and standing with LOB X1 during selfcare tasks.   Balance Balance Balance Assessed: Yes Static Sitting Balance Static Sitting - Balance Support: Feet supported Static Sitting - Level of Assistance: 5: Stand by assistance Dynamic Sitting Balance Dynamic Sitting - Balance Support: During functional activity Dynamic Sitting - Level of Assistance: 4: Min assist Static Standing Balance Static Standing - Balance Support: During functional activity;Bilateral upper extremity supported Static Standing - Level of Assistance: 1: +2 Total assist Dynamic Standing  Balance Dynamic Standing - Balance Support: Bilateral upper extremity supported Dynamic Standing - Level of Assistance: 1: +2 Total assist Extremity/Trunk Assessment RUE Assessment RUE Assessment: Exceptions to [facility] RUE Strength RUE Overall Strength Comments: Pt with AROM shoulder flexion 0-110 degrees.  Gross digit flexion and extension noted with slight ataxia and tremors noted.   LUE Assessment LUE Assessment: Within Functional Limits   See Function Navigator for Current Functional Status.   Refer to Care Plan for Long Term Goals  Recommendations for other services: None  Discharge Criteria: Patient will be discharged from OT if patient refuses treatment 3 consecutive times without medical reason, if treatment goals not met, if there is a change in medical status, if patient makes no progress towards goals or if patient is discharged from hospital.  The above assessment, treatment plan, treatment alternatives and goals were discussed and mutually agreed upon: by patient and by family  Randy Castrejon OTR/L 04/05/2016, 9:26 PM

## 2016-04-05 NOTE — Care Management Note (Signed)
[facility] Individual Statement of Services  Patient Name:  Herbert Marquez  Date:  04/05/2016  Welcome to the [facility].  Our goal is to provide you with an individualized program based on your diagnosis and situation, designed to meet your specific needs.  With this comprehensive rehabilitation program, you will be expected to participate in at least 3 hours of rehabilitation therapies Monday-Friday, with modified therapy programming on the weekends.  Your rehabilitation program will include the following services:  Physical Therapy (PT), Occupational Therapy (OT), Speech Therapy (ST), 24 hour per day rehabilitation nursing, Therapeutic Recreaction (TR), Neuropsychology, Case Management (Social Worker), Rehabilitation Medicine, Nutrition Services and Pharmacy Services  Weekly team conferences will be held on Wednesday to discuss your progress.  Your Social Worker will talk with you frequently to get your input and to update you on team discussions.  Team conferences with you and your family in attendance may also be held.  Expected length of stay: 18-21 days  Overall anticipated outcome: overall min assist level  Depending on your progress and recovery, your program may change. Your Social Worker will coordinate services and will keep you informed of any changes. Your Social Worker's name and contact numbers are listed  below.  The following services may also be recommended but are not provided by the [facility]:    [place] will be made to provide these services after discharge if needed.  Arrangements include referral to agencies that provide these services.  Your insurance has been verified to be:  [place] Your primary doctor is:  Lillard Anes  Pertinent information will be shared with your doctor and your insurance company.  Social  Worker:  Ovidio Kin, [place] or ([phone]  Information discussed with and copy given to patient by: Elease Hashimoto, 04/05/2016, 11:19 AM

## 2016-04-05 NOTE — Progress Notes (Signed)
Subjective/Complaints:   Objective: Vital Signs: Blood pressure 133/62, pulse 69, temperature 98.6 F (37 C), temperature source Oral, resp. rate 18, height _0  (1.702 m), weight 76.7 kg (169 lb 3.2 oz), SpO2 95 %. No results found. Results for orders placed or performed during the hospital encounter of 04/04/16 (from the past 72 hour(s))  Urinalysis, Routine w reflex microscopic     Status: Abnormal   Collection Time: 04/04/16 10:26 PM  Result Value Ref Range   Color, Urine YELLOW YELLOW   APPearance CLOUDY (A) CLEAR   Specific Gravity, Urine 1.019 1.005 - 1.030   pH 6.0 5.0 - 8.0   Glucose, UA NEGATIVE NEGATIVE mg/dL   Hgb urine dipstick NEGATIVE NEGATIVE   Bilirubin Urine NEGATIVE NEGATIVE   Ketones, ur NEGATIVE NEGATIVE mg/dL   Protein, ur 30 (A) NEGATIVE mg/dL   Nitrite NEGATIVE NEGATIVE   Leukocytes, UA LARGE (A) NEGATIVE   RBC / HPF 6-30 0 - 5 RBC/hpf   WBC, UA TOO NUMEROUS TO COUNT 0 - 5 WBC/hpf   Bacteria, UA RARE (A) NONE SEEN   Squamous Epithelial / LPF NONE SEEN NONE SEEN  CBC WITH DIFFERENTIAL     Status: Abnormal   Collection Time: 04/05/16  5:17 AM  Result Value Ref Range   WBC 11.3 (H) 4.0 - 10.5 K/uL   RBC 3.79 (L) 4.22 - 5.81 MIL/uL   Hemoglobin 11.6 (L) 13.0 - 17.0 g/dL   HCT 34.2 (L) 39.0 - 52.0 %   MCV 90.2 78.0 - 100.0 fL   MCH 30.6 26.0 - 34.0 pg   MCHC 33.9 30.0 - 36.0 g/dL   RDW 14.8 11.5 - 15.5 %   Platelets 397 150 - 400 K/uL   Neutrophils Relative % 75 %   Neutro Abs 8.6 (H) 1.7 - 7.7 K/uL   Lymphocytes Relative 14 %   Lymphs Abs 1.5 0.7 - 4.0 K/uL   Monocytes Relative 10 %   Monocytes Absolute 1.1 (H) 0.1 - 1.0 K/uL   Eosinophils Relative 1 %   Eosinophils Absolute 0.1 0.0 - 0.7 K/uL   Basophils Relative 0 %   Basophils Absolute 0.0 0.0 - 0.1 K/uL  Comprehensive metabolic panel     Status: Abnormal   Collection Time: 04/05/16  5:17 AM  Result Value Ref Range   Sodium 126 (L) 135 - 145 mmol/L   Potassium 3.8 3.5 - 5.1 mmol/L   Chloride 98 (L) 101 - 111 mmol/L   CO2 22 22 - 32 mmol/L   Glucose, Bld 110 (H) 65 - 99 mg/dL   BUN 13 6 - 20 mg/dL   Creatinine, Ser 0.95 0.61 - 1.24 mg/dL   Calcium 8.6 (L) 8.9 - 10.3 mg/dL   Total Protein 6.1 (L) 6.5 - 8.1 g/dL   Albumin 2.7 (L) 3.5 - 5.0 g/dL   AST 39 15 - 41 U/L   ALT 21 17 - 63 U/L   Alkaline Phosphatase 66 38 - 126 U/L   Total Bilirubin 0.8 0.3 - 1.2 mg/dL   GFR calc non Af Amer >60 >60 mL/min   GFR calc Af Amer >60 >60 mL/min    Comment: (NOTE) The eGFR has been calculated using the CKD EPI equation. This calculation has not been validated in all clinical situations. eGFR's persistently <60 mL/min signify possible Chronic Kidney Disease.    Anion gap 6 5 - 15     HEENT: normal and poor dentition Cardio: RRR and no murmur Resp: CTA B/L and  unlabored GI: BS positive and NT, ND Extremity:  No Edema Skin:   Other scalp keratosis Neuro: Confused, Abnormal Sensory appears diminished RUE but difficult to assess due to hearing impairment, Abnormal Motor 3- RUE and RLE, some limitation RLE due to pain, Tone:  Hypertonia and Other HOH, oriented to person not place/time Musc/Skel:  Extremity tender right knee pain with flexion contracture, - effusion Gen NAD   Assessment/Plan: 1. 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 Physiatrist is providing close team supervision and 24 hour management of active medical problems listed below. Physiatrist and rehab team continue to assess barriers to discharge/monitor patient progress toward functional and medical goals. FIM:                   Function - Comprehension Comprehension: Auditory Comprehension assistive device: Hearing aids Comprehension assist level: Understands basic 50 - 74% of the time/ requires cueing 25 - 49% of the time  Function -  Expression Expression: Verbal Expression assist level: Expresses basic 50 - 74% of the time/requires cueing 25 - 49% of the time. Needs to repeat parts of sentences.  Function - Social Interaction Social Interaction assist level: Interacts appropriately 50 - 74% of the time - May be physically or verbally inappropriate.  Function - Problem Solving Problem solving assist level: Solves basic 50 - 74% of the time/requires cueing 25 - 49% of the time  Function - Memory Memory assist level: Recognizes or recalls 50 - 74% of the time/requires cueing 25 - 49% of the time Patient normally able to recall (first 3 days only): Current season   Medical Problem List and Plan: 1.  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 CIR PT, OT, SLP evals today 2.  DVT Prophylaxis/Anticoagulation: Mechanical: Sequential compression devices, below knee Bilateral lower extremities--start Lovenox tomorrow.  3. Pain Management: tylenol prn. Voltaren gel for knee pain.  4. Mood: LCSW to follow for evaluation and support.  5. Neuropsych: This patient not fully capable of making decisions on his own behalf. 6. Skin/Wound Care: routine pressure relief measures 7. Fluids/Electrolytes/Nutrition: Monitor I/O. Check lytes in am.  8. PAF: In NSR today.  To start ASA on Feb 2nd. Eliquis once hemorrhagic transformation absorbed.  9. Prediabetes: blood sugars have improved with weight loss and now Hgb A1c-5.0.  10. Blood pressures: Avoid hypotension ---low dose BB resumed for rate control.   11. Chronic knee pain/history of right hip replacement: Add Voltaren gel for relief. Also has Right knee contracture 12. Hyponatremia: Question chronic--has been in 130-132 range in the past. He was started on SSRI early this month for anxiety issues. Will decrease dose and monitor.  13. Iron deficiency anemia: On iron  supplement 14. Leukocytosis: Monitor for signs of infection.has had confusion last 2 days -will check UA/UCS 15.  Insomnia with daytime somnolence, will need to establish normal sleep/wake cycle LOS (Days) 1 A FACE TO FACE EVALUATION WAS PERFORMED  Kyran Whittier E 04/05/2016, 7:53 AM

## 2016-04-06 ENCOUNTER — Inpatient Hospital Stay (HOSPITAL_COMMUNITY): Payer: Medicare Other | Admitting: Speech Pathology

## 2016-04-06 ENCOUNTER — Inpatient Hospital Stay (HOSPITAL_COMMUNITY): Payer: Medicare Other | Admitting: Physical Therapy

## 2016-04-06 ENCOUNTER — Inpatient Hospital Stay (HOSPITAL_COMMUNITY): Payer: Medicare Other | Admitting: Occupational Therapy

## 2016-04-06 DIAGNOSIS — G8191 Hemiplegia, unspecified affecting right dominant side: Secondary | ICD-10-CM

## 2016-04-06 MED ORDER — ASPIRIN 81 MG PO CHEW
81.0000 mg | CHEWABLE_TABLET | Freq: Every day | ORAL | Status: DC
  Administered 2016-04-06 – 2016-04-13 (×8): 81 mg via ORAL
  Filled 2016-04-06 (×8): qty 1

## 2016-04-06 NOTE — Progress Notes (Signed)
Occupational Therapy Session Note  Patient Details  Name: Herbert Marquez MRN: [id number] Date of Birth: 01-29-1928  Today's Date: 04/06/2016 OT Individual Time: [id number] OT Individual Time Calculation (min): 31 min    Skilled Therapeutic Interventions/Progress Updates:    Pt completed stand pivot transfer from the wheelchair to the therapy mat with min assist.  He was able to complete RUE FM coordination tasks once in sitting.  Attempted use of resistive clothespins to start but they were too difficult to manipulate with the RUE and he continued to attempt to place them with the LUE instead.  Transitioned to picking up 2" pegs with the RUE and attempting to place in the peg board.  He was only successful with 20% of trials and could not manipulate the peg if he did not pick it up correctly from the start.  He was able to pick them up from the peg board however using the RUE to place them back in the box.  Had him finish by picking up smaller 1-2 inch plastic animal figures and place in container.  Noted pt at times not aware that he had dropped the figurine and was still progressing with placing it back in the container, even though he had nothing in his hand.  Finished session with transfer back to the wheelchair with max assist as pt could not stand up as efficiently from EOM as he could the wheelchair with arm supports.  Transferred back to the bed with min assist using the RW to conclude session.  Pt left with spouse and NT in room.    Therapy Documentation Precautions:  Precautions Precautions: Fall Precaution Comments: RLE flexor tone at the hamstring Required Braces or Orthoses: Cervical Brace Cervical Brace: Soft collar (cervical collar for comfort only) Restrictions Weight Bearing Restrictions: No  Pain: Pain Assessment Pain Assessment: Faces Pain Score: 0-No pain Faces Pain Scale: Hurts a little bit Pain Type: Chronic pain Pain Location: Knee Pain Orientation: Right Pain  Descriptors / Indicators: Discomfort Pain Intervention(s): Repositioned ADL: See Function Navigator for Current Functional Status.   Therapy/Group: Individual Therapy  Falan Hensler OTR/L 04/06/2016, 3:48 PM

## 2016-04-06 NOTE — Progress Notes (Signed)
Occupational Therapy Session Note  Patient Details  Name: Herbert Marquez MRN: [id number] Date of Birth: Apr 11, 1927  Today's Date: 04/06/2016 OT Individual Time: 1102-1205 OT Individual Time Calculation (min): 63 min    Short Term Goals: Week 1:  OT Short Term Goal 1 (Week 1): Pt will complete UB bathing with supervision.  OT Short Term Goal 2 (Week 1): Pt will complete UB dressing with min assist sitting EOB or chair. OT Short Term Goal 3 (Week 1): Pt will complete LB bathing with mod assist sit to stand.  OT Short Term Goal 4 (Week 1): Pt will complete LB dressing with mod assist sit to stand.  OT Short Term Goal 5 (Week 1): Pt will complete toilet transfers with mod assist stand pivot.   Skilled Therapeutic Interventions/Progress Updates:    Pt completed shower, toileting, and dressing tasks.  Min assist for supine to sit EOB with use of rail.  Utilized Steady for sit to stand and for transfer into the shower to the bench.  Once on the shower seat he was able to bathe with mod demonstrational cueing secondary to decreased hearing without hearing aides.  Mod assist for sit to stand when washing peri area with pt pulling up on grab bar on the left side of shower.  Utilized Steady for transfer out of the shower and to the wheelchair after drying off.  He was able to complete LB dressing with max assist and mod demonstrational cueing for threading the RLE first.  Demonstrated difficulty threading the LLE after the right however.  Mod assist for sit to stand as well at the sink for pulling garments over hips, as long as he pulled up on the sink with at least one UE and pushed from the chair with the other.  He reported needing to toilet so rolled wheelchair into the bathroom where he pulled up on the grab bar and performed partial turn with mod assist.  Therapist had to manage clothing before and after toileting.  When he finished and attempted stand pivot back to the wheelchair with use of the grab  bar, he was unable to support his weight over the RLE and step back with the left foot.  Therapist continued to cue him and have him attempt stepping but he would not secondary to increased fear.  Therapist pulled wheelchair up closer and helped pivot and lower him to the chair.  Pt left in wheelchair with safety belt in place and spouse present to assist with finishing grooming tasks at the sink.    Therapy Documentation Precautions:  Precautions Precautions: Fall Precaution Comments: RLE flexor tone at the hamstring Required Braces or Orthoses: Cervical Brace Cervical Brace: Soft collar (cervical collar for comfort only) Restrictions Weight Bearing Restrictions: No  Pain: Pain Assessment Pain Assessment: Faces Faces Pain Scale: Hurts a little bit Pain Type: Chronic pain Pain Location: Knee Pain Orientation: Right Pain Descriptors / Indicators: Discomfort Pain Intervention(s): Repositioned ADL: See Function Navigator for Current Functional Status.   Therapy/Group: Individual Therapy  Ariston Grandison OTR/L 04/06/2016, 12:53 PM

## 2016-04-06 NOTE — Progress Notes (Signed)
Speech Language Pathology Daily Session Note  Patient Details  Name: Herbert Marquez MRN: [id number] Date of Birth: 1927-03-23  Today's Date: 04/06/2016 SLP Individual Time: [id number] SLP Individual Time Calculation (min): 45 min  Short Term Goals: Week 1: SLP Short Term Goal 1 (Week 1): Pt will utilize word finding strategies at the structured phrase level with Min A verbal cues.  SLP Short Term Goal 2 (Week 1): Pt will utilize external memory aids to recall new, daily information with Mod A verbal cues.  SLP Short Term Goal 3 (Week 1): Pt will consistently demonstrate O x 4 with Mod A cues.  SLP Short Term Goal 4 (Week 1): Pt will sustain attention to basic, familiar task for ~ 1 minute intervals with Mod A verbal cues.  SLP Short Term Goal 5 (Week 1): Pt will complete basic, familiar tasks with Mod A verbal cues for functional problem solving.   Skilled Therapeutic Interventions: Skilled treatment session focused on cognition goals to facilitate expressive language tasks. Pt was alert this session and SLP facilitated session by providing Max A multimodal cues for comprehension of all tasks. Written information appeared more beneficial. Pt required Max A multimodal cues for orientation information, focused attention to basic tasks and Max A verbal cues with demonstration to comprehend object naming tasks. Pt able to verbally name 10/15 objects with Mod A verbal cues (semantic cues). Max A written and verbal cues given for recall of rote information (months, days of week(). Education provided to wife on severity of pt's deficits and handouts left with wife to aided in recall of orientation information and expressive language. Pt left upright in bed, wife present and all needs within reach. Continue per current plan of care.   LTG's downgraded d/t severity of impairments. Anticipate pt will need more than supervision at time of discharge.      Function:    Cognition Comprehension  Comprehension assist level: Understands basic 25 - 49% of the time/ requires cueing 50 - 75% of the time  Expression   Expression assist level: Expresses basic 50 - 74% of the time/requires cueing 25 - 49% of the time. Needs to repeat parts of sentences.  Social Interaction Social Interaction assist level: Interacts appropriately 50 - 74% of the time - May be physically or verbally inappropriate.  Problem Solving Problem solving assist level: Solves basic 25 - 49% of the time - needs direction more than half the time to initiate, plan or complete simple activities  Memory Memory assist level: Recognizes or recalls 25 - 49% of the time/requires cueing 50 - 75% of the time    Pain    Therapy/Group: Individual Therapy  Karena Kinker B. Rutherford Nail, M.S., CCC-SLP Speech-Language Pathologist  [name] 04/06/2016, 11:08 AM

## 2016-04-06 NOTE — Progress Notes (Signed)
Physical Therapy Session Note  Patient Details  Name: Herbert Marquez MRN: [id number] Date of Birth: 12-Jun-1927  Today's Date: 04/06/2016 PT Individual Time: 1305-1400 PT Individual Time Calculation (min): 55 min   Short Term Goals: Week 1:  PT Short Term Goal 1 (Week 1): Patient will transfer with mod assist consistently PT Short Term Goal 2 (Week 1): Patient will Ambulate 41f with mod assist  PT Short Term Goal 3 (Week 1): Patient will ascend/descent 2 steps with Mod assist  PT Short Term Goal 4 (Week 1): Patient will propell WC 1571fwith supervision assist  PT Short Term Goal 5 (Week 1): Patient will perform bed mobility with mod assist consistently.   Skilled Therapeutic Interventions/Progress Updates:   WC mobility x 150 ft with supervision assist for obstacle negotiaion and min assist for WC parts management. '  Blocked practice stand pivot transfer x 2 R and L with mod assist progressing to Min assist from PT with min cues for proper UE sequencing and improved anterior weight shift.   Gait training in hall instructed by PT with RW and in assist x 20 ft and 2586fManual facilitation for AD management. Patient noted to have mild ataxia in the RLE as well as flexion knee posture. One instance of knee instability while turning to sit, pt able to self correct without additional supprt.   Stand pivot to and from nustep with min assist and RW and min assist from PT. Nustep x 10 minutes for reciprocal movement pattern training and cardiovascular endurance. Min cues from PT for improved R LE hip control to prevent excessive ER and proper speed to prevent early fatigue.   Patient returned to room and left supine with call bell in reach and all needs met. Patient performed sit>supine transfer with mod assist to control the RLE.            Therapy Documentation Precautions:  Precautions Precautions: Fall Precaution Comments: RLE flexor tone at the hamstring Required Braces or  Orthoses: Cervical Brace Cervical Brace: Soft collar (cervical collar for comfort only) Restrictions Weight Bearing Restrictions: No Pain: Pain Assessment Pain Assessment: Faces Faces Pain Scale: Hurts a little bit Pain Type: Chronic pain Pain Location: Knee Pain Orientation: Right Pain Descriptors / Indicators: Discomfort Pain Intervention(s): Repositioned  See Function Navigator for Current Functional Status.   Therapy/Group: Individual Therapy  AusLorie Phenix2/2018, 2:06 PM

## 2016-04-06 NOTE — Progress Notes (Signed)
Subjective/Complaints:   Objective: Vital Signs: Blood pressure (!) 144/66, pulse 60, temperature 98.2 F (36.8 C), temperature source Oral, resp. rate 18, height 5' 7"  (1.702 m), weight 76.7 kg (169 lb 3.2 oz), SpO2 97 %. No results found. Results for orders placed or performed during the hospital encounter of 04/04/16 (from the past 72 hour(s))  Urinalysis, Routine w reflex microscopic     Status: Abnormal   Collection Time: 04/04/16 10:26 PM  Result Value Ref Range   Color, Urine YELLOW YELLOW   APPearance CLOUDY (A) CLEAR   Specific Gravity, Urine 1.019 1.005 - 1.030   pH 6.0 5.0 - 8.0   Glucose, UA NEGATIVE NEGATIVE mg/dL   Hgb urine dipstick NEGATIVE NEGATIVE   Bilirubin Urine NEGATIVE NEGATIVE   Ketones, ur NEGATIVE NEGATIVE mg/dL   Protein, ur 30 (A) NEGATIVE mg/dL   Nitrite NEGATIVE NEGATIVE   Leukocytes, UA LARGE (A) NEGATIVE   RBC / HPF 6-30 0 - 5 RBC/hpf   WBC, UA TOO NUMEROUS TO COUNT 0 - 5 WBC/hpf   Bacteria, UA RARE (A) NONE SEEN   Squamous Epithelial / LPF NONE SEEN NONE SEEN  CBC WITH DIFFERENTIAL     Status: Abnormal   Collection Time: 04/05/16  5:17 AM  Result Value Ref Range   WBC 11.3 (H) 4.0 - 10.5 K/uL   RBC 3.79 (L) 4.22 - 5.81 MIL/uL   Hemoglobin 11.6 (L) 13.0 - 17.0 g/dL   HCT 34.2 (L) 39.0 - 52.0 %   MCV 90.2 78.0 - 100.0 fL   MCH 30.6 26.0 - 34.0 pg   MCHC 33.9 30.0 - 36.0 g/dL   RDW 14.8 11.5 - 15.5 %   Platelets 397 150 - 400 K/uL   Neutrophils Relative % 75 %   Neutro Abs 8.6 (H) 1.7 - 7.7 K/uL   Lymphocytes Relative 14 %   Lymphs Abs 1.5 0.7 - 4.0 K/uL   Monocytes Relative 10 %   Monocytes Absolute 1.1 (H) 0.1 - 1.0 K/uL   Eosinophils Relative 1 %   Eosinophils Absolute 0.1 0.0 - 0.7 K/uL   Basophils Relative 0 %   Basophils Absolute 0.0 0.0 - 0.1 K/uL  Comprehensive metabolic panel     Status: Abnormal   Collection Time: 04/05/16  5:17 AM  Result Value Ref Range   Sodium 126 (L) 135 - 145 mmol/L   Potassium 3.8 3.5 - 5.1 mmol/L    Chloride 98 (L) 101 - 111 mmol/L   CO2 22 22 - 32 mmol/L   Glucose, Bld 110 (H) 65 - 99 mg/dL   BUN 13 6 - 20 mg/dL   Creatinine, Ser 0.95 0.61 - 1.24 mg/dL   Calcium 8.6 (L) 8.9 - 10.3 mg/dL   Total Protein 6.1 (L) 6.5 - 8.1 g/dL   Albumin 2.7 (L) 3.5 - 5.0 g/dL   AST 39 15 - 41 U/L   ALT 21 17 - 63 U/L   Alkaline Phosphatase 66 38 - 126 U/L   Total Bilirubin 0.8 0.3 - 1.2 mg/dL   GFR calc non Af Amer >60 >60 mL/min   GFR calc Af Amer >60 >60 mL/min    Comment: (NOTE) The eGFR has been calculated using the CKD EPI equation. This calculation has not been validated in all clinical situations. eGFR's persistently <60 mL/min signify possible Chronic Kidney Disease.    Anion gap 6 5 - 15     HEENT: normal and poor dentition Cardio: RRR and no murmur Resp: CTA  B/L and unlabored GI: BS positive and NT, ND Extremity:  No Edema Skin:   Other scalp keratosis Neuro: Confused, Abnormal Sensory appears diminished RUE but difficult to assess due to hearing impairment, Abnormal Motor 3- RUE and RLE, some limitation RLE due to pain, Tone:  Hypertonia and Other HOH, oriented to person not place/time Musc/Skel:  Extremity tender right knee pain with flexion contracture, - effusion Gen NAD   Assessment/Plan: 1. 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 Physiatrist is providing close team supervision and 24 hour management of active medical problems listed below. Physiatrist and rehab team continue to assess barriers to discharge/monitor patient progress toward functional and medical goals. FIM: Function - Bathing Position: Sitting EOB Body parts bathed by patient: Right arm, Left arm, Chest, Abdomen, Right upper leg, Left upper leg, Right lower leg, Left lower leg Body parts bathed by helper: Front perineal area, Buttocks, Back Assist  Level: 2 helpers  Function- Upper Body Dressing/Undressing What is the patient wearing?: Pull over shirt/dress Pull over shirt/dress - Perfomed by helper: Thread/unthread right sleeve, Thread/unthread left sleeve, Put head through opening, Pull shirt over trunk Function - Lower Body Dressing/Undressing What is the patient wearing?: Pants, Socks, Shoes Pants- Performed by helper: Thread/unthread right pants leg, Thread/unthread left pants leg, Pull pants up/down Socks - Performed by helper: Don/doff right sock, Don/doff left sock Shoes - Performed by helper: Don/doff right shoe, Don/doff left shoe, Fasten right, Fasten left Assist for lower body dressing: 2 Helpers  Function - Toileting Toileting steps completed by helper: Adjust clothing prior to toileting, Performs perineal hygiene, Adjust clothing after toileting Assist level: Two helpers  Function - Air cabin crew transfer assistive device: Bedside commode Assist level to bedside commode (at bedside): Moderate assist (Pt 50 - 74%/lift or lower) Assist level from bedside commode (at bedside): Moderate assist (Pt 50 - 74%/lift or lower)  Function - Chair/bed transfer Chair/bed transfer method: Squat pivot Chair/bed transfer assist level: Maximal assist (Pt 25 - 49%/lift and lower) Chair/bed transfer assistive device: Armrests  Function - Locomotion: Wheelchair Will patient use wheelchair at discharge?: Yes Type: Manual Max wheelchair distance: 169f  Assist Level: Supervision or verbal cues Assist Level: Supervision or verbal cues Wheel 150 feet activity did not occur: Refused Turns around,maneuvers to table,bed, and toilet,negotiates 3% grade,maneuvers on rugs and over doorsills: No Function - Locomotion: Ambulation Assistive device: Parallel bars Max distance: 11f Assist level: Maximal assist (Pt 25 - 49%) Walk 50 feet with 2 turns activity did not occur: Safety/medical concerns Walk 150 feet activity did not  occur: Safety/medical concerns Walk 10 feet on uneven surfaces activity did not occur: Safety/medical concerns  Function - Comprehension Comprehension: Auditory Comprehension assistive device: Hearing aids Comprehension assist level: Understands basic 50 - 74% of the time/ requires cueing 25 - 49% of the time  Function - Expression Expression: Verbal Expression assist level: Expresses basic 50 - 74% of the time/requires cueing 25 - 49% of the time. Needs to repeat parts of sentences.  Function - Social Interaction Social Interaction assist level: Interacts appropriately 50 - 74% of the time - May be physically or verbally inappropriate.  Function - Problem Solving Problem solving assist level: Solves basic 25 - 49% of the time - needs direction more than half the time to initiate, plan or complete simple activities  Function - Memory Memory assist level: Recognizes or recalls  25 - 49% of the time/requires cueing 50 - 75% of the time Patient normally able to recall (first 3 days only): That he or she is in a hospital   Medical Problem List and Plan: 1.  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 CIR PT, OT, SLP evals today, per neuro resume ASA recheck CT next week prior to resumption of eliquis 2.  DVT Prophylaxis/Anticoagulation: Mechanical: Sequential compression devices, below knee Bilateral lower extremities--start Lovenox tomorrow.  3. Pain Management: tylenol prn. Voltaren gel for knee pain.  4. Mood: LCSW to follow for evaluation and support.  5. Neuropsych: This patient not fully capable of making decisions on his own behalf. 6. Skin/Wound Care: routine pressure relief measures 7. Fluids/Electrolytes/Nutrition: Monitor I/O. Check lytes in am.  8. PAF: In NSR today.  To start ASA on Feb 2nd. Eliquis once hemorrhagic transformation absorbed.  9. Prediabetes:  blood sugars have improved with weight loss and now Hgb A1c-5.0.  10. Blood pressures: Avoid hypotension ---low dose BB resumed for rate control.   11. Chronic knee pain/history of right hip replacement: Add Voltaren gel for relief. Also has Right knee contracture 12. Hyponatremia: Question chronic-, will d/c SSRI, would like to avoid fluid restriction if possible                                              13. Iron deficiency anemia: On iron supplement 14. Leukocytosis: Monitor for signs of infection.has had confusion last 2 days -will check UA/UCS 15.  Insomnia with daytime somnolence, will need to establish normal sleep/wake cycle, improved yesterday night but had to be changed when condom fell off 16.  Mild anemia will monitor once ASA resumed 17.  prob UTI on Keflex LOS (Days) 2 A FACE TO FACE EVALUATION WAS PERFORMED  KIRSTEINS,ANDREW E 04/06/2016, 7:42 AM

## 2016-04-07 ENCOUNTER — Inpatient Hospital Stay (HOSPITAL_COMMUNITY): Payer: Medicare Other

## 2016-04-07 ENCOUNTER — Inpatient Hospital Stay (HOSPITAL_COMMUNITY): Payer: Medicare Other | Admitting: Physical Therapy

## 2016-04-07 ENCOUNTER — Inpatient Hospital Stay (HOSPITAL_COMMUNITY): Payer: Medicare Other | Admitting: Occupational Therapy

## 2016-04-07 DIAGNOSIS — I482 Chronic atrial fibrillation: Secondary | ICD-10-CM

## 2016-04-07 LAB — URINE CULTURE: Culture: >=100000 — AB

## 2016-04-07 MED ORDER — AMOXICILLIN 250 MG PO CAPS
250.0000 mg | ORAL_CAPSULE | Freq: Three times a day (TID) | ORAL | Status: AC
  Administered 2016-04-07 – 2016-04-13 (×21): 250 mg via ORAL
  Filled 2016-04-07 (×20): qty 1

## 2016-04-07 NOTE — Progress Notes (Signed)
Subjective/Complaints:  Was incont of urine last noc per wife, bowels are moving per wife  ROS- negative  Objective: Vital Signs: Blood pressure (!) 123/55, pulse 68, temperature 97.6 F (36.4 C), temperature source Axillary, resp. rate 20, height 5' 7"  (1.702 m), weight 76.7 kg (169 lb 3.2 oz), SpO2 97 %. No results found. Results for orders placed or performed during the hospital encounter of 04/04/16 (from the past 72 hour(s))  Urine culture     Status: Abnormal   Collection Time: 04/04/16  5:35 PM  Result Value Ref Range   Specimen Description URINE, CLEAN CATCH    Special Requests NONE    Culture >=100,000 COLONIES/mL ENTEROCOCCUS FAECALIS (A)    Report Status 04/07/2016 FINAL    Organism ID, Bacteria ENTEROCOCCUS FAECALIS (A)       Susceptibility   Enterococcus faecalis - MIC*    AMPICILLIN <=2 SENSITIVE Sensitive     LEVOFLOXACIN >=8 RESISTANT Resistant     NITROFURANTOIN <=16 SENSITIVE Sensitive     VANCOMYCIN 1 SENSITIVE Sensitive     * >=100,000 COLONIES/mL ENTEROCOCCUS FAECALIS  Urinalysis, Routine w reflex microscopic     Status: Abnormal   Collection Time: 04/04/16 10:26 PM  Result Value Ref Range   Color, Urine YELLOW YELLOW   APPearance CLOUDY (A) CLEAR   Specific Gravity, Urine 1.019 1.005 - 1.030   pH 6.0 5.0 - 8.0   Glucose, UA NEGATIVE NEGATIVE mg/dL   Hgb urine dipstick NEGATIVE NEGATIVE   Bilirubin Urine NEGATIVE NEGATIVE   Ketones, ur NEGATIVE NEGATIVE mg/dL   Protein, ur 30 (A) NEGATIVE mg/dL   Nitrite NEGATIVE NEGATIVE   Leukocytes, UA LARGE (A) NEGATIVE   RBC / HPF 6-30 0 - 5 RBC/hpf   WBC, UA TOO NUMEROUS TO COUNT 0 - 5 WBC/hpf   Bacteria, UA RARE (A) NONE SEEN   Squamous Epithelial / LPF NONE SEEN NONE SEEN  CBC WITH DIFFERENTIAL     Status: Abnormal   Collection Time: 04/05/16  5:17 AM  Result Value Ref Range   WBC 11.3 (H) 4.0 - 10.5 K/uL   RBC 3.79 (L) 4.22 - 5.81 MIL/uL   Hemoglobin 11.6 (L) 13.0 - 17.0 g/dL   HCT 34.2 (L) 39.0 -  52.0 %   MCV 90.2 78.0 - 100.0 fL   MCH 30.6 26.0 - 34.0 pg   MCHC 33.9 30.0 - 36.0 g/dL   RDW 14.8 11.5 - 15.5 %   Platelets 397 150 - 400 K/uL   Neutrophils Relative % 75 %   Neutro Abs 8.6 (H) 1.7 - 7.7 K/uL   Lymphocytes Relative 14 %   Lymphs Abs 1.5 0.7 - 4.0 K/uL   Monocytes Relative 10 %   Monocytes Absolute 1.1 (H) 0.1 - 1.0 K/uL   Eosinophils Relative 1 %   Eosinophils Absolute 0.1 0.0 - 0.7 K/uL   Basophils Relative 0 %   Basophils Absolute 0.0 0.0 - 0.1 K/uL  Comprehensive metabolic panel     Status: Abnormal   Collection Time: 04/05/16  5:17 AM  Result Value Ref Range   Sodium 126 (L) 135 - 145 mmol/L   Potassium 3.8 3.5 - 5.1 mmol/L   Chloride 98 (L) 101 - 111 mmol/L   CO2 22 22 - 32 mmol/L   Glucose, Bld 110 (H) 65 - 99 mg/dL   BUN 13 6 - 20 mg/dL   Creatinine, Ser 0.95 0.61 - 1.24 mg/dL   Calcium 8.6 (L) 8.9 - 10.3 mg/dL  Total Protein 6.1 (L) 6.5 - 8.1 g/dL   Albumin 2.7 (L) 3.5 - 5.0 g/dL   AST 39 15 - 41 U/L   ALT 21 17 - 63 U/L   Alkaline Phosphatase 66 38 - 126 U/L   Total Bilirubin 0.8 0.3 - 1.2 mg/dL   GFR calc non Af Amer >60 >60 mL/min   GFR calc Af Amer >60 >60 mL/min    Comment: (NOTE) The eGFR has been calculated using the CKD EPI equation. This calculation has not been validated in all clinical situations. eGFR's persistently <60 mL/min signify possible Chronic Kidney Disease.    Anion gap 6 5 - 15     HEENT: normal and poor dentition Cardio: RRR and no murmur Resp: CTA B/L and unlabored GI: BS positive and NT, ND Extremity:  No Edema Skin:   Other scalp keratosis Neuro: Confused, Abnormal Sensory appears diminished RUE but difficult to assess due to hearing impairment, Abnormal Motor 3- RUE and RLE, some limitation RLE due to pain, Tone:  Hypertonia and Other HOH, oriented to person not place/time Musc/Skel:  Extremity tender right knee pain with flexion contracture, - effusion Gen NAD   Assessment/Plan: 1. 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 Physiatrist is providing close team supervision and 24 hour management of active medical problems listed below. Physiatrist and rehab team continue to assess barriers to discharge/monitor patient progress toward functional and medical goals. FIM: Function - Bathing Position: Sitting EOB Body parts bathed by patient: Right arm, Left arm, Chest, Abdomen, Front perineal area, Right upper leg, Left upper leg Body parts bathed by helper: Right lower leg, Left lower leg, Buttocks, Back Assist Level: 2 helpers  Function- Upper Body Dressing/Undressing What is the patient wearing?: Pull over shirt/dress Pull over shirt/dress - Perfomed by helper: Thread/unthread right sleeve, Thread/unthread left sleeve, Put head through opening Function - Lower Body Dressing/Undressing What is the patient wearing?: Pants, Socks, Shoes Position: Wheelchair/chair at sink Pants- Performed by patient: Thread/unthread right pants leg Pants- Performed by helper: Thread/unthread left pants leg, Pull pants up/down Socks - Performed by helper: Don/doff right sock, Don/doff left sock Shoes - Performed by helper: Don/doff right shoe, Don/doff left shoe, Fasten right, Fasten left Assist for lower body dressing: 2 Helpers  Function - Toileting Toileting steps completed by helper: Adjust clothing prior to toileting, Performs perineal hygiene, Adjust clothing after toileting Toileting Assistive Devices: Grab bar or rail Assist level: Two helpers  Function - Air cabin crew transfer assistive device: Bedside commode Mechanical lift: Stedy Assist level to bedside commode (at bedside): Total assist (Pt < 25%) Assist level from bedside commode (at bedside): Total assist (Pt < 25%)  Function - Chair/bed transfer Chair/bed transfer method: Squat  pivot Chair/bed transfer assist level: Maximal assist (Pt 25 - 49%/lift and lower) Chair/bed transfer assistive device: Armrests  Function - Locomotion: Wheelchair Will patient use wheelchair at discharge?: Yes Type: Manual Max wheelchair distance: 151f  Assist Level: Supervision or verbal cues Assist Level: Supervision or verbal cues Wheel 150 feet activity did not occur: Refused Turns around,maneuvers to table,bed, and toilet,negotiates 3% grade,maneuvers on rugs and over doorsills: No Function - Locomotion: Ambulation Assistive device: Parallel bars Max distance: 140f Assist level: Maximal assist (Pt 25 - 49%) Walk 50 feet with 2 turns activity did not occur: Safety/medical concerns Walk 150 feet activity did not occur: Safety/medical concerns Walk  10 feet on uneven surfaces activity did not occur: Safety/medical concerns  Function - Comprehension Comprehension: Auditory Comprehension assistive device: Hearing aids Comprehension assist level: Understands basic 25 - 49% of the time/ requires cueing 50 - 75% of the time  Function - Expression Expression: Verbal Expression assist level: Expresses basic 50 - 74% of the time/requires cueing 25 - 49% of the time. Needs to repeat parts of sentences.  Function - Social Interaction Social Interaction assist level: Interacts appropriately 50 - 74% of the time - May be physically or verbally inappropriate.  Function - Problem Solving Problem solving assist level: Solves basic 25 - 49% of the time - needs direction more than half the time to initiate, plan or complete simple activities  Function - Memory Memory assist level: Recognizes or recalls 25 - 49% of the time/requires cueing 50 - 75% of the time Patient normally able to recall (first 3 days only): None of the above   Medical Problem List and Plan: 1.  Weakness, neurologic gait deficits, limitations with self-care secondary to Left temporal lobe infarct with hemorrhagic  transformation with history of left CVA. CIR PT, OT, SLP evals today, per neuro resume ASA recheck CT next week prior to resumption of eliquis 2.  DVT Prophylaxis/Anticoagulation: Mechanical: Sequential compression devices, below knee Bilateral lower extremities--start Lovenox tomorrow.  3. Pain Management: tylenol prn. Voltaren gel for knee pain.  4. Mood: LCSW to follow for evaluation and support.  5. Neuropsych: This patient not fully capable of making decisions on his own behalf. 6. Skin/Wound Care: routine pressure relief measures 7. Fluids/Electrolytes/Nutrition: Monitor I/O. Check lytes in am.  8. PAF: In NSR today.  To start ASA on Feb 2nd. Eliquis once hemorrhagic transformation absorbed. Not tolerating BB due to low 9. Prediabetes: blood sugars have improved with weight loss and now Hgb A1c-5.0.  10. Blood pressures: Avoid hypotension ---low dose BB resumed for rate control.   11. Chronic knee pain/history of right hip replacement: Add Voltaren gel for relief.Right knee OA 12. Hyponatremia: Question chronic-, will d/c SSRI, would like to avoid fluid restriction if possible                                              13. Iron deficiency anemia: On iron supplement 14. Leukocytosis: Monitor for signs of infection.has had confusion last 2 days -will check UA/UCS 15.  Insomnia with daytime somnolence, will need to establish normal sleep/wake cycle,16.  Mild anemia will monitor on ASA check 2/7  17. UTI enterococcus, sens to amp, no sens checked to keflex will switch abx LOS (Days) 3 A FACE TO FACE EVALUATION WAS PERFORMED  Corrina Steffensen E 04/07/2016, 8:03 AM

## 2016-04-07 NOTE — Progress Notes (Signed)
Occupational Therapy Session Note  Patient Details  Name: Herbert Marquez MRN: [id number] Date of Birth: 01/28/1928  Today's Date: 04/07/2016 OT Individual Time: [id number] OT Individual Time Calculation (min): 30 min    Short Term Goals: Week 1:  OT Short Term Goal 1 (Week 1): Pt will complete UB bathing with supervision.  OT Short Term Goal 2 (Week 1): Pt will complete UB dressing with min assist sitting EOB or chair. OT Short Term Goal 3 (Week 1): Pt will complete LB bathing with mod assist sit to stand.  OT Short Term Goal 4 (Week 1): Pt will complete LB dressing with mod assist sit to stand.  OT Short Term Goal 5 (Week 1): Pt will complete toilet transfers with mod assist stand pivot.   Skilled Therapeutic Interventions/Progress Updates:    Pt seen for OT session focusing on functional use of R UE. Pt sitting up in w/c upon arrival with family present. Pt voicing increased fatigue from previous sessions and desiring to do therapy from w/c level. Pt's son mentioned earlier therapist suggested using wrist weights to assist with coordination/control of R UE. Trialed multiple size weights from 1/4LB to 3LB for UE control. Pt demonstrated best control with use of 2 1/4 pound weights up forearm. Pt desiring to practice handwriting. Completed with arm weights on and use of built up gripper on pen. Educated pt and wife on use of griper during self feeding and oral care tasks as well. Also educated regarding importance of proper positioning/ support of UE during functional tasks for increased control. Pt returned to room at end of session, desiring to return to bed. Mod A to stand and min pivot to bed. Pt returned to supine and left with all needs in reach and wife present.   Therapy Documentation Precautions:  Precautions Precautions: Fall Precaution Comments: RLE flexor tone at the hamstring Required Braces or Orthoses: Cervical Brace Cervical Brace: Soft collar (cervical collar for comfort  only) Restrictions Weight Bearing Restrictions: No Pain:   No/ denies pain  See Function Navigator for Current Functional Status.   Therapy/Group: Individual Therapy  Lewis, Javyn Havlin C 04/07/2016, 6:49 AM

## 2016-04-07 NOTE — Progress Notes (Signed)
Physical Therapy Session Note  Patient Details  Name: Herbert Marquez MRN: [id number] Date of Birth: [birth year]  Today's Date: 04/07/2016 PT Individual Time: 0805-0901  AND [id number] PT Individual Time Calculation (min): 56 min AND 43 min   Short Term Goals: Week 1:  PT Short Term Goal 1 (Week 1): Patient will transfer with mod assist consistently PT Short Term Goal 2 (Week 1): Patient will Ambulate 106f with mod assist  PT Short Term Goal 3 (Week 1): Patient will ascend/descent 2 steps with Mod assist  PT Short Term Goal 4 (Week 1): Patient will propell WC 1514fwith supervision assist  PT Short Term Goal 5 (Week 1): Patient will perform bed mobility with mod assist consistently.   Skilled Therapeutic Interventions/Progress Updates:   Pt received supine in bed and agreeable to PT. Supine>sit transfer withClose supervision assist from PT and heavy use of rails. Min cues for R LE management and proper UE placement to push from bed rails to sitting position.   Sit<>stand to don pants at EOB with min assist to come to standing position and min assist to manage clothing with LUE.   Stand pivot transfer training completed x 8 throughout treatment with min assist from PT. Min cues for proper UE placement to push from sitting surface and increased anterior weight shift.   Toilet transfer with min assist from PT and mod assist to manage clothing. Min cues for proper use of rails on 3in1 as well as gait pattern to turn to WC from toilet.   WC mobility through hall x 15027fnd supervisoin assist from PT.  Gait training x 63f90fth RW and min assist from PT for safety. Min cues for AD management and for imrpoved step length on the RLE.   Ascend and descend 3 steps (3") with mod assist for LLE foot placement with step to gait pattern instruction provided by PT as well as cues for improved use BUE to decrease WB through RLE.   PT instructed patient in Car transfer with RW Min assist from PT and mod  cues for proper UE positioning.   Patient returned to room and [facility] reach and all needs met.     Session 2.   Pt received sitting in WC and agreeable to PT  WC mobility in various environments including hall in hospital, food court, and over carpeted surface 150ft41f00ft,48f 175ft w49fsupervision assist.   Gait with RW x 32ft wi106fupervision assist progressing to min assist due to mild knee instability and increased fatigue  Side stepping at rail in hall x 5 ft L and R. Patient demonstrates poor ability to weight bear through RLE, but reports no pain..   Sit<>stand trasfer with supervision assist x 5 throughout treatment. PT provided moderate cues for safety and improved anterior weight shift. Patient requires to lock knees against chair to come to standing.   Patient returned to room and left sitting in [facility] reach and all needs met.        Therapy Documentation Precautions:  Precautions Precautions: Fall Precaution Comments: RLE flexor tone at the hamstring Required Braces or Orthoses: Cervical Brace Cervical Brace: Soft collar (cervical collar for comfort only) Restrictions Weight Bearing Restrictions: No   Pain: 0/10   See Function Navigator for Current Functional Status.   Therapy/Group: Individual Therapy  Addalee Kavanagh ELorie Phenix8, 9:48 AM

## 2016-04-07 NOTE — Progress Notes (Signed)
Occupational Therapy Session Note  Patient Details  Name: Herbert Marquez MRN: [id number] Date of Birth: [birth year]  Today's Date: 04/07/2016 OT Individual Time: 1100-1200 OT Individual Time Calculation (min): 60 min   Short Term Goals: Week 1:  OT Short Term Goal 1 (Week 1): Pt will complete UB bathing with supervision.  OT Short Term Goal 2 (Week 1): Pt will complete UB dressing with min assist sitting EOB or chair. OT Short Term Goal 3 (Week 1): Pt will complete LB bathing with mod assist sit to stand.  OT Short Term Goal 4 (Week 1): Pt will complete LB dressing with mod assist sit to stand.  OT Short Term Goal 5 (Week 1): Pt will complete toilet transfers with mod assist stand pivot.   Skilled Therapeutic Interventions/Progress Updates: ADL-retraining at shower level with focus on improved transfers, functional endurance, attention, dynamic standing balance, and NMR of RUE.   Pt received supine in bed with family present.   OT re-educated pt on methods/goals of treatment as family left room to allow pt to focus his attention on goals of treatment.   With setup to provide w/c, pt completed squat pivot transfer to w/c and was escorted to bathroom for shower using tub bench with steadying assist and mod vc for technique.  With mod vc to sequence, pt completed stand-pivot transfer to tub bench using grab bar and mod assist to lift from w/c.   Pt doffed clothing unassisted with vc to sequence and 1 rest break d/t fatigue.   Pt bathed on bench, standing 1 time to wash his buttocks with steadying assist to maintain standing balance.  Pt rested again after bathing and completed transfer back to w/c to dress at sink.   Pt donned clothing with mod instructional cues to dress right side first and to incorporate right hand in all tasks.   After dressing OT re-assessed pt's performance of toilet transfer which required only mod assist (Pt=90%) to lift from w/c and steadying assist to complete stand-pivot  transfer to/from toilet w/o use of BSC over toilet.  Safety plan updated as pt did not require use of Stedy lift during any portion of session.  Therapy Documentation Precautions:  Precautions Precautions: Fall Precaution Comments: RLE flexor tone at the hamstring Required Braces or Orthoses: Cervical Brace Cervical Brace: Soft collar (cervical collar for comfort only) Restrictions Weight Bearing Restrictions: No   Pain: No/denies pain     See Function Navigator for Current Functional Status.   Therapy/Group: Individual Therapy  [place] 04/07/2016, 12:59 PM

## 2016-04-07 NOTE — IPOC Note (Signed)
Overall Plan of Care [facility]) Patient Details Name: Herbert Marquez MRN: [id number] DOB: February 15, 1928  Admitting Diagnosis: L CVA  Hospital Problems: Active Problems:   Cerebral hemorrhage (HCC) w/ SDH s/p IV tPA   Stroke (cerebrum) [facility])     Functional Problem List: Nursing Bladder, Bowel, Edema, Endurance, Medication Management, Nutrition, Pain, Safety, Skin Integrity  PT Behavior, Motor, Endurance, Safety, Sensory, Perception, Nutrition, Balance, Pain  OT Balance, Cognition, Endurance, Safety, Pain, Motor  SLP    TR         Basic ADL's: OT Eating, Grooming, Bathing, Dressing, Toileting     Advanced  ADL's: OT       Transfers: PT Bed Mobility, Bed to Chair, Car, Furniture, Floor  OT Tub/Shower, Agricultural engineer: PT Ambulation, Emergency planning/management officer, Stairs     Additional Impairments: OT Fuctional Use of Upper Extremity  SLP Communication      TR      Anticipated Outcomes Item Anticipated Outcome  Self Feeding modified independent  Swallowing      Basic self-care  min assist level  Toileting  min assist level   Bathroom Transfers min assist level  Bowel/Bladder  Min to mod assist with bowel and bladder  Transfers  Min assist with LRAD   Locomotion  Supervision WC level and min assist for gait for household distances.   Communication  Supervision  Cognition  Supervision  Pain  <4 on a 0-10 pain scale  Safety/Judgment  mod assist with safety and ambulation, min assist when calling for assistance   Therapy Plan: PT Intensity: Minimum of 1-2 x/day ,45 to 90 minutes PT Frequency: 5 out of 7 days PT Duration Estimated Length of Stay: 18-21days  OT Intensity: Minimum of 1-2 x/day, 45 to 90 minutes OT Frequency: 5 out of 7 days OT Duration/Estimated Length of Stay: 14-16 days SLP Intensity: Minumum of 1-2 x/day, 30 to 90 minutes SLP Frequency: 3 to 5 out of 7 days SLP Duration/Estimated Length of Stay: 2 to 2.5 weeks       Team  Interventions: Nursing Interventions Patient/Family Education, Bladder Management, Bowel Management, Disease Management/Prevention, Pain Management, Discharge Planning, Skin Care/Wound Management, Medication Management, Psychosocial Support  PT interventions Ambulation/gait training, Cognitive remediation/compensation, Community reintegration, Training and development officer, Discharge planning, Disease management/prevention, DME/adaptive equipment instruction, Functional electrical stimulation, Functional mobility training, Neuromuscular re-education, Patient/family education, Pain management, Psychosocial support, Skin care/wound management, Splinting/orthotics, Stair training, Therapeutic Activities, Therapeutic Exercise, UE/LE Coordination activities, UE/LE Strength taining/ROM, Visual/perceptual remediation/compensation, Wheelchair propulsion/positioning  OT Interventions Training and development officer, Cognitive remediation/compensation, Discharge planning, DME/adaptive equipment instruction, Disease mangement/prevention, Functional mobility training, Pain management, Neuromuscular re-education, Patient/family education, Self Care/advanced ADL retraining, Therapeutic Exercise, UE/LE Strength taining/ROM, Therapeutic Activities, UE/LE Coordination activities, Wheelchair propulsion/positioning  SLP Interventions Cognitive remediation/compensation, English as a second language teacher, Environmental controls, Therapeutic Activities, Patient/family education, Functional tasks, Speech/Language facilitation  TR Interventions    SW/CM Interventions Discharge Planning, Psychosocial Support, Patient/Family Education    Team Discharge Planning: Destination: PT-Home ,OT- Home , SLP-Home Projected Follow-up: PT-Home health PT, OT-  Home health OT, 24 hour supervision/assistance, SLP-Home Health SLP Projected Equipment Needs: PT-To be determined, OT- None recommended by OT, SLP-None recommended by SLP Equipment Details: PT-pt has RW,  WC, transport chair, OT-  Patient/family involved in discharge planning: PT- Patient,  OT-Patient, Family member/caregiver, SLP-Patient, Family member/caregiver  MD ELOS: 18-21d Medical Rehab Prognosis:  Good Assessment:  83 y.o.right handed malewith history of CAD s/p stent, PAF, HTN,  T2DM, left thalamic infarct 9/17--on ASA/plavix PTA, RLE weakness  post R-THR; who was admitted on 03/30/16 with difficulty speaking and confusion. History taken from chart review and wife.  CT head without acute abnormality and was treated with tPA. MRI brain reviewed, negative, but limited due to motion.   Follow up  CTA head and neck done revealing left temporal lobe hemorrhagic infarct with subdural extension in middle cranial fossa, 60-70% R-ICA stenosis and 40-50% L-ICA stenosis.  2D echo with EF 65- 70% with mild LVH. Dr. Erlinda Hong felt that stroke embolic due to A fib with hemorrhagic conversion. He recommends "considering starting ASA day 7 post stroke and then anticoagulation once blood absorbed."  He had Afib with HR in 130s that has improved with addition of low dose BB   Now requiring 24/7 Rehab RN,MD, as well as CIR level PT, OT and SLP.  Treatment team will focus on ADLs and mobility with goals set at See Team Conference Notes for weekly updates to the plan of care

## 2016-04-08 ENCOUNTER — Inpatient Hospital Stay (HOSPITAL_COMMUNITY): Payer: Medicare Other

## 2016-04-08 NOTE — Progress Notes (Signed)
Orthopedic Tech Progress Note Patient Details:  Herbert Marquez February 21, 1928 [id number]  Ortho Devices Type of Ortho Device: Knee Sleeve Ortho Device/Splint Location: rle Ortho Device/Splint Interventions: Application   [name] 04/08/2016, 11:21 AM

## 2016-04-08 NOTE — Progress Notes (Signed)
Subjective/Complaints: Had a lot of therapy yesterday but tolerated well Still has freq of urination but this is chronic some mild burning with urination  ROS- negative  Objective: Vital Signs: Blood pressure 132/80, pulse 72, temperature 98.9 F (37.2 C), temperature source Oral, resp. rate 16, height 5\' 7"  (1.702 m), weight 76.7 kg (169 lb 3.2 oz), SpO2 96 %. No results found. No results found for this or any previous visit (from the past 72 hour(s)).   HEENT: normal and poor dentition Cardio: RRR and no murmur Resp: CTA B/L and unlabored GI: BS positive and NT, ND Extremity:  No Edema Skin:   Other scalp keratosis Neuro: Confused, Abnormal Sensory appears diminished RUE but difficult to assess due to hearing impairment, Abnormal Motor 3- RUE and RLE, some limitation RLE due to pain, Tone:  Hypertonia and Other HOH, oriented to person not place/time Musc/Skel:  Extremity tender right knee pain with flexion contracture, - effusion Gen NAD   Assessment/Plan: 1. 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 Physiatrist is providing close team supervision and 24 hour management of active medical problems listed below. Physiatrist and rehab team continue to assess barriers to discharge/monitor patient progress toward functional and medical goals. FIM: Function - Bathing Position: Shower Body parts bathed by patient: Right arm, Left arm, Chest, Abdomen, Front perineal area, Buttocks, Right upper leg, Left upper leg, Right lower leg, Left lower leg Body parts bathed by helper: Back Assist Level: Touching or steadying assistance(Pt > 75%)  Function- Upper Body Dressing/Undressing What is the patient wearing?: Pull over shirt/dress Pull over shirt/dress - Perfomed by patient: Thread/unthread right sleeve, Thread/unthread left sleeve,  Put head through opening, Pull shirt over trunk Pull over shirt/dress - Perfomed by helper: Thread/unthread right sleeve, Thread/unthread left sleeve, Put head through opening Assist Level: Supervision or verbal cues, More than reasonable time, Set up Set up : To obtain clothing/put away Function - Lower Body Dressing/Undressing What is the patient wearing?: Underwear, Pants, Non-skid slipper socks Position: Wheelchair/chair at sink Underwear - Performed by patient: Thread/unthread right underwear leg, Thread/unthread left underwear leg, Pull underwear up/down Pants- Performed by patient: Thread/unthread right pants leg, Thread/unthread left pants leg, Pull pants up/down, Fasten/unfasten pants Pants- Performed by helper: Thread/unthread left pants leg, Pull pants up/down Non-skid slipper socks- Performed by patient: Don/doff right sock, Don/doff left sock Socks - Performed by helper: Don/doff right sock, Don/doff left sock Shoes - Performed by helper: Don/doff right shoe, Don/doff left shoe, Fasten right, Fasten left Assist for lower body dressing: Touching or steadying assistance (Pt > 75%)  Function - Toileting Toileting steps completed by helper: Adjust clothing prior to toileting, Performs perineal hygiene, Adjust clothing after toileting Toileting Assistive Devices: Grab bar or rail Assist level: Two helpers  Function - Air cabin crew transfer assistive device: Radio broadcast assistant lift: Stedy Assist level to toilet: Moderate assist (Pt 50 - 74%/lift or lower) Assist level from toilet: Moderate assist (Pt 50 - 74%/lift or lower) Assist level to bedside commode (at bedside): Total assist (Pt < 25%) Assist level from bedside commode (at bedside): Total assist (Pt < 25%)  Function - Chair/bed transfer Chair/bed transfer method: Stand pivot Chair/bed transfer assist level: Touching or steadying assistance (Pt > 75%) Chair/bed transfer assistive device: Armrests,  Walker  Function - Locomotion: Wheelchair Will patient use wheelchair at discharge?: Yes Type: Manual Max wheelchair distance: 216ft  Assist Level: Supervision or verbal cues Assist Level: Supervision or verbal cues Wheel 150 feet activity did not occur: Refused Assist Level: Supervision or verbal cues Turns around,maneuvers to table,bed, and toilet,negotiates 3% grade,maneuvers on rugs and over doorsills: No Function - Locomotion: Ambulation Assistive device: Walker-rolling Max distance: 30 Assist level: Touching or steadying assistance (Pt > 75%) Assist level: Touching or steadying assistance (Pt > 75%) Walk 50 feet with 2 turns activity did not occur: Safety/medical concerns Walk 150 feet activity did not occur: Safety/medical concerns Walk 10 feet on uneven surfaces activity did not occur: Safety/medical concerns  Function - Comprehension Comprehension: Auditory Comprehension assistive device: Hearing aids Comprehension assist level: Understands basic 75 - 89% of the time/ requires cueing 10 - 24% of the time  Function - Expression Expression: Verbal Expression assist level: Expresses basic 75 - 89% of the time/requires cueing 10 - 24% of the time. Needs helper to occlude trach/needs to repeat words.  Function - Social Interaction Social Interaction assist level: Interacts appropriately 75 - 89% of the time - Needs redirection for appropriate language or to initiate interaction.  Function - Problem Solving Problem solving assist level: Solves basic 50 - 74% of the time/requires cueing 25 - 49% of the time  Function - Memory Memory assist level: Recognizes or recalls 50 - 74% of the time/requires cueing 25 - 49% of the time Patient normally able to recall (first 3 days only): Current season, That he or she is in a hospital, Location of own room, Staff names and faces   Medical Problem List and Plan: 1.  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 CIR PT, OT, SLP continues .  per neuro resume ASA recheck CT next week prior to resumption of eliquis 2.  DVT Prophylaxis/Anticoagulation: Mechanical: Sequential compression devices, below knee Bilateral lower extremities--start Lovenox tomorrow.  3. Pain Management: tylenol prn. Voltaren gel for knee pain.4. Mood: LCSW to follow for evaluation and support.  5. Neuropsych: This patient not fully capable of making decisions on his own behalf. 6. Skin/Wound Care: routine pressure relief measures 7. Fluids/Electrolytes/Nutrition: Monitor I/O. Check lytes in am.  8. PAF: I To start ASA on Feb 2nd. Eliquis once hemorrhagic transformation absorbed.  Monitor for tachy  Vitals:   04/07/16 2015 04/08/16 0617  BP: 101/63 132/80  Pulse: 75 72  Resp:  16  Temp:  98.9 F (37.2 C)   9. Prediabetes: blood sugars have improved with weight loss and now Hgb A1c-5.0.  10. Blood pressures: Avoid hypotension ---low dose BB resumed for rate control.   11. Chronic knee pain/history of right hip replacement: Add Voltaren gel for relief.Right knee OA   Order neoprene sleeve for comfort, has knee orthosis for walking  12. Hyponatremia: Question chronic-, will d/c SSRI, would like to avoid fluid restriction if possible                                              13. Iron deficiency anemia: On iron supplement 14. Leukocytosis: Monitor for signs of infection.has had confusion last 2 days -will check UA/UCS 15.  Insomnia with daytime somnolence, will need to establish normal sleep/wake cycle,16.  Mild anemia will monitor on ASA check 2/7  17. UTI enterococcus, sens to amp, no sens checked to keflex will  switch abx LOS (Days) 4 A FACE TO FACE EVALUATION WAS PERFORMED  , E 04/08/2016, 8:47 AM

## 2016-04-08 NOTE — Progress Notes (Signed)
Occupational Therapy Session Note  Patient Details  Name: Herbert Marquez MRN: [id number] Date of Birth: [birth year]  Today's Date: 04/08/2016 OT Individual Time: 1345-1430 OT Individual Time Calculation (min): 45 min   Short Term Goals: Week 1:  OT Short Term Goal 1 (Week 1): Pt will complete UB bathing with supervision.  OT Short Term Goal 2 (Week 1): Pt will complete UB dressing with min assist sitting EOB or chair. OT Short Term Goal 3 (Week 1): Pt will complete LB bathing with mod assist sit to stand.  OT Short Term Goal 4 (Week 1): Pt will complete LB dressing with mod assist sit to stand.  OT Short Term Goal 5 (Week 1): Pt will complete toilet transfers with mod assist stand pivot.   Skilled Therapeutic Interventions/Progress Updates: Therapeutic activity with focus on improved transfers, activity tolerance, family education (wife present), and improved gross/FMC of right hand.   Pt received supine in bed with family present, requesting assist to toilet using urinal.   Pt able to rise to EOB with mod assist and hold urinal although unsuccessful to void urine after 7 minutes (alternate method attempted long sitting in bed as wife says pt uses urinal at home as well).    Pt was then escorted to rehab gym and completed pipe tree with mod-max vc to inhibit using his left hand throughout task.   Pt was then educated on HEP to improve [facility] (literature provided) and was provided theraputty exercises using soft putty (yellow).   Pt complete 4 exercises with max vc for technique (reinforcement recommended).   OT educated spouse on HEP and provided literature to place in Patient Handbook at end of session.     Therapy Documentation Precautions:  Precautions Precautions: Fall Precaution Comments: RLE flexor tone at the hamstring Required Braces or Orthoses: Cervical Brace Cervical Brace: Soft collar (cervical collar for comfort only) Restrictions Weight Bearing Restrictions: No   Vital  Signs: Therapy Vitals Temp: 98.8 F (37.1 C) Temp Source: Oral Pulse Rate: 96 Resp: 16 BP: 96/60 Patient Position (if appropriate): Sitting Oxygen Therapy SpO2: 97 % O2 Device: Not Delivered   Pain: No/denies pain  See Function Navigator for Current Functional Status.   Therapy/Group: Individual Therapy  [place] 04/08/2016, 3:16 PM

## 2016-04-09 ENCOUNTER — Inpatient Hospital Stay (HOSPITAL_COMMUNITY): Payer: Medicare Other | Admitting: Speech Pathology

## 2016-04-09 ENCOUNTER — Inpatient Hospital Stay (HOSPITAL_COMMUNITY): Payer: Medicare Other | Admitting: Physical Therapy

## 2016-04-09 ENCOUNTER — Inpatient Hospital Stay (HOSPITAL_COMMUNITY): Payer: Medicare Other | Admitting: Occupational Therapy

## 2016-04-09 LAB — BASIC METABOLIC PANEL
Anion gap: 4 — ABNORMAL LOW (ref 5–15)
BUN: 10 mg/dL (ref 6–20)
CO2: 25 mmol/L (ref 22–32)
Calcium: 8.7 mg/dL — ABNORMAL LOW (ref 8.9–10.3)
Chloride: 102 mmol/L (ref 101–111)
Creatinine, Ser: 0.9 mg/dL (ref 0.61–1.24)
GFR calc Af Amer: >60 mL/min (ref >60)
GFR calc non Af Amer: >60 mL/min (ref >60)
Glucose, Bld: 98 mg/dL (ref 65–99)
Potassium: 3.8 mmol/L (ref 3.5–5.1)
Sodium: 131 mmol/L — ABNORMAL LOW (ref 135–145)

## 2016-04-09 NOTE — Progress Notes (Signed)
Subjective/Complaints:   ROS- negative CP, SOB, N/V/D  Objective: Vital Signs: Blood pressure 122/80, pulse 79, temperature 98.3 F (36.8 C), temperature source Oral, resp. rate 16, height 5\' 7"  (1.702 m), weight 76.7 kg (169 lb 3.2 oz), SpO2 98 %. No results found. No results found for this or any previous visit (from the past 72 hour(s)).   HEENT: normal and poor dentition Cardio: RRR and no murmur Resp: CTA B/L and unlabored GI: BS positive and NT, ND Extremity:  No Edema Skin:   Other scalp keratosis Neuro: Confused, Abnormal Sensory appears diminished RUE but difficult to assess due to hearing impairment, Abnormal Motor 3- RUE and RLE, some limitation RLE due to pain, Tone:  Hypertonia and Other HOH, oriented to person not place/time Musc/Skel:  Extremity tender right knee pain with flexion contracture, - effusion Gen NAD   Assessment/Plan: 1. 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 Physiatrist is providing close team supervision and 24 hour management of active medical problems listed below. Physiatrist and rehab team continue to assess barriers to discharge/monitor patient progress toward functional and medical goals. FIM: Function - Bathing Position: Shower Body parts bathed by patient: Right arm, Left arm, Chest, Abdomen, Front perineal area, Buttocks, Right upper leg, Left upper leg, Right lower leg, Left lower leg Body parts bathed by helper: Back Assist Level: Touching or steadying assistance(Pt > 75%)  Function- Upper Body Dressing/Undressing What is the patient wearing?: Pull over shirt/dress Pull over shirt/dress - Perfomed by patient: Thread/unthread right sleeve, Thread/unthread left sleeve, Put head through opening, Pull shirt over trunk Pull over shirt/dress - Perfomed by helper: Thread/unthread right  sleeve, Thread/unthread left sleeve, Put head through opening Assist Level: Supervision or verbal cues, More than reasonable time, Set up Set up : To obtain clothing/put away Function - Lower Body Dressing/Undressing What is the patient wearing?: Underwear, Pants, Non-skid slipper socks Position: Wheelchair/chair at sink Underwear - Performed by patient: Thread/unthread right underwear leg, Thread/unthread left underwear leg, Pull underwear up/down Pants- Performed by patient: Thread/unthread right pants leg, Thread/unthread left pants leg, Pull pants up/down, Fasten/unfasten pants Pants- Performed by helper: Thread/unthread left pants leg, Pull pants up/down Non-skid slipper socks- Performed by patient: Don/doff right sock, Don/doff left sock Socks - Performed by helper: Don/doff right sock, Don/doff left sock Shoes - Performed by helper: Don/doff right shoe, Don/doff left shoe, Fasten right, Fasten left Assist for lower body dressing: Touching or steadying assistance (Pt > 75%)  Function - Toileting Toileting steps completed by helper: Adjust clothing prior to toileting, Performs perineal hygiene, Adjust clothing after toileting Toileting Assistive Devices: Grab bar or rail Assist level: Two helpers  Function - Air cabin crew transfer assistive device: Radio broadcast assistant lift: Stedy Assist level to toilet: Moderate assist (Pt 50 - 74%/lift or lower) Assist level from toilet: Moderate assist (Pt 50 - 74%/lift or lower) Assist level to bedside commode (at bedside): Total assist (Pt < 25%) Assist level from bedside commode (at bedside): Total assist (Pt < 25%)  Function - Chair/bed transfer Chair/bed transfer method: Stand pivot Chair/bed transfer assist level: Touching or steadying assistance (Pt > 75%) Chair/bed transfer assistive device: Armrests, Walker  Function - Locomotion: Wheelchair Will patient use wheelchair at discharge?: Yes Type: Manual Max wheelchair  distance: 268ft  Assist Level: Supervision or verbal cues Assist Level: Supervision or verbal cues Wheel 150 feet activity did not occur:  Refused Assist Level: Supervision or verbal cues Turns around,maneuvers to table,bed, and toilet,negotiates 3% grade,maneuvers on rugs and over doorsills: No Function - Locomotion: Ambulation Assistive device: Walker-rolling Max distance: 30 Assist level: Touching or steadying assistance (Pt > 75%) Assist level: Touching or steadying assistance (Pt > 75%) Walk 50 feet with 2 turns activity did not occur: Safety/medical concerns Walk 150 feet activity did not occur: Safety/medical concerns Walk 10 feet on uneven surfaces activity did not occur: Safety/medical concerns  Function - Comprehension Comprehension: Auditory Comprehension assistive device: Hearing aids Comprehension assist level: Understands basic 75 - 89% of the time/ requires cueing 10 - 24% of the time  Function - Expression Expression: Verbal Expression assist level: Expresses basic 75 - 89% of the time/requires cueing 10 - 24% of the time. Needs helper to occlude trach/needs to repeat words.  Function - Social Interaction Social Interaction assist level: Interacts appropriately 75 - 89% of the time - Needs redirection for appropriate language or to initiate interaction.  Function - Problem Solving Problem solving assist level: Solves basic 75 - 89% of the time/requires cueing 10 - 24% of the time  Function - Memory Memory assist level: Recognizes or recalls 75 - 89% of the time/requires cueing 10 - 24% of the time Patient normally able to recall (first 3 days only): Current season, That he or she is in a hospital, Location of own room, Staff names and faces   Medical Problem List and Plan: 1.  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 CIR PT, OT, SLP continues .   per neuro resume ASA recheck CT THurs eval for bleeding prior to resumption of eliquis 2.  DVT Prophylaxis/Anticoagulation: Mechanical: Sequential compression devices, below knee Bilateral lower extremities--start Lovenox tomorrow.  3. Pain Management: tylenol prn. Voltaren gel for knee pain.4. Mood: LCSW to follow for evaluation and support.  5. Neuropsych: This patient not fully capable of making decisions on his own behalf. 6. Skin/Wound Care: routine pressure relief measures 7. Fluids/Electrolytes/Nutrition: Monitor I/O. Check lytes in am.  8. PAF: I To start ASA on Feb 2nd. Eliquis once hemorrhagic transformation absorbed.  Monitor for tachy  Vitals:   04/08/16 1443 04/09/16 0507  BP: 96/60 122/80  Pulse: 96 79  Resp: 16 16  Temp: 98.8 F (37.1 C) 98.3 F (36.8 C)   9. Prediabetes: blood sugars have improved with weight loss and now Hgb A1c-5.0.  10. Blood pressures: Avoid hypotension ---low dose BB resumed for rate control.   11. Chronic knee pain/history of right hip replacement: Add Voltaren gel for relief.Right knee OA   Now has neoprene sleeve for comfort, has knee orthosis for walking  12. Hyponatremia: Question chronic-, will d/c SSRI, would like to avoid fluid restriction if possible                                              13. Iron deficiency anemia: On iron supplement 14. Leukocytosis: Monitor for signs of infection.has had confusion last 2 days -will check UA/UCS 15.  Insomnia with daytime somnolence, will need to establish normal sleep/wake cycle,     16.  Mild anemia will monitor on ASA check Hgb 2/7  17. UTI enterococcus, sens to amp, day #3/7 LOS (Days) 5 A FACE TO FACE EVALUATION WAS PERFORMED  Charlett Blake 04/09/2016, 7:39 AM

## 2016-04-09 NOTE — Progress Notes (Signed)
Speech Language Pathology Daily Session Note  Patient Details  Name: Herbert Marquez MRN: [id number] Date of Birth: [birth year]  Today's Date: 04/09/2016 SLP Individual Time: 0915-1000 SLP Individual Time Calculation (min): 45 min  Short Term Goals: Week 1: SLP Short Term Goal 1 (Week 1): Pt will utilize word finding strategies at the structured phrase level with Min A verbal cues.  SLP Short Term Goal 2 (Week 1): Pt will utilize external memory aids to recall new, daily information with Mod A verbal cues.  SLP Short Term Goal 3 (Week 1): Pt will consistently demonstrate O x 4 with Mod A cues.  SLP Short Term Goal 4 (Week 1): Pt will sustain attention to basic, familiar task for ~ 1 minute intervals with Mod A verbal cues.  SLP Short Term Goal 5 (Week 1): Pt will complete basic, familiar tasks with Mod A verbal cues for functional problem solving.   Skilled Therapeutic Interventions: Skilled ST treatment session focused on speech communication goals. SLP facilitated session by Mod I for sustained attention to task in mildly distracting environment for ~ 40 minutes. Pt was independently recall all orientation information except year. SLP further provided Min A verbal cues for use of visual aides to recall year. Pt was able to express information related to common objects at the sentence level with Min A cues for word recall. Pt also able to verbally order select meal items for lunch and dinner without any word finding deficits. Pt with great progress this session. Pt was left upright in his wheelchair with all needs within reach. Continue per current plan of care.      Function:    Cognition Comprehension Comprehension assist level: Understands basic 90% of the time/cues < 10% of the time  Expression   Expression assist level: Expresses basic 90% of the time/requires cueing < 10% of the time.  Social Interaction Social Interaction assist level: Interacts appropriately 90% of the time - Needs  monitoring or encouragement for participation or interaction.  Problem Solving Problem solving assist level: Solves basic 75 - 89% of the time/requires cueing 10 - 24% of the time  Memory Memory assist level: Recognizes or recalls 75 - 89% of the time/requires cueing 10 - 24% of the time    Pain Pain Assessment Pain Assessment: No/denies pain  Therapy/Group: Individual Therapy  [place] Uher B. Rutherford Nail, M.S., CCC-SLP Speech-Language Pathologist   Lyndall Bellot 04/09/2016, 9:50 AM

## 2016-04-09 NOTE — Progress Notes (Signed)
Occupational Therapy Session Note  Patient Details  Name: ZYEIR BOYTER MRN: [id number] Date of Birth: 13-Jan-1928  Today's Date: 04/09/2016 OT Individual Time: 1102-1200 OT Individual Time Calculation (min): 58 min    Short Term Goals: Week 1:  OT Short Term Goal 1 (Week 1): Pt will complete UB bathing with supervision.  OT Short Term Goal 2 (Week 1): Pt will complete UB dressing with min assist sitting EOB or chair. OT Short Term Goal 3 (Week 1): Pt will complete LB bathing with mod assist sit to stand.  OT Short Term Goal 4 (Week 1): Pt will complete LB dressing with mod assist sit to stand.  OT Short Term Goal 5 (Week 1): Pt will complete toilet transfers with mod assist stand pivot.   Skilled Therapeutic Interventions/Progress Updates:    Pt completed shower, grooming, and dressing during session.  Min assist for transfer from the EOB to the wheelchair and then from the wheelchair to the tub bench with use of the RW.  He was able to complete bathing with min steady assist sit to stand from the tub bench with use of the grab bar.  Min assist for dressing sit to stand, with min assist to tie shoe on the right  foot.  He was able to tie the shoe on the left foot with increased time. Completed functional mobility with min assist using the RW to conclude session of greater than 30 ft.  Pt needing mod instructional cueing to not step too far forward into the walker and to reach back to sit down with control.  Pt left in wheelchair with spouse present at end of session.  Safety belt in place as well.     Therapy Documentation Precautions:  Precautions Precautions: Fall Precaution Comments: RLE flexor tone at the hamstring Required Braces or Orthoses: Cervical Brace Cervical Brace: Soft collar (cervical collar for comfort only) Restrictions Weight Bearing Restrictions: No  Pain: Pain Assessment Pain Assessment: No/denies pain ADL: See Function Navigator for Current Functional  Status.   Therapy/Group: Individual Therapy  Omaree Fuqua OTR/L 04/09/2016, 12:35 PM

## 2016-04-09 NOTE — Progress Notes (Addendum)
Physical Therapy Note  Patient Details  Name: Herbert Marquez MRN: [id number] Date of Birth: [birth year] Today's Date: 04/09/2016    Time: 830-915 45 minutes  1:1 No c/o pain. Session focused on improving activity tolerance through functional tasks. W/c mobility with bilat UEs 100', 150' with supervision, increased time.  Gait 3 x 20' with min A with RW.  Standing tolerance 2 x 5 minutes with clothespin task with pt with increased LE fatigue but no pain.  Multiple squat pivot transfers to bed, mat and toilet all with min A.   Time 2: 1400-1430 30 minutes  1:1 No c/o pain. Pt performed transfers with close supervision to w/c and nustep.  Nu step x 10 minutes level 4 with spO2 88-97%, HR 140 bpm.  RN aware of increased heart rate. Pt with 2 x 1 minute rest breaks during nu step.  Pt continues to be limited by decreased activity tolerance but is motivated to improve.  [name] 04/09/2016, 9:15 AM

## 2016-04-10 ENCOUNTER — Inpatient Hospital Stay (HOSPITAL_COMMUNITY): Payer: Medicare Other | Admitting: Physical Therapy

## 2016-04-10 ENCOUNTER — Telehealth: Payer: Self-pay | Admitting: Adult Health

## 2016-04-10 ENCOUNTER — Inpatient Hospital Stay (HOSPITAL_COMMUNITY): Payer: Medicare Other | Admitting: Occupational Therapy

## 2016-04-10 ENCOUNTER — Inpatient Hospital Stay (HOSPITAL_COMMUNITY): Payer: Medicare Other | Admitting: Speech Pathology

## 2016-04-10 NOTE — Progress Notes (Signed)
Physical Therapy Session Note  Patient Details  Name: Herbert Marquez MRN: [id number] Date of Birth: [birth year]  Today's Date: 04/10/2016 PT Individual Time: 1100-1158 PT Individual Time Calculation (min): 58 min   Short Term Goals: Week 1:  PT Short Term Goal 1 (Week 1): Patient will transfer with mod assist consistently PT Short Term Goal 2 (Week 1): Patient will Ambulate 24ft with mod assist  PT Short Term Goal 3 (Week 1): Patient will ascend/descent 2 steps with Mod assist  PT Short Term Goal 4 (Week 1): Patient will propell WC 171ft with supervision assist  PT Short Term Goal 5 (Week 1): Patient will perform bed mobility with mod assist consistently.   Skilled Therapeutic Interventions/Progress Updates:   Patient in wheelchair upon arrival. Session focused on functional mobility training, standing balance, and endurance. Patient propelled wheelchair using BUE 2 x 150 ft with supervision. Sit to and from stand transfers using RW and arm rests with supervision. Gait training using RW x 20 ft + 24 ft  + 43 ft with close supervision. Stair training up/down 8 (3") stairs using 2 rails with no cues for correct step-to pattern and 1 episode of R knee buckling, min A overall. Standing tolerance during card matching task with single UE support on RW x 2 trials of 1.5 min and 1 min with supervision. Kinetron from wheelchair level at 40 cm/sec, 3 trials of 60 sec each with rest breaks between. Patient requires multiple prolonged seated rest breaks throughout session due to fatigue and shortness of breath, patient demonstrating pursed lip breathing without cues. Patient left sitting in wheelchair with family at bedside and needs in reach.    Therapy Documentation Precautions:  Precautions Precautions: Fall Precaution Comments: RLE flexor tone at the hamstring Required Braces or Orthoses: Cervical Brace Cervical Brace: Soft collar (cervical collar for comfort only) Restrictions Weight Bearing  Restrictions: No Pain: Pain Assessment Pain Assessment: No/denies pain Pain Score: 0-No pain   See Function Navigator for Current Functional Status.   Therapy/Group: Individual Therapy  Arman Loy, Murray Hodgkins 04/10/2016, 12:16 PM

## 2016-04-10 NOTE — Telephone Encounter (Signed)
Thanks Baker Janus! I have been reading the notes from hospital admission.   Sincerely, Valetta Fuller

## 2016-04-10 NOTE — Progress Notes (Signed)
Speech Language Pathology Daily Session Note  Patient Details  Name: Herbert Marquez MRN: [id number] Date of Birth: [birth year]  Today's Date: 04/10/2016 SLP Individual Time: 1530-1600 SLP Individual Time Calculation (min): 30 min  Short Term Goals: Week 1: SLP Short Term Goal 1 (Week 1): Pt will utilize word finding strategies at the structured phrase level with Min A verbal cues.  SLP Short Term Goal 2 (Week 1): Pt will utilize external memory aids to recall new, daily information with Mod A verbal cues.  SLP Short Term Goal 3 (Week 1): Pt will consistently demonstrate O x 4 with Mod A cues.  SLP Short Term Goal 4 (Week 1): Pt will sustain attention to basic, familiar task for ~ 1 minute intervals with Mod A verbal cues.  SLP Short Term Goal 5 (Week 1): Pt will complete basic, familiar tasks with Mod A verbal cues for functional problem solving.   Skilled Therapeutic Interventions: Skilled treatment session focused on cognition goals. SLP facilitated session by providing Min A cues for recall of orientation information. Pt required Min A verbal cues to complete basic task for functional problem solving. Pt with no word finding deficits this session. Great progress made. Pt left upright in wheelchair with wife present and all needs within reach. Continue per current plan of care.      Function:  Eating Eating   Modified Consistency Diet: No Eating Assist Level: Set up assist for;Helper scoops food on utensil;Helper brings food to mouth;More than reasonable amount of time   Eating Set Up Assist For: Opening containers Helper Scoops Food on Utensil: Occasionally Helper Brings Food to Mouth: Occasionally   Cognition Comprehension Comprehension assist level: Understands basic 90% of the time/cues < 10% of the time  Expression   Expression assist level: Expresses basic 90% of the time/requires cueing < 10% of the time.  Social Interaction Social Interaction assist level: Interacts  appropriately with others with medication or extra time (anti-anxiety, antidepressant).  Problem Solving Problem solving assist level: Solves basic 75 - 89% of the time/requires cueing 10 - 24% of the time  Memory Memory assist level: Recognizes or recalls 75 - 89% of the time/requires cueing 10 - 24% of the time    Pain    Therapy/Group: Individual Therapy Patrecia Veiga B. Rutherford Nail, M.S., [place] 04/10/2016, 4:06 PM

## 2016-04-10 NOTE — Telephone Encounter (Signed)
Pt's wife called to inform us that [name] [birth year] was admitted to [facility] for stroke 1/27--glh

## 2016-04-10 NOTE — Progress Notes (Signed)
Occupational Therapy Session Note  Patient Details  Name: Herbert Marquez MRN: [id number] Date of Birth: [birth year]  Today's Date: 04/10/2016 OT Individual Time:  9:18- 10:01  43 min    Short Term Goals: Week 1:  OT Short Term Goal 1 (Week 1): Pt will complete UB bathing with supervision.  OT Short Term Goal 2 (Week 1): Pt will complete UB dressing with min assist sitting EOB or chair. OT Short Term Goal 3 (Week 1): Pt will complete LB bathing with mod assist sit to stand.  OT Short Term Goal 4 (Week 1): Pt will complete LB dressing with mod assist sit to stand.  OT Short Term Goal 5 (Week 1): Pt will complete toilet transfers with mod assist stand pivot.   Skilled Therapeutic Interventions/Progress Updates:    Upon entering room Pt supine in bed with spouse in room. Pt agreeable to tx this date. Focus of session on self care retraining and functional mobility. Pt supine>sit with supervision. Pt sit to stand MIN A for weight shift to R side and upright posture. Pt ambulates with MIN A to TTB for steadying and VC for RW management and step through with R foot. Pt bathes 10/10 body parts seated with CGA for steadying while sit<>stand to wash buttocks. Pt reports fatigue and declines ambulating out of bathroom. While seated in w/c pt dons LB clothing with VC to assume figure four position and CGA while standing to advance pants over hips. Pt req encouragement to use R hand during functional task. Pt req increased time to fasten shoes. Educated pt on energy conservation techniques for LB dressing to don underwear, pant leg, sock and shoe while in figure four as opposed to standing up in between each article of clothing. Pt grooms at sink at w/c level with set up to obtain items. Pt left in room seated in w/c with call light in place. Reminded pt and spouse to use call bell for restroom and getting back in bed.   Therapy Documentation Precautions:  Precautions Precautions: Fall Precaution Comments:  RLE flexor tone at the hamstring Required Braces or Orthoses: Cervical Brace Cervical Brace: Soft collar (cervical collar for comfort only) Restrictions Weight Bearing Restrictions: No    Pain:   Pt did not report pain this session ADL:   See Function Navigator for Current Functional Status.   Therapy/Group: Individual Therapy  Tonny Branch 04/10/2016, 10:09 AM

## 2016-04-10 NOTE — Progress Notes (Signed)
Occupational Therapy Session Note  Patient Details  Name: Herbert Marquez MRN: [id number] Date of Birth: 02/23/1928  Today's Date: 04/10/2016 OT Individual Time: [id number] OT Individual Time Calculation (min): 57 min    Short Term Goals: Week 1:  OT Short Term Goal 1 (Week 1): Pt will complete UB bathing with supervision.  OT Short Term Goal 2 (Week 1): Pt will complete UB dressing with min assist sitting EOB or chair. OT Short Term Goal 3 (Week 1): Pt will complete LB bathing with mod assist sit to stand.  OT Short Term Goal 4 (Week 1): Pt will complete LB dressing with mod assist sit to stand.  OT Short Term Goal 5 (Week 1): Pt will complete toilet transfers with mod assist stand pivot.   Skilled Therapeutic Interventions/Progress Updates:    Pt worked on Counsellor transfers using the [place] and tub bench as he has at home.  Unable to setup exactly as pt has with grab bars but instead had him use the RW for stand pivot transfer with min assist.  He also completed transfer with the RW to the lower sofa and to the elevated bed, both with min assist.  Pt continues to need mod assist for positioning of RW with transition to sitting as he does not consistently square his body to the surface and reach back with both hands.  When reaching back with the LUE he still does not control descent and flops down to the surface.  This improved toward the end of session.  Second part of session focused on neuromuscular re-education with use of the RUE for flipping cards one at a time at table top and picking up small pieces of foam one at a time initially, progressing to picking up more than one, with in hand manipulation to move it from fingertips to palm and back.  He was able to complete all FM tasks but demonstrated ataxic movement when manipulating cards as well as occasional drops 35% of the time when attempting to transition foam pieces from fingertip to palm and back.  Issued foam pieces for work  on his own in the room.  Also issued red foam grips for use over utensils when self feeding.  Finished session with return to the room and transfer to the bed with min assist.  Pt left with spouse and call button in reach.    Therapy Documentation Precautions:  Precautions Precautions: Fall Precaution Comments: RLE flexor tone at the hamstring Required Braces or Orthoses: Cervical Brace Cervical Brace: Soft collar (cervical collar for comfort only) Restrictions Weight Bearing Restrictions: No  Pain: Pain Assessment Pain Assessment: No/denies pain ADL: See Function Navigator for Current Functional Status.   Therapy/Group: Individual Therapy  Augusten Lipkin OTR/L 04/10/2016, 4:18 PM

## 2016-04-10 NOTE — Progress Notes (Signed)
Subjective/Complaints: Wife at bedside, pt slept well last noc, BM x 2 yesterday  ROS- negative CP, SOB, N/V/D  Objective: Vital Signs: Blood pressure 121/73, pulse 74, temperature 98 F (36.7 C), temperature source Oral, resp. rate 16, height _0  (1.702 m), weight 76.7 kg (169 lb 3.2 oz), SpO2 99 %. No results found. Results for orders placed or performed during the hospital encounter of 04/04/16 (from the past 72 hour(s))  Basic metabolic panel     Status: Abnormal   Collection Time: 04/09/16  7:05 AM  Result Value Ref Range   Sodium 131 (L) 135 - 145 mmol/L   Potassium 3.8 3.5 - 5.1 mmol/L   Chloride 102 101 - 111 mmol/L   CO2 25 22 - 32 mmol/L   Glucose, Bld 98 65 - 99 mg/dL   BUN 10 6 - 20 mg/dL   Creatinine, Ser 0.90 0.61 - 1.24 mg/dL   Calcium 8.7 (L) 8.9 - 10.3 mg/dL   GFR calc non Af Amer >60 >60 mL/min   GFR calc Af Amer >60 >60 mL/min    Comment: (NOTE) The eGFR has been calculated using the CKD EPI equation. This calculation has not been validated in all clinical situations. eGFR's persistently <60 mL/min signify possible Chronic Kidney Disease.    Anion gap 4 (L) 5 - 15     HEENT: normal and poor dentition Cardio: RRR and no murmur Resp: CTA B/L and unlabored GI: BS positive and NT, ND Extremity:  No Edema Skin:   Other scalp keratosis Neuro: Confused, Abnormal Sensory appears diminished RUE but difficult to assess due to hearing impairment, Abnormal Motor 3- RUE and RLE, some limitation RLE due to pain, Tone:  Hypertonia and Other HOH, oriented to person not place/time Musc/Skel:  Extremity tender right knee pain with flexion contracture, - effusion Gen NAD   Assessment/Plan: 1. 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 Physiatrist is providing close team supervision and 24 hour management of active  medical problems listed below. Physiatrist and rehab team continue to assess barriers to discharge/monitor patient progress toward functional and medical goals. FIM: Function - Bathing Position: Shower Body parts bathed by patient: Right arm, Left arm, Chest, Abdomen, Front perineal area, Buttocks, Right upper leg, Left upper leg, Right lower leg, Left lower leg Body parts bathed by helper: Back Assist Level: Touching or steadying assistance(Pt > 75%)  Function- Upper Body Dressing/Undressing What is the patient wearing?: Pull over shirt/dress Pull over shirt/dress - Perfomed by patient: Thread/unthread right sleeve, Thread/unthread left sleeve, Put head through opening, Pull shirt over trunk Pull over shirt/dress - Perfomed by helper: Thread/unthread right sleeve, Thread/unthread left sleeve, Put head through opening Assist Level: Supervision or verbal cues, More than reasonable time, Set up Set up : To obtain clothing/put away Function - Lower Body Dressing/Undressing What is the patient wearing?: Underwear, Pants, Shoes, Socks Position: Wheelchair/chair at sink Underwear - Performed by patient: Thread/unthread right underwear leg, Thread/unthread left underwear leg, Pull underwear up/down Pants- Performed by patient: Thread/unthread right pants leg, Thread/unthread left pants leg, Pull pants up/down, Fasten/unfasten pants Pants- Performed by helper: Thread/unthread left pants leg, Pull pants up/down Non-skid slipper socks- Performed by patient: Don/doff right sock, Don/doff left sock Socks - Performed by patient: Don/doff right sock, Don/doff left sock Socks - Performed by helper: Don/doff right sock, Don/doff left sock Shoes - Performed by helper: Don/doff right  shoe, Don/doff left shoe, Fasten left Assist for lower body dressing: Touching or steadying assistance (Pt > 75%)  Function - Toileting Toileting steps completed by helper: Adjust clothing prior to toileting, Performs perineal  hygiene, Adjust clothing after toileting Toileting Assistive Devices: Grab bar or rail Assist level: Two helpers  Function - Air cabin crew transfer assistive device: Grab bar, Facilities manager lift: Stedy Assist level to toilet: Total assist (Pt < 25%) Assist level from toilet: Total assist (Pt < 25%) Assist level to bedside commode (at bedside): Total assist (Pt < 25%) Assist level from bedside commode (at bedside): Total assist (Pt < 25%)  Function - Chair/bed transfer Chair/bed transfer method: Stand pivot Chair/bed transfer assist level: Touching or steadying assistance (Pt > 75%) Chair/bed transfer assistive device: Armrests, Walker  Function - Locomotion: Wheelchair Will patient use wheelchair at discharge?: Yes Type: Manual Max wheelchair distance: 223f  Assist Level: Supervision or verbal cues Assist Level: Supervision or verbal cues Wheel 150 feet activity did not occur: Refused Assist Level: Supervision or verbal cues Turns around,maneuvers to table,bed, and toilet,negotiates 3% grade,maneuvers on rugs and over doorsills: No Function - Locomotion: Ambulation Assistive device: Walker-rolling Max distance: 30 Assist level: Touching or steadying assistance (Pt > 75%) Assist level: Touching or steadying assistance (Pt > 75%) Walk 50 feet with 2 turns activity did not occur: Safety/medical concerns Walk 150 feet activity did not occur: Safety/medical concerns Walk 10 feet on uneven surfaces activity did not occur: Safety/medical concerns  Function - Comprehension Comprehension: Auditory Comprehension assistive device: Hearing aids Comprehension assist level: Understands basic 90% of the time/cues < 10% of the time  Function - Expression Expression: Verbal Expression assist level: Expresses basic 90% of the time/requires cueing < 10% of the time.  Function - Social Interaction Social Interaction assist level: Interacts appropriately 90% of the  time - Needs monitoring or encouragement for participation or interaction.  Function - Problem Solving Problem solving assist level: Solves basic 75 - 89% of the time/requires cueing 10 - 24% of the time  Function - Memory Memory assist level: Recognizes or recalls 75 - 89% of the time/requires cueing 10 - 24% of the time Patient normally able to recall (first 3 days only): Current season, That he or she is in a hospital, Location of own room, Staff names and faces   Medical Problem List and Plan: 1.  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 CIR PT, OT, SLP continues .  per neuro resume ASA recheck CT THurs eval for bleeding prior to resumption of eliquis 2.  DVT Prophylaxis/Anticoagulation: Mechanical: Sequential compression devices, below knee Bilateral lower extremities--start Lovenox tomorrow.  3. Pain Management: tylenol prn. Voltaren gel for knee pain.4. Mood: LCSW to follow for evaluation and support.  5. Neuropsych: This patient not fully capable of making decisions on his own behalf. 6. Skin/Wound Care: routine pressure relief measures 7. Fluids/Electrolytes/Nutrition: Monitor I/O. BMET ok no sign of pre renal azotemia 8. PAF: I To start ASA on Feb 2nd. Eliquis once hemorrhagic transformation absorbed.  Monitor for tachy  Vitals:   04/09/16 1404 04/10/16 0453  BP:  121/73  Pulse: (!) 121 74  Resp:  16  Temp:  98 F (36.7 C)   9. Prediabetes: blood sugars have improved with weight loss and now Hgb A1c-5.0.  10. Blood pressures: Avoid hypotension ---BB stopped, one episode of tachy 11. Chronic knee pain/history of right  hip replacement: Add Voltaren gel for relief.Right knee OA   Now has neoprene sleeve for comfort, has knee orthosis for walking  12. Hyponatremia: Question chronic-, will d/c SSRI, would like to avoid fluid restriction if possible                                               13. Iron deficiency anemia: On iron supplement 14. Leukocytosis: Monitor for signs of infection.has had confusion last 2 days -will check UA/UCS 15.  Insomnia with daytime somnolence, will need to establish normal sleep/wake cycle,     16.  Mild anemia will monitor on ASA check Hgb 2/7  17. UTI enterococcus, sens to amp, day #4/7 LOS (Days) 6 A FACE TO FACE EVALUATION WAS PERFORMED  Monalisa Bayless E 04/10/2016, 7:39 AM

## 2016-04-11 ENCOUNTER — Inpatient Hospital Stay (HOSPITAL_COMMUNITY): Payer: Medicare Other | Admitting: Physical Therapy

## 2016-04-11 ENCOUNTER — Inpatient Hospital Stay (HOSPITAL_COMMUNITY): Payer: Medicare Other | Admitting: Occupational Therapy

## 2016-04-11 ENCOUNTER — Inpatient Hospital Stay (HOSPITAL_COMMUNITY): Payer: Medicare Other | Admitting: Speech Pathology

## 2016-04-11 DIAGNOSIS — R269 Unspecified abnormalities of gait and mobility: Secondary | ICD-10-CM

## 2016-04-11 DIAGNOSIS — I69398 Other sequelae of cerebral infarction: Secondary | ICD-10-CM

## 2016-04-11 LAB — CBC
HCT: 37.2 % — ABNORMAL LOW (ref 39.0–52.0)
Hemoglobin: 11.9 g/dL — ABNORMAL LOW (ref 13.0–17.0)
MCH: 29.8 pg (ref 26.0–34.0)
MCHC: 32 g/dL (ref 30.0–36.0)
MCV: 93 fL (ref 78.0–100.0)
Platelets: 572 10*3/uL — ABNORMAL HIGH (ref 150–400)
RBC: 4 MIL/uL — ABNORMAL LOW (ref 4.22–5.81)
RDW: 14.4 % (ref 11.5–15.5)
WBC: 9.1 10*3/uL (ref 4.0–10.5)

## 2016-04-11 NOTE — Progress Notes (Signed)
Subjective/Complaints: Good appetite. Participating with occupational therapy this morning. Poor sleep due to frequent urination.  ROS- negative CP, SOB, N/V/D  Objective: Vital Signs: Blood pressure 104/65, pulse 70, temperature 98.4 F (36.9 C), temperature source Oral, resp. rate 16, height 5' 7"  (1.702 m), weight 76.7 kg (169 lb 3.2 oz), SpO2 98 %. No results found. Results for orders placed or performed during the hospital encounter of 04/04/16 (from the past 72 hour(s))  Basic metabolic panel     Status: Abnormal   Collection Time: 04/09/16  7:05 AM  Result Value Ref Range   Sodium 131 (L) 135 - 145 mmol/L   Potassium 3.8 3.5 - 5.1 mmol/L   Chloride 102 101 - 111 mmol/L   CO2 25 22 - 32 mmol/L   Glucose, Bld 98 65 - 99 mg/dL   BUN 10 6 - 20 mg/dL   Creatinine, Ser 0.90 0.61 - 1.24 mg/dL   Calcium 8.7 (L) 8.9 - 10.3 mg/dL   GFR calc non Af Amer >60 >60 mL/min   GFR calc Af Amer >60 >60 mL/min    Comment: (NOTE) The eGFR has been calculated using the CKD EPI equation. This calculation has not been validated in all clinical situations. eGFR's persistently <60 mL/min signify possible Chronic Kidney Disease.    Anion gap 4 (L) 5 - 15  CBC     Status: Abnormal   Collection Time: 04/11/16  8:28 AM  Result Value Ref Range   WBC 9.1 4.0 - 10.5 K/uL   RBC 4.00 (L) 4.22 - 5.81 MIL/uL   Hemoglobin 11.9 (L) 13.0 - 17.0 g/dL   HCT 37.2 (L) 39.0 - 52.0 %   MCV 93.0 78.0 - 100.0 fL   MCH 29.8 26.0 - 34.0 pg   MCHC 32.0 30.0 - 36.0 g/dL   RDW 14.4 11.5 - 15.5 %   Platelets 572 (H) 150 - 400 K/uL     HEENT: normal and poor dentition Cardio: RRR and no murmur Resp: CTA B/L and unlabored GI: BS positive and NT, ND Extremity:  No Edema Skin:   Other scalp keratosis Neuro: Confused, Abnormal Sensory appears diminished RUE but difficult to assess due to hearing impairment, Abnormal Motor 3- RUE and RLE, some limitation RLE due to pain, Tone:  Hypertonia and Other HOH, oriented  to person not place/time Musc/Skel:  Extremity tender right knee pain with flexion contracture, - effusion Gen NAD   Assessment/Plan: 1. 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 Physiatrist is providing close team supervision and 24 hour management of active medical problems listed below. Physiatrist and rehab team continue to assess barriers to discharge/monitor patient progress toward functional and medical goals. FIM: Function - Bathing Position: Shower Body parts bathed by patient: Right arm, Left arm, Chest, Abdomen, Front perineal area, Buttocks, Right upper leg, Left upper leg, Right lower leg, Left lower leg Body parts bathed by helper: Back Assist Level: Touching or steadying assistance(Pt > 75%)  Function- Upper Body Dressing/Undressing What is the patient wearing?: Pull over shirt/dress Pull over shirt/dress - Perfomed by patient: Thread/unthread right sleeve, Thread/unthread left sleeve, Put head through opening, Pull shirt over trunk Pull over shirt/dress - Perfomed by helper: Thread/unthread right sleeve, Thread/unthread left sleeve, Put head through opening Assist Level: Set up Set up : To obtain clothing/put away Function - Lower Body Dressing/Undressing What is the patient wearing?: Underwear, Pants,  Shoes, Socks Position: Education officer, museum at [employer] - Performed by patient: Thread/unthread right underwear leg, Thread/unthread left underwear leg, Pull underwear up/down Pants- Performed by patient: Thread/unthread right pants leg, Thread/unthread left pants leg, Pull pants up/down, Fasten/unfasten pants Pants- Performed by helper: Thread/unthread left pants leg, Pull pants up/down Non-skid slipper socks- Performed by patient: Don/doff right sock, Don/doff left sock Socks - Performed by patient: Don/doff right sock,  Don/doff left sock Socks - Performed by helper: Don/doff right sock, Don/doff left sock Shoes - Performed by patient: Don/doff right shoe, Don/doff left shoe, Fasten right Shoes - Performed by helper: Fasten left Assist for lower body dressing: Touching or steadying assistance (Pt > 75%)  Function - Toileting Toileting steps completed by helper: Adjust clothing prior to toileting, Performs perineal hygiene, Adjust clothing after toileting Toileting Assistive Devices: Grab bar or rail Assist level: Two helpers  Function - Air cabin crew transfer assistive device: Grab bar, Facilities manager lift: Stedy Assist level to toilet: Total assist (Pt < 25%) Assist level from toilet: Total assist (Pt < 25%) Assist level to bedside commode (at bedside): Total assist (Pt < 25%) Assist level from bedside commode (at bedside): Total assist (Pt < 25%)  Function - Chair/bed transfer Chair/bed transfer method: Ambulatory Chair/bed transfer assist level: Touching or steadying assistance (Pt > 75%) Chair/bed transfer assistive device: Armrests, Walker  Function - Locomotion: Wheelchair Will patient use wheelchair at discharge?: Yes Type: Manual Max wheelchair distance: 150 ft Assist Level: Supervision or verbal cues Assist Level: Supervision or verbal cues Wheel 150 feet activity did not occur: Refused Assist Level: Supervision or verbal cues Turns around,maneuvers to table,bed, and toilet,negotiates 3% grade,maneuvers on rugs and over doorsills: No Function - Locomotion: Ambulation Assistive device: Walker-rolling Max distance: 43 ft Assist level: Supervision or verbal cues Assist level: Supervision or verbal cues Walk 50 feet with 2 turns activity did not occur: Safety/medical concerns Walk 150 feet activity did not occur: Safety/medical concerns Walk 10 feet on uneven surfaces activity did not occur: Safety/medical concerns  Function - Comprehension Comprehension:  Auditory Comprehension assistive device: Hearing aids Comprehension assist level: Understands basic 90% of the time/cues < 10% of the time  Function - Expression Expression: Verbal Expression assist level: Expresses basic 90% of the time/requires cueing < 10% of the time.  Function - Social Interaction Social Interaction assist level: Interacts appropriately with others with medication or extra time (anti-anxiety, antidepressant).  Function - Problem Solving Problem solving assist level: Solves basic 75 - 89% of the time/requires cueing 10 - 24% of the time  Function - Memory Memory assist level: Recognizes or recalls 75 - 89% of the time/requires cueing 10 - 24% of the time Patient normally able to recall (first 3 days only): Current season, That he or she is in a hospital, Location of own room, Staff names and faces   Medical Problem List and Plan: 1.  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 CIR PT, OT, SLP continues .  per neuro resume ASA recheck CT tomorrow eval for bleeding prior to resumption of eliquis Team conference today please see physician documentation under team conference tab, met with team face-to-face to discuss problems,progress, and goals. Formulized individual treatment plan based on medical history, underlying problem and comorbidities. 2.  DVT Prophylaxis/Anticoagulation: Mechanical: Sequential compression devices, below knee Bilateral lower extremities--start Lovenox tomorrow.  3. Pain Management: tylenol prn. Voltaren gel for knee pain.4. Mood:  LCSW to follow for evaluation and support.  5. Neuropsych: This patient not fully capable of making decisions on his own behalf. 6. Skin/Wound Care: routine pressure relief measures 7. Fluids/Electrolytes/Nutrition: Monitor I/O. BMET ok no sign of pre renal azotemia, recorded intake 75 -100% of meals 8.  PAF: I To start ASA on Feb 2nd. Eliquis once hemorrhagic transformation absorbed.  Monitor for tachy  Vitals:   04/10/16 1556 04/11/16 0505  BP: 97/82 104/65  Pulse: 76 70  Resp: 17 16  Temp: 98.2 F (36.8 C) 98.4 F (36.9 C)   9. Prediabetes: blood sugars have improved with weight loss and now Hgb A1c-5.0.  10. Blood pressures: Avoid hypotension ---BB stopped, one episode of tachy 11. Chronic knee pain/history of right hip replacement: Add Voltaren gel for relief.Right knee OA   Now has neoprene sleeve for comfort, has knee orthosis for walking  12. Hyponatremia: Question chronic-, will d/c SSRI, would like to avoid fluid restriction if possible                                              13. Iron deficiency anemia: On iron supplement 14. Leukocytosis: Monitor for signs of infection.has had confusion last 2 days -will check UA/UCS 15.  Insomnia with daytime somnolence, will need to establish normal sleep/wake cycle,     16.  Mild anemia will monitor on ASA, Hgb 2/7  inching upward, 11.9 17. UTI enterococcus, sens to amp, day #5/7 LOS (Days) 7 A FACE TO FACE EVALUATION WAS PERFORMED  Jasan Doughtie E 04/11/2016, 9:30 AM

## 2016-04-11 NOTE — Progress Notes (Signed)
Social Work Patient ID: Herbert Marquez, male   DOB: 08-Aug-1927, 83 y.o.   MRN: [id number]  Met with pt and wife to inform team conference goals supervision level and discharge 2/10. Pt has done remarkably well and is making good progress and wife can see this also. Discussed follow up at discharge, both want home health follow up then transition to OP. Prefer [facility] since had before. Will make referral and work toward discharge 2/10.

## 2016-04-11 NOTE — Plan of Care (Signed)
Problem: RH BOWEL ELIMINATION Goal: RH STG MANAGE BOWEL WITH ASSISTANCE STG Manage Bowel with Mod Assistance.   Outcome: Progressing Pt will continue to be continent and require less assistance and cues for bowel management.

## 2016-04-11 NOTE — Progress Notes (Signed)
Occupational Therapy Session Note  Patient Details  Name: Herbert Marquez MRN: [id number] Date of Birth: 1927-10-08  Today's Date: 04/11/2016 OT Individual Time: [id number] OT Individual Time Calculation (min): 58 min    Short Term Goals: Week 1:  OT Short Term Goal 1 (Week 1): Pt will complete UB bathing with supervision.  OT Short Term Goal 2 (Week 1): Pt will complete UB dressing with min assist sitting EOB or chair. OT Short Term Goal 3 (Week 1): Pt will complete LB bathing with mod assist sit to stand.  OT Short Term Goal 4 (Week 1): Pt will complete LB dressing with mod assist sit to stand.  OT Short Term Goal 5 (Week 1): Pt will complete toilet transfers with mod assist stand pivot.   Skilled Therapeutic Interventions/Progress Updates:    Pt completed shower and dressing during session.  Min steady assist for transfer to the shower using the RW for support.  He was able to complete all bathing sit to stand with min assist as well with use of the grab bar for support.  Dressing completed at min assist sit to stand at the sink, including tying both shoes.  Min instructional cueing to include using the RUE slightly more with pulling pants over hips as well as removing clothing prior to shower.  Finished session with RUE coordination task of picking up small pieces of foam with tip to tip pinch and placing them in a cup.  He needed one rest break to complete this secondary to fatigue.  Pt left in wheelchair at end of session with call button in reach and spouse present in the room.    Therapy Documentation Precautions:  Precautions Precautions: Fall Precaution Comments: RLE flexor tone at the hamstring Required Braces or Orthoses: Cervical Brace Cervical Brace: Soft collar (cervical collar for comfort only) Restrictions Weight Bearing Restrictions: No  Pain: Pain Assessment Pain Assessment: No/denies pain ADL: See Function Navigator for Current Functional Status.   Therapy/Group:  Individual Therapy  Mandela Bello OTR/L 04/11/2016, 8:58 AM

## 2016-04-11 NOTE — Patient Care Conference (Signed)
Inpatient RehabilitationTeam Conference and Plan of Care Update Date: 04/11/2016   Time: 10:55 AM    Patient Name: Herbert Marquez      Medical Record Number: [id number]  Date of Birth: 02-25-1928 Sex: Male         Room/Bed: 4W20C/4W20C-01 Payor Info: Payor: MEDICARE / Plan: MEDICARE PART A AND B / Product Type: *No Product type* /    Admitting Diagnosis: L CVA  Admit Date/Time:  04/04/2016  5:21 PM Admission Comments: No comment available   Primary Diagnosis:  Gait disturbance, post-stroke Principal Problem: Gait disturbance, post-stroke  Patient Active Problem List   Diagnosis Date Noted  . Iron deficiency anemia 04/04/2016  . Stroke (cerebrum) ([place]) 04/04/2016  . Prediabetes   . Chronic pain of right knee   . Cerebral hemorrhage (HCC) w/ SDH s/p IV tPA   . Coronary artery disease involving native coronary artery of native heart without angina pectoris   . Paroxysmal atrial fibrillation with RVR ([place])   . Labile blood pressure   . Hypotension   . Controlled type 2 DM with peripheral circulatory disorder ([place])   . History of right hip replacement   . Acute blood loss anemia   . Acute ischemic stroke ([place]) - L temporal lobe s/p tPA 03/30/2016  . Persistent atrial fibrillation ([place])   . Displaced intertrochanteric fracture of right femur, initial encounter for closed fracture ([place]) 11/26/2015  . Protein-calorie malnutrition, severe 11/26/2015  . Closed right hip fracture ([place]) 11/25/2015  . BPH (benign prostatic hyperplasia) 11/25/2015  . GERD (gastroesophageal reflux disease) 11/25/2015  . CKD (chronic kidney disease), stage II 11/25/2015  . Closed left hip fracture ([place]) 11/25/2015  . Citrobacter infection   . Controlled diabetes mellitus type 2 with complications ([place])   . Urinary frequency   . Sepsis ([place]) 11/05/2015  . Overactive bladder   . Sepsis, unspecified organism ([place]) 11/04/2015  . H/O: CVA (cerebrovascular accident) 11/04/2015  . Palpitations 09/18/2015  .  Cerebrovascular accident (CVA) due to bilateral occlusion of posterior cerebral arteries ([place]) 09/18/2015  . DM type 2 with diabetic peripheral neuropathy ([place])   . Gait disturbance, post-stroke 06/23/2015  . 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 Alterations of sensations following CVA (cerebrovascular accident) 06/23/2015  . Thalamic infarction ([place]) 06/21/2015  . Sinus tachycardia 06/20/2015  . Benign essential HTN   . Type 2 diabetes mellitus with complication, without long-term current use of insulin ([place])   . CAD in native artery   . Dysphagia as late effect of cerebrovascular disease   . Leukocytosis   . Hypokalemia   . Hyponatremia   . AKI (acute kidney injury) ([place])   . PSVT (paroxysmal supraventricular tachycardia) ([place])   . HLD (hyperlipidemia)   . Syncope 06/16/2015  . UTI (lower urinary tract infection) 08/31/2011  . Fracture of femoral neck, left ([place]) 03/09/2011  . Obesity 03/09/2011  . COPD (chronic obstructive pulmonary disease) ([place]) 03/09/2011    Expected Discharge Date: Expected Discharge Date: 04/14/16  Team Members Present: Physician leading conference: Dr. Alysia Penna Social Worker Present: Ovidio Kin, LCSW Nurse Present: Dorthula Nettles, RN PT Present: Barrie Folk, PT OT Present: Clyda Greener, OT SLP Present: Gunnar Fusi, SLP PPS Coordinator present : Daiva Nakayama, RN, CRRN     Current Status/Progress Goal Weekly Team Focus  Medical   Intake, improving, anemia, improving  Prevent secondary consultations of CVA as well as recurrent CVA  Continue monitoring of hemoglobin and basic metabolic package, medication adjustments  Bowel/Bladder    (Pt had BM 2/6.  )  Regular, formed bowel movements once a day.  Adequate activfity levels, hydration, fiber and monitoring of bowel patterns.   Swallow/Nutrition/ Hydration   Cardiac Diet w/1500 fluid restriction. and thin liquids.  No aspiration, good intake of nutrients and fluids.  Maintaining  healthy weight and nutritional intake.   ADL's   Supervision for UB selfcare in sitting with min assist for all LB selfcare sit to stand.  Min to min guard assist for transfers to the shower with use of the RW.  Decreased coordination in the RUE as well as increased ataxia.  Needs assist with tying shoes   supervision overall  selfcare retraining, balance retraining, transfer training, neuromuscular reeducation, therapeutic exercise, DME education, pt/family education   Mobility   Min-supervision assist with transfers, bed mobility, and gait with RW for house hold distances.   supervision assist overall with LRAD   Improved endurance, balance, R neuromotor control, and safety with gait.    Communication   Mod A  Min A  utilizing word finding strategies, communicating wants and needs   Safety/Cognition/ Behavioral Observations  Mod A  Min A  Sustained attention, basic problem solving, orientation   Pain             Skin   Skin intact, bruising to side, slight swelling to right knee. No breakdown.  Skin to stay intact, no breakdown in perineal area or bony prominences.  frequent repositioning by pt, assisted as needed by staff, increasing activity and strength through therapy, good nutrition.    Rehab Goals Patient on target to meet rehab goals: Yes *See Care Plan and progress notes for long and short-term goals.  Barriers to Discharge: Wife is elderly. He cannot provide much physical assistance    Possible Resolutions to Barriers:  Continue neuromuscular reeducation.    Discharge Planning/Teaching Needs:  Wife would like to take pt home but needs to be able to manage him. She can do supervision level and not much if any physical care      Team Discussion:  Goals supervision and PT upgraded goals to supervision level. Cognitive issues close to baseline now. Once UTI treated and got some sleep, felt like a new man. Wife is here daily and observes in therapies. Medically stable for DC  Sat.  Revisions to Treatment Plan:  Upgraded goals supervision level. DC 2/10   Continued Need for Acute Rehabilitation Level of Care: The patient requires daily medical management by a physician with specialized training in physical medicine and rehabilitation for the following conditions: Daily direction of a multidisciplinary physical rehabilitation program to ensure safe treatment while eliciting the highest outcome that is of practical value to the patient.: Yes Daily medical management of patient stability for increased activity during participation in an intensive rehabilitation regime.: Yes Daily analysis of laboratory values and/or radiology reports with any subsequent need for medication adjustment of medical intervention for : Blood pressure problems;Neurological problems;Mood/behavior problems  Elease Hashimoto 04/12/2016, 8:38 AM

## 2016-04-11 NOTE — Plan of Care (Signed)
Problem: RH SKIN INTEGRITY Goal: RH STG SKIN FREE OF INFECTION/BREAKDOWN Skin to remain free from infection and breakdown while on rehab with Mod I assistance. Promote assistance to repositioning and boosting.  Outcome: Progressing Pt to freely reposition himself and to eat and drink medications and nutrients in a manner compliant with dietary and medical orders.

## 2016-04-11 NOTE — Progress Notes (Signed)
Speech Language Pathology Daily Session Note  Patient Details  Name: Herbert Marquez MRN: [id number] Date of Birth: 1927/06/15  Today's Date: 04/11/2016 SLP Individual Time: 0900-0930 SLP Individual Time Calculation (min): 30 min  Short Term Goals: Week 1: SLP Short Term Goal 1 (Week 1): Pt will utilize word finding strategies at the structured phrase level with Min A verbal cues.  SLP Short Term Goal 2 (Week 1): Pt will utilize external memory aids to recall new, daily information with Mod A verbal cues.  SLP Short Term Goal 3 (Week 1): Pt will consistently demonstrate O x 4 with Mod A cues.  SLP Short Term Goal 4 (Week 1): Pt will sustain attention to basic, familiar task for ~ 1 minute intervals with Mod A verbal cues.  SLP Short Term Goal 5 (Week 1): Pt will complete basic, familiar tasks with Mod A verbal cues for functional problem solving.   Skilled Therapeutic Interventions: Skilled treatment session focused on addressing cognitive-linguistic goals. SLP facilitated session by providing Min assist question cues for recall of orientation information. SLP educated patient on description as a means compensating for word finding.  During a structured word description task patient required Min assist question cues to include specific details in description.  Patient with no obvious signs of word finding difficulty during unstructured tasks today.  Continue with current plan of care.    Function:   Cognition Comprehension Comprehension assist level: Understands basic 90% of the time/cues < 10% of the time  Expression   Expression assist level: Expresses basic 90% of the time/requires cueing < 10% of the time.  Social Interaction Social Interaction assist level: Interacts appropriately with others with medication or extra time (anti-anxiety, antidepressant).  Problem Solving Problem solving assist level: Solves basic 75 - 89% of the time/requires cueing 10 - 24% of the time  Memory Memory  assist level: Recognizes or recalls 75 - 89% of the time/requires cueing 10 - 24% of the time    Pain Pain Assessment Pain Assessment: No/denies pain  Therapy/Group: Individual Therapy  Carmelia Roller., [place] [id number]  [place] 04/11/2016, 5:14 PM

## 2016-04-11 NOTE — Progress Notes (Signed)
Physical Therapy Session Note  Patient Details  Name: Herbert Marquez MRN: [id number] Date of Birth: 24-Jun-1927  Today's Date: 04/11/2016 PT Individual Time: 4818-5909 PT Individual Time Calculation (min): 72 min   Short Term Goals: Week 1:  PT Short Term Goal 1 (Week 1): Patient will transfer with mod assist consistently PT Short Term Goal 2 (Week 1): Patient will Ambulate 74f with mod assist  PT Short Term Goal 3 (Week 1): Patient will ascend/descent 2 steps with Mod assist  PT Short Term Goal 4 (Week 1): Patient will propell WC 1528fwith supervision assist  PT Short Term Goal 5 (Week 1): Patient will perform bed mobility with mod assist consistently.   Skilled Therapeutic Interventions/Progress Updates:   Pt received sitting in WC and agreeable to PT  WC mobility to gym with BUE propulsion x [id number] assist from PT for encouragement for improved participation and mild cues for doorway managment.   Gait training for 3 bouts, 50, 60, and 40f45fspectively with RW and close supervision assist from PT. Min cues from PT for AD management and proper LE placement/step pattern for improved safety with turns and retro-gait to WC.   Standing balance Nueromuscular re-ed Toe taps on 4 inch steps with BUE support  On RW. Tactile cues for improved RLE knee stability with one instance of knee instability due to pain. Patient able to prevent LOB without increased assist from PT.  Standing on wedge x 2 bouts each on R and L for fine motor task of picking up animal figure then performing Cross body reach to place in cup. 1 UE throughout balance task on RW with close supervision when reaching with the L and min assist when reaching with the R.    Stand pivot transfer with RW to bed [facility] Centerlowed by sit>supine transfer with supervision assist from PT as well as min cues for safety and RLE management. .   Throughout treatment patient required multiple therapeutic rest breaks due to fatigue.     Patient left supine in bed with call bell in reach and all needs met.        Therapy Documentation Precautions:  Precautions Precautions: Fall Precaution Comments: RLE flexor tone at the hamstring Required Braces or Orthoses: Cervical Brace Cervical Brace: Soft collar (cervical collar for comfort only) Restrictions Weight Bearing Restrictions: No    Vital Signs: Therapy Vitals Temp: 98.1 F (36.7 C) Temp Source: Oral Pulse Rate: 86 Resp: 17 BP: 113/70 Patient Position (if appropriate): Sitting Oxygen Therapy SpO2: 100 % O2 Device: Not Delivered Pain:   0/10 at rest.    See Function Navigator for Current Functional Status.   Therapy/Group: Individual Therapy  AustLorie Phenix/2018, 2:00 PM

## 2016-04-11 NOTE — Progress Notes (Signed)
Occupational Therapy Session Note  Patient Details  Name: Herbert Marquez MRN: [id number] Date of Birth: [birth year]  Today's Date: 04/11/2016 OT Individual Time: [id number] OT Individual Time Calculation (min): 30 min    Skilled Therapeutic Interventions/Progress Updates:    Pt completed transfer from supine to sit EOB and then to wheelchair with min guard assist.  He was able to work on functional transfers to therapy mat with the RW with supervision as well.  He worked on [employer] coordination with ball toss.  Beach ball used to begin session with pt able to complete with 90% accuracy.  Progressed to smaller beach ball and then a tennis ball.  Moderate difficulty with catching and tossing the tennis ball between his hands, but he was able to toss it to the therapist with the RUE consistently.  Finished session with picking up small animal figurines from the table and then placing them in a container.  Pt dropped 20% of them secondary to decreased sensation in the right hand.  Returned to the room at the end of the session and transferred to the bed with min guard assist to complete session.  Pt left in room with call button in reach and spouse in room as well.     Therapy Documentation Precautions:  Precautions Precautions: Fall Precaution Comments: RLE flexor tone at the hamstring Required Braces or Orthoses: Cervical Brace Cervical Brace: Soft collar (cervical collar for comfort only) Restrictions Weight Bearing Restrictions: No  Pain: Pain Assessment Pain Assessment: No/denies pain ADL: See Function Navigator for Current Functional Status.   Therapy/Group: Individual Therapy  Jaquilla Woodroof 04/11/2016, 4:19 PM

## 2016-04-12 ENCOUNTER — Inpatient Hospital Stay (HOSPITAL_COMMUNITY): Payer: Medicare Other | Admitting: *Deleted

## 2016-04-12 ENCOUNTER — Inpatient Hospital Stay (HOSPITAL_COMMUNITY): Payer: Medicare Other | Admitting: Physical Therapy

## 2016-04-12 ENCOUNTER — Inpatient Hospital Stay (HOSPITAL_COMMUNITY): Payer: Medicare Other | Admitting: Occupational Therapy

## 2016-04-12 ENCOUNTER — Inpatient Hospital Stay (HOSPITAL_COMMUNITY): Payer: Medicare Other | Admitting: Speech Pathology

## 2016-04-12 ENCOUNTER — Inpatient Hospital Stay (HOSPITAL_COMMUNITY): Payer: Medicare Other

## 2016-04-12 DIAGNOSIS — N390 Urinary tract infection, site not specified: Secondary | ICD-10-CM

## 2016-04-12 DIAGNOSIS — B952 Enterococcus as the cause of diseases classified elsewhere: Secondary | ICD-10-CM

## 2016-04-12 NOTE — Progress Notes (Signed)
Social Work Elease Hashimoto, LCSW Social Worker Signed   Patient Care Conference Date of Service: 04/11/2016  1:25 PM      Hide copied text Hover for attribution information Inpatient RehabilitationTeam Conference and Plan of Care Update Date: 04/11/2016   Time: 10:55 AM      Patient Name: Herbert Marquez      Medical Record Number: [id number]  Date of Birth: Oct 13, 1927 Sex: Male         Room/Bed: 4W20C/4W20C-01 Payor Info: Payor: MEDICARE / Plan: MEDICARE PART A AND B / Product Type: *No Product type* /     Admitting Diagnosis: L CVA  Admit Date/Time:  04/04/2016  5:21 PM Admission Comments: No comment available    Primary Diagnosis:  Gait disturbance, post-stroke Principal Problem: Gait disturbance, post-stroke       Patient Active Problem List    Diagnosis Date Noted  . Iron deficiency anemia 04/04/2016  . Stroke (cerebrum) ([place]) 04/04/2016  . Prediabetes    . Chronic pain of right knee    . Cerebral hemorrhage (HCC) w/ SDH s/p IV tPA    . Coronary artery disease involving native coronary artery of native heart without angina pectoris    . Paroxysmal atrial fibrillation with RVR ([place])    . Labile blood pressure    . Hypotension    . Controlled type 2 DM with peripheral circulatory disorder ([place])    . History of right hip replacement    . Acute blood loss anemia    . Acute ischemic stroke ([place]) - L temporal lobe s/p tPA 03/30/2016  . Persistent atrial fibrillation ([place])    . Displaced intertrochanteric fracture of right femur, initial encounter for closed fracture ([place]) 11/26/2015  . Protein-calorie malnutrition, severe 11/26/2015  . Closed right hip fracture ([place]) 11/25/2015  . BPH (benign prostatic hyperplasia) 11/25/2015  . GERD (gastroesophageal reflux disease) 11/25/2015  . CKD (chronic kidney disease), stage II 11/25/2015  . Closed left hip fracture ([place]) 11/25/2015  . Citrobacter infection    . Controlled diabetes mellitus type 2 with complications ([place])    .  Urinary frequency    . Sepsis ([place]) 11/05/2015  . Overactive bladder    . Sepsis, unspecified organism ([employer]) 11/04/2015  . H/O: CVA (cerebrovascular accident) 11/04/2015  . Palpitations 09/18/2015  . Cerebrovascular accident (CVA) due to bilateral occlusion of posterior cerebral arteries ([address]) 09/18/2015  . DM type 2 with diabetic peripheral neuropathy ([place])    . Gait disturbance, post-stroke 06/23/2015  . 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 Alterations of sensations following CVA (cerebrovascular accident) 06/23/2015  . Thalamic infarction ([place]) 06/21/2015  . Sinus tachycardia 06/20/2015  . Benign essential HTN    . Type 2 diabetes mellitus with complication, without long-term current use of insulin ([place])    . CAD in native artery    . Dysphagia as late effect of cerebrovascular disease    . Leukocytosis    . Hypokalemia    . Hyponatremia    . AKI (acute kidney injury) ([place])    . PSVT (paroxysmal supraventricular tachycardia) ([place])    . HLD (hyperlipidemia)    . Syncope 06/16/2015  . UTI (lower urinary tract infection) 08/31/2011  . Fracture of femoral neck, left ([place]) 03/09/2011  . Obesity 03/09/2011  . COPD (chronic obstructive pulmonary disease) ([place]) 03/09/2011      Expected Discharge Date: Expected Discharge Date: 04/14/16   Team Members Present: Physician leading conference: Dr. Alysia Penna Social Worker Present: Jacqlyn Larsen  Cale Bethard, LCSW Nurse Present: Dorthula Nettles, RN PT Present: Barrie Folk, PT OT Present: Clyda Greener, OT SLP Present: Gunnar Fusi, SLP PPS Coordinator present : Daiva Nakayama, RN, CRRN       Current Status/Progress Goal Weekly Team Focus  Medical     Intake, improving, anemia, improving  Prevent secondary consultations of CVA as well as recurrent CVA  Continue monitoring of hemoglobin and basic metabolic package, medication adjustments   Bowel/Bladder      (Pt had BM 2/6.  )  Regular, formed bowel movements once a day.  Adequate  activfity levels, hydration, fiber and monitoring of bowel patterns.   Swallow/Nutrition/ Hydration     Cardiac Diet w/1500 fluid restriction. and thin liquids.  No aspiration, good intake of nutrients and fluids.  Maintaining healthy weight and nutritional intake.   ADL's     Supervision for UB selfcare in sitting with min assist for all LB selfcare sit to stand.  Min to min guard assist for transfers to the shower with use of the RW.  Decreased coordination in the RUE as well as increased ataxia.  Needs assist with tying shoes   supervision overall  selfcare retraining, balance retraining, transfer training, neuromuscular reeducation, therapeutic exercise, DME education, pt/family education   Mobility     Min-supervision assist with transfers, bed mobility, and gait with RW for house hold distances.   supervision assist overall with LRAD   Improved endurance, balance, R neuromotor control, and safety with gait.    Communication     Mod A  Min A  utilizing word finding strategies, communicating wants and needs   Safety/Cognition/ Behavioral Observations   Mod A  Min A  Sustained attention, basic problem solving, orientation   Pain               Skin     Skin intact, bruising to side, slight swelling to right knee. No breakdown.  Skin to stay intact, no breakdown in perineal area or bony prominences.  frequent repositioning by pt, assisted as needed by staff, increasing activity and strength through therapy, good nutrition.     Rehab Goals Patient on target to meet rehab goals: Yes *See Care Plan and progress notes for long and short-term goals.   Barriers to Discharge: Wife is elderly. He cannot provide much physical assistance     Possible Resolutions to Barriers:  Continue neuromuscular reeducation.     Discharge Planning/Teaching Needs:  Wife would like to take pt home but needs to be able to manage him. She can do supervision level and not much if any physical care      Team  Discussion:  Goals supervision and PT upgraded goals to supervision level. Cognitive issues close to baseline now. Once UTI treated and got some sleep, felt like a new man. Wife is here daily and observes in therapies. Medically stable for DC Sat.  Revisions to Treatment Plan:  Upgraded goals supervision level. DC 2/10    Continued Need for Acute Rehabilitation Level of Care: The patient requires daily medical management by a physician with specialized training in physical medicine and rehabilitation for the following conditions: Daily direction of a multidisciplinary physical rehabilitation program to ensure safe treatment while eliciting the highest outcome that is of practical value to the patient.: Yes Daily medical management of patient stability for increased activity during participation in an intensive rehabilitation regime.: Yes Daily analysis of laboratory values and/or radiology reports with any subsequent need for medication adjustment of medical  intervention for : Blood pressure problems;Neurological problems;Mood/behavior problems   Elease Hashimoto 04/12/2016, 8:38 AM      Elease Hashimoto, LCSW Social Worker Signed  Patient Care Conference 06/29/2015 12:11 PM    Expand All Collapse All   Inpatient RehabilitationTeam Conference and Plan of Care Update Date: 06/29/2015   Time: 11:00 AM     Patient Name: Herbert Marquez       Medical Record Number: [id number]  Date of Birth: [birth year] Sex: Male         Room/Bed: 4W11C/4W11C-01 Payor Info: Payor: MEDICARE / Plan: MEDICARE PART A AND B / Product Type: *No Product type* /    Admitting Diagnosis: CVA  Admit Date/Time:  06/21/2015  6:01 PM Admission Comments: No comment available   Primary Diagnosis:  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  Gait disturbance, post-stroke  06/23/2015   .  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   Alterations of sensations following CVA (cerebrovascular accident)  06/23/2015   .  Thalamic infarction ([place])  06/21/2015   .  Sinus tachycardia ([place])  06/20/2015   .  Benign essential HTN     .  Type 2 diabetes mellitus with complication, without long-term current use of insulin ([place])     .  Coronary artery disease involving native coronary artery of native heart without angina pectoris     .  Dysphagia as late effect of cerebrovascular disease     .  Low grade fever     .  Leukocytosis     .  Hypokalemia     .  Hyponatremia     .  AKI (acute kidney injury) ([place])     .  PSVT (paroxysmal supraventricular tachycardia) ([place])     .  Cerebral thrombosis with cerebral infarction  06/17/2015   .  Acute CVA (cerebrovascular accident) ([place])  06/17/2015   .  Stroke (cerebrum) ([place])     .  HLD (hyperlipidemia)     .  Syncope  06/16/2015   .  Hyperlipidemia  04/22/2014   .  UTI (lower urinary tract infection)  08/31/2011   .  Acute renal failure ([place])  08/31/2011   .  Fracture of femoral neck, left ([place])  03/09/2011   .  DM (diabetes mellitus) ([place])  03/09/2011   .  Obesity  03/09/2011   .  COPD (chronic obstructive pulmonary disease) ([place])  03/09/2011   .  Essential hypertension  03/09/2011   .  CAD (coronary artery disease)  09/20/2010   .  Diabetes mellitus  09/20/2010   .  HTN (hypertension)  09/20/2010     Expected Discharge Date: Expected Discharge Date: 07/09/15  Team Members Present: Physician leading conference: Dr. Alysia Penna Social Worker Present: Ovidio Kin, LCSW Nurse Present: Rayetta Pigg, RN PT Present: Jorge Mandril, PT OT Present: [name], OT SLP Present: Windell Moulding, SLP PPS Coordinator present : Daiva Nakayama, RN, CRRN        Current Status/Progress  Goal  Weekly Team Focus   Medical     pocketing mild oral phase dysphagia, poor coordination R side, Right knee pain  maintain medical stability  improve Right knee pain   Bowel/Bladder     Incont  episodes; UTI tx with keflex; LBM except smears 4/23  Cont x2 with min assist  Continue encouraging use of urinal and regular emptying    Swallow/Nutrition/ Hydration     Dys 3 textures, thin liquids   mod I   trials of regular textures    ADL's     Mod assist UB, total assist LB, Mod assist sit > stand and squat pivot transfers, pt with decline in function on 4/24 and 4/25 had been supervision-min assist bathing and dressing with exception of mod assist for standing balance due to Rt knee weakness  Supervision overall  pt/family education, transfers, sit > stand, ADL retraining, RUE sensation/proprioception   Mobility     minA transfers and bed mobility, mod sit <>stand, maxA gait with L rail d/t R knee instability/impaired proprioception, poor endurance   mod I bed mobility, S transfers, S gait in home, stairs, w/c propulsion  transfers, R NMR, gait training, activity tolerance    Communication               Safety/Cognition/ Behavioral Observations    mild higher level cognitive deficits    mod I   semi-complex problem solving    Pain     No complaints of pain  < 2 on 0-10  Continue to monitor pt pain qshift and as needed    Skin     Bilat arm skin tears with tegaderm   No new skin breakdown/infection while on Rehab   Continue to monitor/treat      *See Care Plan and progress notes for long and short-term goals.    Barriers to Discharge:  had a set back with UTI     Possible Resolutions to Barriers:   cont rehab and med management     Discharge Planning/Teaching Needs:   HOme with wife she has some health issues of her own and can not provide much physical care. She is here daily observing in therapies.        Team Discussion:    Goals-supervision level. Being treated for UTI doing much better today. Poor endurance and needs therapies spread out. Upgraded diet to Dys 3 thin plans to upgrade to regular with next meal. R-knee brace ordered for stability   Revisions to Treatment  Plan:    Upgraded diet to Dys 3 thin and then will regular    Continued Need for Acute Rehabilitation Level of Care: The patient requires daily medical management by a physician with specialized training in physical medicine and rehabilitation for the following conditions: Daily direction of a multidisciplinary physical rehabilitation program to ensure safe treatment while eliciting the highest outcome that is of practical value to the patient.: Yes Daily medical management of patient stability for increased activity during participation in an intensive rehabilitation regime.: Yes Daily analysis of laboratory values and/or radiology reports with any subsequent need for medication adjustment of medical intervention for : Mood/behavior problems;Neurological problems  Elease Hashimoto 06/29/2015, 12:11 PM                  Patient ID: Tomi Bamberger, male   DOB: [birth year], 83 y.o.   MRN: [id number]

## 2016-04-12 NOTE — Progress Notes (Signed)
Physical Therapy Session Note  Patient Details  Name: Herbert Marquez MRN: [id number] Date of Birth: 1927/11/19  Today's Date: 04/12/2016 PT Individual Time: 1445-1510 AND 1004-1045 PT Individual Time Calculation (min): 25 min AND 92mn   Short Term Goals: Week 1:  PT Short Term Goal 1 (Week 1): Patient will transfer with mod assist consistently PT Short Term Goal 2 (Week 1): Patient will Ambulate 278fwith mod assist  PT Short Term Goal 3 (Week 1): Patient will ascend/descent 2 steps with Mod assist  PT Short Term Goal 4 (Week 1): Patient will propell WC 15099fith supervision assist  PT Short Term Goal 5 (Week 1): Patient will perform bed mobility with mod assist consistently.   Skilled Therapeutic Interventions/Progress Updates:   Session 1 Pt received supine in bed and agreeable to PT. Supine>sit transfer with Supervision assist, minor use of bed features and min cues for sequencing of RLE.   WC mobility x total of 150f69fth rest break for RN to apply voltaren gel to R knee.   Gait training instructed by PT for [id number] RW and supervision assist for safety.   Dynamic standing balance training for forward reaches to grab cup followed by  lateral reaches to hand cup therapist with 1 UE support on RW. Close supervision assist from PT with mild increase in R knee pain with lateral reach to R  [facility] to [facility] and [facility] and all needs met.     Session 2  Pt received supine in bed and agreeable to PT. Supine>sit transfer with supervision assist and min cues for use of bed features.   Squat pivot transfer with Supervision assist from PT and min cues for proper UE placement.    PT transferred pt to rehab gym for family training with Car transfer. Car transfer completed x 2 with supervision assist from PT and then supervision assist from spouse. Min cues from PT for positioning and minor safety cues to prevent UE support on unstable  structures.   Stand PT transfer training with wife to and from mat table with RW and Supervision assist. PT assessed WC size to assess possible need for new WC. Patient's [place] is same size that patient is using in CIR.  [facility] returned to room and [facility] and all needs met.    .       Therapy Documentation Precautions:  Precautions Precautions: Fall Precaution Comments: RLE flexor tone at the hamstring Required Braces or Orthoses: Cervical Brace Cervical Brace: Soft collar (cervical collar for comfort only) Restrictions Weight Bearing Restrictions: No Pain: Pain Assessment Pain Assessment: 0-10 Pain Score: 2  Faces Pain Scale: Hurts a little bit Pain Location: Knee Pain Descriptors / Indicators: Discomfort Pain Frequency: Intermittent Pain Onset: On-going Pain Intervention(s): Medication (See eMAR) Other Treatments:     See Function Navigator for Current Functional Status.   Therapy/Group: Individual Therapy  AustiLorie Phenix2018, 4:47 PM

## 2016-04-12 NOTE — Plan of Care (Signed)
Problem: RH Balance Goal: LTG Patient will maintain dynamic standing balance (PT) LTG:  Patient will maintain dynamic standing balance with assistance during mobility activities (PT)  Upraded Due to progress  Problem: RH Bed to Chair Transfers Goal: LTG Patient will perform bed/chair transfers w/assist (PT) LTG: Patient will perform bed/chair transfers with assistance, with/without cues (PT).  Upraded Due to progress  Problem: RH Car Transfers Goal: LTG Patient will perform car transfers with assist (PT) LTG: Patient will perform car transfers with assistance (PT).  Upraded Due to progress  Problem: RH Ambulation Goal: LTG Patient will ambulate in controlled environment (PT) LTG: Patient will ambulate in a controlled environment, # of feet with assistance (PT).  Upraded Due to progress Goal: LTG Patient will ambulate in home environment (PT) LTG: Patient will ambulate in home environment, # of feet with assistance (PT).  Upraded Due to progress

## 2016-04-12 NOTE — Progress Notes (Signed)
Recreational Therapy Assessment and Plan  Patient Details  Name: Herbert Marquez MRN: [id number] Date of Birth: [birth year] Today's Date: 04/12/2016  Rehab Potential:  Good ELOS:   d/c 2/10  Assessment Clinical Impression:Problem List:      Patient Active Problem List   Diagnosis Date Noted  . Iron deficiency anemia 04/04/2016  . Stroke (cerebrum) ([place]) 04/04/2016  . Prediabetes   . Chronic pain of right knee   . Cerebral hemorrhage (HCC) w/ SDH s/p IV tPA   . Coronary artery disease involving native coronary artery of native heart without angina pectoris   . Paroxysmal atrial fibrillation with RVR ([place])   . Labile blood pressure   . Hypotension   . Controlled type 2 DM with peripheral circulatory disorder ([place])   . History of right hip replacement   . Acute blood loss anemia   . Acute ischemic stroke ([place]) - L temporal lobe s/p tPA 03/30/2016  . Persistent atrial fibrillation ([place])   . Displaced intertrochanteric fracture of right femur, initial encounter for closed fracture ([place]) 11/26/2015  . Protein-calorie malnutrition, severe 11/26/2015  . Closed right hip fracture ([place]) 11/25/2015  . BPH (benign prostatic hyperplasia) 11/25/2015  . GERD (gastroesophageal reflux disease) 11/25/2015  . CKD (chronic kidney disease), stage II 11/25/2015  . Closed left hip fracture ([place]) 11/25/2015  . Citrobacter infection   . Controlled diabetes mellitus type 2 with complications ([place])   . Urinary frequency   . Sepsis ([place]) 11/05/2015  . Overactive bladder   . Sepsis, unspecified organism ([place]) 11/04/2015  . H/O: CVA (cerebrovascular accident) 11/04/2015  . Palpitations 09/18/2015  . Cerebrovascular accident (CVA) due to bilateral occlusion of posterior cerebral arteries ([place]) 09/18/2015  . DM type 2 with diabetic peripheral neuropathy ([place])   . Gait disturbance, post-stroke 06/23/2015  . 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 Alterations of sensations following CVA  (cerebrovascular accident) 06/23/2015  . Thalamic infarction ([place]) 06/21/2015  . Sinus tachycardia 06/20/2015  . Benign essential HTN   . Type 2 diabetes mellitus with complication, without long-term current use of insulin ([place])   . CAD in native artery   . Dysphagia as late effect of cerebrovascular disease   . Leukocytosis   . Hypokalemia   . Hyponatremia   . AKI (acute kidney injury) ([place])   . PSVT (paroxysmal supraventricular tachycardia) ([place])   . HLD (hyperlipidemia)   . Syncope 06/16/2015  . UTI (lower urinary tract infection) 08/31/2011  . Fracture of femoral neck, left ([place]) 03/09/2011  . Obesity 03/09/2011  . COPD (chronic obstructive pulmonary disease) ([place]) 03/09/2011    Past Medical History:      Past Medical History:  Diagnosis Date  . Arthritis    "pretty much all over"   . Basal cell carcinoma    "several burned off his body, face, head"  . BPH (benign prostatic hypertrophy)   . Coronary artery disease    a. s/p PCI of RCA in 2006  . CVA (cerebral infarction)    a. 06/2015: left thalamic and bilateral PCA  . GERD (gastroesophageal reflux disease)   . Hyperlipidemia   . Hypertension   . TIA (transient ischemic attack)    Approximately 6 weeks post-cardiac catheterization.   . Type II diabetes mellitus ([place])    "prediabetic; lost alot of weight; not diabetic now" (06/16/2015)   Past Surgical History:       Past Surgical History:  Procedure Laterality Date  . CARDIOVASCULAR STRESS TEST  07/01/2007  EF 74%  . CATARACT EXTRACTION, BILATERAL    . CORONARY ANGIOPLASTY WITH STENT PLACEMENT  10/2004   stenting x 2 to RCA  . FEMUR IM NAIL Right 11/26/2015   Procedure: INTRAMEDULLARY RIGHT (IM) NAIL FEMORAL;  Surgeon: Rod Can, MD;  Location: [facility];  Service: Orthopedics;  Laterality: Right;  . HERNIA REPAIR    . HIP ARTHROPLASTY  03/09/2011   Procedure: ARTHROPLASTY BIPOLAR HIP;  Surgeon: Mauri Pole;  Location:  [facility];  Service: Orthopedics;  Laterality: Left;  . LAPAROSCOPIC INCISIONAL / UMBILICAL / VENTRAL HERNIA REPAIR     "below his naval"    Assessment & Plan Clinical Impression: Patient is a 83 y.o.right handed malewith history of CAD s/p stent, PAF, HTN, T2DM, left thalamic infarct 9/17--on ASA/plavix PTA, RLE weakness post R-THR; who was admitted on 03/30/16 with difficulty speaking and confusion. History taken from chart review and wife. CT head without acute abnormality and was treated with tPA. MRI brain reviewed, negative, but limited due to motion. Follow up CTA head and neck done revealing left temporal lobe hemorrhagic infarct with subdural extension in middle cranial fossa, 60-70% R-ICA stenosis and 40-50% L-ICA stenosis. 2D echo with EF 65- 70% with mild LVH. Dr. Erlinda Hong felt that stroke embolic due to A fib with hemorrhagic conversion. He recommends "considering starting ASA day 7 post stroke and then anticoagulation once blood absorbed." He had Afib with HR in 130s that has improved with addition of low dose BB. Verbal output improving with minimal word finding deficits. Patient with resultant expressive deficits, right sided weakness and pain affecting mobility.  Patient transferred to CIR on 04/04/2016 .   Met with pt to discuss TR services with emphasis on use of leisure time post discharge.  Pt able to identify 3-4 activities for participation with the help of his wife.  Pt intends [facility] for leisure & social opportunities.  No further TR as pt is expected to discharge home with wife on 2/10.   Recommendations for other services: None   Discharge Criteria: Patient will be discharged from TR if patient refuses treatment 3 consecutive times without medical reason.  If treatment goals not met, if there is a change in medical status, if patient makes no progress towards goals or if patient is discharged from hospital.  The above assessment, treatment plan, treatment  alternatives and goals were discussed and mutually agreed upon: by patient  [place] 04/12/2016, 3:36 PM

## 2016-04-12 NOTE — Progress Notes (Signed)
Occupational Therapy Session Note  Patient Details  Name: Herbert Marquez MRN: [id number] Date of Birth: [birth year]  Today's Date: 04/12/2016 OT Individual Time: 1100-1130 OT Individual Time Calculation (min): 30 min    Short Term Goals: Week 1:  OT Short Term Goal 1 (Week 1): Pt will complete UB bathing with supervision.  OT Short Term Goal 2 (Week 1): Pt will complete UB dressing with min assist sitting EOB or chair. OT Short Term Goal 3 (Week 1): Pt will complete LB bathing with mod assist sit to stand.  OT Short Term Goal 4 (Week 1): Pt will complete LB dressing with mod assist sit to stand.  OT Short Term Goal 5 (Week 1): Pt will complete toilet transfers with mod assist stand pivot.   Skilled Therapeutic Interventions/Progress Updates: 1:1 therapeutic activities: with focus on functional ambulation with RW at slow pace with steadying A to supervision and right UE functional use with functional reach. Pt able to ambulate ~50 feet before requesting to sit due to pain. Transitioned to gym via w/c. Focus on lateral key pinch and distal functional reach with right hand with different difficulty of closepins at different heights. Discussed current home w/c with wife and addressed her concerns with SW and team PT. Left pt in room with wife present.      Therapy Documentation Precautions:  Precautions Precautions: Fall Precaution Comments: RLE flexor tone at the hamstring Required Braces or Orthoses: Cervical Brace Cervical Brace: Soft collar (cervical collar for comfort only) Restrictions Weight Bearing Restrictions: No General:   Vital Signs:   Pain: Ongoing c/o of rihg tknee pain with standing and functional ambulation but relief with rest.  Allowed for seated activity.  See Function Navigator for Current Functional Status.   Therapy/Group: Individual Therapy  [name] [facility] 04/12/2016, 2:47 PM

## 2016-04-12 NOTE — Progress Notes (Signed)
Occupational Therapy Session Note  Patient Details  Name: Herbert Marquez MRN: [id number] Date of Birth: September 27, 1927  Today's Date: 04/12/2016 OT Individual Time: [id number] OT Individual Time Calculation (min): 59 min    Short Term Goals: Week 1:  OT Short Term Goal 1 (Week 1): Pt will complete UB bathing with supervision.  OT Short Term Goal 2 (Week 1): Pt will complete UB dressing with min assist sitting EOB or chair. OT Short Term Goal 3 (Week 1): Pt will complete LB bathing with mod assist sit to stand.  OT Short Term Goal 4 (Week 1): Pt will complete LB dressing with mod assist sit to stand.  OT Short Term Goal 5 (Week 1): Pt will complete toilet transfers with mod assist stand pivot.   Skilled Therapeutic Interventions/Progress Updates:    Pt completed self feeding to start session from supine with HOB elevated.  He was able to use the RUE throughout task with built up handle to assist with grasping the fork.  Educated pt's wife on possible use of weighted utensils or putting one pound arm weight on hand to help decrease ataxia movement if eating harder to control items such as soup.  He was able to manage eggs and sausage without difficulty. Once finished pt completed transfer to the toilet seat over the elevated toilet in the bathroom.  He completed all aspects of toileting with supervision, using the RW for support.  Transitioned to the tub bench after toileting for showering.  Supervision for all aspects of bathing with min instructional cueing to rinse off his feet before trying to stand to wash peri area.  Completed dressing sit to stand at the sink with incorporation of energy conservation technique of donning underpants and pants before standing to pull them over his hips.  Grooming completed in sitting with min assist needed to donn hearing aid in the left ear.  Pt transferred back to the bed to rest at end of session with supervision.  Bed alarm in place with call button and phone in  reach.   Therapy Documentation Precautions:  Precautions Precautions: Fall Precaution Comments: RLE flexor tone at the hamstring Required Braces or Orthoses: Cervical Brace Cervical Brace: Soft collar (cervical collar for comfort only) Restrictions Weight Bearing Restrictions: No  Pain: Pain Assessment Pain Assessment: No/denies pain ADL: See Function Navigator for Current Functional Status.   Therapy/Group: Individual Therapy  Wheeler Incorvaia OTR/L 04/12/2016, 8:59 AM

## 2016-04-12 NOTE — Progress Notes (Signed)
Subjective/Complaints:  No issues overnite.   DIscussed CT head reordered today as well as D/C date ROS- negative CP, SOB, N/V/D  Objective: Vital Signs: Blood pressure 124/61, pulse 70, temperature 98 F (36.7 C), temperature source Oral, resp. rate 16, height 5\' 7"  (1.702 m), weight 76.7 kg (169 lb 3.2 oz), SpO2 99 %. No results found. Results for orders placed or performed during the hospital encounter of 04/04/16 (from the past 72 hour(s))  CBC     Status: Abnormal   Collection Time: 04/11/16  8:28 AM  Result Value Ref Range   WBC 9.1 4.0 - 10.5 K/uL   RBC 4.00 (L) 4.22 - 5.81 MIL/uL   Hemoglobin 11.9 (L) 13.0 - 17.0 g/dL   HCT 37.2 (L) 39.0 - 52.0 %   MCV 93.0 78.0 - 100.0 fL   MCH 29.8 26.0 - 34.0 pg   MCHC 32.0 30.0 - 36.0 g/dL   RDW 14.4 11.5 - 15.5 %   Platelets 572 (H) 150 - 400 K/uL     HEENT: normal and poor dentition Cardio: RRR and no murmur Resp: CTA B/L and unlabored GI: BS positive and NT, ND Extremity:  No Edema Skin:   Other scalp keratosis Neuro: Confused, Abnormal Sensory appears diminished RUE but difficult to assess due to hearing impairment, Abnormal Motor 3- RUE and RLE, some limitation RLE due to pain, Tone:  Hypertonia and Other HOH, oriented to person not place/time Musc/Skel:  Extremity tender right knee pain with flexion contracture, - effusion Gen NAD   Assessment/Plan: 1. 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 Physiatrist is providing close team supervision and 24 hour management of active medical problems listed below. Physiatrist and rehab team continue to assess barriers to discharge/monitor patient progress toward functional and medical goals. FIM: Function - Bathing Position: Shower Body parts bathed by patient: Right arm, Left arm, Chest, Abdomen, Front perineal area, Buttocks,  Right upper leg, Left upper leg, Right lower leg, Left lower leg Body parts bathed by helper: Back Assist Level: Touching or steadying assistance(Pt > 75%)  Function- Upper Body Dressing/Undressing What is the patient wearing?: Pull over shirt/dress Pull over shirt/dress - Perfomed by patient: Thread/unthread right sleeve, Thread/unthread left sleeve, Put head through opening, Pull shirt over trunk Pull over shirt/dress - Perfomed by helper: Thread/unthread right sleeve, Thread/unthread left sleeve, Put head through opening Assist Level: More than reasonable time (Pt helped to remove pants in order to apply lotion.) Set up : To obtain clothing/put away Function - Lower Body Dressing/Undressing What is the patient wearing?: Underwear, Pants, Shoes, Socks Position: Wheelchair/chair at sink Underwear - Performed by patient: Thread/unthread right underwear leg, Thread/unthread left underwear leg, Pull underwear up/down Pants- Performed by patient: Thread/unthread right pants leg Pants- Performed by helper: Thread/unthread left pants leg, Pull pants up/down Non-skid slipper socks- Performed by patient: Don/doff right sock, Don/doff left sock Socks - Performed by patient: Don/doff right sock, Don/doff left sock Socks - Performed by helper: Don/doff right sock, Don/doff left sock Shoes - Performed by patient: Don/doff right shoe, Don/doff left shoe, Fasten right Shoes - Performed by helper: Fasten left Assist for lower body dressing: Touching or steadying assistance (Pt > 75%)  Function - Toileting Toileting steps completed by helper: Adjust clothing prior to toileting, Performs perineal hygiene, Adjust clothing after toileting Toileting Assistive Devices: Grab bar or rail Assist level: Two helpers  Function -  Air cabin crew transfer assistive device: Grab bar, Facilities manager lift: Stedy Assist level to toilet: Total assist (Pt < 25%) Assist level from toilet: Total  assist (Pt < 25%) Assist level to bedside commode (at bedside): Total assist (Pt < 25%) Assist level from bedside commode (at bedside): Total assist (Pt < 25%)  Function - Chair/bed transfer Chair/bed transfer method: Ambulatory Chair/bed transfer assist level: Touching or steadying assistance (Pt > 75%) Chair/bed transfer assistive device: Armrests, Walker  Function - Locomotion: Wheelchair Will patient use wheelchair at discharge?: Yes Type: Manual Max wheelchair distance: 150 Assist Level: Supervision or verbal cues Assist Level: Supervision or verbal cues Wheel 150 feet activity did not occur: Refused Assist Level: Supervision or verbal cues Turns around,maneuvers to table,bed, and toilet,negotiates 3% grade,maneuvers on rugs and over doorsills: No Function - Locomotion: Ambulation Assistive device: Walker-rolling Max distance: 65 Assist level: Supervision or verbal cues Assist level: Supervision or verbal cues Walk 50 feet with 2 turns activity did not occur: Safety/medical concerns Assist level: Supervision or verbal cues Walk 150 feet activity did not occur: Safety/medical concerns Walk 10 feet on uneven surfaces activity did not occur: Safety/medical concerns  Function - Comprehension Comprehension: Auditory Comprehension assistive device: Hearing aids Comprehension assist level: Understands basic 90% of the time/cues < 10% of the time  Function - Expression Expression: Verbal Expression assist level: Expresses basic 90% of the time/requires cueing < 10% of the time.  Function - Social Interaction Social Interaction assist level: Interacts appropriately with others with medication or extra time (anti-anxiety, antidepressant).  Function - Problem Solving Problem solving assist level: Solves basic 75 - 89% of the time/requires cueing 10 - 24% of the time  Function - Memory Memory assist level: Recognizes or recalls 75 - 89% of the time/requires cueing 10 - 24% of the  time Patient normally able to recall (first 3 days only): Current season, That he or she is in a hospital, Location of own room, Staff names and faces   Medical Problem List and Plan: 1.  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 CIR PT, OT, SLP continues .  recheck CT today eval for bleeding prior to resumption of eliquis, neuro to review and advise  2.  DVT Prophylaxis/Anticoagulation: Mechanical: Sequential compression devices, below knee Bilateral lower extremities--start Lovenox tomorrow.  3. Pain Management: tylenol prn. Voltaren gel for knee pain.4. Mood: LCSW to follow for evaluation and support.  5. Neuropsych: This patient not fully capable of making decisions on his own behalf. 6. Skin/Wound Care: routine pressure relief measures 7. Fluids/Electrolytes/Nutrition: Monitor I/O. BMET ok no sign of pre renal azotemia, recorded intake 75 -100% of meals 8. PAF: I To start ASA on Feb 2nd. Eliquis once hemorrhagic transformation absorbed.  No further tachy episodes Vitals:   04/11/16 1307 04/12/16 0457  BP: 113/70 124/61  Pulse: 86 70  Resp: 17 16  Temp: 98.1 F (36.7 C) 98 F (36.7 C)   9. Prediabetes: blood sugars have improved with weight loss and now Hgb A1c-5.0.  10. Blood pressures: Avoid hypotension ---BB stopped, one episode of tachy 11. Chronic knee pain/history of right hip replacement: Add Voltaren gel for relief.Right knee OA   Now has neoprene sleeve for comfort, has knee orthosis for walking  12. Hyponatremia: Question chronic-, will d/c SSRI, would like to avoid fluid restriction if possible  13. Iron deficiency anemia: On iron supplement 14. Leukocytosis: Monitor for signs of infection.has had confusion last 2 days -will check UA/UCS 15.  Insomnia with daytime somnolence, will need to establish normal  sleep/wake cycle,               16.  Mild anemia will monitor on ASA, Hgb 2/7  inching upward, 11.9 17. UTI enterococcus, sens to amp, day #6/7 LOS (Days) 8 A FACE TO FACE EVALUATION WAS PERFORMED  Dashun Borre E 04/12/2016, 7:55 AM

## 2016-04-12 NOTE — Discharge Summary (Signed)
Physician Discharge Summary  Patient ID: Herbert Marquez MRN: [id number] DOB/AGE: [birth year] 81 y.o.  Admit date: 04/04/2016 Discharge date: 04/14/2016  Discharge Diagnoses:  Principal Problem:   Cerebral hemorrhage ([place]) w/ SDH s/p IV tPA Active Problems:   Benign essential HTN   Leukocytosis   AKI (acute kidney injury) (HCC)   Gait disturbance, post-stroke   Stroke (cerebrum) (HCC)   Enterococcus UTI   PAF (paroxysmal atrial fibrillation) (HCC)   Chronic pain of left knee   Discharged Condition: stable  Significant Diagnostic Studies:  Ct Head Wo Contrast  Result Date: 04/12/2016 CLINICAL DATA:  F/u intracerebral hemorrhage EXAM: CT HEAD WITHOUT CONTRAST TECHNIQUE: Contiguous axial images were obtained from the base of the skull through the vertex without intravenous contrast. COMPARISON:  03/31/2016 FINDINGS: Brain: Within the left temporal lobe, there is an isodense hemorrhage now measuring 1.9 x 2.2 cm. On previous exam, hemorrhage measured 1.6 x 2.5 cm. There is surrounding parenchymal edema. Moderate central and cortical atrophy are present. No significant midline shift. Periventricular white matter changes are consistent with small vessel disease. Vascular: There is atherosclerotic calcification of the carotid siphons. Skull: Normal. Negative for fracture or focal lesion. Sinuses/Orbits: No acute finding. Other: None IMPRESSION: 1. Evolving hemorrhage of the left temporal lobe. 2. Persistent surrounding edema without significant midline shift or mass effect. 3. No new hemorrhage. 4. Atrophy and small vessel disease. Electronically Signed   By: Nolon Nations M.D.   On: 04/12/2016 12:37    Labs:  Basic Metabolic Panel: BMP Latest Ref Rng & Units 04/09/2016 04/05/2016 04/04/2016  Glucose 65 - 99 mg/dL 98 110(H) 106(H)  BUN 6 - 20 mg/dL 10 13 11   Creatinine 0.61 - 1.24 mg/dL 0.90 0.95 0.93  Sodium 135 - 145 mmol/L 131(L) 126(L) 126(L)  Potassium 3.5 - 5.1 mmol/L 3.8 3.8 3.7   Chloride 101 - 111 mmol/L 102 98(L) 96(L)  CO2 22 - 32 mmol/L 25 22 22   Calcium 8.9 - 10.3 mg/dL 8.7(L) 8.6(L) 8.5(L)    CBC: CBC Latest Ref Rng & Units 04/11/2016 04/05/2016 04/04/2016  WBC 4.0 - 10.5 K/uL 9.1 11.3(H) 10.9(H)  Hemoglobin 13.0 - 17.0 g/dL 11.9(L) 11.6(L) 10.8(L)  Hematocrit 39.0 - 52.0 % 37.2(L) 34.2(L) 32.5(L)  Platelets 150 - 400 K/uL 572(H) 397 364    CBG: No results for input(s): GLUCAP in the last 168 hours.  Brief HPI:    Herbert Marquez a 83 y.o.right handed malewith history of CAD s/p stent, PAF, HTN,  T2DM, left thalamic infarct 9/17--on ASA/plavix PTA, RLE weakness post R-THR; who was admitted on 03/30/16 with difficulty speaking and confusion. History taken from chart review and wife.  CT head without acute abnormality and was treated with tPA. MRI brain reviewed, negative, but limited due to motion.   Follow up  CTA head and neck done revealing left temporal lobe hemorrhagic infarct with subdural extension in middle cranial fossa, 60-70% R-ICA stenosis and 40-50% L-ICA stenosis.  2D echo with EF 65- 70% with mild LVH. Dr. Erlinda Hong felt that stroke embolic due to A fib with hemorrhagic conversion. He recommends "considering starting ASA day 7 post stroke and then anticoagulation once blood absorbed."  He had Afib with HR in 130s that has improved with addition of low dose BB. Verbal output improving with minimal word finding deficits.  Patient with resultant expressive deficits, right sided weakness and pain affecting mobility. CIR recommended for follow up therapy.    Hospital Course: Herbert Marquez was admitted to rehab  04/04/2016 for inpatient therapies to consist of PT, ST and OT at least three hours five days a week. Past admission physiatrist, therapy team and rehab RN have worked together to provide customized collaborative inpatient rehab. Blood pressures have been stable off BB and no symptoms reported with increase in activity level. Voltaren gel was added to help  with pain right knee and neoprene sleeve was used for comfort. Hyponatremia has been monitored and celexa has been weaned off. Sodium has improved to 131. Urine culture was ordered at admission due to reports of confusion and he was found to have enterobacter UTI. He was treated with 7 day course of amoxicillin and completes this at discharge. Reactive leucocytosis has resolved and H/H is slowly improving with addition of iron supplement. Low dose ASA was added on 2/2 and follow up CT head done on 2/8 showing isodense left temporal hematoma. Dr. Leonie Man felt that patient could resume Eliquis as t now 10 days post tPA.  He has made steady improvement in therapy but continues to have balance deficits due to ataxia and right sided weakness. He is currently at supervision level and will continue to receive follow up [place] [place] [place] [facility] [place] [facility] [place] after discharge.    Rehab course: During patient's stay in rehab weekly team conferences were held to monitor patient's progress, set goals and discuss barriers to discharge. At admission, patient exhibited severe cognitive deficits complicated by mild expressive aphasia. He required max multimodal cues for basic problem solving, word finding and to sustain attention. He requried total assist with basic self care tasks and max assist with mobility. He has had improvement in activity tolerance, balance, postural control, as well as ability to compensate for deficits. He is has had improvement in functional use RUE  and RLE as well as improved awareness. He is able to complete ADL tasks with supervision.  His balance continues to be have ataxia affecting standing balance and needs constant supervision when standing.  He is able to ambulate 33' with RW and supervision. He requires min cues and supervision for car transfers. His  cognitive status has improved with improvement in speech intelligibility and he requires supervision for basic problem solving,  recall and awareness. Family education was completed regarding all aspects of mobility and safety with wife who will provide supervision after discharge.      Disposition: Home   Diet: Heart Healthy.   Special Instructions: 1. Needs supervision with all activity. 2. Recheck CBC/BMET in one week.  3. Follow up with urology for input on medications and follow up urine culture.   Discharge Instructions    Ambulatory referral to Physical Medicine Rehab    Complete by:  As directed    1-2 weeks transitional care appt     Allergies as of 04/14/2016   No Known Allergies     Medication List    STOP taking these medications   aspirin EC 81 MG tablet   citalopram 10 MG tablet Commonly known as:  CELEXA   clopidogrel 75 MG tablet Commonly known as:  PLAVIX   metoprolol tartrate 25 MG tablet Commonly known as:  LOPRESSOR     TAKE these medications   acetaminophen 500 MG tablet Commonly known as:  TYLENOL Take 1 tablet (500 mg total) by mouth every 6 (six) hours as needed for mild pain. What changed:  how much to take   apixaban 5 MG Tabs tablet Commonly known as:  ELIQUIS Take 1 tablet (5 mg  total) by mouth 2 (two) times daily.   atorvastatin 80 MG tablet Commonly known as:  LIPITOR Take 1 tablet (80 mg total) by mouth daily.   diclofenac sodium 1 % Gel Commonly known as:  VOLTAREN Apply 2 g topically 3 (three) times daily before meals.   ferrous sulfate 325 (65 FE) MG tablet Take 1 tablet (325 mg total) by mouth 2 (two) times daily with a meal.   multivitamin tablet Take 1 tablet by mouth daily.   MYRBETRIQ 50 MG Tb24 tablet Generic drug:  mirabegron ER Take 1 tablet by mouth daily.   nitroGLYCERIN 0.4 MG SL tablet Commonly known as:  NITROSTAT Place 0.4 mg under the tongue every 5 (five) minutes as needed for chest pain.   pantoprazole 40 MG tablet Commonly known as:  PROTONIX Take 1 tablet (40 mg total) by mouth daily.   tamsulosin 0.4 MG Caps  capsule Commonly known as:  FLOMAX Take 0.4 mg by mouth every other day.      Follow-up Information    Charlett Blake, MD Follow up.   Specialty:  Physical Medicine and Rehabilitation Why:  office to call you with follow up appointment. Contact information: [place] [address] [phone]        Xu,Jindong, MD. Call in 1 day(s).   Specialty:  Neurology Why:  for follow up appointment Contact information: [place] [phone]        Lillard Anes, NP Follow up on 04/17/2016.   Specialty:  Family Medicine Why:  Appointment @ 10:30 AM Contact information: [place]  [address] [phone]           Signed: Bary Leriche 04/16/2016, 8:19 AM

## 2016-04-12 NOTE — Progress Notes (Signed)
Speech Language Pathology Daily Session Note  Patient Details  Name: Herbert Marquez MRN: [id number] Date of Birth: 02/24/1928  Today's Date: 04/12/2016 SLP Individual Time: 1530-1600 SLP Individual Time Calculation (min): 30 min  Short Term Goals: Week 1: SLP Short Term Goal 1 (Week 1): Pt will utilize word finding strategies at the structured phrase level with Min A verbal cues.  SLP Short Term Goal 2 (Week 1): Pt will utilize external memory aids to recall new, daily information with Mod A verbal cues.  SLP Short Term Goal 3 (Week 1): Pt will consistently demonstrate O x 4 with Mod A cues.  SLP Short Term Goal 4 (Week 1): Pt will sustain attention to basic, familiar task for ~ 1 minute intervals with Mod A verbal cues.  SLP Short Term Goal 5 (Week 1): Pt will complete basic, familiar tasks with Mod A verbal cues for functional problem solving.   Skilled Therapeutic Interventions: Skilled treatment session focused on cognition goals. SLP facilitated session by providing Min A verbal cues for pt to sequence through previous learned game. Pt required Min A verbal cues for selective attention. Pt left upright in wheelchair with wife present. Continue with current plan of care.      Function:   Cognition Comprehension Comprehension assist level: Understands basic 90% of the time/cues < 10% of the time  Expression   Expression assist level: Expresses basic 90% of the time/requires cueing < 10% of the time.  Social Interaction Social Interaction assist level: Interacts appropriately with others with medication or extra time (anti-anxiety, antidepressant).  Problem Solving Problem solving assist level: Solves basic 75 - 89% of the time/requires cueing 10 - 24% of the time  Memory Memory assist level: Recognizes or recalls 75 - 89% of the time/requires cueing 10 - 24% of the time    Pain Pain Assessment Pain Assessment: 0-10 Pain Score: 2  Faces Pain Scale: Hurts a little bit Pain  Location: Knee Pain Descriptors / Indicators: Discomfort Pain Frequency: Intermittent Pain Onset: On-going Pain Intervention(s): Medication (See eMAR)  Therapy/Group: Individual Therapy  Torin Modica B. Rutherford Nail, M.S., CCC-SLP Speech-Language Pathologist  Herbert Marquez 04/12/2016, 4:05 PM

## 2016-04-13 ENCOUNTER — Inpatient Hospital Stay (HOSPITAL_COMMUNITY): Payer: Medicare Other | Admitting: Speech Pathology

## 2016-04-13 ENCOUNTER — Inpatient Hospital Stay (HOSPITAL_COMMUNITY): Payer: Medicare Other | Admitting: Occupational Therapy

## 2016-04-13 ENCOUNTER — Inpatient Hospital Stay (HOSPITAL_COMMUNITY): Payer: Medicare Other | Admitting: Physical Therapy

## 2016-04-13 DIAGNOSIS — N390 Urinary tract infection, site not specified: Secondary | ICD-10-CM

## 2016-04-13 DIAGNOSIS — B952 Enterococcus as the cause of diseases classified elsewhere: Secondary | ICD-10-CM

## 2016-04-13 MED ORDER — DICLOFENAC SODIUM 1 % TD GEL: 2.0000 g | Freq: Three times a day (TID) | TRANSDERMAL | 0 refills | Status: DC

## 2016-04-13 MED ORDER — APIXABAN 5 MG PO TABS: 5.0000 mg | ORAL_TABLET | Freq: Two times a day (BID) | ORAL | 1 refills | Status: DC

## 2016-04-13 MED ORDER — PANTOPRAZOLE SODIUM 40 MG PO TBEC: 40.0000 mg | DELAYED_RELEASE_TABLET | Freq: Every day | ORAL | 0 refills | Status: DC

## 2016-04-13 MED ORDER — APIXABAN 5 MG PO TABS
5.0000 mg | ORAL_TABLET | Freq: Two times a day (BID) | ORAL | Status: DC
  Administered 2016-04-13 – 2016-04-14 (×2): 5 mg via ORAL
  Filled 2016-04-13 (×2): qty 1

## 2016-04-13 MED ORDER — ACETAMINOPHEN 500 MG PO TABS: 500.0000 mg | ORAL_TABLET | Freq: Four times a day (QID) | ORAL | 0 refills | Status: DC | PRN

## 2016-04-13 MED ORDER — FERROUS SULFATE 325 (65 FE) MG PO TABS: 325.0000 mg | ORAL_TABLET | Freq: Two times a day (BID) | ORAL | 0 refills | Status: DC

## 2016-04-13 MED ORDER — ATORVASTATIN CALCIUM 80 MG PO TABS: 80.0000 mg | ORAL_TABLET | Freq: Every day | ORAL | 0 refills | Status: DC

## 2016-04-13 NOTE — Progress Notes (Addendum)
Called Herbert Marquez regarding CT results. Chart/X rays reviewed by Dr. Leonie Man who recommended resuming prior dose Eliquis.  Clarified dose with [employer].

## 2016-04-13 NOTE — Progress Notes (Signed)
Speech Language Pathology Discharge Summary  Patient Details  Name: Herbert Marquez MRN: [id number] Date of Birth: 24-Oct-1927  Today's Date: 04/13/2016 SLP Individual Time: 0830-0900 SLP Individual Time Calculation (min): 30 min   Skilled Therapeutic Interventions:  Skilled treatment session focused on discharge planning, anticipatory awareness of deficits that might occur within home setting. Pt required Mod A verbal cues to create list. Pt with great progress and wife able to provide cognitive supervision 24 hours. Pt's baseline was 24 hours supervision prior to most recent hospitalization.   Patient has met 6 of 6 long term goals.  Patient to discharge at overall Supervision level.  Reasons goals not met:     Clinical Impression/Discharge Summary:  Pt with great progress in the areas of speech intelligibility, speech production, attention, basic problem solving and awareness. Education provided to wife and pt. Recommend HHTST to assess pt within home environment.    Care Partner:  Caregiver Able to Provide Assistance: Yes  Type of Caregiver Assistance: Cognitive  Recommendation:  Home Health SLP  Rationale for SLP Follow Up: Maximize functional communication;Maximize cognitive function and independence   Equipment: None   Reasons for discharge: Treatment goals met   Patient/Family Agrees with Progress Made and Goals Achieved: Yes   Function:   Cognition Comprehension Comprehension assist level: Understands basic 90% of the time/cues < 10% of the time  Expression   Expression assist level: Expresses basic 90% of the time/requires cueing < 10% of the time.  Social Interaction Social Interaction assist level: Interacts appropriately with others with medication or extra time (anti-anxiety, antidepressant).  Problem Solving Problem solving assist level: Solves basic 75 - 89% of the time/requires cueing 10 - 24% of the time  Memory Memory assist level: Recognizes or recalls 75 -  89% of the time/requires cueing 10 - 24% of the time    [name] B. Rutherford Nail, M.S., CCC-SLP Speech-Language Pathologist  [name] 04/13/2016, 8:10 AM

## 2016-04-13 NOTE — Progress Notes (Signed)
Subjective/Complaints: Discussed CT results, no note from Neuro yet ROS- negative CP, SOB, N/V/D  Objective: Vital Signs: Blood pressure 126/82, pulse 77, temperature 98 F (36.7 C), temperature source Oral, resp. rate 17, height 5\' 7"  (1.702 m), weight 76.7 kg (169 lb 3.2 oz), SpO2 99 %. Ct Head Wo Contrast  Result Date: 04/12/2016 CLINICAL DATA:  F/u intracerebral hemorrhage EXAM: CT HEAD WITHOUT CONTRAST TECHNIQUE: Contiguous axial images were obtained from the base of the skull through the vertex without intravenous contrast. COMPARISON:  03/31/2016 FINDINGS: Brain: Within the left temporal lobe, there is an isodense hemorrhage now measuring 1.9 x 2.2 cm. On previous exam, hemorrhage measured 1.6 x 2.5 cm. There is surrounding parenchymal edema. Moderate central and cortical atrophy are present. No significant midline shift. Periventricular white matter changes are consistent with small vessel disease. Vascular: There is atherosclerotic calcification of the carotid siphons. Skull: Normal. Negative for fracture or focal lesion. Sinuses/Orbits: No acute finding. Other: None IMPRESSION: 1. Evolving hemorrhage of the left temporal lobe. 2. Persistent surrounding edema without significant midline shift or mass effect. 3. No new hemorrhage. 4. Atrophy and small vessel disease. Electronically Signed   By: Nolon Nations M.D.   On: 04/12/2016 12:37   Results for orders placed or performed during the hospital encounter of 04/04/16 (from the past 72 hour(s))  CBC     Status: Abnormal   Collection Time: 04/11/16  8:28 AM  Result Value Ref Range   WBC 9.1 4.0 - 10.5 K/uL   RBC 4.00 (L) 4.22 - 5.81 MIL/uL   Hemoglobin 11.9 (L) 13.0 - 17.0 g/dL   HCT 37.2 (L) 39.0 - 52.0 %   MCV 93.0 78.0 - 100.0 fL   MCH 29.8 26.0 - 34.0 pg   MCHC 32.0 30.0 - 36.0 g/dL   RDW 14.4 11.5 - 15.5 %   Platelets 572 (H) 150 - 400 K/uL     HEENT: normal and poor dentition Cardio: RRR and no murmur Resp: CTA B/L and  unlabored GI: BS positive and NT, ND Extremity:  No Edema Skin:   Other scalp keratosis Neuro: Confused, Abnormal Sensory appears diminished RUE but difficult to assess due to hearing impairment, Abnormal Motor 3- RUE and RLE, some limitation RLE due to pain, Tone:  Hypertonia and Other HOH, oriented to person not place/time Musc/Skel:  Extremity tender right knee pain with flexion contracture, - effusion Gen NAD   Assessment/Plan: 1. 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 Physiatrist is providing close team supervision and 24 hour management of active medical problems listed below. Physiatrist and rehab team continue to assess barriers to discharge/monitor patient progress toward functional and medical goals. FIM: Function - Bathing Position: Shower Body parts bathed by patient: Right arm, Left arm, Chest, Abdomen, Front perineal area, Buttocks, Right upper leg, Left upper leg, Right lower leg, Left lower leg Body parts bathed by helper: Back Bathing not applicable: Back (did not attempt) Assist Level: Supervision or verbal cues  Function- Upper Body Dressing/Undressing What is the patient wearing?: Pull over shirt/dress Pull over shirt/dress - Perfomed by patient: Thread/unthread right sleeve, Thread/unthread left sleeve, Put head through opening, Pull shirt over trunk Pull over shirt/dress - Perfomed by helper: Thread/unthread right sleeve, Thread/unthread left sleeve, Put head through opening Assist Level: Supervision or verbal cues Set up : To obtain clothing/put away Function - Lower Body Dressing/Undressing What is  the patient wearing?: Underwear, Pants, Socks Position: Wheelchair/chair at sink Underwear - Performed by patient: Thread/unthread right underwear leg, Thread/unthread left underwear leg, Pull underwear  up/down Pants- Performed by patient: Thread/unthread right pants leg, Thread/unthread left pants leg, Pull pants up/down, Fasten/unfasten pants Pants- Performed by helper: Thread/unthread left pants leg, Pull pants up/down Non-skid slipper socks- Performed by patient: Don/doff right sock, Don/doff left sock Socks - Performed by patient: Don/doff right sock, Don/doff left sock Socks - Performed by helper: Don/doff right sock, Don/doff left sock Shoes - Performed by patient: Don/doff right shoe, Don/doff left shoe, Fasten right Shoes - Performed by helper: Fasten left Assist for lower body dressing: Supervision or verbal cues  Function - Toileting Toileting steps completed by patient: Adjust clothing after toileting, Performs perineal hygiene, Adjust clothing prior to toileting Toileting steps completed by helper: Adjust clothing prior to toileting, Performs perineal hygiene, Adjust clothing after toileting Toileting Assistive Devices: Grab bar or rail Assist level: Set up/obtain supplies  Function - Air cabin crew transfer assistive device: Grab bar, Walker, Elevated toilet seat/BSC over toilet Mechanical lift: Stedy Assist level to toilet: Total assist (Pt < 25%) Assist level from toilet: Supervision or verbal cues Assist level to bedside commode (at bedside): Total assist (Pt < 25%) Assist level from bedside commode (at bedside): Total assist (Pt < 25%)  Function - Chair/bed transfer Chair/bed transfer method: Stand pivot Chair/bed transfer assist level: Supervision or verbal cues Chair/bed transfer assistive device: Armrests, Walker  Function - Locomotion: Wheelchair Will patient use wheelchair at discharge?: Yes Type: Manual Max wheelchair distance: 147ft  Assist Level: Supervision or verbal cues Assist Level: Supervision or verbal cues Wheel 150 feet activity did not occur: Refused Assist Level: Supervision or verbal cues Turns around,maneuvers to table,bed, and  toilet,negotiates 3% grade,maneuvers on rugs and over doorsills: No Function - Locomotion: Ambulation Assistive device: Walker-rolling Max distance: 72 Assist level: Supervision or verbal cues Assist level: Supervision or verbal cues Walk 50 feet with 2 turns activity did not occur: Safety/medical concerns Assist level: Supervision or verbal cues Walk 150 feet activity did not occur: Safety/medical concerns Walk 10 feet on uneven surfaces activity did not occur: Safety/medical concerns  Function - Comprehension Comprehension: Auditory Comprehension assistive device: Hearing aids Comprehension assist level: Understands basic 90% of the time/cues < 10% of the time  Function - Expression Expression: Verbal Expression assist level: Expresses basic 90% of the time/requires cueing < 10% of the time.  Function - Social Interaction Social Interaction assist level: Interacts appropriately with others with medication or extra time (anti-anxiety, antidepressant).  Function - Problem Solving Problem solving assist level: Solves basic 75 - 89% of the time/requires cueing 10 - 24% of the time  Function - Memory Memory assist level: Recognizes or recalls 75 - 89% of the time/requires cueing 10 - 24% of the time Patient normally able to recall (first 3 days only): Current season, That he or she is in a hospital, Location of own room, Staff names and faces   Medical Problem List and Plan: 1.  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 CIR PT, OT, SLP continues .  recheck CT still showing hematoma neuro to review and advise Plan D/C 2/10 2.  DVT Prophylaxis/Anticoagulation: Mechanical: Sequential compression devices, below knee Bilateral lower extremities Cont ASA unless Eliquis oked by Neuro 3. Pain Management: tylenol prn. Voltaren gel for knee pain.4. Mood: LCSW to follow for  evaluation and support.  5. Neuropsych: This patient not fully capable of making decisions on his own behalf. 6. Skin/Wound Care: routine pressure relief measures 7. Fluids/Electrolytes/Nutrition: Monitor I/O. BMET ok no sign of pre renal azotemia, recorded intake 75 -100% of meals 8. PAF: I To start ASA on Feb 2nd. Eliquis once hemorrhagic transformation absorbed.  No further tachy episodes 2/9 Vitals:   04/12/16 0457 04/13/16 0449  BP: 124/61 126/82  Pulse: 70 77  Resp: 16 17  Temp: 98 F (36.7 C) 98 F (36.7 C)   9. Prediabetes: blood sugars have improved with weight loss and now Hgb A1c-5.0.  10. Blood pressures: Avoid hypotension ---BB stopped, one episode of tachy 11. Chronic knee pain/history of right hip replacement: Add Voltaren gel for relief.Right knee OA   Now has neoprene sleeve for comfort, has knee orthosis for walking  12. Hyponatremia: Question chronic-, will d/c SSRI, would like to avoid fluid restriction if possible                                              13. Iron deficiency anemia: On iron supplement 14. Leukocytosis: Monitor for signs of infection.has had confusion last 2 days -will check UA/UCS 15.  Insomnia with daytime somnolence, will need to establish normal sleep/wake cycle,               16.  Mild anemia will monitor on ASA, Hgb 2/7  inching upward, 11.9 17. UTI enterococcus, sens to amp, day #7/7, has hx prostate CA f/u uro as outpt LOS (Days) 9 A FACE TO FACE EVALUATION WAS PERFORMED  Berel Najjar E 04/13/2016, 7:53 AM

## 2016-04-13 NOTE — Plan of Care (Signed)
Problem: RH BOWEL ELIMINATION Goal: RH STG MANAGE BOWEL W/MEDICATION W/ASSISTANCE Pt will successfully maintain healthy Bowel Patterns with Medication with minimum Assistance.    Outcome: Progressing Pt will be aware of his bowel patterns, monitoring and requesting medication when needed and will have regular and normal-consistency bms.

## 2016-04-13 NOTE — Progress Notes (Signed)
Vascular neurology Note  Asked by rehab team to review follow up [place] which now shows isodense left temporal hematoma and patient is day 10 days post TPA bleed hence ok to start anticoagulation with eliquis for stroke prevention for atrial fibrillation.To be dosed per pharmacy. No need for aspirin  Antony Contras, MD

## 2016-04-13 NOTE — Progress Notes (Signed)
Social Work  Discharge Note  The overall goal for the admission was met for:   Discharge location: Yes-HOME WITH WIFE WHO CAN PROVIDE 24 HR SUPERVISION LEVEL  Length of Stay: Yes-10 DAYS  Discharge activity level: Yes-SUPERVISION LEVEL-MIN FOR COGNITION  Home/community participation: Yes  Services provided included: MD, RD, PT, OT, SLP, RN, CM, TR, Pharmacy and SW  Financial Services: Medicare and Private Insurance: [facility]  Follow-up services arranged: Home Health: [place] [facility]-PT,OT,RN,SP and Patient/Family request agency HH: HAD [facility] BEFORE, DME: NO NEEDS HAS ALREADY  Comments (or additional information):WIFE HAS BEEN HERE DAILY AND IS AMAZED AT [place]. FEELS COMFORTABLE WITH HIS DISCHARGE SAT. CHILDREN WILL ALSO BE COMING IN TO ASSIST WITH PT'S CARE AND GIVING MOM A BREAK  Patient/Family verbalized understanding of follow-up arrangements: Yes  Individual responsible for coordination of the follow-up plan: SANDY-WIFE  Confirmed correct DME delivered: Elease Hashimoto 04/13/2016    Elease Hashimoto

## 2016-04-13 NOTE — Plan of Care (Signed)
Problem: RH SKIN INTEGRITY Goal: RH STG MAINTAIN SKIN INTEGRITY WITH ASSISTANCE STG Maintain Skin Integrity With Mod Assistance.   Outcome: Progressing Pt will turn himself regularly, increase activity tolerance and activities, eat a healthy diet in quality and quantity, and ask for assistance with any skin issues.

## 2016-04-13 NOTE — Progress Notes (Signed)
Occupational Therapy Discharge Summary  Patient Details  Name: Herbert Marquez MRN: [id number] Date of Birth: 01/12/1928  Today's Date: 04/13/2016 OT Individual Time: 6734-1937 OT Individual Time Calculation (min): 54 min   Session Note.  Pt completed bathing and dressing during session.  Supervision for all aspects but did require min instructional cueing for hand placement in the RUE when completing transition to sitting from standing.  Educated pt and wife on proper technique to complete all transfers to the toilet and the walk-in shower with use of the tub bench they have currently.  She voices understanding at this time.  Also provided handout and education on FM/gross motor coordination tasks to be completed daily with the LUE.  Pt left in wheelchair with spouse present at end of session.    Patient has met 12 of 12 long term goals due to improved balance, postural control, ability to compensate for deficits, functional use of  RIGHT upper and RIGHT lower extremity, improved awareness and improved coordination.  Patient to discharge at overall Supervision level.  Patient's care partner is independent to provide the necessary physical and cognitive assistance at discharge.    Reasons goals not met: NA  Recommendation:  Patient will benefit from ongoing skilled OT services in home health setting to continue to advance functional skills in the area of BADL.  Pt still demonstrates right hemiparesis with ataxia and needs constant supervision for dynamic balance with use of the RW during transfers and ADL tasks.  Feel he will benefit from continued OT to further progress to modified independent level for most ADLs as he was prior to this CVA.    Equipment: No equipment provided  Reasons for discharge: treatment goals met and discharge from hospital  Patient/family agrees with progress made and goals achieved: Yes  OT Discharge Precautions/Restrictions  Precautions Precautions:  Fall Precaution Comments: RLE flexor tone at the hamstring Restrictions Weight Bearing Restrictions: No  Pain Pain Assessment Pain Assessment: No/denies pain Pain Score: 0-No pain ADL  See Function section of chart for details  Vision/Perception  Vision- History Baseline Vision/History: Wears glasses Wears Glasses: Reading only Patient Visual Report: No change from baseline Vision- Assessment Vision Assessment?: No apparent visual deficits Praxis Praxis-Other Comments: Pt with difficulty at times advancing and placing the RLE with mobility and transfers.   Cognition Overall Cognitive Status: History of cognitive impairments - at baseline Arousal/Alertness: Awake/alert Orientation Level: Oriented X4 Attention: Sustained;Selective Sustained Attention: Appears intact Selective Attention: Appears intact Memory: Impaired Memory Impairment: Decreased recall of new information;Storage deficit Decreased Short Term Memory: Functional basic Awareness: Appears intact Awareness Impairment: Intellectual impairment Problem Solving: Appears intact Problem Solving Impairment: Verbal basic;Functional basic Sequencing: Impaired Sequencing Impairment: Functional basic Organizing: Appears intact Safety/Judgment: Appears intact Comments: Pt still requires cueing for remembering to square himself and the walker up to the surface he is transferring to as well as for reaching back to the arm rests or surface with the RUE.  Sensation Sensation Light Touch: Appears Intact (intact in RUE and hand) Stereognosis: Impaired Detail Stereognosis Impaired Details: Impaired RUE Hot/Cold: Appears Intact Proprioception: Impaired by gross assessment Coordination Gross Motor Movements are Fluid and Coordinated: No Fine Motor Movements are Fluid and Coordinated: No Coordination and Movement Description: Pt still with ataxia noted in the RUE as well as decreased FM and gross motor coordination.  He needs  use of built up foam handle for self feeding as well as increased time and multiple attempts to tie his shoes.  Heel Herbert Marquez  Test: impaired  Motor  Motor Motor: Hemiplegia;Abnormal postural alignment and control Motor - Skilled Clinical Observations: Ataxia in the RLE Motor - Discharge Observations: RUE and RLE ataxia and weakness noted Mobility  Bed Mobility Bed Mobility: Rolling Right;Rolling Left;Supine to Sit;Sit to Supine Rolling Right: 6: Modified independent (Device/Increase time) Rolling Left: 6: Modified independent (Device/Increase time) Supine to Sit: 6: Modified independent (Device/Increase time) Sit to Supine: 6: Modified independent (Device/Increase time) Transfers Transfers: Sit to Stand;Stand to Sit Sit to Stand: 5: Supervision;Without upper extremity assist;From chair/3-in-1;From bed Sit to Stand Details: Verbal cues for technique;Verbal cues for precautions/safety Stand to Sit: 5: Supervision;To chair/3-in-1;With armrests;With upper extremity assist Stand to Sit Details (indicate cue type and reason): Verbal cues for technique;Verbal cues for precautions/safety  Trunk/Postural Assessment  Cervical Assessment Cervical Assessment: Exceptions to [facility] (cervical protraction present) Thoracic Assessment Thoracic Assessment: Exceptions to [facility] (thoracic kyphosis in standing and sitting) Lumbar Assessment Lumbar Assessment: Exceptions to [facility] (maintains lumbar flexion and posterior pelvic tilt in sitting. ) Postural Control Postural Control: Within Functional Limits Trunk Control: Pt with posterior bias in sitting.   Balance Balance Balance Assessed: Yes Static Sitting Balance Static Sitting - Balance Support: Feet supported Static Sitting - Level of Assistance: 6: Modified independent (Device/Increase time) Dynamic Sitting Balance Dynamic Sitting - Balance Support: During functional activity Dynamic Sitting - Level of Assistance: 6: Modified independent (Device/Increase  time) Static Standing Balance Static Standing - Balance Support: No upper extremity supported Static Standing - Level of Assistance: 5: Stand by assistance Dynamic Standing Balance Dynamic Standing - Balance Support: During functional activity Dynamic Standing - Level of Assistance: 5: Stand by assistance Dynamic Standing - Balance Activities: Lateral lean/weight shifting;Forward lean/weight shifting;Reaching for objects Extremity/Trunk Assessment RUE Assessment RUE Assessment: Exceptions to [facility] RUE Strength RUE Overall Strength Comments: Pt with AROM shoulder flexion 0-110 degrees.  Gross digit flexion and extension noted with slight ataxia and tremor present.  Able to oppose thumb to all digits but with decreased smoothness of coordination.  Isolated functional movement in all joints with strength 3+/5 throughout. LUE Assessment LUE Assessment: Within Functional Limits   See Function Navigator for Current Functional Status.  Neng Albee OTR/L 04/13/2016, 12:45 PM

## 2016-04-13 NOTE — Plan of Care (Signed)
Problem: RH BLADDER ELIMINATION Goal: RH STG MANAGE BLADDER WITH ASSISTANCE Pt will successfully Manage Bladder With Mod Assistance without episodes of incontinence.  Outcome: Progressing Pt will request assistance in a timely manner and have no incontinence.

## 2016-04-13 NOTE — Discharge Instructions (Signed)
Inpatient Rehab Discharge Instructions  Herbert Marquez Discharge date and time:  04/13/16  Activities/Precautions/ Functional Status: Activity: no lifting, driving, or strenuous exercise for till cleared by MD Diet: cardiac diet Wound Care: not applicable   Functional status:  ___ No restrictions     ___ Walk up steps independently _X__ 24/7 supervision/assistance   ___ Walk up steps with assistance ___ Intermittent supervision/assistance  ___ Bathe/dress independently ___ Walk with walker     _X__ Bathe/dress with assistance ___ Walk Independently    ___ Shower independently _X__ Walk with supervision    ___ Shower with assistance _X__ No alcohol     ___ Return to work/school ________   Special Instructions: 1. Monitor for any signs of bleeding.    COMMUNITY REFERRALS UPON DISCHARGE:    Home Health:   PT,OT,SP,RN  [place]   Date of last service:04/14/2016  Medical Equipment/Items Ordered:HAS FROM PREVIOUS ADMITS   GENERAL COMMUNITY RESOURCES FOR PATIENT/FAMILY: Support Groups:[facility] SUPPORT GROUP EVERY SECOND Thursday @ 3;00-4:00 PM ON THE REHAB UNIT QUESTIONS CONTACT CAITLYN [id number]  STROKE/TIA DISCHARGE INSTRUCTIONS SMOKING Cigarette smoking nearly doubles your risk of having a stroke & is the single most alterable risk factor  If you smoke or have smoked in the last 12 months, you are advised to quit smoking for your health.  Most of the excess cardiovascular risk related to smoking disappears within a year of stopping.  Ask you doctor about anti-smoking medications  [place] Quit Line: 1-800-QUIT NOW  Free Smoking Cessation Classes [phone]  CHOLESTEROL Know your levels; limit fat & cholesterol in your diet  Lipid Panel     Component Value Date/Time   CHOL 77 03/31/2016 0353   TRIG 37 03/31/2016 0353   HDL 32 (L) 03/31/2016 0353   CHOLHDL 2.4 03/31/2016 0353   VLDL 7 03/31/2016 0353   LDLCALC 38 03/31/2016 0353      Many patients benefit from treatment even if their cholesterol is at goal.  Goal: Total Cholesterol (CHOL) less than 160  Goal:  Triglycerides (TRIG) less than 150  Goal:  HDL greater than 40  Goal:  LDL (LDLCALC) less than 100   BLOOD PRESSURE American Stroke Association blood pressure target is less that 120/80 mm/Hg  Your discharge blood pressure is:  BP: 130/67  Monitor your blood pressure  Limit your salt and alcohol intake  Many individuals will require more than one medication for high blood pressure  DIABETES (A1c is a blood sugar average for last 3 months) Goal HGBA1c is under 7% (HBGA1c is blood sugar average for last 3 months)  Diabetes: No known diagnosis of diabetes    Lab Results  Component Value Date   HGBA1C 5.0 03/31/2016     Your HGBA1c can be lowered with medications, healthy diet, and exercise.  Check your blood sugar as directed by your physician  Call your physician if you experience unexplained or low blood sugars.  PHYSICAL ACTIVITY/REHABILITATION Goal is 30 minutes at least 4 days per week  Activity: No driving, Therapies: see above Return to work: N/A  Activity decreases your risk of heart attack and stroke and makes your heart stronger.  It helps control your weight and blood pressure; helps you relax and can improve your mood.  Participate in a regular exercise program.  Talk with your doctor about the best form of exercise for you (dancing, walking, swimming, cycling).  DIET/WEIGHT Goal is to maintain a healthy weight  Your discharge diet is: Diet  Heart Room service appropriate? Yes; Fluid consistency: Thin; Fluid restriction: 1500 mL Fluid  liquids Your height is:  Height: 5\' 7"  (170.2 cm) Your current weight is: Weight: 76.7 kg (169 lb 3.2 oz) Your Body Mass Index (BMI) is:  BMI (Calculated): 26.6  Following the type of diet specifically designed for you will help prevent another stroke.  You are at goal weight range.   Your goal Body  Mass Index (BMI) is 19-24.  Healthy food habits can help reduce 3 risk factors for stroke:  High cholesterol, hypertension, and excess weight.  RESOURCES Stroke/Support Group:  Call [phone]   STROKE EDUCATION PROVIDED/REVIEWED AND GIVEN TO PATIENT Stroke warning signs and symptoms How to activate emergency medical system (call 911). Medications prescribed at discharge. Need for follow-up after discharge. Personal risk factors for stroke. Pneumonia vaccine given:  Flu vaccine given:  My questions have been answered, the writing is legible, and I understand these instructions.  I will adhere to these goals & educational materials that have been provided to me after my discharge from the hospital.     My questions have been answered and I understand these instructions. I will adhere to these goals and the provided educational materials after my discharge from the hospital.  Patient/Caregiver Signature _______________________________ Date __________  Clinician Signature _______________________________________ Date __________  Please bring this form and your medication list with you to all your follow-up doctor's appointments.   ______________________________________  Information on my medicine - ELIQUIS (apixaban)  This medication education was reviewed with me or my healthcare representative as part of my discharge preparation.   Why was Eliquis prescribed for you? Eliquis was prescribed for you to reduce the risk of a blood clot forming that can cause a stroke if you have a medical condition called atrial fibrillation (a type of irregular heartbeat).  What do You need to know about Eliquis ? Take your Eliquis 5mg  TWICE DAILY - one tablet in the morning and one tablet in the evening with or without food. If you have difficulty swallowing the tablet whole please discuss with your pharmacist how to take the medication safely.  Take Eliquis exactly as prescribed by your doctor  and DO NOT stop taking Eliquis without talking to the doctor who prescribed the medication.  Stopping may increase your risk of developing a stroke.  Refill your prescription before you run out.  After discharge, you should have regular check-up appointments with your healthcare provider that is prescribing your Eliquis.  In the future your dose may need to be changed if your kidney function or weight changes by a significant amount or as you get older.  What do you do if you miss a dose? If you miss a dose, take it as soon as you remember on the same day and resume taking twice daily.  Do not take more than one dose of ELIQUIS at the same time to make up a missed dose.  Important Safety Information A possible side effect of Eliquis is bleeding. You should call your healthcare provider right away if you experience any of the following: ? Bleeding from an injury or your nose that does not stop. ? Unusual colored urine (red or dark brown) or unusual colored stools (red or black). ? Unusual bruising for unknown reasons. ? A serious fall or if you hit your head (even if there is no bleeding).  Some medicines may interact with Eliquis and might increase your risk of bleeding or clotting while on Eliquis. To  help avoid this, consult your healthcare provider or pharmacist prior to using any new prescription or non-prescription medications, including herbals, vitamins, non-steroidal anti-inflammatory drugs (NSAIDs) and supplements.  This website has more information on Eliquis (apixaban): http://[email or web address].com/eliquis/home

## 2016-04-13 NOTE — Progress Notes (Signed)
Physical Therapy Discharge Summary  Patient Details  Name: Herbert Marquez MRN: [id number] Date of Birth: 1928-02-06  Today's Date: 04/13/2016 PT Individual Time: 0903-1000 AND 1500-1545 PT Individual Time Calculation (min): 57 min AND 45 min    Patient has met 12 of 12 long term goals due to improved activity tolerance, improved balance, improved postural control, increased strength, increased range of motion, decreased pain, ability to compensate for deficits, functional use of  right upper extremity and right lower extremity, improved attention, improved awareness and improved coordination.  Patient to discharge at an ambulatory level Supervision for household distances.   Patient's care partner is independent to provide the necessary physical assistance at discharge.   Recommendation:  Patient will benefit from ongoing skilled PT services in home health setting to continue to advance safe functional mobility, address ongoing impairments in balance, strength, coordination, endurance, ambulation, and minimize fall risk.  Equipment: No equipment provided  Reasons for discharge: treatment goals met and discharge from hospital  Patient/family agrees with progress made and goals achieved: Yes   PT Treatment: Pt received sitting in WC and agreeable to PT  Pt instructed patient in Grad day activities to assess progress towards goals; See below for results. Patient overall supervision assist for basic transfers, furnitrure transfers and car transfer, supervisoin assist for ambulation with RW for up to 75, and supervision assist for WC mobility up to 266f. Pt educated in discharge recommendations and follow up therapy.   [place]  PT instructed [place] in [place] exercises with min assist from PT and cues for proper UE support and safety. Pt's wife demonstrated safe gaurding technique for transfers and gait without cues from PT. [facility] returned to [facility] and [facility] call bell in reach and all needs met.      PT Discharge Precautions/Restrictions Precautions Precautions: Fall Precaution Comments: RLE flexor tone at the hamstring Restrictions Weight Bearing Restrictions: No Vital Signs   Pain Pain Assessment Pain Assessment: No/denies pain Pain Score: 0-No pain Vision/Perception     Cognition Overall Cognitive Status: Within Functional Limits for tasks assessed Arousal/Alertness: Awake/alert Orientation Level: Oriented X4 Attention: Selective Selective Attention: Appears intact Memory: Impaired Memory Impairment:  (Baseline deficit remain) Awareness: Appears intact Awareness Impairment: Intellectual impairment Problem Solving: Appears intact Problem Solving Impairment: Verbal basic;Functional basic Sequencing: Appears intact Organizing: Appears intact Safety/Judgment: Appears intact Sensation Sensation Light Touch: Impaired by gross assessment (Distal BLE impaired L>R) Proprioception: Impaired by gross assessment Coordination Gross Motor Movements are Fluid and Coordinated: No Fine Motor Movements are Fluid and Coordinated: No Coordination and Movement Description: Flexor withdrawl of RLE. mild ataxia of the rLE.  Heel Shin Test: impaired  Motor  Motor Motor: Hemiplegia;Abnormal postural alignment and control Motor - Skilled Clinical Observations: Ataxia in the RLE  Mobility Bed Mobility Bed Mobility: Rolling Right;Rolling Left;Supine to Sit;Sit to Supine Rolling Right: 6: Modified independent (Device/Increase time) Rolling Left: 6: Modified independent (Device/Increase time) Supine to Sit: 6: Modified independent (Device/Increase time) Sit to Supine: 6: Modified independent (Device/Increase time) Transfers Transfers: Yes Sit to Stand: 5: Supervision Sit to Stand Details: Verbal cues for technique;Verbal cues for precautions/safety Stand to Sit: 5: Supervision Stand to Sit Details (indicate cue type and reason):  Verbal cues for technique;Verbal cues for precautions/safety Stand Pivot Transfers: 5: Supervision Stand Pivot Transfer Details: Verbal cues for precautions/safety;Verbal cues for technique Locomotion  Ambulation Ambulation: Yes Ambulation/Gait Assistance: 5: Supervision Ambulation Distance (Feet): 77 Feet Assistive device: Rolling walker Ambulation/Gait Assistance  Details: Verbal cues for gait pattern Gait Gait: Yes Gait Pattern: Impaired Gait Pattern: Trunk flexed;Ataxic Stairs / Additional Locomotion Stairs: Yes Stairs Assistance: 4: Min assist Stairs Assistance Details: Verbal cues for technique;Verbal cues for precautions/safety;Tactile cues for placement Stair Management Technique: Two rails Number of Stairs: 4 Height of Stairs: 3 Ramp: 4: Min assist Curb: 4: Min Chemical engineer: Yes Wheelchair Assistance: 5: Investment banker, operational Details: Verbal cues for safe use of DME/AE;Verbal cues for Information systems manager: Both upper extremities Wheelchair Parts Management: Supervision/cueing Distance: 167f  Trunk/Postural Assessment  Cervical Assessment Cervical Assessment: Exceptions to [facility](cerv[facility] protraction) Thoracic Assessment Thoracic Assessment: Exceptions to [facility](increased kyphosis) Lumbar Assessment Lumbar Assessment: Exceptions to [facility](posterior pelvic tilt and flat lumar spine) Postural Control Postural Control: Within Functional Limits  Balance Static Sitting Balance Static Sitting - Level of Assistance: 6: Modified independent (Device/Increase time) Dynamic Sitting Balance Dynamic Sitting - Level of Assistance: 6: Modified independent (Device/Increase time) Static Standing Balance Static Standing - Balance Support: No upper extremity supported Static Standing - Level of Assistance: 5: Stand by assistance Dynamic Standing Balance Dynamic Standing - Balance Support: Right upper extremity  supported Dynamic Standing - Level of Assistance: 5: Stand by assistance Dynamic Standing - Balance Activities: Lateral lean/weight shifting;Forward lean/weight shifting;Reaching for objects Extremity Assessment      RLE Assessment RLE Assessment: Exceptions to [facility] AROM (degrees) RLE Overall AROM Comments: lacking 10d egrees full knee extension all others WFL  RLE PROM (degrees) RLE Overall PROM Comments: lackign 10 degrees  full extension at knee with Flexor tone noted when stretched to end range RLE Strength RLE Overall Strength Comments: grossly 4+/5 for all motions assessed except ankle DF 4-/5 LLE Assessment LLE Assessment: Within Functional Limits   See Function Navigator for Current Functional Status.  ALorie Phenix2/11/2016, 10:04 AM

## 2016-04-14 DIAGNOSIS — I10 Essential (primary) hypertension: Secondary | ICD-10-CM

## 2016-04-14 DIAGNOSIS — G8929 Other chronic pain: Secondary | ICD-10-CM

## 2016-04-14 DIAGNOSIS — I480 Paroxysmal atrial fibrillation: Secondary | ICD-10-CM

## 2016-04-14 DIAGNOSIS — M25562 Pain in left knee: Secondary | ICD-10-CM

## 2016-04-14 NOTE — Progress Notes (Signed)
Subjective/Complaints: Pt seen laying in bed this AM.  Wife at bedside. Pt HOH.  He does state he is ready to go home.  Wife does not report any issues overnight and does not have any further questions.   ROS- Denies CP, SOB, N/V/D  Objective: Vital Signs: Blood pressure 124/76, pulse 71, temperature 98.4 F (36.9 C), temperature source Oral, resp. rate 16, height 5\' 7"  (1.702 m), weight 75.2 kg (165 lb 12.8 oz), SpO2 95 %. Ct Head Wo Contrast  Result Date: 04/12/2016 CLINICAL DATA:  F/u intracerebral hemorrhage EXAM: CT HEAD WITHOUT CONTRAST TECHNIQUE: Contiguous axial images were obtained from the base of the skull through the vertex without intravenous contrast. COMPARISON:  03/31/2016 FINDINGS: Brain: Within the left temporal lobe, there is an isodense hemorrhage now measuring 1.9 x 2.2 cm. On previous exam, hemorrhage measured 1.6 x 2.5 cm. There is surrounding parenchymal edema. Moderate central and cortical atrophy are present. No significant midline shift. Periventricular white matter changes are consistent with small vessel disease. Vascular: There is atherosclerotic calcification of the carotid siphons. Skull: Normal. Negative for fracture or focal lesion. Sinuses/Orbits: No acute finding. Other: None IMPRESSION: 1. Evolving hemorrhage of the left temporal lobe. 2. Persistent surrounding edema without significant midline shift or mass effect. 3. No new hemorrhage. 4. Atrophy and small vessel disease. Electronically Signed   By: Nolon Nations M.D.   On: 04/12/2016 12:37   No results found for this or any previous visit (from the past 72 hour(s)).   HEENT: normocephalic, atraumatic and poor dentition Cardio: RRR and no JVD Resp: CTA B/L and Unlabored GI: BS positive and NT, ND Skin:   Warm and dry.  Neuro: Confused, improving HOH Motor 3/5 RUE and RLE Musc/Skel:  Tenderness in right knee.  Gen NAD. Vital signs reviewed.   Assessment/Plan: 1. 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 Physiatrist is providing close team supervision and 24 hour management of active medical problems listed below. Physiatrist and rehab team continue to assess barriers to discharge/monitor patient progress toward functional and medical goals. FIM: Function - Bathing Position: Shower Body parts bathed by patient: Right arm, Left arm, Chest, Abdomen, Front perineal area, Buttocks, Right upper leg, Left upper leg, Right lower leg, Left lower leg, Back Body parts bathed by helper: Back Bathing not applicable: Back (did not attempt) Assist Level: Supervision or verbal cues  Function- Upper Body Dressing/Undressing What is the patient wearing?: Pull over shirt/dress Pull over shirt/dress - Perfomed by patient: Thread/unthread right sleeve, Thread/unthread left sleeve, Put head through opening, Pull shirt over trunk Pull over shirt/dress - Perfomed by helper: Thread/unthread right sleeve, Thread/unthread left sleeve, Put head through opening Assist Level: Supervision or verbal cues Set up : To obtain clothing/put away Function - Lower Body Dressing/Undressing What is the patient wearing?: Underwear, Pants, Socks Position: Wheelchair/chair at sink Underwear - Performed by patient: Thread/unthread right underwear leg, Thread/unthread left underwear leg, Pull underwear up/down Pants- Performed by patient: Thread/unthread right pants leg, Thread/unthread left pants leg, Pull pants up/down, Fasten/unfasten pants Pants- Performed by helper: Thread/unthread left pants leg, Pull pants up/down Non-skid slipper socks- Performed by patient: Don/doff right sock, Don/doff left sock Socks - Performed by patient: Don/doff right sock, Don/doff left sock Socks - Performed by helper: Don/doff right sock, Don/doff left sock Shoes - Performed by patient:  Don/doff right shoe, Don/doff left shoe, Fasten right  Shoes - Performed by helper: Fasten left Assist for lower body dressing: Supervision or verbal cues  Function - Toileting Toileting activity did not occur: No continent bowel/bladder event (no BM, used urinal) Toileting steps completed by patient: Adjust clothing after toileting, Performs perineal hygiene, Adjust clothing prior to toileting Toileting steps completed by helper: Adjust clothing prior to toileting, Performs perineal hygiene, Adjust clothing after toileting Toileting Assistive Devices: Grab bar or rail Assist level: Supervision or verbal cues  Function - Toilet Transfers Toilet transfer assistive device: Bedside commode, Walker Mechanical lift: Stedy Assist level to toilet: Supervision or verbal cues Assist level from toilet: Supervision or verbal cues Assist level to bedside commode (at bedside): Total assist (Pt < 25%) Assist level from bedside commode (at bedside): Total assist (Pt < 25%)  Function - Chair/bed transfer Chair/bed transfer method: Stand pivot Chair/bed transfer assist level: Supervision or verbal cues Chair/bed transfer assistive device: Armrests, Walker  Function - Locomotion: Wheelchair Will patient use wheelchair at discharge?: Yes Type: Manual Max wheelchair distance: 141ft  Assist Level: Supervision or verbal cues Assist Level: Supervision or verbal cues Wheel 150 feet activity did not occur: Refused Assist Level: Supervision or verbal cues Turns around,maneuvers to table,bed, and toilet,negotiates 3% grade,maneuvers on rugs and over doorsills: No Function - Locomotion: Ambulation Assistive device: Walker-rolling Max distance: 64ft Assist level: Supervision or verbal cues Assist level: Supervision or verbal cues Walk 50 feet with 2 turns activity did not occur: Safety/medical concerns Assist level: Supervision or verbal cues Walk 150 feet activity did not occur: Safety/medical  concerns Walk 10 feet on uneven surfaces activity did not occur: Safety/medical concerns Assist level: Touching or steadying assistance (Pt > 75%)  Function - Comprehension Comprehension: Auditory Comprehension assistive device: Hearing aids Comprehension assist level: Understands basic 90% of the time/cues < 10% of the time  Function - Expression Expression: Verbal Expression assist level: Expresses basic 90% of the time/requires cueing < 10% of the time.  Function - Social Interaction Social Interaction assist level: Interacts appropriately with others with medication or extra time (anti-anxiety, antidepressant).  Function - Problem Solving Problem solving assist level: Solves basic 75 - 89% of the time/requires cueing 10 - 24% of the time  Function - Memory Memory assist level: Recognizes or recalls 75 - 89% of the time/requires cueing 10 - 24% of the time Patient normally able to recall (first 3 days only): Current season, That he or she is in a hospital, Location of own room, Staff names and faces   Medical Problem List and Plan: 1.  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  D/c today  Pt to follow up with Dr. Letta Pate for transitional care management in 1-2 weeks.  2.  DVT Prophylaxis/Anticoagulation: Mechanical: Sequential compression devices, below knee Bilateral lower extremities   Eliquis per Neuro, d/ced ASA 3. Pain Management: tylenol prn. Voltaren gel for knee pain.4. Mood: LCSW to follow for evaluation and support.  5. Neuropsych: This patient not fully capable of making decisions on his own behalf. 6. Skin/Wound Care: routine pressure relief measures 7. Fluids/Electrolytes/Nutrition: Monitor I/O.  8. PAF: I To start ASA on Feb 2nd. Eliquis once hemorrhagic transformation absorbed.   No further tachy episodes 2/10 9. Prediabetes: blood sugars have  improved with weight loss and now Hgb A1c-5.0.  10. Blood pressures: Avoid hypotension  11. Chronic knee pain/history of right hip replacement: Added Voltaren gel for relief.Right knee OA  Now has neoprene sleeve for comfort, has knee orthosis for walking 12. Hyponatremia: ?chronic, d/ced SSRI 13. Iron deficiency anemia: On iron supplement 14. Leukocytosis: Resolved 15.  Insomnia with daytime somnolence, will need to establish normal sleep/wake cycle,      16.  Mild anemia will monitor on ASA, Hgb 2/7  11.9 17. UTI enterococcus, completed amp, has hx prostate CA f/u uro as outpt  LOS (Days) 10 A FACE TO FACE EVALUATION WAS PERFORMED   Lorie Phenix 04/14/2016, 11:29 AM

## 2016-04-14 NOTE — Progress Notes (Signed)
Patient discharged to home with wife about 45. Patient and wife given discharge instructions via Reesa Chew PA yesterday. Patient and wife denied any questions. Patient alert and stable. Patient discharged with all papers and belongings.

## 2016-04-16 DIAGNOSIS — Z96641 Presence of right artificial hip joint: Secondary | ICD-10-CM | POA: Diagnosis not present

## 2016-04-16 DIAGNOSIS — M1990 Unspecified osteoarthritis, unspecified site: Secondary | ICD-10-CM | POA: Diagnosis not present

## 2016-04-16 DIAGNOSIS — K219 Gastro-esophageal reflux disease without esophagitis: Secondary | ICD-10-CM | POA: Diagnosis not present

## 2016-04-16 DIAGNOSIS — I69311 Memory deficit following cerebral infarction: Secondary | ICD-10-CM | POA: Diagnosis not present

## 2016-04-16 DIAGNOSIS — Z8673 Personal history of transient ischemic attack (TIA), and cerebral infarction without residual deficits: Secondary | ICD-10-CM | POA: Diagnosis not present

## 2016-04-16 DIAGNOSIS — Z955 Presence of coronary angioplasty implant and graft: Secondary | ICD-10-CM | POA: Diagnosis not present

## 2016-04-16 DIAGNOSIS — I69320 Aphasia following cerebral infarction: Secondary | ICD-10-CM | POA: Diagnosis not present

## 2016-04-16 DIAGNOSIS — Z87891 Personal history of nicotine dependence: Secondary | ICD-10-CM | POA: Diagnosis not present

## 2016-04-16 DIAGNOSIS — I69351 Hemiplegia and hemiparesis following cerebral infarction affecting right dominant side: Secondary | ICD-10-CM | POA: Diagnosis not present

## 2016-04-16 DIAGNOSIS — I10 Essential (primary) hypertension: Secondary | ICD-10-CM | POA: Diagnosis not present

## 2016-04-16 DIAGNOSIS — E785 Hyperlipidemia, unspecified: Secondary | ICD-10-CM | POA: Diagnosis not present

## 2016-04-16 DIAGNOSIS — I2510 Atherosclerotic heart disease of native coronary artery without angina pectoris: Secondary | ICD-10-CM | POA: Diagnosis not present

## 2016-04-16 DIAGNOSIS — E1151 Type 2 diabetes mellitus with diabetic peripheral angiopathy without gangrene: Secondary | ICD-10-CM | POA: Diagnosis not present

## 2016-04-16 DIAGNOSIS — N390 Urinary tract infection, site not specified: Secondary | ICD-10-CM | POA: Diagnosis not present

## 2016-04-16 DIAGNOSIS — I480 Paroxysmal atrial fibrillation: Secondary | ICD-10-CM | POA: Diagnosis not present

## 2016-04-17 ENCOUNTER — Inpatient Hospital Stay: Payer: Medicare Other | Admitting: Adult Health

## 2016-04-17 ENCOUNTER — Telehealth: Payer: Self-pay

## 2016-04-17 ENCOUNTER — Ambulatory Visit: Payer: Medicare Other | Admitting: Adult Health

## 2016-04-17 DIAGNOSIS — I69320 Aphasia following cerebral infarction: Secondary | ICD-10-CM | POA: Diagnosis not present

## 2016-04-17 DIAGNOSIS — I69311 Memory deficit following cerebral infarction: Secondary | ICD-10-CM | POA: Diagnosis not present

## 2016-04-17 DIAGNOSIS — I2510 Atherosclerotic heart disease of native coronary artery without angina pectoris: Secondary | ICD-10-CM | POA: Diagnosis not present

## 2016-04-17 DIAGNOSIS — I480 Paroxysmal atrial fibrillation: Secondary | ICD-10-CM | POA: Diagnosis not present

## 2016-04-17 DIAGNOSIS — I69351 Hemiplegia and hemiparesis following cerebral infarction affecting right dominant side: Secondary | ICD-10-CM | POA: Diagnosis not present

## 2016-04-17 DIAGNOSIS — E1151 Type 2 diabetes mellitus with diabetic peripheral angiopathy without gangrene: Secondary | ICD-10-CM | POA: Diagnosis not present

## 2016-04-17 NOTE — Telephone Encounter (Signed)
Merry Proud PT [facility] called, requesting orders for Herbert Marquez to receive [facility] PT for 2 for 6wks involving gait training, strengthening, and general functionality.  Called Merry Proud back and gave him verbal orders for the aforementioned

## 2016-04-18 ENCOUNTER — Telehealth: Payer: Self-pay | Admitting: Adult Health

## 2016-04-18 ENCOUNTER — Other Ambulatory Visit (INDEPENDENT_AMBULATORY_CARE_PROVIDER_SITE_OTHER): Payer: Medicare Other | Admitting: Adult Health

## 2016-04-18 ENCOUNTER — Other Ambulatory Visit: Payer: Self-pay | Admitting: Adult Health

## 2016-04-18 DIAGNOSIS — N3001 Acute cystitis with hematuria: Secondary | ICD-10-CM

## 2016-04-18 DIAGNOSIS — R8290 Unspecified abnormal findings in urine: Secondary | ICD-10-CM | POA: Diagnosis not present

## 2016-04-18 DIAGNOSIS — R509 Fever, unspecified: Secondary | ICD-10-CM

## 2016-04-18 LAB — POCT URINALYSIS DIPSTICK
Bilirubin, UA: NEGATIVE
Glucose, UA: NEGATIVE
Ketones, UA: NEGATIVE
Nitrite, UA: NEGATIVE
Spec Grav, UA: 1.015
Urobilinogen, UA: 0.2
pH, UA: 8

## 2016-04-18 MED ORDER — CEFUROXIME AXETIL 250 MG PO TABS: 250.0000 mg | ORAL_TABLET | Freq: Two times a day (BID) | ORAL | 0 refills | Status: DC

## 2016-04-18 NOTE — Telephone Encounter (Signed)
Nurse visit for UA only.  Charyl Bigger, CMA

## 2016-04-18 NOTE — Progress Notes (Signed)
Pt's daughter states that pt is having chills, weakness, fever of 101, urinary frequency, diarrhea and decreased energy.  Daughter provided [place] with urine specimen.  Results entered into chart.  Pt's daughter informed that pt does have a UTI and that we will send in RX to pharmacy.  Charyl Bigger, CMA

## 2016-04-18 NOTE — Telephone Encounter (Signed)
Wife called and said patient is sick and would like to speak with someone clinical about what to do

## 2016-04-18 NOTE — Progress Notes (Signed)
Rx sent. Urine culture sent off, will adjust ABX if needed.   Daughter educated on treatment plan.

## 2016-04-19 ENCOUNTER — Telehealth: Payer: Self-pay

## 2016-04-19 DIAGNOSIS — E1151 Type 2 diabetes mellitus with diabetic peripheral angiopathy without gangrene: Secondary | ICD-10-CM | POA: Diagnosis not present

## 2016-04-19 DIAGNOSIS — I69311 Memory deficit following cerebral infarction: Secondary | ICD-10-CM | POA: Diagnosis not present

## 2016-04-19 DIAGNOSIS — I69351 Hemiplegia and hemiparesis following cerebral infarction affecting right dominant side: Secondary | ICD-10-CM | POA: Diagnosis not present

## 2016-04-19 DIAGNOSIS — I480 Paroxysmal atrial fibrillation: Secondary | ICD-10-CM | POA: Diagnosis not present

## 2016-04-19 DIAGNOSIS — I2510 Atherosclerotic heart disease of native coronary artery without angina pectoris: Secondary | ICD-10-CM | POA: Diagnosis not present

## 2016-04-19 DIAGNOSIS — I69320 Aphasia following cerebral infarction: Secondary | ICD-10-CM | POA: Diagnosis not present

## 2016-04-19 NOTE — Telephone Encounter (Signed)
Follow up call to pt's spouse, who states that pt is doing much better this morning, is no longer having a fever, is more alert and is having decreased urinary frequency.  Advised spouse to schedule follow up appt for 04/23/16.  Spouse expressed understanding and is agreeable.  Charyl Bigger, CMA

## 2016-04-20 DIAGNOSIS — I69311 Memory deficit following cerebral infarction: Secondary | ICD-10-CM | POA: Diagnosis not present

## 2016-04-20 DIAGNOSIS — I69351 Hemiplegia and hemiparesis following cerebral infarction affecting right dominant side: Secondary | ICD-10-CM | POA: Diagnosis not present

## 2016-04-20 DIAGNOSIS — I480 Paroxysmal atrial fibrillation: Secondary | ICD-10-CM | POA: Diagnosis not present

## 2016-04-20 DIAGNOSIS — I2510 Atherosclerotic heart disease of native coronary artery without angina pectoris: Secondary | ICD-10-CM | POA: Diagnosis not present

## 2016-04-20 DIAGNOSIS — E1151 Type 2 diabetes mellitus with diabetic peripheral angiopathy without gangrene: Secondary | ICD-10-CM | POA: Diagnosis not present

## 2016-04-20 DIAGNOSIS — I69320 Aphasia following cerebral infarction: Secondary | ICD-10-CM | POA: Diagnosis not present

## 2016-04-23 LAB — CULTURE, URINE COMPREHENSIVE

## 2016-04-24 ENCOUNTER — Encounter: Payer: Self-pay | Admitting: Adult Health

## 2016-04-24 ENCOUNTER — Ambulatory Visit (INDEPENDENT_AMBULATORY_CARE_PROVIDER_SITE_OTHER): Payer: Medicare Other | Admitting: Adult Health

## 2016-04-24 ENCOUNTER — Telehealth: Payer: Self-pay | Admitting: Neurology

## 2016-04-24 VITALS — BP 159/79 | HR 77 | Resp 20 | Wt 165.0 lb

## 2016-04-24 DIAGNOSIS — N3001 Acute cystitis with hematuria: Secondary | ICD-10-CM

## 2016-04-24 DIAGNOSIS — I639 Cerebral infarction, unspecified: Secondary | ICD-10-CM | POA: Diagnosis not present

## 2016-04-24 DIAGNOSIS — I619 Nontraumatic intracerebral hemorrhage, unspecified: Secondary | ICD-10-CM

## 2016-04-24 NOTE — Telephone Encounter (Signed)
[name] [phone] called said the pt's wife called their office, said the pt could not be seen until May. I advised him an appt was offered tomorrow at 11:30 but she declined. He said she did not tell me that. Dorothea Ogle said he will talk with PCP and Rn, check with the wife and try to get them to take tomorrow's appt. I will call him back a few min before 5pm today to find out the outcome.

## 2016-04-24 NOTE — Assessment & Plan Note (Signed)
Continue Apixaban 5mg  PO daily. Continue with twice weekly home PT. Please make f/u appt. With Dr. Erlinda Hong ASAP.

## 2016-04-24 NOTE — Progress Notes (Signed)
Subjective:    Patient ID: Herbert Marquez, male    DOB: [birth year], 83 y.o.   MRN: [id number]  HPI:  [name] presents for f/u after CVA and subsequent hospitalization.  He is compliant on all medications and denies SE.  He denies CP/dyspnea/at rest.  He denies urinary frequency, flank pain, or hematuria. He denies HA, confusion, however does report mild aphasia that is slowly improving weekly.  He states he "feels pretty darn good even though I have been through a lot recently".  He has had home PT 2 x week and has not scheduled f/iu with Neurologist-per hospital d/c instructions.  Wife at [facility].    Patient Care Team    Relationship Specialty Notifications Start End  Odella Aquas, NP PCP - General Family Medicine  03/20/16   Thayer Headings, MD Consulting Physician Cardiology  03/20/16   Rod Can, MD Consulting Physician Orthopedic Surgery  03/20/16   Jarome Matin, MD Consulting Physician Dermatology  03/20/16   Nickie Retort, MD Consulting Physician Urology  03/20/16   Shon Hough, MD Consulting Physician Ophthalmology  03/20/16     Patient Active Problem List   Diagnosis Date Noted  . PAF (paroxysmal atrial fibrillation) ([place])   . Chronic pain of left knee   . Enterococcus UTI 04/12/2016  . Iron deficiency anemia 04/04/2016  . Stroke (cerebrum) ([place]) 04/04/2016  . Prediabetes   . Chronic pain of right knee   . Cerebral hemorrhage (HCC) w/ SDH s/p IV tPA   . Coronary artery disease involving native coronary artery of native heart without angina pectoris   . Paroxysmal atrial fibrillation with RVR ([place])   . Labile blood pressure   . Hypotension   . Controlled type 2 DM with peripheral circulatory disorder ([place])   . History of right hip replacement   . Acute blood loss anemia   . Acute ischemic stroke ([place]) - L temporal lobe s/p tPA 03/30/2016  . Persistent atrial fibrillation ([place])   . Displaced intertrochanteric fracture of right femur, initial encounter for closed  fracture ([place]) 11/26/2015  . Protein-calorie malnutrition, severe 11/26/2015  . Closed right hip fracture ([place]) 11/25/2015  . BPH (benign prostatic hyperplasia) 11/25/2015  . GERD (gastroesophageal reflux disease) 11/25/2015  . CKD (chronic kidney disease), stage II 11/25/2015  . Closed left hip fracture ([place]) 11/25/2015  . Citrobacter infection   . Controlled diabetes mellitus type 2 with complications ([place])   . Urinary frequency   . Sepsis ([place]) 11/05/2015  . Overactive bladder   . Sepsis, unspecified organism ([place]) 11/04/2015  . H/O: CVA (cerebrovascular accident) 11/04/2015  . Palpitations 09/18/2015  . Cerebrovascular accident (CVA) due to bilateral occlusion of posterior cerebral arteries ([place]) 09/18/2015  . DM type 2 with diabetic peripheral neuropathy ([place])   . Gait disturbance, post-stroke 06/23/2015  . 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 Alterations of sensations following CVA (cerebrovascular accident) 06/23/2015  . Thalamic infarction ([place]) 06/21/2015  . Sinus tachycardia 06/20/2015  . Benign essential HTN   . Type 2 diabetes mellitus with complication, without long-term current use of insulin ([place])   . CAD in native artery   . Dysphagia as late effect of cerebrovascular disease   . Leukocytosis   . Hypokalemia   . Hyponatremia   . AKI (acute kidney injury) ([place])   . PSVT (paroxysmal supraventricular tachycardia) ([place])   . HLD (hyperlipidemia)   . Syncope 06/16/2015  . UTI (urinary tract infection) 08/31/2011  . Fracture  of femoral neck, left ([place]) 03/09/2011  . Obesity 03/09/2011  . COPD (chronic obstructive pulmonary disease) ([place]) 03/09/2011     Past Medical History:  Diagnosis Date  . Arthritis    "pretty much all over"   . Basal cell carcinoma    "several burned off his body, face, head"  . BPH (benign prostatic hypertrophy)   . Coronary artery disease    a. s/p PCI of RCA in 2006  . CVA (cerebral infarction)    a. 06/2015: left thalamic and  bilateral PCA  . GERD (gastroesophageal reflux disease)   . Hyperlipidemia   . Hypertension   . TIA (transient ischemic attack)    Approximately 6 weeks post-cardiac catheterization.   . Type II diabetes mellitus ([place])    "prediabetic; lost alot of weight; not diabetic now" (06/16/2015)     Past Surgical History:  Procedure Laterality Date  . CARDIOVASCULAR STRESS TEST  07/01/2007   EF 74%  . CATARACT EXTRACTION, BILATERAL    . CORONARY ANGIOPLASTY WITH STENT PLACEMENT  10/2004   stenting x 2 to RCA  . FEMUR IM NAIL Right 11/26/2015   Procedure: INTRAMEDULLARY RIGHT (IM) NAIL FEMORAL;  Surgeon: Rod Can, MD;  Location: [facility];  Service: Orthopedics;  Laterality: Right;  . HERNIA REPAIR    . HIP ARTHROPLASTY  03/09/2011   Procedure: ARTHROPLASTY BIPOLAR HIP;  Surgeon: Mauri Pole;  Location: [facility];  Service: Orthopedics;  Laterality: Left;  . LAPAROSCOPIC INCISIONAL / UMBILICAL / VENTRAL HERNIA REPAIR     "below his naval"     Family History  Problem Relation Age of Onset  . Hypertension Mother   . Lung cancer Father   . Lung cancer Brother      History  Drug Use No     History  Alcohol Use  . 4.8 oz/week  . 1 Cans of beer, 7 Glasses of wine per week    Comment: 1/2 BEER AND 1 WINE     History  Smoking Status  . Former Smoker  . Quit date: 03/06/1971  Smokeless Tobacco  . Never Used     Outpatient Encounter Prescriptions as of 04/24/2016  Medication Sig Note  . acetaminophen (TYLENOL) 500 MG tablet Take 1 tablet (500 mg total) by mouth every 6 (six) hours as needed for mild pain.   [name] apixaban (ELIQUIS) 5 MG TABS tablet Take 1 tablet (5 mg total) by mouth 2 (two) times daily.   [name] atorvastatin (LIPITOR) 80 MG tablet Take 1 tablet (80 mg total) by mouth daily.   . cefUROXime (CEFTIN) 250 MG tablet Take 1 tablet (250 mg total) by mouth 2 (two) times daily with a meal.   . diclofenac sodium (VOLTAREN) 1 % GEL Apply 2 g topically 3 (three) times daily before  meals.   . ferrous sulfate 325 (65 FE) MG tablet Take 1 tablet (325 mg total) by mouth 2 (two) times daily with a meal.   . Multiple Vitamin (MULTIVITAMIN) tablet Take 1 tablet by mouth daily.    [name] MYRBETRIQ 50 MG TB24 tablet Take 1 tablet by mouth daily. 03/20/2016: Received from: External Pharmacy  . nitroGLYCERIN (NITROSTAT) 0.4 MG SL tablet Place 0.4 mg under the tongue every 5 (five) minutes as needed for chest pain.   . pantoprazole (PROTONIX) 40 MG tablet Take 1 tablet (40 mg total) by mouth daily.   . tamsulosin (FLOMAX) 0.4 MG CAPS capsule Take 0.4 mg by mouth every other day.  No facility-administered encounter medications on file as of 04/24/2016.     Allergies: Patient has no known allergies.  Body mass index is 25.84 kg/m.  Blood pressure (!) 159/79, pulse 77, resp. rate 20, weight 165 lb (74.8 kg), SpO2 98 %.   Review of Systems  Constitutional: Positive for activity change and fatigue. Negative for appetite change, chills, diaphoresis, fever and unexpected weight change.  HENT: Negative for congestion.   Eyes: Negative for visual disturbance.  Respiratory: Negative for cough and shortness of breath.   Cardiovascular: Positive for leg swelling. Negative for chest pain and palpitations.  Gastrointestinal: Negative for abdominal distention, abdominal pain, blood in stool, constipation, diarrhea, nausea and vomiting.  Endocrine: Negative for cold intolerance, heat intolerance, polydipsia, polyphagia and polyuria.  Genitourinary: Negative for difficulty urinating, dysuria, flank pain and hematuria.  Musculoskeletal: Positive for arthralgias, back pain, gait problem, joint swelling, myalgias, neck pain and neck stiffness.  Skin: Negative for color change, pallor, rash and wound.  Neurological: Positive for speech difficulty and weakness. Negative for dizziness, tremors, syncope, facial asymmetry, numbness and headaches.  Psychiatric/Behavioral: Negative for agitation,  behavioral problems, confusion and sleep disturbance. The patient is not nervous/anxious.        Objective:   Physical Exam  Constitutional: He is oriented to person, place, and time. He appears well-developed and well-nourished. No distress.  HENT:  Head: Normocephalic and atraumatic.  Eyes: Conjunctivae and EOM are normal. Pupils are equal, round, and reactive to light.  Cardiovascular: Normal rate, regular rhythm, normal heart sounds and intact distal pulses.   Pulmonary/Chest: Effort normal and breath sounds normal. No respiratory distress. He has no wheezes. He has no rales. He exhibits no tenderness.  Musculoskeletal: He exhibits edema and tenderness.       Right hip: He exhibits tenderness and swelling.       Right knee: He exhibits decreased range of motion and swelling.       Lumbar back: He exhibits tenderness.  Neurological: He is alert and oriented to person, place, and time. He has normal reflexes. Coordination abnormal.  Skin: Skin is warm and dry. No rash noted. He is not diaphoretic. No erythema. No pallor.  Psychiatric: He has a normal mood and affect. His behavior is normal. Judgment and thought content normal.  Nursing note and vitals reviewed.         Assessment & Plan:   1. Cerebral hemorrhage (HCC) w/ SDH s/p IV tPA   2. Acute cystitis with hematuria     Cerebral hemorrhage (HCC) w/ SDH s/p IV tPA Continue Apixaban 5mg  PO daily. Continue with twice weekly home PT. Please make f/u appt. With Dr. Erlinda Hong ASAP.   UTI (urinary tract infection) Complete course of Cefuroxime and continue to push fluids.     FOLLOW-UP:  Return in about 3 months (around 07/22/2016) for Regular Follow Up.

## 2016-04-24 NOTE — Telephone Encounter (Signed)
Pt's wife called to schedule f/u appt for stroke. appt for tomorrow 2/21/@ 11:30 was offered, she declined. She said it is hard to get him to come to appt. Next available is May 14. She asked why does he need to be seen? Is it important. I told her he should have been seen 3 mths after he was seen in July 2017. She said "I will wait and let Dr Read Drivers decide what he thinks".  FYI

## 2016-04-24 NOTE — Patient Instructions (Addendum)
Healthy Eating to Prevent Digestive Disorders The digestive system starts at the mouth and goes all the way down to the rectum. Along the way, your digestive system breaks down the food you eat so you can absorb its nutrients and use them for energy. Digestive disorders can cause gas, bloating, pain, heartburn, and other symptoms. They can prevent your digestive system from doing its job. Healthy eating and a healthy lifestyle can help you avoid many common digestive disorders. What nutrition changes can be made? Start by eating a balanced diet. Eat healthy foods from all the major food groups. These include carbohydrates, fats, and proteins. Other changes you can make include to:  Eat enough fiber. Fiber is a healthy carbohydrate that cleans out your digestive system. Fiber absorbs water and helps you have regular bowel movements. Fiber comes from plants. To get enough fiber in your diet, eat 4-5 servings of fruits, vegetables, and legumes every day. Include beans and whole grains. Most people should get 20?35 grams of fiber each day.  Drink enough water to keep your urine clear or pale yellow. Water helps your body digest food. It can also help prevent constipation.  Avoid fatty proteins. Full-fat dairy products and fatty meats are hard to digest. Fats you want to avoid are those that get solid at room temperature (saturated fats). Instead of eating these kinds of fats, eat plant-based unsaturated fats found in olives, canola, corn, avocado, and nuts.  If you have trouble with gas, belching, or flatulence, avoid gas-producing foods. These include beans, carbonated beverages, cabbage, cauliflower, and broccoli. If you are lactose intolerant, avoid dairy products or choose lactose-free dairy products.  If you have frequent heartburn, stay away from alcohol, caffeine, fatty foods, chocolate, and peppermint. Avoid lying down within two hours of eating a full meal. Overeating and lying down too soon after  a meal can cause heartburn.  Add probiotics to your diet. Healthy digestion depends on having the right balance of good bacteria in your colon. Probiotics can help restore the balance of good bacteria in your digestive system. Probiotics are live active cultures that are found in yogurt, kefir, and cultured foods like sauerkraut and miso. You can also add good bacteria with probiotic supplements.  Make sure to chew your food slowly and completely.  Instead of eating three large meals each day, eat three small meals with three small snacks. What other changes can I make? You can help your digestive system stay healthy by making these lifestyle changes:  Stay active and exercise every day.  Maintain a healthy weight.  Eat on a regular schedule.  Avoid tight-fitting clothes. They can restrict digestion.  If you have frequent heartburn, raise the head of your bed 2-3 inches (5-7.5 cm).  Do not use any tobacco products, such as cigarettes, chewing tobacco, and e-cigarettes. If you need help quitting, ask your health care provider.  Limit alcohol intake to no more than 1 drink a day for nonpregnant women and 2 drinks a day for men. One drink equals 12 oz of beer, 5 oz of wine, or 1 oz of hard liquor.  Avoid stress. Find ways to reduce stress, such as meditation, exercise, or taking time for activities that relax you. Why should I make these changes? Making these changes will help your digestive system function at its best. A healthy digestive system can help you avoid or improve your management of digestive disorders such as:  Bloating, gas, and flatulence.  Heartburn.  Gastroesophageal reflux disease (GERD).  Peptic ulcer disease.  Hemorrhoids.  Diverticulitis.  Constipation.  Diarrhea.  Gall stones  Irritable bowel syndrome.  Malnutrition.  Fatty liver disease. What can happen if changes are not made? Not making these changes could put you at risk for many conditions  caused by a poor diet or an unhealthy weight, such as heart disease, stroke and diabetes. Where can I get more information? Learn more about healthy eating and digestive disorders by visiting these websites:  Academy of Nutrition and Dietetics: DenimDistribution.com.ee  Centers for Disease Control and Prevention: CoinSpecialists.co.za  U.S. Department of Health and Human Services: St[place]CarWash.com.cy.pdf Summary  A heathy diet can help prevent many digestive disorders.  Eat a balanced diet consisting of fiber, unsaturated fats, lean protein, fruits, and vegetables.  Eat three small meals with three small snacks per day.  Drink plenty of water every day.  Get plenty of exercise and maintain a healthy weight. This information is not intended to replace advice given to you by your health care provider. Make sure you discuss any questions you have with your health care provider. Document Released: 03/18/2015 Document Revised: 07/28/2015 Document Reviewed: 11/02/2015 Elsevier Interactive Patient Education  2017 [place] all medications as directed. Continue with home PT - 2 x week. Please make appt. With Neurologist/ Dr. Erlinda Hong. Will call when CBC results are available. Please follow-up with [place] in 3 months, or sooner if needed.

## 2016-04-24 NOTE — Assessment & Plan Note (Signed)
Complete course of Cefuroxime and continue to push fluids.

## 2016-04-25 ENCOUNTER — Telehealth: Payer: Self-pay | Admitting: Neurology

## 2016-04-25 ENCOUNTER — Ambulatory Visit: Payer: Medicare Other | Admitting: Neurology

## 2016-04-25 DIAGNOSIS — I69311 Memory deficit following cerebral infarction: Secondary | ICD-10-CM | POA: Diagnosis not present

## 2016-04-25 DIAGNOSIS — E1151 Type 2 diabetes mellitus with diabetic peripheral angiopathy without gangrene: Secondary | ICD-10-CM | POA: Diagnosis not present

## 2016-04-25 DIAGNOSIS — I69351 Hemiplegia and hemiparesis following cerebral infarction affecting right dominant side: Secondary | ICD-10-CM | POA: Diagnosis not present

## 2016-04-25 DIAGNOSIS — I2510 Atherosclerotic heart disease of native coronary artery without angina pectoris: Secondary | ICD-10-CM | POA: Diagnosis not present

## 2016-04-25 DIAGNOSIS — I480 Paroxysmal atrial fibrillation: Secondary | ICD-10-CM | POA: Diagnosis not present

## 2016-04-25 DIAGNOSIS — I69320 Aphasia following cerebral infarction: Secondary | ICD-10-CM | POA: Diagnosis not present

## 2016-04-25 LAB — CBC WITH DIFFERENTIAL/PLATELET
Basophils Absolute: 0 10*3/uL (ref 0.0–0.2)
Basos: 0 %
EOS (ABSOLUTE): 0.3 10*3/uL (ref 0.0–0.4)
Eos: 3 %
Hematocrit: 34.4 % — ABNORMAL LOW (ref 37.5–51.0)
Hemoglobin: 11.2 g/dL — ABNORMAL LOW (ref 13.0–17.7)
Immature Grans (Abs): 0 10*3/uL (ref 0.0–0.1)
Immature Granulocytes: 0 %
Lymphocytes Absolute: 1.4 10*3/uL (ref 0.7–3.1)
Lymphs: 16 %
MCH: 29.6 pg (ref 26.6–33.0)
MCHC: 32.6 g/dL (ref 31.5–35.7)
MCV: 91 fL (ref 79–97)
Monocytes Absolute: 0.9 10*3/uL (ref 0.1–0.9)
Monocytes: 9 %
Neutrophils Absolute: 6.6 10*3/uL (ref 1.4–7.0)
Neutrophils: 72 %
Platelets: 464 10*3/uL — ABNORMAL HIGH (ref 150–379)
RBC: 3.79 x10E6/uL — ABNORMAL LOW (ref 4.14–5.80)
RDW: 14.7 % (ref 12.3–15.4)
WBC: 9.2 10*3/uL (ref 3.4–10.8)

## 2016-04-25 NOTE — Telephone Encounter (Signed)
[facility] [phone], pt's PCP called this morning, they were unsuccessful at getting pt's PT appt r/s today, they were working on it yesterday before 5. The patient is unable to come today, PCP is wanting the pt seen asap since he had a 2nd stroke in January 2018. Can this patient be seen by NP tomorrow? Please see tele note from yesterday. Thank you

## 2016-04-25 NOTE — Telephone Encounter (Signed)
I called Herbert Marquez back yesterday before leaving. He said PCP was trying to get PT r/s so the patient could come the appt today. He said to schedule the appt (2/21 @ 11:30), he would call this morning if they could not get the PT appt moved to another day and c/a it.   FYI

## 2016-04-25 NOTE — Telephone Encounter (Signed)
Spoke with Dr. Erlinda Hong, ok to see pt today at 1130 (per request of pcp).  Not able to see per pcp, due to could not get PT rescheduled.  Will call for cancellation and see if pt available.  No availability with NP tomorrow.

## 2016-04-26 DIAGNOSIS — I69311 Memory deficit following cerebral infarction: Secondary | ICD-10-CM | POA: Diagnosis not present

## 2016-04-26 DIAGNOSIS — I69320 Aphasia following cerebral infarction: Secondary | ICD-10-CM | POA: Diagnosis not present

## 2016-04-26 DIAGNOSIS — I480 Paroxysmal atrial fibrillation: Secondary | ICD-10-CM | POA: Diagnosis not present

## 2016-04-26 DIAGNOSIS — E1151 Type 2 diabetes mellitus with diabetic peripheral angiopathy without gangrene: Secondary | ICD-10-CM | POA: Diagnosis not present

## 2016-04-26 DIAGNOSIS — I2510 Atherosclerotic heart disease of native coronary artery without angina pectoris: Secondary | ICD-10-CM | POA: Diagnosis not present

## 2016-04-26 DIAGNOSIS — I69351 Hemiplegia and hemiparesis following cerebral infarction affecting right dominant side: Secondary | ICD-10-CM | POA: Diagnosis not present

## 2016-04-30 DIAGNOSIS — E1151 Type 2 diabetes mellitus with diabetic peripheral angiopathy without gangrene: Secondary | ICD-10-CM | POA: Diagnosis not present

## 2016-04-30 DIAGNOSIS — I69320 Aphasia following cerebral infarction: Secondary | ICD-10-CM | POA: Diagnosis not present

## 2016-04-30 DIAGNOSIS — I2510 Atherosclerotic heart disease of native coronary artery without angina pectoris: Secondary | ICD-10-CM | POA: Diagnosis not present

## 2016-04-30 DIAGNOSIS — I480 Paroxysmal atrial fibrillation: Secondary | ICD-10-CM | POA: Diagnosis not present

## 2016-04-30 DIAGNOSIS — I69311 Memory deficit following cerebral infarction: Secondary | ICD-10-CM | POA: Diagnosis not present

## 2016-04-30 DIAGNOSIS — I69351 Hemiplegia and hemiparesis following cerebral infarction affecting right dominant side: Secondary | ICD-10-CM | POA: Diagnosis not present

## 2016-05-03 DIAGNOSIS — I69320 Aphasia following cerebral infarction: Secondary | ICD-10-CM | POA: Diagnosis not present

## 2016-05-03 DIAGNOSIS — I69311 Memory deficit following cerebral infarction: Secondary | ICD-10-CM | POA: Diagnosis not present

## 2016-05-03 DIAGNOSIS — I480 Paroxysmal atrial fibrillation: Secondary | ICD-10-CM | POA: Diagnosis not present

## 2016-05-03 DIAGNOSIS — I2510 Atherosclerotic heart disease of native coronary artery without angina pectoris: Secondary | ICD-10-CM | POA: Diagnosis not present

## 2016-05-03 DIAGNOSIS — I69351 Hemiplegia and hemiparesis following cerebral infarction affecting right dominant side: Secondary | ICD-10-CM | POA: Diagnosis not present

## 2016-05-03 DIAGNOSIS — E1151 Type 2 diabetes mellitus with diabetic peripheral angiopathy without gangrene: Secondary | ICD-10-CM | POA: Diagnosis not present

## 2016-05-07 DIAGNOSIS — I480 Paroxysmal atrial fibrillation: Secondary | ICD-10-CM | POA: Diagnosis not present

## 2016-05-07 DIAGNOSIS — I69351 Hemiplegia and hemiparesis following cerebral infarction affecting right dominant side: Secondary | ICD-10-CM | POA: Diagnosis not present

## 2016-05-07 DIAGNOSIS — I69320 Aphasia following cerebral infarction: Secondary | ICD-10-CM | POA: Diagnosis not present

## 2016-05-07 DIAGNOSIS — I2510 Atherosclerotic heart disease of native coronary artery without angina pectoris: Secondary | ICD-10-CM | POA: Diagnosis not present

## 2016-05-07 DIAGNOSIS — I69311 Memory deficit following cerebral infarction: Secondary | ICD-10-CM | POA: Diagnosis not present

## 2016-05-07 DIAGNOSIS — E1151 Type 2 diabetes mellitus with diabetic peripheral angiopathy without gangrene: Secondary | ICD-10-CM | POA: Diagnosis not present

## 2016-05-08 DIAGNOSIS — I69351 Hemiplegia and hemiparesis following cerebral infarction affecting right dominant side: Secondary | ICD-10-CM | POA: Diagnosis not present

## 2016-05-08 DIAGNOSIS — I69311 Memory deficit following cerebral infarction: Secondary | ICD-10-CM | POA: Diagnosis not present

## 2016-05-08 DIAGNOSIS — I69320 Aphasia following cerebral infarction: Secondary | ICD-10-CM | POA: Diagnosis not present

## 2016-05-08 DIAGNOSIS — I2510 Atherosclerotic heart disease of native coronary artery without angina pectoris: Secondary | ICD-10-CM | POA: Diagnosis not present

## 2016-05-10 DIAGNOSIS — I69311 Memory deficit following cerebral infarction: Secondary | ICD-10-CM | POA: Diagnosis not present

## 2016-05-10 DIAGNOSIS — I69320 Aphasia following cerebral infarction: Secondary | ICD-10-CM | POA: Diagnosis not present

## 2016-05-10 DIAGNOSIS — E1151 Type 2 diabetes mellitus with diabetic peripheral angiopathy without gangrene: Secondary | ICD-10-CM | POA: Diagnosis not present

## 2016-05-10 DIAGNOSIS — I69351 Hemiplegia and hemiparesis following cerebral infarction affecting right dominant side: Secondary | ICD-10-CM | POA: Diagnosis not present

## 2016-05-10 DIAGNOSIS — I2510 Atherosclerotic heart disease of native coronary artery without angina pectoris: Secondary | ICD-10-CM | POA: Diagnosis not present

## 2016-05-10 DIAGNOSIS — I480 Paroxysmal atrial fibrillation: Secondary | ICD-10-CM | POA: Diagnosis not present

## 2016-05-11 ENCOUNTER — Inpatient Hospital Stay: Payer: Medicare Other | Admitting: Physical Medicine & Rehabilitation

## 2016-05-11 ENCOUNTER — Telehealth: Payer: Self-pay | Admitting: Adult Health

## 2016-05-11 MED ORDER — ATORVASTATIN CALCIUM 80 MG PO TABS: 80.0000 mg | ORAL_TABLET | Freq: Every day | ORAL | 0 refills | Status: DC

## 2016-05-11 NOTE — Addendum Note (Signed)
Addended by: Fonnie Mu on: 05/11/2016 10:40 AM   Modules accepted: Orders

## 2016-05-11 NOTE — Telephone Encounter (Signed)
Patient is requesting a refill of his atorvastatin 80 mg

## 2016-05-15 DIAGNOSIS — E1151 Type 2 diabetes mellitus with diabetic peripheral angiopathy without gangrene: Secondary | ICD-10-CM | POA: Diagnosis not present

## 2016-05-15 DIAGNOSIS — I69311 Memory deficit following cerebral infarction: Secondary | ICD-10-CM | POA: Diagnosis not present

## 2016-05-15 DIAGNOSIS — I480 Paroxysmal atrial fibrillation: Secondary | ICD-10-CM | POA: Diagnosis not present

## 2016-05-15 DIAGNOSIS — I69351 Hemiplegia and hemiparesis following cerebral infarction affecting right dominant side: Secondary | ICD-10-CM | POA: Diagnosis not present

## 2016-05-15 DIAGNOSIS — I2510 Atherosclerotic heart disease of native coronary artery without angina pectoris: Secondary | ICD-10-CM | POA: Diagnosis not present

## 2016-05-15 DIAGNOSIS — I69320 Aphasia following cerebral infarction: Secondary | ICD-10-CM | POA: Diagnosis not present

## 2016-05-17 ENCOUNTER — Encounter: Payer: Self-pay | Admitting: Cardiovascular Disease

## 2016-05-17 ENCOUNTER — Ambulatory Visit (INDEPENDENT_AMBULATORY_CARE_PROVIDER_SITE_OTHER): Payer: Medicare Other | Admitting: Cardiovascular Disease

## 2016-05-17 VITALS — BP 140/86 | HR 69 | Ht 67.0 in

## 2016-05-17 DIAGNOSIS — I480 Paroxysmal atrial fibrillation: Secondary | ICD-10-CM

## 2016-05-17 DIAGNOSIS — E782 Mixed hyperlipidemia: Secondary | ICD-10-CM

## 2016-05-17 DIAGNOSIS — I69320 Aphasia following cerebral infarction: Secondary | ICD-10-CM | POA: Diagnosis not present

## 2016-05-17 DIAGNOSIS — I481 Persistent atrial fibrillation: Secondary | ICD-10-CM

## 2016-05-17 DIAGNOSIS — I639 Cerebral infarction, unspecified: Secondary | ICD-10-CM | POA: Diagnosis not present

## 2016-05-17 DIAGNOSIS — I4819 Other persistent atrial fibrillation: Secondary | ICD-10-CM

## 2016-05-17 DIAGNOSIS — I2510 Atherosclerotic heart disease of native coronary artery without angina pectoris: Secondary | ICD-10-CM

## 2016-05-17 DIAGNOSIS — E1151 Type 2 diabetes mellitus with diabetic peripheral angiopathy without gangrene: Secondary | ICD-10-CM | POA: Diagnosis not present

## 2016-05-17 DIAGNOSIS — I69311 Memory deficit following cerebral infarction: Secondary | ICD-10-CM | POA: Diagnosis not present

## 2016-05-17 DIAGNOSIS — I69351 Hemiplegia and hemiparesis following cerebral infarction affecting right dominant side: Secondary | ICD-10-CM | POA: Diagnosis not present

## 2016-05-17 NOTE — Progress Notes (Signed)
Cardiology Office Note   Date:  05/17/2016   ID:  Herbert Marquez, DOB 01/13/1928, MRN [id number]  PCP:  Herbert Anes, NP  Cardiologist:   Herbert Moores, MD   Chief Complaint  Patient presents with  . Follow-up    CAD, HTN    Problem list:  1. Coronary artery disease-status post PTCA and stenting of the RCA in [place], [place]  2. Dyslipidemia  3. Hypertension  4. Diabetes mellitus  5. Hernia repair   Prior Notes:   Pt has done well from a cardiac standpoint. He fell and broke his left hip in January . His is recovering well. He is still walking with a cane. He had no complications during or after surgery.  He had a severe UTI in June / July of 2013. He has been seen by Urology and was started on Tamulosin for BPH.  He's not had any episodes of chest pain or shortness breath. He still walks with a cane but is able to get out and stays fairly active.  October 02, 2012:  Herbert Marquez is doing well. No CP , no dyspnea. He remains active - went to the beach last week. He walks at [facility] when he is down there. Goes to the [facility] 3 times a week. No angina.  Jan. 30, 2015:  Herbert Marquez is doing well. He has not had any cardiac problems. He has been to the beach recently.  He goes to the [facility] 2-3 times a week. He doesn't have any angina. His primary medical doctor is managing his lipids.    Feb. 18, 2016:   Herbert Marquez is a 83 y.o. male who presents for follow up of his CAD. NO CP or dyspnea.    Limited by leg and back pains.  He and his wife celebrated their 59th wedding anniversary.   They went to [facility] last week.    October 25, 2014:  Doing well from a cardiac standpoint..  Has a circk in his neck Staying buys,   Still has his place at [facility].   May 10, 2015: Doing great. Some DOE  Still going down to [facility]  No CP, Wants refill on NTG - lost his bottle  May 17, 2016:   Has had a rough year since I last saw him  Has had 2 strokes since I last saw  him  Has severe weakness of his right side now ( 1st stroke)  The 2nd stroke affected his memory - has improved.  Was found to have PAF during his hospitalization in Feb.  Was on plavix,  Has been changed to Eliquis   Past Medical History:  Diagnosis Date  . Arthritis    "pretty much all over"   . Basal cell carcinoma    "several burned off his body, face, head"  . BPH (benign prostatic hypertrophy)   . Coronary artery disease    a. s/p PCI of RCA in 2006  . CVA (cerebral infarction)    a. 06/2015: left thalamic and bilateral PCA  . GERD (gastroesophageal reflux disease)   . Hyperlipidemia   . Hypertension   . TIA (transient ischemic attack)    Approximately 6 weeks post-cardiac catheterization.   . Type II diabetes mellitus ([place])    "prediabetic; lost alot of weight; not diabetic now" (06/16/2015)    Past Surgical History:  Procedure Laterality Date  . CARDIOVASCULAR STRESS TEST  07/01/2007   EF 74%  . CATARACT EXTRACTION,  BILATERAL    . CORONARY ANGIOPLASTY WITH STENT PLACEMENT  10/2004   stenting x 2 to RCA  . FEMUR IM NAIL Right 11/26/2015   Procedure: INTRAMEDULLARY RIGHT (IM) NAIL FEMORAL;  Surgeon: Rod Can, MD;  Location: [facility];  Service: Orthopedics;  Laterality: Right;  . HERNIA REPAIR    . HIP ARTHROPLASTY  03/09/2011   Procedure: ARTHROPLASTY BIPOLAR HIP;  Surgeon: Mauri Pole;  Location: [facility];  Service: Orthopedics;  Laterality: Left;  . LAPAROSCOPIC INCISIONAL / UMBILICAL / VENTRAL HERNIA REPAIR     "below his naval"     Current Outpatient Prescriptions  Medication Sig Dispense Refill  . acetaminophen (TYLENOL) 500 MG tablet Take 1 tablet (500 mg total) by mouth every 6 (six) hours as needed for mild pain. 30 tablet 0  . apixaban (ELIQUIS) 5 MG TABS tablet Take 1 tablet (5 mg total) by mouth 2 (two) times daily. 60 tablet 1  . atorvastatin (LIPITOR) 80 MG tablet Take 1 tablet (80 mg total) by mouth daily. 90 tablet 0  . Multiple Vitamin  (MULTIVITAMIN) tablet Take 1 tablet by mouth daily.     [name] MYRBETRIQ 50 MG TB24 tablet Take 1 tablet by mouth daily.    . nitroGLYCERIN (NITROSTAT) 0.4 MG SL tablet Place 0.4 mg under the tongue every 5 (five) minutes as needed for chest pain.    . tamsulosin (FLOMAX) 0.4 MG CAPS capsule Take 0.4 mg by mouth every other day.      No current facility-administered medications for this visit.     Allergies:   Patient has no known allergies.    Social History:  The patient  reports that he quit smoking about 45 years ago. He has never used smokeless tobacco. He reports that he drinks about 4.8 oz of alcohol per week . He reports that he does not use drugs.   Family History:  The patient's family history includes Hypertension in his mother; Lung cancer in his brother and father.    ROS:  Please see the history of present illness.    Review of Systems: Constitutional:  denies fever, chills, diaphoresis, appetite change and fatigue.  HEENT: denies photophobia, eye pain, redness, hearing loss, ear pain, congestion, sore throat, rhinorrhea, sneezing, neck pain, neck stiffness and tinnitus.  Respiratory: denies SOB, DOE, cough, chest tightness, and wheezing.  Cardiovascular: denies chest pain, palpitations and leg swelling.  Gastrointestinal: denies nausea, vomiting, abdominal pain, diarrhea, constipation, blood in stool.  Genitourinary: denies dysuria, urgency, frequency, hematuria, flank pain and difficulty urinating.  Musculoskeletal: denies  myalgias, back pain, joint swelling, arthralgias and gait problem.   Skin: denies pallor, rash and wound.  Neurological: denies dizziness, seizures, syncope, weakness, light-headedness, numbness and headaches.   Hematological: denies adenopathy, easy bruising, personal or family bleeding history.  Psychiatric/ Behavioral: denies suicidal ideation, mood changes, confusion, nervousness, sleep disturbance and agitation.       All other systems are  reviewed and negative.    PHYSICAL EXAM: VS:  BP 140/86 (BP Location: Left Arm, Patient Position: Sitting, Cuff Size: Normal)   Pulse 69   Ht 5\' 7"  (1.702 m)   SpO2 99%  , BMI There is no height or weight on file to calculate BMI. GEN: Well nourished, well developed, in no acute distress  HEENT: normal  Neck: no JVD, carotid bruits, or masses Cardiac: RRR; no murmurs, rubs, or gallops,no edema  Respiratory:  clear to auscultation bilaterally, normal work of breathing GI: soft, nontender, nondistended, + BS  MS: no deformity or atrophy  Skin: warm and dry, no rash Neuro:  Strength and sensation are intact Psych: normal   EKG:  EKG is ordered today. Sinus brady at 57.   1st degree AV block  LAHB   Recent Labs: 11/07/2015: Magnesium 1.8 04/01/2016: TSH 2.423 04/05/2016: ALT 21 04/09/2016: BUN 10; Creatinine, Ser 0.90; Potassium 3.8; Sodium 131 04/11/2016: Hemoglobin 11.9 04/24/2016: Platelets 464    Lipid Panel    Component Value Date/Time   CHOL 77 03/31/2016 0353   TRIG 37 03/31/2016 0353   HDL 32 (L) 03/31/2016 0353   CHOLHDL 2.4 03/31/2016 0353   VLDL 7 03/31/2016 0353   LDLCALC 38 03/31/2016 0353      Wt Readings from Last 3 Encounters:  04/24/16 165 lb (74.8 kg)  04/13/16 165 lb 12.8 oz (75.2 kg)  03/30/16 164 lb 3.9 oz (74.5 kg)      Other studies Reviewed: Additional studies/ records that were reviewed today include: . Review of the above records demonstrates:    ASSESSMENT AND PLAN:  1. Coronary artery disease-status post PTCA and stenting of the RCA in [place], [place]  - he is doing well.  No angina.  Remains very active. He goes to the [facility] 2-3 times every week. 2. Dyslipidemia -   . Continue current dose of atorvastatin 80 mg a day.  Will check at next office visit   3. Hypertension  - his blood pressures basically  well-controlled.    4. Diabetes mellitus  - followed by his general medical doctor.  5. Hernia repair   6. Paroxysmal  atrial fib:  He was hospitalized in February with a stroke. Telemetry revealed episodes of paroxysmal atrial fibrillation in the 130s. It was thought that his strokes were due to the atrial fibrillation. He had a hemorrhagic conversion.   He was treated with aspirin for about 7 days and then was started on Eliquis . He remains on Eliquis .   Current medicines are reviewed at length with the patient today.  The patient does not have concerns regarding medicines.  The following changes have been made:  no change   Disposition:   FU with me in 1 year    Signed, Herbert Moores, MD  05/17/2016 11:18 AM    [address], [place], [place]  [address] Phone: [phone]; Fax: [phone]

## 2016-05-17 NOTE — Patient Instructions (Signed)
Medication Instructions:  Your physician recommends that you continue on your current medications as directed. Please refer to the Current Medication list given to you today.   Labwork: None Ordered   Testing/Procedures: None Ordered   Follow-Up: Your physician wants you to follow-up in: 1 year with Dr. Nahser.  You will receive a reminder letter in the mail two months in advance. If you don't receive a letter, please call our office to schedule the follow-up appointment.   If you need a refill on your cardiac medications before your next appointment, please call your pharmacy.   Thank you for choosing [facility]! Jeremiah Tarpley, RN [phone]    

## 2016-05-21 DIAGNOSIS — E1151 Type 2 diabetes mellitus with diabetic peripheral angiopathy without gangrene: Secondary | ICD-10-CM | POA: Diagnosis not present

## 2016-05-21 DIAGNOSIS — I69320 Aphasia following cerebral infarction: Secondary | ICD-10-CM | POA: Diagnosis not present

## 2016-05-21 DIAGNOSIS — I480 Paroxysmal atrial fibrillation: Secondary | ICD-10-CM | POA: Diagnosis not present

## 2016-05-21 DIAGNOSIS — I2510 Atherosclerotic heart disease of native coronary artery without angina pectoris: Secondary | ICD-10-CM | POA: Diagnosis not present

## 2016-05-21 DIAGNOSIS — I69351 Hemiplegia and hemiparesis following cerebral infarction affecting right dominant side: Secondary | ICD-10-CM | POA: Diagnosis not present

## 2016-05-21 DIAGNOSIS — I69311 Memory deficit following cerebral infarction: Secondary | ICD-10-CM | POA: Diagnosis not present

## 2016-05-24 ENCOUNTER — Telehealth: Payer: Self-pay | Admitting: Adult Health

## 2016-05-24 ENCOUNTER — Other Ambulatory Visit (INDEPENDENT_AMBULATORY_CARE_PROVIDER_SITE_OTHER): Payer: Medicare Other

## 2016-05-24 ENCOUNTER — Other Ambulatory Visit: Payer: Self-pay | Admitting: Adult Health

## 2016-05-24 DIAGNOSIS — I480 Paroxysmal atrial fibrillation: Secondary | ICD-10-CM | POA: Diagnosis not present

## 2016-05-24 DIAGNOSIS — R6883 Chills (without fever): Secondary | ICD-10-CM | POA: Diagnosis not present

## 2016-05-24 DIAGNOSIS — E1151 Type 2 diabetes mellitus with diabetic peripheral angiopathy without gangrene: Secondary | ICD-10-CM | POA: Diagnosis not present

## 2016-05-24 DIAGNOSIS — I69320 Aphasia following cerebral infarction: Secondary | ICD-10-CM | POA: Diagnosis not present

## 2016-05-24 DIAGNOSIS — I69311 Memory deficit following cerebral infarction: Secondary | ICD-10-CM | POA: Diagnosis not present

## 2016-05-24 DIAGNOSIS — R8290 Unspecified abnormal findings in urine: Secondary | ICD-10-CM | POA: Diagnosis not present

## 2016-05-24 DIAGNOSIS — I2510 Atherosclerotic heart disease of native coronary artery without angina pectoris: Secondary | ICD-10-CM | POA: Diagnosis not present

## 2016-05-24 DIAGNOSIS — I69351 Hemiplegia and hemiparesis following cerebral infarction affecting right dominant side: Secondary | ICD-10-CM | POA: Diagnosis not present

## 2016-05-24 LAB — POCT URINALYSIS DIPSTICK
Bilirubin, UA: NEGATIVE
Glucose, UA: NEGATIVE
Ketones, UA: NEGATIVE
Nitrite, UA: NEGATIVE
Protein, UA: NEGATIVE
Spec Grav, UA: 1.01 (ref 1.030–1.035)
Urobilinogen, UA: 0.2 (ref ?–2.0)
pH, UA: 7 (ref 5.0–8.0)

## 2016-05-24 MED ORDER — CEFUROXIME AXETIL 250 MG PO TABS: 250.0000 mg | ORAL_TABLET | Freq: Two times a day (BID) | ORAL | 0 refills | Status: DC

## 2016-05-24 NOTE — Progress Notes (Signed)
Wife reports that Herbert Marquez developed urinary frequency and body chills that developed last night. Wife provided urine specimen: Trace intact blood, small leuk He has significant UTI hx that quickly becomes complicated. Sent in Cefuroxime 250mg  BID x 7 D.

## 2016-05-24 NOTE — Telephone Encounter (Signed)
PT with [facility] called and thinks patient has UTI and wife wants to know if they can bring in a urine sample for testing.

## 2016-05-24 NOTE — Telephone Encounter (Signed)
Advised pt's wife to bring urine specimen by the office and we will check for UTI.  Spouse states that urologist had previously given her 2 sterile urine cups to use.  Charyl Bigger, CMA

## 2016-05-27 LAB — CULTURE, URINE COMPREHENSIVE

## 2016-05-28 ENCOUNTER — Other Ambulatory Visit: Payer: Self-pay | Admitting: Adult Health

## 2016-05-28 ENCOUNTER — Ambulatory Visit (HOSPITAL_BASED_OUTPATIENT_CLINIC_OR_DEPARTMENT_OTHER): Payer: Medicare Other | Admitting: Physical Medicine & Rehabilitation

## 2016-05-28 ENCOUNTER — Encounter: Payer: Medicare Other | Attending: Physical Medicine & Rehabilitation

## 2016-05-28 ENCOUNTER — Encounter: Payer: Self-pay | Admitting: Physical Medicine & Rehabilitation

## 2016-05-28 VITALS — BP 157/76 | HR 72 | Resp 14

## 2016-05-28 DIAGNOSIS — R269 Unspecified abnormalities of gait and mobility: Secondary | ICD-10-CM | POA: Diagnosis not present

## 2016-05-28 DIAGNOSIS — I69398 Other sequelae of cerebral infarction: Secondary | ICD-10-CM | POA: Diagnosis not present

## 2016-05-28 DIAGNOSIS — I69998 Other sequelae following unspecified cerebrovascular disease: Secondary | ICD-10-CM | POA: Insufficient documentation

## 2016-05-28 DIAGNOSIS — N3001 Acute cystitis with hematuria: Secondary | ICD-10-CM

## 2016-05-28 DIAGNOSIS — I639 Cerebral infarction, unspecified: Secondary | ICD-10-CM | POA: Diagnosis not present

## 2016-05-28 DIAGNOSIS — I69898 Other sequelae of other cerebrovascular disease: Secondary | ICD-10-CM | POA: Diagnosis present

## 2016-05-28 MED ORDER — CIPROFLOXACIN HCL 250 MG PO TABS: 250.0000 mg | ORAL_TABLET | Freq: Two times a day (BID) | ORAL | 0 refills | Status: DC

## 2016-05-28 NOTE — Progress Notes (Unsigned)
tobr

## 2016-05-28 NOTE — Progress Notes (Signed)
Subjective:    Patient ID: Herbert Marquez, male    DOB: August 15, 1927, 83 y.o.   MRN: [id number] Admit date: 04/04/2016 Discharge date: 04/14/2016 83 yo male with CAD s/p stent, Left thalamic infarct 9/17 Left temporal ICH January 2018 HPI  HHPT and OT finishing Worked with a PT in the [facility] after prior CIR admission  Pt Mod I ADL Bathing  With shower bench  Ambulates with walker 10-15 minutes at a time, supervision Wife assist patient in and out of the car. Patient does not drive   Pain Inventory Average Pain 3 Pain Right Now 4 My pain is aching  In the last 24 hours, has pain interfered with the following? General activity 3 Relation with others 3 Enjoyment of life 3 What TIME of day is your pain at its worst? evening Sleep (in general) Fair  Pain is worse with: some activites Pain improves with: rest and medication Relief from Meds: 6  Mobility walk with assistance use a walker how many minutes can you walk? 10 ability to climb steps?  no do you drive?  no use a wheelchair  Function retired  Neuro/Psych bladder control problems weakness tremor trouble walking depression  Prior Studies Any changes since last visit?  no  Physicians involved in your care Any changes since last visit?  no   Family History  Problem Relation Age of Onset  . Hypertension Mother   . Lung cancer Father   . Lung cancer Brother    Social History   Social History  . Marital status: Married    Spouse name: N/A  . Number of children: N/A  . Years of education: N/A   Occupational History  . Retired Other   Social History Main Topics  . Smoking status: Former Smoker    Quit date: 03/06/1971  . Smokeless tobacco: Never Used  . Alcohol use 4.8 oz/week    1 Cans of beer, 7 Glasses of wine per week     Comment: 1/2 BEER AND 1 WINE  . Drug use: No  . Sexual activity: No   Other Topics Concern  . None   Social History Narrative   Lives in [place], [place] with wife.  Has 3 children.    Past Surgical History:  Procedure Laterality Date  . CARDIOVASCULAR STRESS TEST  07/01/2007   EF 74%  . CATARACT EXTRACTION, BILATERAL    . CORONARY ANGIOPLASTY WITH STENT PLACEMENT  10/2004   stenting x 2 to RCA  . FEMUR IM NAIL Right 11/26/2015   Procedure: INTRAMEDULLARY RIGHT (IM) NAIL FEMORAL;  Surgeon: Rod Can, MD;  [facility];  Service: Orthopedics;  Laterality: Right;  . HERNIA REPAIR    . HIP ARTHROPLASTY  03/09/2011   Procedure: ARTHROPLASTY BIPOLAR HIP;  Surgeon: Mauri Pole;  [facility];  Service: Orthopedics;  Laterality: Left;  . LAPAROSCOPIC INCISIONAL / UMBILICAL / VENTRAL HERNIA REPAIR     "below his naval"   Past Medical History:  Diagnosis Date  . Arthritis    "pretty much all over"   . Basal cell carcinoma    "several burned off his body, face, head"  . BPH (benign prostatic hypertrophy)   . Coronary artery disease    a. s/p PCI of RCA in 2006  . CVA (cerebral infarction)    a. 06/2015: left thalamic and bilateral PCA  . GERD (gastroesophageal reflux disease)   . Hyperlipidemia   . Hypertension   . TIA (transient ischemic attack)  Approximately 6 weeks post-cardiac catheterization.   . Type II diabetes mellitus ([place])    "prediabetic; lost alot of weight; not diabetic now" (06/16/2015)   BP (!) 157/76   Pulse 72   Resp 14   SpO2 94%   Opioid Risk Score:   Fall Risk Score:  `1  Depression screen PHQ 2/9  Depression screen [facility] 2/9 03/20/2016 08/18/2015 07/21/2015  Decreased Interest 1 0 0  Down, Depressed, Hopeless 1 0 1  PHQ - 2 Score 2 0 1  Altered sleeping 3 - 0  Tired, decreased energy 0 - 2  Change in appetite 0 - 0  Feeling bad or failure about yourself  1 - 1  Trouble concentrating 0 - 0  Moving slowly or fidgety/restless 0 - 1  Suicidal thoughts 0 - 0  PHQ-9 Score 6 - 5  Difficult doing work/chores Somewhat difficult - Somewhat difficult    Review of Systems  HENT: Negative.   Eyes: Negative.     Respiratory: Negative.   Cardiovascular: Negative.   Gastrointestinal: Negative.   Endocrine: Negative.   Genitourinary: Negative.   Musculoskeletal: Positive for gait problem.  Skin: Negative.   Allergic/Immunologic: Negative.   Neurological: Positive for tremors and weakness.  Hematological: Negative.   Psychiatric/Behavioral: Positive for dysphoric mood.  All other systems reviewed and are negative.      Objective:   Physical Exam  Constitutional: He is oriented to person, place, and time. He appears well-developed and well-nourished.  HENT:  Head: Normocephalic and atraumatic.  Eyes: Conjunctivae and EOM are normal. Pupils are equal, round, and reactive to light.  Neck: Normal range of motion.  Neurological: He is alert and oriented to person, place, and time. He exhibits normal muscle tone. Coordination and gait abnormal.  Sit to stand with min assist, leans heavily toward the right side, requiring mod assist for correction Decreased finger to thumb right hand. Sensation intact to light touch bilateral upper and lower limbs Motor strength is 4/5 in the right deltoid, biceps, triceps, grip, hip flexor, knee extensor, ankle dorsiflexor.   Psychiatric: He has a normal mood and affect. Cognition and memory are impaired. He exhibits abnormal recent memory and abnormal remote memory.  Nursing note and vitals reviewed.         Assessment & Plan:  1. History of left temporal ICH January 2018 as well as left thalamic infarct September 2017. He has residual right-sided weakness, gait and balance disorder, cognitive deficits related to CVA. His cognitive deficits are chronic, he experienced some decline in his mobility after ICH, will make referral to outpatient PT for gait training. Patient and wife prefer [facility] over neuro rehabilitation so that wife can work out at the same time  Physical medicine rehabilitation. Follow-up when necessary  Neuro follow-up next month PCP  follow-up next month

## 2016-05-29 ENCOUNTER — Telehealth: Payer: Self-pay | Admitting: *Deleted

## 2016-05-29 NOTE — Telephone Encounter (Signed)
[name], PT, [facility] left a message asking for verbal orders to continue patient's home health  physical therapy.  He was scheduled to be discharged from homehealth PT, however, a UTI delayed that. She is asking for 1 week3. Verbal orders given

## 2016-05-30 ENCOUNTER — Telehealth: Payer: Self-pay | Admitting: Neurology

## 2016-05-30 ENCOUNTER — Telehealth: Payer: Self-pay

## 2016-05-30 NOTE — Telephone Encounter (Signed)
Patient has appt on 06/18/2016 with Hoyle Sauer NP for follow up.

## 2016-05-30 NOTE — Telephone Encounter (Signed)
Pt's wife said last night he dosed off to sleep while watching TV, when he woke up he tried to talk to her but it was "garbled". It lasted about 30 min. He slept well last night and is fine this morning. She has called PCP this morning and RN advised she call neurologist. Please call

## 2016-05-30 NOTE — Telephone Encounter (Signed)
Pt's spouse called stating that the patient had "an episode" last night where his words were "garbled", which cleared up after a short time.  Spouse was questioning whether or not the antibiotic for his UTI could have been the cause of this "episode".   Advised spouse that this should not have caused this problem.  Given pt's history of CVAs, advised spouse to call his neurologist for possible OV or other possible treatment/evaluation.  Spouse expressed understanding and is agreeable.  Charyl Bigger, CMA

## 2016-05-30 NOTE — Telephone Encounter (Signed)
Rn call patients wife Herbert Marquez about her husband episode last night. PTs wife stated the garbled speech last less than 30 minutes. She stated it did not last 30 minutes. Pts wife stated was fine after the episode and is doing well. Her husband was on some antibiotics. Rn advised wife that her husband has an appt 06/18/2016 with the NP. Pt was in the hospital in January 2018 for another stroke. Pt is taking all of his medications. Rn remind pts wife to monitor her husband. Rn also remind pts wife that if her husband continues to have more episodes of garble speech, or confusion, or weakness to call 911 for possible stroke. Pts wife verbalized understanding.

## 2016-05-31 ENCOUNTER — Telehealth: Payer: Self-pay

## 2016-05-31 ENCOUNTER — Other Ambulatory Visit (INDEPENDENT_AMBULATORY_CARE_PROVIDER_SITE_OTHER): Payer: Medicare Other

## 2016-05-31 DIAGNOSIS — I2510 Atherosclerotic heart disease of native coronary artery without angina pectoris: Secondary | ICD-10-CM | POA: Diagnosis not present

## 2016-05-31 DIAGNOSIS — E1151 Type 2 diabetes mellitus with diabetic peripheral angiopathy without gangrene: Secondary | ICD-10-CM | POA: Diagnosis not present

## 2016-05-31 DIAGNOSIS — Z87440 Personal history of urinary (tract) infections: Secondary | ICD-10-CM | POA: Diagnosis not present

## 2016-05-31 DIAGNOSIS — I480 Paroxysmal atrial fibrillation: Secondary | ICD-10-CM | POA: Diagnosis not present

## 2016-05-31 DIAGNOSIS — I69320 Aphasia following cerebral infarction: Secondary | ICD-10-CM | POA: Diagnosis not present

## 2016-05-31 DIAGNOSIS — I69351 Hemiplegia and hemiparesis following cerebral infarction affecting right dominant side: Secondary | ICD-10-CM | POA: Diagnosis not present

## 2016-05-31 DIAGNOSIS — I69311 Memory deficit following cerebral infarction: Secondary | ICD-10-CM | POA: Diagnosis not present

## 2016-05-31 LAB — POCT URINALYSIS DIPSTICK
Bilirubin, UA: NEGATIVE
Glucose, UA: NEGATIVE
Ketones, UA: NEGATIVE
Leukocytes, UA: NEGATIVE
Nitrite, UA: NEGATIVE
Spec Grav, UA: 1.015 (ref 1.030–1.035)
Urobilinogen, UA: 0.2 (ref ?–2.0)
pH, UA: 7.5 (ref 5.0–8.0)

## 2016-05-31 NOTE — Telephone Encounter (Signed)
Spoke with pt's wife and reminded her to please bring in a urine specimen today for follow up of UTI treatment.  Mrs. Dorfman expressed understanding and is agreeable.  Charyl Bigger, CMA

## 2016-05-31 NOTE — Progress Notes (Signed)
UA only d/t recent treatment for UTI.  Pt's spouse informed that UTI has cleared.  Charyl Bigger, CMA

## 2016-05-31 NOTE — Telephone Encounter (Signed)
-----   Message from Fonnie Mu, [place] sent at 05/28/2016 11:51 AM EDT ----- Be sure pt's spouse brings in follow up UA

## 2016-06-04 ENCOUNTER — Observation Stay (HOSPITAL_COMMUNITY): Payer: Medicare Other

## 2016-06-04 ENCOUNTER — Emergency Department (HOSPITAL_COMMUNITY): Payer: Medicare Other

## 2016-06-04 ENCOUNTER — Encounter (HOSPITAL_COMMUNITY): Payer: Self-pay

## 2016-06-04 ENCOUNTER — Inpatient Hospital Stay (HOSPITAL_COMMUNITY)
Admission: EM | Admit: 2016-06-04 | Discharge: 2016-06-10 | DRG: 064 | Disposition: A | Discharge status: Rehabilitation Facility | Payer: Medicare Other | Attending: Internal Medicine | Admitting: Internal Medicine

## 2016-06-04 DIAGNOSIS — R404 Transient alteration of awareness: Secondary | ICD-10-CM | POA: Diagnosis not present

## 2016-06-04 DIAGNOSIS — G9349 Other encephalopathy: Secondary | ICD-10-CM | POA: Diagnosis present

## 2016-06-04 DIAGNOSIS — I69351 Hemiplegia and hemiparesis following cerebral infarction affecting right dominant side: Secondary | ICD-10-CM

## 2016-06-04 DIAGNOSIS — D509 Iron deficiency anemia, unspecified: Secondary | ICD-10-CM | POA: Diagnosis present

## 2016-06-04 DIAGNOSIS — Z96642 Presence of left artificial hip joint: Secondary | ICD-10-CM | POA: Diagnosis present

## 2016-06-04 DIAGNOSIS — K219 Gastro-esophageal reflux disease without esophagitis: Secondary | ICD-10-CM | POA: Diagnosis present

## 2016-06-04 DIAGNOSIS — Z955 Presence of coronary angioplasty implant and graft: Secondary | ICD-10-CM

## 2016-06-04 DIAGNOSIS — I2510 Atherosclerotic heart disease of native coronary artery without angina pectoris: Secondary | ICD-10-CM | POA: Diagnosis not present

## 2016-06-04 DIAGNOSIS — H9190 Unspecified hearing loss, unspecified ear: Secondary | ICD-10-CM | POA: Diagnosis present

## 2016-06-04 DIAGNOSIS — R569 Unspecified convulsions: Secondary | ICD-10-CM | POA: Diagnosis present

## 2016-06-04 DIAGNOSIS — I5189 Other ill-defined heart diseases: Secondary | ICD-10-CM

## 2016-06-04 DIAGNOSIS — Z7901 Long term (current) use of anticoagulants: Secondary | ICD-10-CM

## 2016-06-04 DIAGNOSIS — R509 Fever, unspecified: Secondary | ICD-10-CM

## 2016-06-04 DIAGNOSIS — Z2989 Encounter for other specified prophylactic measures: Secondary | ICD-10-CM

## 2016-06-04 DIAGNOSIS — J449 Chronic obstructive pulmonary disease, unspecified: Secondary | ICD-10-CM | POA: Diagnosis present

## 2016-06-04 DIAGNOSIS — D62 Acute posthemorrhagic anemia: Secondary | ICD-10-CM | POA: Diagnosis not present

## 2016-06-04 DIAGNOSIS — I6930 Unspecified sequelae of cerebral infarction: Secondary | ICD-10-CM

## 2016-06-04 DIAGNOSIS — I4819 Other persistent atrial fibrillation: Secondary | ICD-10-CM | POA: Diagnosis present

## 2016-06-04 DIAGNOSIS — N182 Chronic kidney disease, stage 2 (mild): Secondary | ICD-10-CM | POA: Diagnosis present

## 2016-06-04 DIAGNOSIS — I639 Cerebral infarction, unspecified: Secondary | ICD-10-CM | POA: Diagnosis not present

## 2016-06-04 DIAGNOSIS — D508 Other iron deficiency anemias: Secondary | ICD-10-CM | POA: Diagnosis not present

## 2016-06-04 DIAGNOSIS — G9340 Encephalopathy, unspecified: Secondary | ICD-10-CM | POA: Diagnosis not present

## 2016-06-04 DIAGNOSIS — R5381 Other malaise: Secondary | ICD-10-CM | POA: Diagnosis present

## 2016-06-04 DIAGNOSIS — J90 Pleural effusion, not elsewhere classified: Secondary | ICD-10-CM | POA: Diagnosis not present

## 2016-06-04 DIAGNOSIS — D72829 Elevated white blood cell count, unspecified: Secondary | ICD-10-CM

## 2016-06-04 DIAGNOSIS — Z87891 Personal history of nicotine dependence: Secondary | ICD-10-CM

## 2016-06-04 DIAGNOSIS — Z8673 Personal history of transient ischemic attack (TIA), and cerebral infarction without residual deficits: Secondary | ICD-10-CM | POA: Diagnosis not present

## 2016-06-04 DIAGNOSIS — E871 Hypo-osmolality and hyponatremia: Secondary | ICD-10-CM | POA: Diagnosis present

## 2016-06-04 DIAGNOSIS — R4182 Altered mental status, unspecified: Secondary | ICD-10-CM | POA: Diagnosis not present

## 2016-06-04 DIAGNOSIS — N400 Enlarged prostate without lower urinary tract symptoms: Secondary | ICD-10-CM | POA: Diagnosis present

## 2016-06-04 DIAGNOSIS — R52 Pain, unspecified: Secondary | ICD-10-CM

## 2016-06-04 DIAGNOSIS — R7881 Bacteremia: Secondary | ICD-10-CM

## 2016-06-04 DIAGNOSIS — R531 Weakness: Secondary | ICD-10-CM | POA: Diagnosis not present

## 2016-06-04 DIAGNOSIS — Z85828 Personal history of other malignant neoplasm of skin: Secondary | ICD-10-CM

## 2016-06-04 DIAGNOSIS — R451 Restlessness and agitation: Secondary | ICD-10-CM | POA: Diagnosis not present

## 2016-06-04 DIAGNOSIS — I10 Essential (primary) hypertension: Secondary | ICD-10-CM | POA: Diagnosis present

## 2016-06-04 DIAGNOSIS — R410 Disorientation, unspecified: Secondary | ICD-10-CM

## 2016-06-04 DIAGNOSIS — I1310 Hypertensive heart and chronic kidney disease without heart failure, with stage 1 through stage 4 chronic kidney disease, or unspecified chronic kidney disease: Secondary | ICD-10-CM | POA: Diagnosis present

## 2016-06-04 DIAGNOSIS — I6523 Occlusion and stenosis of bilateral carotid arteries: Secondary | ICD-10-CM | POA: Diagnosis present

## 2016-06-04 DIAGNOSIS — F0390 Unspecified dementia without behavioral disturbance: Secondary | ICD-10-CM

## 2016-06-04 DIAGNOSIS — E876 Hypokalemia: Secondary | ICD-10-CM | POA: Diagnosis not present

## 2016-06-04 DIAGNOSIS — I480 Paroxysmal atrial fibrillation: Secondary | ICD-10-CM | POA: Diagnosis not present

## 2016-06-04 DIAGNOSIS — M1711 Unilateral primary osteoarthritis, right knee: Secondary | ICD-10-CM

## 2016-06-04 DIAGNOSIS — I4891 Unspecified atrial fibrillation: Secondary | ICD-10-CM

## 2016-06-04 DIAGNOSIS — Z298 Encounter for other specified prophylactic measures: Secondary | ICD-10-CM

## 2016-06-04 DIAGNOSIS — E785 Hyperlipidemia, unspecified: Secondary | ICD-10-CM | POA: Diagnosis present

## 2016-06-04 DIAGNOSIS — Z79899 Other long term (current) drug therapy: Secondary | ICD-10-CM

## 2016-06-04 DIAGNOSIS — E118 Type 2 diabetes mellitus with unspecified complications: Secondary | ICD-10-CM | POA: Diagnosis present

## 2016-06-04 DIAGNOSIS — G8191 Hemiplegia, unspecified affecting right dominant side: Secondary | ICD-10-CM

## 2016-06-04 HISTORY — DX: Encephalopathy, unspecified: G93.40

## 2016-06-04 LAB — COMPREHENSIVE METABOLIC PANEL
ALT: 17 U/L (ref 17–63)
AST: 40 U/L (ref 15–41)
Albumin: 3.6 g/dL (ref 3.5–5.0)
Alkaline Phosphatase: 77 U/L (ref 38–126)
Anion gap: 11 (ref 5–15)
BUN: 11 mg/dL (ref 6–20)
CO2: 22 mmol/L (ref 22–32)
Calcium: 9.2 mg/dL (ref 8.9–10.3)
Chloride: 101 mmol/L (ref 101–111)
Creatinine, Ser: 1.01 mg/dL (ref 0.61–1.24)
GFR calc Af Amer: >60 mL/min (ref >60)
GFR calc non Af Amer: >60 mL/min (ref >60)
Glucose, Bld: 98 mg/dL (ref 65–99)
Potassium: 3.7 mmol/L (ref 3.5–5.1)
Sodium: 134 mmol/L — ABNORMAL LOW (ref 135–145)
Total Bilirubin: 0.9 mg/dL (ref 0.3–1.2)
Total Protein: 6.8 g/dL (ref 6.5–8.1)

## 2016-06-04 LAB — CBG MONITORING, ED: Glucose-Capillary: 97 mg/dL (ref 65–99)

## 2016-06-04 LAB — URINALYSIS, ROUTINE W REFLEX MICROSCOPIC
Bilirubin Urine: NEGATIVE
Glucose, UA: NEGATIVE mg/dL
Hgb urine dipstick: NEGATIVE
Ketones, ur: 5 mg/dL — AB
Leukocytes, UA: NEGATIVE
Nitrite: NEGATIVE
Protein, ur: NEGATIVE mg/dL
Specific Gravity, Urine: 1.009 (ref 1.005–1.030)
pH: 8 (ref 5.0–8.0)

## 2016-06-04 LAB — CBC WITH DIFFERENTIAL/PLATELET
Basophils Absolute: 0 10*3/uL (ref 0.0–0.1)
Basophils Relative: 0 %
Eosinophils Absolute: 0.1 10*3/uL (ref 0.0–0.7)
Eosinophils Relative: 1 %
HCT: 37.6 % — ABNORMAL LOW (ref 39.0–52.0)
Hemoglobin: 12.6 g/dL — ABNORMAL LOW (ref 13.0–17.0)
Lymphocytes Relative: 24 %
Lymphs Abs: 2 10*3/uL (ref 0.7–4.0)
MCH: 30.1 pg (ref 26.0–34.0)
MCHC: 33.5 g/dL (ref 30.0–36.0)
MCV: 89.7 fL (ref 78.0–100.0)
Monocytes Absolute: 0.7 10*3/uL (ref 0.1–1.0)
Monocytes Relative: 8 %
Neutro Abs: 5.4 10*3/uL (ref 1.7–7.7)
Neutrophils Relative %: 67 %
Platelets: 350 10*3/uL (ref 150–400)
RBC: 4.19 MIL/uL — ABNORMAL LOW (ref 4.22–5.81)
RDW: 15 % (ref 11.5–15.5)
WBC: 8.2 10*3/uL (ref 4.0–10.5)

## 2016-06-04 LAB — I-STAT CG4 LACTIC ACID, ED: Lactic Acid, Venous: 2.6 mmol/L (ref 0.5–1.9)

## 2016-06-04 LAB — TSH: TSH: 3.437 u[IU]/mL (ref 0.350–4.500)

## 2016-06-04 LAB — MAGNESIUM: Magnesium: 1.6 mg/dL — ABNORMAL LOW (ref 1.7–2.4)

## 2016-06-04 LAB — VITAMIN B12: Vitamin B-12: 755 pg/mL (ref 180–914)

## 2016-06-04 LAB — TROPONIN I: Troponin I: <0.03 ng/mL (ref <0.03)

## 2016-06-04 MED ORDER — SODIUM CHLORIDE 0.9% FLUSH
3.0000 mL | Freq: Two times a day (BID) | INTRAVENOUS | Status: DC
  Administered 2016-06-04 – 2016-06-10 (×6): 3 mL via INTRAVENOUS

## 2016-06-04 MED ORDER — HALOPERIDOL LACTATE 5 MG/ML IJ SOLN
5.0000 mg | Freq: Once | INTRAMUSCULAR | Status: AC
  Administered 2016-06-04: 5 mg via INTRAVENOUS
  Filled 2016-06-04: qty 1

## 2016-06-04 MED ORDER — APIXABAN 5 MG PO TABS
5.0000 mg | ORAL_TABLET | Freq: Two times a day (BID) | ORAL | Status: DC
  Administered 2016-06-05 – 2016-06-10 (×11): 5 mg via ORAL
  Filled 2016-06-04 (×11): qty 1

## 2016-06-04 MED ORDER — POLYVINYL ALCOHOL 1.4 % OP SOLN
1.0000 [drp] | Freq: Every day | OPHTHALMIC | Status: DC | PRN
  Filled 2016-06-04: qty 15

## 2016-06-04 MED ORDER — SODIUM CHLORIDE 0.9% FLUSH: 3.0000 mL | INTRAVENOUS | Status: DC | PRN

## 2016-06-04 MED ORDER — ADULT MULTIVITAMIN W/MINERALS CH
1.0000 | ORAL_TABLET | Freq: Every day | ORAL | Status: DC
  Administered 2016-06-05 – 2016-06-10 (×6): 1 via ORAL
  Filled 2016-06-04 (×6): qty 1

## 2016-06-04 MED ORDER — TAMSULOSIN HCL 0.4 MG PO CAPS
0.4000 mg | ORAL_CAPSULE | ORAL | Status: DC
  Administered 2016-06-05 – 2016-06-09 (×3): 0.4 mg via ORAL
  Filled 2016-06-04 (×4): qty 1

## 2016-06-04 MED ORDER — MAGNESIUM SULFATE 2 GM/50ML IV SOLN
2.0000 g | Freq: Once | INTRAVENOUS | Status: AC
  Administered 2016-06-04: 2 g via INTRAVENOUS
  Filled 2016-06-04: qty 50

## 2016-06-04 MED ORDER — LORAZEPAM 2 MG/ML IJ SOLN
1.0000 mg | Freq: Once | INTRAMUSCULAR | Status: AC
  Administered 2016-06-04: 1 mg via INTRAVENOUS
  Filled 2016-06-04: qty 1

## 2016-06-04 MED ORDER — PANTOPRAZOLE SODIUM 40 MG PO TBEC
40.0000 mg | DELAYED_RELEASE_TABLET | Freq: Every day | ORAL | Status: DC
  Administered 2016-06-05 – 2016-06-10 (×6): 40 mg via ORAL
  Filled 2016-06-04 (×6): qty 1

## 2016-06-04 MED ORDER — SODIUM CHLORIDE 0.9 % IV SOLN: 250.0000 mL | INTRAVENOUS | Status: DC | PRN

## 2016-06-04 MED ORDER — ACETAMINOPHEN 325 MG PO TABS
650.0000 mg | ORAL_TABLET | Freq: Once | ORAL | Status: AC
  Administered 2016-06-04: 650 mg via ORAL
  Filled 2016-06-04: qty 2

## 2016-06-04 MED ORDER — ACETAMINOPHEN 650 MG RE SUPP
650.0000 mg | Freq: Four times a day (QID) | RECTAL | Status: DC | PRN
  Administered 2016-06-04: 650 mg via RECTAL

## 2016-06-04 MED ORDER — SODIUM CHLORIDE 0.9% FLUSH
3.0000 mL | Freq: Two times a day (BID) | INTRAVENOUS | Status: DC
  Administered 2016-06-05 – 2016-06-10 (×8): 3 mL via INTRAVENOUS

## 2016-06-04 MED ORDER — ONDANSETRON HCL 4 MG/2ML IJ SOLN
4.0000 mg | Freq: Four times a day (QID) | INTRAMUSCULAR | Status: DC | PRN
Start: 2016-06-04 — End: 2016-06-10

## 2016-06-04 MED ORDER — ATORVASTATIN CALCIUM 80 MG PO TABS
80.0000 mg | ORAL_TABLET | Freq: Every day | ORAL | Status: DC
Start: 2016-06-05 — End: 2016-06-10
  Administered 2016-06-05 – 2016-06-09 (×5): 80 mg via ORAL
  Filled 2016-06-04 (×5): qty 1

## 2016-06-04 MED ORDER — SODIUM CHLORIDE 0.9 % IV SOLN
1000.0000 mL | INTRAVENOUS | Status: DC
  Administered 2016-06-04: 1000 mL via INTRAVENOUS

## 2016-06-04 MED ORDER — ONDANSETRON HCL 4 MG PO TABS: 4.0000 mg | ORAL_TABLET | Freq: Four times a day (QID) | ORAL | Status: DC | PRN

## 2016-06-04 MED ORDER — ACETAMINOPHEN 325 MG PO TABS
650.0000 mg | ORAL_TABLET | Freq: Four times a day (QID) | ORAL | Status: DC | PRN
  Administered 2016-06-05: 650 mg via ORAL
  Filled 2016-06-04: qty 2

## 2016-06-04 NOTE — ED Triage Notes (Signed)
Pt brought in by EMS due to having a change in mental status while at urologist office. Per EMS pt became agitated and had some generalized weakness. Pt was recently diagnosed with a TIA and has right sided drift and weakness. Pt a&ox1.

## 2016-06-04 NOTE — ED Notes (Signed)
Patient transported to XRAY 

## 2016-06-04 NOTE — ED Notes (Signed)
Unable to give magnesium as it has not arrived to ED from pharmacy. 2W RN notified. Message sent to pharmacy.
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
## 2016-06-04 NOTE — ED Notes (Signed)
Updated on family on delay for bed assignment.

## 2016-06-04 NOTE — ED Notes (Signed)
Dr. Dayna Barker notified that pt could not be still for CT scan or x-ray.

## 2016-06-04 NOTE — ED Provider Notes (Signed)
[facility] DEPT Provider Note   CSN: [id number] Arrival date & time: 06/04/16  1509     History   Chief Complaint Chief Complaint  Patient presents with  . Altered Mental Status    HPI Herbert CASHER is a 83 y.o. male.   Altered Mental Status   This is a new problem. The problem has not changed since onset.Associated symptoms include confusion, weakness and agitation.   83 year old male with past mental history of arthritis coronary artery disease, stroke 2, diabetes, hyperlipidemia, hypertension, paroxysmal atrial fibrillation and multiple UTIs that was his normal state of health until he went to the urologist office where he had what sounds like Reiger's. This was followed by an episode where "his right face drawing up" which is reportedly similar to previous TIA. He does have a history of right sided deficits with a stroke. This episode lasted 2 or 3 minutes and then improved be still agitated and not acting like himself at this time. Patient denies cough, fever, recent illnesses or sick contacts. Is in no pain at this time, aside from chronic knee pain, but just can't get comfortable. No modifying or associated symptoms.  Past Medical History:  Diagnosis Date  . Arthritis    "pretty much all over"   . Basal cell carcinoma    "several burned off his body, face, head"  . BPH (benign prostatic hypertrophy)   . Coronary artery disease    a. s/p PCI of RCA in 2006  . CVA (cerebral infarction)    a. 06/2015: left thalamic and bilateral PCA  . GERD (gastroesophageal reflux disease)   . Hyperlipidemia   . Hypertension   . TIA (transient ischemic attack)    Approximately 6 weeks post-cardiac catheterization.   . Type II diabetes mellitus ([place])    "prediabetic; lost alot of weight; not diabetic now" (06/16/2015)    Patient Active Problem List   Diagnosis Date Noted  . Acute encephalopathy 06/04/2016  . PAF (paroxysmal atrial fibrillation) ([place])   . Chronic pain of left  knee   . Iron deficiency anemia 04/04/2016  . History of CVA (cerebrovascular accident) 04/04/2016  . Chronic pain of right knee   . Cerebral hemorrhage (HCC) w/ SDH s/p IV tPA   . Coronary artery disease involving native coronary artery of native heart without angina pectoris   . History of right hip replacement   . Acute ischemic stroke ([place]) - L temporal lobe s/p tPA 03/30/2016  . BPH (benign prostatic hyperplasia) 11/25/2015  . GERD (gastroesophageal reflux disease) 11/25/2015  . CKD (chronic kidney disease), stage II 11/25/2015  . Urinary frequency   . Overactive bladder   . Cerebrovascular accident (CVA) due to bilateral occlusion of posterior cerebral arteries ([place]) 09/18/2015  . Gait disturbance, post-stroke 06/23/2015  . 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 Thalamic infarction ([place]) 06/21/2015  . Benign essential HTN   . Type 2 diabetes mellitus with complication, without long-term current use of insulin ([place])   . Dysphagia as late effect of cerebrovascular disease   . Hyponatremia   . HLD (hyperlipidemia)   . Obesity 03/09/2011  . COPD (chronic obstructive pulmonary disease) ([place]) 03/09/2011    Past Surgical History:  Procedure Laterality Date  . CARDIOVASCULAR STRESS TEST  07/01/2007   EF 74%  . CATARACT EXTRACTION, BILATERAL    . CORONARY ANGIOPLASTY WITH STENT PLACEMENT  10/2004   stenting x 2 to RCA  . FEMUR IM NAIL Right 11/26/2015   Procedure: INTRAMEDULLARY  RIGHT (IM) NAIL FEMORAL;  Surgeon: Rod Can, MD;  Location: [facility] ORS;  Service: Orthopedics;  Laterality: Right;  . HERNIA REPAIR    . HIP ARTHROPLASTY  03/09/2011   Procedure: ARTHROPLASTY BIPOLAR HIP;  Surgeon: Mauri Pole;  Location: [facility];  Service: Orthopedics;  Laterality: Left;  . LAPAROSCOPIC INCISIONAL / UMBILICAL / VENTRAL HERNIA REPAIR     "below his naval"       Home Medications    Prior to Admission medications   Medication Sig Start Date End Date Taking? Authorizing Provider    acetaminophen (TYLENOL) 500 MG tablet Take 1 tablet (500 mg total) by mouth every 6 (six) hours as needed for mild pain. Patient taking differently: Take 1,000 mg by mouth every 6 (six) hours as needed for mild pain.  04/13/16  Yes Ivan Anchors Love, PA-C  apixaban (ELIQUIS) 5 MG TABS tablet Take 1 tablet (5 mg total) by mouth 2 (two) times daily. 04/13/16  Yes Ivan Anchors Love, PA-C  atorvastatin (LIPITOR) 80 MG tablet Take 1 tablet (80 mg total) by mouth daily. Patient taking differently: Take 80 mg by mouth at bedtime.  05/11/16  Yes Odella Aquas, NP  Menthol, Topical Analgesic, (BLUE-EMU MAXIMUM STRENGTH EX) Apply 1 application topically at bedtime. Knee pain   Yes Historical Provider, MD  Multiple Vitamin (MULTIVITAMIN WITH MINERALS) TABS tablet Take 1 tablet by mouth daily.   Yes Historical Provider, MD  omeprazole (PRILOSEC) 20 MG capsule Take 20 mg by mouth daily.   Yes Historical Provider, MD  polyvinyl alcohol (ARTIFICIAL TEARS) 1.4 % ophthalmic solution Place 1 drop into both eyes daily as needed for dry eyes.   Yes Historical Provider, MD  tamsulosin (FLOMAX) 0.4 MG CAPS capsule Take 0.4 mg by mouth every other day.    Yes Historical Provider, MD  citalopram (CELEXA) 10 MG tablet Take 10 mg by mouth daily.    Historical Provider, MD  nitroGLYCERIN (NITROSTAT) 0.4 MG SL tablet Place 0.4 mg under the tongue every 5 (five) minutes as needed for chest pain.    Historical Provider, MD    Family History Family History  Problem Relation Age of Onset  . Hypertension Mother   . Lung cancer Father   . Lung cancer Brother     Social History Social History  Substance Use Topics  . Smoking status: Former Smoker    Quit date: 03/06/1971  . Smokeless tobacco: Never Used  . Alcohol use 4.8 oz/week    1 Cans of beer, 7 Glasses of wine per week     Comment: 1/2 BEER AND 1 WINE     Allergies   Patient has no known allergies.   Review of Systems Review of Systems  Neurological: Positive for  weakness.  Psychiatric/Behavioral: Positive for agitation and confusion.  All other systems reviewed and are negative.    Physical Exam Updated Vital Signs BP 126/72   Pulse 91   Temp 97.4 F (36.3 C) (Oral)   Resp 20   Ht 5\' 7"  (1.702 m)   Wt 170 lb (77.1 kg)   SpO2 99%   BMI 26.63 kg/m   Physical Exam  Constitutional: He appears well-developed and well-nourished. He appears distressed (agitated).  HENT:  Head: Normocephalic and atraumatic.  Eyes: Conjunctivae and EOM are normal. Right eye exhibits no discharge. Left eye exhibits no discharge.  Neck: Normal range of motion.  Cardiovascular: Normal rate.   Pulmonary/Chest: Effort normal. No respiratory distress. He has no wheezes.  Abdominal:  Soft. He exhibits no distension. There is no tenderness.  Musculoskeletal: Normal range of motion.  Neurological: He is alert.  Moving all extremities Alert Oriented to person, place, time Moving all around in bed likely 2/2 discomfort, difficult to assess reflexes CN's intact aside from possibly a small left facial droop Pupils pinpoint bilaterally  Skin: Skin is warm and dry.  Nursing note and vitals reviewed.    ED Treatments / Results  Labs (all labs ordered are listed, but only abnormal results are displayed) Labs Reviewed  COMPREHENSIVE METABOLIC PANEL - Abnormal; Notable for the following:       Result Value   Sodium 134 (*)    All other components within normal limits  CBC WITH DIFFERENTIAL/PLATELET - Abnormal; Notable for the following:    RBC 4.19 (*)    Hemoglobin 12.6 (*)    HCT 37.6 (*)    All other components within normal limits  URINALYSIS, ROUTINE W REFLEX MICROSCOPIC - Abnormal; Notable for the following:    Ketones, ur 5 (*)    All other components within normal limits  MAGNESIUM - Abnormal; Notable for the following:    Magnesium 1.6 (*)    All other components within normal limits  I-STAT CG4 LACTIC ACID, ED - Abnormal; Notable for the following:      Lactic Acid, Venous 2.60 (*)    All other components within normal limits  CULTURE, BLOOD (ROUTINE X 2)  CULTURE, BLOOD (ROUTINE X 2)  TROPONIN I  CBC WITH DIFFERENTIAL/PLATELET  BASIC METABOLIC PANEL  TSH  RPR  VITAMIN B12  FOLATE RBC  HIV ANTIBODY (ROUTINE TESTING)  CBG MONITORING, ED    EKG  EKG Interpretation  Date/Time:  Monday June 04 2016 16:13:30 EDT Ventricular Rate:  77 PR Interval:    QRS Duration: 122 QT Interval:  427 QTC Calculation: 484 R Axis:   -62 Text Interpretation:  Age not entered, assumed to be  83 years old for purpose of ECG interpretation Sinus rhythm vs afib Borderline prolonged PR interval Nonspecific IVCD with LAD nonspecific ST changes not new Confirmed by [facility] MD, Redell Bhandari [phone]) on 06/04/2016 4:28:07 PM       Radiology Dg Chest 2 View  Result Date: 06/04/2016 CLINICAL DATA:  Evaluate for pneumonia EXAM: CHEST  2 VIEW COMPARISON:  March 09, 2011 FINDINGS: The heart size is enlarged. The mid aorta is tortuous. There is increased pulmonary interstitium bilaterally. There is minimal right pleural effusion. There is no focal pneumonia. The osseous structures are stable. IMPRESSION: Mild interstitial edema.  Small right pleural effusion. Electronically Signed   By: Abelardo Diesel M.D.   On: 06/04/2016 19:15   Ct Head Wo Contrast  Result Date: 06/04/2016 CLINICAL DATA:  Altered mental status EXAM: CT HEAD WITHOUT CONTRAST TECHNIQUE: Contiguous axial images were obtained from the base of the skull through the vertex without intravenous contrast. COMPARISON:  04/12/2016, 03/31/2016, 03/30/2016 FINDINGS: Brain: Examination is significantly degraded by gross patient motion, as well as positioning. No gross hemorrhage or mass is visualized. Periventricular white matter small vessel ischemic changes. Ventricle size grossly stable. Vascular: Carotid artery calcifications Skull: Limited due to motion Sinuses/Orbits: No acute abnormality Other: None IMPRESSION:  Significantly limited exam secondary to patient motion and positioning. No gross acute abnormality is visualized Electronically Signed   By: Donavan Foil M.D.   On: 06/04/2016 17:23   Mr Brain Wo Contrast  Result Date: 06/04/2016 CLINICAL DATA:  Acute encephalopathy EXAM: MRI HEAD WITHOUT CONTRAST TECHNIQUE: Multiplanar, multiecho  pulse sequences of the brain and surrounding structures were obtained without intravenous contrast. COMPARISON:  Head CT 06/04/2016 FINDINGS: The examination had to be discontinued prior to completion due to patient agitation, fighting to get up scanner. Only axial diffusion weighted imaging was obtained and that is severely motion degraded. An axial ADC map was constructed from this data. The diffusion-weighted imaging shows no large area of diffusion restriction. There is no midline shift. IMPRESSION: Severely limited examination without evidence of large acute infarct. Electronically Signed   By: Ulyses Jarred M.D.   On: 06/04/2016 21:43    Procedures Procedures (including critical care time)  Medications Ordered in ED Medications  multivitamin with minerals tablet 1 tablet (not administered)  pantoprazole (PROTONIX) EC tablet 40 mg (not administered)  polyvinyl alcohol (LIQUIFILM TEARS) 1.4 % ophthalmic solution 1 drop (not administered)  atorvastatin (LIPITOR) tablet 80 mg (not administered)  apixaban (ELIQUIS) tablet 5 mg (not administered)  tamsulosin (FLOMAX) capsule 0.4 mg (not administered)  sodium chloride flush (NS) 0.9 % injection 3 mL (not administered)  sodium chloride flush (NS) 0.9 % injection 3 mL (not administered)  0.9 %  sodium chloride infusion (not administered)  sodium chloride flush (NS) 0.9 % injection 3 mL (not administered)  acetaminophen (TYLENOL) tablet 650 mg (not administered)    Or  acetaminophen (TYLENOL) suppository 650 mg (not administered)  ondansetron (ZOFRAN) tablet 4 mg (not administered)    Or  ondansetron (ZOFRAN)  injection 4 mg (not administered)  magnesium sulfate IVPB 2 g 50 mL (not administered)  acetaminophen (TYLENOL) tablet 650 mg (650 mg Oral Given 06/04/16 1620)  LORazepam (ATIVAN) injection 1 mg (1 mg Intravenous Given 06/04/16 1622)  LORazepam (ATIVAN) injection 1 mg (1 mg Intravenous Given 06/04/16 1714)  haloperidol lactate (HALDOL) injection 5 mg (5 mg Intravenous Given 06/04/16 1812)  LORazepam (ATIVAN) injection 1 mg (1 mg Intravenous Given 06/04/16 1817)     Initial Impression / Assessment and Plan / ED Course  I have reviewed the triage vital signs and the nursing notes.  Pertinent labs & imaging results that were available during my care of the patient were reviewed by me and considered in my medical decision making (see chart for details).     Patient progressively increasing agitation while in the emergency department required 3 doses of Ativan and a dose of Haldol to keep him Down. Workup overall unremarkable. Unsure if this is postictal versus new stroke versus some other cause of encephalopathy will admit to medicine for observation and likely sedated MRI.  Final Clinical Impressions(s) / ED Diagnoses   Final diagnoses:  Altered mental status, unspecified altered mental status type      Merrily Pew, MD 06/04/16 2202

## 2016-06-04 NOTE — ED Notes (Signed)
Pt agitated and pulling off leads. New orders obtained.

## 2016-06-04 NOTE — ED Notes (Signed)
Admitting MD at bedside.

## 2016-06-05 ENCOUNTER — Observation Stay (HOSPITAL_COMMUNITY): Payer: Medicare Other

## 2016-06-05 DIAGNOSIS — I639 Cerebral infarction, unspecified: Secondary | ICD-10-CM | POA: Diagnosis not present

## 2016-06-05 DIAGNOSIS — H9190 Unspecified hearing loss, unspecified ear: Secondary | ICD-10-CM | POA: Diagnosis present

## 2016-06-05 DIAGNOSIS — R410 Disorientation, unspecified: Secondary | ICD-10-CM | POA: Diagnosis not present

## 2016-06-05 DIAGNOSIS — Z85828 Personal history of other malignant neoplasm of skin: Secondary | ICD-10-CM | POA: Diagnosis not present

## 2016-06-05 DIAGNOSIS — Z8673 Personal history of transient ischemic attack (TIA), and cerebral infarction without residual deficits: Secondary | ICD-10-CM | POA: Diagnosis not present

## 2016-06-05 DIAGNOSIS — D72829 Elevated white blood cell count, unspecified: Secondary | ICD-10-CM | POA: Diagnosis not present

## 2016-06-05 DIAGNOSIS — R451 Restlessness and agitation: Secondary | ICD-10-CM | POA: Diagnosis not present

## 2016-06-05 DIAGNOSIS — G8191 Hemiplegia, unspecified affecting right dominant side: Secondary | ICD-10-CM | POA: Diagnosis not present

## 2016-06-05 DIAGNOSIS — N400 Enlarged prostate without lower urinary tract symptoms: Secondary | ICD-10-CM | POA: Diagnosis not present

## 2016-06-05 DIAGNOSIS — I2510 Atherosclerotic heart disease of native coronary artery without angina pectoris: Secondary | ICD-10-CM | POA: Diagnosis not present

## 2016-06-05 DIAGNOSIS — G9340 Encephalopathy, unspecified: Secondary | ICD-10-CM

## 2016-06-05 DIAGNOSIS — M25551 Pain in right hip: Secondary | ICD-10-CM | POA: Diagnosis not present

## 2016-06-05 DIAGNOSIS — I6789 Other cerebrovascular disease: Secondary | ICD-10-CM | POA: Diagnosis not present

## 2016-06-05 DIAGNOSIS — I6521 Occlusion and stenosis of right carotid artery: Secondary | ICD-10-CM | POA: Diagnosis not present

## 2016-06-05 DIAGNOSIS — Z87891 Personal history of nicotine dependence: Secondary | ICD-10-CM | POA: Diagnosis not present

## 2016-06-05 DIAGNOSIS — E871 Hypo-osmolality and hyponatremia: Secondary | ICD-10-CM | POA: Diagnosis not present

## 2016-06-05 DIAGNOSIS — I480 Paroxysmal atrial fibrillation: Secondary | ICD-10-CM | POA: Diagnosis not present

## 2016-06-05 DIAGNOSIS — Z96642 Presence of left artificial hip joint: Secondary | ICD-10-CM | POA: Diagnosis present

## 2016-06-05 DIAGNOSIS — I69351 Hemiplegia and hemiparesis following cerebral infarction affecting right dominant side: Secondary | ICD-10-CM | POA: Diagnosis not present

## 2016-06-05 DIAGNOSIS — N3942 Incontinence without sensory awareness: Secondary | ICD-10-CM | POA: Diagnosis not present

## 2016-06-05 DIAGNOSIS — I6523 Occlusion and stenosis of bilateral carotid arteries: Secondary | ICD-10-CM | POA: Diagnosis present

## 2016-06-05 DIAGNOSIS — I4891 Unspecified atrial fibrillation: Secondary | ICD-10-CM | POA: Diagnosis not present

## 2016-06-05 DIAGNOSIS — I6930 Unspecified sequelae of cerebral infarction: Secondary | ICD-10-CM | POA: Diagnosis not present

## 2016-06-05 DIAGNOSIS — Z79899 Other long term (current) drug therapy: Secondary | ICD-10-CM | POA: Diagnosis not present

## 2016-06-05 DIAGNOSIS — I63432 Cerebral infarction due to embolism of left posterior cerebral artery: Secondary | ICD-10-CM | POA: Diagnosis not present

## 2016-06-05 DIAGNOSIS — Z298 Encounter for other specified prophylactic measures: Secondary | ICD-10-CM | POA: Diagnosis not present

## 2016-06-05 DIAGNOSIS — I69393 Ataxia following cerebral infarction: Secondary | ICD-10-CM | POA: Diagnosis not present

## 2016-06-05 DIAGNOSIS — I6310 Cerebral infarction due to embolism of unspecified precerebral artery: Secondary | ICD-10-CM | POA: Diagnosis not present

## 2016-06-05 DIAGNOSIS — R1310 Dysphagia, unspecified: Secondary | ICD-10-CM | POA: Diagnosis not present

## 2016-06-05 DIAGNOSIS — E876 Hypokalemia: Secondary | ICD-10-CM | POA: Diagnosis not present

## 2016-06-05 DIAGNOSIS — I6349 Cerebral infarction due to embolism of other cerebral artery: Secondary | ICD-10-CM | POA: Diagnosis not present

## 2016-06-05 DIAGNOSIS — F0390 Unspecified dementia without behavioral disturbance: Secondary | ICD-10-CM | POA: Diagnosis not present

## 2016-06-05 DIAGNOSIS — G8190 Hemiplegia, unspecified affecting unspecified side: Secondary | ICD-10-CM | POA: Diagnosis not present

## 2016-06-05 DIAGNOSIS — I481 Persistent atrial fibrillation: Secondary | ICD-10-CM | POA: Diagnosis not present

## 2016-06-05 DIAGNOSIS — R569 Unspecified convulsions: Secondary | ICD-10-CM | POA: Diagnosis not present

## 2016-06-05 DIAGNOSIS — D62 Acute posthemorrhagic anemia: Secondary | ICD-10-CM | POA: Diagnosis not present

## 2016-06-05 DIAGNOSIS — R7881 Bacteremia: Secondary | ICD-10-CM | POA: Diagnosis not present

## 2016-06-05 DIAGNOSIS — D508 Other iron deficiency anemias: Secondary | ICD-10-CM | POA: Diagnosis not present

## 2016-06-05 DIAGNOSIS — R269 Unspecified abnormalities of gait and mobility: Secondary | ICD-10-CM | POA: Diagnosis not present

## 2016-06-05 DIAGNOSIS — E118 Type 2 diabetes mellitus with unspecified complications: Secondary | ICD-10-CM | POA: Diagnosis not present

## 2016-06-05 DIAGNOSIS — Z955 Presence of coronary angioplasty implant and graft: Secondary | ICD-10-CM | POA: Diagnosis not present

## 2016-06-05 DIAGNOSIS — R52 Pain, unspecified: Secondary | ICD-10-CM | POA: Diagnosis not present

## 2016-06-05 DIAGNOSIS — Z7901 Long term (current) use of anticoagulants: Secondary | ICD-10-CM | POA: Diagnosis not present

## 2016-06-05 DIAGNOSIS — I69391 Dysphagia following cerebral infarction: Secondary | ICD-10-CM | POA: Diagnosis present

## 2016-06-05 DIAGNOSIS — I10 Essential (primary) hypertension: Secondary | ICD-10-CM | POA: Diagnosis not present

## 2016-06-05 DIAGNOSIS — M79651 Pain in right thigh: Secondary | ICD-10-CM | POA: Diagnosis not present

## 2016-06-05 DIAGNOSIS — I69993 Ataxia following unspecified cerebrovascular disease: Secondary | ICD-10-CM | POA: Diagnosis not present

## 2016-06-05 DIAGNOSIS — N182 Chronic kidney disease, stage 2 (mild): Secondary | ICD-10-CM | POA: Diagnosis present

## 2016-06-05 DIAGNOSIS — K219 Gastro-esophageal reflux disease without esophagitis: Secondary | ICD-10-CM | POA: Diagnosis not present

## 2016-06-05 DIAGNOSIS — R5381 Other malaise: Secondary | ICD-10-CM | POA: Diagnosis present

## 2016-06-05 DIAGNOSIS — G9349 Other encephalopathy: Secondary | ICD-10-CM | POA: Diagnosis not present

## 2016-06-05 DIAGNOSIS — I69398 Other sequelae of cerebral infarction: Secondary | ICD-10-CM | POA: Diagnosis not present

## 2016-06-05 DIAGNOSIS — E785 Hyperlipidemia, unspecified: Secondary | ICD-10-CM | POA: Diagnosis not present

## 2016-06-05 DIAGNOSIS — R4182 Altered mental status, unspecified: Secondary | ICD-10-CM | POA: Diagnosis not present

## 2016-06-05 DIAGNOSIS — I63431 Cerebral infarction due to embolism of right posterior cerebral artery: Secondary | ICD-10-CM | POA: Diagnosis not present

## 2016-06-05 DIAGNOSIS — M1711 Unilateral primary osteoarthritis, right knee: Secondary | ICD-10-CM | POA: Diagnosis not present

## 2016-06-05 DIAGNOSIS — I1310 Hypertensive heart and chronic kidney disease without heart failure, with stage 1 through stage 4 chronic kidney disease, or unspecified chronic kidney disease: Secondary | ICD-10-CM | POA: Diagnosis present

## 2016-06-05 LAB — RESPIRATORY PANEL BY PCR

## 2016-06-05 LAB — BLOOD CULTURE ID PANEL (REFLEXED)

## 2016-06-05 LAB — CBC WITH DIFFERENTIAL/PLATELET
Basophils Absolute: 0 10*3/uL (ref 0.0–0.1)
Basophils Relative: 0 %
Eosinophils Absolute: 0 10*3/uL (ref 0.0–0.7)
Eosinophils Relative: 0 %
HCT: 34.3 % — ABNORMAL LOW (ref 39.0–52.0)
Hemoglobin: 11.5 g/dL — ABNORMAL LOW (ref 13.0–17.0)
Lymphocytes Relative: 14 %
Lymphs Abs: 1.6 10*3/uL (ref 0.7–4.0)
MCH: 30 pg (ref 26.0–34.0)
MCHC: 33.5 g/dL (ref 30.0–36.0)
MCV: 89.6 fL (ref 78.0–100.0)
Monocytes Absolute: 0.9 10*3/uL (ref 0.1–1.0)
Monocytes Relative: 8 %
Neutro Abs: 8.6 10*3/uL — ABNORMAL HIGH (ref 1.7–7.7)
Neutrophils Relative %: 78 %
Platelets: 320 10*3/uL (ref 150–400)
RBC: 3.83 MIL/uL — ABNORMAL LOW (ref 4.22–5.81)
RDW: 15 % (ref 11.5–15.5)
WBC: 11.2 10*3/uL — ABNORMAL HIGH (ref 4.0–10.5)

## 2016-06-05 LAB — BASIC METABOLIC PANEL
Anion gap: 9 (ref 5–15)
BUN: 10 mg/dL (ref 6–20)
CO2: 21 mmol/L — ABNORMAL LOW (ref 22–32)
Calcium: 8.7 mg/dL — ABNORMAL LOW (ref 8.9–10.3)
Chloride: 103 mmol/L (ref 101–111)
Creatinine, Ser: 0.93 mg/dL (ref 0.61–1.24)
GFR calc Af Amer: >60 mL/min (ref >60)
GFR calc non Af Amer: >60 mL/min (ref >60)
Glucose, Bld: 106 mg/dL — ABNORMAL HIGH (ref 65–99)
Potassium: 3.7 mmol/L (ref 3.5–5.1)
Sodium: 133 mmol/L — ABNORMAL LOW (ref 135–145)

## 2016-06-05 LAB — VITAMIN B12: Vitamin B-12: 736 pg/mL (ref 180–914)

## 2016-06-05 LAB — AMMONIA: Ammonia: 37 umol/L — ABNORMAL HIGH (ref 9–35)

## 2016-06-05 LAB — HIV ANTIBODY (ROUTINE TESTING W REFLEX): HIV Screen 4th Generation wRfx: NONREACTIVE

## 2016-06-05 LAB — LACTIC ACID, PLASMA
Lactic Acid, Venous: 0.9 mmol/L (ref 0.5–1.9)
Lactic Acid, Venous: 0.7 mmol/L (ref 0.5–1.9)

## 2016-06-05 LAB — INFLUENZA PANEL BY PCR (TYPE A & B)
Influenza A By PCR: NEGATIVE
Influenza B By PCR: NEGATIVE

## 2016-06-05 MED ORDER — SODIUM CHLORIDE 0.9 % IV SOLN
1500.0000 mg | INTRAVENOUS | Status: DC
  Administered 2016-06-05 – 2016-06-06 (×2): 1500 mg via INTRAVENOUS
  Filled 2016-06-05 (×4): qty 1500

## 2016-06-05 MED ORDER — OXYCODONE HCL 5 MG PO TABS
5.0000 mg | ORAL_TABLET | Freq: Four times a day (QID) | ORAL | Status: DC | PRN
  Administered 2016-06-05: 5 mg via ORAL
  Filled 2016-06-05: qty 1

## 2016-06-05 MED ORDER — MORPHINE SULFATE (PF) 2 MG/ML IV SOLN
1.0000 mg | Freq: Once | INTRAVENOUS | Status: AC
  Administered 2016-06-05: 1 mg via INTRAVENOUS
  Filled 2016-06-05: qty 1

## 2016-06-05 NOTE — Procedures (Signed)
ELECTROENCEPHALOGRAM REPORT  Date of Study: 06/05/2016  Patient's Name: Herbert Marquez MRN: [id number] Date of Birth: [birth year]  Referring Provider: Dr. Christia Reading Opyd  Clinical History: This is an 83 year old man with altered mental status  Medications: acetaminophen (TYLENOL) tablet 650 mg  apixaban (ELIQUIS) tablet 5 mg  atorvastatin (LIPITOR) tablet 80 mg  multivitamin with minerals tablet 1 tablet  pantoprazole (PROTONIX) EC tablet 40 mg  tamsulosin (FLOMAX) capsule 0.4 mg   Technical Summary: A multichannel digital EEG recording measured by the international 10-20 system with electrodes applied with paste and impedances below 5000 ohms performed as portable with EKG monitoring in an awake and restless/confused patient.  Hyperventilation and photic stimulation were not performed.  The digital EEG was referentially recorded, reformatted, and digitally filtered in a variety of bipolar and referential montages for optimal display.   Description: The patient is awake and restless/confused during the recording.  During maximal wakefulness, there is a symmetric, medium voltage 6 Hz posterior dominant rhythm that attenuates with eye opening. This is admixed with a moderate amount of diffuse 4-5 Hz theta and 2-3 Hz delta slowing of the waking background.  Sleep is not captured. Hyperventilation and photic stimulation were not performed.  There were no epileptiform discharges or electrographic seizures seen.    EKG lead was unremarkable.  Impression: This awake EEG is abnormal due to moderate diffuse slowing of the waking background.  Clinical Correlation of the above findings indicates diffuse cerebral dysfunction that is non-specific in etiology and can be seen with hypoxic/ischemic injury, toxic/metabolic encephalopathies, neurodegenerative disorders, or medication effect.  The absence of epileptiform discharges does not rule out a clinical diagnosis of epilepsy.  Clinical correlation is  advised.   Ellouise Newer, M.D.

## 2016-06-05 NOTE — Progress Notes (Addendum)
PROGRESS NOTE    BUSTER SCHUELLER  NHA:[id number] DOB: 04/26/1927 DOA: 06/04/2016 PCP: Lillard Anes, NP   Brief Narrative: 83 y.o. male with medical history significant for coronary artery disease with stent, history of CVA, GERD, and BPH who presents from his urologist's office for evaluation of acute change in mental status. Patient has history of BPH and UTIs and was in the urology clinic for evaluation  when he became acutely altered, noted to be agitated. His wife witnessed the episode and reports that it began with "drawing up of the right face" briefly, and that was followed by the agitation and generalized weakness.  Assessment & Plan:   #  Acute encephalopathy of unknown etiology: -Patient was calm comfortable this morning. He was alert awake.  -Head CT and MRI with no acute finding however limited by motion -EEG with no epileptic foci -TSH acceptable. Check ammonia and b12  Level. -We'll order PT OT evaluation and supportive care.  # Acute febrile illness: Chest x-ray with no pneumonia. UA unremarkable. -Follow-up influenza and respiratory viral panel. Currently on droplet precaution. -Blood cultures were sent in on admission, follow up the results. -Tylenol as needed.  #History of stroke: No acute finding., PT OT evaluation  #Right leg pain: Patient was complaining of pain mostly around right knee and thigh. X-ray of right leg pain which was consistent with degenerative changes with no acute finding. Pain medications ordered. Continue therapies  #Paroxysmal atrial fibrillation: on eliquis, monitor heart rate.  #History of coronary artery disease  Hyponatremia: Monitor BMP  DVT prophylaxis: Systemic anticoagulation Code Status: Full code Family Communication: Discussed with the patient's wife and daughter at bedside Disposition Plan: Likely discharge home with home care in 1-2 days    Consultants:   None  Procedures: EEG, CT MRI head Antimicrobials:  None  Subjective: Patient was seen and examined at bedside. He was alert awake, and comfortable. Unable to obtain review of system  Objective: Vitals:   06/04/16 2205 06/04/16 2205 06/04/16 2315 06/05/16 0439  BP:  (!) 155/98 140/73 112/75  Pulse:  94 81 82  Resp:  (!) 24 20 18   Temp:  (!) 100.6 F (38.1 C) 97.9 F (36.6 C) 97.8 F (36.6 C)  TempSrc:  Rectal Axillary Axillary  SpO2:  100% 98% 100%  Weight: 74.7 kg (164 lb 11.2 oz)   74.6 kg (164 lb 6.4 oz)  Height:        Intake/Output Summary (Last 24 hours) at 06/05/16 1330 Last data filed at 06/05/16 1004  Gross per 24 hour  Intake             1006 ml  Output              400 ml  Net              606 ml   Filed Weights   06/04/16 1517 06/04/16 2205 06/05/16 0439  Weight: 77.1 kg (170 lb) 74.7 kg (164 lb 11.2 oz) 74.6 kg (164 lb 6.4 oz)    Examination:  General exam: Appears calm and comfortable  Respiratory system: Clear to auscultation. Respiratory effort normal. No wheezing or crackle Cardiovascular system: S1 & S2 heard, RRR.  No pedal edema. Gastrointestinal system: Abdomen is nondistended, soft and nontender. Normal bowel sounds heard. Central nervous system: Alert and awake but not following commands. Skin: No rashes, lesions or ulcers Psychiatry: Judgement and insight appear impaired.    Data Reviewed: I have personally reviewed following labs and imaging  studies  CBC:  Recent Labs Lab 06/04/16 1630 06/05/16 0137  WBC 8.2 11.2*  NEUTROABS 5.4 8.6*  HGB 12.6* 11.5*  HCT 37.6* 34.3*  MCV 89.7 89.6  PLT 350 762   Basic Metabolic Panel:  Recent Labs Lab 06/04/16 1630 06/04/16 2001 06/05/16 0137  NA 134*  --  133*  K 3.7  --  3.7  CL 101  --  103  CO2 22  --  21*  GLUCOSE 98  --  106*  BUN 11  --  10  CREATININE 1.01  --  0.93  CALCIUM 9.2  --  8.7*  MG  --  1.6*  --    GFR: Estimated Creatinine Clearance: 53.3 mL/min (by C-G formula based on SCr of 0.93 mg/dL). Liver Function  Tests:  Recent Labs Lab 06/04/16 1630  AST 40  ALT 17  ALKPHOS 77  BILITOT 0.9  PROT 6.8  ALBUMIN 3.6   No results for input(s): LIPASE, AMYLASE in the last 168 hours. No results for input(s): AMMONIA in the last 168 hours. Coagulation Profile: No results for input(s): INR, PROTIME in the last 168 hours. Cardiac Enzymes:  Recent Labs Lab 06/04/16 1630  TROPONINI <0.03   BNP (last 3 results) No results for input(s): PROBNP in the last 8760 hours. HbA1C: No results for input(s): HGBA1C in the last 72 hours. CBG:  Recent Labs Lab 06/04/16 1753  GLUCAP 97   Lipid Profile: No results for input(s): CHOL, HDL, LDLCALC, TRIG, CHOLHDL, LDLDIRECT in the last 72 hours. Thyroid Function Tests:  Recent Labs  06/04/16 2139  TSH 3.437   Anemia Panel:  Recent Labs  06/04/16 2001  VITAMINB12 755   Sepsis Labs:  Recent Labs Lab 06/04/16 1657 06/05/16 0137 06/05/16 0340  LATICACIDVEN 2.60* 0.9 0.7    Recent Results (from the past 240 hour(s))  Blood Culture (routine x 2)     Status: None (Preliminary result)   Collection Time: 06/04/16  4:30 PM  Result Value Ref Range Status   Specimen Description BLOOD RIGHT ANTECUBITAL  Final   Special Requests   Final    BOTTLES DRAWN AEROBIC ONLY Blood Culture adequate volume   Culture NO GROWTH < 24 HOURS  Final   Report Status PENDING  Incomplete  Blood Culture (routine x 2)     Status: None (Preliminary result)   Collection Time: 06/04/16  4:35 PM  Result Value Ref Range Status   Specimen Description BLOOD LEFT FOREARM  Final   Special Requests   Final    BOTTLES DRAWN AEROBIC ONLY Blood Culture adequate volume   Culture NO GROWTH < 24 HOURS  Final   Report Status PENDING  Incomplete         Radiology Studies: Dg Chest 2 View  Result Date: 06/04/2016 CLINICAL DATA:  Evaluate for pneumonia EXAM: CHEST  2 VIEW COMPARISON:  March 09, 2011 FINDINGS: The heart size is enlarged. The mid aorta is tortuous. There is  increased pulmonary interstitium bilaterally. There is minimal right pleural effusion. There is no focal pneumonia. The osseous structures are stable. IMPRESSION: Mild interstitial edema.  Small right pleural effusion. Electronically Signed   By: Abelardo Diesel M.D.   On: 06/04/2016 19:15   Dg Knee 1-2 Views Right  Result Date: 06/05/2016 CLINICAL DATA:  Right knee pain. EXAM: RIGHT KNEE - 1-2 VIEW COMPARISON:  11/25/2015. FINDINGS: Marked lateral joint space narrowing with progression and moderate medial joint space narrowing without significant change. Mild to moderate lateral spur  formation and moderate spur formation. Small loose body in the suprapatellar bursa. No effusion seen. Interval intramedullary rod in the femur. Atheromatous arterial calcifications. IMPRESSION: Severe lateral compartment degenerative changes with progression and moderate medial compartment degenerative changes without significant change. Electronically Signed   By: Claudie Revering M.D.   On: 06/05/2016 11:49   Ct Head Wo Contrast  Result Date: 06/04/2016 CLINICAL DATA:  Altered mental status EXAM: CT HEAD WITHOUT CONTRAST TECHNIQUE: Contiguous axial images were obtained from the base of the skull through the vertex without intravenous contrast. COMPARISON:  04/12/2016, 03/31/2016, 03/30/2016 FINDINGS: Brain: Examination is significantly degraded by gross patient motion, as well as positioning. No gross hemorrhage or mass is visualized. Periventricular white matter small vessel ischemic changes. Ventricle size grossly stable. Vascular: Carotid artery calcifications Skull: Limited due to motion Sinuses/Orbits: No acute abnormality Other: None IMPRESSION: Significantly limited exam secondary to patient motion and positioning. No gross acute abnormality is visualized Electronically Signed   By: Donavan Foil M.D.   On: 06/04/2016 17:23   Mr Brain Wo Contrast  Result Date: 06/04/2016 CLINICAL DATA:  Acute encephalopathy EXAM: MRI HEAD  WITHOUT CONTRAST TECHNIQUE: Multiplanar, multiecho pulse sequences of the brain and surrounding structures were obtained without intravenous contrast. COMPARISON:  Head CT 06/04/2016 FINDINGS: The examination had to be discontinued prior to completion due to patient agitation, fighting to get up scanner. Only axial diffusion weighted imaging was obtained and that is severely motion degraded. An axial ADC map was constructed from this data. The diffusion-weighted imaging shows no large area of diffusion restriction. There is no midline shift. IMPRESSION: Severely limited examination without evidence of large acute infarct. Electronically Signed   By: Ulyses Jarred M.D.   On: 06/04/2016 21:43   Dg Hip Unilat With Pelvis 2-3 Views Right  Result Date: 06/05/2016 CLINICAL DATA:  Right hip pain.  No known injury. EXAM: DG HIP (WITH OR WITHOUT PELVIS) 2-3V RIGHT COMPARISON:  11/24/2015 and 11/26/2015. FINDINGS: Hardware fixation of the previously demonstrated comminuted right intertrochanteric fracture with interval bridging callus. No acute fracture or dislocation seen. Marked diffuse osteopenia. Stable left hip prosthesis. Atheromatous arterial calcifications, including the abdominal aorta. IMPRESSION: 1. No acute fracture. 2. Severe osteopenia. 3. Aortic atherosclerosis. Electronically Signed   By: Claudie Revering M.D.   On: 06/05/2016 11:51   Dg Femur, Min 2 Views Right  Result Date: 06/05/2016 CLINICAL DATA:  Right leg pain.  No known injury. EXAM: RIGHT FEMUR 2 VIEWS COMPARISON:  11/26/2015. FINDINGS: Stable compression screw and rod fixation of the previously demonstrated comminuted right intertrochanteric fracture. Interval bridging callus. No acute fracture or dislocation. Atheromatous arterial calcifications. IMPRESSION: No acute fracture or dislocation. Electronically Signed   By: Claudie Revering M.D.   On: 06/05/2016 11:53        Scheduled Meds: . apixaban  5 mg Oral BID  . atorvastatin  80 mg Oral QHS   . multivitamin with minerals  1 tablet Oral Daily  . pantoprazole  40 mg Oral Daily  . sodium chloride flush  3 mL Intravenous Q12H  . sodium chloride flush  3 mL Intravenous Q12H  . tamsulosin  0.4 mg Oral QODAY   Continuous Infusions:   LOS: 0 days    Tichina Koebel Tanna Furry, MD [facility] Pager [phone]  If 7PM-7AM, please contact night-coverage [email or web address] Password [email or web address] 06/05/2016, 1:30 PM

## 2016-06-05 NOTE — Progress Notes (Signed)
Routine EEG completed, results pending.  Pt restless due to pain; RN notified.  Pt transported to XRay at completion of EEG.

## 2016-06-05 NOTE — Progress Notes (Signed)
[facility]  Patient Status: Active (receiving services up to time of hospitalization)  [facility] is providing the following services: PT  If patient discharges after hours, please call [phone].   Herbert Marquez 06/05/2016, 9:37 AM

## 2016-06-05 NOTE — Progress Notes (Signed)
PHARMACY - PHYSICIAN COMMUNICATION CRITICAL VALUE ALERT - BLOOD CULTURE IDENTIFICATION (BCID)  Results for orders placed or performed during the hospital encounter of 06/04/16  Blood Culture ID Panel (Reflexed) (Collected: 06/04/2016  4:35 PM)  Result Value Ref Range   Enterococcus species NOT DETECTED NOT DETECTED   Listeria monocytogenes NOT DETECTED NOT DETECTED   Staphylococcus species DETECTED (A) NOT DETECTED   Staphylococcus aureus NOT DETECTED NOT DETECTED   Methicillin resistance DETECTED (A) NOT DETECTED   Streptococcus species NOT DETECTED NOT DETECTED   Streptococcus agalactiae NOT DETECTED NOT DETECTED   Streptococcus pneumoniae NOT DETECTED NOT DETECTED   Streptococcus pyogenes NOT DETECTED NOT DETECTED   Acinetobacter baumannii NOT DETECTED NOT DETECTED   Enterobacteriaceae species NOT DETECTED NOT DETECTED   Enterobacter cloacae complex NOT DETECTED NOT DETECTED   Escherichia coli NOT DETECTED NOT DETECTED   Klebsiella oxytoca NOT DETECTED NOT DETECTED   Klebsiella pneumoniae NOT DETECTED NOT DETECTED   Proteus species NOT DETECTED NOT DETECTED   Serratia marcescens NOT DETECTED NOT DETECTED   Haemophilus influenzae NOT DETECTED NOT DETECTED   Neisseria meningitidis NOT DETECTED NOT DETECTED   Pseudomonas aeruginosa NOT DETECTED NOT DETECTED   Candida albicans NOT DETECTED NOT DETECTED   Candida glabrata NOT DETECTED NOT DETECTED   Candida krusei NOT DETECTED NOT DETECTED   Candida parapsilosis NOT DETECTED NOT DETECTED   Candida tropicalis NOT DETECTED NOT DETECTED    Name of physician (or Provider) Contacted: Dr. Carolin Sicks  Changes to prescribed antibiotics required:  Vanc 1500mg  IV Q24H for goal trough of 15-20 mcg/mL.  Follow up renal fxn, repeat BCx, abx LOT, vanc trough if indicated.   Jonika Critz D. Mina Marble, PharmD, [place] Pager:  [phone]  06/05/2016, 5:45 PM

## 2016-06-05 NOTE — Care Management Obs Status (Signed)
[facility] NOTIFICATION   Patient Details  Name: Herbert Marquez MRN: [id number] Date of Birth: November 25, 1927   Medicare Observation Status Notification Given:  Yes    Dawayne Patricia, RN 06/05/2016, 3:57 PM

## 2016-06-06 ENCOUNTER — Encounter (HOSPITAL_COMMUNITY): Payer: Self-pay | Admitting: General Practice

## 2016-06-06 DIAGNOSIS — R52 Pain, unspecified: Secondary | ICD-10-CM

## 2016-06-06 DIAGNOSIS — I10 Essential (primary) hypertension: Secondary | ICD-10-CM

## 2016-06-06 DIAGNOSIS — I2510 Atherosclerotic heart disease of native coronary artery without angina pectoris: Secondary | ICD-10-CM

## 2016-06-06 DIAGNOSIS — F0390 Unspecified dementia without behavioral disturbance: Secondary | ICD-10-CM

## 2016-06-06 DIAGNOSIS — E871 Hypo-osmolality and hyponatremia: Secondary | ICD-10-CM

## 2016-06-06 DIAGNOSIS — R4182 Altered mental status, unspecified: Secondary | ICD-10-CM

## 2016-06-06 DIAGNOSIS — I481 Persistent atrial fibrillation: Secondary | ICD-10-CM

## 2016-06-06 DIAGNOSIS — E118 Type 2 diabetes mellitus with unspecified complications: Secondary | ICD-10-CM

## 2016-06-06 DIAGNOSIS — R7881 Bacteremia: Secondary | ICD-10-CM

## 2016-06-06 DIAGNOSIS — M1711 Unilateral primary osteoarthritis, right knee: Secondary | ICD-10-CM

## 2016-06-06 DIAGNOSIS — D508 Other iron deficiency anemias: Secondary | ICD-10-CM

## 2016-06-06 DIAGNOSIS — D72829 Elevated white blood cell count, unspecified: Secondary | ICD-10-CM

## 2016-06-06 DIAGNOSIS — D62 Acute posthemorrhagic anemia: Secondary | ICD-10-CM

## 2016-06-06 LAB — FOLATE RBC
Folate, Hemolysate: 572 ng/mL
Folate, RBC: 1598 ng/mL (ref >498)
Hematocrit: 35.8 % — ABNORMAL LOW (ref 37.5–51.0)

## 2016-06-06 LAB — GLUCOSE, CAPILLARY: Glucose-Capillary: 131 mg/dL — ABNORMAL HIGH (ref 65–99)

## 2016-06-06 MED ORDER — DICLOFENAC SODIUM 1 % TD GEL
2.0000 g | Freq: Four times a day (QID) | TRANSDERMAL | Status: DC
  Administered 2016-06-06 – 2016-06-10 (×15): 2 g via TOPICAL
  Filled 2016-06-06: qty 100

## 2016-06-06 NOTE — Care Management Note (Signed)
Case Management Note Marvetta Gibbons RN, BSN Unit 2W-Case Manager [phone]  Patient Details  Name: Herbert Marquez MRN: [id number] Date of Birth: [birth year]  Subjective/Objective:  Pt admitted with acute encephalopathy- +BC                  Action/Plan: PTA pt lived at home with wife- per PT eval - recommendations for SNF- CSW consulted for placement needs.   Expected Discharge Date:                  Expected Discharge Plan:    In-House Referral:     Discharge planning Services  CM Consult  Post Acute Care Choice:    Choice offered to:     DME Arranged:    DME Agency:     HH Arranged:    HH Agency:     Status of Service:  In process, will continue to follow  If discussed at Long Length of Stay Meetings, dates discussed:    Additional Comments:  Dawayne Patricia, RN 06/06/2016, 3:41 PM

## 2016-06-06 NOTE — Discharge Instructions (Signed)
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
## 2016-06-06 NOTE — Evaluation (Signed)
Occupational Therapy Evaluation Patient Details Name: Herbert Marquez MRN: [id number] DOB: 08-08-1927 Today's Date: 06/06/2016    History of Present Illness 83 y.o. male with medical history significant for coronary artery disease with stent, history of CVA, GERD, and BPH who presents from his urologist's office for evaluation of acute change in mental status. MRI on 4/2 negative for acute infarct.   Clinical Impression   Per pts wife, pt was modified independent with ADL PTA. Currently pt overall max assist for bed mobility and requires varying levels of assist for ADL. Pt presenting with R wrist and knee pain, decreased activity tolerance, poor sitting balance, generalized weakness, and deconditioning, impacting his independence and safety with ADL and functional mobility. Recommending SNF for follow up to maximize independence and safety with ADL and functional mobility prior to return home. Pt would benefit from continued skilled OT to address established goals.    Follow Up Recommendations  SNF;Supervision/Assistance - 24 hour    Equipment Recommendations  None recommended by OT    Recommendations for Other Services       Precautions / Restrictions Precautions Precautions: Fall Precaution Comments: family denies any falls in last 3 months Restrictions Weight Bearing Restrictions: No Other Position/Activity Restrictions: pt has pain in rightr knee and hip - xrays negative with degenerative changes only.  pt now with pain in right wrist/thumb base - due to shot/IV at this area with inflammation.        Mobility Bed Mobility Overal bed mobility: Needs Assistance Bed Mobility: Supine to Sit;Sit to Supine     Supine to sit: Max assist;HOB elevated Sit to supine: Max assist   General bed mobility comments: Assist for LEs and trunk. Pt using bed rail to assist but needs max assist due to pain and fatigue  Transfers        General transfer comment: Pt declining today.     Balance Overall balance assessment: Needs assistance Sitting-balance support: Feet supported;Single extremity supported Sitting balance-Leahy Scale: Fair Sitting balance - Comments: Min guard for sitting balance Postural control: Right lateral lean (at times, not constant)                                 ADL either performed or assessed with clinical judgement   ADL Overall ADL's : Needs assistance/impaired Eating/Feeding: Minimal assistance;Sitting   Grooming: Moderate assistance;Sitting   Upper Body Bathing: Moderate assistance;Sitting   Lower Body Bathing: Maximal assistance   Upper Body Dressing : Minimal assistance;Sitting   Lower Body Dressing: Maximal assistance                 General ADL Comments: Pt declining transfers at this time; agreeable to sit EOB but reports he is in a lot of pain and tired.     Vision         Perception     Praxis      Pertinent Vitals/Pain Pain Assessment: Faces Pain Score: 9  Faces Pain Scale: Hurts even more Pain Location: R wrist, R knee Pain Descriptors / Indicators: Grimacing;Guarding;Sharp Pain Intervention(s): Monitored during session;Limited activity within patient's tolerance     Hand Dominance Right   Extremity/Trunk Assessment Upper Extremity Assessment Upper Extremity Assessment: RUE deficits/detail RUE Deficits / Details: increased edema noted, able to wiggle fingers RUE: Unable to fully assess due to pain   Lower Extremity Assessment Lower Extremity Assessment: Defer to PT evaluation   Cervical / Trunk Assessment  Cervical / Trunk Assessment: Kyphotic   Communication Communication Communication: HOH   Cognition Arousal/Alertness: Awake/alert Behavior During Therapy: WFL for tasks assessed/performed Overall Cognitive Status: Within Functional Limits for tasks assessed                                     General Comments       Exercises     Shoulder Instructions       Home Living Family/patient expects to be discharged to:: Private residence Living Arrangements: Spouse/significant other Available Help at Discharge: Family;Available 24 hours/day Type of Home: House       Home Layout: One level     Bathroom Shower/Tub: Occupational psychologist: Handicapped height Bathroom Accessibility: Yes How Accessible: Accessible via wheelchair Home Equipment: [place] - 2 wheels;Cane - single point;Wheelchair - Banker;Shower seat;Grab bars - tub/shower   Additional Comments: Spent a long time talking with pt and family about plans.  pt was home with [facility] services - he was walking with RW and wife following with wheelchair. He was able to transfer with wifes SBA only.      Prior Functioning/Environment Level of Independence: Independent with assistive device(s)        Comments: mainly at w/c level at home but able to transfer to toilet and shower on his own. bathes and dresses with increased time; wife assists if they are in a hurry.        OT Problem List: Decreased strength;Decreased activity tolerance;Impaired balance (sitting and/or standing);Impaired UE functional use;Pain;Increased edema      OT Treatment/Interventions: Self-care/ADL training;Therapeutic exercise;Energy conservation;DME and/or AE instruction;Therapeutic activities;Patient/family education;Balance training    OT Goals(Current goals can be found in the care plan section) Acute Rehab OT Goals Patient Stated Goal: return home OT Goal Formulation: With patient/family Time For Goal Achievement: 06/20/16 Potential to Achieve Goals: Good ADL Goals Pt Will Perform Grooming: with set-up;with supervision;sitting Pt Will Perform Upper Body Bathing: with set-up;with supervision;sitting Pt Will Perform Lower Body Bathing: with min assist;sit to/from stand Pt Will Transfer to Toilet: with min guard assist;stand pivot transfer;bedside commode Pt Will Perform Toileting  - Clothing Manipulation and hygiene: with min guard assist;sit to/from stand  OT Frequency: Min 2X/week   Barriers to D/C:            Co-evaluation              End of Session    Activity Tolerance: Patient limited by pain;Patient limited by fatigue Patient left: in bed;with call bell/phone within reach;with bed alarm set;with family/visitor present  OT Visit Diagnosis: Unsteadiness on feet (R26.81);Muscle weakness (generalized) (M62.81);Pain Pain - Right/Left: Right Pain - part of body: Hand;Knee                Time: 3086-5784 OT Time Calculation (min): 18 min Charges:  OT General Charges $OT Visit: 1 Procedure OT Evaluation $OT Eval Moderate Complexity: 1 Procedure G-Codes:     Julyssa Kyer A. Ulice Brilliant, M.S., OTR/L Pager: [place] 06/06/2016, 3:58 PM

## 2016-06-06 NOTE — Consult Note (Signed)
Physical Medicine and Rehabilitation Consult   Reason for Consult: Debility Referring Physician: Rosita Fire, MD   HPI: Herbert Marquez is a 83 y.o. 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 History taken from chart review and wife. He was making good progress at home and had completed HHPT. He was admitted on 06/04/16 via urology office with facial changes followed by mental status changes with agitation. MRI brain reviewed, but limited due to motion per report, secondary to severe agitation and  negative for large acute infarct. EEG done revealing moderate diffuse slowing. UA negative. Blood cultures with Staph epidermidis thought to be contaminant, but started on IV Vanc due to fevers and encephalopathy.     Review of Systems  Unable to perform ROS: Other  Somnolent.   Past Medical History:  Diagnosis Date  . Arthritis    "pretty much all over"   . Basal cell carcinoma    "several burned off his body, face, head"  . BPH (benign prostatic hypertrophy)   . Coronary artery disease    a. s/p PCI of RCA in 2006  . CVA (cerebral infarction)    a. 06/2015: left thalamic and bilateral PCA  . GERD (gastroesophageal reflux disease)   . Hyperlipidemia   . Hypertension   . TIA (transient ischemic attack)    Approximately 6 weeks post-cardiac catheterization.   . Type II diabetes mellitus ([place])    "prediabetic; lost alot of weight; not diabetic now" (06/16/2015)   Past Surgical History:  Procedure Laterality Date  . CARDIOVASCULAR STRESS TEST  07/01/2007   EF 74%  . CATARACT EXTRACTION, BILATERAL    . CORONARY ANGIOPLASTY WITH STENT PLACEMENT  10/2004   stenting x 2 to RCA  . FEMUR IM NAIL Right 11/26/2015   Procedure: INTRAMEDULLARY RIGHT (IM) NAIL FEMORAL;  Surgeon: Rod Can, MD;  Location: [facility];  Service:  Orthopedics;  Laterality: Right;  . HERNIA REPAIR    . HIP ARTHROPLASTY  03/09/2011   Procedure: ARTHROPLASTY BIPOLAR HIP;  Surgeon: Mauri Pole;  Location: [facility];  Service: Orthopedics;  Laterality: Left;  . LAPAROSCOPIC INCISIONAL / UMBILICAL / VENTRAL HERNIA REPAIR     "below his naval"   Family History  Problem Relation Age of Onset  . Hypertension Mother   . Lung cancer Father   . Lung cancer Brother    Social History:  reports that he quit smoking about 45 years ago. He has never used smokeless tobacco. He reports that he drinks about 4.8 oz of alcohol per week . He reports that he does not use drugs. Allergies: No Known Allergies Medications Prior to Admission  Medication Sig Dispense Refill  . acetaminophen (TYLENOL) 500 MG tablet Take 1 tablet (500 mg total) by mouth every 6 (six) hours as needed for mild pain. (Patient taking differently: Take 1,000 mg by mouth every 6 (six) hours as needed for mild pain. ) 30 tablet 0  . apixaban (ELIQUIS) 5 MG TABS tablet Take 1 tablet (5 mg total) by mouth 2 (two) times daily. 60 tablet 1  . atorvastatin (LIPITOR) 80 MG tablet Take 1 tablet (80 mg total) by mouth daily. (Patient taking differently: Take 80 mg by mouth at bedtime. ) 90 tablet 0  . Menthol, Topical Analgesic, (BLUE-EMU MAXIMUM STRENGTH EX) Apply 1 application  topically at bedtime. Knee pain    . Multiple Vitamin (MULTIVITAMIN WITH MINERALS) TABS tablet Take 1 tablet by mouth daily.    [name] omeprazole (PRILOSEC) 20 MG capsule Take 20 mg by mouth daily.    . polyvinyl alcohol (ARTIFICIAL TEARS) 1.4 % ophthalmic solution Place 1 drop into both eyes daily as needed for dry eyes.    . tamsulosin (FLOMAX) 0.4 MG CAPS capsule Take 0.4 mg by mouth every other day.     . citalopram (CELEXA) 10 MG tablet Take 10 mg by mouth daily.    . nitroGLYCERIN (NITROSTAT) 0.4 MG SL tablet Place 0.4 mg under the tongue every 5 (five) minutes as needed for chest pain.      Home: Home  Living Family/patient expects to be discharged to:: Private residence Living Arrangements: Spouse/significant other Additional Comments: Spent a long time talking with pt and family about plans.  pt was home with [facility] services - he was walking with RW and wife following with wheelchair. He was able to transfer with wifes SBA only.  Functional History: Prior Function Comments: pt reports he was up to walking 100 feet with RW and chair following - even on sunday (4 days ago).  65 year old wife offers all assistance.  Family has talked about hiring additional help but they havent up to this time Functional Status:  Mobility: Bed Mobility Overal bed mobility: Needs Assistance Bed Mobility: Supine to Sit Supine to sit: Min assist General bed mobility comments: pt has rail on his bed at home.  pt had hard time getting up to sitting as not wanting to use right arm for pushing due to pain Transfers Overall transfer level: Needs assistance Equipment used: Rolling walker (2 wheeled) Transfers: Sit to/from Stand, Stand Pivot Transfers Sit to Stand: Min assist, From elevated surface Stand pivot transfers: Min assist General transfer comment: pt wanting to stand on his own but unabel to as not wanting to put wieght through his right hand.  I did raise the bed to make standing easier.  pt had hard time managing RW (needed assist to do) when turning to sit in chair. pt reminded to reach bck with left hand but still sat with decreaed control. Ambulation/Gait General Gait Details: pt unable as cant put weight through RW for support.    ADL:    Cognition: Cognition Overall Cognitive Status: Within Functional Limits for tasks assessed Orientation Level: Disoriented to place, Disoriented to situation, Oriented to person Cognition Arousal/Alertness: Awake/alert Behavior During Therapy: Agitated (pt irritated about me having him get OOB when he is hurting) Overall Cognitive Status: Within Functional  Limits for tasks assessed  Blood pressure (!) 122/55, pulse 86, temperature 97.8 F (36.6 C), temperature source Oral, resp. rate 17, height 5\' 7"  (1.702 m), weight 74.1 kg (163 lb 4.8 oz), SpO2 97 %. Physical Exam  Vitals reviewed. Constitutional: He appears well-developed.  Frail  HENT:  Head: Normocephalic and atraumatic.  Poor dentition  Eyes: Conjunctivae and EOM are normal.  Neck: Normal range of motion. Neck supple.  Musculoskeletal: He exhibits edema (Right wrist) and tenderness (Right wrist and knee).  Neurological:  Neuro: Slightly confused and somnolent, briefly opens eyes and falls back to sleep HOH Motor: RUE/RLE: 0/5 (?participation and pain) LUE/LLE: 4/5  Skin: Skin is warm and dry. There is erythema (right wrist).  Psychiatric: His speech is delayed. He is slowed.    Results for orders placed or performed during the hospital encounter of 06/04/16 (from the past 24  hour(s))  Culture, blood (routine x 2)     Status: None (Preliminary result)   Collection Time: 06/05/16  7:04 PM  Result Value Ref Range   Specimen Description BLOOD LEFT ANTECUBITAL    Special Requests      BOTTLES DRAWN AEROBIC AND ANAEROBIC Blood Culture results may not be optimal due to an excessive volume of blood received in culture bottles   Culture NO GROWTH < 24 HOURS    Report Status PENDING   Culture, blood (routine x 2)     Status: None (Preliminary result)   Collection Time: 06/05/16  7:06 PM  Result Value Ref Range   Specimen Description BLOOD LEFT HAND    Special Requests      BOTTLES DRAWN AEROBIC ONLY Blood Culture results may not be optimal due to an excessive volume of blood received in culture bottles   Culture NO GROWTH < 24 HOURS    Report Status PENDING   Glucose, capillary     Status: Abnormal   Collection Time: 06/06/16  8:00 AM  Result Value Ref Range   Glucose-Capillary 131 (H) 65 - 99 mg/dL   Comment 1 Notify RN    Dg Chest 2 View  Result Date: 06/04/2016 CLINICAL  DATA:  Evaluate for pneumonia EXAM: CHEST  2 VIEW COMPARISON:  March 09, 2011 FINDINGS: The heart size is enlarged. The mid aorta is tortuous. There is increased pulmonary interstitium bilaterally. There is minimal right pleural effusion. There is no focal pneumonia. The osseous structures are stable. IMPRESSION: Mild interstitial edema.  Small right pleural effusion. Electronically Signed   By: Abelardo Diesel M.D.   On: 06/04/2016 19:15   Dg Knee 1-2 Views Right  Result Date: 06/05/2016 CLINICAL DATA:  Right knee pain. EXAM: RIGHT KNEE - 1-2 VIEW COMPARISON:  11/25/2015. FINDINGS: Marked lateral joint space narrowing with progression and moderate medial joint space narrowing without significant change. Mild to moderate lateral spur formation and moderate spur formation. Small loose body in the suprapatellar bursa. No effusion seen. Interval intramedullary rod in the femur. Atheromatous arterial calcifications. IMPRESSION: Severe lateral compartment degenerative changes with progression and moderate medial compartment degenerative changes without significant change. Electronically Signed   By: Claudie Revering M.D.   On: 06/05/2016 11:49   Ct Head Wo Contrast  Result Date: 06/04/2016 CLINICAL DATA:  Altered mental status EXAM: CT HEAD WITHOUT CONTRAST TECHNIQUE: Contiguous axial images were obtained from the base of the skull through the vertex without intravenous contrast. COMPARISON:  04/12/2016, 03/31/2016, 03/30/2016 FINDINGS: Brain: Examination is significantly degraded by gross patient motion, as well as positioning. No gross hemorrhage or mass is visualized. Periventricular white matter small vessel ischemic changes. Ventricle size grossly stable. Vascular: Carotid artery calcifications Skull: Limited due to motion Sinuses/Orbits: No acute abnormality Other: None IMPRESSION: Significantly limited exam secondary to patient motion and positioning. No gross acute abnormality is visualized Electronically Signed    By: Donavan Foil M.D.   On: 06/04/2016 17:23   Mr Brain Wo Contrast  Result Date: 06/04/2016 CLINICAL DATA:  Acute encephalopathy EXAM: MRI HEAD WITHOUT CONTRAST TECHNIQUE: Multiplanar, multiecho pulse sequences of the brain and surrounding structures were obtained without intravenous contrast. COMPARISON:  Head CT 06/04/2016 FINDINGS: The examination had to be discontinued prior to completion due to patient agitation, fighting to get up scanner. Only axial diffusion weighted imaging was obtained and that is severely motion degraded. An axial ADC map was constructed from this data. The diffusion-weighted imaging shows no large area of  diffusion restriction. There is no midline shift. IMPRESSION: Severely limited examination without evidence of large acute infarct. Electronically Signed   By: Ulyses Jarred M.D.   On: 06/04/2016 21:43   Dg Hip Unilat With Pelvis 2-3 Views Right  Result Date: 06/05/2016 CLINICAL DATA:  Right hip pain.  No known injury. EXAM: DG HIP (WITH OR WITHOUT PELVIS) 2-3V RIGHT COMPARISON:  11/24/2015 and 11/26/2015. FINDINGS: Hardware fixation of the previously demonstrated comminuted right intertrochanteric fracture with interval bridging callus. No acute fracture or dislocation seen. Marked diffuse osteopenia. Stable left hip prosthesis. Atheromatous arterial calcifications, including the abdominal aorta. IMPRESSION: 1. No acute fracture. 2. Severe osteopenia. 3. Aortic atherosclerosis. Electronically Signed   By: Claudie Revering M.D.   On: 06/05/2016 11:51   Dg Femur, Min 2 Views Right  Result Date: 06/05/2016 CLINICAL DATA:  Right leg pain.  No known injury. EXAM: RIGHT FEMUR 2 VIEWS COMPARISON:  11/26/2015. FINDINGS: Stable compression screw and rod fixation of the previously demonstrated comminuted right intertrochanteric fracture. Interval bridging callus. No acute fracture or dislocation. Atheromatous arterial calcifications. IMPRESSION: No acute fracture or dislocation.  Electronically Signed   By: Claudie Revering M.D.   On: 06/05/2016 11:53    Assessment/Plan: Diagnosis: Debility Labs and images independently reviewed.  Records reviewed and summated above.  1. Does the need for close, 24 hr/day medical supervision in concert with the patient's rehab needs make it unreasonable for this patient to be served in a less intensive setting? Potentially 2. 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 3. Due to safety, skin/wound care, disease management, medication administration, pain management and patient education, does the patient require 24 hr/day rehab nursing? Yes 4. Does the patient require coordinated care of a physician, rehab nurse, PT (1-2 hrs/day, 5 days/week), OT (1-2 hrs/day, 5 days/week) and SLP (1-2 hrs/day, 5 days/week) to address physical and functional deficits in the context of the above medical diagnosis(es)? Potentially Addressing deficits in the following areas: balance, endurance, locomotion, strength, transferring, dressing, feeding, toileting, cognition and psychosocial support 5. Can the patient actively participate in an intensive therapy program of at least 3 hrs of therapy per day at least 5 days per week? Potentially 6. The potential for patient to make measurable gains while on inpatient rehab is good 7. Anticipated functional outcomes upon discharge from inpatient rehab are supervision and min assist  with PT, supervision and min assist  with OT, supervision and min assist with SLP. 8. Estimated rehab length of stay to reach the above functional goals is: 15-18 days.  9. Does the patient have adequate social supports and living environment to accommodate these discharge functional goals? Potentially 10. Anticipated D/C setting: Home 11. Anticipated post D/C treatments: HH therapy and Home excercise program 12. Overall Rehab/Functional Prognosis: good and fair  RECOMMENDATIONS: This patient's condition is appropriate for continued rehabilitative care in the following setting: Would consider repeat MRI with sedative if patient truely is flaccid on right side. If weakness seconady to pareticipation, pt will likely require longer length of stay for recovery and therefore, recoomend SNF. Patient has agreed to participate  in recommended program. Potentially Note that insurance prior authorization may be required for reimbursement for recommended care.  Comment: Delice Lesch, MD, Mellody Drown  Delice Lesch, MD, [place] Pillar, [place] 06/06/2016

## 2016-06-06 NOTE — Clinical Social Work Note (Signed)
Clinical Social Work Assessment  Patient Details  Name: Herbert Marquez MRN: [id number] Date of Birth: 10/29/1927  Date of referral:  06/06/16               Reason for consult:  Discharge Planning                Permission sought to share information with:  Family Supports Permission granted to share information::  Yes, Verbal Permission Granted  Name::     Antaeus Karel  Agency::     Relationship::  spouse  Contact Information:  [phone]  Housing/Transportation Living arrangements for the past 2 months:  Single Family Home Source of Information:  Spouse Patient Interpreter Needed:  None Criminal Activity/Legal Involvement Pertinent to Current Situation/Hospitalization:  No - Comment as needed Significant Relationships:  Adult Children, Spouse Lives with:  Self, Spouse Do you feel safe going back to the place where you live?  No Need for family participation in patient care:  Yes (Comment)  Care giving concerns:  Family at bedside   Social Worker assessment / plan:  CSW met patient at bedside to offer support and discuss patients needs at discharge. Family at bedside stated they are agreeable for patein to discharge to SNF but would prefer CIR. Family agreeable to utilize SNF as a back up. Family would prefer [facility] because it is close to home. CSW to complete necessary paperwork and initiate SNF search on patient behalf. CSW to follow up with patient once bed offers are available. CSW remains available for support and to facilitate patient discharge needs once medically ready.  Employment status:  Retired Forensic scientist:  Medicare PT Recommendations:  [place] / Referral to community resources:  [place]  Patient/Family's Response to care:  Patient verbalized appreciation and understanding for CSW role and involvement in care. Patient agreeable with current discharge plan to CIR and SNF as a backup  Patient/Family's  Understanding of and Emotional Response to Diagnosis, Current Treatment, and Prognosis: Family with good understanding of current medical state and limitations around most recent hsopitalization  Emotional Assessment Appearance:  Appears stated age Attitude/Demeanor/Rapport:  Other Affect (typically observed):  Pleasant Orientation:  Oriented to Place, Oriented to Self Alcohol / Substance use:  Not Applicable Psych involvement (Current and /or in the community):  No (Comment)  Discharge Needs  Concerns to be addressed:  No discharge needs identified Readmission within the last 30 days:  No Current discharge risk:  None Barriers to Discharge:  No Barriers Identified   Wende Neighbors, LCSW 06/06/2016, 6:11 PM

## 2016-06-06 NOTE — Clinical Social Work Placement (Signed)
   CLINICAL SOCIAL WORK PLACEMENT  NOTE  Date:  06/06/2016  Patient Details  Name: Herbert Marquez MRN: [id number] Date of Birth: 1928/01/08  Clinical Social Work is seeking post-discharge placement for this patient at the [place] level of care (*CSW will initial, date and re-position this form in  chart as items are completed):  Yes   Patient/family provided with [facility] Work Department's list of facilities offering this level of care within the geographic area requested by the patient (or if unable, by the patient's family).  Yes   Patient/family informed of their freedom to choose among providers that offer the needed level of care, that participate in Medicare, Medicaid or managed care program needed by the patient, have an available bed and are willing to accept the patient.  Yes   Patient/family informed of [facility]'s ownership interest in [facility] and [facility], as well as of the fact that they are under no obligation to receive care at these facilities.  PASRR submitted to EDS on       PASRR number received on       Existing PASRR number confirmed on 06/06/16     FL2 transmitted to all facilities in geographic area requested by pt/family on       FL2 transmitted to all facilities within larger geographic area on       Patient informed that his/her managed care company has contracts with or will negotiate with certain facilities, including the following:            Patient/family informed of bed offers received.  Patient chooses bed at       Physician recommends and patient chooses bed at      Patient to be transferred to   on  .  Patient to be transferred to facility by       Patient family notified on   of transfer.  Name of family member notified:        PHYSICIAN Please sign FL2     Additional Comment:    _______________________________________________ Wende Neighbors, LCSW 06/06/2016, 6:20 PM

## 2016-06-06 NOTE — NC FL2 (Signed)
[place] LEVEL OF CARE SCREENING TOOL     IDENTIFICATION  Patient Name: Herbert Marquez Birthdate: 07-Nov-1927 Sex: male Admission Date (Current Location): 06/04/2016  [facility] and [place] Number:  Herbalist and Address:  [facility] [place], [place] [address], [place], [place] [address]      Provider Number: [id number]  Attending Physician Name and Address:  Rosita Fire, MD  Relative Name and Phone Number:       Current Level of Care: Hospital Recommended Level of Care: [place] Prior Approval Number:    Date Approved/Denied:   PASRR Number: [id number]  Discharge Plan: SNF    Current Diagnoses: Patient Active Problem List   Diagnosis Date Noted  . Pain   . Bacteremia due to Gram-positive bacteria   . Acute encephalopathy 06/04/2016  . PAF (paroxysmal atrial fibrillation) ([place])   . Chronic pain of left knee   . Iron deficiency anemia 04/04/2016  . History of CVA (cerebrovascular accident) 04/04/2016  . Chronic pain of right knee   . Cerebral hemorrhage (HCC) w/ SDH s/p IV tPA   . Coronary artery disease involving native coronary artery of native heart without angina pectoris   . History of right hip replacement   . Acute ischemic stroke ([place]) - L temporal lobe s/p tPA 03/30/2016  . BPH (benign prostatic hyperplasia) 11/25/2015  . GERD (gastroesophageal reflux disease) 11/25/2015  . CKD (chronic kidney disease), stage II 11/25/2015  . Urinary frequency   . Overactive bladder   . Cerebrovascular accident (CVA) due to bilateral occlusion of posterior cerebral arteries ([place]) 09/18/2015  . Gait disturbance, post-stroke 06/23/2015  . 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 Thalamic infarction ([place]) 06/21/2015  . Benign essential HTN   . Type 2 diabetes mellitus with complication, without long-term current use of insulin ([place])   . Dysphagia as late effect of cerebrovascular disease   . Fever   .  Hyponatremia   . HLD (hyperlipidemia)   . Obesity 03/09/2011  . COPD (chronic obstructive pulmonary disease) ([place]) 03/09/2011    Orientation RESPIRATION BLADDER Height & Weight     Self, Place  Normal Incontinent Weight: 163 lb 4.8 oz (74.1 kg) Height:  5\' 7"  (170.2 cm)  BEHAVIORAL SYMPTOMS/MOOD NEUROLOGICAL BOWEL NUTRITION STATUS      Continent Diet (diet soft)  AMBULATORY STATUS COMMUNICATION OF NEEDS Skin   Extensive Assist Verbally                         Personal Care Assistance Level of Assistance  Bathing, Feeding, Dressing Bathing Assistance: Limited assistance Feeding assistance: Independent Dressing Assistance: Limited assistance     Functional Limitations Info  Sight, Hearing, Speech Sight Info: Impaired Hearing Info: Impaired Speech Info: Adequate    SPECIAL CARE FACTORS FREQUENCY  PT (By licensed PT), OT (By licensed OT)     PT Frequency: 5x week OT Frequency: 5x week            Contractures Contractures Info: Not present    Additional Factors Info  Code Status Code Status Info: full code             Current Medications (06/06/2016):  This is the current hospital active medication list Current Facility-Administered Medications  Medication Dose Route Frequency Provider Last Rate Last Dose  . 0.9 %  sodium chloride infusion  250 mL Intravenous PRN Vianne Bulls, MD      .  acetaminophen (TYLENOL) tablet 650 mg  650 mg Oral Q6H PRN Vianne Bulls, MD   650 mg at 06/05/16 0837   Or  . acetaminophen (TYLENOL) suppository 650 mg  650 mg Rectal Q6H PRN Vianne Bulls, MD   650 mg at 06/04/16 2207  . apixaban (ELIQUIS) tablet 5 mg  5 mg Oral BID Vianne Bulls, MD   5 mg at 06/06/16 1029  . atorvastatin (LIPITOR) tablet 80 mg  80 mg Oral QHS Vianne Bulls, MD   80 mg at 06/05/16 2149  . diclofenac sodium (VOLTAREN) 1 % transdermal gel 2 g  2 g Topical QID Dron Tanna Furry, MD   2 g at 06/06/16 1736  . multivitamin with minerals tablet 1  tablet  1 tablet Oral Daily Vianne Bulls, MD   1 tablet at 06/06/16 1029  . ondansetron (ZOFRAN) tablet 4 mg  4 mg Oral Q6H PRN Vianne Bulls, MD       Or  . ondansetron (ZOFRAN) injection 4 mg  4 mg Intravenous Q6H PRN Vianne Bulls, MD      . oxyCODONE (Oxy IR/ROXICODONE) immediate release tablet 5 mg  5 mg Oral Q6H PRN Dron Tanna Furry, MD   5 mg at 06/05/16 1756  . pantoprazole (PROTONIX) EC tablet 40 mg  40 mg Oral Daily Vianne Bulls, MD   40 mg at 06/06/16 1029  . polyvinyl alcohol (LIQUIFILM TEARS) 1.4 % ophthalmic solution 1 drop  1 drop Both Eyes Daily PRN Ilene Qua Opyd, MD      . sodium chloride flush (NS) 0.9 % injection 3 mL  3 mL Intravenous Q12H Ilene Qua Opyd, MD   3 mL at 06/05/16 1004  . sodium chloride flush (NS) 0.9 % injection 3 mL  3 mL Intravenous Q12H Ilene Qua Opyd, MD   3 mL at 06/06/16 1029  . sodium chloride flush (NS) 0.9 % injection 3 mL  3 mL Intravenous PRN Ilene Qua Opyd, MD      . tamsulosin (FLOMAX) capsule 0.4 mg  0.4 mg Oral QODAY Ilene Qua Opyd, MD   0.4 mg at 06/05/16 1003  . vancomycin (VANCOCIN) 1,500 mg in sodium chloride 0.9 % 500 mL IVPB  1,500 mg Intravenous Q24H Thuy D Dang, RPH   1,500 mg at 06/06/16 1736     Discharge Medications: Please see discharge summary for a list of discharge medications.  Relevant Imaging Results:  Relevant Lab Results:   Additional Information SS#[id number]  Wende Neighbors, LCSW

## 2016-06-06 NOTE — Progress Notes (Signed)
PROGRESS NOTE    Herbert Marquez  XNA:[id number] DOB: Jun 10, 1927 DOA: 06/04/2016 PCP: Lillard Anes, NP   Brief Narrative: 83 y.o. male with medical history significant for coronary artery disease with stent, history of CVA, GERD, and BPH who presents from his urologist's office for evaluation of acute change in mental status. Patient has history of BPH and UTIs and was in the urology clinic for evaluation  when he became acutely altered, noted to be agitated. His wife witnessed the episode and reports that it began with "drawing up of the right face" briefly, and that was followed by the agitation and generalized weakness.  Assessment & Plan:   #  Acute encephalopathy of unknown etiology: ? Related to bacteremia -Patient was calm comfortable this morning. He was alert awake. He is hard of hearing. -Head CT and MRI with no acute finding however limited by motion -EEG with no epileptic foci -TSH, ammonia and b12 level unremarkable. -PT OT evaluation to community skilled nursing facility. Social worker was consulted. Inpatient rehabilitation was also consulted as per family's request. Mental status seems close to baseline this time.  #Staph epidermidis bacteremia: Likely contaminant. However given fever and encephalopathy, patient was started on IV vancomycin. Repeated blood culture on 4/3, follow-up results. I'll repeat CBC BMP tomorrow morning. Continue to monitor. -Influenza and respiratory viral panel negative -Continue supportive care.  #History of stroke: No acute finding., PT OT evaluation  #Right leg pain: X-ray of right leg pain which was consistent with degenerative changes with no acute finding. Patient denied pain today. Continue rehabilitation.  #Paroxysmal atrial fibrillation: on eliquis, monitor heart rate.  #History of coronary artery disease  Hyponatremia: Monitor BMP  DVT prophylaxis: Systemic anticoagulation Code Status: Full code Family Communication: Discussed with the  patient's daughter at bedside Disposition Plan: Likely discharge to SNF in 1-2 days    Consultants:   None  Procedures: EEG, CT MRI head Antimicrobials: None  Subjective: Patient was seen and examined at bedside. He was alert awake and comfortable. Heart of hearing. Denied headache, dizziness, nausea, vomiting, chest pain or shortness of breath. Patient's daughter at bedside.  Objective: Vitals:   06/05/16 2005 06/06/16 0239 06/06/16 0459 06/06/16 1259  BP: 118/62  123/71 (!) 122/55  Pulse: 66  88 86  Resp: 18  20 17   Temp: 98.7 F (37.1 C) 98.7 F (37.1 C) 98.1 F (36.7 C) 97.8 F (36.6 C)  TempSrc: Oral Oral Axillary Oral  SpO2: 95%  91% 97%  Weight:   74.1 kg (163 lb 4.8 oz)   Height:        Intake/Output Summary (Last 24 hours) at 06/06/16 1312 Last data filed at 06/06/16 1258  Gross per 24 hour  Intake              483 ml  Output             1900 ml  Net            -1417 ml   Filed Weights   06/04/16 2205 06/05/16 0439 06/06/16 0459  Weight: 74.7 kg (164 lb 11.2 oz) 74.6 kg (164 lb 6.4 oz) 74.1 kg (163 lb 4.8 oz)    Examination:  General exam: Alert awake and pleasant male lying in bed comfortable, Respiratory system: Clear bilateral. Respiratory effort normal. No wheezing or crackle Cardiovascular system: S1 & S2 heard, RRR.  No pedal edema. Gastrointestinal system: Abdomen is nondistended, soft and nontender. Normal bowel sounds heard. Central nervous system: Alert awake  and following commands. Patient is hard of hearing.. Skin: No rashes, lesions or ulcers Psychiatry: Judgement and insight appear impaired.    Data Reviewed: I have personally reviewed following labs and imaging studies  CBC:  Recent Labs Lab 06/04/16 1630 06/05/16 0137  WBC 8.2 11.2*  NEUTROABS 5.4 8.6*  HGB 12.6* 11.5*  HCT 37.6* 34.3*  MCV 89.7 89.6  PLT 350 706   Basic Metabolic Panel:  Recent Labs Lab 06/04/16 1630 06/04/16 2001 06/05/16 0137  NA 134*  --  133*    K 3.7  --  3.7  CL 101  --  103  CO2 22  --  21*  GLUCOSE 98  --  106*  BUN 11  --  10  CREATININE 1.01  --  0.93  CALCIUM 9.2  --  8.7*  MG  --  1.6*  --    GFR: Estimated Creatinine Clearance: 53.3 mL/min (by C-G formula based on SCr of 0.93 mg/dL). Liver Function Tests:  Recent Labs Lab 06/04/16 1630  AST 40  ALT 17  ALKPHOS 77  BILITOT 0.9  PROT 6.8  ALBUMIN 3.6   No results for input(s): LIPASE, AMYLASE in the last 168 hours.  Recent Labs Lab 06/05/16 1450  AMMONIA 37*   Coagulation Profile: No results for input(s): INR, PROTIME in the last 168 hours. Cardiac Enzymes:  Recent Labs Lab 06/04/16 1630  TROPONINI <0.03   BNP (last 3 results) No results for input(s): PROBNP in the last 8760 hours. HbA1C: No results for input(s): HGBA1C in the last 72 hours. CBG:  Recent Labs Lab 06/04/16 1753 06/06/16 0800  GLUCAP 97 131*   Lipid Profile: No results for input(s): CHOL, HDL, LDLCALC, TRIG, CHOLHDL, LDLDIRECT in the last 72 hours. Thyroid Function Tests:  Recent Labs  06/04/16 2139  TSH 3.437   Anemia Panel:  Recent Labs  06/04/16 2001 06/05/16 1450  VITAMINB12 755 736   Sepsis Labs:  Recent Labs Lab 06/04/16 1657 06/05/16 0137 06/05/16 0340  LATICACIDVEN 2.60* 0.9 0.7    Recent Results (from the past 240 hour(s))  Blood Culture (routine x 2)     Status: None (Preliminary result)   Collection Time: 06/04/16  4:30 PM  Result Value Ref Range Status   Specimen Description BLOOD RIGHT ANTECUBITAL  Final   Special Requests   Final    BOTTLES DRAWN AEROBIC ONLY Blood Culture adequate volume   Culture  Setup Time   Final    GRAM POSITIVE COCCI IN CLUSTERS AEROBIC BOTTLE ONLY CRITICAL VALUE NOTED.  VALUE IS CONSISTENT WITH PREVIOUSLY REPORTED AND CALLED VALUE.    Culture GRAM POSITIVE COCCI IDENTIFICATION TO FOLLOW   Final   Report Status PENDING  Incomplete  Blood Culture (routine x 2)     Status: Abnormal (Preliminary result)    Collection Time: 06/04/16  4:35 PM  Result Value Ref Range Status   Specimen Description BLOOD LEFT FOREARM  Final   Special Requests   Final    BOTTLES DRAWN AEROBIC ONLY Blood Culture adequate volume   Culture  Setup Time   Final    GRAM POSITIVE COCCI IN CLUSTERS AEROBIC BOTTLE ONLY Organism ID to follow CRITICAL RESULT CALLED TO, READ BACK BY AND VERIFIED WITH: P. Dang Pharm.D. 16:30 06/05/16 ([email or web address])    Culture STAPHYLOCOCCUS EPIDERMIDIS (A)  Final   Report Status PENDING  Incomplete  Blood Culture ID Panel (Reflexed)     Status: Abnormal   Collection Time: 06/04/16  4:35 PM  Result  Value Ref Range Status   Enterococcus species NOT DETECTED NOT DETECTED Final   Listeria monocytogenes NOT DETECTED NOT DETECTED Final   Staphylococcus species DETECTED (A) NOT DETECTED Final    Comment: Methicillin (oxacillin) resistant coagulase negative staphylococcus. Possible blood culture contaminant (unless isolated from more than one blood culture draw or clinical case suggests pathogenicity). No antibiotic treatment is indicated for blood  culture contaminants. CRITICAL RESULT CALLED TO, READ BACK BY AND VERIFIED WITH: P. Dang Pharm.D. 16:30 06/05/16 ([email or web address])    Staphylococcus aureus NOT DETECTED NOT DETECTED Final   Methicillin resistance DETECTED (A) NOT DETECTED Final    Comment: CRITICAL RESULT CALLED TO, READ BACK BY AND VERIFIED WITH: P. Dang Pharm.D. 16:30 06/05/16 ([email or web address])    Streptococcus species NOT DETECTED NOT DETECTED Final   Streptococcus agalactiae NOT DETECTED NOT DETECTED Final   Streptococcus pneumoniae NOT DETECTED NOT DETECTED Final   Streptococcus pyogenes NOT DETECTED NOT DETECTED Final   Acinetobacter baumannii NOT DETECTED NOT DETECTED Final   Enterobacteriaceae species NOT DETECTED NOT DETECTED Final   Enterobacter cloacae complex NOT DETECTED NOT DETECTED Final   Escherichia coli NOT DETECTED NOT DETECTED Final   Klebsiella oxytoca NOT DETECTED NOT DETECTED Final     Klebsiella pneumoniae NOT DETECTED NOT DETECTED Final   Proteus species NOT DETECTED NOT DETECTED Final   Serratia marcescens NOT DETECTED NOT DETECTED Final   Haemophilus influenzae NOT DETECTED NOT DETECTED Final   Neisseria meningitidis NOT DETECTED NOT DETECTED Final   Pseudomonas aeruginosa NOT DETECTED NOT DETECTED Final   Candida albicans NOT DETECTED NOT DETECTED Final   Candida glabrata NOT DETECTED NOT DETECTED Final   Candida krusei NOT DETECTED NOT DETECTED Final   Candida parapsilosis NOT DETECTED NOT DETECTED Final   Candida tropicalis NOT DETECTED NOT DETECTED Final  Respiratory Panel by PCR     Status: None   Collection Time: 06/05/16 12:10 PM  Result Value Ref Range Status   Adenovirus NOT DETECTED NOT DETECTED Final   Coronavirus 229E NOT DETECTED NOT DETECTED Final   Coronavirus HKU1 NOT DETECTED NOT DETECTED Final   Coronavirus NL63 NOT DETECTED NOT DETECTED Final   Coronavirus OC43 NOT DETECTED NOT DETECTED Final   Metapneumovirus NOT DETECTED NOT DETECTED Final   Rhinovirus / Enterovirus NOT DETECTED NOT DETECTED Final   Influenza A NOT DETECTED NOT DETECTED Final   Influenza B NOT DETECTED NOT DETECTED Final   Parainfluenza Virus 1 NOT DETECTED NOT DETECTED Final   Parainfluenza Virus 2 NOT DETECTED NOT DETECTED Final   Parainfluenza Virus 3 NOT DETECTED NOT DETECTED Final   Parainfluenza Virus 4 NOT DETECTED NOT DETECTED Final   Respiratory Syncytial Virus NOT DETECTED NOT DETECTED Final   Bordetella pertussis NOT DETECTED NOT DETECTED Final   Chlamydophila pneumoniae NOT DETECTED NOT DETECTED Final   Mycoplasma pneumoniae NOT DETECTED NOT DETECTED Final  Culture, blood (routine x 2)     Status: None (Preliminary result)   Collection Time: 06/05/16  7:04 PM  Result Value Ref Range Status   Specimen Description BLOOD LEFT ANTECUBITAL  Final   Special Requests   Final    BOTTLES DRAWN AEROBIC AND ANAEROBIC Blood Culture results may not be optimal due to an  excessive volume of blood received in culture bottles   Culture NO GROWTH < 24 HOURS  Final   Report Status PENDING  Incomplete  Culture, blood (routine x 2)     Status: None (Preliminary result)   Collection Time: 06/05/16  7:06 PM  Result Value Ref Range Status   Specimen Description BLOOD LEFT HAND  Final   Special Requests   Final    BOTTLES DRAWN AEROBIC ONLY Blood Culture results may not be optimal due to an excessive volume of blood received in culture bottles   Culture NO GROWTH < 24 HOURS  Final   Report Status PENDING  Incomplete         Radiology Studies: Dg Chest 2 View  Result Date: 06/04/2016 CLINICAL DATA:  Evaluate for pneumonia EXAM: CHEST  2 VIEW COMPARISON:  March 09, 2011 FINDINGS: The heart size is enlarged. The mid aorta is tortuous. There is increased pulmonary interstitium bilaterally. There is minimal right pleural effusion. There is no focal pneumonia. The osseous structures are stable. IMPRESSION: Mild interstitial edema.  Small right pleural effusion. Electronically Signed   By: Abelardo Diesel M.D.   On: 06/04/2016 19:15   Dg Knee 1-2 Views Right  Result Date: 06/05/2016 CLINICAL DATA:  Right knee pain. EXAM: RIGHT KNEE - 1-2 VIEW COMPARISON:  11/25/2015. FINDINGS: Marked lateral joint space narrowing with progression and moderate medial joint space narrowing without significant change. Mild to moderate lateral spur formation and moderate spur formation. Small loose body in the suprapatellar bursa. No effusion seen. Interval intramedullary rod in the femur. Atheromatous arterial calcifications. IMPRESSION: Severe lateral compartment degenerative changes with progression and moderate medial compartment degenerative changes without significant change. Electronically Signed   By: Claudie Revering M.D.   On: 06/05/2016 11:49   Ct Head Wo Contrast  Result Date: 06/04/2016 CLINICAL DATA:  Altered mental status EXAM: CT HEAD WITHOUT CONTRAST TECHNIQUE: Contiguous axial  images were obtained from the base of the skull through the vertex without intravenous contrast. COMPARISON:  04/12/2016, 03/31/2016, 03/30/2016 FINDINGS: Brain: Examination is significantly degraded by gross patient motion, as well as positioning. No gross hemorrhage or mass is visualized. Periventricular white matter small vessel ischemic changes. Ventricle size grossly stable. Vascular: Carotid artery calcifications Skull: Limited due to motion Sinuses/Orbits: No acute abnormality Other: None IMPRESSION: Significantly limited exam secondary to patient motion and positioning. No gross acute abnormality is visualized Electronically Signed   By: Donavan Foil M.D.   On: 06/04/2016 17:23   Mr Brain Wo Contrast  Result Date: 06/04/2016 CLINICAL DATA:  Acute encephalopathy EXAM: MRI HEAD WITHOUT CONTRAST TECHNIQUE: Multiplanar, multiecho pulse sequences of the brain and surrounding structures were obtained without intravenous contrast. COMPARISON:  Head CT 06/04/2016 FINDINGS: The examination had to be discontinued prior to completion due to patient agitation, fighting to get up scanner. Only axial diffusion weighted imaging was obtained and that is severely motion degraded. An axial ADC map was constructed from this data. The diffusion-weighted imaging shows no large area of diffusion restriction. There is no midline shift. IMPRESSION: Severely limited examination without evidence of large acute infarct. Electronically Signed   By: Ulyses Jarred M.D.   On: 06/04/2016 21:43   Dg Hip Unilat With Pelvis 2-3 Views Right  Result Date: 06/05/2016 CLINICAL DATA:  Right hip pain.  No known injury. EXAM: DG HIP (WITH OR WITHOUT PELVIS) 2-3V RIGHT COMPARISON:  11/24/2015 and 11/26/2015. FINDINGS: Hardware fixation of the previously demonstrated comminuted right intertrochanteric fracture with interval bridging callus. No acute fracture or dislocation seen. Marked diffuse osteopenia. Stable left hip prosthesis.  Atheromatous arterial calcifications, including the abdominal aorta. IMPRESSION: 1. No acute fracture. 2. Severe osteopenia. 3. Aortic atherosclerosis. Electronically Signed   By: Claudie Revering M.D.   On: 06/05/2016 11:51  Dg Femur, Min 2 Views Right  Result Date: 06/05/2016 CLINICAL DATA:  Right leg pain.  No known injury. EXAM: RIGHT FEMUR 2 VIEWS COMPARISON:  11/26/2015. FINDINGS: Stable compression screw and rod fixation of the previously demonstrated comminuted right intertrochanteric fracture. Interval bridging callus. No acute fracture or dislocation. Atheromatous arterial calcifications. IMPRESSION: No acute fracture or dislocation. Electronically Signed   By: Claudie Revering M.D.   On: 06/05/2016 11:53        Scheduled Meds: . apixaban  5 mg Oral BID  . atorvastatin  80 mg Oral QHS  . diclofenac sodium  2 g Topical QID  . multivitamin with minerals  1 tablet Oral Daily  . pantoprazole  40 mg Oral Daily  . sodium chloride flush  3 mL Intravenous Q12H  . sodium chloride flush  3 mL Intravenous Q12H  . tamsulosin  0.4 mg Oral QODAY  . vancomycin  1,500 mg Intravenous Q24H   Continuous Infusions:   LOS: 1 day    Shermaine Rivet Tanna Furry, MD [facility] Pager [phone]  If 7PM-7AM, please contact night-coverage [email or web address] Password [email or web address] 06/06/2016, 1:12 PM

## 2016-06-07 ENCOUNTER — Inpatient Hospital Stay (HOSPITAL_COMMUNITY): Payer: Medicare Other

## 2016-06-07 LAB — BASIC METABOLIC PANEL
Anion gap: 9 (ref 5–15)
BUN: 7 mg/dL (ref 6–20)
CO2: 20 mmol/L — ABNORMAL LOW (ref 22–32)
Calcium: 8.4 mg/dL — ABNORMAL LOW (ref 8.9–10.3)
Chloride: 102 mmol/L (ref 101–111)
Creatinine, Ser: 0.82 mg/dL (ref 0.61–1.24)
GFR calc Af Amer: >60 mL/min (ref >60)
GFR calc non Af Amer: >60 mL/min (ref >60)
Glucose, Bld: 112 mg/dL — ABNORMAL HIGH (ref 65–99)
Potassium: 3.4 mmol/L — ABNORMAL LOW (ref 3.5–5.1)
Sodium: 131 mmol/L — ABNORMAL LOW (ref 135–145)

## 2016-06-07 LAB — CULTURE, BLOOD (ROUTINE X 2)
Special Requests: ADEQUATE
Special Requests: ADEQUATE

## 2016-06-07 LAB — MAGNESIUM: Magnesium: 1.8 mg/dL (ref 1.7–2.4)

## 2016-06-07 LAB — CBC
HCT: 34.8 % — ABNORMAL LOW (ref 39.0–52.0)
Hemoglobin: 11.5 g/dL — ABNORMAL LOW (ref 13.0–17.0)
MCH: 29.8 pg (ref 26.0–34.0)
MCHC: 33 g/dL (ref 30.0–36.0)
MCV: 90.2 fL (ref 78.0–100.0)
Platelets: 318 10*3/uL (ref 150–400)
RBC: 3.86 MIL/uL — ABNORMAL LOW (ref 4.22–5.81)
RDW: 15.4 % (ref 11.5–15.5)
WBC: 11.4 10*3/uL — ABNORMAL HIGH (ref 4.0–10.5)

## 2016-06-07 MED ORDER — POTASSIUM CHLORIDE 20 MEQ/15ML (10%) PO SOLN
20.0000 meq | Freq: Every day | ORAL | Status: DC
  Administered 2016-06-07 – 2016-06-10 (×4): 20 meq via ORAL
  Filled 2016-06-07 (×3): qty 15

## 2016-06-07 MED ORDER — POTASSIUM CHLORIDE IN NACL 40-0.9 MEQ/L-% IV SOLN
INTRAVENOUS | Status: DC
  Administered 2016-06-07 – 2016-06-08 (×2): 75 mL/h via INTRAVENOUS
  Filled 2016-06-07 (×4): qty 1000

## 2016-06-07 MED ORDER — LORAZEPAM 0.5 MG PO TABS: 0.5000 mg | ORAL_TABLET | Freq: Once | ORAL | Status: DC | PRN

## 2016-06-07 MED ORDER — HALOPERIDOL LACTATE 5 MG/ML IJ SOLN
1.0000 mg | Freq: Once | INTRAMUSCULAR | Status: AC
  Administered 2016-06-07: 1 mg via INTRAVENOUS
  Filled 2016-06-07: qty 1

## 2016-06-07 NOTE — Progress Notes (Signed)
qPhysical Therapy Treatment Patient Details Name: Herbert Marquez MRN: [id number] DOB: 09-24-1927 Today's Date: 06/07/2016    History of Present Illness 83 y.o. male with medical history significant for coronary artery disease with stent, history of CVA, GERD, and BPH who presents from his urologist's office for evaluation of acute change in mental status. MRI on 4/2 negative for acute infarct.    PT Comments    Per spouse, present for treatment, patient did not sleep well last night, is sleepy today, but she would like him to try to wake up today.  Patient agreeable to therapy effort, pain in wrist is better, Right hip and knee still painful, but more like his chronic pain vs as bad as last time.  Patient able to participate with therapy, leaning to Left side in sitting and standing, can adjust temporarily to midline position, but returns to leaning quickly.  Unable to transfer to chair or ambulate today due to lethargy, and increased level of assistance today.  Patient is anticipated to improve over time with therapy, but may need increased time to meet goals.   Follow Up Recommendations  SNF;Supervision/Assistance - 24 hour     Equipment Recommendations  None recommended by PT    Recommendations for Other Services OT consult     Precautions / Restrictions Precautions Precautions: Fall Precaution Comments: family denies any falls in last 3 months Restrictions Weight Bearing Restrictions: No Other Position/Activity Restrictions: Pain in R knee (chronic injury), and R hip (rod placed in ~Oct 2017 per spouse).    Mobility  Bed Mobility Overal bed mobility: Needs Assistance Bed Mobility: Supine to Sit;Sit to Supine     Supine to sit: Max assist;HOB elevated Sit to supine: Max assist   General bed mobility comments: Assist for LEs and trunk. Pt using bed rail to assist but needs max assist due to pain and fatigue  Transfers Overall transfer level: Needs assistance Equipment  used: Rolling walker (2 wheeled) Transfers: Sit to/from [employer] Transfers Sit to Stand: Max assist;Mod assist         General transfer comment: Leaning to Left side, impacts transfer ability  Ambulation/Gait             General Gait Details: Unable, Left lateral lean, standing only   Stairs            Wheelchair Mobility    Modified Rankin (Stroke Patients Only)       Balance Overall balance assessment: Needs assistance Sitting-balance support: Feet supported;Single extremity supported Sitting balance-Leahy Scale: Poor Sitting balance - Comments: Left lateral lean, can sit midline when cued, but returns to Left Postural control: Left lateral lean Standing balance support: Bilateral upper extremity supported Standing balance-Leahy Scale: Poor Standing balance comment: MOD assist plus cues for midline stand, wide base of support, and low activity tolerance.                            Cognition Arousal/Alertness: Lethargic Behavior During Therapy: WFL for tasks assessed/performed Overall Cognitive Status: Within Functional Limits for tasks assessed                                 General Comments: Confusion persists, but was able to answer appropriately with cues from spouse      Exercises General Exercises - Lower Extremity Short Arc QuadSinclair Marquez;Both;10 reps;Supine Heel Slides: AAROM;Both;10 reps;Supine Hip ABduction/ADduction: AAROM;Both;10  reps;Supine Straight Leg Raises: AAROM;Both;10 reps;Supine    General Comments        Pertinent Vitals/Pain Pain Assessment: Faces Faces Pain Scale: Hurts even more Pain Location: R hip and R knee Pain Descriptors / Indicators: Grimacing;Guarding;Sharp Pain Intervention(s): Monitored during session;Repositioned;Limited activity within patient's tolerance    Home Living                      Prior Function            PT Goals (current goals can now be found in  the care plan section) Acute Rehab PT Goals Patient Stated Goal: return home, per spouse may need rehab first PT Goal Formulation: With patient/family Time For Goal Achievement: 06/13/16 Potential to Achieve Goals: Good Progress towards PT goals: Not progressing toward goals - comment (Confusion and lethargy today, did not sleep well last night )    Frequency    Min 3X/week      PT Plan Current plan remains appropriate    Co-evaluation             End of Session Equipment Utilized During Treatment: Gait belt Activity Tolerance: Patient limited by pain;Patient limited by lethargy Patient left: in bed;with bed alarm set;with family/visitor present;with nursing/sitter in room Nurse Communication: Mobility status PT Visit Diagnosis: Unsteadiness on feet (R26.81);Difficulty in walking, not elsewhere classified (R26.2);Pain Pain - Right/Left: Right Pain - part of body: Knee;Hip     Time: 1975-8832 PT Time Calculation (min) (ACUTE ONLY): 30 min  Charges:  $Therapeutic Exercise: 8-22 mins $Therapeutic Activity: 8-22 mins                    G Codes:       Herbert Marquez, DPT   Herbert Marquez, Herbert Marquez L 06/07/2016, 12:07 PM

## 2016-06-07 NOTE — Evaluation (Signed)
Clinical/Bedside Swallow Evaluation Patient Details  Name: Herbert Marquez MRN: [id number] Date of Birth: 1927/03/21  Today's Date: 06/07/2016 Time: SLP Start Time (ACUTE ONLY): 1448 SLP Stop Time (ACUTE ONLY): 1601 SLP Time Calculation (min) (ACUTE ONLY): 19 min  Past Medical History:  Past Medical History:  Diagnosis Date  . Arthritis    "pretty much all over"   . Basal cell carcinoma    "several burned off his body, face, head"  . BPH (benign prostatic hypertrophy)   . Coronary artery disease    a. s/p PCI of RCA in 2006  . CVA (cerebral infarction)    a. 06/2015: left thalamic and bilateral PCA  . GERD (gastroesophageal reflux disease)   . Hyperlipidemia   . Hypertension   . TIA (transient ischemic attack)    Approximately 6 weeks post-cardiac catheterization.   . Type II diabetes mellitus ([place])    "prediabetic; lost alot of weight; not diabetic now" (06/16/2015)   Past Surgical History:  Past Surgical History:  Procedure Laterality Date  . CARDIOVASCULAR STRESS TEST  07/01/2007   EF 74%  . CATARACT EXTRACTION, BILATERAL    . CORONARY ANGIOPLASTY WITH STENT PLACEMENT  10/2004   stenting x 2 to RCA  . FEMUR IM NAIL Right 11/26/2015   Procedure: INTRAMEDULLARY RIGHT (IM) NAIL FEMORAL;  Surgeon: Rod Can, MD;  Location: [facility];  Service: Orthopedics;  Laterality: Right;  . HERNIA REPAIR    . HIP ARTHROPLASTY  03/09/2011   Procedure: ARTHROPLASTY BIPOLAR HIP;  Surgeon: Mauri Pole;  Location: [facility];  Service: Orthopedics;  Laterality: Left;  . LAPAROSCOPIC INCISIONAL / UMBILICAL / VENTRAL HERNIA REPAIR     "below his naval"   HPI:  PT is an 83 y.o.malewith medical history significant forcoronary artery disease with stent, history of CVA, GERD, and BPH who presents from his urologist'soffice for evaluation of acute change in mental status. Patient has history of BPH and UTIs and was in the urology clinic for evaluation when he became acutely altered, noted to be  agitated. His wife witnessed the episode and reports that it began with "drawing up of the right face" briefly, and that was followed by the agitation and generalized weakness   Assessment / Plan / Recommendation Clinical Impression  Pt presents with a primary oral based dysphagia and suspected mild pharyngeal dysphagia of multifactorial etiology: hx of CVA as well as cognitive deficits with fluctuation in mentation. Spouse present who reports pt with recent decline in swallow function with specific difficulty with solid foods and extended chewing/oral residuals. Prolonged mastication evidenced with solid trials this date and as well as left sided pocketting. Pt with reduced intraoral pressure with intermitent anterior spillage of thin liquids by cup sip however no overt s/sx of aspiration with any PO. Recommend diet downgrade to dysphagia 1 (puree) to maximize intake and reduce aspiration risk and thin liquids with medicines whole in puree. Spouse in agreement with diet modification with hopes of upgrade pending stability of mentation. ST to continue to monitor.  SLP Visit Diagnosis: Dysphagia, oral phase (R13.11)    Aspiration Risk  Moderate aspiration risk;Mild aspiration risk    Diet Recommendation   Dysphagia 1 (puree) thin liquids  Medication Administration: Whole meds with puree    Other  Recommendations Oral Care Recommendations: Oral care BID   Follow up Recommendations 24 hour supervision/assistance      Frequency and Duration min 1 x/week  1 week       Prognosis Prognosis for  Safe Diet Advancement: Good Barriers to Reach Goals: Cognitive deficits      Swallow Study   General Date of Onset: 06/04/16 HPI: PT is an 83 y.o.malewith medical history significant forcoronary artery disease with stent, history of CVA, GERD, and BPH who presents from his urologist'soffice for evaluation of acute change in mental status. Patient has history of BPH and UTIs and was in the urology  clinic for evaluation when he became acutely altered, noted to be agitated. His wife witnessed the episode and reports that it began with "drawing up of the right face" briefly, and that was followed by the agitation and generalized weakness Type of Study: Bedside Swallow Evaluation Previous Swallow Assessment: 2017 BSE: D2, thin  Diet Prior to this Study: Dysphagia 3 (soft);Thin liquids Temperature Spikes Noted: No Respiratory Status: Room air History of Recent Intubation: No Behavior/Cognition: Alert;Pleasant mood;Cooperative Oral Cavity Assessment: Dry;Dried secretions Oral Care Completed by SLP: Yes Oral Cavity - Dentition: Missing dentition Self-Feeding Abilities: Needs assist Patient Positioning: Upright in bed Baseline Vocal Quality: Low vocal intensity Volitional Cough: Strong Volitional Swallow: Able to elicit    Oral/Motor/Sensory Function Overall Oral Motor/Sensory Function: Generalized oral weakness   Ice Chips Ice chips: Not tested   Thin Liquid Thin Liquid: Impaired Presentation: Cup;Straw Oral Phase Impairments: Reduced labial seal Oral Phase Functional Implications: Prolonged oral transit Pharyngeal  Phase Impairments: Suspected delayed Swallow;Multiple swallows    Nectar Thick Nectar Thick Liquid: Not tested   Honey Thick Honey Thick Liquid: Not tested   Puree Puree: Within functional limits   Solid   GO   Solid: Impaired Presentation: Self Fed Oral Phase Impairments: Impaired mastication;Reduced lingual movement/coordination Oral Phase Functional Implications: Left lateral sulci pocketing;Prolonged oral transit;Impaired mastication;Oral residue Pharyngeal Phase Impairments: Suspected delayed Swallow       [name] MA, CCC-SLP Acute Care Speech Language Pathologist    Levi Aland 06/07/2016,4:15 PM

## 2016-06-07 NOTE — Progress Notes (Signed)
PROGRESS NOTE    Herbert Marquez  TAV:[id number] DOB: [birth year] DOA: 06/04/2016 PCP: Lillard Anes, NP   Brief Narrative: 83 y.o. male with medical history significant for coronary artery disease with stent, history of CVA, GERD, and BPH who presents from his urologist's office for evaluation of acute change in mental status. Patient has history of BPH and UTIs and was in the urology clinic for evaluation  when he became acutely altered, noted to be agitated. His wife witnessed the episode and reports that it began with "drawing up of the right face" briefly, and that was followed by the agitation and generalized weakness.  Assessment & Plan:   #  Acute encephalopathy of unknown etiology and delirium -patient was confused and agitated last night and better this morning. Likely this is acute delirium.  -Head CT and MRI with no acute finding however limited by motion. EEG with no epileptic foci -TSH, ammonia and b12 level unremarkable. -Since the prior MRI was limited because of motion, plan to repeat MRI with ativan oral prior to the procedure. I discussed with the patient's daughter and wife who like to proceed with the test. I don't think pt should undergo anesthesia for MRI. I reviewed CIR's note.  -PT OT evaluation ongoing, likely dc to SNF vs CIR. SW and rehab is following.  -ordered SLP eval because of concern of dysphagia. Encourage oral intake. Discussed with RN and families. Start IVF  #Staph epidermidis, micrococcus bacteremia: Likely contaminant. Initially treated with IV vancomycin until the final culture result (pt also with mild fever, leukocytosis and encephalopathy). Since the other culture bottle grew micrococcus it is most likely contaminant. I reviewed/consulted this to Dr. Tommy Medal from ID. He recommended to dc antibiotics. Follow up repeat culture results, negative so far.  -Influenza and respiratory viral panel negative -Continue supportive care.  #History of stroke: No acute  finding., PT OT evaluation  #Right leg pain: X-ray of right leg pain which was consistent with degenerative changes with no acute finding. Patient denied pain today. Continue rehabilitation.  #Paroxysmal atrial fibrillation: on eliquis, monitor heart rate.  #History of coronary artery disease  # Hyponatremia/hypokalemia: Monitor BMP. On IVF and KCL replacement.   DVT prophylaxis: Systemic anticoagulation Code Status: Full code Family Communication: Discussed with the patient's daughter and wife  at bedside Disposition Plan: Likely discharge to SNF in 1-2 days    Consultants:   None  Procedures: EEG, CT MRI head Antimicrobials: None  Subjective: Patient was seen and examined at bedside. Overnight event noted. Today, he was calm and comfortable. Denied pain. He is hard of hearing.  Objective: Vitals:   06/06/16 0459 06/06/16 1259 06/06/16 2220 06/07/16 0452  BP: 123/71 (!) 122/55 128/66 123/69  Pulse: 88 86 78 80  Resp: 20 17 18 18   Temp: 98.1 F (36.7 C) 97.8 F (36.6 C) 98.2 F (36.8 C) 97.5 F (36.4 C)  TempSrc: Axillary Oral Oral Oral  SpO2: 91% 97% 95% 94%  Weight: 74.1 kg (163 lb 4.8 oz)   74.2 kg (163 lb 9.6 oz)  Height:        Intake/Output Summary (Last 24 hours) at 06/07/16 1310 Last data filed at 06/07/16 0800  Gross per 24 hour  Intake             1100 ml  Output              700 ml  Net  400 ml   Filed Weights   06/05/16 0439 06/06/16 0459 06/07/16 0452  Weight: 74.6 kg (164 lb 6.4 oz) 74.1 kg (163 lb 4.8 oz) 74.2 kg (163 lb 9.6 oz)    Examination:  General exam: alert, awake and comfortable. Hard of hearing Respiratory system: clear bilateral. Respiratory effort normal. No wheezing or crackle Cardiovascular system: S1 & S2 heard, RRR.  No pedal edema. Gastrointestinal system: Abdomen is nondistended, soft and nontender. Normal bowel sounds heard. Central nervous system: Alert awake and following commands. Oriented to name and  hospital. Skin: No rashes, lesions or ulcers Psychiatry: Judgement and insight appear impaired.    Data Reviewed: I have personally reviewed following labs and imaging studies  CBC:  Recent Labs Lab 06/04/16 1630 06/04/16 2139 06/05/16 0137 06/07/16 0237  WBC 8.2  --  11.2* 11.4*  NEUTROABS 5.4  --  8.6*  --   HGB 12.6*  --  11.5* 11.5*  HCT 37.6* 35.8* 34.3* 34.8*  MCV 89.7  --  89.6 90.2  PLT 350  --  320 829   Basic Metabolic Panel:  Recent Labs Lab 06/04/16 1630 06/04/16 2001 06/05/16 0137 06/07/16 0237  NA 134*  --  133* 131*  K 3.7  --  3.7 3.4*  CL 101  --  103 102  CO2 22  --  21* 20*  GLUCOSE 98  --  106* 112*  BUN 11  --  10 7  CREATININE 1.01  --  0.93 0.82  CALCIUM 9.2  --  8.7* 8.4*  MG  --  1.6*  --  1.8   GFR: Estimated Creatinine Clearance: 60.5 mL/min (by C-G formula based on SCr of 0.82 mg/dL). Liver Function Tests:  Recent Labs Lab 06/04/16 1630  AST 40  ALT 17  ALKPHOS 77  BILITOT 0.9  PROT 6.8  ALBUMIN 3.6   No results for input(s): LIPASE, AMYLASE in the last 168 hours.  Recent Labs Lab 06/05/16 1450  AMMONIA 37*   Coagulation Profile: No results for input(s): INR, PROTIME in the last 168 hours. Cardiac Enzymes:  Recent Labs Lab 06/04/16 1630  TROPONINI <0.03   BNP (last 3 results) No results for input(s): PROBNP in the last 8760 hours. HbA1C: No results for input(s): HGBA1C in the last 72 hours. CBG:  Recent Labs Lab 06/04/16 1753 06/06/16 0800  GLUCAP 97 131*   Lipid Profile: No results for input(s): CHOL, HDL, LDLCALC, TRIG, CHOLHDL, LDLDIRECT in the last 72 hours. Thyroid Function Tests:  Recent Labs  06/04/16 2139  TSH 3.437   Anemia Panel:  Recent Labs  06/04/16 2001 06/05/16 1450  VITAMINB12 755 736   Sepsis Labs:  Recent Labs Lab 06/04/16 1657 06/05/16 0137 06/05/16 0340  LATICACIDVEN 2.60* 0.9 0.7    Recent Results (from the past 240 hour(s))  Blood Culture (routine x 2)      Status: Abnormal   Collection Time: 06/04/16  4:30 PM  Result Value Ref Range Status   Specimen Description BLOOD RIGHT ANTECUBITAL  Final   Special Requests   Final    BOTTLES DRAWN AEROBIC ONLY Blood Culture adequate volume   Culture  Setup Time   Final    GRAM POSITIVE COCCI IN CLUSTERS AEROBIC BOTTLE ONLY CRITICAL VALUE NOTED.  VALUE IS CONSISTENT WITH PREVIOUSLY REPORTED AND CALLED VALUE.    Culture (A)  Final    MICROCOCCUS LUTEUS/LYLAE Standardized susceptibility testing for this organism is not available.    Report Status 06/07/2016 FINAL  Final  Blood Culture (routine x 2)     Status: Abnormal   Collection Time: 06/04/16  4:35 PM  Result Value Ref Range Status   Specimen Description BLOOD LEFT FOREARM  Final   Special Requests   Final    BOTTLES DRAWN AEROBIC ONLY Blood Culture adequate volume   Culture  Setup Time   Final    GRAM POSITIVE COCCI IN CLUSTERS AEROBIC BOTTLE ONLY CRITICAL RESULT CALLED TO, READ BACK BY AND VERIFIED WITH: P. Dang Pharm.D. 16:30 06/05/16 ([email or web address])    Culture (A)  Final    STAPHYLOCOCCUS EPIDERMIDIS THE SIGNIFICANCE OF ISOLATING THIS ORGANISM FROM A SINGLE SET OF BLOOD CULTURES WHEN MULTIPLE SETS ARE DRAWN IS UNCERTAIN. PLEASE NOTIFY THE MICROBIOLOGY DEPARTMENT WITHIN ONE WEEK IF SPECIATION AND SENSITIVITIES ARE REQUIRED.    Report Status 06/07/2016 FINAL  Final  Blood Culture ID Panel (Reflexed)     Status: Abnormal   Collection Time: 06/04/16  4:35 PM  Result Value Ref Range Status   Enterococcus species NOT DETECTED NOT DETECTED Final   Listeria monocytogenes NOT DETECTED NOT DETECTED Final   Staphylococcus species DETECTED (A) NOT DETECTED Final    Comment: Methicillin (oxacillin) resistant coagulase negative staphylococcus. Possible blood culture contaminant (unless isolated from more than one blood culture draw or clinical case suggests pathogenicity). No antibiotic treatment is indicated for blood  culture contaminants. CRITICAL RESULT  CALLED TO, READ BACK BY AND VERIFIED WITH: P. Dang Pharm.D. 16:30 06/05/16 ([email or web address])    Staphylococcus aureus NOT DETECTED NOT DETECTED Final   Methicillin resistance DETECTED (A) NOT DETECTED Final    Comment: CRITICAL RESULT CALLED TO, READ BACK BY AND VERIFIED WITH: P. Dang Pharm.D. 16:30 06/05/16 ([email or web address])    Streptococcus species NOT DETECTED NOT DETECTED Final   Streptococcus agalactiae NOT DETECTED NOT DETECTED Final   Streptococcus pneumoniae NOT DETECTED NOT DETECTED Final   Streptococcus pyogenes NOT DETECTED NOT DETECTED Final   Acinetobacter baumannii NOT DETECTED NOT DETECTED Final   Enterobacteriaceae species NOT DETECTED NOT DETECTED Final   Enterobacter cloacae complex NOT DETECTED NOT DETECTED Final   Escherichia coli NOT DETECTED NOT DETECTED Final   Klebsiella oxytoca NOT DETECTED NOT DETECTED Final   Klebsiella pneumoniae NOT DETECTED NOT DETECTED Final   Proteus species NOT DETECTED NOT DETECTED Final   Serratia marcescens NOT DETECTED NOT DETECTED Final   Haemophilus influenzae NOT DETECTED NOT DETECTED Final   Neisseria meningitidis NOT DETECTED NOT DETECTED Final   Pseudomonas aeruginosa NOT DETECTED NOT DETECTED Final   Candida albicans NOT DETECTED NOT DETECTED Final   Candida glabrata NOT DETECTED NOT DETECTED Final   Candida krusei NOT DETECTED NOT DETECTED Final   Candida parapsilosis NOT DETECTED NOT DETECTED Final   Candida tropicalis NOT DETECTED NOT DETECTED Final  Respiratory Panel by PCR     Status: None   Collection Time: 06/05/16 12:10 PM  Result Value Ref Range Status   Adenovirus NOT DETECTED NOT DETECTED Final   Coronavirus 229E NOT DETECTED NOT DETECTED Final   Coronavirus HKU1 NOT DETECTED NOT DETECTED Final   Coronavirus NL63 NOT DETECTED NOT DETECTED Final   Coronavirus OC43 NOT DETECTED NOT DETECTED Final   Metapneumovirus NOT DETECTED NOT DETECTED Final   Rhinovirus / Enterovirus NOT DETECTED NOT DETECTED Final   Influenza A NOT DETECTED  NOT DETECTED Final   Influenza B NOT DETECTED NOT DETECTED Final   Parainfluenza Virus 1 NOT DETECTED NOT DETECTED Final   Parainfluenza Virus 2 NOT DETECTED NOT DETECTED Final   Parainfluenza  Virus 3 NOT DETECTED NOT DETECTED Final   Parainfluenza Virus 4 NOT DETECTED NOT DETECTED Final   Respiratory Syncytial Virus NOT DETECTED NOT DETECTED Final   Bordetella pertussis NOT DETECTED NOT DETECTED Final   Chlamydophila pneumoniae NOT DETECTED NOT DETECTED Final   Mycoplasma pneumoniae NOT DETECTED NOT DETECTED Final  Culture, blood (routine x 2)     Status: None (Preliminary result)   Collection Time: 06/05/16  7:04 PM  Result Value Ref Range Status   Specimen Description BLOOD LEFT ANTECUBITAL  Final   Special Requests   Final    BOTTLES DRAWN AEROBIC AND ANAEROBIC Blood Culture results may not be optimal due to an excessive volume of blood received in culture bottles   Culture NO GROWTH < 24 HOURS  Final   Report Status PENDING  Incomplete  Culture, blood (routine x 2)     Status: None (Preliminary result)   Collection Time: 06/05/16  7:06 PM  Result Value Ref Range Status   Specimen Description BLOOD LEFT HAND  Final   Special Requests   Final    BOTTLES DRAWN AEROBIC ONLY Blood Culture results may not be optimal due to an excessive volume of blood received in culture bottles   Culture NO GROWTH < 24 HOURS  Final   Report Status PENDING  Incomplete         Radiology Studies: No results found.      Scheduled Meds: . apixaban  5 mg Oral BID  . atorvastatin  80 mg Oral QHS  . diclofenac sodium  2 g Topical QID  . multivitamin with minerals  1 tablet Oral Daily  . pantoprazole  40 mg Oral Daily  . potassium chloride  20 mEq Oral Daily  . sodium chloride flush  3 mL Intravenous Q12H  . sodium chloride flush  3 mL Intravenous Q12H  . tamsulosin  0.4 mg Oral QODAY   Continuous Infusions: . 0.9 % NaCl with KCl 40 mEq / L 75 mL/hr (06/07/16 1200)     LOS: 2 days     Caydyn Sprung Tanna Furry, MD [facility] Pager [phone]  If 7PM-7AM, please contact night-coverage [email or web address] Password [email or web address] 06/07/2016, 1:10 PM

## 2016-06-08 DIAGNOSIS — I639 Cerebral infarction, unspecified: Principal | ICD-10-CM

## 2016-06-08 DIAGNOSIS — R569 Unspecified convulsions: Secondary | ICD-10-CM

## 2016-06-08 DIAGNOSIS — I6521 Occlusion and stenosis of right carotid artery: Secondary | ICD-10-CM

## 2016-06-08 LAB — BASIC METABOLIC PANEL
Anion gap: 8 (ref 5–15)
BUN: 13 mg/dL (ref 6–20)
CO2: 21 mmol/L — ABNORMAL LOW (ref 22–32)
Calcium: 8.5 mg/dL — ABNORMAL LOW (ref 8.9–10.3)
Chloride: 100 mmol/L — ABNORMAL LOW (ref 101–111)
Creatinine, Ser: 0.82 mg/dL (ref 0.61–1.24)
GFR calc Af Amer: >60 mL/min (ref >60)
GFR calc non Af Amer: >60 mL/min (ref >60)
Glucose, Bld: 108 mg/dL — ABNORMAL HIGH (ref 65–99)
Potassium: 4.3 mmol/L (ref 3.5–5.1)
Sodium: 129 mmol/L — ABNORMAL LOW (ref 135–145)

## 2016-06-08 LAB — CBC
HCT: 34.6 % — ABNORMAL LOW (ref 39.0–52.0)
Hemoglobin: 11.4 g/dL — ABNORMAL LOW (ref 13.0–17.0)
MCH: 29.9 pg (ref 26.0–34.0)
MCHC: 32.9 g/dL (ref 30.0–36.0)
MCV: 90.8 fL (ref 78.0–100.0)
Platelets: 315 10*3/uL (ref 150–400)
RBC: 3.81 MIL/uL — ABNORMAL LOW (ref 4.22–5.81)
RDW: 15.5 % (ref 11.5–15.5)
WBC: 13.1 10*3/uL — ABNORMAL HIGH (ref 4.0–10.5)

## 2016-06-08 MED ORDER — LEVETIRACETAM 500 MG PO TABS
500.0000 mg | ORAL_TABLET | Freq: Two times a day (BID) | ORAL | Status: DC
  Administered 2016-06-08 – 2016-06-10 (×4): 500 mg via ORAL
  Filled 2016-06-08 (×4): qty 1

## 2016-06-08 NOTE — Progress Notes (Signed)
PROGRESS NOTE    Herbert Marquez  FIE:[id number] DOB: 1927/11/10 DOA: 06/04/2016 PCP: Lillard Anes, NP   Brief Narrative: 83 y.o. male with medical history significant for coronary artery disease with stent, history of CVA, GERD, and BPH who presents from his urologist's office for evaluation of acute change in mental status. Patient has history of BPH and UTIs and was in the urology clinic for evaluation  when he became acutely altered, noted to be agitated. His wife witnessed the episode and reports that it began with "drawing up of the right face" briefly, and that was followed by the agitation and generalized weakness.  Assessment & Plan:   #  Acute encephalopathy likely due to acute stroke -repeat MRI with stroke. Working with rehab.  -EEG with no epileptic foci -TSH, ammonia and b12 level unremarkable. -PT OT evaluation to community skilled nursing facility. Social worker was consulted. Inpatient rehabilitation was also consulted as per family's request. Mental status seems close to baseline this time.  # Acute stroke: repeat MRI showed 4 mm right hippocampal infarct and old left temporal lobe hemorrhage. Pt had CVA in January this year when received TPA.  -spoke with neurologist for the consult.  -MRI/MRA done -echo ordered -SLP eval -already working with PT/OT  -on statin and eliquis, further as per neurologist  #Staph epidermidis, micrococcus bacteremia: Likely contaminant. Initially treated with IV vancomycin until the final culture result (pt also with mild fever, leukocytosis and encephalopathy). Since the other culture bottle grew micrococcus it is most likely contaminant. I reviewed/consulted with Dr. Tommy Medal from [facility] 06/07/2016. He recommended to dc antibiotics. Follow up repeat culture results, negative so far.  -Influenza and respiratory viral panel negative -Continue supportive care.  #History of stroke: No acute finding., PT OT evaluation  #Right leg pain: X-ray of right  leg pain which was consistent with degenerative changes with no acute finding. Patient denied pain today. Continue rehabilitation.  #Paroxysmal atrial fibrillation: on eliquis, monitor heart rate. Elevated HR while walking with PT/OT today.  #History of coronary artery disease  #Hyponatremia: Monitor BMP, dc IVF.   DVT prophylaxis: Systemic anticoagulation Code Status: Full code Family Communication: No family at bedside.  Disposition Plan: Likely discharge to SNF in 1-2 days    Consultants:   Phone consult to ID  Neurologist  Procedures: EEG, CT MRI head, MRI Antimicrobials: None  Subjective: Patient was seen and examined at bedside. Working with physical therapist. Denied headache, dizziness, chest pain or SOB. ROS limited, has hard of hearing  Objective: Vitals:   06/07/16 1338 06/07/16 2104 06/08/16 0427 06/08/16 0945  BP: (!) 110/55 128/66 125/90   Pulse: 89 73 75 (!) 123  Resp: (!) 22 18 17    Temp: 98.8 F (37.1 C) 98.3 F (36.8 C) 97.4 F (36.3 C)   TempSrc: Axillary Oral Axillary   SpO2: 99% 96% 97%   Weight:   73.1 kg (161 lb 3.2 oz)   Height:        Intake/Output Summary (Last 24 hours) at 06/08/16 1059 Last data filed at 06/08/16 0753  Gross per 24 hour  Intake           1317.5 ml  Output              725 ml  Net            592.5 ml   Filed Weights   06/06/16 0459 06/07/16 0452 06/08/16 0427  Weight: 74.1 kg (163 lb 4.8 oz) 74.2 kg (  163 lb 9.6 oz) 73.1 kg (161 lb 3.2 oz)    Examination:  General exam: alert, awake and working with PT Respiratory system: clear bilateral. Respiratory effort normal. No wheezing or crackle Cardiovascular system: S1 & S2 heard, RRR.  No edema. Gastrointestinal system: Abdomen is nondistended, soft and nontender. Normal bowel sounds heard. Central nervous system: Alert awake and following commands. Skin: No rashes, lesions or ulcers Psychiatry: Judgement and insight appear impaired.    Data Reviewed: I have  personally reviewed following labs and imaging studies  CBC:  Recent Labs Lab 06/04/16 1630 06/04/16 2139 06/05/16 0137 06/07/16 0237 06/08/16 0212  WBC 8.2  --  11.2* 11.4* 13.1*  NEUTROABS 5.4  --  8.6*  --   --   HGB 12.6*  --  11.5* 11.5* 11.4*  HCT 37.6* 35.8* 34.3* 34.8* 34.6*  MCV 89.7  --  89.6 90.2 90.8  PLT 350  --  320 318 623   Basic Metabolic Panel:  Recent Labs Lab 06/04/16 1630 06/04/16 2001 06/05/16 0137 06/07/16 0237 06/08/16 0212  NA 134*  --  133* 131* 129*  K 3.7  --  3.7 3.4* 4.3  CL 101  --  103 102 100*  CO2 22  --  21* 20* 21*  GLUCOSE 98  --  106* 112* 108*  BUN 11  --  10 7 13   CREATININE 1.01  --  0.93 0.82 0.82  CALCIUM 9.2  --  8.7* 8.4* 8.5*  MG  --  1.6*  --  1.8  --    GFR: Estimated Creatinine Clearance: 60.5 mL/min (by C-G formula based on SCr of 0.82 mg/dL). Liver Function Tests:  Recent Labs Lab 06/04/16 1630  AST 40  ALT 17  ALKPHOS 77  BILITOT 0.9  PROT 6.8  ALBUMIN 3.6   No results for input(s): LIPASE, AMYLASE in the last 168 hours.  Recent Labs Lab 06/05/16 1450  AMMONIA 37*   Coagulation Profile: No results for input(s): INR, PROTIME in the last 168 hours. Cardiac Enzymes:  Recent Labs Lab 06/04/16 1630  TROPONINI <0.03   BNP (last 3 results) No results for input(s): PROBNP in the last 8760 hours. HbA1C: No results for input(s): HGBA1C in the last 72 hours. CBG:  Recent Labs Lab 06/04/16 1753 06/06/16 0800  GLUCAP 97 131*   Lipid Profile: No results for input(s): CHOL, HDL, LDLCALC, TRIG, CHOLHDL, LDLDIRECT in the last 72 hours. Thyroid Function Tests: No results for input(s): TSH, T4TOTAL, FREET4, T3FREE, THYROIDAB in the last 72 hours. Anemia Panel:  Recent Labs  06/05/16 1450  VITAMINB12 736   Sepsis Labs:  Recent Labs Lab 06/04/16 1657 06/05/16 0137 06/05/16 0340  LATICACIDVEN 2.60* 0.9 0.7    Recent Results (from the past 240 hour(s))  Blood Culture (routine x 2)      Status: Abnormal   Collection Time: 06/04/16  4:30 PM  Result Value Ref Range Status   Specimen Description BLOOD RIGHT ANTECUBITAL  Final   Special Requests   Final    BOTTLES DRAWN AEROBIC ONLY Blood Culture adequate volume   Culture  Setup Time   Final    GRAM POSITIVE COCCI IN CLUSTERS AEROBIC BOTTLE ONLY CRITICAL VALUE NOTED.  VALUE IS CONSISTENT WITH PREVIOUSLY REPORTED AND CALLED VALUE.    Culture (A)  Final    MICROCOCCUS LUTEUS/LYLAE Standardized susceptibility testing for this organism is not available.    Report Status 06/07/2016 FINAL  Final  Blood Culture (routine x 2)  Status: Abnormal   Collection Time: 06/04/16  4:35 PM  Result Value Ref Range Status   Specimen Description BLOOD LEFT FOREARM  Final   Special Requests   Final    BOTTLES DRAWN AEROBIC ONLY Blood Culture adequate volume   Culture  Setup Time   Final    GRAM POSITIVE COCCI IN CLUSTERS AEROBIC BOTTLE ONLY CRITICAL RESULT CALLED TO, READ BACK BY AND VERIFIED WITH: P. Dang Pharm.D. 16:30 06/05/16 ([email or web address])    Culture (A)  Final    STAPHYLOCOCCUS EPIDERMIDIS THE SIGNIFICANCE OF ISOLATING THIS ORGANISM FROM A SINGLE SET OF BLOOD CULTURES WHEN MULTIPLE SETS ARE DRAWN IS UNCERTAIN. PLEASE NOTIFY THE MICROBIOLOGY DEPARTMENT WITHIN ONE WEEK IF SPECIATION AND SENSITIVITIES ARE REQUIRED.    Report Status 06/07/2016 FINAL  Final  Blood Culture ID Panel (Reflexed)     Status: Abnormal   Collection Time: 06/04/16  4:35 PM  Result Value Ref Range Status   Enterococcus species NOT DETECTED NOT DETECTED Final   Listeria monocytogenes NOT DETECTED NOT DETECTED Final   Staphylococcus species DETECTED (A) NOT DETECTED Final    Comment: Methicillin (oxacillin) resistant coagulase negative staphylococcus. Possible blood culture contaminant (unless isolated from more than one blood culture draw or clinical case suggests pathogenicity). No antibiotic treatment is indicated for blood  culture contaminants. CRITICAL RESULT  CALLED TO, READ BACK BY AND VERIFIED WITH: P. Dang Pharm.D. 16:30 06/05/16 ([email or web address])    Staphylococcus aureus NOT DETECTED NOT DETECTED Final   Methicillin resistance DETECTED (A) NOT DETECTED Final    Comment: CRITICAL RESULT CALLED TO, READ BACK BY AND VERIFIED WITH: P. Dang Pharm.D. 16:30 06/05/16 ([email or web address])    Streptococcus species NOT DETECTED NOT DETECTED Final   Streptococcus agalactiae NOT DETECTED NOT DETECTED Final   Streptococcus pneumoniae NOT DETECTED NOT DETECTED Final   Streptococcus pyogenes NOT DETECTED NOT DETECTED Final   Acinetobacter baumannii NOT DETECTED NOT DETECTED Final   Enterobacteriaceae species NOT DETECTED NOT DETECTED Final   Enterobacter cloacae complex NOT DETECTED NOT DETECTED Final   Escherichia coli NOT DETECTED NOT DETECTED Final   Klebsiella oxytoca NOT DETECTED NOT DETECTED Final   Klebsiella pneumoniae NOT DETECTED NOT DETECTED Final   Proteus species NOT DETECTED NOT DETECTED Final   Serratia marcescens NOT DETECTED NOT DETECTED Final   Haemophilus influenzae NOT DETECTED NOT DETECTED Final   Neisseria meningitidis NOT DETECTED NOT DETECTED Final   Pseudomonas aeruginosa NOT DETECTED NOT DETECTED Final   Candida albicans NOT DETECTED NOT DETECTED Final   Candida glabrata NOT DETECTED NOT DETECTED Final   Candida krusei NOT DETECTED NOT DETECTED Final   Candida parapsilosis NOT DETECTED NOT DETECTED Final   Candida tropicalis NOT DETECTED NOT DETECTED Final  Respiratory Panel by PCR     Status: None   Collection Time: 06/05/16 12:10 PM  Result Value Ref Range Status   Adenovirus NOT DETECTED NOT DETECTED Final   Coronavirus 229E NOT DETECTED NOT DETECTED Final   Coronavirus HKU1 NOT DETECTED NOT DETECTED Final   Coronavirus NL63 NOT DETECTED NOT DETECTED Final   Coronavirus OC43 NOT DETECTED NOT DETECTED Final   Metapneumovirus NOT DETECTED NOT DETECTED Final   Rhinovirus / Enterovirus NOT DETECTED NOT DETECTED Final   Influenza A NOT DETECTED  NOT DETECTED Final   Influenza B NOT DETECTED NOT DETECTED Final   Parainfluenza Virus 1 NOT DETECTED NOT DETECTED Final   Parainfluenza Virus 2 NOT DETECTED NOT DETECTED Final   Parainfluenza Virus 3 NOT DETECTED NOT DETECTED Final  Parainfluenza Virus 4 NOT DETECTED NOT DETECTED Final   Respiratory Syncytial Virus NOT DETECTED NOT DETECTED Final   Bordetella pertussis NOT DETECTED NOT DETECTED Final   Chlamydophila pneumoniae NOT DETECTED NOT DETECTED Final   Mycoplasma pneumoniae NOT DETECTED NOT DETECTED Final  Culture, blood (routine x 2)     Status: None (Preliminary result)   Collection Time: 06/05/16  7:04 PM  Result Value Ref Range Status   Specimen Description BLOOD LEFT ANTECUBITAL  Final   Special Requests   Final    BOTTLES DRAWN AEROBIC AND ANAEROBIC Blood Culture results may not be optimal due to an excessive volume of blood received in culture bottles   Culture NO GROWTH 2 DAYS  Final   Report Status PENDING  Incomplete  Culture, blood (routine x 2)     Status: None (Preliminary result)   Collection Time: 06/05/16  7:06 PM  Result Value Ref Range Status   Specimen Description BLOOD LEFT HAND  Final   Special Requests   Final    BOTTLES DRAWN AEROBIC ONLY Blood Culture results may not be optimal due to an excessive volume of blood received in culture bottles   Culture NO GROWTH 2 DAYS  Final   Report Status PENDING  Incomplete         Radiology Studies: Mr Virgel Paling PF Contrast  Result Date: 06/07/2016 CLINICAL DATA:  Altered and agitated at urologist office today. Confusion. History of hypertension, hyperlipidemia, diabetes and stroke. EXAM: MRI HEAD WITHOUT CONTRAST MRA HEAD WITHOUT CONTRAST TECHNIQUE: Multiplanar, multiecho pulse sequences of the brain and surrounding structures were obtained without intravenous contrast. Angiographic images of the head were obtained using MRA technique without contrast. COMPARISON:  CT HEAD June 04, 2016 and MRI of the head June 04, 2016 in CTA HEAD March 31, 2016 FINDINGS: MRI HEAD FINDINGS BRAIN: 4 mm focus of reduced diffusion RIGHT hippocampus. Due to small size, limited assessment on ADC map. Susceptibility artifact LEFT temporal lobe at site of prior intraparenchymal hematoma. Ventricles and sulci are normal for patient's age. Confluent supratentorial white matter FLAIR T2 hyperintensities. Old LEFT thalamus lacunar infarct. No abnormal extra-axial fluid collections. 7 mm pineal cyst. VASCULAR: Normal major intracranial vascular flow voids present at skull base. SKULL AND UPPER CERVICAL SPINE: No abnormal sellar expansion. No suspicious calvarial bone marrow signal. Craniocervical junction maintained. SINUSES/ORBITS: The mastoid air-cells and included paranasal sinuses are well-aerated. The included ocular globes and orbital contents are non-suspicious. Status post bilateral ocular lens implants. OTHER: None. MRA HEAD FINDINGS ANTERIOR CIRCULATION: Flow related enhancement of the included cervical, petrous, cavernous and supraclinoid internal carotid arteries. RIGHT tonsillar loop. Mild stenosis bilateral supraclinoid internal carotid artery's. Patent anterior communicating artery. Normal flow related enhancement of the anterior and middle cerebral arteries, including distal segments. No large vessel occlusion, high-grade stenosis, abnormal luminal irregularity, aneurysm. POSTERIOR CIRCULATION: LEFT vertebral artery is dominant. Basilar artery is patent, with normal flow related enhancement of the main branch vessels. Normal flow related enhancement of the posterior cerebral arteries. No large vessel occlusion, high-grade stenosis, abnormal luminal irregularity, aneurysm. ANATOMIC VARIANTS: Hypoplastic LEFT A1 segment. IMPRESSION: MRI HEAD: 4 mm RIGHT hippocampal infarct is likely acute. Old LEFT temporal lobe hemorrhage. Moderate chronic small vessel ischemic disease. Old LEFT thalamus lacunar infarct. MRA HEAD: No emergent large  vessel occlusion or severe stenosis. Electronically Signed   By: Elon Alas M.D.   On: 06/07/2016 20:47   Mr Brain Wo Contrast  Result Date: 06/07/2016 CLINICAL DATA:  Altered and  agitated at urologist office today. Confusion. History of hypertension, hyperlipidemia, diabetes and stroke. EXAM: MRI HEAD WITHOUT CONTRAST MRA HEAD WITHOUT CONTRAST TECHNIQUE: Multiplanar, multiecho pulse sequences of the brain and surrounding structures were obtained without intravenous contrast. Angiographic images of the head were obtained using MRA technique without contrast. COMPARISON:  CT HEAD June 04, 2016 and MRI of the head June 04, 2016 in CTA HEAD March 31, 2016 FINDINGS: MRI HEAD FINDINGS BRAIN: 4 mm focus of reduced diffusion RIGHT hippocampus. Due to small size, limited assessment on ADC map. Susceptibility artifact LEFT temporal lobe at site of prior intraparenchymal hematoma. Ventricles and sulci are normal for patient's age. Confluent supratentorial white matter FLAIR T2 hyperintensities. Old LEFT thalamus lacunar infarct. No abnormal extra-axial fluid collections. 7 mm pineal cyst. VASCULAR: Normal major intracranial vascular flow voids present at skull base. SKULL AND UPPER CERVICAL SPINE: No abnormal sellar expansion. No suspicious calvarial bone marrow signal. Craniocervical junction maintained. SINUSES/ORBITS: The mastoid air-cells and included paranasal sinuses are well-aerated. The included ocular globes and orbital contents are non-suspicious. Status post bilateral ocular lens implants. OTHER: None. MRA HEAD FINDINGS ANTERIOR CIRCULATION: Flow related enhancement of the included cervical, petrous, cavernous and supraclinoid internal carotid arteries. RIGHT tonsillar loop. Mild stenosis bilateral supraclinoid internal carotid artery's. Patent anterior communicating artery. Normal flow related enhancement of the anterior and middle cerebral arteries, including distal segments. No large vessel  occlusion, high-grade stenosis, abnormal luminal irregularity, aneurysm. POSTERIOR CIRCULATION: LEFT vertebral artery is dominant. Basilar artery is patent, with normal flow related enhancement of the main branch vessels. Normal flow related enhancement of the posterior cerebral arteries. No large vessel occlusion, high-grade stenosis, abnormal luminal irregularity, aneurysm. ANATOMIC VARIANTS: Hypoplastic LEFT A1 segment. IMPRESSION: MRI HEAD: 4 mm RIGHT hippocampal infarct is likely acute. Old LEFT temporal lobe hemorrhage. Moderate chronic small vessel ischemic disease. Old LEFT thalamus lacunar infarct. MRA HEAD: No emergent large vessel occlusion or severe stenosis. Electronically Signed   By: Elon Alas M.D.   On: 06/07/2016 20:47        Scheduled Meds: . apixaban  5 mg Oral BID  . atorvastatin  80 mg Oral QHS  . diclofenac sodium  2 g Topical QID  . multivitamin with minerals  1 tablet Oral Daily  . pantoprazole  40 mg Oral Daily  . potassium chloride  20 mEq Oral Daily  . sodium chloride flush  3 mL Intravenous Q12H  . sodium chloride flush  3 mL Intravenous Q12H  . tamsulosin  0.4 mg Oral QODAY   Continuous Infusions:   LOS: 3 days    Nida Manfredi Tanna Furry, MD [facility] Pager [phone]  If 7PM-7AM, please contact night-coverage [email or web address] Password [email or web address] 06/08/2016, 10:59 AM

## 2016-06-08 NOTE — Care Management Note (Signed)
Case Management Note Marvetta Gibbons RN, BSN Unit 2W-Case Manager [phone]  Patient Details  Name: JAGJIT RINER MRN: [id number] Date of Birth: 06/21/1927  Subjective/Objective:  Pt admitted with acute encephalopathy- +BC                  Action/Plan: PTA pt lived at home with wife- per PT eval - recommendations for SNF- CSW consulted for placement needs.   Expected Discharge Date:                  Expected Discharge Plan:    In-House Referral:  Clinical Social Work  Discharge planning Services  CM Consult  Post Acute Care Choice:  IP Rehab Choice offered to:     DME Arranged:    DME Agency:     HH Arranged:    [place] Agency:     Status of Service:  In process, will continue to follow  If discussed at Long Length of Stay Meetings, dates discussed:    Discharge Disposition:   Additional Comments:  06/08/16- 1530- Marvetta Gibbons RN, CM- pt has now been found to have a new acute CVA- therapy updated recommendation to CIR, CIR consulted and per Genie with CIR appropriate for CIR admission when cleared medically- possible d/c to CIR over weekend. Genie and CIR to follow for admission.   Dawayne Patricia, RN 06/08/2016, 3:34 PM

## 2016-06-08 NOTE — PMR Pre-admission (Signed)
PMR Admission Coordinator Pre-Admission Assessment  Patient: Herbert Marquez is an 83 y.o., male MRN: [id number] DOB: [birth year] Height: 5\' 7"  (170.2 cm) Weight: 73.1 kg (161 lb 3.2 oz)              Insurance Information HMO: No   PPO:       PCP:       IPA:       80/20:       OTHER:   PRIMARY:  Medicare A/B      Policy#:  [id number]      Subscriber:  Barbarann Ehlers CM Name:        Phone#:       Fax#:   Pre-Cert#:        Employer:   Benefits:  Phone #:       Name:  Checked in [place]. Date: 08/04/94     Deduct:  $1340      Out of Pocket Max:  none      Life Max: unlimited CIR: 100%      SNF: 100 days Outpatient: 80%     Co-Pay: 20% Home Health: 100%      Co-Pay: none DME: 80%     Co-Pay: 20% Providers: patient's choice  SECONDARY:  UHC      Policy#:  [id number]      Subscriber:  Barbarann Ehlers CM Name:        Phone#:       Fax#:   Pre-Cert#:        Employer:  Retired Benefits:  Phone #: [phone]     Name:   Eff. Date:       Deduct:        Out of Pocket Max:        Life Max:   CIR:        SNF:   Outpatient:       Co-Pay:   Home Health:        Co-Pay:   DME:       Co-Pay:    Emergency Contact Information Contact Information    Name Relation Home Work Mobile   [employer] Spouse [phone]  [phone]   [place], Cihlar Daughter [phone]       Current Medical History  Patient Admitting Diagnosis:  New R CVA  History of Present Illness: An 83 y.o. 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 History taken from chart review and wife. He was making good progress at home and had completed HHPT. He was admitted on 06/04/16 via urology office with facial changes followed by mental status changes with agitation. MRI brain reviewed, but limited due to motion per report, secondary to severe agitation and  negative for large acute  infarct. EEG done revealing moderate diffuse slowing. UA negative. Blood cultures with Staph epidermidis thought to be contaminant, but started on IV Vanc due to fevers and encephalopathy.  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  Pt noted to be tachycardic and evaluated by Cardiology, who increased beta blocker and cleared for admission to inpatient rehab. PT/OT/SLP evaluations completed and therapies have been ongoing.  PT now recommending inpatient rehab.  Past Medical History  Past Medical History:  Diagnosis Date  . Arthritis    "pretty much all over"   . Basal cell carcinoma    "  several burned off his body, face, head"  . BPH (benign prostatic hypertrophy)   . Coronary artery disease    a. s/p PCI of RCA in 2006  . CVA (cerebral infarction)    a. 06/2015: left thalamic and bilateral PCA  . GERD (gastroesophageal reflux disease)   . Hyperlipidemia   . Hypertension   . TIA (transient ischemic attack)    Approximately 6 weeks post-cardiac catheterization.   . Type II diabetes mellitus ([place])    "prediabetic; lost alot of weight; not diabetic now" (06/16/2015)    Family History  family history includes Hypertension in his mother; Lung cancer in his brother and father.  Prior Rehab/Hospitalizations: Was on CIR 05/17.  Was at [facility] in the past year.  Recently on CIR as well.  Has the patient had major surgery during 100 days prior to admission? No  Current Medications   Current Facility-Administered Medications:  .  0.9 %  sodium chloride infusion, 250 mL, Intravenous, PRN, Ilene Qua Opyd, MD .  acetaminophen (TYLENOL) tablet 650 mg, 650 mg, Oral, Q6H PRN, 650 mg at 06/05/16 0837 **OR** acetaminophen (TYLENOL) suppository 650 mg, 650 mg, Rectal, Q6H PRN, Vianne Bulls, MD, 650 mg at 06/04/16 2207 .  apixaban (ELIQUIS) tablet 5 mg, 5 mg, Oral, BID, Ilene Qua Opyd, MD, 5 mg at 06/08/16 1002 .  atorvastatin (LIPITOR)  tablet 80 mg, 80 mg, Oral, QHS, Ilene Qua Opyd, MD, 80 mg at 06/07/16 2318 .  diclofenac sodium (VOLTAREN) 1 % transdermal gel 2 g, 2 g, Topical, QID, Dron Tanna Furry, MD, 2 g at 06/08/16 1001 .  LORazepam (ATIVAN) tablet 0.5 mg, 0.5 mg, Oral, Once PRN, Dron Tanna Furry, MD .  multivitamin with minerals tablet 1 tablet, 1 tablet, Oral, Daily, Vianne Bulls, MD, 1 tablet at 06/08/16 1002 .  ondansetron (ZOFRAN) tablet 4 mg, 4 mg, Oral, Q6H PRN **OR** ondansetron (ZOFRAN) injection 4 mg, 4 mg, Intravenous, Q6H PRN, Ilene Qua Opyd, MD .  oxyCODONE (Oxy IR/ROXICODONE) immediate release tablet 5 mg, 5 mg, Oral, Q6H PRN, Dron Tanna Furry, MD, 5 mg at 06/05/16 1756 .  pantoprazole (PROTONIX) EC tablet 40 mg, 40 mg, Oral, Daily, Ilene Qua Opyd, MD, 40 mg at 06/08/16 1002 .  polyvinyl alcohol (LIQUIFILM TEARS) 1.4 % ophthalmic solution 1 drop, 1 drop, Both Eyes, Daily PRN, Ilene Qua Opyd, MD .  potassium chloride 20 MEQ/15ML (10%) solution 20 mEq, 20 mEq, Oral, Daily, Dron Tanna Furry, MD, 20 mEq at 06/08/16 1002 .  sodium chloride flush (NS) 0.9 % injection 3 mL, 3 mL, Intravenous, Q12H, Ilene Qua Opyd, MD, 3 mL at 06/08/16 1000 .  sodium chloride flush (NS) 0.9 % injection 3 mL, 3 mL, Intravenous, Q12H, Ilene Qua Opyd, MD, 3 mL at 06/08/16 1000 .  sodium chloride flush (NS) 0.9 % injection 3 mL, 3 mL, Intravenous, PRN, Ilene Qua Opyd, MD .  tamsulosin (FLOMAX) capsule 0.4 mg, 0.4 mg, Oral, QODAY, Ilene Qua Opyd, MD, 0.4 mg at 06/07/16 4098  Patients Current Diet: DIET - DYS 1 Room service appropriate? Yes; Fluid consistency: Thin  Precautions / Restrictions Precautions Precautions: Fall Precaution Comments: family denies any falls in last 3 months Restrictions Weight Bearing Restrictions: No Other Position/Activity Restrictions: Pain in R knee (chronic injury), and R hip (rod placed in ~Oct 2017 per spouse).   Has the patient had 2 or more falls or a fall with injury in the past  year?Yes.  [place] out of chair and had  to have femur surgery in Oct/Nov 2017  Prior Activity Level Limited Community (1-2x/wk): Went out 2-3 X a week, was not driving.  Home Assistive Devices / Equipment Home Assistive Devices/Equipment: Environmental consultant (specify type), Wheelchair, Hearing aid, Grab bars in shower Home Equipment: Environmental consultant - 2 wheels, [employer] - single point, Wheelchair - manual, Systems analyst, Civil engineer, contracting, Grab bars - tub/shower  Prior Device Use: Indicate devices/aids used by the patient prior to current illness, exacerbation or injury? Manual wheelchair, Walker and seated 4 W walker  Prior Functional Level Prior Function Level of Independence: Independent with assistive device(s) Comments: mainly at w/c level at home but able to transfer to toilet and shower on his own. bathes and dresses with increased time; wife assists if they are in a hurry.  Self Care: Did the patient need help bathing, dressing, using the toilet or eating?  Needed some help  Indoor Mobility: Did the patient need assistance with walking from room to room (with or without device)? Needed some help  Stairs: Did the patient need assistance with internal or external stairs (with or without device)? Dependent  Functional Cognition: Did the patient need help planning regular tasks such as shopping or remembering to take medications? Needed some help  Current Functional Level Cognition  Overall Cognitive Status: Within Functional Limits for tasks assessed Orientation Level: Oriented to person, Oriented to place, Oriented to time, Oriented to situation General Comments: pt alert, following commands and disoriented to place and month    Extremity Assessment (includes Sensation/Coordination)  Upper Extremity Assessment: RUE deficits/detail RUE Deficits / Details: increased edema noted, able to wiggle fingers RUE: Unable to fully assess due to pain  Lower Extremity Assessment: Defer to PT evaluation    ADLs   Overall ADL's : Needs assistance/impaired Eating/Feeding: Minimal assistance, Sitting Grooming: Moderate assistance, Sitting Upper Body Bathing: Moderate assistance, Sitting Lower Body Bathing: Maximal assistance Upper Body Dressing : Minimal assistance, Sitting Lower Body Dressing: Maximal assistance Toilet Transfer: Moderate assistance, Stand-pivot, RW General ADL Comments: Pt declining transfers at this time; agreeable to sit EOB but reports he is in a lot of pain and tired.    Mobility  Overal bed mobility: Needs Assistance Bed Mobility: Sit to Sidelying Rolling: Supervision Supine to sit: Min assist Sit to supine: Max assist Sit to sidelying: Mod assist General bed mobility comments: VCs for sequencing of where to put and keep hands has he came down on his right side and then rolled over onto his back    Transfers  Overall transfer level: Needs assistance Equipment used: Rolling walker (2 wheeled) Transfers: Sit to/from Stand Sit to Stand: Max assist (from recliner) Stand pivot transfers:  (started out Mod A as he turned, then max A as he stepped backwards) General transfer comment: VCs for safe hand placement, increased time once in standing to get full upright posture (pt flexed and leaning to left)    Ambulation / Gait / Stairs / Wheelchair Mobility  Ambulation/Gait Ambulation/Gait assistance: Mod assist, +2 safety/equipment Ambulation Distance (Feet): 26 Feet Assistive device: Rolling walker (2 wheeled) Gait Pattern/deviations: Step-to pattern, Decreased stride length, Trunk flexed General Gait Details: pt with left trunk rotation, decreased stance on RLE, assist to control and advance RW, flexed trunk, cues for sequence and safety with chair to follow. Pt walked 26' then 22' with seated rest Gait velocity interpretation: Below normal speed for age/gender    Posture / Balance Dynamic Sitting Balance Sitting balance - Comments: Left lateral lean, can sit midline when  cued, but returns to Left Balance Overall balance assessment: Needs assistance Sitting-balance support: Feet supported, No upper extremity supported Sitting balance-Leahy Scale: Fair Sitting balance - Comments: Left lateral lean, can sit midline when cued, but returns to Left Postural control: Left lateral lean Standing balance support: Bilateral upper extremity supported Standing balance-Leahy Scale: Poor Standing balance comment: MOD assist plus cues for midline stand    Special needs/care consideration BiPAP/CPAP No CPM No Continuous Drip IV No Dialysis No        Life Vest No Oxygen No Special Bed No Trach Size No Wound Vac (area) No     Skin Right hand is swollen and red, thin skin, bruises easily, R knee swelling at times.                    Bowel mgmt: Had BM 06/08/16 per wife Bladder mgmt: Condom catheter in place Diabetic mgmt No    Previous Home Environment Living Arrangements: Spouse/significant other Available Help at Discharge: Family, Available 24 hours/day Type of Home: House Home Layout: One level Bathroom Shower/Tub: Multimedia programmer: Handicapped height Bathroom Accessibility: Yes How Accessible: Accessible via wheelchair [place]: Yes Type of Home Care Services: [place] (if known): advanced care Additional Comments: Spent a long time talking with pt and family about plans.  pt was home with [facility] services - he was walking with RW and wife following with wheelchair. He was able to transfer with wifes SBA only.  Discharge Living Setting Plans for Discharge Living Setting: Patient's home, House, Lives with (comment) (Lives with wife.) Type of Home at Discharge: House Discharge Home Layout: One level Discharge Home Access: Ramped entrance Does the patient have any problems obtaining your medications?: No  Social/Family/Support Systems Patient Roles: Spouse, Parent (Has a wife and a daughter.) Contact Information: Leman Martinek - wife Anticipated Caregiver: wife Anticipated Caregiver's Contact Information: [name] - wife - (h) [phone] and (c) [phone] Ability/Limitations of Caregiver: Wife can provide supervision and light assist as needed Caregiver Availability: 24/7 Discharge Plan Discussed with Primary Caregiver: Yes Is Caregiver In Agreement with Plan?: Yes Does Caregiver/Family have Issues with Lodging/Transportation while Pt is in Rehab?: No  Goals/Additional Needs Patient/Family Goal for Rehab: PT/OT/SLP supervision to min assist goals Expected length of stay: 15-18 days Cultural Considerations: None Dietary Needs: Dys 1, thin liquids Equipment Needs: TBD Pt/Family Agrees to Admission and willing to participate: Yes (Wife stays with patient most of the time.) Program Orientation Provided & Reviewed with Pt/Caregiver Including Roles  & Responsibilities: Yes  Decrease burden of Care through IP rehab admission: N/A  Possible need for SNF placement upon discharge: Not planned  Patient Condition: This patient's medical and functional status has changed since the consult dated: 06/06/16 in which the Rehabilitation Physician determined and documented that the patient's condition is appropriate for intensive rehabilitative care in an inpatient rehabilitation facility. See "History of Present Illness" (above) for medical update. Functional changes are: Currently requiring mod assist to ambulate 26 feet RW. Patient's medical and functional status update has been discussed with the Rehabilitation physician and patient remains appropriate for inpatient rehabilitation. Will admit to inpatient rehab.  Preadmission Screen Completed By:  Retta Diones, 06/08/2016 4:31 PM ______________________________________________________________________   Discussed status with Dr. Posey Pronto on 06/08/16 at 41 and received telephone approval for admission.  Admission Coordinator:  Retta Diones, time 1630/Date 06/08/16    Updated and discussed with case manager 06/10/16.

## 2016-06-08 NOTE — Progress Notes (Signed)
qPhysical Therapy Treatment Patient Details Name: Herbert Marquez MRN: [id number] DOB: 02/27/1928 Today's Date: 06/08/2016    History of Present Illness 83 y.o. male with medical history significant for coronary artery disease with stent, history of CVA, GERD, and BPH who presents from his urologist's office for evaluation of acute change in mental status. MRI on 4/2 negative for acute infarct.[name] MRI 4/5 with right hippocampal infarct    PT Comments    Pt very pleasant, alert, disoriented to place and time with excellent progression. Pt able to roll and transfer in bed with min assist as well as perform 2 different gait trials today. Pt with greatest difficulty rising from low chair but overall significantly improved from prior sessions. Pt with ataxia of RUE impairing ability with transfers but pt and wife report near his baseline status. CIR would be highly beneficial for pt given current status and new MRI. Will continue to follow.    Follow Up Recommendations  CIR;Supervision/Assistance - 24 hour     Equipment Recommendations  None recommended by PT    Recommendations for Other Services       Precautions / Restrictions Precautions Precautions: Fall Restrictions Weight Bearing Restrictions: No Other Position/Activity Restrictions: Pain in R knee (chronic injury), and R hip (rod placed in ~Oct 2017 per spouse).    Mobility  Bed Mobility Overal bed mobility: Needs Assistance Bed Mobility: Rolling Rolling: Supervision   Supine to sit: Min assist     General bed mobility comments: pt with increased time, use of rail and min assist to move RLE off of bed to sit  Transfers Overall transfer level: Needs assistance   Transfers: Sit to/from Stand Sit to Stand: Min assist;Max assist;+2 safety/equipment         General transfer comment: pt able to stand from EOB with cues and assist for rise with min assist. After gait standing from recliner but unable to complete with +1  assist and required +2 with max assist and increased time to steady. Pt with left trunk rotation and lean as well as LLE significantly anterior with transition to standing and required grossly 10 sec to balance and achieve fully upright with standing from chair  Ambulation/Gait Ambulation/Gait assistance: Mod assist;+2 safety/equipment Ambulation Distance (Feet): 26 Feet Assistive device: Rolling walker (2 wheeled) Gait Pattern/deviations: Step-to pattern;Decreased stride length;Trunk flexed   Gait velocity interpretation: Below normal speed for age/gender General Gait Details: pt with left trunk rotation, decreased stance on RLE, assist to control and advance RW, flexed trunk, cues for sequence and safety with chair to follow. Pt walked 80' then 22' with seated rest   Stairs            Wheelchair Mobility    Modified Rankin (Stroke Patients Only) Modified Rankin (Stroke Patients Only) Pre-Morbid Rankin Score: Moderate disability Modified Rankin: Moderately severe disability     Balance Overall balance assessment: Needs assistance   Sitting balance-Leahy Scale: Fair       Standing balance-Leahy Scale: Poor                              Cognition Arousal/Alertness: Awake/alert Behavior During Therapy: WFL for tasks assessed/performed Overall Cognitive Status: Impaired/Different from baseline Area of Impairment: Orientation                 Orientation Level: Time;Place             General Comments: pt alert, following commands and  disoriented to place and month      Exercises General Exercises - Lower Extremity Long Arc Quad: AROM;AAROM;Right;Left;Seated;10 reps (AAROM on RLE) Hip Flexion/Marching: AROM;Both;Seated;10 reps    General Comments        Pertinent Vitals/Pain Pain Assessment: No/denies pain    Home Living                      Prior Function            PT Goals (current goals can now be found in the care plan  section) Progress towards PT goals: Progressing toward goals    Frequency           PT Plan Discharge plan needs to be updated    Co-evaluation             End of Session Equipment Utilized During Treatment: Gait belt Activity Tolerance: Patient tolerated treatment well Patient left: in chair;with call bell/phone within reach;with family/visitor present;with chair alarm set Nurse Communication: Mobility status PT Visit Diagnosis: Unsteadiness on feet (R26.81);Difficulty in walking, not elsewhere classified (R26.2);Muscle weakness (generalized) (M62.81)     Time: 7858-8502 PT Time Calculation (min) (ACUTE ONLY): 25 min  Charges:  $Gait Training: 8-22 mins $Therapeutic Exercise: 8-22 mins                    G Codes:       Elwyn Reach, PT [phone]    [place] 06/08/2016, 9:53 AM

## 2016-06-08 NOTE — Progress Notes (Signed)
Neurology consult requested. Echo ordered.

## 2016-06-08 NOTE — Progress Notes (Signed)
Occupational Therapy Treatment Patient Details Name: Herbert Marquez MRN: [id number] DOB: [birth year] Today's Date: 06/08/2016    History of present illness 83 y.o. male with medical history significant for coronary artery disease with stent, history of CVA, GERD, and BPH who presents from his urologist's office for evaluation of acute change in mental status. MRI on 4/2 negative for acute infarct.[name] MRI 4/5 with right hippocampal infarct   OT comments  This 83 yo male admitted with above presents to acute OT with making progress with being able to transfer today. He will continue to benefit from acute OT with follow up OT at SNF.  Follow Up Recommendations  SNF;Supervision/Assistance - 24 hour    Equipment Recommendations  Other (comment) (TBD at next venue)       Precautions / Restrictions Precautions Precautions: Fall Restrictions Weight Bearing Restrictions: No Other Position/Activity Restrictions: Pain in R knee (chronic injury), and R hip (rod placed in ~Oct 2017 per spouse).       Mobility Bed Mobility Overal bed mobility: Needs Assistance Bed Mobility: Sit to Sidelying     Sit to sidelying: Mod assist General bed mobility comments: VCs for sequencing of where to put and keep hands has he came down on his right side and then rolled over onto his back  Transfers Overall transfer level: Needs assistance Equipment used: Rolling walker (2 wheeled) Transfers: Sit to/from Stand Sit to Stand: Max assist (from recliner) Stand pivot transfers:  (started out Mod A as he turned, then max A as he stepped backwards)       General transfer comment: VCs for safe hand placement, increased time once in standing to get full upright posture (pt flexed and leaning to left)    Balance Overall balance assessment: Needs assistance Sitting-balance support: Feet supported;No upper extremity supported Sitting balance-Leahy Scale: Fair     Standing balance support: Bilateral upper  extremity supported Standing balance-Leahy Scale: Poor Standing balance comment: MOD assist plus cues for midline stand                           ADL either performed or assessed with clinical judgement   ADL Overall ADL's : Needs assistance/impaired                         Toilet Transfer: Moderate assistance;Stand-pivot;RW                   Vision Patient Visual Report: No change from baseline            Cognition Arousal/Alertness: Awake/alert Behavior During Therapy: WFL for tasks assessed/performed Overall Cognitive Status: Within Functional Limits for tasks assessed                                               Pertinent Vitals/ Pain       Pain Assessment: No/denies pain         Frequency  Min 2X/week        Progress Toward Goals  OT Goals(current goals can now be found in the care plan section)  Progress towards OT goals: Progressing toward goals     Plan Discharge plan remains appropriate       End of Session Equipment Utilized During Treatment: Gait belt;Rolling walker  OT Visit Diagnosis: Unsteadiness on feet (  R26.81);Muscle weakness (generalized) (M62.81)   Activity Tolerance Patient limited by fatigue   Patient Left in bed;with call bell/phone within reach;with bed alarm set;with family/visitor present   Nurse Communication  (NT: pt back in bed)        Time: 1540-0867 OT Time Calculation (min): 14 min  Charges: OT General Charges $OT Visit: 1 Procedure OT Treatments $Self Care/Home Management : 8-22 mins  [place], OTR/L [phone] 06/08/2016

## 2016-06-08 NOTE — Progress Notes (Signed)
Rehab admissions - I met with patient and his wife.  Patient known to me from recent stays on CIR.  Patient now with a new CVA.  Noted PT recommending CIR.  Wife would like to admit to CIR once patient is medically cleared.  I spoke with attending MD.  I will plan to admit tomorrow, Saturday, if medical workup is completed.  Can also potentially admit on Sunday if not ready tomorrow.  I do have beds open on inpatient rehab tomorrow and Sunday.  Call me for questions.  #[phone]

## 2016-06-08 NOTE — Consult Note (Signed)
Referring Physician:  Dr Carolin Sicks    Chief Complaint: Confusion / Agitation  HPI: Herbert Marquez is an 83 y.o. male with a history of CAD s/p stent, DM, Htn, Hld, TIA, previous CVA, S/P cerebral hemorrhage / SDH after TPA for an acute stroke on 03/30/2016,  PAF on Eliquis prior to this admission, GERD, bilateral carotid artery stenosis, BPH and UTIs who presented to his urologist's office and was noted to be confused and agitation. The patient's wife reports his right face was "drawing". 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  Leukocytosis 13.1 (afebrile) and Na 129. Ammonia - 37 (H).  The patient is alert and oriented today; however, he was unable to give any details regarding this most recent admission. The patient's wife is at the bedside and she provides the history. Apparently they went to see his urologist on 06/04/2016. During that visit the patient suddenly became confused and agitated. An ambulance was called and the patient was brought to the hospital. He was admitted for further evaluation. The patient's wife reports that he seems to be confused in the early mornings since admission but clears during the day. He had right-sided weakness following his previous stroke although this has improved somewhat. He was getting PT and OT on an outpatient basis following his discharge from the rehabilitation unit on 04/14/2016. The patient's wife states that he has been compliant with his Eliquis.  Date last known well: Date: 06/04/2016 Time last known well: Unable to determine tPA Given: No: Recent stroke  Past Medical History:  Diagnosis Date  . Arthritis    "pretty much all over"   . Basal cell carcinoma    "several burned off his body, face, head"  . BPH (benign prostatic hypertrophy)   . Coronary artery disease    a. s/p PCI of RCA in 2006  . CVA (cerebral infarction)    a. 06/2015: left thalamic and bilateral PCA  . GERD (gastroesophageal reflux disease)   .  Hyperlipidemia   . Hypertension   . TIA (transient ischemic attack)    Approximately 6 weeks post-cardiac catheterization.   . Type II diabetes mellitus ([place])    "prediabetic; lost alot of weight; not diabetic now" (06/16/2015)    Past Surgical History:  Procedure Laterality Date  . CARDIOVASCULAR STRESS TEST  07/01/2007   EF 74%  . CATARACT EXTRACTION, BILATERAL    . CORONARY ANGIOPLASTY WITH STENT PLACEMENT  10/2004   stenting x 2 to RCA  . FEMUR IM NAIL Right 11/26/2015   Procedure: INTRAMEDULLARY RIGHT (IM) NAIL FEMORAL;  Surgeon: Rod Can, MD;  Location: [facility];  Service: Orthopedics;  Laterality: Right;  . HERNIA REPAIR    . HIP ARTHROPLASTY  03/09/2011   Procedure: ARTHROPLASTY BIPOLAR HIP;  Surgeon: Mauri Pole;  Location: [facility];  Service: Orthopedics;  Laterality: Left;  . LAPAROSCOPIC INCISIONAL / UMBILICAL / VENTRAL HERNIA REPAIR     "below his naval"    Family History  Problem Relation Age of Onset  . Hypertension Mother   . Lung cancer Father   . Lung cancer Brother    Social History:  reports that he quit smoking about 45 years ago. He has never used smokeless tobacco. He reports that he drinks about 4.8 oz of alcohol per week . He reports that he does not use drugs.  Allergies: No Known Allergies  Medications:  Scheduled: . apixaban  5 mg Oral BID  . atorvastatin  80 mg Oral QHS  .  diclofenac sodium  2 g Topical QID  . multivitamin with minerals  1 tablet Oral Daily  . pantoprazole  40 mg Oral Daily  . potassium chloride  20 mEq Oral Daily  . sodium chloride flush  3 mL Intravenous Q12H  . sodium chloride flush  3 mL Intravenous Q12H  . tamsulosin  0.4 mg Oral QODAY    ROS: History obtained from the patient and his wife  General ROS: negative for - chills, fatigue, fever, night sweats, weight gain or weight loss Psychological ROS: negative for - behavioral disorder, hallucinations, memory difficulties, mood swings or suicidal ideation Ophthalmic  ROS: negative for - blurry vision, double vision, eye pain or loss of vision ENT ROS: negative for - epistaxis, nasal discharge, oral lesions, sore throat, tinnitus or vertigo Allergy and Immunology ROS: negative for - hives or itchy/watery eyes Hematological and Lymphatic ROS: negative for - bleeding problems, bruising or swollen lymph nodes Endocrine ROS: negative for - galactorrhea, hair pattern changes, polydipsia/polyuria or temperature intolerance Respiratory ROS: negative for - cough, hemoptysis, shortness of breath or wheezing Cardiovascular ROS: negative for - chest pain, dyspnea on exertion, edema or irregular heartbeat Gastrointestinal ROS: Positive for recent constipation with increased gas pains. Genito-Urinary ROS: negative for - dysuria, hematuria, incontinence or urinary frequency/urgency Musculoskeletal ROS: Positive for generalized arthritis pain. Neurological ROS: as noted in HPI Dermatological ROS: negative for rash and skin lesion changes   Physical Examination: Blood pressure 125/90, pulse 75, temperature 97.4 F (36.3 C), temperature source Axillary, resp. rate 17, height 5\' 7"  (1.702 m), weight 73.1 kg (161 lb 3.2 oz), SpO2 97 %.  General - Pleasant 83 yo male in NAD. Heart - Regular rate and rhythm - distant Lungs - decreased breath sounds with basilar crackles. Abdomen - Soft - non tender Extremities - Distal pulses intact - no edema Skin - Warm and dry  NEUROLOGIC:   MENTAL STATUS: awake, alert, oriented, language fluent,  follows simple commands.  CRANIAL NERVES: pupils equal and pinpoint,extraocular motions, visual fields grossly intact intact, facial sensation and strength symmetric, uvula midlinec, tongue midline MOTOR: normal bulk and tone, Strength - RUE 4/5   ;  RLE 1/5 proximally   ;   LUE 5/5   ;   LLE - 5/5 SENSORY: normal and symmetric to light touch  CEREBELLAR: Some ataxia with finger to nose testing with the right upper extremity. Unable to test  lower extremities. REFLEXES: deep tendon reflexes diminished in the lower extremities, present and symmetric - no babinski GAIT: Deferred at this time    Laboratory Studies:  Basic Metabolic Panel:  Recent Labs Lab 06/04/16 1630 06/04/16 2001 06/05/16 0137 06/07/16 0237 06/08/16 0212  NA 134*  --  133* 131* 129*  K 3.7  --  3.7 3.4* 4.3  CL 101  --  103 102 100*  CO2 22  --  21* 20* 21*  GLUCOSE 98  --  106* 112* 108*  BUN 11  --  10 7 13   CREATININE 1.01  --  0.93 0.82 0.82  CALCIUM 9.2  --  8.7* 8.4* 8.5*  MG  --  1.6*  --  1.8  --     Liver Function Tests:  Recent Labs Lab 06/04/16 1630  AST 40  ALT 17  ALKPHOS 77  BILITOT 0.9  PROT 6.8  ALBUMIN 3.6   No results for input(s): LIPASE, AMYLASE in the last 168 hours.  Recent Labs Lab 06/05/16 1450  AMMONIA 37*  CBC:  Recent Labs Lab 06/04/16 1630 06/04/16 2139 06/05/16 0137 06/07/16 0237 06/08/16 0212  WBC 8.2  --  11.2* 11.4* 13.1*  NEUTROABS 5.4  --  8.6*  --   --   HGB 12.6*  --  11.5* 11.5* 11.4*  HCT 37.6* 35.8* 34.3* 34.8* 34.6*  MCV 89.7  --  89.6 90.2 90.8  PLT 350  --  320 318 315    Cardiac Enzymes:  Recent Labs Lab 06/04/16 1630  TROPONINI <0.03    BNP: Invalid input(s): POCBNP  CBG:  Recent Labs Lab 06/04/16 1753 06/06/16 0800  GLUCAP 12 131*    Microbiology: Results for orders placed or performed during the hospital encounter of 06/04/16  Blood Culture (routine x 2)     Status: Abnormal   Collection Time: 06/04/16  4:30 PM  Result Value Ref Range Status   Specimen Description BLOOD RIGHT ANTECUBITAL  Final   Special Requests   Final    BOTTLES DRAWN AEROBIC ONLY Blood Culture adequate volume   Culture  Setup Time   Final    GRAM POSITIVE COCCI IN CLUSTERS AEROBIC BOTTLE ONLY CRITICAL VALUE NOTED.  VALUE IS CONSISTENT WITH PREVIOUSLY REPORTED AND CALLED VALUE.    Culture (A)  Final    MICROCOCCUS LUTEUS/LYLAE Standardized susceptibility testing for this  organism is not available.    Report Status 06/07/2016 FINAL  Final  Blood Culture (routine x 2)     Status: Abnormal   Collection Time: 06/04/16  4:35 PM  Result Value Ref Range Status   Specimen Description BLOOD LEFT FOREARM  Final   Special Requests   Final    BOTTLES DRAWN AEROBIC ONLY Blood Culture adequate volume   Culture  Setup Time   Final    GRAM POSITIVE COCCI IN CLUSTERS AEROBIC BOTTLE ONLY CRITICAL RESULT CALLED TO, READ BACK BY AND VERIFIED WITH: P. Dang Pharm.D. 16:30 06/05/16 ([email or web address])    Culture (A)  Final    STAPHYLOCOCCUS EPIDERMIDIS THE SIGNIFICANCE OF ISOLATING THIS ORGANISM FROM A SINGLE SET OF BLOOD CULTURES WHEN MULTIPLE SETS ARE DRAWN IS UNCERTAIN. PLEASE NOTIFY THE MICROBIOLOGY DEPARTMENT WITHIN ONE WEEK IF SPECIATION AND SENSITIVITIES ARE REQUIRED.    Report Status 06/07/2016 FINAL  Final  Blood Culture ID Panel (Reflexed)     Status: Abnormal   Collection Time: 06/04/16  4:35 PM  Result Value Ref Range Status   Enterococcus species NOT DETECTED NOT DETECTED Final   Listeria monocytogenes NOT DETECTED NOT DETECTED Final   Staphylococcus species DETECTED (A) NOT DETECTED Final    Comment: Methicillin (oxacillin) resistant coagulase negative staphylococcus. Possible blood culture contaminant (unless isolated from more than one blood culture draw or clinical case suggests pathogenicity). No antibiotic treatment is indicated for blood  culture contaminants. CRITICAL RESULT CALLED TO, READ BACK BY AND VERIFIED WITH: P. Dang Pharm.D. 16:30 06/05/16 ([email or web address])    Staphylococcus aureus NOT DETECTED NOT DETECTED Final   Methicillin resistance DETECTED (A) NOT DETECTED Final    Comment: CRITICAL RESULT CALLED TO, READ BACK BY AND VERIFIED WITH: P. Dang Pharm.D. 16:30 06/05/16 ([email or web address])    Streptococcus species NOT DETECTED NOT DETECTED Final   Streptococcus agalactiae NOT DETECTED NOT DETECTED Final   Streptococcus pneumoniae NOT DETECTED NOT DETECTED Final    Streptococcus pyogenes NOT DETECTED NOT DETECTED Final   Acinetobacter baumannii NOT DETECTED NOT DETECTED Final   Enterobacteriaceae species NOT DETECTED NOT DETECTED Final   Enterobacter cloacae complex NOT DETECTED NOT DETECTED Final   Escherichia coli NOT  DETECTED NOT DETECTED Final   Klebsiella oxytoca NOT DETECTED NOT DETECTED Final   Klebsiella pneumoniae NOT DETECTED NOT DETECTED Final   Proteus species NOT DETECTED NOT DETECTED Final   Serratia marcescens NOT DETECTED NOT DETECTED Final   Haemophilus influenzae NOT DETECTED NOT DETECTED Final   Neisseria meningitidis NOT DETECTED NOT DETECTED Final   Pseudomonas aeruginosa NOT DETECTED NOT DETECTED Final   Candida albicans NOT DETECTED NOT DETECTED Final   Candida glabrata NOT DETECTED NOT DETECTED Final   Candida krusei NOT DETECTED NOT DETECTED Final   Candida parapsilosis NOT DETECTED NOT DETECTED Final   Candida tropicalis NOT DETECTED NOT DETECTED Final  Respiratory Panel by PCR     Status: None   Collection Time: 06/05/16 12:10 PM  Result Value Ref Range Status   Adenovirus NOT DETECTED NOT DETECTED Final   Coronavirus 229E NOT DETECTED NOT DETECTED Final   Coronavirus HKU1 NOT DETECTED NOT DETECTED Final   Coronavirus NL63 NOT DETECTED NOT DETECTED Final   Coronavirus OC43 NOT DETECTED NOT DETECTED Final   Metapneumovirus NOT DETECTED NOT DETECTED Final   Rhinovirus / Enterovirus NOT DETECTED NOT DETECTED Final   Influenza A NOT DETECTED NOT DETECTED Final   Influenza B NOT DETECTED NOT DETECTED Final   Parainfluenza Virus 1 NOT DETECTED NOT DETECTED Final   Parainfluenza Virus 2 NOT DETECTED NOT DETECTED Final   Parainfluenza Virus 3 NOT DETECTED NOT DETECTED Final   Parainfluenza Virus 4 NOT DETECTED NOT DETECTED Final   Respiratory Syncytial Virus NOT DETECTED NOT DETECTED Final   Bordetella pertussis NOT DETECTED NOT DETECTED Final   Chlamydophila pneumoniae NOT DETECTED NOT DETECTED Final   Mycoplasma  pneumoniae NOT DETECTED NOT DETECTED Final  Culture, blood (routine x 2)     Status: None (Preliminary result)   Collection Time: 06/05/16  7:04 PM  Result Value Ref Range Status   Specimen Description BLOOD LEFT ANTECUBITAL  Final   Special Requests   Final    BOTTLES DRAWN AEROBIC AND ANAEROBIC Blood Culture results may not be optimal due to an excessive volume of blood received in culture bottles   Culture NO GROWTH 2 DAYS  Final   Report Status PENDING  Incomplete  Culture, blood (routine x 2)     Status: None (Preliminary result)   Collection Time: 06/05/16  7:06 PM  Result Value Ref Range Status   Specimen Description BLOOD LEFT HAND  Final   Special Requests   Final    BOTTLES DRAWN AEROBIC ONLY Blood Culture results may not be optimal due to an excessive volume of blood received in culture bottles   Culture NO GROWTH 2 DAYS  Final   Report Status PENDING  Incomplete    Coagulation Studies: No results for input(s): LABPROT, INR in the last 72 hours.  Urinalysis:  Recent Labs Lab 06/04/16 1618  COLORURINE YELLOW  LABSPEC 1.009  PHURINE 8.0  GLUCOSEU NEGATIVE  HGBUR NEGATIVE  BILIRUBINUR NEGATIVE  KETONESUR 5*  PROTEINUR NEGATIVE  NITRITE NEGATIVE  LEUKOCYTESUR NEGATIVE    Lipid Panel:    Component Value Date/Time   CHOL 77 03/31/2016 0353   TRIG 37 03/31/2016 0353   HDL 32 (L) 03/31/2016 0353   CHOLHDL 2.4 03/31/2016 0353   VLDL 7 03/31/2016 0353   LDLCALC 38 03/31/2016 0353    HgbA1C:  Lab Results  Component Value Date   HGBA1C 5.0 03/31/2016    Urine Drug Screen:     Component Value Date/Time   LABOPIA NONE  DETECTED 06/16/2015 2037   COCAINSCRNUR NONE DETECTED 06/16/2015 2037   LABBENZ NONE DETECTED 06/16/2015 2037   AMPHETMU NONE DETECTED 06/16/2015 2037   THCU NONE DETECTED 06/16/2015 2037   LABBARB NONE DETECTED 06/16/2015 2037    Alcohol Level: No results for input(s): ETH in the last 168 hours.  Other results: EKG: SR rate 77 BPM.   (Please see cardiology reading)  Imaging:   Mr Virgel Paling Wo Contrast 06/07/2016  MRI HEAD:  4 mm RIGHT hippocampal infarct is likely acute.  Old LEFT temporal lobe hemorrhage.  Moderate chronic small vessel ischemic disease.  Old LEFT thalamus lacunar infarct.   MRA HEAD:  No emergent large vessel occlusion or severe stenosis.    TTE -> pending 06/08/2016   Previous Admission  MRI Head  03/31/2016 Limited examination. Marked motion degrades image quality. No acute abnormality. But able to tell there is left temporal acute infarct with hemorrhagic transformation.    CTA Head and Neck 03/31/1016 1. Left temporal lobe hemorrhagic infarct. The parenchymal hematoma measures 4 cc volume. Subdural extension in the middle cranial fossa without significant mass effect. 2. No emergent large vessel occlusion. 3. Atherosclerosis with 60-70% proximal right ICA and 40-50% proximal left ICA stenosis. Mild moderate right supraclinoid ICA stenosis. No significant stenosis in the posterior circulation. 4. Pulmonary fibrosis.   TTE  11/28/2015 - Left ventricle: The cavity size was normal. Wall thickness was increased in a pattern of mild LVH. Systolic function was vigorous. The estimated ejection fraction was in the range of 65% to 70%. - Right ventricle: The cavity size was mildly dilated.     Assessment: 83 y.o. male with history of CAD s/p stent, DM, Htn, Hld, TIA, previous CVA, S/P cerebral hemorrhage / SDH after TPA for an acute stroke on 03/30/2016, PAF on Eliquis prior to this most recent admission, GERD, bilateral carotid artery stenosis, BPH and UTIs admitted with confusion, agitation and facial droop.   Stroke Risk Factors - CAD, DM, Htn, Hld, TIA, previous CVA, and PAF on Eliquis.  Plan:  HgbA1c, fasting lipid panel  PT consult, OT consult, Speech consult  Echocardiogram  Prophylactic therapy- currently on Eliquis.  Telemetry monitoring  Frequent neuro  checks   Dr Shon Hale to see for further impression and recommendations.    Mikey Bussing PA-C [facility] Pager [phone] 06/08/2016, 10:07 AM  Neurology Attending Addendum  This patient was seen, examined, and d/w PA. I have reviewed the note and agree with the findings, assessment and plan as documented with the following additions.   In brief, this is an 83 year old right-handed man with recent history of left MCA territory infarct status post tPA administration in January 2018. His course was complicated by some hemorrhagic transformation of his stroke with hemorrhage involving the left temporal lobe. He has a residual right hemiparesis that has improved with rehabilitation and he has since been discharged from rehabilitation services. He is now admitted after a witnessed episode that occurred at his urologist's office on 06/04/16. History is obtained mainly from the patient's wife and daughter who were present at the bedside as he has little recollection of the event.  The patient's wife was with him at that visit. She says that all of a sudden he had drooping of the right side of his mouth. This was followed by decreased responsiveness with shaking of his body. It is unclear how long this lasted but he then became agitated and confused. The office staff called 911 and the patient  was transported to the [facility] emergency department. He has not had any further spells since the day of presentation. His mental status has slowly improved. However, family reports that he does have episodes of confusion in the morning. This morning, he was convinced that he was staying in a rental house and adamantly denied being in the hospital. This was transient and since then he has been much more alert and oriented. He had an EEG earlier in his admission that showed no epileptiform abnormality. MRI scan of the brain was obtained and showed a punctate area of acute ischemia involving the right  hippocampus. Neurology consultation requested.  His wife reports that she has not appreciated any other episodes of seizure-like activity. She states he was recently diagnosed with a urinary tract infection and started on antibiotics. However, she says that the antibiotics were changed after two days. She showed me a sheet of paper or should written down the names of the medications he was given. This first was cefuroxime but she says his doctor's office called to switch him to a different antibiotic and she has citalopram listed as the second agent used. No other antibiotic as listed on the medication sheet she provided.  Exam: Agree with PA assessment. He is initially sleeping very soundly. He sleeps with his mouth wide open, snoring loudly. He aroused to tactile stimulation and voice. He is hard of hearing. Once awake, he was oriented to self and [facility]. Speech is dysarthric but makes memories are very dry. No obvious aphasia. He has a clear right hemiparesis with the leg weaker than the arm. No abnormal movements are seen.  Imaging: I have personally and independently reviewed the MRI scan of the brain without contrast from 06/07/16. This shows a punctate area of restricted diffusion in the right hippocampus consistent with acute ischemic infarction. There is some encephalomalacia with increased susceptibility artifact in the left temporal lobe consistent with recent intraparenchymal hematoma. Moderate chronic small vessel ischemic disease is present. There is an old lacunar infarction involving the left thalamus.  I have personally and independently reviewed the MRA of the head without contrast from 06/07/16. This shows no large vessel occlusion or significant stenosis. Mild atherosclerotic changes are noted.  CT angiogram of the neck from 03/31/16 showed atherosclerotic changes with 60-70% stenosis of the proximal right internal carotid artery and 40-50% stenosis of the proximal left internal  carotid artery.  Diagnostic studies: EEG from 06/05/16 showed moderate diffuse generalized slowing without epileptiform abnormalities or seizures.  TTE pending  Pertinent labs: BMP notable for sodium 129, chloride 100, CO2 21, glucose 108 CBC notable for white blood cell count 13.1, hemoglobin 11.4, hematocrit 34.6 Magnesium 1.8 Blood cultures with no growth at 3 days Vitamin B12 736 Serum ammonia 37 HIV nonreactive RBC folate 1598 TSH 3.437 Urinalysis on 06/04/16 notable only for ketones 5 mg/dL LFTs normal  Impression: 1. Acute ischemic stroke, right MCA territory 2. Probable seizure 3. Right internal carotid artery stenosis 4. Acute encephalopathy 5. Right hemiparesis, chronic 6. Dysarthria  Recommendations: As per PA note. While his stroke certainly could be related to his underlying atrial fibrillation, he also has some carotid stenosis on the right side which could have resulted in a small embolic infarction. At this point, continue Eliquis. I'm not sure that he is a candidate for intervention on this carotid artery but would consider vascular surgery consultation. Continue medical management for now.. Continue risk factor modification. Await TTE results.  I suspect the episode he  had that led him to the ER was actually a seizure with postictal agitation. His recent left temporal lobe infarction and hemorrhage would certainly place him at risk for seizures. Even though EEG was unremarkable, I would recommend initiation of antiepileptic therapy. I discussed this with his family. After discussing potential risks and benefits, including most common side effects, they have elected to go with a trial of Keppra. I will start Keppra 500 mg twice daily and monitor. Seizure precautions.  Thank you for this consultation. Neurology will continue to follow. Please call with any urgent questions or concerns.

## 2016-06-08 NOTE — Care Management Important Message (Signed)
Important Message  Patient Details  Name: NELLIE CHEVALIER MRN: [id number] Date of Birth: 1927-04-23   Medicare Important Message Given:  Yes    Cleotilde Spadaccini 06/08/2016, 2:19 PM

## 2016-06-09 ENCOUNTER — Inpatient Hospital Stay (HOSPITAL_COMMUNITY): Payer: Medicare Other

## 2016-06-09 DIAGNOSIS — I6789 Other cerebrovascular disease: Secondary | ICD-10-CM

## 2016-06-09 LAB — BASIC METABOLIC PANEL
Anion gap: 8 (ref 5–15)
BUN: 13 mg/dL (ref 6–20)
CO2: 21 mmol/L — ABNORMAL LOW (ref 22–32)
Calcium: 8.5 mg/dL — ABNORMAL LOW (ref 8.9–10.3)
Chloride: 101 mmol/L (ref 101–111)
Creatinine, Ser: 0.86 mg/dL (ref 0.61–1.24)
GFR calc Af Amer: >60 mL/min (ref >60)
GFR calc non Af Amer: >60 mL/min (ref >60)
Glucose, Bld: 111 mg/dL — ABNORMAL HIGH (ref 65–99)
Potassium: 4 mmol/L (ref 3.5–5.1)
Sodium: 130 mmol/L — ABNORMAL LOW (ref 135–145)

## 2016-06-09 LAB — CBC
HCT: 33.5 % — ABNORMAL LOW (ref 39.0–52.0)
Hemoglobin: 10.8 g/dL — ABNORMAL LOW (ref 13.0–17.0)
MCH: 29.3 pg (ref 26.0–34.0)
MCHC: 32.2 g/dL (ref 30.0–36.0)
MCV: 90.8 fL (ref 78.0–100.0)
Platelets: 369 10*3/uL (ref 150–400)
RBC: 3.69 MIL/uL — ABNORMAL LOW (ref 4.22–5.81)
RDW: 15.3 % (ref 11.5–15.5)
WBC: 10.7 10*3/uL — ABNORMAL HIGH (ref 4.0–10.5)

## 2016-06-09 LAB — ECHOCARDIOGRAM COMPLETE
Height: 67 in
Weight: 2604.8 oz

## 2016-06-09 MED ORDER — METOPROLOL TARTRATE 12.5 MG HALF TABLET
12.5000 mg | ORAL_TABLET | Freq: Two times a day (BID) | ORAL | Status: DC
  Filled 2016-06-09: qty 1

## 2016-06-09 MED ORDER — METOPROLOL TARTRATE 25 MG PO TABS
25.0000 mg | ORAL_TABLET | Freq: Two times a day (BID) | ORAL | Status: DC
  Administered 2016-06-09 – 2016-06-10 (×2): 25 mg via ORAL
  Filled 2016-06-09 (×2): qty 1

## 2016-06-09 NOTE — Progress Notes (Signed)
Neurology Progress Note  Subjective: The patient has no episode of asymptomatic tachycardia overnight resulting in stat EKG. This reportedly showed normal sinus rhythm with first-degree heart block and occasional PVCs. No interventions provided. He slept well otherwise. This morning, he has no complaints on a 12 point review of systems. His wife is at the bedside and reports that he seems to be less confused this morning than he has been over the past few days.  Medications reviewed and reconciled.   Pertinent meds: Eliquis 5 mg twice a day Atorvastatin 80 mg daily Keppra 500 mg twice daily  Current Meds:   Current Facility-Administered Medications:  .  0.9 %  sodium chloride infusion, 250 mL, Intravenous, PRN, Ilene Qua Opyd, MD .  acetaminophen (TYLENOL) tablet 650 mg, 650 mg, Oral, Q6H PRN, 650 mg at 06/05/16 0837 **OR** acetaminophen (TYLENOL) suppository 650 mg, 650 mg, Rectal, Q6H PRN, Vianne Bulls, MD, 650 mg at 06/04/16 2207 .  apixaban (ELIQUIS) tablet 5 mg, 5 mg, Oral, BID, Ilene Qua Opyd, MD, 5 mg at 06/09/16 0940 .  atorvastatin (LIPITOR) tablet 80 mg, 80 mg, Oral, QHS, Ilene Qua Opyd, MD, 80 mg at 06/08/16 2152 .  diclofenac sodium (VOLTAREN) 1 % transdermal gel 2 g, 2 g, Topical, QID, Dron Tanna Furry, MD, 2 g at 06/09/16 0940 .  levETIRAcetam (KEPPRA) tablet 500 mg, 500 mg, Oral, BID, Darrel Reach, MD, 500 mg at 06/09/16 0939 .  LORazepam (ATIVAN) tablet 0.5 mg, 0.5 mg, Oral, Once PRN, Dron Tanna Furry, MD .  multivitamin with minerals tablet 1 tablet, 1 tablet, Oral, Daily, Vianne Bulls, MD, 1 tablet at 06/09/16 0939 .  ondansetron (ZOFRAN) tablet 4 mg, 4 mg, Oral, Q6H PRN **OR** ondansetron (ZOFRAN) injection 4 mg, 4 mg, Intravenous, Q6H PRN, Ilene Qua Opyd, MD .  oxyCODONE (Oxy IR/ROXICODONE) immediate release tablet 5 mg, 5 mg, Oral, Q6H PRN, Dron Tanna Furry, MD, 5 mg at 06/05/16 1756 .  pantoprazole (PROTONIX) EC tablet 40 mg, 40 mg, Oral, Daily,  Ilene Qua Opyd, MD, 40 mg at 06/09/16 0940 .  polyvinyl alcohol (LIQUIFILM TEARS) 1.4 % ophthalmic solution 1 drop, 1 drop, Both Eyes, Daily PRN, Ilene Qua Opyd, MD .  potassium chloride 20 MEQ/15ML (10%) solution 20 mEq, 20 mEq, Oral, Daily, Dron Tanna Furry, MD, 20 mEq at 06/09/16 0940 .  sodium chloride flush (NS) 0.9 % injection 3 mL, 3 mL, Intravenous, Q12H, Ilene Qua Opyd, MD, 3 mL at 06/09/16 0953 .  sodium chloride flush (NS) 0.9 % injection 3 mL, 3 mL, Intravenous, Q12H, Ilene Qua Opyd, MD, 3 mL at 06/09/16 0954 .  sodium chloride flush (NS) 0.9 % injection 3 mL, 3 mL, Intravenous, PRN, Ilene Qua Opyd, MD .  tamsulosin (FLOMAX) capsule 0.4 mg, 0.4 mg, Oral, QODAY, Ilene Qua Opyd, MD, 0.4 mg at 06/09/16 0939  Objective:  Temp:  [97.8 F (36.6 C)-98.3 F (36.8 C)] 98.3 F (36.8 C) (04/07 0456) Pulse Rate:  [74-140] 87 (04/07 0456) Resp:  [16-18] 16 (04/07 0456) BP: (98-141)/(65-67) 110/67 (04/07 0456) SpO2:  [95 %-96 %] 96 % (04/07 0456) Weight:  [73.8 kg (162 lb 12.8 oz)] 73.8 kg (162 lb 12.8 oz) (04/07 0700)  General: WDWN lying comfortably in bed in NAD. Alert, oriented to self, month, hospital. He is hard of hearing. Speech is clear with minimal dysarthria. He has no aphasia. Affect is bright. Comportment is normal.  HEENT: Neck is supple without lymphadenopathy. Mucous membranes are slightly dry and the  oropharynx is clear. Sclerae are anicteric. There is no conjunctival injection.  CV: Regular, no murmur. Carotid pulses are 2+ and symmetric with no bruits. Distal pulses 2+ and symmetric.  Lungs: CTAB  Extremities: No C/C/E. Neuro: MS: As noted above.  CN: Pupils are equal and reactive from 3-->2 mm bilaterally. EOMI, no nystagmus. Facial sensation is intact to light touch. Face is symmetric at rest with normal strength and mobility. Hearing is diminished to conversational voice and have been very loudly in order to be heard correctly. Voice is normal in tone and quality.  Palate elevates symmetrically. Uvula is midline. Bilateral SCM and trapezii are 5/5. Tongue is midline with normal bulk and mobility.  Motor: Normal bulk, tone. He has a mild right hemiparesis. No tremor or other abnormal movements are observed.  Sensation: Intact to light touch.  DTRs: 2+, brisker on the right than the left. Toes are downgoing on the left, mute on the right. Coordination: Finger-to-nose and heel-to-shin are slow on the right side because of weakness, no overt dysmetria.    Labs: Lab Results  Component Value Date   WBC 10.7 (H) 06/09/2016   HGB 10.8 (L) 06/09/2016   HCT 33.5 (L) 06/09/2016   PLT 369 06/09/2016   GLUCOSE 111 (H) 06/09/2016   CHOL 77 03/31/2016   TRIG 37 03/31/2016   HDL 32 (L) 03/31/2016   LDLCALC 38 03/31/2016   ALT 17 06/04/2016   AST 40 06/04/2016   NA 130 (L) 06/09/2016   K 4.0 06/09/2016   CL 101 06/09/2016   CREATININE 0.86 06/09/2016   BUN 13 06/09/2016   CO2 21 (L) 06/09/2016   TSH 3.437 06/04/2016   INR 1.09 03/30/2016   HGBA1C 5.0 03/31/2016   CBC Latest Ref Rng & Units 06/09/2016 06/08/2016 06/07/2016  WBC 4.0 - 10.5 K/uL 10.7(H) 13.1(H) 11.4(H)  Hemoglobin 13.0 - 17.0 g/dL 10.8(L) 11.4(L) 11.5(L)  Hematocrit 39.0 - 52.0 % 33.5(L) 34.6(L) 34.8(L)  Platelets 150 - 400 K/uL 369 315 318    Lab Results  Component Value Date   HGBA1C 5.0 03/31/2016   Lab Results  Component Value Date   ALT 17 06/04/2016   AST 40 06/04/2016   ALKPHOS 77 06/04/2016   BILITOT 0.9 06/04/2016    Radiology:  There is no new neuro imaging.  Other diagnostic studies:  TTE has been completed but report is not yet available.  A/P:   1. Acute ischemic stroke: Imaging shows a punctate area of acute ischemia in the right hippocampus. His known risk factors for stroke include CAD, history of prior stroke, diabetes, hypertension, hyperlipidemia, atrial fibrillation, carotid stenosis, and age. Given that he just had recent stroke workup in January 2018, most  of this was not repeated. Since he did not have a transthoracic echo at that time, this was obtained with results still pending. Prior CT angiography showed 60-70% stenosis of the right internal carotid artery. His stroke could be embolic either from his carotid or from his underlying atrial fibrillation. He is not likely to be a good candidate for intervention but would consider outpatient vascular surgery evaluation of his carotids given new stroke which could represent symptomatic right internal carotid artery stenosis. Continue with risk factor modification. Continue anticoagulation with Eliquis. Continue atorvastatin. No need for permissive hypertension at this time. Aggressively treat hyperglycemia and fever as needed.  2. Probable seizure: The spell on the day of admission is most suggestive of a seizure. History suggests probable focal onset with secondary generalization followed  by postictal agitation and confusion. Keppra was initiated last night and will be continued at a dose of 500 mg twice daily. Seizure precautions.  3. Right internal carotid artery stenosis: This was identified on previous CT angiography in January 2018. At that time, this was considered asymptomatic. Now that he has a small right hippocampus stroke, cannot exclude artery-to-artery embolism from his carotid. As noted above, he is not likely to be a great candidate for intervention but would consider outpatient vascular surgery evaluation. In the meantime, continue with aggressive risk factor modification. Continue Eliquis and atorvastatin.  4. Encephalopathy: For the most part, this has resolved. He has had some intermittent confusion during this hospitalization, suggesting mild delirium. Continue to optimize metabolic status and minimize CNS active medications. Overall, he seems to be doing quite well. Observe.  5. Chronic right hemiparesis: This is due to his recent stroke. No acute issues.  This was discussed with the  patient and his wife. They're in agreement with the plan as noted. They were given the opportunity to ask any questions and these were addressed to their satisfaction.  According to notes, the patient has been admitted to [facility] with expected transfer today or tomorrow. At this time I have no additional recommendations and will sign off. Please call with any additional questions should arise.   Melba Coon, MD [facility]

## 2016-06-09 NOTE — Consult Note (Signed)
Cardiology Consult Note  Admit date: 06/04/2016 Name: Herbert Marquez 83 y.o.  male DOB:  1927/07/10 MRN:  [id number]  Today's date:  06/09/2016  Referring Physician:  [facility]  Primary Cardiologist: Nahser  Reason for Consultation:    Irregular heart rate   IMPRESSIONS: 1. History of paroxysmal atrial fibrillation currently on Eliquis 2. Current tachycardia question if it could possibly be sinus tachycardia with Wenkebach or could be atrial tachycardia 3. Recent stroke 4. CAD with previous stenting stable 5. Hypertensive heart disease 6. Delirium  RECOMMENDATION: 1. Continue to monitor rhythm on telemetry and increased beta blockers 2. Continue anticoagulation 3. Review echo  HISTORY: This 83 year old male has a history of previous CAD with previous stenting, hypertension and prior stroke. He has been on Eliquis for paroxysmal atrial fibrillation that was diagnosed previously. He presented to the hospital with a mental status change from the urologist's office and became agitated there. He was thought to possibly have some seizures since then and he has been continued on his Eliquis. He was found to have an acute stroke. He has not had any chest pain or shortness of breath. He had some tachycardia last evening and cardiology was asked to evaluate him today. He denies PND or orthopnea or edema.  Past Medical History:  Diagnosis Date  . Arthritis    "pretty much all over"   . Basal cell carcinoma    "several burned off his body, face, head"  . BPH (benign prostatic hypertrophy)   . Coronary artery disease    a. s/p PCI of RCA in 2006  . CVA (cerebral infarction)    a. 06/2015: left thalamic and bilateral PCA  . GERD (gastroesophageal reflux disease)   . Hyperlipidemia   . Hypertension   . TIA (transient ischemic attack)    Approximately 6 weeks post-cardiac catheterization.   . Type II diabetes mellitus ([place])    "prediabetic; lost alot of weight; not diabetic now"  (06/16/2015)      Past Surgical History:  Procedure Laterality Date  . CARDIOVASCULAR STRESS TEST  07/01/2007   EF 74%  . CATARACT EXTRACTION, BILATERAL    . CORONARY ANGIOPLASTY WITH STENT PLACEMENT  10/2004   stenting x 2 to RCA  . FEMUR IM NAIL Right 11/26/2015   Procedure: INTRAMEDULLARY RIGHT (IM) NAIL FEMORAL;  Surgeon: Rod Can, MD;  Location: [facility];  Service: Orthopedics;  Laterality: Right;  . HERNIA REPAIR    . HIP ARTHROPLASTY  03/09/2011   Procedure: ARTHROPLASTY BIPOLAR HIP;  Surgeon: Mauri Pole;  Location: [facility];  Service: Orthopedics;  Laterality: Left;  . LAPAROSCOPIC INCISIONAL / UMBILICAL / VENTRAL HERNIA REPAIR     "below his naval"     Allergies:  has No Known Allergies.   Medications: Prior to Admission medications   Medication Sig Start Date End Date Taking? Authorizing Provider  acetaminophen (TYLENOL) 500 MG tablet Take 1 tablet (500 mg total) by mouth every 6 (six) hours as needed for mild pain. Patient taking differently: Take 1,000 mg by mouth every 6 (six) hours as needed for mild pain.  04/13/16  Yes Ivan Anchors Love, PA-C  apixaban (ELIQUIS) 5 MG TABS tablet Take 1 tablet (5 mg total) by mouth 2 (two) times daily. 04/13/16  Yes Ivan Anchors Love, PA-C  atorvastatin (LIPITOR) 80 MG tablet Take 1 tablet (80 mg total) by mouth daily. Patient taking differently: Take 80 mg by mouth at bedtime.  05/11/16  Yes Odella Aquas, NP  Menthol,  Topical Analgesic, (BLUE-EMU MAXIMUM STRENGTH EX) Apply 1 application topically at bedtime. Knee pain   Yes Historical Provider, MD  Multiple Vitamin (MULTIVITAMIN WITH MINERALS) TABS tablet Take 1 tablet by mouth daily.   Yes Historical Provider, MD  omeprazole (PRILOSEC) 20 MG capsule Take 20 mg by mouth daily.   Yes Historical Provider, MD  polyvinyl alcohol (ARTIFICIAL TEARS) 1.4 % ophthalmic solution Place 1 drop into both eyes daily as needed for dry eyes.   Yes Historical Provider, MD  tamsulosin (FLOMAX) 0.4 MG CAPS capsule  Take 0.4 mg by mouth every other day.    Yes Historical Provider, MD  citalopram (CELEXA) 10 MG tablet Take 10 mg by mouth daily.    Historical Provider, MD  nitroGLYCERIN (NITROSTAT) 0.4 MG SL tablet Place 0.4 mg under the tongue every 5 (five) minutes as needed for chest pain.    Historical Provider, MD    Family History: Family Status  Relation Status  . Mother Deceased at age 40  . Father Deceased at age 42  . Brother Deceased at age 81    Social History:   reports that he quit smoking about 45 years ago. He has never used smokeless tobacco. He reports that he drinks about 4.8 oz of alcohol per week . He reports that he does not use drugs.   Social History   Social History Narrative   Lives in [place], [place] with wife. Has 3 children.     Review of Systems: He has symptoms of BPH and urinary retention. He has pain involving his leg due to previous arthritis. He has had some delirium but is more oriented today.  Physical Exam: BP 110/67 (BP Location: Right Arm)   Pulse 87   Temp 98.3 F (36.8 C) (Oral)   Resp 16   Ht 5\' 7"  (1.702 m)   Wt 73.8 kg (162 lb 12.8 oz)   SpO2 96%   BMI 25.50 kg/m   General appearance: Pleasant thin elderly male hard of hearing Head: Normocephalic, without obvious abnormality, atraumatic, Balding male hair pattern Eyes: conjunctivae/corneas clear. PERRL, EOM's intact. Fundi benign. Neck: no adenopathy, no JVD, supple, symmetrical, trachea midline and Soft right carotid bruit heard Lungs: clear to auscultation bilaterally Heart: Regular rhythm with occasional irregular beats Abdomen: soft, non-tender; bowel sounds normal; no masses,  no organomegaly Rectal: deferred Extremities: extremities normal, atraumatic, no cyanosis or edema Pulses: 2+ and symmetric Skin: Skin color, texture, turgor normal. No rashes or lesions Neurologic: Mild Right hemiparesis Psych: Alert and oriented x 3 Labs: CBC  Recent Labs  06/09/16 0159  WBC 10.7*   RBC 3.69*  HGB 10.8*  HCT 33.5*  PLT 369  MCV 90.8  MCH 29.3  MCHC 32.2  RDW 15.3   CMP   Recent Labs  06/09/16 0159  NA 130*  K 4.0  CL 101  CO2 21*  GLUCOSE 111*  BUN 13  CREATININE 0.86  CALCIUM 8.5*  GFRNONAA >60  GFRAA >60    Radiology:  Mild interstitial edema, small right effusion  EKG: Sinus rhythm with first-degree AV block, left anterior fascicular block, PVCs Independently reviewed by me   ECHO: EF 55-60%, mild left atrial enlargement  Signed:  W. Doristine Church MD [facility]   Cardiology Consultant  06/09/2016, 1:20 PM

## 2016-06-09 NOTE — Progress Notes (Signed)
Pt's heart rate suddenly rose to 140s and went back and forth from 125 to 148. Monitor said it was VTach.  Pt was sleeping during change and asymptomatic.  BP was 98/65 in right arm with heart rate at 140.  On-call hospitalist was informed of rate and ordered a stat EKG. When EKG was done, heart rate dropped to 90s and showed NSR w/ first degree HB and occasional PVCs. Hospitalist in formed of EKG and no new orders given.  Pt resting in bed. Will continue to monitor pt.  Lupita Dawn, RN

## 2016-06-09 NOTE — Progress Notes (Signed)
  Echocardiogram 2D Echocardiogram has been performed.  Jennette Dubin 06/09/2016, 1:25 PM

## 2016-06-09 NOTE — Progress Notes (Signed)
PROGRESS NOTE    [name]  IDP:[id number] DOB: [birth year] DOA: 06/04/2016 PCP: Lillard Anes, NP   Brief Narrative: 83 y.o. male with medical history significant for coronary artery disease with stent, history of CVA, GERD, and BPH who presents from his urologist's office for evaluation of acute change in mental status. Patient has history of BPH and UTIs and was in the urology clinic for evaluation  when he became acutely altered, noted to be agitated. His wife witnessed the episode and reports that it began with "drawing up of the right face" briefly, and that was followed by the agitation and generalized weakness.  Assessment & Plan:   #  Acute encephalopathy likely due to acute stroke and possible seizure as per neurologist. -repeat MRI with stroke.  -EEG with no epileptic foci -TSH, ammonia and b12 level unremarkable. --Mentally status improved significantly today. -PT OT evaluated the patient, plan for CIR when medical work up completes.  # Acute stroke: repeat MRI showed 4 mm right hippocampal infarct and old left temporal lobe hemorrhage. Pt had CVA in January this year when received TPA.  -MRI/MRA done -echo pending -SLP eval, on dysphagia diet -already working with PT/OT  -on statin and eliquis -needs outpatient follow up with vascular surgeon for the evaluation of right ICA stenosis. On medical management now.   #Staph epidermidis, micrococcus bacteremia: Likely contaminant. Initially treated with IV vancomycin until the final culture result (pt also with mild fever, leukocytosis and encephalopathy). Since the other culture bottle grew micrococcus it is most likely contaminant. I reviewed/consulted with Dr. Tommy Medal from ID on 06/07/2016. He recommended to dc antibiotics. Follow up repeat culture results, negative so far.  -Influenza and respiratory viral panel negative -Continue supportive care.  # Possible seizure as per neurologist: started on keppra. Continue seizure  precaution.  #Right leg pain: X-ray of right leg pain which was consistent with degenerative changes with no acute finding. Patient denied pain today. Continue rehabilitation.  #Paroxysmal atrial fibrillation with tachycardia mostly in the night: on eliquis. He was asymptomatic. I discussed with Cardiologist Dr. Stanford Breed who recommended to start low dose metoprolol and monitor HR. Will observe today. Echo pending.   #History of coronary artery disease  #Hyponatremia: serum Na stable. Monitor BMP  DVT prophylaxis: Systemic anticoagulation Code Status: Full code Family Communication: discussed with pt's wife at bedside.   Disposition Plan: Likely discharge to CIR in 1-2 days    Consultants:   Phone consult to ID  Neurologist  Phone discussion with cardiologist  Procedures: EEG, CT MRI head, MRI, echo pending Antimicrobials: None  Subjective: Patient was seen and examined at bedside. Patient looked more alert awake today. Wife at bedside. Denied headache, dizziness, nausea, vertigo, chest pain or shortness of breath.  Objective: Vitals:   06/08/16 2023 06/09/16 0055 06/09/16 0456 06/09/16 0700  BP: (!) 141/66 98/65 110/67   Pulse: 74 (!) 140 87   Resp: 18  16   Temp: 97.8 F (36.6 C)  98.3 F (36.8 C)   TempSrc: Oral  Oral   SpO2: 95%  96%   Weight:    73.8 kg (162 lb 12.8 oz)  Height:        Intake/Output Summary (Last 24 hours) at 06/09/16 1046 Last data filed at 06/09/16 0954  Gross per 24 hour  Intake              366 ml  Output  1000 ml  Net             -634 ml   Filed Weights   06/07/16 0452 06/08/16 0427 06/09/16 0700  Weight: 74.2 kg (163 lb 9.6 oz) 73.1 kg (161 lb 3.2 oz) 73.8 kg (162 lb 12.8 oz)    Examination:  General exam: alert, awake, looks more alert today Respiratory system: Clear bilateral. Respiratory effort normal. No wheezing or crackle Cardiovascular system: Regular rate rhythm, S1-S2 normal Gastrointestinal system: Abdomen  is nondistended, soft and nontender. Normal bowel sounds heard. Central nervous system: Alert awake and following commands. Skin: No rashes, lesions or ulcers Psychiatry: Judgement and insight appear impaired.    Data Reviewed: I have personally reviewed following labs and imaging studies  CBC:  Recent Labs Lab 06/04/16 1630 06/04/16 2139 06/05/16 0137 06/07/16 0237 06/08/16 0212 06/09/16 0159  WBC 8.2  --  11.2* 11.4* 13.1* 10.7*  NEUTROABS 5.4  --  8.6*  --   --   --   HGB 12.6*  --  11.5* 11.5* 11.4* 10.8*  HCT 37.6* 35.8* 34.3* 34.8* 34.6* 33.5*  MCV 89.7  --  89.6 90.2 90.8 90.8  PLT 350  --  320 318 315 423   Basic Metabolic Panel:  Recent Labs Lab 06/04/16 1630 06/04/16 2001 06/05/16 0137 06/07/16 0237 06/08/16 0212 06/09/16 0159  NA 134*  --  133* 131* 129* 130*  K 3.7  --  3.7 3.4* 4.3 4.0  CL 101  --  103 102 100* 101  CO2 22  --  21* 20* 21* 21*  GLUCOSE 98  --  106* 112* 108* 111*  BUN 11  --  10 7 13 13   CREATININE 1.01  --  0.93 0.82 0.82 0.86  CALCIUM 9.2  --  8.7* 8.4* 8.5* 8.5*  MG  --  1.6*  --  1.8  --   --    GFR: Estimated Creatinine Clearance: 57.6 mL/min (by C-G formula based on SCr of 0.86 mg/dL). Liver Function Tests:  Recent Labs Lab 06/04/16 1630  AST 40  ALT 17  ALKPHOS 77  BILITOT 0.9  PROT 6.8  ALBUMIN 3.6   No results for input(s): LIPASE, AMYLASE in the last 168 hours.  Recent Labs Lab 06/05/16 1450  AMMONIA 37*   Coagulation Profile: No results for input(s): INR, PROTIME in the last 168 hours. Cardiac Enzymes:  Recent Labs Lab 06/04/16 1630  TROPONINI <0.03   BNP (last 3 results) No results for input(s): PROBNP in the last 8760 hours. HbA1C: No results for input(s): HGBA1C in the last 72 hours. CBG:  Recent Labs Lab 06/04/16 1753 06/06/16 0800  GLUCAP 97 131*   Lipid Profile: No results for input(s): CHOL, HDL, LDLCALC, TRIG, CHOLHDL, LDLDIRECT in the last 72 hours. Thyroid Function Tests: No  results for input(s): TSH, T4TOTAL, FREET4, T3FREE, THYROIDAB in the last 72 hours. Anemia Panel: No results for input(s): VITAMINB12, FOLATE, FERRITIN, TIBC, IRON, RETICCTPCT in the last 72 hours. Sepsis Labs:  Recent Labs Lab 06/04/16 1657 06/05/16 0137 06/05/16 0340  LATICACIDVEN 2.60* 0.9 0.7    Recent Results (from the past 240 hour(s))  Blood Culture (routine x 2)     Status: Abnormal   Collection Time: 06/04/16  4:30 PM  Result Value Ref Range Status   Specimen Description BLOOD RIGHT ANTECUBITAL  Final   Special Requests   Final    BOTTLES DRAWN AEROBIC ONLY Blood Culture adequate volume   Culture  Setup Time  Final    GRAM POSITIVE COCCI IN CLUSTERS AEROBIC BOTTLE ONLY CRITICAL VALUE NOTED.  VALUE IS CONSISTENT WITH PREVIOUSLY REPORTED AND CALLED VALUE.    Culture (A)  Final    MICROCOCCUS LUTEUS/LYLAE Standardized susceptibility testing for this organism is not available.    Report Status 06/07/2016 FINAL  Final  Blood Culture (routine x 2)     Status: Abnormal   Collection Time: 06/04/16  4:35 PM  Result Value Ref Range Status   Specimen Description BLOOD LEFT FOREARM  Final   Special Requests   Final    BOTTLES DRAWN AEROBIC ONLY Blood Culture adequate volume   Culture  Setup Time   Final    GRAM POSITIVE COCCI IN CLUSTERS AEROBIC BOTTLE ONLY CRITICAL RESULT CALLED TO, READ BACK BY AND VERIFIED WITH: P. Dang Pharm.D. 16:30 06/05/16 ([email or web address])    Culture (A)  Final    STAPHYLOCOCCUS EPIDERMIDIS THE SIGNIFICANCE OF ISOLATING THIS ORGANISM FROM A SINGLE SET OF BLOOD CULTURES WHEN MULTIPLE SETS ARE DRAWN IS UNCERTAIN. PLEASE NOTIFY THE MICROBIOLOGY DEPARTMENT WITHIN ONE WEEK IF SPECIATION AND SENSITIVITIES ARE REQUIRED.    Report Status 06/07/2016 FINAL  Final  Blood Culture ID Panel (Reflexed)     Status: Abnormal   Collection Time: 06/04/16  4:35 PM  Result Value Ref Range Status   Enterococcus species NOT DETECTED NOT DETECTED Final   Listeria monocytogenes  NOT DETECTED NOT DETECTED Final   Staphylococcus species DETECTED (A) NOT DETECTED Final    Comment: Methicillin (oxacillin) resistant coagulase negative staphylococcus. Possible blood culture contaminant (unless isolated from more than one blood culture draw or clinical case suggests pathogenicity). No antibiotic treatment is indicated for blood  culture contaminants. CRITICAL RESULT CALLED TO, READ BACK BY AND VERIFIED WITH: P. Dang Pharm.D. 16:30 06/05/16 ([email or web address])    Staphylococcus aureus NOT DETECTED NOT DETECTED Final   Methicillin resistance DETECTED (A) NOT DETECTED Final    Comment: CRITICAL RESULT CALLED TO, READ BACK BY AND VERIFIED WITH: P. Dang Pharm.D. 16:30 06/05/16 ([email or web address])    Streptococcus species NOT DETECTED NOT DETECTED Final   Streptococcus agalactiae NOT DETECTED NOT DETECTED Final   Streptococcus pneumoniae NOT DETECTED NOT DETECTED Final   Streptococcus pyogenes NOT DETECTED NOT DETECTED Final   Acinetobacter baumannii NOT DETECTED NOT DETECTED Final   Enterobacteriaceae species NOT DETECTED NOT DETECTED Final   Enterobacter cloacae complex NOT DETECTED NOT DETECTED Final   Escherichia coli NOT DETECTED NOT DETECTED Final   Klebsiella oxytoca NOT DETECTED NOT DETECTED Final   Klebsiella pneumoniae NOT DETECTED NOT DETECTED Final   Proteus species NOT DETECTED NOT DETECTED Final   Serratia marcescens NOT DETECTED NOT DETECTED Final   Haemophilus influenzae NOT DETECTED NOT DETECTED Final   Neisseria meningitidis NOT DETECTED NOT DETECTED Final   Pseudomonas aeruginosa NOT DETECTED NOT DETECTED Final   Candida albicans NOT DETECTED NOT DETECTED Final   Candida glabrata NOT DETECTED NOT DETECTED Final   Candida krusei NOT DETECTED NOT DETECTED Final   Candida parapsilosis NOT DETECTED NOT DETECTED Final   Candida tropicalis NOT DETECTED NOT DETECTED Final  Respiratory Panel by PCR     Status: None   Collection Time: 06/05/16 12:10 PM  Result Value Ref Range Status     Adenovirus NOT DETECTED NOT DETECTED Final   Coronavirus 229E NOT DETECTED NOT DETECTED Final   Coronavirus HKU1 NOT DETECTED NOT DETECTED Final   Coronavirus NL63 NOT DETECTED NOT DETECTED Final   Coronavirus OC43 NOT DETECTED NOT DETECTED Final  Metapneumovirus NOT DETECTED NOT DETECTED Final   Rhinovirus / Enterovirus NOT DETECTED NOT DETECTED Final   Influenza A NOT DETECTED NOT DETECTED Final   Influenza B NOT DETECTED NOT DETECTED Final   Parainfluenza Virus 1 NOT DETECTED NOT DETECTED Final   Parainfluenza Virus 2 NOT DETECTED NOT DETECTED Final   Parainfluenza Virus 3 NOT DETECTED NOT DETECTED Final   Parainfluenza Virus 4 NOT DETECTED NOT DETECTED Final   Respiratory Syncytial Virus NOT DETECTED NOT DETECTED Final   Bordetella pertussis NOT DETECTED NOT DETECTED Final   Chlamydophila pneumoniae NOT DETECTED NOT DETECTED Final   Mycoplasma pneumoniae NOT DETECTED NOT DETECTED Final  Culture, blood (routine x 2)     Status: None (Preliminary result)   Collection Time: 06/05/16  7:04 PM  Result Value Ref Range Status   Specimen Description BLOOD LEFT ANTECUBITAL  Final   Special Requests   Final    BOTTLES DRAWN AEROBIC AND ANAEROBIC Blood Culture results may not be optimal due to an excessive volume of blood received in culture bottles   Culture NO GROWTH 3 DAYS  Final   Report Status PENDING  Incomplete  Culture, blood (routine x 2)     Status: None (Preliminary result)   Collection Time: 06/05/16  7:06 PM  Result Value Ref Range Status   Specimen Description BLOOD LEFT HAND  Final   Special Requests   Final    BOTTLES DRAWN AEROBIC ONLY Blood Culture results may not be optimal due to an excessive volume of blood received in culture bottles   Culture NO GROWTH 3 DAYS  Final   Report Status PENDING  Incomplete         Radiology Studies: Mr Virgel [place] Contrast  Result Date: 06/07/2016 CLINICAL DATA:  Altered and agitated at urologist office today. Confusion.  History of hypertension, hyperlipidemia, diabetes and stroke. EXAM: MRI HEAD WITHOUT CONTRAST MRA HEAD WITHOUT CONTRAST TECHNIQUE: Multiplanar, multiecho pulse sequences of the brain and surrounding structures were obtained without intravenous contrast. Angiographic images of the head were obtained using MRA technique without contrast. COMPARISON:  CT HEAD June 04, 2016 and MRI of the head June 04, 2016 in CTA HEAD March 31, 2016 FINDINGS: MRI HEAD FINDINGS BRAIN: 4 mm focus of reduced diffusion RIGHT hippocampus. Due to small size, limited assessment on ADC map. Susceptibility artifact LEFT temporal lobe at site of prior intraparenchymal hematoma. Ventricles and sulci are normal for patient's age. Confluent supratentorial white matter FLAIR T2 hyperintensities. Old LEFT thalamus lacunar infarct. No abnormal extra-axial fluid collections. 7 mm pineal cyst. VASCULAR: Normal major intracranial vascular flow voids present at skull base. SKULL AND UPPER CERVICAL SPINE: No abnormal sellar expansion. No suspicious calvarial bone marrow signal. Craniocervical junction maintained. SINUSES/ORBITS: The mastoid air-cells and included paranasal sinuses are well-aerated. The included ocular globes and orbital contents are non-suspicious. Status post bilateral ocular lens implants. OTHER: None. MRA HEAD FINDINGS ANTERIOR CIRCULATION: Flow related enhancement of the included cervical, petrous, cavernous and supraclinoid internal carotid arteries. RIGHT tonsillar loop. Mild stenosis bilateral supraclinoid internal carotid artery's. Patent anterior communicating artery. Normal flow related enhancement of the anterior and middle cerebral arteries, including distal segments. No large vessel occlusion, high-grade stenosis, abnormal luminal irregularity, aneurysm. POSTERIOR CIRCULATION: LEFT vertebral artery is dominant. Basilar artery is patent, with normal flow related enhancement of the main branch vessels. Normal flow related  enhancement of the posterior cerebral arteries. No large vessel occlusion, high-grade stenosis, abnormal luminal irregularity, aneurysm. ANATOMIC VARIANTS: Hypoplastic LEFT A1 segment.  IMPRESSION: MRI HEAD: 4 mm RIGHT hippocampal infarct is likely acute. Old LEFT temporal lobe hemorrhage. Moderate chronic small vessel ischemic disease. Old LEFT thalamus lacunar infarct. MRA HEAD: No emergent large vessel occlusion or severe stenosis. Electronically Signed   By: Elon Alas M.D.   On: 06/07/2016 20:47   Mr Brain Wo Contrast  Result Date: 06/07/2016 CLINICAL DATA:  Altered and agitated at urologist office today. Confusion. History of hypertension, hyperlipidemia, diabetes and stroke. EXAM: MRI HEAD WITHOUT CONTRAST MRA HEAD WITHOUT CONTRAST TECHNIQUE: Multiplanar, multiecho pulse sequences of the brain and surrounding structures were obtained without intravenous contrast. Angiographic images of the head were obtained using MRA technique without contrast. COMPARISON:  CT HEAD June 04, 2016 and MRI of the head June 04, 2016 in CTA HEAD March 31, 2016 FINDINGS: MRI HEAD FINDINGS BRAIN: 4 mm focus of reduced diffusion RIGHT hippocampus. Due to small size, limited assessment on ADC map. Susceptibility artifact LEFT temporal lobe at site of prior intraparenchymal hematoma. Ventricles and sulci are normal for patient's age. Confluent supratentorial white matter FLAIR T2 hyperintensities. Old LEFT thalamus lacunar infarct. No abnormal extra-axial fluid collections. 7 mm pineal cyst. VASCULAR: Normal major intracranial vascular flow voids present at skull base. SKULL AND UPPER CERVICAL SPINE: No abnormal sellar expansion. No suspicious calvarial bone marrow signal. Craniocervical junction maintained. SINUSES/ORBITS: The mastoid air-cells and included paranasal sinuses are well-aerated. The included ocular globes and orbital contents are non-suspicious. Status post bilateral ocular lens implants. OTHER: None. MRA  HEAD FINDINGS ANTERIOR CIRCULATION: Flow related enhancement of the included cervical, petrous, cavernous and supraclinoid internal carotid arteries. RIGHT tonsillar loop. Mild stenosis bilateral supraclinoid internal carotid artery's. Patent anterior communicating artery. Normal flow related enhancement of the anterior and middle cerebral arteries, including distal segments. No large vessel occlusion, high-grade stenosis, abnormal luminal irregularity, aneurysm. POSTERIOR CIRCULATION: LEFT vertebral artery is dominant. Basilar artery is patent, with normal flow related enhancement of the main branch vessels. Normal flow related enhancement of the posterior cerebral arteries. No large vessel occlusion, high-grade stenosis, abnormal luminal irregularity, aneurysm. ANATOMIC VARIANTS: Hypoplastic LEFT A1 segment. IMPRESSION: MRI HEAD: 4 mm RIGHT hippocampal infarct is likely acute. Old LEFT temporal lobe hemorrhage. Moderate chronic small vessel ischemic disease. Old LEFT thalamus lacunar infarct. MRA HEAD: No emergent large vessel occlusion or severe stenosis. Electronically Signed   By: Elon Alas M.D.   On: 06/07/2016 20:47        Scheduled Meds: . apixaban  5 mg Oral BID  . atorvastatin  80 mg Oral QHS  . diclofenac sodium  2 g Topical QID  . levETIRAcetam  500 mg Oral BID  . metoprolol tartrate  12.5 mg Oral BID  . multivitamin with minerals  1 tablet Oral Daily  . pantoprazole  40 mg Oral Daily  . potassium chloride  20 mEq Oral Daily  . sodium chloride flush  3 mL Intravenous Q12H  . sodium chloride flush  3 mL Intravenous Q12H  . tamsulosin  0.4 mg Oral QODAY   Continuous Infusions:   LOS: 4 days     Tanna Furry, MD [facility] Pager [phone]  If 7PM-7AM, please contact night-coverage [email or web address] Password [email or web address] 06/09/2016, 10:46 AM

## 2016-06-10 ENCOUNTER — Inpatient Hospital Stay (HOSPITAL_COMMUNITY)
Admission: RE | Admit: 2016-06-10 | Discharge: 2016-06-20 | DRG: 057 | Disposition: A | Discharge status: Home Health | Payer: Medicare Other | Source: Intra-hospital | Attending: Physical Medicine & Rehabilitation | Admitting: Physical Medicine & Rehabilitation

## 2016-06-10 ENCOUNTER — Inpatient Hospital Stay (HOSPITAL_COMMUNITY): Payer: Medicare Other | Admitting: Physical Therapy

## 2016-06-10 ENCOUNTER — Inpatient Hospital Stay (HOSPITAL_COMMUNITY): Payer: Medicare Other

## 2016-06-10 DIAGNOSIS — I519 Heart disease, unspecified: Secondary | ICD-10-CM

## 2016-06-10 DIAGNOSIS — M1711 Unilateral primary osteoarthritis, right knee: Secondary | ICD-10-CM

## 2016-06-10 DIAGNOSIS — Z955 Presence of coronary angioplasty implant and graft: Secondary | ICD-10-CM

## 2016-06-10 DIAGNOSIS — Z87891 Personal history of nicotine dependence: Secondary | ICD-10-CM

## 2016-06-10 DIAGNOSIS — I480 Paroxysmal atrial fibrillation: Secondary | ICD-10-CM

## 2016-06-10 DIAGNOSIS — I2510 Atherosclerotic heart disease of native coronary artery without angina pectoris: Secondary | ICD-10-CM | POA: Diagnosis not present

## 2016-06-10 DIAGNOSIS — Z8673 Personal history of transient ischemic attack (TIA), and cerebral infarction without residual deficits: Secondary | ICD-10-CM | POA: Diagnosis not present

## 2016-06-10 DIAGNOSIS — H9190 Unspecified hearing loss, unspecified ear: Secondary | ICD-10-CM

## 2016-06-10 DIAGNOSIS — I6930 Unspecified sequelae of cerebral infarction: Secondary | ICD-10-CM

## 2016-06-10 DIAGNOSIS — K219 Gastro-esophageal reflux disease without esophagitis: Secondary | ICD-10-CM | POA: Diagnosis not present

## 2016-06-10 DIAGNOSIS — E871 Hypo-osmolality and hyponatremia: Secondary | ICD-10-CM

## 2016-06-10 DIAGNOSIS — I69398 Other sequelae of cerebral infarction: Secondary | ICD-10-CM

## 2016-06-10 DIAGNOSIS — D62 Acute posthemorrhagic anemia: Secondary | ICD-10-CM

## 2016-06-10 DIAGNOSIS — Z96642 Presence of left artificial hip joint: Secondary | ICD-10-CM

## 2016-06-10 DIAGNOSIS — R269 Unspecified abnormalities of gait and mobility: Secondary | ICD-10-CM | POA: Diagnosis not present

## 2016-06-10 DIAGNOSIS — Z298 Encounter for other specified prophylactic measures: Secondary | ICD-10-CM

## 2016-06-10 DIAGNOSIS — D72829 Elevated white blood cell count, unspecified: Secondary | ICD-10-CM | POA: Diagnosis not present

## 2016-06-10 DIAGNOSIS — G8190 Hemiplegia, unspecified affecting unspecified side: Secondary | ICD-10-CM

## 2016-06-10 DIAGNOSIS — R1310 Dysphagia, unspecified: Secondary | ICD-10-CM

## 2016-06-10 DIAGNOSIS — Z7901 Long term (current) use of anticoagulants: Secondary | ICD-10-CM

## 2016-06-10 DIAGNOSIS — I69393 Ataxia following cerebral infarction: Secondary | ICD-10-CM | POA: Diagnosis not present

## 2016-06-10 DIAGNOSIS — I4891 Unspecified atrial fibrillation: Secondary | ICD-10-CM

## 2016-06-10 DIAGNOSIS — I10 Essential (primary) hypertension: Secondary | ICD-10-CM

## 2016-06-10 DIAGNOSIS — E785 Hyperlipidemia, unspecified: Secondary | ICD-10-CM | POA: Diagnosis not present

## 2016-06-10 DIAGNOSIS — Z79899 Other long term (current) drug therapy: Secondary | ICD-10-CM | POA: Diagnosis not present

## 2016-06-10 DIAGNOSIS — N3942 Incontinence without sensory awareness: Secondary | ICD-10-CM | POA: Diagnosis not present

## 2016-06-10 DIAGNOSIS — T446X5A Adverse effect of alpha-adrenoreceptor antagonists, initial encounter: Secondary | ICD-10-CM

## 2016-06-10 DIAGNOSIS — I69391 Dysphagia following cerebral infarction: Principal | ICD-10-CM

## 2016-06-10 DIAGNOSIS — I639 Cerebral infarction, unspecified: Secondary | ICD-10-CM

## 2016-06-10 DIAGNOSIS — I6310 Cerebral infarction due to embolism of unspecified precerebral artery: Secondary | ICD-10-CM | POA: Diagnosis not present

## 2016-06-10 DIAGNOSIS — N400 Enlarged prostate without lower urinary tract symptoms: Secondary | ICD-10-CM

## 2016-06-10 DIAGNOSIS — G9340 Encephalopathy, unspecified: Secondary | ICD-10-CM | POA: Diagnosis present

## 2016-06-10 DIAGNOSIS — Z85828 Personal history of other malignant neoplasm of skin: Secondary | ICD-10-CM | POA: Diagnosis not present

## 2016-06-10 DIAGNOSIS — I63431 Cerebral infarction due to embolism of right posterior cerebral artery: Secondary | ICD-10-CM | POA: Diagnosis not present

## 2016-06-10 DIAGNOSIS — M25551 Pain in right hip: Secondary | ICD-10-CM

## 2016-06-10 DIAGNOSIS — F0390 Unspecified dementia without behavioral disturbance: Secondary | ICD-10-CM | POA: Diagnosis not present

## 2016-06-10 DIAGNOSIS — R4182 Altered mental status, unspecified: Secondary | ICD-10-CM | POA: Diagnosis present

## 2016-06-10 DIAGNOSIS — I5189 Other ill-defined heart diseases: Secondary | ICD-10-CM

## 2016-06-10 DIAGNOSIS — I6349 Cerebral infarction due to embolism of other cerebral artery: Secondary | ICD-10-CM | POA: Diagnosis not present

## 2016-06-10 DIAGNOSIS — I951 Orthostatic hypotension: Secondary | ICD-10-CM | POA: Diagnosis present

## 2016-06-10 DIAGNOSIS — I69993 Ataxia following unspecified cerebrovascular disease: Secondary | ICD-10-CM | POA: Diagnosis not present

## 2016-06-10 DIAGNOSIS — G8191 Hemiplegia, unspecified affecting right dominant side: Secondary | ICD-10-CM

## 2016-06-10 DIAGNOSIS — R32 Unspecified urinary incontinence: Secondary | ICD-10-CM

## 2016-06-10 DIAGNOSIS — I63432 Cerebral infarction due to embolism of left posterior cerebral artery: Secondary | ICD-10-CM | POA: Diagnosis not present

## 2016-06-10 DIAGNOSIS — Z2989 Encounter for other specified prophylactic measures: Secondary | ICD-10-CM

## 2016-06-10 DIAGNOSIS — I6381 Other cerebral infarction due to occlusion or stenosis of small artery: Secondary | ICD-10-CM

## 2016-06-10 HISTORY — DX: Cerebral infarction, unspecified: I63.9

## 2016-06-10 LAB — GLUCOSE, CAPILLARY: Glucose-Capillary: 96 mg/dL (ref 65–99)

## 2016-06-10 LAB — CULTURE, BLOOD (ROUTINE X 2)
Culture: NO GROWTH
Culture: NO GROWTH

## 2016-06-10 MED ORDER — PROCHLORPERAZINE 25 MG RE SUPP: 12.5000 mg | Freq: Four times a day (QID) | RECTAL | Status: DC | PRN

## 2016-06-10 MED ORDER — FLEET ENEMA 7-19 GM/118ML RE ENEM: 1.0000 | ENEMA | Freq: Once | RECTAL | Status: DC | PRN

## 2016-06-10 MED ORDER — LEVETIRACETAM 500 MG PO TABS
500.0000 mg | ORAL_TABLET | Freq: Two times a day (BID) | ORAL | Status: DC
Start: 2016-06-10 — End: 2016-06-20
  Administered 2016-06-10 – 2016-06-20 (×20): 500 mg via ORAL
  Filled 2016-06-10 (×20): qty 1

## 2016-06-10 MED ORDER — POLYETHYLENE GLYCOL 3350 17 G PO PACK: 17.0000 g | PACK | Freq: Every day | ORAL | Status: DC | PRN

## 2016-06-10 MED ORDER — LEVETIRACETAM 500 MG PO TABS: 500.0000 mg | ORAL_TABLET | Freq: Two times a day (BID) | ORAL | 0 refills | Status: DC

## 2016-06-10 MED ORDER — METOPROLOL TARTRATE 25 MG PO TABS: 25.0000 mg | ORAL_TABLET | Freq: Two times a day (BID) | ORAL | 0 refills | Status: DC

## 2016-06-10 MED ORDER — PROCHLORPERAZINE MALEATE 5 MG PO TABS: 5.0000 mg | ORAL_TABLET | Freq: Four times a day (QID) | ORAL | Status: DC | PRN

## 2016-06-10 MED ORDER — ALUM & MAG HYDROXIDE-SIMETH 200-200-20 MG/5ML PO SUSP: 30.0000 mL | ORAL | Status: DC | PRN

## 2016-06-10 MED ORDER — BISACODYL 10 MG RE SUPP: 10.0000 mg | Freq: Every day | RECTAL | Status: DC | PRN

## 2016-06-10 MED ORDER — APIXABAN 5 MG PO TABS
5.0000 mg | ORAL_TABLET | Freq: Two times a day (BID) | ORAL | Status: DC
  Administered 2016-06-10 – 2016-06-20 (×20): 5 mg via ORAL
  Filled 2016-06-10 (×20): qty 1

## 2016-06-10 MED ORDER — DICLOFENAC SODIUM 1 % TD GEL
2.0000 g | Freq: Four times a day (QID) | TRANSDERMAL | Status: DC
  Administered 2016-06-10 – 2016-06-19 (×26): 2 g via TOPICAL
  Filled 2016-06-10: qty 100

## 2016-06-10 MED ORDER — ONDANSETRON HCL 4 MG/2ML IJ SOLN: 4.0000 mg | Freq: Four times a day (QID) | INTRAMUSCULAR | Status: DC | PRN

## 2016-06-10 MED ORDER — PROCHLORPERAZINE EDISYLATE 5 MG/ML IJ SOLN: 5.0000 mg | Freq: Four times a day (QID) | INTRAMUSCULAR | Status: DC | PRN

## 2016-06-10 MED ORDER — POLYVINYL ALCOHOL 1.4 % OP SOLN
1.0000 [drp] | Freq: Every day | OPHTHALMIC | Status: DC | PRN
  Filled 2016-06-10: qty 15

## 2016-06-10 MED ORDER — GUAIFENESIN-DM 100-10 MG/5ML PO SYRP: 5.0000 mL | ORAL_SOLUTION | Freq: Four times a day (QID) | ORAL | Status: DC | PRN

## 2016-06-10 MED ORDER — ACETAMINOPHEN 325 MG PO TABS
325.0000 mg | ORAL_TABLET | ORAL | Status: DC | PRN
  Administered 2016-06-11 – 2016-06-19 (×8): 650 mg via ORAL
  Filled 2016-06-10 (×9): qty 2

## 2016-06-10 MED ORDER — TAMSULOSIN HCL 0.4 MG PO CAPS
0.4000 mg | ORAL_CAPSULE | ORAL | Status: DC
  Administered 2016-06-11: 0.4 mg via ORAL
  Filled 2016-06-10: qty 1

## 2016-06-10 MED ORDER — ATORVASTATIN CALCIUM 80 MG PO TABS
80.0000 mg | ORAL_TABLET | Freq: Every day | ORAL | Status: DC
  Administered 2016-06-10 – 2016-06-19 (×10): 80 mg via ORAL
  Filled 2016-06-10 (×10): qty 1

## 2016-06-10 MED ORDER — DIPHENHYDRAMINE HCL 12.5 MG/5ML PO ELIX: 12.5000 mg | ORAL_SOLUTION | Freq: Four times a day (QID) | ORAL | Status: DC | PRN

## 2016-06-10 MED ORDER — ADULT MULTIVITAMIN W/MINERALS CH
1.0000 | ORAL_TABLET | Freq: Every day | ORAL | Status: DC
  Administered 2016-06-11 – 2016-06-20 (×10): 1 via ORAL
  Filled 2016-06-10 (×10): qty 1

## 2016-06-10 MED ORDER — ONDANSETRON HCL 4 MG PO TABS: 4.0000 mg | ORAL_TABLET | Freq: Four times a day (QID) | ORAL | Status: DC | PRN

## 2016-06-10 MED ORDER — TRAZODONE HCL 50 MG PO TABS: 25.0000 mg | ORAL_TABLET | Freq: Every evening | ORAL | Status: DC | PRN

## 2016-06-10 MED ORDER — PANTOPRAZOLE SODIUM 40 MG PO TBEC
40.0000 mg | DELAYED_RELEASE_TABLET | Freq: Every day | ORAL | Status: DC
  Administered 2016-06-11 – 2016-06-20 (×10): 40 mg via ORAL
  Filled 2016-06-10 (×10): qty 1

## 2016-06-10 NOTE — Progress Notes (Signed)
Pt moved to 4M08 for rehab.

## 2016-06-10 NOTE — Progress Notes (Signed)
Received pt. As a transfer from [facility] W.Pt. And the family were oriented to the unit protocol.Safety plan was explained.Fall prevention plan was explained and signed by the pt's wife and RN.Pt. Has a stage II pressure ulcer on sacrum area.Foam was applied.Keep monitoring closely.

## 2016-06-10 NOTE — Discharge Summary (Signed)
Physician Discharge Summary  DONNIVAN VILLENA IWO:[id number] DOB: Aug 15, 1927 DOA: 06/04/2016  PCP: Lillard Anes, NP  Admit date: 06/04/2016 Discharge date: 06/10/2016  Admitted From: Home Disposition:  CIR  Recommendations for Outpatient Follow-up:  1. Follow up with PCP in 1 week post discharge from CIR  2. Follow-up with outpatient vascular surgery referral for evaluation of right ICA stenosis, currently on medical management 3. Follow-up with neurology in 6-8 weeks for follow-up acute stroke, seizure 4. Follow-up with cardiology after discharge from CIR regarding paroxysmal atrial fibrillation, with tachycardia as well as coronary artery disease  Discharge Condition: Stable CODE STATUS: Full  Diet recommendation: Dysphagia 1   Brief/Interim Summary: From H&P by Dr. Myna Hidalgo: Herbert Marquez is a 83 y.o. male with medical history significant for coronary artery disease with stent, history of CVA, GERD, and BPH who presents from his urologist's office for evaluation of acute change in mental status. Patient has history of BPH and UTIs and was in the urology clinic for evaluation today when he became acutely altered, noted to be agitated. His wife witnessed the episode and reports that it began with "drawing up of the right face" briefly, and that was followed by the agitation and generalized weakness. Upon EMS arrival, patient was found to be generally weak and agitated. He was not oriented. There had been no recent fall or trauma and the patient had not been voicing any specific complaints leading up to this. Patient's wife notes that he missed some spots shaving this morning which was unusual for him, but he otherwise seemed to be in his usual state earlier in the day. He does not have a history of seizure disorder, but does have history of 2 ischemic strokes, one in January 2018 with hemorrhagic conversion. He was brought into the ED for evaluation. Of note, the patient's wife reports a "TIA" last week,  marked by transient aphasia; she monitored him at home and reports that his condition normalized within minutes.  ED Course: Upon arrival to the ED, patient is found to be afebrile, saturating adequately on room air, and with vital signs stable. EKG features a sinus rhythm with LAD and nonspecific IVCD. Chest x-ray is notable for mild interstitial edema and a small right pleural effusion. Noncontrast head CT is limited by motion but is negative for any gross abnormality. Chemistry panel is notable only for very mild hyponatremia and CBC features a stable mild anemia with hemoglobin 12.6. Lactic acid is elevated to 2.60, troponin is undetectable, and urinalysis is unremarkable. Blood cultures were obtained and the patient was treated with 5 mg Haldol and multiple doses of IV Ativan. He was provided with normal saline infusion. ED physician discussed the case with neurology who advised that the presentation was more consistent with a possible seizure and post seizure agitation rather than CVA. Patient remained hemodynamically stable and not in any apparent respiratory distress, but also disoriented and agitated. He will be observed on the telemetry unit for ongoing evaluation and management of acute encephalopathy.   Interim: MRI revealed acute stroke. Neurology has consulted on patient. EEG was completed with no epileptic foci, mental status improved. Physical therapy and occupational therapy evaluated patient, recommending CIR placement. Cardiology also consulted on patient due to paroxysmal atrial fibrillation with tachycardia. Beta blocker was titrated.   Discharge Diagnoses:  Principal Problem:   Acute encephalopathy Active Problems:   COPD (chronic obstructive pulmonary disease) (HCC)   Benign essential HTN   Type 2 diabetes mellitus with complication, without  long-term current use of insulin (HCC)   Hyponatremia   CKD (chronic kidney disease), stage II   Coronary artery disease involving native  coronary artery of native heart without angina pectoris   Iron deficiency anemia   History of CVA (cerebrovascular accident)   PAF (paroxysmal atrial fibrillation) ([place])   Bacteremia due to Gram-positive bacteria   Altered mental status   Dementia without behavioral disturbance   Leukocytosis   Acute blood loss anemia  Acute encephalopathy likely due to acute stroke and possible seizure as per neurologist -Repeat MRI with stroke  -EEG with no epileptic foci -TSH, ammonia and b12 level unremarkable. -Improved   Acute stroke: repeat MRI showed 4 mm right hippocampal infarct and old left temporal lobe hemorrhage. Pt had CVA in January this year when received TPA.  -MRI/MRA as below -Echo as below  -SLP eval, on dysphagia diet -PT OT recommending CIR  -Continue statin and eliquis -Needs outpatient follow up with vascular surgeon for the evaluation of right ICA stenosis. On medical management now.   Staph epidermidis, micrococcusbacteremia: Likely contaminant. Initially treated with IV vancomycin until the final culture result (pt also with mild fever, leukocytosis and encephalopathy). Since the other culture bottle grew micrococcus it is most likely contaminant. Dr. Carolin Sicks reviewed/consulted with Dr. Tommy Medal from ID on 06/07/2016. He recommended to dc antibiotics. Follow up repeat culture results, negative so far.  -Influenza and respiratory viral panel negative -Continue supportive care.  Probable seizure as per neurologist -Started on keppra. Continue seizure precaution.  Right leg pain -X-ray of right leg pain which was consistent with degenerative changes with no acute finding. Continue rehabilitation.  Paroxysmal atrial fibrillation with tachycardia -On eliquis. Beta blocker per Cardiology   Discharge Instructions   Allergies as of 06/10/2016   No Known Allergies     Medication List    TAKE these medications   acetaminophen 500 MG tablet Commonly known as:   TYLENOL Take 1 tablet (500 mg total) by mouth every 6 (six) hours as needed for mild pain. What changed:  how much to take   apixaban 5 MG Tabs tablet Commonly known as:  ELIQUIS Take 1 tablet (5 mg total) by mouth 2 (two) times daily.   ARTIFICIAL TEARS 1.4 % ophthalmic solution Generic drug:  polyvinyl alcohol Place 1 drop into both eyes daily as needed for dry eyes.   atorvastatin 80 MG tablet Commonly known as:  LIPITOR Take 1 tablet (80 mg total) by mouth daily. What changed:  when to take this   BLUE-EMU MAXIMUM STRENGTH EX Apply 1 application topically at bedtime. Knee pain   citalopram 10 MG tablet Commonly known as:  CELEXA Take 10 mg by mouth daily.   levETIRAcetam 500 MG tablet Commonly known as:  KEPPRA Take 1 tablet (500 mg total) by mouth 2 (two) times daily.   metoprolol tartrate 25 MG tablet Commonly known as:  LOPRESSOR Take 1 tablet (25 mg total) by mouth 2 (two) times daily.   multivitamin with minerals Tabs tablet Take 1 tablet by mouth daily.   nitroGLYCERIN 0.4 MG SL tablet Commonly known as:  NITROSTAT Place 0.4 mg under the tongue every 5 (five) minutes as needed for chest pain.   omeprazole 20 MG capsule Commonly known as:  PRILOSEC Take 20 mg by mouth daily.   tamsulosin 0.4 MG Caps capsule Commonly known as:  FLOMAX Take 0.4 mg by mouth every other day.        Contact information for follow-up providers  Lillard Anes, NP. Schedule an appointment as soon as possible for a visit in 1 week(s).   Specialty:  Family Medicine Contact information: [place] [address] [phone]            Contact information for after-discharge care    Destination    [facility] Follow up.   Specialty:  [place] Contact information: [place] [phone]                 No Known  Allergies  Consultations:  Neurology  Cardiology   Procedures/Studies: Dg Chest 2 View  Result Date: 06/04/2016 CLINICAL DATA:  Evaluate for pneumonia EXAM: CHEST  2 VIEW COMPARISON:  March 09, 2011 FINDINGS: The heart size is enlarged. The mid aorta is tortuous. There is increased pulmonary interstitium bilaterally. There is minimal right pleural effusion. There is no focal pneumonia. The osseous structures are stable. IMPRESSION: Mild interstitial edema.  Small right pleural effusion. Electronically Signed   By: Abelardo Diesel M.D.   On: 06/04/2016 19:15   Dg Knee 1-2 Views Right  Result Date: 06/05/2016 CLINICAL DATA:  Right knee pain. EXAM: RIGHT KNEE - 1-2 VIEW COMPARISON:  11/25/2015. FINDINGS: Marked lateral joint space narrowing with progression and moderate medial joint space narrowing without significant change. Mild to moderate lateral spur formation and moderate spur formation. Small loose body in the suprapatellar bursa. No effusion seen. Interval intramedullary rod in the femur. Atheromatous arterial calcifications. IMPRESSION: Severe lateral compartment degenerative changes with progression and moderate medial compartment degenerative changes without significant change. Electronically Signed   By: Claudie Revering M.D.   On: 06/05/2016 11:49   Ct Head Wo Contrast  Result Date: 06/04/2016 CLINICAL DATA:  Altered mental status EXAM: CT HEAD WITHOUT CONTRAST TECHNIQUE: Contiguous axial images were obtained from the base of the skull through the vertex without intravenous contrast. COMPARISON:  04/12/2016, 03/31/2016, 03/30/2016 FINDINGS: Brain: Examination is significantly degraded by gross patient motion, as well as positioning. No gross hemorrhage or mass is visualized. Periventricular white matter small vessel ischemic changes. Ventricle size grossly stable. Vascular: Carotid artery calcifications Skull: Limited due to motion Sinuses/Orbits: No acute abnormality Other: None IMPRESSION:  Significantly limited exam secondary to patient motion and positioning. No gross acute abnormality is visualized Electronically Signed   By: Donavan Foil M.D.   On: 06/04/2016 17:23   Mr Jodene Nam Head Wo Contrast  Result Date: 06/07/2016 CLINICAL DATA:  Altered and agitated at urologist office today. Confusion. History of hypertension, hyperlipidemia, diabetes and stroke. EXAM: MRI HEAD WITHOUT CONTRAST MRA HEAD WITHOUT CONTRAST TECHNIQUE: Multiplanar, multiecho pulse sequences of the brain and surrounding structures were obtained without intravenous contrast. Angiographic images of the head were obtained using MRA technique without contrast. COMPARISON:  CT HEAD June 04, 2016 and MRI of the head June 04, 2016 in CTA HEAD March 31, 2016 FINDINGS: MRI HEAD FINDINGS BRAIN: 4 mm focus of reduced diffusion RIGHT hippocampus. Due to small size, limited assessment on ADC map. Susceptibility artifact LEFT temporal lobe at site of prior intraparenchymal hematoma. Ventricles and sulci are normal for patient's age. Confluent supratentorial white matter FLAIR T2 hyperintensities. Old LEFT thalamus lacunar infarct. No abnormal extra-axial fluid collections. 7 mm pineal cyst. VASCULAR: Normal major intracranial vascular flow voids present at skull base. SKULL AND UPPER CERVICAL SPINE: No abnormal sellar expansion. No suspicious calvarial bone marrow signal. Craniocervical junction maintained. SINUSES/ORBITS: The mastoid air-cells and included paranasal sinuses are  well-aerated. The included ocular globes and orbital contents are non-suspicious. Status post bilateral ocular lens implants. OTHER: None. MRA HEAD FINDINGS ANTERIOR CIRCULATION: Flow related enhancement of the included cervical, petrous, cavernous and supraclinoid internal carotid arteries. RIGHT tonsillar loop. Mild stenosis bilateral supraclinoid internal carotid artery's. Patent anterior communicating artery. Normal flow related enhancement of the anterior and  middle cerebral arteries, including distal segments. No large vessel occlusion, high-grade stenosis, abnormal luminal irregularity, aneurysm. POSTERIOR CIRCULATION: LEFT vertebral artery is dominant. Basilar artery is patent, with normal flow related enhancement of the main branch vessels. Normal flow related enhancement of the posterior cerebral arteries. No large vessel occlusion, high-grade stenosis, abnormal luminal irregularity, aneurysm. ANATOMIC VARIANTS: Hypoplastic LEFT A1 segment. IMPRESSION: MRI HEAD: 4 mm RIGHT hippocampal infarct is likely acute. Old LEFT temporal lobe hemorrhage. Moderate chronic small vessel ischemic disease. Old LEFT thalamus lacunar infarct. MRA HEAD: No emergent large vessel occlusion or severe stenosis. Electronically Signed   By: Elon Alas M.D.   On: 06/07/2016 20:47   Mr Brain Wo Contrast  Result Date: 06/07/2016 CLINICAL DATA:  Altered and agitated at urologist office today. Confusion. History of hypertension, hyperlipidemia, diabetes and stroke. EXAM: MRI HEAD WITHOUT CONTRAST MRA HEAD WITHOUT CONTRAST TECHNIQUE: Multiplanar, multiecho pulse sequences of the brain and surrounding structures were obtained without intravenous contrast. Angiographic images of the head were obtained using MRA technique without contrast. COMPARISON:  CT HEAD June 04, 2016 and MRI of the head June 04, 2016 in CTA HEAD March 31, 2016 FINDINGS: MRI HEAD FINDINGS BRAIN: 4 mm focus of reduced diffusion RIGHT hippocampus. Due to small size, limited assessment on ADC map. Susceptibility artifact LEFT temporal lobe at site of prior intraparenchymal hematoma. Ventricles and sulci are normal for patient's age. Confluent supratentorial white matter FLAIR T2 hyperintensities. Old LEFT thalamus lacunar infarct. No abnormal extra-axial fluid collections. 7 mm pineal cyst. VASCULAR: Normal major intracranial vascular flow voids present at skull base. SKULL AND UPPER CERVICAL SPINE: No abnormal sellar  expansion. No suspicious calvarial bone marrow signal. Craniocervical junction maintained. SINUSES/ORBITS: The mastoid air-cells and included paranasal sinuses are well-aerated. The included ocular globes and orbital contents are non-suspicious. Status post bilateral ocular lens implants. OTHER: None. MRA HEAD FINDINGS ANTERIOR CIRCULATION: Flow related enhancement of the included cervical, petrous, cavernous and supraclinoid internal carotid arteries. RIGHT tonsillar loop. Mild stenosis bilateral supraclinoid internal carotid artery's. Patent anterior communicating artery. Normal flow related enhancement of the anterior and middle cerebral arteries, including distal segments. No large vessel occlusion, high-grade stenosis, abnormal luminal irregularity, aneurysm. POSTERIOR CIRCULATION: LEFT vertebral artery is dominant. Basilar artery is patent, with normal flow related enhancement of the main branch vessels. Normal flow related enhancement of the posterior cerebral arteries. No large vessel occlusion, high-grade stenosis, abnormal luminal irregularity, aneurysm. ANATOMIC VARIANTS: Hypoplastic LEFT A1 segment. IMPRESSION: MRI HEAD: 4 mm RIGHT hippocampal infarct is likely acute. Old LEFT temporal lobe hemorrhage. Moderate chronic small vessel ischemic disease. Old LEFT thalamus lacunar infarct. MRA HEAD: No emergent large vessel occlusion or severe stenosis. Electronically Signed   By: Elon Alas M.D.   On: 06/07/2016 20:47   Mr Brain Wo Contrast  Result Date: 06/04/2016 CLINICAL DATA:  Acute encephalopathy EXAM: MRI HEAD WITHOUT CONTRAST TECHNIQUE: Multiplanar, multiecho pulse sequences of the brain and surrounding structures were obtained without intravenous contrast. COMPARISON:  Head CT 06/04/2016 FINDINGS: The examination had to be discontinued prior to completion due to patient agitation, fighting to get up scanner. Only axial diffusion weighted imaging was obtained and  that is severely motion  degraded. An axial ADC map was constructed from this data. The diffusion-weighted imaging shows no large area of diffusion restriction. There is no midline shift. IMPRESSION: Severely limited examination without evidence of large acute infarct. Electronically Signed   By: Ulyses Jarred M.D.   On: 06/04/2016 21:43   Dg Hip Unilat With Pelvis 2-3 Views Right  Result Date: 06/05/2016 CLINICAL DATA:  Right hip pain.  No known injury. EXAM: DG HIP (WITH OR WITHOUT PELVIS) 2-3V RIGHT COMPARISON:  11/24/2015 and 11/26/2015. FINDINGS: Hardware fixation of the previously demonstrated comminuted right intertrochanteric fracture with interval bridging callus. No acute fracture or dislocation seen. Marked diffuse osteopenia. Stable left hip prosthesis. Atheromatous arterial calcifications, including the abdominal aorta. IMPRESSION: 1. No acute fracture. 2. Severe osteopenia. 3. Aortic atherosclerosis. Electronically Signed   By: Claudie Revering M.D.   On: 06/05/2016 11:51   Dg Femur, Min 2 Views Right  Result Date: 06/05/2016 CLINICAL DATA:  Right leg pain.  No known injury. EXAM: RIGHT FEMUR 2 VIEWS COMPARISON:  11/26/2015. FINDINGS: Stable compression screw and rod fixation of the previously demonstrated comminuted right intertrochanteric fracture. Interval bridging callus. No acute fracture or dislocation. Atheromatous arterial calcifications. IMPRESSION: No acute fracture or dislocation. Electronically Signed   By: Claudie Revering M.D.   On: 06/05/2016 11:53    Echo 4/7   Study Conclusions  - Left ventricle: The cavity size was normal. Systolic function was   normal. The estimated ejection fraction was in the range of 55%   to 60%. Wall motion was normal; there were no regional wall   motion abnormalities. Doppler parameters are consistent with   abnormal left ventricular relaxation (grade 1 diastolic   dysfunction). There was no evidence of elevated ventricular   filling pressure by Doppler parameters. -  Aortic valve: There was no regurgitation. - Aortic root: The aortic root was normal in size. - Mitral valve: There was trivial regurgitation. - Left atrium: The atrium was mildly dilated. - Right ventricle: Systolic function was normal. - Tricuspid valve: There was trivial regurgitation. - Pulmonary arteries: Systolic pressure was within the normal   range. - Inferior vena cava: The vessel was normal in size. - Pericardium, extracardiac: There was no pericardial effusion.   EEG 4/3 Impression: This awake EEG is abnormal due to moderate diffuse slowing of the waking background.  Clinical Correlation of the above findings indicates diffuse cerebral dysfunction that is non-specific in etiology and can be seen with hypoxic/ischemic injury, toxic/metabolic encephalopathies, neurodegenerative disorders, or medication effect.  The absence of epileptiform discharges does not rule out a clinical diagnosis of epilepsy. Clinical correlation is advised.   Discharge Exam: Vitals:   06/09/16 2205 06/10/16 0435  BP: (!) 107/59 (!) 109/57  Pulse: 77 64  Resp:  18  Temp:  97.1 F (36.2 C)   Vitals:   06/09/16 1650 06/09/16 1946 06/09/16 2205 06/10/16 0435  BP: 117/72 118/63 (!) 107/59 (!) 109/57  Pulse: 85 81 77 64  Resp: 18 18  18   Temp: 98 F (36.7 C) 98.8 F (37.1 C)  97.1 F (36.2 C)  TempSrc: Oral Oral  Axillary  SpO2: 97% 94%  95%  Weight:    74.2 kg (163 lb 8 oz)  Height:        General: Pt is alert, awake, not in acute distress Cardiovascular: Irregular rhythm, rate 60s, S1/S2 +, no rubs, no gallops Respiratory: CTA bilaterally, no wheezing, no rhonchi Abdominal: Soft, NT, ND, bowel sounds +  Extremities: no edema, no cyanosis Neuro:RLE weaker compared to left, speech fluent    The results of significant diagnostics from this hospitalization (including imaging, microbiology, ancillary and laboratory) are listed below for reference.     Microbiology: Recent Results (from  the past 240 hour(s))  Blood Culture (routine x 2)     Status: Abnormal   Collection Time: 06/04/16  4:30 PM  Result Value Ref Range Status   Specimen Description BLOOD RIGHT ANTECUBITAL  Final   Special Requests   Final    BOTTLES DRAWN AEROBIC ONLY Blood Culture adequate volume   Culture  Setup Time   Final    GRAM POSITIVE COCCI IN CLUSTERS AEROBIC BOTTLE ONLY CRITICAL VALUE NOTED.  VALUE IS CONSISTENT WITH PREVIOUSLY REPORTED AND CALLED VALUE.    Culture (A)  Final    MICROCOCCUS LUTEUS/LYLAE Standardized susceptibility testing for this organism is not available.    Report Status 06/07/2016 FINAL  Final  Blood Culture (routine x 2)     Status: Abnormal   Collection Time: 06/04/16  4:35 PM  Result Value Ref Range Status   Specimen Description BLOOD LEFT FOREARM  Final   Special Requests   Final    BOTTLES DRAWN AEROBIC ONLY Blood Culture adequate volume   Culture  Setup Time   Final    GRAM POSITIVE COCCI IN CLUSTERS AEROBIC BOTTLE ONLY CRITICAL RESULT CALLED TO, READ BACK BY AND VERIFIED WITH: P. Dang Pharm.D. 16:30 06/05/16 ([email or web address])    Culture (A)  Final    STAPHYLOCOCCUS EPIDERMIDIS THE SIGNIFICANCE OF ISOLATING THIS ORGANISM FROM A SINGLE SET OF BLOOD CULTURES WHEN MULTIPLE SETS ARE DRAWN IS UNCERTAIN. PLEASE NOTIFY THE MICROBIOLOGY DEPARTMENT WITHIN ONE WEEK IF SPECIATION AND SENSITIVITIES ARE REQUIRED.    Report Status 06/07/2016 FINAL  Final  Blood Culture ID Panel (Reflexed)     Status: Abnormal   Collection Time: 06/04/16  4:35 PM  Result Value Ref Range Status   Enterococcus species NOT DETECTED NOT DETECTED Final   Listeria monocytogenes NOT DETECTED NOT DETECTED Final   Staphylococcus species DETECTED (A) NOT DETECTED Final    Comment: Methicillin (oxacillin) resistant coagulase negative staphylococcus. Possible blood culture contaminant (unless isolated from more than one blood culture draw or clinical case suggests pathogenicity). No antibiotic treatment is  indicated for blood  culture contaminants. CRITICAL RESULT CALLED TO, READ BACK BY AND VERIFIED WITH: P. Dang Pharm.D. 16:30 06/05/16 ([email or web address])    Staphylococcus aureus NOT DETECTED NOT DETECTED Final   Methicillin resistance DETECTED (A) NOT DETECTED Final    Comment: CRITICAL RESULT CALLED TO, READ BACK BY AND VERIFIED WITH: P. Dang Pharm.D. 16:30 06/05/16 ([email or web address])    Streptococcus species NOT DETECTED NOT DETECTED Final   Streptococcus agalactiae NOT DETECTED NOT DETECTED Final   Streptococcus pneumoniae NOT DETECTED NOT DETECTED Final   Streptococcus pyogenes NOT DETECTED NOT DETECTED Final   Acinetobacter baumannii NOT DETECTED NOT DETECTED Final   Enterobacteriaceae species NOT DETECTED NOT DETECTED Final   Enterobacter cloacae complex NOT DETECTED NOT DETECTED Final   Escherichia coli NOT DETECTED NOT DETECTED Final   Klebsiella oxytoca NOT DETECTED NOT DETECTED Final   Klebsiella pneumoniae NOT DETECTED NOT DETECTED Final   Proteus species NOT DETECTED NOT DETECTED Final   Serratia marcescens NOT DETECTED NOT DETECTED Final   Haemophilus influenzae NOT DETECTED NOT DETECTED Final   Neisseria meningitidis NOT DETECTED NOT DETECTED Final   Pseudomonas aeruginosa NOT DETECTED NOT DETECTED Final   Candida albicans NOT DETECTED NOT DETECTED Final  Candida glabrata NOT DETECTED NOT DETECTED Final   Candida krusei NOT DETECTED NOT DETECTED Final   Candida parapsilosis NOT DETECTED NOT DETECTED Final   Candida tropicalis NOT DETECTED NOT DETECTED Final  Respiratory Panel by PCR     Status: None   Collection Time: 06/05/16 12:10 PM  Result Value Ref Range Status   Adenovirus NOT DETECTED NOT DETECTED Final   Coronavirus 229E NOT DETECTED NOT DETECTED Final   Coronavirus HKU1 NOT DETECTED NOT DETECTED Final   Coronavirus NL63 NOT DETECTED NOT DETECTED Final   Coronavirus OC43 NOT DETECTED NOT DETECTED Final   Metapneumovirus NOT DETECTED NOT DETECTED Final   Rhinovirus /  Enterovirus NOT DETECTED NOT DETECTED Final   Influenza A NOT DETECTED NOT DETECTED Final   Influenza B NOT DETECTED NOT DETECTED Final   Parainfluenza Virus 1 NOT DETECTED NOT DETECTED Final   Parainfluenza Virus 2 NOT DETECTED NOT DETECTED Final   Parainfluenza Virus 3 NOT DETECTED NOT DETECTED Final   Parainfluenza Virus 4 NOT DETECTED NOT DETECTED Final   Respiratory Syncytial Virus NOT DETECTED NOT DETECTED Final   Bordetella pertussis NOT DETECTED NOT DETECTED Final   Chlamydophila pneumoniae NOT DETECTED NOT DETECTED Final   Mycoplasma pneumoniae NOT DETECTED NOT DETECTED Final  Culture, blood (routine x 2)     Status: None (Preliminary result)   Collection Time: 06/05/16  7:04 PM  Result Value Ref Range Status   Specimen Description BLOOD LEFT ANTECUBITAL  Final   Special Requests   Final    BOTTLES DRAWN AEROBIC AND ANAEROBIC Blood Culture results may not be optimal due to an excessive volume of blood received in culture bottles   Culture NO GROWTH 4 DAYS  Final   Report Status PENDING  Incomplete  Culture, blood (routine x 2)     Status: None (Preliminary result)   Collection Time: 06/05/16  7:06 PM  Result Value Ref Range Status   Specimen Description BLOOD LEFT HAND  Final   Special Requests   Final    BOTTLES DRAWN AEROBIC ONLY Blood Culture results may not be optimal due to an excessive volume of blood received in culture bottles   Culture NO GROWTH 4 DAYS  Final   Report Status PENDING  Incomplete     Labs: BNP (last 3 results) No results for input(s): BNP in the last 8760 hours. Basic Metabolic Panel:  Recent Labs Lab 06/04/16 1630 06/04/16 2001 06/05/16 0137 06/07/16 0237 06/08/16 0212 06/09/16 0159  NA 134*  --  133* 131* 129* 130*  K 3.7  --  3.7 3.4* 4.3 4.0  CL 101  --  103 102 100* 101  CO2 22  --  21* 20* 21* 21*  GLUCOSE 98  --  106* 112* 108* 111*  BUN 11  --  10 7 13 13   CREATININE 1.01  --  0.93 0.82 0.82 0.86  CALCIUM 9.2  --  8.7* 8.4*  8.5* 8.5*  MG  --  1.6*  --  1.8  --   --    Liver Function Tests:  Recent Labs Lab 06/04/16 1630  AST 40  ALT 17  ALKPHOS 77  BILITOT 0.9  PROT 6.8  ALBUMIN 3.6   No results for input(s): LIPASE, AMYLASE in the last 168 hours.  Recent Labs Lab 06/05/16 1450  AMMONIA 37*   CBC:  Recent Labs Lab 06/04/16 1630 06/04/16 2139 06/05/16 0137 06/07/16 0237 06/08/16 0212 06/09/16 0159  WBC 8.2  --  11.2* 11.4*  13.1* 10.7*  NEUTROABS 5.4  --  8.6*  --   --   --   HGB 12.6*  --  11.5* 11.5* 11.4* 10.8*  HCT 37.6* 35.8* 34.3* 34.8* 34.6* 33.5*  MCV 89.7  --  89.6 90.2 90.8 90.8  PLT 350  --  320 318 315 369   Cardiac Enzymes:  Recent Labs Lab 06/04/16 1630  TROPONINI <0.03   BNP: Invalid input(s): POCBNP CBG:  Recent Labs Lab 06/04/16 1753 06/06/16 0800 06/10/16 0557  GLUCAP 97 131* 96   D-Dimer No results for input(s): DDIMER in the last 72 hours. Hgb A1c No results for input(s): HGBA1C in the last 72 hours. Lipid Profile No results for input(s): CHOL, HDL, LDLCALC, TRIG, CHOLHDL, LDLDIRECT in the last 72 hours. Thyroid function studies No results for input(s): TSH, T4TOTAL, T3FREE, THYROIDAB in the last 72 hours.  Invalid input(s): FREET3 Anemia work up No results for input(s): VITAMINB12, FOLATE, FERRITIN, TIBC, IRON, RETICCTPCT in the last 72 hours. Urinalysis    Component Value Date/Time   COLORURINE YELLOW 06/04/2016 1618   APPEARANCEUR CLEAR 06/04/2016 1618   LABSPEC 1.009 06/04/2016 1618   PHURINE 8.0 06/04/2016 1618   GLUCOSEU NEGATIVE 06/04/2016 1618   HGBUR NEGATIVE 06/04/2016 1618   BILIRUBINUR NEGATIVE 06/04/2016 1618   BILIRUBINUR negative 05/31/2016 1006   KETONESUR 5 (A) 06/04/2016 1618   PROTEINUR NEGATIVE 06/04/2016 1618   UROBILINOGEN 0.2 05/31/2016 1006   UROBILINOGEN 0.2 08/31/2011 1033   NITRITE NEGATIVE 06/04/2016 1618   LEUKOCYTESUR NEGATIVE 06/04/2016 1618   Sepsis Labs Invalid input(s): PROCALCITONIN,  WBC,   LACTICIDVEN Microbiology Recent Results (from the past 240 hour(s))  Blood Culture (routine x 2)     Status: Abnormal   Collection Time: 06/04/16  4:30 PM  Result Value Ref Range Status   Specimen Description BLOOD RIGHT ANTECUBITAL  Final   Special Requests   Final    BOTTLES DRAWN AEROBIC ONLY Blood Culture adequate volume   Culture  Setup Time   Final    GRAM POSITIVE COCCI IN CLUSTERS AEROBIC BOTTLE ONLY CRITICAL VALUE NOTED.  VALUE IS CONSISTENT WITH PREVIOUSLY REPORTED AND CALLED VALUE.    Culture (A)  Final    MICROCOCCUS LUTEUS/LYLAE Standardized susceptibility testing for this organism is not available.    Report Status 06/07/2016 FINAL  Final  Blood Culture (routine x 2)     Status: Abnormal   Collection Time: 06/04/16  4:35 PM  Result Value Ref Range Status   Specimen Description BLOOD LEFT FOREARM  Final   Special Requests   Final    BOTTLES DRAWN AEROBIC ONLY Blood Culture adequate volume   Culture  Setup Time   Final    GRAM POSITIVE COCCI IN CLUSTERS AEROBIC BOTTLE ONLY CRITICAL RESULT CALLED TO, READ BACK BY AND VERIFIED WITH: P. Dang Pharm.D. 16:30 06/05/16 ([email or web address])    Culture (A)  Final    STAPHYLOCOCCUS EPIDERMIDIS THE SIGNIFICANCE OF ISOLATING THIS ORGANISM FROM A SINGLE SET OF BLOOD CULTURES WHEN MULTIPLE SETS ARE DRAWN IS UNCERTAIN. PLEASE NOTIFY THE MICROBIOLOGY DEPARTMENT WITHIN ONE WEEK IF SPECIATION AND SENSITIVITIES ARE REQUIRED.    Report Status 06/07/2016 FINAL  Final  Blood Culture ID Panel (Reflexed)     Status: Abnormal   Collection Time: 06/04/16  4:35 PM  Result Value Ref Range Status   Enterococcus species NOT DETECTED NOT DETECTED Final   Listeria monocytogenes NOT DETECTED NOT DETECTED Final   Staphylococcus species DETECTED (A) NOT DETECTED Final    Comment:  Methicillin (oxacillin) resistant coagulase negative staphylococcus. Possible blood culture contaminant (unless isolated from more than one blood culture draw or clinical case suggests  pathogenicity). No antibiotic treatment is indicated for blood  culture contaminants. CRITICAL RESULT CALLED TO, READ BACK BY AND VERIFIED WITH: P. Dang Pharm.D. 16:30 06/05/16 ([email or web address])    Staphylococcus aureus NOT DETECTED NOT DETECTED Final   Methicillin resistance DETECTED (A) NOT DETECTED Final    Comment: CRITICAL RESULT CALLED TO, READ BACK BY AND VERIFIED WITH: P. Dang Pharm.D. 16:30 06/05/16 ([email or web address])    Streptococcus species NOT DETECTED NOT DETECTED Final   Streptococcus agalactiae NOT DETECTED NOT DETECTED Final   Streptococcus pneumoniae NOT DETECTED NOT DETECTED Final   Streptococcus pyogenes NOT DETECTED NOT DETECTED Final   Acinetobacter baumannii NOT DETECTED NOT DETECTED Final   Enterobacteriaceae species NOT DETECTED NOT DETECTED Final   Enterobacter cloacae complex NOT DETECTED NOT DETECTED Final   Escherichia coli NOT DETECTED NOT DETECTED Final   Klebsiella oxytoca NOT DETECTED NOT DETECTED Final   Klebsiella pneumoniae NOT DETECTED NOT DETECTED Final   Proteus species NOT DETECTED NOT DETECTED Final   Serratia marcescens NOT DETECTED NOT DETECTED Final   Haemophilus influenzae NOT DETECTED NOT DETECTED Final   Neisseria meningitidis NOT DETECTED NOT DETECTED Final   Pseudomonas aeruginosa NOT DETECTED NOT DETECTED Final   Candida albicans NOT DETECTED NOT DETECTED Final   Candida glabrata NOT DETECTED NOT DETECTED Final   Candida krusei NOT DETECTED NOT DETECTED Final   Candida parapsilosis NOT DETECTED NOT DETECTED Final   Candida tropicalis NOT DETECTED NOT DETECTED Final  Respiratory Panel by PCR     Status: None   Collection Time: 06/05/16 12:10 PM  Result Value Ref Range Status   Adenovirus NOT DETECTED NOT DETECTED Final   Coronavirus 229E NOT DETECTED NOT DETECTED Final   Coronavirus HKU1 NOT DETECTED NOT DETECTED Final   Coronavirus NL63 NOT DETECTED NOT DETECTED Final   Coronavirus OC43 NOT DETECTED NOT DETECTED Final   Metapneumovirus NOT DETECTED  NOT DETECTED Final   Rhinovirus / Enterovirus NOT DETECTED NOT DETECTED Final   Influenza A NOT DETECTED NOT DETECTED Final   Influenza B NOT DETECTED NOT DETECTED Final   Parainfluenza Virus 1 NOT DETECTED NOT DETECTED Final   Parainfluenza Virus 2 NOT DETECTED NOT DETECTED Final   Parainfluenza Virus 3 NOT DETECTED NOT DETECTED Final   Parainfluenza Virus 4 NOT DETECTED NOT DETECTED Final   Respiratory Syncytial Virus NOT DETECTED NOT DETECTED Final   Bordetella pertussis NOT DETECTED NOT DETECTED Final   Chlamydophila pneumoniae NOT DETECTED NOT DETECTED Final   Mycoplasma pneumoniae NOT DETECTED NOT DETECTED Final  Culture, blood (routine x 2)     Status: None (Preliminary result)   Collection Time: 06/05/16  7:04 PM  Result Value Ref Range Status   Specimen Description BLOOD LEFT ANTECUBITAL  Final   Special Requests   Final    BOTTLES DRAWN AEROBIC AND ANAEROBIC Blood Culture results may not be optimal due to an excessive volume of blood received in culture bottles   Culture NO GROWTH 4 DAYS  Final   Report Status PENDING  Incomplete  Culture, blood (routine x 2)     Status: None (Preliminary result)   Collection Time: 06/05/16  7:06 PM  Result Value Ref Range Status   Specimen Description BLOOD LEFT HAND  Final   Special Requests   Final    BOTTLES DRAWN AEROBIC ONLY Blood Culture results may not be optimal due to  an excessive volume of blood received in culture bottles   Culture NO GROWTH 4 DAYS  Final   Report Status PENDING  Incomplete     Time coordinating discharge: 45 minutes  SIGNED:  Dessa Phi, DO [facility] Pager [phone]  If 7PM-7AM, please contact night-coverage [email or web address] Password [email or web address] 06/10/2016, 1:58 PM

## 2016-06-10 NOTE — H&P (Signed)
Physical Medicine and Rehabilitation Admission H&P    Chief Complaint  Patient presents with  . Altered Mental Status    HPI:  Herbert Marquez is a 83 y.o. 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 History taken from chart review and wife. He was making good progress at home and had completed HHPT. He was admitted on 06/04/16 via urology office with facial changes followed by mental status changes with agitation. MRI brain reviewed, but limited due to motion per report, secondary to severe agitation and  negative for large acute infarct. EEG done revealing moderate diffuse slowing. UA negative. Blood cultures with Staph epidermidis thought to be contaminant, but started on IV Vanc due to fevers and encephalopathy.  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   Neurology recommended continuing Eliquis for secondary stroke prevention and stroke embolic due ot CVA or carotid stenosis Dr. Shon Hale felt that encephalopathy due to seizure as patient at high risk from his recent temporal lobe infarct and recommended Keppra for treatment. Antibiotics discontinued as staph epidermis + BC felt to be due to contaminant. Pt noted to be tachycardic and evaluated by Cardiology, who increased beta blocker and cleared for admission to inpatient rehab 4/7. EEG performed showing Grade 1 diastolic dysfunction. Mentation improving and patient showing improvement in activity tolerance. CIR recommended for follow up therapy.   Review of Systems  HENT: Positive for hearing loss.   Eyes: Negative for blurred vision and double vision.  Respiratory: Negative for shortness of breath and wheezing.   Cardiovascular: Negative for chest pain and leg swelling.  Gastrointestinal: Negative  for diarrhea, heartburn and nausea.  Genitourinary: Positive for dysuria. Negative for frequency and urgency.  Musculoskeletal: Positive for joint pain and myalgias.  Neurological: Positive for focal weakness and weakness. Negative for dizziness and headaches.  Psychiatric/Behavioral: Positive for memory loss. Negative for depression. The patient is not nervous/anxious.   All other systems reviewed and are negative.   Past Medical History:  Diagnosis Date  . Arthritis    "pretty much all over"   . Basal cell carcinoma    "several burned off his body, face, head"  . BPH (benign prostatic hypertrophy)   . Coronary artery disease    a. s/p PCI of RCA in 2006  . CVA (cerebral infarction)    a. 06/2015: left thalamic and bilateral PCA  . GERD (gastroesophageal reflux disease)   . Hyperlipidemia   . Hypertension   . TIA (transient ischemic attack)    Approximately 6 weeks post-cardiac catheterization.   . Type II diabetes mellitus ([place])    "prediabetic; lost alot of weight; not diabetic now" (06/16/2015)    Past Surgical History:  Procedure Laterality Date  . CARDIOVASCULAR STRESS TEST  07/01/2007   EF 74%  . CATARACT EXTRACTION, BILATERAL    . CORONARY ANGIOPLASTY WITH STENT PLACEMENT  10/2004   stenting x 2 to RCA  . FEMUR IM NAIL Right 11/26/2015   Procedure: INTRAMEDULLARY RIGHT (IM) NAIL FEMORAL;  Surgeon: Rod Can, MD;  Location: [facility];  Service: Orthopedics;  Laterality: Right;  . HERNIA REPAIR    . HIP ARTHROPLASTY  03/09/2011   Procedure: ARTHROPLASTY BIPOLAR HIP;  Surgeon: Mauri Pole;  Location: [facility];  Service: Orthopedics;  Laterality: Left;  . LAPAROSCOPIC INCISIONAL / UMBILICAL / VENTRAL HERNIA REPAIR     "below his naval"    Family History  Problem Relation Age of Onset  . Hypertension Mother   . Lung cancer Father   . Lung cancer Brother     Social History:  Married.  Required supervision due to gait disorder and dementia. Per  reports that he quit  smoking about 45 years ago. He has never used smokeless tobacco. He reports that he drinks about 4.8 oz of alcohol per week . He reports that he does not use drugs.   Allergies: No Known Allergies    Medications Prior to Admission  Medication Sig Dispense Refill  . acetaminophen (TYLENOL) 500 MG tablet Take 1 tablet (500 mg total) by mouth every 6 (six) hours as needed for mild pain. (Patient taking differently: Take 1,000 mg by mouth every 6 (six) hours as needed for mild pain. ) 30 tablet 0  . apixaban (ELIQUIS) 5 MG TABS tablet Take 1 tablet (5 mg total) by mouth 2 (two) times daily. 60 tablet 1  . atorvastatin (LIPITOR) 80 MG tablet Take 1 tablet (80 mg total) by mouth daily. (Patient taking differently: Take 80 mg by mouth at bedtime. ) 90 tablet 0  . citalopram (CELEXA) 10 MG tablet Take 10 mg by mouth daily.    [name] levETIRAcetam (KEPPRA) 500 MG tablet Take 1 tablet (500 mg total) by mouth 2 (two) times daily. 60 tablet 0  . Menthol, Topical Analgesic, (BLUE-EMU MAXIMUM STRENGTH EX) Apply 1 application topically at bedtime. Knee pain    . metoprolol tartrate (LOPRESSOR) 25 MG tablet Take 1 tablet (25 mg total) by mouth 2 (two) times daily. 60 tablet 0  . Multiple Vitamin (MULTIVITAMIN WITH MINERALS) TABS tablet Take 1 tablet by mouth daily.    . nitroGLYCERIN (NITROSTAT) 0.4 MG SL tablet Place 0.4 mg under the tongue every 5 (five) minutes as needed for chest pain.    [name] omeprazole (PRILOSEC) 20 MG capsule Take 20 mg by mouth daily.    . polyvinyl alcohol (ARTIFICIAL TEARS) 1.4 % ophthalmic solution Place 1 drop into both eyes daily as needed for dry eyes.    . tamsulosin (FLOMAX) 0.4 MG CAPS capsule Take 0.4 mg by mouth every other day.       Home: Home Living Family/patient expects to be discharged to:: Private residence Living Arrangements: Spouse/significant other Available Help at Discharge: Family, Available 24 hours/day Type of Home: [place]: One level Bathroom  Shower/Tub: Multimedia programmer: Handicapped height Bathroom Accessibility: Yes Home Equipment: Environmental consultant - 2 wheels, [place] - single point, Wheelchair - manual, Systems analyst, Shower seat, Grab bars - tub/shower Additional Comments: Spent a long time talking with pt and family about plans.  pt was home with [facility] services - he was walking with RW and wife following with wheelchair. He was able to transfer with wifes SBA only.   Functional History: Prior Function Level of Independence: Independent with assistive device(s) Comments: mainly at w/c level at home but able to transfer to toilet and shower on his own. bathes and dresses with increased time; wife assists if they are in a hurry.  Functional Status:  Mobility: Bed Mobility Overal bed mobility: Needs Assistance Bed Mobility: Sit to Sidelying Rolling: Supervision Supine to sit: Min assist Sit to supine: Max assist Sit to sidelying: Mod assist General bed mobility comments: VCs for sequencing of where to put and keep hands has he came  down on his right side and then rolled over onto his back Transfers Overall transfer level: Needs assistance Equipment used: Rolling walker (2 wheeled) Transfers: Sit to/from Stand Sit to Stand: Max assist (from recliner) Stand pivot transfers:  (started out Mod A as he turned, then max A as he stepped backwards) General transfer comment: VCs for safe hand placement, increased time once in standing to get full upright posture (pt flexed and leaning to left) Ambulation/Gait Ambulation/Gait assistance: Mod assist, +2 safety/equipment Ambulation Distance (Feet): 26 Feet Assistive device: Rolling walker (2 wheeled) Gait Pattern/deviations: Step-to pattern, Decreased stride length, Trunk flexed General Gait Details: pt with left trunk rotation, decreased stance on RLE, assist to control and advance RW, flexed trunk, cues for sequence and safety with chair to follow. Pt walked 26' then 22' with  seated rest Gait velocity interpretation: Below normal speed for age/gender    ADL: ADL Overall ADL's : Needs assistance/impaired Eating/Feeding: Minimal assistance, Sitting Grooming: Moderate assistance, Sitting Upper Body Bathing: Moderate assistance, Sitting Lower Body Bathing: Maximal assistance Upper Body Dressing : Minimal assistance, Sitting Lower Body Dressing: Maximal assistance Toilet Transfer: Moderate assistance, Stand-pivot, RW General ADL Comments: Pt declining transfers at this time; agreeable to sit EOB but reports he is in a lot of pain and tired.  Cognition: Cognition Overall Cognitive Status: Within Functional Limits for tasks assessed Orientation Level: Oriented to person, Oriented to place, Oriented to time, Oriented to situation Cognition Arousal/Alertness: Awake/alert Behavior During Therapy: WFL for tasks assessed/performed Overall Cognitive Status: Within Functional Limits for tasks assessed Area of Impairment: Orientation Orientation Level: Time, Place General Comments: pt alert, following commands and disoriented to place and month  Physical Exam: Blood pressure (!) 109/57, pulse 64, temperature 97.1 F (36.2 C), temperature source Axillary, resp. rate 18, height 5' 7"  (1.702 m), weight 74.2 kg (163 lb 8 oz), SpO2 95 %. Physical Exam  Nursing note and vitals reviewed. Constitutional: He appears well-developed and well-nourished.  Elderly male.  HENT:  Head: Normocephalic and atraumatic.  Mouth/Throat: Oropharynx is clear and moist.  Eyes: Conjunctivae and EOM are normal. Pupils are equal, round, and reactive to light.  Neck: Normal range of motion. Neck supple.  Cardiovascular: Normal rate and regular rhythm.   Respiratory: Effort normal and breath sounds normal. No stridor. No respiratory distress. He has no wheezes.  GI: Soft. Bowel sounds are normal. He exhibits no distension. There is no tenderness.  Musculoskeletal: He exhibits no edema or  tenderness.  Neurological: He is alert.  HOH Motor: RUE: 3+/5 proximal to distal with ataxia RLE: HF, KE 3-/5, ADF/PF 4/5 LUE/LLE: 4/5 proximal to distal  Skin: Skin is warm and dry.  Psychiatric: He has a normal mood and affect. His behavior is normal.    Results for orders placed or performed during the hospital encounter of 06/04/16 (from the past 48 hour(s))  CBC     Status: Abnormal   Collection Time: 06/09/16  1:59 AM  Result Value Ref Range   WBC 10.7 (H) 4.0 - 10.5 K/uL   RBC 3.69 (L) 4.22 - 5.81 MIL/uL   Hemoglobin 10.8 (L) 13.0 - 17.0 g/dL   HCT 33.5 (L) 39.0 - 52.0 %   MCV 90.8 78.0 - 100.0 fL   MCH 29.3 26.0 - 34.0 pg   MCHC 32.2 30.0 - 36.0 g/dL   RDW 15.3 11.5 - 15.5 %   Platelets 369 150 - 400 K/uL  Basic metabolic panel     Status: Abnormal   Collection  Time: 06/09/16  1:59 AM  Result Value Ref Range   Sodium 130 (L) 135 - 145 mmol/L   Potassium 4.0 3.5 - 5.1 mmol/L   Chloride 101 101 - 111 mmol/L   CO2 21 (L) 22 - 32 mmol/L   Glucose, Bld 111 (H) 65 - 99 mg/dL   BUN 13 6 - 20 mg/dL   Creatinine, Ser 0.86 0.61 - 1.24 mg/dL   Calcium 8.5 (L) 8.9 - 10.3 mg/dL   GFR calc non Af Amer >60 >60 mL/min   GFR calc Af Amer >60 >60 mL/min    Comment: (NOTE) The eGFR has been calculated using the CKD EPI equation. This calculation has not been validated in all clinical situations. eGFR's persistently <60 mL/min signify possible Chronic Kidney Disease.    Anion gap 8 5 - 15  Glucose, capillary     Status: None   Collection Time: 06/10/16  5:57 AM  Result Value Ref Range   Glucose-Capillary 96 65 - 99 mg/dL   No results found.  Medical Problem List and Plan: 1.  Weakness, gait abnormality, dysphagia, limitations in self-care secondary to right hippocampal infarct with recent left thalamic CVA with SDH. 2.  DVT Prophylaxis/Anticoagulation: Pharmaceutical: Other (comment)--Eliquis 3. Pain Management: Continue Voltaren gel for knee pain.  4. Mood: Wife supportive  and is staying to provide psychological support.  5. Neuropsych: This patient is not  capable of making decisions on his own behalf. 6. Skin/Wound Care: Routine pressure relief measures.  7. Fluids/Electrolytes/Nutrition: Monitor I/O. Offer supplements between meals  8. A fib with RVR: Monitor HR bid--continue Eliquis, metoprolol. 9.  Leucocytosis: Continue to monitor for signs of infection.  10. GERD: Managed by Protonix.  11. Hyponatremia: Progressive drop in Na--may need fluid restriction. Follow up in am. 12. Seizure prophylaxis: started on Keppra--monitor for tolerance.  13. Diastolic dysfunction: Monitor signs/symptoms of fluid overload. 14. Hyponatremia: Follow BMP 15. ABLA: Follow CBC  Post Admission Physician Evaluation: 1. Preadmission assessment reviewed and changes made below. 2. Functional deficits secondary  to right hippocampal infarct with recent left thalamic CVA with SDH. 3. Patient is admitted to receive collaborative, interdisciplinary care between the physiatrist, rehab nursing staff, and therapy team. 4. Patient's level of medical complexity and substantial therapy needs in context of that medical necessity cannot be provided at a lesser intensity of care such as a SNF. 5. Patient has experienced substantial functional loss from his/her baseline which was documented above under the "Functional History" and "Functional Status" headings.  Judging by the patient's diagnosis, physical exam, and functional history, the patient has potential for functional progress which will result in measurable gains while on inpatient rehab.  These gains will be of substantial and practical use upon discharge  in facilitating mobility and self-care at the household level. 6. Physiatrist will provide 24 hour management of medical needs as well as oversight of the therapy plan/treatment and provide guidance as appropriate regarding the interaction of the two. 7. The Preadmission Screening has  been reviewed and patient status is unchanged unless otherwise stated above. 8. 24 hour rehab nursing will assist with bladder management, safety, disease management, medication administration and patient education  and help integrate therapy concepts, techniques,education, etc. 9. PT will assess and treat for/with: Lower extremity strength, range of motion, stamina, balance, functional mobility, safety, adaptive techniques and equipment, coping skills, pain control, education.   Goals are: Min A. 10. OT will assess and treat for/with: ADL's, functional mobility, safety, upper extremity strength, adaptive techniques  and equipment, ego support, and community reintegration.   Goals are: Min A. Therapy may proceed with showering this patient. 11. SLP will assess and treat for/with: speech, swallowing, cognition.  Goals are: Supervision/Min A. 12. Case Management and Social Worker will assess and treat for psychological issues and discharge planning. 32. Team conference will be held weekly to assess progress toward goals and to determine barriers to discharge. 14. Patient will receive at least 3 hours of therapy per day at least 5 days per week. 15. ELOS: 10-14 days.       16. Prognosis:  good  Delice Lesch, MD, Mellody Drown 06/10/16 Christianna Belmonte Lorie Phenix, MD 06/10/2016

## 2016-06-10 NOTE — Progress Notes (Signed)
Rehab admissions - Patient was seen by cardiology and beta blocker was increased due to tachycardia.  I do have clearance from cardiology and attending MD for acute inpatient rehab admission for today.  Patient was not admitted yesterday due to has episode of tachycardia.  Bed available on rehab and will admit today.  Call me for questions.  #[phone]

## 2016-06-10 NOTE — Progress Notes (Signed)
Subjective:  Feeling better with less tachycardia.  Mild delirium when he first woke up.  Objective:  Vital Signs in the last 24 hours: BP (!) 109/57 (BP Location: Right Arm)   Pulse 64   Temp 97.1 F (36.2 C) (Axillary)   Resp 18   Ht 5\' 7"  (1.702 m)   Wt 74.2 kg (163 lb 8 oz)   SpO2 95%   BMI 25.61 kg/m   Physical Exam: Elderly thin male in no acute distress Lungs:  Clear Cardiac:  Regular rhythm, normal S1 and S2, no S3 Extremities:  No edema present  Intake/Output from previous day: 04/07 0701 - 04/08 0700 In: 6 [I.V.:6] Out: 1900 [Urine:1900]  Weight Filed Weights   06/08/16 0427 06/09/16 0700 06/10/16 0435  Weight: 73.1 kg (161 lb 3.2 oz) 73.8 kg (162 lb 12.8 oz) 74.2 kg (163 lb 8 oz)    Lab Results: Basic Metabolic Panel:  Recent Labs  06/08/16 0212 06/09/16 0159  NA 129* 130*  K 4.3 4.0  CL 100* 101  CO2 21* 21*  GLUCOSE 108* 111*  BUN 13 13  CREATININE 0.82 0.86   CBC:  Recent Labs  06/08/16 0212 06/09/16 0159  WBC 13.1* 10.7*  HGB 11.4* 10.8*  HCT 34.6* 33.5*  MCV 90.8 90.8  PLT 315 369   Telemetry: personally reviewed.  Sinus rhythm with PVC's  Assessment/Plan:  1.  Paroxysmal atrial fibrillation currently settled down and tachycardia settled down with higher beta blocker 2.  Recent stroke 3.  CAD with previous stenting stable 4.  Hypertensive heart disease   recommendations:  Telemetry reviewed.  Okay to go to rehabilitation on a higher dose of beta blocker.   Kerry Hough  MD [facility] Card[facility]  06/10/2016, 11:29 AM

## 2016-06-11 ENCOUNTER — Inpatient Hospital Stay (HOSPITAL_COMMUNITY): Payer: Medicare Other | Admitting: Speech Pathology

## 2016-06-11 ENCOUNTER — Inpatient Hospital Stay (HOSPITAL_COMMUNITY): Payer: Medicare Other | Admitting: Occupational Therapy

## 2016-06-11 ENCOUNTER — Inpatient Hospital Stay (HOSPITAL_COMMUNITY): Payer: Medicare Other

## 2016-06-11 DIAGNOSIS — I69398 Other sequelae of cerebral infarction: Secondary | ICD-10-CM

## 2016-06-11 DIAGNOSIS — R269 Unspecified abnormalities of gait and mobility: Secondary | ICD-10-CM

## 2016-06-11 DIAGNOSIS — I63431 Cerebral infarction due to embolism of right posterior cerebral artery: Secondary | ICD-10-CM

## 2016-06-11 LAB — CBC
HCT: 34.1 % — ABNORMAL LOW (ref 39.0–52.0)
Hemoglobin: 11.8 g/dL — ABNORMAL LOW (ref 13.0–17.0)
MCH: 31.1 pg (ref 26.0–34.0)
MCHC: 34.6 g/dL (ref 30.0–36.0)
MCV: 90 fL (ref 78.0–100.0)
Platelets: 358 10*3/uL (ref 150–400)
RBC: 3.79 MIL/uL — ABNORMAL LOW (ref 4.22–5.81)
RDW: 15.3 % (ref 11.5–15.5)
WBC: 7.7 10*3/uL (ref 4.0–10.5)

## 2016-06-11 LAB — CBC WITH DIFFERENTIAL/PLATELET
Basophils Absolute: 0 10*3/uL (ref 0.0–0.1)
Basophils Relative: 0 %
Eosinophils Absolute: 0.4 10*3/uL (ref 0.0–0.7)
Eosinophils Relative: 5 %
HCT: 34.3 % — ABNORMAL LOW (ref 39.0–52.0)
Hemoglobin: 11.6 g/dL — ABNORMAL LOW (ref 13.0–17.0)
Lymphocytes Relative: 27 %
Lymphs Abs: 2 10*3/uL (ref 0.7–4.0)
MCH: 30.6 pg (ref 26.0–34.0)
MCHC: 33.8 g/dL (ref 30.0–36.0)
MCV: 90.5 fL (ref 78.0–100.0)
Monocytes Absolute: 0.7 10*3/uL (ref 0.1–1.0)
Monocytes Relative: 9 %
Neutro Abs: 4.2 10*3/uL (ref 1.7–7.7)
Neutrophils Relative %: 59 %
Platelets: 441 10*3/uL — ABNORMAL HIGH (ref 150–400)
RBC: 3.79 MIL/uL — ABNORMAL LOW (ref 4.22–5.81)
RDW: 15.2 % (ref 11.5–15.5)
WBC: 7.3 10*3/uL (ref 4.0–10.5)

## 2016-06-11 LAB — COMPREHENSIVE METABOLIC PANEL
ALT: 65 U/L — ABNORMAL HIGH (ref 17–63)
AST: 87 U/L — ABNORMAL HIGH (ref 15–41)
Albumin: 2.7 g/dL — ABNORMAL LOW (ref 3.5–5.0)
Alkaline Phosphatase: 74 U/L (ref 38–126)
Anion gap: 10 (ref 5–15)
BUN: 14 mg/dL (ref 6–20)
CO2: 23 mmol/L (ref 22–32)
Calcium: 8.8 mg/dL — ABNORMAL LOW (ref 8.9–10.3)
Chloride: 100 mmol/L — ABNORMAL LOW (ref 101–111)
Creatinine, Ser: 0.96 mg/dL (ref 0.61–1.24)
GFR calc Af Amer: >60 mL/min (ref >60)
GFR calc non Af Amer: >60 mL/min (ref >60)
Glucose, Bld: 99 mg/dL (ref 65–99)
Potassium: 4.1 mmol/L (ref 3.5–5.1)
Sodium: 133 mmol/L — ABNORMAL LOW (ref 135–145)
Total Bilirubin: 0.6 mg/dL (ref 0.3–1.2)
Total Protein: 5.6 g/dL — ABNORMAL LOW (ref 6.5–8.1)

## 2016-06-11 LAB — BASIC METABOLIC PANEL
Anion gap: 13 (ref 5–15)
BUN: 17 mg/dL (ref 6–20)
CO2: 18 mmol/L — ABNORMAL LOW (ref 22–32)
Calcium: 8.3 mg/dL — ABNORMAL LOW (ref 8.9–10.3)
Chloride: 96 mmol/L — ABNORMAL LOW (ref 101–111)
Creatinine, Ser: 0.95 mg/dL (ref 0.61–1.24)
GFR calc Af Amer: >60 mL/min (ref >60)
GFR calc non Af Amer: >60 mL/min (ref >60)
Glucose, Bld: 111 mg/dL — ABNORMAL HIGH (ref 65–99)
Potassium: 4.3 mmol/L (ref 3.5–5.1)
Sodium: 127 mmol/L — ABNORMAL LOW (ref 135–145)

## 2016-06-11 LAB — GLUCOSE, CAPILLARY: Glucose-Capillary: 98 mg/dL (ref 65–99)

## 2016-06-11 MED ORDER — TAMSULOSIN HCL 0.4 MG PO CAPS: 0.4000 mg | ORAL_CAPSULE | ORAL | Status: DC

## 2016-06-11 MED ORDER — SODIUM CHLORIDE 0.9 % IV BOLUS (SEPSIS)
500.0000 mL | Freq: Once | INTRAVENOUS | Status: AC
  Administered 2016-06-11: 500 mL via INTRAVENOUS

## 2016-06-11 NOTE — Progress Notes (Signed)
Herbert Lorie Phenix, MD Physician Signed Physical Medicine and Rehabilitation  Consult Note Date of Service: 06/06/2016 3:31 PM  Related encounter: ED to Hosp-Admission (Discharged) from 06/04/2016 in [place] All Collapse All   [] Hide copied text      Physical Medicine and Rehabilitation Consult   Reason for Consult: Debility Referring Physician: Rosita Fire, MD   HPI: Herbert Marquez is a 83 y.o. 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 History taken from chart review and wife. He was making good progress at home and had completed HHPT. He was admitted on 06/04/16 via urology office with facial changes followed by mental status changes with agitation. MRI brain reviewed, but limited due to motion per report, secondary to severe agitation and  negative for large acute infarct. EEG done revealing moderate diffuse slowing. UA negative. Blood cultures with Staph epidermidis thought to be contaminant, but started on IV Vanc due to fevers and encephalopathy.     Review of Systems  Unable to perform ROS: Other  Somnolent.       Past Medical History:  Diagnosis Date  . Arthritis    "pretty much all over"   . Basal cell carcinoma    "several burned off his body, face, head"  . BPH (benign prostatic hypertrophy)   . Coronary artery disease    a. s/p PCI of RCA in 2006  . CVA (cerebral infarction)    a. 06/2015: left thalamic and bilateral PCA  . GERD (gastroesophageal reflux disease)   . Hyperlipidemia   . Hypertension   . TIA (transient ischemic attack)    Approximately 6 weeks post-cardiac catheterization.   . Type II diabetes mellitus ([place])    "prediabetic; lost alot of weight; not diabetic now" (06/16/2015)        Past Surgical History:    Procedure Laterality Date  . CARDIOVASCULAR STRESS TEST  07/01/2007   EF 74%  . CATARACT EXTRACTION, BILATERAL    . CORONARY ANGIOPLASTY WITH STENT PLACEMENT  10/2004   stenting x 2 to RCA  . FEMUR IM NAIL Right 11/26/2015   Procedure: INTRAMEDULLARY RIGHT (IM) NAIL FEMORAL;  Surgeon: Rod Can, MD;  Location: [facility] ORS;  Service: Orthopedics;  Laterality: Right;  . HERNIA REPAIR    . HIP ARTHROPLASTY  03/09/2011   Procedure: ARTHROPLASTY BIPOLAR HIP;  Surgeon: Mauri Pole;  Location: [facility] ORS;  Service: Orthopedics;  Laterality: Left;  . LAPAROSCOPIC INCISIONAL / UMBILICAL / VENTRAL HERNIA REPAIR     "below his naval"        Family History  Problem Relation Age of Onset  . Hypertension Mother   . Lung cancer Father   . Lung cancer Brother    Social History:  reports that he quit smoking about 45 years ago. He has never used smokeless tobacco. He reports that he drinks about 4.8 oz of alcohol per week . He reports that he does not use drugs. Allergies: No Known Allergies       Medications Prior to Admission  Medication Sig Dispense Refill  . acetaminophen (TYLENOL) 500 MG tablet Take 1 tablet (500 mg total) by mouth every 6 (six) hours as needed for mild pain. (Patient taking differently: Take 1,000 mg by mouth every 6 (six) hours as needed for  mild pain. ) 30 tablet 0  . apixaban (ELIQUIS) 5 MG TABS tablet Take 1 tablet (5 mg total) by mouth 2 (two) times daily. 60 tablet 1  . atorvastatin (LIPITOR) 80 MG tablet Take 1 tablet (80 mg total) by mouth daily. (Patient taking differently: Take 80 mg by mouth at bedtime. ) 90 tablet 0  . Menthol, Topical Analgesic, (BLUE-EMU MAXIMUM STRENGTH EX) Apply 1 application topically at bedtime. Knee pain    . Multiple Vitamin (MULTIVITAMIN WITH MINERALS) TABS tablet Take 1 tablet by mouth daily.    [name] omeprazole (PRILOSEC) 20 MG capsule Take 20 mg by mouth daily.    . polyvinyl alcohol (ARTIFICIAL TEARS) 1.4 % ophthalmic  solution Place 1 drop into both eyes daily as needed for dry eyes.    . tamsulosin (FLOMAX) 0.4 MG CAPS capsule Take 0.4 mg by mouth every other day.     . citalopram (CELEXA) 10 MG tablet Take 10 mg by mouth daily.    . nitroGLYCERIN (NITROSTAT) 0.4 MG SL tablet Place 0.4 mg under the tongue every 5 (five) minutes as needed for chest pain.      Home: Home Living Family/patient expects to be discharged to:: Private residence Living Arrangements: Spouse/significant other Additional Comments: Spent a long time talking with pt and family about plans.  pt was home with [facility] services - he was walking with RW and wife following with wheelchair. He was able to transfer with wifes SBA only.  Functional History: Prior Function Comments: pt reports he was up to walking 100 feet with RW and chair following - even on sunday (4 days ago).  90 year old wife offers all assistance.  Family has talked about hiring additional help but they havent up to this time Functional Status:  Mobility: Bed Mobility Overal bed mobility: Needs Assistance Bed Mobility: Supine to Sit Supine to sit: Min assist General bed mobility comments: pt has rail on his bed at home.  pt had hard time getting up to sitting as not wanting to use right arm for pushing due to pain Transfers Overall transfer level: Needs assistance Equipment used: Rolling walker (2 wheeled) Transfers: Sit to/from Stand, Stand Pivot Transfers Sit to Stand: Min assist, From elevated surface Stand pivot transfers: Min assist General transfer comment: pt wanting to stand on his own but unabel to as not wanting to put wieght through his right hand.  I did raise the bed to make standing easier.  pt had hard time managing RW (needed assist to do) when turning to sit in chair. pt reminded to reach bck with left hand but still sat with decreaed control. Ambulation/Gait General Gait Details: pt unable as cant put weight through RW for  support.  ADL:  Cognition: Cognition Overall Cognitive Status: Within Functional Limits for tasks assessed Orientation Level: Disoriented to place, Disoriented to situation, Oriented to person Cognition Arousal/Alertness: Awake/alert Behavior During Therapy: Agitated (pt irritated about me having him get OOB when he is hurting) Overall Cognitive Status: Within Functional Limits for tasks assessed  Blood pressure (!) 122/55, pulse 86, temperature 97.8 F (36.6 C), temperature source Oral, resp. rate 17, height 5\' 7"  (1.702 m), weight 74.1 kg (163 lb 4.8 oz), SpO2 97 %. Physical Exam  Vitals reviewed. Constitutional: He appears well-developed.  Frail  HENT:  Head: Normocephalic and atraumatic.  Poor dentition  Eyes: Conjunctivae and EOM are normal.  Neck: Normal range of motion. Neck supple.  Musculoskeletal: He exhibits edema (Right wrist) and tenderness (Right wrist  and knee).  Neurological:  Neuro: Slightly confused and somnolent, briefly opens eyes and falls back to sleep HOH Motor: RUE/RLE: 0/5 (?participation and pain) LUE/LLE: 4/5  Skin: Skin is warm and dry. There is erythema (right wrist).  Psychiatric: His speech is delayed. He is slowed.    Lab Results Last 24 Hours        Results for orders placed or performed during the hospital encounter of 06/04/16 (from the past 24 hour(s))  Culture, blood (routine x 2)     Status: None (Preliminary result)   Collection Time: 06/05/16  7:04 PM  Result Value Ref Range   Specimen Description BLOOD LEFT ANTECUBITAL    Special Requests      BOTTLES DRAWN AEROBIC AND ANAEROBIC Blood Culture results may not be optimal due to an excessive volume of blood received in culture bottles   Culture NO GROWTH < 24 HOURS    Report Status PENDING   Culture, blood (routine x 2)     Status: None (Preliminary result)   Collection Time: 06/05/16  7:06 PM  Result Value Ref Range   Specimen Description BLOOD LEFT HAND     Special Requests      BOTTLES DRAWN AEROBIC ONLY Blood Culture results may not be optimal due to an excessive volume of blood received in culture bottles   Culture NO GROWTH < 24 HOURS    Report Status PENDING   Glucose, capillary     Status: Abnormal   Collection Time: 06/06/16  8:00 AM  Result Value Ref Range   Glucose-Capillary 131 (H) 65 - 99 mg/dL   Comment 1 Notify RN       Imaging Results (Last 48 hours)  Dg Chest 2 View  Result Date: 06/04/2016 CLINICAL DATA:  Evaluate for pneumonia EXAM: CHEST  2 VIEW COMPARISON:  March 09, 2011 FINDINGS: The heart size is enlarged. The mid aorta is tortuous. There is increased pulmonary interstitium bilaterally. There is minimal right pleural effusion. There is no focal pneumonia. The osseous structures are stable. IMPRESSION: Mild interstitial edema.  Small right pleural effusion. Electronically Signed   By: Abelardo Diesel M.D.   On: 06/04/2016 19:15   Dg Knee 1-2 Views Right  Result Date: 06/05/2016 CLINICAL DATA:  Right knee pain. EXAM: RIGHT KNEE - 1-2 VIEW COMPARISON:  11/25/2015. FINDINGS: Marked lateral joint space narrowing with progression and moderate medial joint space narrowing without significant change. Mild to moderate lateral spur formation and moderate spur formation. Small loose body in the suprapatellar bursa. No effusion seen. Interval intramedullary rod in the femur. Atheromatous arterial calcifications. IMPRESSION: Severe lateral compartment degenerative changes with progression and moderate medial compartment degenerative changes without significant change. Electronically Signed   By: Claudie Revering M.D.   On: 06/05/2016 11:49   Ct Head Wo Contrast  Result Date: 06/04/2016 CLINICAL DATA:  Altered mental status EXAM: CT HEAD WITHOUT CONTRAST TECHNIQUE: Contiguous axial images were obtained from the base of the skull through the vertex without intravenous contrast. COMPARISON:  04/12/2016, 03/31/2016, 03/30/2016  FINDINGS: Brain: Examination is significantly degraded by gross patient motion, as well as positioning. No gross hemorrhage or mass is visualized. Periventricular white matter small vessel ischemic changes. Ventricle size grossly stable. Vascular: Carotid artery calcifications Skull: Limited due to motion Sinuses/Orbits: No acute abnormality Other: None IMPRESSION: Significantly limited exam secondary to patient motion and positioning. No gross acute abnormality is visualized Electronically Signed   By: Donavan Foil M.D.   On: 06/04/2016 17:23  Mr Brain Wo Contrast  Result Date: 06/04/2016 CLINICAL DATA:  Acute encephalopathy EXAM: MRI HEAD WITHOUT CONTRAST TECHNIQUE: Multiplanar, multiecho pulse sequences of the brain and surrounding structures were obtained without intravenous contrast. COMPARISON:  Head CT 06/04/2016 FINDINGS: The examination had to be discontinued prior to completion due to patient agitation, fighting to get up scanner. Only axial diffusion weighted imaging was obtained and that is severely motion degraded. An axial ADC map was constructed from this data. The diffusion-weighted imaging shows no large area of diffusion restriction. There is no midline shift. IMPRESSION: Severely limited examination without evidence of large acute infarct. Electronically Signed   By: Ulyses Jarred M.D.   On: 06/04/2016 21:43   Dg Hip Unilat With Pelvis 2-3 Views Right  Result Date: 06/05/2016 CLINICAL DATA:  Right hip pain.  No known injury. EXAM: DG HIP (WITH OR WITHOUT PELVIS) 2-3V RIGHT COMPARISON:  11/24/2015 and 11/26/2015. FINDINGS: Hardware fixation of the previously demonstrated comminuted right intertrochanteric fracture with interval bridging callus. No acute fracture or dislocation seen. Marked diffuse osteopenia. Stable left hip prosthesis. Atheromatous arterial calcifications, including the abdominal aorta. IMPRESSION: 1. No acute fracture. 2. Severe osteopenia. 3. Aortic atherosclerosis.  Electronically Signed   By: Claudie Revering M.D.   On: 06/05/2016 11:51   Dg Femur, Min 2 Views Right  Result Date: 06/05/2016 CLINICAL DATA:  Right leg pain.  No known injury. EXAM: RIGHT FEMUR 2 VIEWS COMPARISON:  11/26/2015. FINDINGS: Stable compression screw and rod fixation of the previously demonstrated comminuted right intertrochanteric fracture. Interval bridging callus. No acute fracture or dislocation. Atheromatous arterial calcifications. IMPRESSION: No acute fracture or dislocation. Electronically Signed   By: Claudie Revering M.D.   On: 06/05/2016 11:53     Assessment/Plan: Diagnosis: Debility Labs and images independently reviewed.  Records reviewed and summated above.  1. Does the need for close, 24 hr/day medical supervision in concert with the patient's rehab needs make it unreasonable for this patient to be served in a less intensive setting? Potentially 2. 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 3. Due to safety, skin/wound care, disease management, medication administration, pain management and patient education, does the patient require 24 hr/day rehab nursing? Yes 4. Does the patient require coordinated care of a physician, rehab nurse, PT (1-2 hrs/day, 5 days/week), OT (1-2 hrs/day, 5 days/week) and SLP (1-2 hrs/day, 5 days/week) to address physical and functional deficits in the context of the above medical diagnosis(es)? Potentially Addressing deficits in the  following areas: balance, endurance, locomotion, strength, transferring, dressing, feeding, toileting, cognition and psychosocial support 5. Can the patient actively participate in an intensive therapy program of at least 3 hrs of therapy per day at least 5 days per week? Potentially 6. The potential for patient to make measurable gains while on inpatient rehab is good 7. Anticipated functional outcomes upon discharge from inpatient rehab are supervision and min assist  with PT, supervision and min assist with OT, supervision and min assist with SLP. 8. Estimated rehab length of stay to reach the above functional goals is: 15-18 days.  9. Does the patient have  adequate social supports and living environment to accommodate these discharge functional goals? Potentially 10. Anticipated D/C setting: Home 11. Anticipated post D/C treatments: HH therapy and Home excercise program 12. Overall Rehab/Functional Prognosis: good and fair  RECOMMENDATIONS: This patient's condition is appropriate for continued rehabilitative care in the following setting: Would consider repeat MRI with sedative if patient truely is flaccid on right side. If weakness seconady to pareticipation, pt will likely require longer length of stay for recovery and therefore, recoomend SNF. Patient has agreed to participate in recommended program. Potentially Note that insurance prior authorization may be required for reimbursement for recommended care.  Comment: Delice Lesch, MD, Mellody Drown  Delice Lesch, MD, [place], [place] 06/06/2016    Revision History                        Routing History

## 2016-06-11 NOTE — Progress Notes (Signed)
Rehab admissions - Patient was admitted on 06/10/16 rather than on 06/09/16 due to tachycardia episode.  He was seen by cardiology and his beta blocker was increased.  He was then cleared by cardiology and attending MD for admission to inpatient rehab on 06/10/16.  #[phone]

## 2016-06-11 NOTE — Evaluation (Signed)
Physical Therapy Assessment and Plan  Patient Details  Name: Herbert Marquez MRN: [id number] Date of Birth: 10-09-1927  PT Diagnosis: Abnormal posture, Cognitive deficits, Difficulty walking, Dizziness and giddiness, Muscle weakness, Osteoarthritis and Pain in joint Rehab Potential: Good ELOS: 12-16 days   Today's Date: 06/11/2016 PT Individual Time: 0900-1000 PT Individual Time Calculation (min): 60 min    Problem List:  Patient Active Problem List   Diagnosis Date Noted  . Embolic stroke ([place]) 49/70/2637  . Right hemiparesis ([place])   . History of CVA with residual deficit   . Chronic anticoagulation   . Atrial fibrillation with rapid ventricular response ([place])   . Seizure prophylaxis   . Diastolic dysfunction   . Bacteremia due to Gram-positive bacteria   . Altered mental status   . Dementia without behavioral disturbance   . Leukocytosis   . Acute blood loss anemia   . Acute encephalopathy 06/04/2016  . PAF (paroxysmal atrial fibrillation) ([place])   . Iron deficiency anemia 04/04/2016  . History of CVA (cerebrovascular accident) 04/04/2016  . Cerebral hemorrhage (H[facility]) w/ SDH s/p IV tPA   . Coronary artery disease involving native coronary artery of native heart without angina pectoris   . History of right hip replacement   . Acute ischemic stroke ([place]) - L temporal lobe s/p tPA 03/30/2016  . BPH (benign prostatic hyperplasia) 11/25/2015  . GERD (gastroesophageal reflux disease) 11/25/2015  . CKD (chronic kidney disease), stage II 11/25/2015  . Overactive bladder   . Cerebrovascular accident (CVA) ([place]) 09/18/2015  . Gait disturbance, post-stroke 06/23/2015  . 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 Thalamic infarction ([place]) 06/21/2015  . Benign essential HTN   . Type 2 diabetes mellitus with complication, without long-term current use of insulin ([place])   . Dysphagia as late effect of cerebrovascular disease   . Hyponatremia   . HLD (hyperlipidemia)   . Obesity  03/09/2011  . COPD (chronic obstructive pulmonary disease) ([place]) 03/09/2011    Past Medical History:  Past Medical History:  Diagnosis Date  . Arthritis    "pretty much all over"   . Basal cell carcinoma    "several burned off his body, face, head"  . BPH (benign prostatic hypertrophy)   . Coronary artery disease    a. s/p PCI of RCA in 2006  . CVA (cerebral infarction)    a. 06/2015: left thalamic and bilateral PCA  . GERD (gastroesophageal reflux disease)   . Hyperlipidemia   . Hypertension   . TIA (transient ischemic attack)    Approximately 6 weeks post-cardiac catheterization.   . Type II diabetes mellitus ([place])    "prediabetic; lost alot of weight; not diabetic now" (06/16/2015)   Past Surgical History:  Past Surgical History:  Procedure Laterality Date  . CARDIOVASCULAR STRESS TEST  07/01/2007   EF 74%  . CATARACT EXTRACTION, BILATERAL    . CORONARY ANGIOPLASTY WITH STENT PLACEMENT  10/2004   stenting x 2 to RCA  . FEMUR IM NAIL Right 11/26/2015   Procedure: INTRAMEDULLARY RIGHT (IM) NAIL FEMORAL;  Surgeon: Rod Can, MD;  Location: [facility];  Service: Orthopedics;  Laterality: Right;  . HERNIA REPAIR    . HIP ARTHROPLASTY  03/09/2011   Procedure: ARTHROPLASTY BIPOLAR HIP;  Surgeon: Mauri Pole;  Location: [facility];  Service: Orthopedics;  Laterality: Left;  . LAPAROSCOPIC INCISIONAL / UMBILICAL / VENTRAL HERNIA REPAIR     "below his naval"    Assessment & Plan Clinical Impression: Patient is  a 83 y.o. year old male with history of PAF, dementia, gait disorder due to OA right knee, CAD, CVA 06/2009, recent left thalamic stroke with SDH --CIR stay and discharged to home at supervision level due to ataxia. History taken from chart review and wife. He was making good progress at home and had completed HHPT. He was admitted on 06/04/16 via urology office with facial changes followed by mental status changes with agitation. MRI brain reviewed, but limited due to motion per  report, secondary to severe agitation and negative for large acute infarct. EEG done revealing moderate diffuse slowing. UA negative. Blood cultures with Staph epidermidis thought to be contaminant, but started on IV Vanc due to fevers and encephalopathy.  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   Neurology recommended continuing Eliquis for secondary stroke prevention and stroke embolic due ot CVA or carotid stenosis Dr. Shon Hale felt that encephalopathy due to seizure as patient at high risk from his recent temporal lobe infarct and recommended Keppra for treatment. Antibiotics discontinued as staph epidermis + BC felt to be due to contaminant. Pt noted to be tachycardic and evaluated by Cardiology, who increased beta blocker and cleared for admission to inpatient rehab 4/7. EEG performed showing Grade 1 diastolic dysfunction. Mentation improving and patient showing improvement in activity tolerance. CIR recommended for follow up therapy.  Patient transferred to CIR on 06/10/2016 .   Patient currently requires mod with mobility secondary to muscle weakness and muscle joint tightness, decreased cardiorespiratoy endurance, decreased memory and decreased sitting balance, decreased standing balance, decreased postural control and decreased balance strategies.  Prior to hospitalization, patient was supervision with mobility w/c level and lived with Spouse in a House home.  Home access is  Ramped entrance.  Patient will benefit from skilled PT intervention to maximize safe functional mobility, minimize fall risk and decrease caregiver burden for planned discharge home with 24 hour supervision.  Anticipate patient will benefit from follow up [place] at discharge.  PT - End of Session Activity Tolerance: Decreased this session (orthostasis limiting standing activities) Endurance Deficit: Yes PT Assessment Rehab Potential  (ACUTE/IP ONLY): Good Barriers to Discharge:  (wife can provide supervision; limited physical assist) Barriers to Discharge Comments: wife can provide supervision; limited physical assist PT Patient demonstrates impairments in the following area(s): Balance;Endurance;Motor;Pain;Skin Integrity PT Transfers Functional Problem(s): Car;Bed Mobility;Bed to Chair;Furniture PT Locomotion Functional Problem(s): Stairs;Wheelchair Mobility;Ambulation PT Plan PT Intensity: Minimum of 1-2 x/day ,45 to 90 minutes PT Frequency: 5 out of 7 days PT Duration Estimated Length of Stay: 12-16 days PT Treatment/Interventions: Training and development officer;Ambulation/gait training;Cognitive remediation/compensation;Community reintegration;Discharge planning;Disease management/prevention;DME/adaptive equipment instruction;Functional mobility training;Neuromuscular re-education;Pain management;Patient/family education;Psychosocial support;Skin care/wound management;Splinting/orthotics;Stair training;Therapeutic Activities;Therapeutic Exercise;UE/LE Strength taining/ROM;UE/LE Coordination activities;Wheelchair propulsion/positioning PT Transfers Anticipated Outcome(s): supervision PT Locomotion Anticipated Outcome(s): mod I w/c mobility; min assist short distance gait PT Recommendation Follow Up Recommendations: Home health PT;24 hour supervision/assistance Patient destination: Home Equipment Recommended: To be determined Equipment Details: may require specialty w/c cushion due to Stage II sacral ulcer. Did not clarify with wife what kind of cushion he has at home in prior w/c. Pt's wife expressed wanting to see if getting a narrower w/c was possible. Will need to discuss with CSW/DME provider  Skilled Therapeutic Intervention Evaluation completed (see details above and below) with education on PT POC and goals and individual treatment initiated with focus on functional bed mobility, squat pivot transfers, sit <> stands,  w/c propulsion, and standing  tolerance. Pt able to sit EOB for dressing with some assist by PT for time management. Required min to mod assist for standing balance without use of RW to pull up pants. Total assist for socks, shoes, and brace for time management. J2 cushion provided due to stage II sacral ulcer for pressure relief and positioning. Pt propelled w/c on unit with supervision and extra time. During transfers and sit <> stands, BP assessed due to pt reporting feeling lightheaded - see vitals below. Discussed with RN and PA who recommended Thigh high tedhose and plan to order abdominal binder. Pt and wife educated on increasing upright tolerance OOB in chair as well to improve orthostasis - wife reports this is the first time patient has really been OOB. Pt able to perform transfer and sit <> stands with min to mod assist - posterior lean noted with 1 LOB posterior in standing. Unable to assess gait at this time due to low BP.    PT Evaluation Precautions/Restrictions Precautions Precautions: Fall;Other (comment) Precaution Comments: seizure; orthostatic - watch BPs Required Braces or Orthoses: Other Brace/Splint Other Brace/Splint: R knee brace Restrictions Weight Bearing Restrictions: No  Vital Signs Seated: 96/61 mmHg; HR = 70 bpm Standing: 80/49 mmHg; HR = 91 bpm Therapy Vitals BP: (!) 108/59 Patient Position (if appropriate): Sitting (donned Tedhose and seated x 5 min)  Pain  c/o headache - RN made aware. Home Living/Prior Functioning Home Living Living Arrangements: Spouse/significant other Available Help at Discharge: Family;Available 24 hours/day Type of Home: House Home Access: Ramped entrance Home Layout: One level  Lives With: Spouse Prior Function Level of Independence: Requires assistive device for independence;Needs assistance with gait;Independent with transfers Comments: Pt was primarily w/c level PTA. He was independent with basic transfers with wife providing  overall supervision and set-up. She reports he could dress and bathe himself but sometimes she would assist for time management. Pt using w/c in the home for mobility but was walking with HHPT and wife providing assistance with RW.    Cognition Memory: Impaired Safety/Judgment: Appears intact Sensation Sensation Light Touch: Appears Intact Additional Comments: denies presidual deficits from prior CVA Motor  Motor Motor: Abnormal postural alignment and control Motor - Skilled Clinical Observations: posterior lean in standing; difficulty with full assessment in standing due to orthostasis     Trunk/Postural Assessment  Cervical Assessment Cervical Assessment:  (forward head and rounded shoulders) Thoracic Assessment Thoracic Assessment:  (flexed posture) Lumbar Assessment Lumbar Assessment:  (posterior pelvic tilt) Postural Control Postural Control: Deficits on evaluation (posterior lean in standing; decreased balance reactions)  Balance Balance Balance Assessed: Yes Static Sitting Balance Static Sitting - Level of Assistance: 5: Stand by assistance Dynamic Sitting Balance Dynamic Sitting - Level of Assistance: 5: Stand by assistance;4: Min assist Static Standing Balance Static Standing - Level of Assistance: 4: Min assist Dynamic Standing Balance Dynamic Standing - Level of Assistance: 4: Min assist;3: Mod assist (very limited assessment due to orthostasis) Extremity Assessment      RLE Assessment RLE Assessment: Exceptions to [facility] RLE Strength RLE Overall Strength Comments: wears knee brace (sleeve and knee cage) due to h/o R knee OA and instability. Moves against gravity without difficulty LLE Assessment LLE Assessment: Exceptions to [facility] (did not formally assess; grossly appears 3+ to 4-/5)   See Function Navigator for Current Functional Status.   Refer to Care Plan for Long Term Goals  Recommendations for other services: None   Discharge Criteria: Patient will be  discharged from PT if patient refuses  treatment 3 consecutive times without medical reason, if treatment goals not met, if there is a change in medical status, if patient makes no progress towards goals or if patient is discharged from hospital.  The above assessment, treatment plan, treatment alternatives and goals were discussed and mutually agreed upon: by patient and by family  Juanna Cao, PT, DPT  06/11/2016, 11:53 AM

## 2016-06-11 NOTE — Progress Notes (Signed)
Ankit Lorie Phenix, MD Physician Signed Physical Medicine and Rehabilitation  PMR Pre-admission Date of Service: 06/08/2016 4:15 PM  Related encounter: ED to Hosp-Admission (Discharged) from 06/04/2016 in [facility]       [] Hide copied text PMR Admission Coordinator Pre-Admission Assessment  Patient: Herbert Marquez is an 83 y.o., male MRN: [id number] DOB: [birth year] Height: 5\' 7"  (170.2 cm) Weight: 73.1 kg (161 lb 3.2 oz)                                                                                                                                                  Insurance Information HMO: No   PPO:       PCP:       IPA:       80/20:       OTHER:   PRIMARY:  Medicare A/B      Policy#:  [id number]      Subscriber:  Barbarann Ehlers CM Name:        Phone#:       Fax#:   Pre-Cert#:        Employer:   Benefits:  Phone #:       Name:  Checked in [place]. Date: 08/04/94     Deduct:  $1340      Out of Pocket Max:  none      Life Max: unlimited CIR: 100%      SNF: 100 days Outpatient: 80%     Co-Pay: 20% Home Health: 100%      Co-Pay: none DME: 80%     Co-Pay: 20% Providers: patient's choice  SECONDARY:  UHC      Policy#:  [id number]      Subscriber:  Barbarann Ehlers CM Name:        Phone#:       Fax#:   Pre-Cert#:        Employer:  Retired Benefits:  Phone #: [phone]     Name:   Eff. Date:       Deduct:        Out of Pocket Max:        Life Max:   CIR:        SNF:   Outpatient:       Co-Pay:   Home Health:        Co-Pay:   DME:       Co-Pay:    Emergency Contact Information        Contact Information    Name Relation Home Work Mobile   [place] Spouse [phone]  [phone]   [place], [place] Daughter [phone]       Current Medical History  Patient Admitting Diagnosis:  New R CVA  History of Present Illness: An 83 y.o.malewith history of PAF, dementia, gait disorder due to OA right knee, CAD, CVA  06/2009, recent left  thalamic stroke with SDH --CIR stay and discharged to home at supervision level due to ataxia. History taken from chart review and wife. He was making good progress at home and had completed HHPT. He was admitted on 06/04/16 via urology office with facial changes followed by mental status changes with agitation. MRI brain reviewed, but limited due to motion per report, secondary to severe agitation and negative for large acute infarct. EEG done revealing moderate diffuse slowing. UA negative. Blood cultures with Staph epidermidis thought to be contaminant, but started on IV Vanc due to fevers and encephalopathy. 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  Pt noted to be tachycardic and evaluated by Cardiology, who increased beta blocker and cleared for admission to inpatient rehab. PT/OT/SLP evaluations completed and therapies have been ongoing.  PT now recommending inpatient rehab.  Past Medical History      Past Medical History:  Diagnosis Date  . Arthritis    "pretty much all over"   . Basal cell carcinoma    "several burned off his body, face, head"  . BPH (benign prostatic hypertrophy)   . Coronary artery disease    a. s/p PCI of RCA in 2006  . CVA (cerebral infarction)    a. 06/2015: left thalamic and bilateral PCA  . GERD (gastroesophageal reflux disease)   . Hyperlipidemia   . Hypertension   . TIA (transient ischemic attack)    Approximately 6 weeks post-cardiac catheterization.   . Type II diabetes mellitus ([place])    "prediabetic; lost alot of weight; not diabetic now" (06/16/2015)    Family History  family history includes Hypertension in his mother; Lung cancer in his brother and father.  Prior Rehab/Hospitalizations: Was on CIR 05/17.  Was at [facility] in the past year.  Recently on CIR as well.  Has the patient had major surgery during 100 days prior to admission? No  Current Medications    Current Facility-Administered Medications:  .  0.9 %  sodium chloride infusion, 250 mL, Intravenous, PRN, Ilene Qua Opyd, MD .  acetaminophen (TYLENOL) tablet 650 mg, 650 mg, Oral, Q6H PRN, 650 mg at 06/05/16 0837 **OR** acetaminophen (TYLENOL) suppository 650 mg, 650 mg, Rectal, Q6H PRN, Vianne Bulls, MD, 650 mg at 06/04/16 2207 .  apixaban (ELIQUIS) tablet 5 mg, 5 mg, Oral, BID, Ilene Qua Opyd, MD, 5 mg at 06/08/16 1002 .  atorvastatin (LIPITOR) tablet 80 mg, 80 mg, Oral, QHS, Ilene Qua Opyd, MD, 80 mg at 06/07/16 2318 .  diclofenac sodium (VOLTAREN) 1 % transdermal gel 2 g, 2 g, Topical, QID, Dron Tanna Furry, MD, 2 g at 06/08/16 1001 .  LORazepam (ATIVAN) tablet 0.5 mg, 0.5 mg, Oral, Once PRN, Dron Tanna Furry, MD .  multivitamin with minerals tablet 1 tablet, 1 tablet, Oral, Daily, Vianne Bulls, MD, 1 tablet at 06/08/16 1002 .  ondansetron (ZOFRAN) tablet 4 mg, 4 mg, Oral, Q6H PRN **OR** ondansetron (ZOFRAN) injection 4 mg, 4 mg, Intravenous, Q6H PRN, Ilene Qua Opyd, MD .  oxyCODONE (Oxy IR/ROXICODONE) immediate release tablet 5 mg, 5 mg, Oral, Q6H PRN, Dron Tanna Furry, MD, 5 mg at 06/05/16 1756 .  pantoprazole (PROTONIX) EC tablet 40 mg, 40 mg, Oral, Daily, Ilene Qua Opyd, MD, 40 mg at 06/08/16 1002 .  polyvinyl alcohol (LIQUIFILM TEARS) 1.4 % ophthalmic solution 1 drop, 1 drop, Both Eyes, Daily PRN, Ilene Qua Opyd, MD .  potassium chloride 20 MEQ/15ML (10%)  solution 20 mEq, 20 mEq, Oral, Daily, Dron Tanna Furry, MD, 20 mEq at 06/08/16 1002 .  sodium chloride flush (NS) 0.9 % injection 3 mL, 3 mL, Intravenous, Q12H, Ilene Qua Opyd, MD, 3 mL at 06/08/16 1000 .  sodium chloride flush (NS) 0.9 % injection 3 mL, 3 mL, Intravenous, Q12H, Ilene Qua Opyd, MD, 3 mL at 06/08/16 1000 .  sodium chloride flush (NS) 0.9 % injection 3 mL, 3 mL, Intravenous, PRN, Ilene Qua Opyd, MD .  tamsulosin (FLOMAX) capsule 0.4 mg, 0.4 mg, Oral, QODAY, Ilene Qua Opyd, MD, 0.4 mg at 06/07/16  4315  Patients Current Diet: DIET - DYS 1 Room service appropriate? Yes; Fluid consistency: Thin  Precautions / Restrictions Precautions Precautions: Fall Precaution Comments: family denies any falls in last 3 months Restrictions Weight Bearing Restrictions: No Other Position/Activity Restrictions: Pain in R knee (chronic injury), and R hip (rod placed in ~Oct 2017 per spouse).   Has the patient had 2 or more falls or a fall with injury in the past year?Yes.  [place] [address] out of chair and had to have femur surgery in Oct/Nov 2017  Prior Activity Level Limited Community (1-2x/wk): Went out 2-3 X a week, was not driving.  Home Assistive Devices / Equipment Home Assistive Devices/Equipment: Environmental consultant (specify type), Wheelchair, Hearing aid, Grab bars in shower Home Equipment: Environmental consultant - 2 wheels, [employer] - single point, Wheelchair - manual, Systems analyst, Civil engineer, contracting, Grab bars - tub/shower  Prior Device Use: Indicate devices/aids used by the patient prior to current illness, exacerbation or injury? Manual wheelchair, Walker and seated 4 W walker  Prior Functional Level Prior Function Level of Independence: Independent with assistive device(s) Comments: mainly at w/c level at home but able to transfer to toilet and shower on his own. bathes and dresses with increased time; wife assists if they are in a hurry.  Self Care: Did the patient need help bathing, dressing, using the toilet or eating?  Needed some help  Indoor Mobility: Did the patient need assistance with walking from room to room (with or without device)? Needed some help  Stairs: Did the patient need assistance with internal or external stairs (with or without device)? Dependent  Functional Cognition: Did the patient need help planning regular tasks such as shopping or remembering to take medications? Needed some help  Current Functional Level Cognition  Overall Cognitive Status: Within Functional Limits for tasks  assessed Orientation Level: Oriented to person, Oriented to place, Oriented to time, Oriented to situation General Comments: pt alert, following commands and disoriented to place and month    Extremity Assessment (includes Sensation/Coordination)  Upper Extremity Assessment: RUE deficits/detail RUE Deficits / Details: increased edema noted, able to wiggle fingers RUE: Unable to fully assess due to pain  Lower Extremity Assessment: Defer to PT evaluation    ADLs  Overall ADL's : Needs assistance/impaired Eating/Feeding: Minimal assistance, Sitting Grooming: Moderate assistance, Sitting Upper Body Bathing: Moderate assistance, Sitting Lower Body Bathing: Maximal assistance Upper Body Dressing : Minimal assistance, Sitting Lower Body Dressing: Maximal assistance Toilet Transfer: Moderate assistance, Stand-pivot, RW General ADL Comments: Pt declining transfers at this time; agreeable to sit EOB but reports he is in a lot of pain and tired.    Mobility  Overal bed mobility: Needs Assistance Bed Mobility: Sit to Sidelying Rolling: Supervision Supine to sit: Min assist Sit to supine: Max assist Sit to sidelying: Mod assist General bed mobility comments: VCs for sequencing of where to put and keep hands has he came  down on his right side and then rolled over onto his back    Transfers  Overall transfer level: Needs assistance Equipment used: Rolling walker (2 wheeled) Transfers: Sit to/from Stand Sit to Stand: Max assist (from recliner) Stand pivot transfers:  (started out Mod A as he turned, then max A as he stepped backwards) General transfer comment: VCs for safe hand placement, increased time once in standing to get full upright posture (pt flexed and leaning to left)    Ambulation / Gait / Stairs / Wheelchair Mobility  Ambulation/Gait Ambulation/Gait assistance: Mod assist, +2 safety/equipment Ambulation Distance (Feet): 26 Feet Assistive device: Rolling walker (2  wheeled) Gait Pattern/deviations: Step-to pattern, Decreased stride length, Trunk flexed General Gait Details: pt with left trunk rotation, decreased stance on RLE, assist to control and advance RW, flexed trunk, cues for sequence and safety with chair to follow. Pt walked 26' then 22' with seated rest Gait velocity interpretation: Below normal speed for age/gender    Posture / Balance Dynamic Sitting Balance Sitting balance - Comments: Left lateral lean, can sit midline when cued, but returns to Left Balance Overall balance assessment: Needs assistance Sitting-balance support: Feet supported, No upper extremity supported Sitting balance-Leahy Scale: Fair Sitting balance - Comments: Left lateral lean, can sit midline when cued, but returns to Left Postural control: Left lateral lean Standing balance support: Bilateral upper extremity supported Standing balance-Leahy Scale: Poor Standing balance comment: MOD assist plus cues for midline stand    Special needs/care consideration BiPAP/CPAP No CPM No Continuous Drip IV No Dialysis No        Life Vest No Oxygen No Special Bed No Trach Size No Wound Vac (area) No     Skin Right hand is swollen and red, thin skin, bruises easily, R knee swelling at times.                    Bowel mgmt: Had BM 06/08/16 per wife Bladder mgmt: Condom catheter in place Diabetic mgmt No    Previous Home Environment Living Arrangements: Spouse/significant other Available Help at Discharge: Family, Available 24 hours/day Type of Home: House Home Layout: One level Bathroom Shower/Tub: Multimedia programmer: Handicapped height Bathroom Accessibility: Yes How Accessible: Accessible via wheelchair [place]: Yes Type of Home Care Services: [place] (if known): advanced care Additional Comments: Spent a long time talking with pt and family about plans.  pt was home with [facility] services - he was walking with RW and wife  following with wheelchair. He was able to transfer with wifes SBA only.  Discharge Living Setting Plans for Discharge Living Setting: Patient's home, House, Lives with (comment) (Lives with wife.) Type of Home at Discharge: House Discharge Home Layout: One level Discharge Home Access: Ramped entrance Does the patient have any problems obtaining your medications?: No  Social/Family/Support Systems Patient Roles: Spouse, Parent (Has a wife and a daughter.) Contact Information: Corrie Brannen - wife Anticipated Caregiver: wife Anticipated Caregiver's Contact Information: [name] - wife - (h) [phone] and (c) [phone] Ability/Limitations of Caregiver: Wife can provide supervision and light assist as needed Caregiver Availability: 24/7 Discharge Plan Discussed with Primary Caregiver: Yes Is Caregiver In Agreement with Plan?: Yes Does Caregiver/Family have Issues with Lodging/Transportation while Pt is in Rehab?: No  Goals/Additional Needs Patient/Family Goal for Rehab: PT/OT/SLP supervision to min assist goals Expected length of stay: 15-18 days Cultural Considerations: None Dietary Needs: Dys 1, thin liquids Equipment Needs: TBD Pt/Family Agrees to Admission  and willing to participate: Yes (Wife stays with patient most of the time.) Program Orientation Provided & Reviewed with Pt/Caregiver Including Roles  & Responsibilities: Yes  Decrease burden of Care through IP rehab admission: N/A  Possible need for SNF placement upon discharge: Not planned  Patient Condition: This patient's medical and functional status has changed since the consult dated: 06/06/16 in which the Rehabilitation Physician determined and documented that the patient's condition is appropriate for intensive rehabilitative care in an inpatient rehabilitation facility. See "History of Present Illness" (above) for medical update. Functional changes are: Currently requiring mod assist to ambulate 26 feet RW.  Patient's medical and functional status update has been discussed with the Rehabilitation physician and patient remains appropriate for inpatient rehabilitation. Will admit to inpatient rehab.  Preadmission Screen Completed By:  Retta Diones, 06/08/2016 4:31 PM ______________________________________________________________________   Discussed status with Dr. Posey Pronto on 06/08/16 at 62 and received telephone approval for admission.  Admission Coordinator:  Retta Diones, time 1630/Date 06/08/16   Updated and discussed with case manager 06/10/16.    Revision History

## 2016-06-11 NOTE — Progress Notes (Signed)
Subjective/Complaints: Pt remebers me from office, eating magic cup and graham cracker,  Denies increased shaking, Left wrist hurts with bending around IV site  ROS- neg CP. SOB. N/V/D  Objective: Vital Signs: Blood pressure 120/64, pulse 63, temperature 98.7 F (37.1 C), temperature source Oral, resp. rate 18, height 5' 7"  (1.702 m), weight 74.2 kg (163 lb 9.6 oz), SpO2 96 %. No results found. Results for orders placed or performed during the hospital encounter of 06/10/16 (from the past 72 hour(s))  CBC WITH DIFFERENTIAL     Status: Abnormal   Collection Time: 06/11/16  6:08 AM  Result Value Ref Range   WBC 7.3 4.0 - 10.5 K/uL   RBC 3.79 (L) 4.22 - 5.81 MIL/uL   Hemoglobin 11.6 (L) 13.0 - 17.0 g/dL   HCT 34.3 (L) 39.0 - 52.0 %   MCV 90.5 78.0 - 100.0 fL   MCH 30.6 26.0 - 34.0 pg   MCHC 33.8 30.0 - 36.0 g/dL   RDW 15.2 11.5 - 15.5 %   Platelets 441 (H) 150 - 400 K/uL   Neutrophils Relative % 59 %   Neutro Abs 4.2 1.7 - 7.7 K/uL   Lymphocytes Relative 27 %   Lymphs Abs 2.0 0.7 - 4.0 K/uL   Monocytes Relative 9 %   Monocytes Absolute 0.7 0.1 - 1.0 K/uL   Eosinophils Relative 5 %   Eosinophils Absolute 0.4 0.0 - 0.7 K/uL   Basophils Relative 0 %   Basophils Absolute 0.0 0.0 - 0.1 K/uL  Comprehensive metabolic panel     Status: Abnormal   Collection Time: 06/11/16  6:08 AM  Result Value Ref Range   Sodium 133 (L) 135 - 145 mmol/L   Potassium 4.1 3.5 - 5.1 mmol/L   Chloride 100 (L) 101 - 111 mmol/L   CO2 23 22 - 32 mmol/L   Glucose, Bld 99 65 - 99 mg/dL   BUN 14 6 - 20 mg/dL   Creatinine, Ser 0.96 0.61 - 1.24 mg/dL   Calcium 8.8 (L) 8.9 - 10.3 mg/dL   Total Protein 5.6 (L) 6.5 - 8.1 g/dL   Albumin 2.7 (L) 3.5 - 5.0 g/dL   AST 87 (H) 15 - 41 U/L   ALT 65 (H) 17 - 63 U/L   Alkaline Phosphatase 74 38 - 126 U/L   Total Bilirubin 0.6 0.3 - 1.2 mg/dL   GFR calc non Af Amer >60 >60 mL/min   GFR calc Af Amer >60 >60 mL/min    Comment: (NOTE) The eGFR has been calculated  using the CKD EPI equation. This calculation has not been validated in all clinical situations. eGFR's persistently <60 mL/min signify possible Chronic Kidney Disease.    Anion gap 10 5 - 15  Glucose, capillary     Status: None   Collection Time: 06/11/16  6:53 AM  Result Value Ref Range   Glucose-Capillary 98 65 - 99 mg/dL     HEENT: normal Cardio: irregular Resp: CTA B/L and unlabored GI: +BS, NT,ND Extremity:  No Edema Skin:   Other IV site Left wrist non tender without erythema Neuro: Other IV site without erythema, left wrist Musc/Skel:  Other Right knee pain and decreased ROM Gen NAD   Assessment/Plan: 1. 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 Physiatrist is providing close team supervision and 24 hour management of active medical problems listed below. Physiatrist and rehab  team continue to assess barriers to discharge/monitor patient progress toward functional and medical goals. FIM:                                   Medical Problem List and Plan: 1.  Weakness, gait abnormality, dysphagia, limitations in self-care secondary to right hippocampal infarct with recent left thalamic CVA 9/17, Left temporal ICH 1/18 2.  DVT Prophylaxis/Anticoagulation: Pharmaceutical: Other (comment)--Eliquis 3. Pain Management: Continue Voltaren gel for knee pain.  4. Mood: Wife supportive and is staying to provide psychological support.  5. Neuropsych: This patient is not  capable of making decisions on his own behalf. 6. Skin/Wound Care: Routine pressure relief measures.  7. Fluids/Electrolytes/Nutrition: Monitor I/O. Offer supplements between meals  8. A fib with RVR: Monitor HR bid--continue Eliquis, metoprolol. 9.  Leucocytosis: Continue to monitor for signs of infection.  10. GERD: Managed by Protonix.  11. Hyponatremia:  Progressive drop in Na--may need fluid restriction. Follow up in am. 12. Seizure prophylaxis: started on Keppra--monitor for tolerance.  13. Diastolic dysfunction: Monitor signs/symptoms of fluid overload. 14. Hyponatremia: improved from 130->133 15. ABLA: Hgb improved from 10.8->11.6gm LOS (Days) 1 A FACE TO FACE EVALUATION WAS PERFORMED  Shereen Marton E 06/11/2016, 7:59 AM

## 2016-06-11 NOTE — Progress Notes (Signed)
Pt c/o "just not feeling right."  Lying in bed (HOB at 20 at time)  B/P 86/41, HR 66.  Further c/o feeling lightheaded, increases with turning head. No visual changes. Denies pain/discomfort, skin W/D. Has been taking PO fluids well as offered. Algis Liming, PAC made aware; orders received.

## 2016-06-11 NOTE — Care Management Note (Signed)
[facility] Individual Statement of Services  Patient Name:  Herbert Marquez  Date:  06/11/2016  Welcome to the [facility].  Our goal is to provide you with an individualized program based on your diagnosis and situation, designed to meet your specific needs.  With this comprehensive rehabilitation program, you will be expected to participate in at least 3 hours of rehabilitation therapies Monday-Friday, with modified therapy programming on the weekends.  Your rehabilitation program will include the following services:  Physical Therapy (PT), Occupational Therapy (OT), 24 hour per day rehabilitation nursing, Case Management (Social Worker), Rehabilitation Medicine, Nutrition Services and Pharmacy Services  Weekly team conferences will be held on Wednesday to discuss your progress.  Your Social Worker will talk with you frequently to get your input and to update you on team discussions.  Team conferences with you and your family in attendance may also be held.  Expected length of stay: 12-16 days Overall anticipated outcome: supervision-min-ambulation  Depending on your progress and recovery, your program may change. Your Social Worker will coordinate services and will keep you informed of any changes. Your Social Worker's name and contact numbers are listed  below.  The following services may also be recommended but are not provided by the [facility]:    [place] will be made to provide these services after discharge if needed.  Arrangements include referral to agencies that provide these services.  Your insurance has been verified to be:  [place] Your primary doctor is:  Jacqulyn Cane  Pertinent information will be shared with your doctor and your insurance company.  Social Worker:  Ovidio Kin, [place] or ([phone]  Information  discussed with and copy given to patient by: Elease Hashimoto, 06/11/2016, 11:05 AM
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
## 2016-06-11 NOTE — Progress Notes (Signed)
Patient information reviewed and entered into eRehab system by Kayleanna Lorman, RN, CRRN, PPS Coordinator.  Information including medical coding and functional independence measure will be reviewed and updated through discharge.     Per nursing patient was given "Data Collection Information Summary for Patients in Inpatient Rehabilitation Facilities with attached "Privacy Act Statement-Health Care Records" upon admission.  

## 2016-06-11 NOTE — Progress Notes (Signed)
Therapy reports that patient with dizziness due to significant orthostatic changes. TEDs and abdominal binder ordered. Will change timing of  flomax to evenings  and have Rehab RN offer fluids between meals.

## 2016-06-11 NOTE — Progress Notes (Signed)
Occupational Therapy Note  Patient Details  Name: Herbert Marquez MRN: [id number] Date of Birth: October 14, 1927  Today's Date: 06/11/2016 OT Missed Time: 41 Minutes Missed Time Reason: MD hold (comment)  Pt on medical hold per PA secondary to pt hypotensive in supine position and receiving medications. OT will attempt initial evaluation again on 06/12/16.    Darleen Crocker P 06/11/2016, 3:02 PM

## 2016-06-12 ENCOUNTER — Inpatient Hospital Stay (HOSPITAL_COMMUNITY): Payer: Medicare Other | Admitting: Occupational Therapy

## 2016-06-12 ENCOUNTER — Inpatient Hospital Stay (HOSPITAL_COMMUNITY): Payer: Medicare Other | Admitting: Speech Pathology

## 2016-06-12 ENCOUNTER — Inpatient Hospital Stay (HOSPITAL_COMMUNITY): Payer: Medicare Other | Admitting: Physical Therapy

## 2016-06-12 DIAGNOSIS — I69993 Ataxia following unspecified cerebrovascular disease: Secondary | ICD-10-CM

## 2016-06-12 LAB — BASIC METABOLIC PANEL
Anion gap: 8 (ref 5–15)
BUN: 12 mg/dL (ref 6–20)
CO2: 24 mmol/L (ref 22–32)
Calcium: 8.9 mg/dL (ref 8.9–10.3)
Chloride: 101 mmol/L (ref 101–111)
Creatinine, Ser: 0.9 mg/dL (ref 0.61–1.24)
GFR calc Af Amer: >60 mL/min (ref >60)
GFR calc non Af Amer: >60 mL/min (ref >60)
Glucose, Bld: 99 mg/dL (ref 65–99)
Potassium: 3.9 mmol/L (ref 3.5–5.1)
Sodium: 133 mmol/L — ABNORMAL LOW (ref 135–145)

## 2016-06-12 NOTE — Progress Notes (Signed)
Speech Language Pathology Daily Session Note  Patient Details  Name: Herbert Marquez MRN: [id number] Date of Birth: [birth year]  Today's Date: 06/12/2016 SLP Individual Time: 1130-1200 SLP Individual Time Calculation (min): 30 min  Short Term Goals: Week 1: SLP Short Term Goal 1 (Week 1): Pt will consume trials of dysphagia 2 with supervision cues for use of compensatory swallow strategies and no overt s/s of aspiration.  SLP Short Term Goal 2 (Week 1): Pt will consume trials of dysphagia 3 with supervision cues for use of compensatory swallow strategies and no overt s/s of aspiration. SLP Short Term Goal 3 (Week 1): Pt will consume trials of regular with supervision cues for use of compensatory swallow strategies and no overt s/s of aspiration.   Skilled Therapeutic Interventions: Skilled treatment session focused on dysphagia goals. SLP facilitated session by providing skilled observation of pt consuming dysphagia 2 lunch tray with thin liquids via cup in a moderately distracting environment. Pt demonstrated functional oropharyngeal ability and recommend upgrade to dysphagia 2 with thin liquids. Education provided to wife and nursing. Wife is signed off to provided supervision during meals. Pt left upright in wheelchair, wife and nursing present to supervise for remainder of meal. Will attempts trials of dysphagia 3 or regular on 4/11. Continue per current plan of care.      Function:  Eating Eating   Modified Consistency Diet: Yes Eating Assist Level: Swallowing techniques: self managed           Cognition Comprehension Comprehension assist level: Follows basic conversation/direction with extra time/assistive device  Expression   Expression assist level: Expresses basic needs/ideas: With no assist  Social Interaction Social Interaction assist level: Interacts appropriately with others with medication or extra time (anti-anxiety, antidepressant).  Problem Solving Problem solving  assist level: Solves basic problems with no assist  Memory Memory assist level: Recognizes or recalls 75 - 89% of the time/requires cueing 10 - 24% of the time    Pain    Therapy/Group: Individual Therapy   Iriel Nason B. Rutherford Nail, M.S., [place] 06/12/2016, 12:11 PM

## 2016-06-12 NOTE — Progress Notes (Signed)
Orthopedic Tech Progress Note Patient Details:  Herbert Marquez 06/24/1927 [id number]  Ortho Devices Type of Ortho Device: Abdominal binder Ortho Device/Splint Location: abdomen Ortho Device/Splint Interventions: Loanne Drilling, Donyea Gafford 06/12/2016, 7:40 AM

## 2016-06-12 NOTE — Progress Notes (Signed)
Physical Therapy Session Note  Patient Details  Name: Herbert Marquez MRN: [id number] Date of Birth: 1927/03/10  Today's Date: 06/12/2016 PT Individual Time: 1000-1100 PT Individual Time Calculation (min): 60 min   Short Term Goals: Week 1:  PT Short Term Goal 1 (Week 1): Pt will be able to perform basic transfers with min assist PT Short Term Goal 2 (Week 1): Pt will be able to tolerate standing without changes in vital signs x 3 min during functional activity PT Short Term Goal 3 (Week 1): Pt will be able to gait x 25' with min assist  Skilled Therapeutic Interventions/Progress Updates: Pt presented in w/c with abdominal binder on and agreeable to therapy. No c/o dizziness at present. BP 85/61. Transferred to gym for energy conservation. Performed car transfer with modA. Upon returning to w/c pt c/o dizziness BP 106/66. Knee brace donned, gait with RW 22f with modA with cues for midline orientation upon standing. Ambulation distance limited by R knee pain which resolved with seated rest. Ascend/descend x 1 step with 2 rails with mod A and heavy use of BUE.  BP after step 130/75 HR 93. Pt propelled back to room with supervision for endurance, BP at end of session 105/55 HR 9o. Pt left in w/c at end of session with needs met.      Therapy Documentation Precautions:  Precautions Precautions: Fall, Other (comment) Precaution Comments: seizure; orthostatic - watch BPs Required Braces or Orthoses: Other Brace/Splint Other Brace/Splint: R knee brace Restrictions Weight Bearing Restrictions: No General:   Vital Signs: Therapy Vitals BP:  (sitting without binder, with THTs)  See Function Navigator for Current Functional Status.   Therapy/Group: Individual Therapy  Judee Hennick  Guida Asman, PTA  06/12/2016, 12:58 PM

## 2016-06-12 NOTE — Evaluation (Signed)
Occupational Therapy Assessment and Plan  Patient Details  Name: Herbert Marquez MRN: [id number] Date of Birth: Nov 10, 1927  OT Diagnosis: muscle weakness (generalized) Rehab Potential: Rehab Potential (ACUTE ONLY): Good ELOS: 10-14 days   Today's Date: 06/12/2016 OT Individual Time: 1287-8676 and 7209-4709 OT Individual Time Calculation (min): 24 min   And 24 min  Problem List:  Patient Active Problem List   Diagnosis Date Noted  . Embolic stroke ([place]) 62/83/6629  . Right hemiparesis ([place])   . History of CVA with residual deficit   . Chronic anticoagulation   . Atrial fibrillation with rapid ventricular response ([place])   . Seizure prophylaxis   . Diastolic dysfunction   . Bacteremia due to Gram-positive bacteria   . Altered mental status   . Dementia without behavioral disturbance   . Leukocytosis   . Acute blood loss anemia   . Acute encephalopathy 06/04/2016  . PAF (paroxysmal atrial fibrillation) ([place])   . Iron deficiency anemia 04/04/2016  . History of CVA (cerebrovascular accident) 04/04/2016  . Cerebral hemorrhage (HCC) w/ SDH s/p IV tPA   . Coronary artery disease involving native coronary artery of native heart without angina pectoris   . History of right hip replacement   . Acute ischemic stroke ([place]) - L temporal lobe s/p tPA 03/30/2016  . BPH (benign prostatic hyperplasia) 11/25/2015  . GERD (gastroesophageal reflux disease) 11/25/2015  . CKD (chronic kidney disease), stage II 11/25/2015  . Overactive bladder   . Cerebrovascular accident (CVA) ([place]) 09/18/2015  . Gait disturbance, post-stroke 06/23/2015  . 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 Thalamic infarction ([place]) 06/21/2015  . Benign essential HTN   . Type 2 diabetes mellitus with complication, without long-term current use of insulin ([place])   . Dysphagia as late effect of cerebrovascular disease   . Hyponatremia   . HLD (hyperlipidemia)   . Obesity 03/09/2011  . COPD (chronic obstructive pulmonary  disease) ([place]) 03/09/2011    Past Medical History:  Past Medical History:  Diagnosis Date  . Arthritis    "pretty much all over"   . Basal cell carcinoma    "several burned off his body, face, head"  . BPH (benign prostatic hypertrophy)   . Coronary artery disease    a. s/p PCI of RCA in 2006  . CVA (cerebral infarction)    a. 06/2015: left thalamic and bilateral PCA  . GERD (gastroesophageal reflux disease)   . Hyperlipidemia   . Hypertension   . TIA (transient ischemic attack)    Approximately 6 weeks post-cardiac catheterization.   . Type II diabetes mellitus ([place])    "prediabetic; lost alot of weight; not diabetic now" (06/16/2015)   Past Surgical History:  Past Surgical History:  Procedure Laterality Date  . CARDIOVASCULAR STRESS TEST  07/01/2007   EF 74%  . CATARACT EXTRACTION, BILATERAL    . CORONARY ANGIOPLASTY WITH STENT PLACEMENT  10/2004   stenting x 2 to RCA  . FEMUR IM NAIL Right 11/26/2015   Procedure: INTRAMEDULLARY RIGHT (IM) NAIL FEMORAL;  Surgeon: Rod Can, MD;  Location: [facility];  Service: Orthopedics;  Laterality: Right;  . HERNIA REPAIR    . HIP ARTHROPLASTY  03/09/2011   Procedure: ARTHROPLASTY BIPOLAR HIP;  Surgeon: Mauri Pole;  Location: [facility];  Service: Orthopedics;  Laterality: Left;  . LAPAROSCOPIC INCISIONAL / UMBILICAL / VENTRAL HERNIA REPAIR     "below his naval"    Assessment & Plan Clinical Impression: Herbert Marquez a 83 y.o.malewith  history of PAF, dementia, gait disorder due to OA right knee, CAD, CVA 06/2009, recent left thalamic stroke with SDH --CIR stay and discharged to home at supervision level due to ataxia. History taken from chart review and wife. He was making good progress at home and had completed HHPT. He was admitted on 06/04/16 via urology office with facial changes followed by mental status changes with agitation. MRI brain reviewed, but limited due to motion per report, secondary to severe agitation and negative  for large acute infarct. EEG done revealing moderate diffuse slowing. UA negative. Blood cultures with Staph epidermidis thought to be contaminant, but started on IV Vanc due to fevers and encephalopathy.  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   Neurology recommended continuing Eliquis for secondary stroke prevention and stroke embolic due ot CVA or carotid stenosis Dr. Shon Hale felt that encephalopathy due to seizure as patient at high risk from his recent temporal lobe infarct and recommended Keppra for treatment. Antibiotics discontinued as staph epidermis + BC felt to be due to contaminant. Pt noted to be tachycardic and evaluated by Cardiology, who increased beta blocker and cleared for admission to inpatient rehab 4/7. EEG performed showing Grade 1 diastolic dysfunction. Mentation improving and patient showing improvement in activity tolerance. CIR recommended for follow up therapy.   Patient transferred to CIR on 06/10/2016 .    Patient currently requires mod with basic self-care skills secondary to muscle weakness, decreased cardiorespiratoy endurance and decreased standing balance, decreased postural control and decreased balance strategies.  Prior to hospitalization, patient could complete ADLs with supervision- min A.  Patient will benefit from skilled intervention to decrease level of assist with basic self-care skills and increase independence with basic self-care skills prior to discharge home with care partner.  Anticipate patient will require 24 hour supervision and minimal physical assistance and follow up home health.  OT - End of Session Activity Tolerance: Tolerates 10 - 20 min activity with multiple rests Endurance Deficit: Yes Endurance Deficit Description: Required seated rest breaks throughout bathing/dressing session OT Assessment Rehab Potential (ACUTE ONLY): Good OT Patient  demonstrates impairments in the following area(s): Balance;Pain;Motor;Endurance;Skin Integrity OT Basic ADL's Functional Problem(s): Grooming;Bathing;Dressing;Toileting OT Transfers Functional Problem(s): Toilet;Tub/Shower OT Plan OT Intensity: Minimum of 1-2 x/day, 45 to 90 minutes OT Frequency: 5 out of 7 days OT Duration/Estimated Length of Stay: 10-14 days OT Treatment/Interventions: Medical illustrator training;Community reintegration;Discharge planning;DME/adaptive equipment instruction;Functional mobility training;Neuromuscular re-education;Pain management;Patient/family education;Psychosocial support;Self Care/advanced ADL retraining;Therapeutic Activities;Therapeutic Exercise;UE/LE Coordination activities;UE/LE Strength taining/ROM;Wheelchair propulsion/positioning OT Self Feeding Anticipated Outcome(s): Mod I OT Basic Self-Care Anticipated Outcome(s): supervision OT Toileting Anticipated Outcome(s): Supervision OT Bathroom Transfers Anticipated Outcome(s): Supervision- min A OT Recommendation Patient destination: Home Follow Up Recommendations: Home health OT Equipment Details: Pt has all needed DME   Skilled Therapeutic Intervention Session One: Pt seen for OT eval and ADL bathing/dressing session.Pt's eval had been deferred from yesterday due to hypotension episodes, unable to tolerate being upright.  Pt in supine upon arrival with wife present, pt agreeable to tx session and denying pain.  He transferred to EOB, BP assessed 124/61. He stood at [employer] with mod A and then with complaints of dizziness, with assist returned to sitting EOB BP 121/60. Following seated rest break pt feeling better and ready to proceed. Completed squat pivot transfer to w/c with mod A. He required increased assist with squat pivot transfers into/out of toilet to 3-1 BSC. He bathed from seated  position and with VCs for problem solving and technique pt able to laterally lean and complete buttock hygiene through  cut out of toilet, advised pt not to stand in shower due to hx of hypotensive episodes. He dressed seated in w/c with supervision and increased time, assist to pull up pants due to decreased standing balance. Pt left seated in w/c at end of session, all needs in reach. Pt educated throughout session regarding role of OT, POC, current level of function, and d/c planning.                 Session Two: Pt seen for OT session focusing on functional transfers and ADL re-training. Pt falling asleep in w/c upon arrival, easily awoken and agreeable to tx session. Completed squat pivot transfer w/c <> BSC over toilet, max A to toilet and mod A for return. He stood with heavy steadying assist to complete clothing management. Pt with strong L lean which he states is due to "bad R leg" stating he stood like that PTA. VCs for weight shift for safety in standing as pt initially required max standing assist with L lean. He returned to w/c and completed grooming task from w/c level with set-up. Throughout session, he required seated rest breaks following all transfers and standing trials due to decreased activity tolerance.  Pt left seated in w/c at end of session, all needs in reach.  OT Evaluation Precautions/Restrictions  Precautions Precautions: Fall;Other (comment) Precaution Comments: seizure; orthostatic - watch BPs Required Braces or Orthoses: Other Brace/Splint Other Brace/Splint: R knee brace Restrictions Weight Bearing Restrictions: No General Chart Reviewed: Yes Additional Pertinent History: Hx R CVA Home Living/Prior Functioning Home Living Family/patient expects to be discharged to:: Private residence Living Arrangements: Spouse/significant other Available Help at Discharge: Family, Available 24 hours/day Type of Home: House Home Access: Ramped entrance Home Layout: One level Bathroom Shower/Tub: Holiday representative Accessibility: Yes  Lives With: Spouse Prior Function Level of Independence: Requires assistive device for independence, Needs assistance with gait, Independent with transfers Vocation: Retired Comments: Pt was primarily w/c level PTA. He was independent with basic transfers with wife providing overall supervision and set-up. She reports he could dress and bathe himself but sometimes she would assist for time management. Pt using w/c in the home for mobility but was walking with HHPT and wife providing assistance with RW. Vision/Perception  Vision- History Baseline Vision/History: No visual deficits Patient Visual Report: No change from baseline  Cognition Overall Cognitive Status: History of cognitive impairments - at baseline Arousal/Alertness: Awake/alert Orientation Level: Person;Place;Situation Person: Oriented Place: Oriented Situation: Oriented Year: 2018 Month: April Day of Week: Correct Memory: Impaired (Hx of dementia) Immediate Memory Recall:  Sock;Blue;Bed Memory Recall: Sock;Blue;Bed Memory Recall Sock: Without Cue Memory Recall Blue: Without Cue Memory Recall Bed: Without Cue Attention: Sustained Sustained Attention: Appears intact Awareness: Appears intact Problem Solving: Appears intact Safety/Judgment: Appears intact Sensation Sensation Light Touch: Appears Intact Additional Comments: denies residual deficits from prior CVA Coordination Gross Motor Movements are Fluid and Coordinated: No Fine Motor Movements are Fluid and Coordinated: No Coordination and Movement Description: Generalized weakness/ deconditioned. Impaired proprioception in R UE/LE Finger Nose Finger Test: Decreased speed and accuracy R UE; WFL L UE Motor  Motor Motor: Abnormal postural alignment and control Motor - Skilled Clinical Observations: posterior lean in standing Trunk/Postural Assessment  Cervical Assessment Cervical Assessment: Exceptions to [facility] (Forward head and rounded shoulders) Thoracic Assessment Thoracic Assessment: Exceptions to [facility] (Kyphotic) Lumbar Assessment Lumbar Assessment: Exceptions to [facility] (Posterior pelvic tilt) Postural Control Postural Control: Deficits on evaluation  Balance Balance Balance Assessed: Yes Static Sitting Balance Static Sitting - Balance Support: Feet supported Static Sitting - Level of Assistance: 5: Stand by assistance Dynamic Sitting Balance Dynamic Sitting - Balance Support: Feet supported;During functional activity Dynamic Sitting - Level of Assistance: 5: Stand by assistance;4: Min assist Sitting balance - Comments: Sitting to complete bathing/dressing tasks Static Standing Balance Static Standing - Balance Support: During functional activity;Bilateral upper extremity supported Static Standing - Level of Assistance: 4: Min assist Extremity/Trunk Assessment RUE Assessment RUE Assessment: Exceptions to [facility] (Strength WFL; hx of decreased proprioception following CVA) LUE Assessment LUE  Assessment: Within Functional Limits   See Function Navigator for Current Functional Status.   Refer to Care Plan for Long Term Goals  Recommendations for other services: None    Discharge Criteria: Patient will be discharged from OT if patient refuses treatment 3 consecutive times without medical reason, if treatment goals not met, if there is a change in medical status, if patient makes no progress towards goals or if patient is discharged from hospital.  The above assessment, treatment plan, treatment alternatives and goals were discussed and mutually agreed upon: by patient and by family  Lewis, Jencarlos Nicolson C 06/12/2016, 12:12 PM

## 2016-06-12 NOTE — Progress Notes (Signed)
Subjective/Complaints: Orthostatic episodes yesterday, good urine output after IVF   ROS- neg CP. SOB. N/V/D  Objective: Vital Signs: Blood pressure (!) 144/98, pulse 64, temperature 98.9 F (37.2 C), temperature source Oral, resp. rate 18, height 5' 7"  (1.702 m), weight 75 kg (165 lb 5.5 oz), SpO2 96 %. No results found. Results for orders placed or performed during the hospital encounter of 06/10/16 (from the past 72 hour(s))  CBC WITH DIFFERENTIAL     Status: Abnormal   Collection Time: 06/11/16  6:08 AM  Result Value Ref Range   WBC 7.3 4.0 - 10.5 K/uL   RBC 3.79 (L) 4.22 - 5.81 MIL/uL   Hemoglobin 11.6 (L) 13.0 - 17.0 g/dL   HCT 34.3 (L) 39.0 - 52.0 %   MCV 90.5 78.0 - 100.0 fL   MCH 30.6 26.0 - 34.0 pg   MCHC 33.8 30.0 - 36.0 g/dL   RDW 15.2 11.5 - 15.5 %   Platelets 441 (H) 150 - 400 K/uL   Neutrophils Relative % 59 %   Neutro Abs 4.2 1.7 - 7.7 K/uL   Lymphocytes Relative 27 %   Lymphs Abs 2.0 0.7 - 4.0 K/uL   Monocytes Relative 9 %   Monocytes Absolute 0.7 0.1 - 1.0 K/uL   Eosinophils Relative 5 %   Eosinophils Absolute 0.4 0.0 - 0.7 K/uL   Basophils Relative 0 %   Basophils Absolute 0.0 0.0 - 0.1 K/uL  Comprehensive metabolic panel     Status: Abnormal   Collection Time: 06/11/16  6:08 AM  Result Value Ref Range   Sodium 133 (L) 135 - 145 mmol/L   Potassium 4.1 3.5 - 5.1 mmol/L   Chloride 100 (L) 101 - 111 mmol/L   CO2 23 22 - 32 mmol/L   Glucose, Bld 99 65 - 99 mg/dL   BUN 14 6 - 20 mg/dL   Creatinine, Ser 0.96 0.61 - 1.24 mg/dL   Calcium 8.8 (L) 8.9 - 10.3 mg/dL   Total Protein 5.6 (L) 6.5 - 8.1 g/dL   Albumin 2.7 (L) 3.5 - 5.0 g/dL   AST 87 (H) 15 - 41 U/L   ALT 65 (H) 17 - 63 U/L   Alkaline Phosphatase 74 38 - 126 U/L   Total Bilirubin 0.6 0.3 - 1.2 mg/dL   GFR calc non Af Amer >60 >60 mL/min   GFR calc Af Amer >60 >60 mL/min    Comment: (NOTE) The eGFR has been calculated using the CKD EPI equation. This calculation has not been validated in all  clinical situations. eGFR's persistently <60 mL/min signify possible Chronic Kidney Disease.    Anion gap 10 5 - 15  Glucose, capillary     Status: None   Collection Time: 06/11/16  6:53 AM  Result Value Ref Range   Glucose-Capillary 98 65 - 99 mg/dL  Basic metabolic panel     Status: Abnormal   Collection Time: 06/11/16  3:11 PM  Result Value Ref Range   Sodium 127 (L) 135 - 145 mmol/L   Potassium 4.3 3.5 - 5.1 mmol/L    Comment: SLIGHT HEMOLYSIS   Chloride 96 (L) 101 - 111 mmol/L   CO2 18 (L) 22 - 32 mmol/L   Glucose, Bld 111 (H) 65 - 99 mg/dL   BUN 17 6 - 20 mg/dL   Creatinine, Ser 0.95 0.61 - 1.24 mg/dL   Calcium 8.3 (L) 8.9 - 10.3 mg/dL   GFR calc non Af Amer >60 >60 mL/min   GFR  calc Af Amer >60 >60 mL/min    Comment: (NOTE) The eGFR has been calculated using the CKD EPI equation. This calculation has not been validated in all clinical situations. eGFR's persistently <60 mL/min signify possible Chronic Kidney Disease.    Anion gap 13 5 - 15  CBC     Status: Abnormal   Collection Time: 06/11/16  3:11 PM  Result Value Ref Range   WBC 7.7 4.0 - 10.5 K/uL   RBC 3.79 (L) 4.22 - 5.81 MIL/uL   Hemoglobin 11.8 (L) 13.0 - 17.0 g/dL   HCT 34.1 (L) 39.0 - 52.0 %   MCV 90.0 78.0 - 100.0 fL   MCH 31.1 26.0 - 34.0 pg   MCHC 34.6 30.0 - 36.0 g/dL   RDW 15.3 11.5 - 15.5 %   Platelets 358 150 - 400 K/uL  Basic metabolic panel     Status: Abnormal   Collection Time: 06/12/16  5:49 AM  Result Value Ref Range   Sodium 133 (L) 135 - 145 mmol/L   Potassium 3.9 3.5 - 5.1 mmol/L   Chloride 101 101 - 111 mmol/L   CO2 24 22 - 32 mmol/L   Glucose, Bld 99 65 - 99 mg/dL   BUN 12 6 - 20 mg/dL   Creatinine, Ser 0.90 0.61 - 1.24 mg/dL   Calcium 8.9 8.9 - 10.3 mg/dL   GFR calc non Af Amer >60 >60 mL/min   GFR calc Af Amer >60 >60 mL/min    Comment: (NOTE) The eGFR has been calculated using the CKD EPI equation. This calculation has not been validated in all clinical situations. eGFR's  persistently <60 mL/min signify possible Chronic Kidney Disease.    Anion gap 8 5 - 15     HEENT: normal Cardio: irregular Resp: CTA B/L and unlabored GI: +BS, NT,ND Extremity:  No Edema Skin:   Other IV site Left wrist non tender without erythema Neuro: Other IV site without erythema, left wrist Musc/Skel:  Other Right knee pain and decreased ROM Gen NAD   Assessment/Plan: 1. 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 Physiatrist is providing close team supervision and 24 hour management of active medical problems listed below. Physiatrist and rehab team continue to assess barriers to discharge/monitor patient progress toward functional and medical goals. FIM:    Function- Upper Body Dressing/Undressing What is the patient wearing?: Pull over shirt/dress Pull over shirt/dress - Perfomed by patient: Thread/unthread right sleeve, Thread/unthread left sleeve, Put head through opening, Pull shirt over trunk Assist Level: Supervision or verbal cues        Function - Chair/bed transfer Chair/bed transfer method: Squat pivot Chair/bed transfer assist level: Moderate assist (Pt 50 - 74%/lift or lower) Chair/bed transfer assistive device: Armrests  Function - Locomotion: Wheelchair Will patient use wheelchair at discharge?: Yes Type: Manual Max wheelchair distance: 150' Assist Level: Supervision or verbal cues Assist Level: Supervision or verbal cues Assist Level: Supervision or verbal cues Turns around,maneuvers to table,bed, and toilet,negotiates 3% grade,maneuvers on rugs and over doorsills: No Function - Locomotion: Ambulation Ambulation activity did not occur: Safety/medical concerns (orthostatic with minimal standing) Walk 10 feet activity did not occur: Safety/medical concerns Walk 50 feet with 2 turns activity did not occur:  Safety/medical concerns Walk 150 feet activity did not occur: Safety/medical concerns Walk 10 feet on uneven surfaces activity did not occur: Safety/medical concerns  Function - Comprehension  Comprehension: Auditory Comprehension assistive device: Hearing aids Comprehension assist level: Follows basic conversation/direction with extra time/assistive device  Function - Expression Expression: Verbal Expression assist level: Expresses basic needs/ideas: With extra time/assistive device  Function - Social Interaction Social Interaction assist level: Interacts appropriately with others with medication or extra time (anti-anxiety, antidepressant).  Function - Problem Solving Problem solving assist level: Solves basic problems with no assist  Function - Memory Memory assist level: Recognizes or recalls 75 - 89% of the time/requires cueing 10 - 24% of the time Patient normally able to recall (first 3 days only): That he or she is in a hospital, Staff names and faces, Current season   Medical Problem List and Plan: 1.  Weakness, gait abnormality, dysphagia, limitations in self-care secondary to right hippocampal infarct with recent left thalamic CVA 9/17, Left temporal ICH 1/18 CIR PT, OT, SLP 2.  DVT Prophylaxis/Anticoagulation: Pharmaceutical: Other (comment)--Eliquis 3. Pain Management: Continue Voltaren gel for knee pain.  4. Mood: Wife supportive and is staying to provide psychological support.  5. Neuropsych: This patient is not  capable of making decisions on his own behalf. 6. Skin/Wound Care: Routine pressure relief measures.  7. Fluids/Electrolytes/Nutrition: Monitor I/O. Offer supplements between meals  8. A fib with RVR: Monitor HR bid--continue Eliquis, metoprolol. 9.  Leucocytosis: Continue to monitor for signs of infection.  10. GERD: Managed by Protonix.  11. Hyponatremia: Progressive drop in Na--may need fluid restriction. Follow up in am. 12. Seizure prophylaxis: started  on Keppra--monitor for tolerance.  13. Diastolic dysfunction: Monitor signs/symptoms of fluid overload. 14. Hyponatremia: improved from 130->133 15. ABLA: Hgb improved from 10.8->11.6->11.8gm 16.  Orthoostatic hypotension likely flomax plus reduced fluid intake, resting BP increased to day will d/c flomax and monitor for urinary retention, TEDs and abd binder LOS (Days) 2 A FACE TO FACE EVALUATION WAS PERFORMED  Juliauna Stueve E 06/12/2016, 8:11 AM

## 2016-06-13 ENCOUNTER — Inpatient Hospital Stay (HOSPITAL_COMMUNITY): Payer: Medicare Other | Admitting: Speech Pathology

## 2016-06-13 ENCOUNTER — Inpatient Hospital Stay (HOSPITAL_COMMUNITY): Payer: Medicare Other | Admitting: Physical Therapy

## 2016-06-13 ENCOUNTER — Inpatient Hospital Stay (HOSPITAL_COMMUNITY): Payer: Medicare Other | Admitting: *Deleted

## 2016-06-13 ENCOUNTER — Inpatient Hospital Stay (HOSPITAL_COMMUNITY): Payer: Medicare Other | Admitting: Occupational Therapy

## 2016-06-13 NOTE — Patient Care Conference (Signed)
Inpatient RehabilitationTeam Conference and Plan of Care Update Date: 06/13/2016   Time: 10:45 AM    Patient Name: Herbert Marquez      Medical Record Number: [id number]  Date of Birth: [birth year] Sex: Male         Room/Bed: 4M08C/4M08C-01 Payor Info: Payor: MEDICARE / Plan: MEDICARE PART A AND B / Product Type: *No Product type* /    Admitting Diagnosis: CVA  Admit Date/Time:  06/10/2016  4:42 PM Admission Comments: No comment available   Primary Diagnosis:  <principal problem not specified> Principal Problem: <principal problem not specified>  Patient Active Problem List   Diagnosis Date Noted  . Embolic stroke ([place]) 08/26/7626  . Right hemiparesis ([place])   . History of CVA with residual deficit   . Chronic anticoagulation   . Atrial fibrillation with rapid ventricular response ([place])   . Seizure prophylaxis   . Diastolic dysfunction   . Bacteremia due to Gram-positive bacteria   . Altered mental status   . Dementia without behavioral disturbance   . Leukocytosis   . Acute blood loss anemia   . Acute encephalopathy 06/04/2016  . PAF (paroxysmal atrial fibrillation) ([place])   . Iron deficiency anemia 04/04/2016  . History of CVA (cerebrovascular accident) 04/04/2016  . Cerebral hemorrhage (HCC) w/ SDH s/p IV tPA   . Coronary artery disease involving native coronary artery of native heart without angina pectoris   . History of right hip replacement   . Acute ischemic stroke ([place]) - L temporal lobe s/p tPA 03/30/2016  . BPH (benign prostatic hyperplasia) 11/25/2015  . GERD (gastroesophageal reflux disease) 11/25/2015  . CKD (chronic kidney disease), stage II 11/25/2015  . Overactive bladder   . Cerebrovascular accident (CVA) ([place]) 09/18/2015  . Gait disturbance, post-stroke 06/23/2015  . 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 Thalamic infarction ([place]) 06/21/2015  . Benign essential HTN   . Type 2 diabetes mellitus with complication, without long-term current use of insulin  ([place])   . Dysphagia as late effect of cerebrovascular disease   . Hyponatremia   . HLD (hyperlipidemia)   . Obesity 03/09/2011  . COPD (chronic obstructive pulmonary disease) ([place]) 03/09/2011    Expected Discharge Date: Expected Discharge Date: 06/22/16  Team Members Present: Physician leading conference: Ilean Skill, PsyD;Dr. Alysia Penna Social Worker Present: Ovidio Kin, LCSW Nurse Present: Heather Roberts, RN PT Present: Barrie Folk, PT;Rosita Dechalus, PTA OT Present: Napoleon Form, OT SLP Present: Stormy Fabian, SLP PPS Coordinator present : Daiva Nakayama, RN, CRRN     Current Status/Progress Goal Weekly Team Focus  Medical   Orthostatic hypotension resolved, chronic Right knee pain, currently Mod A  maintain BP in desired range  manage orthostasis   Bowel/Bladder   Continent Bowel LBM 4/10, incontinent of urine at times  Be continent of B/B   Timed toilet during day, assist with urinal   Swallow/Nutrition/ Hydration   Supervision with dysphagia 1 with thin liquids  Supervision with least restrictive diet  skilled bservation of dysphagia 2 and 3 textures and use of compensatory swallow strategies   ADL's   Mod A transfers; mod A LB bathing/dresing and toileting; Supervision UB bathing/dressing and grooming  Supervision overall; min A shower transfers  functional transfers, activity tolerance, activity tolerance, strengthening, d/c planning   Mobility   supervision w/c mobilty, modA gait 76ft with RW, modA transfers with RW  supervision transfers, mod I w/c mobilty, minA gait  OOB tolerance, transfers, balance, w/c mobilty    Communication  Safety/Cognition/ Behavioral Observations  min assist calls for assist-wife at bedisde   min assist   Keep call bell and belongings within reach   Pain   Denies pain   Less than 3 out of 10  Asses and treat pain q shift/PRN   Skin   Stage II to coccyx-Foam  No new skin breakdown  Assess skin q shift       *See Care  Plan and progress notes for long and short-term goals.  Barriers to Discharge: mod A current baseline Supervision    Possible Resolutions to Barriers:  cont rehab program    Discharge Planning/Teaching Needs:  Return home with wife assisting with his care. Hoping he does well here. Wife here daily and participates in therapies with pt.      Team Discussion:  Goals supervision-min assist level. BP more controlled today-MD adjusted medicines. Speech to trial regular diet today at lunch if does well will DC. Stage 2 on sacrum-foam dressing on-RN monitoring. Motivated to do therapies and make progress while here.  Revisions to Treatment Plan:  DC 4/20   Continued Need for Acute Rehabilitation Level of Care: The patient requires daily medical management by a physician with specialized training in physical medicine and rehabilitation for the following conditions: Daily direction of a multidisciplinary physical rehabilitation program to ensure safe treatment while eliciting the highest outcome that is of practical value to the patient.: Yes Daily medical management of patient stability for increased activity during participation in an intensive rehabilitation regime.: Yes Daily analysis of laboratory values and/or radiology reports with any subsequent need for medication adjustment of medical intervention for : Neurological problems  Herbert Marquez, Herbert Marquez 06/13/2016, 3:22 PM

## 2016-06-13 NOTE — Progress Notes (Addendum)
Subjective/Complaints:  Had cont BM this am, voiding, no dizziness during ADL  ROS- neg CP. SOB. N/V/D  Objective: Vital Signs: Blood pressure 120/62, pulse 69, temperature 97.6 F (36.4 C), temperature source Oral, resp. rate 16, height 5' 7"  (1.702 m), weight 75.1 kg (165 lb 9.1 oz), SpO2 95 %. No results found. Results for orders placed or performed during the hospital encounter of 06/10/16 (from the past 72 hour(s))  CBC WITH DIFFERENTIAL     Status: Abnormal   Collection Time: 06/11/16  6:08 AM  Result Value Ref Range   WBC 7.3 4.0 - 10.5 K/uL   RBC 3.79 (L) 4.22 - 5.81 MIL/uL   Hemoglobin 11.6 (L) 13.0 - 17.0 g/dL   HCT 34.3 (L) 39.0 - 52.0 %   MCV 90.5 78.0 - 100.0 fL   MCH 30.6 26.0 - 34.0 pg   MCHC 33.8 30.0 - 36.0 g/dL   RDW 15.2 11.5 - 15.5 %   Platelets 441 (H) 150 - 400 K/uL   Neutrophils Relative % 59 %   Neutro Abs 4.2 1.7 - 7.7 K/uL   Lymphocytes Relative 27 %   Lymphs Abs 2.0 0.7 - 4.0 K/uL   Monocytes Relative 9 %   Monocytes Absolute 0.7 0.1 - 1.0 K/uL   Eosinophils Relative 5 %   Eosinophils Absolute 0.4 0.0 - 0.7 K/uL   Basophils Relative 0 %   Basophils Absolute 0.0 0.0 - 0.1 K/uL  Comprehensive metabolic panel     Status: Abnormal   Collection Time: 06/11/16  6:08 AM  Result Value Ref Range   Sodium 133 (L) 135 - 145 mmol/L   Potassium 4.1 3.5 - 5.1 mmol/L   Chloride 100 (L) 101 - 111 mmol/L   CO2 23 22 - 32 mmol/L   Glucose, Bld 99 65 - 99 mg/dL   BUN 14 6 - 20 mg/dL   Creatinine, Ser 0.96 0.61 - 1.24 mg/dL   Calcium 8.8 (L) 8.9 - 10.3 mg/dL   Total Protein 5.6 (L) 6.5 - 8.1 g/dL   Albumin 2.7 (L) 3.5 - 5.0 g/dL   AST 87 (H) 15 - 41 U/L   ALT 65 (H) 17 - 63 U/L   Alkaline Phosphatase 74 38 - 126 U/L   Total Bilirubin 0.6 0.3 - 1.2 mg/dL   GFR calc non Af Amer >60 >60 mL/min   GFR calc Af Amer >60 >60 mL/min    Comment: (NOTE) The eGFR has been calculated using the CKD EPI equation. This calculation has not been validated in all clinical  situations. eGFR's persistently <60 mL/min signify possible Chronic Kidney Disease.    Anion gap 10 5 - 15  Glucose, capillary     Status: None   Collection Time: 06/11/16  6:53 AM  Result Value Ref Range   Glucose-Capillary 98 65 - 99 mg/dL  Basic metabolic panel     Status: Abnormal   Collection Time: 06/11/16  3:11 PM  Result Value Ref Range   Sodium 127 (L) 135 - 145 mmol/L   Potassium 4.3 3.5 - 5.1 mmol/L    Comment: SLIGHT HEMOLYSIS   Chloride 96 (L) 101 - 111 mmol/L   CO2 18 (L) 22 - 32 mmol/L   Glucose, Bld 111 (H) 65 - 99 mg/dL   BUN 17 6 - 20 mg/dL   Creatinine, Ser 0.95 0.61 - 1.24 mg/dL   Calcium 8.3 (L) 8.9 - 10.3 mg/dL   GFR calc non Af Amer >60 >60 mL/min  GFR calc Af Amer >60 >60 mL/min    Comment: (NOTE) The eGFR has been calculated using the CKD EPI equation. This calculation has not been validated in all clinical situations. eGFR's persistently <60 mL/min signify possible Chronic Kidney Disease.    Anion gap 13 5 - 15  CBC     Status: Abnormal   Collection Time: 06/11/16  3:11 PM  Result Value Ref Range   WBC 7.7 4.0 - 10.5 K/uL   RBC 3.79 (L) 4.22 - 5.81 MIL/uL   Hemoglobin 11.8 (L) 13.0 - 17.0 g/dL   HCT 34.1 (L) 39.0 - 52.0 %   MCV 90.0 78.0 - 100.0 fL   MCH 31.1 26.0 - 34.0 pg   MCHC 34.6 30.0 - 36.0 g/dL   RDW 15.3 11.5 - 15.5 %   Platelets 358 150 - 400 K/uL  Basic metabolic panel     Status: Abnormal   Collection Time: 06/12/16  5:49 AM  Result Value Ref Range   Sodium 133 (L) 135 - 145 mmol/L   Potassium 3.9 3.5 - 5.1 mmol/L   Chloride 101 101 - 111 mmol/L   CO2 24 22 - 32 mmol/L   Glucose, Bld 99 65 - 99 mg/dL   BUN 12 6 - 20 mg/dL   Creatinine, Ser 0.90 0.61 - 1.24 mg/dL   Calcium 8.9 8.9 - 10.3 mg/dL   GFR calc non Af Amer >60 >60 mL/min   GFR calc Af Amer >60 >60 mL/min    Comment: (NOTE) The eGFR has been calculated using the CKD EPI equation. This calculation has not been validated in all clinical situations. eGFR's  persistently <60 mL/min signify possible Chronic Kidney Disease.    Anion gap 8 5 - 15     HEENT: normal Cardio: irregular Resp: CTA B/L and unlabored GI: +BS, NT,ND Extremity:  No Edema Skin:   Other IV site Left wrist non tender without erythema Neuro: Other IV site without erythema, left wrist Musc/Skel:  Other Right knee pain and decreased ROM Gen NAD   Assessment/Plan: 1. 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 Physiatrist is providing close team supervision and 24 hour management of active medical problems listed below. Physiatrist and rehab team continue to assess barriers to discharge/monitor patient progress toward functional and medical goals. FIM: Function - Bathing Position: Shower Body parts bathed by patient: Right arm, Buttocks, Left lower leg, Back, Right upper leg, Left arm, Chest, Abdomen, Left upper leg, Right lower leg, Front perineal area Assist Level: Touching or steadying assistance(Pt > 75%)  Function- Upper Body Dressing/Undressing What is the patient wearing?: Pull over shirt/dress Pull over shirt/dress - Perfomed by patient: Thread/unthread right sleeve, Thread/unthread left sleeve, Put head through opening, Pull shirt over trunk Assist Level: Supervision or verbal cues, Set up Set up : To obtain clothing/put away Function - Lower Body Dressing/Undressing What is the patient wearing?: Socks, Shoes, [employer], Pants, Underwear Position: Education officer, museum at [employer] - Performed by patient: Thread/unthread right underwear leg, Thread/unthread left underwear leg Underwear - Performed by helper: Pull underwear up/down Pants- Performed by patient: Thread/unthread right pants leg, Thread/unthread left pants leg Pants- Performed by helper: Pull pants up/down Socks - Performed by patient: Don/doff right sock,  Don/doff left sock Shoes - Performed by patient: Don/doff right shoe, Don/doff left shoe Shoes - Performed by helper: Fasten right, Fasten left TED Hose - Performed  by helper: Don/doff right TED hose, Don/doff left TED hose Assist for footwear: Supervision/touching assist Assist for lower body dressing: Touching or steadying assistance (Pt > 75%)  Function - Toileting Toileting steps completed by patient: Adjust clothing prior to toileting, Performs perineal hygiene Toileting steps completed by helper: Adjust clothing after toileting Toileting Assistive Devices: Grab bar or rail Assist level: Touching or steadying assistance (Pt.75%)  Function - Toilet Transfers Toilet transfer assistive device: Elevated toilet seat/BSC over toilet, Grab bar Assist level to toilet: Maximal assist (Pt 25 - 49%/lift and lower) Assist level from toilet: Moderate assist (Pt 50 - 74%/lift or lower)  Function - Chair/bed transfer Chair/bed transfer method: Squat pivot Chair/bed transfer assist level: Moderate assist (Pt 50 - 74%/lift or lower) Chair/bed transfer assistive device: Armrests  Function - Locomotion: Wheelchair Will patient use wheelchair at discharge?: Yes Type: Manual Max wheelchair distance: 150' Assist Level: Supervision or verbal cues Assist Level: Supervision or verbal cues Assist Level: Supervision or verbal cues Turns around,maneuvers to table,bed, and toilet,negotiates 3% grade,maneuvers on rugs and over doorsills: No Function - Locomotion: Ambulation Ambulation activity did not occur: Safety/medical concerns (orthostatic with minimal standing) Assistive device: Walker-rolling Max distance: 10 ft Assist level: Moderate assist (Pt 50 - 74%) Walk 10 feet activity did not occur: Safety/medical concerns Assist level: Moderate assist (Pt 50 - 74%) Walk 50 feet with 2 turns activity did not occur: Safety/medical concerns Walk 150 feet activity did not occur: Safety/medical  concerns Walk 10 feet on uneven surfaces activity did not occur: Safety/medical concerns  Function - Comprehension Comprehension: Auditory Comprehension assistive device: Hearing aids Comprehension assist level: Understands basic 90% of the time/cues < 10% of the time  Function - Expression Expression: Verbal Expression assist level: Expresses basic 90% of the time/requires cueing < 10% of the time.  Function - Social Interaction Social Interaction assist level: Interacts appropriately 90% of the time - Needs monitoring or encouragement for participation or interaction.  Function - Problem Solving Problem solving assist level: Solves basic problems with no assist  Function - Memory Memory assist level: Recognizes or recalls 75 - 89% of the time/requires cueing 10 - 24% of the time Patient normally able to recall (first 3 days only): That he or she is in a hospital, Staff names and faces, Current season   Medical Problem List and Plan: 1.  Weakness, gait abnormality, dysphagia, limitations in self-care secondary to right hippocampal infarct with recent left thalamic CVA 9/17, Left temporal [place] 1/18 Team conference today please see physician documentation under team conference tab, met with team face-to-face to discuss problems,progress, and goals. Formulized individual treatment plan based on medical history, underlying problem and comorbidities. 2.  DVT Prophylaxis/Anticoagulation: Pharmaceutical: Other (comment)--Eliquis 3. Pain Management: Continue Voltaren gel for knee pain.  4. Mood: Wife supportive and is staying to provide psychological support.  5. Neuropsych: This patient is not  capable of making decisions on his own behalf. 6. Skin/Wound Care: Routine pressure relief measures.  7. Fluids/Electrolytes/Nutrition: Monitor I/O. Offer supplements between meals  8. A fib with RVR: Monitor HR bid--continue Eliquis, metoprolol. 9.  Leucocytosis: Continue to monitor for signs of  infection.  10. GERD: Managed by Protonix.  11. Hyponatremia: Progressive drop in Na--may need fluid restriction. Follow up in am. 12. Seizure prophylaxis: started on Keppra--monitor for tolerance.  13. Diastolic dysfunction: Monitor signs/symptoms of fluid overload. 14. Hyponatremia: improved from 130->133 15. ABLA: Hgb improved from 10.8->11.6->11.8gm 16.  Orthostatic hypotension likely flomax plus reduced fluid intake,off flomax  still voidingLOS (Days) 3 A FACE TO FACE EVALUATION WAS PERFORMED  Herbert Marquez E 06/13/2016, 8:14 AM

## 2016-06-13 NOTE — Progress Notes (Signed)
Physical Therapy Session Note  Patient Details  Name: Herbert Marquez MRN: [id number] Date of Birth: Feb 03, 1928  Today's Date: 06/13/2016 PT Individual Time: 1345-1445 PT Individual Time Calculation (min): 60 min   Short Term Goals: Week 1:  PT Short Term Goal 1 (Week 1): Pt will be able to perform basic transfers with min assist PT Short Term Goal 2 (Week 1): Pt will be able to tolerate standing without changes in vital signs x 3 min during functional activity PT Short Term Goal 3 (Week 1): Pt will be able to gait x 25' with min assist Week 2:     Skilled Therapeutic Interventions/Progress Updates:    Pt received sitting in WC and agreeable to PT Squat pivot transfer to [facility] with min assist due to poor stability on Wheel locks on WC>   WC mobility through hall with BUE propulsion and supervision assist to prevent use of UE to pull on rails in hall.   Gait training instructed by PT x 33f, 10 ft and 177fwith min assist from PT. No R knee instability noted with gait using RW for BUE support. Cues for safety with retro gait while turning to sitting into chair.  Pt reports mild dzziness after 3 bouts of gait training. Orthostatic vitals assessed.  Sitting: 124/70. Standing: 88/63. Pt asyptomatic. Binder applied  Standing vitals re-assessed: 1104/62 at 0 min and 112/53 at 3 minutes.   BUE therex on UE ergometer x 10 minutes total forward and backward level 4>5 with 2 minute rest break at 5 minutes.   Stand pivot transfer training x 5 throughout treatment with min assist from PT. And cues for improved anterior weight shift with transition to standing, difficulty due to knee pain on RLE.  [facility] returned too room and [facility] reach and all needs met.             Therapy Documentation Precautions:  Precautions Precautions: Fall, Other (comment) Precaution Comments: seizure; orthostatic - watch BPs Required Braces or Orthoses: Other Brace/Splint Other  Brace/Splint: R knee brace Restrictions Weight Bearing Restrictions: No    Pain: 0/10 at rest.  See Function Navigator for Current Functional Status.   Therapy/Group: Individual Therapy  AuLorie Phenix/01/2017, 5:00 PM

## 2016-06-13 NOTE — Progress Notes (Signed)
Social Work Patient ID: Herbert Marquez, male   DOB: 08-11-1927, 83 y.o.   MRN: [id number]  Met with pt and wife to discuss team conference goals-supervision-min assist level and target discharge date 4/20. Both pleased with how well he is doing today and he feels better with his HTN also. Wife is here daily and participates in therapies with pt. Work toward discharge next Friday.

## 2016-06-13 NOTE — Progress Notes (Signed)
Occupational Therapy Session Note  Patient Details  Name: Herbert Marquez MRN: [id number] Date of Birth: 1927-08-03  Today's Date: 06/13/2016 OT Individual Time: 0277-4128 OT Individual Time Calculation (min): 60 min    Short Term Goals: Week 1:  OT Short Term Goal 1 (Week 1): Pt will complete squat pivot transfer to toilet with min A OT Short Term Goal 2 (Week 1): Pt will dress LB with steadying assist OT Short Term Goal 3 (Week 1): Pt will complete 1 grooming task in standing with min A for functional activity tolerance OT Short Term Goal 4 (Week 1): Pt will complete toileting task with mod A  Skilled Therapeutic Interventions/Progress Updates:    Pt seen for OT ADL bathing/dressing session. Pt sitting up in bed upon arrival eating breakfast with wfie present. He declined getting to EOB to finish meal. While pt finished eating, discussed with pt and caregiver d/c planning and OT/PT goals. Pt eager to get home as he is not sleeping well in hospital but agrees to wanting to work to get to supervision level. Throughout session, completed squat pivot transfers with mod A. Required mod A standing balance with VCs for weight shift to right during dynamic standing tasks during toileting and LB dressing. Pt required significantly increased time and rest breaks to complete all ADL tasks. Pt left seated in w/c at end of session, wife present, and all needs in reach.   Therapy Documentation Precautions:  Precautions Precautions: Fall, Other (comment) Precaution Comments: seizure; orthostatic - watch BPs Required Braces or Orthoses: Other Brace/Splint Other Brace/Splint: R knee brace Restrictions Weight Bearing Restrictions: No Pain:   No/ denies pain  See Function Navigator for Current Functional Status.   Therapy/Group: Individual Therapy  Lewis, Alannie Amodio C 06/13/2016, 7:11 AM

## 2016-06-13 NOTE — Progress Notes (Signed)
Social Work Elease Hashimoto, LCSW Social Worker Signed   Patient Care Conference Date of Service: 06/13/2016  3:22 PM      Hide copied text Hover for attribution information Inpatient RehabilitationTeam Conference and Plan of Care Update Date: 06/13/2016   Time: 10:45 AM      Patient Name: Herbert Marquez      Medical Record Number: [id number]  Date of Birth: Dec 22, 1927 Sex: Male         Room/Bed: 4M08C/4M08C-01 Payor Info: Payor: MEDICARE / Plan: MEDICARE PART A AND B / Product Type: *No Product type* /     Admitting Diagnosis: CVA  Admit Date/Time:  06/10/2016  4:42 PM Admission Comments: No comment available    Primary Diagnosis:  <principal problem not specified> Principal Problem: <principal problem not specified>       Patient Active Problem List    Diagnosis Date Noted  . Embolic stroke ([place]) 21/19/4174  . Right hemiparesis ([place])    . History of CVA with residual deficit    . Chronic anticoagulation    . Atrial fibrillation with rapid ventricular response ([place])    . Seizure prophylaxis    . Diastolic dysfunction    . Bacteremia due to Gram-positive bacteria    . Altered mental status    . Dementia without behavioral disturbance    . Leukocytosis    . Acute blood loss anemia    . Acute encephalopathy 06/04/2016  . PAF (paroxysmal atrial fibrillation) ([place])    . Iron deficiency anemia 04/04/2016  . History of CVA (cerebrovascular accident) 04/04/2016  . Cerebral hemorrhage (HCC) w/ SDH s/p IV tPA    . Coronary artery disease involving native coronary artery of native heart without angina pectoris    . History of right hip replacement    . Acute ischemic stroke ([place]) - L temporal lobe s/p tPA 03/30/2016  . BPH (benign prostatic hyperplasia) 11/25/2015  . GERD (gastroesophageal reflux disease) 11/25/2015  . CKD (chronic kidney disease), stage II 11/25/2015  . Overactive bladder    . Cerebrovascular accident (CVA) ([place]) 09/18/2015  . Gait disturbance, post-stroke  06/23/2015  . 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 Thalamic infarction ([place]) 06/21/2015  . Benign essential HTN    . Type 2 diabetes mellitus with complication, without long-term current use of insulin ([place])    . Dysphagia as late effect of cerebrovascular disease    . Hyponatremia    . HLD (hyperlipidemia)    . Obesity 03/09/2011  . COPD (chronic obstructive pulmonary disease) ([place]) 03/09/2011      Expected Discharge Date: Expected Discharge Date: 06/22/16   Team Members Present: Physician leading conference: Ilean Skill, PsyD;Dr. Alysia Penna Social Worker Present: Ovidio Kin, LCSW Nurse Present: Heather Roberts, RN PT Present: Barrie Folk, PT;Rosita Dechalus, PTA OT Present: Napoleon Form, OT SLP Present: Stormy Fabian, SLP PPS Coordinator present : Daiva Nakayama, RN, CRRN       Current Status/Progress Goal Weekly Team Focus  Medical     Orthostatic hypotension resolved, chronic Right knee pain, currently Mod A  maintain BP in desired range  manage orthostasis   Bowel/Bladder     Continent Bowel LBM 4/10, incontinent of urine at times  Be continent of B/B   Timed toilet during day, assist with urinal   Swallow/Nutrition/ Hydration     Supervision with dysphagia 1 with thin liquids  Supervision with least restrictive diet  skilled bservation of dysphagia 2 and 3 textures and use of compensatory  swallow strategies   ADL's     Mod A transfers; mod A LB bathing/dresing and toileting; Supervision UB bathing/dressing and grooming  Supervision overall; min A shower transfers  functional transfers, activity tolerance, activity tolerance, strengthening, d/c planning   Mobility     supervision w/c mobilty, modA gait 45ft with RW, modA transfers with RW  supervision transfers, mod I w/c mobilty, minA gait  OOB tolerance, transfers, balance, w/c mobilty    Communication               Safety/Cognition/ Behavioral Observations   min assist calls for assist-wife at bedisde   min  assist   Keep call bell and belongings within reach   Pain     Denies pain   Less than 3 out of 10  Asses and treat pain q shift/PRN   Skin     Stage II to coccyx-Foam  No new skin breakdown  Assess skin q shift      *See Care Plan and progress notes for long and short-term goals.   Barriers to Discharge: mod A current baseline Supervision     Possible Resolutions to Barriers:  cont rehab program     Discharge Planning/Teaching Needs:  Return home with wife assisting with his care. Hoping he does well here. Wife here daily and participates in therapies with pt.      Team Discussion:  Goals supervision-min assist level. BP more controlled today-MD adjusted medicines. Speech to trial regular diet today at lunch if does well will DC. Stage 2 on sacrum-foam dressing on-RN monitoring. Motivated to do therapies and make progress while here.  Revisions to Treatment Plan:  DC 4/20    Continued Need for Acute Rehabilitation Level of Care: The patient requires daily medical management by a physician with specialized training in physical medicine and rehabilitation for the following conditions: Daily direction of a multidisciplinary physical rehabilitation program to ensure safe treatment while eliciting the highest outcome that is of practical value to the patient.: Yes Daily medical management of patient stability for increased activity during participation in an intensive rehabilitation regime.: Yes Daily analysis of laboratory values and/or radiology reports with any subsequent need for medication adjustment of medical intervention for : Neurological problems   Deshan Hemmelgarn, [name] 06/13/2016, 3:22 PM      Elease Hashimoto, LCSW Social Worker Signed   Patient Care Conference Date of Service: 04/11/2016  1:25 PM      Hide copied text Hover for attribution information Inpatient RehabilitationTeam Conference and Plan of Care Update Date: 04/11/2016   Time: 10:55 AM      Patient Name: Herbert Marquez      Medical Record Number: [id number]  Date of Birth: 1927-09-01 Sex: Male         Room/Bed: 4W20C/4W20C-01 Payor Info: Payor: MEDICARE / Plan: MEDICARE PART A AND B / Product Type: *No Product type* /     Admitting Diagnosis: L CVA  Admit Date/Time:  04/04/2016  5:21 PM Admission Comments: No comment available    Primary Diagnosis:  Gait disturbance, post-stroke Principal Problem: Gait disturbance, post-stroke       Patient Active Problem List    Diagnosis Date Noted  . Iron deficiency anemia 04/04/2016  . Stroke (cerebrum) ([place]) 04/04/2016  . Prediabetes    . Chronic pain of right knee    . Cerebral hemorrhage (HCC) w/ SDH s/p IV tPA    . Coronary artery disease involving native coronary artery of native heart without  angina pectoris    . Paroxysmal atrial fibrillation with RVR ([place])    . Labile blood pressure    . Hypotension    . Controlled type 2 DM with peripheral circulatory disorder ([place])    . History of right hip replacement    . Acute blood loss anemia    . Acute ischemic stroke ([place]) - L temporal lobe s/p tPA 03/30/2016  . Persistent atrial fibrillation ([facility])    . Displaced intertrochanteric fracture of right femur, initial encounter for closed fracture ([place]) 11/26/2015  . Protein-calorie malnutrition, severe 11/26/2015  . Closed right hip fracture ([place]) 11/25/2015  . BPH (benign prostatic hyperplasia) 11/25/2015  . GERD (gastroesophageal reflux disease) 11/25/2015  . CKD (chronic kidney disease), stage II 11/25/2015  . Closed left hip fracture ([place]) 11/25/2015  . Citrobacter infection    . Controlled diabetes mellitus type 2 with complications ([place])    . Urinary frequency    . Sepsis ([place]) 11/05/2015  . Overactive bladder    . Sepsis, unspecified organism ([place]) 11/04/2015  . H/O: CVA (cerebrovascular accident) 11/04/2015  . Palpitations 09/18/2015  . Cerebrovascular accident (CVA) due to bilateral occlusion of posterior cerebral arteries ([place])  09/18/2015  . DM type 2 with diabetic peripheral neuropathy ([place])    . Gait disturbance, post-stroke 06/23/2015  . 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 Alterations of sensations following CVA (cerebrovascular accident) 06/23/2015  . Thalamic infarction ([place]) 06/21/2015  . Sinus tachycardia 06/20/2015  . Benign essential HTN    . Type 2 diabetes mellitus with complication, without long-term current use of insulin ([place])    . CAD in native artery    . Dysphagia as late effect of cerebrovascular disease    . Leukocytosis    . Hypokalemia    . Hyponatremia    . AKI (acute kidney injury) ([place])    . PSVT (paroxysmal supraventricular tachycardia) ([place])    . HLD (hyperlipidemia)    . Syncope 06/16/2015  . UTI (lower urinary tract infection) 08/31/2011  . Fracture of femoral neck, left ([place]) 03/09/2011  . Obesity 03/09/2011  . COPD (chronic obstructive pulmonary disease) ([place]) 03/09/2011      Expected Discharge Date: Expected Discharge Date: 04/14/16   Team Members Present: Physician leading conference: Dr. Alysia Penna Social Worker Present: Ovidio Kin, LCSW Nurse Present: Dorthula Nettles, RN PT Present: Barrie Folk, PT OT Present: Clyda Greener, OT SLP Present: Gunnar Fusi, SLP PPS Coordinator present : Daiva Nakayama, RN, CRRN       Current Status/Progress Goal Weekly Team Focus  Medical     Intake, improving, anemia, improving  Prevent secondary consultations of CVA as well as recurrent CVA  Continue monitoring of hemoglobin and basic metabolic package, medication adjustments   Bowel/Bladder      (Pt had BM 2/6.  )  Regular, formed bowel movements once a day.  Adequate activfity levels, hydration, fiber and monitoring of bowel patterns.   Swallow/Nutrition/ Hydration     Cardiac Diet w/1500 fluid restriction. and thin liquids.  No aspiration, good intake of nutrients and fluids.  Maintaining healthy weight and nutritional intake.   ADL's     Supervision for UB  selfcare in sitting with min assist for all LB selfcare sit to stand.  Min to min guard assist for transfers to the shower with use of the RW.  Decreased coordination in the RUE as well as increased ataxia.  Needs assist with tying shoes   supervision overall  selfcare retraining, balance  retraining, transfer training, neuromuscular reeducation, therapeutic exercise, DME education, pt/family education   Mobility     Min-supervision assist with transfers, bed mobility, and gait with RW for house hold distances.   supervision assist overall with LRAD   Improved endurance, balance, R neuromotor control, and safety with gait.    Communication     Mod A  Min A  utilizing word finding strategies, communicating wants and needs   Safety/Cognition/ Behavioral Observations   Mod A  Min A  Sustained attention, basic problem solving, orientation   Pain               Skin     Skin intact, bruising to side, slight swelling to right knee. No breakdown.  Skin to stay intact, no breakdown in perineal area or bony prominences.  frequent repositioning by pt, assisted as needed by staff, increasing activity and strength through therapy, good nutrition.     Rehab Goals Patient on target to meet rehab goals: Yes *See Care Plan and progress notes for long and short-term goals.   Barriers to Discharge: Wife is elderly. He cannot provide much physical assistance     Possible Resolutions to Barriers:  Continue neuromuscular reeducation.     Discharge Planning/Teaching Needs:  Wife would like to take pt home but needs to be able to manage him. She can do supervision level and not much if any physical care      Team Discussion:  Goals supervision and PT upgraded goals to supervision level. Cognitive issues close to baseline now. Once UTI treated and got some sleep, felt like a new man. Wife is here daily and observes in therapies. Medically stable for DC Sat.  Revisions to Treatment Plan:  Upgraded goals  supervision level. DC 2/10    Continued Need for Acute Rehabilitation Level of Care: The patient requires daily medical management by a physician with specialized training in physical medicine and rehabilitation for the following conditions: Daily direction of a multidisciplinary physical rehabilitation program to ensure safe treatment while eliciting the highest outcome that is of practical value to the patient.: Yes Daily medical management of patient stability for increased activity during participation in an intensive rehabilitation regime.: Yes Daily analysis of laboratory values and/or radiology reports with any subsequent need for medication adjustment of medical intervention for : Blood pressure problems;Neurological problems;Mood/behavior problems   Elease Hashimoto 04/12/2016, 8:38 AM      Elease Hashimoto, LCSW Social Worker Signed  Patient Care Conference 06/29/2015 12:11 PM    Expand All Collapse All   Inpatient RehabilitationTeam Conference and Plan of Care Update Date: 06/29/2015   Time: 11:00 AM     Patient Name: Herbert Marquez       Medical Record Number: [id number]  Date of Birth: 1927-03-06 Sex: Male         Room/Bed: 4W11C/4W11C-01 Payor Info: Payor: MEDICARE / Plan: MEDICARE PART A AND B / Product Type: *No Product type* /    Admitting Diagnosis: CVA  Admit Date/Time:  06/21/2015  6:01 PM Admission Comments: No comment available   Primary Diagnosis:  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  Gait disturbance, post-stroke  06/23/2015   .  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  Alterations of sensations following CVA (cerebrovascular accident)  06/23/2015   .  Thalamic infarction ([place])  06/21/2015   .  Sinus tachycardia ([place])  06/20/2015   .  Benign essential HTN     .  Type 2 diabetes mellitus with complication, without long-term current use of insulin ([place])       .  Coronary artery disease involving native coronary artery of native heart without angina pectoris     .  Dysphagia as late effect of cerebrovascular disease     .  Low grade fever     .  Leukocytosis     .  Hypokalemia     .  Hyponatremia     .  AKI (acute kidney injury) ([place])     .  PSVT (paroxysmal supraventricular tachycardia) ([place])     .  Cerebral thrombosis with cerebral infarction  06/17/2015   .  Acute CVA (cerebrovascular accident) ([place])  06/17/2015   .  Stroke (cerebrum) ([place])     .  HLD (hyperlipidemia)     .  Syncope  06/16/2015   .  Hyperlipidemia  04/22/2014   .  UTI (lower urinary tract infection)  08/31/2011   .  Acute renal failure ([place])  08/31/2011   .  Fracture of femoral neck, left ([place])  03/09/2011   .  DM (diabetes mellitus) ([place])  03/09/2011   .  Obesity  03/09/2011   .  COPD (chronic obstructive pulmonary disease) ([place])  03/09/2011   .  Essential hypertension  03/09/2011   .  CAD (coronary artery disease)  09/20/2010   .  Diabetes mellitus  09/20/2010   .  HTN (hypertension)  09/20/2010     Expected Discharge Date: Expected Discharge Date: 07/09/15  Team Members Present: Physician leading conference: Dr. Alysia Penna Social Worker Present: Ovidio Kin, LCSW Nurse Present: Rayetta Pigg, RN PT Present: Jorge Mandril, PT OT Present: [name], OT SLP Present: Windell Moulding, SLP PPS Coordinator present : Daiva Nakayama, RN, CRRN        Current Status/Progress  Goal  Weekly Team Focus   Medical     pocketing mild oral phase dysphagia, poor coordination R side, Right knee pain  maintain medical stability  improve Right knee pain   Bowel/Bladder     Incont episodes; UTI tx with keflex; LBM except smears 4/23  Cont x2 with min assist  Continue encouraging use of urinal and regular emptying    Swallow/Nutrition/ Hydration     Dys 3 textures, thin liquids   mod I   trials of regular textures    ADL's     Mod assist UB, total assist LB, Mod assist  sit > stand and squat pivot transfers, pt with decline in function on 4/24 and 4/25 had been supervision-min assist bathing and dressing with exception of mod assist for standing balance due to Rt knee weakness  Supervision overall  pt/family education, transfers, sit > stand, ADL retraining, RUE sensation/proprioception   Mobility     minA transfers and bed mobility, mod sit <>stand, maxA gait with L rail d/t R knee instability/impaired proprioception, poor endurance   mod I bed mobility, S transfers, S gait in home, stairs, w/c propulsion  transfers, R NMR, gait training, activity tolerance    Communication               Safety/Cognition/ Behavioral Observations    mild higher level cognitive deficits    mod I   semi-complex problem solving    Pain     No complaints of pain  < 2 on 0-10  Continue to monitor pt pain qshift and as needed    Skin     Bilat arm skin tears with tegaderm   No new skin breakdown/infection while on Rehab   Continue to monitor/treat      *See Care Plan and progress notes for long and short-term goals.    Barriers to Discharge:  had a set back with UTI     Possible Resolutions to Barriers:   cont rehab and med management     Discharge Planning/Teaching Needs:   HOme with wife she has some health issues of her own and can not provide much physical care. She is here daily observing in therapies.        Team Discussion:    Goals-supervision level. Being treated for UTI doing much better today. Poor endurance and needs therapies spread out. Upgraded diet to Dys 3 thin plans to upgrade to regular with next meal. R-knee brace ordered for stability   Revisions to Treatment Plan:    Upgraded diet to Dys 3 thin and then will regular    Continued Need for Acute Rehabilitation Level of Care: The patient requires daily medical management by a physician with specialized training in physical medicine and rehabilitation for the following conditions: Daily direction of a  multidisciplinary physical rehabilitation program to ensure safe treatment while eliciting the highest outcome that is of practical value to the patient.: Yes Daily medical management of patient stability for increased activity during participation in an intensive rehabilitation regime.: Yes Daily analysis of laboratory values and/or radiology reports with any subsequent need for medication adjustment of medical intervention for : Mood/behavior problems;Neurological problems  Elease Hashimoto 06/29/2015, 12:11 PM                  Patient ID: Tomi Bamberger, male   DOB: Jul 24, 1927, 83 y.o.   MRN: [id number]

## 2016-06-13 NOTE — IPOC Note (Signed)
Overall Plan of Care [facility]) Patient Details Name: Herbert Marquez MRN: [id number] DOB: 06/14/1927  Admitting Diagnosis: CVA  Hospital Problems: Active Problems:   Embolic stroke [facility])     Functional Problem List: Nursing Bladder, Endurance, Motor, Pain, Safety, Skin Integrity  PT Balance, Endurance, Motor, Pain, Skin Integrity  OT Balance, Pain, Motor, Endurance, Skin Integrity  SLP    TR         Basic ADL's: OT Grooming, Bathing, Dressing, Toileting     Advanced  ADL's: OT       Transfers: PT Car, Bed Mobility, Bed to Chair, Manufacturing systems engineer, Tub/Shower     Locomotion: PT Stairs, Emergency planning/management officer, Ambulation     Additional Impairments: OT    SLP Swallowing      TR      Anticipated Outcomes Item Anticipated Outcome  Self Feeding Mod I  Swallowing  Supervision   Basic self-care  supervision  Toileting  Supervision   Bathroom Transfers Supervision- min A  Bowel/Bladder  Continent to bowel and bladder with mod. assist.  Transfers  supervision  Locomotion  mod I w/c mobility; min assist short distance gait  Communication     Cognition     Pain  Less than 3,on 1 to 10 scale.  Safety/Judgment  Free from falls during his stay in rehab.   Therapy Plan: PT Intensity: Minimum of 1-2 x/day ,45 to 90 minutes PT Frequency: 5 out of 7 days PT Duration Estimated Length of Stay: 12-16 days OT Intensity: Minimum of 1-2 x/day, 45 to 90 minutes OT Frequency: 5 out of 7 days OT Duration/Estimated Length of Stay: 10-14 days SLP Intensity: Minumum of 1-2 x/day, 30 to 90 minutes SLP Frequency: 3 to 5 out of 7 days SLP Duration/Estimated Length of Stay: 7 days for SLP       Team Interventions: Nursing Interventions Patient/Family Education, Bladder Management, Disease Management/Prevention, Pain Management, Skin Care/Wound Management  PT interventions Balance/vestibular training, Ambulation/gait training, Cognitive remediation/compensation, Community  reintegration, Discharge planning, Disease management/prevention, DME/adaptive equipment instruction, Functional mobility training, Neuromuscular re-education, Pain management, Patient/family education, Psychosocial support, Skin care/wound management, Splinting/orthotics, Stair training, Therapeutic Activities, Therapeutic Exercise, UE/LE Strength taining/ROM, UE/LE Coordination activities, Wheelchair propulsion/positioning  OT Interventions Training and development officer, Academic librarian, Discharge planning, DME/adaptive equipment instruction, Functional mobility training, Neuromuscular re-education, Pain management, Patient/family education, Psychosocial support, Self Care/advanced ADL retraining, Therapeutic Activities, Therapeutic Exercise, UE/LE Coordination activities, UE/LE Strength taining/ROM, Wheelchair propulsion/positioning  SLP Interventions Dysphagia/aspiration precaution training, Patient/family education  TR Interventions    SW/CM Interventions Discharge Planning, Psychosocial Support, Patient/Family Education    Team Discharge Planning: Destination: PT-Home ,OT- Home , SLP-Home Projected Follow-up: PT-Home health PT, 24 hour supervision/assistance, OT-  Home health OT, SLP-24 hour supervision/assistance Projected Equipment Needs: PT-To be determined, OT-  , SLP-None recommended by SLP Equipment Details: PT-may require specialty w/c cushion due to Stage II sacral ulcer. Did not clarify with wife what kind of cushion he has at home in prior w/c. Pt's wife expressed wanting to see if getting a narrower w/c was possible. Will need to discuss with CSW/DME provider, OT-Pt has all needed DME Patient/family involved in discharge planning: PT- Family member/caregiver, Patient,  OT-Family member/caregiver, SLP-Patient, Family member/caregiver  MD ELOS: 7-10d Medical Rehab Prognosis:  Good Assessment:  83 y.o.malewith history of PAF, dementia, gait disorder due to OA right knee, CAD,  CVA 06/2009, recent left thalamic stroke with SDH --CIR stay and discharged to home at supervision level due to ataxia. History taken from  chart review and wife. He was making good progress at home and had completed HHPT. He was admitted on 06/04/16 via urology office with facial changes followed by mental status changes with agitation. MRI brain reviewed, but limited due to motion per report, secondary to severe agitation and negative for large acute infarct. EEG done revealing moderate diffuse slowing. UA negative. Blood cultures with Staph epidermidis thought to be contaminant, but started on IV Vanc due to fevers and encephalopathy.  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   Neurology recommended continuing Eliquis for secondary stroke prevention and stroke embolic due ot CVA or carotid stenosis Dr. Shon Hale felt that encephalopathy due to seizure as patient at high risk from his recent temporal lobe infarct and recommended Keppra for treatment. Antibiotics discontinued as staph epidermis + BC felt to be due to contaminant. Pt noted to be tachycardic and evaluated by Cardiology, who increased beta blocker and cleared for admission to inpatient rehab 4/7. EEG performed showing Grade 1 diastolic dysfunction   Now requiring 24/7 Rehab RN,MD, as well as CIR level PT, OT and SLP.  Treatment team will focus on ADLs and mobility with goals set at SUp See Team Conference Notes for weekly updates to the plan of care

## 2016-06-13 NOTE — Progress Notes (Signed)
Speech Language Pathology Daily Session Note  Patient Details  Name: Herbert Marquez MRN: [id number] Date of Birth: 23-Sep-1927  Today's Date: 06/13/2016   Treatment Session #1 SLP Individual Time: 0915-1000 SLP Individual Time Calculation (min): 45 min   Treatment Session #2 SLP Individual Time: 1130-1200 SLP Individual Time Calculation (min): 30 min  Short Term Goals: Week 1: SLP Short Term Goal 1 (Week 1): Pt will consume trials of dysphagia 2 with supervision cues for use of compensatory swallow strategies and no overt s/s of aspiration.  SLP Short Term Goal 2 (Week 1): Pt will consume trials of dysphagia 3 with supervision cues for use of compensatory swallow strategies and no overt s/s of aspiration. SLP Short Term Goal 3 (Week 1): Pt will consume trials of regular with supervision cues for use of compensatory swallow strategies and no overt s/s of aspiration.   Skilled Therapeutic Interventions:  Treatment Session #1 - focused on dysphagia goals. SLP facilitated session by helping pt order lunch items for trial of regular tray. SLP further facilitated session by providing skilled observation of pt consuming graham crackers witn Mod I use of compensatory swallow strategies. Pt able to consume with good attention to each bolus in a moderately distracting environement. Pt left in wheelchair with wife present and all needs within reach. Continue per current plan of care.   Treatment Session #2 - focused on dysphagia goals. SLP facilitated session by providing skilled observation of pt consuming regular lunch tray with thin liquids. Pt consumed with moderate distractions present without overt s/s of aspiration. Pt is appropriate to continue regular diet with full nursing supervision. Wife and daughter signed off to supervise. Pt left upright in wheelchair, daughter present and all needs within reach. Continue per current plan of care.      Function:  Eating Eating   Modified  Consistency Diet: No Eating Assist Level: Swallowing techniques: self managed           Cognition Comprehension Comprehension assist level: Follows basic conversation/direction with no assist  Expression   Expression assist level: Expresses basic needs/ideas: With no assist  Social Interaction Social Interaction assist level: Interacts appropriately 90% of the time - Needs monitoring or encouragement for participation or interaction.  Problem Solving Problem solving assist level: Solves basic problems with no assist  Memory Memory assist level: Recognizes or recalls 75 - 89% of the time/requires cueing 10 - 24% of the time    Pain    Therapy/Group: Individual Therapy   Joby Hershkowitz B. Rutherford Nail, M.S., CCC-SLP Speech-Language Pathologist   Nissa Stannard 06/13/2016, 10:45 AM

## 2016-06-14 ENCOUNTER — Inpatient Hospital Stay (HOSPITAL_COMMUNITY): Payer: Medicare Other | Admitting: Speech Pathology

## 2016-06-14 ENCOUNTER — Ambulatory Visit (HOSPITAL_COMMUNITY): Payer: Medicare Other | Admitting: Physical Therapy

## 2016-06-14 ENCOUNTER — Inpatient Hospital Stay (HOSPITAL_COMMUNITY): Payer: Medicare Other | Admitting: Occupational Therapy

## 2016-06-14 ENCOUNTER — Inpatient Hospital Stay (HOSPITAL_COMMUNITY): Payer: Medicare Other | Admitting: Physical Therapy

## 2016-06-14 NOTE — Progress Notes (Signed)
Speech Language Pathology Daily Session Note  Patient Details  Name: CADEN FUKUSHIMA MRN: [id number] Date of Birth: June 23, 1927  Today's Date: 06/14/2016   Treatment Session #1 SLP Individual Time: 7530-0511 SLP Individual Time Calculation (min): 30 min   Treatment Session #2 SLP Individual Time: 0211-1735  SLP Individual Time Calculation (min): 30 min  Short Term Goals: Week 1: SLP Short Term Goal 1 (Week 1): Pt will consume trials of dysphagia 2 with supervision cues for use of compensatory swallow strategies and no overt s/s of aspiration.  SLP Short Term Goal 2 (Week 1): Pt will consume trials of dysphagia 3 with supervision cues for use of compensatory swallow strategies and no overt s/s of aspiration. SLP Short Term Goal 3 (Week 1): Pt will consume trials of regular with supervision cues for use of compensatory swallow strategies and no overt s/s of aspiration.   Skilled Therapeutic Interventions:    Skilled treatment session #1 - focused on dysphagia goals. SLP facilitated session by providing skilled observation of pt consuming regular breakfast tray with thin liquids. Pt didn't demonstrate any overt s/s of aspiration. Pt no longer requires full supervision per perform on recent trials. Pt left upright in wheelchair, nursing present and all needs within reach.   SKilled treatment session #2 - focused on dysphagia goals. SLP facilitated session by providing skilled observation of of pt consuming regular lunch tray with thin liquids. Pt very talkative while food in mouth with intermittent wet voice during these times. Education provided to wait until he swallowed then talk. Pt able to implement strategy independently and no wet voice observed for remainder of meal. Pt left upright in wheelchair, wife present and all needs within reach. Order changed to intermittent supervision. Grad for dysphagia therapy is planned for 06/15/16. Pt and wife in agreement.   Function:  Eating Eating    Modified Consistency Diet: No Eating Assist Level: Set up assist for   Eating Set Up Assist For: Opening containers;Cutting food       Cognition Comprehension Comprehension assist level: Follows basic conversation/direction with no assist  Expression   Expression assist level: Expresses basic needs/ideas: With no assist  Social Interaction Social Interaction assist level: Interacts appropriately 90% of the time - Needs monitoring or encouragement for participation or interaction.  Problem Solving Problem solving assist level: Solves basic problems with no assist  Memory Memory assist level: Recognizes or recalls 75 - 89% of the time/requires cueing 10 - 24% of the time    Pain    Therapy/Group: Individual Therapy   Rhian Asebedo B. Rutherford Nail, M.S., CCC-SLP Speech-Language Pathologist   Tamico Mundo 06/14/2016, 12:13 PM

## 2016-06-14 NOTE — Progress Notes (Signed)
Occupational Therapy Session Note  Patient Details  Name: Herbert Marquez MRN: [id number] Date of Birth: 02-Aug-1927  Today's Date: 06/14/2016 OT Individual Time: 1478-2956 OT Individual Time Calculation (min): 90 min    Short Term Goals: Week 1:  OT Short Term Goal 1 (Week 1): Pt will complete squat pivot transfer to toilet with min A OT Short Term Goal 2 (Week 1): Pt will dress LB with steadying assist OT Short Term Goal 3 (Week 1): Pt will complete 1 grooming task in standing with min A for functional activity tolerance OT Short Term Goal 4 (Week 1): Pt will complete toileting task with mod A  Skilled Therapeutic Interventions/Progress Updates:    Pt seen for OT ADL bathing/dressing session. Pt sitting up in w/c upon arrival, agreeable to tx session and denied pain. Throughout session, pt completed squat pivot transfer with min A and VCs for awareness to placement of LEs.  He bathed seated on 3-1 BSC, stood with use of grab bars and steadying assist to complete buttock hygiene.  He dressed seated in w/c, standing at RW to pull pants up with min A and VCs for hand placement on RW. He completed grooming tasks mod I seated at sink.  Completed simulated shower stall transfer in hallway utilizing hallway railing in simulation of home grab bar. He completed with min A and VCs for safety.  In therapy gym, pt completed UE strengthening exercises using level I theraband. Exercises including: chest expansion, diagonal up, upright row, and bicep curl. Completed x1 set of 10 reps with multimodal cues for proper form and technique. He self propelled back to w/c at end of session, left seated in w/c with all needs in reach.   Therapy Documentation Precautions:  Precautions Precautions: Fall, Other (comment) Precaution Comments: seizure; orthostatic - watch BPs Required Braces or Orthoses: Other Brace/Splint Other Brace/Splint: R knee brace Restrictions Weight Bearing Restrictions: No Pain:  No/ denies pain  See Function Navigator for Current Functional Status.   Therapy/Group: Individual Therapy  Lewis, Brit Wernette C 06/14/2016, 7:27 AM

## 2016-06-14 NOTE — Progress Notes (Signed)
Subjective/Complaints:  Tired from therapy, but otherwise no complaints. Good appetite today. No dizziness reported  ROS- neg CP. SOB. N/V/D  Objective: Vital Signs: Blood pressure (!) 111/56, pulse 62, temperature 98.4 F (36.9 C), temperature source Oral, resp. rate 18, height 5' 7" (1.702 m), weight 76 kg (167 lb 9.6 oz), SpO2 96 %. No results found. Results for orders placed or performed during the hospital encounter of 06/10/16 (from the past 72 hour(s))  Basic metabolic panel     Status: Abnormal   Collection Time: 06/11/16  3:11 PM  Result Value Ref Range   Sodium 127 (L) 135 - 145 mmol/L   Potassium 4.3 3.5 - 5.1 mmol/L    Comment: SLIGHT HEMOLYSIS   Chloride 96 (L) 101 - 111 mmol/L   CO2 18 (L) 22 - 32 mmol/L   Glucose, Bld 111 (H) 65 - 99 mg/dL   BUN 17 6 - 20 mg/dL   Creatinine, Ser 0.95 0.61 - 1.24 mg/dL   Calcium 8.3 (L) 8.9 - 10.3 mg/dL   GFR calc non Af Amer >60 >60 mL/min   GFR calc Af Amer >60 >60 mL/min    Comment: (NOTE) The eGFR has been calculated using the CKD EPI equation. This calculation has not been validated in all clinical situations. eGFR's persistently <60 mL/min signify possible Chronic Kidney Disease.    Anion gap 13 5 - 15  CBC     Status: Abnormal   Collection Time: 06/11/16  3:11 PM  Result Value Ref Range   WBC 7.7 4.0 - 10.5 K/uL   RBC 3.79 (L) 4.22 - 5.81 MIL/uL   Hemoglobin 11.8 (L) 13.0 - 17.0 g/dL   HCT 34.1 (L) 39.0 - 52.0 %   MCV 90.0 78.0 - 100.0 fL   MCH 31.1 26.0 - 34.0 pg   MCHC 34.6 30.0 - 36.0 g/dL   RDW 15.3 11.5 - 15.5 %   Platelets 358 150 - 400 K/uL  Basic metabolic panel     Status: Abnormal   Collection Time: 06/12/16  5:49 AM  Result Value Ref Range   Sodium 133 (L) 135 - 145 mmol/L   Potassium 3.9 3.5 - 5.1 mmol/L   Chloride 101 101 - 111 mmol/L   CO2 24 22 - 32 mmol/L   Glucose, Bld 99 65 - 99 mg/dL   BUN 12 6 - 20 mg/dL   Creatinine, Ser 0.90 0.61 - 1.24 mg/dL   Calcium 8.9 8.9 - 10.3 mg/dL   GFR calc  non Af Amer >60 >60 mL/min   GFR calc Af Amer >60 >60 mL/min    Comment: (NOTE) The eGFR has been calculated using the CKD EPI equation. This calculation has not been validated in all clinical situations. eGFR's persistently <60 mL/min signify possible Chronic Kidney Disease.    Anion gap 8 5 - 15     HEENT: normal Cardio: irregularly Irregular Resp: CTA B/L and unlabored GI: +BS, NT,ND Extremity:  No Edema, No calf tenderness Skin:  No skin breakdown Neuro: reduced sensation RUE and RLE to LT Musc/Skel:  Other Right knee pain and decreased ROM Gen NAD   Assessment/Plan: 1. 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 Physiatrist is providing close team supervision and 24 hour management of active medical problems listed below. Physiatrist and rehab team continue to assess barriers to discharge/monitor patient progress toward functional and medical goals. FIM:  Function - Bathing Position: Shower Body parts bathed by patient: Right arm, Buttocks, Left lower leg, Back, Right upper leg, Left arm, Chest, Abdomen, Left upper leg, Right lower leg, Front perineal area Assist Level: Touching or steadying assistance(Pt > 75%)  Function- Upper Body Dressing/Undressing What is the patient wearing?: Pull over shirt/dress Pull over shirt/dress - Perfomed by patient: Thread/unthread right sleeve, Thread/unthread left sleeve, Put head through opening, Pull shirt over trunk Assist Level: Supervision or verbal cues, Set up Set up : To obtain clothing/put away Function - Lower Body Dressing/Undressing What is the patient wearing?: Socks, Shoes, [employer], Pants, Underwear Position: Education officer, museum at [employer] - Performed by patient: Thread/unthread right underwear leg, Thread/unthread left underwear leg Underwear - Performed by helper: Pull underwear  up/down Pants- Performed by patient: Thread/unthread right pants leg, Thread/unthread left pants leg Pants- Performed by helper: Pull pants up/down Socks - Performed by patient: Don/doff right sock, Don/doff left sock Shoes - Performed by patient: Don/doff right shoe, Don/doff left shoe Shoes - Performed by helper: Fasten right, Fasten left TED Hose - Performed by helper: Don/doff right TED hose, Don/doff left TED hose Assist for footwear: Supervision/touching assist Assist for lower body dressing: Touching or steadying assistance (Pt > 75%)  Function - Toileting Toileting steps completed by patient: Adjust clothing prior to toileting, Performs perineal hygiene Toileting steps completed by helper: Adjust clothing prior to toileting Toileting Assistive Devices: Grab bar or rail Assist level: Touching or steadying assistance (Pt.75%)  Function - Air cabin crew transfer assistive device: Elevated toilet seat/BSC over toilet, Grab bar Assist level to toilet: Moderate assist (Pt 50 - 74%/lift or lower) Assist level from toilet: Moderate assist (Pt 50 - 74%/lift or lower)  Function - Chair/bed transfer Chair/bed transfer method: Squat pivot Chair/bed transfer assist level: Touching or steadying assistance (Pt > 75%) Chair/bed transfer assistive device: Armrests, Walker  Function - Locomotion: Wheelchair Will patient use wheelchair at discharge?: Yes Type: Manual Max wheelchair distance: 175f  Assist Level: Supervision or verbal cues Assist Level: Supervision or verbal cues Assist Level: Supervision or verbal cues Turns around,maneuvers to table,bed, and toilet,negotiates 3% grade,maneuvers on rugs and over doorsills: No Function - Locomotion: Ambulation Ambulation activity did not occur: Safety/medical concerns (orthostatic with minimal standing) Assistive device: Walker-rolling Max distance: 159f Assist level: Touching or steadying assistance (Pt > 75%) Walk 10 feet  activity did not occur: Safety/medical concerns Assist level: Touching or steadying assistance (Pt > 75%) Walk 50 feet with 2 turns activity did not occur: Safety/medical concerns Walk 150 feet activity did not occur: Safety/medical concerns Walk 10 feet on uneven surfaces activity did not occur: Safety/medical concerns  Function - Comprehension Comprehension: Auditory Comprehension assistive device: Hearing aids Comprehension assist level: Follows basic conversation/direction with no assist  Function - Expression Expression: Verbal Expression assist level: Expresses basic needs/ideas: With no assist  Function - Social Interaction Social Interaction assist level: Interacts appropriately 90% of the time - Needs monitoring or encouragement for participation or interaction.  Function - Problem Solving Problem solving assist level: Solves basic problems with no assist  Function - Memory Memory assist level: Recognizes or recalls 75 - 89% of the time/requires cueing 10 - 24% of the time Patient normally able to recall (first 3 days only): That he or she is in a hospital, Staff names and faces, Current season   Medical Problem List and Plan: 1.  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  DVT Prophylaxis/Anticoagulation: Pharmaceutical: Other (comment)--Eliquis, no signs of DVT 3. Pain Management: Continue Voltaren gel for knee pain.  4. Mood: Wife supportive and is staying to provide psychological support.  5. Neuropsych: This patient is not  capable of making decisions on his own behalf. 6. Skin/Wound Care: Routine pressure relief measures.  7. Fluids/Electrolytes/Nutrition: Monitor I/O. Offer supplements between meals  8. A fib with RVR: Monitor HR bid--continue Eliquis, metoprolol. 9.  Leucocytosis: Continue to monitor for signs of  infection.  10. GERD: Managed by Protonix.  11. Hyponatremia: Progressive drop in Na--may need fluid restriction. Follow up in am. 12. Seizure prophylaxis: started on Keppra--monitor for tolerance.  13. Diastolic dysfunction: Monitor signs/symptoms of fluid overload. 14. Hyponatremia: improved from 130->133 15. ABLA: Hgb improved from 10.8->11.6->11.8gm 16.  Orthostatic hypotension likely flomax plus reduced fluid intake,off flomax still voiding, may be developing some increased frequency of voiding, monitor LOS (Days) 4 A FACE TO FACE EVALUATION WAS PERFORMED  Herbert Marquez E 06/14/2016, 7:57 AM

## 2016-06-15 ENCOUNTER — Ambulatory Visit (HOSPITAL_COMMUNITY): Payer: Medicare Other | Admitting: Physical Therapy

## 2016-06-15 ENCOUNTER — Inpatient Hospital Stay (HOSPITAL_COMMUNITY): Payer: Medicare Other | Admitting: Physical Therapy

## 2016-06-15 ENCOUNTER — Inpatient Hospital Stay (HOSPITAL_COMMUNITY): Payer: Medicare Other | Admitting: Speech Pathology

## 2016-06-15 ENCOUNTER — Inpatient Hospital Stay (HOSPITAL_COMMUNITY): Payer: Medicare Other | Admitting: Occupational Therapy

## 2016-06-15 DIAGNOSIS — I6310 Cerebral infarction due to embolism of unspecified precerebral artery: Secondary | ICD-10-CM

## 2016-06-15 LAB — BASIC METABOLIC PANEL
Anion gap: 8 (ref 5–15)
BUN: 14 mg/dL (ref 6–20)
CO2: 23 mmol/L (ref 22–32)
Calcium: 9 mg/dL (ref 8.9–10.3)
Chloride: 102 mmol/L (ref 101–111)
Creatinine, Ser: 0.94 mg/dL (ref 0.61–1.24)
GFR calc Af Amer: >60 mL/min (ref >60)
GFR calc non Af Amer: >60 mL/min (ref >60)
Glucose, Bld: 92 mg/dL (ref 65–99)
Potassium: 4 mmol/L (ref 3.5–5.1)
Sodium: 133 mmol/L — ABNORMAL LOW (ref 135–145)

## 2016-06-15 NOTE — Progress Notes (Signed)
Physical Therapy Session Note  Patient Details  Name: Herbert Marquez MRN: [id number] Date of Birth: [birth year]  Today's Date: 06/15/2016 PT Individual Time: 1415-1500 PT Individual Time Calculation (min): 45 min   Short Term Goals: Week 1:  PT Short Term Goal 1 (Week 1): Pt will be able to perform basic transfers with min assist PT Short Term Goal 2 (Week 1): Pt will be able to tolerate standing without changes in vital signs x 3 min during functional activity PT Short Term Goal 3 (Week 1): Pt will be able to gait x 25' with min assist Week 2:     Skilled Therapeutic Interventions/Progress Updates:   Session 1: Pt received sitting in WC and agreeable to PT. Pt transported to rehab gym in [facility].   PT instructed pt in car transfer with min assist to manage the RLE into car due to kne ROM restrictions.   Gait training x 62f with RW and supervision assist from PT. Min cues from PT for improved step length on the RLE.  Transfer training including sit<>stand and stand pivot with Supervision assist from PT and RW. Min cues for proper UE placement to for improved anterior weight shifting.   WC mobility training for simulate home environment to weave through 8 cones with supervision assist from PT. Additional WC mobility training in hall of hospital x 1536fwith supervision assist.   [facility] and all needs met.     Session 2.   Pt received sitting in WC and agreeable to PT. WC mobility x [id number] hall of hospital with supervision assist and min cues for proper use of UE to ascend /descent incline from one unit of hospital to another.   Gait training on uneven cement sidewalk x [id number] RW and min assist from PT and min assist for safety and improved weight shifting to allow improved step height on the RLE.   Patient returned too room and [facility] reach and all needs met.           Therapy Documentation Precautions:  Precautions Precautions: Fall, Other (comment) Precaution Comments: seizure; orthostatic - watch BPs Required Braces or Orthoses: Other Brace/Splint Other Brace/Splint: R knee brace Restrictions Weight Bearing Restrictions: No Vital Signs: Therapy Vitals Temp: 97.8 F (36.6 C) Temp Source: Oral Pulse Rate: 78 Resp: 18 BP: 108/67 Patient Position (if appropriate): Sitting Oxygen Therapy SpO2: 98 % O2 Device: Not Delivered Pain: 0/10    See Function Navigator for Current Functional Status.   Therapy/Group: Individual Therapy  AustiLorie Phenix/2018, 3:03 PM

## 2016-06-15 NOTE — Progress Notes (Signed)
Occupational Therapy Session Note  Patient Details  Name: Herbert Marquez MRN: [id number] Date of Birth: [birth year]  Today's Date: 06/15/2016 OT Individual Time: 1030-1200 OT Individual Time Calculation (min): 90 min    Short Term Goals: Week 1:  OT Short Term Goal 1 (Week 1): Pt will complete squat pivot transfer to toilet with min A OT Short Term Goal 2 (Week 1): Pt will dress LB with steadying assist OT Short Term Goal 3 (Week 1): Pt will complete 1 grooming task in standing with min A for functional activity tolerance OT Short Term Goal 4 (Week 1): Pt will complete toileting task with mod A  Skilled Therapeutic Interventions/Progress Updates:    Pt seen for OT session focsuing on functional transfers, functional standing balance/ endurance, and genreal activity tolerance. Pt in supine upon arrival, agreeable to tx session. He voiced having slept well and denied pain. He transferred to EOB, donned knee brace with assist and donned pants with min VCs and hand held assist for balance while pulling pants up. Pt completed squat pivots throughout session with close supervision. He self propelled w/c to therapy day room for UE strengthening. Completed stand/squat pivot transfers x2 w/c <> low soft surface couch with supervision and VCs for hand placement. He required rest breaks following each transfer. In therapy gym, pt completed standing tasks from EOM addressing standing balance and facilitating weightbearing through R LE. Pt able to complete dynamic standing tasks with supervision and unilateral support on RW while therapist supported R knee. Pt tolerated ~1 minute each standing trial before requiring seated rest break. He ambulated 29 ft to therapy mat with min steadying assist. Following rest break, he transferred onto NuStep and completed 7 minutes on level 3 for UE/LE strengthening and cardio endurance. Pt returned to room at end of session, left seated in w/c with all needs in reach.    Therapy Documentation Precautions:  Precautions Precautions: Fall, Other (comment) Precaution Comments: seizure; orthostatic - watch BPs Required Braces or Orthoses: Other Brace/Splint Other Brace/Splint: R knee brace Restrictions Weight Bearing Restrictions: No Pain:   No/ denies pain  See Function Navigator for Current Functional Status.   Therapy/Group: Individual Therapy  Lewis, [name] 06/15/2016, 7:08 AM

## 2016-06-15 NOTE — Progress Notes (Signed)
Speech Language Pathology Discharge Summary  Patient Details  Name: Herbert Marquez MRN: [id number] Date of Birth: 1927-05-18  Today's Date: 06/15/2016 SLP Individual Time: 0830-0930 SLP Individual Time Calculation (min): 60 min   Skilled Therapeutic Interventions:  Skilled treatment session focused on dysphagia goals. SLP facilitated session by providing Mod I for use of compensatory swallow strategies. Pt remembered not to talk while eating and he also implemented lingual sweep for mild accumulating left lateral sulci residue. Pt is tolerating regular diet with thin liquids and skilled ST is no longer indicated at this time.      Patient has met 1 of 1 long term goals.  Patient to discharge at overall Modified Independent level.   Clinical Impression/Discharge Summary:   Pt has made great progress in ST sessions and as a result he has met his LTGs. Pt is appropriate for discharge from [facility] at this time. Pt and wife are agreeable and all education has been completed.   Care Partner:  Caregiver Able to Provide Assistance: Yes     Recommendation:  None      Equipment:   N/A  Reasons for discharge: Treatment goals met   Patient/Family Agrees with Progress Made and Goals Achieved: Yes   Function:  Eating Eating   Modified Consistency Diet: No Eating Assist Level: Set up assist for   Eating Set Up Assist For: Opening containers;Cutting food       Cognition Comprehension Comprehension assist level: Follows basic conversation/direction with no assist  Expression   Expression assist level: Expresses basic needs/ideas: With no assist  Social Interaction Social Interaction assist level: Interacts appropriately 90% of the time - Needs monitoring or encouragement for participation or interaction.  Problem Solving Problem solving assist level: Solves basic problems with no assist  Memory Memory assist level: Recognizes or recalls 75 - 89% of the time/requires cueing 10 -  24% of the time   Oliver Neuwirth B. Rutherford Nail, M.S., CCC-SLP Speech-Language Pathologist  [name] 06/15/2016, 8:48 AM

## 2016-06-15 NOTE — Progress Notes (Signed)
Physical Therapy Session Note  Patient Details  Name: Herbert Marquez MRN: [id number] Date of Birth: Sep 09, 1927  Today's Date: 06/14/2016 PT Individual Time: 1605-1700 PT Individual Time Calculation: 72mn     Short Term Goals: Week 1:  PT Short Term Goal 1 (Week 1): Pt will be able to perform basic transfers with min assist PT Short Term Goal 2 (Week 1): Pt will be able to tolerate standing without changes in vital signs x 3 min during functional activity PT Short Term Goal 3 (Week 1): Pt will be able to gait x 25' with min assist  Skilled Therapeutic Interventions/Progress Updates:   Pt received sitting in WC and agreeable to PT. PT treatment focusing on functional mobility inclduig gait and WC negotiation in various settings.   Pt instructed in gait training for 3 bouts of 246f 2026fnd 8f110fth min assist and BUE support on RW. Min cues from PT for improved step length on the RLE and improved terminal knee extensoin.   WC mobility in hall of hospital x [id number] 33f75ft [id number] assist form PT. Additional WC mobility on unlevel cement sidewalk x 150ft [id number] assist from PT to prevent Loss of control on downhill grade to prevent pt form hitting bricked fountain.   Patient returned too room and left [facility] and all needs met and RN present.         Therapy Documentation Precautions:  Precautions Precautions: Fall, Other (comment) Precaution Comments: seizure; orthostatic - watch BPs Required Braces or Orthoses: Other Brace/Splint Other Brace/Splint: R knee brace Restrictions Weight Bearing Restrictions: No    Vital Signs: Therapy Vitals Temp: 98 F (36.7 C) Temp Source: Oral Pulse Rate: 61 Resp: 18 BP: 105/72 Patient Position (if appropriate): Lying Oxygen Therapy SpO2: 100 % O2 Device: Not Delivered   See Function Navigator for Current Functional Status.   Therapy/Group: Individual Therapy  Ociel Retherford ELorie Phenix18, 6:02 AM

## 2016-06-15 NOTE — Progress Notes (Signed)
Subjective/Complaints:  Moving bowels in am, good day with PT, ambulated outdoors , then 40' indoors  ROS- neg CP. SOB. N/V/D  Objective: Vital Signs: Blood pressure 105/72, pulse 61, temperature 98 F (36.7 C), temperature source Oral, resp. rate 18, height _0  (1.702 m), weight 76.2 kg (167 lb 15.9 oz), SpO2 100 %. No results found. Results for orders placed or performed during the hospital encounter of 06/10/16 (from the past 72 hour(s))  Basic metabolic panel     Status: Abnormal   Collection Time: 06/15/16  5:37 AM  Result Value Ref Range   Sodium 133 (L) 135 - 145 mmol/L   Potassium 4.0 3.5 - 5.1 mmol/L   Chloride 102 101 - 111 mmol/L   CO2 23 22 - 32 mmol/L   Glucose, Bld 92 65 - 99 mg/dL   BUN 14 6 - 20 mg/dL   Creatinine, Ser 0.94 0.61 - 1.24 mg/dL   Calcium 9.0 8.9 - 10.3 mg/dL   GFR calc non Af Amer >60 >60 mL/min   GFR calc Af Amer >60 >60 mL/min    Comment: (NOTE) The eGFR has been calculated using the CKD EPI equation. This calculation has not been validated in all clinical situations. eGFR's persistently <60 mL/min signify possible Chronic Kidney Disease.    Anion gap 8 5 - 15     HEENT: normal Cardio: irregularly Irregular Resp: CTA B/L and unlabored GI: +BS, NT,ND Extremity:  No Edema, No calf tenderness Skin:  No skin breakdown Neuro: reduced sensation RUE and RLE to LT Musc/Skel:  Other Right knee pain and decreased ROM Gen NAD   Assessment/Plan: 1. 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 Physiatrist is providing close team supervision and 24 hour management of active medical problems listed below. Physiatrist and rehab team continue to assess barriers to discharge/monitor patient progress toward functional and medical goals. FIM: Function - Bathing Position: Shower Body parts bathed by patient: Right  arm, Buttocks, Left lower leg, Back, Right upper leg, Left arm, Chest, Abdomen, Left upper leg, Right lower leg, Front perineal area Assist Level: Touching or steadying assistance(Pt > 75%)  Function- Upper Body Dressing/Undressing What is the patient wearing?: Pull over shirt/dress Pull over shirt/dress - Perfomed by patient: Thread/unthread right sleeve, Thread/unthread left sleeve, Put head through opening, Pull shirt over trunk Assist Level: Supervision or verbal cues, Set up Set up : To obtain clothing/put away Function - Lower Body Dressing/Undressing What is the patient wearing?: Pants, Underwear, Non-skid slipper socks Position: Wheelchair/chair at sink Underwear - Performed by patient: Thread/unthread right underwear leg, Thread/unthread left underwear leg, Pull underwear up/down Underwear - Performed by helper: Pull underwear up/down Pants- Performed by patient: Thread/unthread right pants leg, Thread/unthread left pants leg, Pull pants up/down Pants- Performed by helper: Pull pants up/down Non-skid slipper socks- Performed by patient: Don/doff right sock, Don/doff left sock Socks - Performed by patient: Don/doff right sock, Don/doff left sock Shoes - Performed by patient: Don/doff right shoe, Don/doff left shoe Shoes - Performed by helper: Fasten right, Fasten left TED Hose - Performed by helper: Don/doff right TED hose, Don/doff left TED hose Assist for footwear: Independent Assist for lower body dressing: Touching or steadying assistance (Pt > 75%)  Function - Toileting Toileting steps completed by patient: Adjust clothing prior to toileting, Performs perineal hygiene Toileting steps completed by helper: Adjust clothing after toileting Toileting Assistive Devices: Grab  bar or rail Assist level: Touching or steadying assistance (Pt.75%)  Function - Toilet Transfers Toilet transfer assistive device: Elevated toilet seat/BSC over toilet, Grab bar Assist level to toilet:  Moderate assist (Pt 50 - 74%/lift or lower) Assist level from toilet: Moderate assist (Pt 50 - 74%/lift or lower)  Function - Chair/bed transfer Chair/bed transfer method: Squat pivot Chair/bed transfer assist level: Touching or steadying assistance (Pt > 75%) Chair/bed transfer assistive device: Armrests, Walker  Function - Locomotion: Wheelchair Will patient use wheelchair at discharge?: Yes Type: Manual Max wheelchair distance: 122f  Assist Level: Supervision or verbal cues Assist Level: Supervision or verbal cues Assist Level: Supervision or verbal cues Turns around,maneuvers to table,bed, and toilet,negotiates 3% grade,maneuvers on rugs and over doorsills: No Function - Locomotion: Ambulation Ambulation activity did not occur: Safety/medical concerns (orthostatic with minimal standing) Assistive device: Walker-rolling Max distance: 156f Assist level: Touching or steadying assistance (Pt > 75%) Walk 10 feet activity did not occur: Safety/medical concerns Assist level: Touching or steadying assistance (Pt > 75%) Walk 50 feet with 2 turns activity did not occur: Safety/medical concerns Walk 150 feet activity did not occur: Safety/medical concerns Walk 10 feet on uneven surfaces activity did not occur: Safety/medical concerns  Function - Comprehension Comprehension: Auditory Comprehension assistive device: Hearing aids Comprehension assist level: Follows basic conversation/direction with no assist  Function - Expression Expression: Verbal Expression assist level: Expresses basic needs/ideas: With no assist  Function - Social Interaction Social Interaction assist level: Interacts appropriately 90% of the time - Needs monitoring or encouragement for participation or interaction.  Function - Problem Solving Problem solving assist level: Solves basic problems with no assist  Function - Memory Memory assist level: Recognizes or recalls 75 - 89% of the time/requires cueing 10  - 24% of the time Patient normally able to recall (first 3 days only): Current season, Location of own room, Staff names and faces, That he or she is in a hospital   Medical Problem List and Plan: 1.  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  DVT Prophylaxis/Anticoagulation: Pharmaceutical: Other (comment)--Eliquis, no signs of DVT 3. Pain Management: Continue Voltaren gel for knee pain.  4. Mood: Wife supportive and is staying to provide psychological support.  5. Neuropsych: This patient is not  capable of making decisions on his own behalf. 6. Skin/Wound Care: Routine pressure relief measures.  7. Fluids/Electrolytes/Nutrition: Monitor I/O. Offer supplements between meals  8. A fib with RVR: Monitor HR bid--continue Eliquis, metoprolol.rate is controlled  9.  Leucocytosis: Continue to monitor for signs of infection.  10. GERD: Managed by Protonix.   12. Seizure prophylaxis: started on Keppra--monitor for tolerance.  13. Diastolic dysfunction: Monitor signs/symptoms of fluid overload. 14. Hyponatremia: improved from 130->133 15. ABLA: Hgb improved from 10.8->11.6->11.8gm 16.  Orthostatic hypotension resolved off flomax , BP still on low side but no dizziness    LOS (Days) 5 A FACE TO FACE EVALUATION WAS PERFORMED  Rozelle Caudle E 06/15/2016, 7:41 AM

## 2016-06-16 ENCOUNTER — Inpatient Hospital Stay (HOSPITAL_COMMUNITY): Payer: Medicare Other | Admitting: Occupational Therapy

## 2016-06-16 ENCOUNTER — Inpatient Hospital Stay (HOSPITAL_COMMUNITY): Payer: Medicare Other

## 2016-06-16 DIAGNOSIS — I6349 Cerebral infarction due to embolism of other cerebral artery: Secondary | ICD-10-CM

## 2016-06-16 DIAGNOSIS — N3942 Incontinence without sensory awareness: Secondary | ICD-10-CM

## 2016-06-16 NOTE — Progress Notes (Signed)
Pt sitting on side of bed awaiting therapy session, reported feeling dizzy. Vs checked and doc. Abd binder applied and assisted to wheelchair. No nausea noted, no chest pain, wife reported vitals when standing are what pt normally runs, rather than elevated BP noted prior to standing. Margarito Liner

## 2016-06-16 NOTE — Progress Notes (Signed)
Occupational Therapy Session Note  Patient Details  Name: Herbert Marquez MRN: [id number] Date of Birth: 1927-05-30  Today's Date: 06/16/2016 OT Individual Time: 1155-1240 OT Individual Time Calculation (min): 45 min    Short Term Goals: Week 1:  OT Short Term Goal 1 (Week 1): Pt will complete squat pivot transfer to toilet with min A OT Short Term Goal 2 (Week 1): Pt will dress LB with steadying assist OT Short Term Goal 3 (Week 1): Pt will complete 1 grooming task in standing with min A for functional activity tolerance OT Short Term Goal 4 (Week 1): Pt will complete toileting task with mod A  Skilled Therapeutic Interventions/Progress Updates: Patient skilled OT services this session as follows:    UB dressing=setup;   LB dressing=setup;   Participated in Sit to stand (Min A), standing tolerance (Min A but unable to maintain more than 3 seconds without losing balance laterally left) and activitiy tolerance seated  Patient complained that he was 'disgusted and disappointed' with his right knee not holding him up today and being painful.  As well, he stated that though he did not feel 'swimmy headed,' that he was very fatigued today as compared to yesterday - when he stated he was able to walk and participate in therapy with higher function and balance.     Therapy Documentation Precautions:  Precautions Precautions: Fall, Other (comment) Precaution Comments: seizure; orthostatic - watch BPs Required Braces or Orthoses: Other Brace/Splint Other Brace/Splint: R knee brace Restrictions Weight Bearing Restrictions: No General: General OT Amount of Missed Time: 15 Minutes (15)   Pain:denied   See Function Navigator for Current Functional Status.   Therapy/Group: Individual Therapy  Alfredia Ferguson [facility] 06/16/2016, 7:44 PM

## 2016-06-16 NOTE — Progress Notes (Signed)
Subjective/Complaints:  Had incontinence of bladder last night x 1  ROS: pt denies nausea, vomiting, diarrhea, cough, shortness of breath or chest pain  Objective: Vital Signs: Blood pressure 97/60, pulse 63, temperature 98.2 F (36.8 C), temperature source Oral, resp. rate 18, height 5' 7"  (1.702 m), weight 76.2 kg (167 lb 14.4 oz), SpO2 97 %. No results found. Results for orders placed or performed during the hospital encounter of 06/10/16 (from the past 72 hour(s))  Basic metabolic panel     Status: Abnormal   Collection Time: 06/15/16  5:37 AM  Result Value Ref Range   Sodium 133 (L) 135 - 145 mmol/L   Potassium 4.0 3.5 - 5.1 mmol/L   Chloride 102 101 - 111 mmol/L   CO2 23 22 - 32 mmol/L   Glucose, Bld 92 65 - 99 mg/dL   BUN 14 6 - 20 mg/dL   Creatinine, Ser 0.94 0.61 - 1.24 mg/dL   Calcium 9.0 8.9 - 10.3 mg/dL   GFR calc non Af Amer >60 >60 mL/min   GFR calc Af Amer >60 >60 mL/min    Comment: (NOTE) The eGFR has been calculated using the CKD EPI equation. This calculation has not been validated in all clinical situations. eGFR's persistently <60 mL/min signify possible Chronic Kidney Disease.    Anion gap 8 5 - 15     HEENT: normal Cardio: IRR/IRR Resp: CTA B GI: +BS, NT,ND Extremity:  No Edema, No calf tenderness Skin:  No skin breakdown Neuro: reduced sensation RUE and RLE to LT, HOH Musc/Skel:  Other Right knee pain and decreased ROM Gen NAD   Assessment/Plan: 1. 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 Physiatrist is providing close team supervision and 24 hour management of active medical problems listed below. Physiatrist and rehab team continue to assess barriers to discharge/monitor patient progress toward functional and medical goals. FIM: Function - Bathing Position: Shower Body parts bathed by patient:  Right arm, Buttocks, Left lower leg, Back, Right upper leg, Left arm, Chest, Abdomen, Left upper leg, Right lower leg, Front perineal area Assist Level: Touching or steadying assistance(Pt > 75%)  Function- Upper Body Dressing/Undressing What is the patient wearing?: Pull over shirt/dress Pull over shirt/dress - Perfomed by patient: Thread/unthread right sleeve, Thread/unthread left sleeve, Put head through opening, Pull shirt over trunk Assist Level: Supervision or verbal cues, Set up Set up : To obtain clothing/put away Function - Lower Body Dressing/Undressing What is the patient wearing?: Pants, Shoes Position: Sitting EOB Underwear - Performed by patient: Thread/unthread right underwear leg, Thread/unthread left underwear leg, Pull underwear up/down Underwear - Performed by helper: Pull underwear up/down Pants- Performed by patient: Thread/unthread right pants leg, Thread/unthread left pants leg, Pull pants up/down Pants- Performed by helper: Pull pants up/down Non-skid slipper socks- Performed by patient: Don/doff right sock, Don/doff left sock Socks - Performed by patient: Don/doff right sock, Don/doff left sock Shoes - Performed by patient: Don/doff right shoe, Don/doff left shoe Shoes - Performed by helper: Fasten right, Fasten left TED Hose - Performed by helper: Don/doff right TED hose, Don/doff left TED hose Assist for footwear: Supervision/touching assist Assist for lower body dressing: Supervision or verbal cues  Function - Toileting Toileting steps completed by patient: Adjust clothing prior to toileting, Performs perineal hygiene Toileting steps completed by helper: Adjust clothing after toileting Toileting Assistive Devices: Grab bar or rail Assist level: Touching  or steadying assistance (Pt.75%)  Function - Toilet Transfers Toilet transfer assistive device: Elevated toilet seat/BSC over toilet, Grab bar Assist level to toilet: Touching or steadying assistance (Pt >  75%) Assist level from toilet: Touching or steadying assistance (Pt > 75%)  Function - Chair/bed transfer Chair/bed transfer method: Squat pivot Chair/bed transfer assist level: Touching or steadying assistance (Pt > 75%) Chair/bed transfer assistive device: Armrests, Walker  Function - Locomotion: Wheelchair Will patient use wheelchair at discharge?: Yes Type: Manual Max wheelchair distance: 161f  Assist Level: Supervision or verbal cues Assist Level: Supervision or verbal cues Assist Level: Supervision or verbal cues Turns around,maneuvers to table,bed, and toilet,negotiates 3% grade,maneuvers on rugs and over doorsills: No Function - Locomotion: Ambulation Ambulation activity did not occur: Safety/medical concerns (orthostatic with minimal standing) Assistive device: Walker-rolling Max distance: 152f Assist level: Touching or steadying assistance (Pt > 75%) Walk 10 feet activity did not occur: Safety/medical concerns Assist level: Touching or steadying assistance (Pt > 75%) Walk 50 feet with 2 turns activity did not occur: Safety/medical concerns Walk 150 feet activity did not occur: Safety/medical concerns Walk 10 feet on uneven surfaces activity did not occur: Safety/medical concerns  Function - Comprehension Comprehension: Auditory Comprehension assistive device: Hearing aids Comprehension assist level: Follows basic conversation/direction with no assist  Function - Expression Expression: Verbal Expression assist level: Expresses basic needs/ideas: With no assist  Function - Social Interaction Social Interaction assist level: Interacts appropriately 90% of the time - Needs monitoring or encouragement for participation or interaction.  Function - Problem Solving Problem solving assist level: Solves basic problems with no assist  Function - Memory Memory assist level: Recognizes or recalls 75 - 89% of the time/requires cueing 10 - 24% of the time Patient normally  able to recall (first 3 days only): Current season, Location of own room, Staff names and faces, That he or she is in a hospital   Medical Problem List and Plan: 1.  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  DVT Prophylaxis/Anticoagulation: Pharmaceutical: Other (comment)--Eliquis, no signs of DVT 3. Pain Management: Continue Voltaren gel for knee pain.  4. Mood: Wife supportive and is staying to provide psychological support.  5. Neuropsych: This patient is not  capable of making decisions on his own behalf. 6. Skin/Wound Care: Routine pressure relief measures.  7. Fluids/Electrolytes/Nutrition: Monitor I/O. Offer supplements between meals  8. A fib with RVR: Monitor HR bid--continue Eliquis, metoprolol.rate is controlled at present  9.  Leucocytosis: Continue to monitor for signs of infection.  10. GERD: Managed by Protonix.   12. Seizure prophylaxis: started on Keppra--monitor for tolerance.  13. Diastolic dysfunction: Monitor signs/symptoms of fluid overload. 14. Hyponatremia: improved from 130->133 15. ABLA: Hgb improved from 10.8->11.6->11.8gm 16.  Orthostatic hypotension resolved off flomax , BP still on low side but no dizziness     17. Urinary incontinence: only one episode  -follow for recurrence/pattern  -discussed fluid "rationing"--drinking more in morning/afternoon , less at night. Needs to empty before bed too   LOS (Days) 6 A FACE TO FACE EVALUATION WAS PERFORMED  Imanuel Pruiett T 06/16/2016, 8:00 AM

## 2016-06-17 ENCOUNTER — Inpatient Hospital Stay (HOSPITAL_COMMUNITY): Payer: Medicare Other

## 2016-06-17 NOTE — Progress Notes (Signed)
Subjective/Complaints:  Had a fairly uneventful night.   ROS: pt denies nausea, vomiting, diarrhea, cough, shortness of breath or chest pain  Objective: Vital Signs: Blood pressure 121/73, pulse (!) 58, temperature 98.9 F (37.2 C), temperature source Oral, resp. rate 18, height 5' 7" (1.702 m), weight 76.3 kg (168 lb 1.9 oz), SpO2 98 %. No results found. Results for orders placed or performed during the hospital encounter of 06/10/16 (from the past 72 hour(s))  Basic metabolic panel     Status: Abnormal   Collection Time: 06/15/16  5:37 AM  Result Value Ref Range   Sodium 133 (L) 135 - 145 mmol/L   Potassium 4.0 3.5 - 5.1 mmol/L   Chloride 102 101 - 111 mmol/L   CO2 23 22 - 32 mmol/L   Glucose, Bld 92 65 - 99 mg/dL   BUN 14 6 - 20 mg/dL   Creatinine, Ser 0.94 0.61 - 1.24 mg/dL   Calcium 9.0 8.9 - 10.3 mg/dL   GFR calc non Af Amer >60 >60 mL/min   GFR calc Af Amer >60 >60 mL/min    Comment: (NOTE) The eGFR has been calculated using the CKD EPI equation. This calculation has not been validated in all clinical situations. eGFR's persistently <60 mL/min signify possible Chronic Kidney Disease.    Anion gap 8 5 - 15     HEENT: normal Cardio: IRR/IRR Resp: CTA B GI: +BS, NT,ND Extremity:  No Edema, No calf tenderness Skin:  No skin breakdown Neuro: reduced sensation RUE and RLE to LT, HOH Musc/Skel:  Other Right knee pain and decreased ROM Gen NAD   Assessment/Plan: 1. 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 Physiatrist is providing close team supervision and 24 hour management of active medical problems listed below. Physiatrist and rehab team continue to assess barriers to discharge/monitor patient progress toward functional and medical goals. FIM: Function - Bathing Position: Shower Body parts bathed by patient: Right arm,  Buttocks, Left lower leg, Back, Right upper leg, Left arm, Chest, Abdomen, Left upper leg, Right lower leg, Front perineal area Assist Level: Touching or steadying assistance(Pt > 75%)  Function- Upper Body Dressing/Undressing What is the patient wearing?: Pull over shirt/dress Pull over shirt/dress - Perfomed by patient: Thread/unthread right sleeve, Thread/unthread left sleeve, Put head through opening, Pull shirt over trunk Assist Level: Supervision or verbal cues, Set up Set up : To obtain clothing/put away Function - Lower Body Dressing/Undressing What is the patient wearing?: Pants, Shoes Position: Sitting EOB Underwear - Performed by patient: Thread/unthread right underwear leg, Thread/unthread left underwear leg, Pull underwear up/down Underwear - Performed by helper: Pull underwear up/down Pants- Performed by patient: Thread/unthread right pants leg, Thread/unthread left pants leg, Pull pants up/down Pants- Performed by helper: Pull pants up/down Non-skid slipper socks- Performed by patient: Don/doff right sock, Don/doff left sock Socks - Performed by patient: Don/doff right sock, Don/doff left sock Shoes - Performed by patient: Don/doff right shoe, Don/doff left shoe Shoes - Performed by helper: Fasten right, Fasten left TED Hose - Performed by helper: Don/doff right TED hose, Don/doff left TED hose Assist for footwear: Supervision/touching assist Assist for lower body dressing: Supervision or verbal cues  Function - Toileting Toileting steps completed by patient: Adjust clothing prior to toileting, Performs perineal hygiene Toileting steps completed by helper: Adjust clothing after toileting Toileting Assistive Devices: Grab bar or rail Assist level: Touching or  steadying assistance (Pt.75%)  Function - Toilet Transfers Toilet transfer assistive device: Elevated toilet seat/BSC over toilet Assist level to toilet: Touching or steadying assistance (Pt > 75%) Assist level from  toilet: Touching or steadying assistance (Pt > 75%)  Function - Chair/bed transfer Chair/bed transfer method: Squat pivot Chair/bed transfer assist level: Touching or steadying assistance (Pt > 75%) Chair/bed transfer assistive device: Armrests, Walker  Function - Locomotion: Wheelchair Will patient use wheelchair at discharge?: Yes Type: Manual Max wheelchair distance: 130f  Assist Level: Supervision or verbal cues Assist Level: Supervision or verbal cues Assist Level: Supervision or verbal cues Turns around,maneuvers to table,bed, and toilet,negotiates 3% grade,maneuvers on rugs and over doorsills: No Function - Locomotion: Ambulation Ambulation activity did not occur: Safety/medical concerns (orthostatic with minimal standing) Assistive device: Walker-rolling Max distance: 190f Assist level: Touching or steadying assistance (Pt > 75%) Walk 10 feet activity did not occur: Safety/medical concerns Assist level: Touching or steadying assistance (Pt > 75%) Walk 50 feet with 2 turns activity did not occur: Safety/medical concerns Walk 150 feet activity did not occur: Safety/medical concerns Walk 10 feet on uneven surfaces activity did not occur: Safety/medical concerns  Function - Comprehension Comprehension: Auditory Comprehension assistive device: Hearing aids Comprehension assist level: Follows basic conversation/direction with no assist  Function - Expression Expression: Verbal Expression assist level: Expresses basic needs/ideas: With no assist  Function - Social Interaction Social Interaction assist level: Interacts appropriately 90% of the time - Needs monitoring or encouragement for participation or interaction.  Function - Problem Solving Problem solving assist level: Solves basic problems with no assist  Function - Memory Memory assist level: Recognizes or recalls 75 - 89% of the time/requires cueing 10 - 24% of the time Patient normally able to recall (first 3  days only): Current season, Location of own room, Staff names and faces, That he or she is in a hospital   Medical Problem List and Plan: 1.  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  DVT Prophylaxis/Anticoagulation: Pharmaceutical: Other (comment)--Eliquis, no signs of DVT 3. Pain Management: Continue Voltaren gel for knee pain.  4. Mood: Wife supportive and is staying to provide psychological support.  5. Neuropsych: This patient is not  capable of making decisions on his own behalf. 6. Skin/Wound Care: Routine pressure relief measures.  7. Fluids/Electrolytes/Nutrition: Monitor I/O. Offer supplements between meals  8. A fib with RVR: Monitor HR bid--continue Eliquis, metoprolol.rate is controlled at present  9.  Leucocytosis: Continue to monitor for signs of infection.  10. GERD: Managed by Protonix.   12. Seizure prophylaxis: started on Keppra--monitor for tolerance.  13. Diastolic dysfunction: Monitor signs/symptoms of fluid overload. 14. Hyponatremia: improved from 130->133 15. ABLA: Hgb improved from 10.8->11.6->11.8gm 16.  Orthostatic hypotension resolved off flomax , BP still on low side but no dizziness     17. Urinary incontinence: only one episode on Friday night  -have reviewed fluid "rationing"--(drinking more in morning/afternoon , less at night). Needs to empty before bed too   LOS (Days) 7 A FACE TO FACE EVALUATION WAS PERFORMED  Herbert Marquez T 06/17/2016, 8:31 AM

## 2016-06-17 NOTE — Progress Notes (Signed)
Occupational Therapy Session Note  Patient Details  Name: Herbert Marquez MRN: [id number] Date of Birth: 05-21-1927  Today's Date: 06/17/2016 OT Individual Time: 1308-6578 OT Individual Time Calculation (min): 41 min    Short Term Goals: Week 1:  OT Short Term Goal 1 (Week 1): Pt will complete squat pivot transfer to toilet with min A OT Short Term Goal 2 (Week 1): Pt will dress LB with steadying assist OT Short Term Goal 3 (Week 1): Pt will complete 1 grooming task in standing with min A for functional activity tolerance OT Short Term Goal 4 (Week 1): Pt will complete toileting task with mod A  Skilled Therapeutic Interventions/Progress Updates:    1:1. No pain reported. Focus on ADL retraining and functional transfers. Pt agreeable to bathe and dress. Pt squat pivot transfer with supervision with VC for safety awareness and use of grab bars w/c<>shower chair. Pt bathes 10/10 body parts with supervision and completes with to stand to wash buttocks. Pt dresses sit to stand level at sink with steadying assist. Pt able to assume seated figure 4 to thread BLE into underwear and pants and don non slip socks. OT recommend donning both underwear and pants prior to advancing pants over hips for energy conservation. Seated in w/c at sink pt combs hair, uses mouth wash, and shaves. Exited session with pt seated in w/c with call light in reach.   Therapy Documentation Precautions:  Precautions Precautions: Fall, Other (comment) Precaution Comments: seizure; orthostatic - watch BPs Required Braces or Orthoses: Other Brace/Splint Other Brace/Splint: R knee brace Restrictions Weight Bearing Restrictions: No  See Function Navigator for Current Functional Status.   Therapy/Group: Individual Therapy  Tonny Branch 06/17/2016, 9:58 AM

## 2016-06-18 ENCOUNTER — Inpatient Hospital Stay (HOSPITAL_COMMUNITY): Payer: Medicare Other | Admitting: Occupational Therapy

## 2016-06-18 ENCOUNTER — Inpatient Hospital Stay (HOSPITAL_COMMUNITY): Payer: Medicare Other | Admitting: Physical Therapy

## 2016-06-18 ENCOUNTER — Ambulatory Visit: Payer: Medicare Other | Admitting: Nurse Practitioner

## 2016-06-18 DIAGNOSIS — I63432 Cerebral infarction due to embolism of left posterior cerebral artery: Secondary | ICD-10-CM

## 2016-06-18 NOTE — Progress Notes (Signed)
Occupational Therapy Session Note  Patient Details  Name: Herbert Marquez MRN: [id number] Date of Birth: 1927-11-27  Today's Date: 06/18/2016 OT Individual Time: 8588-5027 OT Individual Time Calculation (min): 75 min    Short Term Goals: Week 1:  OT Short Term Goal 1 (Week 1): Pt will complete squat pivot transfer to toilet with min A OT Short Term Goal 2 (Week 1): Pt will dress LB with steadying assist OT Short Term Goal 3 (Week 1): Pt will complete 1 grooming task in standing with min A for functional activity tolerance OT Short Term Goal 4 (Week 1): Pt will complete toileting task with mod A  Skilled Therapeutic Interventions/Progress Updates:    Pt seen for OT session focusing on UE strengthening, mobility, and general activity tolerance. Pt sitting up in w/c upon arrival, agreeable to tx session. He declined bathing/dressing today. He completed oral care task standing at sink with CGA for standing balance/ endurance, tolerated ~2 minutes in standing before requesting seated rest break. He self propelled w/c to therapy gym with UE strengthening. Throughout session, he completed squat pivot transfers, close supervision- min A with VCs for hand placement during transfer. Completed 10 minutes on NuStep level 3 using B UEs/LE with assist for placement of R leg onto footplate.  He then returned to supine on mat, completed x10 chest press and x10 overhead press with multimodal cuing for proper form and technique. Seated EOM, completed routine home task of changing pillow cases focusing on B UE simultaneous use.  He ambulated ~8ft with RW and min A to w/c, noted pt to put very little weight on R LE when ambulating, pt citing due to R knee weakness. He self propelled w/c remainder of way to room. Returned to supine in bed to rest, left with all needs in reach and bed alarm on.   Therapy Documentation Precautions:  Precautions Precautions: Fall, Other (comment) Precaution Comments: seizure;  orthostatic - watch BPs Required Braces or Orthoses: Other Brace/Splint Other Brace/Splint: R knee brace Restrictions Weight Bearing Restrictions: No Pain:    No/ denies pain  See Function Navigator for Current Functional Status.   Therapy/Group: Individual Therapy  Lewis, Shinika Estelle C 06/18/2016, 7:15 AM

## 2016-06-18 NOTE — Progress Notes (Signed)
Physical Therapy Session Note  Patient Details  Name: MUADH CREASY MRN: [id number] Date of Birth: 11-10-1927  Today's Date: 06/18/2016 PT Individual Time: 1600-1630 PT Individual Time Calculation (min): 30 min   Short Term Goals: Week 2:   =LTGs  Skilled Therapeutic Interventions/Progress Updates:    c/o pain in R hip/knee but reports no need for intervention at this time.  Session focus on activity tolerance via w/c propulsion to and from therapy gym and LE therex. Pt completes 2 trials of BLE therex (seated marching, LAQ, and heel/toe raises in sitting) to fatigue.  Pt requires occasional short rest breaks 2/2 fatigue.  Returned to room at end of session and agreeable to remain in chair.  Wife present, call bell in reach and needs met.   Therapy Documentation Precautions:  Precautions Precautions: Fall, Other (comment) Precaution Comments: seizure; orthostatic - watch BPs Required Braces or Orthoses: Other Brace/Splint Other Brace/Splint: R knee brace Restrictions Weight Bearing Restrictions: No   See Function Navigator for Current Functional Status.   Therapy/Group: Individual Therapy  Earnest Conroy Penven-Crew 06/18/2016, 4:40 PM

## 2016-06-18 NOTE — Progress Notes (Signed)
Social Work Patient ID: Herbert Marquez, male   DOB: [birth year], 83 y.o.   MRN: [id number]  Team feels pt will be ready by Wed for discharge, MD aware and reports medically ready for discharge Wed also. Pt and wife pleased and will work toward discharge Wed.

## 2016-06-18 NOTE — Progress Notes (Signed)
Subjective/Complaints:  Pt and wife would like to leave earlier than Friday  ROS: pt denies nausea, vomiting, diarrhea, cough, shortness of breath or chest pain  Objective: Vital Signs: Blood pressure (!) 108/57, pulse 68, temperature 98.9 F (37.2 C), temperature source Oral, resp. rate 16, height 5\' 7"  (1.702 m), weight 76.4 kg (168 lb 5.8 oz), SpO2 98 %. No results found. No results found for this or any previous visit (from the past 72 hour(s)).   HEENT: normal Cardio: IRR/IRR Resp: CTA B GI: +BS, NT,ND Extremity:  No Edema, No calf tenderness Skin:  No skin breakdown Neuro: reduced sensation RUE and RLE to LT, HOH Musc/Skel:  Other Right knee pain and decreased ROM Gen NAD   Assessment/Plan: 1. 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 Physiatrist is providing close team supervision and 24 hour management of active medical problems listed below. Physiatrist and rehab team continue to assess barriers to discharge/monitor patient progress toward functional and medical goals. FIM: Function - Bathing Position: Shower Body parts bathed by patient: Right arm, Buttocks, Left lower leg, Back, Right upper leg, Left arm, Chest, Abdomen, Left upper leg, Right lower leg, Front perineal area Assist Level: Supervision or verbal cues  Function- Upper Body Dressing/Undressing What is the patient wearing?: Pull over shirt/dress Pull over shirt/dress - Perfomed by patient: Thread/unthread right sleeve, Thread/unthread left sleeve, Put head through opening, Pull shirt over trunk Assist Level: Supervision or verbal cues, Set up Set up : To obtain clothing/put away Function - Lower Body Dressing/Undressing What is the patient wearing?: Pants, Non-skid slipper socks, Underwear Position: Wheelchair/chair at sink Underwear - Performed by patient:  Thread/unthread right underwear leg, Thread/unthread left underwear leg, Pull underwear up/down Underwear - Performed by helper: Pull underwear up/down Pants- Performed by patient: Thread/unthread right pants leg, Thread/unthread left pants leg, Pull pants up/down Pants- Performed by helper: Pull pants up/down Non-skid slipper socks- Performed by patient: Don/doff right sock, Don/doff left sock Socks - Performed by patient: Don/doff right sock, Don/doff left sock Shoes - Performed by patient: Don/doff right shoe, Don/doff left shoe Shoes - Performed by helper: Fasten right, Fasten left TED Hose - Performed by helper: Don/doff right TED hose, Don/doff left TED hose Assist for footwear: Supervision/touching assist Assist for lower body dressing: Supervision or verbal cues  Function - Toileting Toileting steps completed by patient: Adjust clothing prior to toileting, Performs perineal hygiene Toileting steps completed by helper: Adjust clothing after toileting Toileting Assistive Devices: Grab bar or rail Assist level: Touching or steadying assistance (Pt.75%)  Function - Air cabin crew transfer assistive device: Elevated toilet seat/BSC over toilet Assist level to toilet: Touching or steadying assistance (Pt > 75%) Assist level from toilet: Touching or steadying assistance (Pt > 75%)  Function - Chair/bed transfer Chair/bed transfer method: Squat pivot Chair/bed transfer assist level: Touching or steadying assistance (Pt > 75%) Chair/bed transfer assistive device: Armrests, Walker  Function - Locomotion: Wheelchair Will patient use wheelchair at discharge?: Yes Type: Manual Max wheelchair distance: 169ft  Assist Level: Supervision or verbal cues Assist Level: Supervision or verbal cues Assist Level: Supervision or verbal cues Turns around,maneuvers to table,bed, and toilet,negotiates 3% grade,maneuvers on rugs and over doorsills: No Function - Locomotion:  Ambulation Ambulation activity did not occur: Safety/medical concerns (orthostatic with minimal standing) Assistive device: Walker-rolling Max distance: 30ft  Assist level: Touching or steadying assistance (Pt > 75%) Walk 10  feet activity did not occur: Safety/medical concerns Assist level: Touching or steadying assistance (Pt > 75%) Walk 50 feet with 2 turns activity did not occur: Safety/medical concerns Walk 150 feet activity did not occur: Safety/medical concerns Walk 10 feet on uneven surfaces activity did not occur: Safety/medical concerns  Function - Comprehension Comprehension: Auditory Comprehension assistive device: Hearing aids Comprehension assist level: Follows basic conversation/direction with no assist  Function - Expression Expression: Verbal Expression assist level: Expresses basic needs/ideas: With no assist  Function - Social Interaction Social Interaction assist level: Interacts appropriately 90% of the time - Needs monitoring or encouragement for participation or interaction.  Function - Problem Solving Problem solving assist level: Solves basic problems with no assist  Function - Memory Memory assist level: Recognizes or recalls 75 - 89% of the time/requires cueing 10 - 24% of the time Patient normally able to recall (first 3 days only): Current season, Location of own room, Staff names and faces, That he or she is in a hospital   Medical Problem List and Plan: 1.  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  DVT Prophylaxis/Anticoagulation: Pharmaceutical: Other (comment)--Eliquis, no signs of DVT 3. Pain Management: Continue Voltaren gel for knee pain.  4. Mood: Wife supportive and is staying to provide psychological support.  5. Neuropsych: This patient is not  capable of making decisions on his own  behalf. 6. Skin/Wound Care: Routine pressure relief measures.  7. Fluids/Electrolytes/Nutrition: Monitor I/O. Offer supplements between meals  8. A fib with RVR: Monitor HR bid--continue Eliquis, metoprolol.rate is controlled at present  9.  Leucocytosis: Continue to monitor for signs of infection.  10. GERD: Managed by Protonix.   12. Seizure prophylaxis: started on Keppra--monitor for tolerance.  13. Diastolic dysfunction: Monitor signs/symptoms of fluid overload. 14. Hyponatremia: improved from 130->133 15. ABLA: Hgb improved from 10.8->11.6->11.8gm 16.  Orthostatic hypotension resolved off flomax , BP still on low side but no dizziness     17. Urinary incontinence: nocturia overall improved off flomax  LOS (Days) 8 A FACE TO FACE EVALUATION WAS PERFORMED  Glendola Friedhoff E 06/18/2016, 7:23 AM

## 2016-06-18 NOTE — Progress Notes (Signed)
Physical Therapy Session Note  Patient Details  Name: Herbert Marquez MRN: [id number] Date of Birth: [birth year]  Today's Date: 06/18/2016 PT Individual Time: 0805-0900 and 1415-1500 PT Individual Time Calculation (min): 55 min and 45 min  Short Term Goals: Week 1:  PT Short Term Goal 1 (Week 1): Pt will be able to perform basic transfers with min assist PT Short Term Goal 2 (Week 1): Pt will be able to tolerate standing without changes in vital signs x 3 min during functional activity PT Short Term Goal 3 (Week 1): Pt will be able to gait x 25' with min assist  Skilled Therapeutic Interventions/Progress Updates: Tx1: Pt presented at toilet, mod I with peri care. Performed squat picot to R with minA. Donned LE clothing with set up, UE mod I, shoes minA for time management. Propelled to gym >127f with supervision. Performed standing tolerance activities, pipe tree for endurance tolerance. Pt able to tolerate standing up to approx 4 min with no LOB and requiring extended rest breaks. Squat pivot transfer to R minA to w/c. W/c propulsion to nsg station then ambulated approx 847fwith RW with minA. Pt demonstrating good placement of RW and requiring cues to increase R wt shift. Pt returned to w/c and returned to room with wife present and needs met.   Tx2: Pt presented sitting at EOB agreeable to therapy however stating increased fatigue. Performed squat pivot transfer to w/c, transported to rehab gym for energy conservation. Squat pivot to NuStep and performed NuStep for strengthening and endurance L3 x 10 min. Pt ambulated 5 ft to mat with RW and minA. Attempted standing balance however pt unable to tolerate standing for greater than 10 sec due to fatigue and increased LLE pain. Placed pt sitting on air disk and performed ball toss for core strengthening and unsupported sitting tolerance. Pt able to maintain sitting balance with mod challenges  With x 1 occurrence of use of LUE for correction. Pt  performed ambulatory transfer with RW to w/c with noted decreased wt bearing on RLE. Pt propelled back to room with supervision and remained in w/c at end of session with wife present and needs met.      Therapy Documentation Precautions:  Precautions Precautions: Fall, Other (comment) Precaution Comments: seizure; orthostatic - watch BPs Required Braces or Orthoses: Other Brace/Splint Other Brace/Splint: R knee brace Restrictions Weight Bearing Restrictions: No General:   Vital Signs:  Pain: Pain Assessment Pain Assessment: 0-10 Pain Score: 0-No pain   See Function Navigator for Current Functional Status.   Therapy/Group: Individual Therapy  Frederich Montilla  Bryttani Blew, PTA  06/18/2016, 12:38 PM

## 2016-06-19 ENCOUNTER — Encounter (HOSPITAL_COMMUNITY): Payer: Self-pay

## 2016-06-19 ENCOUNTER — Inpatient Hospital Stay (HOSPITAL_COMMUNITY): Payer: Medicare Other | Admitting: Physical Therapy

## 2016-06-19 ENCOUNTER — Inpatient Hospital Stay (HOSPITAL_COMMUNITY): Payer: Medicare Other | Admitting: Occupational Therapy

## 2016-06-19 DIAGNOSIS — M1711 Unilateral primary osteoarthritis, right knee: Secondary | ICD-10-CM

## 2016-06-19 MED ORDER — DICLOFENAC SODIUM 1 % TD GEL: 2.0000 g | Freq: Four times a day (QID) | TRANSDERMAL | 1 refills | Status: DC

## 2016-06-19 MED ORDER — LEVETIRACETAM 500 MG PO TABS: 500.0000 mg | ORAL_TABLET | Freq: Two times a day (BID) | ORAL | 1 refills | Status: DC

## 2016-06-19 NOTE — Progress Notes (Signed)
Occupational Therapy Session Note  Patient Details  Name: Herbert Marquez MRN: [id number] Date of Birth: 10/21/1927  Today's Date: 06/19/2016 OT Individual Time: 0845-1000 1320-1405 OT Individual Time Calculation (min): 75 min and 45 min   Short Term Goals: Week 1:  OT Short Term Goal 1 (Week 1): Pt will complete squat pivot transfer to toilet with min A OT Short Term Goal 2 (Week 1): Pt will dress LB with steadying assist OT Short Term Goal 3 (Week 1): Pt will complete 1 grooming task in standing with min A for functional activity tolerance OT Short Term Goal 4 (Week 1): Pt will complete toileting task with mod A  Skilled Therapeutic Interventions/Progress Updates:    Session One: Pt seen for OT session focusing on ADL/IADL re-training and functional transfers. Pt in w/c at sink upon arrival with wife present, completing grooming tasks. He denied bathing this morning, opting to do it next session.  He dressed seated in w/c, stood at sink to pull up pants. Pt with B knees buckling upon standing, requiring several trials to obtain full upright standing and heavy steadying assist from therapist.  He completed squat pivot transfer w/c <> BSC over toilet with VCs from therapist for technique, lateral leans for clothing management to pull pants down and steadying assist when standing to pull pants up. Discussed with pt modifying tasks/ ADLs based on "good days" and "bad days" with knee and recommended squat vs stand when knee is painful/ weak. He self propelled w/c throughout unit with supervision for UE strengthening. Completed simple kitchen mobility work from w/c level practicing obtaining items from refrigerator and transporting items. VCs for use of w/c brakes when reaching. Completed squat pivot <> low soft surface couch with supervision. Attempted use of NuStep per pt request, however, due to knee pain/ weakness pt unable to tolerate. He returned to room at end of session, left seated in w/c  with all needs in reach.   Session Two: Pt seen for OT ADL bathing/dressing session. Pt asleep in supine upon arrival, easily awoken and agreeable to tx session . He denied any pain this session. He completed squat pivot transfers throughout session with supervision- min A. He transferred from w/c onto tub transfer bench and bathed with supervision from seated position, VCs for use of lateral leans for buttock hygiene. He dressed seated on toilet, requesting assist from therapist to pull pants up due to fatigue. Grooming completed mod I at sink. Pt declined remainder of tx time due to fatigue, he was left seated in w/c with wife present and all needs in reach.  Educated throughout session regarding recommendation for squat pivot vs stand pivot transfers, especially when fatigued for energy conservation and increased safety due to hx of knee pain and instability, pt voiced understanding.    Therapy Documentation Precautions:  Precautions Precautions: Fall, Other (comment) Precaution Comments: seizure; orthostatic - watch BPs Required Braces or Orthoses: Other Brace/Splint Other Brace/Splint: R knee brace Restrictions Weight Bearing Restrictions: No  See Function Navigator for Current Functional Status.   Therapy/Group: Individual Therapy  Lewis, Ramin Zoll C 06/19/2016, 7:05 AM

## 2016-06-19 NOTE — Discharge Summary (Signed)
Physician Discharge Summary  Patient ID: Herbert Marquez MRN: [id number] DOB/AGE: 81-Dec-1929 81 y.o.  Admit date: 06/10/2016 Discharge date: 06/20/2016  Discharge Diagnoses:  Principal Problem:   Embolic stroke [facility]) Active Problems:   Gait disturbance, post-stroke   Orthostatic hypotension   Acute encephalopathy   Altered mental status   Discharged Condition: stable    Significant Diagnostic Studies: N/A   Labs:  Basic Metabolic Panel: BMP Latest Ref Rng & Units 06/15/2016 06/12/2016 06/11/2016  Glucose 65 - 99 mg/dL 92 99 111(H)  BUN 6 - 20 mg/dL 14 12 17   Creatinine 0.61 - 1.24 mg/dL 0.94 0.90 0.95  Sodium 135 - 145 mmol/L 133(L) 133(L) 127(L)  Potassium 3.5 - 5.1 mmol/L 4.0 3.9 4.3  Chloride 101 - 111 mmol/L 102 101 96(L)  CO2 22 - 32 mmol/L 23 24 18(L)  Calcium 8.9 - 10.3 mg/dL 9.0 8.9 8.3(L)    CBC: CBC Latest Ref Rng & Units 06/11/2016 06/11/2016 06/09/2016  WBC 4.0 - 10.5 K/uL 7.7 7.3 10.7(H)  Hemoglobin 13.0 - 17.0 g/dL 11.8(L) 11.6(L) 10.8(L)  Hematocrit 39.0 - 52.0 % 34.1(L) 34.3(L) 33.5(L)  Platelets 150 - 400 K/uL 358 441(H) 369    CBG: No results for input(s): GLUCAP in the last 168 hours.   Brief HPI:   Herbert Marquez a 83 y.o.malewith history of PAF, dementia, gait disorder due to OA right knee, CAD, CVA 06/2009, recent left thalamic stroke with SDH --CIR stay and discharged to home at supervision level due to ataxia.  He was admitted on 06/04/16 via urology office with facial changes followed by mental status changes with agitation. MRI brain was limited due to severe agitation and wasnegative for large acute infarct.  EEG done revealing moderate diffuse slowing. He was started on IV antibiotics due to fever with leucocytosis.    Repeat MRI/MRA  4/5 showed acute right hippocampal infarct and he is to continue Eliquis for embolic stroke. Dr. Shon Hale felt that encephalopathy due to seizure as patient at high risk from his recent temporal lobe infarct and  recommended Keppra for treatment. Pt noted to be tachycardic and evaluated by Cardiology, who increased beta blocker and cleared for admission to inpatient rehab 4/7. His mentation was improving and CIR was recommended for follow up therapy.    Hospital Course: Herbert Marquez was admitted to rehab 06/10/2016 for inpatient therapies to consist of PT, ST and OT at least three hours five days a week. Past admission physiatrist, therapy team and rehab RN have worked together to provide customized collaborative inpatient rehab. His mentation has improved and he is back to baseline cognitively. He was noted to be severely orthostatic at admission and abdominal binder as well as TEDs were ordered for support. Metoprolol and Flomax were discontinued with improvement in BP and symptoms. Pain in right knee and hip have been managed with use of Voltaren gel. Po intake has been good and he was advanced to regular diet by discharge. Follow up labs done revealing drop in sodium to 127. Follow up shows improvement back to baseline. Leucocytosis has resolved and he has been afebrile during his rehab stay. No seizure activity or agitation noted during his stay. He has made steady progress and is at supervision level. He will continue to receive follow up [place] [place] [facility] [place] [facility] [place] course: During patient's stay in rehab weekly team conferences were held to monitor patient's progress, set goals and discuss barriers to discharge. At admission, patient required  mod assist with ADL tasks and mobility. His speech and cognition was at baseline but he was maintained on dysphagia 2 diet due to mild oral dysphagia. He has had improvement in activity tolerance, balance, postural control, as well as ability to compensate for deficits. He is able to complete ADL tasks with supervision,.  He is able to propel his wheelchair independently for 150'. He is modified independent for transfers and is able to ambulate 11'  with RW and min assist.  He is tolerating advancement to regular diet without difficulty and is modified independent for use of compensatory strategies. Speech therapy signed off by 4/13.  Family education was completed with wife who can provide supervision as needed after discharge.    Disposition: Home   Diet: Heart Healthy.   Special Instructions: 1. Follow up with primary MD for input on flomax as blood pressure improves.   Discharge Instructions    Ambulatory referral to Physical Medicine Rehab    Complete by:  As directed    1-2 week transitional care appt     Allergies as of 06/20/2016   No Known Allergies     Medication List    STOP taking these medications   citalopram 10 MG tablet Commonly known as:  CELEXA   metoprolol tartrate 25 MG tablet Commonly known as:  LOPRESSOR   tamsulosin 0.4 MG Caps capsule Commonly known as:  FLOMAX     TAKE these medications   acetaminophen 500 MG tablet Commonly known as:  TYLENOL Take 1 tablet (500 mg total) by mouth every 6 (six) hours as needed for mild pain. What changed:  how much to take   apixaban 5 MG Tabs tablet Commonly known as:  ELIQUIS Take 1 tablet (5 mg total) by mouth 2 (two) times daily.   ARTIFICIAL TEARS 1.4 % ophthalmic solution Generic drug:  polyvinyl alcohol Place 1 drop into both eyes daily as needed for dry eyes.   atorvastatin 80 MG tablet Commonly known as:  LIPITOR Take 1 tablet (80 mg total) by mouth daily. What changed:  when to take this   BLUE-EMU MAXIMUM STRENGTH EX Apply 1 application topically at bedtime. Knee pain   diclofenac sodium 1 % Gel Commonly known as:  VOLTAREN Apply 2 g topically 4 (four) times daily.   levETIRAcetam 500 MG tablet Commonly known as:  KEPPRA Take 1 tablet (500 mg total) by mouth 2 (two) times daily.   multivitamin with minerals Tabs tablet Take 1 tablet by mouth daily.   nitroGLYCERIN 0.4 MG SL tablet Commonly known as:  NITROSTAT Place 0.4 mg  under the tongue every 5 (five) minutes as needed for chest pain.   omeprazole 20 MG capsule Commonly known as:  PRILOSEC Take 20 mg by mouth daily.      Follow-up Information    Charlett Blake, MD Follow up.   Specialty:  Physical Medicine and Rehabilitation Why:  office will call you with follow up appointment Contact information: [place] [address] [phone]        Morrison, MD. Call in 1 day(s).   Specialty:  Neurology Why:  for follow up appointment in 4 weeks Contact information: [address] [place] [address] [place], NP Follow up on 07/02/2016.   Specialty:  Family Medicine Why:  Appointment @ 11:00 am Contact information: [place] [address] [phone]        Arnette Norris  Nahser, MD Follow up.   Specialty:  Cardiology Contact information: [address] [place] [address] [phone]           Signed: Bary Leriche 06/20/2016, 9:44 AM

## 2016-06-19 NOTE — Progress Notes (Signed)
Physical Therapy Session Note  Patient Details  Name: Herbert Marquez MRN: [id number] Date of Birth: 31-May-1927  Today's Date: 06/19/2016 PT Individual Time: 1100-1200 PT Individual Time Calculation (min): 60 min   Short Term Goals: Week 1:  PT Short Term Goal 1 (Week 1): Pt will be able to perform basic transfers with min assist PT Short Term Goal 2 (Week 1): Pt will be able to tolerate standing without changes in vital signs x 3 min during functional activity PT Short Term Goal 3 (Week 1): Pt will be able to gait x 25' with min assist  Skilled Therapeutic Interventions/Progress Updates:  Pt presented in w/c agreeable to therapy. Pt indicating significantly increased pain in B knees today as pt indicates most likely due to increased activity during therapies yesterday. Propelled pt to ortho gym for energy conservation. Performed car transfer with supervision. Pt able to ambulate today 58f with RW and minA, noting decreased R wt bearing today. Performed bed mobility in room supine to/from sit with mod I and use of bed rail. Transferred back to w/c squat pivot with supervision. Pt propelled 1569fti rehab gym mod I. Performed standing tolerance activities, pt able to tolerate standing up to 1 min but limited by B knee pain. Pt returned to room and left in w/c for meal with wife present and needs met.      Therapy Documentation Precautions:  Precautions Precautions: Fall, Other (comment) Precaution Comments: seizure; orthostatic - watch BPs Required Braces or Orthoses: Other Brace/Splint Other Brace/Splint: R knee brace Restrictions Weight Bearing Restrictions: No General:   Vital Signs:   Pain: Pain Assessment Pain Assessment: 0-10 Pain Score: 3  Pain Type: Chronic pain Pain Location: Knee Pain Orientation: Right Pain Frequency: Intermittent Pain Onset: On-going Patients Stated Pain Goal: 3   See Function Navigator for Current Functional Status.   Therapy/Group: Individual  Therapy  Shenea Giacobbe  Noami Bove, PTA 06/19/2016, 12:54 PM

## 2016-06-19 NOTE — Progress Notes (Signed)
Occupational Therapy Discharge Summary  Patient Details  Name: Herbert Marquez MRN: [id number] Date of Birth: [birth year]   Patient has met 8 of 8 long term goals due to improved activity tolerance, improved balance, postural control and improved coordination.  Patient to discharge at overall Supervision level.  Patient's care partner is independent to provide the necessary physical assistance at discharge.  Pt has returned to PLOF. He is primarily at w/c level, however, is capable of short distance ambulation with min A. Pt varies on required amount of assist depending on knee pain/ fatigue, ranging from supervision- min A. Pt's wife is capable of providing needed assist at d/c and was present throughout CIR stay.   Recommendation:  Patient will benefit from ongoing skilled OT services in home health setting to continue to advance functional skills in the area of BADL.  Equipment: Pt has all needed DME from previous IPR stays  Reasons for discharge: treatment goals met and discharge from hospital  Patient/family agrees with progress made and goals achieved: Yes  OT Discharge Precautions/Restrictions  Precautions Precautions: Fall Required Braces or Orthoses: Other Brace/Splint Other Brace/Splint: R knee brace Restrictions Weight Bearing Restrictions: No Other Position/Activity Restrictions: Pain in R knee (chronic injury), and R hip (rod placed in ~Oct 2017 ). Vision/Perception  Vision- History Baseline Vision/History: No visual deficits Patient Visual Report: No change from baseline  Cognition Overall Cognitive Status: History of cognitive impairments - at baseline Arousal/Alertness: Awake/alert Orientation Level: Oriented X4 Attention: Sustained Sustained Attention: Appears intact Awareness: Appears intact Problem Solving: Appears intact Safety/Judgment: Appears intact Sensation Sensation Light Touch: Appears Intact Coordination Gross Motor Movements are Fluid and  Coordinated: No Fine Motor Movements are Fluid and Coordinated: No Finger Nose Finger Test: Decreased speed and accuracy R UE; WFL L UE Motor  Motor Motor: Abnormal postural alignment and control Motor - Discharge Observations: Generalized weakness, though much improved since admission Mobility  Bed Mobility Bed Mobility: Rolling Right;Rolling Left Rolling Right: 6: Modified independent (Device/Increase time) Rolling Left: 6: Modified independent (Device/Increase time)  Trunk/Postural Assessment  Cervical Assessment Cervical Assessment: Exceptions to [facility] Thoracic Assessment Thoracic Assessment: Exceptions to [facility] (Kyphotic) Lumbar Assessment Lumbar Assessment: Exceptions to [facility] (Posterior pelvic tilt) Postural Control Postural Control: Deficits on evaluation  Balance Balance Balance Assessed: Yes Static Sitting Balance Static Sitting - Balance Support: Feet supported Static Sitting - Level of Assistance: 6: Modified independent (Device/Increase time) Dynamic Sitting Balance Dynamic Sitting - Balance Support: During functional activity Dynamic Sitting - Level of Assistance: 5: Stand by assistance;6: Modified independent (Device/Increase time) (may vary due to level of fatigue) Static Standing Balance Static Standing - Balance Support: During functional activity;Bilateral upper extremity supported Static Standing - Level of Assistance: 6: Modified independent (Device/Increase time) Dynamic Standing Balance Dynamic Standing - Level of Assistance: 4: Min assist;5: Stand by assistance (May vary due to level of Knee pain and level of fatigue) Extremity/Trunk Assessment RUE Assessment RUE Assessment: Exceptions to [facility] (3+/5 overall; hx of impaired proprioception) LUE Assessment LUE Assessment: Within Functional Limits   See Function Navigator for Current Functional Status.  Bobby Rumpf, Ronni Osterberg C 06/19/2016, 4:37 PM

## 2016-06-19 NOTE — Progress Notes (Signed)
Physical Therapy Discharge Summary  Patient Details  Name: Herbert Marquez MRN: [id number] Date of Birth: Nov 17, 1927  Today's Date: 06/19/2016 PT Individual Time: 1100-1200 PT Individual Time Calculation (min): 60 min    Patient has met 11 of 11 long term goals due to improved activity tolerance, improved balance, improved postural control, increased strength and improved coordination.  Patient to discharge at an ambulatory level [place].   Patient's care partner is independent to provide the necessary physical assistance at discharge.  Reasons goals not met: Ambulation distance met however has been variable due to pain in B knees and levels of fatigue. Pt edu has been provided for techniques and strategies for transfers for pain management.   Recommendation:  Patient will benefit from ongoing skilled PT services in home health setting to continue to advance safe functional mobility, address ongoing impairments in balance, strength, endurance, pain, neuromotor control, and minimize fall risk.  Equipment: No equipment provided  Reasons for discharge: treatment goals met  Patient/family agrees with progress made and goals achieved: Yes  PT Discharge Precautions/Restrictions   Vital Signs Therapy Vitals Temp: 97.8 F (36.6 C) Temp Source: Oral Pulse Rate: 85 Resp: 16 BP: 103/66 Patient Position (if appropriate): Sitting Oxygen Therapy SpO2: 96 % O2 Device: Not Delivered  Cognition   [facility] Sensation   appears intact Motor  Motor Motor: Abnormal postural alignment and control Motor - Discharge Observations: Generalized weakness, though much improved since admission  Mobility Bed Mobility Bed Mobility: Rolling Right;Rolling Left Rolling Right: 6: Modified independent (Device/Increase time) Rolling Left: 6: Modified independent (Device/Increase time) Transfers Transfers: Yes Locomotion  Ambulation Ambulation: Yes Ambulation/Gait Assistance: 4: Min assist Ambulation  Distance (Feet): 40 Feet Assistive device: Rolling walker Ambulation/Gait Assistance Details: Tactile cues for weight beaing Ambulation/Gait Assistance Details: decreased wt shift to R Gait Gait: Yes Gait Pattern: Impaired Gait Pattern: Step-through pattern Gait velocity: decreased Stairs / Additional Locomotion Stairs: No Wheelchair Mobility Wheelchair Mobility: Yes Wheelchair Assistance: 6: Modified independent (Device/Increase time) Environmental health practitioner: Both upper extremities Wheelchair Parts Management: Independent Distance: 138f  Trunk/Postural Assessment  Cervical Assessment Cervical Assessment: Exceptions to [facility](rounded shoulders) Thoracic Assessment Thoracic Assessment: Exceptions to [facility](Kyphotic) Lumbar Assessment Lumbar Assessment: Exceptions to [facility](Posterior pelvic tilt) Postural Control Postural Control: Deficits on evaluation  Balance Balance Balance Assessed: Yes Static Sitting Balance Static Sitting - Balance Support: Feet supported Static Sitting - Level of Assistance: 6: Modified independent (Device/Increase time) Dynamic Sitting Balance Dynamic Sitting - Balance Support: During functional activity Dynamic Sitting - Level of Assistance: 5: Stand by assistance;6: Modified independent (Device/Increase time) (may vary due to level of fatigue) Static Standing Balance Static Standing - Balance Support: During functional activity;Bilateral upper extremity supported Static Standing - Level of Assistance: 6: Modified independent (Device/Increase time) Dynamic Standing Balance Dynamic Standing - Level of Assistance: 4: Min assist;5: Stand by assistance (May vary due to level of Knee pain and level of fatigue) Extremity Assessment      RLE Assessment RLE Assessment: Exceptions to [facility] Strength RLE Overall Strength Comments: R knee sleeve and cage due to knee instability and OA LLE Assessment LLE Assessment: [facility] to [facility]  See Function Navigator  for Current Functional Status.  Rosita DeChalus 06/19/2016, 4:33 PM

## 2016-06-19 NOTE — Progress Notes (Signed)
Subjective/Complaints: Cont to have R knee pain , benefit with corticosteroid injection in past  ROS: pt denies nausea, vomiting, diarrhea, cough, shortness of breath or chest pain  Objective: Vital Signs: Blood pressure 110/60, pulse 64, temperature 98.6 F (37 C), temperature source Oral, resp. rate 16, height 5\' 7"  (1.702 m), weight 76.1 kg (167 lb 11.2 oz), SpO2 97 %. No results found. No results found for this or any previous visit (from the past 72 hour(s)).   HEENT: normal Cardio: IRR/IRR Resp: CTA B GI: +BS, NT,ND Extremity:  No Edema, No calf tenderness Skin:  No skin breakdown Neuro: reduced sensation RUE and RLE to LT, HOH Musc/Skel:  Other Right knee pain and decreased ROM, lateral jt line tenderness, no effusion Gen NAD   Assessment/Plan: 1. 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 Physiatrist is providing close team supervision and 24 hour management of active medical problems listed below. Physiatrist and rehab team continue to assess barriers to discharge/monitor patient progress toward functional and medical goals. FIM: Function - Bathing Position: Shower Body parts bathed by patient: Right arm, Buttocks, Left lower leg, Back, Right upper leg, Left arm, Chest, Abdomen, Left upper leg, Right lower leg, Front perineal area Assist Level: Supervision or verbal cues  Function- Upper Body Dressing/Undressing What is the patient wearing?: Pull over shirt/dress Pull over shirt/dress - Perfomed by patient: Thread/unthread right sleeve, Thread/unthread left sleeve, Put head through opening, Pull shirt over trunk Assist Level: Supervision or verbal cues, Set up Set up : To obtain clothing/put away Function - Lower Body Dressing/Undressing What is the patient wearing?: Pants, Non-skid slipper socks, Underwear Position:  Wheelchair/chair at sink Underwear - Performed by patient: Thread/unthread right underwear leg, Thread/unthread left underwear leg, Pull underwear up/down Underwear - Performed by helper: Pull underwear up/down Pants- Performed by patient: Thread/unthread right pants leg, Thread/unthread left pants leg, Pull pants up/down Pants- Performed by helper: Pull pants up/down Non-skid slipper socks- Performed by patient: Don/doff right sock, Don/doff left sock Socks - Performed by patient: Don/doff right sock, Don/doff left sock Shoes - Performed by patient: Don/doff right shoe, Don/doff left shoe Shoes - Performed by helper: Fasten right, Fasten left TED Hose - Performed by helper: Don/doff right TED hose, Don/doff left TED hose Assist for footwear: Supervision/touching assist Assist for lower body dressing: Supervision or verbal cues  Function - Toileting Toileting steps completed by patient: Adjust clothing prior to toileting, Performs perineal hygiene Toileting steps completed by helper: Adjust clothing after toileting Toileting Assistive Devices: Grab bar or rail Assist level: Touching or steadying assistance (Pt.75%)  Function - Air cabin crew transfer assistive device: Elevated toilet seat/BSC over toilet Assist level to toilet: Touching or steadying assistance (Pt > 75%) Assist level from toilet: Touching or steadying assistance (Pt > 75%)  Function - Chair/bed transfer Chair/bed transfer method: Squat pivot Chair/bed transfer assist level: Touching or steadying assistance (Pt > 75%) Chair/bed transfer assistive device: Armrests, Walker  Function - Locomotion: Wheelchair Will patient use wheelchair at discharge?: Yes Type: Manual Max wheelchair distance: 110ft  Assist Level: Supervision or verbal cues Assist Level: Supervision or verbal cues Assist Level: Supervision or verbal cues Turns around,maneuvers to table,bed, and toilet,negotiates 3% grade,maneuvers on rugs and  over doorsills: No Function - Locomotion: Ambulation Ambulation activity did not occur: Safety/medical concerns (orthostatic with minimal standing) Assistive device: Walker-rolling Max distance: 48ft  Assist level: Touching or  steadying assistance (Pt > 75%) Walk 10 feet activity did not occur: Safety/medical concerns Assist level: Touching or steadying assistance (Pt > 75%) Walk 50 feet with 2 turns activity did not occur: Safety/medical concerns Walk 150 feet activity did not occur: Safety/medical concerns Walk 10 feet on uneven surfaces activity did not occur: Safety/medical concerns  Function - Comprehension Comprehension: Auditory Comprehension assistive device: Hearing aids Comprehension assist level: Follows basic conversation/direction with no assist  Function - Expression Expression: Verbal Expression assist level: Expresses basic needs/ideas: With no assist  Function - Social Interaction Social Interaction assist level: Interacts appropriately 90% of the time - Needs monitoring or encouragement for participation or interaction.  Function - Problem Solving Problem solving assist level: Solves basic problems with no assist  Function - Memory Memory assist level: Recognizes or recalls 75 - 89% of the time/requires cueing 10 - 24% of the time Patient normally able to recall (first 3 days only): Current season, Location of own room, Staff names and faces, That he or she is in a hospital   Medical Problem List and Plan: 1.  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  DVT Prophylaxis/Anticoagulation: Pharmaceutical: Other (comment)--Eliquis, no signs of DVT 3. Pain Management: Continue Voltaren gel for knee pain. Xrays reviewed severe lateral and moderate medial jt space narrowing R knee 4. Mood: Wife  supportive and is staying to provide psychological support.  5. Neuropsych: This patient is not  capable of making decisions on his own behalf. 6. Skin/Wound Care: Routine pressure relief measures.  7. Fluids/Electrolytes/Nutrition: Monitor I/O. Offer supplements between meals  8. A fib with RVR: Monitor HR bid--continue Eliquis, metoprolol 9.  Leucocytosis: Continue to monitor for signs of infection.  10. GERD: Managed by Protonix.   12. Seizure prophylaxis: started on Keppra--monitor for tolerance.  13. Diastolic dysfunction: Monitor signs/symptoms of fluid overload. 14. Hyponatremia: improved from 130->133 15. ABLA: Hgb improved from 10.8->11.6->11.8gm 16.  Orthostatic hypotension resolved off flomax , BP still on low side but no dizziness     17. Urinary incontinence: nocturia overall improved off flomax  LOS (Days) 9 A FACE TO FACE EVALUATION WAS PERFORMED  , E 06/19/2016, 7:30 AM

## 2016-06-19 NOTE — Progress Notes (Signed)
Social Work  Discharge Note  The overall goal for the admission was met for:   Discharge location: [place] [facility]  Length of Stay: Yes-10 DAYS  Discharge activity level: Yes-SUPERVISION-MIN ASSIST LEVEL  Home/community participation: Yes  Services provided included: MD, RD, PT, OT, SLP, RN, CM, Pharmacy and SW  Financial Services: Medicare and Private Insurance: [facility]  Follow-up services arranged: Home Health: [facility]-PT,OT,SP and Patient/Family request agency HH: HAS BEEN AN ACTIVE PT WITH [facility], DME: NO NEEDS  Comments (or additional information):WIFE HAS BEEN HERE DAILY AND PARTICIPATED IN THERAPIES WITH PT. BOTH FEEL COMFORTABLE WITH HIS CARE AND ARE READY TO GO HOME.  Patient/Family verbalized understanding of follow-up arrangements: Yes  Individual responsible for coordination of the follow-up plan: SANDRA-WIFE  Confirmed correct DME delivered: Elease Hashimoto 06/19/2016    Elease Hashimoto

## 2016-06-20 MED ORDER — DICLOFENAC SODIUM 1 % TD GEL: 2.0000 g | Freq: Four times a day (QID) | TRANSDERMAL | 1 refills | Status: DC

## 2016-06-20 MED ORDER — LEVETIRACETAM 500 MG PO TABS: 500.0000 mg | ORAL_TABLET | Freq: Two times a day (BID) | ORAL | 1 refills | Status: DC

## 2016-06-20 NOTE — Progress Notes (Signed)
Pt. Got d/C orders and follow up appointments.Pt. Is ready to d/c home with his wife.

## 2016-06-20 NOTE — Discharge Instructions (Signed)
Inpatient Rehab Discharge Instructions  [facility] Discharge date and time:  06/20/16  Activities/Precautions/ Functional Status: Activity: activity as tolerated Diet: low fat, low cholesterol diet Wound Care: none needed   Functional status:  ___ No restrictions     ___ Walk up steps independently _X__ 24/7 supervision/assistance   ___ Walk up steps with assistance ___ Intermittent supervision/assistance  ___ Bathe/dress independently ___ Walk with walker     _X__ Bathe/dress with assistance ___ Walk Independently    ___ Shower independently _X__ Walk with assistance    ___ Shower with assistance _X__ No alcohol     ___ Return to work/school ________   Special Instructions:    COMMUNITY REFERRALS UPON DISCHARGE:    Home Health:   PT,OT, [facility] [place]   Date of last service:06/20/2016  Medical Equipment/Items Ordered:NONE HAD FROM PREVIOUS ADMISSIONS    GENERAL COMMUNITY RESOURCES FOR PATIENT/FAMILY: Support Groups:[facility] SUPPORT GROUPS EVERY SECOND Thursday @ 3:00-4:00 PM ON THE REHAB UNIT QUESTIONS CONTACT [place] [phone]  STROKE/TIA DISCHARGE INSTRUCTIONS SMOKING Cigarette smoking nearly doubles your risk of having a stroke & is the single most alterable risk factor  If you smoke or have smoked in the last 12 months, you are advised to quit smoking for your health.  Most of the excess cardiovascular risk related to smoking disappears within a year of stopping.  Ask you doctor about anti-smoking medications  [place] Quit Line: 1-800-QUIT NOW  Free Smoking Cessation Classes [phone]  CHOLESTEROL Know your levels; limit fat & cholesterol in your diet  Lipid Panel     Component Value Date/Time   CHOL 77 03/31/2016 0353   TRIG 37 03/31/2016 0353   HDL 32 (L) 03/31/2016 0353   CHOLHDL 2.4 03/31/2016 0353   VLDL 7 03/31/2016 0353   LDLCALC 38 03/31/2016 0353      Many patients benefit from treatment even if their  cholesterol is at goal.  Goal: Total Cholesterol (CHOL) less than 160  Goal:  Triglycerides (TRIG) less than 150  Goal:  HDL greater than 40  Goal:  LDL (LDLCALC) less than 100   BLOOD PRESSURE American Stroke Association blood pressure target is less that 120/80 mm/Hg  Your discharge blood pressure is:  BP: 105/72  Monitor your blood pressure  Limit your salt and alcohol intake  Many individuals will require more than one medication for high blood pressure  DIABETES (A1c is a blood sugar average for last 3 months) Goal HGBA1c is under 7% (HBGA1c is blood sugar average for last 3 months)  Diabetes: No known diagnosis of diabetes    Lab Results  Component Value Date   HGBA1C 5.0 03/31/2016     Your HGBA1c can be lowered with medications, healthy diet, and exercise.  Check your blood sugar as directed by your physician  Call your physician if you experience unexplained or low blood sugars.  PHYSICAL ACTIVITY/REHABILITATION Goal is 30 minutes at least 4 days per week  Activity: No driving, and Walk with assistance, Therapies: See above Return to work: N/A  Activity decreases your risk of heart attack and stroke and makes your heart stronger.  It helps control your weight and blood pressure; helps you relax and can improve your mood.  Participate in a regular exercise program.  Talk with your doctor about the best form of exercise for you (dancing, walking, swimming, cycling).  DIET/WEIGHT Goal is to maintain a healthy weight  Your discharge diet is: Diet regular Room service appropriate?  Yes; Fluid consistency: Thin liquids Your height is:  Height: 5\' 7"  (170.2 cm) Your current weight is: Weight: 76.2 kg (167 lb 15.9 oz) Your Body Mass Index (BMI) is:  BMI (Calculated): 25.7  Following the type of diet specifically designed for you will help prevent another stroke.  You are at goal weight.   Your goal Body Mass Index (BMI) is 19-24.  Healthy food habits can help reduce  3 risk factors for stroke:  High cholesterol, hypertension, and excess weight.  RESOURCES Stroke/Support Group:  Call [phone]   STROKE EDUCATION PROVIDED/REVIEWED AND GIVEN TO PATIENT Stroke warning signs and symptoms How to activate emergency medical system (call 911). Medications prescribed at discharge. Need for follow-up after discharge. Personal risk factors for stroke. Pneumonia vaccine given:  Flu vaccine given:  My questions have been answered, the writing is legible, and I understand these instructions.  I will adhere to these goals & educational materials that have been provided to me after my discharge from the hospital.     My questions have been answered and I understand these instructions. I will adhere to these goals and the provided educational materials after my discharge from the hospital.  Patient/Caregiver Signature _______________________________ Date __________  Clinician Signature _______________________________________ Date __________  Please bring this form and your medication list with you to all your follow-up doctor's appointments.

## 2016-06-20 NOTE — Progress Notes (Signed)
Subjective/Complaints:  Slept well, no new issues, discussed xray result with pt and wife  ROS: pt denies nausea, vomiting, diarrhea, cough, shortness of breath or chest pain  Objective: Vital Signs: Blood pressure 103/66, pulse 85, temperature 98.1 F (36.7 C), temperature source Oral, resp. rate 17, height 5\' 7"  (1.702 m), weight 76.1 kg (167 lb 11.2 oz), SpO2 94 %. No results found. No results found for this or any previous visit (from the past 72 hour(s)).   HEENT: normal Cardio: IRR/IRR Resp: CTA B GI: +BS, NT,ND Extremity:  No Edema, No calf tenderness Skin:  No skin breakdown Neuro: reduced sensation RUE and RLE to LT, HOH Musc/Skel:  Other Right knee pain and decreased ROM, lateral jt line tenderness, no effusion Gen NAD   Assessment/Plan: 1. 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 Stable for D/C today F/u PCP in 3-4 weeks F/u PM&R 2 weeks See D/C summary See D/C instructions FIM: Function - Bathing Position: Shower Body parts bathed by patient: Right arm, Buttocks, Left lower leg, Back, Right upper leg, Left arm, Chest, Abdomen, Left upper leg, Right lower leg, Front perineal area Assist Level: Supervision or verbal cues  Function- Upper Body Dressing/Undressing What is the patient wearing?: Pull over shirt/dress Pull over shirt/dress - Perfomed by patient: Thread/unthread right sleeve, Thread/unthread left sleeve, Put head through opening, Pull shirt over trunk Assist Level: Set up Set up : To obtain clothing/put away Function - Lower Body Dressing/Undressing What is the patient wearing?: Pants, Non-skid slipper socks, Underwear Position: Other (comment) (Toilet) Underwear - Performed by patient: Thread/unthread right underwear leg, Thread/unthread left underwear leg, Pull underwear up/down Underwear - Performed by helper: Pull underwear up/down Pants- Performed by patient: Thread/unthread right pants leg,  Thread/unthread left pants leg, Pull pants up/down Pants- Performed by helper: Pull pants up/down Non-skid slipper socks- Performed by patient: Don/doff right sock, Don/doff left sock Socks - Performed by patient: Don/doff right sock, Don/doff left sock Shoes - Performed by patient: Don/doff right shoe, Don/doff left shoe Shoes - Performed by helper: Fasten right, Fasten left TED Hose - Performed by helper: Don/doff right TED hose, Don/doff left TED hose Assist for footwear: Setup Assist for lower body dressing: Supervision or verbal cues  Function - Toileting Toileting steps completed by patient: Adjust clothing prior to toileting, Performs perineal hygiene, Adjust clothing after toileting Toileting steps completed by helper: Adjust clothing after toileting Toileting Assistive Devices: Grab bar or rail Assist level: Supervision or verbal cues  Function - Air cabin crew transfer assistive device: Elevated toilet seat/BSC over toilet, Grab bar Assist level to toilet: Supervision or verbal cues Assist level from toilet: Supervision or verbal cues  Function - Chair/bed transfer Chair/bed transfer method: Squat pivot, Stand pivot Chair/bed transfer assist level: Supervision or verbal cues Chair/bed transfer assistive device: Armrests, Walker  Function - Locomotion: Wheelchair Will patient use wheelchair at discharge?: Yes Type: Manual Max wheelchair distance: 167ft Assist Level: Supervision or verbal cues Assist Level: Supervision or verbal cues Assist Level: Supervision or verbal cues Turns around,maneuvers to table,bed, and toilet,negotiates 3% grade,maneuvers on rugs and over doorsills: No Function - Locomotion: Ambulation Ambulation activity did not occur: Safety/medical concerns (orthostatic with minimal standing) Assistive device: Walker-rolling Max distance: 40 ft Assist level: Touching or steadying assistance (Pt > 75%) Walk 10 feet activity did not occur:  Safety/medical concerns Assist level: Touching or steadying assistance (Pt > 75%) Walk 50 feet with 2 turns activity did not occur: Safety/medical concerns Walk 150  feet activity did not occur: Safety/medical concerns Walk 10 feet on uneven surfaces activity did not occur: Safety/medical concerns  Function - Comprehension Comprehension: Auditory Comprehension assistive device: Hearing aids Comprehension assist level: Follows complex conversation/direction with extra time/assistive device  Function - Expression Expression: Verbal Expression assist level: Expresses complex ideas: With extra time/assistive device  Function - Social Interaction Social Interaction assist level: Interacts appropriately with others - No medications needed.  Function - Problem Solving Problem solving assist level: Solves complex problems: With extra time  Function - Memory Memory assist level: More than reasonable amount of time Patient normally able to recall (first 3 days only): Current season, Location of own room, Staff names and faces, That he or she is in a hospital   Medical Problem List and Plan: 1.  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  DVT Prophylaxis/Anticoagulation: Pharmaceutical: Other (comment)--Eliquis, no signs of DVT 3. Pain Management: Continue Voltaren gel for knee pain. Xrays reviewed severe lateral and moderate medial jt space narrowing R knee, consider outpt corticosteroid injection 4. Mood: Wife supportive and is staying to provide psychological support.  5. Neuropsych: This patient is not  capable of making decisions on his own behalf. 6. Skin/Wound Care: Routine pressure relief measures.  7. Fluids/Electrolytes/Nutrition: Monitor I/O. Offer supplements between meals  8. A fib with RVR: Monitor HR  bid--continue Eliquis, metoprolol 9.  Leucocytosis: Continue to monitor for signs of infection.  10. GERD: Managed by Protonix.   12. Seizure prophylaxis: started on Keppra--monitor for tolerance.  13. Diastolic dysfunction: Monitor signs/symptoms of fluid overload. 14. Hyponatremia: improved from 130->133 15. ABLA: Hgb improved from 10.8->11.6->11.8gm 16.  Orthostatic hypotension resolved off flomax , BP still on low side but no dizziness     17. Urinary incontinence: nocturia overall improved off flomax  LOS (Days) 10 A FACE TO FACE EVALUATION WAS PERFORMED  Herbert Marquez 06/20/2016, 7:25 AM

## 2016-06-21 ENCOUNTER — Telehealth: Payer: Self-pay | Admitting: Cardiovascular Disease

## 2016-06-21 ENCOUNTER — Telehealth: Payer: Self-pay | Admitting: Physical Medicine & Rehabilitation

## 2016-06-21 DIAGNOSIS — Z87891 Personal history of nicotine dependence: Secondary | ICD-10-CM | POA: Diagnosis not present

## 2016-06-21 DIAGNOSIS — I2510 Atherosclerotic heart disease of native coronary artery without angina pectoris: Secondary | ICD-10-CM | POA: Diagnosis not present

## 2016-06-21 DIAGNOSIS — F0390 Unspecified dementia without behavioral disturbance: Secondary | ICD-10-CM | POA: Diagnosis not present

## 2016-06-21 DIAGNOSIS — I129 Hypertensive chronic kidney disease with stage 1 through stage 4 chronic kidney disease, or unspecified chronic kidney disease: Secondary | ICD-10-CM | POA: Diagnosis not present

## 2016-06-21 DIAGNOSIS — D649 Anemia, unspecified: Secondary | ICD-10-CM | POA: Diagnosis not present

## 2016-06-21 DIAGNOSIS — E1122 Type 2 diabetes mellitus with diabetic chronic kidney disease: Secondary | ICD-10-CM | POA: Diagnosis not present

## 2016-06-21 DIAGNOSIS — J449 Chronic obstructive pulmonary disease, unspecified: Secondary | ICD-10-CM | POA: Diagnosis not present

## 2016-06-21 DIAGNOSIS — I69351 Hemiplegia and hemiparesis following cerebral infarction affecting right dominant side: Secondary | ICD-10-CM | POA: Diagnosis not present

## 2016-06-21 DIAGNOSIS — N182 Chronic kidney disease, stage 2 (mild): Secondary | ICD-10-CM | POA: Diagnosis not present

## 2016-06-21 DIAGNOSIS — K219 Gastro-esophageal reflux disease without esophagitis: Secondary | ICD-10-CM | POA: Diagnosis not present

## 2016-06-21 DIAGNOSIS — E785 Hyperlipidemia, unspecified: Secondary | ICD-10-CM | POA: Diagnosis not present

## 2016-06-21 DIAGNOSIS — Z8673 Personal history of transient ischemic attack (TIA), and cerebral infarction without residual deficits: Secondary | ICD-10-CM | POA: Diagnosis not present

## 2016-06-21 NOTE — Telephone Encounter (Signed)
Spoke with patient's wife who called to ask about post hospital follow-up. She states the patient is not currently having any heart problems except that his BP has been low on a couple of occasions. We discussed the fact that he will have many upcoming appointments with neurology and rehab medicine and agreed that a June appointment would be appropriate. Patient is scheduled for June 18 and wife agrees to call back if sooner appointment is needed. She thanked me for the call.

## 2016-06-21 NOTE — Telephone Encounter (Signed)
[place] with [facility] wanted to let us know that patient is declining ST at this time.  Any questions please call Melanie at [phone].

## 2016-06-21 NOTE — Telephone Encounter (Signed)
Sonia Baller was the ST with [facility] that called about this patient.  Her number is [phone].

## 2016-06-21 NOTE — Telephone Encounter (Signed)
Patient wife calling, would like to know if patient needs to come in for a followup after his hospital stay. Please call to discuss,thanks.

## 2016-06-22 ENCOUNTER — Telehealth: Payer: Self-pay | Admitting: Physical Medicine & Rehabilitation

## 2016-06-22 NOTE — Telephone Encounter (Signed)
Approval given for POC. 

## 2016-06-22 NOTE — Telephone Encounter (Signed)
Herbert Marquez PT with [facility] needs verbal for 2w4.  Patient also refused OT.  Please cal Herbert Marquez with verbal.

## 2016-06-26 ENCOUNTER — Telehealth: Payer: Self-pay | Admitting: Adult Health

## 2016-06-26 DIAGNOSIS — E1122 Type 2 diabetes mellitus with diabetic chronic kidney disease: Secondary | ICD-10-CM | POA: Diagnosis not present

## 2016-06-26 DIAGNOSIS — I129 Hypertensive chronic kidney disease with stage 1 through stage 4 chronic kidney disease, or unspecified chronic kidney disease: Secondary | ICD-10-CM | POA: Diagnosis not present

## 2016-06-26 DIAGNOSIS — N182 Chronic kidney disease, stage 2 (mild): Secondary | ICD-10-CM | POA: Diagnosis not present

## 2016-06-26 DIAGNOSIS — I2510 Atherosclerotic heart disease of native coronary artery without angina pectoris: Secondary | ICD-10-CM | POA: Diagnosis not present

## 2016-06-26 DIAGNOSIS — J449 Chronic obstructive pulmonary disease, unspecified: Secondary | ICD-10-CM | POA: Diagnosis not present

## 2016-06-26 DIAGNOSIS — I69351 Hemiplegia and hemiparesis following cerebral infarction affecting right dominant side: Secondary | ICD-10-CM | POA: Diagnosis not present

## 2016-06-26 NOTE — Telephone Encounter (Signed)
Please advise.  T. Nelson, CMA 

## 2016-06-26 NOTE — Telephone Encounter (Signed)
Pt's spouse informed of how to resume medication.  Spouse expressed understanding.  Charyl Bigger, CMA

## 2016-06-26 NOTE — Telephone Encounter (Signed)
Home Healthcare nurse called states patient is back home from Hospital stay--He was taken off citalopram/ Celex 10 MG while on antibiotics and wants to know if he should resume taking them since completing dosage of Antibiotics-- pls advise--- med records show "historical provider " not Dr"O" nor Lillard Anes as prescriber. --glh

## 2016-06-26 NOTE — Telephone Encounter (Signed)
Med started 03/20/16 by Valetta Fuller for "intense early am anxiety."  I recommend he restart the med, at 1/2 tab for a week, then 1 full tab as prescribed by his PCP.  F/up with Valetta Fuller mid May as she instructed.

## 2016-06-26 NOTE — Telephone Encounter (Signed)
Pt's [facility] nurse states patient wants to know if he should start back taking citalopram/Celex 10 MG (take 1 daily)-- Pt states was taken off due to antibiotics he was place on-- Please advise / call patient at home now out of the hospital. --glh

## 2016-06-28 DIAGNOSIS — I129 Hypertensive chronic kidney disease with stage 1 through stage 4 chronic kidney disease, or unspecified chronic kidney disease: Secondary | ICD-10-CM | POA: Diagnosis not present

## 2016-06-28 DIAGNOSIS — E1122 Type 2 diabetes mellitus with diabetic chronic kidney disease: Secondary | ICD-10-CM | POA: Diagnosis not present

## 2016-06-28 DIAGNOSIS — I2510 Atherosclerotic heart disease of native coronary artery without angina pectoris: Secondary | ICD-10-CM | POA: Diagnosis not present

## 2016-06-28 DIAGNOSIS — I69351 Hemiplegia and hemiparesis following cerebral infarction affecting right dominant side: Secondary | ICD-10-CM | POA: Diagnosis not present

## 2016-06-28 DIAGNOSIS — N182 Chronic kidney disease, stage 2 (mild): Secondary | ICD-10-CM | POA: Diagnosis not present

## 2016-06-28 DIAGNOSIS — J449 Chronic obstructive pulmonary disease, unspecified: Secondary | ICD-10-CM | POA: Diagnosis not present

## 2016-06-29 ENCOUNTER — Encounter: Payer: Medicare Other | Attending: Physical Medicine & Rehabilitation

## 2016-06-29 ENCOUNTER — Encounter: Payer: Self-pay | Admitting: Physical Medicine & Rehabilitation

## 2016-06-29 ENCOUNTER — Ambulatory Visit (HOSPITAL_BASED_OUTPATIENT_CLINIC_OR_DEPARTMENT_OTHER): Payer: Medicare Other | Admitting: Physical Medicine & Rehabilitation

## 2016-06-29 VITALS — BP 130/79 | HR 68

## 2016-06-29 DIAGNOSIS — I639 Cerebral infarction, unspecified: Secondary | ICD-10-CM

## 2016-06-29 DIAGNOSIS — I69998 Other sequelae following unspecified cerebrovascular disease: Secondary | ICD-10-CM | POA: Diagnosis not present

## 2016-06-29 DIAGNOSIS — F0151 Vascular dementia with behavioral disturbance: Secondary | ICD-10-CM | POA: Diagnosis not present

## 2016-06-29 DIAGNOSIS — I69359 Hemiplegia and hemiparesis following cerebral infarction affecting unspecified side: Secondary | ICD-10-CM

## 2016-06-29 DIAGNOSIS — R269 Unspecified abnormalities of gait and mobility: Secondary | ICD-10-CM

## 2016-06-29 DIAGNOSIS — I69398 Other sequelae of cerebral infarction: Secondary | ICD-10-CM | POA: Diagnosis not present

## 2016-06-29 DIAGNOSIS — I69898 Other sequelae of other cerebrovascular disease: Secondary | ICD-10-CM | POA: Diagnosis present

## 2016-06-29 DIAGNOSIS — G8191 Hemiplegia, unspecified affecting right dominant side: Secondary | ICD-10-CM

## 2016-06-29 DIAGNOSIS — F01518 Vascular dementia, unspecified severity, with other behavioral disturbance: Secondary | ICD-10-CM

## 2016-06-29 HISTORY — DX: Vascular dementia with behavioral disturbance: F01.51

## 2016-06-29 HISTORY — DX: Other sequelae of cerebral infarction: I69.398

## 2016-06-29 HISTORY — DX: Hemiplegia and hemiparesis following cerebral infarction affecting unspecified side: I69.359

## 2016-06-29 HISTORY — DX: Vascular dementia, unspecified severity, with other behavioral disturbance: F01.518

## 2016-06-29 MED ORDER — QUETIAPINE FUMARATE 25 MG PO TABS: 25.0000 mg | ORAL_TABLET | Freq: Every day | ORAL | 0 refills | Status: DC

## 2016-06-29 NOTE — Patient Instructions (Signed)
May use seroquel at night when agitated

## 2016-06-29 NOTE — Progress Notes (Signed)
Subjective:    Patient ID: Herbert Marquez, male    DOB: Feb 20, 1928, 83 y.o.   MRN: [id number] Admit date: 06/10/2016 Discharge date: 06/20/2016 83 year old right-handed male, history of hypertension, tachycardia with PVCs, diabetes mellitus, CAD with stenting.  Lives with spouse independent with a cane.  Presented on June 16, 2015, with syncope, dizziness, no seizure activity noted, developed right-sided weakness and facial droop in the ED.  Cranial CT scan negative.  The patient did not receive tPA.  Troponin negative. MRI of the brain showed a left thalamic infarction, chronic small vessel disease.  MRA of the head, occluded left posterior cerebral artery high- grade stenosis, right P2 segment.  MRA of neck with no high-grade stenosis.  CTA of neck probable to moderate high-grade stenosis, left internal carotid artery.  The EEG consistent with mild generalized nonspecific cerebral dysfunction, encephalopathy, no seizure activity. Echocardiogram ejection fraction 60%.  No wall motion abnormalities.  HPI Episodes of confusion since trying  Citalopram 2 nights in a row.  Pt's wife states pt was combative and pt needed to leave the bedroom. Had been home approximately 1 week prior to this time. Sleeping well Had episode of dysarthria that resolved spontaneously in the morning.  Sees PCP on Monday Some breathing issues when agitated No urinary freq or dysuria No falls  Dressing assist for pants, mainly because it takes him a long time to do this. He can actually do this by himself. According to patient and his wife. Gets in shower with sup Mainly WC bound, can ambulate with a walker with therapy only Pain Inventory Average Pain 4 Pain Right Now 0 My pain is intermittent, dull and aching  In the last 24 hours, has pain interfered with the following? General activity 2 Relation with others 1 Enjoyment of life 2 What TIME of day is your pain at its worst? night Sleep (in general)  Poor  Pain is worse with: walking, standing and some activites Pain improves with: rest and therapy/exercise Relief from Meds: 6  Mobility walk with assistance use a walker ability to climb steps?  no do you drive?  no  Function retired  Neuro/Psych bladder control problems weakness trouble walking dizziness depression  Prior Studies Any changes since last visit?  no CLINICAL DATA:  Altered and agitated at urologist office today. Confusion. History of hypertension, hyperlipidemia, diabetes and stroke.  EXAM: MRI HEAD WITHOUT CONTRAST  MRA HEAD WITHOUT CONTRAST  TECHNIQUE: Multiplanar, multiecho pulse sequences of the brain and surrounding structures were obtained without intravenous contrast. Angiographic images of the head were obtained using MRA technique without contrast.  COMPARISON:  CT HEAD June 04, 2016 and MRI of the head June 04, 2016 in CTA HEAD March 31, 2016  FINDINGS: MRI HEAD FINDINGS  BRAIN: 4 mm focus of reduced diffusion RIGHT hippocampus. Due to small size, limited assessment on ADC map. Susceptibility artifact LEFT temporal lobe at site of prior intraparenchymal hematoma. Ventricles and sulci are normal for patient's age. Confluent supratentorial white matter FLAIR T2 hyperintensities. Old LEFT thalamus lacunar infarct. No abnormal extra-axial fluid collections. 7 mm pineal cyst.  VASCULAR: Normal major intracranial vascular flow voids present at skull base.  SKULL AND UPPER CERVICAL SPINE: No abnormal sellar expansion. No suspicious calvarial bone marrow signal. Craniocervical junction maintained.  SINUSES/ORBITS: The mastoid air-cells and included paranasal sinuses are well-aerated. The included ocular globes and orbital contents are non-suspicious. Status post bilateral ocular lens implants.  OTHER: None.  MRA HEAD FINDINGS  ANTERIOR CIRCULATION:  Flow related enhancement of the included cervical, petrous,  cavernous and supraclinoid internal carotid arteries. RIGHT tonsillar loop. Mild stenosis bilateral supraclinoid internal carotid artery's. Patent anterior communicating artery. Normal flow related enhancement of the anterior and middle cerebral arteries, including distal segments.  No large vessel occlusion, high-grade stenosis, abnormal luminal irregularity, aneurysm.  POSTERIOR CIRCULATION: LEFT vertebral artery is dominant. Basilar artery is patent, with normal flow related enhancement of the main branch vessels. Normal flow related enhancement of the posterior cerebral arteries.  No large vessel occlusion, high-grade stenosis, abnormal luminal irregularity, aneurysm.  ANATOMIC VARIANTS: Hypoplastic LEFT A1 segment.  IMPRESSION: MRI HEAD: 4 mm RIGHT hippocampal infarct is likely acute.  Old LEFT temporal lobe hemorrhage.  Moderate chronic small vessel ischemic disease. Old LEFT thalamus lacunar infarct.  MRA HEAD: No emergent large vessel occlusion or severe stenosis.   Electronically Signed   By: Elon Alas M.D.   On: 06/07/2016 20:47 Physicians involved in your care Any changes since last visit?  no   Family History  Problem Relation Age of Onset  . Hypertension Mother   . Lung cancer Father   . Lung cancer Brother    Social History   Social History  . Marital status: Married    Spouse name: N/A  . Number of children: N/A  . Years of education: N/A   Occupational History  . Retired Other   Social History Main Topics  . Smoking status: Former Smoker    Quit date: 03/06/1971  . Smokeless tobacco: Never Used  . Alcohol use 4.8 oz/week    1 Cans of beer, 7 Glasses of wine per week     Comment: 1/2 BEER AND 1 WINE  . Drug use: No  . Sexual activity: No   Other Topics Concern  . Not on file   Social History Narrative   Lives in [place], [place] with wife. Has 3 children.    Past Surgical History:  Procedure Laterality Date  .  CARDIOVASCULAR STRESS TEST  07/01/2007   EF 74%  . CATARACT EXTRACTION, BILATERAL    . CORONARY ANGIOPLASTY WITH STENT PLACEMENT  10/2004   stenting x 2 to RCA  . FEMUR IM NAIL Right 11/26/2015   Procedure: INTRAMEDULLARY RIGHT (IM) NAIL FEMORAL;  Surgeon: Rod Can, MD;  Location: [facility];  Service: Orthopedics;  Laterality: Right;  . HERNIA REPAIR    . HIP ARTHROPLASTY  03/09/2011   Procedure: ARTHROPLASTY BIPOLAR HIP;  Surgeon: Mauri Pole;  Location: [facility];  Service: Orthopedics;  Laterality: Left;  . LAPAROSCOPIC INCISIONAL / UMBILICAL / VENTRAL HERNIA REPAIR     "below his naval"   Past Medical History:  Diagnosis Date  . Arthritis    "pretty much all over"   . Basal cell carcinoma    "several burned off his body, face, head"  . BPH (benign prostatic hypertrophy)   . Coronary artery disease    a. s/p PCI of RCA in 2006  . CVA (cerebral infarction)    a. 06/2015: left thalamic and bilateral PCA  . GERD (gastroesophageal reflux disease)   . Hyperlipidemia   . Hypertension   . TIA (transient ischemic attack)    Approximately 6 weeks post-cardiac catheterization.   . Type II diabetes mellitus ([place])    "prediabetic; lost alot of weight; not diabetic now" (06/16/2015)   There were no vitals taken for this visit.  Opioid Risk Score:   Fall Risk Score:  `1  Depression screen [facility] 2/9  Depression screen [facility] 2/9 03/20/2016 08/18/2015 07/21/2015  Decreased Interest 1 0 0  Down, Depressed, Hopeless 1 0 1  PHQ - 2 Score 2 0 1  Altered sleeping 3 - 0  Tired, decreased energy 0 - 2  Change in appetite 0 - 0  Feeling bad or failure about yourself  1 - 1  Trouble concentrating 0 - 0  Moving slowly or fidgety/restless 0 - 1  Suicidal thoughts 0 - 0  PHQ-9 Score 6 - 5  Difficult doing work/chores Somewhat difficult - Somewhat difficult    Review of Systems  Constitutional: Negative.   HENT: Negative.   Eyes: Negative.   Respiratory: Negative.   Cardiovascular: Negative.     Gastrointestinal: Negative.   Endocrine: Negative.   Genitourinary: Negative.   Skin: Negative.   Allergic/Immunologic: Negative.   Neurological: Negative.   Hematological: Bruises/bleeds easily.  Psychiatric/Behavioral: Positive for confusion.       Spouse states that she believes it is patients medication (keppra) that is causing this issue  All other systems reviewed and are negative.      Objective:   Physical Exam  Constitutional: He is oriented to person, place, and time. He appears well-developed and well-nourished.  HENT:  Head: Normocephalic and atraumatic.  Eyes: Conjunctivae and EOM are normal. Pupils are equal, round, and reactive to light.  Neck: Normal range of motion.  Cardiovascular: Normal rate, regular rhythm and normal heart sounds.   No murmur heard. Pulmonary/Chest: Effort normal and breath sounds normal. No respiratory distress. He has no wheezes.  Abdominal: Soft. Bowel sounds are normal. He exhibits no distension. There is no tenderness.  Neurological: He is alert and oriented to person, place, and time.  Psychiatric: He has a normal mood and affect. His speech is normal and behavior is normal. Judgment and thought content normal. Cognition and memory are impaired.  Nursing note and vitals reviewed.  Motor strength is 4/5 in the right deltoid, biceps, triceps, grip, right hip flexor, knee extensor, ankle dorsiflexor 5/5 in the left deltoid, biceps, triceps, grip, hip flexor, knee extensor, ankle dorsi flexors. There is ataxia on finger-nose-finger testing in the right upper extremity as well as ataxia with heel-to-shin testing in the right lower extremity       Assessment & Plan:  1.  History of recent right hippocampal infarct, this is superimposed on a prior large left thalamic infarct as well as MRI evidence of prior intracranial hemorrhage noted . Left temporal lobe. Based on these multiple brain insults, as well as symptoms of sundowning and  decreased memory, vascular dementia is likely diagnosis.  I'm questioning whether he actually had a seizure, his wife described the seizure that was noted around the last acute hospitalization. This sounded more like an episode of agitation, Has follow-up with neurology May 23  At this point the biggest issue is agitation at night, which has been very stressful for his wife. She does know what to do if he gets agitated. I have advised her not to give him the citalopram, even though it is an unusual side effect. Have given her a prescription for seroquel 25 mg daily at bedtime in the event he has problems with some agitation at night, even off the citalopram  He may benefit from a neuropsychology evaluation. We'll discuss at next visit  PCP follow-up on Monday

## 2016-07-02 ENCOUNTER — Encounter: Payer: Self-pay | Admitting: Adult Health

## 2016-07-02 ENCOUNTER — Ambulatory Visit (INDEPENDENT_AMBULATORY_CARE_PROVIDER_SITE_OTHER): Payer: Medicare Other | Admitting: Adult Health

## 2016-07-02 VITALS — BP 144/80 | HR 69

## 2016-07-02 DIAGNOSIS — Z298 Encounter for other specified prophylactic measures: Secondary | ICD-10-CM

## 2016-07-02 DIAGNOSIS — I639 Cerebral infarction, unspecified: Secondary | ICD-10-CM | POA: Diagnosis not present

## 2016-07-02 DIAGNOSIS — E118 Type 2 diabetes mellitus with unspecified complications: Secondary | ICD-10-CM

## 2016-07-02 DIAGNOSIS — R451 Restlessness and agitation: Secondary | ICD-10-CM | POA: Insufficient documentation

## 2016-07-02 DIAGNOSIS — Z2989 Encounter for other specified prophylactic measures: Secondary | ICD-10-CM

## 2016-07-02 DIAGNOSIS — I480 Paroxysmal atrial fibrillation: Secondary | ICD-10-CM

## 2016-07-02 LAB — POCT UA - MICROALBUMIN
Creatinine, POC: 200 mg/dL
Microalbumin Ur, POC: 150 mg/L

## 2016-07-02 LAB — POCT GLYCOSYLATED HEMOGLOBIN (HGB A1C): Hemoglobin A1C: 5.5

## 2016-07-02 NOTE — Assessment & Plan Note (Signed)
Occurs HS and early am. Sx's worsened when taking low dose cymbalta- medication D/Cd. Started on Quetiapine 25mg  QHS PRN-wife reports he has slept well the last 3 nights with this medication.

## 2016-07-02 NOTE — Progress Notes (Signed)
Subjective:    Patient ID: Herbert Marquez, male    DOB: 1927-11-24, 83 y.o.   MRN: [id number]  HPI:  Herbert Marquez is here for hospital f/u; [place] 06/04/2016, D/C 06/10/2016 for acute encephalopathy, acute CVA, possible seizure, paroxysmal A fib with tachycardia.   He underwent in facility PT/rehab 06/10/2016-06/20/2016. Phys Med is supervising his home rehab and PT works with him twice each week.  He reports still feeling weak, and primarily uses wheelchair for all mobility.  He reports excellent appetite and denies any medication SE.   He has seen by Phys Med 06/30/2016-Cymbalta was causing increased agitation/restlessness and med was d/c'd.  He was started on Quetiapine 25mg  QHS PRN and his wife reports "best sleep in months" the last 3 nights.  He was able to state where he is and who was the current president is, however struggled with current month and year. He denies CP/dyspnea/palpitations/fever.  He continues to experience nocturia (urinating 1-3 times/nightly) and his wife is "planning on calling Urologist for appt soon".   Patient Care Team    Relationship Specialty Notifications Start End  Odella Aquas, NP PCP - General Family Medicine  03/20/16   Thayer Headings, MD Consulting Physician Cardiology  03/20/16   Rod Can, MD Consulting Physician Orthopedic Surgery  03/20/16   Jarome Matin, MD Consulting Physician Dermatology  03/20/16   Nickie Retort, MD Consulting Physician Urology  03/20/16   Shon Hough, MD Consulting Physician Ophthalmology  03/20/16     Patient Active Problem List   Diagnosis Date Noted  . Restlessness and agitation 07/02/2016  . Vascular dementia with behavior disturbance 06/29/2016  . Hemiparesis and other late effects of cerebrovascular accident ([place]) 06/29/2016  . Embolic stroke ([place]) 46/50/3546  . Right hemiparesis ([place])   . History of CVA with residual deficit   . Chronic anticoagulation   . Atrial fibrillation with rapid ventricular response ([place])    . Seizure prophylaxis   . Diastolic dysfunction   . Bacteremia due to Gram-positive bacteria   . Altered mental status   . Dementia without behavioral disturbance   . Leukocytosis   . Acute blood loss anemia   . Acute encephalopathy 06/04/2016  . PAF (paroxysmal atrial fibrillation) ([place])   . Iron deficiency anemia 04/04/2016  . History of CVA (cerebrovascular accident) 04/04/2016  . Cerebral hemorrhage (HCC) w/ SDH s/p IV tPA   . Coronary artery disease involving native coronary artery of native heart without angina pectoris   . Orthostatic hypotension   . History of right hip replacement   . Acute ischemic stroke ([place]) - L temporal lobe s/p tPA 03/30/2016  . BPH (benign prostatic hyperplasia) 11/25/2015  . GERD (gastroesophageal reflux disease) 11/25/2015  . CKD (chronic kidney disease), stage II 11/25/2015  . Overactive bladder   . Cerebrovascular accident (CVA) ([place]) 09/18/2015  . Gait disturbance, post-stroke 06/23/2015  . 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 Thalamic infarction ([place]) 06/21/2015  . Benign essential HTN   . Type 2 diabetes mellitus with complication, without long-term current use of insulin ([place])   . Dysphagia as late effect of cerebrovascular disease   . Hyponatremia   . HLD (hyperlipidemia)   . Obesity 03/09/2011  . COPD (chronic obstructive pulmonary disease) ([place]) 03/09/2011     Past Medical History:  Diagnosis Date  . Arthritis    "pretty much all over"   . Basal cell carcinoma    "several burned off his body, face, head"  .  BPH (benign prostatic hypertrophy)   . Coronary artery disease    a. s/p PCI of RCA in 2006  . CVA (cerebral infarction)    a. 06/2015: left thalamic and bilateral PCA  . GERD (gastroesophageal reflux disease)   . Hyperlipidemia   . Hypertension   . TIA (transient ischemic attack)    Approximately 6 weeks post-cardiac catheterization.   . Type II diabetes mellitus ([place])    "prediabetic; lost alot of weight; not  diabetic now" (06/16/2015)     Past Surgical History:  Procedure Laterality Date  . CARDIOVASCULAR STRESS TEST  07/01/2007   EF 74%  . CATARACT EXTRACTION, BILATERAL    . CORONARY ANGIOPLASTY WITH STENT PLACEMENT  10/2004   stenting x 2 to RCA  . FEMUR IM NAIL Right 11/26/2015   Procedure: INTRAMEDULLARY RIGHT (IM) NAIL FEMORAL;  Surgeon: Rod Can, MD;  Location: [facility];  Service: Orthopedics;  Laterality: Right;  . HERNIA REPAIR    . HIP ARTHROPLASTY  03/09/2011   Procedure: ARTHROPLASTY BIPOLAR HIP;  Surgeon: Mauri Pole;  Location: [facility];  Service: Orthopedics;  Laterality: Left;  . LAPAROSCOPIC INCISIONAL / UMBILICAL / VENTRAL HERNIA REPAIR     "below his naval"     Family History  Problem Relation Age of Onset  . Hypertension Mother   . Lung cancer Father   . Lung cancer Brother      History  Drug Use No     History  Alcohol Use  . 4.8 oz/week  . 1 Cans of beer, 7 Glasses of wine per week    Comment: 1/2 BEER AND 1 WINE     History  Smoking Status  . Former Smoker  . Quit date: 03/06/1971  Smokeless Tobacco  . Never Used     Outpatient Encounter Prescriptions as of 07/02/2016  Medication Sig  . apixaban (ELIQUIS) 5 MG TABS tablet Take 1 tablet (5 mg total) by mouth 2 (two) times daily.  [name] atorvastatin (LIPITOR) 80 MG tablet Take 1 tablet (80 mg total) by mouth daily. (Patient taking differently: Take 80 mg by mouth at bedtime. )  . diclofenac sodium (VOLTAREN) 1 % GEL Apply 2 g topically 4 (four) times daily.  [name] levETIRAcetam (KEPPRA) 500 MG tablet Take 1 tablet (500 mg total) by mouth 2 (two) times daily.  . Multiple Vitamin (MULTIVITAMIN WITH MINERALS) TABS tablet Take 1 tablet by mouth daily.  . nitroGLYCERIN (NITROSTAT) 0.4 MG SL tablet Place 0.4 mg under the tongue every 5 (five) minutes as needed for chest pain.  [name] omeprazole (PRILOSEC) 20 MG capsule Take 20 mg by mouth daily.  . polyvinyl alcohol (ARTIFICIAL TEARS) 1.4 % ophthalmic solution  Place 1 drop into both eyes daily as needed for dry eyes.  [name] QUEtiapine (SEROQUEL) 25 MG tablet Take 1 tablet (25 mg total) by mouth at bedtime.   No facility-administered encounter medications on file as of 07/02/2016.     Allergies: Patient has no known allergies.  There is no height or weight on file to calculate BMI.  Blood pressure (!) 144/80, pulse 69, SpO2 98 %.    Review of Systems  Constitutional: Positive for activity change and fatigue. Negative for appetite change, chills, diaphoresis, fever and unexpected weight change.  Eyes: Negative for visual disturbance.  Respiratory: Negative for cough, chest tightness, shortness of breath, wheezing and stridor.   Cardiovascular: Negative for chest pain, palpitations and leg swelling.  Gastrointestinal: Negative for abdominal distention, abdominal pain, blood in stool,  constipation, diarrhea, nausea and vomiting.  Endocrine: Negative for cold intolerance, heat intolerance, polydipsia, polyphagia and polyuria.  Genitourinary: Positive for frequency. Negative for difficulty urinating, flank pain and hematuria.  Musculoskeletal: Positive for arthralgias, back pain, gait problem, joint swelling, myalgias, neck pain and neck stiffness.  Skin: Negative for color change, pallor, rash and wound.  Neurological: Negative for dizziness, tremors, seizures, syncope, speech difficulty, weakness, light-headedness and headaches.  Hematological: Bruises/bleeds easily.  Psychiatric/Behavioral: Positive for confusion. Negative for agitation, behavioral problems, decreased concentration, dysphoric mood, hallucinations, self-injury, sleep disturbance and suicidal ideas. The patient is not nervous/anxious and is not hyperactive.        Objective:   Physical Exam  Constitutional: He appears well-developed and well-nourished.  Eyes: Conjunctivae are normal. Pupils are equal, round, and reactive to light.  Cardiovascular: Normal rate, regular rhythm,  normal heart sounds and intact distal pulses.   No murmur heard. Pulmonary/Chest: Effort normal and breath sounds normal. No respiratory distress. He has no wheezes. He has no rales. He exhibits no tenderness.  Musculoskeletal: He exhibits edema and tenderness.       Right knee: He exhibits decreased range of motion and swelling.  Motor strength 4/5 in the R deltoid/R biceps/R triceps, grip. 5/5 in L deltoid/R Biceps/R triceps. R grip strenth < L grip strength. Ataxia on finger-nose-finger testing in the right upper extremity.  Lymphadenopathy:    He has no cervical adenopathy.  Neurological: He is alert. He displays no seizure activity. Gait abnormal.  Skin: Skin is warm and dry. No rash noted. No erythema. No pallor.  Psychiatric: He has a normal mood and affect. His speech is normal and behavior is normal. Judgment and thought content normal. Cognition and memory are impaired. He exhibits abnormal recent memory. He exhibits normal remote memory.          Assessment & Plan:   1. Type 2 diabetes mellitus with complication, without long-term current use of insulin (HCC)   2. Seizure prophylaxis   3. PAF (paroxysmal atrial fibrillation) ([place])   4. Cerebrovascular accident (CVA), unspecified mechanism ([place])   5. Restlessness and agitation     Seizure prophylaxis Continue Levetiracetam 500mg  BID and has Neurology f/u 07/25/2016.  PAF (paroxysmal atrial fibrillation) (HCC) Continue apixaban 5 mg BID.  Has cards f/u June 2018.  Cerebrovascular accident (CVA) ([place]) Has Neuro f/u 07/25/2016.  Restlessness and agitation Occurs HS and early am. Sx's worsened when taking low dose cymbalta- medication D/Cd. Started on Quetiapine 25mg  QHS PRN-wife reports he has slept well the last 3 nights with this medication.    FOLLOW-UP:  Return in about 3 months (around 10/01/2016) for Regular Follow Up.

## 2016-07-02 NOTE — Patient Instructions (Addendum)
Preventing Cerebrovascular Disease Arteries are blood vessels that carry blood that contains oxygen from the heart to all parts of the body. Cerebrovascular disease affects arteries that supply the brain. Any condition that blocks or disrupts blood flow to the brain can cause cerebrovascular disease. Brain cells that lose blood supply start to die within minutes (stroke). Stroke is the main danger of cerebrovascular disease. Atherosclerosis and high blood pressure are common causes of cerebrovascular disease. Atherosclerosis is narrowing and hardening of an artery that results when fat, cholesterol, calcium, or other substances (plaque) build up inside an artery. Plaque reduces blood flow through the artery. High blood pressure increases the risk of bleeding inside the brain. Making diet and lifestyle changes to prevent atherosclerosis and high blood pressure lowers your risk of cerebrovascular disease. What nutrition changes can be made?  Eat more fruits, vegetables, and whole grains.  Reduce how much saturated fat you eat. To do this, eat less red meat and fewer full-fat dairy products.  Eat healthy proteins instead of red meat. Healthy proteins include:  Fish. Eat fish that contains heart-healthy omega-3 fatty acids, twice a week. Examples include salmon, albacore tuna, mackerel, and herring.  Chicken.  Nuts.  Low-fat or nonfat yogurt.  Avoid processed meats, like bacon and lunchmeat.  Avoid foods that contain:  A lot of sugar, such as sweets and drinks with added sugar.  A lot of salt (sodium). Avoid adding extra salt to your food, as told by your health care provider.  Trans fats, such as margarine and baked goods. Trans fats may be listed as "partially hydrogenated oils" on food labels.  Check food labels to see how much sodium, sugar, and trans fats are in foods.  Use vegetable oils that contain low amounts of saturated fat, such as olive oil or canola oil. What lifestyle  changes can be made?  Drink alcohol in moderation. This means no more than 1 drink a day for nonpregnant women and 2 drinks a day for men. One drink equals 12 oz of beer, 5 oz of wine, or 1 oz of hard liquor.  If you are overweight, ask your health care provider to recommend a weight-loss plan for you. Losing 5-10 lb (2.2-4.5 kg) can reduce your risk of diabetes, atherosclerosis, and high blood pressure.  Exercise for 30?60 minutes on most days, or as much as told by your health care provider.  Do moderate-intensity exercise, such as brisk walking, bicycling, and water aerobics. Ask your health care provider which activities are safe for you.  Do not use any products that contain nicotine or tobacco, such as cigarettes and e-cigarettes. If you need help quitting, ask your health care provider. Why are these changes important? Making these changes lowers your risk of many diseases that can cause cerebrovascular disease and stroke. Stroke is a leading cause of death and disability. Making these changes also improves your overall health and quality of life. What can I do to lower my risk? The following factors make you more likely to develop cerebrovascular disease:  Being overweight.  Smoking.  Being physically inactive.  Eating a high-fat diet.  Having certain health conditions, such as:  Diabetes.  High blood pressure.  Heart disease.  Atherosclerosis.  High cholesterol.  Sickle cell disease. Talk with your health care provider about your risk for cerebrovascular disease. Work with your health care provider to control diseases that you have that may contribute to cerebrovascular disease. Your health care provider may prescribe medicines to help prevent major   causes of cerebrovascular disease. Where to find more information: Learn more about preventing cerebrovascular disease from:  [place], Lung, and [place]:  MoAnalyst.de  Centers for Disease Control and Prevention: http://www.curry-wood.biz/ Summary  Cerebrovascular disease can lead to a stroke.  Atherosclerosis and high blood pressure are major causes of cerebrovascular disease.  Making diet and lifestyle changes can reduce your risk of cerebrovascular disease.  Work with your health care provider to get your risk factors under control to reduce your risk of cerebrovascular disease. This information is not intended to replace advice given to you by your health care provider. Make sure you discuss any questions you have with your health care provider. Document Released: 03/06/2015 Document Revised: 09/09/2015 Document Reviewed: 03/06/2015 Elsevier Interactive Patient Education  2017 [place] all medications as directed. Continue PT and follow-up with specialists as directed. Please follow-up in 3 months, sooner if needed.

## 2016-07-02 NOTE — Assessment & Plan Note (Signed)
Continue apixaban 5 mg BID.  Has cards f/u June 2018.

## 2016-07-02 NOTE — Assessment & Plan Note (Signed)
Continue Levetiracetam 500mg  BID and has Neurology f/u 07/25/2016.

## 2016-07-02 NOTE — Assessment & Plan Note (Signed)
Has Neuro f/u 07/25/2016.

## 2016-07-03 DIAGNOSIS — J449 Chronic obstructive pulmonary disease, unspecified: Secondary | ICD-10-CM | POA: Diagnosis not present

## 2016-07-03 DIAGNOSIS — N182 Chronic kidney disease, stage 2 (mild): Secondary | ICD-10-CM | POA: Diagnosis not present

## 2016-07-03 DIAGNOSIS — I129 Hypertensive chronic kidney disease with stage 1 through stage 4 chronic kidney disease, or unspecified chronic kidney disease: Secondary | ICD-10-CM | POA: Diagnosis not present

## 2016-07-03 DIAGNOSIS — I2510 Atherosclerotic heart disease of native coronary artery without angina pectoris: Secondary | ICD-10-CM | POA: Diagnosis not present

## 2016-07-03 DIAGNOSIS — I69351 Hemiplegia and hemiparesis following cerebral infarction affecting right dominant side: Secondary | ICD-10-CM | POA: Diagnosis not present

## 2016-07-03 DIAGNOSIS — E1122 Type 2 diabetes mellitus with diabetic chronic kidney disease: Secondary | ICD-10-CM | POA: Diagnosis not present

## 2016-07-04 ENCOUNTER — Other Ambulatory Visit: Payer: Self-pay

## 2016-07-04 NOTE — Patient Outreach (Signed)
Telephone outreach to patient to obtain mRS was successfully completed. mRS = 3 

## 2016-07-05 DIAGNOSIS — I2510 Atherosclerotic heart disease of native coronary artery without angina pectoris: Secondary | ICD-10-CM | POA: Diagnosis not present

## 2016-07-05 DIAGNOSIS — E1122 Type 2 diabetes mellitus with diabetic chronic kidney disease: Secondary | ICD-10-CM | POA: Diagnosis not present

## 2016-07-05 DIAGNOSIS — I69351 Hemiplegia and hemiparesis following cerebral infarction affecting right dominant side: Secondary | ICD-10-CM | POA: Diagnosis not present

## 2016-07-05 DIAGNOSIS — N182 Chronic kidney disease, stage 2 (mild): Secondary | ICD-10-CM | POA: Diagnosis not present

## 2016-07-05 DIAGNOSIS — J449 Chronic obstructive pulmonary disease, unspecified: Secondary | ICD-10-CM | POA: Diagnosis not present

## 2016-07-05 DIAGNOSIS — I129 Hypertensive chronic kidney disease with stage 1 through stage 4 chronic kidney disease, or unspecified chronic kidney disease: Secondary | ICD-10-CM | POA: Diagnosis not present

## 2016-07-09 DIAGNOSIS — Z8673 Personal history of transient ischemic attack (TIA), and cerebral infarction without residual deficits: Secondary | ICD-10-CM | POA: Diagnosis not present

## 2016-07-09 DIAGNOSIS — D649 Anemia, unspecified: Secondary | ICD-10-CM | POA: Diagnosis not present

## 2016-07-09 DIAGNOSIS — E785 Hyperlipidemia, unspecified: Secondary | ICD-10-CM | POA: Diagnosis not present

## 2016-07-09 DIAGNOSIS — I129 Hypertensive chronic kidney disease with stage 1 through stage 4 chronic kidney disease, or unspecified chronic kidney disease: Secondary | ICD-10-CM | POA: Diagnosis not present

## 2016-07-09 DIAGNOSIS — J449 Chronic obstructive pulmonary disease, unspecified: Secondary | ICD-10-CM | POA: Diagnosis not present

## 2016-07-09 DIAGNOSIS — N182 Chronic kidney disease, stage 2 (mild): Secondary | ICD-10-CM | POA: Diagnosis not present

## 2016-07-09 DIAGNOSIS — I2510 Atherosclerotic heart disease of native coronary artery without angina pectoris: Secondary | ICD-10-CM | POA: Diagnosis not present

## 2016-07-09 DIAGNOSIS — F0390 Unspecified dementia without behavioral disturbance: Secondary | ICD-10-CM | POA: Diagnosis not present

## 2016-07-09 DIAGNOSIS — Z87891 Personal history of nicotine dependence: Secondary | ICD-10-CM | POA: Diagnosis not present

## 2016-07-09 DIAGNOSIS — E1122 Type 2 diabetes mellitus with diabetic chronic kidney disease: Secondary | ICD-10-CM | POA: Diagnosis not present

## 2016-07-09 DIAGNOSIS — K219 Gastro-esophageal reflux disease without esophagitis: Secondary | ICD-10-CM | POA: Diagnosis not present

## 2016-07-09 DIAGNOSIS — I69351 Hemiplegia and hemiparesis following cerebral infarction affecting right dominant side: Secondary | ICD-10-CM | POA: Diagnosis not present

## 2016-07-10 DIAGNOSIS — I129 Hypertensive chronic kidney disease with stage 1 through stage 4 chronic kidney disease, or unspecified chronic kidney disease: Secondary | ICD-10-CM | POA: Diagnosis not present

## 2016-07-10 DIAGNOSIS — J449 Chronic obstructive pulmonary disease, unspecified: Secondary | ICD-10-CM | POA: Diagnosis not present

## 2016-07-10 DIAGNOSIS — N182 Chronic kidney disease, stage 2 (mild): Secondary | ICD-10-CM | POA: Diagnosis not present

## 2016-07-10 DIAGNOSIS — I2510 Atherosclerotic heart disease of native coronary artery without angina pectoris: Secondary | ICD-10-CM | POA: Diagnosis not present

## 2016-07-10 DIAGNOSIS — E1122 Type 2 diabetes mellitus with diabetic chronic kidney disease: Secondary | ICD-10-CM | POA: Diagnosis not present

## 2016-07-10 DIAGNOSIS — I69351 Hemiplegia and hemiparesis following cerebral infarction affecting right dominant side: Secondary | ICD-10-CM | POA: Diagnosis not present

## 2016-07-16 DIAGNOSIS — I69351 Hemiplegia and hemiparesis following cerebral infarction affecting right dominant side: Secondary | ICD-10-CM | POA: Diagnosis not present

## 2016-07-16 DIAGNOSIS — E1122 Type 2 diabetes mellitus with diabetic chronic kidney disease: Secondary | ICD-10-CM | POA: Diagnosis not present

## 2016-07-16 DIAGNOSIS — I129 Hypertensive chronic kidney disease with stage 1 through stage 4 chronic kidney disease, or unspecified chronic kidney disease: Secondary | ICD-10-CM | POA: Diagnosis not present

## 2016-07-16 DIAGNOSIS — I2510 Atherosclerotic heart disease of native coronary artery without angina pectoris: Secondary | ICD-10-CM | POA: Diagnosis not present

## 2016-07-16 DIAGNOSIS — J449 Chronic obstructive pulmonary disease, unspecified: Secondary | ICD-10-CM | POA: Diagnosis not present

## 2016-07-16 DIAGNOSIS — N182 Chronic kidney disease, stage 2 (mild): Secondary | ICD-10-CM | POA: Diagnosis not present

## 2016-07-18 ENCOUNTER — Ambulatory Visit: Payer: Medicare Other | Admitting: Adult Health

## 2016-07-18 DIAGNOSIS — E1122 Type 2 diabetes mellitus with diabetic chronic kidney disease: Secondary | ICD-10-CM | POA: Diagnosis not present

## 2016-07-18 DIAGNOSIS — I129 Hypertensive chronic kidney disease with stage 1 through stage 4 chronic kidney disease, or unspecified chronic kidney disease: Secondary | ICD-10-CM | POA: Diagnosis not present

## 2016-07-18 DIAGNOSIS — I2510 Atherosclerotic heart disease of native coronary artery without angina pectoris: Secondary | ICD-10-CM | POA: Diagnosis not present

## 2016-07-18 DIAGNOSIS — J449 Chronic obstructive pulmonary disease, unspecified: Secondary | ICD-10-CM | POA: Diagnosis not present

## 2016-07-18 DIAGNOSIS — I69351 Hemiplegia and hemiparesis following cerebral infarction affecting right dominant side: Secondary | ICD-10-CM | POA: Diagnosis not present

## 2016-07-18 DIAGNOSIS — N182 Chronic kidney disease, stage 2 (mild): Secondary | ICD-10-CM | POA: Diagnosis not present

## 2016-07-20 ENCOUNTER — Other Ambulatory Visit: Payer: Self-pay | Admitting: Adult Health

## 2016-07-20 NOTE — Telephone Encounter (Signed)
Pt's wife called Friday 5/18 to request Rx refill for  SEROQUEL 25 MG TABLETS ( taken 1 tablet at bedtime). --glh

## 2016-07-23 ENCOUNTER — Other Ambulatory Visit: Payer: Self-pay | Admitting: Cardiovascular Disease

## 2016-07-23 MED ORDER — NITROGLYCERIN 0.4 MG SL SUBL: 0.4000 mg | SUBLINGUAL_TABLET | SUBLINGUAL | 5 refills | Status: DC | PRN

## 2016-07-23 NOTE — Telephone Encounter (Signed)
We have not prescribed these medications for the patient previously.  Please review and refill if appropriate.  T. Micheline Markes, CMA  

## 2016-07-23 NOTE — Telephone Encounter (Signed)
Good Morning [name], This medication was started by Dr. Irean Hong Med. He was having confusion/agiation with the low dose Citalopram so that was d/c'd and he started on Quetiapine (Seroquel).  Since he is requesting the med, seems he tolerating it well and it is treating his acute sx's. He has appt with Neuro this wed (May 23). Advise him to dicuss continuing this med with Neurology and request refill from either Neuro or initiating provider-Dr. Letta Pate. Thanks! Valetta Fuller

## 2016-07-23 NOTE — Telephone Encounter (Signed)
Pt informed. Pt expressed understanding and is agreeable. Amanda Grissom CMA, RT 

## 2016-07-25 ENCOUNTER — Ambulatory Visit (INDEPENDENT_AMBULATORY_CARE_PROVIDER_SITE_OTHER): Payer: Medicare Other | Admitting: Nurse Practitioner

## 2016-07-25 ENCOUNTER — Encounter: Payer: Self-pay | Admitting: Nurse Practitioner

## 2016-07-25 VITALS — BP 132/75 | HR 75 | Ht 67.0 in | Wt 157.0 lb

## 2016-07-25 DIAGNOSIS — Z298 Encounter for other specified prophylactic measures: Secondary | ICD-10-CM

## 2016-07-25 DIAGNOSIS — G8191 Hemiplegia, unspecified affecting right dominant side: Secondary | ICD-10-CM

## 2016-07-25 DIAGNOSIS — I639 Cerebral infarction, unspecified: Secondary | ICD-10-CM

## 2016-07-25 DIAGNOSIS — E785 Hyperlipidemia, unspecified: Secondary | ICD-10-CM | POA: Diagnosis not present

## 2016-07-25 DIAGNOSIS — I480 Paroxysmal atrial fibrillation: Secondary | ICD-10-CM | POA: Diagnosis not present

## 2016-07-25 DIAGNOSIS — I10 Essential (primary) hypertension: Secondary | ICD-10-CM

## 2016-07-25 DIAGNOSIS — I69398 Other sequelae of cerebral infarction: Secondary | ICD-10-CM

## 2016-07-25 DIAGNOSIS — R269 Unspecified abnormalities of gait and mobility: Secondary | ICD-10-CM

## 2016-07-25 DIAGNOSIS — Z2989 Encounter for other specified prophylactic measures: Secondary | ICD-10-CM

## 2016-07-25 NOTE — Patient Instructions (Addendum)
Stressed the importance of management of risk factors to prevent further stroke Continue Eliquis for secondary stroke prevention and atrial fib Maintain strict control of hypertension with blood pressure goal below 130/90, today's reading 132/75 continue antihypertensive medications Control of diabetes with hemoglobin A1c below 6.5 followed by primary care  Cholesterol with LDL cholesterol less than 70, followed by primary care,  continue Lipitor Continue HEP  Therapies at [facility] for seizure disorder eat healthy diet with whole grains,  fresh fruits and vegetables Follow-up in 6 months

## 2016-07-25 NOTE — Progress Notes (Addendum)
[facility]  PATIENT: Herbert Marquez DOB: 1927/09/01   REASON FOR VISIT: Follow-up for stroke HISTORY FROM:patient and wife    HISTORY OF PRESENT ILLNESS:[name] is an 83 y.o. male with a history of CAD s/p stent, DM, Htn, Hld, TIA, previous CVA, S/P cerebral hemorrhage / SDH after TPA for an acute stroke on 03/30/2016,  PAF on Eliquis prior to this admission, GERD, bilateral carotid artery stenosis, BPH and UTIs who presented to his urologist'soffice and was noted to be confused and agitation. The patient's wife reports his right face was "drawing". 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  Leukocytosis 13.1 (afebrile) and Na 129. Ammonia - 37 (H). The patient is alert and oriented today; however, he was unable to give any details regarding this most recent admission. The patient's wife is at the bedside and she provides the history. Apparently they went to see his urologist on 06/04/2016. During that visit the patient suddenly became confused and agitated. An ambulance was called and the patient was brought to the hospital. He was admitted for further evaluation. The patient's wife reports that he seems to be confused in the early mornings since admission but clears during the day. He had right-sided weakness following his previous stroke although this has improved somewhat. He was getting PT and OT on an outpatient basis following his discharge from the rehabilitation unit on 04/14/2016. The patient's wife states that he has been compliant with his Eliquis. MRI of the head 4 mm right hippocampal infarct  likely acute, old left temporal lobe hemorrhage moderate chronic chronic small vessel ischemic disease. His spell on admission most suggestive of seizure was started on Keppra  at 500 mg twice daily EEG was abnormal due to moderate diffuse slowing of the waking background. Previous CT angiography in January 2018 right internal carotid artery stenosis.  Will need vascular outpatient evaluation. Patient returns to the stroke clinic today for follow-up he has not had further stroke TIA symptoms,  he remains on eliquis for secondary stroke prevention and atrial fibrillation. He has not had any further seizure activity and remains on Keppra 500 twice daily. He is on Lipitor for hyperlipidemia without complaints of muscle aches. His Seroquel controls his agitation according to the wife and he sleeps well at night.Appetite is reportedly good. His therapies have concluded. [facility]. He has no new neurologic findings  REVIEW OF SYSTEMS: Full 14 system review of systems performed and notable only for those listed, all others are neg:  Constitutional: neg  Cardiovascular: neg Ear/Nose/Throat: neg  Skin: neg Eyes: neg Respiratory: neg Gastroitestinal: Urinary frequency Hematology/Lymphatic: neg  Endocrine: Intolerance to cold Musculoskeletal: Weakness and gait abnormality Allergy/Immunology: neg Neurological: neg Psychiatric: neg Sleep : neg   ALLERGIES: No Known Allergies  HOME MEDICATIONS: Outpatient Medications Prior to Visit  Medication Sig Dispense Refill  . apixaban (ELIQUIS) 5 MG TABS tablet Take 1 tablet (5 mg total) by mouth 2 (two) times daily. 60 tablet 1  . atorvastatin (LIPITOR) 80 MG tablet Take 1 tablet (80 mg total) by mouth daily. (Patient taking differently: Take 80 mg by mouth at bedtime. ) 90 tablet 0  . diclofenac sodium (VOLTAREN) 1 % GEL Apply 2 g topically 4 (four) times daily. 4 Tube 1  . levETIRAcetam (KEPPRA) 500 MG tablet Take 1 tablet (500 mg total) by mouth 2 (two) times daily. 60 tablet 1  . Multiple Vitamin (MULTIVITAMIN WITH  MINERALS) TABS tablet Take 1 tablet by mouth daily.    . nitroGLYCERIN (NITROSTAT) 0.4 MG SL tablet Place 1 tablet (0.4 mg total) under the tongue every 5 (five) minutes as needed for chest pain. 25 tablet 5  . omeprazole (PRILOSEC) 20 MG capsule Take 20 mg by  mouth daily.    . polyvinyl alcohol (ARTIFICIAL TEARS) 1.4 % ophthalmic solution Place 1 drop into both eyes daily as needed for dry eyes.    [name] QUEtiapine (SEROQUEL) 25 MG tablet Take 1 tablet (25 mg total) by mouth at bedtime. 30 tablet 0   No facility-administered medications prior to visit.     PAST MEDICAL HISTORY: Past Medical History:  Diagnosis Date  . Arthritis    "pretty much all over"   . Basal cell carcinoma    "several burned off his body, face, head"  . BPH (benign prostatic hypertrophy)   . Coronary artery disease    a. s/p PCI of RCA in 2006  . CVA (cerebral infarction)    a. 06/2015: left thalamic and bilateral PCA  . GERD (gastroesophageal reflux disease)   . Hyperlipidemia   . Hypertension   . TIA (transient ischemic attack)    Approximately 6 weeks post-cardiac catheterization.   . Type II diabetes mellitus ([place])    "prediabetic; lost alot of weight; not diabetic now" (06/16/2015)    PAST SURGICAL HISTORY: Past Surgical History:  Procedure Laterality Date  . CARDIOVASCULAR STRESS TEST  07/01/2007   EF 74%  . CATARACT EXTRACTION, BILATERAL    . CORONARY ANGIOPLASTY WITH STENT PLACEMENT  10/2004   stenting x 2 to RCA  . FEMUR IM NAIL Right 11/26/2015   Procedure: INTRAMEDULLARY RIGHT (IM) NAIL FEMORAL;  Surgeon: Rod Can, MD;  Location: [facility];  Service: Orthopedics;  Laterality: Right;  . HERNIA REPAIR    . HIP ARTHROPLASTY  03/09/2011   Procedure: ARTHROPLASTY BIPOLAR HIP;  Surgeon: Mauri Pole;  Location: [facility];  Service: Orthopedics;  Laterality: Left;  . LAPAROSCOPIC INCISIONAL / UMBILICAL / VENTRAL HERNIA REPAIR     "below his naval"    FAMILY HISTORY: Family History  Problem Relation Age of Onset  . Hypertension Mother   . Lung cancer Father   . Lung cancer Brother     SOCIAL HISTORY: Social History   Social History  . Marital status: Married    Spouse name: N/A  . Number of children: N/A  . Years of education: N/A    Occupational History  . Retired Other   Social History Main Topics  . Smoking status: Former Smoker    Quit date: 03/06/1971  . Smokeless tobacco: Never Used  . Alcohol use 4.8 oz/week    1 Cans of beer, 7 Glasses of wine per week     Comment: 1/2 BEER AND 1 WINE  . Drug use: No  . Sexual activity: No   Other Topics Concern  . Not on file   Social History Narrative   Lives in [place], [place] with wife. Has 3 children.      PHYSICAL EXAM  Vitals:   07/25/16 1512  BP: 132/75  Pulse: 75  Weight: 157 lb (71.2 kg)  Height: 5\' 7"  (1.702 m)   Body mass index is 24.59 kg/m.  Generalized: Well developed, in no acute distress  Head: normocephalic and atraumatic,. Oropharynx benign  Neck: Supple, no carotid bruits  Cardiac: Regular rate rhythm, no murmur  Musculoskeletal: No deformity   Neurological examination  Mentation: Alert oriented to time, place, history taking. Attention span and concentration appropriate. Recent and remote memory intact.  Follows all commands speech with mild dysarthria   Cranial nerve II-XII: Pupils were equal round reactive to light extraocular movements were full, visual field were full on confrontational test. Facial sensation and strength were normal. Hard of hearing. Uvula tongue midline. head turning and shoulder shrug were normal and symmetric.Tongue protrusion into cheek strength was normal. Motor: normal bulk and tone, full strength in the BUE, BLE, except for right hemiparesis which is mild fine finger movements normal, no pronator drift. No focal weakness Sensory: normal and symmetric to light touch, Coordination: finger-nose-finger, heel-to-shin slow, right but able to perform  no dysmetria Reflexes: Brisker on the right than the left plantar responses were flexor bilaterally. Gait and Station: Not ambulated in wheelchair    DIAGNOSTIC DATA (LABS, IMAGING, TESTING) - I reviewed patient records, labs, notes, testing and imaging  myself where available.  Lab Results  Component Value Date   WBC 7.7 06/11/2016   HGB 11.8 (L) 06/11/2016   HCT 34.1 (L) 06/11/2016   MCV 90.0 06/11/2016   PLT 358 06/11/2016      Component Value Date/Time   NA 133 (L) 06/15/2016 0537   K 4.0 06/15/2016 0537   CL 102 06/15/2016 0537   CO2 23 06/15/2016 0537   GLUCOSE 92 06/15/2016 0537   BUN 14 06/15/2016 0537   CREATININE 0.94 06/15/2016 0537   CREATININE 1.36 (H) 06/14/2015 0901   CALCIUM 9.0 06/15/2016 0537   PROT 5.6 (L) 06/11/2016 0608   ALBUMIN 2.7 (L) 06/11/2016 0608   AST 87 (H) 06/11/2016 0608   ALT 65 (H) 06/11/2016 0608   ALKPHOS 74 06/11/2016 0608   BILITOT 0.6 06/11/2016 0608   GFRNONAA >60 06/15/2016 0537   GFRAA >60 06/15/2016 0537   Lab Results  Component Value Date   CHOL 77 03/31/2016   HDL 32 (L) 03/31/2016   LDLCALC 38 03/31/2016   TRIG 37 03/31/2016   CHOLHDL 2.4 03/31/2016   Lab Results  Component Value Date   HGBA1C 5.5 07/02/2016   Lab Results  Component Value Date   FGHWEXHB71 696 06/05/2016   Lab Results  Component Value Date   TSH 3.437 06/04/2016      ASSESSMENT AND PLAN  83 y.o. year old male  Here for  Hospital follow-up of acute ischemic stroke with known risk factors of coronary artery disease prior strokes diabetes hypertension hyperlipidemia atrial fibrillation carotid stenosis age. Probable seizure EEG with mild slowing. Patient was not seen by the stroke service in the hospital The patient is a current patient of Dr. Erlinda Hong  who is out of the office today . This note is sent to the work in doctor.     PLAN: Stressed the importance of management of risk factors to prevent further stroke Continue Eliquis for secondary stroke prevention and atrial fib Maintain strict control of hypertension with blood pressure goal below 130/90, today's reading 132/75 continue antihypertensive medications Control of diabetes with hemoglobin A1c below 6.5 followed by primary care  Cholesterol  with LDL cholesterol less than 70, followed by primary care,  continue Lipitor Continue HEP and therapies with the [facility] Continue Keppra for seizure disorder eat healthy diet with whole grains,  fresh fruits and vegetables Follow-up with Dr. Erlinda Hong in July Discussed risk for recurrent stroke/ TIA and answered additional questions This was a visit requiring 67minutes and medical decision making of high complexity with extensive review  of history, hospital chart, counseling and answering questions Dennie Bible, [facility], [facility], APRN  [facility] [address] [place], [place] [address] [phone]  I reviewed the above note and documentation by the Nurse Practitioner and agree with the history, physical exam, assessment and plan as outlined above. I was immediately available for face-to-face consultation. Star Age, MD, PhD [facility])

## 2016-07-26 ENCOUNTER — Other Ambulatory Visit: Payer: Self-pay | Admitting: Physical Medicine & Rehabilitation

## 2016-07-26 ENCOUNTER — Other Ambulatory Visit: Payer: Self-pay

## 2016-07-27 ENCOUNTER — Ambulatory Visit: Payer: Medicare Other | Admitting: Physical Medicine & Rehabilitation

## 2016-08-01 ENCOUNTER — Encounter: Payer: Self-pay | Admitting: Cardiovascular Disease

## 2016-08-13 DIAGNOSIS — N400 Enlarged prostate without lower urinary tract symptoms: Secondary | ICD-10-CM | POA: Diagnosis not present

## 2016-08-14 ENCOUNTER — Ambulatory Visit
Payer: Medicare Other | Attending: Physical Medicine & Rehabilitation | Admitting: Rehabilitative and Restorative Service Providers"

## 2016-08-14 DIAGNOSIS — R2681 Unsteadiness on feet: Secondary | ICD-10-CM | POA: Diagnosis not present

## 2016-08-14 DIAGNOSIS — R2689 Other abnormalities of gait and mobility: Secondary | ICD-10-CM | POA: Diagnosis not present

## 2016-08-14 DIAGNOSIS — M6281 Muscle weakness (generalized): Secondary | ICD-10-CM | POA: Diagnosis not present

## 2016-08-14 NOTE — Therapy (Signed)
[facility] [address] [place], [place], [address] Phone: [phone]   Fax:  [phone]  Physical Therapy Evaluation  Patient Details  Name: Herbert Marquez MRN: [id number] Date of Birth: [birth year] Referring Provider: Alysia Penna, MD  Encounter Date: 08/14/2016      PT End of Session - 08/14/16 2106    Visit Number 1   Number of Visits 16   Date for PT Re-Evaluation 10/13/16   Authorization Type G code every 10th visit   PT Start Time 1105   PT Stop Time 1145   PT Time Calculation (min) 40 min   Equipment Utilized During Treatment Gait belt      Past Medical History:  Diagnosis Date  . Arthritis    "pretty much all over"   . Basal cell carcinoma    "several burned off his body, face, head"  . BPH (benign prostatic hypertrophy)   . Coronary artery disease    a. s/p PCI of RCA in 2006  . CVA (cerebral infarction)    a. 06/2015: left thalamic and bilateral PCA  . GERD (gastroesophageal reflux disease)   . Hyperlipidemia   . Hypertension   . TIA (transient ischemic attack)    Approximately 6 weeks post-cardiac catheterization.   . Type II diabetes mellitus ([place])    "prediabetic; lost alot of weight; not diabetic now" (06/16/2015)    Past Surgical History:  Procedure Laterality Date  . CARDIOVASCULAR STRESS TEST  07/01/2007   EF 74%  . CATARACT EXTRACTION, BILATERAL    . CORONARY ANGIOPLASTY WITH STENT PLACEMENT  10/2004   stenting x 2 to RCA  . FEMUR IM NAIL Right 11/26/2015   Procedure: INTRAMEDULLARY RIGHT (IM) NAIL FEMORAL;  Surgeon: Rod Can, MD;  Location: [facility];  Service: Orthopedics;  Laterality: Right;  . HERNIA REPAIR    . HIP ARTHROPLASTY  03/09/2011   Procedure: ARTHROPLASTY BIPOLAR HIP;  Surgeon: Mauri Pole;  Location: [facility];  Service: Orthopedics;  Laterality: Left;  . LAPAROSCOPIC INCISIONAL / UMBILICAL / VENTRAL HERNIA REPAIR     "below his naval"    There were no  vitals filed for this visit.       Subjective Assessment - 08/14/16 1113    Subjective The patient was admitted to [facility] 06/04/16 due to facial droop, difficulty talking while at MD office--stroke symtpoms.  Patient's wife notes he also had a seizure.    He transferred to IP rehab on 06/10/16 and d/c home on 06/20/16 with [facility] PT.  He just completed home health PT and is now referred to OP PT to continue rehab process. Patient notes he walks some every day with assistance from his wife.  He is using a walker to do this.  Prior to CVA he used a SPC.    Pertinent History L hip replace, R hip fracture, h/o multiple CVAs, vascular dementia.   Patient Stated Goals Trying to get back to walking.   Currently in Pain? Yes   Pain Score 5    Pain Location Knee   Pain Orientation Right   Pain Descriptors / Indicators Aching   Pain Type Chronic pain   Pain Onset More than a month ago   Pain Frequency Constant   Aggravating Factors  PT to monitor response to treatment- no goal to follow due to nature of referral.            [facility] PT Assessment - 08/14/16 1118  Assessment   Medical Diagnosis thalamic stroke   Referring Provider Alysia Penna, MD   Onset Date/Surgical Date 06/04/16   Hand Dominance Right   Prior Therapy prior IP rehab, home health PT.     Precautions   Precautions Fall   Required Braces or Orthoses Other Brace/Splint  AFO   Other Brace/Splint R knee brace -- reports R knee buckles without use of brace     Restrictions   Weight Bearing Restrictions No     Balance Screen   Has the patient fallen in the past 6 months No   Has the patient had a decrease in activity level because of a fear of falling?  Yes  due to recent CVA   Is the patient reluctant to leave their home because of a fear of falling?  Yes     [place] Private residence   Living Arrangements Spouse/significant other   Type of [place] One level   Home Equipment Wheelchair - manual;Walker - 2 wheels;Shower seat;Walker - 4 wheels;Cane - single point;Bedside commode;Toilet riser;Grab bars - toilet;Grab bars - tub/shower     Prior Function   Level of Independence Independent with household mobility with device;Independent with community mobility with device   Leisure watch TV, reading, games     Cognition   Overall Cognitive Status --     Sensation   Light Touch Impaired Detail   Light Touch Impaired Details Impaired RLE;Impaired RUE   Additional Comments Patient notes "I don't know where my foot is" during walking activities.     Posture/Postural Control   Posture/Postural Control Postural limitations   Postural Limitations Rounded Shoulders;Forward head     ROM / Strength   AROM / PROM / Strength AROM;Strength     Strength   Overall Strength Deficits   Strength Assessment Site Hip;Knee;Ankle   Right/Left Hip Left;Right   Right Hip Flexion 3/5   Left Hip Flexion 3+/5   Right/Left Knee Right;Left   Right Knee Flexion 3/5   Right Knee Extension 3/5   Left Knee Flexion 4/5   Left Knee Extension 4/5   Right/Left Ankle Right;Left   Right Ankle Dorsiflexion 3/5   Left Ankle Dorsiflexion 4/5     Bed Mobility   Bed Mobility Supine to Sit;Sit to Supine   Supine to Sit 6: Modified independent (Device/Increase time)  per patient and wife report   Sit to Supine 6: Modified independent (Device/Increase time)  per patient and wife report     Transfers   Transfers Sit to Stand;Stand to [employer] Transfers   Sit to Stand 4: Min guard   Sit to Stand Details (indicate cue type and reason) Steadying assist needed upon rising   Stand to Sit 4: Min guard   Stand Pivot Transfers 4: Min assist   Stand Pivot Transfer Details (indicate cue type and reason) PT locked breaks due to using transport chair and patient sequences transfer without cues.  He needed steadying assist upon rising before turning to sit  on mat.     Ambulation/Gait   Ambulation/Gait Yes   Ambulation/Gait Assistance 4: Min assist   Ambulation/Gait Assistance Details with R brace   Ambulation Distance (Feet) 100 Feet   Assistive device Rolling walker   Gait Pattern Decreased step length - left;Poor foot clearance - right  R knee stays flexed   Gait velocity 32.8/50.41=0.65 ft/sec     Standardized Balance  Assessment   Standardized Balance Assessment Berg Balance Test     Berg Balance Test   Sit to Stand Needs minimal aid to stand or to stabilize   Standing Unsupported Able to stand 30 seconds unsupported   Sitting with Back Unsupported but Feet Supported on Floor or Stool Able to sit safely and securely 2 minutes   Stand to Sit Controls descent by using hands   Transfers Needs one person to assist   Standing Unsupported with Eyes Closed Needs help to keep from falling   Standing Ubsupported with Feet Together Needs help to attain position and unable to hold for 15 seconds   From Standing, Reach Forward with Outstretched Arm Loses balance while trying/requires external support   From Standing Position, Pick up Object from Floor Unable to try/needs assist to keep balance   From Standing Position, Turn to Look Behind Over each Shoulder Needs assist to keep from losing balance and falling   Turn 360 Degrees Needs assistance while turning   Standing Unsupported, Alternately Place Feet on Step/Stool Needs assistance to keep from falling or unable to try   Standing Unsupported, One Foot in [employer] balance while stepping or standing   Standing on One Leg Unable to try or needs assist to prevent fall   Total Score 11   Berg comment: 11/56 indicating high risk for falls.  Patient relies on walker when in standing for balance support.            Objective measurements completed on examination: See above findings.          [facility] PT Treatment/Exercise - 08/14/16 1118      Self-Care   Self-Care Other  Self-Care Comments   Other Self-Care Comments  current HEP from home health:  ankle pumps, alternating straightening the legs, marching, moves knees in and out, sitting sit-ups, sit to stand.                  PT Education - 08/14/16 2106    Education provided Yes   Education Details plan of care for PT and frequency/duration recommended   Person(s) Educated Patient;Spouse   Methods Explanation   Comprehension Verbalized understanding          PT Short Term Goals - 08/14/16 2107      PT SHORT TERM GOAL #1   Title The patient will perform HEP with assist from wife as needed for LE strengthening, standing balance and general mobility.  TARGET DATE FOR ALL STGs:  09/13/16   Time 4   Period Weeks     PT SHORT TERM GOAL #2   Title The patient will improve Berg score from 11/56 to > or equal to 20/56 to demo improving ability stand without UE support for ADLs.   Time 4   Period Weeks     PT SHORT TERM GOAL #3   Title The patient will improve gait speed from 0.65 ft/sec to > or equal to 1.3 ft/sec to demo improved household ambulation (with least restrictive assistive device).   Time 4   Period Weeks     PT SHORT TERM GOAL #4   Title The patient will perform sit<>stand with UE support modified indep.   Time 4   Period Weeks     PT SHORT TERM GOAL #5   Title The patient will be furthe assessed on stairs and LTG to follow.   Time 4   Period Weeks     Additional Short Term  Goals   Additional Short Term Goals Yes     PT SHORT TERM GOAL #6   Title The patient will ambulate for 150 ft with least restrictive device modified indep.   Time 8   Period Weeks           PT Long Term Goals - 08/14/16 2111      PT LONG TERM GOAL #1   Title The patient will be indep with progression of HEP.  TARGET DATE FOR ALL LTGs:  10/14/16   Time 8   Period Weeks     PT LONG TERM GOAL #2   Title The patient will improve Berg score from 11/56 to > or equal to 30/56 to demo improving  balance for ADLs.    Time 8   Period Weeks     PT LONG TERM GOAL #3   Title The patient will improve gait speed from 0.65 ft/sec to > or equal to 1.6 ft/sec to demo improving mobility.   Time 8   Period Weeks     PT LONG TERM GOAL #4   Title The patient will move sit<>stand x 5 times with UE support modified indep without use of device to stabilize upon rising to demo improving balance during transitional movements.   Time 8   Period Weeks     PT LONG TERM GOAL #5   Title The patient will ambulate x 400 ft nonstop with least restrictive assistive device modified indep for improved assess to home/limited community surfaces.   Time 8   Period Weeks     Additional Long Term Goals   Additional Long Term Goals Yes     PT LONG TERM GOAL #6   Title LTG to be written for stair negotiation.                Plan - 08/14/16 2121    Clinical Impression Statement The patient is an 83 year old male hospitalized s/p CVA from 06/04/16-06/20/16.  He presents to PT with high fall risk per Berg score of 11/56 and gait speed of 0.65 ft/sec.  He ambulates in the home with assist from his wife.  PT to emphasize functional mobility training to promote improved independence for household ambulation and reduce risk for falls.    History and Personal Factors relevant to plan of care: Needs assistance in home   Clinical Presentation Stable   Clinical Decision Making Low   Rehab Potential Good   Clinical Impairments Affecting Rehab Potential Barriers to rehab include multiple CVAs, diminished sensation.  Enhancers to rehab potential include family support, patient motivation.   PT Frequency 2x / week   PT Duration 8 weeks   PT Treatment/Interventions ADLs/Self Care Home Management;Therapeutic activities;Therapeutic exercise;Neuromuscular re-education;Patient/family education;Functional mobility training;Gait training;DME Instruction;Stair training;Manual techniques;Orthotic Fit/Training;Wheelchair  mobility training   PT Next Visit Plan Establish HEP:  right hamstring stretching, LE strengthening, standing balance near support surface dec'ing UE support.  Patient has supportive spouse to help with HEP as needed.  Gait activities in standing emphasizing R knee extension at heel strike to mid stance.   Consulted and Agree with Plan of Care Patient;Family member/caregiver   Family Member Consulted spouse      Patient will benefit from skilled therapeutic intervention in order to improve the following deficits and impairments:  Abnormal gait, Decreased balance, Difficulty walking, Decreased strength, Decreased mobility, Impaired sensation, Postural dysfunction, Impaired flexibility, Pain  Visit Diagnosis: Other abnormalities of gait and mobility  Muscle weakness (generalized)  Unsteadiness on feet      G-Codes - 09/09/16 Jun 28, 2230    Functional Assessment Tool Used (Outpatient Only) Berg=11/56, gait speed=0.65 ft/sec   Functional Limitation Mobility: Walking and moving around   Mobility: Walking and Moving Around Current Status [phone]) At least 60 percent but less than 80 percent impaired, limited or restricted   Mobility: Walking and Moving Around Goal Status [phone]) At least 40 percent but less than 60 percent impaired, limited or restricted       Problem List Patient Active Problem List   Diagnosis Date Noted  . Restlessness and agitation 07/02/2016  . Vascular dementia with behavior disturbance 06/29/2016  . Hemiparesis and other late effects of cerebrovascular accident ([place]) 06/29/2016  . Embolic stroke ([place]) 97/41/6384  . Right hemiparesis ([place])   . History of CVA with residual deficit   . Chronic anticoagulation   . Atrial fibrillation with rapid ventricular response ([place])   . Seizure prophylaxis   . Diastolic dysfunction   . Bacteremia due to Gram-positive bacteria   . Altered mental status   . Dementia without behavioral disturbance   . Leukocytosis   . Acute blood  loss anemia   . Acute encephalopathy 06/04/2016  . PAF (paroxysmal atrial fibrillation) ([place])   . Iron deficiency anemia 04/04/2016  . History of CVA (cerebrovascular accident) 04/04/2016  . Cerebral hemorrhage (HCC) w/ SDH s/p IV tPA   . Coronary artery disease involving native coronary artery of native heart without angina pectoris   . Orthostatic hypotension   . History of right hip replacement   . Acute ischemic stroke ([place]) - L temporal lobe s/p tPA 03/30/2016  . BPH (benign prostatic hyperplasia) 11/25/2015  . GERD (gastroesophageal reflux disease) 11/25/2015  . CKD (chronic kidney disease), stage II 11/25/2015  . Overactive bladder   . Cerebrovascular accident (CVA) ([place]) 09/18/2015  . Gait disturbance, post-stroke 06/23/2015  . 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 Thalamic infarction ([place]) 06/21/2015  . Benign essential HTN   . Type 2 diabetes mellitus with complication, without long-term current use of insulin ([place])   . Dysphagia as late effect of cerebrovascular disease   . Hyponatremia   . HLD (hyperlipidemia)   . Obesity 03/09/2011  . COPD (chronic obstructive pulmonary disease) ([place]) 03/09/2011    [place], PT September 09, 2016, 10:34 PM  [facility] [address] [place], [place], [address] Phone: [phone]   Fax:  [phone]  Name: MAKS CAVALLERO MRN: [id number] Date of Birth: 1927-05-31

## 2016-08-17 ENCOUNTER — Other Ambulatory Visit: Payer: Self-pay | Admitting: Physical Medicine and Rehabilitation

## 2016-08-18 ENCOUNTER — Other Ambulatory Visit: Payer: Self-pay | Admitting: Physical Medicine & Rehabilitation

## 2016-08-18 ENCOUNTER — Other Ambulatory Visit: Payer: Self-pay | Admitting: Adult Health

## 2016-08-20 ENCOUNTER — Ambulatory Visit (INDEPENDENT_AMBULATORY_CARE_PROVIDER_SITE_OTHER): Payer: Medicare Other | Admitting: Cardiovascular Disease

## 2016-08-20 ENCOUNTER — Telehealth: Payer: Self-pay | Admitting: Adult Health

## 2016-08-20 ENCOUNTER — Encounter: Payer: Self-pay | Admitting: Cardiovascular Disease

## 2016-08-20 ENCOUNTER — Other Ambulatory Visit: Payer: Self-pay | Admitting: Nurse Practitioner

## 2016-08-20 ENCOUNTER — Encounter (INDEPENDENT_AMBULATORY_CARE_PROVIDER_SITE_OTHER): Payer: Self-pay

## 2016-08-20 VITALS — BP 116/74 | HR 58 | Ht 67.0 in | Wt 160.6 lb

## 2016-08-20 DIAGNOSIS — I2510 Atherosclerotic heart disease of native coronary artery without angina pectoris: Secondary | ICD-10-CM

## 2016-08-20 DIAGNOSIS — I639 Cerebral infarction, unspecified: Secondary | ICD-10-CM

## 2016-08-20 DIAGNOSIS — I480 Paroxysmal atrial fibrillation: Secondary | ICD-10-CM | POA: Diagnosis not present

## 2016-08-20 MED ORDER — LEVETIRACETAM 500 MG PO TABS: 500.0000 mg | ORAL_TABLET | Freq: Two times a day (BID) | ORAL | 1 refills | Status: DC

## 2016-08-20 NOTE — Telephone Encounter (Signed)
Continue with keppra.  

## 2016-08-20 NOTE — Progress Notes (Signed)
Cardiology Office Note   Date:  08/20/2016   ID:  Herbert Marquez, DOB [birth year], MRN [id number]  PCP:  [name], NP  Cardiologist:   [name], MD   Chief Complaint  Patient presents with  . Atrial Fibrillation   Problem list:  1. Coronary artery disease-status post PTCA and stenting of the RCA in [place], [place]  2. Dyslipidemia  3. Hypertension  4. Diabetes mellitus  5. Hernia repair   Prior Notes:   Pt has done well from a cardiac standpoint. He fell and broke his left hip in January . His is recovering well. He is still walking with a cane. He had no complications during or after surgery.  He had a severe UTI in June / July of 2013. He has been seen by Urology and was started on Tamulosin for BPH.  He's not had any episodes of chest pain or shortness breath. He still walks with a cane but is able to get out and stays fairly active.  October 02, 2012:  Herbert Marquez is doing well. No CP , no dyspnea. He remains active - went to the beach last week. He walks at [facility] when he is down there. Goes to the [facility] 3 times a week. No angina.  Jan. 30, 2015:  Herbert Marquez is doing well. He has not had any cardiac problems. He has been to the beach recently.  He goes to the [facility] 2-3 times a week. He doesn't have any angina. His primary medical doctor is managing his lipids.    Feb. 18, 2016:   Herbert Marquez is a 83 y.o. male who presents for follow up of his CAD. NO CP or dyspnea.    Limited by leg and back pains.  He and his wife celebrated their 59th wedding anniversary.   They went to [facility] last week.    October 25, 2014:  Doing well from a cardiac standpoint..  Has a circk in his neck Staying buys,   Still has his place at [facility].   May 10, 2015: Doing great. Some DOE  Still going down to [facility]  No CP, Wants refill on NTG - lost his bottle  May 17, 2016:   Has had a rough year since I last saw him  Has had 2 strokes since I last saw  him  Has severe weakness of his right side now ( 1st stroke)  The 2nd stroke affected his memory - has improved.  Was found to have PAF during his hospitalization in Feb.  Was on plavix,  Has been changed to [facility]   August 20, 2016:  Herbert Marquez is seen today  Had another small stroke since [facility] saw him in March  No cardiac issues   Past Medical History:  Diagnosis Date  . Arthritis    "pretty much all over"   . Basal cell carcinoma    "several burned off his body, face, head"  . BPH (benign prostatic hypertrophy)   . Coronary artery disease    a. s/p PCI of RCA in 2006  . CVA (cerebral infarction)    a. 06/2015: left thalamic and bilateral PCA  . GERD (gastroesophageal reflux disease)   . Hyperlipidemia   . Hypertension   . TIA (transient ischemic attack)    Approximately 6 weeks post-cardiac catheterization.   . Type II diabetes mellitus ([place])    "prediabetic; lost alot of weight; not diabetic now" (06/16/2015)  Past Surgical History:  Procedure Laterality Date  . CARDIOVASCULAR STRESS TEST  07/01/2007   EF 74%  . CATARACT EXTRACTION, BILATERAL    . CORONARY ANGIOPLASTY WITH STENT PLACEMENT  10/2004   stenting x 2 to RCA  . FEMUR IM NAIL Right 11/26/2015   Procedure: INTRAMEDULLARY RIGHT (IM) NAIL FEMORAL;  Surgeon: Rod Can, MD;  Location: [facility];  Service: Orthopedics;  Laterality: Right;  . HERNIA REPAIR    . HIP ARTHROPLASTY  03/09/2011   Procedure: ARTHROPLASTY BIPOLAR HIP;  Surgeon: Mauri Pole;  Location: [facility];  Service: Orthopedics;  Laterality: Left;  . LAPAROSCOPIC INCISIONAL / UMBILICAL / VENTRAL HERNIA REPAIR     "below his naval"     Current Outpatient Prescriptions  Medication Sig Dispense Refill  . apixaban (ELIQUIS) 5 MG TABS tablet Take 1 tablet (5 mg total) by mouth 2 (two) times daily. 60 tablet 1  . atorvastatin (LIPITOR) 80 MG tablet TAKE 1 TABLET BY MOUTH DAILY. 90 tablet 0  . diclofenac sodium (VOLTAREN) 1 % GEL Apply 2 g topically  4 (four) times daily. 4 Tube 1  . levETIRAcetam (KEPPRA) 500 MG tablet Take 1 tablet (500 mg total) by mouth 2 (two) times daily. 60 tablet 1  . Multiple Vitamin (MULTIVITAMIN WITH MINERALS) TABS tablet Take 1 tablet by mouth daily.    . nitroGLYCERIN (NITROSTAT) 0.4 MG SL tablet Place 1 tablet (0.4 mg total) under the tongue every 5 (five) minutes as needed for chest pain. 25 tablet 5  . omeprazole (PRILOSEC) 20 MG capsule Take 20 mg by mouth daily.    . polyvinyl alcohol (ARTIFICIAL TEARS) 1.4 % ophthalmic solution Place 1 drop into both eyes daily as needed for dry eyes.    [name] QUEtiapine (SEROQUEL) 25 MG tablet TAKE 1 TABLET BY MOUTH AT BEDTIME. 30 tablet 0   No current facility-administered medications for this visit.     Allergies:   Patient has no known allergies.    Social History:  The patient  reports that he quit smoking about 45 years ago. He has never used smokeless tobacco. He reports that he drinks about 4.8 oz of alcohol per week . He reports that he does not use drugs.   Family History:  The patient's family history includes Hypertension in his mother; Lung cancer in his brother and father.    ROS:  Please see the history of present illness.    Review of Systems: Constitutional:  denies fever, chills, diaphoresis, appetite change and fatigue.  HEENT: denies photophobia, eye pain, redness, hearing loss, ear pain, congestion, sore throat, rhinorrhea, sneezing, neck pain, neck stiffness and tinnitus.  Respiratory: denies SOB, DOE, cough, chest tightness, and wheezing.  Cardiovascular: denies chest pain, palpitations and leg swelling.  Gastrointestinal: denies nausea, vomiting, abdominal pain, diarrhea, constipation, blood in stool.  Genitourinary: denies dysuria, urgency, frequency, hematuria, flank pain and difficulty urinating.  Musculoskeletal: denies  myalgias, back pain, joint swelling, arthralgias and gait problem.   Skin: denies pallor, rash and wound.  Neurological:  denies dizziness, seizures, syncope, weakness, light-headedness, numbness and headaches.   Hematological: denies adenopathy, easy bruising, personal or family bleeding history.  Psychiatric/ Behavioral: denies suicidal ideation, mood changes, confusion, nervousness, sleep disturbance and agitation.       All other systems are reviewed and negative.    PHYSICAL EXAM: VS:  BP 116/74   Pulse (!) 58   Ht 5\' 7"  (1.702 m)   Wt 160 lb 9.6 oz (72.8 kg)  BMI 25.15 kg/m  , BMI Body mass index is 25.15 kg/m. GEN: chronically ill appearing man,  Examined in his wheelchair   HEENT: normal  Neck: no JVD, carotid bruits, or masses Cardiac: RRR; no murmurs, rubs, or gallops,no edema  Respiratory:  clear to auscultation bilaterally, normal work of breathing GI: soft, nontender, nondistended, + BS MS: no deformity or atrophy  Skin: warm and dry, no rash Neuro:  Strength and sensation are intact Psych: normal   EKG:  EKG is ordered today. Sinus brady at 57.   1st degree AV block  LAHB   Recent Labs: 06/04/2016: TSH 3.437 06/07/2016: Magnesium 1.8 06/11/2016: ALT 65; Hemoglobin 11.8; Platelets 358 06/15/2016: BUN 14; Creatinine, Ser 0.94; Potassium 4.0; Sodium 133    Lipid Panel    Component Value Date/Time   CHOL 77 03/31/2016 0353   TRIG 37 03/31/2016 0353   HDL 32 (L) 03/31/2016 0353   CHOLHDL 2.4 03/31/2016 0353   VLDL 7 03/31/2016 0353   LDLCALC 38 03/31/2016 0353      Wt Readings from Last 3 Encounters:  08/20/16 160 lb 9.6 oz (72.8 kg)  07/25/16 157 lb (71.2 kg)  06/19/16 167 lb 11.2 oz (76.1 kg)      Other studies Reviewed: Additional studies/ records that were reviewed today include: . Review of the above records demonstrates:    ASSESSMENT AND PLAN:  1. Coronary artery disease-status post PTCA and stenting of the RCA in [place], [place]  - he is doing well.  No angina.   his activity has been greatly reduced recently since the recent strokes.   2.  Dyslipidemia -   . Continue current dose of atorvastatin 80 mg a day.     Will check in 6 months at next visit   3. Hypertension  - his blood pressures basically  well-controlled.    4. Diabetes mellitus  - followed by his general medical doctor.  5. Hernia repair   6. Paroxysmal atrial fib:   On Elliquis   7.  CVAs:   Has had several strokes recently . Still on Eliquis    Current medicines are reviewed at length with the patient today.  The patient does not have concerns regarding medicines.  The following changes have been made:  no change   Disposition:   FU with me in 6 months     Signed, [name], MD  08/20/2016 11:06 AM    [facility] [place], [place], [place]  [address] Phone: [phone]; Fax: [phone]

## 2016-08-20 NOTE — Addendum Note (Signed)
Addended by: Brandon Melnick on: 08/20/2016 03:07 PM   Modules accepted: Orders

## 2016-08-20 NOTE — Telephone Encounter (Addendum)
Pt was given this originally at [facility], in rehab, by Reesa Chew, PA.  Our last note  07-25-16 states to continue keppra 500mg  po bid.

## 2016-08-20 NOTE — Telephone Encounter (Signed)
Patients wife called office requesting refill for levETIRAcetam (KEPPRA) 500 MG tablet.  Pharmacy- [facility] at [facility].

## 2016-08-20 NOTE — Telephone Encounter (Signed)
Recieved electronic medication refill request for levetiracetam, no mention in previous note to continue this medication, also last note stated:   I'm questioning whether he actually had a seizure, his wife described the seizure that was noted around the last acute hospitalization. This sounded more like an episode of agitation.  Please advise

## 2016-08-20 NOTE — Telephone Encounter (Signed)
Please direct all further refills of Keppra to Neurology Cecille Rubin at [facility] neurology

## 2016-08-20 NOTE — Telephone Encounter (Signed)
Pt left Voicemail requesting Herbert Marquez call her-- left no reason for request. -glh

## 2016-08-20 NOTE — Patient Instructions (Signed)

## 2016-08-21 NOTE — Telephone Encounter (Signed)
Spoke with patient and his wife and there call was cleared up the day before.

## 2016-08-24 ENCOUNTER — Encounter: Payer: Self-pay | Admitting: Physical Medicine & Rehabilitation

## 2016-08-24 ENCOUNTER — Encounter: Payer: Medicare Other | Attending: Physical Medicine & Rehabilitation

## 2016-08-24 ENCOUNTER — Ambulatory Visit (HOSPITAL_BASED_OUTPATIENT_CLINIC_OR_DEPARTMENT_OTHER): Payer: Medicare Other | Admitting: Physical Medicine & Rehabilitation

## 2016-08-24 VITALS — BP 130/74 | HR 71 | Resp 14

## 2016-08-24 DIAGNOSIS — I69898 Other sequelae of other cerebrovascular disease: Secondary | ICD-10-CM | POA: Diagnosis present

## 2016-08-24 DIAGNOSIS — I69398 Other sequelae of cerebral infarction: Secondary | ICD-10-CM

## 2016-08-24 DIAGNOSIS — R269 Unspecified abnormalities of gait and mobility: Secondary | ICD-10-CM | POA: Diagnosis not present

## 2016-08-24 DIAGNOSIS — I69998 Other sequelae following unspecified cerebrovascular disease: Secondary | ICD-10-CM | POA: Insufficient documentation

## 2016-08-24 DIAGNOSIS — I69359 Hemiplegia and hemiparesis following cerebral infarction affecting unspecified side: Secondary | ICD-10-CM

## 2016-08-24 DIAGNOSIS — I639 Cerebral infarction, unspecified: Secondary | ICD-10-CM | POA: Diagnosis not present

## 2016-08-24 NOTE — Patient Instructions (Signed)
Call for knee injection

## 2016-08-24 NOTE — Progress Notes (Signed)
Subjective:    Patient ID: Herbert Marquez, male    DOB: June 26, 1927, 83 y.o.   MRN: [id number]  HPI amb 100" with walker, does well with ramp, wife follows him Needs no help with pants ModI with showering, using railings No agitation at bedtime  occ feels down but improving Some improvement with memory, still occ word finding deficits Pain Inventory Average Pain 4 Pain Right Now 0 My pain is constant  In the last 24 hours, has pain interfered with the following? General activity 1 Relation with others 0 Enjoyment of life 1 What TIME of day is your pain at its worst? n/a Sleep (in general) Good  Pain is worse with: walking and some activites Pain improves with: rest Relief from Meds: 1  Mobility walk with assistance use a walker use a wheelchair transfers alone Do you have any goals in this area?  yes  Function retired  Neuro/Psych weakness trouble walking anxiety  Prior Studies Any changes since last visit?  no  Physicians involved in your care Any changes since last visit?  no   Family History  Problem Relation Age of Onset  . Hypertension Mother   . Lung cancer Father   . Lung cancer Brother    Social History   Social History  . Marital status: Married    Spouse name: N/A  . Number of children: N/A  . Years of education: N/A   Occupational History  . Retired Other   Social History Main Topics  . Smoking status: Former Smoker    Quit date: 03/06/1971  . Smokeless tobacco: Never Used  . Alcohol use 4.8 oz/week    1 Cans of beer, 7 Glasses of wine per week     Comment: 1/2 BEER AND 1 WINE  . Drug use: No  . Sexual activity: No   Other Topics Concern  . None   Social History Narrative   Lives in [place], [place] with wife. Has 3 children.    Past Surgical History:  Procedure Laterality Date  . CARDIOVASCULAR STRESS TEST  07/01/2007   EF 74%  . CATARACT EXTRACTION, BILATERAL    . CORONARY ANGIOPLASTY WITH STENT PLACEMENT  10/2004     stenting x 2 to RCA  . FEMUR IM NAIL Right 11/26/2015   Procedure: INTRAMEDULLARY RIGHT (IM) NAIL FEMORAL;  Surgeon: Rod Can, MD;  Location: [facility];  Service: Orthopedics;  Laterality: Right;  . HERNIA REPAIR    . HIP ARTHROPLASTY  03/09/2011   Procedure: ARTHROPLASTY BIPOLAR HIP;  Surgeon: Mauri Pole;  Location: [facility];  Service: Orthopedics;  Laterality: Left;  . LAPAROSCOPIC INCISIONAL / UMBILICAL / VENTRAL HERNIA REPAIR     "below his naval"   Past Medical History:  Diagnosis Date  . Arthritis    "pretty much all over"   . Basal cell carcinoma    "several burned off his body, face, head"  . BPH (benign prostatic hypertrophy)   . Coronary artery disease    a. s/p PCI of RCA in 2006  . CVA (cerebral infarction)    a. 06/2015: left thalamic and bilateral PCA  . GERD (gastroesophageal reflux disease)   . Hyperlipidemia   . Hypertension   . TIA (transient ischemic attack)    Approximately 6 weeks post-cardiac catheterization.   . Type II diabetes mellitus ([place])    "prediabetic; lost alot of weight; not diabetic now" (06/16/2015)   BP 130/74   Pulse 71   Resp 14  SpO2 98%   Opioid Risk Score:   Fall Risk Score:  `1  Depression screen PHQ 2/9  Depression screen [facility] 2/9 06/29/2016 03/20/2016 08/18/2015 07/21/2015  Decreased Interest 1 1 0 0  Down, Depressed, Hopeless 1 1 0 1  PHQ - 2 Score 2 2 0 1  Altered sleeping 0 3 - 0  Tired, decreased energy 2 0 - 2  Change in appetite 0 0 - 0  Feeling bad or failure about yourself  1 1 - 1  Trouble concentrating 2 0 - 0  Moving slowly or fidgety/restless 0 0 - 1  Suicidal thoughts 0 0 - 0  PHQ-9 Score 7 6 - 5  Difficult doing work/chores Somewhat difficult Somewhat difficult - Somewhat difficult     Review of Systems  Constitutional: Negative.   HENT: Negative.   Eyes: Negative.   Respiratory: Positive for shortness of breath.   Cardiovascular: Negative.   Gastrointestinal: Negative.   Endocrine: Negative.    Genitourinary: Negative.   Skin: Negative.   Allergic/Immunologic: Negative.   Neurological: Negative.   Hematological: Bruises/bleeds easily.  Psychiatric/Behavioral: Negative.   All other systems reviewed and are negative.      Objective:   Physical Exam  Constitutional: He is oriented to person, place, and time. He appears well-developed and well-nourished.  HENT:  Head: Normocephalic and atraumatic.  Eyes: Conjunctivae and EOM are normal. Pupils are equal, round, and reactive to light.  Neck: Normal range of motion.  Neurological: He is alert and oriented to person, place, and time.  Psychiatric: He has a normal mood and affect.  Nursing note and vitals reviewed.   Patient has mild to moderate dysmetria with right finger-nose-finger Motor strength is 4/5 in the right deltoid bias, triceps, grip, hip flexor, knee extensor, 3 minus. Ankle dorsiflexor. 5/5 in left upper and lower limb. Sit to stand is with supervision to min assist. Without rolling walker       Assessment & Plan:  1. Left thalamic infarct. He's also had hippocampal infarct, although this had a smaller impractical on his overall functional status. He is starting outpatient therapy. We'll return in 6 weeks to follow-up from this round of outpatient therapy  2. Right knee pain, history of osteoarthritis, no injection today given that it has been less than 3 months. We'll reevaluate in 6 weeks. May need injection at that time. If he starts developing increasing pain over the next couple weeks, wife can call to make an appointment

## 2016-08-27 ENCOUNTER — Ambulatory Visit: Payer: Medicare Other | Admitting: Physical Therapy

## 2016-08-27 ENCOUNTER — Encounter: Payer: Self-pay | Admitting: Physical Therapy

## 2016-08-27 DIAGNOSIS — R2689 Other abnormalities of gait and mobility: Secondary | ICD-10-CM | POA: Diagnosis not present

## 2016-08-27 DIAGNOSIS — M6281 Muscle weakness (generalized): Secondary | ICD-10-CM

## 2016-08-27 DIAGNOSIS — R2681 Unsteadiness on feet: Secondary | ICD-10-CM | POA: Diagnosis not present

## 2016-08-27 NOTE — Therapy (Signed)
[facility] [address] [place], [place], [address] Phone: [phone]   Fax:  [phone]  Physical Therapy Treatment  Patient Details  Name: Herbert Marquez MRN: [id number] Date of Birth: [birth year] Referring Provider: Alysia Penna, MD  Encounter Date: 08/27/2016      PT End of Session - 08/27/16 1957    Visit Number 2   Number of Visits 16   Date for PT Re-Evaluation 10/13/16   Authorization Type G code every 10th visit   PT Start Time 1402   PT Stop Time 1445   PT Time Calculation (min) 43 min   Equipment Utilized During Treatment Gait belt   Activity Tolerance Patient tolerated treatment well   Behavior During Therapy [facility] for tasks assessed/performed      Past Medical History:  Diagnosis Date  . Arthritis    "pretty much all over"   . Basal cell carcinoma    "several burned off his body, face, head"  . BPH (benign prostatic hypertrophy)   . Coronary artery disease    a. s/p PCI of RCA in 2006  . CVA (cerebral infarction)    a. 06/2015: left thalamic and bilateral PCA  . GERD (gastroesophageal reflux disease)   . Hyperlipidemia   . Hypertension   . TIA (transient ischemic attack)    Approximately 6 weeks post-cardiac catheterization.   . Type II diabetes mellitus ([place])    "prediabetic; lost alot of weight; not diabetic now" (06/16/2015)    Past Surgical History:  Procedure Laterality Date  . CARDIOVASCULAR STRESS TEST  07/01/2007   EF 74%  . CATARACT EXTRACTION, BILATERAL    . CORONARY ANGIOPLASTY WITH STENT PLACEMENT  10/2004   stenting x 2 to RCA  . FEMUR IM NAIL Right 11/26/2015   Procedure: INTRAMEDULLARY RIGHT (IM) NAIL FEMORAL;  Surgeon: Rod Can, MD;  Location: [facility];  Service: Orthopedics;  Laterality: Right;  . HERNIA REPAIR    . HIP ARTHROPLASTY  03/09/2011   Procedure: ARTHROPLASTY BIPOLAR HIP;  Surgeon: Mauri Pole;  Location: [facility];  Service: Orthopedics;  Laterality:  Left;  . LAPAROSCOPIC INCISIONAL / UMBILICAL / VENTRAL HERNIA REPAIR     "below his naval"    There were no vitals filed for this visit.      Subjective Assessment - 08/27/16 1359    Subjective No changes since seen last. Patient very HOH   Pertinent History L hip replace, R hip fracture, h/o multiple CVAs, vascular dementia.   Patient Stated Goals Trying to get back to walking.   Currently in Pain? Yes   Pain Score --  usual aches and pains   Pain Location Knee   Pain Orientation Right   Pain Descriptors / Indicators Aching   Pain Type Chronic pain   Pain Onset More than a month ago   Pain Frequency Intermittent                         [facility] Adult PT Treatment/Exercise - 08/27/16 1950      Bed Mobility   Bed Mobility Supine to Sit;Sit to Supine   Supine to Sit 6: Modified independent (Device/Increase time)   Sit to Supine 6: Modified independent (Device/Increase time)     Transfers   Transfers Sit to Stand;Stand to Sit;Stand Pivot Transfers   Sit to Stand 4: Min guard   Stand to Sit 4: Min guard   Stand Pivot Transfers 4: Min assist  Transfer Cueing cues needed only when in // bars to remind pt ot push up from chair     Ambulation/Gait   Ambulation/Gait Assistance 4: Min assist   Ambulation/Gait Assistance Details Rt knee brace; assist to incr hip extension and upright posture over stance limb   Ambulation Distance (Feet) 16 Feet  16, 45, 45   Assistive device Rolling walker;Parallel bars   Gait Pattern Step-through pattern;Decreased step length - left;Decreased stance time - right;Decreased weight shift to right;Right flexed knee in stance;Trunk flexed   Ambulation Surface Level   Pre-Gait Activities ant-post wtshift in // bars with each foot forward     Exercises   Exercises Lumbar;Knee/Hip     Lumbar Exercises: Stretches   Active Hamstring Stretch 30 seconds;1 rep   Active Hamstring Stretch Limitations seated   Passive Hamstring Stretch 1  rep;30 seconds   Passive Hamstring Stretch Limitations supine; pt/wife indicate they already do this at home     Lumbar Exercises: Supine   Bridge 10 reps;3 seconds   Bridge Limitations vc for control of RLE                PT Education - 08/27/16 1956    Education provided Yes   Education Details addition to HEP; bring in his HEP from [facility] for updating   Person(s) Educated Patient;Spouse   Methods Explanation;Demonstration;Tactile cues;Verbal cues;Handout   Comprehension Verbalized understanding;Returned demonstration;Verbal cues required;Tactile cues required;Need further instruction          PT Short Term Goals - 08/14/16 2107      PT SHORT TERM GOAL #1   Title The patient will perform HEP with assist from wife as needed for LE strengthening, standing balance and general mobility.  TARGET DATE FOR ALL STGs:  09/13/16   Time 4   Period Weeks     PT SHORT TERM GOAL #2   Title The patient will improve Berg score from 11/56 to > or equal to 20/56 to demo improving ability stand without UE support for ADLs.   Time 4   Period Weeks     PT SHORT TERM GOAL #3   Title The patient will improve gait speed from 0.65 ft/sec to > or equal to 1.3 ft/sec to demo improved household ambulation (with least restrictive assistive device).   Time 4   Period Weeks     PT SHORT TERM GOAL #4   Title The patient will perform sit<>stand with UE support modified indep.   Time 4   Period Weeks     PT SHORT TERM GOAL #5   Title The patient will be furthe assessed on stairs and LTG to follow.   Time 4   Period Weeks     Additional Short Term Goals   Additional Short Term Goals Yes     PT SHORT TERM GOAL #6   Title The patient will ambulate for 150 ft with least restrictive device modified indep.   Time 8   Period Weeks           PT Long Term Goals - 08/14/16 2111      PT LONG TERM GOAL #1   Title The patient will be indep with progression of HEP.  TARGET DATE FOR ALL LTGs:   10/14/16   Time 8   Period Weeks     PT LONG TERM GOAL #2   Title The patient will improve Berg score from 11/56 to > or equal to 30/56 to demo improving balance for ADLs.  Time 8   Period Weeks     PT LONG TERM GOAL #3   Title The patient will improve gait speed from 0.65 ft/sec to > or equal to 1.6 ft/sec to demo improving mobility.   Time 8   Period Weeks     PT LONG TERM GOAL #4   Title The patient will move sit<>stand x 5 times with UE support modified indep without use of device to stabilize upon rising to demo improving balance during transitional movements.   Time 8   Period Weeks     PT LONG TERM GOAL #5   Title The patient will ambulate x 400 ft nonstop with least restrictive assistive device modified indep for improved assess to home/limited community surfaces.   Time 8   Period Weeks     Additional Long Term Goals   Additional Long Term Goals Yes     PT LONG TERM GOAL #6   Title LTG to be written for stair negotiation.               Plan - 08/27/16 1958    Clinical Impression Statement session focused on pre-gait and gait training to improve safety and decrease risk of falling. Patient very motivated and wife very attentive/supportive. Anticipate steady progress.    Rehab Potential Good   Clinical Impairments Affecting Rehab Potential Barriers to rehab include multiple CVAs, diminished sensation.  Enhancers to rehab potential include family support, patient motivation.   PT Frequency 2x / week   PT Duration 8 weeks   PT Treatment/Interventions ADLs/Self Care Home Management;Therapeutic activities;Therapeutic exercise;Neuromuscular re-education;Patient/family education;Functional mobility training;Gait training;DME Instruction;Stair training;Manual techniques;Orthotic Fit/Training;Wheelchair mobility training   PT Next Visit Plan Establish HEP:  (asked them to bring his HEP from [facility] for updating); LE strengthening, standing balance near support surface  dec'ing UE support.  Patient has supportive spouse to help with HEP as needed.  Gait activities in standing emphasizing R knee extension at heel strike to mid stance.   Consulted and Agree with Plan of Care Patient;Family member/caregiver   Family Member Consulted spouse      Patient will benefit from skilled therapeutic intervention in order to improve the following deficits and impairments:  Abnormal gait, Decreased balance, Difficulty walking, Decreased strength, Decreased mobility, Impaired sensation, Postural dysfunction, Impaired flexibility, Pain  Visit Diagnosis: Other abnormalities of gait and mobility  Muscle weakness (generalized)  Unsteadiness on feet     Problem List Patient Active Problem List   Diagnosis Date Noted  . Restlessness and agitation 07/02/2016  . Vascular dementia with behavior disturbance 06/29/2016  . Hemiparesis and other late effects of cerebrovascular accident ([place]) 06/29/2016  . Embolic stroke ([place]) 53/29/9242  . Right hemiparesis ([place])   . History of CVA with residual deficit   . Chronic anticoagulation   . Atrial fibrillation with rapid ventricular response ([place])   . Seizure prophylaxis   . Diastolic dysfunction   . Bacteremia due to Gram-positive bacteria   . Altered mental status   . Dementia without behavioral disturbance   . Leukocytosis   . Acute blood loss anemia   . Acute encephalopathy 06/04/2016  . PAF (paroxysmal atrial fibrillation) ([place])   . Iron deficiency anemia 04/04/2016  . History of CVA (cerebrovascular accident) 04/04/2016  . Cerebral hemorrhage (HCC) w/ SDH s/p IV tPA   . Coronary artery disease involving native coronary artery of native heart without angina pectoris   . Orthostatic hypotension   . History of right hip replacement   .  Acute ischemic stroke ([place]) - L temporal lobe s/p tPA 03/30/2016  . BPH (benign prostatic hyperplasia) 11/25/2015  . GERD (gastroesophageal reflux disease) 11/25/2015  . CKD (chronic  kidney disease), stage II 11/25/2015  . Overactive bladder   . Cerebrovascular accident (CVA) ([place]) 09/18/2015  . Gait disturbance, post-stroke 06/23/2015  . 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 Thalamic infarction ([place]) 06/21/2015  . Benign essential HTN   . Type 2 diabetes mellitus with complication, without long-term current use of insulin ([place])   . Dysphagia as late effect of cerebrovascular disease   . Hyponatremia   . HLD (hyperlipidemia)   . Obesity 03/09/2011  . COPD (chronic obstructive pulmonary disease) ([place]) 03/09/2011    Rexanne Mano, PT 08/27/2016, 8:00 PM  [facility] [address] [place], [place], [address] Phone: [phone]   Fax:  [phone]  Name: Herbert Marquez MRN: [id number] Date of Birth: [birth year]

## 2016-08-27 NOTE — Patient Instructions (Signed)
Bracing With Bridging     With feet and knees apart, tighten your abdominals and lift bottom. Hold for 3 counts. Slowly lower. Try to keep your knees and body as still as possible. Repeat __5-10_ times. Do _2__ times a day.   Copyright  VHI. All rights reserved.   Knee Extension (Hamstring Stretch) With Pelvis Neutral    Sit on front edge of seat. Put one leg out straight, putting your heel down and pulling your toes towards your face. Slowly and gently bend forward at your hips.  Hold _30__ seconds. Do _2__ times, each leg, __1-2_ times per day.  http://ss.exer.us/88   Copyright  VHI. All rights reserved.

## 2016-08-31 ENCOUNTER — Ambulatory Visit: Payer: Medicare Other | Admitting: Physical Therapy

## 2016-08-31 ENCOUNTER — Encounter: Payer: Self-pay | Admitting: Physical Therapy

## 2016-08-31 DIAGNOSIS — R2689 Other abnormalities of gait and mobility: Secondary | ICD-10-CM | POA: Diagnosis not present

## 2016-08-31 DIAGNOSIS — R2681 Unsteadiness on feet: Secondary | ICD-10-CM | POA: Diagnosis not present

## 2016-08-31 DIAGNOSIS — M6281 Muscle weakness (generalized): Secondary | ICD-10-CM

## 2016-08-31 NOTE — Therapy (Signed)
[facility] [address] [place], [place], [address] Phone: [phone]   Fax:  [phone]  Physical Therapy Treatment  Patient Details  Name: Herbert Marquez MRN: [id number] Date of Birth: 1927/12/18 Referring Provider: Alysia Penna, MD  Encounter Date: 08/31/2016      PT End of Session - 08/31/16 1728    Visit Number 3   Number of Visits 16   Date for PT Re-Evaluation 10/13/16   Authorization Type G code every 10th visit   PT Start Time 1315   PT Stop Time 1400   PT Time Calculation (min) 45 min   Equipment Utilized During Treatment Gait belt   Activity Tolerance Patient tolerated treatment well   Behavior During Therapy [facility] for tasks assessed/performed      Past Medical History:  Diagnosis Date  . Arthritis    "pretty much all over"   . Basal cell carcinoma    "several burned off his body, face, head"  . BPH (benign prostatic hypertrophy)   . Coronary artery disease    a. s/p PCI of RCA in 2006  . CVA (cerebral infarction)    a. 06/2015: left thalamic and bilateral PCA  . GERD (gastroesophageal reflux disease)   . Hyperlipidemia   . Hypertension   . TIA (transient ischemic attack)    Approximately 6 weeks post-cardiac catheterization.   . Type II diabetes mellitus ([place])    "prediabetic; lost alot of weight; not diabetic now" (06/16/2015)    Past Surgical History:  Procedure Laterality Date  . CARDIOVASCULAR STRESS TEST  07/01/2007   EF 74%  . CATARACT EXTRACTION, BILATERAL    . CORONARY ANGIOPLASTY WITH STENT PLACEMENT  10/2004   stenting x 2 to RCA  . FEMUR IM NAIL Right 11/26/2015   Procedure: INTRAMEDULLARY RIGHT (IM) NAIL FEMORAL;  Surgeon: Rod Can, MD;  Location: [facility];  Service: Orthopedics;  Laterality: Right;  . HERNIA REPAIR    . HIP ARTHROPLASTY  03/09/2011   Procedure: ARTHROPLASTY BIPOLAR HIP;  Surgeon: Mauri Pole;  Location: [facility];  Service: Orthopedics;  Laterality:  Left;  . LAPAROSCOPIC INCISIONAL / UMBILICAL / VENTRAL HERNIA REPAIR     "below his naval"    There were no vitals filed for this visit.      Subjective Assessment - 08/31/16 1315    Subjective Rt knee is hurting him this morning. Took something for pain before he came for therapy.    Pertinent History L hip replace, R hip fracture, h/o multiple CVAs, vascular dementia.   Patient Stated Goals Trying to get back to walking.   Currently in Pain? Yes   Pain Score 2    Pain Location Knee   Pain Orientation Right   Pain Descriptors / Indicators Aching   Pain Type Chronic pain   Pain Onset More than a month ago                         [facility] Adult PT Treatment/Exercise - 08/31/16 0001      Bed Mobility   Supine to Sit 6: Modified independent (Device/Increase time)   Sit to Supine 6: Modified independent (Device/Increase time)     Transfers   Transfers Sit to Stand;Stand to [employer] Transfers   Sit to Stand 4: Min guard;With upper extremity assist   Stand to Sit 4: Min guard   Stand Pivot Transfers 4: Min guard   Stand Pivot Transfer Details (indicate  cue type and reason) as previously; pt reached for PT's arm as he begain to pivot to chair     Ambulation/Gait   Ambulation/Gait Assistance 4: Min guard   Ambulation/Gait Assistance Details vc and facilitaiton to incr wtshift and wt on RLE   Ambulation Distance (Feet) 35 Feet  16x4 // bars   Assistive device Rolling walker;Parallel bars   Gait Pattern Step-through pattern;Decreased step length - left;Decreased stance time - right;Decreased weight shift to right;Right flexed knee in stance;Trunk flexed   Ambulation Surface Level     Knee/Hip Exercises: Standing   Heel Raises Both;10 reps   Heel Raises Limitations toe walking x 16 ft // bars   Hip Abduction Stengthening;Right;1 set;10 reps   Abduction Limitations pt could not lift LLE do to not trusting RLE to support him   Other Standing Knee Exercises  sideways walking at // bar x 2 lengths     Knee/Hip Exercises: Seated   Clamshell with TheraBand Green   Marching Limitations with green band     Knee/Hip Exercises: Supine   Terminal Knee Extension Limitations lying supine with pillow under distal calf RLE to promote rt knee extension x 5 minutes (while HEP created)                PT Education - 08/31/16 1743    Education provided Yes   Education Details additions to current HEP    Person(s) Educated Patient;Spouse   Methods Explanation;Demonstration;Verbal cues;Handout   Comprehension Verbalized understanding;Returned demonstration;Need further instruction          PT Short Term Goals - 08/14/16 2107      PT SHORT TERM GOAL #1   Title The patient will perform HEP with assist from wife as needed for LE strengthening, standing balance and general mobility.  TARGET DATE FOR ALL STGs:  09/13/16   Time 4   Period Weeks     PT SHORT TERM GOAL #2   Title The patient will improve Berg score from 11/56 to > or equal to 20/56 to demo improving ability stand without UE support for ADLs.   Time 4   Period Weeks     PT SHORT TERM GOAL #3   Title The patient will improve gait speed from 0.65 ft/sec to > or equal to 1.3 ft/sec to demo improved household ambulation (with least restrictive assistive device).   Time 4   Period Weeks     PT SHORT TERM GOAL #4   Title The patient will perform sit<>stand with UE support modified indep.   Time 4   Period Weeks     PT SHORT TERM GOAL #5   Title The patient will be furthe assessed on stairs and LTG to follow.   Time 4   Period Weeks     Additional Short Term Goals   Additional Short Term Goals Yes     PT SHORT TERM GOAL #6   Title The patient will ambulate for 150 ft with least restrictive device modified indep.   Time 8   Period Weeks           PT Long Term Goals - 08/14/16 2111      PT LONG TERM GOAL #1   Title The patient will be indep with progression of HEP.   TARGET DATE FOR ALL LTGs:  10/14/16   Time 8   Period Weeks     PT LONG TERM GOAL #2   Title The patient will improve Berg score from 11/56  to > or equal to 30/56 to demo improving balance for ADLs.    Time 8   Period Weeks     PT LONG TERM GOAL #3   Title The patient will improve gait speed from 0.65 ft/sec to > or equal to 1.6 ft/sec to demo improving mobility.   Time 8   Period Weeks     PT LONG TERM GOAL #4   Title The patient will move sit<>stand x 5 times with UE support modified indep without use of device to stabilize upon rising to demo improving balance during transitional movements.   Time 8   Period Weeks     PT LONG TERM GOAL #5   Title The patient will ambulate x 400 ft nonstop with least restrictive assistive device modified indep for improved assess to home/limited community surfaces.   Time 8   Period Weeks     Additional Long Term Goals   Additional Long Term Goals Yes     PT LONG TERM GOAL #6   Title LTG to be written for stair negotiation.               Plan - 08/31/16 1729    Clinical Impression Statement Session focused on gait and balance training as well as increasing Rt knee ROM (lacks ~15 degrees of extension). Continue to progress towards goals   Rehab Potential Good   Clinical Impairments Affecting Rehab Potential Barriers to rehab include multiple CVAs, diminished sensation.  Enhancers to rehab potential include family support, patient motivation.   PT Frequency 2x / week   PT Duration 8 weeks   PT Treatment/Interventions ADLs/Self Care Home Management;Therapeutic activities;Therapeutic exercise;Neuromuscular re-education;Patient/family education;Functional mobility training;Gait training;DME Instruction;Stair training;Manual techniques;Orthotic Fit/Training;Wheelchair mobility training   PT Next Visit Plan LE strengthening, standing balance near support surface dec'ing UE support.  Patient has supportive spouse to help with HEP as needed.   Gait activities in standing emphasizing R knee extension at heel strike to mid stance.   Consulted and Agree with Plan of Care Patient;Family member/caregiver   Family Member Consulted spouse      Patient will benefit from skilled therapeutic intervention in order to improve the following deficits and impairments:  Abnormal gait, Decreased balance, Difficulty walking, Decreased strength, Decreased mobility, Impaired sensation, Postural dysfunction, Impaired flexibility, Pain  Visit Diagnosis: Other abnormalities of gait and mobility  Muscle weakness (generalized)  Unsteadiness on feet     Problem List Patient Active Problem List   Diagnosis Date Noted  . Restlessness and agitation 07/02/2016  . Vascular dementia with behavior disturbance 06/29/2016  . Hemiparesis and other late effects of cerebrovascular accident ([place]) 06/29/2016  . Embolic stroke ([place]) 78/58/8502  . Right hemiparesis ([place])   . History of CVA with residual deficit   . Chronic anticoagulation   . Atrial fibrillation with rapid ventricular response ([place])   . Seizure prophylaxis   . Diastolic dysfunction   . Bacteremia due to Gram-positive bacteria   . Altered mental status   . Dementia without behavioral disturbance   . Leukocytosis   . Acute blood loss anemia   . Acute encephalopathy 06/04/2016  . PAF (paroxysmal atrial fibrillation) ([place])   . Iron deficiency anemia 04/04/2016  . History of CVA (cerebrovascular accident) 04/04/2016  . Cerebral hemorrhage (HCC) w/ SDH s/p IV tPA   . Coronary artery disease involving native coronary artery of native heart without angina pectoris   . Orthostatic hypotension   . History of right hip replacement   .  Acute ischemic stroke ([place]) - L temporal lobe s/p tPA 03/30/2016  . BPH (benign prostatic hyperplasia) 11/25/2015  . GERD (gastroesophageal reflux disease) 11/25/2015  . CKD (chronic kidney disease), stage II 11/25/2015  . Overactive bladder   . Cerebrovascular  accident (CVA) ([place]) 09/18/2015  . Gait disturbance, post-stroke 06/23/2015  . 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 Thalamic infarction ([place]) 06/21/2015  . Benign essential HTN   . Type 2 diabetes mellitus with complication, without long-term current use of insulin ([place])   . Dysphagia as late effect of cerebrovascular disease   . Hyponatremia   . HLD (hyperlipidemia)   . Obesity 03/09/2011  . COPD (chronic obstructive pulmonary disease) ([place]) 03/09/2011    Rexanne Mano, PT 08/31/2016, 5:44 PM  [facility] [address] [place], [place], [address] Phone: [phone]   Fax:  [phone]  Name: NAVDEEP HALT MRN: [id number] Date of Birth: Aug 20, 1927

## 2016-08-31 NOTE — Patient Instructions (Addendum)
Patient brought in handout from [facility] to review his current HEP: Seated: Ankle pumps LAQ Marching (added green band) Hip abdct/adduction (added green band) Seated sit-ups  Supine:  SLR Hamstring stretch (wife assists) Pension scheme manager  Standing: Heel raises Tandem stance Sideways walk     Extension: Isometric With Knee Extended (Supine)    Position Place pillow or towel roll under right ankle (with heel hanging off the pillow).Make sure knee remains straight. Hold __5 up to 30 minutes_ Repeat _2__ times per day.   Copyright  VHI. All rights reserved.

## 2016-09-12 ENCOUNTER — Ambulatory Visit
Payer: Medicare Other | Attending: Physical Medicine & Rehabilitation | Admitting: Rehabilitative and Restorative Service Providers"

## 2016-09-12 DIAGNOSIS — M6281 Muscle weakness (generalized): Secondary | ICD-10-CM | POA: Diagnosis not present

## 2016-09-12 DIAGNOSIS — R2681 Unsteadiness on feet: Secondary | ICD-10-CM | POA: Insufficient documentation

## 2016-09-12 DIAGNOSIS — R2689 Other abnormalities of gait and mobility: Secondary | ICD-10-CM | POA: Insufficient documentation

## 2016-09-12 NOTE — Therapy (Signed)
[facility] [address] [place], [place], [address] Phone: [phone]   Fax:  [phone]  Physical Therapy Treatment  Patient Details  Name: Herbert Marquez MRN: [id number] Date of Birth: 1927-08-12 Referring Provider: Alysia Penna, MD  Encounter Date: 09/12/2016      PT End of Session - 09/12/16 1734    Visit Number 4   Number of Visits 16   Date for PT Re-Evaluation 10/13/16   Authorization Type G code every 10th visit   PT Start Time 1106   PT Stop Time 1150   PT Time Calculation (min) 44 min   Equipment Utilized During Treatment Gait belt   Activity Tolerance Patient tolerated treatment well   Behavior During Therapy [facility] for tasks assessed/performed      Past Medical History:  Diagnosis Date  . Arthritis    "pretty much all over"   . Basal cell carcinoma    "several burned off his body, face, head"  . BPH (benign prostatic hypertrophy)   . Coronary artery disease    a. s/p PCI of RCA in 2006  . CVA (cerebral infarction)    a. 06/2015: left thalamic and bilateral PCA  . GERD (gastroesophageal reflux disease)   . Hyperlipidemia   . Hypertension   . TIA (transient ischemic attack)    Approximately 6 weeks post-cardiac catheterization.   . Type II diabetes mellitus ([place])    "prediabetic; lost alot of weight; not diabetic now" (06/16/2015)    Past Surgical History:  Procedure Laterality Date  . CARDIOVASCULAR STRESS TEST  07/01/2007   EF 74%  . CATARACT EXTRACTION, BILATERAL    . CORONARY ANGIOPLASTY WITH STENT PLACEMENT  10/2004   stenting x 2 to RCA  . FEMUR IM NAIL Right 11/26/2015   Procedure: INTRAMEDULLARY RIGHT (IM) NAIL FEMORAL;  Surgeon: Rod Can, MD;  Location: [facility];  Service: Orthopedics;  Laterality: Right;  . HERNIA REPAIR    . HIP ARTHROPLASTY  03/09/2011   Procedure: ARTHROPLASTY BIPOLAR HIP;  Surgeon: Mauri Pole;  [facility]: [facility];  Service: Orthopedics;  Laterality:  Left;  . LAPAROSCOPIC INCISIONAL / UMBILICAL / VENTRAL HERNIA REPAIR     "below his naval"    There were no vitals filed for this visit.      Subjective Assessment - 09/12/16 1106    Subjective The patient reports he took tylenol this morning for his knee.  He was able to go to the beach last week with assist from family for mobility.   Patient Stated Goals Trying to get back to walking.   Currently in Pain? Yes   Pain Score 0-No pain  at rest, with weight bearing notes pain.   Pain Location Knee   Pain Orientation Right   Pain Descriptors / Indicators Aching   Pain Type Chronic pain   Pain Onset More than a month ago   Pain Frequency Intermittent   Aggravating Factors  PT to monitor response to treatment- no goal to follow due to nature of referral.                         [facility] Adult PT Treatment/Exercise - 09/12/16 1112      Transfers   Transfers Sit to Stand;Stand to Sit   Sit to Stand 4: Min guard;With upper extremity assist   Stand to Sit 4: Min guard     Ambulation/Gait   Ambulation/Gait --   Ambulation/Gait Assistance 4:  Min guard   Ambulation/Gait Assistance Details verbal cues for increased reciprocal pattern with RW, increased weight bearing through R and heel strike R.   Ambulation Distance (Feet) 115 Feet  85, 50 x 2 reps.   Assistive device Rolling walker   Gait Pattern Step-through pattern;Decreased step length - left;Decreased stance time - right;Decreased weight shift to right;Right flexed knee in stance;Trunk flexed   Ambulation Surface Level     Neuro Re-ed    Neuro Re-ed Details  Standing right LE weight shifting with UE support and tactile cues for R knee extension moving L LE into/out of stride position.   Standing gluteal sets to emphasize upright posture with dec'ing UE support.  Anterior/posterior weight shift emphasizing hip strategy dec'ing UE support.                   PT Short Term Goals - 08/14/16 2107      PT  SHORT TERM GOAL #1   Title The patient will perform HEP with assist from wife as needed for LE strengthening, standing balance and general mobility.  TARGET DATE FOR ALL STGs:  09/13/16   Time 4   Period Weeks     PT SHORT TERM GOAL #2   Title The patient will improve Berg score from 11/56 to > or equal to 20/56 to demo improving ability stand without UE support for ADLs.   Time 4   Period Weeks     PT SHORT TERM GOAL #3   Title The patient will improve gait speed from 0.65 ft/sec to > or equal to 1.3 ft/sec to demo improved household ambulation (with least restrictive assistive device).   Time 4   Period Weeks     PT SHORT TERM GOAL #4   Title The patient will perform sit<>stand with UE support modified indep.   Time 4   Period Weeks     PT SHORT TERM GOAL #5   Title The patient will be furthe assessed on stairs and LTG to follow.   Time 4   Period Weeks     Additional Short Term Goals   Additional Short Term Goals Yes     PT SHORT TERM GOAL #6   Title The patient will ambulate for 150 ft with least restrictive device modified indep.   Time 8   Period Weeks           PT Long Term Goals - 08/14/16 2111      PT LONG TERM GOAL #1   Title The patient will be indep with progression of HEP.  TARGET DATE FOR ALL LTGs:  10/14/16   Time 8   Period Weeks     PT LONG TERM GOAL #2   Title The patient will improve Berg score from 11/56 to > or equal to 30/56 to demo improving balance for ADLs.    Time 8   Period Weeks     PT LONG TERM GOAL #3   Title The patient will improve gait speed from 0.65 ft/sec to > or equal to 1.6 ft/sec to demo improving mobility.   Time 8   Period Weeks     PT LONG TERM GOAL #4   Title The patient will move sit<>stand x 5 times with UE support modified indep without use of device to stabilize upon rising to demo improving balance during transitional movements.   Time 8   Period Weeks     PT LONG TERM GOAL #5   Title The  patient will ambulate  x 400 ft nonstop with least restrictive assistive device modified indep for improved assess to home/limited community surfaces.   Time 8   Period Weeks     Additional Long Term Goals   Additional Long Term Goals Yes     PT LONG TERM GOAL #6   Title LTG to be written for stair negotiation.               Plan - 09/12/16 1735    Clinical Impression Statement PT emphasized extended ambulation for endurance and strength, and stance phase of gait working on R knee extension with tactile cues.  Patient tolerated activities well. Plan to check STGs at next visit.    PT Treatment/Interventions ADLs/Self Care Home Management;Therapeutic activities;Therapeutic exercise;Neuromuscular re-education;Patient/family education;Functional mobility training;Gait training;DME Instruction;Stair training;Manual techniques;Orthotic Fit/Training;Wheelchair mobility training   PT Next Visit Plan ** check STGs*LE strengthening, standing balance near support surface dec'ing UE support.  Gait activities in standing emphasizing R knee extension at heel strike to mid stance.   Consulted and Agree with Plan of Care Patient;Family member/caregiver   Family Member Consulted spouse      Patient will benefit from skilled therapeutic intervention in order to improve the following deficits and impairments:  Abnormal gait, Decreased balance, Difficulty walking, Decreased strength, Decreased mobility, Impaired sensation, Postural dysfunction, Impaired flexibility, Pain  Visit Diagnosis: Other abnormalities of gait and mobility  Muscle weakness (generalized)  Unsteadiness on feet     Problem List Patient Active Problem List   Diagnosis Date Noted  . Restlessness and agitation 07/02/2016  . Vascular dementia with behavior disturbance 06/29/2016  . Hemiparesis and other late effects of cerebrovascular accident ([place]) 06/29/2016  . Embolic stroke ([place]) 65/78/4696  . Right hemiparesis ([place])   . History of CVA with  residual deficit   . Chronic anticoagulation   . Atrial fibrillation with rapid ventricular response ([place])   . Seizure prophylaxis   . Diastolic dysfunction   . Bacteremia due to Gram-positive bacteria   . Altered mental status   . Dementia without behavioral disturbance   . Leukocytosis   . Acute blood loss anemia   . Acute encephalopathy 06/04/2016  . PAF (paroxysmal atrial fibrillation) ([place])   . Iron deficiency anemia 04/04/2016  . History of CVA (cerebrovascular accident) 04/04/2016  . Cerebral hemorrhage (HCC) w/ SDH s/p IV tPA   . Coronary artery disease involving native coronary artery of native heart without angina pectoris   . Orthostatic hypotension   . History of right hip replacement   . Acute ischemic stroke ([place]) - L temporal lobe s/p tPA 03/30/2016  . BPH (benign prostatic hyperplasia) 11/25/2015  . GERD (gastroesophageal reflux disease) 11/25/2015  . CKD (chronic kidney disease), stage II 11/25/2015  . Overactive bladder   . Cerebrovascular accident (CVA) ([place]) 09/18/2015  . Gait disturbance, post-stroke 06/23/2015  . 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 Thalamic infarction ([place]) 06/21/2015  . Benign essential HTN   . Type 2 diabetes mellitus with complication, without long-term current use of insulin ([name])   . Dysphagia as late effect of cerebrovascular disease   . Hyponatremia   . HLD (hyperlipidemia)   . Obesity 03/09/2011  . COPD (chronic obstructive pulmonary disease) ([place]) 03/09/2011    Berit Raczkowski , PT 09/12/2016, 10:26 PM  [facility] [address] [place], [place], [address] Phone: [phone]   Fax:  [phone]  Name: Herbert Marquez MRN: [id number] Date of Birth: 1927/10/29

## 2016-09-13 DIAGNOSIS — L2089 Other atopic dermatitis: Secondary | ICD-10-CM | POA: Diagnosis not present

## 2016-09-13 DIAGNOSIS — L821 Other seborrheic keratosis: Secondary | ICD-10-CM | POA: Diagnosis not present

## 2016-09-13 DIAGNOSIS — Z85828 Personal history of other malignant neoplasm of skin: Secondary | ICD-10-CM | POA: Diagnosis not present

## 2016-09-13 DIAGNOSIS — L918 Other hypertrophic disorders of the skin: Secondary | ICD-10-CM | POA: Diagnosis not present

## 2016-09-13 DIAGNOSIS — L578 Other skin changes due to chronic exposure to nonionizing radiation: Secondary | ICD-10-CM | POA: Diagnosis not present

## 2016-09-13 DIAGNOSIS — D692 Other nonthrombocytopenic purpura: Secondary | ICD-10-CM | POA: Diagnosis not present

## 2016-09-13 DIAGNOSIS — L720 Epidermal cyst: Secondary | ICD-10-CM | POA: Diagnosis not present

## 2016-09-14 ENCOUNTER — Ambulatory Visit: Payer: Medicare Other | Admitting: Rehabilitative and Restorative Service Providers"

## 2016-09-14 DIAGNOSIS — R2689 Other abnormalities of gait and mobility: Secondary | ICD-10-CM | POA: Diagnosis not present

## 2016-09-14 DIAGNOSIS — M6281 Muscle weakness (generalized): Secondary | ICD-10-CM

## 2016-09-14 DIAGNOSIS — R2681 Unsteadiness on feet: Secondary | ICD-10-CM | POA: Diagnosis not present

## 2016-09-14 NOTE — Therapy (Signed)
[facility] [address] [place], [place], [address] Phone: [phone]   Fax:  [phone]  Physical Therapy Treatment  Patient Details  Name: Herbert Marquez MRN: [id number] Date of Birth: 1928/01/03 Referring Provider: Alysia Penna, MD  Encounter Date: 09/14/2016      PT End of Session - 09/14/16 1151    Visit Number 5   Number of Visits 16   Date for PT Re-Evaluation 10/13/16   Authorization Type G code every 10th visit   PT Start Time 1105   PT Stop Time 1150   PT Time Calculation (min) 45 min   Equipment Utilized During Treatment Gait belt   Activity Tolerance Patient tolerated treatment well   Behavior During Therapy [facility] for tasks assessed/performed      Past Medical History:  Diagnosis Date  . Arthritis    "pretty much all over"   . Basal cell carcinoma    "several burned off his body, face, head"  . BPH (benign prostatic hypertrophy)   . Coronary artery disease    a. s/p PCI of RCA in 2006  . CVA (cerebral infarction)    a. 06/2015: left thalamic and bilateral PCA  . GERD (gastroesophageal reflux disease)   . Hyperlipidemia   . Hypertension   . TIA (transient ischemic attack)    Approximately 6 weeks post-cardiac catheterization.   . Type II diabetes mellitus ([place])    "prediabetic; lost alot of weight; not diabetic now" (06/16/2015)    Past Surgical History:  Procedure Laterality Date  . CARDIOVASCULAR STRESS TEST  07/01/2007   EF 74%  . CATARACT EXTRACTION, BILATERAL    . CORONARY ANGIOPLASTY WITH STENT PLACEMENT  10/2004   stenting x 2 to RCA  . FEMUR IM NAIL Right 11/26/2015   Procedure: INTRAMEDULLARY RIGHT (IM) NAIL FEMORAL;  Surgeon: Rod Can, MD;  Location: [facility];  Service: Orthopedics;  Laterality: Right;  . HERNIA REPAIR    . HIP ARTHROPLASTY  03/09/2011   Procedure: ARTHROPLASTY BIPOLAR HIP;  Surgeon: Mauri Pole;  Location: [facility];  Service: Orthopedics;  Laterality:  Left;  . LAPAROSCOPIC INCISIONAL / UMBILICAL / VENTRAL HERNIA REPAIR     "below his naval"    There were no vitals filed for this visit.      Subjective Assessment - 09/14/16 1107    Subjective The patient reports he was tired after last session, but not too sore.  He walked about 100 ft yesterday indoors.     Pertinent History L hip replace, R hip fracture, h/o multiple CVAs, vascular dementia.   Patient Stated Goals Trying to get back to walking.   Currently in Pain? No/denies  knee to be monitored due to pain            [facility] PT Assessment - 09/14/16 1111      Standardized Balance Assessment   Standardized Balance Assessment Berg Balance Test     Berg Balance Test   Sit to Stand Able to stand using hands after several tries   Standing Unsupported Able to stand 30 seconds unsupported   Sitting with Back Unsupported but Feet Supported on Floor or Stool Able to sit safely and securely 2 minutes   Stand to Sit Controls descent by using hands   Transfers Able to transfer with verbal cueing and /or supervision   Standing Unsupported with Eyes Closed Able to stand 3 seconds   Standing Ubsupported with Feet Together Needs help to attain position but  able to stand for 30 seconds with feet together   From Standing, Reach Forward with Outstretched Arm Reaches forward but needs supervision   From Standing Position, Pick up Object from Floor Unable to try/needs assist to keep balance   From Standing Position, Turn to Look Behind Over each Shoulder Needs assist to keep from losing balance and falling   Turn 360 Degrees Needs assistance while turning   Standing Unsupported, Alternately Place Feet on Step/Stool Needs assistance to keep from falling or unable to try   Standing Unsupported, One Foot in [place] balance while stepping or standing   Standing on One Leg Unable to try or needs assist to prevent fall   Total Score 17   Berg comment: 17/56                      [facility] Adult PT Treatment/Exercise - 09/14/16 1111      Ambulation/Gait   Ambulation/Gait Assistance 5: Supervision;4: Min guard   Ambulation/Gait Assistance Details supervision to CGA for safety   Ambulation Distance (Feet) 40 Feet  115, 40, 50   Assistive device Rolling walker   Gait velocity 0.89 ft/sec   Stairs Yes   Stairs Assistance 4: Min assist   Stair Management Technique Two rails;Step to pattern   Number of Stairs 4     Neuro Re-ed    Neuro Re-ed Details  Standing right LE weight shifting, wall bumps x 8 reps with supervision to CGA, and standing moving L LE into/out of stride position.                   PT Short Term Goals - 09/14/16 1152      PT SHORT TERM GOAL #1   Title The patient will perform HEP with assist from wife as needed for LE strengthening, standing balance and general mobility.  TARGET DATE FOR ALL STGs:  09/13/16   Baseline Patient has HEP that his wife is assisting with--PT to continue to progress.    Time 4   Period Weeks   Status Achieved     PT SHORT TERM GOAL #2   Title The patient will improve Berg score from 11/56 to > or equal to 20/56 to demo improving ability stand without UE support for ADLs.   Baseline Improved from 11/56 up to 17/56.    Time 4   Period Weeks   Status Partially Met     PT SHORT TERM GOAL #3   Title The patient will improve gait speed from 0.65 ft/sec to > or equal to 1.3 ft/sec to demo improved household ambulation (with least restrictive assistive device).   Baseline Improved from 0.65 ft/sec up to 0.89 ft/sec.   Time 4   Period Weeks   Status Partially Met     PT SHORT TERM GOAL #4   Title The patient will perform sit<>stand with UE support modified indep.   Baseline PT provides supervision consistently.     Time 4   Period Weeks   Status Partially Met     PT SHORT TERM GOAL #5   Title The patient will be furthe assessed on stairs and LTG to follow.   Baseline The patient requires min A for stairs x  4 steps.   Time 4   Period Weeks   Status Achieved     PT SHORT TERM GOAL #6   Title The patient will ambulate for 150 ft with least restrictive device modified indep.  Baseline Patient has improved to CGA for gait x 115 feet nonstop.   Time 8   Period Weeks   Status Partially Met           PT Long Term Goals - 08/14/16 2111      PT LONG TERM GOAL #1   Title The patient will be indep with progression of HEP.  TARGET DATE FOR ALL LTGs:  10/14/16   Time 8   Period Weeks     PT LONG TERM GOAL #2   Title The patient will improve Berg score from 11/56 to > or equal to 30/56 to demo improving balance for ADLs.    Time 8   Period Weeks     PT LONG TERM GOAL #3   Title The patient will improve gait speed from 0.65 ft/sec to > or equal to 1.6 ft/sec to demo improving mobility.   Time 8   Period Weeks     PT LONG TERM GOAL #4   Title The patient will move sit<>stand x 5 times with UE support modified indep without use of device to stabilize upon rising to demo improving balance during transitional movements.   Time 8   Period Weeks     PT LONG TERM GOAL #5   Title The patient will ambulate x 400 ft nonstop with least restrictive assistive device modified indep for improved assess to home/limited community surfaces.   Time 8   Period Weeks     Additional Long Term Goals   Additional Long Term Goals Yes     PT LONG TERM GOAL #6   Title LTG to be written for stair negotiation.               Plan - 09/14/16 1155    Clinical Impression Statement The patient partially met STGs.  He has been seen for 5 of 8 recommended visits over past 30 days, which may contribute to not fully meeting STGs.  PT to continue to progress to LTGs emphasizing improving independence for household mobility.   Clinical Impairments Affecting Rehab Potential Barriers to rehab include multiple CVAs, diminished sensation.  Enhancers to rehab potential include family support, patient motivation.   PT  Treatment/Interventions ADLs/Self Care Home Management;Therapeutic activities;Therapeutic exercise;Neuromuscular re-education;Patient/family education;Functional mobility training;Gait training;DME Instruction;Stair training;Manual techniques;Orthotic Fit/Training;Wheelchair mobility training   PT Next Visit Plan R LE strengthening, standing balance dec'ing UE support (recommending for ADLs to do in standing as safe), R knee extension at heel strike to mid stance, quad contraction with hip extension for mid stance control.   Consulted and Agree with Plan of Care Patient;Family member/caregiver   Family Member Consulted spouse      Patient will benefit from skilled therapeutic intervention in order to improve the following deficits and impairments:  Abnormal gait, Decreased balance, Difficulty walking, Decreased strength, Decreased mobility, Impaired sensation, Postural dysfunction, Impaired flexibility, Pain  Visit Diagnosis: Other abnormalities of gait and mobility  Muscle weakness (generalized)  Unsteadiness on feet     Problem List Patient Active Problem List   Diagnosis Date Noted  . Restlessness and agitation 07/02/2016  . Vascular dementia with behavior disturbance 06/29/2016  . Hemiparesis and other late effects of cerebrovascular accident ([place]) 06/29/2016  . Embolic stroke ([place]) 86/57/8469  . Right hemiparesis ([place])   . History of CVA with residual deficit   . Chronic anticoagulation   . Atrial fibrillation with rapid ventricular response ([place])   . Seizure prophylaxis   . Diastolic dysfunction   .  Bacteremia due to Gram-positive bacteria   . Altered mental status   . Dementia without behavioral disturbance   . Leukocytosis   . Acute blood loss anemia   . Acute encephalopathy 06/04/2016  . PAF (paroxysmal atrial fibrillation) ([place])   . Iron deficiency anemia 04/04/2016  . History of CVA (cerebrovascular accident) 04/04/2016  . Cerebral hemorrhage (HCC) w/ SDH s/p IV  tPA   . Coronary artery disease involving native coronary artery of native heart without angina pectoris   . Orthostatic hypotension   . History of right hip replacement   . Acute ischemic stroke ([place]) - L temporal lobe s/p tPA 03/30/2016  . BPH (benign prostatic hyperplasia) 11/25/2015  . GERD (gastroesophageal reflux disease) 11/25/2015  . CKD (chronic kidney disease), stage II 11/25/2015  . Overactive bladder   . Cerebrovascular accident (CVA) ([place]) 09/18/2015  . Gait disturbance, post-stroke 06/23/2015  . 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 Thalamic infarction ([place]) 06/21/2015  . Benign essential HTN   . Type 2 diabetes mellitus with complication, without long-term current use of insulin ([place])   . Dysphagia as late effect of cerebrovascular disease   . Hyponatremia   . HLD (hyperlipidemia)   . Obesity 03/09/2011  . COPD (chronic obstructive pulmonary disease) ([place]) 03/09/2011    Keaton Beichner, PT 09/14/2016, 11:58 AM  [facility] [address] [place], [place], [address] Phone: [phone]   Fax:  [phone]  Name: DORRIS VANGORDER MRN: [id number] Date of Birth: [birth year]

## 2016-09-17 ENCOUNTER — Other Ambulatory Visit: Payer: Self-pay | Admitting: Physical Medicine & Rehabilitation

## 2016-09-17 ENCOUNTER — Ambulatory Visit: Payer: Medicare Other | Admitting: Rehabilitative and Restorative Service Providers"

## 2016-09-17 DIAGNOSIS — R2689 Other abnormalities of gait and mobility: Secondary | ICD-10-CM | POA: Diagnosis not present

## 2016-09-17 DIAGNOSIS — R2681 Unsteadiness on feet: Secondary | ICD-10-CM | POA: Diagnosis not present

## 2016-09-17 DIAGNOSIS — M6281 Muscle weakness (generalized): Secondary | ICD-10-CM

## 2016-09-17 NOTE — Therapy (Signed)
[facility] [address] [place], [place], [address] Phone: [phone]   Fax:  [phone]  Physical Therapy Treatment  Patient Details  Name: Herbert Marquez MRN: [id number] Date of Birth: Apr 16, 1927 Referring Provider: Alysia Penna, MD  Encounter Date: 09/17/2016      PT End of Session - 09/17/16 1601    Visit Number 6   Number of Visits 16   Date for PT Re-Evaluation 10/13/16   Authorization Type G code every 10th visit   PT Start Time 1107   PT Stop Time 1147   PT Time Calculation (min) 40 min   Equipment Utilized During Treatment Gait belt   Activity Tolerance Patient tolerated treatment well   Behavior During Therapy [facility] for tasks assessed/performed      Past Medical History:  Diagnosis Date  . Arthritis    "pretty much all over"   . Basal cell carcinoma    "several burned off his body, face, head"  . BPH (benign prostatic hypertrophy)   . Coronary artery disease    a. s/p PCI of RCA in 2006  . CVA (cerebral infarction)    a. 06/2015: left thalamic and bilateral PCA  . GERD (gastroesophageal reflux disease)   . Hyperlipidemia   . Hypertension   . TIA (transient ischemic attack)    Approximately 6 weeks post-cardiac catheterization.   . Type II diabetes mellitus ([place])    "prediabetic; lost alot of weight; not diabetic now" (06/16/2015)    Past Surgical History:  Procedure Laterality Date  . CARDIOVASCULAR STRESS TEST  07/01/2007   EF 74%  . CATARACT EXTRACTION, BILATERAL    . CORONARY ANGIOPLASTY WITH STENT PLACEMENT  10/2004   stenting x 2 to RCA  . FEMUR IM NAIL Right 11/26/2015   Procedure: INTRAMEDULLARY RIGHT (IM) NAIL FEMORAL;  Surgeon: Rod Can, MD;  Location: [facility];  Service: Orthopedics;  Laterality: Right;  . HERNIA REPAIR    . HIP ARTHROPLASTY  03/09/2011   Procedure: ARTHROPLASTY BIPOLAR HIP;  Surgeon: Mauri Pole;  [facility]: [facility];  Service: Orthopedics;  Laterality:  Left;  . LAPAROSCOPIC INCISIONAL / UMBILICAL / VENTRAL HERNIA REPAIR     "below his naval"    There were no vitals filed for this visit.      Subjective Assessment - 09/17/16 1109    Subjective The patient reports he did his exercises over the weekend.   Pertinent History L hip replace, R hip fracture, h/o multiple CVAs, vascular dementia.   Patient Stated Goals Trying to get back to walking.   Currently in Pain? No/denies  plan to monitor knee pain                         [facility] Adult PT Treatment/Exercise - 09/17/16 1111      Transfers   Transfers Sit to Stand;Stand to Sit   Sit to Stand 5: Supervision   Stand to Sit 5: Supervision     Ambulation/Gait   Ambulation/Gait Yes   Ambulation/Gait Assistance 5: Supervision;4: Min guard   Ambulation/Gait Assistance Details Supervision and tactile cues for right quad contraction working into mid stance of gait and R weight shifting.   Ambulation Distance (Feet) 145 Feet  115, 60x2   Assistive device Rolling walker   Ambulation Surface Level;Indoor     Neuro Re-ed    Neuro Re-ed Details  Standing R weight shift adding visual cues using mirror for further proprioceptive awareness.  Worked on moving towel along floor with R LE emphasizing heel in contact with floor and knee extension with min A near countertop for intermittent support and HHA.  Then attempted to move towel along floor with L LE emphasizing right weight shifting activities.   Standing wall bumps emphasizing hip strategy and knee extension right side.  Sit<>stand with mirror for cues emphasizing upright posture and right LE weight shifting.                PT Education - 09/17/16 1148    Education provided Yes   Education Details HEP: provided pictures from prior HEP and added wall bumps posterior<>anterior   Person(s) Educated Patient;Spouse   Methods Explanation;Demonstration;Handout   Comprehension Verbalized understanding;Returned  demonstration          PT Short Term Goals - 09/14/16 1152      PT SHORT TERM GOAL #1   Title The patient will perform HEP with assist from wife as needed for LE strengthening, standing balance and general mobility.  TARGET DATE FOR ALL STGs:  09/13/16   Baseline Patient has HEP that his wife is assisting with--PT to continue to progress.    Time 4   Period Weeks   Status Achieved     PT SHORT TERM GOAL #2   Title The patient will improve Berg score from 11/56 to > or equal to 20/56 to demo improving ability stand without UE support for ADLs.   Baseline Improved from 11/56 up to 17/56.    Time 4   Period Weeks   Status Partially Met     PT SHORT TERM GOAL #3   Title The patient will improve gait speed from 0.65 ft/sec to > or equal to 1.3 ft/sec to demo improved household ambulation (with least restrictive assistive device).   Baseline Improved from 0.65 ft/sec up to 0.89 ft/sec.   Time 4   Period Weeks   Status Partially Met     PT SHORT TERM GOAL #4   Title The patient will perform sit<>stand with UE support modified indep.   Baseline PT provides supervision consistently.     Time 4   Period Weeks   Status Partially Met     PT SHORT TERM GOAL #5   Title The patient will be furthe assessed on stairs and LTG to follow.   Baseline The patient requires min A for stairs x 4 steps.   Time 4   Period Weeks   Status Achieved     PT SHORT TERM GOAL #6   Title The patient will ambulate for 150 ft with least restrictive device modified indep.   Baseline Patient has improved to CGA for gait x 115 feet nonstop.   Time 8   Period Weeks   Status Partially Met           PT Long Term Goals - 08/14/16 2111      PT LONG TERM GOAL #1   Title The patient will be indep with progression of HEP.  TARGET DATE FOR ALL LTGs:  10/14/16   Time 8   Period Weeks     PT LONG TERM GOAL #2   Title The patient will improve Berg score from 11/56 to > or equal to 30/56 to demo improving  balance for ADLs.    Time 8   Period Weeks     PT LONG TERM GOAL #3   Title The patient will improve gait speed from 0.65 ft/sec to > or  equal to 1.6 ft/sec to demo improving mobility.   Time 8   Period Weeks     PT LONG TERM GOAL #4   Title The patient will move sit<>stand x 5 times with UE support modified indep without use of device to stabilize upon rising to demo improving balance during transitional movements.   Time 8   Period Weeks     PT LONG TERM GOAL #5   Title The patient will ambulate x 400 ft nonstop with least restrictive assistive device modified indep for improved assess to home/limited community surfaces.   Time 8   Period Weeks     Additional Long Term Goals   Additional Long Term Goals Yes     PT LONG TERM GOAL #6   Title LTG to be written for stair negotiation.               Plan - 09/17/16 1602    Clinical Impression Statement The patient is progressing walking distance in therapy.  He is limited by dec'd awareness/perception of weight shift to the right with R knee pain that limits progression of activiites.  PT to continue to address deficits to work towards [place].    Clinical Impairments Affecting Rehab Potential Barriers to rehab include multiple CVAs, diminished sensation.  Enhancers to rehab potential include family support, patient motivation.   PT Treatment/Interventions ADLs/Self Care Home Management;Therapeutic activities;Therapeutic exercise;Neuromuscular re-education;Patient/family education;Functional mobility training;Gait training;DME Instruction;Stair training;Manual techniques;Orthotic Fit/Training;Wheelchair mobility training   PT Next Visit Plan R LE strengthening, standing balance dec'ing UE support (recommending for ADLs to do in standing as safe), R knee extension at heel strike to mid stance, quad contraction with hip extension for mid stance control.   Consulted and Agree with Plan of Care Patient;Family member/caregiver   Family  Member Consulted spouse      Patient will benefit from skilled therapeutic intervention in order to improve the following deficits and impairments:     Visit Diagnosis: Other abnormalities of gait and mobility  Muscle weakness (generalized)     Problem List Patient Active Problem List   Diagnosis Date Noted  . Restlessness and agitation 07/02/2016  . Vascular dementia with behavior disturbance 06/29/2016  . Hemiparesis and other late effects of cerebrovascular accident ([place]) 06/29/2016  . Embolic stroke ([place]) 96/22/2979  . Right hemiparesis ([place])   . History of CVA with residual deficit   . Chronic anticoagulation   . Atrial fibrillation with rapid ventricular response ([place])   . Seizure prophylaxis   . Diastolic dysfunction   . Bacteremia due to Gram-positive bacteria   . Altered mental status   . Dementia without behavioral disturbance   . Leukocytosis   . Acute blood loss anemia   . Acute encephalopathy 06/04/2016  . PAF (paroxysmal atrial fibrillation) ([place])   . Iron deficiency anemia 04/04/2016  . History of CVA (cerebrovascular accident) 04/04/2016  . Cerebral hemorrhage (HCC) w/ SDH s/p IV tPA   . Coronary artery disease involving native coronary artery of native heart without angina pectoris   . Orthostatic hypotension   . History of right hip replacement   . Acute ischemic stroke ([place]) - L temporal lobe s/p tPA 03/30/2016  . BPH (benign prostatic hyperplasia) 11/25/2015  . GERD (gastroesophageal reflux disease) 11/25/2015  . CKD (chronic kidney disease), stage II 11/25/2015  . Overactive bladder   . Cerebrovascular accident (CVA) ([place]) 09/18/2015  . Gait disturbance, post-stroke 06/23/2015  . 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 Thalamic infarction (  [place]) 06/21/2015  . Benign essential HTN   . Type 2 diabetes mellitus with complication, without long-term current use of insulin ([place])   . Dysphagia as late effect of cerebrovascular disease   .  Hyponatremia   . HLD (hyperlipidemia)   . Obesity 03/09/2011  . COPD (chronic obstructive pulmonary disease) ([place]) 03/09/2011    Nariya Neumeyer, PT 09/17/2016, 4:47 PM  [facility] [address] [place], [place], [address] Phone: [phone]   Fax:  [phone]  Name: Herbert Marquez MRN: [id number] Date of Birth: [birth year]

## 2016-09-17 NOTE — Patient Instructions (Signed)
Patient brought in handout from [facility] to review his current HEP: Seated: Ankle pumps LAQ Marching (added green band) Hip abdct/adduction (added green band) Seated sit-ups  Supine:  SLR Hamstring stretch (wife assists) Pension scheme manager  Standing: Heel raises Tandem stance Sideways walk     Extension: Isometric With Knee Extended (Supine)    Position Place pillow or towel roll under right ankle (with heel hanging off the pillow).Make sure knee remains straight. Hold __5 up to 30 minutes_ Repeat _2__ times per day.   Copyright  VHI. All rights reserved.      Electronically signed by Rexanne Mano, PT at 08/31/2016 1:53 PM Electronically signed by Rexanne Mano, PT at 08/31/2016 5:43 PM    Bracing With Bridging     With feet and knees apart, tighten your abdominals and lift bottom. Hold for 3 counts. Slowly lower. Try to keep your knees and body as still as possible. Repeat __5-10_ times. Do _2__ times a day.   Copyright  VHI. All rights reserved.   Knee Extension (Hamstring Stretch) With Pelvis Neutral    Sit on front edge of seat. Put one leg out straight, putting your heel down and pulling your toes towards your face. Slowly and gently bend forward at your hips.  Hold _30__ seconds. Do _2__ times, each leg, __1-2_ times per day.  http://ss.exer.us/88   Copyright  VHI. All rights reserved.       Electronically signed by Rexanne Mano, PT at 08/27/2016 2:49 PM    Weight Shift: Anterior / Posterior (Righting / Equilibrium)    Begin leaning with your hips supported on wall or countertop with your walker in front of you.  Move your hips away from the wall or counter and stand tall holding 5 seconds.  Then return to rest against the wall/countertop.   Repeat __10__ times per session. Do __1__ sessions per day.  Copyright  VHI. All rights reserved.

## 2016-09-20 ENCOUNTER — Ambulatory Visit: Payer: Medicare Other | Admitting: Rehabilitative and Restorative Service Providers"

## 2016-09-20 DIAGNOSIS — R2689 Other abnormalities of gait and mobility: Secondary | ICD-10-CM | POA: Diagnosis not present

## 2016-09-20 DIAGNOSIS — R2681 Unsteadiness on feet: Secondary | ICD-10-CM | POA: Diagnosis not present

## 2016-09-20 DIAGNOSIS — M6281 Muscle weakness (generalized): Secondary | ICD-10-CM

## 2016-09-21 NOTE — Therapy (Signed)
[facility] [address] [place], [place], [address] Phone: [phone]   Fax:  [phone]  Physical Therapy Treatment  Patient Details  Name: Herbert Marquez MRN: [id number] Date of Birth: 08-01-1927 Referring Provider: Alysia Penna, MD  Encounter Date: 09/20/2016      PT End of Session - 09/21/16 1225    Visit Number 7   Number of Visits 16   Date for PT Re-Evaluation 10/13/16   Authorization Type G code every 10th visit   PT Start Time 1108   PT Stop Time 1148   PT Time Calculation (min) 40 min   Equipment Utilized During Treatment Gait belt   Activity Tolerance Patient tolerated treatment well   Behavior During Therapy [facility] for tasks assessed/performed      Past Medical History:  Diagnosis Date  . Arthritis    "pretty much all over"   . Basal cell carcinoma    "several burned off his body, face, head"  . BPH (benign prostatic hypertrophy)   . Coronary artery disease    a. s/p PCI of RCA in 2006  . CVA (cerebral infarction)    a. 06/2015: left thalamic and bilateral PCA  . GERD (gastroesophageal reflux disease)   . Hyperlipidemia   . Hypertension   . TIA (transient ischemic attack)    Approximately 6 weeks post-cardiac catheterization.   . Type II diabetes mellitus ([place])    "prediabetic; lost alot of weight; not diabetic now" (06/16/2015)    Past Surgical History:  Procedure Laterality Date  . CARDIOVASCULAR STRESS TEST  07/01/2007   EF 74%  . CATARACT EXTRACTION, BILATERAL    . CORONARY ANGIOPLASTY WITH STENT PLACEMENT  10/2004   stenting x 2 to RCA  . FEMUR IM NAIL Right 11/26/2015   Procedure: INTRAMEDULLARY RIGHT (IM) NAIL FEMORAL;  Surgeon: Rod Can, MD;  Location: [facility];  Service: Orthopedics;  Laterality: Right;  . HERNIA REPAIR    . HIP ARTHROPLASTY  03/09/2011   Procedure: ARTHROPLASTY BIPOLAR HIP;  Surgeon: Mauri Pole;  Location: [facility];  Service: Orthopedics;  Laterality:  Left;  . LAPAROSCOPIC INCISIONAL / UMBILICAL / VENTRAL HERNIA REPAIR     "below his naval"    There were no vitals filed for this visit.      Subjective Assessment - 09/20/16 1113    Subjective The patient was tired after last session.   The patient's wife notes he is walking better and further in home with wife.     Pertinent History L hip replace, R hip fracture, h/o multiple CVAs, vascular dementia.   Patient Stated Goals Trying to get back to walking.   Currently in Pain? No/denies  monitor knee pain                         [facility] Adult PT Treatment/Exercise - 09/20/16 1225      Transfers   Transfers Sit to Stand;Stand to Sit   Sit to Stand 6: Modified independent (Device/Increase time)  to device, patient is consistently able to perform in therap   Stand to Sit 6: Modified independent (Device/Increase time)     Ambulation/Gait   Ambulation/Gait Yes   Ambulation/Gait Assistance 5: Supervision;4: Min guard   Ambulation/Gait Assistance Details with R knee brace   Ambulation Distance (Feet) 150 Feet  115, 65 x 2   Assistive device Rolling walker   Ambulation Surface Level;Indoor   Gait Comments Gait activities emphasizing increased  step length R, R knee extension at terminal swing and transitioning to mid stance phase of gait.     Neuro Re-ed    Neuro Re-ed Details  Standing weight shift activities to the right side near steps with UE support with PT cues at knee (tactile) moving L LE onto/off of 6" step.  Moving R LE from 6" to 12" step working on proprioceptive awareness.  Moving L LE lateral and return to midline emphasizing R weight shift.  Standing in stance position working on R weight shift.     Exercises   Exercises Other Exercises   Other Exercises  Seated postural exercises working on lengthening R and L lateral trunk, Reviewed bridging activities, worked on hip flexor stretching (in L sidelying with PT passively stretching R hip).                    PT Short Term Goals - 09/14/16 1152      PT SHORT TERM GOAL #1   Title The patient will perform HEP with assist from wife as needed for LE strengthening, standing balance and general mobility.  TARGET DATE FOR ALL STGs:  09/13/16   Baseline Patient has HEP that his wife is assisting with--PT to continue to progress.    Time 4   Period Weeks   Status Achieved     PT SHORT TERM GOAL #2   Title The patient will improve Berg score from 11/56 to > or equal to 20/56 to demo improving ability stand without UE support for ADLs.   Baseline Improved from 11/56 up to 17/56.    Time 4   Period Weeks   Status Partially Met     PT SHORT TERM GOAL #3   Title The patient will improve gait speed from 0.65 ft/sec to > or equal to 1.3 ft/sec to demo improved household ambulation (with least restrictive assistive device).   Baseline Improved from 0.65 ft/sec up to 0.89 ft/sec.   Time 4   Period Weeks   Status Partially Met     PT SHORT TERM GOAL #4   Title The patient will perform sit<>stand with UE support modified indep.   Baseline PT provides supervision consistently.     Time 4   Period Weeks   Status Partially Met     PT SHORT TERM GOAL #5   Title The patient will be furthe assessed on stairs and LTG to follow.   Baseline The patient requires min A for stairs x 4 steps.   Time 4   Period Weeks   Status Achieved     PT SHORT TERM GOAL #6   Title The patient will ambulate for 150 ft with least restrictive device modified indep.   Baseline Patient has improved to CGA for gait x 115 feet nonstop.   Time 8   Period Weeks   Status Partially Met           PT Long Term Goals - 08/14/16 2111      PT LONG TERM GOAL #1   Title The patient will be indep with progression of HEP.  TARGET DATE FOR ALL LTGs:  10/14/16   Time 8   Period Weeks     PT LONG TERM GOAL #2   Title The patient will improve Berg score from 11/56 to > or equal to 30/56 to demo improving  balance for ADLs.    Time 8   Period Weeks     PT LONG TERM  GOAL #3   Title The patient will improve gait speed from 0.65 ft/sec to > or equal to 1.6 ft/sec to demo improving mobility.   Time 8   Period Weeks     PT LONG TERM GOAL #4   Title The patient will move sit<>stand x 5 times with UE support modified indep without use of device to stabilize upon rising to demo improving balance during transitional movements.   Time 8   Period Weeks     PT LONG TERM GOAL #5   Title The patient will ambulate x 400 ft nonstop with least restrictive assistive device modified indep for improved assess to home/limited community surfaces.   Time 8   Period Weeks     Additional Long Term Goals   Additional Long Term Goals Yes     PT LONG TERM GOAL #6   Title LTG to be written for stair negotiation.               Plan - 09/21/16 1228    Clinical Impression Statement The patient is progressing walking distances and PT continuing to emphasis R weight shifting activities.   Clinical Impairments Affecting Rehab Potential Barriers to rehab include multiple CVAs, diminished sensation.  Enhancers to rehab potential include family support, patient motivation.   PT Treatment/Interventions ADLs/Self Care Home Management;Therapeutic activities;Therapeutic exercise;Neuromuscular re-education;Patient/family education;Functional mobility training;Gait training;DME Instruction;Stair training;Manual techniques;Orthotic Fit/Training;Wheelchair mobility training   PT Next Visit Plan R LE strengthening, standing balance dec'ing UE support (recommending for ADLs to do in standing as safe), R knee extension at heel strike to mid stance, quad contraction with hip extension for mid stance control.   Consulted and Agree with Plan of Care Patient      Patient will benefit from skilled therapeutic intervention in order to improve the following deficits and impairments:  Abnormal gait, Decreased balance, Difficulty  walking, Decreased strength, Decreased mobility, Impaired sensation, Postural dysfunction, Impaired flexibility, Pain  Visit Diagnosis: Other abnormalities of gait and mobility  Muscle weakness (generalized)  Unsteadiness on feet     Problem List Patient Active Problem List   Diagnosis Date Noted  . Restlessness and agitation 07/02/2016  . Vascular dementia with behavior disturbance 06/29/2016  . Hemiparesis and other late effects of cerebrovascular accident ([place]) 06/29/2016  . Embolic stroke ([place]) 62/94/7654  . Right hemiparesis ([place])   . History of CVA with residual deficit   . Chronic anticoagulation   . Atrial fibrillation with rapid ventricular response ([place])   . Seizure prophylaxis   . Diastolic dysfunction   . Bacteremia due to Gram-positive bacteria   . Altered mental status   . Dementia without behavioral disturbance   . Leukocytosis   . Acute blood loss anemia   . Acute encephalopathy 06/04/2016  . PAF (paroxysmal atrial fibrillation) ([place])   . Iron deficiency anemia 04/04/2016  . History of CVA (cerebrovascular accident) 04/04/2016  . Cerebral hemorrhage (HCC) w/ SDH s/p IV tPA   . Coronary artery disease involving native coronary artery of native heart without angina pectoris   . Orthostatic hypotension   . History of right hip replacement   . Acute ischemic stroke ([place]) - L temporal lobe s/p tPA 03/30/2016  . BPH (benign prostatic hyperplasia) 11/25/2015  . GERD (gastroesophageal reflux disease) 11/25/2015  . CKD (chronic kidney disease), stage II 11/25/2015  . Overactive bladder   . Cerebrovascular accident (CVA) ([place]) 09/18/2015  . Gait disturbance, post-stroke 06/23/2015  . 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  Thalamic infarction ([place]) 06/21/2015  . Benign essential HTN   . Type 2 diabetes mellitus with complication, without long-term current use of insulin ([place])   . Dysphagia as late effect of cerebrovascular disease   . Hyponatremia   . HLD  (hyperlipidemia)   . Obesity 03/09/2011  . COPD (chronic obstructive pulmonary disease) ([place]) 03/09/2011    Oney Folz, PT 09/21/2016, 12:29 PM  [facility] [address] [place], [place], [address] Phone: [phone]   Fax:  [phone]  Name: TRAMELL PIECHOTA MRN: [id number] Date of Birth: [birth year]

## 2016-09-26 ENCOUNTER — Ambulatory Visit: Payer: Medicare Other | Admitting: Rehabilitative and Restorative Service Providers"

## 2016-09-26 DIAGNOSIS — R2681 Unsteadiness on feet: Secondary | ICD-10-CM

## 2016-09-26 DIAGNOSIS — R2689 Other abnormalities of gait and mobility: Secondary | ICD-10-CM

## 2016-09-26 DIAGNOSIS — M6281 Muscle weakness (generalized): Secondary | ICD-10-CM

## 2016-09-26 NOTE — Therapy (Signed)
[facility] [address] [facility] [place], [place], [address] Phone: [phone]   Fax:  [phone]  Physical Therapy Treatment  Patient Details  Name: Herbert Marquez MRN: [id number] Date of Birth: Feb 12, 1928 Referring Provider: Alysia Penna, MD  Encounter Date: 09/26/2016      PT End of Session - 09/26/16 1310    Visit Number 8   Number of Visits 16   Date for PT Re-Evaluation 10/13/16   Authorization Type G code every 10th visit   PT Start Time 1110   PT Stop Time 1150   PT Time Calculation (min) 40 min   Equipment Utilized During Treatment Gait belt   Activity Tolerance Patient tolerated treatment well   Behavior During Therapy [facility] for tasks assessed/performed      Past Medical History:  Diagnosis Date  . Arthritis    "pretty much all over"   . Basal cell carcinoma    "several burned off his body, face, head"  . BPH (benign prostatic hypertrophy)   . Coronary artery disease    a. s/p PCI of RCA in 2006  . CVA (cerebral infarction)    a. 06/2015: left thalamic and bilateral PCA  . GERD (gastroesophageal reflux disease)   . Hyperlipidemia   . Hypertension   . TIA (transient ischemic attack)    Approximately 6 weeks post-cardiac catheterization.   . Type II diabetes mellitus ([place])    "prediabetic; lost alot of weight; not diabetic now" (06/16/2015)    Past Surgical History:  Procedure Laterality Date  . CARDIOVASCULAR STRESS TEST  07/01/2007   EF 74%  . CATARACT EXTRACTION, BILATERAL    . CORONARY ANGIOPLASTY WITH STENT PLACEMENT  10/2004   stenting x 2 to RCA  . FEMUR IM NAIL Right 11/26/2015   Procedure: INTRAMEDULLARY RIGHT (IM) NAIL FEMORAL;  Surgeon: Rod Can, MD;  [facility];  Service: Orthopedics;  Laterality: Right;  . HERNIA REPAIR    . HIP ARTHROPLASTY  03/09/2011   Procedure: ARTHROPLASTY BIPOLAR HIP;  Surgeon: Mauri Pole;  [facility];  Service: Orthopedics;  Laterality:  Left;  . LAPAROSCOPIC INCISIONAL / UMBILICAL / VENTRAL HERNIA REPAIR     "below his naval"    There were no vitals filed for this visit.      Subjective Assessment - 09/26/16 1308    Subjective The patient reported he walked at home with rollater RW.   Pertinent History L hip replace, R hip fracture, h/o multiple CVAs, vascular dementia.   Patient Stated Goals Trying to get back to walking.                         [facility] Adult PT Treatment/Exercise - 09/26/16 1148      Ambulation/Gait   Ambulation/Gait Yes   Ambulation/Gait Assistance 5: Supervision;4: Min guard   Ambulation/Gait Assistance Details with rollater RW today and R knee brace donned   Ambulation Distance (Feet) 345 Feet  230, rest, 115 ft   Ambulation Surface Level;Indoor     Neuro Re-ed    Neuro Re-ed Details  Standing balance working on decreasing UE support with wall posterior.  then performing wall bumps x 10 reps with walker positioned anteriorly, however encouraged to avoid using UEs.  Standing right weight shift while dec'ing UE support with tactile cues for R knee extension and verbal cues to elicit quad firing R side.  Moving L foot onto/off of compliant surface and into/out of stride.  Performed with CGA to min A for safety.      Exercises   Other Exercises  hip flexor stretch bilaterally x 30 seconds x 2 reps each side.                  PT Short Term Goals - 09/14/16 1152      PT SHORT TERM GOAL #1   Title The patient will perform HEP with assist from wife as needed for LE strengthening, standing balance and general mobility.  TARGET DATE FOR ALL STGs:  09/13/16   Baseline Patient has HEP that his wife is assisting with--PT to continue to progress.    Time 4   Period Weeks   Status Achieved     PT SHORT TERM GOAL #2   Title The patient will improve Berg score from 11/56 to > or equal to 20/56 to demo improving ability stand without UE support for ADLs.   Baseline Improved  from 11/56 up to 17/56.    Time 4   Period Weeks   Status Partially Met     PT SHORT TERM GOAL #3   Title The patient will improve gait speed from 0.65 ft/sec to > or equal to 1.3 ft/sec to demo improved household ambulation (with least restrictive assistive device).   Baseline Improved from 0.65 ft/sec up to 0.89 ft/sec.   Time 4   Period Weeks   Status Partially Met     PT SHORT TERM GOAL #4   Title The patient will perform sit<>stand with UE support modified indep.   Baseline PT provides supervision consistently.     Time 4   Period Weeks   Status Partially Met     PT SHORT TERM GOAL #5   Title The patient will be furthe assessed on stairs and LTG to follow.   Baseline The patient requires min A for stairs x 4 steps.   Time 4   Period Weeks   Status Achieved     PT SHORT TERM GOAL #6   Title The patient will ambulate for 150 ft with least restrictive device modified indep.   Baseline Patient has improved to CGA for gait x 115 feet nonstop.   Time 8   Period Weeks   Status Partially Met           PT Long Term Goals - 08/14/16 2111      PT LONG TERM GOAL #1   Title The patient will be indep with progression of HEP.  TARGET DATE FOR ALL LTGs:  10/14/16   Time 8   Period Weeks     PT LONG TERM GOAL #2   Title The patient will improve Berg score from 11/56 to > or equal to 30/56 to demo improving balance for ADLs.    Time 8   Period Weeks     PT LONG TERM GOAL #3   Title The patient will improve gait speed from 0.65 ft/sec to > or equal to 1.6 ft/sec to demo improving mobility.   Time 8   Period Weeks     PT LONG TERM GOAL #4   Title The patient will move sit<>stand x 5 times with UE support modified indep without use of device to stabilize upon rising to demo improving balance during transitional movements.   Time 8   Period Weeks     PT LONG TERM GOAL #5   Title The patient will ambulate x 400 ft nonstop with least restrictive assistive device  modified  indep for improved assess to home/limited community surfaces.   Time 8   Period Weeks     Additional Long Term Goals   Additional Long Term Goals Yes     PT LONG TERM GOAL #6   Title LTG to be written for stair negotiation.               Plan - 09/26/16 1314    Clinical Impression Statement The patient was able to walk with rollater RW today x 230 ft nonstop.  He does have increased trunk flexion with increased weight through UEs, however this encourages a more reciprocal pattern for gait.  PT to continue progressing to patient's tolerance.    Rehab Potential Good   Clinical Impairments Affecting Rehab Potential Barriers to rehab include multiple CVAs, diminished sensation.  Enhancers to rehab potential include family support, patient motivation.   PT Treatment/Interventions ADLs/Self Care Home Management;Therapeutic activities;Therapeutic exercise;Neuromuscular re-education;Patient/family education;Functional mobility training;Gait training;DME Instruction;Stair training;Manual techniques;Orthotic Fit/Training;Wheelchair mobility training   PT Next Visit Plan Prolonged gait, R LE quad contraction in standing, hip flexor stretching, R weight shifting.   Consulted and Agree with Plan of Care Patient;Family member/caregiver   Family Member Consulted spouse      Patient will benefit from skilled therapeutic intervention in order to improve the following deficits and impairments:  Abnormal gait, Decreased balance, Difficulty walking, Decreased strength, Decreased mobility, Impaired sensation, Postural dysfunction, Impaired flexibility, Pain  Visit Diagnosis: Other abnormalities of gait and mobility  Muscle weakness (generalized)  Unsteadiness on feet     Problem List Patient Active Problem List   Diagnosis Date Noted  . Restlessness and agitation 07/02/2016  . Vascular dementia with behavior disturbance 06/29/2016  . Hemiparesis and other late effects of cerebrovascular  accident ([place]) 06/29/2016  . Embolic stroke ([place]) 19/14/7829  . Right hemiparesis ([place])   . History of CVA with residual deficit   . Chronic anticoagulation   . Atrial fibrillation with rapid ventricular response ([place])   . Seizure prophylaxis   . Diastolic dysfunction   . Bacteremia due to Gram-positive bacteria   . Altered mental status   . Dementia without behavioral disturbance   . Leukocytosis   . Acute blood loss anemia   . Acute encephalopathy 06/04/2016  . PAF (paroxysmal atrial fibrillation) ([place])   . Iron deficiency anemia 04/04/2016  . History of CVA (cerebrovascular accident) 04/04/2016  . Cerebral hemorrhage (HCC) w/ SDH s/p IV tPA   . Coronary artery disease involving native coronary artery of native heart without angina pectoris   . Orthostatic hypotension   . History of right hip replacement   . Acute ischemic stroke ([place]) - L temporal lobe s/p tPA 03/30/2016  . BPH (benign prostatic hyperplasia) 11/25/2015  . GERD (gastroesophageal reflux disease) 11/25/2015  . CKD (chronic kidney disease), stage II 11/25/2015  . Overactive bladder   . Cerebrovascular accident (CVA) ([place]) 09/18/2015  . Gait disturbance, post-stroke 06/23/2015  . 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 Thalamic infarction ([place]) 06/21/2015  . Benign essential HTN   . Type 2 diabetes mellitus with complication, without long-term current use of insulin ([place])   . Dysphagia as late effect of cerebrovascular disease   . Hyponatremia   . HLD (hyperlipidemia)   . Obesity 03/09/2011  . COPD (chronic obstructive pulmonary disease) ([place]) 03/09/2011    Takuya Lariccia, PT 09/26/2016, 1:15 PM  [facility] [address] [place], [place], [address] Phone: [phone]  Fax:  [phone]  Name: Herbert Marquez MRN: [id number] Date of Birth: June 11, 1927

## 2016-09-28 ENCOUNTER — Ambulatory Visit: Payer: Medicare Other | Admitting: Rehabilitative and Restorative Service Providers"

## 2016-09-28 ENCOUNTER — Telehealth: Payer: Self-pay | Admitting: Rehabilitative and Restorative Service Providers"

## 2016-09-28 DIAGNOSIS — R2681 Unsteadiness on feet: Secondary | ICD-10-CM | POA: Diagnosis not present

## 2016-09-28 DIAGNOSIS — M6281 Muscle weakness (generalized): Secondary | ICD-10-CM

## 2016-09-28 DIAGNOSIS — R2689 Other abnormalities of gait and mobility: Secondary | ICD-10-CM | POA: Diagnosis not present

## 2016-09-28 NOTE — Telephone Encounter (Signed)
Dr. Letta Pate, Mr. Haugan was evaluated by physical therapy on 08/14/16.  He would benefit from OT evaluation due to R hand dyscoordination and decreased use for daily activities.   If you agree, please place an order in [facility] workque in [facility] or fax the order to [phone]. Thank you, Kenidi Elenbaas, PT

## 2016-09-28 NOTE — Therapy (Signed)
[facility] [address] [place], [place], [address] Phone: [phone]   Fax:  [phone]  Physical Therapy Treatment  Patient Details  Name: Herbert Marquez MRN: [id number] Date of Birth: June 10, 1927 Referring Provider: Alysia Penna, MD  Encounter Date: 09/28/2016      PT End of Session - 09/28/16 1659    Visit Number 9   Number of Visits 16   Date for PT Re-Evaluation 10/13/16   Authorization Type G code every 10th visit   PT Start Time 1025   PT Stop Time 1108   PT Time Calculation (min) 43 min   Equipment Utilized During Treatment Gait belt   Activity Tolerance Patient tolerated treatment well   Behavior During Therapy [facility] for tasks assessed/performed      Past Medical History:  Diagnosis Date  . Arthritis    "pretty much all over"   . Basal cell carcinoma    "several burned off his body, face, head"  . BPH (benign prostatic hypertrophy)   . Coronary artery disease    a. s/p PCI of RCA in 2006  . CVA (cerebral infarction)    a. 06/2015: left thalamic and bilateral PCA  . GERD (gastroesophageal reflux disease)   . Hyperlipidemia   . Hypertension   . TIA (transient ischemic attack)    Approximately 6 weeks post-cardiac catheterization.   . Type II diabetes mellitus ([place])    "prediabetic; lost alot of weight; not diabetic now" (06/16/2015)    Past Surgical History:  Procedure Laterality Date  . CARDIOVASCULAR STRESS TEST  07/01/2007   EF 74%  . CATARACT EXTRACTION, BILATERAL    . CORONARY ANGIOPLASTY WITH STENT PLACEMENT  10/2004   stenting x 2 to RCA  . FEMUR IM NAIL Right 11/26/2015   Procedure: INTRAMEDULLARY RIGHT (IM) NAIL FEMORAL;  Surgeon: Rod Can, MD;  Location: [facility];  Service: Orthopedics;  Laterality: Right;  . HERNIA REPAIR    . HIP ARTHROPLASTY  03/09/2011   Procedure: ARTHROPLASTY BIPOLAR HIP;  Surgeon: Mauri Pole;  Location: [facility];  Service: Orthopedics;  Laterality:  Left;  . LAPAROSCOPIC INCISIONAL / UMBILICAL / VENTRAL HERNIA REPAIR     "below his naval"    There were no vitals filed for this visit.      Subjective Assessment - 09/28/16 1658    Subjective The patient notes being tired after therapy--- no increase in knee pain.   Pertinent History L hip replace, R hip fracture, h/o multiple CVAs, vascular dementia.   Patient Stated Goals Trying to get back to walking.   Currently in Pain? No/denies  monitor knee pain                         [facility] Adult PT Treatment/Exercise - 09/28/16 1700      Ambulation/Gait   Ambulation/Gait Yes   Ambulation/Gait Assistance 5: Supervision;4: Min guard   Ambulation Distance (Feet) 115 Feet  x 3 reps   Assistive device Rollator   Ambulation Surface Level;Indoor     Neuro Re-ed    Neuro Re-ed Details  Emphasis on upright standing posture with R quad contraction.     Exercises   Exercises Other Exercises   Other Exercises  The patient performed supine thomas test stretch with passive overpressure with c/o discomfort (therefore reduced any overpressure).  Patient performed supine hip flexion/extension lowering R LE off of mat and return to hooklying x 10 reps with tactile cues.  Performed hip hiking/depression emphasizing R quad set, R quad set with straight leg raise.  Passive hamstring stretching.                    PT Short Term Goals - 09/14/16 1152      PT SHORT TERM GOAL #1   Title The patient will perform HEP with assist from wife as needed for LE strengthening, standing balance and general mobility.  TARGET DATE FOR ALL STGs:  09/13/16   Baseline Patient has HEP that his wife is assisting with--PT to continue to progress.    Time 4   Period Weeks   Status Achieved     PT SHORT TERM GOAL #2   Title The patient will improve Berg score from 11/56 to > or equal to 20/56 to demo improving ability stand without UE support for ADLs.   Baseline Improved from 11/56 up to  17/56.    Time 4   Period Weeks   Status Partially Met     PT SHORT TERM GOAL #3   Title The patient will improve gait speed from 0.65 ft/sec to > or equal to 1.3 ft/sec to demo improved household ambulation (with least restrictive assistive device).   Baseline Improved from 0.65 ft/sec up to 0.89 ft/sec.   Time 4   Period Weeks   Status Partially Met     PT SHORT TERM GOAL #4   Title The patient will perform sit<>stand with UE support modified indep.   Baseline PT provides supervision consistently.     Time 4   Period Weeks   Status Partially Met     PT SHORT TERM GOAL #5   Title The patient will be furthe assessed on stairs and LTG to follow.   Baseline The patient requires min A for stairs x 4 steps.   Time 4   Period Weeks   Status Achieved     PT SHORT TERM GOAL #6   Title The patient will ambulate for 150 ft with least restrictive device modified indep.   Baseline Patient has improved to CGA for gait x 115 feet nonstop.   Time 8   Period Weeks   Status Partially Met           PT Long Term Goals - 08/14/16 2111      PT LONG TERM GOAL #1   Title The patient will be indep with progression of HEP.  TARGET DATE FOR ALL LTGs:  10/14/16   Time 8   Period Weeks     PT LONG TERM GOAL #2   Title The patient will improve Berg score from 11/56 to > or equal to 30/56 to demo improving balance for ADLs.    Time 8   Period Weeks     PT LONG TERM GOAL #3   Title The patient will improve gait speed from 0.65 ft/sec to > or equal to 1.6 ft/sec to demo improving mobility.   Time 8   Period Weeks     PT LONG TERM GOAL #4   Title The patient will move sit<>stand x 5 times with UE support modified indep without use of device to stabilize upon rising to demo improving balance during transitional movements.   Time 8   Period Weeks     PT LONG TERM GOAL #5   Title The patient will ambulate x 400 ft nonstop with least restrictive assistive device modified indep for improved  assess to home/limited community surfaces.  Time 8   Period Weeks     Additional Long Term Goals   Additional Long Term Goals Yes     PT LONG TERM GOAL #6   Title LTG to be written for stair negotiation.               Plan - 09/28/16 1704    Clinical Impression Statement The patient is continuing to make gains in PT.  Anticipate need to renew at next goal check on 10/14/16.  Patient increasing tolerance to ambulation and plan to begin to focus on using RW for limited community activities versus wheelchair.     Clinical Impairments Affecting Rehab Potential Barriers to rehab include multiple CVAs, diminished sensation.  Enhancers to rehab potential include family support, patient motivation.   PT Treatment/Interventions ADLs/Self Care Home Management;Therapeutic activities;Therapeutic exercise;Neuromuscular re-education;Patient/family education;Functional mobility training;Gait training;DME Instruction;Stair training;Manual techniques;Orthotic Fit/Training;Wheelchair mobility training   PT Next Visit Plan G CODE NEXT WEEK:  work on gait into/out of clinic, dec'd dependence on w/c, and increasing distance for limited community mobility.   Consulted and Agree with Plan of Care Patient      Patient will benefit from skilled therapeutic intervention in order to improve the following deficits and impairments:  Abnormal gait, Decreased balance, Difficulty walking, Decreased strength, Decreased mobility, Impaired sensation, Postural dysfunction, Impaired flexibility, Pain  Visit Diagnosis: Other abnormalities of gait and mobility  Muscle weakness (generalized)  Unsteadiness on feet     Problem List Patient Active Problem List   Diagnosis Date Noted  . Restlessness and agitation 07/02/2016  . Vascular dementia with behavior disturbance 06/29/2016  . Hemiparesis and other late effects of cerebrovascular accident ([place]) 06/29/2016  . Embolic stroke ([place]) 53/97/6734  . Right  hemiparesis ([place])   . History of CVA with residual deficit   . Chronic anticoagulation   . Atrial fibrillation with rapid ventricular response ([place])   . Seizure prophylaxis   . Diastolic dysfunction   . Bacteremia due to Gram-positive bacteria   . Altered mental status   . Dementia without behavioral disturbance   . Leukocytosis   . Acute blood loss anemia   . Acute encephalopathy 06/04/2016  . PAF (paroxysmal atrial fibrillation) ([place])   . Iron deficiency anemia 04/04/2016  . History of CVA (cerebrovascular accident) 04/04/2016  . Cerebral hemorrhage (HCC) w/ SDH s/p IV tPA   . Coronary artery disease involving native coronary artery of native heart without angina pectoris   . Orthostatic hypotension   . History of right hip replacement   . Acute ischemic stroke ([place]) - L temporal lobe s/p tPA 03/30/2016  . BPH (benign prostatic hyperplasia) 11/25/2015  . GERD (gastroesophageal reflux disease) 11/25/2015  . CKD (chronic kidney disease), stage II 11/25/2015  . Overactive bladder   . Cerebrovascular accident (CVA) ([place]) 09/18/2015  . Gait disturbance, post-stroke 06/23/2015  . 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 Thalamic infarction ([place]) 06/21/2015  . Benign essential HTN   . Type 2 diabetes mellitus with complication, without long-term current use of insulin ([place])   . Dysphagia as late effect of cerebrovascular disease   . Hyponatremia   . HLD (hyperlipidemia)   . Obesity 03/09/2011  . COPD (chronic obstructive pulmonary disease) ([place]) 03/09/2011    Tymothy Cass, PT 09/28/2016, 5:05 PM  [facility] [address] [place], [place], [address] Phone: [phone]   Fax:  [phone]  Name: Herbert Marquez MRN: [id number] Date of Birth: 10/27/1927

## 2016-10-01 ENCOUNTER — Ambulatory Visit (INDEPENDENT_AMBULATORY_CARE_PROVIDER_SITE_OTHER): Payer: Medicare Other | Admitting: Adult Health

## 2016-10-01 ENCOUNTER — Encounter: Payer: Self-pay | Admitting: Adult Health

## 2016-10-01 VITALS — BP 138/82 | HR 87 | Ht 67.0 in | Wt 146.0 lb

## 2016-10-01 DIAGNOSIS — R451 Restlessness and agitation: Secondary | ICD-10-CM | POA: Diagnosis not present

## 2016-10-01 DIAGNOSIS — Z87898 Personal history of other specified conditions: Secondary | ICD-10-CM

## 2016-10-01 DIAGNOSIS — E785 Hyperlipidemia, unspecified: Secondary | ICD-10-CM | POA: Diagnosis not present

## 2016-10-01 DIAGNOSIS — Z0000 Encounter for general adult medical examination without abnormal findings: Secondary | ICD-10-CM

## 2016-10-01 DIAGNOSIS — I639 Cerebral infarction, unspecified: Secondary | ICD-10-CM

## 2016-10-01 DIAGNOSIS — E118 Type 2 diabetes mellitus with unspecified complications: Secondary | ICD-10-CM

## 2016-10-01 DIAGNOSIS — R269 Unspecified abnormalities of gait and mobility: Secondary | ICD-10-CM

## 2016-10-01 DIAGNOSIS — I10 Essential (primary) hypertension: Secondary | ICD-10-CM

## 2016-10-01 DIAGNOSIS — I69398 Other sequelae of cerebral infarction: Secondary | ICD-10-CM

## 2016-10-01 LAB — POCT GLYCOSYLATED HEMOGLOBIN (HGB A1C): Hemoglobin A1C: 5.6

## 2016-10-01 NOTE — Assessment & Plan Note (Signed)
Continue on Atorvastatin 80mg  daily. Denies cramps, med intolerance.

## 2016-10-01 NOTE — Assessment & Plan Note (Signed)
BP close to gaol at 138/82 Continue heart healthy diet

## 2016-10-01 NOTE — Assessment & Plan Note (Signed)
Continue with twice weekly PT and home strengthening.

## 2016-10-01 NOTE — Progress Notes (Signed)
Subjective:    Patient ID: Herbert Marquez, male    DOB: 19-Aug-1927, 83 y.o.   MRN: [id number]  HPI:  Herbert Marquez is here for regular f/u:  HL, HTN, GERD, and T2D.  Overall he feels that he is doing well and has been making excellent progress with PT and regaining strength and mobility.  He reports walking>345" with therapist last Friday.  He is able to ambulate around home (with cane and/or walker) and using wheelchair when out of home travelling/appt's.  He is compliant on all medications and denies SE.  He and his wife report that the nightly Quetiapine 25mg  has eliminated the nightly agitation/restlessness and provides him sound, restorative sleep.   Of Note: Last OV wt in system was at cards on 08/20/16- 160 lbs.  Today's wt 146 lbs.  He denies abdominal pain, blood in urine/stool.  He reports a normal appetite and is not trying to actively loss wt.  Patient Care Team    Relationship Specialty Notifications Start End  Herbert Marble D, NP PCP - General Family Medicine  03/20/16   Herbert Marquez Consulting Physician Cardiology  03/20/16   Herbert Marquez Consulting Physician Orthopedic Surgery  03/20/16   Herbert Marquez Consulting Physician Dermatology  03/20/16   Herbert Marquez Consulting Physician Urology  03/20/16   Herbert Marquez Consulting Physician Ophthalmology  03/20/16     Patient Active Problem List   Diagnosis Date Noted  . Healthcare maintenance 10/01/2016  . Restlessness and agitation 07/02/2016  . Vascular dementia with behavior disturbance 06/29/2016  . Hemiparesis and other late effects of cerebrovascular accident ([place]) 06/29/2016  . Embolic stroke ([place]) 74/02/8785  . Right hemiparesis ([place])   . History of CVA with residual deficit   . Chronic anticoagulation   . Atrial fibrillation with rapid ventricular response ([place])   . Seizure prophylaxis   . Diastolic dysfunction   . Bacteremia due to Gram-positive bacteria   . Altered mental status   .  Dementia without behavioral disturbance   . Leukocytosis   . Acute blood loss anemia   . Acute encephalopathy 06/04/2016  . PAF (paroxysmal atrial fibrillation) ([place])   . Iron deficiency anemia 04/04/2016  . History of CVA (cerebrovascular accident) 04/04/2016  . Cerebral hemorrhage (HCC) w/ SDH s/p IV tPA   . Coronary artery disease involving native coronary artery of native heart without angina pectoris   . Orthostatic hypotension   . History of right hip replacement   . Acute ischemic stroke ([place]) - L temporal lobe s/p tPA 03/30/2016  . BPH (benign prostatic hyperplasia) 11/25/2015  . GERD (gastroesophageal reflux disease) 11/25/2015  . CKD (chronic kidney disease), stage II 11/25/2015  . Overactive bladder   . Cerebrovascular accident (CVA) ([place]) 09/18/2015  . Gait disturbance, post-stroke 06/23/2015  . 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 Thalamic infarction ([place]) 06/21/2015  . Benign essential HTN   . Type 2 diabetes mellitus with complication, without long-term current use of insulin ([place])   . Dysphagia as late effect of cerebrovascular disease   . Hyponatremia   . HLD (hyperlipidemia)   . COPD (chronic obstructive pulmonary disease) ([place]) 03/09/2011     Past Medical History:  Diagnosis Date  . Arthritis    "pretty much all over"   . Basal cell carcinoma    "several burned off his body, face, head"  . BPH (benign prostatic hypertrophy)   . Coronary artery disease    a.  s/p PCI of RCA in 2006  . CVA (cerebral infarction)    a. 06/2015: left thalamic and bilateral PCA  . GERD (gastroesophageal reflux disease)   . Hyperlipidemia   . Hypertension   . TIA (transient ischemic attack)    Approximately 6 weeks post-cardiac catheterization.   . Type II diabetes mellitus ([place])    "prediabetic; lost alot of weight; not diabetic now" (06/16/2015)     Past Surgical History:  Procedure Laterality Date  . CARDIOVASCULAR STRESS TEST  07/01/2007   EF 74%  . CATARACT  EXTRACTION, BILATERAL    . CORONARY ANGIOPLASTY WITH STENT PLACEMENT  10/2004   stenting x 2 to RCA  . FEMUR IM NAIL Right 11/26/2015   Procedure: INTRAMEDULLARY RIGHT (IM) NAIL FEMORAL;  Surgeon: Herbert Marquez;  [facility];  Service: Orthopedics;  Laterality: Right;  . HERNIA REPAIR    . HIP ARTHROPLASTY  03/09/2011   Procedure: ARTHROPLASTY BIPOLAR HIP;  Surgeon: Herbert Marquez;  [facility];  Service: Orthopedics;  Laterality: Left;  . LAPAROSCOPIC INCISIONAL / UMBILICAL / VENTRAL HERNIA REPAIR     "below his naval"     Family History  Problem Relation Age of Onset  . Hypertension Mother   . Lung cancer Father   . Lung cancer Brother      History  Drug Use No     History  Alcohol Use  . 4.8 oz/week  . 1 Cans of beer, 7 Glasses of wine per week    Comment: 1/2 BEER AND 1 WINE     History  Smoking Status  . Former Smoker  . Quit date: 03/06/1971  Smokeless Tobacco  . Never Used     Outpatient Encounter Prescriptions as of 10/01/2016  Medication Sig  . apixaban (ELIQUIS) 5 MG TABS tablet Take 1 tablet (5 mg total) by mouth 2 (two) times daily.  [name] atorvastatin (LIPITOR) 80 MG tablet TAKE 1 TABLET BY MOUTH DAILY.  [name] levETIRAcetam (KEPPRA) 500 MG tablet Take 1 tablet (500 mg total) by mouth 2 (two) times daily.  . Multiple Vitamin (MULTIVITAMIN WITH MINERALS) TABS tablet Take 1 tablet by mouth daily.  . nitroGLYCERIN (NITROSTAT) 0.4 MG SL tablet Place 1 tablet (0.4 mg total) under the tongue every 5 (five) minutes as needed for chest pain.  [name] omeprazole (PRILOSEC) 20 MG capsule Take 20 mg by mouth daily.  . polyvinyl alcohol (ARTIFICIAL TEARS) 1.4 % ophthalmic solution Place 1 drop into both eyes daily as needed for dry eyes.  [name] QUEtiapine (SEROQUEL) 25 MG tablet TAKE 1 TABLET BY MOUTH AT BEDTIME.   No facility-administered encounter medications on file as of 10/01/2016.     Allergies: Patient has no known allergies.  Body mass index is 22.87  kg/m.  Blood pressure 138/82, pulse 87, height 5\' 7"  (1.702 m), weight 146 lb (66.2 kg).    Review of Systems  Constitutional: Positive for fatigue and unexpected weight change. Negative for activity change, appetite change, chills, diaphoresis and fever.  HENT: Positive for congestion. Negative for sinus pain and sinus pressure.   Eyes: Negative for visual disturbance.  Respiratory: Negative for cough, chest tightness, shortness of breath, wheezing and stridor.   Cardiovascular: Negative for chest pain, palpitations and leg swelling.  Gastrointestinal: Negative for abdominal distention, abdominal pain, blood in stool, constipation, diarrhea, nausea and vomiting.  Endocrine: Negative for cold intolerance, heat intolerance, polydipsia, polyphagia and polyuria.  Genitourinary: Positive for frequency. Negative for decreased urine volume, difficulty urinating, flank  pain and hematuria.  Musculoskeletal: Positive for arthralgias, back pain, gait problem, joint swelling, myalgias, neck pain and neck stiffness.  Skin: Negative for color change, pallor, rash and wound.  Allergic/Immunologic: Negative for immunocompromised state.  Neurological: Positive for dizziness.       With some position changes, however will quickly resolve with sitting.  Hematological: Does not bruise/bleed easily.  Psychiatric/Behavioral: Negative for agitation, behavioral problems, confusion, decreased concentration, dysphoric mood, hallucinations, self-injury, sleep disturbance and suicidal ideas. The patient is not nervous/anxious and is not hyperactive.        Objective:   Physical Exam  Constitutional: He is oriented to person, place, and time. He appears well-developed and well-nourished. No distress.  HENT:  Head: Normocephalic and atraumatic.  Right Ear: External ear normal.  Left Ear: External ear normal.  Eyes: Pupils are equal, round, and reactive to light. Conjunctivae are normal.  Neck: Normal range of  motion. Neck supple.  Cardiovascular: Normal rate, regular rhythm, normal heart sounds and intact distal pulses.   No murmur heard. Pulmonary/Chest: Effort normal and breath sounds normal. No respiratory distress. He has no wheezes. He has no rales. He exhibits no tenderness.  Abdominal: Soft. Bowel sounds are normal. He exhibits no distension.  Musculoskeletal: He exhibits edema. He exhibits no tenderness.       Right knee: He exhibits swelling.       Left knee: He exhibits swelling.  R hand grip slightly weaker than L hand grip  Lymphadenopathy:    He has no cervical adenopathy.  Neurological: He is alert and oriented to person, place, and time.  Skin: Skin is warm and dry. No rash noted. He is not diaphoretic. No erythema. No pallor.  Psychiatric: He has a normal mood and affect. His behavior is normal. Judgment and thought content normal.  Nursing note and vitals reviewed.         Assessment & Plan:   1. Type 2 diabetes mellitus with complication, without long-term current use of insulin ([place])   2. History of prediabetes   3. Benign essential HTN   4. Healthcare maintenance   5. Restlessness and agitation   6. Gait disturbance, post-stroke   7. Hyperlipidemia, unspecified hyperlipidemia type     Benign essential HTN BP close to gaol at 138/82 Continue heart healthy diet  Healthcare maintenance Continue all medications as directed. Please ask Dr. Letta Pate about refill on Seroquel at your appt this Friday. Recommend to drink daily Ensure/Boost to help maintain weight. Last OV wt in system was at cards on 08/20/16- 160 lbs.  Today's wt 146 lbs. Will continue to monitor.  Please continue with physical therapy and other specialists as directed. Please schedule complete physical in 3 months.  Restlessness and agitation This has resolved since addition of nightly Quetiapine 25mg .   Gait disturbance, post-stroke Continue with twice weekly PT and home strengthening.    HLD (hyperlipidemia) Continue on Atorvastatin 80mg  daily. Denies cramps, med intolerance.   Type 2 diabetes mellitus with complication, without long-term current use of insulin (HCC) A1c today 5.6 A1c 06/20/16 5.5 He has been off antidiabetic medications since last hospital admission. We discussed the findings and determined that he is not currently diabetic.  Continue to follow heart healthy diet and perform regular movement.     FOLLOW-UP:  Return in about 3 months (around 01/01/2017) for CPE.

## 2016-10-01 NOTE — Assessment & Plan Note (Signed)
A1c today 5.6 A1c 06/20/16 5.5 He has been off antidiabetic medications since last hospital admission. We discussed the findings and determined that he is not currently diabetic.  Continue to follow heart healthy diet and perform regular movement.

## 2016-10-01 NOTE — Assessment & Plan Note (Signed)
This has resolved since addition of nightly Quetiapine 25mg .

## 2016-10-01 NOTE — Patient Instructions (Addendum)
Heart-Healthy Eating Plan Many factors influence your heart health, including eating and exercise habits. Heart (coronary) risk increases with abnormal blood fat (lipid) levels. Heart-healthy meal planning includes limiting unhealthy fats, increasing healthy fats, and making other small dietary changes. This includes maintaining a healthy body weight to help keep lipid levels within a normal range. What is my plan? Your health care provider recommends that you:  Get no more than _________% of the total calories in your daily diet from fat.  Limit your intake of saturated fat to less than _________% of your total calories each day.  Limit the amount of cholesterol in your diet to less than _________ mg per day.  What types of fat should I choose?  Choose healthy fats more often. Choose monounsaturated and polyunsaturated fats, such as olive oil and canola oil, flaxseeds, walnuts, almonds, and seeds.  Eat more omega-3 fats. Good choices include salmon, mackerel, sardines, tuna, flaxseed oil, and ground flaxseeds. Aim to eat fish at least two times each week.  Limit saturated fats. Saturated fats are primarily found in animal products, such as meats, butter, and cream. Plant sources of saturated fats include palm oil, palm kernel oil, and coconut oil.  Avoid foods with partially hydrogenated oils in them. These contain trans fats. Examples of foods that contain trans fats are stick margarine, some tub margarines, cookies, crackers, and other baked goods. What general guidelines do I need to follow?  Check food labels carefully to identify foods with trans fats or high amounts of saturated fat.  Fill one half of your plate with vegetables and green salads. Eat 4-5 servings of vegetables per day. A serving of vegetables equals 1 cup of raw leafy vegetables,  cup of raw or cooked cut-up vegetables, or  cup of vegetable juice.  Fill one fourth of your plate with whole grains. Look for the word  "whole" as the first word in the ingredient list.  Fill one fourth of your plate with lean protein foods.  Eat 4-5 servings of fruit per day. A serving of fruit equals one medium whole fruit,  cup of dried fruit,  cup of fresh, frozen, or canned fruit, or  cup of 100% fruit juice.  Eat more foods that contain soluble fiber. Examples of foods that contain this type of fiber are apples, broccoli, carrots, beans, peas, and barley. Aim to get 20-30 g of fiber per day.  Eat more home-cooked food and less restaurant, buffet, and fast food.  Limit or avoid alcohol.  Limit foods that are high in starch and sugar.  Avoid fried foods.  Cook foods by using methods other than frying. Baking, boiling, grilling, and broiling are all great options. Other fat-reducing suggestions include: ? Removing the skin from poultry. ? Removing all visible fats from meats. ? Skimming the fat off of stews, soups, and gravies before serving them. ? Steaming vegetables in water or broth.  Lose weight if you are overweight. Losing just 5-10% of your initial body weight can help your overall health and prevent diseases such as diabetes and heart disease.  Increase your consumption of nuts, legumes, and seeds to 4-5 servings per week. One serving of dried beans or legumes equals  cup after being cooked, one serving of nuts equals 1 ounces, and one serving of seeds equals  ounce or 1 tablespoon.  You may need to monitor your salt (sodium) intake, especially if you have high blood pressure. Talk with your health care provider or dietitian to get  more information about reducing sodium. What foods can I eat? Grains  Breads, including [place], white, pita, wheat, raisin, rye, oatmeal, and [place]. Tortillas that are neither fried nor made with lard or trans fat. Low-fat rolls, including hotdog and hamburger buns and English muffins. Biscuits. Muffins. Waffles. Pancakes. Light popcorn. Whole-grain cereals. Flatbread.  Melba toast. Pretzels. Breadsticks. Rusks. Low-fat snacks and crackers, including oyster, saltine, matzo, graham, animal, and rye. Rice and pasta, including brown rice and those that are made with whole wheat. Vegetables All vegetables. Fruits All fruits, but limit coconut. Meats and Other Protein Sources Lean, well-trimmed beef, veal, pork, and lamb. Chicken and [place] without skin. All fish and shellfish. Wild duck, rabbit, pheasant, and venison. Egg whites or low-cholesterol egg substitutes. Dried beans, peas, lentils, and tofu.Seeds and most nuts. Dairy Low-fat or nonfat cheeses, including ricotta, string, and mozzarella. Skim or 1% milk that is liquid, powdered, or evaporated. Buttermilk that is made with low-fat milk. Nonfat or low-fat yogurt. Beverages Mineral water. Diet carbonated beverages. Sweets and Desserts Sherbets and fruit ices. Honey, jam, marmalade, jelly, and syrups. Meringues and gelatins. Pure sugar candy, such as hard candy, jelly beans, gumdrops, mints, marshmallows, and small amounts of dark chocolate. [employer] Eat all sweets and desserts in moderation. Fats and Oils Nonhydrogenated (trans-free) margarines. Vegetable oils, including soybean, sesame, sunflower, olive, peanut, safflower, corn, canola, and cottonseed. Salad dressings or mayonnaise that are made with a vegetable oil. Limit added fats and oils that you use for cooking, baking, salads, and as spreads. Other Cocoa powder. Coffee and tea. All seasonings and condiments. The items listed above may not be a complete list of recommended foods or beverages. Contact your dietitian for more options. What foods are not recommended? Grains Breads that are made with saturated or trans fats, oils, or whole milk. Croissants. Butter rolls. Cheese breads. Sweet rolls. Donuts. Buttered popcorn. Chow mein noodles. High-fat crackers, such as cheese or butter crackers. Meats and Other Protein Sources Fatty meats, such  as hotdogs, short ribs, sausage, spareribs, bacon, ribeye roast or steak, and mutton. High-fat deli meats, such as salami and bologna. Caviar. Domestic duck and goose. Organ meats, such as kidney, liver, sweetbreads, brains, gizzard, chitterlings, and heart. Dairy Cream, sour cream, cream cheese, and creamed cottage cheese. Whole milk cheeses, including blue (bleu), Monte[place] Jack, [place], [place], [place], [place], [place], [place], [place], and [place]. Whole or 2% milk that is liquid, evaporated, or condensed. Whole buttermilk. Cream sauce or high-fat cheese sauce. Yogurt that is made from whole milk. Beverages Regular sodas and drinks with added sugar. Sweets and Desserts Frosting. Pudding. Cookies. Cakes other than angel food cake. Candy that has milk chocolate or white chocolate, hydrogenated fat, butter, coconut, or unknown ingredients. Buttered syrups. Full-fat ice cream or ice cream drinks. Fats and Oils Gravy that has suet, meat fat, or shortening. Cocoa butter, hydrogenated oils, palm oil, coconut oil, palm kernel oil. These can often be found in baked products, candy, fried foods, nondairy creamers, and whipped toppings. Solid fats and shortenings, including bacon fat, salt pork, lard, and butter. Nondairy cream substitutes, such as coffee creamers and sour cream substitutes. Salad dressings that are made of unknown oils, cheese, or sour cream. The items listed above may not be a complete list of foods and beverages to avoid. Contact your dietitian for more information. This information is not intended to replace advice given to you by your health care provider. Make sure you discuss any questions you have with your health care  provider. Document Released: 11/29/2007 Document Revised: 09/09/2015 Document Reviewed: 08/13/2013 Elsevier Interactive Patient Education  2017 [employer] all medications as directed. Please ask Dr. Letta Pate about refill on Seroquel at your appt this  Friday. Recommend to drink daily Ensure/Boost to help maintain weight. Please continue with physical therapy and other specialists as directed. Please schedule complete physical in 3 months. GREAT TO SEE YOU!

## 2016-10-01 NOTE — Assessment & Plan Note (Addendum)
Continue all medications as directed. Please ask Dr. Letta Pate about refill on Seroquel at your appt this Friday. Recommend to drink daily Ensure/Boost to help maintain weight. Last OV wt in system was at cards on 08/20/16- 160 lbs.  Today's wt 146 lbs. Will continue to monitor.  Please continue with physical therapy and other specialists as directed. Please schedule complete physical in 3 months.

## 2016-10-05 ENCOUNTER — Encounter: Payer: Medicare Other | Attending: Physical Medicine & Rehabilitation

## 2016-10-05 ENCOUNTER — Ambulatory Visit (HOSPITAL_BASED_OUTPATIENT_CLINIC_OR_DEPARTMENT_OTHER): Payer: Medicare Other | Admitting: Physical Medicine & Rehabilitation

## 2016-10-05 ENCOUNTER — Encounter: Payer: Self-pay | Admitting: Physical Medicine & Rehabilitation

## 2016-10-05 VITALS — BP 134/83 | HR 71 | Resp 14

## 2016-10-05 DIAGNOSIS — I69998 Other sequelae following unspecified cerebrovascular disease: Secondary | ICD-10-CM | POA: Diagnosis not present

## 2016-10-05 DIAGNOSIS — I69398 Other sequelae of cerebral infarction: Secondary | ICD-10-CM

## 2016-10-05 DIAGNOSIS — R269 Unspecified abnormalities of gait and mobility: Secondary | ICD-10-CM

## 2016-10-05 DIAGNOSIS — M1711 Unilateral primary osteoarthritis, right knee: Secondary | ICD-10-CM | POA: Diagnosis not present

## 2016-10-05 DIAGNOSIS — I639 Cerebral infarction, unspecified: Secondary | ICD-10-CM

## 2016-10-05 DIAGNOSIS — I69359 Hemiplegia and hemiparesis following cerebral infarction affecting unspecified side: Secondary | ICD-10-CM | POA: Diagnosis not present

## 2016-10-05 DIAGNOSIS — I69898 Other sequelae of other cerebrovascular disease: Secondary | ICD-10-CM | POA: Diagnosis present

## 2016-10-05 NOTE — Progress Notes (Signed)
Subjective:    Patient ID: Herbert Marquez, male    DOB: [birth year], 83 y.o.   MRN: [id number]  HPI Back in outpt therapy, walked 64' with walker Amb in home with supervision  Right knee with increased pain Uses "blue goo" at night before bedtime  No recent trauma or falls.  Ambulation distance has been improving overall  Pain Inventory Average Pain 3 Pain Right Now 0 My pain is aching  In the last 24 hours, has pain interfered with the following? General activity 0 Relation with others 0 Enjoyment of life 0 What TIME of day is your pain at its worst? daytime Sleep (in general) Good  Pain is worse with: walking and standing Pain improves with: rest and medication Relief from Meds: 5  Mobility walk with assistance use a walker how many minutes can you walk? 10-15 ability to climb steps?  yes do you drive?  no use a wheelchair transfers alone Do you have any goals in this area?  yes  Function retired Do you have any goals in this area?  no  Neuro/Psych bladder control problems trouble walking  Prior Studies Any changes since last visit?  no  Physicians involved in your care Any changes since last visit?  no   Family History  Problem Relation Age of Onset  . Hypertension Mother   . Lung cancer Father   . Lung cancer Brother    Social History   Social History  . Marital status: Married    Spouse name: N/A  . Number of children: N/A  . Years of education: N/A   Occupational History  . Retired Other   Social History Main Topics  . Smoking status: Former Smoker    Quit date: 03/06/1971  . Smokeless tobacco: Never Used  . Alcohol use 4.8 oz/week    1 Cans of beer, 7 Glasses of wine per week     Comment: 1/2 BEER AND 1 WINE  . Drug use: No  . Sexual activity: No   Other Topics Concern  . None   Social History Narrative   Lives in [place], [place] with wife. Has 3 children.    Past Surgical History:  Procedure Laterality Date  .  CARDIOVASCULAR STRESS TEST  07/01/2007   EF 74%  . CATARACT EXTRACTION, BILATERAL    . CORONARY ANGIOPLASTY WITH STENT PLACEMENT  10/2004   stenting x 2 to RCA  . FEMUR IM NAIL Right 11/26/2015   Procedure: INTRAMEDULLARY RIGHT (IM) NAIL FEMORAL;  Surgeon: Rod Can, MD;  Location: [facility];  Service: Orthopedics;  Laterality: Right;  . HERNIA REPAIR    . HIP ARTHROPLASTY  03/09/2011   Procedure: ARTHROPLASTY BIPOLAR HIP;  Surgeon: Mauri Pole;  Location: [facility];  Service: Orthopedics;  Laterality: Left;  . LAPAROSCOPIC INCISIONAL / UMBILICAL / VENTRAL HERNIA REPAIR     "below his naval"   Past Medical History:  Diagnosis Date  . Arthritis    "pretty much all over"   . Basal cell carcinoma    "several burned off his body, face, head"  . BPH (benign prostatic hypertrophy)   . Coronary artery disease    a. s/p PCI of RCA in 2006  . CVA (cerebral infarction)    a. 06/2015: left thalamic and bilateral PCA  . GERD (gastroesophageal reflux disease)   . Hyperlipidemia   . Hypertension   . TIA (transient ischemic attack)    Approximately 6 weeks post-cardiac catheterization.   . Type  II diabetes mellitus ([place])    "prediabetic; lost alot of weight; not diabetic now" (06/16/2015)   BP 134/83   Pulse 71   Resp 14   SpO2 99%   Opioid Risk Score:   Fall Risk Score:  `1  Depression screen PHQ 2/9  Depression screen [facility] 2/9 06/29/2016 03/20/2016 08/18/2015 07/21/2015  Decreased Interest 1 1 0 0  Down, Depressed, Hopeless 1 1 0 1  PHQ - 2 Score 2 2 0 1  Altered sleeping 0 3 - 0  Tired, decreased energy 2 0 - 2  Change in appetite 0 0 - 0  Feeling bad or failure about yourself  1 1 - 1  Trouble concentrating 2 0 - 0  Moving slowly or fidgety/restless 0 0 - 1  Suicidal thoughts 0 0 - 0  PHQ-9 Score 7 6 - 5  Difficult doing work/chores Somewhat difficult Somewhat difficult - Somewhat difficult    Review of Systems  Constitutional: Positive for unexpected weight change.  HENT:  Negative.   Eyes: Negative.   Respiratory: Negative.   Cardiovascular: Negative.   Gastrointestinal: Negative.   Endocrine: Negative.   Genitourinary: Negative.   Musculoskeletal: Positive for arthralgias and gait problem.  Skin: Negative.   Allergic/Immunologic: Negative.   Hematological: Negative.   Psychiatric/Behavioral: Negative.        Objective:   Physical Exam  Constitutional: He is oriented to person, place, and time. He appears well-developed and well-nourished.  HENT:  Head: Normocephalic and atraumatic.  Eyes: Pupils are equal, round, and reactive to light. Conjunctivae and EOM are normal.  Neck: Normal range of motion.  Neurological: He is alert and oriented to person, place, and time.  Psychiatric: He has a normal mood and affect.  Nursing note and vitals reviewed.  Motor strength is 4 minus in the right deltoid by stress of finger flexors and extensors, 4 minus in the right hip flexor, knee extensor, 3 minus. Ankle dorsiflexor. Left side is 5/5 Sensation intact to light touch in the right upper extremity reduced in the right medial ankle, but intact at the knee and lateral ankle. Mood and affect are appropriate       Assessment & Plan:  1. History of left thalamic infarct, as discussed with patient and his wife his right upper extremity symptoms are chronic and are unlikely to improve with time. He's had some worsening of his gait since the most recent CVA, but is overall improving now. Continue outpatient PT  2. Right knee pain, osteoarthritis, aggravated by increased ambulatory distances. Last injection, greater than 3 months ago. We'll repeat corticosteroid injection.  Knee injection   Indication:Right Knee pain not relieved by medication management and other conservative care.  Informed consent was obtained after describing risks and benefits of the procedure with the patient, this includes bleeding, bruising, infection and medication side effects. The  patient wishes to proceed and has given written consent. The patient was placed in a recumbent position. The medial aspect of the knee was marked and prepped with Betadine and alcohol. It was then entered with a 25-gauge 1-1/2 inch  inserted into the knee joint. After negative draw back for blood, a solution containing one ML of 6mg  per mL betamethasone and 3 mL of 1% lidocaine were injected. The patient tolerated the procedure well. Post procedure instructions were given.

## 2016-10-08 ENCOUNTER — Ambulatory Visit
Payer: Medicare Other | Attending: Physical Medicine & Rehabilitation | Admitting: Rehabilitative and Restorative Service Providers"

## 2016-10-08 DIAGNOSIS — R2689 Other abnormalities of gait and mobility: Secondary | ICD-10-CM | POA: Insufficient documentation

## 2016-10-08 DIAGNOSIS — R2681 Unsteadiness on feet: Secondary | ICD-10-CM | POA: Diagnosis not present

## 2016-10-08 DIAGNOSIS — M6281 Muscle weakness (generalized): Secondary | ICD-10-CM | POA: Diagnosis not present

## 2016-10-08 NOTE — Therapy (Signed)
[facility] [address] [place], [place], [address] Phone: [phone]   Fax:  [phone]  Physical Therapy Treatment  Patient Details  Name: Herbert Marquez MRN: [id number] Date of Birth: [birth year] Referring Provider: Alysia Penna, MD  Encounter Date: 10/08/2016      PT End of Session - 10/08/16 1314    Visit Number 10   Number of Visits 16   Date for PT Re-Evaluation 10/13/16   Authorization Type G code every 10th visit   PT Start Time 1316   PT Stop Time 1400   PT Time Calculation (min) 44 min   Equipment Utilized During Treatment Gait belt   Activity Tolerance Patient tolerated treatment well   Behavior During Therapy [facility] for tasks assessed/performed      Past Medical History:  Diagnosis Date  . Arthritis    "pretty much all over"   . Basal cell carcinoma    "several burned off his body, face, head"  . BPH (benign prostatic hypertrophy)   . Coronary artery disease    a. s/p PCI of RCA in 2006  . CVA (cerebral infarction)    a. 06/2015: left thalamic and bilateral PCA  . GERD (gastroesophageal reflux disease)   . Hyperlipidemia   . Hypertension   . TIA (transient ischemic attack)    Approximately 6 weeks post-cardiac catheterization.   . Type II diabetes mellitus ([place])    "prediabetic; lost alot of weight; not diabetic now" (06/16/2015)    Past Surgical History:  Procedure Laterality Date  . CARDIOVASCULAR STRESS TEST  07/01/2007   EF 74%  . CATARACT EXTRACTION, BILATERAL    . CORONARY ANGIOPLASTY WITH STENT PLACEMENT  10/2004   stenting x 2 to RCA  . FEMUR IM NAIL Right 11/26/2015   Procedure: INTRAMEDULLARY RIGHT (IM) NAIL FEMORAL;  Surgeon: Rod Can, MD;  Location: [facility];  Service: Orthopedics;  Laterality: Right;  . HERNIA REPAIR    . HIP ARTHROPLASTY  03/09/2011   Procedure: ARTHROPLASTY BIPOLAR HIP;  Surgeon: Mauri Pole;  Location: [facility];  Service: Orthopedics;  Laterality:  Left;  . LAPAROSCOPIC INCISIONAL / UMBILICAL / VENTRAL HERNIA REPAIR     "below his naval"    There were no vitals filed for this visit.      Subjective Assessment - 10/08/16 1318    Subjective The patient has walked every day since last therapy session (he notes he does about 200 ft).  He is using the rollater RW.    Pertinent History L hip replace, R hip fracture, h/o multiple CVAs, vascular dementia.   Patient Stated Goals Trying to get back to walking.                         [facility] Adult PT Treatment/Exercise - 10/08/16 1422      Ambulation/Gait   Ambulation/Gait Yes   Ambulation/Gait Assistance 5: Supervision   Ambulation/Gait Assistance Details Patient ambulates with rollater RW with supervision on level surfaces x 150 ft x 2 reps.  He requies min A for turns to sit on rollater and sequencing to lock wheel locks.  Also requires CGA to turn with rollater RW to negotiate obstacles.   Assistive device Rollator   Ambulation Surface Level;Indoor;Outdoor   Gait Comments We began discussing walking for functional activities versus as exercise (he currently does blocked practice of ambulation in home versus incorporating into daily mobility).  The patient walked from clinic up to  front desk to check out with rollater and supervision x 100 ft, rested, then walked out to car on level/paved surfaces with verbal cues on technique to back up to seat to get into/out of car.  The patient tolerated well.      Neuro Re-ed    Neuro Re-ed Details  Standing activities holding onto rollater RW without R knee brace donned.   PT encouraging greater weight shift and providing tactile cues behind knee for greater quad contraction.     Exercises   Exercises Other Exercises   Other Exercises  Supine quad set with tactile cues for stronger contraction, performed quad set + straight leg raise with tactile cues x 8 repetitions.  L sidelying, right hp abduction/clamshells x 10 reps.  Supine  marching x 10 reps each leg with verbal and tactile cues.                  PT Short Term Goals - 09/14/16 1152      PT SHORT TERM GOAL #1   Title The patient will perform HEP with assist from wife as needed for LE strengthening, standing balance and general mobility.  TARGET DATE FOR ALL STGs:  09/13/16   Baseline Patient has HEP that his wife is assisting with--PT to continue to progress.    Time 4   Period Weeks   Status Achieved     PT SHORT TERM GOAL #2   Title The patient will improve Berg score from 11/56 to > or equal to 20/56 to demo improving ability stand without UE support for ADLs.   Baseline Improved from 11/56 up to 17/56.    Time 4   Period Weeks   Status Partially Met     PT SHORT TERM GOAL #3   Title The patient will improve gait speed from 0.65 ft/sec to > or equal to 1.3 ft/sec to demo improved household ambulation (with least restrictive assistive device).   Baseline Improved from 0.65 ft/sec up to 0.89 ft/sec.   Time 4   Period Weeks   Status Partially Met     PT SHORT TERM GOAL #4   Title The patient will perform sit<>stand with UE support modified indep.   Baseline PT provides supervision consistently.     Time 4   Period Weeks   Status Partially Met     PT SHORT TERM GOAL #5   Title The patient will be furthe assessed on stairs and LTG to follow.   Baseline The patient requires min A for stairs x 4 steps.   Time 4   Period Weeks   Status Achieved     PT SHORT TERM GOAL #6   Title The patient will ambulate for 150 ft with least restrictive device modified indep.   Baseline Patient has improved to CGA for gait x 115 feet nonstop.   Time 8   Period Weeks   Status Partially Met           PT Long Term Goals - 08/14/16 2111      PT LONG TERM GOAL #1   Title The patient will be indep with progression of HEP.  TARGET DATE FOR ALL LTGs:  10/14/16   Time 8   Period Weeks     PT LONG TERM GOAL #2   Title The patient will improve Berg  score from 11/56 to > or equal to 30/56 to demo improving balance for ADLs.    Time 8   Period Weeks  PT LONG TERM GOAL #3   Title The patient will improve gait speed from 0.65 ft/sec to > or equal to 1.6 ft/sec to demo improving mobility.   Time 8   Period Weeks     PT LONG TERM GOAL #4   Title The patient will move sit<>stand x 5 times with UE support modified indep without use of device to stabilize upon rising to demo improving balance during transitional movements.   Time 8   Period Weeks     PT LONG TERM GOAL #5   Title The patient will ambulate x 400 ft nonstop with least restrictive assistive device modified indep for improved assess to home/limited community surfaces.   Time 8   Period Weeks     Additional Long Term Goals   Additional Long Term Goals Yes     PT LONG TERM GOAL #6   Title LTG to be written for stair negotiation.               Plan - 30-Oct-2016 1431    Clinical Impression Statement The patient tolerated walking from PT clinic to his car in parking lot with supervision and verbal cues on technique to back up to car seat (PT opened car door). Discussed emphasis on increasing walking distances for functional/daily activities as patient uses w/c for majority of mobility and walks for exercise.    PT Treatment/Interventions ADLs/Self Care Home Management;Therapeutic activities;Therapeutic exercise;Neuromuscular re-education;Patient/family education;Functional mobility training;Gait training;DME Instruction;Stair training;Manual techniques;Orthotic Fit/Training;Wheelchair mobility training   PT Next Visit Plan Work on walking into/out of clinic, dec'd dependence on w/c and increasing distance for limited community mobility.  Plan to check goals next week (offer PT appt on c.Precious Gilchrest on 8/13 _0  slot) and continue x 4 more weeks.   Consulted and Agree with Plan of Care Patient;Family member/caregiver   Family Member Consulted spouse      Patient will  benefit from skilled therapeutic intervention in order to improve the following deficits and impairments:  Abnormal gait, Decreased balance, Difficulty walking, Decreased strength, Decreased mobility, Impaired sensation, Postural dysfunction, Impaired flexibility, Pain  Visit Diagnosis: Other abnormalities of gait and mobility  Muscle weakness (generalized)  Unsteadiness on feet       G-Codes - 2016/10/30 1315    Functional Assessment Tool Used (Outpatient Only) Berg=17/56 and gait speed=0.89 ft/sec   Functional Limitation Mobility: Walking and moving around   Mobility: Walking and Moving Around Current Status [phone]) At least 60 percent but less than 80 percent impaired, limited or restricted   Mobility: Walking and Moving Around Goal Status [phone]) At least 40 percent but less than 60 percent impaired, limited or restricted      Problem List Patient Active Problem List   Diagnosis Date Noted  . Healthcare maintenance 10/01/2016  . Restlessness and agitation 07/02/2016  . Vascular dementia with behavior disturbance 06/29/2016  . Hemiparesis and other late effects of cerebrovascular accident ([place]) 06/29/2016  . Embolic stroke ([place]) 41/28/7867  . Right hemiparesis ([place])   . History of CVA with residual deficit   . Chronic anticoagulation   . Atrial fibrillation with rapid ventricular response ([place])   . Seizure prophylaxis   . Diastolic dysfunction   . Bacteremia due to Gram-positive bacteria   . Altered mental status   . Dementia without behavioral disturbance   . Leukocytosis   . Acute blood loss anemia   . Acute encephalopathy 06/04/2016  . PAF (paroxysmal atrial fibrillation) ([place])   . Iron deficiency anemia 04/04/2016  .  History of CVA (cerebrovascular accident) 04/04/2016  . Cerebral hemorrhage ([facility]) w/ SDH s/p IV tPA   . Coronary artery disease involving native coronary artery of native heart without angina pectoris   . Orthostatic hypotension   . History of right hip  replacement   . Acute ischemic stroke ([place]) - L temporal lobe s/p tPA 03/30/2016  . BPH (benign prostatic hyperplasia) 11/25/2015  . GERD (gastroesophageal reflux disease) 11/25/2015  . CKD (chronic kidney disease), stage II 11/25/2015  . Overactive bladder   . Cerebrovascular accident (CVA) ([place]) 09/18/2015  . Gait disturbance, post-stroke 06/23/2015  . 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 Thalamic infarction ([place]) 06/21/2015  . Benign essential HTN   . Type 2 diabetes mellitus with complication, without long-term current use of insulin ([place])   . Dysphagia as late effect of cerebrovascular disease   . Hyponatremia   . HLD (hyperlipidemia)   . COPD (chronic obstructive pulmonary disease) ([place]) 03/09/2011    Carlissa Pesola, PT 10/08/2016, 2:34 PM  [facility] [address] [place], [place], [address] Phone: [phone]   Fax:  [phone]  Name: Herbert Marquez MRN: [id number] Date of Birth: 01-26-1928

## 2016-10-09 ENCOUNTER — Ambulatory Visit (INDEPENDENT_AMBULATORY_CARE_PROVIDER_SITE_OTHER): Payer: Medicare Other | Admitting: Neurology

## 2016-10-09 ENCOUNTER — Encounter: Payer: Self-pay | Admitting: Neurology

## 2016-10-09 VITALS — BP 107/68 | HR 68 | Ht 67.0 in | Wt 162.8 lb

## 2016-10-09 DIAGNOSIS — R569 Unspecified convulsions: Secondary | ICD-10-CM | POA: Diagnosis not present

## 2016-10-09 DIAGNOSIS — I10 Essential (primary) hypertension: Secondary | ICD-10-CM | POA: Diagnosis not present

## 2016-10-09 DIAGNOSIS — I639 Cerebral infarction, unspecified: Secondary | ICD-10-CM | POA: Diagnosis not present

## 2016-10-09 DIAGNOSIS — G8191 Hemiplegia, unspecified affecting right dominant side: Secondary | ICD-10-CM

## 2016-10-09 DIAGNOSIS — I480 Paroxysmal atrial fibrillation: Secondary | ICD-10-CM | POA: Diagnosis not present

## 2016-10-09 DIAGNOSIS — I6381 Other cerebral infarction due to occlusion or stenosis of small artery: Secondary | ICD-10-CM

## 2016-10-09 MED ORDER — ASPIRIN EC 81 MG PO TBEC: 81.0000 mg | DELAYED_RELEASE_TABLET | Freq: Every day | ORAL | Status: AC

## 2016-10-09 NOTE — Patient Instructions (Signed)
-   continue eliquis and lipitor for stroke prevention - will add ASA 81mg  for further stroke prevention.  - continue PT/OT and transition from wheelchair to walker - Follow up with your primary care physician for stroke risk factor modification. Recommend maintain blood pressure goal <130/80, diabetes with hemoglobin A1c goal below 7.0% and lipids with LDL cholesterol goal below 70 mg/dL.  - continue keppra for seizure control.  - check BP at home and avoid hypotension  - follow up with Dr. Letta Pate and orthopedics - follow up in 4 months.

## 2016-10-09 NOTE — Progress Notes (Signed)
STROKE NEUROLOGY FOLLOW UP NOTE  NAME: Herbert Marquez DOB: Dec 28, 1927  REASON FOR VISIT: stroke follow up HISTORY FROM: pt and wife and chart  Today we had the pleasure of seeing Herbert Marquez in follow-up at our Neurology Clinic. Pt was accompanied by wife.   History Summary Mr.Herbert C Jonesis a 83 y.o.malewith history of DM, HTN, HLD, previous stroke in 06/2015 at b/l thalami and right mesial temporal lobe due to left PCA occlusion and right PCA high grade stenosis, afib on 11/28/2015 post right hip fracture surgery on eliquis but later discontinued at follow up clinic and put on DAPT, and CAD s/p PCI admitted on 03/30/16 for speechdifficulties and aphasia. Hereceived IV t-PA. MRI showed left temproal infarct with hemorrhagic conversion and SDH. CTA head and neck 60-70% proximal right ICA and 40-50% left ICA stenosis. EF 65-70% in 11/2015. LDL 38 and A1C 5.0. ASA started 7 days post stroke due to hemorrhagic conversion and eliquis started on 04/13/16. lipitor down from 80mg  to 40mg . He was discharged to CIR.   06/04/16 readmission - pt admitted for acute onset confusion and agitation. MRI punctate right hippocampal infarct likely acute, and old left temporal lobe hemorrhagic infarct. His spell on admission most suggestive of seizure was started on Keppra  at 500 mg twice daily. EEG moderatediffuse slowing. His eliquis and lipitor continued and discharged to CIR.  07/25/16 follow up ([facility]) - Patient returns to the stroke clinic today for follow-up he has not had further stroke TIA symptoms,  he remains on eliquis for secondary stroke prevention and atrial fibrillation. He has not had any further seizure activity and remains on Keppra 500 twice daily. He is on Lipitor for hyperlipidemia without complaints of muscle aches. His Seroquel controls his agitation according to the wife and he sleeps well at night. Appetite is reportedly good. His therapies have concluded. [facility] He has no new neurologic findings.  Interval History During the interval time, the patient has been doing well. He finished home PT/OT and now on outpt PT/OT. Still use wheelchair most time but able to go to bathroom on walker. Bp stable at home 110/70s. No bleeding while on eliquis. Still has mild right hemiparesis. BP today 107/68. Continued on keppra. seroquel works well with him.   REVIEW OF SYSTEMS: Full 14 system review of systems performed and notable only for those listed below and in HPI above, all others are negative:  Constitutional:   Cardiovascular:  Ear/Nose/Throat:  Hearing loss Skin:  Eyes:   Respiratory:   Gastroitestinal:   Genitourinary: frequency urination Hematology/Lymphatic:  Bruise easily Endocrine: cold intolerance Musculoskeletal:  Joint pain, walking difficulty Allergy/Immunology:   Neurological:  Dizziness weakness Psychiatric:  Sleep: frequent waking  The following represents the patient's updated allergies and side effects list: No Known Allergies  The neurologically relevant items on the patient's problem list were reviewed on today's visit.  Neurologic Examination  A problem focused neurological exam (12 or more points of the single system neurologic examination, vital signs counts as 1 point, cranial nerves count for 8 points) was performed.  Blood pressure 107/68, pulse 68, height 5\' 7"  (1.702 m), weight 162 lb 12.8 oz (73.8 kg).  General - Well nourished, well developed, in no apparent distress.  Ophthalmologic - Fundi not visualized due to eye movement.  Cardiovascular - irregularly irregular heart rate and rhythm.  Mental Status -  Level of arousal and orientation to time, place,  and person were intact. Language including expression, naming, repetition, comprehension was assessed and found intact. Fund of Knowledge was assessed and was intact.  Cranial Nerves II - XII - II - Visual field intact OU. III, IV, VI - Extraocular  movements intact. V - Facial sensation intact bilaterally. VII - Facial movement intact bilaterally. VIII - Hearing & vestibular intact bilaterally. X - Palate elevates symmetrically. XI - Chin turning & shoulder shrug intact bilaterally. XII - Tongue protrusion intact.  Motor Strength - The patient's strength was normal in LUE and LLE, however, RUE pronator drift and right hand mild dexterity difficulty, RLE 4/5.  Bulk was normal and fasciculations were absent.   Motor Tone - Muscle tone was assessed at the neck and appendages and was normal.  Reflexes - The patient's reflexes were 1+ in all extremities and he had no pathological reflexes.  Sensory - Light touch, temperature/pinprick were assessed and were normal.    Coordination - The patient had right FTN mild dysmetria.  Tremor was absent.  Gait and Station - not tested, in wheelchair.   Data reviewed: I personally reviewed the images and agree with the radiology interpretations.  Ct Head Code Stroke W/o Cm 03/30/2016 1. No evidence of acute intracranial abnormality.  2. ASPECTS is 10.  3. Moderate chronic small vessel ischemic disease and chronic left thalamic infarct.   MRI Head  03/31/2016 Limited examination. Marked motion degrades image quality. No acute abnormality. But able to tell there is left temporal acute infarct with hemorrhagic transformation.  CTA Head and Neck 03/31/1016 1. Left temporal lobe hemorrhagic infarct.The parenchymal hematoma measures 4 cc volume.Subdural extension in the middle cranial fossawithout significant mass effect. 2. No emergent large vessel occlusion. 3. Atherosclerosis with 60-70% proximal right ICA and 40-50% proximal left ICA stenosis. Mild moderate right supraclinoid ICA stenosis. No significant stenosis in the posterior circulation. 4. Pulmonary fibrosis.  TTE 06/09/16 Left ventricle: The cavity size was normal. Systolic function was   normal. The estimated ejection fraction  was in the range of 55%   to 60%. Wall motion was normal; there were no regional wall   motion abnormalities. Doppler parameters are consistent with   abnormal left ventricular relaxation (grade 1 diastolic   dysfunction). There was no evidence of elevated ventricular   filling pressure by Doppler parameters. - Aortic valve: There was no regurgitation. - Aortic root: The aortic root was normal in size. - Mitral valve: There was trivial regurgitation. - Left atrium: The atrium was mildly dilated. - Right ventricle: Systolic function was normal. - Tricuspid valve: There was trivial regurgitation. - Pulmonary arteries: Systolic pressure was within the normal   range. - Inferior vena cava: The vessel was normal in size. - Pericardium, extracardiac: There was no pericardial effusion.  EEG 06/05/16 - This awake EEG is abnormal due to moderate diffuse slowing of the waking background.  CT head 04/12/16 1. Evolving hemorrhage of the left temporal lobe. 2. Persistent surrounding edema without significant midline shift or mass effect. 3. No new hemorrhage. 4. Atrophy and small vessel disease.  MRI and MRA brain 06/07/16 MRI HEAD: 4 mm RIGHT hippocampal infarct is likely acute. Old LEFT temporal lobe hemorrhage. Moderate chronic small vessel ischemic disease. Old LEFT thalamus lacunar infarct. MRA HEAD: No emergent large vessel occlusion or severe stenosis.  Component     Latest Ref Rng & Units 03/31/2016 04/01/2016 06/04/2016 06/05/2016  Cholesterol     0 - 200 mg/dL 77     Triglycerides     <  150 mg/dL 37     HDL Cholesterol     >40 mg/dL 32 (L)     Total CHOL/HDL Ratio     RATIO 2.4     VLDL     0 - 40 mg/dL 7     LDL (calc)     0 - 99 mg/dL 38     Hemoglobin A1C      5.0     Mean Plasma Glucose     mg/dL 97     TSH     0.350 - 4.500 uIU/mL  2.423 3.437   Vitamin B12     180 - 914 pg/mL  1,477 (H) 755 736  HIV Screen 4th Generation wRfx     Non Reactive    Non Reactive  Ammonia      9 - 35 umol/L    37 (H)   Component     Latest Ref Rng & Units 10/01/2016  Cholesterol     0 - 200 mg/dL   Triglycerides     <150 mg/dL   HDL Cholesterol     >40 mg/dL   Total CHOL/HDL Ratio     RATIO   VLDL     0 - 40 mg/dL   LDL (calc)     0 - 99 mg/dL   Hemoglobin A1C      5.6  Mean Plasma Glucose     mg/dL   TSH     0.350 - 4.500 uIU/mL   Vitamin B12     180 - 914 pg/mL   HIV Screen 4th Generation wRfx     Non Reactive   Ammonia     9 - 35 umol/L     Assessment: As you may recall, he is a 83 y.o. Caucasian male with PMH of DM, HTN, HLD, previous stroke in 06/2015 at b/l thalami and right mesial temporal lobe due to left PCA occlusion and right PCA high grade stenosis, afib on 11/28/2015 post right hip fracture surgery on eliquis but later discontinued at follow up clinic and put on DAPT, and CAD s/p PCI admitted on 03/30/16 for speechdifficulties and aphasia. Hereceived IV t-PA. MRI showed left temproal infarct with hemorrhagic conversion and SDH. CTA head and neck 60-70% proximal right ICA and 40-50% left ICA stenosis. EF 65-70% in 11/2015. LDL 38 and A1C 5.0. ASA started 7 days post stroke due to hemorrhagic conversion and eliquis started on 04/13/16. lipitor down from 80mg  to 40mg . He was discharged to CIR. Pt re-admitted on 06/04/16 for acute onset confusion and agitation. MRI punctate right hippocampal infarct likely acute, and old left temporal lobe hemorrhagic infarct. His spell on admission most suggestive of seizure was started on Keppra  at 500 mg twice daily. EEG moderatediffuse slowing. His eliquis and lipitor continued and discharged to CIR. He now finished home PT/OT and now on outpt PT/OT. Still use wheelchair most time but able to go to bathroom on walker. Still has mild right hemiparesis. Continued on keppra. seroquel works well with him. His stroke in 06/2016 felt to be incidental and at right PCA territory and likely due to chronic right PCA high grade stenosis, not at  right ICA territory, no need right ICA intervention at this time. Will add ASA 81mg  on top of eliquis.   Plan:  - continue eliquis and lipitor for stroke prevention - will add ASA 81mg  for further stroke prevention.  - continue PT/OT and transition from wheelchair to walker - Follow up with your  primary care physician for stroke risk factor modification. Recommend maintain blood pressure goal <130/80, diabetes with hemoglobin A1c goal below 7.0% and lipids with LDL cholesterol goal below 70 mg/dL.  - continue keppra for seizure control.  - check BP at home and avoid hypotension  - follow up with Dr. Letta Pate and orthopedics - follow up in 4 months.  I spent more than 25 minutes of face to face time with the patient. Greater than 50% of time was spent in counseling and coordination of care. We reviewed neuro images, discussed adding ASA to eliquis, and continue PT/OT.    No orders of the defined types were placed in this encounter.   Meds ordered this encounter  Medications  . aspirin EC 81 MG tablet    Sig: Take 1 tablet (81 mg total) by mouth daily.    Patient Instructions  - continue eliquis and lipitor for stroke prevention - will add ASA 81mg  for further stroke prevention.  - continue PT/OT and transition from wheelchair to walker - Follow up with your primary care physician for stroke risk factor modification. Recommend maintain blood pressure goal <130/80, diabetes with hemoglobin A1c goal below 7.0% and lipids with LDL cholesterol goal below 70 mg/dL.  - continue keppra for seizure control.  - check BP at home and avoid hypotension  - follow up with Dr. Letta Pate and orthopedics - follow up in 4 months.    Rosalin Hawking, MD PhD [facility] [address] [place], [place] [address] [phone]

## 2016-10-09 NOTE — Progress Notes (Signed)
Follow up with your primary care physician for stroke risk factor modification. Recommend maintain blood pressure goal <130/80, diabetes with hemoglobin A1c goal below 7.0% and lipids with LDL cholesterol goal below 70 mg/dL.  Per Neuro Recommendations

## 2016-10-11 ENCOUNTER — Encounter: Payer: Self-pay | Admitting: Physical Therapy

## 2016-10-11 ENCOUNTER — Ambulatory Visit: Payer: Medicare Other | Admitting: Physical Therapy

## 2016-10-11 DIAGNOSIS — R2681 Unsteadiness on feet: Secondary | ICD-10-CM

## 2016-10-11 DIAGNOSIS — R2689 Other abnormalities of gait and mobility: Secondary | ICD-10-CM

## 2016-10-11 DIAGNOSIS — M6281 Muscle weakness (generalized): Secondary | ICD-10-CM | POA: Diagnosis not present

## 2016-10-11 NOTE — Therapy (Signed)
[facility] [address] [place], [place], [address] Phone: [phone]   Fax:  [phone]  Physical Therapy Treatment  Patient Details  Name: Herbert Marquez MRN: [id number] Date of Birth: [birth year] Referring Provider: Alysia Penna, MD  Encounter Date: 10/11/2016      PT End of Session - 10/11/16 1237    Visit Number 11   Number of Visits 16   Date for PT Re-Evaluation 10/13/16   Authorization Type G code every 10th visit   PT Start Time 1104   PT Stop Time 1145   PT Time Calculation (min) 41 min   Equipment Utilized During Treatment Gait belt   Activity Tolerance Patient tolerated treatment well   Behavior During Therapy [facility] for tasks assessed/performed      Past Medical History:  Diagnosis Date  . Arthritis    "pretty much all over"   . Basal cell carcinoma    "several burned off his body, face, head"  . BPH (benign prostatic hypertrophy)   . Coronary artery disease    a. s/p PCI of RCA in 2006  . CVA (cerebral infarction)    a. 06/2015: left thalamic and bilateral PCA  . GERD (gastroesophageal reflux disease)   . Hyperlipidemia   . Hypertension   . Stroke ([place])   . TIA (transient ischemic attack)    Approximately 6 weeks post-cardiac catheterization.     Past Surgical History:  Procedure Laterality Date  . CARDIOVASCULAR STRESS TEST  07/01/2007   EF 74%  . CATARACT EXTRACTION, BILATERAL    . CORONARY ANGIOPLASTY WITH STENT PLACEMENT  10/2004   stenting x 2 to RCA  . FEMUR IM NAIL Right 11/26/2015   Procedure: INTRAMEDULLARY RIGHT (IM) NAIL FEMORAL;  Surgeon: Rod Can, MD;  Location: [facility];  Service: Orthopedics;  Laterality: Right;  . HERNIA REPAIR    . HIP ARTHROPLASTY  03/09/2011   Procedure: ARTHROPLASTY BIPOLAR HIP;  Surgeon: Mauri Pole;  Location: [facility];  Service: Orthopedics;  Laterality: Left;  . LAPAROSCOPIC INCISIONAL / UMBILICAL / VENTRAL HERNIA REPAIR     "below his  naval"    There were no vitals filed for this visit.      Subjective Assessment - 10/11/16 1112    Subjective States not sleeping well. Feels his walking is doing better.   Pertinent History L hip replace, R hip fracture, h/o multiple CVAs, vascular dementia.   Patient Stated Goals Trying to get back to walking.   Currently in Pain? Yes   Pain Score 5    Pain Location Knee   Pain Orientation Right   Pain Descriptors / Indicators Aching   Pain Type Chronic pain                         [facility] Adult PT Treatment/Exercise - 10/11/16 1204      Transfers   Transfers Sit to Stand;Stand to [employer] Transfers   Sit to Stand 6: Modified independent (Device/Increase time);4: Min assist;With upper extremity assist   Sit to Stand Details (indicate cue type and reason) required assist for stand-pivot onto and off rollator seat; vc for sequencing   Stand to Sit 6: Modified independent (Device/Increase time);4: Min assist   Stand to Sit Details required assist for stand-pivot onto and off rollator seat; vc for sequencing     Ambulation/Gait   Ambulation/Gait Yes   Ambulation/Gait Assistance 4: Min guard   Ambulation/Gait Assistance Details initial  100 ft, no dragging of rt foot; on 2nd and 3rd walks foot began dragging after ~80 ft with LOB forward x 1 (pt caught himself with Rollator); vc for heelstrike on rt and facilitation for lt hip extension/stability   Ambulation Distance (Feet) 100 Feet  240, 90, 200   Assistive device Rollator   Ambulation Surface Level;Indoor;Outdoor;Paved   Ramp 4: Min assist   Ramp Details (indicate cue type and reason) indoor ramp x 2 with vc for distance to rollator; pt using step-to pattern and use of brakes to steady walker as he puts pressure through hands; assist to stabilize walker x 2    Gait Comments at end of session walked pt out to his car     Knee/Hip Exercises: Seated   Hamstring Curl Strengthening;Right;10 reps  green band                 PT Education - 10/11/16 1231    Education provided Yes   Education Details Educated on possible benefit from leather toe cap on rt shoe; educated will be discussed with his primary PT and she will continue discussion on his next visit   Person(s) Educated Patient;Spouse   Methods Explanation   Comprehension Verbalized understanding          PT Short Term Goals - 09/14/16 1152      PT SHORT TERM GOAL #1   Title The patient will perform HEP with assist from wife as needed for LE strengthening, standing balance and general mobility.  TARGET DATE FOR ALL STGs:  09/13/16   Baseline Patient has HEP that his wife is assisting with--PT to continue to progress.    Time 4   Period Weeks   Status Achieved     PT SHORT TERM GOAL #2   Title The patient will improve Berg score from 11/56 to > or equal to 20/56 to demo improving ability stand without UE support for ADLs.   Baseline Improved from 11/56 up to 17/56.    Time 4   Period Weeks   Status Partially Met     PT SHORT TERM GOAL #3   Title The patient will improve gait speed from 0.65 ft/sec to > or equal to 1.3 ft/sec to demo improved household ambulation (with least restrictive assistive device).   Baseline Improved from 0.65 ft/sec up to 0.89 ft/sec.   Time 4   Period Weeks   Status Partially Met     PT SHORT TERM GOAL #4   Title The patient will perform sit<>stand with UE support modified indep.   Baseline PT provides supervision consistently.     Time 4   Period Weeks   Status Partially Met     PT SHORT TERM GOAL #5   Title The patient will be furthe assessed on stairs and LTG to follow.   Baseline The patient requires min A for stairs x 4 steps.   Time 4   Period Weeks   Status Achieved     PT SHORT TERM GOAL #6   Title The patient will ambulate for 150 ft with least restrictive device modified indep.   Baseline Patient has improved to CGA for gait x 115 feet nonstop.   Time 8   Period Weeks    Status Partially Met           PT Long Term Goals - 08/14/16 2111      PT LONG TERM GOAL #1   Title The patient will be indep with  progression of HEP.  TARGET DATE FOR ALL LTGs:  10/14/16   Time 8   Period Weeks     PT LONG TERM GOAL #2   Title The patient will improve Berg score from 11/56 to > or equal to 30/56 to demo improving balance for ADLs.    Time 8   Period Weeks     PT LONG TERM GOAL #3   Title The patient will improve gait speed from 0.65 ft/sec to > or equal to 1.6 ft/sec to demo improving mobility.   Time 8   Period Weeks     PT LONG TERM GOAL #4   Title The patient will move sit<>stand x 5 times with UE support modified indep without use of device to stabilize upon rising to demo improving balance during transitional movements.   Time 8   Period Weeks     PT LONG TERM GOAL #5   Title The patient will ambulate x 400 ft nonstop with least restrictive assistive device modified indep for improved assess to home/limited community surfaces.   Time 8   Period Weeks     Additional Long Term Goals   Additional Long Term Goals Yes     PT LONG TERM GOAL #6   Title LTG to be written for stair negotiation.               Plan - 10/11/16 1255    Clinical Impression Statement Session focused on gait training with rollator, including transfers onto mat table and onto rollator seat. Patient required vc with each transfer for safe sequencing and physical assist for balance with turn to sit on rollator. Patient continues to drag Rt foot when he fatigues and introduced idea that he may benefit from a leather toe cap. Patient making steady progress and will continue to benefit from PT.    Rehab Potential Good   Clinical Impairments Affecting Rehab Potential Barriers to rehab include multiple CVAs, diminished sensation.  Enhancers to rehab potential include family support, patient motivation.   PT Frequency 2x / week   PT Duration 8 weeks   PT Treatment/Interventions  ADLs/Self Care Home Management;Therapeutic activities;Therapeutic exercise;Neuromuscular re-education;Patient/family education;Functional mobility training;Gait training;DME Instruction;Stair training;Manual techniques;Orthotic Fit/Training;Wheelchair mobility training   PT Next Visit Plan Check LTGs and re-certify; f/u discussion re: leather toe cap; Work on walking into/out of clinic, dec'd dependence on w/c and increasing distance for limited community mobility.    Consulted and Agree with Plan of Care Patient;Family member/caregiver   Family Member Consulted spouse      Patient will benefit from skilled therapeutic intervention in order to improve the following deficits and impairments:  Abnormal gait, Decreased balance, Difficulty walking, Decreased strength, Decreased mobility, Impaired sensation, Postural dysfunction, Impaired flexibility, Pain  Visit Diagnosis: Other abnormalities of gait and mobility  Unsteadiness on feet  Muscle weakness (generalized)     Problem List Patient Active Problem List   Diagnosis Date Noted  . Seizures ([place]) 10/09/2016  . Healthcare maintenance 10/01/2016  . Restlessness and agitation 07/02/2016  . Vascular dementia with behavior disturbance 06/29/2016  . Hemiparesis and other late effects of cerebrovascular accident ([place]) 06/29/2016  . Embolic stroke ([place]) 43/15/4008  . Right hemiparesis ([place])   . History of CVA with residual deficit   . Chronic anticoagulation   . Atrial fibrillation with rapid ventricular response ([place])   . Seizure prophylaxis   . Diastolic dysfunction   . Bacteremia due to Gram-positive bacteria   . Altered mental status   .  Dementia without behavioral disturbance   . Leukocytosis   . Acute blood loss anemia   . Acute encephalopathy 06/04/2016  . PAF (paroxysmal atrial fibrillation) ([place])   . Iron deficiency anemia 04/04/2016  . History of CVA (cerebrovascular accident) 04/04/2016  . Cerebral hemorrhage ([facility]) w/ SDH  s/p IV tPA   . Coronary artery disease involving native coronary artery of native heart without angina pectoris   . Orthostatic hypotension   . History of right hip replacement   . Acute ischemic stroke ([place]) - L temporal lobe s/p tPA 03/30/2016  . BPH (benign prostatic hyperplasia) 11/25/2015  . GERD (gastroesophageal reflux disease) 11/25/2015  . CKD (chronic kidney disease), stage II 11/25/2015  . Overactive bladder   . Cerebrovascular accident (CVA) ([place]) 09/18/2015  . Gait disturbance, post-stroke 06/23/2015  . 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 Thalamic infarction ([place]) 06/21/2015  . Benign essential HTN   . Type 2 diabetes mellitus with complication, without long-term current use of insulin ([place])   . Dysphagia as late effect of cerebrovascular disease   . Hyponatremia   . HLD (hyperlipidemia)   . COPD (chronic obstructive pulmonary disease) ([place]) 03/09/2011    Rexanne Mano, PT 10/11/2016, 1:04 PM  [facility] [address] [place], [place], [address] Phone: [phone]   Fax:  [phone]  Name: DELQUAN POUCHER MRN: [id number] Date of Birth: 1927/03/30

## 2016-10-12 ENCOUNTER — Other Ambulatory Visit: Payer: Self-pay | Admitting: Physical Medicine & Rehabilitation

## 2016-10-15 ENCOUNTER — Ambulatory Visit: Payer: Medicare Other | Admitting: Rehabilitative and Restorative Service Providers"

## 2016-10-15 DIAGNOSIS — R2689 Other abnormalities of gait and mobility: Secondary | ICD-10-CM | POA: Diagnosis not present

## 2016-10-15 DIAGNOSIS — R2681 Unsteadiness on feet: Secondary | ICD-10-CM | POA: Diagnosis not present

## 2016-10-15 DIAGNOSIS — M6281 Muscle weakness (generalized): Secondary | ICD-10-CM

## 2016-10-16 ENCOUNTER — Other Ambulatory Visit: Payer: Self-pay | Admitting: Physical Medicine & Rehabilitation

## 2016-10-16 NOTE — Therapy (Signed)
[facility] [address] [place], [place], [address] Phone: [phone]   Fax:  [phone]  Physical Therapy Treatment  Patient Details  Name: Herbert Marquez MRN: [id number] Date of Birth: 1927-04-15 Referring Provider: Alysia Penna, MD  Encounter Date: 10/15/2016      PT End of Session - 10/15/16 1325    Visit Number 12   Number of Visits 20   Date for PT Re-Evaluation 11/15/16   Authorization Type G code every 10th visit   PT Start Time 1153   PT Stop Time 1235   PT Time Calculation (min) 42 min   Equipment Utilized During Treatment Gait belt   Activity Tolerance Patient tolerated treatment well   Behavior During Therapy [facility] for tasks assessed/performed      Past Medical History:  Diagnosis Date  . Arthritis    "pretty much all over"   . Basal cell carcinoma    "several burned off his body, face, head"  . BPH (benign prostatic hypertrophy)   . Coronary artery disease    a. s/p PCI of RCA in 2006  . CVA (cerebral infarction)    a. 06/2015: left thalamic and bilateral PCA  . GERD (gastroesophageal reflux disease)   . Hyperlipidemia   . Hypertension   . Stroke ([place])   . TIA (transient ischemic attack)    Approximately 6 weeks post-cardiac catheterization.     Past Surgical History:  Procedure Laterality Date  . CARDIOVASCULAR STRESS TEST  07/01/2007   EF 74%  . CATARACT EXTRACTION, BILATERAL    . CORONARY ANGIOPLASTY WITH STENT PLACEMENT  10/2004   stenting x 2 to RCA  . FEMUR IM NAIL Right 11/26/2015   Procedure: INTRAMEDULLARY RIGHT (IM) NAIL FEMORAL;  Surgeon: Rod Can, MD;  Location: [facility];  Service: Orthopedics;  Laterality: Right;  . HERNIA REPAIR    . HIP ARTHROPLASTY  03/09/2011   Procedure: ARTHROPLASTY BIPOLAR HIP;  Surgeon: Mauri Pole;  Location: [facility];  Service: Orthopedics;  Laterality: Left;  . LAPAROSCOPIC INCISIONAL / UMBILICAL / VENTRAL HERNIA REPAIR     "below his  naval"    There were no vitals filed for this visit.      Subjective Assessment - 10/15/16 1158    Subjective The patient reports that he is walking more at home. He was able to walk at home up and down his ramp with rolling walker and his wife.  We discussed return to community mobility and wife explained that he was using w/c/ transport chair in community since 06/2015.  PT discussed goal for therapy may be limited community mobility.    Pertinent History L hip replace, R hip fracture, h/o multiple CVAs, vascular dementia.   Patient Stated Goals Trying to get back to walking.   Currently in Pain? Yes   Pain Score 3    Pain Location Knee   Pain Orientation Right   Pain Descriptors / Indicators Aching   Pain Type Chronic pain   Pain Onset More than a month ago   Pain Frequency Intermittent   Aggravating Factors  PT to monitor response to treatment.            [facility] PT Assessment - 10/15/16 1206      Ambulation/Gait   Ambulation/Gait Yes   Ambulation/Gait Assistance 5: Supervision   Ambulation Distance (Feet) 270 Feet  then walked 150 ft   Assistive device Rollator   Gait velocity 1.13 ft/sec   Gait Comments Discussed  R foot toe cap.  At this time, patient does not wish to pursue.     Standardized Balance Assessment   Standardized Balance Assessment Berg Balance Test     Berg Balance Test   Sit to Stand Able to stand using hands after several tries   Standing Unsupported Able to stand 2 minutes with supervision   Sitting with Back Unsupported but Feet Supported on Floor or Stool Able to sit safely and securely 2 minutes   Stand to Sit Controls descent by using hands   Transfers Able to transfer with verbal cueing and /or supervision   Standing Unsupported with Eyes Closed Able to stand 10 seconds with supervision   Standing Ubsupported with Feet Together Needs help to attain position but able to stand for 30 seconds with feet together   From Standing, Reach Forward with  Outstretched Arm Reaches forward but needs supervision   From Standing Position, Pick up Object from Floor Unable to try/needs assist to keep balance   From Standing Position, Turn to Look Behind Over each Shoulder Needs assist to keep from losing balance and falling   Turn 360 Degrees Needs assistance while turning   Standing Unsupported, Alternately Place Feet on Step/Stool Needs assistance to keep from falling or unable to try   Standing Unsupported, One Foot in [employer] balance while stepping or standing   Standing on One Leg Unable to try or needs assist to prevent fall   Total Score 19   Berg comment: 19/56                     [facility] Adult PT Treatment/Exercise - 10/15/16 1206      Transfers   Sit to Stand 6: Modified independent (Device/Increase time)   Sit to Stand Details (indicate cue type and reason) Patient performs x 5 reps and leans his knees against mat for stability when not using UEs through walker to stabilize     Self-Care   Self-Care Other Self-Care Comments   Other Self-Care Comments  PT discussed increasing walking at home from 1x/day x 200 ft up to 2x/day with goal to rely less on w/c and more on walker for household mobility.  Also, CLARIFIED PATIENT'S PRIOR AMBULATORY STATUS.  PT initially was under impression that patient was walking with SPC in community after first stroke.  He was using [facility] for short, household distances and has only used the w/c in community since his first stroke in 06/2015.  He then sustained a fall with hip fx and also had a 2nd stroke.  Therefore, feel that increasing household ambulation is main goal with continued use of w/c for community.                  PT Education - 10/15/16 1324    Education provided Yes   Education Details Discussed increasing walking distance in the home   Person(s) Educated Patient;Spouse   Methods Explanation;Demonstration;Handout   Comprehension Verbalized understanding;Returned  demonstration          PT Short Term Goals - 09/14/16 1152      PT SHORT TERM GOAL #1   Title The patient will perform HEP with assist from wife as needed for LE strengthening, standing balance and general mobility.  TARGET DATE FOR ALL STGs:  09/13/16   Baseline Patient has HEP that his wife is assisting with--PT to continue to progress.    Time 4   Period Weeks   Status Achieved  PT SHORT TERM GOAL #2   Title The patient will improve Berg score from 11/56 to > or equal to 20/56 to demo improving ability stand without UE support for ADLs.   Baseline Improved from 11/56 up to 17/56.    Time 4   Period Weeks   Status Partially Met     PT SHORT TERM GOAL #3   Title The patient will improve gait speed from 0.65 ft/sec to > or equal to 1.3 ft/sec to demo improved household ambulation (with least restrictive assistive device).   Baseline Improved from 0.65 ft/sec up to 0.89 ft/sec.   Time 4   Period Weeks   Status Partially Met     PT SHORT TERM GOAL #4   Title The patient will perform sit<>stand with UE support modified indep.   Baseline PT provides supervision consistently.     Time 4   Period Weeks   Status Partially Met     PT SHORT TERM GOAL #5   Title The patient will be furthe assessed on stairs and LTG to follow.   Baseline The patient requires min A for stairs x 4 steps.   Time 4   Period Weeks   Status Achieved     PT SHORT TERM GOAL #6   Title The patient will ambulate for 150 ft with least restrictive device modified indep.   Baseline Patient has improved to CGA for gait x 115 feet nonstop.   Time 8   Period Weeks   Status Partially Met           PT Long Term Goals - 10/15/16 1225      PT LONG TERM GOAL #1   Title The patient will be indep with progression of HEP.  TARGET DATE FOR ALL LTGs:  10/14/16   Baseline Patient's wife assists with all home exercises.   Time 8   Period Weeks   Status Partially Met     PT LONG TERM GOAL #2   Title The  patient will improve Berg score from 11/56 to > or equal to 30/56 to demo improving balance for ADLs.    Baseline Improved from 11/56 up to 19/56.   Time 8   Period Weeks   Status Partially Met     PT LONG TERM GOAL #3   Title The patient will improve gait speed from 0.65 ft/sec to > or equal to 1.6 ft/sec to demo improving mobility.   Baseline 1.13 ft/sec on 10/15/16   Time 8   Period Weeks   Status Partially Met     PT LONG TERM GOAL #4   Title The patient will move sit<>stand x 5 times with UE support modified indep without use of device to stabilize upon rising to demo improving balance during transitional movements.   Baseline Patient uses UEs and knees against surface for support or can avoid using knees if walker present to use.   Time 8   Period Weeks   Status Achieved     PT LONG TERM GOAL #5   Title The patient will ambulate x 400 ft nonstop with least restrictive assistive device modified indep for improved assess to home/limited community surfaces.   Baseline  Patient is now able to ambulate 270 feet nonstop.    Time 8   Period Weeks   Status Partially Met     PT LONG TERM GOAL #6   Title LTG to be written for stair negotiation.   Baseline Patient has ramp  entry at home.   Status Deferred       UPDATED LONG TERM GOALS:     PT Long Term Goals - 10/16/16 0943      PT LONG TERM GOAL #1   Title The patient will perform HEP with assist from spouse.   Time 4   Period Weeks   Status New   Target Date 11/15/16     PT LONG TERM GOAL #2   Title The patient will increase ambulation to move x 350 ft nonstop to demo improved household ambulation using rollater RW with supervision.   Time 4   Period Weeks   Status New   Target Date 11/15/16     PT LONG TERM GOAL #3   Title The patient will improve gait speed from 1.13 ft/sec up to 1.4 ft/sec to demo improving mobility with rollater RW.   Time 4   Period Weeks   Status New   Target Date 11/15/16     PT LONG  TERM GOAL #4   Title The patient will negotiate obstacles to mimic household surfaces (narrow walking spaces, turning in tight spaces, etc) with rollater RW and supervision for improved safety for home.   Time 4   Period Weeks   Status New   Target Date 11/15/16     PT LONG TERM GOAL #5   Title Improve Berg from 19/56 up to 22/56 to demo improving standing balance for ADLs.   Time 4   Period Weeks   Status New   Target Date 11/15/16             Plan - 10/16/16 0940    Clinical Impression Statement The patient has partially met LTGs.  PT to continue x 4 more weeks emphasizing transition of household ambulation from blocked/exercise practice to more functional for short distances from bathroom to living room, etc.     Clinical Impairments Affecting Rehab Potential Barriers to rehab include multiple CVAs, diminished sensation.  Enhancers to rehab potential include family support, patient motivation.   PT Treatment/Interventions ADLs/Self Care Home Management;Therapeutic activities;Therapeutic exercise;Neuromuscular re-education;Patient/family education;Functional mobility training;Gait training;DME Instruction;Stair training;Manual techniques;Orthotic Fit/Training;Wheelchair mobility training   PT Next Visit Plan Emphasize household ambulation and increasing distances/managing obstacles for home.  R knee stretching/strengthening, review prior HEP and progress as able.    Consulted and Agree with Plan of Care Patient;Family member/caregiver   Family Member Consulted spouse      Patient will benefit from skilled therapeutic intervention in order to improve the following deficits and impairments:  Abnormal gait, Decreased balance, Difficulty walking, Decreased strength, Decreased mobility, Impaired sensation, Postural dysfunction, Impaired flexibility, Pain  Visit Diagnosis: Other abnormalities of gait and mobility  Muscle weakness (generalized)  Unsteadiness on feet     Problem  List Patient Active Problem List   Diagnosis Date Noted  . Seizures ([place]) 10/09/2016  . Healthcare maintenance 10/01/2016  . Restlessness and agitation 07/02/2016  . Vascular dementia with behavior disturbance 06/29/2016  . Hemiparesis and other late effects of cerebrovascular accident ([place]) 06/29/2016  . Embolic stroke ([place]) 32/95/1884  . Right hemiparesis ([place])   . History of CVA with residual deficit   . Chronic anticoagulation   . Atrial fibrillation with rapid ventricular response ([place])   . Seizure prophylaxis   . Diastolic dysfunction   . Bacteremia due to Gram-positive bacteria   . Altered mental status   . Dementia without behavioral disturbance   . Leukocytosis   . Acute blood loss anemia   .  Acute encephalopathy 06/04/2016  . PAF (paroxysmal atrial fibrillation) ([place])   . Iron deficiency anemia 04/04/2016  . History of CVA (cerebrovascular accident) 04/04/2016  . Cerebral hemorrhage (HCC) w/ SDH s/p IV tPA   . Coronary artery disease involving native coronary artery of native heart without angina pectoris   . Orthostatic hypotension   . History of right hip replacement   . Acute ischemic stroke ([place]) - L temporal lobe s/p tPA 03/30/2016  . BPH (benign prostatic hyperplasia) 11/25/2015  . GERD (gastroesophageal reflux disease) 11/25/2015  . CKD (chronic kidney disease), stage II 11/25/2015  . Overactive bladder   . Cerebrovascular accident (CVA) ([place]) 09/18/2015  . Gait disturbance, post-stroke 06/23/2015  . 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 Thalamic infarction ([place]) 06/21/2015  . Benign essential HTN   . Type 2 diabetes mellitus with complication, without long-term current use of insulin ([place])   . Dysphagia as late effect of cerebrovascular disease   . Hyponatremia   . HLD (hyperlipidemia)   . COPD (chronic obstructive pulmonary disease) ([place]) 03/09/2011    Masao Junker, PT 10/16/2016, 9:43 AM  [facility] [address] [place], [place], [address] Phone: [phone]   Fax:  [phone]  Name: Herbert Marquez MRN: [id number] Date of Birth: [birth year]

## 2016-10-19 ENCOUNTER — Ambulatory Visit: Payer: Medicare Other | Admitting: Rehabilitative and Restorative Service Providers"

## 2016-10-19 DIAGNOSIS — R2681 Unsteadiness on feet: Secondary | ICD-10-CM

## 2016-10-19 DIAGNOSIS — M6281 Muscle weakness (generalized): Secondary | ICD-10-CM

## 2016-10-19 DIAGNOSIS — R2689 Other abnormalities of gait and mobility: Secondary | ICD-10-CM | POA: Diagnosis not present

## 2016-10-19 NOTE — Therapy (Signed)
[facility] [address] [place], [place], [address] Phone: [phone]   Fax:  [phone]  Physical Therapy Treatment  Patient Details  Name: Herbert Marquez MRN: [id number] Date of Birth: 12-Mar-1927 Referring Provider: Alysia Penna, MD  Encounter Date: 10/19/2016      PT End of Session - 10/19/16 1628    Visit Number 13   Number of Visits 20   Date for PT Re-Evaluation 11/15/16   Authorization Type G code every 10th visit   PT Start Time 1108   PT Stop Time 1150   PT Time Calculation (min) 42 min   Equipment Utilized During Treatment Gait belt   Activity Tolerance Patient tolerated treatment well   Behavior During Therapy [facility] for tasks assessed/performed      Past Medical History:  Diagnosis Date  . Arthritis    "pretty much all over"   . Basal cell carcinoma    "several burned off his body, face, head"  . BPH (benign prostatic hypertrophy)   . Coronary artery disease    a. s/p PCI of RCA in 2006  . CVA (cerebral infarction)    a. 06/2015: left thalamic and bilateral PCA  . GERD (gastroesophageal reflux disease)   . Hyperlipidemia   . Hypertension   . Stroke ([place])   . TIA (transient ischemic attack)    Approximately 6 weeks post-cardiac catheterization.     Past Surgical History:  Procedure Laterality Date  . CARDIOVASCULAR STRESS TEST  07/01/2007   EF 74%  . CATARACT EXTRACTION, BILATERAL    . CORONARY ANGIOPLASTY WITH STENT PLACEMENT  10/2004   stenting x 2 to RCA  . FEMUR IM NAIL Right 11/26/2015   Procedure: INTRAMEDULLARY RIGHT (IM) NAIL FEMORAL;  Surgeon: Rod Can, MD;  Location: [facility];  Service: Orthopedics;  Laterality: Right;  . HERNIA REPAIR    . HIP ARTHROPLASTY  03/09/2011   Procedure: ARTHROPLASTY BIPOLAR HIP;  Surgeon: Mauri Pole;  Location: [facility];  Service: Orthopedics;  Laterality: Left;  . LAPAROSCOPIC INCISIONAL / UMBILICAL / VENTRAL HERNIA REPAIR     "below his  naval"    There were no vitals filed for this visit.      Subjective Assessment - 10/19/16 1306    Subjective The patient has increased with walking each day in the home with wife's supervision.   Pertinent History L hip replace, R hip fracture, h/o multiple CVAs, vascular dementia.   Patient Stated Goals Trying to get back to walking.   Currently in Pain? --  see prior daily note            [facility] PT Assessment - 10/19/16 1307      Ambulation/Gait   Ambulation Distance (Feet) 150 Feet  x 2 reps, 75 ft, 200 ft to car from back of clinic nonstop.            Vestibular Assessment - 10/19/16 1630      Positional Testing   Sidelying Test Sidelying Right;Sidelying Left     Sidelying Right   Sidelying Right Duration none   Sidelying Right Symptoms No nystagmus     Sidelying Left   Sidelying Left Duration none   Sidelying Left Symptoms No nystagmus                 [facility] Adult PT Treatment/Exercise - 10/19/16 1307      Ambulation/Gait   Ambulation/Gait Yes   Ambulation/Gait Assistance 5: Supervision   Assistive device Rollator  Gait Comments The patient reports that he increased home walking.  PT discussed at length changing emphasis from gait for exercise to mobility.  Patient walked into/out of clinic today without w/c use.  Also worked on tight space negotiation to mimic household ambulation with turns, backing up to sit in chairs with obstacles nearby.     Neuro Re-ed    Neuro Re-ed Details  Standing activities emphasizing R knee elongation with reaching with heel to encourage improved heel strike with ambulation activities.      Exercises   Other Exercises  Seated hamstring stretching with tactile cues to avoid ER of hip.  Standing reaching at countertop to work on upright posture for ADLs.                 PT Education - 10/19/16 1627    Education provided Yes   Education Details Discussed walking more in home and performing standing  reaching activities at [facility].   Person(s) Educated Patient   Methods Explanation;Demonstration;Handout   Comprehension Verbalized understanding;Returned demonstration          PT Short Term Goals - 09/14/16 1152      PT SHORT TERM GOAL #1   Title The patient will perform HEP with assist from wife as needed for LE strengthening, standing balance and general mobility.  TARGET DATE FOR ALL STGs:  09/13/16   Baseline Patient has HEP that his wife is assisting with--PT to continue to progress.    Time 4   Period Weeks   Status Achieved     PT SHORT TERM GOAL #2   Title The patient will improve Berg score from 11/56 to > or equal to 20/56 to demo improving ability stand without UE support for ADLs.   Baseline Improved from 11/56 up to 17/56.    Time 4   Period Weeks   Status Partially Met     PT SHORT TERM GOAL #3   Title The patient will improve gait speed from 0.65 ft/sec to > or equal to 1.3 ft/sec to demo improved household ambulation (with least restrictive assistive device).   Baseline Improved from 0.65 ft/sec up to 0.89 ft/sec.   Time 4   Period Weeks   Status Partially Met     PT SHORT TERM GOAL #4   Title The patient will perform sit<>stand with UE support modified indep.   Baseline PT provides supervision consistently.     Time 4   Period Weeks   Status Partially Met     PT SHORT TERM GOAL #5   Title The patient will be furthe assessed on stairs and LTG to follow.   Baseline The patient requires min A for stairs x 4 steps.   Time 4   Period Weeks   Status Achieved     PT SHORT TERM GOAL #6   Title The patient will ambulate for 150 ft with least restrictive device modified indep.   Baseline Patient has improved to CGA for gait x 115 feet nonstop.   Time 8   Period Weeks   Status Partially Met           PT Long Term Goals - 10/16/16 0943      PT LONG TERM GOAL #1   Title The patient will perform HEP with assist from spouse.   Time 4   Period Weeks    Status New   Target Date 11/15/16     PT LONG TERM GOAL #2   Title The  patient will increase ambulation to move x 350 ft nonstop to demo improved household ambulation using rollater RW with supervision.   Time 4   Period Weeks   Status New   Target Date 11/15/16     PT LONG TERM GOAL #3   Title The patient will improve gait speed from 1.13 ft/sec up to 1.4 ft/sec to demo improving mobility with rollater RW.   Time 4   Period Weeks   Status New   Target Date 11/15/16     PT LONG TERM GOAL #4   Title The patient will negotiate obstacles to mimic household surfaces (narrow walking spaces, turning in tight spaces, etc) with rollater RW and supervision for improved safety for home.   Time 4   Period Weeks   Status New   Target Date 11/15/16     PT LONG TERM GOAL #5   Title Improve Berg from 19/56 up to 22/56 to demo improving standing balance for ADLs.   Time 4   Period Weeks   Status New   Target Date 11/15/16               Plan - 10/19/16 1630    Clinical Impression Statement PT emphasizing functional ambulation working on small space negotiation, turns, sitting with obstacles nearby with rollater RW and increasing distance.  Patient tolerated well and was able to walk from clinic through hallways, lobby and out to car with supervision with rollater.  PT to continue working towards updated HEP.    PT Treatment/Interventions ADLs/Self Care Home Management;Therapeutic activities;Therapeutic exercise;Neuromuscular re-education;Patient/family education;Functional mobility training;Gait training;DME Instruction;Stair training;Manual techniques;Orthotic Fit/Training;Wheelchair mobility training   PT Next Visit Plan Emphasize household ambulation and increasing distances/managing obstacles for home.  R knee stretching/strengthening, review prior HEP and progress as able.    Consulted and Agree with Plan of Care Patient;Family member/caregiver   Family Member Consulted spouse       Patient will benefit from skilled therapeutic intervention in order to improve the following deficits and impairments:  Abnormal gait, Decreased balance, Difficulty walking, Decreased strength, Decreased mobility, Impaired sensation, Postural dysfunction, Impaired flexibility, Pain  Visit Diagnosis: Other abnormalities of gait and mobility  Muscle weakness (generalized)  Unsteadiness on feet     Problem List Patient Active Problem List   Diagnosis Date Noted  . Seizures ([place]) 10/09/2016  . Healthcare maintenance 10/01/2016  . Restlessness and agitation 07/02/2016  . Vascular dementia with behavior disturbance 06/29/2016  . Hemiparesis and other late effects of cerebrovascular accident ([place]) 06/29/2016  . Embolic stroke ([place]) 92/03/69  . Right hemiparesis ([place])   . History of CVA with residual deficit   . Chronic anticoagulation   . Atrial fibrillation with rapid ventricular response ([place])   . Seizure prophylaxis   . Diastolic dysfunction   . Bacteremia due to Gram-positive bacteria   . Altered mental status   . Dementia without behavioral disturbance   . Leukocytosis   . Acute blood loss anemia   . Acute encephalopathy 06/04/2016  . PAF (paroxysmal atrial fibrillation) ([place])   . Iron deficiency anemia 04/04/2016  . History of CVA (cerebrovascular accident) 04/04/2016  . Cerebral hemorrhage (HCC) w/ SDH s/p IV tPA   . Coronary artery disease involving native coronary artery of native heart without angina pectoris   . Orthostatic hypotension   . History of right hip replacement   . Acute ischemic stroke ([place]) - L temporal lobe s/p tPA 03/30/2016  . BPH (benign prostatic hyperplasia) 11/25/2015  .  GERD (gastroesophageal reflux disease) 11/25/2015  . CKD (chronic kidney disease), stage II 11/25/2015  . Overactive bladder   . Cerebrovascular accident (CVA) ([place]) 09/18/2015  . Gait disturbance, post-stroke 06/23/2015  . 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  Thalamic infarction ([place]) 06/21/2015  . Benign essential HTN   . Type 2 diabetes mellitus with complication, without long-term current use of insulin ([place])   . Dysphagia as late effect of cerebrovascular disease   . Hyponatremia   . HLD (hyperlipidemia)   . COPD (chronic obstructive pulmonary disease) ([place]) 03/09/2011    Fergie Sherbert, PT 10/19/2016, 4:31 PM  [facility] [address] [place], [place], [address] Phone: [phone]   Fax:  [phone]  Name: TRESHAUN CARRICO MRN: [id number] Date of Birth: 10-02-1927

## 2016-10-19 NOTE — Patient Instructions (Signed)
Kitchen Sink Height    Stand at [employer] or sink and practice reaching right arm overhead to touch cabinet and then left arm.     Copyright  VHI. All rights reserved.

## 2016-10-23 ENCOUNTER — Encounter (HOSPITAL_COMMUNITY): Payer: Self-pay

## 2016-10-23 ENCOUNTER — Encounter (HOSPITAL_COMMUNITY)
Admission: EM | Disposition: A | Discharge status: Home / Self Care | Payer: Self-pay | Source: Home / Self Care | Attending: Emergency Medicine

## 2016-10-23 ENCOUNTER — Ambulatory Visit: Payer: Medicare Other | Admitting: Rehabilitative and Restorative Service Providers"

## 2016-10-23 ENCOUNTER — Ambulatory Visit (HOSPITAL_COMMUNITY)
Admission: EM | Admit: 2016-10-23 | Discharge: 2016-10-24 | Disposition: A | Discharge status: Home / Self Care | Payer: Medicare Other | Attending: Internal Medicine | Admitting: Internal Medicine

## 2016-10-23 VITALS — BP 160/98 | HR 115

## 2016-10-23 DIAGNOSIS — Z87891 Personal history of nicotine dependence: Secondary | ICD-10-CM | POA: Diagnosis not present

## 2016-10-23 DIAGNOSIS — R2681 Unsteadiness on feet: Secondary | ICD-10-CM

## 2016-10-23 DIAGNOSIS — K219 Gastro-esophageal reflux disease without esophagitis: Secondary | ICD-10-CM | POA: Insufficient documentation

## 2016-10-23 DIAGNOSIS — I129 Hypertensive chronic kidney disease with stage 1 through stage 4 chronic kidney disease, or unspecified chronic kidney disease: Secondary | ICD-10-CM | POA: Insufficient documentation

## 2016-10-23 DIAGNOSIS — I4891 Unspecified atrial fibrillation: Secondary | ICD-10-CM

## 2016-10-23 DIAGNOSIS — N400 Enlarged prostate without lower urinary tract symptoms: Secondary | ICD-10-CM | POA: Diagnosis not present

## 2016-10-23 DIAGNOSIS — R569 Unspecified convulsions: Secondary | ICD-10-CM | POA: Diagnosis not present

## 2016-10-23 DIAGNOSIS — Z959 Presence of cardiac and vascular implant and graft, unspecified: Secondary | ICD-10-CM

## 2016-10-23 DIAGNOSIS — R001 Bradycardia, unspecified: Secondary | ICD-10-CM

## 2016-10-23 DIAGNOSIS — J449 Chronic obstructive pulmonary disease, unspecified: Secondary | ICD-10-CM | POA: Insufficient documentation

## 2016-10-23 DIAGNOSIS — Z7982 Long term (current) use of aspirin: Secondary | ICD-10-CM | POA: Insufficient documentation

## 2016-10-23 DIAGNOSIS — I441 Atrioventricular block, second degree: Secondary | ICD-10-CM | POA: Diagnosis not present

## 2016-10-23 DIAGNOSIS — I69351 Hemiplegia and hemiparesis following cerebral infarction affecting right dominant side: Secondary | ICD-10-CM | POA: Diagnosis not present

## 2016-10-23 DIAGNOSIS — E1122 Type 2 diabetes mellitus with diabetic chronic kidney disease: Secondary | ICD-10-CM | POA: Insufficient documentation

## 2016-10-23 DIAGNOSIS — M1990 Unspecified osteoarthritis, unspecified site: Secondary | ICD-10-CM | POA: Insufficient documentation

## 2016-10-23 DIAGNOSIS — I480 Paroxysmal atrial fibrillation: Secondary | ICD-10-CM | POA: Insufficient documentation

## 2016-10-23 DIAGNOSIS — Z7901 Long term (current) use of anticoagulants: Secondary | ICD-10-CM | POA: Diagnosis not present

## 2016-10-23 DIAGNOSIS — I2510 Atherosclerotic heart disease of native coronary artery without angina pectoris: Secondary | ICD-10-CM | POA: Insufficient documentation

## 2016-10-23 DIAGNOSIS — I442 Atrioventricular block, complete: Secondary | ICD-10-CM | POA: Insufficient documentation

## 2016-10-23 DIAGNOSIS — R2689 Other abnormalities of gait and mobility: Secondary | ICD-10-CM

## 2016-10-23 DIAGNOSIS — I455 Other specified heart block: Secondary | ICD-10-CM | POA: Diagnosis not present

## 2016-10-23 DIAGNOSIS — N182 Chronic kidney disease, stage 2 (mild): Secondary | ICD-10-CM | POA: Insufficient documentation

## 2016-10-23 DIAGNOSIS — M6281 Muscle weakness (generalized): Secondary | ICD-10-CM | POA: Diagnosis not present

## 2016-10-23 DIAGNOSIS — F0151 Vascular dementia with behavioral disturbance: Secondary | ICD-10-CM | POA: Diagnosis not present

## 2016-10-23 DIAGNOSIS — I499 Cardiac arrhythmia, unspecified: Secondary | ICD-10-CM | POA: Diagnosis not present

## 2016-10-23 DIAGNOSIS — E785 Hyperlipidemia, unspecified: Secondary | ICD-10-CM | POA: Diagnosis not present

## 2016-10-23 HISTORY — PX: PACEMAKER IMPLANT: EP1218

## 2016-10-23 HISTORY — DX: Atrioventricular block, second degree: I44.1

## 2016-10-23 HISTORY — DX: Presence of cardiac pacemaker: Z95.0

## 2016-10-23 HISTORY — PX: INSERT / REPLACE / REMOVE PACEMAKER: SUR710

## 2016-10-23 LAB — COMPREHENSIVE METABOLIC PANEL
ALT: 17 U/L (ref 17–63)
AST: 32 U/L (ref 15–41)
Albumin: 3.4 g/dL — ABNORMAL LOW (ref 3.5–5.0)
Alkaline Phosphatase: 66 U/L (ref 38–126)
Anion gap: 12 (ref 5–15)
BUN: 15 mg/dL (ref 6–20)
CO2: 18 mmol/L — ABNORMAL LOW (ref 22–32)
Calcium: 9.1 mg/dL (ref 8.9–10.3)
Chloride: 107 mmol/L (ref 101–111)
Creatinine, Ser: 1.16 mg/dL (ref 0.61–1.24)
GFR calc Af Amer: >60 mL/min (ref >60)
GFR calc non Af Amer: 55 mL/min — ABNORMAL LOW (ref >60)
Glucose, Bld: 157 mg/dL — ABNORMAL HIGH (ref 65–99)
Potassium: 3.1 mmol/L — ABNORMAL LOW (ref 3.5–5.1)
Sodium: 137 mmol/L (ref 135–145)
Total Bilirubin: 0.7 mg/dL (ref 0.3–1.2)
Total Protein: 6.5 g/dL (ref 6.5–8.1)

## 2016-10-23 LAB — URINALYSIS, ROUTINE W REFLEX MICROSCOPIC
Bilirubin Urine: NEGATIVE
Glucose, UA: NEGATIVE mg/dL
Hgb urine dipstick: NEGATIVE
Ketones, ur: NEGATIVE mg/dL
Nitrite: NEGATIVE
Protein, ur: NEGATIVE mg/dL
Specific Gravity, Urine: 1.008 (ref 1.005–1.030)
pH: 8 (ref 5.0–8.0)

## 2016-10-23 LAB — CBC WITH DIFFERENTIAL/PLATELET
Basophils Absolute: 0 10*3/uL (ref 0.0–0.1)
Basophils Relative: 0 %
Eosinophils Absolute: 0.2 10*3/uL (ref 0.0–0.7)
Eosinophils Relative: 1 %
HCT: 37.2 % — ABNORMAL LOW (ref 39.0–52.0)
Hemoglobin: 12.3 g/dL — ABNORMAL LOW (ref 13.0–17.0)
Lymphocytes Relative: 19 %
Lymphs Abs: 2.4 10*3/uL (ref 0.7–4.0)
MCH: 30.4 pg (ref 26.0–34.0)
MCHC: 33.1 g/dL (ref 30.0–36.0)
MCV: 92.1 fL (ref 78.0–100.0)
Monocytes Absolute: 1.2 10*3/uL — ABNORMAL HIGH (ref 0.1–1.0)
Monocytes Relative: 10 %
Neutro Abs: 8.8 10*3/uL — ABNORMAL HIGH (ref 1.7–7.7)
Neutrophils Relative %: 70 %
Platelets: 360 10*3/uL (ref 150–400)
RBC: 4.04 MIL/uL — ABNORMAL LOW (ref 4.22–5.81)
RDW: 14.5 % (ref 11.5–15.5)
WBC: 12.7 10*3/uL — ABNORMAL HIGH (ref 4.0–10.5)

## 2016-10-23 LAB — TROPONIN I: Troponin I: <0.03 ng/mL (ref <0.03)

## 2016-10-23 SURGERY — PACEMAKER IMPLANT

## 2016-10-23 MED ORDER — ONDANSETRON HCL 4 MG/2ML IJ SOLN
4.0000 mg | Freq: Once | INTRAMUSCULAR | Status: AC
  Administered 2016-10-23: 4 mg via INTRAVENOUS

## 2016-10-23 MED ORDER — CEFAZOLIN SODIUM-DEXTROSE 1-4 GM/50ML-% IV SOLN
1.0000 g | Freq: Four times a day (QID) | INTRAVENOUS | Status: AC
  Administered 2016-10-23 – 2016-10-24 (×3): 1 g via INTRAVENOUS
  Filled 2016-10-23 (×3): qty 50

## 2016-10-23 MED ORDER — SODIUM CHLORIDE 0.9 % IV BOLUS (SEPSIS)
1000.0000 mL | Freq: Once | INTRAVENOUS | Status: AC
  Administered 2016-10-23: 1000 mL via INTRAVENOUS

## 2016-10-23 MED ORDER — HEPARIN (PORCINE) IN NACL 2-0.9 UNIT/ML-% IJ SOLN
INTRAMUSCULAR | Status: AC
  Filled 2016-10-23: qty 500

## 2016-10-23 MED ORDER — FENTANYL CITRATE (PF) 100 MCG/2ML IJ SOLN
INTRAMUSCULAR | Status: AC
  Filled 2016-10-23: qty 2

## 2016-10-23 MED ORDER — SODIUM CHLORIDE 0.9% FLUSH: 3.0000 mL | INTRAVENOUS | Status: DC | PRN

## 2016-10-23 MED ORDER — LEVETIRACETAM 500 MG PO TABS
500.0000 mg | ORAL_TABLET | Freq: Two times a day (BID) | ORAL | Status: DC
  Administered 2016-10-23 – 2016-10-24 (×2): 500 mg via ORAL
  Filled 2016-10-23 (×2): qty 1

## 2016-10-23 MED ORDER — SODIUM CHLORIDE 0.9 % IR SOLN
Status: AC
  Filled 2016-10-23: qty 2

## 2016-10-23 MED ORDER — PROMETHAZINE HCL 25 MG/ML IJ SOLN: 6.2500 mg | Freq: Once | INTRAMUSCULAR | Status: DC

## 2016-10-23 MED ORDER — ADULT MULTIVITAMIN W/MINERALS CH
1.0000 | ORAL_TABLET | Freq: Every day | ORAL | Status: DC
  Administered 2016-10-24: 09:00:00 1 via ORAL
  Filled 2016-10-23: qty 1

## 2016-10-23 MED ORDER — CHLORHEXIDINE GLUCONATE 4 % EX LIQD
60.0000 mL | Freq: Once | CUTANEOUS | Status: DC
  Filled 2016-10-23: qty 60

## 2016-10-23 MED ORDER — POLYVINYL ALCOHOL 1.4 % OP SOLN
1.0000 [drp] | Freq: Every day | OPHTHALMIC | Status: DC | PRN
  Filled 2016-10-23: qty 15

## 2016-10-23 MED ORDER — IOPAMIDOL (ISOVUE-370) INJECTION 76%
INTRAVENOUS | Status: AC
  Filled 2016-10-23: qty 50

## 2016-10-23 MED ORDER — HEPARIN (PORCINE) IN NACL 2-0.9 UNIT/ML-% IJ SOLN
INTRAMUSCULAR | Status: AC | PRN
  Administered 2016-10-23: 500 mL

## 2016-10-23 MED ORDER — QUETIAPINE FUMARATE 25 MG PO TABS
25.0000 mg | ORAL_TABLET | Freq: Every day | ORAL | Status: DC
  Administered 2016-10-23: 25 mg via ORAL
  Filled 2016-10-23: qty 1

## 2016-10-23 MED ORDER — YOU HAVE A PACEMAKER BOOK
Freq: Once | Status: AC
  Administered 2016-10-23: 19:00:00
  Filled 2016-10-23: qty 1

## 2016-10-23 MED ORDER — LIDOCAINE HCL (PF) 1 % IJ SOLN
INTRAMUSCULAR | Status: DC | PRN
  Administered 2016-10-23: 45 mL

## 2016-10-23 MED ORDER — SODIUM CHLORIDE 0.9 % IV SOLN: 250.0000 mL | INTRAVENOUS | Status: DC | PRN

## 2016-10-23 MED ORDER — FENTANYL CITRATE (PF) 100 MCG/2ML IJ SOLN
INTRAMUSCULAR | Status: DC | PRN
  Administered 2016-10-23 (×2): 12.5 ug via INTRAVENOUS

## 2016-10-23 MED ORDER — SODIUM CHLORIDE 0.9% FLUSH
3.0000 mL | Freq: Two times a day (BID) | INTRAVENOUS | Status: DC
  Administered 2016-10-24: 09:00:00 3 mL via INTRAVENOUS

## 2016-10-23 MED ORDER — PROMETHAZINE HCL 25 MG/ML IJ SOLN
6.2500 mg | Freq: Once | INTRAMUSCULAR | Status: AC
Start: 2016-10-23 — End: 2016-10-23
  Administered 2016-10-23: 6.25 mg via INTRAVENOUS
  Filled 2016-10-23: qty 1

## 2016-10-23 MED ORDER — ATORVASTATIN CALCIUM 80 MG PO TABS
80.0000 mg | ORAL_TABLET | Freq: Every day | ORAL | Status: DC
  Administered 2016-10-23: 80 mg via ORAL
  Filled 2016-10-23: qty 1

## 2016-10-23 MED ORDER — CEFAZOLIN SODIUM-DEXTROSE 2-4 GM/100ML-% IV SOLN
INTRAVENOUS | Status: AC
  Filled 2016-10-23: qty 100

## 2016-10-23 MED ORDER — NITROGLYCERIN 0.4 MG SL SUBL: 0.4000 mg | SUBLINGUAL_TABLET | SUBLINGUAL | Status: DC | PRN

## 2016-10-23 MED ORDER — IOPAMIDOL (ISOVUE-370) INJECTION 76%
INTRAVENOUS | Status: DC | PRN
  Administered 2016-10-23: 15 mL via INTRAVENOUS

## 2016-10-23 MED ORDER — LIDOCAINE HCL (PF) 1 % IJ SOLN
INTRAMUSCULAR | Status: AC
  Filled 2016-10-23: qty 60

## 2016-10-23 MED ORDER — SODIUM CHLORIDE 0.9 % IR SOLN
80.0000 mg | Status: AC
  Administered 2016-10-23: 80 mg
  Filled 2016-10-23: qty 2

## 2016-10-23 MED ORDER — PANTOPRAZOLE SODIUM 40 MG PO TBEC
40.0000 mg | DELAYED_RELEASE_TABLET | Freq: Every day | ORAL | Status: DC
  Administered 2016-10-24: 40 mg via ORAL
  Filled 2016-10-23: qty 1

## 2016-10-23 MED ORDER — ONDANSETRON HCL 4 MG/2ML IJ SOLN: 4.0000 mg | Freq: Four times a day (QID) | INTRAMUSCULAR | Status: DC | PRN

## 2016-10-23 MED ORDER — ONDANSETRON HCL 4 MG/2ML IJ SOLN
INTRAMUSCULAR | Status: AC
  Filled 2016-10-23: qty 2

## 2016-10-23 MED ORDER — CEFAZOLIN SODIUM-DEXTROSE 2-4 GM/100ML-% IV SOLN
2.0000 g | INTRAVENOUS | Status: AC
  Administered 2016-10-23: 2 g via INTRAVENOUS

## 2016-10-23 MED ORDER — ACETAMINOPHEN 325 MG PO TABS: 325.0000 mg | ORAL_TABLET | ORAL | Status: DC | PRN

## 2016-10-23 MED ORDER — HYDROCODONE-ACETAMINOPHEN 5-325 MG PO TABS: 1.0000 | ORAL_TABLET | ORAL | Status: DC | PRN

## 2016-10-23 MED ORDER — SODIUM CHLORIDE 0.9 % IV SOLN: INTRAVENOUS | Status: DC

## 2016-10-23 SURGICAL SUPPLY — 8 items
CABLE SURGICAL S-101-97-12 (CABLE) ×3 IMPLANT
KIT MICROINTRODUCER STIFF 5F (SHEATH) ×3 IMPLANT
LEAD TENDRIL MRI 52CM LPA1200M (Lead) ×3 IMPLANT
LEAD TENDRIL MRI 58CM LPA1200M (Lead) ×3 IMPLANT
PACEMAKER ASSURITY DR-RF (Pacemaker) ×3 IMPLANT
PAD DEFIB LIFELINK (PAD) ×3 IMPLANT
SHEATH CLASSIC 8F (SHEATH) ×6 IMPLANT
TRAY PACEMAKER INSERTION (PACKS) ×3 IMPLANT

## 2016-10-23 NOTE — Progress Notes (Addendum)
Condom cath placed on patient

## 2016-10-23 NOTE — ED Notes (Signed)
Cardiology at the bedside.

## 2016-10-23 NOTE — ED Triage Notes (Signed)
Per [facility], Pt is coming from [facility] where he was completing rehab for his knee. Pt was noted to get nauseous and diaphoretic. Pt was noted to be in Afib with bradycardia. EMS placed patient to pace, but he converted back to NSR. Vitals per EMS: CBG 145, 130/76, 30s Afib,

## 2016-10-23 NOTE — ED Notes (Signed)
Family at the bedside.

## 2016-10-23 NOTE — H&P (Signed)
ELECTROPHYSIOLOGY HISTORY AND PHYSICAL    Patient ID: Herbert Marquez MRN: [id number], DOB/AGE: [id number] 81 y.o.  Admit date: 10/23/2016 Date of Consult: 10/23/2016  Primary Physician: [name], NP Primary Cardiologist: Nahser Electrophysiologist: Koden Hunzeker (new this admission)  Patient Profile: Herbert Marquez is a 83 y.o. male with a history of CAD s/p CABG, recurrent strokes, PAF (on Eliquis) who is being seen today for the evaluation of symptomatic bradycardia at the request of ER MD.  HPI:  Herbert Marquez is a 83 y.o. male who has been a longstanding patient of Dr Acie Fredrickson.   He has had recurrent strokes and was participating in outpatient rehab this morning. He was walking around the track with his walker when he became acutely tired and felt that he didn't have any energy.  He then became nauseated and diaphoretic and EMS was called.  He was found to be bradycardic with rates in the 30's.  EMS was preparing to transcutaneously pace when his conduction improved to rates in the 70's.  In the ER, he has been found to have SR with intermittent complete heart block, junctional escape beats.  He currently is feeling improved with rates in the 60's after Zofran was given.  He has had some episodic dizziness in the mornings the last couple of days as well which has resolved spontaneously.  He is on no AVN blocking agents at home.   Echo 06/2016 demonstrated normal LVEF.   He denies chest pain, palpitations, dyspnea, PND, orthopnea, syncope, edema, weight gain, or early satiety.  Past Medical History:  Diagnosis Date  . Arthritis    "pretty much all over"   . Basal cell carcinoma    "several burned off his body, face, head"  . BPH (benign prostatic hypertrophy)   . Coronary artery disease    a. s/p PCI of RCA in 2006  . CVA (cerebral infarction)    a. 06/2015: left thalamic and bilateral PCA  . GERD (gastroesophageal reflux disease)   . Hyperlipidemia   . Hypertension   .  Stroke ([place])   . TIA (transient ischemic attack)    Approximately 6 weeks post-cardiac catheterization.      Surgical History:  Past Surgical History:  Procedure Laterality Date  . CARDIOVASCULAR STRESS TEST  07/01/2007   EF 74%  . CATARACT EXTRACTION, BILATERAL    . CORONARY ANGIOPLASTY WITH STENT PLACEMENT  10/2004   stenting x 2 to RCA  . FEMUR IM NAIL Right 11/26/2015   Procedure: INTRAMEDULLARY RIGHT (IM) NAIL FEMORAL;  Surgeon: Rod Can, MD;  Location: [facility];  Service: Orthopedics;  Laterality: Right;  . HERNIA REPAIR    . HIP ARTHROPLASTY  03/09/2011   Procedure: ARTHROPLASTY BIPOLAR HIP;  Surgeon: Mauri Pole;  Location: [facility];  Service: Orthopedics;  Laterality: Left;  . LAPAROSCOPIC INCISIONAL / UMBILICAL / VENTRAL HERNIA REPAIR     "below his naval"     Allergies: No Known Allergies  Social History   Social History  . Marital status: Married    Spouse name: N/A  . Number of children: N/A  . Years of education: N/A   Occupational History  . Retired Other   Social History Main Topics  . Smoking status: Former Smoker    Quit date: 03/06/1971  . Smokeless tobacco: Never Used  . Alcohol use 4.8 oz/week    1 Cans of beer, 7 Glasses of wine per week     Comment: 1/2 BEER AND 1 WINE  .  Drug use: No  . Sexual activity: No   Other Topics Concern  . Not on file   Social History Narrative   Lives in [place], [place] with wife. Has 3 children.      Family History  Problem Relation Age of Onset  . Hypertension Mother   . Lung cancer Father   . Lung cancer Brother      Review of Systems: All other systems reviewed and are otherwise negative except as noted above.  Physical Exam: Vitals:   10/23/16 1230 10/23/16 1245 10/23/16 1300 10/23/16 1315  BP: (!) 145/85 (!) 156/78 (!) 162/87 (!) 168/107  Pulse: (!) 55 69 65 67  Resp: (!) 31 (!) 24 15 20   SpO2: 98% 100% 100% 100%  Weight:      Height:        GEN- The patient is elderly and chronically  ill appearing, alert and oriented x 3 today.   HEENT: normocephalic, atraumatic; sclera clear, conjunctiva pink; hearing intact; oropharynx clear; neck supple Lungs- Clear to ausculation bilaterally, normal work of breathing.  No wheezes, rales, rhonchi Heart- Regular rate and rhythm  GI- soft, non-tender, non-distended, bowel sounds present Extremities- no clubbing, cyanosis, or edema  MS- no significant deformity or atrophy Skin- warm and dry, no rash or lesion Psych- euthymic mood, full affect Neuro- strength and sensation are intact  Labs:  Lab Results  Component Value Date   WBC 12.7 (H) 10/23/2016   HGB 12.3 (L) 10/23/2016   HCT 37.2 (L) 10/23/2016   MCV 92.1 10/23/2016   PLT 360 10/23/2016    Recent Labs Lab 10/23/16 1213  NA 137  K 3.1*  CL 107  CO2 18*  BUN 15  CREATININE 1.16  CALCIUM 9.1  PROT 6.5  BILITOT 0.7  ALKPHOS 66  ALT 17  AST 32  GLUCOSE 157*      Radiology/Studies: No results found.  WUG:QBVQX rhythm, 1st degree AV block; second EKG likely complete heart block with junctional escape beats (personally reviewed)  TELEMETRY: sinus rhythm, periods of complete heart block, junctional escape beats  (personally reviewed)   Assessment/Plan: 1.  Symptomatic intermittent complete heart block The patient has symptomatic intermittent complete heart block. He is on no AVN blocking agents. There are no reversible causes identified. Risks, benefits to pacemaker implantation reviewed with the patient and his wife/daughter who wish to proceed.   2.  Paroxysmal atrial fibrillation Appropriately anticoagulated with Eliquis for CHADS2VASC of at least 6 He took last dose this morning, discussed increase risk of bleeding  3.  Recurrent strokes Residual right sided weakness Because of recurrent strokes on [place], Dr Erlinda Hong added low dose ASA at last office visit  4.  CAD No recent ischemic symptoms Continue current therapy  Dr Rayann Heman to see later today     Signed, Chanetta Marshall 10/23/2016 1:45 PM  I have seen, examined the patient, and reviewed the above assessment and plan.  On exam, chronically ill, elderly, and frail.  RRR.  Changes to above are made where necessary.  The patient has symptomatic mobitz II second degree AV block.  Prior ekgs reveal LAHB.  No reversible causes for symptomatic bradycardia are found.  I would therefore recommend pacemaker implantation at this time.  Risks, benefits, alternatives to pacemaker implantation were discussed in detail with the patient and his wife/ daughter today. They understand that the risks include but are not limited to bleeding, infection, pneumothorax, perforation, tamponade, vascular damage, renal failure, MI, stroke, death,  and  lead dislodgement and wish to proceed. We will therefore schedule the procedure at the next available time.  Bleeding risks are increased with eliquis and ASA.  Will hold eliquis overnight post PPM and resume when able.   Co Sign: Thompson Grayer, MD 10/23/2016 3:46 PM

## 2016-10-23 NOTE — ED Notes (Signed)
MD updated about patient's nausea

## 2016-10-23 NOTE — Interval H&P Note (Signed)
History and Physical Interval Note:  10/23/2016 3:49 PM  Herbert Marquez  has presented today for surgery, with the diagnosis of heart block  The various methods of treatment have been discussed with the patient and family. After consideration of risks, benefits and other options for treatment, the patient has consented to  Procedure(s): Pacemaker Implant (N/A) as a surgical intervention .  The patient's history has been reviewed, patient examined, no change in status, stable for surgery.  I have reviewed the patient's chart and labs.  Questions were answered to the patient's satisfaction.     Thompson Grayer

## 2016-10-23 NOTE — ED Provider Notes (Signed)
[facility] DEPT Provider Note   CSN: [id number] Arrival date & time: 10/23/16  1155     History   Chief Complaint Chief Complaint  Patient presents with  . Bradycardia    HPI AAYDEN CEFALU is a 83 y.o. male.  Pt presents to the ED today with bradycardia and n/v.  Pt went to his rehab appointment at [facility].  He became nauseous and threw up.  They noted that his hr was in the 66s.  The pt's hr went back up to 20s.  EMS brought him here and his hr dropped again twice en route.  Pt feels nauseous and dizzy now.  He denies any pain.  He does have a.fib and is on Eliquis, but I don't see any meds that would lower hr in his med list.  CHA2DS2/VAS Stroke Risk Points      7 >= 2 Points: High Risk  1 - 1.99 Points: Medium Risk  0 Points: Low Risk    This is the only CHA2DS2/VAS Stroke Risk Points available for the past  year.:  Change: N/A         Details    Note: External data might be a factor in metrics not marked with    Points Metrics   This score determines the patient's risk of having a stroke if the  patient has atrial fibrillation.       0 Has Congestive Heart Failure:  No   1 Has Vascular Disease:  Yes   1 Has Hypertension:  Yes   2 Age:  75   1 Has Diabetes:  Yes   2 Had Stroke:  Yes Had TIA:  Yes Had thromboembolism:  No   0 Male:  No             Past Medical History:  Diagnosis Date  . Arthritis    "pretty much all over"   . Basal cell carcinoma    "several burned off his body, face, head"  . BPH (benign prostatic hypertrophy)   . Coronary artery disease    a. s/p PCI of RCA in 2006  . CVA (cerebral infarction)    a. 06/2015: left thalamic and bilateral PCA  . GERD (gastroesophageal reflux disease)   . Hyperlipidemia   . Hypertension   . Stroke ([place])   . TIA (transient ischemic attack)    Approximately 6 weeks post-cardiac catheterization.     Patient Active Problem List   Diagnosis Date Noted  . Seizures ([place]) 10/09/2016    . Healthcare maintenance 10/01/2016  . Restlessness and agitation 07/02/2016  . Vascular dementia with behavior disturbance 06/29/2016  . Hemiparesis and other late effects of cerebrovascular accident ([place]) 06/29/2016  . Embolic stroke ([place]) 54/27/0623  . Right hemiparesis ([place])   . History of CVA with residual deficit   . Chronic anticoagulation   . Atrial fibrillation with rapid ventricular response ([place])   . Seizure prophylaxis   . Diastolic dysfunction   . Bacteremia due to Gram-positive bacteria   . Altered mental status   . Dementia without behavioral disturbance   . Leukocytosis   . Acute blood loss anemia   . Acute encephalopathy 06/04/2016  . PAF (paroxysmal atrial fibrillation) ([place])   . Iron deficiency anemia 04/04/2016  . History of CVA (cerebrovascular accident) 04/04/2016  . Cerebral hemorrhage (HCC) w/ SDH s/p IV tPA   . Coronary artery disease involving native coronary artery of native heart without angina pectoris   . Orthostatic  hypotension   . History of right hip replacement   . Acute ischemic stroke ([place]) - L temporal lobe s/p tPA 03/30/2016  . BPH (benign prostatic hyperplasia) 11/25/2015  . GERD (gastroesophageal reflux disease) 11/25/2015  . CKD (chronic kidney disease), stage II 11/25/2015  . Overactive bladder   . Cerebrovascular accident (CVA) ([place]) 09/18/2015  . Gait disturbance, post-stroke 06/23/2015  . 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 Thalamic infarction ([place]) 06/21/2015  . Benign essential HTN   . Type 2 diabetes mellitus with complication, without long-term current use of insulin ([place])   . Dysphagia as late effect of cerebrovascular disease   . Hyponatremia   . HLD (hyperlipidemia)   . COPD (chronic obstructive pulmonary disease) ([place]) 03/09/2011    Past Surgical History:  Procedure Laterality Date  . CARDIOVASCULAR STRESS TEST  07/01/2007   EF 74%  . CATARACT EXTRACTION, BILATERAL    . CORONARY ANGIOPLASTY WITH STENT PLACEMENT   10/2004   stenting x 2 to RCA  . FEMUR IM NAIL Right 11/26/2015   Procedure: INTRAMEDULLARY RIGHT (IM) NAIL FEMORAL;  Surgeon: Rod Can, MD;  Location: [facility];  Service: Orthopedics;  Laterality: Right;  . HERNIA REPAIR    . HIP ARTHROPLASTY  03/09/2011   Procedure: ARTHROPLASTY BIPOLAR HIP;  Surgeon: Mauri Pole;  Location: [facility];  Service: Orthopedics;  Laterality: Left;  . LAPAROSCOPIC INCISIONAL / UMBILICAL / VENTRAL HERNIA REPAIR     "below his naval"       Home Medications    Prior to Admission medications   Medication Sig Start Date End Date Taking? Authorizing Provider  apixaban (ELIQUIS) 5 MG TABS tablet Take 1 tablet (5 mg total) by mouth 2 (two) times daily. 04/13/16  Yes Love, Ivan Anchors, PA-C  aspirin EC 81 MG tablet Take 1 tablet (81 mg total) by mouth daily. 10/09/16  Yes Rosalin Hawking, MD  atorvastatin (LIPITOR) 80 MG tablet TAKE 1 TABLET BY MOUTH DAILY. 08/20/16  Yes Danford, Valetta Fuller D, NP  levETIRAcetam (KEPPRA) 500 MG tablet Take 1 tablet (500 mg total) by mouth 2 (two) times daily. 08/20/16  Yes Dennie Bible, NP  Multiple Vitamin (MULTIVITAMIN WITH MINERALS) TABS tablet Take 1 tablet by mouth daily.   Yes [provider]  nitroGLYCERIN (NITROSTAT) 0.4 MG SL tablet Place 1 tablet (0.4 mg total) under the tongue every 5 (five) minutes as needed for chest pain. 07/23/16  Yes Nahser, Wonda Cheng, MD  omeprazole (PRILOSEC) 20 MG capsule Take 20 mg by mouth daily.   Yes [provider]  polyvinyl alcohol (ARTIFICIAL TEARS) 1.4 % ophthalmic solution Place 1 drop into both eyes daily as needed for dry eyes.   Yes [provider]  QUEtiapine (SEROQUEL) 25 MG tablet TAKE 1 TABLET BY MOUTH AT BEDTIME. 10/17/16  Yes Kirsteins, Luanna Salk, MD    Family History Family History  Problem Relation Age of Onset  . Hypertension Mother   . Lung cancer Father   . Lung cancer Brother     Social History Social History  Substance Use Topics  . Smoking status:  Former Smoker    Quit date: 03/06/1971  . Smokeless tobacco: Never Used  . Alcohol use 4.8 oz/week    1 Cans of beer, 7 Glasses of wine per week     Comment: 1/2 BEER AND 1 WINE     Allergies   Patient has no known allergies.   Review of Systems Review of Systems  Constitutional: Positive for  diaphoresis.  Gastrointestinal: Positive for nausea.  All other systems reviewed and are negative.    Physical Exam Updated Vital Signs BP (!) 168/107   Pulse 67   Resp 20   Ht 5\' 7"  (1.702 m)   Wt 73.9 kg (163 lb)   SpO2 100%   BMI 25.53 kg/m   Physical Exam  Constitutional: He is oriented to person, place, and time. He appears well-developed. He appears distressed.  HENT:  Head: Normocephalic and atraumatic.  Right Ear: External ear normal.  Left Ear: External ear normal.  Nose: Nose normal.  Mouth/Throat: Oropharynx is clear and moist.  Eyes: Pupils are equal, round, and reactive to light. Conjunctivae and EOM are normal.  Neck: Normal range of motion. Neck supple.  Cardiovascular: Normal rate, regular rhythm, normal heart sounds and intact distal pulses.   Pulmonary/Chest: Effort normal and breath sounds normal.  Abdominal: Soft. Bowel sounds are normal.  Musculoskeletal: Normal range of motion.  Neurological: He is alert and oriented to person, place, and time.  Skin: Skin is warm.  Psychiatric: He has a normal mood and affect. His behavior is normal. Thought content normal.  Nursing note and vitals reviewed.    ED Treatments / Results  Labs (all labs ordered are listed, but only abnormal results are displayed) Labs Reviewed  CBC WITH DIFFERENTIAL/PLATELET - Abnormal; Notable for the following:       Result Value   WBC 12.7 (*)    RBC 4.04 (*)    Hemoglobin 12.3 (*)    HCT 37.2 (*)    Neutro Abs 8.8 (*)    Monocytes Absolute 1.2 (*)    All other components within normal limits  COMPREHENSIVE METABOLIC PANEL - Abnormal; Notable for the following:    Potassium  3.1 (*)    CO2 18 (*)    Glucose, Bld 157 (*)    Albumin 3.4 (*)    GFR calc non Af Amer 55 (*)    All other components within normal limits  TROPONIN I  URINALYSIS, ROUTINE W REFLEX MICROSCOPIC    EKG  EKG Interpretation  Date/Time:  Tuesday October 23 2016 13:11:47 EDT Ventricular Rate:  48 PR Interval:    QRS Duration: 131 QT Interval:  533 QTC Calculation: 477 R Axis:   -47 Text Interpretation:  Nonspecific IVCD with LAD Left ventricular hypertrophy Lateral infarct, age indeterminate Confirmed by Isla Pence [phone]) on 10/23/2016 1:18:34 PM       Radiology No results found.  Procedures Procedures (including critical care time)  Medications Ordered in ED Medications  ondansetron (ZOFRAN) injection 4 mg (4 mg Intravenous Given 10/23/16 1209)  sodium chloride 0.9 % bolus 1,000 mL (0 mLs Intravenous Stopped 10/23/16 1326)  promethazine (PHENERGAN) injection 6.25 mg (6.25 mg Intravenous Given 10/23/16 1322)     Initial Impression / Assessment and Plan / ED Course  I have reviewed the triage vital signs and the nursing notes.  Pertinent labs & imaging results that were available during my care of the patient were reviewed by me and considered in my medical decision making (see chart for details).    While here, pt's HR dropped again into the 30s.  It self corrected back to the 70s without intervention.  Pt d/w cardiology b/c of the likely need for a pacemaker.  They did see him and will take him for pacemaker placement.  Final Clinical Impressions(s) / ED Diagnoses   Final diagnoses:  Symptomatic bradycardia  Intermittent complete heart block ([place])  New Prescriptions New Prescriptions   No medications on file     Isla Pence, MD 10/23/16 [phone]

## 2016-10-23 NOTE — ED Notes (Signed)
Pt being transported upstairs by Lennette Bihari, RN to [employer]

## 2016-10-23 NOTE — ED Notes (Signed)
Pt incontinent of urine; peri care done, brief and linens changed

## 2016-10-23 NOTE — Therapy (Signed)
[facility] [address] [place], [place], [address] Phone: [phone]   Fax:  [phone]  Physical Therapy Treatment  Patient Details  Name: Herbert Marquez MRN: [id number] Date of Birth: [birth year] Referring Provider: Alysia Penna, MD  Encounter Date: 10/23/2016      PT End of Session - 10/23/16 1414    Visit Number 14   Number of Visits 20   Date for PT Re-Evaluation 11/15/16   Authorization Type G code every 10th visit   PT Start Time 1025  treated 40 minutes before medical deterioration   PT Stop Time 1120   PT Time Calculation (min) 55 min   Equipment Utilized During Treatment Gait belt   Activity Tolerance Treatment limited secondary to medical complications (Comment)   Behavior During Therapy Anxious;WFL for tasks assessed/performed      Past Medical History:  Diagnosis Date  . Arthritis    "pretty much all over"   . Basal cell carcinoma    "several burned off his body, face, head"  . BPH (benign prostatic hypertrophy)   . Coronary artery disease    a. s/p PCI of RCA in 2006  . CVA (cerebral infarction)    a. 06/2015: left thalamic and bilateral PCA  . GERD (gastroesophageal reflux disease)   . Hyperlipidemia   . Hypertension   . Stroke ([place])   . TIA (transient ischemic attack)    Approximately 6 weeks post-cardiac catheterization.     Past Surgical History:  Procedure Laterality Date  . CARDIOVASCULAR STRESS TEST  07/01/2007   EF 74%  . CATARACT EXTRACTION, BILATERAL    . CORONARY ANGIOPLASTY WITH STENT PLACEMENT  10/2004   stenting x 2 to RCA  . FEMUR IM NAIL Right 11/26/2015   Procedure: INTRAMEDULLARY RIGHT (IM) NAIL FEMORAL;  Surgeon: Rod Can, MD;  Location: [facility];  Service: Orthopedics;  Laterality: Right;  . HERNIA REPAIR    . HIP ARTHROPLASTY  03/09/2011   Procedure: ARTHROPLASTY BIPOLAR HIP;  Surgeon: Mauri Pole;  Location: [facility];  Service: Orthopedics;  Laterality:  Left;  . LAPAROSCOPIC INCISIONAL / UMBILICAL / VENTRAL HERNIA REPAIR     "below his naval"    Vitals:   10/23/16 1031  BP: (!) 160/98  Pulse: (!) 115        Subjective Assessment - 10/23/16 1031    Subjective The patient walked into clinic today with rollater RW supervised by wife.   Pertinent History L hip replace, R hip fracture, h/o multiple CVAs, vascular dementia.   Patient Stated Goals Trying to get back to walking.   Currently in Pain? Yes   Pain Score --  none at rest, some with walking                         [facility] Adult PT Treatment/Exercise - 10/23/16 1048      Transfers   Transfers Sit to Stand   Sit to Stand 6: Modified independent (Device/Increase time)   Sit to Stand Details (indicate cue type and reason) Performed x 5 reps emphasize static standing balance with dec'ing UE support on device.     Ambulation/Gait   Ambulation/Gait Yes   Ambulation/Gait Assistance 5: Supervision   Ambulation/Gait Assistance Details Verbal cues for longer stride length and heel strike right leg.  Encouraging R knee extension moving initial strike to mid stance.     Ambulation Distance (Feet) 115 Feet  x 3 reps, 230 ft  requiring increased time due to fatigue   Assistive device Rollator     Neuro Re-ed    Neuro Re-ed Details  STanding right/anterior weight shifting emphasizing hip control and knee extension.  Standing right weight shift working on putting R heel down to increase weight shift.  Stood in stride position emphasizing R knee extension and awareness of position.       Exercises   Other Exercises  Seated right LE extension moving from 45 degree flexion working toward extension x 10 reps with tactile cues; standing heel cord stretch holding with L UE and PT assisting to push R heel into ground.  Sit<>stand emphasizing Right quad contraction dec'ing UE support.   Seated marching wide/narrow R LE. Seated hamstring stretch.                PT  Education - 10/23/16 1413    Education provided Yes   Education Details Recommended that we call EMS due to sudden onset of n/v, fatigue, diaphoresis   Person(s) Educated Patient;Spouse   Methods Explanation   Comprehension Verbalized understanding          PT Short Term Goals - 09/14/16 1152      PT SHORT TERM GOAL #1   Title The patient will perform HEP with assist from wife as needed for LE strengthening, standing balance and general mobility.  TARGET DATE FOR ALL STGs:  09/13/16   Baseline Patient has HEP that his wife is assisting with--PT to continue to progress.    Time 4   Period Weeks   Status Achieved     PT SHORT TERM GOAL #2   Title The patient will improve Berg score from 11/56 to > or equal to 20/56 to demo improving ability stand without UE support for ADLs.   Baseline Improved from 11/56 up to 17/56.    Time 4   Period Weeks   Status Partially Met     PT SHORT TERM GOAL #3   Title The patient will improve gait speed from 0.65 ft/sec to > or equal to 1.3 ft/sec to demo improved household ambulation (with least restrictive assistive device).   Baseline Improved from 0.65 ft/sec up to 0.89 ft/sec.   Time 4   Period Weeks   Status Partially Met     PT SHORT TERM GOAL #4   Title The patient will perform sit<>stand with UE support modified indep.   Baseline PT provides supervision consistently.     Time 4   Period Weeks   Status Partially Met     PT SHORT TERM GOAL #5   Title The patient will be furthe assessed on stairs and LTG to follow.   Baseline The patient requires min A for stairs x 4 steps.   Time 4   Period Weeks   Status Achieved     PT SHORT TERM GOAL #6   Title The patient will ambulate for 150 ft with least restrictive device modified indep.   Baseline Patient has improved to CGA for gait x 115 feet nonstop.   Time 8   Period Weeks   Status Partially Met           PT Long Term Goals - 10/16/16 0943      PT LONG TERM GOAL #1   Title  The patient will perform HEP with assist from spouse.   Time 4   Period Weeks   Status New   Target Date 11/15/16     PT LONG  TERM GOAL #2   Title The patient will increase ambulation to move x 350 ft nonstop to demo improved household ambulation using rollater RW with supervision.   Time 4   Period Weeks   Status New   Target Date 11/15/16     PT LONG TERM GOAL #3   Title The patient will improve gait speed from 1.13 ft/sec up to 1.4 ft/sec to demo improving mobility with rollater RW.   Time 4   Period Weeks   Status New   Target Date 11/15/16     PT LONG TERM GOAL #4   Title The patient will negotiate obstacles to mimic household surfaces (narrow walking spaces, turning in tight spaces, etc) with rollater RW and supervision for improved safety for home.   Time 4   Period Weeks   Status New   Target Date 11/15/16     PT LONG TERM GOAL #5   Title Improve Berg from 19/56 up to 22/56 to demo improving standing balance for ADLs.   Time 4   Period Weeks   Status New   Target Date 11/15/16               Plan - 10/23/16 1421    Clinical Impression Statement Nearing the end of today's session, the patient had a sudden onset of n/v, diaphoresis and reported not feeling well.  PT recommended we call EMS and the patient was transported to [facility].    PT Treatment/Interventions ADLs/Self Care Home Management;Therapeutic activities;Therapeutic exercise;Neuromuscular re-education;Patient/family education;Functional mobility training;Gait training;DME Instruction;Stair training;Manual techniques;Orthotic Fit/Training;Wheelchair mobility training   PT Next Visit Plan Check on medical status prior to continuing.  Emphasize household ambulation, R knee stretching, review prior HEP and progress to tolerance.   Consulted and Agree with Plan of Care Patient;Family member/caregiver   Family Member Consulted spouse      Patient will benefit from skilled therapeutic intervention in  order to improve the following deficits and impairments:  Abnormal gait, Decreased balance, Difficulty walking, Decreased strength, Decreased mobility, Impaired sensation, Postural dysfunction, Impaired flexibility, Pain  Visit Diagnosis: Other abnormalities of gait and mobility  Muscle weakness (generalized)  Unsteadiness on feet     Problem List Patient Active Problem List   Diagnosis Date Noted  . Seizures ([place]) 10/09/2016  . Healthcare maintenance 10/01/2016  . Restlessness and agitation 07/02/2016  . Vascular dementia with behavior disturbance 06/29/2016  . Hemiparesis and other late effects of cerebrovascular accident ([place]) 06/29/2016  . Embolic stroke ([place]) 16/12/9602  . Right hemiparesis ([place])   . History of CVA with residual deficit   . Chronic anticoagulation   . Atrial fibrillation with rapid ventricular response ([place])   . Seizure prophylaxis   . Diastolic dysfunction   . Bacteremia due to Gram-positive bacteria   . Altered mental status   . Dementia without behavioral disturbance   . Leukocytosis   . Acute blood loss anemia   . Acute encephalopathy 06/04/2016  . PAF (paroxysmal atrial fibrillation) ([place])   . Iron deficiency anemia 04/04/2016  . History of CVA (cerebrovascular accident) 04/04/2016  . Cerebral hemorrhage ([facility]) w/ SDH s/p IV tPA   . Coronary artery disease involving native coronary artery of native heart without angina pectoris   . Orthostatic hypotension   . History of right hip replacement   . Acute ischemic stroke ([place]) - L temporal lobe s/p tPA 03/30/2016  . BPH (benign prostatic hyperplasia) 11/25/2015  . GERD (gastroesophageal reflux disease) 11/25/2015  . CKD (chronic  kidney disease), stage II 11/25/2015  . Overactive bladder   . Cerebrovascular accident (CVA) ([place]) 09/18/2015  . Gait disturbance, post-stroke 06/23/2015  . 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 Thalamic infarction ([place]) 06/21/2015  . Benign essential HTN   . Type 2  diabetes mellitus with complication, without long-term current use of insulin ([place])   . Dysphagia as late effect of cerebrovascular disease   . Hyponatremia   . HLD (hyperlipidemia)   . COPD (chronic obstructive pulmonary disease) ([place]) 03/09/2011    Cheynne Virden, PT 10/23/2016, 2:23 PM  [facility] [address] [place], [place], [address] Phone: [phone]   Fax:  [phone]  Name: Herbert Marquez MRN: [id number] Date of Birth: 01/29/1928

## 2016-10-24 ENCOUNTER — Telehealth: Payer: Self-pay | Admitting: Adult Health

## 2016-10-24 ENCOUNTER — Ambulatory Visit (HOSPITAL_COMMUNITY): Payer: Medicare Other

## 2016-10-24 ENCOUNTER — Encounter (HOSPITAL_COMMUNITY): Payer: Self-pay | Admitting: Internal Medicine

## 2016-10-24 ENCOUNTER — Telehealth: Payer: Self-pay | Admitting: Physical Therapy

## 2016-10-24 DIAGNOSIS — E1122 Type 2 diabetes mellitus with diabetic chronic kidney disease: Secondary | ICD-10-CM | POA: Diagnosis not present

## 2016-10-24 DIAGNOSIS — K219 Gastro-esophageal reflux disease without esophagitis: Secondary | ICD-10-CM | POA: Diagnosis not present

## 2016-10-24 DIAGNOSIS — N400 Enlarged prostate without lower urinary tract symptoms: Secondary | ICD-10-CM | POA: Diagnosis not present

## 2016-10-24 DIAGNOSIS — I441 Atrioventricular block, second degree: Secondary | ICD-10-CM | POA: Diagnosis not present

## 2016-10-24 DIAGNOSIS — I129 Hypertensive chronic kidney disease with stage 1 through stage 4 chronic kidney disease, or unspecified chronic kidney disease: Secondary | ICD-10-CM | POA: Diagnosis not present

## 2016-10-24 DIAGNOSIS — J449 Chronic obstructive pulmonary disease, unspecified: Secondary | ICD-10-CM | POA: Diagnosis not present

## 2016-10-24 DIAGNOSIS — I69351 Hemiplegia and hemiparesis following cerebral infarction affecting right dominant side: Secondary | ICD-10-CM | POA: Diagnosis not present

## 2016-10-24 DIAGNOSIS — M1990 Unspecified osteoarthritis, unspecified site: Secondary | ICD-10-CM | POA: Diagnosis not present

## 2016-10-24 DIAGNOSIS — N182 Chronic kidney disease, stage 2 (mild): Secondary | ICD-10-CM | POA: Diagnosis not present

## 2016-10-24 DIAGNOSIS — I2510 Atherosclerotic heart disease of native coronary artery without angina pectoris: Secondary | ICD-10-CM | POA: Diagnosis not present

## 2016-10-24 DIAGNOSIS — I480 Paroxysmal atrial fibrillation: Secondary | ICD-10-CM | POA: Diagnosis not present

## 2016-10-24 DIAGNOSIS — Z950 Presence of cardiac pacemaker: Secondary | ICD-10-CM | POA: Diagnosis not present

## 2016-10-24 DIAGNOSIS — I442 Atrioventricular block, complete: Secondary | ICD-10-CM | POA: Diagnosis not present

## 2016-10-24 DIAGNOSIS — E785 Hyperlipidemia, unspecified: Secondary | ICD-10-CM | POA: Diagnosis not present

## 2016-10-24 LAB — BASIC METABOLIC PANEL
Anion gap: 6 (ref 5–15)
BUN: 10 mg/dL (ref 6–20)
CO2: 24 mmol/L (ref 22–32)
Calcium: 8.6 mg/dL — ABNORMAL LOW (ref 8.9–10.3)
Chloride: 108 mmol/L (ref 101–111)
Creatinine, Ser: 1.01 mg/dL (ref 0.61–1.24)
GFR calc Af Amer: >60 mL/min (ref >60)
GFR calc non Af Amer: >60 mL/min (ref >60)
Glucose, Bld: 95 mg/dL (ref 65–99)
Potassium: 3.5 mmol/L (ref 3.5–5.1)
Sodium: 138 mmol/L (ref 135–145)

## 2016-10-24 MED ORDER — APIXABAN 5 MG PO TABS: 5.0000 mg | ORAL_TABLET | Freq: Two times a day (BID) | ORAL | 1 refills | Status: DC

## 2016-10-24 MED ORDER — CEPHALEXIN 500 MG PO CAPS: 500.0000 mg | ORAL_CAPSULE | Freq: Two times a day (BID) | ORAL | 0 refills | Status: AC

## 2016-10-24 NOTE — Progress Notes (Signed)
Final assessment prior to patient discharge. No change in assessment. Remains stable with no swelling, bleeding or pain

## 2016-10-24 NOTE — Discharge Summary (Signed)
ELECTROPHYSIOLOGY PROCEDURE DISCHARGE SUMMARY    Patient ID: Herbert Marquez,  MRN: [id number], DOB/AGE: 05/02/1927 83 y.o.  Admit date: 10/23/2016 Discharge date: 10/24/2016  Primary Care Physician: [name], NP Primary Cardiologist: [place] Electrophysiologist: Edvin Albus  Primary Discharge Diagnosis:  Symptomatic intermittent complete heart block status post pacemaker implantation this admission  Secondary Discharge Diagnosis:  1.  CAD 2.  Recurrent strokes 3.  Paroxysmal atrial fibrillation 4.  HTN 5.  Hyperlipidemia  No Known Allergies   Procedures This Admission:  1.  Implantation of a STJ dual chamber PPM on 8/21/1/ by Dr Rayann Heman. There were no immediate post procedure complications. 2.  CXR on 10/24/16 demonstrated no pneumothorax status post device implantation.   Brief HPI/Hospital Course:  Herbert Marquez is a 83 y.o. male who has been a longstanding patient of Dr Acie Fredrickson.   He has had recurrent strokes and was participating in outpatient rehab this morning. He was walking around the track with his walker when he became acutely tired and felt that he didn't have any energy.  He then became nauseated and diaphoretic and EMS was called.  He was found to be bradycardic with rates in the 30's.  EMS was preparing to transcutaneously pace when his conduction improved to rates in the 70's.  In the ER, he has been found to have SR with intermittent complete heart block, junctional escape beats.  He currently is feeling improved with rates in the 60's after Zofran was given.  He has had some episodic dizziness in the mornings the last couple of days as well which has resolved spontaneously.  He is on no AVN blocking agents at home.The patient has had symptomatic bradycardia without reversible causes identified.  Risks, benefits, and alternatives to PPM implantation were reviewed with the patient who wished to proceed. The patient underwent implantation of a STJ dual chamber  pacemaker with details as outlined above.  He  was monitored on telemetry overnight which demonstrated SR with intermittent A and V pacing.  Left chest was without hematoma or ecchymosis.  The device was interrogated and found to be functioning normally.  CXR was obtained and demonstrated no pneumothorax status post device implantation.  Wound care, arm mobility, and restrictions were reviewed with the patient.  The patient was examined and considered stable for discharge to home.   Urinalysis was consistent with UTI - the patient was given a course of Keflex for 7 days at discharge (discussed with pharmacy).    Physical Exam: Vitals:   10/23/16 1815 10/23/16 2000 10/24/16 0507 10/24/16 0730  BP: (!) 149/90 125/66 122/65 120/71  Pulse: (!) 58 74 64 63  Resp: (!) 22 (!) 23 15 14   Temp:  98 F (36.7 C) 98.3 F (36.8 C) 98.1 F (36.7 C)  TempSrc:  Oral Oral Oral  SpO2: 100% 99% 96% 95%  Weight:   162 lb 0.6 oz (73.5 kg)   Height:        GEN- The patient is elderly and chronically ill appearing, alert and oriented x 3 today.   HEENT: normocephalic, atraumatic; sclera clear, conjunctiva pink; hearing intact; oropharynx clear; neck supple  Lungs- Clear to ausculation bilaterally, normal work of breathing.  No wheezes, rales, rhonchi Heart- Regular rate and rhythm (paced) GI- soft, non-tender, non-distended, bowel sounds present  Extremities- no clubbing, cyanosis, or edema  MS- no significant deformity or atrophy Skin- warm and dry, no rash or lesion, left chest without hematoma/ecchymosis Psych- euthymic mood, full affect  Neuro- strength and sensation are intact   Labs:   Lab Results  Component Value Date   WBC 12.7 (H) 10/23/2016   HGB 12.3 (L) 10/23/2016   HCT 37.2 (L) 10/23/2016   MCV 92.1 10/23/2016   PLT 360 10/23/2016     Recent Labs Lab 10/23/16 1213 10/24/16 0412  NA 137 138  K 3.1* 3.5  CL 107 108  CO2 18* 24  BUN 15 10  CREATININE 1.16 1.01  CALCIUM 9.1 8.6*   PROT 6.5  --   BILITOT 0.7  --   ALKPHOS 66  --   ALT 17  --   AST 32  --   GLUCOSE 157* 95    Discharge Medications:  Allergies as of 10/24/2016   No Known Allergies     Medication List    TAKE these medications   apixaban 5 MG Tabs tablet Commonly known as:  ELIQUIS Take 1 tablet (5 mg total) by mouth 2 (two) times daily. Resume 10/25/16 evening dose What changed:  additional instructions   ARTIFICIAL TEARS 1.4 % ophthalmic solution Generic drug:  polyvinyl alcohol Place 1 drop into both eyes daily as needed for dry eyes.   aspirin EC 81 MG tablet Take 1 tablet (81 mg total) by mouth daily.   atorvastatin 80 MG tablet Commonly known as:  LIPITOR TAKE 1 TABLET BY MOUTH DAILY.   cephALEXin 500 MG capsule Commonly known as:  KEFLEX Take 1 capsule (500 mg total) by mouth 2 (two) times daily.   levETIRAcetam 500 MG tablet Commonly known as:  KEPPRA Take 1 tablet (500 mg total) by mouth 2 (two) times daily.   multivitamin with minerals Tabs tablet Take 1 tablet by mouth daily.   nitroGLYCERIN 0.4 MG SL tablet Commonly known as:  NITROSTAT Place 1 tablet (0.4 mg total) under the tongue every 5 (five) minutes as needed for chest pain.   omeprazole 20 MG capsule Commonly known as:  PRILOSEC Take 20 mg by mouth daily.   QUEtiapine 25 MG tablet Commonly known as:  SEROQUEL TAKE 1 TABLET BY MOUTH AT BEDTIME.            Discharge Care Instructions        Start     Ordered   10/24/16 0000  apixaban (ELIQUIS) 5 MG TABS tablet  2 times daily    Question:  Supervising Provider  Answer:  Evans Lance   10/24/16 0847   10/24/16 0000  Increase activity slowly     10/24/16 0847   10/24/16 0000  Diet - low sodium heart healthy     10/24/16 0847   10/24/16 0000  cephALEXin (KEFLEX) 500 MG capsule  2 times daily    Question:  Supervising Provider  Answer:  Evans Lance   10/24/16 0847      Disposition:  Discharge Instructions    Diet - low sodium heart  healthy    Complete by:  As directed    Increase activity slowly    Complete by:  As directed      Follow-up Information    [place] Office Follow up on 11/01/2016.   Specialty:  Cardiology Why:  at [facility] for wound check  Contact information: [address] [place] [phone]       Thompson Grayer, MD Follow up on 01/23/2017.   Specialty:  Cardiology Why:  at 9:15AM Contact information: [place]  [id number] [phone]           Duration of Discharge Encounter: Greater than 30 minutes including physician time.  Signed, Chanetta Marshall, NP 10/24/2016 8:47 AM   Thompson Grayer MD, [facility] 10/24/2016 6:36 PM

## 2016-10-24 NOTE — Progress Notes (Signed)
Device interrogation is reviewed and normal cxr reveals stable leads, no ptx Will treat UTI Routine wound care and follow-up  Thompson Grayer MD, [facility] 10/24/2016 8:12 AM

## 2016-10-24 NOTE — Care Management Note (Signed)
Case Management Note  Patient Details  Name: GRANTLEY SAVAGE MRN: [id number] Date of Birth: [birth year]  Subjective/Objective:   From home with wife, s/p pace maker, already been taking eliquis at home,  pta ,uses rolling walker and w/chair at home.  He has a PCP, Faith Rogue NP, Has Medicare Part D for medication coverage, has had [facility] in the past, but currently going to [facility] outpatient for outpatient physical therapy which he will resume.                   Action/Plan: NCM will follow for dc needs.   Expected Discharge Date:  10/24/16               Expected Discharge Plan:  Home/Self Care  In-House Referral:     Discharge planning Services  CM Consult  Post Acute Care Choice:    Choice offered to:     DME Arranged:    DME Agency:     HH Arranged:    HH Agency:     Status of Service:  Completed, signed off  If discussed at [employer] of Stay Meetings, dates discussed:    Additional Comments:  Zenon Mayo, RN 10/24/2016, 9:38 AM

## 2016-10-24 NOTE — Telephone Encounter (Signed)
OK to resume physical therapy in 1 week. Should be able to participate fully.

## 2016-10-24 NOTE — Discharge Instructions (Signed)
° ° °  Supplemental Discharge Instructions for  Pacemaker/Defibrillator Patients  Activity No heavy lifting or vigorous activity with your left/right arm for 6 to 8 weeks.  Do not raise your left/right arm above your head for one week.  Gradually raise your affected arm as drawn below.           __         10/28/16                    10/29/16                       10/30/16                    10/31/16   WOUND CARE - Keep the wound area clean and dry.  Do not get this area wet for one week. No showers for one week; you may shower on    10/31/16 . - The tape/steri-strips on your wound will fall off; do not pull them off.  No bandage is needed on the site.  DO  NOT apply any creams, oils, or ointments to the wound area. - If you notice any drainage or discharge from the wound, any swelling or bruising at the site, or you develop a fever > 101? F after you are discharged home, call the office at once.  Special Instructions - You are still able to use cellular telephones; use the ear opposite the side where you have your pacemaker/defibrillator.  Avoid carrying your cellular phone near your device. - When traveling through airports, show security personnel your identification card to avoid being screened in the metal detectors.  Ask the security personnel to use the hand wand. - Avoid arc welding equipment, TENS units (transcutaneous nerve stimulators).  Call the office for questions about other devices. - Avoid electrical appliances that are in poor condition or are not properly grounded. - Microwave ovens are safe to be near or to operate.

## 2016-10-24 NOTE — Care Management Important Message (Signed)
Important Message  Patient Details  Name: Herbert Marquez MRN: [id number] Date of Birth: [birth year]   Medicare Important Message Given:  Yes    Bena Kobel 10/24/2016, 11:22 AM

## 2016-10-24 NOTE — Telephone Encounter (Signed)
Patient's wife wants to speak to someone clinical about an antibiotic

## 2016-10-24 NOTE — Telephone Encounter (Signed)
Dr. Rayann Heman,   Noted [name] DOB 16-Oct-1927 was admitted to the hospital and underwent pacemaker placement by you on 10/23/16.  He has been undergoing Physical Therapy since mid-June for gait disorder and weakness. He has called our outpatient Neuro[facility]habilitation center re: his future appointments. He wants to return to our clinic for his appointment 10/29/16.  Can you please clarify when patient may return to OPPT and any restrictions he should follow related to use of his left arm?  Thank you for your assistance,   Barry Brunner, PT Outpatient Neurorehabilitation [address], [place], [place] [address] [phone]

## 2016-10-25 ENCOUNTER — Ambulatory Visit: Payer: Medicare Other | Admitting: Physical Therapy

## 2016-10-25 NOTE — Telephone Encounter (Signed)
Pt's wife states that the hospital put pt on cephalexin for a UTI.  She wanted to make sure that this is not the same medication he was given previously for a UTI that did not work.  Advised spouse that it is not the same medication.  Spouse expressed understanding.  Charyl Bigger, CMA

## 2016-10-26 ENCOUNTER — Telehealth: Payer: Self-pay | Admitting: Internal Medicine

## 2016-10-26 NOTE — Telephone Encounter (Signed)
New message    Pt wife would like to know if her husband can resume physical therapy on Monday, he had surgery Tuesday.

## 2016-10-26 NOTE — Telephone Encounter (Signed)
I advised Mrs. Reinhold that Mr. Cochrane could resume PT with the restrictions to his left arm d/t left sided PPM implant 10/23/16.  She also asked about going to the beach and r/s wound check. She is not sure if they will go. I advised that they could r/s to 9/4/ if needed. She verbalizes understanding and will call back if needed.

## 2016-10-29 ENCOUNTER — Ambulatory Visit: Payer: Medicare Other | Admitting: Rehabilitative and Restorative Service Providers"

## 2016-10-31 ENCOUNTER — Ambulatory Visit: Payer: Medicare Other | Admitting: Rehabilitative and Restorative Service Providers"

## 2016-11-01 ENCOUNTER — Ambulatory Visit (INDEPENDENT_AMBULATORY_CARE_PROVIDER_SITE_OTHER): Payer: Medicare Other | Admitting: *Deleted

## 2016-11-01 DIAGNOSIS — I441 Atrioventricular block, second degree: Secondary | ICD-10-CM | POA: Diagnosis not present

## 2016-11-01 DIAGNOSIS — I480 Paroxysmal atrial fibrillation: Secondary | ICD-10-CM | POA: Diagnosis not present

## 2016-11-01 LAB — CUP PACEART INCLINIC DEVICE CHECK
Battery Remaining Longevity: 141 mo
Battery Voltage: 3.1 V
Brady Statistic RA Percent Paced: 13 %
Brady Statistic RV Percent Paced: 2.6 %
Date Time Interrogation Session: 20180830154209
Implantable Lead Implant Date: 20180821
Implantable Lead Implant Date: 20180821
Implantable Lead Location: 753859
Implantable Lead Location: 753860
Implantable Pulse Generator Implant Date: 20180821
Lead Channel Impedance Value: 475 Ohm
Lead Channel Impedance Value: 612.5 Ohm
Lead Channel Pacing Threshold Amplitude: 0.75 V
Lead Channel Pacing Threshold Amplitude: 0.75 V
Lead Channel Pacing Threshold Amplitude: 0.75 V
Lead Channel Pacing Threshold Amplitude: 0.75 V
Lead Channel Pacing Threshold Pulse Width: 0.5 ms
Lead Channel Pacing Threshold Pulse Width: 0.5 ms
Lead Channel Pacing Threshold Pulse Width: 0.5 ms
Lead Channel Pacing Threshold Pulse Width: 0.5 ms
Lead Channel Sensing Intrinsic Amplitude: 12 mV
Lead Channel Sensing Intrinsic Amplitude: 4.1 mV
Lead Channel Setting Pacing Amplitude: 0.875
Lead Channel Setting Pacing Amplitude: 1.75 V
Lead Channel Setting Pacing Pulse Width: 0.5 ms
Lead Channel Setting Sensing Sensitivity: 2 mV
Pulse Gen Model: 2272
Pulse Gen Serial Number: 8933379

## 2016-11-01 NOTE — Progress Notes (Signed)
Wound check appointment. Steri-strips removed. Wound without redness or edema. Incision edges approximated, wound well healed. Normal device function. Thresholds, sensing, and impedances consistent with implant measurements. Auto capture programmed on. Histogram distribution appropriate for patient and level of activity. 2 mode switches- longest 3:32, 1:1 SVT @ 165bpm. No high ventricular rates noted. Patient educated about wound care, arm mobility, lifting restrictions and Merlin monitoring. ROV with Dr. Rayann Heman 01/23/17.

## 2016-11-02 ENCOUNTER — Telehealth: Payer: Self-pay | Admitting: Internal Medicine

## 2016-11-02 NOTE — Telephone Encounter (Signed)
New message    Pt wife is calling to let nurse know the name of antibiotic he is taking. It is called cephalexin 500 mg. Wife states that yesterday at appt it wasn't noted in the system.

## 2016-11-07 ENCOUNTER — Ambulatory Visit: Payer: Medicare Other | Admitting: Rehabilitative and Restorative Service Providers"

## 2016-11-09 ENCOUNTER — Ambulatory Visit
Payer: Medicare Other | Attending: Physical Medicine & Rehabilitation | Admitting: Rehabilitative and Restorative Service Providers"

## 2016-11-09 DIAGNOSIS — M6281 Muscle weakness (generalized): Secondary | ICD-10-CM | POA: Diagnosis not present

## 2016-11-09 DIAGNOSIS — R2681 Unsteadiness on feet: Secondary | ICD-10-CM | POA: Diagnosis not present

## 2016-11-09 DIAGNOSIS — R2689 Other abnormalities of gait and mobility: Secondary | ICD-10-CM | POA: Diagnosis not present

## 2016-11-09 DIAGNOSIS — H6122 Impacted cerumen, left ear: Secondary | ICD-10-CM | POA: Diagnosis not present

## 2016-11-09 NOTE — Therapy (Signed)
[facility] [address] [place], [place], [address] Phone: [phone]   Fax:  [phone]  Physical Therapy Treatment  Patient Details  Name: Herbert Marquez MRN: [id number] Date of Birth: 1927/09/08 Referring Provider: Alysia Penna, MD  Encounter Date: 11/09/2016      PT End of Session - 11/09/16 2100    Visit Number 15   Number of Visits 20   Date for PT Re-Evaluation 11/15/16   Authorization Type G code every 10th visit   PT Start Time 1108   PT Stop Time 1148   PT Time Calculation (min) 40 min   Equipment Utilized During Treatment Gait belt   Activity Tolerance Patient tolerated treatment well   Behavior During Therapy [facility] for tasks assessed/performed      Past Medical History:  Diagnosis Date  . Arthritis    "pretty much all over"   . Basal cell carcinoma    "several burned off his body, face, head"  . BPH (benign prostatic hypertrophy)   . Coronary artery disease    a. s/p PCI of RCA in 2006  . CVA (cerebral infarction)    a. 06/2015: left thalamic and bilateral PCA  . GERD (gastroesophageal reflux disease)   . Hyperlipidemia   . Hypertension   . Presence of permanent cardiac pacemaker   . Stroke ([place])   . TIA (transient ischemic attack)    Approximately 6 weeks post-cardiac catheterization.     Past Surgical History:  Procedure Laterality Date  . CARDIOVASCULAR STRESS TEST  07/01/2007   EF 74%  . CATARACT EXTRACTION, BILATERAL    . CORONARY ANGIOPLASTY WITH STENT PLACEMENT  10/2004   stenting x 2 to RCA  . FEMUR IM NAIL Right 11/26/2015   Procedure: INTRAMEDULLARY RIGHT (IM) NAIL FEMORAL;  Surgeon: Rod Can, MD;  Location: [facility];  Service: Orthopedics;  Laterality: Right;  . FRACTURE SURGERY    . HERNIA REPAIR    . HIP ARTHROPLASTY  03/09/2011   Procedure: ARTHROPLASTY BIPOLAR HIP;  Surgeon: Mauri Pole;  Location: [facility];  Service: Orthopedics;  Laterality: Left;  . INSERT /  REPLACE / REMOVE PACEMAKER  10/23/2016  . LAPAROSCOPIC INCISIONAL / UMBILICAL / VENTRAL HERNIA REPAIR     "below his naval"  . PACEMAKER IMPLANT N/A 10/23/2016   Procedure: Pacemaker Implant;  Surgeon: Thompson Grayer, MD;  Location: [facility];  Service: Cardiovascular;  Laterality: N/A;    There were no vitals filed for this visit.      Subjective Assessment - 11/09/16 1101    Subjective The patient notes that he has returned to walking at home with walker.  He walked in today with rollater, supervised by his wife.   Patient had pacemaker placed 10/23/2016.   Pertinent History L hip replace, R hip fracture, h/o multiple CVAs, vascular dementia.   Patient Stated Goals Trying to get back to walking.   Currently in Pain? Yes   Pain Score 0-No pain  with sitting   Pain Location Knee   Pain Orientation Right   Pain Descriptors / Indicators Aching   Pain Type Chronic pain   Pain Onset More than a month ago   Pain Frequency Intermittent   Aggravating Factors  PT to monitor response to treatment   Pain Relieving Factors sitting                         [facility] Adult PT Treatment/Exercise - 11/09/16  2103      Ambulation/Gait   Ambulation/Gait Yes   Ambulation/Gait Assistance 5: Supervision   Ambulation/Gait Assistance Details R knee brace with cues on posture and R heel strike at initial stance phase   Ambulation Distance (Feet) 130 Feet  130, 75, 115   Assistive device Rollator   Ambulation Surface Level;Indoor;Paved;Outdoor     Self-Care   Self-Care Other Self-Care Comments   Other Self-Care Comments  Discussed limitations for community mobility as patient still needs some assistance from his wife to manage clothing during toileting tasks.    PT recommends rollater use with supervision, therefore patient's wife would need to be nearby for all mobility.  Discussed barriers and recommended continuing to lengthen home walking.     Neuro Re-ed    Neuro Re-ed  Details  Performed standing anterior/posterior weight shifting, reaching (to 90 degrees due to recent pacemaker placed).       Exercises   Exercises Other Exercises   Other Exercises  Reviewed sitting exercises for home including LAQ, hamstring stretch, ankle pumps (had been given previously in home health), supine exercises of quad set, SLR with cues, bridges adding hip adduction with bridge x 10 reps.                    PT Short Term Goals - 09/14/16 1152      PT SHORT TERM GOAL #1   Title The patient will perform HEP with assist from wife as needed for LE strengthening, standing balance and general mobility.  TARGET DATE FOR ALL STGs:  09/13/16   Baseline Patient has HEP that his wife is assisting with--PT to continue to progress.    Time 4   Period Weeks   Status Achieved     PT SHORT TERM GOAL #2   Title The patient will improve Berg score from 11/56 to > or equal to 20/56 to demo improving ability stand without UE support for ADLs.   Baseline Improved from 11/56 up to 17/56.    Time 4   Period Weeks   Status Partially Met     PT SHORT TERM GOAL #3   Title The patient will improve gait speed from 0.65 ft/sec to > or equal to 1.3 ft/sec to demo improved household ambulation (with least restrictive assistive device).   Baseline Improved from 0.65 ft/sec up to 0.89 ft/sec.   Time 4   Period Weeks   Status Partially Met     PT SHORT TERM GOAL #4   Title The patient will perform sit<>stand with UE support modified indep.   Baseline PT provides supervision consistently.     Time 4   Period Weeks   Status Partially Met     PT SHORT TERM GOAL #5   Title The patient will be furthe assessed on stairs and LTG to follow.   Baseline The patient requires min A for stairs x 4 steps.   Time 4   Period Weeks   Status Achieved     PT SHORT TERM GOAL #6   Title The patient will ambulate for 150 ft with least restrictive device modified indep.   Baseline Patient has improved  to CGA for gait x 115 feet nonstop.   Time 8   Period Weeks   Status Partially Met           PT Long Term Goals - 10/16/16 0943      PT LONG TERM GOAL #1   Title The patient will  perform HEP with assist from spouse.   Time 4   Period Weeks   Status New   Target Date 11/15/16     PT LONG TERM GOAL #2   Title The patient will increase ambulation to move x 350 ft nonstop to demo improved household ambulation using rollater RW with supervision.   Time 4   Period Weeks   Status New   Target Date 11/15/16     PT LONG TERM GOAL #3   Title The patient will improve gait speed from 1.13 ft/sec up to 1.4 ft/sec to demo improving mobility with rollater RW.   Time 4   Period Weeks   Status New   Target Date 11/15/16     PT LONG TERM GOAL #4   Title The patient will negotiate obstacles to mimic household surfaces (narrow walking spaces, turning in tight spaces, etc) with rollater RW and supervision for improved safety for home.   Time 4   Period Weeks   Status New   Target Date 11/15/16     PT LONG TERM GOAL #5   Title Improve Berg from 19/56 up to 22/56 to demo improving standing balance for ADLs.   Time 4   Period Weeks   Status New   Target Date 11/15/16               Plan - 11/09/16 2102    Clinical Impression Statement The patient returns today after not being seen x 2 weeks due to hospitalization and pacemaker placement.  PT to check goals next week and determine if on schedule to d/c or if patient would benefit from further therapy due to recent change in status.    Clinical Impairments Affecting Rehab Potential Barriers to rehab include multiple CVAs, diminished sensation.  Enhancers to rehab potential include family support, patient motivation.   PT Treatment/Interventions ADLs/Self Care Home Management;Therapeutic activities;Therapeutic exercise;Neuromuscular re-education;Patient/family education;Functional mobility training;Gait training;DME Instruction;Stair  training;Manual techniques;Orthotic Fit/Training;Wheelchair mobility training   PT Next Visit Plan CHECK LTGs and make plan, R knee stretching, review prior HEP and progress to tolerance.   Consulted and Agree with Plan of Care Patient;Family member/caregiver   Family Member Consulted spouse      Patient will benefit from skilled therapeutic intervention in order to improve the following deficits and impairments:  Abnormal gait, Decreased balance, Difficulty walking, Decreased strength, Decreased mobility, Impaired sensation, Postural dysfunction, Impaired flexibility, Pain  Visit Diagnosis: Other abnormalities of gait and mobility  Muscle weakness (generalized)  Unsteadiness on feet     Problem List Patient Active Problem List   Diagnosis Date Noted  . Second degree Mobitz II AV block 10/23/2016  . Seizures ([place]) 10/09/2016  . Healthcare maintenance 10/01/2016  . Restlessness and agitation 07/02/2016  . Vascular dementia with behavior disturbance 06/29/2016  . Hemiparesis and other late effects of cerebrovascular accident ([place]) 06/29/2016  . Embolic stroke ([place]) 82/64/1583  . Right hemiparesis ([place])   . History of CVA with residual deficit   . Chronic anticoagulation   . Atrial fibrillation with rapid ventricular response ([place])   . Seizure prophylaxis   . Diastolic dysfunction   . Bacteremia due to Gram-positive bacteria   . Altered mental status   . Dementia without behavioral disturbance   . Leukocytosis   . Acute blood loss anemia   . Acute encephalopathy 06/04/2016  . PAF (paroxysmal atrial fibrillation) ([place])   . Iron deficiency anemia 04/04/2016  . History of CVA (cerebrovascular accident) 04/04/2016  . Cerebral  hemorrhage ([place]) w/ SDH s/p IV tPA   . Coronary artery disease involving native coronary artery of native heart without angina pectoris   . Orthostatic hypotension   . History of right hip replacement   . Acute ischemic stroke ([place]) - L temporal lobe s/p  tPA 03/30/2016  . BPH (benign prostatic hyperplasia) 11/25/2015  . GERD (gastroesophageal reflux disease) 11/25/2015  . CKD (chronic kidney disease), stage II 11/25/2015  . Overactive bladder   . Cerebrovascular accident (CVA) ([place]) 09/18/2015  . Gait disturbance, post-stroke 06/23/2015  . 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 Thalamic infarction ([place]) 06/21/2015  . Benign essential HTN   . Type 2 diabetes mellitus with complication, without long-term current use of insulin ([place])   . Dysphagia as late effect of cerebrovascular disease   . Hyponatremia   . HLD (hyperlipidemia)   . COPD (chronic obstructive pulmonary disease) ([place]) 03/09/2011    Kiley Torrence, PT 11/09/2016, 9:09 PM  [facility] [address] [place], [place], [address] Phone: [phone]   Fax:  [phone]  Name: Herbert Marquez MRN: [id number] Date of Birth: February 13, 1928

## 2016-11-12 ENCOUNTER — Ambulatory Visit: Payer: Medicare Other | Admitting: Rehabilitative and Restorative Service Providers"

## 2016-11-12 DIAGNOSIS — R2689 Other abnormalities of gait and mobility: Secondary | ICD-10-CM | POA: Diagnosis not present

## 2016-11-12 DIAGNOSIS — R2681 Unsteadiness on feet: Secondary | ICD-10-CM | POA: Diagnosis not present

## 2016-11-12 DIAGNOSIS — M6281 Muscle weakness (generalized): Secondary | ICD-10-CM

## 2016-11-12 NOTE — Therapy (Signed)
[facility] [address] [place], [place], [address] Phone: [phone]   Fax:  [phone]  Physical Therapy Treatment  Patient Details  Name: Herbert Marquez MRN: [id number] Date of Birth: 12-Feb-1928 Referring Provider: Alysia Penna, MD  Encounter Date: 11/12/2016      PT End of Session - 11/12/16 1035    Visit Number 16   Number of Visits 20   Date for PT Re-Evaluation 11/15/16   Authorization Type G code every 10th visit   PT Start Time 1020   PT Stop Time 1100   PT Time Calculation (min) 40 min   Equipment Utilized During Treatment Gait belt   Activity Tolerance Patient tolerated treatment well   Behavior During Therapy [facility] for tasks assessed/performed      Past Medical History:  Diagnosis Date  . Arthritis    "pretty much all over"   . Basal cell carcinoma    "several burned off his body, face, head"  . BPH (benign prostatic hypertrophy)   . Coronary artery disease    a. s/p PCI of RCA in 2006  . CVA (cerebral infarction)    a. 06/2015: left thalamic and bilateral PCA  . GERD (gastroesophageal reflux disease)   . Hyperlipidemia   . Hypertension   . Presence of permanent cardiac pacemaker   . Stroke ([place])   . TIA (transient ischemic attack)    Approximately 6 weeks post-cardiac catheterization.     Past Surgical History:  Procedure Laterality Date  . CARDIOVASCULAR STRESS TEST  07/01/2007   EF 74%  . CATARACT EXTRACTION, BILATERAL    . CORONARY ANGIOPLASTY WITH STENT PLACEMENT  10/2004   stenting x 2 to RCA  . FEMUR IM NAIL Right 11/26/2015   Procedure: INTRAMEDULLARY RIGHT (IM) NAIL FEMORAL;  Surgeon: Rod Can, MD;  [facility]: [facility];  Service: Orthopedics;  Laterality: Right;  . FRACTURE SURGERY    . HERNIA REPAIR    . HIP ARTHROPLASTY  03/09/2011   Procedure: ARTHROPLASTY BIPOLAR HIP;  Surgeon: Mauri Pole;  [facility];  Service: Orthopedics;  Laterality: Left;  . INSERT /  REPLACE / REMOVE PACEMAKER  10/23/2016  . LAPAROSCOPIC INCISIONAL / UMBILICAL / VENTRAL HERNIA REPAIR     "below his naval"  . PACEMAKER IMPLANT N/A 10/23/2016   Procedure: Pacemaker Implant;  Surgeon: Thompson Grayer, MD;  Location: [facility];  Service: Cardiovascular;  Laterality: N/A;    There were no vitals filed for this visit.      Subjective Assessment - 11/12/16 1025    Subjective The patient notes he is walking more in the home.  His wife notes her car keys got locked in her car this morning in our lot with car running-=-patient attended session on his own today.   Pertinent History L hip replace, R hip fracture, h/o multiple CVAs, vascular dementia.   Patient Stated Goals Trying to get back to walking.   Currently in Pain? No/denies  feels nervous due to car key situation            [facility] PT Assessment - 11/12/16 1039      Standardized Balance Assessment   Standardized Balance Assessment Berg Balance Test     Berg Balance Test   Sit to Stand Able to stand  independently using hands   Standing Unsupported Able to stand 2 minutes with supervision   Sitting with Back Unsupported but Feet Supported on Floor or Stool Able to sit safely  and securely 2 minutes   Stand to Sit Controls descent by using hands   Transfers Able to transfer with verbal cueing and /or supervision   Standing Unsupported with Eyes Closed Able to stand 10 seconds with supervision   Standing Ubsupported with Feet Together Able to place feet together independently but unable to hold for 30 seconds   From Standing, Reach Forward with Outstretched Arm Reaches forward but needs supervision   From Standing Position, Pick up Object from Floor Unable to try/needs assist to keep balance   From Standing Position, Turn to Look Behind Over each Shoulder Needs assist to keep from losing balance and falling   Turn 360 Degrees Needs assistance while turning   Standing Unsupported, Alternately Place Feet on  Step/Stool Needs assistance to keep from falling or unable to try   Standing Unsupported, One Foot in Front Loses balance while stepping or standing   Standing on One Leg Unable to try or needs assist to prevent fall   Total Score 21   Berg comment: 21/56                     [facility] Adult PT Treatment/Exercise - 11/12/16 1039      Ambulation/Gait   Ambulation/Gait Yes   Ambulation/Gait Assistance 5: Supervision   Ambulation/Gait Assistance Details R knee brace donned   Ambulation Distance (Feet) 230 Feet   rested, then walked 120 feet.   Assistive device Rollator   Ambulation Surface Level;Indoor   Gait velocity 1.16 ft/sec   Gait Comments Patient negotiated tight spaces to mimic home environment between chairs, around tables, turning to sit near a table, etc.                  PT Education - 11/12/16 1221    Education provided Yes   Education Details Discussed d/c planning and recommended we talk with spouse at next session for long term plan.  *Current month's attendance hindered due to hospitalization with pacemaker placement   Person(s) Educated Patient;Spouse   Methods Explanation   Comprehension Verbalized understanding          PT Short Term Goals - 09/14/16 1152      PT SHORT TERM GOAL #1   Title The patient will perform HEP with assist from wife as needed for LE strengthening, standing balance and general mobility.  TARGET DATE FOR ALL STGs:  09/13/16   Baseline Patient has HEP that his wife is assisting with--PT to continue to progress.    Time 4   Period Weeks   Status Achieved     PT SHORT TERM GOAL #2   Title The patient will improve Berg score from 11/56 to > or equal to 20/56 to demo improving ability stand without UE support for ADLs.   Baseline Improved from 11/56 up to 17/56.    Time 4   Period Weeks   Status Partially Met     PT SHORT TERM GOAL #3   Title The patient will improve gait speed from 0.65 ft/sec to > or equal to 1.3  ft/sec to demo improved household ambulation (with least restrictive assistive device).   Baseline Improved from 0.65 ft/sec up to 0.89 ft/sec.   Time 4   Period Weeks   Status Partially Met     PT SHORT TERM GOAL #4   Title The patient will perform sit<>stand with UE support modified indep.   Baseline PT provides supervision consistently.     Time 4  Period Weeks   Status Partially Met     PT SHORT TERM GOAL #5   Title The patient will be furthe assessed on stairs and LTG to follow.   Baseline The patient requires min A for stairs x 4 steps.   Time 4   Period Weeks   Status Achieved     PT SHORT TERM GOAL #6   Title The patient will ambulate for 150 ft with least restrictive device modified indep.   Baseline Patient has improved to CGA for gait x 115 feet nonstop.   Time 8   Period Weeks   Status Partially Met           PT Long Term Goals - 11/12/16 1053      PT LONG TERM GOAL #1   Title The patient will perform HEP with assist from spouse.   Time 4   Period Weeks   Status On-going   Target Date 11/15/16     PT LONG TERM GOAL #2   Title The patient will increase ambulation to move x 350 ft nonstop to demo improved household ambulation using rollater RW with supervision.   Baseline IMproved to 230 feet nonstop, short rest and then 120 feet.   Time 4   Period Weeks   Status Partially Met     PT LONG TERM GOAL #3   Title The patient will improve gait speed from 1.13 ft/sec up to 1.4 ft/sec to demo improving mobility with rollater RW.   Baseline Patient's gait speed is 1.16 ft/sec (up from 0.65 ft/sec at evaluation).   Time 4   Period Weeks   Status Not Met     PT LONG TERM GOAL #4   Title The patient will negotiate obstacles to mimic household surfaces (narrow walking spaces, turning in tight spaces, etc) with rollater RW and supervision for improved safety for home.   Baseline Patient requires supervision for tight spaces/turns and negotiating obstacles.    Time 4   Period Weeks   Status Achieved     PT LONG TERM GOAL #5   Title Improve Berg from 19/56 up to 22/56 to demo improving standing balance for ADLs.   Baseline Improved to 21/56 today.   Time 4   Period Weeks   Status Partially Met               Plan - 11/12/16 1225    Clinical Impression Statement The patient is partially meeting LTGs for Berg balance, ambulation distance, and negotiating obstacles with rollater RW.  He has not changed gait speed over past 30 days.  PT to discuss with patient and spouse plan for discharge as he has a current HEP and will continue to require supervision for household/short community distances ambulation.  We began to discuss today, however spouse (caregiver) not present and I feel she should be part of this discussion.  Plan to check remaining LTGs and ensure d/c plan.    Clinical Impairments Affecting Rehab Potential Barriers to rehab include multiple CVAs, diminished sensation.  Enhancers to rehab potential include family support, patient motivation.   PT Treatment/Interventions ADLs/Self Care Home Management;Therapeutic activities;Therapeutic exercise;Neuromuscular re-education;Patient/family education;Functional mobility training;Gait training;DME Instruction;Stair training;Manual techniques;Orthotic Fit/Training;Wheelchair mobility training   PT Next Visit Plan CHECK LTGs and make plan, R knee stretching, review prior HEP and progress to tolerance.   Consulted and Agree with Plan of Care Patient      Patient will benefit from skilled therapeutic intervention in order to improve the  following deficits and impairments:  Abnormal gait, Decreased balance, Difficulty walking, Decreased strength, Decreased mobility, Impaired sensation, Postural dysfunction, Impaired flexibility, Pain  Visit Diagnosis: Other abnormalities of gait and mobility  Muscle weakness (generalized)  Unsteadiness on feet     Problem List Patient Active Problem List    Diagnosis Date Noted  . Second degree Mobitz II AV block 10/23/2016  . Seizures ([place]) 10/09/2016  . Healthcare maintenance 10/01/2016  . Restlessness and agitation 07/02/2016  . Vascular dementia with behavior disturbance 06/29/2016  . Hemiparesis and other late effects of cerebrovascular accident ([place]) 06/29/2016  . Embolic stroke ([place]) 40/68/4033  . Right hemiparesis ([place])   . History of CVA with residual deficit   . Chronic anticoagulation   . Atrial fibrillation with rapid ventricular response ([place])   . Seizure prophylaxis   . Diastolic dysfunction   . Bacteremia due to Gram-positive bacteria   . Altered mental status   . Dementia without behavioral disturbance   . Leukocytosis   . Acute blood loss anemia   . Acute encephalopathy 06/04/2016  . PAF (paroxysmal atrial fibrillation) ([place])   . Iron deficiency anemia 04/04/2016  . History of CVA (cerebrovascular accident) 04/04/2016  . Cerebral hemorrhage ([facility]) w/ SDH s/p IV tPA   . Coronary artery disease involving native coronary artery of native heart without angina pectoris   . Orthostatic hypotension   . History of right hip replacement   . Acute ischemic stroke ([place]) - L temporal lobe s/p tPA 03/30/2016  . BPH (benign prostatic hyperplasia) 11/25/2015  . GERD (gastroesophageal reflux disease) 11/25/2015  . CKD (chronic kidney disease), stage II 11/25/2015  . Overactive bladder   . Cerebrovascular accident (CVA) ([place]) 09/18/2015  . Gait disturbance, post-stroke 06/23/2015  . 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 Thalamic infarction ([place]) 06/21/2015  . Benign essential HTN   . Type 2 diabetes mellitus with complication, without long-term current use of insulin ([place])   . Dysphagia as late effect of cerebrovascular disease   . Hyponatremia   . HLD (hyperlipidemia)   . COPD (chronic obstructive pulmonary disease) ([place]) 03/09/2011    Varshini Arrants, PT 11/12/2016, 12:27 PM  [facility] [address] [place], [place], [address] Phone: [phone]   Fax:  [phone]  Name: Herbert Marquez MRN: [id number] Date of Birth: 03-27-1927

## 2016-11-14 ENCOUNTER — Ambulatory Visit: Payer: Medicare Other | Admitting: Rehabilitative and Restorative Service Providers"

## 2016-11-14 DIAGNOSIS — R2681 Unsteadiness on feet: Secondary | ICD-10-CM

## 2016-11-14 DIAGNOSIS — R2689 Other abnormalities of gait and mobility: Secondary | ICD-10-CM | POA: Diagnosis not present

## 2016-11-14 DIAGNOSIS — M6281 Muscle weakness (generalized): Secondary | ICD-10-CM | POA: Diagnosis not present

## 2016-11-14 NOTE — Therapy (Signed)
[facility] [address] [place], [place], [address] Phone: [phone]   Fax:  [phone]  Physical Therapy Treatment  Patient Details  Name: Herbert Marquez MRN: [id number] Date of Birth: [birth year] Referring Provider: Alysia Penna, MD  Encounter Date: 11/14/2016      PT End of Session - 11/14/16 2044    Visit Number 17   Number of Visits 20   Date for PT Re-Evaluation 11/15/16   Authorization Type G code every 10th visit   PT Start Time 1104   PT Stop Time 1148   PT Time Calculation (min) 44 min   Equipment Utilized During Treatment Gait belt   Activity Tolerance Patient tolerated treatment well   Behavior During Therapy [facility] for tasks assessed/performed      Past Medical History:  Diagnosis Date  . Arthritis    "pretty much all over"   . Basal cell carcinoma    "several burned off his body, face, head"  . BPH (benign prostatic hypertrophy)   . Coronary artery disease    a. s/p PCI of RCA in 2006  . CVA (cerebral infarction)    a. 06/2015: left thalamic and bilateral PCA  . GERD (gastroesophageal reflux disease)   . Hyperlipidemia   . Hypertension   . Presence of permanent cardiac pacemaker   . Stroke ([place])   . TIA (transient ischemic attack)    Approximately 6 weeks post-cardiac catheterization.     Past Surgical History:  Procedure Laterality Date  . CARDIOVASCULAR STRESS TEST  07/01/2007   EF 74%  . CATARACT EXTRACTION, BILATERAL    . CORONARY ANGIOPLASTY WITH STENT PLACEMENT  10/2004   stenting x 2 to RCA  . FEMUR IM NAIL Right 11/26/2015   Procedure: INTRAMEDULLARY RIGHT (IM) NAIL FEMORAL;  Surgeon: Rod Can, MD;  Location: [facility];  Service: Orthopedics;  Laterality: Right;  . FRACTURE SURGERY    . HERNIA REPAIR    . HIP ARTHROPLASTY  03/09/2011   Procedure: ARTHROPLASTY BIPOLAR HIP;  Surgeon: Mauri Pole;  Location: [facility];  Service: Orthopedics;  Laterality: Left;  . INSERT /  REPLACE / REMOVE PACEMAKER  10/23/2016  . LAPAROSCOPIC INCISIONAL / UMBILICAL / VENTRAL HERNIA REPAIR     "below his naval"  . PACEMAKER IMPLANT N/A 10/23/2016   Procedure: Pacemaker Implant;  Surgeon: Thompson Grayer, MD;  Location: [facility];  Service: Cardiovascular;  Laterality: N/A;    There were no vitals filed for this visit.      Subjective Assessment - 11/14/16 1112    Subjective The patient notes he is getting back to his baseline level of mobility prior to recent hospitalization for pacemaker.    Pertinent History L hip replace, R hip fracture, h/o multiple CVAs, vascular dementia.   Patient Stated Goals Trying to get back to walking.   Currently in Pain? No/denies  R knee pain, to be monitored t/o session                         [facility] Adult PT Treatment/Exercise - 11/14/16 1136      Ambulation/Gait   Ambulation/Gait Yes   Ambulation/Gait Assistance 5: Supervision   Ambulation/Gait Assistance Details R knee brace   Ambulation Distance (Feet) 350 Feet  100 ft x 2   Assistive device Rollator   Ambulation Surface Level;Indoor   Gait Comments Patient benefits from verbal cues to perfomr deep breathing and slow pace during shortness  of breath.     Self-Care   Self-Care Other Self-Care Comments   Other Self-Care Comments  Discussed PT goals and recommendations on increasing distance in home.  *Knee brace is a barrier as he only donns to walk and then removes.  He notes a softer, more comfortable brace from IP rehab that he used.  PT recomended he bring in with him so we can increase functional gait.  AT this time gait is still "exercise" versus "mobilty" in day to day activities.  PT discussed walking from w/c in living room to kitchen and bathroom with rollater to increase gait t/o day.  Discussed PT plan--see at end of note.     Exercises   Exercises Other Exercises   Other Exercises  Supine thomas test stretch bilaterally, supine quad sets with R  heel elevated to increase extensor moment x 5 second holds, supine gluteal sets w ith 5 second holds.  PROM hamstring stretch R side x 3 reps.                  PT Short Term Goals - 09/14/16 1152      PT SHORT TERM GOAL #1   Title The patient will perform HEP with assist from wife as needed for LE strengthening, standing balance and general mobility.  TARGET DATE FOR ALL STGs:  09/13/16   Baseline Patient has HEP that his wife is assisting with--PT to continue to progress.    Time 4   Period Weeks   Status Achieved     PT SHORT TERM GOAL #2   Title The patient will improve Berg score from 11/56 to > or equal to 20/56 to demo improving ability stand without UE support for ADLs.   Baseline Improved from 11/56 up to 17/56.    Time 4   Period Weeks   Status Partially Met     PT SHORT TERM GOAL #3   Title The patient will improve gait speed from 0.65 ft/sec to > or equal to 1.3 ft/sec to demo improved household ambulation (with least restrictive assistive device).   Baseline Improved from 0.65 ft/sec up to 0.89 ft/sec.   Time 4   Period Weeks   Status Partially Met     PT SHORT TERM GOAL #4   Title The patient will perform sit<>stand with UE support modified indep.   Baseline PT provides supervision consistently.     Time 4   Period Weeks   Status Partially Met     PT SHORT TERM GOAL #5   Title The patient will be furthe assessed on stairs and LTG to follow.   Baseline The patient requires min A for stairs x 4 steps.   Time 4   Period Weeks   Status Achieved     PT SHORT TERM GOAL #6   Title The patient will ambulate for 150 ft with least restrictive device modified indep.   Baseline Patient has improved to CGA for gait x 115 feet nonstop.   Time 8   Period Weeks   Status Partially Met           PT Long Term Goals - 11/14/16 1113      PT LONG TERM GOAL #1   Title The patient will perform HEP with assist from spouse.   Time 4   Period Weeks   Status  On-going     PT LONG TERM GOAL #2   Title The patient will increase ambulation to move x 350  ft nonstop to demo improved household ambulation using rollater RW with supervision.   Baseline Met on 11/14/16 with close supervision.   Time 4   Period Weeks   Status Achieved     PT LONG TERM GOAL #3   Title The patient will improve gait speed from 1.13 ft/sec up to 1.4 ft/sec to demo improving mobility with rollater RW.   Baseline Patient's gait speed is 1.16 ft/sec (up from 0.65 ft/sec at evaluation).   Time 4   Period Weeks   Status Not Met     PT LONG TERM GOAL #4   Title The patient will negotiate obstacles to mimic household surfaces (narrow walking spaces, turning in tight spaces, etc) with rollater RW and supervision for improved safety for home.   Baseline Patient requires supervision for tight spaces/turns and negotiating obstacles.   Time 4   Period Weeks   Status Achieved     PT LONG TERM GOAL #5   Title Improve Berg from 19/56 up to 22/56 to demo improving standing balance for ADLs.   Baseline Improved to 21/56 today.   Time 4   Period Weeks   Status Partially Met               Plan - 11/14/16 2050    Clinical Impression Statement The patient is functioning at a w/c level for majority of household ambulation, using gait for exercise daily and then walking short distances into familiar community locations with wife's assistance.  We discussed that at this time our focus should be increasing his walking distance in the home and using gait in day to day activities such as walking to kitchen table (he sits at bar to read paper and eat),   PT also wants to ensure HEP is up to date prior to d/c.  Plan to renew for 2 more visits due to patient not meeting frequency this certification period due to hospitalization.   PT Treatment/Interventions ADLs/Self Care Home Management;Therapeutic activities;Therapeutic exercise;Neuromuscular re-education;Patient/family  education;Functional mobility training;Gait training;DME Instruction;Stair training;Manual techniques;Orthotic Fit/Training;Wheelchair mobility training   PT Next Visit Plan Update LTGs, check HEP/ add hamstring stretching.  Stair assessment and write down gait activities with soft knee brace.   Consulted and Agree with Plan of Care Patient;Family member/caregiver   Family Member Consulted spouse      Patient will benefit from skilled therapeutic intervention in order to improve the following deficits and impairments:  Abnormal gait, Decreased balance, Difficulty walking, Decreased strength, Decreased mobility, Impaired sensation, Postural dysfunction, Impaired flexibility, Pain  Visit Diagnosis: Other abnormalities of gait and mobility  Muscle weakness (generalized)  Unsteadiness on feet     Problem List Patient Active Problem List   Diagnosis Date Noted  . Second degree Mobitz II AV block 10/23/2016  . Seizures ([place]) 10/09/2016  . Healthcare maintenance 10/01/2016  . Restlessness and agitation 07/02/2016  . Vascular dementia with behavior disturbance 06/29/2016  . Hemiparesis and other late effects of cerebrovascular accident ([place]) 06/29/2016  . Embolic stroke ([place]) 81/27/5170  . Right hemiparesis ([place])   . History of CVA with residual deficit   . Chronic anticoagulation   . Atrial fibrillation with rapid ventricular response ([place])   . Seizure prophylaxis   . Diastolic dysfunction   . Bacteremia due to Gram-positive bacteria   . Altered mental status   . Dementia without behavioral disturbance   . Leukocytosis   . Acute blood loss anemia   . Acute encephalopathy 06/04/2016  . PAF (paroxysmal atrial  fibrillation) ([place])   . Iron deficiency anemia 04/04/2016  . History of CVA (cerebrovascular accident) 04/04/2016  . Cerebral hemorrhage (H[facility]) w/ SDH s/p IV tPA   . Coronary artery disease involving native coronary artery of native heart without angina pectoris   .  Orthostatic hypotension   . History of right hip replacement   . Acute ischemic stroke ([place]) - L temporal lobe s/p tPA 03/30/2016  . BPH (benign prostatic hyperplasia) 11/25/2015  . GERD (gastroesophageal reflux disease) 11/25/2015  . CKD (chronic kidney disease), stage II 11/25/2015  . Overactive bladder   . Cerebrovascular accident (CVA) ([place]) 09/18/2015  . Gait disturbance, post-stroke 06/23/2015  . 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 Thalamic infarction ([place]) 06/21/2015  . Benign essential HTN   . Type 2 diabetes mellitus with complication, without long-term current use of insulin ([place])   . Dysphagia as late effect of cerebrovascular disease   . Hyponatremia   . HLD (hyperlipidemia)   . COPD (chronic obstructive pulmonary disease) ([place]) 03/09/2011    Zacchary Pompei, PT 11/14/2016, 8:54 PM  [facility] [address] [place], [place], [address] Phone: [phone]   Fax:  [phone]  Name: LJ MIYAMOTO MRN: [id number] Date of Birth: 1927-03-11

## 2016-11-15 ENCOUNTER — Other Ambulatory Visit: Payer: Self-pay | Admitting: Physical Medicine & Rehabilitation

## 2016-11-16 DIAGNOSIS — I10 Essential (primary) hypertension: Secondary | ICD-10-CM | POA: Diagnosis not present

## 2016-11-16 DIAGNOSIS — N390 Urinary tract infection, site not specified: Secondary | ICD-10-CM | POA: Diagnosis not present

## 2016-11-19 ENCOUNTER — Ambulatory Visit: Payer: Medicare Other | Admitting: Rehabilitative and Restorative Service Providers"

## 2016-11-26 ENCOUNTER — Ambulatory Visit: Payer: Medicare Other | Admitting: Rehabilitative and Restorative Service Providers"

## 2016-11-26 DIAGNOSIS — R2689 Other abnormalities of gait and mobility: Secondary | ICD-10-CM

## 2016-11-26 DIAGNOSIS — M6281 Muscle weakness (generalized): Secondary | ICD-10-CM | POA: Diagnosis not present

## 2016-11-26 DIAGNOSIS — R2681 Unsteadiness on feet: Secondary | ICD-10-CM

## 2016-11-26 NOTE — Patient Instructions (Signed)
Weight Shift: Anterior / Posterior (Righting / Equilibrium)    Begin leaning with your hips supported on wall or countertop with your walker in front of you.  Move your hips away from the wall or counter and stand tall holding 5 seconds.  Then return to rest against the wall/countertop.   Repeat __10__ times per session. Do __1__ sessions per day.  Copyright  VHI. All rights reserved.   Bracing With Bridging     With feet and knees apart, tighten your abdominals and lift bottom. Hold for 3 counts. Slowly lower. Try to keep your knees and body as still as possible. Repeat __5-10_ times. Do _2__ times a day.   Copyright  VHI. All rights reserved.  Chair Sitting    Sit at edge of seat, spine straight, one leg extended. Put a hand on each thigh and bend forward from the hip, keeping spine straight. Allow hand on extended leg to reach toward toes. Support upper body with other arm. Hold _30__ seconds. Repeat __3_ times per session. Do _2__ sessions per day.  Copyright  VHI. All rights reserved.    Functional Quadriceps: Sit to Stand    Sit on edge of chair, feet flat on floor.  CAN USE YOUR HANDS TO HELP PUSH UP AND HAVE THE WALKER IN FRONT OF YOU.  Stand upright, extending knees fully. Repeat __10__ times per set. Do __1__ sets per session. Do __2__ sessions per day.  http://orth.exer.us/734   Copyright  VHI. All rights reserved.   Long [employer]    Straighten operated leg and try to hold it __5__ seconds. No weights. Repeat __10__ times. Do ____ sessions a day.  http://gt2.exer.us/310   Copyright  VHI. All rights reserved.   Kitchen Sink Height    Stand at [employer] or sink and practice reaching right arm overhead to touch cabinet and then left arm.     Copyright  VHI. All rights reserved.

## 2016-11-27 NOTE — Therapy (Signed)
[facility] [address] [place], [place], [address] Phone: [phone]   Fax:  [phone]  Physical Therapy Treatment and Discharge Summary  Patient Details  Name: Herbert Marquez MRN: [id number] Date of Birth: 02/25/1928 Referring Provider: Alysia Penna, MD  Encounter Date: 11/26/2016      PT End of Session - 11/26/16 1519    Visit Number 18   Number of Visits 20   Date for PT Re-Evaluation 11/15/16   Authorization Type G code every 10th visit   PT Start Time 1320   PT Stop Time 1400   PT Time Calculation (min) 40 min   Equipment Utilized During Treatment Gait belt   Activity Tolerance Patient tolerated treatment well   Behavior During Therapy [facility] for tasks assessed/performed      Past Medical History:  Diagnosis Date  . Arthritis    "pretty much all over"   . Basal cell carcinoma    "several burned off his body, face, head"  . BPH (benign prostatic hypertrophy)   . Coronary artery disease    a. s/p PCI of RCA in 2006  . CVA (cerebral infarction)    a. 06/2015: left thalamic and bilateral PCA  . GERD (gastroesophageal reflux disease)   . Hyperlipidemia   . Hypertension   . Presence of permanent cardiac pacemaker   . Stroke ([place])   . TIA (transient ischemic attack)    Approximately 6 weeks post-cardiac catheterization.     Past Surgical History:  Procedure Laterality Date  . CARDIOVASCULAR STRESS TEST  07/01/2007   EF 74%  . CATARACT EXTRACTION, BILATERAL    . CORONARY ANGIOPLASTY WITH STENT PLACEMENT  10/2004   stenting x 2 to RCA  . FEMUR IM NAIL Right 11/26/2015   Procedure: INTRAMEDULLARY RIGHT (IM) NAIL FEMORAL;  Surgeon: Rod Can, MD;  [facility]: [facility];  Service: Orthopedics;  Laterality: Right;  . FRACTURE SURGERY    . HERNIA REPAIR    . HIP ARTHROPLASTY  03/09/2011   Procedure: ARTHROPLASTY BIPOLAR HIP;  Surgeon: Mauri Pole;  [facility];  Service: Orthopedics;   Laterality: Left;  . INSERT / REPLACE / REMOVE PACEMAKER  10/23/2016  . LAPAROSCOPIC INCISIONAL / UMBILICAL / VENTRAL HERNIA REPAIR     "below his naval"  . PACEMAKER IMPLANT N/A 10/23/2016   Procedure: Pacemaker Implant;  Surgeon: Thompson Grayer, MD;  Location: [facility];  Service: Cardiovascular;  Laterality: N/A;    There were no vitals filed for this visit.      Subjective Assessment - 11/26/16 1317    Subjective The patient notes that he is waking up every morning with dizziness.  He notes intermittently getting dizzy when looking down.  He recently had waxed removed from ears at ENT.  The patient also had a UTI recently and has been back on antibiotics to treat.  He also tried soft knee brace at home and said that he could not tolerate walking far due to increased pain with soft brace.  He does not want to wear larger brace all day as he feels it is not comfortable fo rall day use.    Pertinent History L hip replace, R hip fracture, h/o multiple CVAs, vascular dementia.   Patient Stated Goals Trying to get back to walking.   Currently in Pain? No/denies  knee hurts at times                Vestibular Assessment - 11/26/16 1520  Positional Testing   Dix-Hallpike Dix-Hallpike Right;Dix-Hallpike Left   Horizontal Canal Testing Horizontal Canal Right;Horizontal Canal Left                 [facility] Adult PT Treatment/Exercise - 11/26/16 1334      Ambulation/Gait   Ambulation/Gait Yes   Ambulation/Gait Assistance 5: Supervision   Ambulation/Gait Assistance Details R knee brace   Ambulation Distance (Feet) 150 Feet  x 2 reps   Assistive device Rollator   Ambulation Surface Level;Indoor   Stairs Yes   Stairs Assistance 4: Min guard   Stair Management Technique Two rails;Step to pattern   Number of Stairs 4   Gait Comments Patient maintains minimal R knee flexion and continues to need cues to get R heel strike at initial contact phase of gait.      Self-Care   Self-Care Other Self-Care Comments   Other Self-Care Comments  Discussed stairs at home, home exercise porgram.after discharge from PT.       Neuro Re-ed    Neuro Re-ed Details  Reviewed wall bumps, sit>stand and standing right weight shift activities.     Exercises   Exercises Other Exercises   Other Exercises  Reviewed prior HEP and recommended patient continue key group of hamstring stretching, long arc quads, sit<>stand, bridges, standing balance and wall bumps post d/c.                 PT Education - 11/26/16 1518    Education provided Yes   Education Details Consolidated HEP to focus on 6 activities + walking. Discussed recommendations that patient continue working to progress ambulation tolerance at home as he has a supportive caregiver who can provide close supervision for safety.    Person(s) Educated Patient;Spouse   Methods Explanation;Demonstration;Handout   Comprehension Verbalized understanding;Returned demonstration          PT Short Term Goals - 09/14/16 1152      PT SHORT TERM GOAL #1   Title The patient will perform HEP with assist from wife as needed for LE strengthening, standing balance and general mobility.  TARGET DATE FOR ALL STGs:  09/13/16   Baseline Patient has HEP that his wife is assisting with--PT to continue to progress.    Time 4   Period Weeks   Status Achieved     PT SHORT TERM GOAL #2   Title The patient will improve Berg score from 11/56 to > or equal to 20/56 to demo improving ability stand without UE support for ADLs.   Baseline Improved from 11/56 up to 17/56.    Time 4   Period Weeks   Status Partially Met     PT SHORT TERM GOAL #3   Title The patient will improve gait speed from 0.65 ft/sec to > or equal to 1.3 ft/sec to demo improved household ambulation (with least restrictive assistive device).   Baseline Improved from 0.65 ft/sec up to 0.89 ft/sec.   Time 4   Period Weeks   Status Partially Met     PT SHORT  TERM GOAL #4   Title The patient will perform sit<>stand with UE support modified indep.   Baseline PT provides supervision consistently.     Time 4   Period Weeks   Status Partially Met     PT SHORT TERM GOAL #5   Title The patient will be furthe assessed on stairs and LTG to follow.   Baseline The patient requires min A for stairs x 4 steps.  Time 4   Period Weeks   Status Achieved     PT SHORT TERM GOAL #6   Title The patient will ambulate for 150 ft with least restrictive device modified indep.   Baseline Patient has improved to CGA for gait x 115 feet nonstop.   Time 8   Period Weeks   Status Partially Met           PT Long Term Goals - December 26, 2016 1519      PT LONG TERM GOAL #1   Title The patient will perform HEP with assist from spouse.   Time 4   Period Weeks   Status Achieved     PT LONG TERM GOAL #2   Title The patient will increase ambulation to move x 350 ft nonstop to demo improved household ambulation using rollater RW with supervision.   Baseline Met on 11/14/16 with close supervision.   Time 4   Period Weeks   Status Achieved     PT LONG TERM GOAL #3   Title The patient will improve gait speed from 1.13 ft/sec up to 1.4 ft/sec to demo improving mobility with rollater RW.   Baseline Patient's gait speed is 1.16 ft/sec (up from 0.65 ft/sec at evaluation).   Time 4   Period Weeks   Status Not Met     PT LONG TERM GOAL #4   Title The patient will negotiate obstacles to mimic household surfaces (narrow walking spaces, turning in tight spaces, etc) with rollater RW and supervision for improved safety for home.   Baseline Patient requires supervision for tight spaces/turns and negotiating obstacles.   Time 4   Period Weeks   Status Achieved     PT LONG TERM GOAL #5   Title Improve Berg from 19/56 up to 22/56 to demo improving standing balance for ADLs.   Baseline Improved to 21/56 today.   Time 4   Period Weeks   Status Partially Met                Plan - 11/27/16 0949    Clinical Impression Statement The patient has met 3 LTGs in PT.  He is continuing to ambulate at a short distance household and limited community functional level.  He has a comprehensive home program and is waking with wife's assist.  We discussed that further therapy will not change current mobility status at this time as I think he will continue to need assistance in home and community.    PT Treatment/Interventions ADLs/Self Care Home Management;Therapeutic activities;Therapeutic exercise;Neuromuscular re-education;Patient/family education;Functional mobility training;Gait training;DME Instruction;Stair training;Manual techniques;Orthotic Fit/Training;Wheelchair mobility training   PT Next Visit Plan Discharge today   Consulted and Agree with Plan of Care Patient      Patient will benefit from skilled therapeutic intervention in order to improve the following deficits and impairments:  Abnormal gait, Decreased balance, Difficulty walking, Decreased strength, Decreased mobility, Impaired sensation, Postural dysfunction, Impaired flexibility, Pain  Visit Diagnosis: Other abnormalities of gait and mobility  Muscle weakness (generalized)  Unsteadiness on feet       G-Codes - 2016/12/26 0951    Functional Assessment Tool Used (Outpatient Only) Berg=21/56 and gait speed 1.16 ft/sec with rollater RW.   Functional Limitation Mobility: Walking and moving around   Mobility: Walking and Moving Around Goal Status [phone]) At least 40 percent but less than 60 percent impaired, limited or restricted   Mobility: Walking and Moving Around Discharge Status [phone]) At least 60 percent but less than 80  percent impaired, limited or restricted     PHYSICAL THERAPY DISCHARGE SUMMARY  Visits from Start of Care: 18  Current functional level related to goals / functional outcomes: See above   Remaining deficits: Chronic deficits related to multiple CVAs (R  weakness, gait instability, motor planning deficits) R knee pain Imbalance   Education / Equipment: Home program, walking, caregiver and patient education.  Plan: Patient agrees to discharge.  Patient goals were partially met. Patient is being discharged due to meeting the stated rehab goals.  ?????        Thank you for the referral of this patient. Rudell Cobb, MPT    Jennilyn Esteve 11/27/2016, 9:52 AM  [name] [address] [place], [place], [address] Phone: [phone]   Fax:  [phone]  Name: Herbert Marquez MRN: [id number] Date of Birth: August 05, 1927

## 2016-11-27 NOTE — Addendum Note (Signed)
Addended by: Rudell Cobb M on: 11/27/2016 09:57 AM   Modules accepted: Orders

## 2016-12-03 ENCOUNTER — Other Ambulatory Visit: Payer: Self-pay | Admitting: Adult Health

## 2016-12-03 ENCOUNTER — Ambulatory Visit (INDEPENDENT_AMBULATORY_CARE_PROVIDER_SITE_OTHER): Payer: Medicare Other | Admitting: Family Medicine

## 2016-12-03 ENCOUNTER — Encounter: Payer: Self-pay | Admitting: Adult Health

## 2016-12-03 ENCOUNTER — Encounter: Payer: Self-pay | Admitting: Family Medicine

## 2016-12-03 VITALS — BP 134/83 | HR 87 | Ht 67.0 in | Wt 166.0 lb

## 2016-12-03 DIAGNOSIS — R12 Heartburn: Secondary | ICD-10-CM

## 2016-12-03 DIAGNOSIS — R350 Frequency of micturition: Secondary | ICD-10-CM | POA: Diagnosis not present

## 2016-12-03 DIAGNOSIS — I639 Cerebral infarction, unspecified: Secondary | ICD-10-CM

## 2016-12-03 LAB — POCT URINALYSIS DIPSTICK
Bilirubin, UA: NEGATIVE
Glucose, UA: NEGATIVE
Ketones, UA: NEGATIVE
Leukocytes, UA: NEGATIVE
Nitrite, UA: NEGATIVE
Protein, UA: 30
Spec Grav, UA: 1.015 (ref 1.010–1.025)
Urobilinogen, UA: 0.2 E.U./dL
pH, UA: 7 (ref 5.0–8.0)

## 2016-12-03 NOTE — Progress Notes (Signed)
Pt here for an acute care OV today   Subjective:    HPI: Herbert Marquez is a 83 y.o. male who Is a patient at our practice and new to me, presents to [place] [facility] today for an acute visit for c/o dysuria.  Patient was recently seen by the urgent care on 8\21\18 for was updated urinary tract infection.  He was placed on Keflex for 7-10 days according to the wife.  His symptoms did improve but then started back about 2 days ago.  He has not seen his urologist in some time.  States they "don't do much for him when he goes" anyhow.  Sx for 3days.  Now worse    C/O: n dysuria y increased frequency n increased urgency no malodorous urination n Nausea n Fever/ Chills n Back Pain n vaginal/penile discharge Monogamous currently; partner w/o STI Yes-  ABX usage past 30 d;  Keflex on 10/24/16 as apparently patient was discharged from Dr. Rayann Marquez and his urinalysis was consistent with a UTI.  That's all history the history I could find in the notes.  No problems updated.  Urinalysis    Component Value Date/Time   COLORURINE YELLOW 10/23/2016 Herbert Marquez 10/23/2016 1353   LABSPEC 1.008 10/23/2016 1353   PHURINE 8.0 10/23/2016 1353   GLUCOSEU NEGATIVE 10/23/2016 1353   HGBUR NEGATIVE 10/23/2016 1353   BILIRUBINUR negative 12/03/2016 1421   KETONESUR NEGATIVE 10/23/2016 1353   PROTEINUR 30 12/03/2016 1421   PROTEINUR NEGATIVE 10/23/2016 1353   UROBILINOGEN 0.2 12/03/2016 1421   UROBILINOGEN 0.2 08/31/2011 1033   NITRITE negative 12/03/2016 1421   NITRITE NEGATIVE 10/23/2016 1353   LEUKOCYTESUR Negative 12/03/2016 1421    Wt Readings from Last 3 Encounters:  12/03/16 166 lb (75.3 kg)  10/24/16 162 lb 0.6 oz (73.5 kg)  10/09/16 162 lb 12.8 oz (73.8 kg)   BP Readings from Last 3 Encounters:  12/03/16 134/83  10/24/16 120/71  10/23/16 (!) 160/98   Pulse Readings from Last 3 Encounters:  12/03/16 87  10/24/16 63  10/23/16 (!) 115    BMI Readings from Last 3 Encounters:  12/03/16 26.00 kg/m  10/24/16 25.38 kg/m  10/09/16 25.50 kg/m     Patient Active Problem List   Diagnosis Date Noted  . Second degree Mobitz II AV block 10/23/2016  . Seizures ([place]) 10/09/2016  . Healthcare maintenance 10/01/2016  . Restlessness and agitation 07/02/2016  . Vascular dementia with behavior disturbance 06/29/2016  . Hemiparesis and other late effects of cerebrovascular accident ([place]) 06/29/2016  . Embolic stroke ([place]) 40/98/1191  . Right hemiparesis ([place])   . History of CVA with residual deficit   . Chronic anticoagulation   . Atrial fibrillation with rapid ventricular response ([place])   . Seizure prophylaxis   . Diastolic dysfunction   . Bacteremia due to Gram-positive bacteria   . Altered mental status   . Dementia without behavioral disturbance   . Leukocytosis   . Acute blood loss anemia   . Acute encephalopathy 06/04/2016  . PAF (paroxysmal atrial fibrillation) ([place])   . Iron deficiency anemia 04/04/2016  . History of CVA (cerebrovascular accident) 04/04/2016  . Cerebral hemorrhage (HCC) w/ SDH s/p IV tPA   . Coronary artery disease involving native coronary artery of native heart without angina pectoris   . Orthostatic hypotension   . History of right hip replacement   . Acute ischemic stroke (HCC) - L temporal lobe s/p tPA  03/30/2016  . BPH (benign prostatic hyperplasia) 11/25/2015  . GERD (gastroesophageal reflux disease) 11/25/2015  . CKD (chronic kidney disease), stage II 11/25/2015  . Overactive bladder   . Cerebrovascular accident (CVA) ([place]) 09/18/2015  . Gait disturbance, post-stroke 06/23/2015  . 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 Thalamic infarction ([place]) 06/21/2015  . Benign essential HTN   . Type 2 diabetes mellitus with complication, without long-term current use of insulin ([place])   . Dysphagia as late effect of cerebrovascular disease   . Hyponatremia   . HLD (hyperlipidemia)   . COPD  (chronic obstructive pulmonary disease) ([place]) 03/09/2011    Past Surgical History:  Procedure Laterality Date  . CARDIOVASCULAR STRESS TEST  07/01/2007   EF 74%  . CATARACT EXTRACTION, BILATERAL    . CORONARY ANGIOPLASTY WITH STENT PLACEMENT  10/2004   stenting x 2 to RCA  . FEMUR IM NAIL Right 11/26/2015   Procedure: INTRAMEDULLARY RIGHT (IM) NAIL FEMORAL;  Surgeon: Rod Can, MD;  Location: [facility];  Service: Orthopedics;  Laterality: Right;  . FRACTURE SURGERY    . HERNIA REPAIR    . HIP ARTHROPLASTY  03/09/2011   Procedure: ARTHROPLASTY BIPOLAR HIP;  Surgeon: Herbert Marquez;  Location: [facility];  Service: Orthopedics;  Laterality: Left;  . INSERT / REPLACE / REMOVE PACEMAKER  10/23/2016  . LAPAROSCOPIC INCISIONAL / UMBILICAL / VENTRAL HERNIA REPAIR     "below his naval"  . PACEMAKER IMPLANT N/A 10/23/2016   Procedure: Pacemaker Implant;  Surgeon: Herbert Grayer, MD;  Location: [facility];  Service: Cardiovascular;  Laterality: N/A;    Family History  Problem Relation Age of Onset  . Hypertension Mother   . Lung cancer Father   . Lung cancer Brother     History  Drug Use No  ,  History  Alcohol Use  . 4.8 oz/week  . 7 Glasses of wine, 1 Cans of beer per week    Comment: 1/2 BEER AND 1 WINE  ,  History  Smoking Status  . Former Smoker  . Quit date: 03/06/1971  Smokeless Tobacco  . Never Used  ,  History  Sexual Activity  . Sexual activity: No    Patient's Medications  New Prescriptions   No medications on file  Previous Medications   APIXABAN (ELIQUIS) 5 MG TABS TABLET    Take 1 tablet (5 mg total) by mouth 2 (two) times daily. Resume 10/25/16 evening dose   ASPIRIN EC 81 MG TABLET    Take 1 tablet (81 mg total) by mouth daily.   ATORVASTATIN (LIPITOR) 80 MG TABLET    TAKE 1 TABLET BY MOUTH DAILY.   LEVETIRACETAM (KEPPRA) 500 MG TABLET    Take 1 tablet (500 mg total) by mouth 2 (two) times daily.   MULTIPLE VITAMIN (MULTIVITAMIN WITH MINERALS) TABS TABLET     Take 1 tablet by mouth daily.   NITROGLYCERIN (NITROSTAT) 0.4 MG SL TABLET    Place 1 tablet (0.4 mg total) under the tongue every 5 (five) minutes as needed for chest pain.   OMEPRAZOLE (PRILOSEC) 20 MG CAPSULE    Take 20 mg by mouth daily.   POLYVINYL ALCOHOL (ARTIFICIAL TEARS) 1.4 % OPHTHALMIC SOLUTION    Place 1 drop into both eyes daily as needed for dry eyes.   QUETIAPINE (SEROQUEL) 25 MG TABLET    TAKE 1 TABLET BY MOUTH AT BEDTIME.  Modified Medications   No medications on file  Discontinued Medications   No  medications on file    Patient has no known allergies.  Current Meds  Medication Sig  . apixaban (ELIQUIS) 5 MG TABS tablet Take 1 tablet (5 mg total) by mouth 2 (two) times daily. Resume 10/25/16 evening dose  . aspirin EC 81 MG tablet Take 1 tablet (81 mg total) by mouth daily.  [name] atorvastatin (LIPITOR) 80 MG tablet TAKE 1 TABLET BY MOUTH DAILY.  [name] levETIRAcetam (KEPPRA) 500 MG tablet Take 1 tablet (500 mg total) by mouth 2 (two) times daily.  . Multiple Vitamin (MULTIVITAMIN WITH MINERALS) TABS tablet Take 1 tablet by mouth daily.  . nitroGLYCERIN (NITROSTAT) 0.4 MG SL tablet Place 1 tablet (0.4 mg total) under the tongue every 5 (five) minutes as needed for chest pain.  [name] omeprazole (PRILOSEC) 20 MG capsule Take 20 mg by mouth daily.  . polyvinyl alcohol (ARTIFICIAL TEARS) 1.4 % ophthalmic solution Place 1 drop into both eyes daily as needed for dry eyes.  [name] QUEtiapine (SEROQUEL) 25 MG tablet TAKE 1 TABLET BY MOUTH AT BEDTIME.    Review of Systems: General:   No F/C, wt loss Pulm:   No DIB, pleuritic chest pain Card:  No CP, palpitations Abd:  No n/v/d or pain GU:  No Dysuria, + increased frequency and urgency; no discharge Ext:  No inc edema from baseline   Objective:  Blood pressure 134/83, pulse 87, height 5\' 7"  (1.702 m), weight 166 lb (75.3 kg). Body mass index is 26 kg/m.  General: Well Developed, well nourished, and in no acute distress.  HEENT:  Normocephalic, atraumatic Skin: Warm and dry, cap RF less 2 sec, good turgor CV: +S1, S2 Respiratory: ECTA B/L; speaking in full sentences, no conversational dyspnea Abd: Soft, NT, ND, No G/R/R, no SPT, No flank pain NeuroM-Sk: Ambulates w/o assistance, moves * 4 Psych: A and O *3    Impression and Recommendations:    1. Frequency of urination   2. Heartburn     Frequency of urination - Plan: POCT urinalysis dipstick- Which was completely negative.  There were no nitrates, no leukocytes and only a trace amount of blood.  The rest was unremarkable. - Follow up with his urologist for his urinary frequency symptoms.  Heartburn - Patient mentioned this upon me exiting the room for his acute care visit.  He will try over-the-counter omeprazole and ranitidine daily for 1 month and then follow up with their PCP regarding these symptoms.  The patient was counseled, risk factors were discussed, anticipatory guidance given.  Gross side effects, risk and benefits, and alternatives of medications discussed with patient.  Patient is aware that all medications have potential side effects and we are unable to predict every side effect or drug-drug interaction that may occur.  Expresses verbal understanding and consents to current therapy plan and treatment regimen.  No orders of the defined types were placed in this encounter.   Orders Placed This Encounter  Procedures  . POCT urinalysis dipstick     New Prescriptions   No medications on file    Modified Medications   No medications on file    No orders of the defined types were placed in this encounter.    Discontinued Medications   No medications on file      Return for F-up of current med issues as previously d/c pt by [facility] and 4 wks- reflux sx.

## 2016-12-03 NOTE — Patient Instructions (Addendum)
For your urinary frequency-please follow-up with your urologist as your urinalysis is negative today except for trace blood.   We did not culture your urine as there is no indication to.   Gastroesophageal Reflux Disease, Adult Normally, food travels down the esophagus and stays in the stomach to be digested. If a person has gastroesophageal reflux disease (GERD), food and stomach acid move back up into the esophagus. When this happens, the esophagus becomes sore and swollen (inflamed). Over time, GERD can make small holes (ulcers) in the lining of the esophagus. Follow these instructions at home: Diet  Follow a diet as told by your doctor. You may need to avoid foods and drinks such as: ? Coffee and tea (with or without caffeine). ? Drinks that contain alcohol. ? Energy drinks and sports drinks. ? Carbonated drinks or sodas. ? Chocolate and cocoa. ? Peppermint and mint flavorings. ? Garlic and onions. ? Horseradish. ? Spicy and acidic foods, such as peppers, chili powder, curry powder, vinegar, hot sauces, and BBQ sauce. ? Citrus fruit juices and citrus fruits, such as oranges, lemons, and limes. ? Tomato-based foods, such as red sauce, chili, salsa, and pizza with red sauce. ? Fried and fatty foods, such as donuts, french fries, potato chips, and high-fat dressings. ? High-fat meats, such as hot dogs, rib eye steak, sausage, ham, and bacon. ? High-fat dairy items, such as whole milk, butter, and cream cheese.  Eat small meals often. Avoid eating large meals.  Avoid drinking large amounts of liquid with your meals.  Avoid eating meals during the 2-3 hours before bedtime.  Avoid lying down right after you eat.  Do not exercise right after you eat. General instructions  Pay attention to any changes in your symptoms.  Take over-the-counter and prescription medicines only as told by your doctor. Do not take aspirin, ibuprofen, or other NSAIDs unless your doctor says it is  okay.  Do not use any tobacco products, including cigarettes, chewing tobacco, and e-cigarettes. If you need help quitting, ask your doctor.  Wear loose clothes. Do not wear anything tight around your waist.  Raise (elevate) the head of your bed about 6 inches (15 cm).  Try to lower your stress. If you need help doing this, ask your doctor.  If you are overweight, lose an amount of weight that is healthy for you. Ask your doctor about a safe weight loss goal.  Keep all follow-up visits as told by your doctor. This is important. Contact a doctor if:  You have new symptoms.  You lose weight and you do not know why it is happening.  You have trouble swallowing, or it hurts to swallow.  You have wheezing or a cough that keeps happening.  Your symptoms do not get better with treatment.  You have a hoarse voice. Get help right away if:  You have pain in your arms, neck, jaw, teeth, or back.  You feel sweaty, dizzy, or light-headed.  You have chest pain or shortness of breath.  You throw up (vomit) and your throw up looks like blood or coffee grounds.  You pass out (faint).  Your poop (stool) is bloody or black.  You cannot swallow, drink, or eat. This information is not intended to replace advice given to you by your health care provider. Make sure you discuss any questions you have with your health care provider. Document Released: 08/08/2007 Document Revised: 07/28/2015 Document Reviewed: 06/16/2014 Elsevier Interactive Patient Education  [employer].

## 2016-12-04 ENCOUNTER — Ambulatory Visit: Payer: Medicare Other | Admitting: Rehabilitative and Restorative Service Providers"

## 2016-12-07 DIAGNOSIS — E119 Type 2 diabetes mellitus without complications: Secondary | ICD-10-CM | POA: Diagnosis not present

## 2016-12-07 DIAGNOSIS — Z961 Presence of intraocular lens: Secondary | ICD-10-CM | POA: Diagnosis not present

## 2016-12-07 DIAGNOSIS — H01001 Unspecified blepharitis right upper eyelid: Secondary | ICD-10-CM | POA: Diagnosis not present

## 2016-12-07 DIAGNOSIS — D3132 Benign neoplasm of left choroid: Secondary | ICD-10-CM | POA: Diagnosis not present

## 2016-12-07 LAB — HM DIABETES EYE EXAM

## 2016-12-15 ENCOUNTER — Other Ambulatory Visit: Payer: Self-pay | Admitting: Physical Medicine & Rehabilitation

## 2016-12-18 ENCOUNTER — Telehealth: Payer: Self-pay | Admitting: Physical Medicine & Rehabilitation

## 2016-12-18 NOTE — Telephone Encounter (Signed)
Patient's wife is calling to get a refill on Keppra for patient.  He is totally out of this medication.  Please call wife.

## 2016-12-18 NOTE — Telephone Encounter (Signed)
We are not the prescribers.  [facility] prescribed.  April is letting them know to call them.

## 2016-12-20 DIAGNOSIS — Z23 Encounter for immunization: Secondary | ICD-10-CM | POA: Diagnosis not present

## 2017-01-07 ENCOUNTER — Inpatient Hospital Stay (HOSPITAL_COMMUNITY)
Admission: EM | Admit: 2017-01-07 | Discharge: 2017-01-10 | DRG: 101 | Disposition: A | Discharge status: Skilled Nursing Facility | Payer: Medicare Other | Attending: Internal Medicine | Admitting: Internal Medicine

## 2017-01-07 ENCOUNTER — Encounter: Payer: Medicare Other | Attending: Physical Medicine & Rehabilitation

## 2017-01-07 ENCOUNTER — Other Ambulatory Visit: Payer: Self-pay

## 2017-01-07 ENCOUNTER — Emergency Department (HOSPITAL_COMMUNITY): Payer: Medicare Other

## 2017-01-07 ENCOUNTER — Inpatient Hospital Stay (HOSPITAL_COMMUNITY): Payer: Medicare Other

## 2017-01-07 ENCOUNTER — Encounter (HOSPITAL_COMMUNITY): Payer: Self-pay

## 2017-01-07 ENCOUNTER — Encounter: Payer: Self-pay | Admitting: Physical Medicine & Rehabilitation

## 2017-01-07 ENCOUNTER — Ambulatory Visit (HOSPITAL_BASED_OUTPATIENT_CLINIC_OR_DEPARTMENT_OTHER): Payer: Medicare Other | Admitting: Physical Medicine & Rehabilitation

## 2017-01-07 VITALS — BP 136/84 | HR 63

## 2017-01-07 DIAGNOSIS — N182 Chronic kidney disease, stage 2 (mild): Secondary | ICD-10-CM

## 2017-01-07 DIAGNOSIS — I2510 Atherosclerotic heart disease of native coronary artery without angina pectoris: Secondary | ICD-10-CM | POA: Diagnosis present

## 2017-01-07 DIAGNOSIS — R278 Other lack of coordination: Secondary | ICD-10-CM | POA: Diagnosis not present

## 2017-01-07 DIAGNOSIS — F05 Delirium due to known physiological condition: Secondary | ICD-10-CM | POA: Diagnosis present

## 2017-01-07 DIAGNOSIS — R2681 Unsteadiness on feet: Secondary | ICD-10-CM | POA: Diagnosis not present

## 2017-01-07 DIAGNOSIS — Z950 Presence of cardiac pacemaker: Secondary | ICD-10-CM

## 2017-01-07 DIAGNOSIS — F0390 Unspecified dementia without behavioral disturbance: Secondary | ICD-10-CM | POA: Diagnosis not present

## 2017-01-07 DIAGNOSIS — I700 Atherosclerosis of aorta: Secondary | ICD-10-CM | POA: Diagnosis present

## 2017-01-07 DIAGNOSIS — I69393 Ataxia following cerebral infarction: Secondary | ICD-10-CM

## 2017-01-07 DIAGNOSIS — I69359 Hemiplegia and hemiparesis following cerebral infarction affecting unspecified side: Secondary | ICD-10-CM

## 2017-01-07 DIAGNOSIS — I6521 Occlusion and stenosis of right carotid artery: Secondary | ICD-10-CM | POA: Diagnosis present

## 2017-01-07 DIAGNOSIS — M1711 Unilateral primary osteoarthritis, right knee: Secondary | ICD-10-CM | POA: Diagnosis not present

## 2017-01-07 DIAGNOSIS — I69351 Hemiplegia and hemiparesis following cerebral infarction affecting right dominant side: Secondary | ICD-10-CM | POA: Diagnosis not present

## 2017-01-07 DIAGNOSIS — M6281 Muscle weakness (generalized): Secondary | ICD-10-CM | POA: Diagnosis not present

## 2017-01-07 DIAGNOSIS — N400 Enlarged prostate without lower urinary tract symptoms: Secondary | ICD-10-CM | POA: Diagnosis not present

## 2017-01-07 DIAGNOSIS — E1122 Type 2 diabetes mellitus with diabetic chronic kidney disease: Secondary | ICD-10-CM | POA: Diagnosis present

## 2017-01-07 DIAGNOSIS — R270 Ataxia, unspecified: Secondary | ICD-10-CM | POA: Diagnosis not present

## 2017-01-07 DIAGNOSIS — N183 Chronic kidney disease, stage 3 (moderate): Secondary | ICD-10-CM | POA: Diagnosis present

## 2017-01-07 DIAGNOSIS — R569 Unspecified convulsions: Secondary | ICD-10-CM

## 2017-01-07 DIAGNOSIS — I10 Essential (primary) hypertension: Secondary | ICD-10-CM | POA: Diagnosis not present

## 2017-01-07 DIAGNOSIS — R402441 Other coma, without documented Glasgow coma scale score, or with partial score reported, in the field [EMT or ambulance]: Secondary | ICD-10-CM | POA: Diagnosis not present

## 2017-01-07 DIAGNOSIS — E785 Hyperlipidemia, unspecified: Secondary | ICD-10-CM | POA: Diagnosis present

## 2017-01-07 DIAGNOSIS — Z79899 Other long term (current) drug therapy: Secondary | ICD-10-CM

## 2017-01-07 DIAGNOSIS — I480 Paroxysmal atrial fibrillation: Secondary | ICD-10-CM | POA: Diagnosis present

## 2017-01-07 DIAGNOSIS — K219 Gastro-esophageal reflux disease without esophagitis: Secondary | ICD-10-CM | POA: Diagnosis present

## 2017-01-07 DIAGNOSIS — Z66 Do not resuscitate: Secondary | ICD-10-CM | POA: Diagnosis present

## 2017-01-07 DIAGNOSIS — G40909 Epilepsy, unspecified, not intractable, without status epilepticus: Secondary | ICD-10-CM | POA: Diagnosis not present

## 2017-01-07 DIAGNOSIS — I6501 Occlusion and stenosis of right vertebral artery: Secondary | ICD-10-CM | POA: Diagnosis present

## 2017-01-07 DIAGNOSIS — Z87891 Personal history of nicotine dependence: Secondary | ICD-10-CM

## 2017-01-07 DIAGNOSIS — Z7982 Long term (current) use of aspirin: Secondary | ICD-10-CM

## 2017-01-07 DIAGNOSIS — D72829 Elevated white blood cell count, unspecified: Secondary | ICD-10-CM | POA: Diagnosis present

## 2017-01-07 DIAGNOSIS — R41841 Cognitive communication deficit: Secondary | ICD-10-CM | POA: Diagnosis not present

## 2017-01-07 DIAGNOSIS — R269 Unspecified abnormalities of gait and mobility: Secondary | ICD-10-CM | POA: Insufficient documentation

## 2017-01-07 DIAGNOSIS — I69398 Other sequelae of cerebral infarction: Secondary | ICD-10-CM | POA: Diagnosis not present

## 2017-01-07 DIAGNOSIS — I5030 Unspecified diastolic (congestive) heart failure: Secondary | ICD-10-CM | POA: Diagnosis not present

## 2017-01-07 DIAGNOSIS — J449 Chronic obstructive pulmonary disease, unspecified: Secondary | ICD-10-CM | POA: Diagnosis not present

## 2017-01-07 DIAGNOSIS — I639 Cerebral infarction, unspecified: Secondary | ICD-10-CM | POA: Diagnosis not present

## 2017-01-07 DIAGNOSIS — Z85828 Personal history of other malignant neoplasm of skin: Secondary | ICD-10-CM

## 2017-01-07 DIAGNOSIS — E871 Hypo-osmolality and hyponatremia: Secondary | ICD-10-CM | POA: Diagnosis present

## 2017-01-07 DIAGNOSIS — Z7901 Long term (current) use of anticoagulants: Secondary | ICD-10-CM

## 2017-01-07 DIAGNOSIS — R410 Disorientation, unspecified: Secondary | ICD-10-CM | POA: Diagnosis not present

## 2017-01-07 DIAGNOSIS — K5900 Constipation, unspecified: Secondary | ICD-10-CM | POA: Diagnosis not present

## 2017-01-07 DIAGNOSIS — R54 Age-related physical debility: Secondary | ICD-10-CM | POA: Diagnosis not present

## 2017-01-07 DIAGNOSIS — R4182 Altered mental status, unspecified: Secondary | ICD-10-CM | POA: Diagnosis not present

## 2017-01-07 DIAGNOSIS — I129 Hypertensive chronic kidney disease with stage 1 through stage 4 chronic kidney disease, or unspecified chronic kidney disease: Secondary | ICD-10-CM | POA: Diagnosis present

## 2017-01-07 DIAGNOSIS — I69320 Aphasia following cerebral infarction: Secondary | ICD-10-CM | POA: Diagnosis not present

## 2017-01-07 DIAGNOSIS — R4701 Aphasia: Secondary | ICD-10-CM | POA: Diagnosis not present

## 2017-01-07 DIAGNOSIS — I69998 Other sequelae following unspecified cerebrovascular disease: Secondary | ICD-10-CM | POA: Insufficient documentation

## 2017-01-07 DIAGNOSIS — I5189 Other ill-defined heart diseases: Secondary | ICD-10-CM | POA: Diagnosis present

## 2017-01-07 DIAGNOSIS — L8990 Pressure ulcer of unspecified site, unspecified stage: Secondary | ICD-10-CM | POA: Diagnosis present

## 2017-01-07 DIAGNOSIS — I517 Cardiomegaly: Secondary | ICD-10-CM | POA: Diagnosis not present

## 2017-01-07 DIAGNOSIS — R4781 Slurred speech: Secondary | ICD-10-CM | POA: Diagnosis not present

## 2017-01-07 DIAGNOSIS — M1990 Unspecified osteoarthritis, unspecified site: Secondary | ICD-10-CM | POA: Diagnosis present

## 2017-01-07 DIAGNOSIS — Z8673 Personal history of transient ischemic attack (TIA), and cerebral infarction without residual deficits: Secondary | ICD-10-CM | POA: Diagnosis not present

## 2017-01-07 DIAGNOSIS — I63132 Cerebral infarction due to embolism of left carotid artery: Secondary | ICD-10-CM

## 2017-01-07 DIAGNOSIS — G464 Cerebellar stroke syndrome: Secondary | ICD-10-CM | POA: Diagnosis not present

## 2017-01-07 DIAGNOSIS — Z955 Presence of coronary angioplasty implant and graft: Secondary | ICD-10-CM

## 2017-01-07 LAB — DIFFERENTIAL
Basophils Absolute: 0.1 10*3/uL (ref 0.0–0.1)
Basophils Relative: 0 %
Eosinophils Absolute: 0.1 10*3/uL (ref 0.0–0.7)
Eosinophils Relative: 1 %
Lymphocytes Relative: 12 %
Lymphs Abs: 1.7 10*3/uL (ref 0.7–4.0)
Monocytes Absolute: 0.9 10*3/uL (ref 0.1–1.0)
Monocytes Relative: 6 %
Neutro Abs: 11.3 10*3/uL — ABNORMAL HIGH (ref 1.7–7.7)
Neutrophils Relative %: 81 %

## 2017-01-07 LAB — COMPREHENSIVE METABOLIC PANEL
ALT: 23 U/L (ref 17–63)
AST: 43 U/L — ABNORMAL HIGH (ref 15–41)
Albumin: 3.7 g/dL (ref 3.5–5.0)
Alkaline Phosphatase: 86 U/L (ref 38–126)
Anion gap: 13 (ref 5–15)
BUN: 18 mg/dL (ref 6–20)
CO2: 18 mmol/L — ABNORMAL LOW (ref 22–32)
Calcium: 9.3 mg/dL (ref 8.9–10.3)
Chloride: 102 mmol/L (ref 101–111)
Creatinine, Ser: 1.12 mg/dL (ref 0.61–1.24)
GFR calc Af Amer: >60 mL/min (ref >60)
GFR calc non Af Amer: 57 mL/min — ABNORMAL LOW (ref >60)
Glucose, Bld: 116 mg/dL — ABNORMAL HIGH (ref 65–99)
Potassium: 3.8 mmol/L (ref 3.5–5.1)
Sodium: 133 mmol/L — ABNORMAL LOW (ref 135–145)
Total Bilirubin: 0.9 mg/dL (ref 0.3–1.2)
Total Protein: 7 g/dL (ref 6.5–8.1)

## 2017-01-07 LAB — URINALYSIS, ROUTINE W REFLEX MICROSCOPIC
Bilirubin Urine: NEGATIVE
Glucose, UA: NEGATIVE mg/dL
Hgb urine dipstick: NEGATIVE
Ketones, ur: 5 mg/dL — AB
Leukocytes, UA: NEGATIVE
Nitrite: NEGATIVE
Protein, ur: NEGATIVE mg/dL
Specific Gravity, Urine: 1.024 (ref 1.005–1.030)
pH: 8 (ref 5.0–8.0)

## 2017-01-07 LAB — I-STAT CHEM 8, ED
BUN: 21 mg/dL — ABNORMAL HIGH (ref 6–20)
Calcium, Ion: 1.12 mmol/L — ABNORMAL LOW (ref 1.15–1.40)
Chloride: 102 mmol/L (ref 101–111)
Creatinine, Ser: 1 mg/dL (ref 0.61–1.24)
Glucose, Bld: 117 mg/dL — ABNORMAL HIGH (ref 65–99)
HCT: 43 % (ref 39.0–52.0)
Hemoglobin: 14.6 g/dL (ref 13.0–17.0)
Potassium: 3.9 mmol/L (ref 3.5–5.1)
Sodium: 136 mmol/L (ref 135–145)
TCO2: 21 mmol/L — ABNORMAL LOW (ref 22–32)

## 2017-01-07 LAB — I-STAT TROPONIN, ED: Troponin i, poc: 0 ng/mL (ref 0.00–0.08)

## 2017-01-07 LAB — RAPID URINE DRUG SCREEN, HOSP PERFORMED
Amphetamines: NOT DETECTED
Barbiturates: NOT DETECTED
Benzodiazepines: NOT DETECTED
Cocaine: NOT DETECTED
Opiates: NOT DETECTED
Tetrahydrocannabinol: NOT DETECTED

## 2017-01-07 LAB — CBC
HCT: 40.6 % (ref 39.0–52.0)
Hemoglobin: 13.9 g/dL (ref 13.0–17.0)
MCH: 31 pg (ref 26.0–34.0)
MCHC: 34.2 g/dL (ref 30.0–36.0)
MCV: 90.4 fL (ref 78.0–100.0)
Platelets: 381 10*3/uL (ref 150–400)
RBC: 4.49 MIL/uL (ref 4.22–5.81)
RDW: 13.7 % (ref 11.5–15.5)
WBC: 13.9 10*3/uL — ABNORMAL HIGH (ref 4.0–10.5)

## 2017-01-07 LAB — HEMOGLOBIN A1C
Hgb A1c MFr Bld: 5.7 % — ABNORMAL HIGH (ref 4.8–5.6)
Mean Plasma Glucose: 116.89 mg/dL

## 2017-01-07 LAB — LIPID PANEL
Cholesterol: 115 mg/dL (ref 0–200)
HDL: 38 mg/dL — ABNORMAL LOW (ref >40)
LDL Cholesterol: 62 mg/dL (ref 0–99)
Total CHOL/HDL Ratio: 3 RATIO
Triglycerides: 77 mg/dL (ref <150)
VLDL: 15 mg/dL (ref 0–40)

## 2017-01-07 LAB — PROTIME-INR
INR: 1.47
Prothrombin Time: 17.7 seconds — ABNORMAL HIGH (ref 11.4–15.2)

## 2017-01-07 LAB — APTT: aPTT: 37 seconds — ABNORMAL HIGH (ref 24–36)

## 2017-01-07 LAB — CBG MONITORING, ED: Glucose-Capillary: 113 mg/dL — ABNORMAL HIGH (ref 65–99)

## 2017-01-07 MED ORDER — POLYVINYL ALCOHOL 1.4 % OP SOLN
1.0000 [drp] | Freq: Every day | OPHTHALMIC | Status: DC | PRN
  Administered 2017-01-10: 1 [drp] via OPHTHALMIC
  Filled 2017-01-07 (×3): qty 15

## 2017-01-07 MED ORDER — ASPIRIN 325 MG PO TABS
325.0000 mg | ORAL_TABLET | Freq: Every day | ORAL | Status: DC
  Administered 2017-01-07 – 2017-01-08 (×2): 325 mg via ORAL
  Filled 2017-01-07 (×2): qty 1

## 2017-01-07 MED ORDER — ACETAMINOPHEN 650 MG RE SUPP: 650.0000 mg | RECTAL | Status: DC | PRN

## 2017-01-07 MED ORDER — HYDRALAZINE HCL 20 MG/ML IJ SOLN: 5.0000 mg | INTRAMUSCULAR | Status: DC | PRN

## 2017-01-07 MED ORDER — SODIUM CHLORIDE 0.9 % IV SOLN
INTRAVENOUS | Status: AC
  Administered 2017-01-07: 22:00:00 via INTRAVENOUS

## 2017-01-07 MED ORDER — LEVETIRACETAM 500 MG PO TABS
500.0000 mg | ORAL_TABLET | Freq: Two times a day (BID) | ORAL | Status: DC
  Administered 2017-01-07 – 2017-01-08 (×3): 500 mg via ORAL
  Filled 2017-01-07 (×3): qty 1

## 2017-01-07 MED ORDER — QUETIAPINE FUMARATE 25 MG PO TABS
25.0000 mg | ORAL_TABLET | Freq: Every day | ORAL | Status: DC
  Administered 2017-01-07 – 2017-01-08 (×2): 25 mg via ORAL
  Filled 2017-01-07 (×4): qty 1

## 2017-01-07 MED ORDER — STROKE: EARLY STAGES OF RECOVERY BOOK
Freq: Once | Status: DC
  Filled 2017-01-07: qty 1

## 2017-01-07 MED ORDER — QUETIAPINE FUMARATE 25 MG PO TABS
25.0000 mg | ORAL_TABLET | Freq: Once | ORAL | Status: DC
  Administered 2017-01-07: 25 mg via ORAL

## 2017-01-07 MED ORDER — LORAZEPAM 2 MG/ML IJ SOLN
INTRAMUSCULAR | Status: AC
  Administered 2017-01-07: 1 mg
  Filled 2017-01-07: qty 1

## 2017-01-07 MED ORDER — ACETAMINOPHEN 325 MG PO TABS
650.0000 mg | ORAL_TABLET | ORAL | Status: DC | PRN
  Administered 2017-01-07 – 2017-01-10 (×2): 650 mg via ORAL
  Filled 2017-01-07 (×3): qty 2

## 2017-01-07 MED ORDER — IOPAMIDOL (ISOVUE-370) INJECTION 76%
INTRAVENOUS | Status: AC
  Administered 2017-01-07: 50 mL
  Filled 2017-01-07: qty 50

## 2017-01-07 MED ORDER — HALOPERIDOL LACTATE 5 MG/ML IJ SOLN
INTRAMUSCULAR | Status: AC
  Administered 2017-01-07: 5 mg
  Filled 2017-01-07: qty 1

## 2017-01-07 MED ORDER — SENNOSIDES-DOCUSATE SODIUM 8.6-50 MG PO TABS: 1.0000 | ORAL_TABLET | Freq: Every evening | ORAL | Status: DC | PRN

## 2017-01-07 MED ORDER — ACETAMINOPHEN 160 MG/5ML PO SOLN: 650.0000 mg | ORAL | Status: DC | PRN

## 2017-01-07 MED ORDER — ATORVASTATIN CALCIUM 80 MG PO TABS
80.0000 mg | ORAL_TABLET | Freq: Every day | ORAL | Status: DC
  Administered 2017-01-07 – 2017-01-10 (×4): 80 mg via ORAL
  Filled 2017-01-07 (×5): qty 1

## 2017-01-07 NOTE — Progress Notes (Signed)
Knee injection Right   Indication:Right Knee pain due to OA not relieved by medication management and other conservative care.  Informed consent was obtained after describing risks and benefits of the procedure with the patient, this includes bleeding, bruising, infection and medication side effects. The patient wishes to proceed and has given written consent. The patient was placed in a recumbent position. The medial aspect of the knee was marked and prepped with Betadine and alcohol. It was then entered with a 25-gauge 1-1/2 inch needle and a solution containing one ML of 6mg  per mL betamethasone and 3 mL of 1% lidocaine were injected. The patient tolerated the procedure well. Post procedure instructions were given.

## 2017-01-07 NOTE — ED Provider Notes (Signed)
[facility] EMERGENCY DEPARTMENT Provider Note   CSN: [id number] Arrival date & time: 01/07/17  1324   An emergency department physician performed an initial assessment on this suspected stroke patient at 1325.  History   Chief Complaint Chief Complaint  Patient presents with  . Code Stroke    HPI Herbert Marquez is a 83 y.o. male with history of 3 previous strokes with right sided deficits, CAD status post stent, diabetes, hypertension, hyperlipidemia, paroxysmal atrial fibrillation on [place], bilateral carotid artery stenosis presents to ED for evaluation of increased agitation, right arm weakness, slurred and incomprehensible speech noticed around 11:30 AM today by his wife. Since patient has been very agitated and is only oriented to self. No fevers, chills, cough, recent vomiting, diarrhea, head trauma or falls. I evaluated patient after CT scan.  HPI  Past Medical History:  Diagnosis Date  . Arthritis    "pretty much all over"   . Basal cell carcinoma    "several burned off his body, face, head"  . BPH (benign prostatic hypertrophy)   . Coronary artery disease    a. s/p PCI of RCA in 2006  . CVA (cerebral infarction)    a. 06/2015: left thalamic and bilateral PCA  . GERD (gastroesophageal reflux disease)   . Hyperlipidemia   . Hypertension   . Presence of permanent cardiac pacemaker   . Stroke ([place])   . TIA (transient ischemic attack)    Approximately 6 weeks post-cardiac catheterization.     Patient Active Problem List   Diagnosis Date Noted  . Second degree Mobitz II AV block 10/23/2016  . Seizures ([place]) 10/09/2016  . Healthcare maintenance 10/01/2016  . Restlessness and agitation 07/02/2016  . Vascular dementia with behavior disturbance 06/29/2016  . Hemiparesis and other late effects of cerebrovascular accident ([place]) 06/29/2016  . Embolic stroke ([place]) 83/41/9622  . Right hemiparesis ([place])   . History of CVA with residual deficit   .  Chronic anticoagulation   . Atrial fibrillation with rapid ventricular response ([place])   . Seizure prophylaxis   . Diastolic dysfunction   . Bacteremia due to Gram-positive bacteria   . Altered mental status   . Dementia without behavioral disturbance   . Leukocytosis   . Acute blood loss anemia   . Acute encephalopathy 06/04/2016  . PAF (paroxysmal atrial fibrillation) ([place])   . Iron deficiency anemia 04/04/2016  . History of CVA (cerebrovascular accident) 04/04/2016  . Cerebral hemorrhage (HCC) w/ SDH s/p IV tPA   . Coronary artery disease involving native coronary artery of native heart without angina pectoris   . Orthostatic hypotension   . History of right hip replacement   . Acute ischemic stroke ([place]) - L temporal lobe s/p tPA 03/30/2016  . BPH (benign prostatic hyperplasia) 11/25/2015  . GERD (gastroesophageal reflux disease) 11/25/2015  . CKD (chronic kidney disease), stage II 11/25/2015  . Overactive bladder   . Cerebrovascular accident (CVA) ([place]) 09/18/2015  . Gait disturbance, post-stroke 06/23/2015  . 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 Thalamic infarction ([place]) 06/21/2015  . Benign essential HTN   . Type 2 diabetes mellitus with complication, without long-term current use of insulin ([place])   . Dysphagia as late effect of cerebrovascular disease   . Hyponatremia   . HLD (hyperlipidemia)   . COPD (chronic obstructive pulmonary disease) ([place]) 03/09/2011    Past Surgical History:  Procedure Laterality Date  . CARDIOVASCULAR STRESS TEST  07/01/2007   EF 74%  .  CATARACT EXTRACTION, BILATERAL    . CORONARY ANGIOPLASTY WITH STENT PLACEMENT  10/2004   stenting x 2 to RCA  . FRACTURE SURGERY    . HERNIA REPAIR    . INSERT / REPLACE / REMOVE PACEMAKER  10/23/2016  . LAPAROSCOPIC INCISIONAL / UMBILICAL / VENTRAL HERNIA REPAIR     "below his naval"       Home Medications    Prior to Admission medications   Medication Sig Start Date End Date Taking?  Authorizing Provider  apixaban (ELIQUIS) 5 MG TABS tablet Take 1 tablet (5 mg total) by mouth 2 (two) times daily. Resume 10/25/16 evening dose 10/24/16  Yes Seiler, Luetta Nutting K, NP  aspirin EC 81 MG tablet Take 1 tablet (81 mg total) by mouth daily. Patient taking differently: Take 81 mg every evening by mouth.  10/09/16  Yes Rosalin Hawking, MD  atorvastatin (LIPITOR) 80 MG tablet TAKE 1 TABLET BY MOUTH DAILY. 12/03/16  Yes Danford, Valetta Fuller D, NP  levETIRAcetam (KEPPRA) 500 MG tablet Take 1 tablet (500 mg total) by mouth 2 (two) times daily. 08/20/16  Yes Dennie Bible, NP  Multiple Vitamin (MULTIVITAMIN WITH MINERALS) TABS tablet Take 1 tablet by mouth daily.   Yes [provider]  nitroGLYCERIN (NITROSTAT) 0.4 MG SL tablet Place 1 tablet (0.4 mg total) under the tongue every 5 (five) minutes as needed for chest pain. 07/23/16  Yes Nahser, Wonda Cheng, MD  Phenazopyridine HCl (AZO TABS PO) Take 1 tablet 3 (three) times daily by mouth.   Yes [provider]  polyvinyl alcohol (ARTIFICIAL TEARS) 1.4 % ophthalmic solution Place 1 drop into both eyes daily as needed for dry eyes.   Yes [provider]  QUEtiapine (SEROQUEL) 25 MG tablet TAKE 1 TABLET BY MOUTH AT BEDTIME. 11/15/16  Yes Kirsteins, Luanna Salk, MD    Family History Family History  Problem Relation Age of Onset  . Hypertension Mother   . Lung cancer Father   . Lung cancer Brother     Social History Social History   Tobacco Use  . Smoking status: Former Smoker    Last attempt to quit: 03/06/1971    Years since quitting: 45.8  . Smokeless tobacco: Never Used  Substance Use Topics  . Alcohol use: Yes    Alcohol/week: 4.8 oz    Types: 7 Glasses of wine, 1 Cans of beer per week    Comment: 1/2 BEER AND 1 WINE  . Drug use: No     Allergies   Patient has no known allergies.   Review of Systems Review of Systems  Unable to perform ROS: Mental status change  Constitutional: Positive for activity change.  Negative for chills and fever.  Respiratory: Negative for cough and shortness of breath.   Cardiovascular: Negative for leg swelling.  Gastrointestinal: Negative for diarrhea and vomiting.  Neurological: Positive for speech difficulty and weakness.  Psychiatric/Behavioral: Positive for agitation.     Physical Exam Updated Vital Signs BP (!) 148/123   Pulse 74   Temp 98.1 F (36.7 C) (Rectal)   Resp 19   SpO2 97%   Physical Exam  Constitutional: He is oriented to person, place, and time. He appears well-developed and well-nourished. No distress.  Agitated, trying to get off EKG leads. Oriented to self only. Cannot follow commands.  HENT:  Head: Normocephalic and atraumatic.  Right Ear: External ear normal.  Left Ear: External ear normal.  Nose: Nose normal.  Moist because membranes  Eyes: Conjunctivae are normal.  No scleral icterus.  PERRL intact bilaterally. Unable to assess EOMs patient cannot follow commands.  Neck: Normal range of motion. Neck supple.  Cardiovascular: Normal rate, normal heart sounds and intact distal pulses.  Pulmonary/Chest: Effort normal and breath sounds normal. He has no wheezes.  Abdominal: Soft.  No G/R/R  Musculoskeletal: Normal range of motion. He exhibits no deformity.  Neurological: He is alert and oriented to person, place, and time.  A&O to self only. Slurred speech. Cannot follow commands, agitated.  Holding right arm up, rigid.  CN III, IV, VI PEERL intact bilaterally  Skin: Skin is warm and dry. Capillary refill takes less than 2 seconds.  Psychiatric: He has a normal mood and affect. His behavior is normal. Judgment and thought content normal.  Nursing note and vitals reviewed.    ED Treatments / Results  Labs (all labs ordered are listed, but only abnormal results are displayed) Labs Reviewed  PROTIME-INR - Abnormal; Notable for the following components:      Result Value   Prothrombin Time 17.7 (*)    All other components  within normal limits  APTT - Abnormal; Notable for the following components:   aPTT 37 (*)    All other components within normal limits  CBC - Abnormal; Notable for the following components:   WBC 13.9 (*)    All other components within normal limits  DIFFERENTIAL - Abnormal; Notable for the following components:   Neutro Abs 11.3 (*)    All other components within normal limits  COMPREHENSIVE METABOLIC PANEL - Abnormal; Notable for the following components:   Sodium 133 (*)    CO2 18 (*)    Glucose, Bld 116 (*)    AST 43 (*)    GFR calc non Af Amer 57 (*)    All other components within normal limits  CBG MONITORING, ED - Abnormal; Notable for the following components:   Glucose-Capillary 113 (*)    All other components within normal limits  I-STAT CHEM 8, ED - Abnormal; Notable for the following components:   BUN 21 (*)    Glucose, Bld 117 (*)    Calcium, Ion 1.12 (*)    TCO2 21 (*)    All other components within normal limits  URINALYSIS, ROUTINE W REFLEX MICROSCOPIC  RAPID URINE DRUG SCREEN, HOSP PERFORMED  I-STAT TROPONIN, ED    EKG  EKG Interpretation None       Radiology Ct Angio Head W Or Wo Contrast  Result Date: 01/07/2017 CLINICAL DATA:  Aphasia EXAM: CT ANGIOGRAPHY HEAD AND NECK TECHNIQUE: Multidetector CT imaging of the head and neck was performed using the standard protocol during bolus administration of intravenous contrast. Multiplanar CT image reconstructions and MIPs were obtained to evaluate the vascular anatomy. Carotid stenosis measurements (when applicable) are obtained utilizing NASCET criteria, using the distal internal carotid diameter as the denominator. CONTRAST:  50 mL Isovue 370 COMPARISON:  Head CT same date Brain MRI 06/07/2016 FINDINGS: CTA NECK FINDINGS Aortic arch: There is moderate calcific atherosclerosis of the aortic arch. There is no aneurysm, dissection or hemodynamically significant stenosis of the visualized ascending aorta and aortic  arch. Conventional 3 vessel aortic branching pattern. The visualized proximal subclavian arteries are normal. Right carotid system: The right common carotid origin is widely patent. There is no common carotid or internal carotid artery dissection or aneurysm. There is mixed calcified and noncalcified plaque at the right carotid bifurcation the results in 55% stenosis. The right internal carotid artery is widely  patent to the skullbase. Left carotid system: The left common carotid origin is widely patent. There is no common carotid or internal carotid artery dissection or aneurysm. Atherosclerotic calcification at the carotid bifurcation without hemodynamically significant stenosis. Vertebral arteries: The vertebral system is left dominant. Both vertebral artery origins are normal. The right vertebral artery is occluded at the V3 segment, new compared to 06/07/2016. Skeleton: Multilevel moderate to severe facet arthrosis. Grade 1 C7-T1 anterolisthesis Other neck: The nasopharynx is clear. The oropharynx and hypopharynx are normal. The epiglottis is normal. The supraglottic larynx, glottis and subglottic larynx are normal. No retropharyngeal collection. The parapharyngeal spaces are preserved. The parotid and submandibular glands are normal. No sialolithiasis or salivary ductal dilatation. The thyroid gland is normal. There is no cervical lymphadenopathy. Upper chest: Bilateral upper lobe scarring and emphysema. Review of the MIP images confirms the above findings CTA HEAD FINDINGS Anterior circulation: --Intracranial internal carotid arteries: Non stenotic calcific atherosclerosis both internal carotid arteries at the skullbase. --Anterior cerebral arteries: Normal. --Middle cerebral arteries: Normal. --Posterior communicating arteries: Absent bilaterally. Posterior circulation: --Posterior cerebral arteries: Normal. --Superior cerebellar arteries: Normal. --Basilar artery: Normal. --Anterior inferior cerebellar  arteries: Normal. --Posterior inferior cerebellar arteries: Diminished enhancement of the right PICA. Venous sinuses: As permitted by contrast timing, patent. Anatomic variants: None Delayed phase: Not performed. Review of the MIP images confirms the above findings IMPRESSION: 1. 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 2. No intracranial arterial occlusion or high-grade stenosis. 3. 55% stenosis of the proximal right internal carotid artery. 4. Aortic Atherosclerosis (ICD10-I70.0) and Emphysema (ICD10-J43.9). Critical Value/emergent results were called by telephone at the time of interpretation on 01/07/2017 at 2:48 pm to Dr. Johnney Killian, who verbally acknowledged these results. Electronically Signed   By: Ulyses Jarred M.D.   On: 01/07/2017 14:49   Ct Angio Neck W Or Wo Contrast  Result Date: 01/07/2017 CLINICAL DATA:  Aphasia EXAM: CT ANGIOGRAPHY HEAD AND NECK TECHNIQUE: Multidetector CT imaging of the head and neck was performed using the standard protocol during bolus administration of intravenous contrast. Multiplanar CT image reconstructions and MIPs were obtained to evaluate the vascular anatomy. Carotid stenosis measurements (when applicable) are obtained utilizing NASCET criteria, using the distal internal carotid diameter as the denominator. CONTRAST:  50 mL Isovue 370 COMPARISON:  Head CT same date Brain MRI 06/07/2016 FINDINGS: CTA NECK FINDINGS Aortic arch: There is moderate calcific atherosclerosis of the aortic arch. There is no aneurysm, dissection or hemodynamically significant stenosis of the visualized ascending aorta and aortic arch. Conventional 3 vessel aortic branching pattern. The visualized proximal subclavian arteries are normal. Right carotid system: The right common carotid origin is widely patent. There is no common carotid or internal carotid artery dissection or aneurysm. There is mixed calcified and noncalcified plaque  at the right carotid bifurcation the results in 55% stenosis. The right internal carotid artery is widely patent to the skullbase. Left carotid system: The left common carotid origin is widely patent. There is no common carotid or internal carotid artery dissection or aneurysm. Atherosclerotic calcification at the carotid bifurcation without hemodynamically significant stenosis. Vertebral arteries: The vertebral system is left dominant. Both vertebral artery origins are normal. The right vertebral artery is occluded at the V3 segment, new compared to 06/07/2016. Skeleton: Multilevel moderate to severe facet arthrosis. Grade 1 C7-T1 anterolisthesis Other neck: The nasopharynx is clear. The oropharynx and hypopharynx are normal. The epiglottis is normal. The supraglottic larynx, glottis and  subglottic larynx are normal. No retropharyngeal collection. The parapharyngeal spaces are preserved. The parotid and submandibular glands are normal. No sialolithiasis or salivary ductal dilatation. The thyroid gland is normal. There is no cervical lymphadenopathy. Upper chest: Bilateral upper lobe scarring and emphysema. Review of the MIP images confirms the above findings CTA HEAD FINDINGS Anterior circulation: --Intracranial internal carotid arteries: Non stenotic calcific atherosclerosis both internal carotid arteries at the skullbase. --Anterior cerebral arteries: Normal. --Middle cerebral arteries: Normal. --Posterior communicating arteries: Absent bilaterally. Posterior circulation: --Posterior cerebral arteries: Normal. --Superior cerebellar arteries: Normal. --Basilar artery: Normal. --Anterior inferior cerebellar arteries: Normal. --Posterior inferior cerebellar arteries: Diminished enhancement of the right PICA. Venous sinuses: As permitted by contrast timing, patent. Anatomic variants: None Delayed phase: Not performed. Review of the MIP images confirms the above findings IMPRESSION: 1. Occlusion of the right vertebral  artery at the V3 segment, which is a new finding compared to 06/07/2016. 2. No intracranial arterial occlusion or high-grade stenosis. 3. 55% stenosis of the proximal right internal carotid artery. 4. Aortic Atherosclerosis (ICD10-I70.0) and Emphysema (ICD10-J43.9). Critical Value/emergent results were called by telephone at the time of interpretation on 01/07/2017 at 2:48 pm to Dr. Johnney Killian, who verbally acknowledged these results. Electronically Signed   By: Ulyses Jarred M.D.   On: 01/07/2017 14:49   Ct Head Code Stroke Wo Contrast  Result Date: 01/07/2017 CLINICAL DATA:  Code stroke.  Aphasia EXAM: CT HEAD WITHOUT CONTRAST TECHNIQUE: Contiguous axial images were obtained from the base of the skull through the vertex without intravenous contrast. COMPARISON:  Brain MRI 06/07/2016 FINDINGS: The examination is severely degraded by motion. Brain: No acute hemorrhage. There is advanced atrophy with findings of chronic microvascular ischemia. No midline shift or other mass effect. Vascular: Poor visualization due to motion. Skull: No skull fracture. Sinuses/Orbits: No acute finding. Other: None. ASPECTS [facility] Stroke Program Early CT Score) There is too much motion on the examination to provide a reliable ASPECTS rating. IMPRESSION: 1. Severely motion degraded examination, but no visible acute hemorrhage. 2. ASPECTS is not able to be reliably assessed because of the degree of motion. However, there is no large territory infarct or mass effect. Dr. Lorraine Lax paged at 2:00 PM Electronically Signed   By: Ulyses Jarred M.D.   On: 01/07/2017 14:02    Procedures Procedures (including critical care time)  Medications Ordered in ED Medications  LORazepam (ATIVAN) 2 MG/ML injection (1 mg  Given 01/07/17 1340)  haloperidol lactate (HALDOL) 5 MG/ML injection (5 mg  Given 01/07/17 1400)  haloperidol lactate (HALDOL) 5 MG/ML injection (5 mg  Given 01/07/17 1345)  iopamidol (ISOVUE-370) 76 % injection (50 mLs  Contrast  Given 01/07/17 1415)     Initial Impression / Assessment and Plan / ED Course  I have reviewed the triage vital signs and the nursing notes.  Pertinent labs & imaging results that were available during my care of the patient were reviewed by me and considered in my medical decision making (see chart for details).  Clinical Course as of Jan 07 1513  Mon Jan 07, 2017  1509 CT ANGIO NECK W OR WO CONTRAST [CG]  1510 IMPRESSION: 1. 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 2. No intracranial arterial occlusion or high-grade stenosis. 3. 55% stenosis of the proximal right internal carotid artery. 4. Aortic Atherosclerosis (ICD10-I70.0) and Emphysema (ICD10-J43.9). Critical Value/emergent results were called by telephone at the time of interpretation on 01/07/2017 at 2:48 pm  to Dr. Johnney Killian, who verbally acknowledged these results. CT ANGIO HEAD W OR WO CONTRAST [CG]    Clinical Course User Index [CG] Kinnie Feil, PA-C   83 year old male with history of previous strokes with right-sided deficits presents to ED for evaluation of aphasia onset 11:30 AM today. He is only oriented to self and cannot follow commands, slurred speech noted. Reportedly, patient has no difficulty with speech at baseline. CT head showed no acute bleed but there was a lot of movement.  CT angiogram showed occlusion of right vertebral artery which is new from previous. I spoke to Dr. Lorraine Lax who recommends admission, likely no interventions. Discussed plan with family at bedside. Spoke to [facility] hospitalist NP who will admit patient.   Final Clinical Impressions(s) / ED Diagnoses   Final diagnoses:  None    ED Discharge Orders    None       Kinnie Feil, PA-C 01/07/17 1514    Charlesetta Shanks, MD 01/09/17 [phone]

## 2017-01-07 NOTE — ED Notes (Signed)
MRI to occur in morning because of [employer] pacemaker.

## 2017-01-07 NOTE — Patient Instructions (Addendum)
Knee Injection, Care After Refer to this sheet in the next few weeks. These instructions provide you with information about caring for yourself after your procedure. Your health care provider may also give you more specific instructions. Your treatment has been planned according to current medical practices, but problems sometimes occur. Call your health care provider if you have any problems or questions after your procedure. What can I expect after the procedure? After the procedure, it is common to have:  Soreness.  Warmth.  Swelling.  You may have more pain, swelling, and warmth than you did before the injection. This reaction may last for about one day. Follow these instructions at home: Bathing  If you were given a bandage (dressing), keep it dry until your health care provider says it can be removed. Ask your health care provider when you can start showering or taking a bath. Managing pain, stiffness, and swelling  If directed, apply ice to the injection area: ? Put ice in a plastic bag. ? Place a towel between your skin and the bag. ? Leave the ice on for 20 minutes, 2-3 times per day.  Do not apply heat to your knee.  Raise the injection area above the level of your heart while you are sitting or lying down. Activity  Avoid strenuous activities for as long as directed by your health care provider. Ask your health care provider when you can return to your normal activities. General instructions  Take medicines only as directed by your health care provider.  Do not take aspirin or other over-the-counter medicines unless your health care provider says you can.  Check your injection site every day for signs of infection. Watch for: ? Redness, swelling, or pain. ? Fluid, blood, or pus.  Follow your health care provider's instructions about dressing changes and removal. Contact a health care provider if:  You have symptoms at your injection site that last longer than two  days after your procedure.  You have redness, swelling, or pain in your injection area.  You have fluid, blood, or pus coming from your injection site.  You have warmth in your injection area.  You have a fever.  Your pain is not controlled with medicine. Get help right away if:  Your knee turns very red.  Your knee becomes very swollen.  Your knee pain is severe. This information is not intended to replace advice given to you by your health care provider. Make sure you discuss any questions you have with your health care provider. Document Released: 03/12/2014 Document Revised: 10/26/2015 Document Reviewed: 12/30/2013 Elsevier Interactive Patient Education  2018 [employer].   Patient should undergo additional therapy if there is no improvement with ambulation after the knee injection

## 2017-01-07 NOTE — H&P (Signed)
History and Physical    [name] XBM:[id number] DOB: [birth year] DOA: 01/07/2017  PCP: [name], NP Patient coming from: home  Chief Complaint: dysphagia  HPI: Herbert Marquez is a 83 y.o. male with medical history significant for multiple strokes with residual right-sided weakness, hypertension, seizure disorder, diabetes, A. Fib, GERD, diastolic dysfunction, COPD, CAD, dementia, presents to the emergency department with the chief complaint difficulty speaking. Initial evaluation reveals acute ischemic stroke. [facility] are asked to admit.  Information is obtained from the chart and the patient's wife is at the bedside. She states he was in his usual state of health in the doctor's office today and he suddenly had difficulty speaking. She states "I couldn't understand what he was saying and". By the time they got home she had difficulty getting him into the house as "he could not follow my instructions". There are no complaints of headache dizziness syncope or near-syncope. No complaints of chest pain palpitation shortness of breath. No nausea vomiting. No diarrhea dysuria hematuria frequency or urgency. Patient's wife reports he suffers from chronic pain "all over". No fever chills recent travel or sick contacts.  ED Course: In the emergency department is afebrile hemodynamically stable and not hypoxic. He was evaluated by neurology who recommended medicine admission with repeat CT in 24 hours as he has a pacemaker  Review of Systems: As per HPI otherwise all other systems reviewed and are negative.   Ambulatory Status: Patient currently wheelchair-bound  Past Medical History:  Diagnosis Date  . Arthritis    "pretty much all over"   . Basal cell carcinoma    "several burned off his body, face, head"  . BPH (benign prostatic hypertrophy)   . Coronary artery disease    a. s/p PCI of RCA in 2006  . CVA (cerebral infarction)    a. 06/2015: left thalamic and bilateral  PCA  . GERD (gastroesophageal reflux disease)   . Hyperlipidemia   . Hypertension   . Presence of permanent cardiac pacemaker   . Stroke ([place])   . TIA (transient ischemic attack)    Approximately 6 weeks post-cardiac catheterization.     Past Surgical History:  Procedure Laterality Date  . CARDIOVASCULAR STRESS TEST  07/01/2007   EF 74%  . CATARACT EXTRACTION, BILATERAL    . CORONARY ANGIOPLASTY WITH STENT PLACEMENT  10/2004   stenting x 2 to RCA  . FRACTURE SURGERY    . HERNIA REPAIR    . INSERT / REPLACE / REMOVE PACEMAKER  10/23/2016  . LAPAROSCOPIC INCISIONAL / UMBILICAL / VENTRAL HERNIA REPAIR     "below his naval"    Social History   Socioeconomic History  . Marital status: Married    Spouse name: Not on file  . Number of children: Not on file  . Years of education: Not on file  . Highest education level: Not on file  Social Needs  . Financial resource strain: Not on file  . Food insecurity - worry: Not on file  . Food insecurity - inability: Not on file  . Transportation needs - medical: Not on file  . Transportation needs - non-medical: Not on file  Occupational History  . Occupation: Retired    Fish farm manager: OTHER  Tobacco Use  . Smoking status: Former Smoker    Last attempt to quit: 03/06/1971    Years since quitting: 45.8  . Smokeless tobacco: Never Used  Substance and Sexual Activity  . Alcohol use: Yes    Alcohol/week:  4.8 oz    Types: 7 Glasses of wine, 1 Cans of beer per week    Comment: 1/2 BEER AND 1 WINE  . Drug use: No  . Sexual activity: No  Other Topics Concern  . Not on file  Social History Narrative   Lives in [place], [place] with wife. Has 3 children.     No Known Allergies  Family History  Problem Relation Age of Onset  . Hypertension Mother   . Lung cancer Father   . Lung cancer Brother     Prior to Admission medications   Medication Sig Start Date End Date Taking? Authorizing Provider  apixaban (ELIQUIS) 5 MG TABS tablet  Take 1 tablet (5 mg total) by mouth 2 (two) times daily. Resume 10/25/16 evening dose 10/24/16  Yes Seiler, Luetta Nutting K, NP  aspirin EC 81 MG tablet Take 1 tablet (81 mg total) by mouth daily. Patient taking differently: Take 81 mg every evening by mouth.  10/09/16  Yes Rosalin Hawking, MD  atorvastatin (LIPITOR) 80 MG tablet TAKE 1 TABLET BY MOUTH DAILY. 12/03/16  Yes Danford, Valetta Fuller D, NP  levETIRAcetam (KEPPRA) 500 MG tablet Take 1 tablet (500 mg total) by mouth 2 (two) times daily. 08/20/16  Yes Dennie Bible, NP  Multiple Vitamin (MULTIVITAMIN WITH MINERALS) TABS tablet Take 1 tablet by mouth daily.   Yes [provider]  nitroGLYCERIN (NITROSTAT) 0.4 MG SL tablet Place 1 tablet (0.4 mg total) under the tongue every 5 (five) minutes as needed for chest pain. 07/23/16  Yes Nahser, Wonda Cheng, MD  Phenazopyridine HCl (AZO TABS PO) Take 1 tablet 3 (three) times daily by mouth.   Yes [provider]  polyvinyl alcohol (ARTIFICIAL TEARS) 1.4 % ophthalmic solution Place 1 drop into both eyes daily as needed for dry eyes.   Yes [provider]  QUEtiapine (SEROQUEL) 25 MG tablet TAKE 1 TABLET BY MOUTH AT BEDTIME. 11/15/16  Yes Charlett Blake, MD    Physical Exam: Vitals:   01/07/17 1424 01/07/17 1500 01/07/17 1519 01/07/17 1530  BP:  (!) 148/123 137/88 (!) 148/71  Pulse: 82 74 78 76  Resp: 14 19 (!) 25 12  Temp: 98.1 F (36.7 C)     TempSrc: Rectal     SpO2: 97% 97% (!) 88% 95%     General:  Appears slightly fidgity somewhat pale Eyes:  PERRL, EOMI, normal lids, iris ENT:  grossly normal hearing, lips & tongue, mucous membranes of his mouth are dry but pink Neck:  no LAD, masses or thyromegaly Cardiovascular:  Irregularly irregular no m/r/g. No LE edema. Pedal pulses present and palpable Respiratory:  CTA bilaterally, no w/r/r. Normal respiratory effort. Breath sounds distant respirations slightly shallow Abdomen:  soft, ntnd, positive bowel sounds throughout no  guarding or rebounding Skin:  no rash or induration seen on limited exam Musculoskeletal:  grossly normal tone BUE/BLE, good ROM, no bony abnormality Psychiatric:  grossly normal mood and affect, speech fluent and appropriate, AOx3 Neurologic:  Opens eyes to verbal stimuli, unable to follow commands,  Speech garbled. Grip on left 5 out of 5 grip on right 3 out of 5  Labs on Admission: I have personally reviewed following labs and imaging studies  CBC: Recent Labs  Lab 01/07/17 1332 01/07/17 1339  WBC 13.9*  --   NEUTROABS 11.3*  --   HGB 13.9 14.6  HCT 40.6 43.0  MCV 90.4  --   PLT 381  --    Basic  Metabolic Panel: Recent Labs  Lab 01/07/17 1332 01/07/17 1339  NA 133* 136  K 3.8 3.9  CL 102 102  CO2 18*  --   GLUCOSE 116* 117*  BUN 18 21*  CREATININE 1.12 1.00  CALCIUM 9.3  --    GFR: CrCl cannot be calculated (Unknown ideal weight.). Liver Function Tests: Recent Labs  Lab 01/07/17 1332  AST 43*  ALT 23  ALKPHOS 86  BILITOT 0.9  PROT 7.0  ALBUMIN 3.7   No results for input(s): LIPASE, AMYLASE in the last 168 hours. No results for input(s): AMMONIA in the last 168 hours. Coagulation Profile: Recent Labs  Lab 01/07/17 1332  INR 1.47   Cardiac Enzymes: No results for input(s): CKTOTAL, CKMB, CKMBINDEX, TROPONINI in the last 168 hours. BNP (last 3 results) No results for input(s): PROBNP in the last 8760 hours. HbA1C: No results for input(s): HGBA1C in the last 72 hours. CBG: Recent Labs  Lab 01/07/17 1328  GLUCAP 113*   Lipid Profile: No results for input(s): CHOL, HDL, LDLCALC, TRIG, CHOLHDL, LDLDIRECT in the last 72 hours. Thyroid Function Tests: No results for input(s): TSH, T4TOTAL, FREET4, T3FREE, THYROIDAB in the last 72 hours. Anemia Panel: No results for input(s): VITAMINB12, FOLATE, FERRITIN, TIBC, IRON, RETICCTPCT in the last 72 hours. Urine analysis:    Component Value Date/Time   COLORURINE YELLOW 10/23/2016 [place] 10/23/2016 1353   LABSPEC 1.008 10/23/2016 1353   PHURINE 8.0 10/23/2016 1353   GLUCOSEU NEGATIVE 10/23/2016 1353   HGBUR NEGATIVE 10/23/2016 1353   BILIRUBINUR negative 12/03/2016 1421   KETONESUR NEGATIVE 10/23/2016 1353   PROTEINUR 30 12/03/2016 1421   PROTEINUR NEGATIVE 10/23/2016 1353   UROBILINOGEN 0.2 12/03/2016 1421   UROBILINOGEN 0.2 08/31/2011 1033   NITRITE negative 12/03/2016 1421   NITRITE NEGATIVE 10/23/2016 1353   LEUKOCYTESUR Negative 12/03/2016 1421    Creatinine Clearance: CrCl cannot be calculated (Unknown ideal weight.).  Sepsis Labs: @LABRCNTIP (procalcitonin:4,lacticidven:4) )No results found for this or any previous visit (from the past 240 hour(s)).   Radiological Exams on Admission: Ct Angio Head W Or Wo Contrast  Result Date: 01/07/2017 CLINICAL DATA:  Aphasia EXAM: CT ANGIOGRAPHY HEAD AND NECK TECHNIQUE: Multidetector CT imaging of the head and neck was performed using the standard protocol during bolus administration of intravenous contrast. Multiplanar CT image reconstructions and MIPs were obtained to evaluate the vascular anatomy. Carotid stenosis measurements (when applicable) are obtained utilizing NASCET criteria, using the distal internal carotid diameter as the denominator. CONTRAST:  50 mL Isovue 370 COMPARISON:  Head CT same date Brain MRI 06/07/2016 FINDINGS: CTA NECK FINDINGS Aortic arch: There is moderate calcific atherosclerosis of the aortic arch. There is no aneurysm, dissection or hemodynamically significant stenosis of the visualized ascending aorta and aortic arch. Conventional 3 vessel aortic branching pattern. The visualized proximal subclavian arteries are normal. Right carotid system: The right common carotid origin is widely patent. There is no common carotid or internal carotid artery dissection or aneurysm. There is mixed calcified and noncalcified plaque at the right carotid bifurcation the results in 55% stenosis. The right  internal carotid artery is widely patent to the skullbase. Left carotid system: The left common carotid origin is widely patent. There is no common carotid or internal carotid artery dissection or aneurysm. Atherosclerotic calcification at the carotid bifurcation without hemodynamically significant stenosis. Vertebral arteries: The vertebral system is left dominant. Both vertebral artery origins are normal. The right vertebral artery is occluded at the V3 segment,  new compared to 06/07/2016. Skeleton: Multilevel moderate to severe facet arthrosis. Grade 1 C7-T1 anterolisthesis Other neck: The nasopharynx is clear. The oropharynx and hypopharynx are normal. The epiglottis is normal. The supraglottic larynx, glottis and subglottic larynx are normal. No retropharyngeal collection. The parapharyngeal spaces are preserved. The parotid and submandibular glands are normal. No sialolithiasis or salivary ductal dilatation. The thyroid gland is normal. There is no cervical lymphadenopathy. Upper chest: Bilateral upper lobe scarring and emphysema. Review of the MIP images confirms the above findings CTA HEAD FINDINGS Anterior circulation: --Intracranial internal carotid arteries: Non stenotic calcific atherosclerosis both internal carotid arteries at the skullbase. --Anterior cerebral arteries: Normal. --Middle cerebral arteries: Normal. --Posterior communicating arteries: Absent bilaterally. Posterior circulation: --Posterior cerebral arteries: Normal. --Superior cerebellar arteries: Normal. --Basilar artery: Normal. --Anterior inferior cerebellar arteries: Normal. --Posterior inferior cerebellar arteries: Diminished enhancement of the right PICA. Venous sinuses: As permitted by contrast timing, patent. Anatomic variants: None Delayed phase: Not performed. Review of the MIP images confirms the above findings IMPRESSION: 1. 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 2. No intracranial arterial occlusion or high-grade stenosis. 3. 55% stenosis of the proximal right internal carotid artery. 4. Aortic Atherosclerosis (ICD10-I70.0) and Emphysema (ICD10-J43.9). Critical Value/emergent results were called by telephone at the time of interpretation on 01/07/2017 at 2:48 pm to Dr. Johnney Killian, who verbally acknowledged these results. Electronically Signed   By: Ulyses Jarred M.D.   On: 01/07/2017 14:49   Ct Angio Neck W Or Wo Contrast  Result Date: 01/07/2017 CLINICAL DATA:  Aphasia EXAM: CT ANGIOGRAPHY HEAD AND NECK TECHNIQUE: Multidetector CT imaging of the head and neck was performed using the standard protocol during bolus administration of intravenous contrast. Multiplanar CT image reconstructions and MIPs were obtained to evaluate the vascular anatomy. Carotid stenosis measurements (when applicable) are obtained utilizing NASCET criteria, using the distal internal carotid diameter as the denominator. CONTRAST:  50 mL Isovue 370 COMPARISON:  Head CT same date Brain MRI 06/07/2016 FINDINGS: CTA NECK FINDINGS Aortic arch: There is moderate calcific atherosclerosis of the aortic arch. There is no aneurysm, dissection or hemodynamically significant stenosis of the visualized ascending aorta and aortic arch. Conventional 3 vessel aortic branching pattern. The visualized proximal subclavian arteries are normal. Right carotid system: The right common carotid origin is widely patent. There is no common carotid or internal carotid artery dissection or aneurysm. There is mixed calcified and noncalcified plaque at the right carotid bifurcation the results in 55% stenosis. The right internal carotid artery is widely patent to the skullbase. Left carotid system: The left common carotid origin is widely patent. There is no common carotid or internal carotid artery dissection or aneurysm. Atherosclerotic calcification at the carotid bifurcation without hemodynamically significant  stenosis. Vertebral arteries: The vertebral system is left dominant. Both vertebral artery origins are normal. The right vertebral artery is occluded at the V3 segment, new compared to 06/07/2016. Skeleton: Multilevel moderate to severe facet arthrosis. Grade 1 C7-T1 anterolisthesis Other neck: The nasopharynx is clear. The oropharynx and hypopharynx are normal. The epiglottis is normal. The supraglottic larynx, glottis and subglottic larynx are normal. No retropharyngeal collection. The parapharyngeal spaces are preserved. The parotid and submandibular glands are normal. No sialolithiasis or salivary ductal dilatation. The thyroid gland is normal. There is no cervical lymphadenopathy. Upper chest: Bilateral upper lobe scarring and emphysema. Review of the MIP images confirms the above findings CTA HEAD FINDINGS Anterior circulation: --Intracranial internal carotid arteries:  Non stenotic calcific atherosclerosis both internal carotid arteries at the skullbase. --Anterior cerebral arteries: Normal. --Middle cerebral arteries: Normal. --Posterior communicating arteries: Absent bilaterally. Posterior circulation: --Posterior cerebral arteries: Normal. --Superior cerebellar arteries: Normal. --Basilar artery: Normal. --Anterior inferior cerebellar arteries: Normal. --Posterior inferior cerebellar arteries: Diminished enhancement of the right PICA. Venous sinuses: As permitted by contrast timing, patent. Anatomic variants: None Delayed phase: Not performed. Review of the MIP images confirms the above findings IMPRESSION: 1. 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 2. No intracranial arterial occlusion or high-grade stenosis. 3. 55% stenosis of the proximal right internal carotid artery. 4. Aortic Atherosclerosis (ICD10-I70.0) and Emphysema (ICD10-J43.9). Critical Value/emergent results were called by telephone at the time of interpretation on 01/07/2017 at  2:48 pm to Dr. Johnney Killian, who verbally acknowledged these results. Electronically Signed   By: Ulyses Jarred M.D.   On: 01/07/2017 14:49   Ct Head Code Stroke Wo Contrast  Result Date: 01/07/2017 CLINICAL DATA:  Code stroke.  Aphasia EXAM: CT HEAD WITHOUT CONTRAST TECHNIQUE: Contiguous axial images were obtained from the base of the skull through the vertex without intravenous contrast. COMPARISON:  Brain MRI 06/07/2016 FINDINGS: The examination is severely degraded by motion. Brain: No acute hemorrhage. There is advanced atrophy with findings of chronic microvascular ischemia. No midline shift or other mass effect. Vascular: Poor visualization due to motion. Skull: No skull fracture. Sinuses/Orbits: No acute finding. Other: None. ASPECTS [facility] Stroke Program Early CT Score) There is too much motion on the examination to provide a reliable ASPECTS rating. IMPRESSION: 1. Severely motion degraded examination, but no visible acute hemorrhage. 2. ASPECTS is not able to be reliably assessed because of the degree of motion. However, there is no large territory infarct or mass effect. Dr. Lorraine Lax paged at 2:00 PM Electronically Signed   By: Ulyses Jarred M.D.   On: 01/07/2017 14:02    EKG: Independently reviewed. Atrial-paced complexes Ventricular tachycardia, unsustained Prolonged PR interval Left atrial enlargement RBBB and LAFB Artifact in lead(s) I III aVR  Assessment/Plan Principal Problem:   Stroke ([place]) Active Problems:   COPD (chronic obstructive pulmonary disease) ([facility])   HLD (hyperlipidemia)   Benign essential HTN   Ataxia due to recent stroke   Cerebrovascular accident (CVA) ([place])   BPH (benign prostatic hyperplasia)   GERD (gastroesophageal reflux disease)   CKD (chronic kidney disease), stage II   Coronary artery disease involving native coronary artery of native heart without angina pectoris   Dementia without behavioral disturbance   Chronic anticoagulation   Diastolic dysfunction    Hemiparesis and other late effects of cerebrovascular accident ([place])   Seizures ([place])   #1. Stroke. Initial CT of the head showed no acute bleed. CT angio reveals occlusion of right vertebral artery which is new. Evaluated by neurology who opined acute ischemic stroke and recommended medicine admission with repeat CT as patient cannot have an MRI. Risk factors include hyperlipidemia, hypertension, A. Fib -Admit to telemetry -Repeat CT in 24 hours as patient has a pacemaker -2-D echo -Obtain hemoglobin A1c and lipid profile -Frequent neurochecks -Heart healthy diet as he passed bedside swallow eval -Speech therapy and occupational therapy and physical therapy -Continue statin and aspirin per neurology recommendation -Blood pressure goal hypertension up to 937 systolic per neurology -Hold a Eliquis for now per neurology  #2. Hypertension. Blood pressure high end of normal. Home medications include no antihypertensive meds -monitor -prn hydralazine  #3. Diastolic dysfunction. Echo  in April of this year reveals an EF 81% grade 1 diastolic dysfunction -Intake and output -Daily weights -Echo  #4. Chronic kidney disease. Stage III. Creatinine 1.0 on admission. -Gentle IV fluids -Hold nephrotoxins -Monitor urine output  #5. Seizure disorder. Likely related to previous strokes. -Continue home meds  #6. COPD. Not on home oxygen. Appears stable at baseline -Obtain a chest x-ray -Nebs as needed     DVT prophylaxis: scd  Code Status: dnr  Family Communication: wife and son at bedside  Disposition Plan: home or snf  Consults called: aroor neurology Admission status: inpatient    Radene Gunning MD [facility]  If 7PM-7AM, please contact night-coverage [email or web address] Password [email or web address]  01/07/2017, 3:41 PM

## 2017-01-07 NOTE — Consult Note (Signed)
Requesting Physician: Dr. Johnney Killian    Chief Complaint: Stroke  History obtained from:  EMS and wife  HPI:                                                                                                                                         Herbert Marquez is an 83 y.o. male with history of prio CVAs, seizure disorder, hypertension, diabetes, atrial fibrillation, diastolic dysfunction, COPD, CAD, dementia  in the past with residual right sided weakness and only ability to walk short distances. He has no problems talking at baseline. Today he received a injection in the knee and while in the car he suddenly became agitated and and unable to speak. The wife did not notice any staring episode or seizure like activity. On arrival to ED he was severely agitated, aphasic and was SOB and could not lay flat. His O2 saturation was 97% on [place] but 86% on Room air. HE required 1 mg ativan and 10 mg Haldol to sedate patient but he was still very agitated. There was a delay for CTA due to his agitation and inability to not stay still.  Wife states that he had taken his dose of Eliquis.  Date last known well: Date: 01/07/2017 Time last known well: Time: 11:30 tPA Given: No: on Eliquis Modified Rankin: Rankin Score=2   Past Medical History:  Diagnosis Date  . Arthritis    "pretty much all over"   . Basal cell carcinoma    "several burned off his body, face, head"  . BPH (benign prostatic hypertrophy)   . Coronary artery disease    a. s/p PCI of RCA in 2006  . CVA (cerebral infarction)    a. 06/2015: left thalamic and bilateral PCA  . GERD (gastroesophageal reflux disease)   . Hyperlipidemia   . Hypertension   . Presence of permanent cardiac pacemaker   . Stroke ([place])   . TIA (transient ischemic attack)    Approximately 6 weeks post-cardiac catheterization.     Past Surgical History:  Procedure Laterality Date  . CARDIOVASCULAR STRESS TEST  07/01/2007   EF 74%  . CATARACT EXTRACTION, BILATERAL     . CORONARY ANGIOPLASTY WITH STENT PLACEMENT  10/2004   stenting x 2 to RCA  . FRACTURE SURGERY    . HERNIA REPAIR    . INSERT / REPLACE / REMOVE PACEMAKER  10/23/2016  . LAPAROSCOPIC INCISIONAL / UMBILICAL / VENTRAL HERNIA REPAIR     "below his naval"    Family History  Problem Relation Age of Onset  . Hypertension Mother   . Lung cancer Father   . Lung cancer Brother    Social History:  reports that he quit smoking about 45 years ago. he has never used smokeless tobacco. He reports that he drinks about 4.8 oz of alcohol per week. He reports that he does not use drugs.  Allergies: No Known Allergies  Medications:                                                                                                                           Current Facility-Administered Medications  Medication Dose Route Frequency Provider Last Rate Last Dose  . iopamidol (ISOVUE-370) 76 % injection            Current Outpatient Medications  Medication Sig Dispense Refill  . apixaban (ELIQUIS) 5 MG TABS tablet Take 1 tablet (5 mg total) by mouth 2 (two) times daily. Resume 10/25/16 evening dose 60 tablet 1  . aspirin EC 81 MG tablet Take 1 tablet (81 mg total) by mouth daily.    [name] atorvastatin (LIPITOR) 80 MG tablet TAKE 1 TABLET BY MOUTH DAILY. 90 tablet 0  . levETIRAcetam (KEPPRA) 500 MG tablet Take 1 tablet (500 mg total) by mouth 2 (two) times daily. 180 tablet 1  . Multiple Vitamin (MULTIVITAMIN WITH MINERALS) TABS tablet Take 1 tablet by mouth daily.    . nitroGLYCERIN (NITROSTAT) 0.4 MG SL tablet Place 1 tablet (0.4 mg total) under the tongue every 5 (five) minutes as needed for chest pain. 25 tablet 5  . omeprazole (PRILOSEC) 20 MG capsule Take 20 mg by mouth daily.    . polyvinyl alcohol (ARTIFICIAL TEARS) 1.4 % ophthalmic solution Place 1 drop into both eyes daily as needed for dry eyes.    [name] QUEtiapine (SEROQUEL) 25 MG tablet TAKE 1 TABLET BY MOUTH AT BEDTIME. 30 tablet 2     ROS:                                                                                                                                        History obtained from unobtainable from patient due to mental status    Neurologic Examination:  There were no vitals taken for this visit.  HEENT-  Normocephalic, no lesions, without obvious abnormality.  Normal external eye and conjunctiva.  Normal TM's bilaterally.  Normal auditory canals and external ears. Normal external nose, mucus membranes and septum.  Normal pharynx. Cardiovascular- S1, S2 normal, pulses palpable throughout   Lungs- chest clear, no wheezing, rales, normal symmetric air entry Abdomen- normal findings: bowel sounds normal Extremities- no edema Lymph-no adenopathy palpable Musculoskeletal-no joint tenderness, deformity or swelling Skin-warm and dry, no hyperpigmentation, vitiligo, or suspicious lesions  Neurological Examination Mental Status: Awake but severely agitated, not oriented globally aphasic with garbaled speech.  Not following commands Cranial Nerves: II: Unable to assess III,IV, VI: ptosis not present, extra-ocular motions intact bilaterally, pupils equal, round, reactive to light and accommodation V,VII: smile symmetric, facial light touch sensation normal bilaterally VIII: hearing normal bilaterally IX,X: uvula rises symmetrically XI: bilateral shoulder shrug XII: midline tongue extension Motor: He has residual right hemiparesis but able to lift antigravity and 5/5 strength on the left. Some contraction in the right arm Sensory: Pinprick and light touch intact throughout, bilaterally Deep Tendon Reflexes: 2+ and symmetric throughout Plantars: Right: downgoing   Left: downgoing Cerebellar: Unable to test Gait: not teested       Lab Results: Basic Metabolic Panel: Recent Labs  Lab 01/07/17 1339  NA 136  K 3.9  CL  102  GLUCOSE 117*  BUN 21*  CREATININE 1.00    Liver Function Tests: No results for input(s): AST, ALT, ALKPHOS, BILITOT, PROT, ALBUMIN in the last 168 hours. No results for input(s): LIPASE, AMYLASE in the last 168 hours. No results for input(s): AMMONIA in the last 168 hours.  CBC: Recent Labs  Lab 01/07/17 1332 01/07/17 1339  WBC 13.9*  --   NEUTROABS 11.3*  --   HGB 13.9 14.6  HCT 40.6 43.0  MCV 90.4  --   PLT 381  --     Cardiac Enzymes: No results for input(s): CKTOTAL, CKMB, CKMBINDEX, TROPONINI in the last 168 hours.  Lipid Panel: No results for input(s): CHOL, TRIG, HDL, CHOLHDL, VLDL, LDLCALC in the last 168 hours.  CBG: Recent Labs  Lab 01/07/17 1328  GLUCAP 113*    Microbiology: Results for orders placed or performed during the hospital encounter of 06/04/16  Blood Culture (routine x 2)     Status: Abnormal   Collection Time: 06/04/16  4:30 PM  Result Value Ref Range Status   Specimen Description BLOOD RIGHT ANTECUBITAL  Final   Special Requests   Final    BOTTLES DRAWN AEROBIC ONLY Blood Culture adequate volume   Culture  Setup Time   Final    GRAM POSITIVE COCCI IN CLUSTERS AEROBIC BOTTLE ONLY CRITICAL VALUE NOTED.  VALUE IS CONSISTENT WITH PREVIOUSLY REPORTED AND CALLED VALUE.    Culture (A)  Final    MICROCOCCUS LUTEUS/LYLAE Standardized susceptibility testing for this organism is not available.    Report Status 06/07/2016 FINAL  Final  Blood Culture (routine x 2)     Status: Abnormal   Collection Time: 06/04/16  4:35 PM  Result Value Ref Range Status   Specimen Description BLOOD LEFT FOREARM  Final   Special Requests   Final    BOTTLES DRAWN AEROBIC ONLY Blood Culture adequate volume   Culture  Setup Time   Final    GRAM POSITIVE COCCI IN CLUSTERS AEROBIC BOTTLE ONLY CRITICAL RESULT CALLED TO, READ BACK BY AND VERIFIED WITH: P. Dang Pharm.D. 16:30 06/05/16 ([email or web address])  Culture (A)  Final    STAPHYLOCOCCUS EPIDERMIDIS THE SIGNIFICANCE  OF ISOLATING THIS ORGANISM FROM A SINGLE SET OF BLOOD CULTURES WHEN MULTIPLE SETS ARE DRAWN IS UNCERTAIN. PLEASE NOTIFY THE MICROBIOLOGY DEPARTMENT WITHIN ONE WEEK IF SPECIATION AND SENSITIVITIES ARE REQUIRED.    Report Status 06/07/2016 FINAL  Final  Blood Culture ID Panel (Reflexed)     Status: Abnormal   Collection Time: 06/04/16  4:35 PM  Result Value Ref Range Status   Enterococcus species NOT DETECTED NOT DETECTED Final   Listeria monocytogenes NOT DETECTED NOT DETECTED Final   Staphylococcus species DETECTED (A) NOT DETECTED Final    Comment: Methicillin (oxacillin) resistant coagulase negative staphylococcus. Possible blood culture contaminant (unless isolated from more than one blood culture draw or clinical case suggests pathogenicity). No antibiotic treatment is indicated for blood  culture contaminants. CRITICAL RESULT CALLED TO, READ BACK BY AND VERIFIED WITH: P. Dang Pharm.D. 16:30 06/05/16 ([email or web address])    Staphylococcus aureus NOT DETECTED NOT DETECTED Final   Methicillin resistance DETECTED (A) NOT DETECTED Final    Comment: CRITICAL RESULT CALLED TO, READ BACK BY AND VERIFIED WITH: P. Dang Pharm.D. 16:30 06/05/16 ([email or web address])    Streptococcus species NOT DETECTED NOT DETECTED Final   Streptococcus agalactiae NOT DETECTED NOT DETECTED Final   Streptococcus pneumoniae NOT DETECTED NOT DETECTED Final   Streptococcus pyogenes NOT DETECTED NOT DETECTED Final   Acinetobacter baumannii NOT DETECTED NOT DETECTED Final   Enterobacteriaceae species NOT DETECTED NOT DETECTED Final   Enterobacter cloacae complex NOT DETECTED NOT DETECTED Final   Escherichia coli NOT DETECTED NOT DETECTED Final   Klebsiella oxytoca NOT DETECTED NOT DETECTED Final   Klebsiella pneumoniae NOT DETECTED NOT DETECTED Final   Proteus species NOT DETECTED NOT DETECTED Final   Serratia marcescens NOT DETECTED NOT DETECTED Final   Haemophilus influenzae NOT DETECTED NOT DETECTED Final   Neisseria meningitidis NOT  DETECTED NOT DETECTED Final   Pseudomonas aeruginosa NOT DETECTED NOT DETECTED Final   Candida albicans NOT DETECTED NOT DETECTED Final   Candida glabrata NOT DETECTED NOT DETECTED Final   Candida krusei NOT DETECTED NOT DETECTED Final   Candida parapsilosis NOT DETECTED NOT DETECTED Final   Candida tropicalis NOT DETECTED NOT DETECTED Final  Respiratory Panel by PCR     Status: None   Collection Time: 06/05/16 12:10 PM  Result Value Ref Range Status   Adenovirus NOT DETECTED NOT DETECTED Final   Coronavirus 229E NOT DETECTED NOT DETECTED Final   Coronavirus HKU1 NOT DETECTED NOT DETECTED Final   Coronavirus NL63 NOT DETECTED NOT DETECTED Final   Coronavirus OC43 NOT DETECTED NOT DETECTED Final   Metapneumovirus NOT DETECTED NOT DETECTED Final   Rhinovirus / Enterovirus NOT DETECTED NOT DETECTED Final   Influenza A NOT DETECTED NOT DETECTED Final   Influenza B NOT DETECTED NOT DETECTED Final   Parainfluenza Virus 1 NOT DETECTED NOT DETECTED Final   Parainfluenza Virus 2 NOT DETECTED NOT DETECTED Final   Parainfluenza Virus 3 NOT DETECTED NOT DETECTED Final   Parainfluenza Virus 4 NOT DETECTED NOT DETECTED Final   Respiratory Syncytial Virus NOT DETECTED NOT DETECTED Final   Bordetella pertussis NOT DETECTED NOT DETECTED Final   Chlamydophila pneumoniae NOT DETECTED NOT DETECTED Final   Mycoplasma pneumoniae NOT DETECTED NOT DETECTED Final  Culture, blood (routine x 2)     Status: None   Collection Time: 06/05/16  7:04 PM  Result Value Ref Range Status   Specimen Description BLOOD LEFT ANTECUBITAL  Final  Special Requests   Final    BOTTLES DRAWN AEROBIC AND ANAEROBIC Blood Culture results may not be optimal due to an excessive volume of blood received in culture bottles   Culture NO GROWTH 5 DAYS  Final   Report Status 06/10/2016 FINAL  Final  Culture, blood (routine x 2)     Status: None   Collection Time: 06/05/16  7:06 PM  Result Value Ref Range Status   Specimen Description  BLOOD LEFT HAND  Final   Special Requests   Final    BOTTLES DRAWN AEROBIC ONLY Blood Culture results may not be optimal due to an excessive volume of blood received in culture bottles   Culture NO GROWTH 5 DAYS  Final   Report Status 06/10/2016 FINAL  Final    Coagulation Studies: Recent Labs    01/07/17 1332  LABPROT 17.7*  INR 1.47    Imaging: No results found.     Assessment and plan discussed with with attending physician and they are in agreement.    Etta Quill PA-C [facility] [phone]  01/07/2017, 1:59 PM   Assessment: 83 y.o. male old CVA, Afib on Eliquis presenting to ED with global aphasia and SOB. CT head showed no acute bleed but had lots of movement. Patient was not a tPA candidate due to [facility] and not a candidate for IR as he did not have LVO  Acute Ischemic Stroke   Stroke Risk Factors - hyperlipidemia and hypertension, likely Afib   Recommend #Repeat CT head in 24 hrs ( no MRI 2/2 pacemaker) #Transthoracic Echo  #[place] ASA 81 mg   hold Eliquis until repeat CT to timing of restarting AC will depend  on size of stroke  #Start or continue Atorvastatin 80 mg/other high intensity statin # BP goal: permissive HTN upto 226 systolic, PRNs above 333 # HBAIC and Lipid profile # Telemetry monitoring # Frequent neuro checks # NPO until passes stroke swallow screen  Please page stroke NP  Or  PA  Or MD from 8am -4 pm  as this patient from this time will be  followed by the stroke.   You can look them up on [email or web address]  Password [email or web address]

## 2017-01-07 NOTE — ED Notes (Signed)
Unable to obtain bedside CXR due to constant movement of patient.  Hospitalist paged at this time

## 2017-01-07 NOTE — ED Notes (Signed)
Admitting provider at bedside for evaluation.

## 2017-01-07 NOTE — ED Triage Notes (Signed)
Pt arrives EMs from home where pt developed sudden onset of right arm weakness, slurred speech, aphasia. Pt was code stroke alert. Pt awake, not following commands, agitated on arrival to CT. Pt has hx of seizure, old right sided stroke. Had right knee cortisone shot in office approx 1030 today. VSS

## 2017-01-07 NOTE — ED Notes (Signed)
Interrogation of [place] pacemaker performed per this RN at this time.

## 2017-01-07 NOTE — Code Documentation (Signed)
83yo male arriving to [facility] via [place] at 58.  Patient was not feeling well this morning per wife.  She took him to receive a cortisone injection in his right knee and on the way home in the car developed garbled speech and agitation.  Wife reports patient was not understanding directions and asking multiple questions.  EMS called and activated a code stroke.  Patient with h/o stroke with residual right sided deficits.  Patient able to ambulate with a walker and complete most ADLs.  Patient on Apixaban with last dose this morning.  Stroke team to the bedside on patient arrival.  Labs drawn and patient cleared for CT by Dr. Colvin Caroli.  Patient to CT with team.  CT completed followed by CTA head and neck.  Patient agitated and required Ativan and Haldol IVP to complete imaging despite multiple attempts to redirect.  NIHSS 11, see documentation for details and code stroke times.  Patient with mild right facial droop and right hemiparesis and global aphasia on exam.  Patient is contraindicated for tPA d/t taking apixaban.  No acute stroke treatment at this time.  Patient to be admitted.  Bedside handoff with ED RN Lexine Baton.

## 2017-01-08 ENCOUNTER — Inpatient Hospital Stay (HOSPITAL_COMMUNITY): Payer: Medicare Other

## 2017-01-08 DIAGNOSIS — L8990 Pressure ulcer of unspecified site, unspecified stage: Secondary | ICD-10-CM

## 2017-01-08 DIAGNOSIS — Z8673 Personal history of transient ischemic attack (TIA), and cerebral infarction without residual deficits: Secondary | ICD-10-CM

## 2017-01-08 DIAGNOSIS — R4182 Altered mental status, unspecified: Secondary | ICD-10-CM

## 2017-01-08 DIAGNOSIS — R569 Unspecified convulsions: Secondary | ICD-10-CM

## 2017-01-08 DIAGNOSIS — I5030 Unspecified diastolic (congestive) heart failure: Secondary | ICD-10-CM

## 2017-01-08 LAB — ECHOCARDIOGRAM COMPLETE
Height: 66 in
Weight: 2620.8 oz

## 2017-01-08 LAB — URINALYSIS, ROUTINE W REFLEX MICROSCOPIC
Bilirubin Urine: NEGATIVE
Glucose, UA: NEGATIVE mg/dL
Ketones, ur: NEGATIVE mg/dL
Leukocytes, UA: NEGATIVE
Nitrite: NEGATIVE
Protein, ur: NEGATIVE mg/dL
Specific Gravity, Urine: 1.016 (ref 1.005–1.030)
pH: 6 (ref 5.0–8.0)

## 2017-01-08 MED ORDER — ALPRAZOLAM 0.5 MG PO TABS: 0.5000 mg | ORAL_TABLET | Freq: Once | ORAL | Status: DC

## 2017-01-08 MED ORDER — ALPRAZOLAM 0.5 MG PO TABS
0.5000 mg | ORAL_TABLET | Freq: Once | ORAL | Status: AC
  Administered 2017-01-08: 0.5 mg via ORAL
  Filled 2017-01-08: qty 1

## 2017-01-08 MED ORDER — LORAZEPAM 2 MG/ML IJ SOLN
0.2500 mg | Freq: Once | INTRAMUSCULAR | Status: AC
  Administered 2017-01-08: 0.25 mg via INTRAVENOUS
  Filled 2017-01-08: qty 1

## 2017-01-08 MED ORDER — LORAZEPAM 2 MG/ML IJ SOLN: 0.2500 mg | Freq: Once | INTRAMUSCULAR | Status: DC

## 2017-01-08 MED ORDER — ALPRAZOLAM 0.5 MG PO TABS: 0.5000 mg | ORAL_TABLET | Freq: Once | ORAL | Status: AC | PRN

## 2017-01-08 MED ORDER — APIXABAN 5 MG PO TABS
5.0000 mg | ORAL_TABLET | Freq: Two times a day (BID) | ORAL | Status: DC
  Administered 2017-01-08 – 2017-01-10 (×6): 5 mg via ORAL
  Filled 2017-01-08 (×6): qty 1

## 2017-01-08 MED ORDER — ASPIRIN EC 81 MG PO TBEC
81.0000 mg | DELAYED_RELEASE_TABLET | Freq: Every day | ORAL | Status: DC
  Administered 2017-01-09 – 2017-01-10 (×2): 81 mg via ORAL
  Filled 2017-01-08 (×2): qty 1

## 2017-01-08 MED ORDER — ASPIRIN EC 81 MG PO TBEC: 81.0000 mg | DELAYED_RELEASE_TABLET | Freq: Every day | ORAL | Status: DC

## 2017-01-08 MED ORDER — LORAZEPAM 2 MG/ML IJ SOLN
INTRAMUSCULAR | Status: AC
  Filled 2017-01-08: qty 1

## 2017-01-08 MED ORDER — LEVETIRACETAM 750 MG PO TABS
750.0000 mg | ORAL_TABLET | Freq: Two times a day (BID) | ORAL | Status: DC
  Administered 2017-01-08 – 2017-01-10 (×5): 750 mg via ORAL
  Filled 2017-01-08 (×5): qty 1

## 2017-01-08 NOTE — Progress Notes (Signed)
PT Cancellation Note  Patient Details Name: ZYSHONNE MALECHA MRN: [id number] DOB: [birth year]   Cancelled Treatment:    Reason Eval/Treat Not Completed: Patient at procedure or test/unavailable Pt having Echo done. PT will follow back this afternoon as able. Thanks,  Dani Gobble. Migdalia Dk PT, DPT Acute Rehabilitation  [phone] Pager [phone]  [place] 01/08/2017, 10:01 AM

## 2017-01-08 NOTE — Procedures (Signed)
ELECTROENCEPHALOGRAM REPORT  Date of Study: 01/08/2017  Patient's Name: Herbert Marquez MRN: [id number] Date of Birth: 06-01-1927  Referring Provider: Dr. Rosalin Hawking  Clinical History: This is an 83 year old man with altered mental status.  Medications: levETIRAcetam (KEPPRA) tablet 500 mg  acetaminophen (TYLENOL) tablet 650 mg  aspirin tablet 325 mg  atorvastatin (LIPITOR) tablet 80 mg  hydrALAZINE (APRESOLINE) injection 5 mg  LORazepam (ATIVAN) 2 MG/ML injection  LORazepam (ATIVAN) injection 0.25 mg  polyvinyl alcohol (LIQUIFILM TEARS) 1.4 % ophthalmic solution 1 drop  QUEtiapine (SEROQUEL) tablet 25 mg  senna-docusate (Senokot-S) tablet 1 tablet   Technical Summary: A multichannel digital EEG recording measured by the international 10-20 system with electrodes applied with paste and impedances below 5000 ohms performed as portable with EKG monitoring in an awake and asleep patient.  Hyperventilation and photic stimulation were not performed.  The digital EEG was referentially recorded, reformatted, and digitally filtered in a variety of bipolar and referential montages for optimal display.   Description: The patient is awake and asleep during the recording.  During maximal wakefulness, there is a symmetric, medium voltage 7 Hz posterior dominant rhythm that poorly attenuates with eye opening and eye closure. This is admixed with a moderate amount of diffuse 4-5 Hz theta and occasional 2-3 Hz delta slowing of the background.  During drowsiness and sleep, there is an increase in theta and delta slowing of the background. Deeper stages of sleep were not seen. Hyperventilation and photic stimulation were not performed. There were no epileptiform discharges or electrographic seizures seen.    EKG lead was unremarkable.  Impression: This awake and asleep EEG is abnormal due to moderate diffuse slowing of the waking background.  Clinical Correlation of the above findings indicates  diffuse cerebral dysfunction that is non-specific in etiology and can be seen with hypoxic/ischemic injury, toxic/metabolic encephalopathies, neurodegenerative disorders, or medication effect.  The absence of epileptiform discharges does not rule out a clinical diagnosis of epilepsy.  Clinical correlation is advised.   Ellouise Newer, M.D.

## 2017-01-08 NOTE — Progress Notes (Signed)
PROGRESS NOTE    Herbert Marquez  UKG:[id number] DOB: 1927-11-21 DOA: 01/07/2017 PCP: [name], NP    Brief Narrative: HPI: Herbert Marquez is a 83 y.o. male with medical history significant for multiple strokes with residual right-sided weakness, hypertension, seizure disorder, diabetes, A. Fib, GERD, diastolic dysfunction, COPD, CAD, dementia, presents to the emergency department with the chief complaint difficulty speaking. Initial evaluation reveals acute ischemic stroke. [facility] are asked to admit.  Information is obtained from the chart and the patient's wife is at the bedside. She states he was in his usual state of health in the doctor's office today and he suddenly had difficulty speaking. She states "I couldn't understand what he was saying and". By the time they got home she had difficulty getting him into the house as "he could not follow my instructions". There are no complaints of headache dizziness syncope or near-syncope. No complaints of chest pain palpitation shortness of breath. No nausea vomiting. No diarrhea dysuria hematuria frequency or urgency. Patient's wife reports he suffers from chronic pain "all over". No fever chills recent travel or sick contacts.  ED Course: In the emergency department is afebrile hemodynamically stable and not hypoxic. He was evaluated by neurology who recommended medicine admission with repeat CT in 24 hours as he has a pacemaker     Assessment & Plan:   Principal Problem:   Stroke [facility]) Active Problems:   COPD (chronic obstructive pulmonary disease) ([place])   HLD (hyperlipidemia)   Benign essential HTN   Ataxia due to recent stroke   Cerebrovascular accident (CVA) ([place])   BPH (benign prostatic hyperplasia)   GERD (gastroesophageal reflux disease)   CKD (chronic kidney disease), stage II   Coronary artery disease involving native coronary artery of native heart without angina pectoris   Dementia without behavioral  disturbance   Chronic anticoagulation   Diastolic dysfunction   Hemiparesis and other late effects of cerebrovascular accident ([place])   Seizures ([place])   Pressure injury of skin  Seizure episode, less likely TIA;  Neurology think episode was related to seizure.  Keppra increase to 750 mg BID>  Continue with eliquis.  EEG  ECHO  LDL-62  HgbA1c5.7 Awaiting therapy evaluation.  Still confuse, complaints of dysuria, will check UA and urine culture.    HTN; BP controlled.   Chronic diastolic Dysfunction; appears compensated.   CKD stage III; monitor.   Seizure; increase keppra.   COPD; stable.   Dementia; continue with Seroquel.   Leukocytosis;  Received steroid injection.  Follow trend   DVT prophylaxis: Eliquis  Code Status: DNR Family Communication:  Disposition Plan: to be determine.   Consultants:   Neurology    Procedures:   EEG;    Antimicrobials:     Subjective: He is alert, speech better, but still confuse.  Wife report confusion after steroid injection   Objective: Vitals:   01/07/17 2320 01/08/17 0200 01/08/17 0500 01/08/17 1057  BP: 106/70 101/68 112/76 105/61  Pulse: 82 80 69 70  Resp: 18 (!) 22 18 18   Temp: 98.6 F (37 C) 97.7 F (36.5 C) 98 F (36.7 C) (!) 97.5 F (36.4 C)  TempSrc: Oral Axillary Oral Oral  SpO2: 97% 97% 97% 94%  Weight:      Height:        Intake/Output Summary (Last 24 hours) at 01/08/2017 1602 Last data filed at 01/08/2017 0520 Gross per 24 hour  Intake 307.8 ml  Output 800 ml  Net -492.2 ml  Filed Weights   01/07/17 2102  Weight: 74.3 kg (163 lb 12.8 oz)    Examination:  General exam: Appears calm and comfortable  Respiratory system: Clear to auscultation. Respiratory effort normal. Cardiovascular system: S1 & S2 heard, RRR. No JVD, murmurs, rubs, gallops or clicks. No pedal edema. Gastrointestinal system: Abdomen is nondistended, soft and nontender. No organomegaly or masses felt. Normal  bowel sounds heard. Central nervous system: Alert .  No focal neurological deficits. Extremities: Symmetric 5 x 5 power.      Data Reviewed: I have personally reviewed following labs and imaging studies  CBC: Recent Labs  Lab 01/07/17 1332 01/07/17 1339  WBC 13.9*  --   NEUTROABS 11.3*  --   HGB 13.9 14.6  HCT 40.6 43.0  MCV 90.4  --   PLT 381  --    Basic Metabolic Panel: Recent Labs  Lab 01/07/17 1332 01/07/17 1339  NA 133* 136  K 3.8 3.9  CL 102 102  CO2 18*  --   GLUCOSE 116* 117*  BUN 18 21*  CREATININE 1.12 1.00  CALCIUM 9.3  --    GFR: Estimated Creatinine Clearance: 47 mL/min (by C-G formula based on SCr of 1 mg/dL). Liver Function Tests: Recent Labs  Lab 01/07/17 1332  AST 43*  ALT 23  ALKPHOS 86  BILITOT 0.9  PROT 7.0  ALBUMIN 3.7   No results for input(s): LIPASE, AMYLASE in the last 168 hours. No results for input(s): AMMONIA in the last 168 hours. Coagulation Profile: Recent Labs  Lab 01/07/17 1332  INR 1.47   Cardiac Enzymes: No results for input(s): CKTOTAL, CKMB, CKMBINDEX, TROPONINI in the last 168 hours. BNP (last 3 results) No results for input(s): PROBNP in the last 8760 hours. HbA1C: Recent Labs    01/07/17 1332  HGBA1C 5.7*   CBG: Recent Labs  Lab 01/07/17 1328  GLUCAP 113*   Lipid Profile: Recent Labs    01/07/17 1332  CHOL 115  HDL 38*  LDLCALC 62  TRIG 77  CHOLHDL 3.0   Thyroid Function Tests: No results for input(s): TSH, T4TOTAL, FREET4, T3FREE, THYROIDAB in the last 72 hours. Anemia Panel: No results for input(s): VITAMINB12, FOLATE, FERRITIN, TIBC, IRON, RETICCTPCT in the last 72 hours. Sepsis Labs: No results for input(s): PROCALCITON, LATICACIDVEN in the last 168 hours.  No results found for this or any previous visit (from the past 240 hour(s)).       Radiology Studies: Ct Angio Head W Or Wo Contrast  Result Date: 01/07/2017 CLINICAL DATA:  Aphasia EXAM: CT ANGIOGRAPHY HEAD AND NECK  TECHNIQUE: Multidetector CT imaging of the head and neck was performed using the standard protocol during bolus administration of intravenous contrast. Multiplanar CT image reconstructions and MIPs were obtained to evaluate the vascular anatomy. Carotid stenosis measurements (when applicable) are obtained utilizing NASCET criteria, using the distal internal carotid diameter as the denominator. CONTRAST:  50 mL Isovue 370 COMPARISON:  Head CT same date Brain MRI 06/07/2016 FINDINGS: CTA NECK FINDINGS Aortic arch: There is moderate calcific atherosclerosis of the aortic arch. There is no aneurysm, dissection or hemodynamically significant stenosis of the visualized ascending aorta and aortic arch. Conventional 3 vessel aortic branching pattern. The visualized proximal subclavian arteries are normal. Right carotid system: The right common carotid origin is widely patent. There is no common carotid or internal carotid artery dissection or aneurysm. There is mixed calcified and noncalcified plaque at the right carotid bifurcation the results in 55% stenosis. The right internal  carotid artery is widely patent to the skullbase. Left carotid system: The left common carotid origin is widely patent. There is no common carotid or internal carotid artery dissection or aneurysm. Atherosclerotic calcification at the carotid bifurcation without hemodynamically significant stenosis. Vertebral arteries: The vertebral system is left dominant. Both vertebral artery origins are normal. The right vertebral artery is occluded at the V3 segment, new compared to 06/07/2016. Skeleton: Multilevel moderate to severe facet arthrosis. Grade 1 C7-T1 anterolisthesis Other neck: The nasopharynx is clear. The oropharynx and hypopharynx are normal. The epiglottis is normal. The supraglottic larynx, glottis and subglottic larynx are normal. No retropharyngeal collection. The parapharyngeal spaces are preserved. The parotid and submandibular glands  are normal. No sialolithiasis or salivary ductal dilatation. The thyroid gland is normal. There is no cervical lymphadenopathy. Upper chest: Bilateral upper lobe scarring and emphysema. Review of the MIP images confirms the above findings CTA HEAD FINDINGS Anterior circulation: --Intracranial internal carotid arteries: Non stenotic calcific atherosclerosis both internal carotid arteries at the skullbase. --Anterior cerebral arteries: Normal. --Middle cerebral arteries: Normal. --Posterior communicating arteries: Absent bilaterally. Posterior circulation: --Posterior cerebral arteries: Normal. --Superior cerebellar arteries: Normal. --Basilar artery: Normal. --Anterior inferior cerebellar arteries: Normal. --Posterior inferior cerebellar arteries: Diminished enhancement of the right PICA. Venous sinuses: As permitted by contrast timing, patent. Anatomic variants: None Delayed phase: Not performed. Review of the MIP images confirms the above findings IMPRESSION: 1. 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 2. No intracranial arterial occlusion or high-grade stenosis. 3. 55% stenosis of the proximal right internal carotid artery. 4. Aortic Atherosclerosis (ICD10-I70.0) and Emphysema (ICD10-J43.9). Critical Value/emergent results were called by telephone at the time of interpretation on 01/07/2017 at 2:48 pm to Dr. Johnney Killian, who verbally acknowledged these results. Electronically Signed   By: Ulyses Jarred M.D.   On: 01/07/2017 14:49   Ct Angio Neck W Or Wo Contrast  Result Date: 01/07/2017 CLINICAL DATA:  Aphasia EXAM: CT ANGIOGRAPHY HEAD AND NECK TECHNIQUE: Multidetector CT imaging of the head and neck was performed using the standard protocol during bolus administration of intravenous contrast. Multiplanar CT image reconstructions and MIPs were obtained to evaluate the vascular anatomy. Carotid stenosis measurements (when applicable) are obtained  utilizing NASCET criteria, using the distal internal carotid diameter as the denominator. CONTRAST:  50 mL Isovue 370 COMPARISON:  Head CT same date Brain MRI 06/07/2016 FINDINGS: CTA NECK FINDINGS Aortic arch: There is moderate calcific atherosclerosis of the aortic arch. There is no aneurysm, dissection or hemodynamically significant stenosis of the visualized ascending aorta and aortic arch. Conventional 3 vessel aortic branching pattern. The visualized proximal subclavian arteries are normal. Right carotid system: The right common carotid origin is widely patent. There is no common carotid or internal carotid artery dissection or aneurysm. There is mixed calcified and noncalcified plaque at the right carotid bifurcation the results in 55% stenosis. The right internal carotid artery is widely patent to the skullbase. Left carotid system: The left common carotid origin is widely patent. There is no common carotid or internal carotid artery dissection or aneurysm. Atherosclerotic calcification at the carotid bifurcation without hemodynamically significant stenosis. Vertebral arteries: The vertebral system is left dominant. Both vertebral artery origins are normal. The right vertebral artery is occluded at the V3 segment, new compared to 06/07/2016. Skeleton: Multilevel moderate to severe facet arthrosis. Grade 1 C7-T1 anterolisthesis Other neck: The nasopharynx is clear. The oropharynx and hypopharynx are normal. The epiglottis is normal. The  supraglottic larynx, glottis and subglottic larynx are normal. No retropharyngeal collection. The parapharyngeal spaces are preserved. The parotid and submandibular glands are normal. No sialolithiasis or salivary ductal dilatation. The thyroid gland is normal. There is no cervical lymphadenopathy. Upper chest: Bilateral upper lobe scarring and emphysema. Review of the MIP images confirms the above findings CTA HEAD FINDINGS Anterior circulation: --Intracranial internal  carotid arteries: Non stenotic calcific atherosclerosis both internal carotid arteries at the skullbase. --Anterior cerebral arteries: Normal. --Middle cerebral arteries: Normal. --Posterior communicating arteries: Absent bilaterally. Posterior circulation: --Posterior cerebral arteries: Normal. --Superior cerebellar arteries: Normal. --Basilar artery: Normal. --Anterior inferior cerebellar arteries: Normal. --Posterior inferior cerebellar arteries: Diminished enhancement of the right PICA. Venous sinuses: As permitted by contrast timing, patent. Anatomic variants: None Delayed phase: Not performed. Review of the MIP images confirms the above findings IMPRESSION: 1. 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 2. No intracranial arterial occlusion or high-grade stenosis. 3. 55% stenosis of the proximal right internal carotid artery. 4. Aortic Atherosclerosis (ICD10-I70.0) and Emphysema (ICD10-J43.9). Critical Value/emergent results were called by telephone at the time of interpretation on 01/07/2017 at 2:48 pm to Dr. Johnney Killian, who verbally acknowledged these results. Electronically Signed   By: Ulyses Jarred M.D.   On: 01/07/2017 14:49   Mr Brain Wo Contrast  Result Date: 01/08/2017 CLINICAL DATA:  Stroke followup. EXAM: MRI HEAD WITHOUT CONTRAST TECHNIQUE: Multiplanar, multiecho pulse sequences of the brain and surrounding structures were obtained without intravenous contrast. COMPARISON:  CT 01/07/2017 FINDINGS: Brain: Moderate atrophy without hydrocephalus. Negative for acute infarct. Moderate chronic ischemic changes throughout the cerebral white matter bilaterally. Small chronic infarct in the right parietal cortex. Negative for hemorrhage or mass. Vascular: Normal arterial flow voids Skull and upper cervical spine: Negative Sinuses/Orbits: Negative Other: None IMPRESSION: Negative for acute infarct. Moderate atrophy and chronic microvascular  ischemia. Electronically Signed   By: Franchot Gallo M.D.   On: 01/08/2017 13:07   Dg Chest Port 1 View  Result Date: 01/07/2017 CLINICAL DATA:  Confusion and combative.  Possible stroke. EXAM: PORTABLE CHEST 1 VIEW COMPARISON:  10/24/2016. FINDINGS: Stable cardiomegaly with tortuous thoracic calcified aorta. Left-sided pacemaker apparatus with right atrial and right ventricular leads are again noted. Mild subpleural areas of interstitial lung disease likely related to fibrosis. No pneumonic consolidation or overt pulmonary edema. The left costophrenic angle is excluded on this study. IMPRESSION: 1. Cardiomegaly with aortic atherosclerosis. 2. Subpleural interstitial changes consistent with pulmonary interstitial fibrosis. 3. No overt pulmonary edema nor acute pneumonic consolidations. Electronically Signed   By: Ashley Royalty M.D.   On: 01/07/2017 22:20   Ct Head Code Stroke Wo Contrast  Result Date: 01/07/2017 CLINICAL DATA:  Code stroke.  Aphasia EXAM: CT HEAD WITHOUT CONTRAST TECHNIQUE: Contiguous axial images were obtained from the base of the skull through the vertex without intravenous contrast. COMPARISON:  Brain MRI 06/07/2016 FINDINGS: The examination is severely degraded by motion. Brain: No acute hemorrhage. There is advanced atrophy with findings of chronic microvascular ischemia. No midline shift or other mass effect. Vascular: Poor visualization due to motion. Skull: No skull fracture. Sinuses/Orbits: No acute finding. Other: None. ASPECTS [facility] Stroke Program Early CT Score) There is too much motion on the examination to provide a reliable ASPECTS rating. IMPRESSION: 1. Severely motion degraded examination, but no visible acute hemorrhage. 2. ASPECTS is not able to be reliably assessed because of the degree of motion. However, there is no large territory infarct  or mass effect. Dr. Lorraine Lax paged at 2:00 PM Electronically Signed   By: Ulyses Jarred M.D.   On: 01/07/2017 14:02         Scheduled Meds: .  stroke: mapping our early stages of recovery book   Does not apply Once  . apixaban  5 mg Oral BID  . [START ON 01/09/2017] aspirin EC  81 mg Oral Daily  . atorvastatin  80 mg Oral q1800  . levETIRAcetam  750 mg Oral BID  . LORazepam      . LORazepam  0.25 mg Intravenous Once  . QUEtiapine  25 mg Oral QHS   Continuous Infusions:   LOS: 1 day    Time spent: 35 minutes.     Elmarie Shiley, MD [facility] Pager [phone]  If 7PM-7AM, please contact night-coverage [email or web address] Password [email or web address] 01/08/2017, 4:02 PM

## 2017-01-08 NOTE — Progress Notes (Signed)
Pt admitted to 3W around 2100 via stretcher, accompanied by wife. Pt was slid from the stretcher to the bed. Pt A&Ox1, hx of dementia and expressive aphasia-admission documentation completed with pt's wife. White bracelet in place with correct pt identifiers, fall risk and DNR bracelet placed in addition. Skin assessment completed with Lyndee Leo, RN-refer to documentation, wound nurse consult ordered for stg 2 to sacrum, foam applied. PIV to RAC and additional IV placed in LFA-NS infusing at 50 cc/hr. Placed mitts on pt d/t pulling at tubes/tele leads. Condom cath placed as well d/t incontinence. Pt and wife oriented to room/unit/rules. Call light within reach, bed alarm on for safety, low bed in place. Will ctm.

## 2017-01-08 NOTE — Progress Notes (Signed)
OT Cancellation    01/08/17 1300  OT Visit Information  Last OT Received On 01/08/17  Reason Eval/Treat Not Completed Patient at procedure or test/ unavailable (MRI. Will return as schedule allows.)   Hadyn Azer MSOT, OTR/L Acute Rehab Pager: [phone] Office: [phone]

## 2017-01-08 NOTE — Evaluation (Signed)
Speech Language Pathology Evaluation Patient Details Name: Herbert Marquez MRN: [id number] DOB: 20-Jul-1927 Today's Date: 01/08/2017 Time: 7062-3762 SLP Time Calculation (min) (ACUTE ONLY): 21 min  Problem List:  Patient Active Problem List   Diagnosis Date Noted  . Stroke ([place]) 01/07/2017  . Ischemic stroke ([place])   . Second degree Mobitz II AV block 10/23/2016  . Seizures ([place]) 10/09/2016  . Healthcare maintenance 10/01/2016  . Restlessness and agitation 07/02/2016  . Vascular dementia with behavior disturbance 06/29/2016  . Hemiparesis and other late effects of cerebrovascular accident ([place]) 06/29/2016  . Embolic stroke ([place]) 83/15/1761  . Right hemiparesis ([place])   . History of CVA with residual deficit   . Chronic anticoagulation   . Atrial fibrillation with rapid ventricular response ([place])   . Seizure prophylaxis   . Diastolic dysfunction   . Bacteremia due to Gram-positive bacteria   . Altered mental status   . Dementia without behavioral disturbance   . Leukocytosis   . Acute blood loss anemia   . Acute encephalopathy 06/04/2016  . PAF (paroxysmal atrial fibrillation) ([place])   . Iron deficiency anemia 04/04/2016  . History of CVA (cerebrovascular accident) 04/04/2016  . Cerebral hemorrhage (HCC) w/ SDH s/p IV tPA   . Coronary artery disease involving native coronary artery of native heart without angina pectoris   . Orthostatic hypotension   . History of right hip replacement   . Acute ischemic stroke ([place]) - L temporal lobe s/p tPA 03/30/2016  . BPH (benign prostatic hyperplasia) 11/25/2015  . GERD (gastroesophageal reflux disease) 11/25/2015  . CKD (chronic kidney disease), stage II 11/25/2015  . Overactive bladder   . Cerebrovascular accident (CVA) ([place]) 09/18/2015  . Gait disturbance, post-stroke 06/23/2015  . 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 Thalamic infarction ([place]) 06/21/2015  . Benign essential HTN   . Type 2 diabetes mellitus with complication,  without long-term current use of insulin ([place])   . Dysphagia as late effect of cerebrovascular disease   . Hyponatremia   . HLD (hyperlipidemia)   . COPD (chronic obstructive pulmonary disease) ([place]) 03/09/2011   Past Medical History:  Past Medical History:  Diagnosis Date  . Arthritis    "pretty much all over"   . Basal cell carcinoma    "several burned off his body, face, head"  . BPH (benign prostatic hypertrophy)   . Coronary artery disease    a. s/p PCI of RCA in 2006  . CVA (cerebral infarction)    a. 06/2015: left thalamic and bilateral PCA  . GERD (gastroesophageal reflux disease)   . Hyperlipidemia   . Hypertension   . Presence of permanent cardiac pacemaker   . Stroke ([place])   . TIA (transient ischemic attack)    Approximately 6 weeks post-cardiac catheterization.    Past Surgical History:  Past Surgical History:  Procedure Laterality Date  . CARDIOVASCULAR STRESS TEST  07/01/2007   EF 74%  . CATARACT EXTRACTION, BILATERAL    . CORONARY ANGIOPLASTY WITH STENT PLACEMENT  10/2004   stenting x 2 to RCA  . FRACTURE SURGERY    . HERNIA REPAIR    . INSERT / REPLACE / REMOVE PACEMAKER  10/23/2016  . LAPAROSCOPIC INCISIONAL / UMBILICAL / VENTRAL HERNIA REPAIR     "below his naval"   HPI:  Herbert Marquez an 83 y.o.malewith history ofprio CVAs,seizure disorder, hypertension, diabetes, atrial fibrillation, diastolic dysfunction, COPD, CAD, dementia in the past with residual right sided weakness and only ability to walk  short distances. He has no problems talking at baseline. Pt presents to ED with global aphasia and SOB. CT head 11/5 showed no acute bleed but had lots of movement. Patient was not a tPA candidate due to [facility]. MRI pending. CXR (1 view) 11/5 shows subpleural interstitial changes consistent with pulmonary interstitial fibrosis and no overt pulmonary edema nor acute pneumonic consolidations.   Assessment / Plan / Recommendation Clinical Impression  Pt seen  briefly for SLE during am meal; evaluation limited secondary to pt's attention to his meal. SLP observed subjective improvement in overall functioning since admit per previous notes and wife report; though accurate baseline abilities not clearly stated.   Pt demonstrated ability to independently follow 1-step commands, initiate/maintain conversation, ask/answer questions appropriately and complete confrontational naming tasks with no deficits noted; higher level language tasks not assessed. Pt attended to his meal and all tasks throughout evaluation, demonstrated ability to answer long term memory questions; concern for processing speed and executive functioning persists. Recommend diagnostic treatment to assess higher level language and cognitive tasks. Will continue to follow for SLP intervention.     SLP Assessment  SLP Recommendation/Assessment: Patient needs continued Speech Lanaguage Pathology Services SLP Visit Diagnosis: Frontal lobe and executive function deficit Frontal lobe and executive function deficit following: Cerebral infarction    Follow Up Recommendations  Other (comment)(TBD)    Frequency and Duration min 2x/week  2 weeks      SLP Evaluation Cognition  Overall Cognitive Status: Impaired/Different from baseline Arousal/Alertness: Awake/alert Orientation Level: Oriented to place;Oriented to person Attention: Sustained Sustained Attention: Appears intact Memory: Appears intact Executive Function: Initiating Initiating: Appears intact Safety/Judgment: Appears intact Comments: SLE Limited       Comprehension  Auditory Comprehension Overall Auditory Comprehension: Appears within functional limits for tasks assessed Commands: Within Functional Limits Conversation: Simple Interfering Components: Processing speed Visual Recognition/Discrimination Discrimination: Not tested Reading Comprehension Reading Status: Not tested    Expression Expression Primary Mode of  Expression: Verbal Verbal Expression Overall Verbal Expression: Appears within functional limits for tasks assessed Level of Generative/Spontaneous Verbalization: Word Naming: No impairment Pragmatics: No impairment Non-Verbal Means of Communication: Not applicable Written Expression Dominant Hand: Right Written Expression: Not tested   Oral / Motor  Oral Motor/Sensory Function Overall Oral Motor/Sensory Function: Within functional limits Motor Speech Overall Motor Speech: Appears within functional limits for tasks assessed Respiration: Within functional limits Phonation: Low vocal intensity Resonance: Within functional limits Articulation: Within functional limitis Intelligibility: Intelligible Motor Planning: Witnin functional limits   GO                    Aaron Edelman, Student SLP 01/08/2017, 10:05 AM

## 2017-01-08 NOTE — Evaluation (Signed)
Physical Therapy Evaluation Patient Details Name: Herbert Marquez MRN: [id number] DOB: 1927-07-16 Today's Date: 01/08/2017   History of Present Illness  82 y.o. male with history of prio CVAs, seizure disorder, hypertension, diabetes, atrial fibrillation, diastolic dysfunction, COPD, CAD, dementia  in the past with residual right sided weakness and only ability to walk short distances. He has no problems talking at baseline. Pt presents to ED with global aphasia and SOB. MRI negative for acute infarcts.  Clinical Impression   Pt admitted with above diagnosis. Pt currently with functional limitations due to the deficits listed below (see PT Problem List). Pt presenting with increased weakness, confusion, and instability in sitting and standing. Pt is currently, modAx2 for bed mobility, transfers and ambulation of 12 feet with RW. Pt highly motivated to return to PLOF and family is very supportive. Discussed different dc options and need for post-acute rehab; family reports prior experience with CIR level rehab.  Pt will benefit from skilled PT in the acute setting to increase their independence and safety with mobility to allow discharge to the venue listed below.        Follow Up Recommendations CIR    Equipment Recommendations  None recommended by PT    Recommendations for Other Services Rehab consult     Precautions / Restrictions Precautions Precautions: Fall Restrictions Weight Bearing Restrictions: No      Mobility  Bed Mobility Overal bed mobility: Needs Assistance Bed Mobility: Supine to Sit     Supine to sit: Mod assist;+2 for physical assistance;HOB elevated     General bed mobility comments: Pt requiring Max tactile, verbal, and visual cues to sequence bed mobility and use bedrails. Pt requiring Mod A +2 to manage BLEs and bring hips toward EOB with pad. Pt able to elevate trunk with cues to push through LUE  Transfers Overall transfer level: Needs  assistance Equipment used: Rolling walker (2 wheeled) Transfers: Sit to/from Stand Sit to Stand: +2 physical assistance;Min assist;Mod assist         General transfer comment: Pt requiring Min A for first sit <>stand using RW. As pt fatigued, pt required ModA+2 to control descent to recliner and Max VCs for safe sequencing.  Ambulation/Gait Ambulation/Gait assistance: Mod assist;+2 physical assistance Ambulation Distance (Feet): 12 Feet Assistive device: Rolling walker (2 wheeled) Gait Pattern/deviations: Step-through pattern;Decreased weight shift to right;Decreased stride length;Shuffle;Antalgic;Staggering right Gait velocity: slowed Gait velocity interpretation: Below normal speed for age/gender General Gait Details: modAx2 for ambulation, maximal vc for sequencing, increased knee flexion with R LE advancement, upright posture     Balance Overall balance assessment: Needs assistance Sitting-balance support: No upper extremity supported;Feet supported Sitting balance-Leahy Scale: Fair Sitting balance - Comments: Able to maintain static sitting at Eob with [employer] for safety. Pt with posterior and lateral R lean with dynamic movement and fatigue                                     Pertinent Vitals/Pain Pain Assessment: Faces Faces Pain Scale: No hurt Pain Intervention(s): Monitored during session;Limited activity within patient's tolerance    Home Living Family/patient expects to be discharged to:: Private residence Living Arrangements: Spouse/significant other Available Help at Discharge: Family;Available 24 hours/day Type of Home: House Home Access: Ramped entrance     Home Layout: One level Home Equipment: Walker - 2 wheels;Cane - single point;Wheelchair - Banker;Shower seat;Grab bars - tub/shower Additional Comments: Spent  a long time talking with pt and familty about [facility] services to optimize funcitonal performance and safety    Prior  Function Level of Independence: Independent with assistive device(s)         Comments: Pt was primarily w/c level PTA. He was independent with short funcitonal mobility with RW and wife providing overall supervision and set-up. She reports he could dress and bathe himself but sometimes she would assist for time management. Pt using w/c in the home for mobility but was walking with HHPT and wife providing assistance with RW.     Hand Dominance   Dominant Hand: Right    Extremity/Trunk Assessment   Upper Extremity Assessment Upper Extremity Assessment: Defer to OT evaluation RUE Deficits / Details: Pt with poor RUe coordination and grasp. Pt unable to perform finger oppositon.     Lower Extremity Assessment Lower Extremity Assessment: RLE deficits/detail;LLE deficits/detail RLE Deficits / Details: hip strength grossly 4-/5, knee extension -4/5, knee flex 4/5, ankle dorsi/plantar flex 4/5, knee lacks 5 degrees of extension,  hip and ankle ROM WFL RLE Sensation: decreased light touch LLE Deficits / Details: generalized weakness, ROM WFL    Cervical / Trunk Assessment Cervical / Trunk Assessment: Kyphotic  Communication   Communication: HOH  Cognition Arousal/Alertness: Awake/alert Behavior During Therapy: WFL for tasks assessed/performed Overall Cognitive Status: History of cognitive impairments - at baseline Area of Impairment: Attention;Following commands;Awareness                   Current Attention Level: Sustained   Following Commands: Follows one step commands inconsistently;Follows one step commands with increased time   Awareness: Intellectual   General Comments: Pt with baseline dementia. However, pt demonstrating decreased cognititon compared to baseline and required increased cues and time. At bengining of session, pt joking and able to asker questions correctly (with confirmation by wife). As session progressed, pt requiring increased cues for sequencing and  safety and demonstrated increased confusion. Feel pt fatiigued quickly.       General Comments General comments (skin integrity, edema, etc.): Wife and son present in session        Assessment/Plan    PT Assessment Patient needs continued PT services  PT Problem List Decreased strength;Decreased range of motion;Decreased activity tolerance;Decreased balance;Decreased mobility;Decreased coordination;Decreased cognition;Decreased safety awareness;Impaired sensation       PT Treatment Interventions DME instruction;Gait training;Functional mobility training;Therapeutic activities;Therapeutic exercise;Balance training;Neuromuscular re-education;Cognitive remediation;Patient/family education    PT Goals (Current goals can be found in the Care Plan section)  Acute Rehab PT Goals Patient Stated Goal: Get stronger before going home PT Goal Formulation: With patient/family Time For Goal Achievement: 01/22/17 Potential to Achieve Goals: Fair    Frequency Min 4X/week        Co-evaluation PT/OT/SLP Co-Evaluation/Treatment: Yes Reason for Co-Treatment: For patient/therapist safety;Necessary to address cognition/behavior during functional activity PT goals addressed during session: Mobility/safety with mobility;Balance;Proper use of DME OT goals addressed during session: Proper use of Adaptive equipment and DME;Strengthening/ROM       AM-PAC PT "6 Clicks" Daily Activity  Outcome Measure Difficulty turning over in bed (including adjusting bedclothes, sheets and blankets)?: Unable Difficulty moving from lying on back to sitting on the side of the bed? : Unable Difficulty sitting down on and standing up from a chair with arms (e.g., wheelchair, bedside commode, etc,.)?: Unable Help needed moving to and from a bed to chair (including a wheelchair)?: A Little Help needed walking in hospital room?: A Lot Help needed climbing 3-5 steps  with a railing? : Total 6 Click Score: 9    End of  Session Equipment Utilized During Treatment: Gait belt Activity Tolerance: Patient tolerated treatment well Patient left: in chair;with call bell/phone within reach;with chair alarm set;with family/visitor present Nurse Communication: Mobility status PT Visit Diagnosis: Unsteadiness on feet (R26.81);Other abnormalities of gait and mobility (R26.89);Muscle weakness (generalized) (M62.81);Difficulty in walking, not elsewhere classified (R26.2);Other symptoms and signs involving the nervous system (R29.898);History of falling (Z91.81);Hemiplegia and hemiparesis Hemiplegia - Right/Left: Right Hemiplegia - dominant/non-dominant: Dominant Hemiplegia - caused by: Unspecified(previous cerebral infarct)    Time: 1545-1610 PT Time Calculation (min) (ACUTE ONLY): 25 min   Charges:   PT Evaluation $PT Eval Moderate Complexity: 1 Mod     PT G Codes:        Aranda Bihm B. Migdalia Dk PT, DPT Acute Rehabilitation  [phone] Pager [phone]    [place] 01/08/2017, 5:35 PM

## 2017-01-08 NOTE — Consult Note (Addendum)
[facility] Nurse wound consult note Reason for Consult: Consult requested for buttocks/sacrum. Wound type: Partial thickness fissure related to moisture located at the top of the gluteal fold, .2X.1X.1cm, pink and moist. Pressure Injury POA:  This was present on admission, NOT a pressure injury. Dressing procedure/placement/frequency: Pt's wife is at the bedside to assess wound appearance and discuss plan of care.  She states that patient's skin declined last admission when he was wearing a foam dressing, and requests that it be left off.  Barrier cream to protect and promote healing. Condom cath is being applied at this time to attempt to contain urinary incontinence. Please re-consult if further assistance is needed.  Thank-you,  Julien Girt MSN, [place], [place], [place], [place]

## 2017-01-08 NOTE — Progress Notes (Signed)
SLP Cancellation Note - Bedside Swallow Evaluation  Patient Details Name: Herbert Marquez MRN: [id number] DOB: Oct 29, 1927   Cancelled treatment:       Reason Eval/Treat Not Completed: Other (comment);SLP screened, no needs identified, will sign off. Pt passed Rn stroke swallow and placed on diet. Pt is tolerating current diet per wife. If concerns arise please reorder SLP swallow eval.    Saman Giddens, Katherene Ponto 01/08/2017, 11:10 AM

## 2017-01-08 NOTE — Progress Notes (Signed)
STROKE TEAM PROGRESS NOTE   SUBJECTIVE (INTERVAL HISTORY) His wife and daughter is at the bedside.  Wife stated that yesterday he had sudden onset speech difficulty and then getting worsen. No shaking jerking or LOC. Came to ED became agitated and combative. Overnight, he was confused and behavior disturbance. However, this am, he is much improved, now talking well, moving all extremities and answering questions appropriately, not agitated. Back to baseline. Voices no new complaints. No new events reported by nursing.   OBJECTIVE Recent Labs  Lab 01/07/17 1328  GLUCAP 113*   Recent Labs  Lab 01/07/17 1332 01/07/17 1339  NA 133* 136  K 3.8 3.9  CL 102 102  CO2 18*  --   GLUCOSE 116* 117*  BUN 18 21*  CREATININE 1.12 1.00  CALCIUM 9.3  --    Recent Labs  Lab 01/07/17 1332  AST 43*  ALT 23  ALKPHOS 86  BILITOT 0.9  PROT 7.0  ALBUMIN 3.7   Recent Labs  Lab 01/07/17 1332 01/07/17 1339  WBC 13.9*  --   NEUTROABS 11.3*  --   HGB 13.9 14.6  HCT 40.6 43.0  MCV 90.4  --   PLT 381  --    No results for input(s): CKTOTAL, CKMB, CKMBINDEX, TROPONINI in the last 168 hours. Recent Labs    01/07/17 1332  LABPROT 17.7*  INR 1.47   Recent Labs    01/07/17 1504  COLORURINE YELLOW  LABSPEC 1.024  PHURINE 8.0  GLUCOSEU NEGATIVE  HGBUR NEGATIVE  BILIRUBINUR NEGATIVE  KETONESUR 5*  PROTEINUR NEGATIVE  NITRITE NEGATIVE  LEUKOCYTESUR NEGATIVE       Component Value Date/Time   CHOL 115 01/07/2017 1332   TRIG 77 01/07/2017 1332   HDL 38 (L) 01/07/2017 1332   CHOLHDL 3.0 01/07/2017 1332   VLDL 15 01/07/2017 1332   LDLCALC 62 01/07/2017 1332   Lab Results  Component Value Date   HGBA1C 5.7 (H) 01/07/2017      Component Value Date/Time   LABOPIA NONE DETECTED 01/07/2017 1504   COCAINSCRNUR NONE DETECTED 01/07/2017 1504   LABBENZ NONE DETECTED 01/07/2017 1504   AMPHETMU NONE DETECTED 01/07/2017 1504   THCU NONE DETECTED 01/07/2017 1504   LABBARB NONE DETECTED  01/07/2017 1504    No results for input(s): ETH in the last 168 hours.  IMAGING: I have personally reviewed the radiological images below and agree with the radiology interpretations.  Ct Angio Head and neck W Or Wo Contrast 01/07/2017 IMPRESSION: 1. 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 2. No intracranial arterial occlusion or high-grade stenosis. 3. 55% stenosis of the proximal right internal carotid artery. 4. Aortic Atherosclerosis (ICD10-I70.0) and Emphysema (ICD10-J43.9).   Dg Chest Port 1 View 01/07/2017 IMPRESSION: 1. Cardiomegaly with aortic atherosclerosis. 2. Subpleural interstitial changes consistent with pulmonary interstitial fibrosis. 3. No overt pulmonary edema nor acute pneumonic consolidations.   Ct Head Code Stroke Wo Contrast 01/07/2017 IMPRESSION: 1. Severely motion degraded examination, but no visible acute hemorrhage. 2. ASPECTS is not able to be reliably assessed because of the degree of motion. However, there is no large territory infarct or mass effect.   Mr Brain Wo Contrast 01/08/2017 IMPRESSION: Negative for acute infarct. Moderate atrophy and chronic microvascular ischemia.   TTE pending  EEG pending    PHYSICAL EXAM Temp:  [97.5 F (36.4 C)-98.6 F (37 C)] 97.5 F (36.4 C) (11/06 1057) Pulse Rate:  [69-87] 70 (11/06  1057) Resp:  [12-25] 18 (11/06 1057) BP: (101-164)/(61-131) 105/61 (11/06 1057) SpO2:  [88 %-100 %] 94 % (11/06 1057) Weight:  [74.3 kg (163 lb 12.8 oz)] 74.3 kg (163 lb 12.8 oz) (11/05 2102)  General - Well nourished, well developed, in no apparent distress Respiratory - Lungs clear bilaterally. No wheezing. Cardiovascular - Irregular rate and rhythm   Neurological Examination Mental Status: Awake, cooperative with examiner. Oriented to family and simple questioning, Clear speech pattern. Following all commands Cranial Nerves: II: Visual fields appear intact  OU III,IV, VI: ptosis not present, extra-ocular motions intact bilaterally, pupils equal, round, reactive to light and accommodation V,VII: smile symmetric, facial light touch sensation normal bilaterally VIII: hearing normal bilaterally but HOH IX,X: uvula rises symmetrically XI: bilateral shoulder shrug XII: midline tongue extension Motor: He has residual right hemiparesis with RUE mild pronator drift and right hand mild dexterity difficulty, RLE 4/5 but able to lift antigravity and 5/5 strength on the left.  Some mild contraction in the right arm Sensory: Pinprick and light touch intact throughout, bilaterally Cerebellar: No ataxia with finger to nose testing Gait: not teested  ASSESSMENT AND PLAN: Herbert Marquez is an 83 y.o. male with history of prio CVAs, seizure disorder, hypertension, diabetes, atrial fibrillation, diastolic dysfunction, COPD, CAD, dementia  in the past with residual right sided weakness and only ability to walk short distances presented with agitated and and unable to speak. The wife did not notice any staring episode or seizure like activity. HE required 1 mg ativan and 10 mg Haldol to sedate patient but he was still very agitated. He had taken his dose of Eliquis. CT head showed no acute bleed but had lots of movement. Patient was not a tPA candidate due to [facility] and not a candidate for IR as he did not have LVO.  Likely seizure episode, less likely TIA - acute onset agitation, speech difficulty with post ictal confusion, agitation, combative, resolved overnight.   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  CT Head - 01/07/17 Severely motion degraded examination, but no visible acute hemorrhage.  MRI brain - no acute infarct  2D Echo - PENDING  EEG -PENDING  LDL - 62  HgbA1c 5.7  VTE prophylaxis - SCD's  Diet -  Diet Heart Room service appropriate? Yes; Fluid consistency: Thin   CVA Meds-   aspirin 81 mg daily and Eliquis 5 mg BID prior to admission, now on aspirin 325 mg daily. No acute infarct, recommend to resume ASA 81mg  and eliquis 5mg  bid  Family counseled to be compliant with her antithrombotic medications  Ongoing aggressive stroke risk factor management  Therapy recommendations:  PENDING  Disposition: HOME vs SNF  Follow up with [facility], Dr Erlinda Hong on December 11,2018  HX OF STROKES: 11/2013 TIA - 6 weeks after cardiac cath.  06/2015 Bilateral Thalami and right mesial temporal lobe due to left PCA occlusion and right PCA high grade stenosis &afib   11/28/2015 post right hip fracture surgery on eliquis but later discontinued at follow up clinic and put on DAPT,  03/30/2016 -  admitted for speechdifficulties and aphasia. Hereceived IV t-PA. MRI showed left temproal infarct with hemorrhagic conversion and SDH. CTA head and neck 60-70% proximal right ICA and 40-50% left ICA stenosis. EF 65-70% in 11/2015. LDL 38 and A1C 5.0. ASA started 7 days post stroke  due to hemorrhagic conversion and eliquis started on 04/13/16. lipitor down from 80mg  to 40mg . He was discharged to CIR.   06/04/2016 -readmission - pt admitted for acute onset confusion and agitation. MRI punctate right hippocampal infarctlikely acute, and old left temporal lobe hemorrhagic infarct. His spell on admission most suggestive of seizure was started on Keppra at 500 mg twice daily. EEG moderatediffuse slowing. His eliquis and lipitor continued and discharged to CIR.  07/25/16 follow up (CM) - Patient returns to the stroke clinic today for follow-up he has not had further stroke TIA symptoms, he remains on eliquis for secondary stroke prevention and atrial fibrillation. He has not had any further seizure activity and remains on Keppra 500 twice daily. He is on Lipitor for hyperlipidemia without complaints of muscle  aches. His Seroquel controls his agitation according to the wife and he sleeps well at night. Appetite is reportedly good. His therapies have concluded. [facility]He has no new neurologic findings  RICA INTRACRANIAL STENOSIS:  On ASA 81 mg and Eliquis 5mg  BID at home  Continue ASA and eliquis  CTA showed right ICA 55%  SEIZURE DISORDER:  No seizure activity reported at home or since admission  Seizure precautions ordered  Similar presentation of 06/2016 admission with presentation of acute onset confusion and agitation.  Keppra level - pending  EEG - pending  Increase keppra to 750mg  bid.  DEMENTIA / SUNDOWNING:  U/A and UDS Negative  Continue home dose of Seroquel for now  Limit use of Ativan for behavior issues  AFIB, CHRONIC:  Rate control on telemetry  Continue to monitor closely  Resume eliquis   DIASTOLIC DYSFUNCTION:  Diastolic dysfunction. Echo in April of this year reveals an EF 55% grade 1 DD  Management per primary team   ECHO - pending  HYPONATREMIA:  Na 133 today  Will continue to monitor closely, follow AM labs  HYPERTENSION: Stable Permissive hypertension (OK if <220/120) for 24-48 hours post stroke and then gradually normalized within 5-7 days. PRN hydralazine, if necessary  Long term BP goal normotensive.   Close PCP follow up.  HYPERLIPIDEMIA:  Home meds:  Lipitor 80 mg  LDL    62     , goal < 70  Continued on  Lipitor to 80 mg daily  Continue statin at discharge  Other Active Problems:  CKD III  COPD  Aortic Atherosclerosis  Emphysema   Other Stroke Risk Factors:  Advanced age  Hx stroke/TIA  Coronary artery disease  Hospital day # 1  Renie Ora Stroke Neurology Team 01/08/2017 12:59 PM   I reviewed above note and agree with the assessment and plan. I have made any additions or clarifications directly to the above note. Pt was seen and examined. Clinical  course more likely seizure episode with post ictal confusion, agitation. MRI no acute infarct, resume eliquis and ASA. Will increase keppra dose. Check keppra  Level. Pt seems back to baseline. Pending TTE and EEG. Plan for d/c tomorrow if stable.   Rosalin Hawking, MD PhD Stroke Neurology 01/08/2017 2:19 PM     To contact Stroke Continuity provider, please refer to [email or web address] After hours, contact General Neurology

## 2017-01-08 NOTE — Progress Notes (Signed)
EEG complete - results pending 

## 2017-01-08 NOTE — Evaluation (Addendum)
Occupational Therapy Evaluation Patient Details Name: Herbert Marquez MRN: [id number] DOB: Feb 03, 1928 Today's Date: 01/08/2017    History of Present Illness 83 y.o. male with history of prio CVAs, seizure disorder, hypertension, diabetes, atrial fibrillation, diastolic dysfunction, COPD, CAD, dementia  in the past with residual right sided weakness and only ability to walk short distances. He has no problems talking at baseline. Pt presents to ED with global aphasia and SOB. MRI negative for acute infarcts.   Clinical Impression   PTA, pt was living with his wife who provided set up and supervision for BADLs and short distance functional mobility with RW. Currently pt requiring Mod A for UB ADLs and Max A for LB ADL. Pt presenting with increased weakness, confusion, and instability in sitting and standing. Pt highly motivated to return to PLOF and family is very supportive. Discussed different dc options and need for post-acute rehab; family reports prior experience with CIR level rehab. Recommend post-acute OT to optimize safety and independence with ADLs and functional mobility and decreased caregiver burden. Will continue to follow acutely to facilitate safe dc.    Follow Up Recommendations  CIR;Supervision/Assistance - 24 hour    Equipment Recommendations  Other (comment)(Defer to next venue)    Recommendations for Other Services PT consult;Rehab consult     Precautions / Restrictions Precautions Precautions: Fall Restrictions Weight Bearing Restrictions: No      Mobility Bed Mobility Overal bed mobility: Needs Assistance Bed Mobility: Supine to Sit     Supine to sit: Mod assist;+2 for physical assistance;HOB elevated     General bed mobility comments: Pt requiring Max tactile, verbal, and visual cues to sequence bed mobility and use bedrails. Pt requiring Mod A +2 to manage BLEs and bring hips toward EOB with pad. Pt able to elevate trunk with cues to push through  LUE  Transfers Overall transfer level: Needs assistance Equipment used: Rolling walker (2 wheeled) Transfers: Sit to/from Stand Sit to Stand: +2 physical assistance;Min assist;Mod assist         General transfer comment: Pt requiring Min A for first sit <>stand using RW. As pt fatigued, pt required ModA+2 to control descent to recliner and Max VCs for safe sequencing.    Balance Overall balance assessment: Needs assistance Sitting-balance support: No upper extremity supported;Feet supported Sitting balance-Leahy Scale: Fair Sitting balance - Comments: Able to maintain static sitting at Eob with [employer] for safety. Pt with posterior and lateral R lean with dynamic movement and fatigue                                   ADL either performed or assessed with clinical judgement   ADL Overall ADL's : Needs assistance/impaired Eating/Feeding: Set up;Sitting Eating/Feeding Details (indicate cue type and reason): supported sitting Grooming: Min guard;Sitting Grooming Details (indicate cue type and reason): Pt able to maintain static sitting at EOB to perform grooming. As pt fatigued, pt presents with posterior and lateral R lean.  Upper Body Bathing: Cueing for sequencing;Sitting;Moderate assistance   Lower Body Bathing: Maximal assistance;Sit to/from stand;Cueing for sequencing   Upper Body Dressing : Sitting;Moderate assistance;Cueing for sequencing Upper Body Dressing Details (indicate cue type and reason): Pt requiring Mod A and Max cues to don gown like a jacket.  Lower Body Dressing: Maximal assistance;Sit to/from stand;Cueing for sequencing   Toilet Transfer: Moderate assistance;Maximal assistance;+2 for physical assistance;Ambulation;RW(Simulated to recliner) Armed forces technical officer Details (indicate cue type  and reason): Pt requiring Mod A + 2 for initial ambulation. As pt fatgiued, his lateral R lean became more significant and he required Max cues to bring RLE forward.  Pt demonstrating increased confusion and          Functional mobility during ADLs: Moderate assistance;Maximal assistance;+2 for physical assistance;Rolling walker;Cueing for sequencing(Pt requiring Mod A +2 for funcitonal mobility with RW. Pt requiring Max VCs to bring RLE forward and manage RW) General ADL Comments: Pt demonstrating decreased funcitonal performance compared to baseline. Pt with increase weakness and confusion. Pt requiring Mod A for UB ADLs and Max A for LB ADLs.      Vision Baseline Vision/History: Wears glasses Wears Glasses: Reading only Patient Visual Report: No change from baseline       Perception     Praxis      Pertinent Vitals/Pain Pain Assessment: Faces Faces Pain Scale: No hurt Pain Intervention(s): Monitored during session     Hand Dominance Right   Extremity/Trunk Assessment Upper Extremity Assessment Upper Extremity Assessment: Defer to OT evaluation RUE Deficits / Details: Pt with poor RUe coordination and grasp. Pt unable to perform finger oppositon.    Lower Extremity Assessment Lower Extremity Assessment: Defer to PT evaluation   Cervical / Trunk Assessment Cervical / Trunk Assessment: Kyphotic   Communication Communication Communication: HOH   Cognition Arousal/Alertness: Awake/alert Behavior During Therapy: WFL for tasks assessed/performed Overall Cognitive Status: History of cognitive impairments - at baseline Area of Impairment: Attention;Following commands;Awareness                   Current Attention Level: Sustained   Following Commands: Follows one step commands inconsistently;Follows one step commands with increased time   Awareness: Intellectual   General Comments: Pt with baseline dementia. However, pt demonstrating decreased cognititon compared to baseline and required increased cues and time. At bengining of session, pt joking and able to asker questions correctly (with confirmation by wife). As session  progressed, pt requiring increased cues for sequencing and safety and demonstrated increased confusion. Feel pt fatiigued quickly.    General Comments  Wife and son present in session    Exercises     Shoulder [place] expects to be discharged to:: Private residence Living Arrangements: Spouse/significant other Available Help at Discharge: Family;Available 24 hours/day Type of Home: House Home Access: Ramped entrance     Home Layout: One level     Bathroom Shower/Tub: Occupational psychologist: Handicapped height Bathroom Accessibility: Yes How Accessible: Accessible via wheelchair Home Equipment: Douds - 2 wheels;Cane - single point;Wheelchair - Banker;Shower seat;Grab bars - tub/shower   Additional Comments: Spent a long time talking with pt and familty about HH services to optimize funcitonal performance and safety  Lives With: Spouse    Prior Functioning/Environment Level of Independence: Independent with assistive device(s)        Comments: Pt was primarily w/c level PTA. He was independent with short funcitonal mobility with RW and wife providing overall supervision and set-up. She reports he could dress and bathe himself but sometimes she would assist for time management. Pt using w/c in the home for mobility but was walking with HHPT and wife providing assistance with RW.        OT Problem List: Decreased strength;Decreased range of motion;Decreased activity tolerance;Impaired balance (sitting and/or standing);Decreased cognition;Decreased safety awareness;Decreased knowledge of use of DME or AE      OT Treatment/Interventions: Self-care/ADL training;Therapeutic  exercise;DME and/or AE instruction;Energy conservation;Therapeutic activities;Patient/family education    OT Goals(Current goals can be found in the care plan section) Acute Rehab OT Goals Patient Stated Goal: Get stronger before going home OT  Goal Formulation: With patient/family Time For Goal Achievement: 01/22/17 Potential to Achieve Goals: Good ADL Goals Pt Will Perform Grooming: standing;with min guard assist Pt Will Perform Upper Body Dressing: with set-up;with supervision;sitting(Min VCs) Pt Will Perform Lower Body Dressing: with min guard assist;sit to/from stand(Min VCs) Pt Will Transfer to Toilet: with min assist;ambulating;bedside commode Pt Will Perform Toileting - Clothing Manipulation and hygiene: with min guard assist;sit to/from stand  OT Frequency: Min 2X/week   Barriers to D/C:            Co-evaluation              AM-PAC PT "6 Clicks" Daily Activity     Outcome Measure Help from another person eating meals?: A Little Help from another person taking care of personal grooming?: A Little Help from another person toileting, which includes using toliet, bedpan, or urinal?: A Lot Help from another person bathing (including washing, rinsing, drying)?: A Lot Help from another person to put on and taking off regular upper body clothing?: A Lot Help from another person to put on and taking off regular lower body clothing?: A Lot 6 Click Score: 14   End of Session Equipment Utilized During Treatment: Gait belt;Rolling walker Nurse Communication: Mobility status  Activity Tolerance: Patient tolerated treatment well;Patient limited by fatigue Patient left: in chair;with call bell/phone within reach;with chair alarm set;with family/visitor present  OT Visit Diagnosis: Unsteadiness on feet (R26.81);Other abnormalities of gait and mobility (R26.89);Muscle weakness (generalized) (M62.81);Other symptoms and signs involving cognitive function                Time: 1532-1610 OT Time Calculation (min): 38 min Charges:  OT General Charges $OT Visit: 1 Visit OT Evaluation $OT Eval Moderate Complexity: 1 Mod G-Codes:     [place] MSOT, OTR/L Acute Rehab Pager: [phone] Office: [place] 01/08/2017, 4:45 PM

## 2017-01-08 NOTE — Progress Notes (Signed)
PT Cancellation Note  Patient Details Name: [name] MRN: [id number] DOB: Mar 29, 1927   Cancelled Treatment:    Reason Eval/Treat Not Completed: Patient at procedure or test/unavailable Pt at MRI. PT will check back as able. Thanks,  Dani Gobble. Migdalia Dk PT, DPT Acute Rehabilitation  [phone] Pager [phone]     [place] 01/08/2017, 2:47 PM

## 2017-01-08 NOTE — Progress Notes (Signed)
  Echocardiogram 2D Echocardiogram has been performed.  Kiyla Ringler T Rick Carruthers 01/08/2017, 10:37 AM

## 2017-01-08 NOTE — Progress Notes (Signed)
Rehab Admissions Coordinator Note:  Patient was screened by Cleatrice Burke for appropriateness for an Inpatient Acute Rehab Consult per therapy recommendations. At this time, we are recommending Inpatient Rehab consult. Please place order for consult. Pt's son has visited unit today and I spoke with him upon arrival, He is hopeful that is Dad can be readmitted for his Dad has been a patient three times before.  Cleatrice Burke 01/08/2017, 8:39 PM  I can be reached at [phone].

## 2017-01-09 ENCOUNTER — Ambulatory Visit: Payer: Medicare Other | Admitting: Adult Health

## 2017-01-09 DIAGNOSIS — I69359 Hemiplegia and hemiparesis following cerebral infarction affecting unspecified side: Secondary | ICD-10-CM

## 2017-01-09 DIAGNOSIS — I69398 Other sequelae of cerebral infarction: Secondary | ICD-10-CM

## 2017-01-09 LAB — BASIC METABOLIC PANEL
Anion gap: 8 (ref 5–15)
BUN: 17 mg/dL (ref 6–20)
CO2: 21 mmol/L — ABNORMAL LOW (ref 22–32)
Calcium: 8.9 mg/dL (ref 8.9–10.3)
Chloride: 109 mmol/L (ref 101–111)
Creatinine, Ser: 0.98 mg/dL (ref 0.61–1.24)
GFR calc Af Amer: >60 mL/min (ref >60)
GFR calc non Af Amer: >60 mL/min (ref >60)
Glucose, Bld: 106 mg/dL — ABNORMAL HIGH (ref 65–99)
Potassium: 4.2 mmol/L (ref 3.5–5.1)
Sodium: 138 mmol/L (ref 135–145)

## 2017-01-09 LAB — CBC
HCT: 38.4 % — ABNORMAL LOW (ref 39.0–52.0)
Hemoglobin: 12.5 g/dL — ABNORMAL LOW (ref 13.0–17.0)
MCH: 30 pg (ref 26.0–34.0)
MCHC: 32.6 g/dL (ref 30.0–36.0)
MCV: 92.3 fL (ref 78.0–100.0)
Platelets: 366 10*3/uL (ref 150–400)
RBC: 4.16 MIL/uL — ABNORMAL LOW (ref 4.22–5.81)
RDW: 14.2 % (ref 11.5–15.5)
WBC: 13.2 10*3/uL — ABNORMAL HIGH (ref 4.0–10.5)

## 2017-01-09 MED ORDER — QUETIAPINE FUMARATE 25 MG PO TABS: 12.5000 mg | ORAL_TABLET | Freq: Every day | ORAL | Status: DC | PRN

## 2017-01-09 MED ORDER — QUETIAPINE FUMARATE 25 MG PO TABS
25.0000 mg | ORAL_TABLET | Freq: Every day | ORAL | Status: DC
  Administered 2017-01-09 – 2017-01-10 (×2): 25 mg via ORAL
  Filled 2017-01-09 (×2): qty 1

## 2017-01-09 NOTE — Progress Notes (Signed)
PROGRESS NOTE    Herbert Marquez  ZDG:[id number] DOB: June 21, 1927 DOA: 01/07/2017 PCP: [name], NP    Brief Narrative: HPI: Herbert Marquez is a 83 y.o. male with medical history significant for multiple strokes with residual right-sided weakness, hypertension, seizure disorder, diabetes, A. Fib, GERD, diastolic dysfunction, COPD, CAD, dementia, presents to the emergency department with the chief complaint difficulty speaking. Initial evaluation reveals acute ischemic stroke. [facility] are asked to admit.  Information is obtained from the chart and the patient's wife is at the bedside. She states he was in his usual state of health in the doctor's office today and he suddenly had difficulty speaking. She states "I couldn't understand what he was saying and". By the time they got home she had difficulty getting him into the house as "he could not follow my instructions". There are no complaints of headache dizziness syncope or near-syncope. No complaints of chest pain palpitation shortness of breath. No nausea vomiting. No diarrhea dysuria hematuria frequency or urgency. Patient's wife reports he suffers from chronic pain "all over". No fever chills recent travel or sick contacts.  ED Course: In the emergency department is afebrile hemodynamically stable and not hypoxic. He was evaluated by neurology who recommended medicine admission with repeat CT in 24 hours as he has a pacemaker     Assessment & Plan:   Principal Problem:   Stroke [facility]) Active Problems:   COPD (chronic obstructive pulmonary disease) ([place])   HLD (hyperlipidemia)   Benign essential HTN   Ataxia due to recent stroke   Cerebrovascular accident (CVA) ([employer])   BPH (benign prostatic hyperplasia)   GERD (gastroesophageal reflux disease)   CKD (chronic kidney disease), stage II   Coronary artery disease involving native coronary artery of native heart without angina pectoris   Dementia without behavioral  disturbance   Chronic anticoagulation   Diastolic dysfunction   Hemiparesis and other late effects of cerebrovascular accident ([place])   Seizures ([place])   Pressure injury of skin  Seizure episode, less likely TIA;  Neurology think episode was related to seizure.  Keppra increase to 750 mg BID>  Continue with eliquis.  EEG  ECHO  LDL-62  HgbA1c5.7 Awaiting therapy evaluation.  Sleepy this am, received ativan last night. I would avoid benzos. Added low dose Seroquel PRN Will need SNF  HTN; BP controlled.   Chronic diastolic Dysfunction; appears compensated.   CKD stage III; monitor.   Seizure; increased  keppra.   COPD; stable.   Dementia; continue with Seroquel.   Leukocytosis;  Received steroid injection.  Follow trend  Follow urine culture.   DVT prophylaxis: Eliquis  Code Status: DNR Family Communication:  Disposition Plan: to be determine.   Consultants:   Neurology    Procedures:   EEG;    Antimicrobials:     Subjective: He is sleepy today. Received ativan last night   Objective: Vitals:   01/08/17 2100 01/09/17 0100 01/09/17 0500 01/09/17 1023  BP: 124/68 103/71 118/76 114/79  Pulse: 72 67 78 68  Resp: 16 16 18 18   Temp: 97.8 F (36.6 C) 98 F (36.7 C) 97.7 F (36.5 C) 97.6 F (36.4 C)  TempSrc: Oral Oral Oral Axillary  SpO2: 98% 96% 97% 96%  Weight:      Height:        Intake/Output Summary (Last 24 hours) at 01/09/2017 1521 Last data filed at 01/09/2017 0800 Gross per 24 hour  Intake -  Output 1900 ml  Net -1900  ml   Filed Weights   01/07/17 2102  Weight: 74.3 kg (163 lb 12.8 oz)    Examination:  General exam: NAD Respiratory system: CTA Cardiovascular system: S 1, S 2 RRR Gastrointestinal system: BS present, soft, nt Central nervous system: Alert .  No focal neurological deficits. Extremities: Symmetric 5 x 5 power.      Data Reviewed: I have personally reviewed following labs and imaging  studies  CBC: Recent Labs  Lab 01/07/17 1332 01/07/17 1339 01/09/17 0420  WBC 13.9*  --  13.2*  NEUTROABS 11.3*  --   --   HGB 13.9 14.6 12.5*  HCT 40.6 43.0 38.4*  MCV 90.4  --  92.3  PLT 381  --  914   Basic Metabolic Panel: Recent Labs  Lab 01/07/17 1332 01/07/17 1339 01/09/17 0420  NA 133* 136 138  K 3.8 3.9 4.2  CL 102 102 109  CO2 18*  --  21*  GLUCOSE 116* 117* 106*  BUN 18 21* 17  CREATININE 1.12 1.00 0.98  CALCIUM 9.3  --  8.9   GFR: Estimated Creatinine Clearance: 47.9 mL/min (by C-G formula based on SCr of 0.98 mg/dL). Liver Function Tests: Recent Labs  Lab 01/07/17 1332  AST 43*  ALT 23  ALKPHOS 86  BILITOT 0.9  PROT 7.0  ALBUMIN 3.7   No results for input(s): LIPASE, AMYLASE in the last 168 hours. No results for input(s): AMMONIA in the last 168 hours. Coagulation Profile: Recent Labs  Lab 01/07/17 1332  INR 1.47   Cardiac Enzymes: No results for input(s): CKTOTAL, CKMB, CKMBINDEX, TROPONINI in the last 168 hours. BNP (last 3 results) No results for input(s): PROBNP in the last 8760 hours. HbA1C: Recent Labs    01/07/17 1332  HGBA1C 5.7*   CBG: Recent Labs  Lab 01/07/17 1328  GLUCAP 113*   Lipid Profile: Recent Labs    01/07/17 1332  CHOL 115  HDL 38*  LDLCALC 62  TRIG 77  CHOLHDL 3.0   Thyroid Function Tests: No results for input(s): TSH, T4TOTAL, FREET4, T3FREE, THYROIDAB in the last 72 hours. Anemia Panel: No results for input(s): VITAMINB12, FOLATE, FERRITIN, TIBC, IRON, RETICCTPCT in the last 72 hours. Sepsis Labs: No results for input(s): PROCALCITON, LATICACIDVEN in the last 168 hours.  No results found for this or any previous visit (from the past 240 hour(s)).       Radiology Studies: Mr Brain Wo Contrast  Result Date: 01/08/2017 CLINICAL DATA:  Stroke followup. EXAM: MRI HEAD WITHOUT CONTRAST TECHNIQUE: Multiplanar, multiecho pulse sequences of the brain and surrounding structures were obtained without  intravenous contrast. COMPARISON:  CT 01/07/2017 FINDINGS: Brain: Moderate atrophy without hydrocephalus. Negative for acute infarct. Moderate chronic ischemic changes throughout the cerebral white matter bilaterally. Small chronic infarct in the right parietal cortex. Negative for hemorrhage or mass. Vascular: Normal arterial flow voids Skull and upper cervical spine: Negative Sinuses/Orbits: Negative Other: None IMPRESSION: Negative for acute infarct. Moderate atrophy and chronic microvascular ischemia. Electronically Signed   By: Franchot Gallo M.D.   On: 01/08/2017 13:07   Dg Chest Port 1 View  Result Date: 01/07/2017 CLINICAL DATA:  Confusion and combative.  Possible stroke. EXAM: PORTABLE CHEST 1 VIEW COMPARISON:  10/24/2016. FINDINGS: Stable cardiomegaly with tortuous thoracic calcified aorta. Left-sided pacemaker apparatus with right atrial and right ventricular leads are again noted. Mild subpleural areas of interstitial lung disease likely related to fibrosis. No pneumonic consolidation or overt pulmonary edema. The left costophrenic angle is excluded  on this study. IMPRESSION: 1. Cardiomegaly with aortic atherosclerosis. 2. Subpleural interstitial changes consistent with pulmonary interstitial fibrosis. 3. No overt pulmonary edema nor acute pneumonic consolidations. Electronically Signed   By: Ashley Royalty M.D.   On: 01/07/2017 22:20        Scheduled Meds: .  stroke: mapping our early stages of recovery book   Does not apply Once  . apixaban  5 mg Oral BID  . aspirin EC  81 mg Oral Daily  . atorvastatin  80 mg Oral q1800  . levETIRAcetam  750 mg Oral BID  . LORazepam  0.25 mg Intravenous Once  . QUEtiapine  25 mg Oral QHS   Continuous Infusions:   LOS: 2 days    Time spent: 35 minutes.     Elmarie Shiley, MD [facility] Pager [phone]  If 7PM-7AM, please contact night-coverage [email or web address] Password [email or web address] 01/09/2017, 3:21 PM

## 2017-01-09 NOTE — Care Management Note (Signed)
Case Management Note  Patient Details  Name: KASTON FAUGHN MRN: [id number] Date of Birth: 1928-02-24  Subjective/Objective:                    Action/Plan: Per notes, CIR is recommending SNF vs Home with HH. CM met with the patient, his wife and daughter. They are interested in SNF. CM educated on the [facility] First program but the patients wife does not feel she can manage with him at home at this time.  CSW updated that they would like to be faxed out in the [facility] area and their first choice is [facility] [place].  CM following.  Expected Discharge Date:                  Expected Discharge Plan:  Skilled Nursing Facility  In-House Referral:  Clinical Social Work  Discharge planning Services  CM Consult  Post Acute Care Choice:    Choice offered to:     DME Arranged:    DME Agency:     HH Arranged:    [place] Agency:     Status of Service:  In process, will continue to follow  If discussed at Long Length of Stay Meetings, dates discussed:    Additional Comments:  Pollie Friar, RN 01/09/2017, 1:45 PM

## 2017-01-09 NOTE — Progress Notes (Signed)
Physical Therapy Treatment Patient Details Name: Herbert Marquez MRN: [id number] DOB: [birth year] Today's Date: 01/09/2017    History of Present Illness 83 y.o. male with history of prio CVAs, seizure disorder, hypertension, diabetes, atrial fibrillation, diastolic dysfunction, COPD, CAD, dementia  in the past with residual right sided weakness and only ability to walk short distances. He has no problems talking at baseline. Pt presents to ED with global aphasia and SOB. MRI negative for acute infarcts.    PT Comments    Pt was seen when wife sought out therapy to help with transfer to [facility]. Pt had markedly increased weakness since last session. Pt maxA for bed mobility, and sit<>stand and squat pivot transfers. Pt requires maximal verbal and tactile cuing today for sequencing of transfer and follows commands inconsistently. Pt family is still very optimistic about CIR placement, however not sure if pt is able to participate for required duration of CIR sessions. Pt requires continued skilled PT in the acute setting to work on strengthening, and mobility to prepare for discharge.      Follow Up Recommendations  CIR     Equipment Recommendations  None recommended by PT    Recommendations for Other Services Rehab consult     Precautions / Restrictions Precautions Precautions: Fall Restrictions Weight Bearing Restrictions: No    Mobility  Bed Mobility Overal bed mobility: Needs Assistance Bed Mobility: Supine to Sit     Supine to sit: Mod assist;HOB elevated;Max assist     General bed mobility comments: modA for trunk to upright, pt able to slide legs off of bed, required maxA to scoot to EoB   Transfers Overall transfer level: Needs assistance Equipment used: 1 person hand held assist Transfers: Sit to/from [employer] Sit to Stand: Max assist   Squat pivot transfers: Max assist     General transfer comment: pt first attempted sit to stand to take steps  towards BSC, pt required maxA and was never able to come to fully upright despite maximal verbal and tactile cuing, pt markedly weaker today, on second attempt for squat pivot transfer pt was maxA for power up to clear hips to BSC, maximal verbal cues for hand placement on therapists arms for stability, continually grasped for bed rail and would not let go.      Balance Overall balance assessment: Needs assistance Sitting-balance support: No upper extremity supported;Feet supported Sitting balance-Leahy Scale: Fair Sitting balance - Comments: able to sit on bedside commode without LoB                                    Cognition Arousal/Alertness: Awake/alert Behavior During Therapy: WFL for tasks assessed/performed Overall Cognitive Status: History of cognitive impairments - at baseline Area of Impairment: Attention;Following commands;Awareness;Problem solving                   Current Attention Level: Sustained   Following Commands: Follows one step commands inconsistently;Follows one step commands with increased time   Awareness: Intellectual Problem Solving: Difficulty sequencing;Requires verbal cues;Requires tactile cues;Slow processing General Comments: Pt with baseline dementia. However, pt demonstrating decreased cognititon compared to baseline and required increased cues and time. Pt very talkative this morning about his past but had increased difficulty with following cues for tasks at hand.          General Comments General comments (skin integrity, edema, etc.): Wife present during session. PT called into  room to help with transfer to [facility], during transfer it was apparent that pt had pocketed a large amount of food and was continually chewing throughout session. Pt wife states that this is his normal.       Pertinent Vitals/Pain Pain Assessment: Faces Faces Pain Scale: No hurt Pain Intervention(s): Monitored during session;Limited activity within  patient's tolerance    Home Living Family/patient expects to be discharged to:: Private residence Living Arrangements: Spouse/significant other Available Help at Discharge: Family;Available 24 hours/day Type of Home: House Home Access: Ramped entrance   Home Layout: One level Home Equipment: Walker - 2 wheels;Cane - single point;Wheelchair - Banker;Shower seat;Grab bars - tub/shower Additional Comments: Spent a long time talking with pt and familty about HH services to optimize funcitonal performance and safety    Prior Function Level of Independence: Independent with assistive device(s)      Comments: Pt was primarily w/c level PTA. He was independent with short funcitonal mobility with RW and wife providing overall supervision and set-up. She reports he could dress and bathe himself but sometimes she would assist for time management. Pt using w/c in the home for mobility but was walking with HHPT and wife providing assistance with RW.   PT Goals (current goals can now be found in the care plan section) Acute Rehab PT Goals Patient Stated Goal: Get stronger before going home PT Goal Formulation: With patient/family Time For Goal Achievement: 01/22/17 Potential to Achieve Goals: Fair Progress towards PT goals: Not progressing toward goals - comment(Pt with increased generalized weakness during treatment )    Frequency    Min 4X/week      PT Plan Current plan remains appropriate       AM-PAC PT "6 Clicks" Daily Activity  Outcome Measure  Difficulty turning over in bed (including adjusting bedclothes, sheets and blankets)?: Unable Difficulty moving from lying on back to sitting on the side of the bed? : Unable Difficulty sitting down on and standing up from a chair with arms (e.g., wheelchair, bedside commode, etc,.)?: Unable Help needed moving to and from a bed to chair (including a wheelchair)?: A Lot Help needed walking in hospital room?: Total Help  needed climbing 3-5 steps with a railing? : Total 6 Click Score: 7    End of Session Equipment Utilized During Treatment: Gait belt Activity Tolerance: Other (comment)(Pt limited by increased weakness) Patient left: with call bell/phone within reach;with family/visitor present;in bed;with bed alarm set;with SCD's reapplied Nurse Communication: Mobility status PT Visit Diagnosis: Unsteadiness on feet (R26.81);Other abnormalities of gait and mobility (R26.89);Muscle weakness (generalized) (M62.81);Difficulty in walking, not elsewhere classified (R26.2);Other symptoms and signs involving the nervous system (R29.898);History of falling (Z91.81);Hemiplegia and hemiparesis Hemiplegia - Right/Left: Right Hemiplegia - dominant/non-dominant: Dominant Hemiplegia - caused by: Unspecified(previous cerebral infarct)     Time: 0272-5366 PT Time Calculation (min) (ACUTE ONLY): 20 min  Charges:  $Therapeutic Activity: 8-22 mins                    G Codes:       Oleva Koo B. Migdalia Dk PT, DPT Acute Rehabilitation  [phone] Pager [phone]     [place] 01/09/2017, 10:16 AM

## 2017-01-09 NOTE — Progress Notes (Signed)
  Speech Language Pathology Treatment: Cognitive-Linquistic  Patient Details Name: Herbert Marquez MRN: [id number] DOB: [birth year] Today's Date: 01/09/2017 Time: 9735-3299 SLP Time Calculation (min) (ACUTE ONLY): 38 min  Assessment / Plan / Recommendation Clinical Impression  Pt seen today for diagnostic tx of speech, language and cognition; further evaluation shows that the pt presents with moderate cognitive deficits in the areas of working memory and executive functioning (including reasoning/judgement); when completing subtests on the Cognistat, pt would often respond inappropriately or demonstrate confusion of task directions. Results of evaluation possibly complicated by hearing loss; hearing aids not present. Mild deficits in articulatory precision noted, though pt was intelligible throughout the session; pt's daughter reports that his speech is "more jumbled" than his baseline. Pt has adequate help at home for ADLs; wife handles all finances, cooking and medications at home. Wife and daughter report that pt will d/c to SNF; will continued to follow acutely for ST targeting executive functioning and working memory.   Pt reported to demonstrate oral pocketing with meals. SLP educated pt and family on swallowing safety and advised them to check for pocketing after meals; sign was hung over the bed to reinforce with family and nursing staff.   HPI HPI: Herbert Marquez an 83 y.o.malewith history ofprio CVAs,seizure disorder, hypertension, diabetes, atrial fibrillation, diastolic dysfunction, COPD, CAD, dementia in the past with residual right sided weakness and only ability to walk short distances. He has no problems talking at baseline. Pt presents to ED with global aphasia and SOB. CT head 11/5 showed no acute bleed but had lots of movement. Patient was not a tPA candidate due to [facility]. MRI pending. CXR (1 view) 11/5 shows subpleural interstitial changes consistent with pulmonary interstitial  fibrosis and no overt pulmonary edema nor acute pneumonic consolidations.      SLP Plan  Continue with current plan of care       Recommendations                   Follow up Recommendations: Skilled Nursing facility SLP Visit Diagnosis: Frontal lobe and executive function deficit Frontal lobe and executive function deficit following: Cerebral infarction Plan: Continue with current plan of care       GO                Aaron Edelman, Student SLP 01/09/2017, 2:29 PM

## 2017-01-09 NOTE — Consult Note (Signed)
Physical Medicine and Rehabilitation Consult Reason for Consult: Global aphasia with decreased functional mobility Referring Physician: [facility]   HPI: Herbert Marquez is a 83 y.o. right handed male with history of seizure disorder maintained on Keppra, diastolic congestive heart failure, CAD status post PCI of RCA in 2006 with pacemaker, hypertension, CVA left thalamic and bilateral PCA and received inpatient rehabilitation services April 2018 as well as January 2018 for cerebral hemorrhage with SDH status post IV TPA and April 2017 for left thalamic infarct with residual right sided weakness and patient remains on aspirin and Eliquis for CVA prophylaxis. Per chart review patient lives with spouse independent with assistive device prior to admission. One level home with ramped entrance. Wife reported he could dress and bathe himself. He was receiving home health therapies. Presented 01/07/2017 with aphasia as well as agitation with question of seizure. Patient earlier in the day he had received injection in the knee for arthritis. Cranial CT scan severely motion degraded exam no visible acute hemorrhage. No large territorial infarct or mass effect. Patient did not receive TPA. 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 No intracranial arterial occlusion or high-grade stenosis. 55% stenosis of the proximal right internal carotid artery. MRI negative for acute infarction. Moderate atrophy and chronic microvascular ischemia. Echocardiogram with ejection fraction of 71% grade 1 diastolic dysfunction. EEG with diffuse cerebral dysfunction no seizure activity. Neurology follow-up patient remains on aspirin and Eliquis as prior to admission. Clinical course felt likely seizure episode with MRI being negative. Patient's Keppra was increased from 500 mg twice daily to 750 mg twice a day. Tolerating a regular diet.    Patient slept poorly last night.  He does have a history of sundowning and is on chronic Seroquel at home.  He reportedly got his medications a bit late.  By that time he was agitated.  He then received IV Ativan.  Patient having difficulty with constipation this morning.  Improved right knee pain status post corticosteroid injection approximately 2 days ago   Review of Systems  Constitutional: Negative for chills and fever.  HENT: Negative for hearing loss.   Eyes: Negative for blurred vision and double vision.  Respiratory: Negative for cough and shortness of breath.   Cardiovascular: Positive for palpitations. Negative for chest pain and leg swelling.  Gastrointestinal: Positive for constipation. Negative for nausea and vomiting.       GERD  Genitourinary: Positive for frequency and urgency.  Musculoskeletal: Positive for joint pain and myalgias.  Skin: Negative for rash.  Neurological: Positive for speech change and seizures.  Psychiatric/Behavioral: Positive for memory loss.  All other systems reviewed and are negative.  Past Medical History:  Diagnosis Date  . Arthritis    "pretty much all over"   . Basal cell carcinoma    "several burned off his body, face, head"  . BPH (benign prostatic hypertrophy)   . Coronary artery disease    a. s/p PCI of RCA in 2006  . CVA (cerebral infarction)    a. 06/2015: left thalamic and bilateral PCA  . GERD (gastroesophageal reflux disease)   . Hyperlipidemia   . Hypertension   . Presence of permanent cardiac pacemaker   . Stroke ([place])   . TIA (transient ischemic attack)    Approximately 6 weeks post-cardiac catheterization.    Past Surgical History:  Procedure Laterality Date  . CARDIOVASCULAR  STRESS TEST  07/01/2007   EF 74%  . CATARACT EXTRACTION, BILATERAL    . CORONARY ANGIOPLASTY WITH STENT PLACEMENT  10/2004   stenting x 2 to RCA  . FRACTURE SURGERY    . HERNIA REPAIR    . INSERT / REPLACE / REMOVE PACEMAKER  10/23/2016   . LAPAROSCOPIC INCISIONAL / UMBILICAL / VENTRAL HERNIA REPAIR     "below his naval"   Family History  Problem Relation Age of Onset  . Hypertension Mother   . Lung cancer Father   . Lung cancer Brother    Social History:  reports that he quit smoking about 45 years ago. he has never used smokeless tobacco. He reports that he drinks about 4.8 oz of alcohol per week. He reports that he does not use drugs. Allergies: No Known Allergies Medications Prior to Admission  Medication Sig Dispense Refill  . apixaban (ELIQUIS) 5 MG TABS tablet Take 1 tablet (5 mg total) by mouth 2 (two) times daily. Resume 10/25/16 evening dose 60 tablet 1  . aspirin EC 81 MG tablet Take 1 tablet (81 mg total) by mouth daily. (Patient taking differently: Take 81 mg every evening by mouth. )    . atorvastatin (LIPITOR) 80 MG tablet TAKE 1 TABLET BY MOUTH DAILY. 90 tablet 0  . levETIRAcetam (KEPPRA) 500 MG tablet Take 1 tablet (500 mg total) by mouth 2 (two) times daily. 180 tablet 1  . Multiple Vitamin (MULTIVITAMIN WITH MINERALS) TABS tablet Take 1 tablet by mouth daily.    . nitroGLYCERIN (NITROSTAT) 0.4 MG SL tablet Place 1 tablet (0.4 mg total) under the tongue every 5 (five) minutes as needed for chest pain. 25 tablet 5  . Phenazopyridine HCl (AZO TABS PO) Take 1 tablet 3 (three) times daily by mouth.    . polyvinyl alcohol (ARTIFICIAL TEARS) 1.4 % ophthalmic solution Place 1 drop into both eyes daily as needed for dry eyes.    [name] QUEtiapine (SEROQUEL) 25 MG tablet TAKE 1 TABLET BY MOUTH AT BEDTIME. 30 tablet 2    Home: Home Living Family/patient expects to be discharged to:: Private residence Living Arrangements: Spouse/significant other Available Help at Discharge: Family, Available 24 hours/day Type of Home: House Home Access: Ramped entrance [place]: One level Bathroom Shower/Tub: Multimedia programmer: Handicapped height Bathroom Accessibility: Yes Home Equipment: Environmental consultant - 2 wheels, [place]  - single point, Wheelchair - manual, Systems analyst, Shower seat, Grab bars - tub/shower Additional Comments: Spent a long time talking with pt and familty about [place] services to optimize funcitonal performance and safety  Lives With: Spouse  Functional History: Prior Function Level of Independence: Independent with assistive device(s) Comments: Pt was primarily w/c level PTA. He was independent with short funcitonal mobility with RW and wife providing overall supervision and set-up. She reports he could dress and bathe himself but sometimes she would assist for time management. Pt using w/c in the home for mobility but was walking with HHPT and wife providing assistance with RW. Functional Status:  Mobility: Bed Mobility Overal bed mobility: Needs Assistance Bed Mobility: Supine to Sit Supine to sit: Mod assist, +2 for physical assistance, HOB elevated General bed mobility comments: Pt requiring Max tactile, verbal, and visual cues to sequence bed mobility and use bedrails. Pt requiring Mod A +2 to manage BLEs and bring hips toward EOB with pad. Pt able to elevate trunk with cues to push through LUE Transfers Overall transfer level: Needs assistance Equipment used: Rolling walker (2 wheeled) Transfers:  Sit to/from Stand Sit to Stand: +2 physical assistance, Min assist, Mod assist General transfer comment: Pt requiring Min A for first sit <>stand using RW. As pt fatigued, pt required ModA+2 to control descent to recliner and Max VCs for safe sequencing. Ambulation/Gait Ambulation/Gait assistance: Mod assist, +2 physical assistance Ambulation Distance (Feet): 12 Feet Assistive device: Rolling walker (2 wheeled) Gait Pattern/deviations: Step-through pattern, Decreased weight shift to right, Decreased stride length, Shuffle, Antalgic, Staggering right General Gait Details: modAx2 for ambulation, maximal vc for sequencing, increased knee flexion with R LE advancement, upright posture Gait  velocity: slowed Gait velocity interpretation: Below normal speed for age/gender    ADL: ADL Overall ADL's : Needs assistance/impaired Eating/Feeding: Set up, Sitting Eating/Feeding Details (indicate cue type and reason): supported sitting Grooming: Min guard, Sitting Grooming Details (indicate cue type and reason): Pt able to maintain static sitting at EOB to perform grooming. As pt fatigued, pt presents with posterior and lateral R lean.  Upper Body Bathing: Cueing for sequencing, Sitting, Moderate assistance Lower Body Bathing: Maximal assistance, Sit to/from stand, Cueing for sequencing Upper Body Dressing : Sitting, Moderate assistance, Cueing for sequencing Upper Body Dressing Details (indicate cue type and reason): Pt requiring Mod A and Max cues to don gown like a jacket.  Lower Body Dressing: Maximal assistance, Sit to/from stand, Cueing for sequencing Toilet Transfer: Moderate assistance, Maximal assistance, +2 for physical assistance, Ambulation, RW(Simulated to recliner) Toilet Transfer Details (indicate cue type and reason): Pt requiring Mod A + 2 for initial ambulation. As pt fatgiued, his lateral R lean became more significant and he required Max cues to bring RLE forward. Pt demonstrating increased confusion and  Functional mobility during ADLs: Moderate assistance, Maximal assistance, +2 for physical assistance, Rolling walker, Cueing for sequencing(Pt requiring Mod A +2 for funcitonal mobility with RW. General ADL Comments: Pt demonstrating decreased funcitonal performance compared to baseline. Pt with increase weakness and confusion. Pt requiring Mod A for UB ADLs and Max A for LB ADLs.   Cognition: Cognition Overall Cognitive Status: History of cognitive impairments - at baseline Arousal/Alertness: Awake/alert Orientation Level: Oriented to place, Oriented to person Attention: Sustained Sustained Attention: Appears intact Memory: Appears intact Executive Function:  Initiating Initiating: Appears intact Safety/Judgment: Appears intact Comments: SLE Limited Cognition Arousal/Alertness: Awake/alert Behavior During Therapy: WFL for tasks assessed/performed Overall Cognitive Status: History of cognitive impairments - at baseline Area of Impairment: Attention, Following commands, Awareness Current Attention Level: Sustained Following Commands: Follows one step commands inconsistently, Follows one step commands with increased time Awareness: Intellectual General Comments: Pt with baseline dementia. However, pt demonstrating decreased cognititon compared to baseline and required increased cues and time. At bengining of session, pt joking and able to asker questions correctly (with confirmation by wife). As session progressed, pt requiring increased cues for sequencing and safety and demonstrated increased confusion. Feel pt fatiigued quickly.   Blood pressure 118/76, pulse 78, temperature 97.7 F (36.5 C), temperature source Oral, resp. rate 18, height 5\' 6"  (1.676 m), weight 74.3 kg (163 lb 12.8 oz), SpO2 97 %. Physical Exam  Vitals reviewed. HENT:  Head: Normocephalic.  Eyes:  Pupils reactive to light  Neck: Normal range of motion. Neck supple. No thyromegaly present.  Cardiovascular: Normal rate and regular rhythm.  Respiratory: Effort normal and breath sounds normal. No respiratory distress.  GI: Soft. Bowel sounds are normal. He exhibits no distension.  Neurological:  Patient is sedated but arousable. He just received medication for agitation. He did make eye contact with examiner.  He could provide his wife's name. Pleasantly confused. Follows some simple commands  Skin: Skin is warm and dry.  Oriented to person, time, place.  Speech is mildly dysarthric Motor strength is 5/5 in the left deltoid, bicep, tricep, grip, hip flexor, knee extensor ankle dorsiflexor 4/5 in the right deltoid, bicep, tricep, grip, hip flexor, knee extensor ankle  dorsiflexor Sensation difficult to assess secondary to poor attention and concentration to task.  Results for orders placed or performed during the hospital encounter of 01/07/17 (from the past 24 hour(s))  Urinalysis, Routine w reflex microscopic     Status: Abnormal   Collection Time: 01/08/17  7:53 PM  Result Value Ref Range   Color, Urine YELLOW YELLOW   APPearance CLEAR CLEAR   Specific Gravity, Urine 1.016 1.005 - 1.030   pH 6.0 5.0 - 8.0   Glucose, UA NEGATIVE NEGATIVE mg/dL   Hgb urine dipstick SMALL (A) NEGATIVE   Bilirubin Urine NEGATIVE NEGATIVE   Ketones, ur NEGATIVE NEGATIVE mg/dL   Protein, ur NEGATIVE NEGATIVE mg/dL   Nitrite NEGATIVE NEGATIVE   Leukocytes, UA NEGATIVE NEGATIVE   RBC / HPF 6-30 0 - 5 RBC/hpf   WBC, UA 0-5 0 - 5 WBC/hpf   Bacteria, UA RARE (A) NONE SEEN   Squamous Epithelial / LPF 0-5 (A) NONE SEEN   Hyaline Casts, UA PRESENT   CBC     Status: Abnormal   Collection Time: 01/09/17  4:20 AM  Result Value Ref Range   WBC 13.2 (H) 4.0 - 10.5 K/uL   RBC 4.16 (L) 4.22 - 5.81 MIL/uL   Hemoglobin 12.5 (L) 13.0 - 17.0 g/dL   HCT 38.4 (L) 39.0 - 52.0 %   MCV 92.3 78.0 - 100.0 fL   MCH 30.0 26.0 - 34.0 pg   MCHC 32.6 30.0 - 36.0 g/dL   RDW 14.2 11.5 - 15.5 %   Platelets 366 150 - 400 K/uL  Basic metabolic panel     Status: Abnormal   Collection Time: 01/09/17  4:20 AM  Result Value Ref Range   Sodium 138 135 - 145 mmol/L   Potassium 4.2 3.5 - 5.1 mmol/L   Chloride 109 101 - 111 mmol/L   CO2 21 (L) 22 - 32 mmol/L   Glucose, Bld 106 (H) 65 - 99 mg/dL   BUN 17 6 - 20 mg/dL   Creatinine, Ser 0.98 0.61 - 1.24 mg/dL   Calcium 8.9 8.9 - 10.3 mg/dL   GFR calc non Af Amer >60 >60 mL/min   GFR calc Af Amer >60 >60 mL/min   Anion gap 8 5 - 15   Ct Angio Head W Or Wo Contrast  Result Date: 01/07/2017 CLINICAL DATA:  Aphasia EXAM: CT ANGIOGRAPHY HEAD AND NECK TECHNIQUE: Multidetector CT imaging of the head and neck was performed using the standard protocol  during bolus administration of intravenous contrast. Multiplanar CT image reconstructions and MIPs were obtained to evaluate the vascular anatomy. Carotid stenosis measurements (when applicable) are obtained utilizing NASCET criteria, using the distal internal carotid diameter as the denominator. CONTRAST:  50 mL Isovue 370 COMPARISON:  Head CT same date Brain MRI 06/07/2016 FINDINGS: CTA NECK FINDINGS Aortic arch: There is moderate calcific atherosclerosis of the aortic arch. There is no aneurysm, dissection or hemodynamically significant stenosis of the visualized ascending aorta and aortic arch. Conventional 3 vessel aortic branching pattern. The visualized proximal subclavian arteries are normal. Right carotid system: The right common carotid origin is widely patent. There is no  common carotid or internal carotid artery dissection or aneurysm. There is mixed calcified and noncalcified plaque at the right carotid bifurcation the results in 55% stenosis. The right internal carotid artery is widely patent to the skullbase. Left carotid system: The left common carotid origin is widely patent. There is no common carotid or internal carotid artery dissection or aneurysm. Atherosclerotic calcification at the carotid bifurcation without hemodynamically significant stenosis. Vertebral arteries: The vertebral system is left dominant. Both vertebral artery origins are normal. The right vertebral artery is occluded at the V3 segment, new compared to 06/07/2016. Skeleton: Multilevel moderate to severe facet arthrosis. Grade 1 C7-T1 anterolisthesis Other neck: The nasopharynx is clear. The oropharynx and hypopharynx are normal. The epiglottis is normal. The supraglottic larynx, glottis and subglottic larynx are normal. No retropharyngeal collection. The parapharyngeal spaces are preserved. The parotid and submandibular glands are normal. No sialolithiasis or salivary ductal dilatation. The thyroid gland is normal. There is no  cervical lymphadenopathy. Upper chest: Bilateral upper lobe scarring and emphysema. Review of the MIP images confirms the above findings CTA HEAD FINDINGS Anterior circulation: --Intracranial internal carotid arteries: Non stenotic calcific atherosclerosis both internal carotid arteries at the skullbase. --Anterior cerebral arteries: Normal. --Middle cerebral arteries: Normal. --Posterior communicating arteries: Absent bilaterally. Posterior circulation: --Posterior cerebral arteries: Normal. --Superior cerebellar arteries: Normal. --Basilar artery: Normal. --Anterior inferior cerebellar arteries: Normal. --Posterior inferior cerebellar arteries: Diminished enhancement of the right PICA. Venous sinuses: As permitted by contrast timing, patent. Anatomic variants: None Delayed phase: Not performed. Review of the MIP images confirms the above findings IMPRESSION: 1. 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 2. No intracranial arterial occlusion or high-grade stenosis. 3. 55% stenosis of the proximal right internal carotid artery. 4. Aortic Atherosclerosis (ICD10-I70.0) and Emphysema (ICD10-J43.9). Critical Value/emergent results were called by telephone at the time of interpretation on 01/07/2017 at 2:48 pm to Dr. Johnney Killian, who verbally acknowledged these results. Electronically Signed   By: Ulyses Jarred M.D.   On: 01/07/2017 14:49   Ct Angio Neck W Or Wo Contrast  Result Date: 01/07/2017 CLINICAL DATA:  Aphasia EXAM: CT ANGIOGRAPHY HEAD AND NECK TECHNIQUE: Multidetector CT imaging of the head and neck was performed using the standard protocol during bolus administration of intravenous contrast. Multiplanar CT image reconstructions and MIPs were obtained to evaluate the vascular anatomy. Carotid stenosis measurements (when applicable) are obtained utilizing NASCET criteria, using the distal internal carotid diameter as the denominator. CONTRAST:  50 mL  Isovue 370 COMPARISON:  Head CT same date Brain MRI 06/07/2016 FINDINGS: CTA NECK FINDINGS Aortic arch: There is moderate calcific atherosclerosis of the aortic arch. There is no aneurysm, dissection or hemodynamically significant stenosis of the visualized ascending aorta and aortic arch. Conventional 3 vessel aortic branching pattern. The visualized proximal subclavian arteries are normal. Right carotid system: The right common carotid origin is widely patent. There is no common carotid or internal carotid artery dissection or aneurysm. There is mixed calcified and noncalcified plaque at the right carotid bifurcation the results in 55% stenosis. The right internal carotid artery is widely patent to the skullbase. Left carotid system: The left common carotid origin is widely patent. There is no common carotid or internal carotid artery dissection or aneurysm. Atherosclerotic calcification at the carotid bifurcation without hemodynamically significant stenosis. Vertebral arteries: The vertebral system is left dominant. Both vertebral artery origins are normal. The right vertebral artery is occluded at the V3 segment, new compared to  06/07/2016. Skeleton: Multilevel moderate to severe facet arthrosis. Grade 1 C7-T1 anterolisthesis Other neck: The nasopharynx is clear. The oropharynx and hypopharynx are normal. The epiglottis is normal. The supraglottic larynx, glottis and subglottic larynx are normal. No retropharyngeal collection. The parapharyngeal spaces are preserved. The parotid and submandibular glands are normal. No sialolithiasis or salivary ductal dilatation. The thyroid gland is normal. There is no cervical lymphadenopathy. Upper chest: Bilateral upper lobe scarring and emphysema. Review of the MIP images confirms the above findings CTA HEAD FINDINGS Anterior circulation: --Intracranial internal carotid arteries: Non stenotic calcific atherosclerosis both internal carotid arteries at the skullbase.  --Anterior cerebral arteries: Normal. --Middle cerebral arteries: Normal. --Posterior communicating arteries: Absent bilaterally. Posterior circulation: --Posterior cerebral arteries: Normal. --Superior cerebellar arteries: Normal. --Basilar artery: Normal. --Anterior inferior cerebellar arteries: Normal. --Posterior inferior cerebellar arteries: Diminished enhancement of the right PICA. Venous sinuses: As permitted by contrast timing, patent. Anatomic variants: None Delayed phase: Not performed. Review of the MIP images confirms the above findings IMPRESSION: 1. 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 2. No intracranial arterial occlusion or high-grade stenosis. 3. 55% stenosis of the proximal right internal carotid artery. 4. Aortic Atherosclerosis (ICD10-I70.0) and Emphysema (ICD10-J43.9). Critical Value/emergent results were called by telephone at the time of interpretation on 01/07/2017 at 2:48 pm to Dr. Johnney Killian, who verbally acknowledged these results. Electronically Signed   By: Ulyses Jarred M.D.   On: 01/07/2017 14:49   Mr Brain Wo Contrast  Result Date: 01/08/2017 CLINICAL DATA:  Stroke followup. EXAM: MRI HEAD WITHOUT CONTRAST TECHNIQUE: Multiplanar, multiecho pulse sequences of the brain and surrounding structures were obtained without intravenous contrast. COMPARISON:  CT 01/07/2017 FINDINGS: Brain: Moderate atrophy without hydrocephalus. Negative for acute infarct. Moderate chronic ischemic changes throughout the cerebral white matter bilaterally. Small chronic infarct in the right parietal cortex. Negative for hemorrhage or mass. Vascular: Normal arterial flow voids Skull and upper cervical spine: Negative Sinuses/Orbits: Negative Other: None IMPRESSION: Negative for acute infarct. Moderate atrophy and chronic microvascular ischemia. Electronically Signed   By: Franchot Gallo M.D.   On: 01/08/2017 13:07   Dg Chest Port 1  View  Result Date: 01/07/2017 CLINICAL DATA:  Confusion and combative.  Possible stroke. EXAM: PORTABLE CHEST 1 VIEW COMPARISON:  10/24/2016. FINDINGS: Stable cardiomegaly with tortuous thoracic calcified aorta. Left-sided pacemaker apparatus with right atrial and right ventricular leads are again noted. Mild subpleural areas of interstitial lung disease likely related to fibrosis. No pneumonic consolidation or overt pulmonary edema. The left costophrenic angle is excluded on this study. IMPRESSION: 1. Cardiomegaly with aortic atherosclerosis. 2. Subpleural interstitial changes consistent with pulmonary interstitial fibrosis. 3. No overt pulmonary edema nor acute pneumonic consolidations. Electronically Signed   By: Ashley Royalty M.D.   On: 01/07/2017 22:20   Ct Head Code Stroke Wo Contrast  Result Date: 01/07/2017 CLINICAL DATA:  Code stroke.  Aphasia EXAM: CT HEAD WITHOUT CONTRAST TECHNIQUE: Contiguous axial images were obtained from the base of the skull through the vertex without intravenous contrast. COMPARISON:  Brain MRI 06/07/2016 FINDINGS: The examination is severely degraded by motion. Brain: No acute hemorrhage. There is advanced atrophy with findings of chronic microvascular ischemia. No midline shift or other mass effect. Vascular: Poor visualization due to motion. Skull: No skull fracture. Sinuses/Orbits: No acute finding. Other: None. ASPECTS [facility] Stroke Program Early CT Score) There is too much motion on the examination to provide a reliable ASPECTS rating. IMPRESSION: 1. Severely motion  degraded examination, but no visible acute hemorrhage. 2. ASPECTS is not able to be reliably assessed because of the degree of motion. However, there is no large territory infarct or mass effect. Dr. Lorraine Lax paged at 2:00 PM Electronically Signed   By: Ulyses Jarred M.D.   On: 01/07/2017 14:02    Assessment/Plan: Diagnosis: Episode of altered mental status without new CVA, possible seizure 1. Does the need  for close, 24 hr/day medical supervision in concert with the patient's rehab needs make it unreasonable for this patient to be served in a less intensive setting? No 2. Co-Morbidities requiring supervision/potential complications: Hypertension, history of sundowning, right knee osteoarthritis 3. Due to bladder management, bowel management, safety, skin/wound care, disease management, medication administration, pain management and patient education, does the patient require 24 hr/day rehab nursing? Potentially 4. Does the patient require coordinated care of a physician, rehab nurse, PT,OT to address physical and functional deficits in the context of the above medical diagnosis(es)? No Addressing deficits in the following areas: balance, endurance, locomotion, strength, transferring, dressing, toileting and psychosocial support 5. Can the patient actively participate in an intensive therapy program of at least 3 hrs of therapy per day at least 5 days per week? Potentially 6. The potential for patient to make measurable gains while on inpatient rehab is fair 7. Anticipated functional outcomes upon discharge from inpatient rehab are n/a  with PT, n/a with OT, n/a with SLP. 8. Estimated rehab length of stay to reach the above functional goals is: NA 9. Anticipated D/C setting: Home 10. Anticipated post D/C treatments: [place] therapy 11. Overall Rehab/Functional Prognosis: fair  RECOMMENDATIONS: This patient's condition is appropriate for continued rehabilitative care in the following setting: SNF versus home with home health Patient has agreed to participate in recommended program. Potentially Note that insurance prior authorization may be required for reimbursement for recommended care.  Comment: At this point no rehab diagnosis, I think his decline today is likely related to poor sleep and CNS depressant medications.  Also increased dose of Keppra may be contributing.  We will continue to follow while  patient is in acute care hospital.  If patient goes home will need physical medicine and rehab follow-up with myself in 2 weeks after discharge  Charlett Blake M.D. [facility] FAAPM&R (Sports Med, Neuromuscular Med) Diplomate Am Board of Electrodiagnostic Med  Cathlyn Parsons., PA-C 01/09/2017

## 2017-01-09 NOTE — Progress Notes (Addendum)
STROKE TEAM PROGRESS NOTE   SUBJECTIVE (INTERVAL HISTORY) His  daughter is at the bedside. No new events reported by nursing. Patient appears at his baseline per daughter. Patient voices no new complains. States slept well and has been working with therapies. Pending SNF  OBJECTIVE Recent Labs  Lab 01/07/17 1328  GLUCAP 113*   Recent Labs  Lab 01/07/17 1332 01/07/17 1339 01/09/17 0420  NA 133* 136 138  K 3.8 3.9 4.2  CL 102 102 109  CO2 18*  --  21*  GLUCOSE 116* 117* 106*  BUN 18 21* 17  CREATININE 1.12 1.00 0.98  CALCIUM 9.3  --  8.9   Recent Labs  Lab 01/07/17 1332  AST 43*  ALT 23  ALKPHOS 86  BILITOT 0.9  PROT 7.0  ALBUMIN 3.7   Recent Labs  Lab 01/07/17 1332 01/07/17 1339 01/09/17 0420  WBC 13.9*  --  13.2*  NEUTROABS 11.3*  --   --   HGB 13.9 14.6 12.5*  HCT 40.6 43.0 38.4*  MCV 90.4  --  92.3  PLT 381  --  366   No results for input(s): CKTOTAL, CKMB, CKMBINDEX, TROPONINI in the last 168 hours. Recent Labs    01/07/17 1332  LABPROT 17.7*  INR 1.47   Recent Labs    01/07/17 1504 01/08/17 1953  COLORURINE YELLOW YELLOW  LABSPEC 1.024 1.016  PHURINE 8.0 6.0  GLUCOSEU NEGATIVE NEGATIVE  HGBUR NEGATIVE SMALL*  BILIRUBINUR NEGATIVE NEGATIVE  KETONESUR 5* NEGATIVE  PROTEINUR NEGATIVE NEGATIVE  NITRITE NEGATIVE NEGATIVE  LEUKOCYTESUR NEGATIVE NEGATIVE       Component Value Date/Time   CHOL 115 01/07/2017 1332   TRIG 77 01/07/2017 1332   HDL 38 (L) 01/07/2017 1332   CHOLHDL 3.0 01/07/2017 1332   VLDL 15 01/07/2017 1332   LDLCALC 62 01/07/2017 1332   Lab Results  Component Value Date   HGBA1C 5.7 (H) 01/07/2017      Component Value Date/Time   LABOPIA NONE DETECTED 01/07/2017 1504   COCAINSCRNUR NONE DETECTED 01/07/2017 1504   LABBENZ NONE DETECTED 01/07/2017 1504   AMPHETMU NONE DETECTED 01/07/2017 1504   THCU NONE DETECTED 01/07/2017 1504   LABBARB NONE DETECTED 01/07/2017 1504    No results for input(s): ETH in the last 168  hours.  IMAGING: I have personally reviewed the radiological images below and agree with the radiology interpretations.  Ct Angio Head and neck W Or Wo Contrast 01/07/2017 IMPRESSION: 1. 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 2. No intracranial arterial occlusion or high-grade stenosis. 3. 55% stenosis of the proximal right internal carotid artery. 4. Aortic Atherosclerosis (ICD10-I70.0) and Emphysema (ICD10-J43.9).   Dg Chest Port 1 View 01/07/2017 IMPRESSION: 1. Cardiomegaly with aortic atherosclerosis. 2. Subpleural interstitial changes consistent with pulmonary interstitial fibrosis. 3. No overt pulmonary edema nor acute pneumonic consolidations.   Ct Head Code Stroke Wo Contrast 01/07/2017 IMPRESSION: 1. Severely motion degraded examination, but no visible acute hemorrhage. 2. ASPECTS is not able to be reliably assessed because of the degree of motion. However, there is no large territory infarct or mass effect.   Mr Brain Wo Contrast 01/08/2017 IMPRESSION: Negative for acute infarct. Moderate atrophy and chronic microvascular ischemia.   TTE Impressions: - Normal LV size with EF 55%. Normal RV size and systolic function.   No significant valvular abnormalities  EEG  EEG is abnormal due to moderate diffuse slowing of the waking background.  PHYSICAL EXAM Temp:  [97.6 F (36.4 C)-98.1 F (36.7 C)] 97.6 F (36.4 C) (11/07 1023) Pulse Rate:  [67-78] 68 (11/07 1023) Resp:  [16-18] 18 (11/07 1023) BP: (103-141)/(68-79) 114/79 (11/07 1023) SpO2:  [96 %-98 %] 96 % (11/07 1023)  General - Well nourished, well developed, in no apparent distress Respiratory - Lungs clear bilaterally. No wheezing. Cardiovascular - Irregular rate and rhythm   Neurological Examination Mental Status: Awake, cooperative with examiner. Oriented to family and simple questioning, Clear speech pattern. Following all commands Cranial  Nerves: II: Visual fields appear intact OU III,IV, VI: ptosis not present, extra-ocular motions intact bilaterally, pupils equal, round, reactive to light and accommodation V,VII: smile symmetric, facial light touch sensation normal bilaterally VIII: hearing normal bilaterally but HOH IX,X: uvula rises symmetrically XI: bilateral shoulder shrug XII: midline tongue extension Motor: He has residual right hemiparesis with RUE mild pronator drift and right hand mild dexterity difficulty, RLE 4/5 but able to lift antigravity and 5/5 strength on the left.  Some mild contraction in the right arm Sensory: Pinprick and light touch intact throughout, bilaterally Cerebellar: No ataxia with finger to nose testing Gait: not teested  ASSESSMENT AND PLAN: Herbert Marquez is an 83 y.o. male with history of prio CVAs, seizure disorder, hypertension, diabetes, atrial fibrillation, diastolic dysfunction, COPD, CAD, dementia  in the past with residual right sided weakness and only ability to walk short distances presented with agitated and and unable to speak. The wife did not notice any staring episode or seizure like activity. HE required 1 mg ativan and 10 mg Haldol to sedate patient but he was still very agitated. He had taken his dose of Eliquis. CT head showed no acute bleed but had lots of movement. Patient was not a tPA candidate due to [facility] and not a candidate for IR as he did not have LVO.   01/09/17: No further reports of agitation and confusion. TEE and EEG mostly negative. Appears at baseline today per family. Admission symptoms likely post ictal confusion. Continue Keprra at 750 mg BID. Continue ASA and Eliquis. Patient has follow up appointment with Dr Erlinda Hong on December 11.2018.   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  CT Head - 01/07/17 Severely motion degraded examination, but no visible acute hemorrhage.  MRI brain - no acute infarct  2D Echo - LVEF 55%  EEG -No seizure activity, moderate diffuse slowing  LDL - 62  HgbA1c 5.7  VTE prophylaxis - SCD's  Diet - Diet Heart Room service appropriate? Yes; Fluid consistency: Thin   CVA Meds-   aspirin 81 mg daily and Eliquis 5 mg BID prior to admission, now on aspirin 325 mg daily.  ASA 81mg  and eliquis 5mg  bid resumed.  Family counseled to be compliant with her antithrombotic medications  Ongoing aggressive stroke risk factor management  Therapy recommendations:  CIR  Disposition: CIR  Follow up with [facility], Dr Erlinda Hong on December 11,2018  HX OF STROKES: 11/2013 TIA - 6 weeks after cardiac cath.  06/2015 Bilateral Thalami and right mesial temporal lobe due to left PCA occlusion and right PCA high grade stenosis &afib   11/28/2015 - afib post right hip fracture surgery on eliquis but later discontinued at follow up clinic and put on DAPT  03/30/2016 -  admitted for  speechdifficulties and aphasia. Hereceived IV t-PA. MRI showed left temproal infarct with hemorrhagic conversion and SDH. CTA head and neck 60-70% proximal right ICA and 40-50% left ICA stenosis. EF 65-70% in 11/2015. LDL 38 and A1C 5.0. ASA started 7 days post stroke due to hemorrhagic conversion and eliquis started on 04/13/16. lipitor down from 80mg  to 40mg . He was discharged to CIR.   06/04/2016 -readmission for confusion and agitation. MRI punctate right hippocampal infarctlikely acute, and old left temporal lobe hemorrhagic infarct. His spell on admission most suggestive of seizure was started on Keppra at 500 mg twice daily. EEG moderatediffuse slowing. His eliquis and lipitor continued and discharged to CIR.  RICA STENOSIS:  ASA 81 mg daily and Eliquis 5mg  BID restarted  Continue ASA and eliquis  CTA head and neck 60-70% proximal right ICA and  40-50% left ICA stenosis.  SEIZURE:  No seizure activity reported at home or since admission  Seizure precautions ordered  Similar presentation of 06/2016 admission with presentation of acute onset confusion and agitation.  Keppra level - pending  EEG - No seizure activity, moderate diffuse slowing  Increase Keppra to 750mg  bid.  DEMENTIA / SUNDOWNING:  U/A and UDS Negative  Continue home dose of Seroquel for now  Limit use of Ativan for behavior issues  AFIB, CHRONIC:  Rate control on telemetry  Continue to monitor closely  Eliquis and ASA resumed   DIASTOLIC DYSFUNCTION:  Diastolic dysfunction. Echo in April of this year reveals an EF 55% grade 1 DD  Management per primary team   ECHO - EF 55%  HYPONATREMIA:  Na 133 > 138 today  Resolved  HYPERTENSION: Stable Permissive hypertension (OK if <220/120) for 24-48 hours post stroke and then gradually normalized within 5-7 days. PRN hydralazine, if necessary  Long term BP goal normotensive.   Close PCP follow up.  HYPERLIPIDEMIA:  Home meds:  Lipitor 80 mg  LDL    62     , goal < 70  Continued on  Lipitor to 80 mg daily  Continue statin at discharge  Other Stroke Risk Factors:  Advanced age  Coronary artery disease  Other Active Problems:  CKD III  COPD  Aortic Atherosclerosis  Emphysema   Hospital day # 2  Renie Ora Stroke Neurology Team 01/09/2017 12:42 PM   I reviewed above note and agree with the assessment and plan. I have made any additions or clarifications directly to the above note. Pt was seen and examined. No neuro changes. Increase keppra to 750mg  bid. Continue eliquis and ASA. Neurology to sign off. Please call with any further questions or concerns. Pt to follow up with Dr. Erlinda Hong at [facility] on 02/12/17. Thank you for this consultation.  Rosalin Hawking, MD PhD Stroke Neurology 01/09/2017 9:48 PM      To contact Stroke Continuity provider, please refer to  [email or web address] After hours, contact General Neurology

## 2017-01-10 ENCOUNTER — Other Ambulatory Visit: Payer: Self-pay

## 2017-01-10 DIAGNOSIS — R270 Ataxia, unspecified: Secondary | ICD-10-CM | POA: Diagnosis not present

## 2017-01-10 DIAGNOSIS — R54 Age-related physical debility: Secondary | ICD-10-CM | POA: Diagnosis not present

## 2017-01-10 DIAGNOSIS — R569 Unspecified convulsions: Secondary | ICD-10-CM | POA: Diagnosis not present

## 2017-01-10 DIAGNOSIS — R278 Other lack of coordination: Secondary | ICD-10-CM | POA: Diagnosis not present

## 2017-01-10 DIAGNOSIS — R41841 Cognitive communication deficit: Secondary | ICD-10-CM | POA: Diagnosis not present

## 2017-01-10 DIAGNOSIS — I639 Cerebral infarction, unspecified: Secondary | ICD-10-CM | POA: Diagnosis not present

## 2017-01-10 DIAGNOSIS — R2681 Unsteadiness on feet: Secondary | ICD-10-CM | POA: Diagnosis not present

## 2017-01-10 DIAGNOSIS — I69393 Ataxia following cerebral infarction: Secondary | ICD-10-CM | POA: Diagnosis not present

## 2017-01-10 DIAGNOSIS — M6281 Muscle weakness (generalized): Secondary | ICD-10-CM | POA: Diagnosis not present

## 2017-01-10 DIAGNOSIS — I69359 Hemiplegia and hemiparesis following cerebral infarction affecting unspecified side: Secondary | ICD-10-CM | POA: Diagnosis not present

## 2017-01-10 DIAGNOSIS — G464 Cerebellar stroke syndrome: Secondary | ICD-10-CM | POA: Diagnosis not present

## 2017-01-10 LAB — MAGNESIUM: Magnesium: 1.9 mg/dL (ref 1.7–2.4)

## 2017-01-10 LAB — URINE CULTURE: Culture: NO GROWTH

## 2017-01-10 LAB — GLUCOSE, CAPILLARY: Glucose-Capillary: 112 mg/dL — ABNORMAL HIGH (ref 65–99)

## 2017-01-10 MED ORDER — SENNOSIDES-DOCUSATE SODIUM 8.6-50 MG PO TABS: 1.0000 | ORAL_TABLET | Freq: Once | ORAL | Status: DC

## 2017-01-10 MED ORDER — MAGNESIUM SULFATE 2 GM/50ML IV SOLN
2.0000 g | Freq: Once | INTRAVENOUS | Status: AC
  Administered 2017-01-10: 2 g via INTRAVENOUS
  Filled 2017-01-10: qty 50

## 2017-01-10 MED ORDER — LEVETIRACETAM 750 MG PO TABS: 750.0000 mg | ORAL_TABLET | Freq: Two times a day (BID) | ORAL | 0 refills | Status: DC

## 2017-01-10 MED ORDER — SENNOSIDES-DOCUSATE SODIUM 8.6-50 MG PO TABS: 1.0000 | ORAL_TABLET | Freq: Every day | ORAL | 0 refills | Status: DC

## 2017-01-10 MED ORDER — BISACODYL 10 MG RE SUPP
10.0000 mg | Freq: Once | RECTAL | Status: AC
  Administered 2017-01-10: 10 mg via RECTAL
  Filled 2017-01-10: qty 1

## 2017-01-10 MED ORDER — ACETAMINOPHEN 325 MG PO TABS: 650.0000 mg | ORAL_TABLET | ORAL | 0 refills | Status: AC | PRN

## 2017-01-10 NOTE — Clinical Social Work Placement (Signed)
Nurse to call report to [phone], Room 106  Transport to pick up after 4:00 PM.    CLINICAL SOCIAL WORK PLACEMENT  NOTE  Date:  01/10/2017  Patient Details  Name: Herbert Marquez MRN: [id number] Date of Birth: 1927/07/16  Clinical Social Work is seeking post-discharge placement for this patient at the [place] level of care (*CSW will initial, date and re-position this form in  chart as items are completed):  Yes   Patient/family provided with [place] Work Department's list of facilities offering this level of care within the geographic area requested by the patient (or if unable, by the patient's family).  Yes   Patient/family informed of their freedom to choose among providers that offer the needed level of care, that participate in Medicare, Medicaid or managed care program needed by the patient, have an available bed and are willing to accept the patient.  Yes   Patient/family informed of [facility]'s ownership interest in [facility] and [facility], as well as of the fact that they are under no obligation to receive care at these facilities.  PASRR submitted to EDS on 01/10/17     PASRR number received on       Existing PASRR number confirmed on       FL2 transmitted to all facilities in geographic area requested by pt/family on       FL2 transmitted to all facilities within larger geographic area on 01/10/17     Patient informed that his/her managed care company has contracts with or will negotiate with certain facilities, including the following:        Yes   Patient/family informed of bed offers received.  Patient chooses bed at [facility], [place]     Physician recommends and patient chooses bed at      Patient to be transferred to [place], [place] on 01/10/17.  Patient to be transferred to facility by [facility]     Patient family notified on 01/10/17 of transfer.  Name of family member notified:  [name]      PHYSICIAN       Additional Comment:    _______________________________________________ Geralynn Ochs, [place] 01/10/2017, 2:20 PM

## 2017-01-10 NOTE — Progress Notes (Signed)
Physical Therapy Treatment Patient Details Name: Herbert Marquez MRN: [id number] DOB: 11-22-1927 Today's Date: 01/10/2017    History of Present Illness 83 y.o. male with history of prio CVAs, seizure disorder, hypertension, diabetes, atrial fibrillation, diastolic dysfunction, COPD, CAD, dementia  in the past with residual right sided weakness and only ability to walk short distances. He has no problems talking at baseline. Pt presents to ED with global aphasia and SOB. MRI negative for acute infarcts.    PT Comments    Pt has increased strength and ability to follow commands during treatment session today, however pt continues to be limited in mobility by R knee pain and R sided weakness. Pt was minA for bed mobility and modAx2 for 2x stand pivot transfers to his L side. Pt requires skilled PT in the acute setting to progress mobility and improve strength and endurance to safely navigate their discharge environment.     Follow Up Recommendations  SNF     Equipment Recommendations  None recommended by PT    Recommendations for Other Services       Precautions / Restrictions Precautions Precautions: Fall Restrictions Weight Bearing Restrictions: No    Mobility  Bed Mobility Overal bed mobility: Needs Assistance Bed Mobility: Supine to Sit     Supine to sit: HOB elevated;Min assist     General bed mobility comments: minA for managing LE to floor and scooting hips to EoB with pad, Pt able to bring trunk to upright without assist  Transfers Overall transfer level: Needs assistance Equipment used: 2 person hand held assist Transfers: Stand Pivot Transfers Sit to Stand: Mod assist;+2 physical assistance Stand pivot transfers: Mod assist;+2 physical assistance       General transfer comment: modAx2 for powerup and steadying, vc for hand placement to [facility] from the bed and BSC to recliner Pt transfered to L to take advantage of L LE strength   Modified Rankin (Stroke  Patients Only) Modified Rankin (Stroke Patients Only) Pre-Morbid Rankin Score: Moderate disability Modified Rankin: Moderately severe disability     Balance Overall balance assessment: Needs assistance Sitting-balance support: No upper extremity supported;Feet supported Sitting balance-Leahy Scale: Fair Sitting balance - Comments: able to sit on bedside commode without LoB                                    Cognition Arousal/Alertness: Awake/alert Behavior During Therapy: WFL for tasks assessed/performed Overall Cognitive Status: History of cognitive impairments - at baseline Area of Impairment: Attention;Following commands;Awareness;Problem solving                   Current Attention Level: Sustained   Following Commands: Follows one step commands inconsistently;Follows one step commands with increased time   Awareness: Intellectual Problem Solving: Difficulty sequencing;Requires verbal cues;Requires tactile cues;Slow processing General Comments: Pt with baseline dementia. However, had improved cognition today and was able to follow one step commands.          General Comments General comments (skin integrity, edema, etc.): Pt wife and daughter present during session. Pt visibly disappointed to be going to SNF      Pertinent Vitals/Pain Pain Assessment: Faces Faces Pain Scale: Hurts little more Pain Location: R knee with movement Pain Descriptors / Indicators: Grimacing;Guarding;Sore;Throbbing Pain Intervention(s): Limited activity within patient's tolerance;Monitored during session;Repositioned           PT Goals (current goals can now be found in the  care plan section) Acute Rehab PT Goals Patient Stated Goal: Get stronger before going home PT Goal Formulation: With patient/family Time For Goal Achievement: 01/22/17 Potential to Achieve Goals: Fair Progress towards PT goals: Progressing toward goals    Frequency    Min 2X/week       PT Plan Current plan remains appropriate;Discharge plan needs to be updated       AM-PAC PT "6 Clicks" Daily Activity  Outcome Measure  Difficulty turning over in bed (including adjusting bedclothes, sheets and blankets)?: Unable Difficulty moving from lying on back to sitting on the side of the bed? : Unable Difficulty sitting down on and standing up from a chair with arms (e.g., wheelchair, bedside commode, etc,.)?: Unable Help needed moving to and from a bed to chair (including a wheelchair)?: A Lot Help needed walking in hospital room?: Total Help needed climbing 3-5 steps with a railing? : Total 6 Click Score: 7    End of Session Equipment Utilized During Treatment: Gait belt Activity Tolerance: Patient tolerated treatment well Patient left: with call bell/phone within reach;with family/visitor present;in bed;with bed alarm set;with SCD's reapplied Nurse Communication: Mobility status PT Visit Diagnosis: Unsteadiness on feet (R26.81);Other abnormalities of gait and mobility (R26.89);Muscle weakness (generalized) (M62.81);Difficulty in walking, not elsewhere classified (R26.2);Other symptoms and signs involving the nervous system (R29.898);History of falling (Z91.81);Hemiplegia and hemiparesis Hemiplegia - Right/Left: Right Hemiplegia - dominant/non-dominant: Dominant Hemiplegia - caused by: Unspecified(previous cerebral infarct)     Time: 8527-7824 PT Time Calculation (min) (ACUTE ONLY): 30 min  Charges:  $Therapeutic Activity: 23-37 mins                    G Codes:       Herbert Marquez B. Migdalia Dk PT, DPT Acute Rehabilitation  [phone] Pager [phone]     Herbert Marquez 01/10/2017, 4:03 PM

## 2017-01-10 NOTE — NC FL2 (Signed)
[place] LEVEL OF CARE SCREENING TOOL     IDENTIFICATION  Patient Name: Herbert Marquez Birthdate: 02-21-1928 Sex: male Admission Date (Current Location): 01/07/2017  [facility] and [place] Number:  Herbalist and Address:  [facility], [place] [address], [place], [place] [address]      Provider Number: [id number]  Attending Physician Name and Address:  Elmarie Shiley, MD  Relative Name and Phone Number:       Current Level of Care: Hospital Recommended Level of Care: [place] Prior Approval Number:    Date Approved/Denied:   PASRR Number: [id number]  Discharge Plan: SNF    Current Diagnoses: Patient Active Problem List   Diagnosis Date Noted  . Pressure injury of skin 01/08/2017  . Stroke ([place]) 01/07/2017  . Ischemic stroke ([place])   . Second degree Mobitz II AV block 10/23/2016  . Seizures ([place]) 10/09/2016  . Healthcare maintenance 10/01/2016  . Restlessness and agitation 07/02/2016  . Vascular dementia with behavior disturbance 06/29/2016  . Hemiparesis and other late effects of cerebrovascular accident ([place]) 06/29/2016  . Embolic stroke ([place]) 56/21/3086  . Right hemiparesis ([place])   . History of CVA with residual deficit   . Chronic anticoagulation   . Atrial fibrillation with rapid ventricular response ([place])   . Seizure prophylaxis   . Diastolic dysfunction   . Bacteremia due to Gram-positive bacteria   . Altered mental status   . Dementia without behavioral disturbance   . Leukocytosis   . Acute blood loss anemia   . Acute encephalopathy 06/04/2016  . PAF (paroxysmal atrial fibrillation) ([place])   . Iron deficiency anemia 04/04/2016  . History of CVA (cerebrovascular accident) 04/04/2016  . Cerebral hemorrhage (HCC) w/ SDH s/p IV tPA   . Coronary artery disease involving native coronary artery of native heart without angina pectoris   . Orthostatic hypotension   . History of right hip  replacement   . Acute ischemic stroke ([place]) - L temporal lobe s/p tPA 03/30/2016  . BPH (benign prostatic hyperplasia) 11/25/2015  . GERD (gastroesophageal reflux disease) 11/25/2015  . CKD (chronic kidney disease), stage II 11/25/2015  . Overactive bladder   . Cerebrovascular accident (CVA) ([place]) 09/18/2015  . Gait disturbance, post-stroke 06/23/2015  . 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 Thalamic infarction ([place]) 06/21/2015  . Benign essential HTN   . Type 2 diabetes mellitus with complication, without long-term current use of insulin ([place])   . Dysphagia as late effect of cerebrovascular disease   . Hyponatremia   . HLD (hyperlipidemia)   . COPD (chronic obstructive pulmonary disease) ([place]) 03/09/2011    Orientation RESPIRATION BLADDER Height & Weight     Self  Normal Incontinent, External catheter(catheter placed 01/09/17) Weight: 163 lb 12.8 oz (74.3 kg) Height:  5\' 6"  (167.6 cm)  BEHAVIORAL SYMPTOMS/MOOD NEUROLOGICAL BOWEL NUTRITION STATUS    Convulsions/Seizures (unknown) Diet(low sodium)  AMBULATORY STATUS COMMUNICATION OF NEEDS Skin   Extensive Assist Verbally PU Stage and Appropriate Care   PU Stage 2 Dressing: No Dressing(not a pressure injury, partial thickness fissure related to moisture on sacrum)                   Personal Care Assistance Level of Assistance  Bathing, Dressing Bathing Assistance: Maximum assistance   Dressing Assistance: Maximum assistance     Functional Limitations Info  Sight, Hearing, Speech Sight Info: Adequate Hearing Info: Adequate Speech Info: Impaired(slurred/dysarthria)  SPECIAL CARE FACTORS FREQUENCY  PT (By licensed PT), OT (By licensed OT)     PT Frequency: 5x/wk OT Frequency: 5x/wk            Contractures Contractures Info: Not present    Additional Factors Info  Code Status, Allergies, Psychotropic Code Status Info: DNR Allergies Info: NKA Psychotropic Info: Seroquel 25 mg daily         Current  Medications (01/10/2017):  This is the current hospital active medication list Current Facility-Administered Medications  Medication Dose Route Frequency Provider Last Rate Last Dose  .  stroke: mapping our early stages of recovery book   Does not apply Once Black, Karen M, NP      . acetaminophen (TYLENOL) tablet 650 mg  650 mg Oral Q4H PRN Radene Gunning, NP   650 mg at 01/10/17 1010   Or  . acetaminophen (TYLENOL) solution 650 mg  650 mg Per Tube Q4H PRN Radene Gunning, NP       Or  . acetaminophen (TYLENOL) suppository 650 mg  650 mg Rectal Q4H PRN Radene Gunning, NP      . apixaban (ELIQUIS) tablet 5 mg  5 mg Oral BID Rosalin Hawking, MD   5 mg at 01/10/17 1001  . aspirin EC tablet 81 mg  81 mg Oral Daily Rosalin Hawking, MD   81 mg at 01/10/17 1001  . atorvastatin (LIPITOR) tablet 80 mg  80 mg Oral q1800 Radene Gunning, NP   80 mg at 01/09/17 1717  . hydrALAZINE (APRESOLINE) injection 5 mg  5 mg Intravenous Q4H PRN Radene Gunning, NP      . levETIRAcetam (KEPPRA) tablet 750 mg  750 mg Oral BID Rosalin Hawking, MD   750 mg at 01/10/17 1001  . LORazepam (ATIVAN) injection 0.25 mg  0.25 mg Intravenous Once Rise Patience, MD      . polyvinyl alcohol (LIQUIFILM TEARS) 1.4 % ophthalmic solution 1 drop  1 drop Both Eyes Daily PRN Radene Gunning, NP   1 drop at 01/10/17 1128  . QUEtiapine (SEROQUEL) tablet 12.5 mg  12.5 mg Oral Daily PRN Regalado, Belkys A, MD      . QUEtiapine (SEROQUEL) tablet 25 mg  25 mg Oral QHS Regalado, Belkys A, MD   25 mg at 01/09/17 2102  . senna-docusate (Senokot-S) tablet 1 tablet  1 tablet Oral QHS PRN Black, Lezlie Octave, NP         Discharge Medications: Please see discharge summary for a list of discharge medications.  Relevant Imaging Results:  Relevant Lab Results:   Additional Information SS#: [id number]  Geralynn Ochs, LCSW

## 2017-01-10 NOTE — Progress Notes (Addendum)
CCMD reported patient had 11 beats of wide QRS at 1416 after working with PT and moved to chair.  Pt is in no distress and verbalizes no complaints.  Dr. Tyrell Antonio notified. Orders obtained for stat EKG and mag level.

## 2017-01-10 NOTE — Progress Notes (Addendum)
Pt waiting for PT[facility] to transport to [facility]. Wife is anxious for transport. Pt and wife understand discharge plan and are in agreement.  Pt became tearful earlier today when talking about going to nursing home.  Support given. Wife has all belongings with her.

## 2017-01-10 NOTE — Care Management Note (Signed)
Case Management Note  Patient Details  Name: [name] MRN: [id number] Date of Birth: [birth year]  Subjective/Objective:                    Action/Plan: Pt discharging to [place] today. No further needs per CM.  Expected Discharge Date:  01/10/17               Expected Discharge Plan:  Skilled Nursing Facility  In-House Referral:  Clinical Social Work  Discharge planning Services  CM Consult  Post Acute Care Choice:    Choice offered to:     DME Arranged:    DME Agency:     HH Arranged:    [place] Agency:     Status of Service:  Completed, signed off  If discussed at [employer], dates discussed:    Additional Comments:  Pollie Friar, RN 01/10/2017, 12:15 PM

## 2017-01-10 NOTE — Consult Note (Signed)
[facility] CM Primary Care Navigator  01/10/2017  Herbert Marquez 08-30-1927 [id number]    Met with patient and wife (Sandy)at the bedside to identify possible discharge needs.  Wife reports that patient was having slurred speech and feeling weak/ easily fatigued that resulted to this admission.  Patient endorses Mina Marble with Primary Care at [facility] as hisprimary care provider.  Patient [place] [facility] [address] and [employer] in [place] to obtain medications without any problem. Patient's wife verbalized managing his medications at home using "pill box" system filled once a week.  Wife states she has been providing transportation to his doctors' appointments.  Patient's wife is the primary caregiver for patient at home. Wife mentioned that she can hire extra help if needed when patient is back home. She reports having contact numbers for several agencies to use when needed.   Anticipated discharge plan is skilled nursing facility (SNF- [facility]) prior to returning home according to wife.  Patient and wife expressed understanding to callprimary care provider's officewhen hereturnsback home for a post discharge follow-up appointment within a week or sooner if needed. Patient letter (with PCP's contact number) was provided as a reminder.  Explained to patientand wife about [facility] CM services available for health management at home but both communicated no current needs or concerns at this point. Wife mentioned of being able to manage health issues at home so far. Patient and wife expressed understanding to seek referral from primary care provider to [facility] care management if deemed necessary and appropriate for services in the future.  [facility] care management information provided for future needs that patient may have.   For additional questions please contact:  Edwena Felty A. Sevag Shearn, BSN, RN-BC [facility] PRIMARY CARE Navigator Cell: [phone]

## 2017-01-10 NOTE — Discharge Instructions (Signed)

## 2017-01-10 NOTE — Discharge Summary (Addendum)
Physician Discharge Summary  Herbert Marquez SWN:[id number] DOB: [birth year] DOA: 01/07/2017  PCP: [name], NP  Admit date: 01/07/2017 Discharge date: 01/10/2017  Admitted From: Home  Disposition: SNF  Recommendations for Outpatient Follow-up:  1. Follow up with PCP in 1-2 weeks 2. Please obtain BMP/CBC in one week     Discharge Condition: Stable.  CODE STATUS: DNR Diet recommendation: Heart Healthy   Brief/Interim Summary: XFG:[id number] Herbert Jonesis a 83 y.o.malewith medical history significantformultiple strokes with residual right-sided weakness, hypertension, seizure disorder, diabetes,A. Fib,GERD, diastolic dysfunction, COPD, CAD, dementia, presents to the emergency department with the chief complaint difficulty speaking. Initial evaluation reveals acute ischemic stroke. [facility] are asked to admit.  Information is obtained from the chart and the patient's wife is at the bedside. She states he was in his usual state of health in the doctor's office today and he suddenly had difficulty speaking. She states "I couldn't understand what he was saying and". By the time they got home she had difficulty getting him into the house as "he could not follow my instructions". There are no complaints of headache dizziness syncope or near-syncope. No complaints of chest pain palpitation shortness of breath. No nausea vomiting. No diarrhea dysuria hematuria frequency or urgency. Patient's wife reports he suffers from chronic pain "all over". No fever chills recent travel or sick contacts.  ED Course:In the emergency department is afebrile hemodynamically stable and not hypoxic. He was evaluated by neurology who recommended medicine admission with repeat CT in 24 hours as he has a pacemaker     Assessment & Plan: Seizure episode, less likely TIA;  Neurology think episode was related to seizure.  Keppra increase to 750 mg BID> tolerating medication so far.  Continue with  eliquis.  EEG  ECHO  LDL-62  HgbA1c5.7 More alert today. He was sleepy yesterday like from the dose of ativan that he received.  Discharge to SNF today   HTN; BP controlled.   Chronic diastolic Dysfunction; appears compensated.   CKD stage III; monitor.   Seizure; increased  keppra.   COPD; stable.   Dementia; continue with Seroquel.   Leukocytosis;  Received steroid injection.  Follow trend  Urine culture no growth.   Constipation; dulcolax suppository and senna ordered.  Run VT; EKG no significant changes. Replete mg. patient was asymptomatic,   Discharge Diagnoses:  Principal Problem:   Stroke [facility]) Active Problems:   COPD (chronic obstructive pulmonary disease) (HCC)   HLD (hyperlipidemia)   Benign essential HTN   Ataxia due to recent stroke   Cerebrovascular accident (CVA) ([place])   BPH (benign prostatic hyperplasia)   GERD (gastroesophageal reflux disease)   CKD (chronic kidney disease), stage II   Coronary artery disease involving native coronary artery of native heart without angina pectoris   Dementia without behavioral disturbance   Chronic anticoagulation   Diastolic dysfunction   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   apixaban 5 MG Tabs tablet Commonly known as:  ELIQUIS Take 1 tablet (5 mg total) by mouth 2 (two) times daily. Resume 10/25/16 evening dose   ARTIFICIAL TEARS 1.4 % ophthalmic solution Generic drug:   polyvinyl alcohol Place 1 drop into both eyes daily as needed for dry eyes.   aspirin EC 81 MG tablet Take 1 tablet (81 mg total) by mouth daily. What changed:  when to take this   atorvastatin 80 MG tablet Commonly known as:  LIPITOR TAKE 1 TABLET BY MOUTH DAILY.   AZO TABS PO Take 1 tablet 3 (three) times daily by mouth.   levETIRAcetam 750 MG tablet Commonly known as:  KEPPRA Take 1 tablet (750 mg total) 2 (two) times daily by mouth. What changed:    medication strength  how much to take   multivitamin with minerals Tabs tablet Take 1 tablet by mouth daily.   nitroGLYCERIN 0.4 MG SL tablet Commonly known as:  NITROSTAT Place 1 tablet (0.4 mg total) under the tongue every 5 (five) minutes as needed for chest pain.   QUEtiapine 25 MG tablet Commonly known as:  SEROQUEL TAKE 1 TABLET BY MOUTH AT BEDTIME.   senna-docusate 8.6-50 MG tablet Commonly known as:  Senokot-S Take 1 tablet daily by mouth.       Contact information for follow-up providers    Rosalin Hawking, MD. Go on 02/12/2017.   Specialty:  Neurology Contact information: [address] [place] [address] [phone]            Contact information for after-discharge care    Destination    [facility] .   Service:  Skilled Nursing Contact information: [place] [phone]                 No Known Allergies  Consultations:  Neurology    Procedures/Studies: Ct Angio Head W Or Wo Contrast  Result Date: 01/07/2017 CLINICAL DATA:  Aphasia EXAM: CT ANGIOGRAPHY HEAD AND NECK TECHNIQUE: Multidetector CT imaging of the head and neck was performed using the standard protocol during bolus administration of intravenous contrast. Multiplanar CT image reconstructions and MIPs were obtained to evaluate the vascular anatomy. Carotid stenosis measurements (when applicable) are obtained utilizing NASCET criteria,  using the distal internal carotid diameter as the denominator. CONTRAST:  50 mL Isovue 370 COMPARISON:  Head CT same date Brain MRI 06/07/2016 FINDINGS: CTA NECK FINDINGS Aortic arch: There is moderate calcific atherosclerosis of the aortic arch. There is no aneurysm, dissection or hemodynamically significant stenosis of the visualized ascending aorta and aortic arch. Conventional 3 vessel aortic branching pattern. The visualized proximal subclavian arteries are normal. Right carotid system: The right common carotid origin is widely patent. There is no common carotid or internal carotid artery dissection or aneurysm. There is mixed calcified and noncalcified plaque at the right carotid bifurcation the results in 55% stenosis. The right internal carotid artery is widely patent to the skullbase. Left carotid system: The left common carotid origin is widely patent. There is no common carotid or internal carotid artery dissection or aneurysm. Atherosclerotic calcification at the carotid bifurcation without hemodynamically significant stenosis. Vertebral arteries: The vertebral system is left dominant. Both vertebral artery origins are normal. The right vertebral artery is occluded at the V3 segment, new compared to 06/07/2016. Skeleton: Multilevel moderate to severe facet arthrosis. Grade 1 C7-T1 anterolisthesis Other neck:  The nasopharynx is clear. The oropharynx and hypopharynx are normal. The epiglottis is normal. The supraglottic larynx, glottis and subglottic larynx are normal. No retropharyngeal collection. The parapharyngeal spaces are preserved. The parotid and submandibular glands are normal. No sialolithiasis or salivary ductal dilatation. The thyroid gland is normal. There is no cervical lymphadenopathy. Upper chest: Bilateral upper lobe scarring and emphysema. Review of the MIP images confirms the above findings CTA HEAD FINDINGS Anterior circulation: --Intracranial internal carotid arteries: Non stenotic  calcific atherosclerosis both internal carotid arteries at the skullbase. --Anterior cerebral arteries: Normal. --Middle cerebral arteries: Normal. --Posterior communicating arteries: Absent bilaterally. Posterior circulation: --Posterior cerebral arteries: Normal. --Superior cerebellar arteries: Normal. --Basilar artery: Normal. --Anterior inferior cerebellar arteries: Normal. --Posterior inferior cerebellar arteries: Diminished enhancement of the right PICA. Venous sinuses: As permitted by contrast timing, patent. Anatomic variants: None Delayed phase: Not performed. Review of the MIP images confirms the above findings IMPRESSION: 1. 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 2. No intracranial arterial occlusion or high-grade stenosis. 3. 55% stenosis of the proximal right internal carotid artery. 4. Aortic Atherosclerosis (ICD10-I70.0) and Emphysema (ICD10-J43.9). Critical Value/emergent results were called by telephone at the time of interpretation on 01/07/2017 at 2:48 pm to Dr. Johnney Killian, who verbally acknowledged these results. Electronically Signed   By: Ulyses Jarred M.D.   On: 01/07/2017 14:49   Ct Angio Neck W Or Wo Contrast  Result Date: 01/07/2017 CLINICAL DATA:  Aphasia EXAM: CT ANGIOGRAPHY HEAD AND NECK TECHNIQUE: Multidetector CT imaging of the head and neck was performed using the standard protocol during bolus administration of intravenous contrast. Multiplanar CT image reconstructions and MIPs were obtained to evaluate the vascular anatomy. Carotid stenosis measurements (when applicable) are obtained utilizing NASCET criteria, using the distal internal carotid diameter as the denominator. CONTRAST:  50 mL Isovue 370 COMPARISON:  Head CT same date Brain MRI 06/07/2016 FINDINGS: CTA NECK FINDINGS Aortic arch: There is moderate calcific atherosclerosis of the aortic arch. There is no aneurysm, dissection or hemodynamically significant  stenosis of the visualized ascending aorta and aortic arch. Conventional 3 vessel aortic branching pattern. The visualized proximal subclavian arteries are normal. Right carotid system: The right common carotid origin is widely patent. There is no common carotid or internal carotid artery dissection or aneurysm. There is mixed calcified and noncalcified plaque at the right carotid bifurcation the results in 55% stenosis. The right internal carotid artery is widely patent to the skullbase. Left carotid system: The left common carotid origin is widely patent. There is no common carotid or internal carotid artery dissection or aneurysm. Atherosclerotic calcification at the carotid bifurcation without hemodynamically significant stenosis. Vertebral arteries: The vertebral system is left dominant. Both vertebral artery origins are normal. The right vertebral artery is occluded at the V3 segment, new compared to 06/07/2016. Skeleton: Multilevel moderate to severe facet arthrosis. Grade 1 C7-T1 anterolisthesis Other neck: The nasopharynx is clear. The oropharynx and hypopharynx are normal. The epiglottis is normal. The supraglottic larynx, glottis and subglottic larynx are normal. No retropharyngeal collection. The parapharyngeal spaces are preserved. The parotid and submandibular glands are normal. No sialolithiasis or salivary ductal dilatation. The thyroid gland is normal. There is no cervical lymphadenopathy. Upper chest: Bilateral upper lobe scarring and emphysema. Review of the MIP images confirms the above findings CTA HEAD FINDINGS Anterior circulation: --Intracranial internal carotid arteries: Non stenotic calcific atherosclerosis both internal carotid arteries at the skullbase. --Anterior cerebral arteries: Normal. --Middle cerebral  arteries: Normal. --Posterior communicating arteries: Absent bilaterally. Posterior circulation: --Posterior cerebral arteries: Normal. --Superior cerebellar arteries: Normal.  --Basilar artery: Normal. --Anterior inferior cerebellar arteries: Normal. --Posterior inferior cerebellar arteries: Diminished enhancement of the right PICA. Venous sinuses: As permitted by contrast timing, patent. Anatomic variants: None Delayed phase: Not performed. Review of the MIP images confirms the above findings IMPRESSION: 1. 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 2. No intracranial arterial occlusion or high-grade stenosis. 3. 55% stenosis of the proximal right internal carotid artery. 4. Aortic Atherosclerosis (ICD10-I70.0) and Emphysema (ICD10-J43.9). Critical Value/emergent results were called by telephone at the time of interpretation on 01/07/2017 at 2:48 pm to Dr. Johnney Killian, who verbally acknowledged these results. Electronically Signed   By: Ulyses Jarred M.D.   On: 01/07/2017 14:49   Mr Brain Wo Contrast  Result Date: 01/08/2017 CLINICAL DATA:  Stroke followup. EXAM: MRI HEAD WITHOUT CONTRAST TECHNIQUE: Multiplanar, multiecho pulse sequences of the brain and surrounding structures were obtained without intravenous contrast. COMPARISON:  CT 01/07/2017 FINDINGS: Brain: Moderate atrophy without hydrocephalus. Negative for acute infarct. Moderate chronic ischemic changes throughout the cerebral white matter bilaterally. Small chronic infarct in the right parietal cortex. Negative for hemorrhage or mass. Vascular: Normal arterial flow voids Skull and upper cervical spine: Negative Sinuses/Orbits: Negative Other: None IMPRESSION: Negative for acute infarct. Moderate atrophy and chronic microvascular ischemia. Electronically Signed   By: Franchot Gallo M.D.   On: 01/08/2017 13:07   Dg Chest Port 1 View  Result Date: 01/07/2017 CLINICAL DATA:  Confusion and combative.  Possible stroke. EXAM: PORTABLE CHEST 1 VIEW COMPARISON:  10/24/2016. FINDINGS: Stable cardiomegaly with tortuous thoracic calcified aorta. Left-sided pacemaker  apparatus with right atrial and right ventricular leads are again noted. Mild subpleural areas of interstitial lung disease likely related to fibrosis. No pneumonic consolidation or overt pulmonary edema. The left costophrenic angle is excluded on this study. IMPRESSION: 1. Cardiomegaly with aortic atherosclerosis. 2. Subpleural interstitial changes consistent with pulmonary interstitial fibrosis. 3. No overt pulmonary edema nor acute pneumonic consolidations. Electronically Signed   By: Ashley Royalty M.D.   On: 01/07/2017 22:20   Ct Head Code Stroke Wo Contrast  Result Date: 01/07/2017 CLINICAL DATA:  Code stroke.  Aphasia EXAM: CT HEAD WITHOUT CONTRAST TECHNIQUE: Contiguous axial images were obtained from the base of the skull through the vertex without intravenous contrast. COMPARISON:  Brain MRI 06/07/2016 FINDINGS: The examination is severely degraded by motion. Brain: No acute hemorrhage. There is advanced atrophy with findings of chronic microvascular ischemia. No midline shift or other mass effect. Vascular: Poor visualization due to motion. Skull: No skull fracture. Sinuses/Orbits: No acute finding. Other: None. ASPECTS [facility] Stroke Program Early CT Score) There is too much motion on the examination to provide a reliable ASPECTS rating. IMPRESSION: 1. Severely motion degraded examination, but no visible acute hemorrhage. 2. ASPECTS is not able to be reliably assessed because of the degree of motion. However, there is no large territory infarct or mass effect. Dr. Lorraine Lax paged at 2:00 PM Electronically Signed   By: Ulyses Jarred M.D.   On: 01/07/2017 14:02     Subjective: He is more alert today. Complaints of ache  pain all over.  No BM since Monday. Passing gas,   Discharge Exam: Vitals:   01/10/17 0500 01/10/17 0850  BP: 126/69 (!) 101/55  Pulse: 66 81  Resp: 16 16  Temp: 98 F (36.7 Herbert) 98.7 F (37.1 Herbert)  SpO2: 99% 95%   Vitals:   01/09/17 2058 01/10/17 0100 01/10/17 0500 01/10/17  0850  BP: 121/81 118/87 126/69 (!) 101/55  Pulse: 79 72 66 81  Resp: 18 17 16 16   Temp: 98.9 F (37.2 Herbert) 98.6 F (37 Herbert) 98 F (36.7 Herbert) 98.7 F (37.1 Herbert)  TempSrc: Oral Oral Oral Oral  SpO2: 96% 98% 99% 95%  Weight:      Height:        General: Pt is alert, awake, not in acute distress Cardiovascular: RRR, S1/S2 +, no rubs, no gallops Respiratory: CTA bilaterally, no wheezing, no rhonchi Abdominal: Soft, NT, ND, bowel sounds + Extremities: no edema, no cyanosis    The results of significant diagnostics from this hospitalization (including imaging, microbiology, ancillary and laboratory) are listed below for reference.     Microbiology: Recent Results (from the past 240 hour(s))  Urine Culture     Status: None   Collection Time: 01/08/17  7:35 PM  Result Value Ref Range Status   Specimen Description URINE, CLEAN CATCH  Final   Special Requests NONE  Final   Culture NO GROWTH  Final   Report Status 01/10/2017 FINAL  Final     Labs: BNP (last 3 results) No results for input(s): BNP in the last 8760 hours. Basic Metabolic Panel: Recent Labs  Lab 01/07/17 1332 01/07/17 1339 01/09/17 0420  NA 133* 136 138  K 3.8 3.9 4.2  CL 102 102 109  CO2 18*  --  21*  GLUCOSE 116* 117* 106*  BUN 18 21* 17  CREATININE 1.12 1.00 0.98  CALCIUM 9.3  --  8.9   Liver Function Tests: Recent Labs  Lab 01/07/17 1332  AST 43*  ALT 23  ALKPHOS 86  BILITOT 0.9  PROT 7.0  ALBUMIN 3.7   No results for input(s): LIPASE, AMYLASE in the last 168 hours. No results for input(s): AMMONIA in the last 168 hours. CBC: Recent Labs  Lab 01/07/17 1332 01/07/17 1339 01/09/17 0420  WBC 13.9*  --  13.2*  NEUTROABS 11.3*  --   --   HGB 13.9 14.6 12.5*  HCT 40.6 43.0 38.4*  MCV 90.4  --  92.3  PLT 381  --  366   Cardiac Enzymes: No results for input(s): CKTOTAL, CKMB, CKMBINDEX, TROPONINI in the last 168 hours. BNP: Invalid input(s): POCBNP CBG: Recent Labs  Lab 01/07/17 1328  01/10/17 0613  GLUCAP 113* 112*   D-Dimer No results for input(s): DDIMER in the last 72 hours. Hgb A1c Recent Labs    01/07/17 1332  HGBA1C 5.7*   Lipid Profile Recent Labs    01/07/17 1332  CHOL 115  HDL 38*  LDLCALC 62  TRIG 77  CHOLHDL 3.0   Thyroid function studies No results for input(s): TSH, T4TOTAL, T3FREE, THYROIDAB in the last 72 hours.  Invalid input(s): FREET3 Anemia work up No results for input(s): VITAMINB12, FOLATE, FERRITIN, TIBC, IRON, RETICCTPCT in the last 72 hours. Urinalysis    Component Value Date/Time   COLORURINE YELLOW 01/08/2017 1953   APPEARANCEUR CLEAR 01/08/2017 1953   LABSPEC 1.016 01/08/2017 1953   PHURINE 6.0 01/08/2017 1953   GLUCOSEU NEGATIVE 01/08/2017 1953   HGBUR SMALL (A) 01/08/2017 1953   BILIRUBINUR NEGATIVE 01/08/2017 1953   BILIRUBINUR negative 12/03/2016 1421   KETONESUR NEGATIVE 01/08/2017 1953   PROTEINUR NEGATIVE 01/08/2017 1953   UROBILINOGEN 0.2 12/03/2016 1421   UROBILINOGEN 0.2 08/31/2011 1033   NITRITE NEGATIVE 01/08/2017 1953   LEUKOCYTESUR NEGATIVE  01/08/2017 1953   Sepsis Labs Invalid input(s): PROCALCITONIN,  WBC,  LACTICIDVEN Microbiology Recent Results (from the past 240 hour(s))  Urine Culture     Status: None   Collection Time: 01/08/17  7:35 PM  Result Value Ref Range Status   Specimen Description URINE, CLEAN CATCH  Final   Special Requests NONE  Final   Culture NO GROWTH  Final   Report Status 01/10/2017 FINAL  Final     Time coordinating discharge: Over 30 minutes  SIGNED:   Elmarie Shiley, MD  [facility] 01/10/2017, 11:42 AM Pager   If 7PM-7AM, please contact night-coverage [email or web address] Password [email or web address]

## 2017-01-10 NOTE — Clinical Social Work Note (Addendum)
Clinical Social Work Assessment  Patient Details  Name: Herbert Marquez MRN: [id number] Date of Birth: 10/01/1927  Date of referral:  01/10/17               Reason for consult:  Facility Placement                Permission sought to share information with:  Facility Sport and exercise psychologist, Family Supports Permission granted to share information::  Yes, Verbal Permission Granted  Name::     Herbert Marquez::  SNF  Relationship::  Wife  Contact Information:     Housing/Transportation Living arrangements for the past 2 months:  Single Family Home Source of Information:  Spouse Patient Interpreter Needed:  None Criminal Activity/Legal Involvement Pertinent to Current Situation/Hospitalization:  No - Comment as needed Significant Relationships:  Spouse Lives with:  Self, Spouse Do you feel safe going back to the place where you live?  Yes Need for family participation in patient care:  Yes (Comment)(patient not oriented)  Care giving concerns:  Patient has been living at home with spouse, but will need short term rehab at discharge prior to returning home because wife cannot provide assistance for the patient at this time unless he gets stronger.   Social Worker assessment / plan:  CSW met with patient and patient's wife at bedside to discuss recommendation for SNF and confirm that [facility] had a bed available for the patient. CSW to coordinate discharge to [facility].  Employment status:  Retired Forensic scientist:  Medicare PT Recommendations:  [place] / Referral to community resources:  [place]  Patient/Family's Response to care:  Patient's wife agreeable to SNF placement.  Patient/Family's Understanding of and Emotional Response to Diagnosis, Current Treatment, and Prognosis:  Patient's wife was appreciative of information and assistance. Patient's wife discussed how the patient had been over to [facility] in the past and it was an excellent  facility. Patient's wife acknowledged that she hadn't actually told the patient yet, so she would go let him know what was happening. Patient's wife was concerned that the patient would be upset, because he really wants to be able to go home, but there's no way that the family can care for him until he gets stronger.  Emotional Assessment Appearance:  Appears stated age Attitude/Demeanor/Rapport:  Unable to Assess Affect (typically observed):  Unable to Assess Orientation:  Oriented to Place Alcohol / Substance use:  Not Applicable Psych involvement (Current and /or in the community):  No (Comment)  Discharge Needs  Concerns to be addressed:  Care Coordination Readmission within the last 30 days:  No Current discharge risk:  Physical Impairment, Cognitively Impaired Barriers to Discharge:  Continued Medical Work up   [employer], [place] 01/10/2017, 2:55 PM

## 2017-01-10 NOTE — Progress Notes (Signed)
[facility] came to pick up pt to the nursing home as per discharge, night medications given prior to discharge, pt and family reassured, left at 2120. [place], Avayah Raffety Efe

## 2017-01-11 LAB — LEVETIRACETAM LEVEL: Levetiracetam Lvl: 20.7 ug/mL (ref 10.0–40.0)

## 2017-01-16 ENCOUNTER — Other Ambulatory Visit: Payer: Self-pay | Admitting: *Deleted

## 2017-01-16 NOTE — Patient Outreach (Signed)
[facility]) Care Management  01/16/2017  Herbert Marquez 03/09/1927 [id number]   Met with patient spouse at facility. Patient was asleep.  Wife reports patient is doing great and will go home soon.  She does have [place] letter given to spouse at hospital.  [facility] care management program services.  Wife reports no needs at this time but may call in the future if any needs arise.  She did say they may seek private pay assistance in the future.   RNCM left brochure.  Plan to sign off at this time, wife has information for any future needs.   Royetta Crochet. Laymond Purser, RN, BSN, [place] [phone]) Business Cell  [phone]) Toll Free Office

## 2017-01-23 ENCOUNTER — Encounter: Payer: Medicare Other | Admitting: Internal Medicine

## 2017-01-28 ENCOUNTER — Ambulatory Visit: Payer: Medicare Other | Admitting: Nurse Practitioner

## 2017-01-28 NOTE — Progress Notes (Signed)
Subjective:    Patient ID: Herbert Marquez, male    DOB: Jun 14, 1927, 83 y.o.   MRN: [id number]  HPI:  Mr. Kriz is here for f/u- hospitalization (01/07/17-01/10/17) for seizure episode, less likely TIA per Neuro.  Keppra increase to 750 mg BID,continue with eliquis.  EEG  ECHO  LDL-62  HgbA1c5.7   HTN; BP controlled.  Chronic diastolic Dysfunction;stable  CKD stage III: stable Seizure;increasedkeppra.  COPD; stable.  Dementia; continue with Seroquel.  Leukocytosis;  Received steroid injection.  Follow trend Constipation; dulcolax suppository and senna ordered.   He was d/c'd to [facility] for 1.5 weeks, home now. BMP/CBC repeated- 01/17/17, results-stable, WBC 11.0, H/H 11.7/32.8, creat 1.11, GFR 59 He is compliant on all medications and denies SE. He was initially constipated after returning home, and increased his dulcolax/senna and had several days of loose stools that have since subsided.  Now he is having at least one formed stool a day with only "minor softness" and he denies blood in urine/stool or abdominal pain.  He is having increased GERD after eating, we discussed using OTC Zantac and not overeating. He denies CP/increased dyspnea/dizziness/palpitations. He is concerned about "losing strength" and will resume home PT tomorrow. He reports quality sleep aided by Quetiapine 25mg  nightly. Wife at [facility] during [place] They have been married 37 years! Upcoming appt: [facility]/Dr. Erlinda Hong 02/12/17 Phys Med/Dr. Letta Pate 02/14/17 Cards/Dr. Cathie Olden 03/12/17  Patient Care Team    Relationship Specialty Notifications Start End  [name], NP PCP - General Family Medicine  03/20/16   Nahser, Wonda Cheng, MD Consulting Physician Cardiology  03/20/16   Rod Can, MD Consulting Physician Orthopedic Surgery  03/20/16   Jarome Matin, MD Consulting Physician Dermatology  03/20/16   Nickie Retort, MD Consulting Physician Urology  03/20/16   Shon Hough, MD Consulting  Physician Ophthalmology  03/20/16     Patient Active Problem List   Diagnosis Date Noted  . Pressure injury of skin 01/08/2017  . Stroke ([place]) 01/07/2017  . Ischemic stroke ([place])   . Second degree Mobitz II AV block 10/23/2016  . Seizures ([place]) 10/09/2016  . Healthcare maintenance 10/01/2016  . Restlessness and agitation 07/02/2016  . Vascular dementia with behavior disturbance 06/29/2016  . Hemiparesis and other late effects of cerebrovascular accident ([place]) 06/29/2016  . Embolic stroke ([place]) 72/53/6644  . Right hemiparesis ([place])   . History of CVA with residual deficit   . Chronic anticoagulation   . Atrial fibrillation with rapid ventricular response ([place])   . Seizure prophylaxis   . Diastolic dysfunction   . Bacteremia due to Gram-positive bacteria   . Altered mental status   . Dementia without behavioral disturbance   . Leukocytosis   . Acute blood loss anemia   . Acute encephalopathy 06/04/2016  . PAF (paroxysmal atrial fibrillation) ([place])   . Iron deficiency anemia 04/04/2016  . History of CVA (cerebrovascular accident) 04/04/2016  . Cerebral hemorrhage (HCC) w/ SDH s/p IV tPA   . Coronary artery disease involving native coronary artery of native heart without angina pectoris   . Orthostatic hypotension   . History of right hip replacement   . Acute ischemic stroke ([place]) - L temporal lobe s/p tPA 03/30/2016  . BPH (benign prostatic hyperplasia) 11/25/2015  . GERD (gastroesophageal reflux disease) 11/25/2015  . CKD (chronic kidney disease), stage II 11/25/2015  . Overactive bladder   . Cerebrovascular accident (CVA) ([place]) 09/18/2015  . Gait disturbance, post-stroke 06/23/2015  . Ataxia due to recent stroke  06/23/2015  . Thalamic infarction ([place]) 06/21/2015  . Benign essential HTN   . Type 2 diabetes mellitus with complication, without long-term current use of insulin ([place])   . Dysphagia as late effect of cerebrovascular disease   . Hyponatremia   . HLD  (hyperlipidemia)   . COPD (chronic obstructive pulmonary disease) ([place]) 03/09/2011     Past Medical History:  Diagnosis Date  . Arthritis    "pretty much all over"   . Basal cell carcinoma    "several burned off his body, face, head"  . BPH (benign prostatic hypertrophy)   . Coronary artery disease    a. s/p PCI of RCA in 2006  . CVA (cerebral infarction)    a. 06/2015: left thalamic and bilateral PCA  . GERD (gastroesophageal reflux disease)   . Hyperlipidemia   . Hypertension   . Presence of permanent cardiac pacemaker   . Stroke ([place])   . TIA (transient ischemic attack)    Approximately 6 weeks post-cardiac catheterization.      Past Surgical History:  Procedure Laterality Date  . CARDIOVASCULAR STRESS TEST  07/01/2007   EF 74%  . CATARACT EXTRACTION, BILATERAL    . CORONARY ANGIOPLASTY WITH STENT PLACEMENT  10/2004   stenting x 2 to RCA  . FEMUR IM NAIL Right 11/26/2015   Procedure: INTRAMEDULLARY RIGHT (IM) NAIL FEMORAL;  Surgeon: Rod Can, MD;  Location: [facility];  Service: Orthopedics;  Laterality: Right;  . FRACTURE SURGERY    . HERNIA REPAIR    . HIP ARTHROPLASTY  03/09/2011   Procedure: ARTHROPLASTY BIPOLAR HIP;  Surgeon: Mauri Pole;  Location: [facility];  Service: Orthopedics;  Laterality: Left;  . INSERT / REPLACE / REMOVE PACEMAKER  10/23/2016  . LAPAROSCOPIC INCISIONAL / UMBILICAL / VENTRAL HERNIA REPAIR     "below his naval"  . PACEMAKER IMPLANT N/A 10/23/2016   Procedure: Pacemaker Implant;  Surgeon: Thompson Grayer, MD;  Location: [facility];  Service: Cardiovascular;  Laterality: N/A;     Family History  Problem Relation Age of Onset  . Hypertension Mother   . Lung cancer Father   . Lung cancer Brother      Social History   Substance and Sexual Activity  Drug Use No     Social History   Substance and Sexual Activity  Alcohol Use Yes  . Alcohol/week: 4.8 oz  . Types: 7 Glasses of wine, 1 Cans of beer per week   Comment: 1/2  BEER AND 1 WINE     Social History   Tobacco Use  Smoking Status Former Smoker  . Last attempt to quit: 03/06/1971  . Years since quitting: 45.9  Smokeless Tobacco Never Used     Outpatient Encounter Medications as of 01/29/2017  Medication Sig  . acetaminophen (TYLENOL) 325 MG tablet Take 2 tablets (650 mg total) every 4 (four) hours as needed by mouth for mild pain (or temp > 37.5 C (99.5 F)).  [name] apixaban (ELIQUIS) 5 MG TABS tablet Take 1 tablet (5 mg total) by mouth 2 (two) times daily. Resume 10/25/16 evening dose  . aspirin EC 81 MG tablet Take 1 tablet (81 mg total) by mouth daily. (Patient taking differently: Take 81 mg every evening by mouth. )  . atorvastatin (LIPITOR) 80 MG tablet TAKE 1 TABLET BY MOUTH DAILY.  [name] levETIRAcetam (KEPPRA) 750 MG tablet Take 1 tablet (750 mg total) 2 (two) times daily by mouth.  . Multiple Vitamin (MULTIVITAMIN WITH MINERALS) TABS tablet  Take 1 tablet by mouth daily.  . nitroGLYCERIN (NITROSTAT) 0.4 MG SL tablet Place 1 tablet (0.4 mg total) under the tongue every 5 (five) minutes as needed for chest pain.  . polyvinyl alcohol (ARTIFICIAL TEARS) 1.4 % ophthalmic solution Place 1 drop into both eyes daily as needed for dry eyes.  [name] QUEtiapine (SEROQUEL) 25 MG tablet TAKE 1 TABLET BY MOUTH AT BEDTIME.  . [DISCONTINUED] Phenazopyridine HCl (AZO TABS PO) Take 1 tablet 3 (three) times daily by mouth.  . [DISCONTINUED] senna-docusate (SENOKOT-S) 8.6-50 MG tablet Take 1 tablet daily by mouth.   No facility-administered encounter medications on file as of 01/29/2017.     Allergies: Patient has no known allergies.  There is no height or weight on file to calculate BMI.  Blood pressure 122/77, pulse 80, temperature (!) 97.5 F (36.4 C), temperature source Oral.  Review of Systems  Constitutional: Positive for activity change, fatigue and fever. Negative for appetite change, chills, diaphoresis and unexpected weight change.  HENT: Negative for  congestion.   Eyes: Negative for visual disturbance.  Respiratory: Positive for shortness of breath. Negative for cough, chest tightness, wheezing and stridor.        SOB r/t COPD, no increase in sx's  Cardiovascular: Negative for chest pain, palpitations and leg swelling.  Gastrointestinal: Positive for diarrhea. Negative for abdominal distention, abdominal pain, blood in stool, constipation, nausea and vomiting.  Endocrine: Negative for cold intolerance, heat intolerance, polydipsia, polyphagia and polyuria.  Genitourinary: Negative for difficulty urinating, flank pain and hematuria.  Musculoskeletal: Positive for arthralgias, back pain, gait problem, joint swelling, myalgias, neck pain and neck stiffness.  Skin: Negative for color change, pallor, rash and wound.  Neurological: Positive for weakness. Negative for dizziness, seizures, speech difficulty and headaches.  Hematological: Does not bruise/bleed easily.  Psychiatric/Behavioral: Negative for dysphoric mood, self-injury, sleep disturbance and suicidal ideas. The patient is not nervous/anxious and is not hyperactive.        Objective:   Physical Exam  Constitutional: He is oriented to person, place, and time. He appears well-developed and well-nourished. No distress.  Cardiovascular: Normal rate, regular rhythm, normal heart sounds and intact distal pulses.  No murmur heard. Pulmonary/Chest: Effort normal and breath sounds normal. No respiratory distress. He has no wheezes. He has no rales. He exhibits no tenderness.  Abdominal: Soft. Bowel sounds are normal. He exhibits no distension and no mass. There is no tenderness. There is no rebound and no guarding.  Neurological: He is alert and oriented to person, place, and time.  Skin: Skin is warm and dry. No rash noted. He is not diaphoretic. No erythema. No pallor.  Psychiatric: He has a normal mood and affect. His behavior is normal. Judgment and thought content normal.  Nursing note  and vitals reviewed.         Assessment & Plan:  No diagnosis found. 1. Chronic anticoagulation   2. Healthcare maintenance   3. Hyperlipidemia, unspecified hyperlipidemia type   4. Restlessness and agitation     Healthcare maintenance Please continue all medications as directed. Please increase daily movement as directed by your home PT. 1) If stool continues to be soft for another week, please all clinic 2) If you notice blood in urine/stool, please call clinic 3) Please try OTC Zantac for indigestion Please schedule complete physical in the next month. Please call us with any questions/concerns.  HLD (hyperlipidemia) 01/07/17: Cholesterol 0 - 200 mg/dL 115   Triglycerides <150 mg/dL 77   HDL >40 mg/dL  38 Abnormally low    Total CHOL/HDL Ratio RATIO 3.0   VLDL 0 - 40 mg/dL 15   LDL Cholesterol 0 - 99 mg/dL 62   Comment:       Continue Atorvastatin 80mg  daily, denies myalgias  Chronic anticoagulation 01/17/17 H/h 11.7/32.8 plt 378 He denies easy bleeding Currently on Apixaban 5mg  BID [facility]/Dr. Erlinda Hong- 02/12/17  FOLLOW-UP:  Return in about 1 month (around 02/28/2017) for CPE.

## 2017-01-29 ENCOUNTER — Encounter: Payer: Self-pay | Admitting: Adult Health

## 2017-01-29 ENCOUNTER — Ambulatory Visit (INDEPENDENT_AMBULATORY_CARE_PROVIDER_SITE_OTHER): Payer: Medicare Other | Admitting: Adult Health

## 2017-01-29 VITALS — BP 122/77 | HR 80 | Temp 97.5°F

## 2017-01-29 DIAGNOSIS — Z7901 Long term (current) use of anticoagulants: Secondary | ICD-10-CM | POA: Diagnosis not present

## 2017-01-29 DIAGNOSIS — I639 Cerebral infarction, unspecified: Secondary | ICD-10-CM

## 2017-01-29 DIAGNOSIS — E785 Hyperlipidemia, unspecified: Secondary | ICD-10-CM | POA: Diagnosis not present

## 2017-01-29 DIAGNOSIS — R451 Restlessness and agitation: Secondary | ICD-10-CM

## 2017-01-29 DIAGNOSIS — Z0000 Encounter for general adult medical examination without abnormal findings: Secondary | ICD-10-CM

## 2017-01-29 NOTE — Assessment & Plan Note (Signed)
01/07/17: Cholesterol 0 - 200 mg/dL 115   Triglycerides <150 mg/dL 77   HDL >40 mg/dL 38 Abnormally low    Total CHOL/HDL Ratio RATIO 3.0   VLDL 0 - 40 mg/dL 15   LDL Cholesterol 0 - 99 mg/dL 62   Comment:       Continue Atorvastatin 80mg  daily, denies myalgias

## 2017-01-29 NOTE — Assessment & Plan Note (Signed)
01/17/17 H/h 11.7/32.8 plt 378 He denies easy bleeding Currently on Apixaban 5mg  BID [facility]/Dr. Erlinda Hong- 02/12/17

## 2017-01-29 NOTE — Assessment & Plan Note (Signed)
Please continue all medications as directed. Please increase daily movement as directed by your home PT. 1) If stool continues to be soft for another week, please all clinic 2) If you notice blood in urine/stool, please call clinic 3) Please try OTC Zantac for indigestion Please schedule complete physical in the next month. Please call us with any questions/concerns.

## 2017-01-29 NOTE — Patient Instructions (Addendum)
Heart-Healthy Eating Plan Many factors influence your heart health, including eating and exercise habits. Heart (coronary) risk increases with abnormal blood fat (lipid) levels. Heart-healthy meal planning includes limiting unhealthy fats, increasing healthy fats, and making other small dietary changes. This includes maintaining a healthy body weight to help keep lipid levels within a normal range. What is my plan? Your health care provider recommends that you:  Get no more than ___25___% of the total calories in your daily diet from fat.  Limit your intake of saturated fat to less than ___5__% of your total calories each day.  Limit the amount of cholesterol in your diet to less than __300__ mg per day.  What types of fat should I choose?  Choose healthy fats more often. Choose monounsaturated and polyunsaturated fats, such as olive oil and canola oil, flaxseeds, walnuts, almonds, and seeds.  Eat more omega-3 fats. Good choices include salmon, mackerel, sardines, tuna, flaxseed oil, and ground flaxseeds. Aim to eat fish at least two times each week.  Limit saturated fats. Saturated fats are primarily found in animal products, such as meats, butter, and cream. Plant sources of saturated fats include palm oil, palm kernel oil, and coconut oil.  Avoid foods with partially hydrogenated oils in them. These contain trans fats. Examples of foods that contain trans fats are stick margarine, some tub margarines, cookies, crackers, and other baked goods. What general guidelines do I need to follow?  Check food labels carefully to identify foods with trans fats or high amounts of saturated fat.  Fill one half of your plate with vegetables and green salads. Eat 4-5 servings of vegetables per day. A serving of vegetables equals 1 cup of raw leafy vegetables,  cup of raw or cooked cut-up vegetables, or  cup of vegetable juice.  Fill one fourth of your plate with whole grains. Look for the word "whole"  as the first word in the ingredient list.  Fill one fourth of your plate with lean protein foods.  Eat 4-5 servings of fruit per day. A serving of fruit equals one medium whole fruit,  cup of dried fruit,  cup of fresh, frozen, or canned fruit, or  cup of 100% fruit juice.  Eat more foods that contain soluble fiber. Examples of foods that contain this type of fiber are apples, broccoli, carrots, beans, peas, and barley. Aim to get 20-30 g of fiber per day.  Eat more home-cooked food and less restaurant, buffet, and fast food.  Limit or avoid alcohol.  Limit foods that are high in starch and sugar.  Avoid fried foods.  Cook foods by using methods other than frying. Baking, boiling, grilling, and broiling are all great options. Other fat-reducing suggestions include: ? Removing the skin from poultry. ? Removing all visible fats from meats. ? Skimming the fat off of stews, soups, and gravies before serving them. ? Steaming vegetables in water or broth.  Lose weight if you are overweight. Losing just 5-10% of your initial body weight can help your overall health and prevent diseases such as diabetes and heart disease.  Increase your consumption of nuts, legumes, and seeds to 4-5 servings per week. One serving of dried beans or legumes equals  cup after being cooked, one serving of nuts equals 1 ounces, and one serving of seeds equals  ounce or 1 tablespoon.  You may need to monitor your salt (sodium) intake, especially if you have high blood pressure. Talk with your health care provider or dietitian to get  more information about reducing sodium. What foods can I eat? Grains  Breads, including [place], white, pita, wheat, raisin, rye, oatmeal, and [place]. Tortillas that are neither fried nor made with lard or trans fat. Low-fat rolls, including hotdog and hamburger buns and English muffins. Biscuits. Muffins. Waffles. Pancakes. Light popcorn. Whole-grain cereals. Flatbread. Melba  toast. Pretzels. Breadsticks. Rusks. Low-fat snacks and crackers, including oyster, saltine, matzo, graham, animal, and rye. Rice and pasta, including brown rice and those that are made with whole wheat. Vegetables All vegetables. Fruits All fruits, but limit coconut. Meats and Other Protein Sources Lean, well-trimmed beef, veal, pork, and lamb. Chicken and [place] without skin. All fish and shellfish. Wild duck, rabbit, pheasant, and venison. Egg whites or low-cholesterol egg substitutes. Dried beans, peas, lentils, and tofu.Seeds and most nuts. Dairy Low-fat or nonfat cheeses, including ricotta, string, and mozzarella. Skim or 1% milk that is liquid, powdered, or evaporated. Buttermilk that is made with low-fat milk. Nonfat or low-fat yogurt. Beverages Mineral water. Diet carbonated beverages. Sweets and Desserts Sherbets and fruit ices. Honey, jam, marmalade, jelly, and syrups. Meringues and gelatins. Pure sugar candy, such as hard candy, jelly beans, gumdrops, mints, marshmallows, and small amounts of dark chocolate. [employer] Eat all sweets and desserts in moderation. Fats and Oils Nonhydrogenated (trans-free) margarines. Vegetable oils, including soybean, sesame, sunflower, olive, peanut, safflower, corn, canola, and cottonseed. Salad dressings or mayonnaise that are made with a vegetable oil. Limit added fats and oils that you use for cooking, baking, salads, and as spreads. Other Cocoa powder. Coffee and tea. All seasonings and condiments. The items listed above may not be a complete list of recommended foods or beverages. Contact your dietitian for more options. What foods are not recommended? Grains Breads that are made with saturated or trans fats, oils, or whole milk. Croissants. Butter rolls. Cheese breads. Sweet rolls. Donuts. Buttered popcorn. Chow mein noodles. High-fat crackers, such as cheese or butter crackers. Meats and Other Protein Sources Fatty meats, such as  hotdogs, short ribs, sausage, spareribs, bacon, ribeye roast or steak, and mutton. High-fat deli meats, such as salami and bologna. Caviar. Domestic duck and goose. Organ meats, such as kidney, liver, sweetbreads, brains, gizzard, chitterlings, and heart. Dairy Cream, sour cream, cream cheese, and creamed cottage cheese. Whole milk cheeses, including blue (bleu), Monterey Jack, [place], [place], American, [place], Swiss, [place], [place], and [place]. Whole or 2% milk that is liquid, evaporated, or condensed. Whole buttermilk. Cream sauce or high-fat cheese sauce. Yogurt that is made from whole milk. Beverages Regular sodas and drinks with added sugar. Sweets and Desserts Frosting. Pudding. Cookies. Cakes other than angel food cake. Candy that has milk chocolate or white chocolate, hydrogenated fat, butter, coconut, or unknown ingredients. Buttered syrups. Full-fat ice cream or ice cream drinks. Fats and Oils Gravy that has suet, meat fat, or shortening. Cocoa butter, hydrogenated oils, palm oil, coconut oil, palm kernel oil. These can often be found in baked products, candy, fried foods, nondairy creamers, and whipped toppings. Solid fats and shortenings, including bacon fat, salt pork, lard, and butter. Nondairy cream substitutes, such as coffee creamers and sour cream substitutes. Salad dressings that are made of unknown oils, cheese, or sour cream. The items listed above may not be a complete list of foods and beverages to avoid. Contact your dietitian for more information. This information is not intended to replace advice given to you by your health care provider. Make sure you discuss any questions you have with your health care  provider. Document Released: 11/29/2007 Document Revised: 09/09/2015 Document Reviewed: 08/13/2013 Elsevier Interactive Patient Education  2017 [employer].   Exercising to [employer] Exercising regularly is important. It has many health benefits, such  as:  Improving your overall fitness, flexibility, and endurance.  Increasing your bone density.  Helping with weight control.  Decreasing your body fat.  Increasing your muscle strength.  Reducing stress and tension.  Improving your overall health.  In order to become healthy and stay healthy, it is recommended that you do moderate-intensity and vigorous-intensity exercise. You can tell that you are exercising at a moderate intensity if you have a higher heart rate and faster breathing, but you are still able to hold a conversation. You can tell that you are exercising at a vigorous intensity if you are breathing much harder and faster and cannot hold a conversation while exercising. How often should I exercise? Choose an activity that you enjoy and set realistic goals. Your health care provider can help you to make an activity plan that works for you. Exercise regularly as directed by your health care provider. This may include:  Doing resistance training twice each week, such as: ? Push-ups. ? Sit-ups. ? Lifting weights. ? Using resistance bands.  Doing a given intensity of exercise for a given amount of time. Choose from these options: ? 150 minutes of moderate-intensity exercise every week. ? 75 minutes of vigorous-intensity exercise every week. ? A mix of moderate-intensity and vigorous-intensity exercise every week.  Children, pregnant women, people who are out of shape, people who are overweight, and older adults may need to consult a health care provider for individual recommendations. If you have any sort of medical condition, be sure to consult your health care provider before starting a new exercise program. What are some exercise ideas? Some moderate-intensity exercise ideas include:  Walking at a rate of 1 mile in 15 minutes.  Biking.  Hiking.  Golfing.  Dancing.  Some vigorous-intensity exercise ideas include:  Walking at a rate of at least 4.5 miles per  hour.  Jogging or running at a rate of 5 miles per hour.  Biking at a rate of at least 10 miles per hour.  Lap swimming.  Roller-skating or in-line skating.  Cross-country skiing.  Vigorous competitive sports, such as football, basketball, and soccer.  Jumping rope.  Aerobic dancing.  What are some everyday activities that can help me to get exercise?  [place] work, such as: ? Pushing a Conservation officer, nature. ? Raking and bagging leaves.  Washing and waxing your car.  Pushing a stroller.  Shoveling snow.  Gardening.  Washing windows or floors. How can I be more active in my day-to-day activities?  Use the stairs instead of the elevator.  Take a walk during your lunch break.  If you drive, park your car farther away from work or school.  If you take public transportation, get off one stop early and walk the rest of the way.  Make all of your phone calls while standing up and walking around.  Get up, stretch, and walk around every 30 minutes throughout the day. What guidelines should I follow while exercising?  Do not exercise so much that you hurt yourself, feel dizzy, or get very short of breath.  Consult your health care provider before starting a new exercise program.  Wear comfortable clothes and shoes with good support.  Drink plenty of water while you exercise to prevent dehydration or heat stroke. Body water is lost during  exercise and must be replaced.  Work out until you breathe faster and your heart beats faster. This information is not intended to replace advice given to you by your health care provider. Make sure you discuss any questions you have with your health care provider. Document Released: 03/24/2010 Document Revised: 07/28/2015 Document Reviewed: 07/23/2013 Elsevier Interactive Patient Education  [employer].  Please continue all medications as directed. Please increase daily movement as directed by your home PT. 1) If stool continues to be  soft for another week, please all clinic 2) If you notice blood in urine/stool, please call clinic 3) Please try OTC Zantac for indigestion Please schedule complete physical in the next month. Please call us with any questions/concerns. NICE TO SEE YOU!

## 2017-01-30 ENCOUNTER — Telehealth: Payer: Self-pay | Admitting: Adult Health

## 2017-01-30 DIAGNOSIS — Z87891 Personal history of nicotine dependence: Secondary | ICD-10-CM | POA: Diagnosis not present

## 2017-01-30 DIAGNOSIS — N183 Chronic kidney disease, stage 3 (moderate): Secondary | ICD-10-CM | POA: Diagnosis not present

## 2017-01-30 DIAGNOSIS — I480 Paroxysmal atrial fibrillation: Secondary | ICD-10-CM | POA: Diagnosis not present

## 2017-01-30 DIAGNOSIS — I2510 Atherosclerotic heart disease of native coronary artery without angina pectoris: Secondary | ICD-10-CM | POA: Diagnosis not present

## 2017-01-30 DIAGNOSIS — F0390 Unspecified dementia without behavioral disturbance: Secondary | ICD-10-CM | POA: Diagnosis not present

## 2017-01-30 DIAGNOSIS — Z7901 Long term (current) use of anticoagulants: Secondary | ICD-10-CM | POA: Diagnosis not present

## 2017-01-30 DIAGNOSIS — Z7982 Long term (current) use of aspirin: Secondary | ICD-10-CM | POA: Diagnosis not present

## 2017-01-30 DIAGNOSIS — I130 Hypertensive heart and chronic kidney disease with heart failure and stage 1 through stage 4 chronic kidney disease, or unspecified chronic kidney disease: Secondary | ICD-10-CM | POA: Diagnosis not present

## 2017-01-30 DIAGNOSIS — J449 Chronic obstructive pulmonary disease, unspecified: Secondary | ICD-10-CM | POA: Diagnosis not present

## 2017-01-30 DIAGNOSIS — I69351 Hemiplegia and hemiparesis following cerebral infarction affecting right dominant side: Secondary | ICD-10-CM | POA: Diagnosis not present

## 2017-01-30 DIAGNOSIS — K219 Gastro-esophageal reflux disease without esophagitis: Secondary | ICD-10-CM | POA: Diagnosis not present

## 2017-01-30 DIAGNOSIS — I5030 Unspecified diastolic (congestive) heart failure: Secondary | ICD-10-CM | POA: Diagnosis not present

## 2017-01-30 DIAGNOSIS — G40909 Epilepsy, unspecified, not intractable, without status epilepticus: Secondary | ICD-10-CM | POA: Diagnosis not present

## 2017-01-30 DIAGNOSIS — N400 Enlarged prostate without lower urinary tract symptoms: Secondary | ICD-10-CM | POA: Diagnosis not present

## 2017-01-30 DIAGNOSIS — E785 Hyperlipidemia, unspecified: Secondary | ICD-10-CM | POA: Diagnosis not present

## 2017-01-30 DIAGNOSIS — E1122 Type 2 diabetes mellitus with diabetic chronic kidney disease: Secondary | ICD-10-CM | POA: Diagnosis not present

## 2017-01-30 NOTE — Telephone Encounter (Signed)
Received call from [facility] @ [place] [phone] request (Verbal Josem Kaufmann) for Home health PT -- PT requested 2xs weekly for 4 weeks-- also request Miralax treatment to aid w/ constipation-- Pls call [name] to give Verbal authorization at [phone].  -glh

## 2017-01-30 NOTE — Telephone Encounter (Signed)
Verbal orders given for PT an Miralax PRN per Mina Marble, NP.  Charyl Bigger, CMA

## 2017-01-31 ENCOUNTER — Ambulatory Visit: Payer: Medicare Other | Admitting: Physical Medicine & Rehabilitation

## 2017-02-04 DIAGNOSIS — I2510 Atherosclerotic heart disease of native coronary artery without angina pectoris: Secondary | ICD-10-CM | POA: Diagnosis not present

## 2017-02-04 DIAGNOSIS — I69351 Hemiplegia and hemiparesis following cerebral infarction affecting right dominant side: Secondary | ICD-10-CM | POA: Diagnosis not present

## 2017-02-04 DIAGNOSIS — G40909 Epilepsy, unspecified, not intractable, without status epilepticus: Secondary | ICD-10-CM | POA: Diagnosis not present

## 2017-02-04 DIAGNOSIS — N183 Chronic kidney disease, stage 3 (moderate): Secondary | ICD-10-CM | POA: Diagnosis not present

## 2017-02-04 DIAGNOSIS — I130 Hypertensive heart and chronic kidney disease with heart failure and stage 1 through stage 4 chronic kidney disease, or unspecified chronic kidney disease: Secondary | ICD-10-CM | POA: Diagnosis not present

## 2017-02-04 DIAGNOSIS — I5030 Unspecified diastolic (congestive) heart failure: Secondary | ICD-10-CM | POA: Diagnosis not present

## 2017-02-07 DIAGNOSIS — N183 Chronic kidney disease, stage 3 (moderate): Secondary | ICD-10-CM | POA: Diagnosis not present

## 2017-02-07 DIAGNOSIS — G40909 Epilepsy, unspecified, not intractable, without status epilepticus: Secondary | ICD-10-CM | POA: Diagnosis not present

## 2017-02-07 DIAGNOSIS — I5030 Unspecified diastolic (congestive) heart failure: Secondary | ICD-10-CM | POA: Diagnosis not present

## 2017-02-07 DIAGNOSIS — I69351 Hemiplegia and hemiparesis following cerebral infarction affecting right dominant side: Secondary | ICD-10-CM | POA: Diagnosis not present

## 2017-02-07 DIAGNOSIS — I2510 Atherosclerotic heart disease of native coronary artery without angina pectoris: Secondary | ICD-10-CM | POA: Diagnosis not present

## 2017-02-07 DIAGNOSIS — I130 Hypertensive heart and chronic kidney disease with heart failure and stage 1 through stage 4 chronic kidney disease, or unspecified chronic kidney disease: Secondary | ICD-10-CM | POA: Diagnosis not present

## 2017-02-12 ENCOUNTER — Ambulatory Visit: Payer: Medicare Other | Admitting: Neurology

## 2017-02-14 ENCOUNTER — Ambulatory Visit: Payer: Medicare Other | Admitting: Physical Medicine & Rehabilitation

## 2017-02-14 DIAGNOSIS — I5030 Unspecified diastolic (congestive) heart failure: Secondary | ICD-10-CM | POA: Diagnosis not present

## 2017-02-14 DIAGNOSIS — N183 Chronic kidney disease, stage 3 (moderate): Secondary | ICD-10-CM | POA: Diagnosis not present

## 2017-02-14 DIAGNOSIS — I2510 Atherosclerotic heart disease of native coronary artery without angina pectoris: Secondary | ICD-10-CM | POA: Diagnosis not present

## 2017-02-14 DIAGNOSIS — G40909 Epilepsy, unspecified, not intractable, without status epilepticus: Secondary | ICD-10-CM | POA: Diagnosis not present

## 2017-02-14 DIAGNOSIS — I69351 Hemiplegia and hemiparesis following cerebral infarction affecting right dominant side: Secondary | ICD-10-CM | POA: Diagnosis not present

## 2017-02-14 DIAGNOSIS — I130 Hypertensive heart and chronic kidney disease with heart failure and stage 1 through stage 4 chronic kidney disease, or unspecified chronic kidney disease: Secondary | ICD-10-CM | POA: Diagnosis not present

## 2017-02-15 DIAGNOSIS — I69351 Hemiplegia and hemiparesis following cerebral infarction affecting right dominant side: Secondary | ICD-10-CM | POA: Diagnosis not present

## 2017-02-15 DIAGNOSIS — I5030 Unspecified diastolic (congestive) heart failure: Secondary | ICD-10-CM | POA: Diagnosis not present

## 2017-02-15 DIAGNOSIS — I2510 Atherosclerotic heart disease of native coronary artery without angina pectoris: Secondary | ICD-10-CM | POA: Diagnosis not present

## 2017-02-15 DIAGNOSIS — N183 Chronic kidney disease, stage 3 (moderate): Secondary | ICD-10-CM | POA: Diagnosis not present

## 2017-02-15 DIAGNOSIS — G40909 Epilepsy, unspecified, not intractable, without status epilepticus: Secondary | ICD-10-CM | POA: Diagnosis not present

## 2017-02-15 DIAGNOSIS — I130 Hypertensive heart and chronic kidney disease with heart failure and stage 1 through stage 4 chronic kidney disease, or unspecified chronic kidney disease: Secondary | ICD-10-CM | POA: Diagnosis not present

## 2017-02-18 DIAGNOSIS — I130 Hypertensive heart and chronic kidney disease with heart failure and stage 1 through stage 4 chronic kidney disease, or unspecified chronic kidney disease: Secondary | ICD-10-CM | POA: Diagnosis not present

## 2017-02-18 DIAGNOSIS — I5030 Unspecified diastolic (congestive) heart failure: Secondary | ICD-10-CM | POA: Diagnosis not present

## 2017-02-18 DIAGNOSIS — I2510 Atherosclerotic heart disease of native coronary artery without angina pectoris: Secondary | ICD-10-CM | POA: Diagnosis not present

## 2017-02-18 DIAGNOSIS — G40909 Epilepsy, unspecified, not intractable, without status epilepticus: Secondary | ICD-10-CM | POA: Diagnosis not present

## 2017-02-18 DIAGNOSIS — I69351 Hemiplegia and hemiparesis following cerebral infarction affecting right dominant side: Secondary | ICD-10-CM | POA: Diagnosis not present

## 2017-02-18 DIAGNOSIS — N183 Chronic kidney disease, stage 3 (moderate): Secondary | ICD-10-CM | POA: Diagnosis not present

## 2017-02-21 DIAGNOSIS — I69351 Hemiplegia and hemiparesis following cerebral infarction affecting right dominant side: Secondary | ICD-10-CM | POA: Diagnosis not present

## 2017-02-21 DIAGNOSIS — G40909 Epilepsy, unspecified, not intractable, without status epilepticus: Secondary | ICD-10-CM | POA: Diagnosis not present

## 2017-02-21 DIAGNOSIS — I2510 Atherosclerotic heart disease of native coronary artery without angina pectoris: Secondary | ICD-10-CM | POA: Diagnosis not present

## 2017-02-21 DIAGNOSIS — N183 Chronic kidney disease, stage 3 (moderate): Secondary | ICD-10-CM | POA: Diagnosis not present

## 2017-02-21 DIAGNOSIS — I5030 Unspecified diastolic (congestive) heart failure: Secondary | ICD-10-CM | POA: Diagnosis not present

## 2017-02-21 DIAGNOSIS — I130 Hypertensive heart and chronic kidney disease with heart failure and stage 1 through stage 4 chronic kidney disease, or unspecified chronic kidney disease: Secondary | ICD-10-CM | POA: Diagnosis not present

## 2017-02-22 ENCOUNTER — Other Ambulatory Visit: Payer: Self-pay | Admitting: Physical Medicine & Rehabilitation

## 2017-02-22 DIAGNOSIS — Z950 Presence of cardiac pacemaker: Secondary | ICD-10-CM | POA: Insufficient documentation

## 2017-02-22 DIAGNOSIS — E785 Hyperlipidemia, unspecified: Secondary | ICD-10-CM | POA: Insufficient documentation

## 2017-02-22 DIAGNOSIS — C4491 Basal cell carcinoma of skin, unspecified: Secondary | ICD-10-CM | POA: Insufficient documentation

## 2017-02-22 DIAGNOSIS — G459 Transient cerebral ischemic attack, unspecified: Secondary | ICD-10-CM | POA: Insufficient documentation

## 2017-02-22 DIAGNOSIS — M1990 Unspecified osteoarthritis, unspecified site: Secondary | ICD-10-CM | POA: Insufficient documentation

## 2017-02-22 DIAGNOSIS — I10 Essential (primary) hypertension: Secondary | ICD-10-CM | POA: Insufficient documentation

## 2017-02-22 DIAGNOSIS — I2510 Atherosclerotic heart disease of native coronary artery without angina pectoris: Secondary | ICD-10-CM | POA: Insufficient documentation

## 2017-02-25 DIAGNOSIS — G40909 Epilepsy, unspecified, not intractable, without status epilepticus: Secondary | ICD-10-CM | POA: Diagnosis not present

## 2017-02-25 DIAGNOSIS — I2510 Atherosclerotic heart disease of native coronary artery without angina pectoris: Secondary | ICD-10-CM | POA: Diagnosis not present

## 2017-02-25 DIAGNOSIS — I130 Hypertensive heart and chronic kidney disease with heart failure and stage 1 through stage 4 chronic kidney disease, or unspecified chronic kidney disease: Secondary | ICD-10-CM | POA: Diagnosis not present

## 2017-02-25 DIAGNOSIS — N183 Chronic kidney disease, stage 3 (moderate): Secondary | ICD-10-CM | POA: Diagnosis not present

## 2017-02-25 DIAGNOSIS — I5030 Unspecified diastolic (congestive) heart failure: Secondary | ICD-10-CM | POA: Diagnosis not present

## 2017-02-25 DIAGNOSIS — I69351 Hemiplegia and hemiparesis following cerebral infarction affecting right dominant side: Secondary | ICD-10-CM | POA: Diagnosis not present

## 2017-02-27 ENCOUNTER — Telehealth: Payer: Self-pay | Admitting: Adult Health

## 2017-02-27 IMAGING — US US RENAL
1 series · 14 of 25 positions shown · non-contrast
Comparison: CT, 04/29/2008

CLINICAL DATA: Acute renal failure.

EXAM:
RENAL / URINARY TRACT ULTRASOUND COMPLETE

[Series 1: us renal · 0.28mm/px · 14 of 28 slices shown]
[im 1/28]
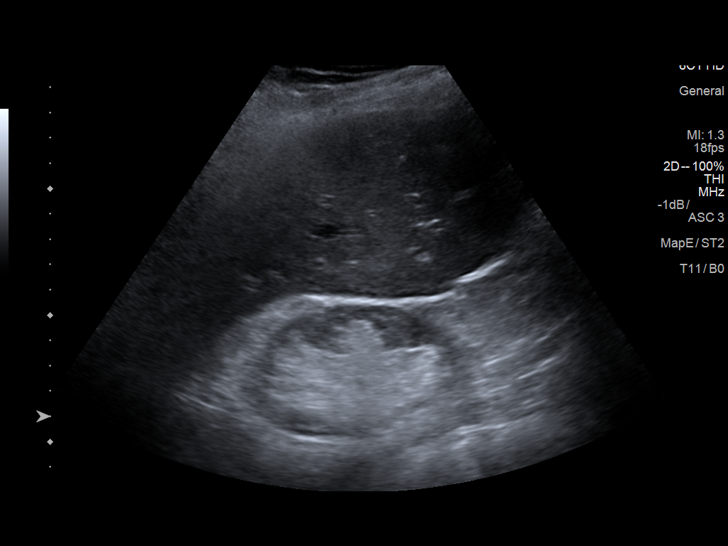
[im 3/28]
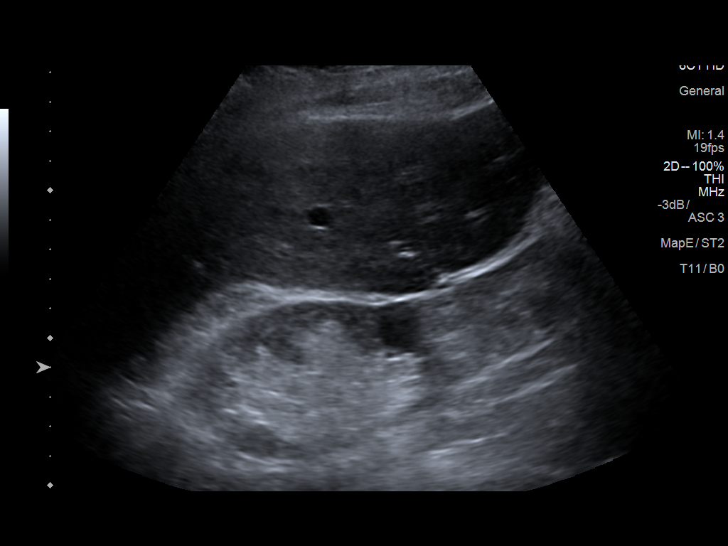
[im 5/28]
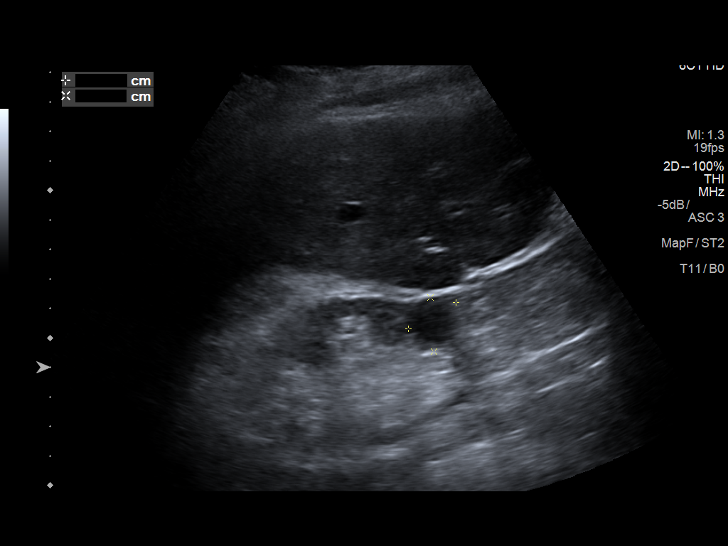
[im 7/28]
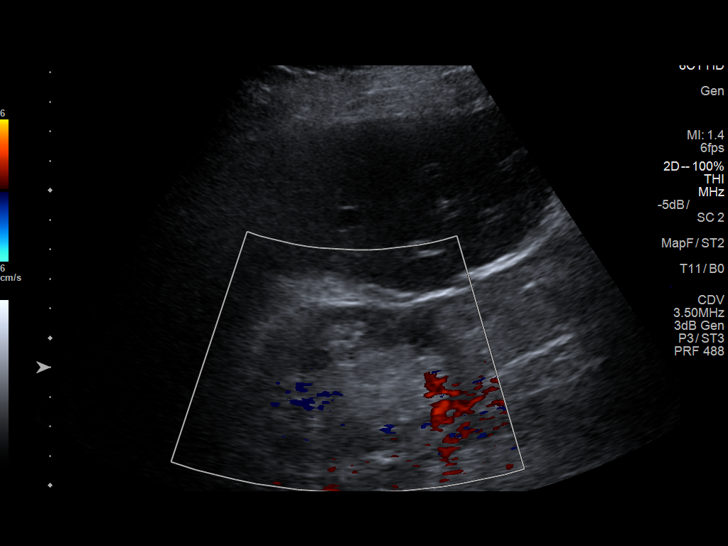
[im 10/28]
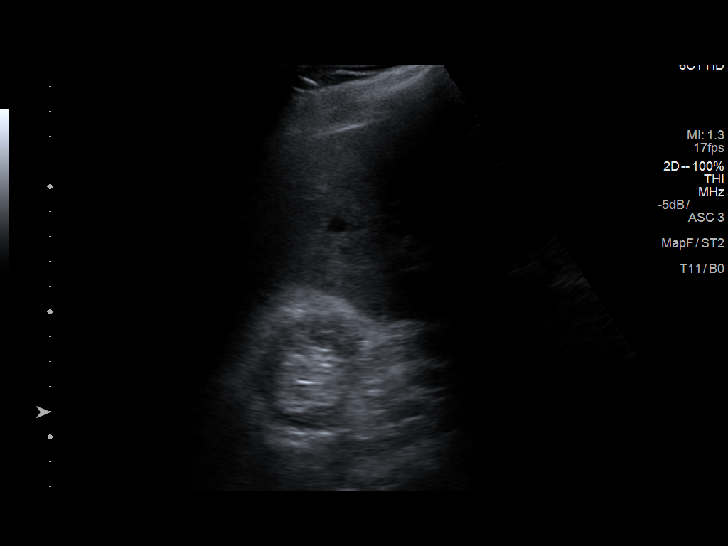
[im 11/28]
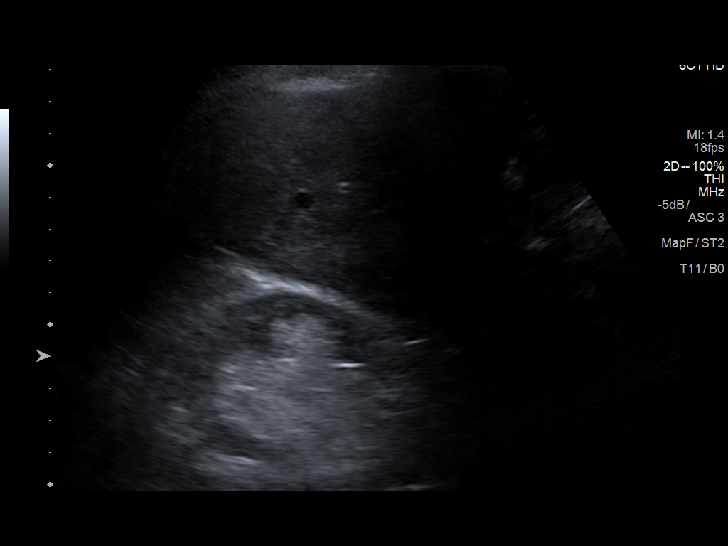
[im 13/28]
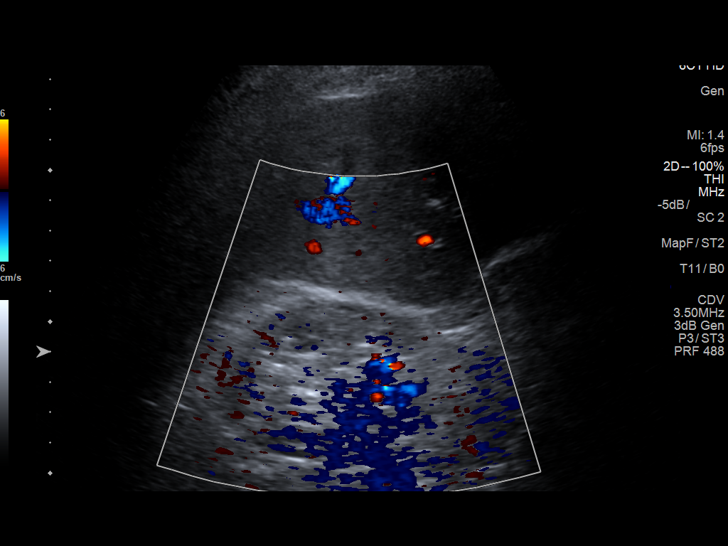
[im 15/28]
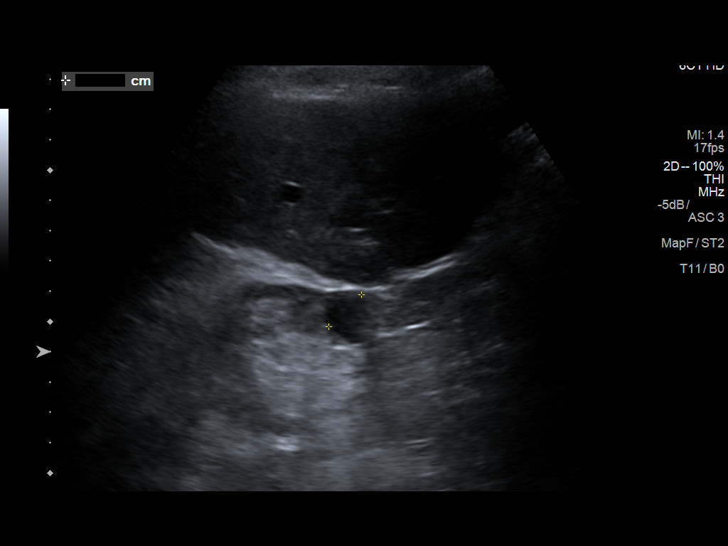
[im 17/28]
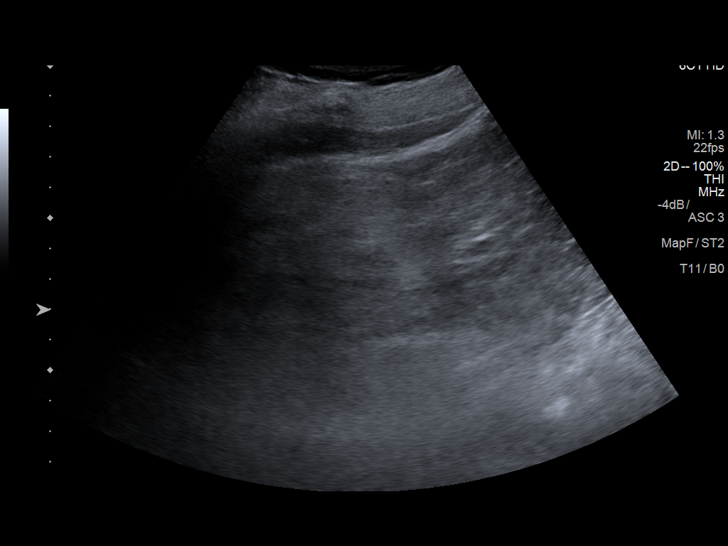
[im 19/28]
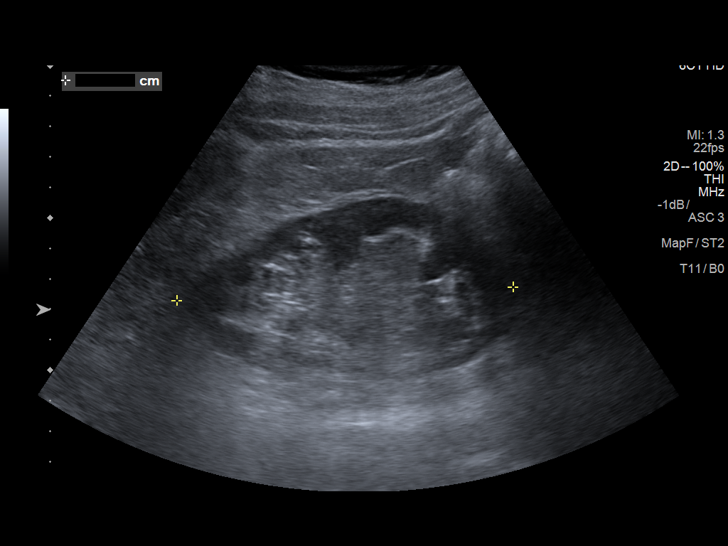
[im 21/28]
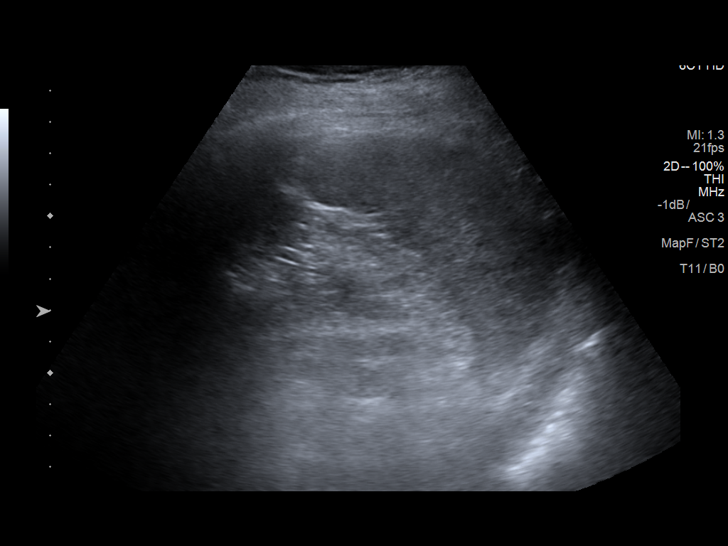
[im 23/28]
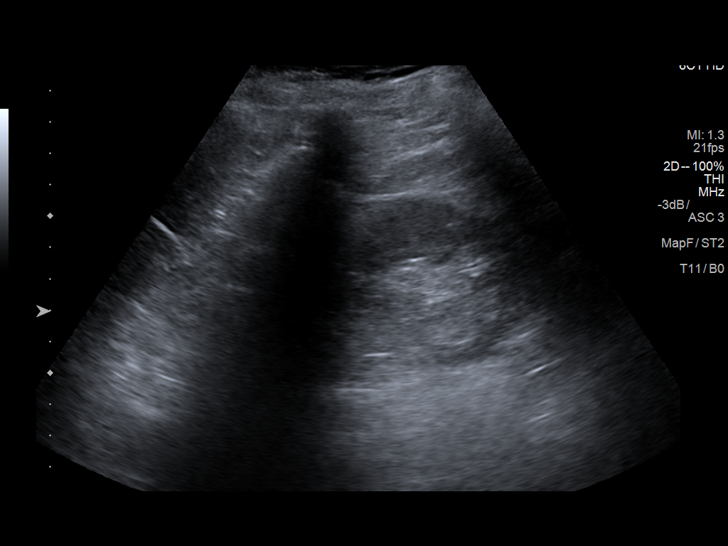
[im 25/28]
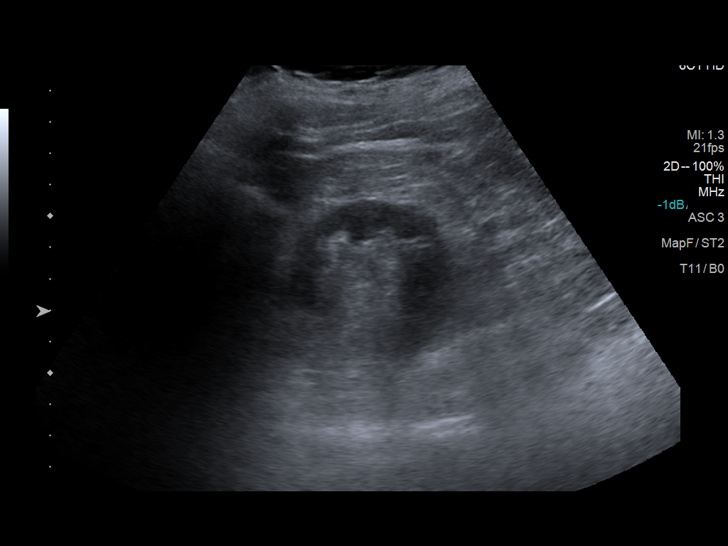
[im 28/28]
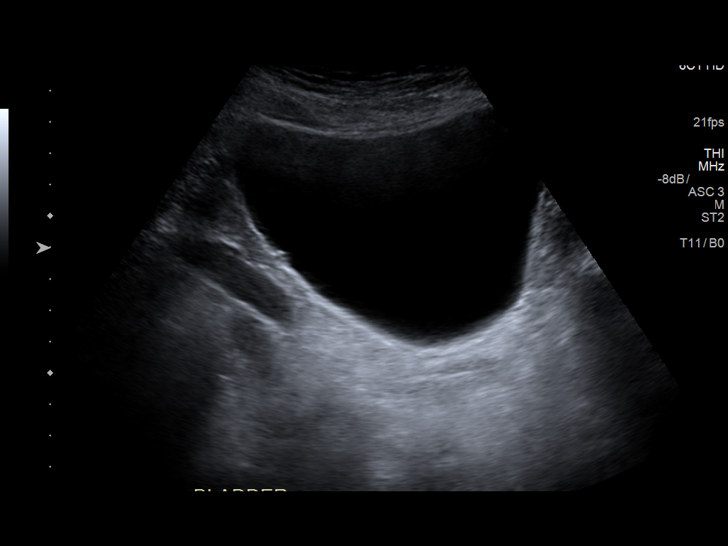

[14 of 25 positions shown; findings below may reference images not displayed]

FINDINGS: Right Kidney:

Length: 9.2 cm. Diffuse cortical thinning. Normal parenchymal
echogenicity. Hypoechoic mass arises from the lower pole measuring
18 x 13 x 15 mm. This is likely a cyst but is not well enough
defined to confidently diagnose a simple cyst. No other renal
masses, no stones and no hydronephrosis.

Left Kidney:

Length: 11.1 cm. Diffuse cortical thinning. Normal parenchymal
echogenicity. No masses or cysts. No stones or hydronephrosis.

Bladder:

Appears normal for degree of bladder distention.
IMPRESSION: 1. Bilateral renal cortical thinning.
2. No hydronephrosis.
3. 18 mm lower pole right renal cyst, not fully characterized on
this study. Recommend follow-up ultrasound in 4- 6 months to
reassess.

## 2017-02-27 NOTE — Telephone Encounter (Signed)
Mary from [facility] called to ask for Verbal permission to continued Physical therapy (1 times weekly) for 4 addt'l weeks-- pls call her at [phone]

## 2017-02-28 DIAGNOSIS — I69351 Hemiplegia and hemiparesis following cerebral infarction affecting right dominant side: Secondary | ICD-10-CM | POA: Diagnosis not present

## 2017-02-28 DIAGNOSIS — I5030 Unspecified diastolic (congestive) heart failure: Secondary | ICD-10-CM | POA: Diagnosis not present

## 2017-02-28 DIAGNOSIS — I130 Hypertensive heart and chronic kidney disease with heart failure and stage 1 through stage 4 chronic kidney disease, or unspecified chronic kidney disease: Secondary | ICD-10-CM | POA: Diagnosis not present

## 2017-02-28 DIAGNOSIS — N183 Chronic kidney disease, stage 3 (moderate): Secondary | ICD-10-CM | POA: Diagnosis not present

## 2017-02-28 DIAGNOSIS — I2510 Atherosclerotic heart disease of native coronary artery without angina pectoris: Secondary | ICD-10-CM | POA: Diagnosis not present

## 2017-02-28 DIAGNOSIS — G40909 Epilepsy, unspecified, not intractable, without status epilepticus: Secondary | ICD-10-CM | POA: Diagnosis not present

## 2017-03-01 ENCOUNTER — Telehealth: Payer: Self-pay | Admitting: Adult Health

## 2017-03-01 NOTE — Telephone Encounter (Signed)
LVM for [facility] authorizing extension of OT.  Charyl Bigger, CMA

## 2017-03-01 NOTE — Telephone Encounter (Signed)
Correction to [facility] @ [facility] [phone]

## 2017-03-01 NOTE — Telephone Encounter (Signed)
[name] called again from [facility] for Verbal auth for extended weekly Occ therapy--- pls call her at [phone]

## 2017-03-04 DIAGNOSIS — I2510 Atherosclerotic heart disease of native coronary artery without angina pectoris: Secondary | ICD-10-CM | POA: Diagnosis not present

## 2017-03-04 DIAGNOSIS — N183 Chronic kidney disease, stage 3 (moderate): Secondary | ICD-10-CM | POA: Diagnosis not present

## 2017-03-04 DIAGNOSIS — I130 Hypertensive heart and chronic kidney disease with heart failure and stage 1 through stage 4 chronic kidney disease, or unspecified chronic kidney disease: Secondary | ICD-10-CM | POA: Diagnosis not present

## 2017-03-04 DIAGNOSIS — I69351 Hemiplegia and hemiparesis following cerebral infarction affecting right dominant side: Secondary | ICD-10-CM | POA: Diagnosis not present

## 2017-03-04 DIAGNOSIS — I5030 Unspecified diastolic (congestive) heart failure: Secondary | ICD-10-CM | POA: Diagnosis not present

## 2017-03-04 DIAGNOSIS — G40909 Epilepsy, unspecified, not intractable, without status epilepticus: Secondary | ICD-10-CM | POA: Diagnosis not present

## 2017-03-06 NOTE — Telephone Encounter (Signed)
Kenney Houseman has called and left message. Please see her note. MPulliam, CMA/RT(R)

## 2017-03-07 ENCOUNTER — Encounter: Payer: Medicare Other | Attending: Physical Medicine & Rehabilitation

## 2017-03-07 ENCOUNTER — Ambulatory Visit (HOSPITAL_BASED_OUTPATIENT_CLINIC_OR_DEPARTMENT_OTHER): Payer: 59 | Admitting: Physical Medicine & Rehabilitation

## 2017-03-07 ENCOUNTER — Encounter: Payer: Self-pay | Admitting: Physical Medicine & Rehabilitation

## 2017-03-07 VITALS — BP 116/77 | HR 67

## 2017-03-07 DIAGNOSIS — I69998 Other sequelae following unspecified cerebrovascular disease: Secondary | ICD-10-CM | POA: Diagnosis not present

## 2017-03-07 DIAGNOSIS — I69393 Ataxia following cerebral infarction: Secondary | ICD-10-CM | POA: Diagnosis not present

## 2017-03-07 DIAGNOSIS — R269 Unspecified abnormalities of gait and mobility: Secondary | ICD-10-CM

## 2017-03-07 DIAGNOSIS — M1711 Unilateral primary osteoarthritis, right knee: Secondary | ICD-10-CM | POA: Diagnosis not present

## 2017-03-07 DIAGNOSIS — I69398 Other sequelae of cerebral infarction: Secondary | ICD-10-CM

## 2017-03-07 DIAGNOSIS — I69359 Hemiplegia and hemiparesis following cerebral infarction affecting unspecified side: Secondary | ICD-10-CM | POA: Diagnosis not present

## 2017-03-07 DIAGNOSIS — I69898 Other sequelae of other cerebrovascular disease: Secondary | ICD-10-CM | POA: Diagnosis present

## 2017-03-07 NOTE — Patient Instructions (Signed)
Herbert Marquez 20 intra-articular injection What is this medicine? Herbert Marquez 20 (HI lan G F 20) is used to treat osteoarthritis of the knee. It lubricates and cushions the joint, reducing pain in the knee. This medicine may be used for other purposes; ask your health care provider or pharmacist if you have questions. COMMON BRAND NAME(S): Synvisc, Synvisc-One What should I tell my health care provider before I take this medicine? They need to know if you have any of these conditions: -severe knee inflammation -skin conditions or sensitivity -skin or joint infection -venous stasis -an unusual or allergic reaction to Herbert Marquez 20, hyaluronan (sodium hyaluronate), eggs, other medicines, foods, dyes, or preservatives -pregnant or trying to get pregnant -breast-feeding How should I use this medicine? This medicine is for injection into the knee joint. It is given by a health care professional in a hospital or clinic setting. Talk to your pediatrician regarding the use of this medicine in children. This medicine is not approved for use in children. Overdosage: If you think you have taken too much of this medicine contact a poison control center or emergency room at once. NOTE: This medicine is only for you. Do not share this medicine with others. What if I miss a dose? Keep appointments for follow-up doses as directed. For Synvisc, you will need weekly injections for 3 doses. It is important not to miss your dose. If you will receive Synvisc-One, then only 1 injection will be needed. Call your doctor or health care professional if you are unable to keep an appointment. What may interact with this medicine? Do not take this medicine with any of the following medications: -other injections for the joint like steroids or anesthetics -certain skin disinfectants like benzalkonium chloride This list may not describe all possible interactions. Give your health care provider a list of all the medicines, herbs,  non-prescription drugs, or dietary supplements you use. Also tell them if you smoke, drink alcohol, or use illegal drugs. Some items may interact with your medicine. What should I watch for while using this medicine? Tell your doctor or healthcare professional if your symptoms do not start to get better or if they get worse. Your condition will be monitored carefully while you are receiving this medicine. Most persons get pain relief for up to 6 months after treatment. Avoid strenuous activities (high-impact sports, jogging) or major weight-bearing activities for 48 hours after the injection. What side effects may I notice from receiving this medicine? Side effects that you should report to your doctor or health care professional as soon as possible: -allergic reactions like skin rash, itching or hives, swelling of the face, lips, or tongue -difficulty breathing -fever or chills -severe joint pain or swelling -unusual bleeding or bruising Side effects that usually do not require medical attention (report to your doctor or health care professional if they continue or are bothersome): -dizziness -flushing -general ill feeling or flu-like symptoms -headache -minor joint pain or swelling -muscle pain or cramps -pain, redness, irritation or bruising at site of injection This list may not describe all possible side effects. Call your doctor for medical advice about side effects. You may report side effects to FDA at 1-800-FDA-1088. Where should I keep my medicine? This drug is given in a hospital or clinic and will not be stored at home. NOTE: This sheet is a summary. It may not cover all possible information. If you have questions about this medicine, talk to your doctor, pharmacist, or health care provider.    2018 Elsevier/Gold Standard (2015-03-24 11:48:41)

## 2017-03-07 NOTE — Progress Notes (Signed)
Subjective:    Patient ID: Herbert Marquez, male    DOB: [birth year], 84 y.o.   MRN: [id number]  HPI  Interval medical history positive for seizure episode, Keppra was increased by neurology.  Patient went to skilled nursing facility and is now back home with his wife receiving home health PT. He is using a walker to ambulate but notes that right knee bothers him during ambulation Right knee pain was not relieved by corticosteroid injection performed in November 2018, R knee medial compartment moderate, lateral compartment severe Was in OP therapy prior to pacemaker placement  Mod I dressing and bathing Uses weighted utensils   Pain Inventory Average Pain 4 Pain Right Now 0 My pain is na  In the last 24 hours, has pain interfered with the following? General activity 4 Relation with others 3 Enjoyment of life 5 What TIME of day is your pain at its worst? daytime Sleep (in general) Good  Pain is worse with: walking, bending, inactivity and standing Pain improves with: rest, therapy/exercise and medication Relief from Meds: .  Mobility walk with assistance use a walker ability to climb steps?  no do you drive?  no use a wheelchair transfers alone  Function retired  Neuro/Psych trouble walking  Prior Studies Any changes since last visit?  no  Physicians involved in your care Any changes since last visit?  no   Family History  Problem Relation Age of Onset  . Hypertension Mother   . Lung cancer Father   . Lung cancer Brother    Social History   Socioeconomic History  . Marital status: Married    Spouse name: Not on file  . Number of children: Not on file  . Years of education: Not on file  . Highest education level: Not on file  Social Needs  . Financial resource strain: Not on file  . Food insecurity - worry: Not on file  . Food insecurity - inability: Not on file  . Transportation needs - medical: Not on file  . Transportation needs - non-medical:  Not on file  Occupational History  . Occupation: Retired    Fish farm manager: OTHER  Tobacco Use  . Smoking status: Former Smoker    Last attempt to quit: 03/06/1971    Years since quitting: 46.0  . Smokeless tobacco: Never Used  Substance and Sexual Activity  . Alcohol use: Yes    Alcohol/week: 4.8 oz    Types: 7 Glasses of wine, 1 Cans of beer per week    Comment: 1/2 BEER AND 1 WINE  . Drug use: No  . Sexual activity: No  Other Topics Concern  . Not on file  Social History Narrative   Lives in [place], [place] with wife. Has 3 children.    Past Surgical History:  Procedure Laterality Date  . CARDIOVASCULAR STRESS TEST  07/01/2007   EF 74%  . CATARACT EXTRACTION, BILATERAL    . CORONARY ANGIOPLASTY WITH STENT PLACEMENT  10/2004   stenting x 2 to RCA  . FEMUR IM NAIL Right 11/26/2015   Procedure: INTRAMEDULLARY RIGHT (IM) NAIL FEMORAL;  Surgeon: Rod Can, MD;  Location: [facility];  Service: Orthopedics;  Laterality: Right;  . FRACTURE SURGERY    . HERNIA REPAIR    . HIP ARTHROPLASTY  03/09/2011   Procedure: ARTHROPLASTY BIPOLAR HIP;  Surgeon: Mauri Pole;  Location: [facility];  Service: Orthopedics;  Laterality: Left;  . INSERT / REPLACE / REMOVE PACEMAKER  10/23/2016  . LAPAROSCOPIC  INCISIONAL / UMBILICAL / VENTRAL HERNIA REPAIR     "below his naval"  . PACEMAKER IMPLANT N/A 10/23/2016   Procedure: Pacemaker Implant;  Surgeon: Thompson Grayer, MD;  Location: [facility];  Service: Cardiovascular;  Laterality: N/A;   Past Medical History:  Diagnosis Date  . Arthritis    "pretty much all over"   . Basal cell carcinoma    "several burned off his body, face, head"  . BPH (benign prostatic hypertrophy)   . Coronary artery disease    a. s/p PCI of RCA in 2006  . CVA (cerebral infarction)    a. 06/2015: left thalamic and bilateral PCA  . GERD (gastroesophageal reflux disease)   . Hyperlipidemia   . Hypertension   . Presence of permanent cardiac pacemaker   . Stroke ([place])    . TIA (transient ischemic attack)    Approximately 6 weeks post-cardiac catheterization.    There were no vitals taken for this visit.  Opioid Risk Score:   Fall Risk Score:  `1  Depression screen PHQ 2/9  Depression screen [facility] 2/9 01/29/2017 01/07/2017 06/29/2016 03/20/2016 08/18/2015 07/21/2015  Decreased Interest 2 0 1 1 0 0  Down, Depressed, Hopeless 2 0 1 1 0 1  PHQ - 2 Score 4 0 2 2 0 1  Altered sleeping 0 - 0 3 - 0  Tired, decreased energy 2 - 2 0 - 2  Change in appetite 1 - 0 0 - 0  Feeling bad or failure about yourself  2 - 1 1 - 1  Trouble concentrating 2 - 2 0 - 0  Moving slowly or fidgety/restless 1 - 0 0 - 1  Suicidal thoughts 1 - 0 0 - 0  PHQ-9 Score 13 - 7 6 - 5  Difficult doing work/chores Somewhat difficult - Somewhat difficult Somewhat difficult - Somewhat difficult     Review of Systems  Constitutional: Negative.   HENT: Negative.   Eyes: Negative.   Respiratory: Negative.   Cardiovascular: Negative.   Gastrointestinal: Negative.   Endocrine: Negative.   Genitourinary: Negative.   Musculoskeletal: Positive for gait problem and joint swelling.  Skin: Negative.   Allergic/Immunologic: Negative.   Hematological: Negative.   Psychiatric/Behavioral: Negative.   All other systems reviewed and are negative.      Objective:   Physical Exam  Constitutional: He is oriented to person, place, and time. He appears well-developed and well-nourished.  HENT:  Head: Normocephalic and atraumatic.  Eyes: Conjunctivae and EOM are normal. Pupils are equal, round, and reactive to light.  Neurological: He is alert and oriented to person, place, and time.  Psychiatric: He has a normal mood and affect.  Nursing note and vitals reviewed.  Patient is forgetful he does not remember instructions with immediate recall or more remote memory such as medical history.  Right knee has no evidence of effusion.  There is lateral greater than medial joint line tenderness no erythema.   He has mild valgus deformity.  He has full extension flexion is limited to about 110 degrees. Motor strength is 4/5 in the right deltoid bicep tricep grip, right hip flexor is 3- knee extensors for ankle dorsiflexors 4 There is dysmetria on right finger-nose-finger testing, heel shin is limited by weakness. Sensation intact light touch in both upper limbs.      Assessment & Plan:  1.  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  He has a post stroke seizure disorder.  His functional status has improved since  his skilled nursing stay.  He is back to his baseline in terms of his ADLs.  His ambulation distance is still less than it was.  This is partially inhibited by his right knee pain 2.  Right knee osteoarthritis lateral compartment greater than medial compartment, no significant response to corticosteroid, will trial Synvisc return in 3 weeks for this We discussed that after he finishes home health may benefit from outpatient PT only.  Will call to schedule

## 2017-03-08 ENCOUNTER — Other Ambulatory Visit: Payer: Self-pay | Admitting: Adult Health

## 2017-03-12 ENCOUNTER — Encounter: Payer: Self-pay | Admitting: Cardiovascular Disease

## 2017-03-12 ENCOUNTER — Ambulatory Visit (INDEPENDENT_AMBULATORY_CARE_PROVIDER_SITE_OTHER): Payer: Medicare Other | Admitting: Cardiovascular Disease

## 2017-03-12 ENCOUNTER — Ambulatory Visit (INDEPENDENT_AMBULATORY_CARE_PROVIDER_SITE_OTHER): Payer: Medicare Other | Admitting: *Deleted

## 2017-03-12 VITALS — BP 132/76 | HR 99 | Ht 67.0 in | Wt 160.0 lb

## 2017-03-12 DIAGNOSIS — I2510 Atherosclerotic heart disease of native coronary artery without angina pectoris: Secondary | ICD-10-CM

## 2017-03-12 DIAGNOSIS — I130 Hypertensive heart and chronic kidney disease with heart failure and stage 1 through stage 4 chronic kidney disease, or unspecified chronic kidney disease: Secondary | ICD-10-CM | POA: Diagnosis not present

## 2017-03-12 DIAGNOSIS — I480 Paroxysmal atrial fibrillation: Secondary | ICD-10-CM

## 2017-03-12 DIAGNOSIS — I5030 Unspecified diastolic (congestive) heart failure: Secondary | ICD-10-CM | POA: Diagnosis not present

## 2017-03-12 DIAGNOSIS — I441 Atrioventricular block, second degree: Secondary | ICD-10-CM | POA: Diagnosis not present

## 2017-03-12 DIAGNOSIS — I69351 Hemiplegia and hemiparesis following cerebral infarction affecting right dominant side: Secondary | ICD-10-CM | POA: Diagnosis not present

## 2017-03-12 DIAGNOSIS — N183 Chronic kidney disease, stage 3 (moderate): Secondary | ICD-10-CM | POA: Diagnosis not present

## 2017-03-12 DIAGNOSIS — G40909 Epilepsy, unspecified, not intractable, without status epilepticus: Secondary | ICD-10-CM | POA: Diagnosis not present

## 2017-03-12 NOTE — Patient Instructions (Signed)
Medication Instructions:  Your physician recommends that you continue on your current medications as directed. Please refer to the Current Medication list given to you today.   Labwork: None Ordered   Testing/Procedures: None Ordered   Follow-Up: Your physician wants you to follow-up in: 6 months with Dr. Nahser.  You will receive a reminder letter in the mail two months in advance. If you don't receive a letter, please call our office to schedule the follow-up appointment.   If you need a refill on your cardiac medications before your next appointment, please call your pharmacy.   Thank you for choosing [facility]! Kenan Moodie, RN [phone]    

## 2017-03-12 NOTE — Progress Notes (Signed)
Remote pacemaker transmission.   

## 2017-03-12 NOTE — Progress Notes (Signed)
Cardiology Office Note   Date:  03/12/2017   ID:  Herbert Marquez, DOB 1927/04/28, MRN [id number]  PCP:  [name], NP  Cardiologist:   [name], MD   Chief Complaint  Patient presents with  . Coronary Artery Disease   Problem list:  1. Coronary artery disease-status post PTCA and stenting of the RCA in [place], [place]  2. Dyslipidemia  3. Hypertension  4. Diabetes mellitus  5. Hernia repair   Prior Notes:   Pt has done well from a cardiac standpoint. He fell and broke his left hip in January . His is recovering well. He is still walking with a cane. He had no complications during or after surgery.  He had a severe UTI in June / July of 2013. He has been seen by Urology and was started on Tamulosin for BPH.  He's not had any episodes of chest pain or shortness breath. He still walks with a cane but is able to get out and stays fairly active.  October 02, 2012:  Mr. Marquez is doing well. No CP , no dyspnea. He remains active - went to the beach last week. He walks at [facility] when he is down there. Goes to the [facility] 3 times a week. No angina.  Jan. 30, 2015:  Herbert Marquez is doing well. He has not had any cardiac problems. He has been to the beach recently.  He goes to the [facility] 2-3 times a week. He doesn't have any angina. His primary medical doctor is managing his lipids.    Feb. 18, 2016:   Herbert Marquez is a 84 y.o. male who presents for follow up of his CAD. NO CP or dyspnea.    Limited by leg and back pains.  He and his wife celebrated their 59th wedding anniversary.   They went to [facility] last week.    October 25, 2014:  Doing well from a cardiac standpoint..  Has a circk in his neck Staying buys,   Still has his place at [facility].   May 10, 2015: Doing great. Some DOE  Still going down to [facility]  No CP, Wants refill on NTG - lost his bottle  May 17, 2016:   Has had a rough year since I last saw him  Has had 2 strokes since I last  saw him  Has severe weakness of his right side now ( 1st stroke)  The 2nd stroke affected his memory - has improved.  Was found to have PAF during his hospitalization in Feb.  Was on plavix,  Has been changed to [facility]   August 20, 2016:  Herbert Marquez is seen today  Had another small stroke since Iast saw him in March  No cardiac issues  March 12, 2017:  Herbert Marquez is seen today for follow-up visit. He is examined in the wheelchair. Had a pacer placed in Aug , 2018. Had a seizure in Nov. 2018.   Increased his Keppra  Feels better.    Past Medical History:  Diagnosis Date  . Arthritis    "pretty much all over"   . Basal cell carcinoma    "several burned off his body, face, head"  . BPH (benign prostatic hypertrophy)   . Coronary artery disease    a. s/p PCI of RCA in 2006  . CVA (cerebral infarction)    a. 06/2015: left thalamic and bilateral PCA  . GERD (gastroesophageal reflux disease)   .  Hyperlipidemia   . Hypertension   . Presence of permanent cardiac pacemaker   . Stroke ([place])   . TIA (transient ischemic attack)    Approximately 6 weeks post-cardiac catheterization.     Past Surgical History:  Procedure Laterality Date  . CARDIOVASCULAR STRESS TEST  07/01/2007   EF 74%  . CATARACT EXTRACTION, BILATERAL    . CORONARY ANGIOPLASTY WITH STENT PLACEMENT  10/2004   stenting x 2 to RCA  . FEMUR IM NAIL Right 11/26/2015   Procedure: INTRAMEDULLARY RIGHT (IM) NAIL FEMORAL;  Surgeon: Rod Can, MD;  [facility]: [facility];  Service: Orthopedics;  Laterality: Right;  . FRACTURE SURGERY    . HERNIA REPAIR    . HIP ARTHROPLASTY  03/09/2011   Procedure: ARTHROPLASTY BIPOLAR HIP;  Surgeon: Mauri Pole;  [facility];  Service: Orthopedics;  Laterality: Left;  . INSERT / REPLACE / REMOVE PACEMAKER  10/23/2016  . LAPAROSCOPIC INCISIONAL / UMBILICAL / VENTRAL HERNIA REPAIR     "below his naval"  . PACEMAKER IMPLANT N/A 10/23/2016   Procedure: Pacemaker Implant;  Surgeon:  Thompson Grayer, MD;  Location: [facility];  Service: Cardiovascular;  Laterality: N/A;     Current Outpatient Medications  Medication Sig Dispense Refill  . acetaminophen (TYLENOL) 325 MG tablet Take 2 tablets (650 mg total) every 4 (four) hours as needed by mouth for mild pain (or temp > 37.5 C (99.5 F)). 30 tablet 0  . apixaban (ELIQUIS) 5 MG TABS tablet Take 1 tablet (5 mg total) by mouth 2 (two) times daily. Resume 10/25/16 evening dose 60 tablet 1  . aspirin EC 81 MG tablet Take 1 tablet (81 mg total) by mouth daily.    [name] atorvastatin (LIPITOR) 80 MG tablet TAKE 1 TABLET BY MOUTH DAILY. 90 tablet 0  . levETIRAcetam (KEPPRA) 750 MG tablet Take 1 tablet (750 mg total) 2 (two) times daily by mouth. 60 tablet 0  . Multiple Vitamin (MULTIVITAMIN WITH MINERALS) TABS tablet Take 1 tablet by mouth daily.    . nitroGLYCERIN (NITROSTAT) 0.4 MG SL tablet Place 1 tablet (0.4 mg total) under the tongue every 5 (five) minutes as needed for chest pain. 25 tablet 5  . polyvinyl alcohol (ARTIFICIAL TEARS) 1.4 % ophthalmic solution Place 1 drop into both eyes daily as needed for dry eyes.    [name] QUEtiapine (SEROQUEL) 25 MG tablet TAKE 1 TABLET BY MOUTH AT BEDTIME. 30 tablet 2   No current facility-administered medications for this visit.     Allergies:   Patient has no known allergies.    Social History:  The patient  reports that he quit smoking about 46 years ago. he has never used smokeless tobacco. He reports that he drinks about 4.8 oz of alcohol per week. He reports that he does not use drugs.   Family History:  The patient's family history includes Hypertension in his mother; Lung cancer in his brother and father.    ROS: Noted in current history.  All other systems are negative.   Physical Exam: Blood pressure 132/76, pulse 99, height 5\' 7"  (1.702 m), weight 160 lb (72.6 kg), SpO2 98 %.  GEN:  Elderly , frail gentleman.  Examined in the wheelchair  HEENT: Normal NECK: No JVD; No  carotid bruits LYMPHATICS: No lymphadenopathy CARDIAC: RR ,  RESPIRATORY:  Clear to auscultation without rales, wheezing or rhonchi  ABDOMEN: Soft, non-tender, non-distended MUSCULOSKELETAL:  No edema; No deformity  SKIN: Warm and dry NEUROLOGIC:  Loss of  fine motor skills on right side    EKG:     Recent Labs: 06/04/2016: TSH 3.437 01/07/2017: ALT 23 01/09/2017: BUN 17; Creatinine, Ser 0.98; Hemoglobin 12.5; Platelets 366; Potassium 4.2; Sodium 138 01/10/2017: Magnesium 1.9    Lipid Panel    Component Value Date/Time   CHOL 115 01/07/2017 1332   TRIG 77 01/07/2017 1332   HDL 38 (L) 01/07/2017 1332   CHOLHDL 3.0 01/07/2017 1332   VLDL 15 01/07/2017 1332   LDLCALC 62 01/07/2017 1332      Wt Readings from Last 3 Encounters:  03/12/17 160 lb (72.6 kg)  01/07/17 163 lb 12.8 oz (74.3 kg)  12/03/16 166 lb (75.3 kg)      Other studies Reviewed: Additional studies/ records that were reviewed today include: . Review of the above records demonstrates:    ASSESSMENT AND PLAN:  1. Coronary artery disease-status post PTCA and stenting of the RCA in [place], [place]  - he is doing well.  No angina.  . Doing well  2. Dyslipidemia -   . Stable    3. Hypertension  -     BP is well controlled.   4. Diabetes mellitus    5. Hernia repair   6. Paroxysmal atrial fib:     -on eliquis   7.  CVAs:    Can walk with walker at home .     Current medicines are reviewed at length with the patient today.  The patient does not have concerns regarding medicines.  The following changes have been made:  no change   Disposition:   FU with me in 6 months     Signed, [name], MD  03/12/2017 11:32 AM    [facility] [address], [place], [place]  [address] Phone: [phone]; Fax: [phone]

## 2017-03-13 ENCOUNTER — Encounter: Payer: Self-pay | Admitting: Cardiology

## 2017-03-18 LAB — CUP PACEART REMOTE DEVICE CHECK
Battery Remaining Longevity: 131 mo
Battery Remaining Percentage: 95.5 %
Battery Voltage: 3.02 V
Brady Statistic AP VP Percent: 3.2 %
Brady Statistic AP VS Percent: 13 %
Brady Statistic AS VP Percent: 1 %
Brady Statistic AS VS Percent: 83 %
Brady Statistic RA Percent Paced: 15 %
Brady Statistic RV Percent Paced: 3.6 %
Date Time Interrogation Session: 20190101070823
Implantable Lead Implant Date: 20180821
Implantable Lead Implant Date: 20180821
Implantable Lead Location: 753859
Implantable Lead Location: 753860
Implantable Pulse Generator Implant Date: 20180821
Lead Channel Impedance Value: 460 Ohm
Lead Channel Impedance Value: 600 Ohm
Lead Channel Pacing Threshold Amplitude: 0.5 V
Lead Channel Pacing Threshold Amplitude: 0.625 V
Lead Channel Pacing Threshold Pulse Width: 0.5 ms
Lead Channel Pacing Threshold Pulse Width: 0.5 ms
Lead Channel Sensing Intrinsic Amplitude: 12 mV
Lead Channel Sensing Intrinsic Amplitude: 4.7 mV
Lead Channel Setting Pacing Amplitude: 0.75 V
Lead Channel Setting Pacing Amplitude: 1.625
Lead Channel Setting Pacing Pulse Width: 0.5 ms
Lead Channel Setting Sensing Sensitivity: 2 mV
Pulse Gen Model: 2272
Pulse Gen Serial Number: 8933379

## 2017-03-21 DIAGNOSIS — I5030 Unspecified diastolic (congestive) heart failure: Secondary | ICD-10-CM | POA: Diagnosis not present

## 2017-03-21 DIAGNOSIS — I130 Hypertensive heart and chronic kidney disease with heart failure and stage 1 through stage 4 chronic kidney disease, or unspecified chronic kidney disease: Secondary | ICD-10-CM | POA: Diagnosis not present

## 2017-03-21 DIAGNOSIS — I2510 Atherosclerotic heart disease of native coronary artery without angina pectoris: Secondary | ICD-10-CM | POA: Diagnosis not present

## 2017-03-21 DIAGNOSIS — I69351 Hemiplegia and hemiparesis following cerebral infarction affecting right dominant side: Secondary | ICD-10-CM | POA: Diagnosis not present

## 2017-03-21 DIAGNOSIS — N183 Chronic kidney disease, stage 3 (moderate): Secondary | ICD-10-CM | POA: Diagnosis not present

## 2017-03-21 DIAGNOSIS — G40909 Epilepsy, unspecified, not intractable, without status epilepticus: Secondary | ICD-10-CM | POA: Diagnosis not present

## 2017-03-25 ENCOUNTER — Encounter: Payer: Medicare Other | Admitting: Internal Medicine

## 2017-03-27 NOTE — Progress Notes (Signed)
Subjective:    Patient ID: Herbert Marquez, male    DOB: Dec 12, 1927, 84 y.o.   MRN: [id number]  HPI:  Herbert Marquez is here for CPE.  He reports medication compliance and denies SE. Pacemaker placed August 2018- he missed appt 03/25/17 for check, he will reschedule        He was seen by Phys Med 03/07/17- instructed to return at end of month for Synvisc 1                injection Right knee, scheduled for 03/29/17 He has not had any seizure activity since Keppra was increased in Nov 2018, currently taking 750mg  BID He denies acute cardiac sx's PHQ reviewed- denies thoughts of harming himself/others Fasting labs will be drawn next week, he ate waffles this am Healthcare Maintenance: Fall Prevention- denies recent falls and continues to work with home PT- he feels stable, uses walker and wheelchair Patient Care Team    Relationship Specialty Notifications Start End  [place], Valetta Fuller D, NP PCP - General Family Medicine  03/20/16   Nahser, Wonda Cheng, MD Consulting Physician Cardiology  03/20/16   Rod Can, MD Consulting Physician Orthopedic Surgery  03/20/16   Jarome Matin, MD Consulting Physician Dermatology  03/20/16   Nickie Retort, MD Consulting Physician Urology  03/20/16   Shon Hough, MD Consulting Physician Ophthalmology  03/20/16     Patient Active Problem List   Diagnosis Date Noted  . TIA (transient ischemic attack)   . Presence of permanent cardiac pacemaker   . Hypertension   . Hyperlipidemia   . Coronary artery disease   . Basal cell carcinoma   . Arthritis   . Pressure injury of skin 01/08/2017  . Stroke ([place]) 01/07/2017  . Ischemic stroke ([place])   . Second degree Mobitz II AV block 10/23/2016  . Seizures ([place]) 10/09/2016  . Healthcare maintenance 10/01/2016  . Restlessness and agitation 07/02/2016  . Vascular dementia with behavior disturbance 06/29/2016  . Hemiparesis and other late effects of cerebrovascular accident ([place]) 06/29/2016  . Embolic stroke ([place])  27/08/2374  . Right hemiparesis ([place])   . History of CVA with residual deficit   . Chronic anticoagulation   . Atrial fibrillation with rapid ventricular response ([place])   . Seizure prophylaxis   . Diastolic dysfunction   . Bacteremia due to Gram-positive bacteria   . Altered mental status   . Dementia without behavioral disturbance   . Leukocytosis   . Acute blood loss anemia   . Acute encephalopathy 06/04/2016  . PAF (paroxysmal atrial fibrillation) ([place])   . Iron deficiency anemia 04/04/2016  . History of CVA (cerebrovascular accident) 04/04/2016  . Cerebral hemorrhage (HCC) w/ SDH s/p IV tPA   . Coronary artery disease involving native coronary artery of native heart without angina pectoris   . Orthostatic hypotension   . History of right hip replacement   . Acute ischemic stroke ([place]) - L temporal lobe s/p tPA 03/30/2016  . BPH (benign prostatic hyperplasia) 11/25/2015  . GERD (gastroesophageal reflux disease) 11/25/2015  . CKD (chronic kidney disease), stage II 11/25/2015  . Overactive bladder   . Cerebrovascular accident (CVA) ([place]) 09/18/2015  . Gait disturbance, post-stroke 06/23/2015  . 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 Thalamic infarction ([place]) 06/21/2015  . Benign essential HTN   . Type 2 diabetes mellitus with complication, without long-term current use of insulin ([place])   . Dysphagia as late effect of cerebrovascular disease   . Hyponatremia   .  HLD (hyperlipidemia)   . COPD (chronic obstructive pulmonary disease) ([place]) 03/09/2011     Past Medical History:  Diagnosis Date  . Arthritis    "pretty much all over"   . Basal cell carcinoma    "several burned off his body, face, head"  . BPH (benign prostatic hypertrophy)   . Coronary artery disease    a. s/p PCI of RCA in 2006  . CVA (cerebral infarction)    a. 06/2015: left thalamic and bilateral PCA  . GERD (gastroesophageal reflux disease)   . Hyperlipidemia   . Hypertension   . Presence of  permanent cardiac pacemaker   . Stroke ([place])   . TIA (transient ischemic attack)    Approximately 6 weeks post-cardiac catheterization.      Past Surgical History:  Procedure Laterality Date  . CARDIOVASCULAR STRESS TEST  07/01/2007   EF 74%  . CATARACT EXTRACTION, BILATERAL    . CORONARY ANGIOPLASTY WITH STENT PLACEMENT  10/2004   stenting x 2 to RCA  . FEMUR IM NAIL Right 11/26/2015   Procedure: INTRAMEDULLARY RIGHT (IM) NAIL FEMORAL;  Surgeon: Rod Can, MD;  Location: [facility];  Service: Orthopedics;  Laterality: Right;  . FRACTURE SURGERY    . HERNIA REPAIR    . HIP ARTHROPLASTY  03/09/2011   Procedure: ARTHROPLASTY BIPOLAR HIP;  Surgeon: Mauri Pole;  Location: [facility];  Service: Orthopedics;  Laterality: Left;  . INSERT / REPLACE / REMOVE PACEMAKER  10/23/2016  . LAPAROSCOPIC INCISIONAL / UMBILICAL / VENTRAL HERNIA REPAIR     "below his naval"  . PACEMAKER IMPLANT N/A 10/23/2016   Procedure: Pacemaker Implant;  Surgeon: Thompson Grayer, MD;  Location: [place] [facility];  Service: Cardiovascular;  Laterality: N/A;     Family History  Problem Relation Age of Onset  . Hypertension Mother   . Lung cancer Father   . Lung cancer Brother      Social History   Substance and Sexual Activity  Drug Use No     Social History   Substance and Sexual Activity  Alcohol Use Yes  . Alcohol/week: 4.8 oz  . Types: 7 Glasses of wine, 1 Cans of beer per week   Comment: 1/2 BEER AND 1 WINE     Social History   Tobacco Use  Smoking Status Former Smoker  . Last attempt to quit: 03/06/1971  . Years since quitting: 46.0  Smokeless Tobacco Never Used     Outpatient Encounter Medications as of 03/28/2017  Medication Sig  . acetaminophen (TYLENOL) 325 MG tablet Take 2 tablets (650 mg total) every 4 (four) hours as needed by mouth for mild pain (or temp > 37.5 C (99.5 F)).  [name] apixaban (ELIQUIS) 5 MG TABS tablet Take 1 tablet (5 mg total) by mouth 2 (two) times daily. Resume  10/25/16 evening dose  . aspirin EC 81 MG tablet Take 1 tablet (81 mg total) by mouth daily.  [name] atorvastatin (LIPITOR) 80 MG tablet TAKE 1 TABLET BY MOUTH DAILY.  [name] levETIRAcetam (KEPPRA) 750 MG tablet Take 1 tablet (750 mg total) 2 (two) times daily by mouth.  . Multiple Vitamin (MULTIVITAMIN WITH MINERALS) TABS tablet Take 1 tablet by mouth daily.  . nitroGLYCERIN (NITROSTAT) 0.4 MG SL tablet Place 1 tablet (0.4 mg total) under the tongue every 5 (five) minutes as needed for chest pain.  . polyvinyl alcohol (ARTIFICIAL TEARS) 1.4 % ophthalmic solution Place 1 drop into both eyes daily as needed for dry eyes.  [name]  QUEtiapine (SEROQUEL) 25 MG tablet TAKE 1 TABLET BY MOUTH AT BEDTIME.   No facility-administered encounter medications on file as of 03/28/2017.     Allergies: Patient has no known allergies.  Body mass index is 26.15 kg/m.  Blood pressure 109/71, pulse 75, height 5\' 6"  (1.676 m), weight 162 lb (73.5 kg), SpO2 98 %.    Review of Systems  Constitutional: Positive for fatigue. Negative for activity change, appetite change, chills, diaphoresis, fever and unexpected weight change.  HENT: Positive for hearing loss. Negative for congestion.        Bil hearing aids, however hearing continues to worsen-advised to return to Audiologist   Eyes: Negative for visual disturbance.  Respiratory: Negative for cough, chest tightness, shortness of breath, wheezing and stridor.   Cardiovascular: Negative for chest pain, palpitations and leg swelling.  Gastrointestinal: Negative for abdominal distention, abdominal pain, blood in stool, constipation, diarrhea, nausea and vomiting.  Endocrine: Negative for cold intolerance, heat intolerance, polydipsia, polyphagia and polyuria.  Genitourinary: Negative for difficulty urinating, flank pain and hematuria.  Musculoskeletal: Positive for arthralgias, back pain, gait problem, joint swelling, myalgias, neck pain and neck stiffness.  Skin: Negative for  color change, pallor, rash and wound.  Neurological: Negative for dizziness, seizures and headaches.  Hematological: Does not bruise/bleed easily.  Psychiatric/Behavioral: Negative for dysphoric mood, hallucinations, self-injury, sleep disturbance and suicidal ideas. The patient is not nervous/anxious and is not hyperactive.        Objective:   Physical Exam  Constitutional: He is oriented to person, place, and time. He appears well-developed and well-nourished. No distress.  HENT:  Head: Normocephalic and atraumatic.  Right Ear: External ear normal.  Left Ear: External ear normal.  Bil hearing aids   Eyes: Conjunctivae are normal. Pupils are equal, round, and reactive to light.  Neck: Normal range of motion. Neck supple.  Cardiovascular: Normal rate, regular rhythm, normal heart sounds and intact distal pulses.  Pulmonary/Chest: Effort normal and breath sounds normal. No respiratory distress. He has no wheezes. He has no rales. He exhibits no tenderness.  Abdominal: Soft. Bowel sounds are normal. He exhibits no distension and no mass. There is no tenderness. There is no rebound and no guarding.  Musculoskeletal: He exhibits edema and tenderness.       Right knee: He exhibits decreased range of motion and swelling. Tenderness found.  Neurological: He is alert and oriented to person, place, and time. He displays no seizure activity.  RUE/RLE 4/5 strength LUE/LLE 5/5 strength  Skin: Skin is warm and dry. No rash noted. He is not diaphoretic. No erythema. No pallor.  Psychiatric: He has a normal mood and affect. His behavior is normal. Judgment and thought content normal.  Nursing note and vitals reviewed.         Assessment & Plan:   1. Healthcare maintenance   2. Hyperlipidemia, unspecified hyperlipidemia type   3. Benign essential HTN   4. Seizures ([place])     Healthcare maintenance Overall you are doing GREAT! Please continue all medications as directed. Increase water  intake and follow heart healthy diet. We will fax over insurance forms to [facility], re Eliquis insurance coverage Please return next week in am for fasting labs(make lab appt) and obtain insurance paperwork. Good luck with injection tomorrow and continue follow-up with Cardiology and Neurology as directed. Follow-up here in 6 months, sooner if needed.  Hyperlipidemia Ref Range & Units 25mo ago   Cholesterol 0 - 200 mg/dL 115   Triglycerides <  150 mg/dL 77   HDL >40 mg/dL 38 Abnormally low    Total CHOL/HDL Ratio RATIO 3.0   VLDL 0 - 40 mg/dL 15   LDL Cholesterol 0 - 99 mg/dL 62    Currently taking Atorvastatin 80mg  daily, been on this dose since 04/2016 (increased from 40mg  after 2/18 hospitalization) Will re-check lipids next week. He has been increasing regular exercise and following heart healthy diet.  Benign essential HTN  BP at 109/71, HR 74   Seizures (HCC) Currently taking Keppra 750mg  BID, denies any recent seizure activity Has f/u with Neurology 04/23/17    FOLLOW-UP:  Return in about 6 months (around 09/25/2017) for Regular Follow Up.

## 2017-03-28 ENCOUNTER — Encounter: Payer: Self-pay | Admitting: Adult Health

## 2017-03-28 ENCOUNTER — Other Ambulatory Visit: Payer: Self-pay

## 2017-03-28 ENCOUNTER — Ambulatory Visit (INDEPENDENT_AMBULATORY_CARE_PROVIDER_SITE_OTHER): Payer: Medicare Other | Admitting: Adult Health

## 2017-03-28 VITALS — BP 109/71 | HR 75 | Ht 66.0 in | Wt 162.0 lb

## 2017-03-28 DIAGNOSIS — I10 Essential (primary) hypertension: Secondary | ICD-10-CM

## 2017-03-28 DIAGNOSIS — I2510 Atherosclerotic heart disease of native coronary artery without angina pectoris: Secondary | ICD-10-CM

## 2017-03-28 DIAGNOSIS — E782 Mixed hyperlipidemia: Secondary | ICD-10-CM

## 2017-03-28 DIAGNOSIS — R569 Unspecified convulsions: Secondary | ICD-10-CM

## 2017-03-28 DIAGNOSIS — K219 Gastro-esophageal reflux disease without esophagitis: Secondary | ICD-10-CM

## 2017-03-28 DIAGNOSIS — E785 Hyperlipidemia, unspecified: Secondary | ICD-10-CM

## 2017-03-28 DIAGNOSIS — R5383 Other fatigue: Secondary | ICD-10-CM

## 2017-03-28 DIAGNOSIS — N182 Chronic kidney disease, stage 2 (mild): Secondary | ICD-10-CM

## 2017-03-28 DIAGNOSIS — Z0000 Encounter for general adult medical examination without abnormal findings: Secondary | ICD-10-CM

## 2017-03-28 DIAGNOSIS — E118 Type 2 diabetes mellitus with unspecified complications: Secondary | ICD-10-CM

## 2017-03-28 NOTE — Assessment & Plan Note (Signed)
Currently taking Keppra 750mg  BID, denies any recent seizure activity Has f/u with Neurology 04/23/17

## 2017-03-28 NOTE — Assessment & Plan Note (Signed)
Ref Range & Units 43mo ago   Cholesterol 0 - 200 mg/dL 115   Triglycerides <150 mg/dL 77   HDL >40 mg/dL 38 Abnormally low    Total CHOL/HDL Ratio RATIO 3.0   VLDL 0 - 40 mg/dL 15   LDL Cholesterol 0 - 99 mg/dL 62    Currently taking Atorvastatin 80mg  daily, been on this dose since 04/2016 (increased from 40mg  after 2/18 hospitalization) Will re-check lipids next week. He has been increasing regular exercise and following heart healthy diet.

## 2017-03-28 NOTE — Assessment & Plan Note (Addendum)
Overall you are doing GREAT! Please continue all medications as directed. Increase water intake and follow heart healthy diet. We will fax over insurance forms to [facility], re Eliquis insurance coverage Please return next week in am for fasting labs(make lab appt) and obtain insurance paperwork. Good luck with injection tomorrow and continue follow-up with Cardiology and Neurology as directed. Follow-up here in 6 months, sooner if needed.

## 2017-03-28 NOTE — Patient Instructions (Addendum)
Heart-Healthy Eating Plan Many factors influence your heart health, including eating and exercise habits. Heart (coronary) risk increases with abnormal blood fat (lipid) levels. Heart-healthy meal planning includes limiting unhealthy fats, increasing healthy fats, and making other small dietary changes. This includes maintaining a healthy body weight to help keep lipid levels within a normal range. What is my plan? Your health care provider recommends that you:  Get no more than ___25__% of the total calories in your daily diet from fat.  Limit your intake of saturated fat to less than __5___% of your total calories each day.  Limit the amount of cholesterol in your diet to less than __300___ mg per day.  What types of fat should I choose?  Choose healthy fats more often. Choose monounsaturated and polyunsaturated fats, such as olive oil and canola oil, flaxseeds, walnuts, almonds, and seeds.  Eat more omega-3 fats. Good choices include salmon, mackerel, sardines, tuna, flaxseed oil, and ground flaxseeds. Aim to eat fish at least two times each week.  Limit saturated fats. Saturated fats are primarily found in animal products, such as meats, butter, and cream. Plant sources of saturated fats include palm oil, palm kernel oil, and coconut oil.  Avoid foods with partially hydrogenated oils in them. These contain trans fats. Examples of foods that contain trans fats are stick margarine, some tub margarines, cookies, crackers, and other baked goods. What general guidelines do I need to follow?  Check food labels carefully to identify foods with trans fats or high amounts of saturated fat.  Fill one half of your plate with vegetables and green salads. Eat 4-5 servings of vegetables per day. A serving of vegetables equals 1 cup of raw leafy vegetables,  cup of raw or cooked cut-up vegetables, or  cup of vegetable juice.  Fill one fourth of your plate with whole grains. Look for the word "whole"  as the first word in the ingredient list.  Fill one fourth of your plate with lean protein foods.  Eat 4-5 servings of fruit per day. A serving of fruit equals one medium whole fruit,  cup of dried fruit,  cup of fresh, frozen, or canned fruit, or  cup of 100% fruit juice.  Eat more foods that contain soluble fiber. Examples of foods that contain this type of fiber are apples, broccoli, carrots, beans, peas, and barley. Aim to get 20-30 g of fiber per day.  Eat more home-cooked food and less restaurant, buffet, and fast food.  Limit or avoid alcohol.  Limit foods that are high in starch and sugar.  Avoid fried foods.  Cook foods by using methods other than frying. Baking, boiling, grilling, and broiling are all great options. Other fat-reducing suggestions include: ? Removing the skin from poultry. ? Removing all visible fats from meats. ? Skimming the fat off of stews, soups, and gravies before serving them. ? Steaming vegetables in water or broth.  Lose weight if you are overweight. Losing just 5-10% of your initial body weight can help your overall health and prevent diseases such as diabetes and heart disease.  Increase your consumption of nuts, legumes, and seeds to 4-5 servings per week. One serving of dried beans or legumes equals  cup after being cooked, one serving of nuts equals 1 ounces, and one serving of seeds equals  ounce or 1 tablespoon.  You may need to monitor your salt (sodium) intake, especially if you have high blood pressure. Talk with your health care provider or dietitian to get  more information about reducing sodium. What foods can I eat? Grains  Breads, including [place], white, pita, wheat, raisin, rye, oatmeal, and [place]. Tortillas that are neither fried nor made with lard or trans fat. Low-fat rolls, including hotdog and hamburger buns and English muffins. Biscuits. Muffins. Waffles. Pancakes. Light popcorn. Whole-grain cereals. Flatbread. Melba  toast. Pretzels. Breadsticks. Rusks. Low-fat snacks and crackers, including oyster, saltine, matzo, graham, animal, and rye. Rice and pasta, including brown rice and those that are made with whole wheat. Vegetables All vegetables. Fruits All fruits, but limit coconut. Meats and Other Protein Sources Lean, well-trimmed beef, veal, pork, and lamb. Chicken and [place] without skin. All fish and shellfish. Wild duck, rabbit, pheasant, and venison. Egg whites or low-cholesterol egg substitutes. Dried beans, peas, lentils, and tofu.Seeds and most nuts. Dairy Low-fat or nonfat cheeses, including ricotta, string, and mozzarella. Skim or 1% milk that is liquid, powdered, or evaporated. Buttermilk that is made with low-fat milk. Nonfat or low-fat yogurt. Beverages Mineral water. Diet carbonated beverages. Sweets and Desserts Sherbets and fruit ices. Honey, jam, marmalade, jelly, and syrups. Meringues and gelatins. Pure sugar candy, such as hard candy, jelly beans, gumdrops, mints, marshmallows, and small amounts of dark chocolate. [employer] Eat all sweets and desserts in moderation. Fats and Oils Nonhydrogenated (trans-free) margarines. Vegetable oils, including soybean, sesame, sunflower, olive, peanut, safflower, corn, canola, and cottonseed. Salad dressings or mayonnaise that are made with a vegetable oil. Limit added fats and oils that you use for cooking, baking, salads, and as spreads. Other Cocoa powder. Coffee and tea. All seasonings and condiments. The items listed above may not be a complete list of recommended foods or beverages. Contact your dietitian for more options. What foods are not recommended? Grains Breads that are made with saturated or trans fats, oils, or whole milk. Croissants. Butter rolls. Cheese breads. Sweet rolls. Donuts. Buttered popcorn. Chow mein noodles. High-fat crackers, such as cheese or butter crackers. Meats and Other Protein Sources Fatty meats, such as  hotdogs, short ribs, sausage, spareribs, bacon, ribeye roast or steak, and mutton. High-fat deli meats, such as salami and bologna. Caviar. Domestic duck and goose. Organ meats, such as kidney, liver, sweetbreads, brains, gizzard, chitterlings, and heart. Dairy Cream, sour cream, cream cheese, and creamed cottage cheese. Whole milk cheeses, including blue (bleu), Monterey Jack, [place], [place], American, [place], Swiss, [place], [place], and [place]. Whole or 2% milk that is liquid, evaporated, or condensed. Whole buttermilk. Cream sauce or high-fat cheese sauce. Yogurt that is made from whole milk. Beverages Regular sodas and drinks with added sugar. Sweets and Desserts Frosting. Pudding. Cookies. Cakes other than angel food cake. Candy that has milk chocolate or white chocolate, hydrogenated fat, butter, coconut, or unknown ingredients. Buttered syrups. Full-fat ice cream or ice cream drinks. Fats and Oils Gravy that has suet, meat fat, or shortening. Cocoa butter, hydrogenated oils, palm oil, coconut oil, palm kernel oil. These can often be found in baked products, candy, fried foods, nondairy creamers, and whipped toppings. Solid fats and shortenings, including bacon fat, salt pork, lard, and butter. Nondairy cream substitutes, such as coffee creamers and sour cream substitutes. Salad dressings that are made of unknown oils, cheese, or sour cream. The items listed above may not be a complete list of foods and beverages to avoid. Contact your dietitian for more information. This information is not intended to replace advice given to you by your health care provider. Make sure you discuss any questions you have with your health care  provider. Document Released: 11/29/2007 Document Revised: 09/09/2015 Document Reviewed: 08/13/2013 Elsevier Interactive Patient Education  2018 [place] in the Home Falls can cause injuries. They can happen to people of all ages. There are many  things you can do to make your home safe and to help prevent falls. What can I do on the outside of my home?  Regularly fix the edges of walkways and driveways and fix any cracks.  Remove anything that might make you trip as you walk through a door, such as a raised step or threshold.  Trim any bushes or trees on the path to your home.  Use bright outdoor lighting.  Clear any walking paths of anything that might make someone trip, such as rocks or tools.  Regularly check to see if handrails are loose or broken. Make sure that both sides of any steps have handrails.  Any raised decks and porches should have guardrails on the edges.  Have any leaves, snow, or ice cleared regularly.  Use sand or salt on walking paths during winter.  Clean up any spills in your garage right away. This includes oil or grease spills. What can I do in the bathroom?  Use night lights.  Install grab bars by the toilet and in the tub and shower. Do not use towel bars as grab bars.  Use non-skid mats or decals in the tub or shower.  If you need to sit down in the shower, use a plastic, non-slip stool.  Keep the floor dry. Clean up any water that spills on the floor as soon as it happens.  Remove soap buildup in the tub or shower regularly.  Attach bath mats securely with double-sided non-slip rug tape.  Do not have throw rugs and other things on the floor that can make you trip. What can I do in the bedroom?  Use night lights.  Make sure that you have a light by your bed that is easy to reach.  Do not use any sheets or blankets that are too big for your bed. They should not hang down onto the floor.  Have a firm chair that has side arms. You can use this for support while you get dressed.  Do not have throw rugs and other things on the floor that can make you trip. What can I do in the kitchen?  Clean up any spills right away.  Avoid walking on wet floors.  Keep items that you use a lot  in easy-to-reach places.  If you need to reach something above you, use a strong step stool that has a grab bar.  Keep electrical cords out of the way.  Do not use floor polish or wax that makes floors slippery. If you must use wax, use non-skid floor wax.  Do not have throw rugs and other things on the floor that can make you trip. What can I do with my stairs?  Do not leave any items on the stairs.  Make sure that there are handrails on both sides of the stairs and use them. Fix handrails that are broken or loose. Make sure that handrails are as long as the stairways.  Check any carpeting to make sure that it is firmly attached to the stairs. Fix any carpet that is loose or worn.  Avoid having throw rugs at the top or bottom of the stairs. If you do have throw rugs, attach them to the floor with carpet tape.  Make sure  that you have a light switch at the top of the stairs and the bottom of the stairs. If you do not have them, ask someone to add them for you. What else can I do to help prevent falls?  Wear shoes that: ? Do not have high heels. ? Have rubber bottoms. ? Are comfortable and fit you well. ? Are closed at the toe. Do not wear sandals.  If you use a stepladder: ? Make sure that it is fully opened. Do not climb a closed stepladder. ? Make sure that both sides of the stepladder are locked into place. ? Ask someone to hold it for you, if possible.  Clearly mark and make sure that you can see: ? Any grab bars or handrails. ? First and last steps. ? Where the edge of each step is.  Use tools that help you move around (mobility aids) if they are needed. These include: ? Canes. ? Walkers. ? Scooters. ? Crutches.  Turn on the lights when you go into a dark area. Replace any light bulbs as soon as they burn out.  Set up your furniture so you have a clear path. Avoid moving your furniture around.  If any of your floors are uneven, fix them.  If there are any pets  around you, be aware of where they are.  Review your medicines with your doctor. Some medicines can make you feel dizzy. This can increase your chance of falling. Ask your doctor what other things that you can do to help prevent falls. This information is not intended to replace advice given to you by your health care provider. Make sure you discuss any questions you have with your health care provider. Document Released: 12/16/2008 Document Revised: 07/28/2015 Document Reviewed: 03/26/2014 Elsevier Interactive Patient Education  [employer].  Overall you are doing GREAT! Please continue all medications as directed. Increase water intake and follow heart healthy diet. We will fax over insurance forms to [facility]. Please return next week in am for fasting labs(make lab appt) and obtain insurance paperwork. Good luck with injection tomorrow and continue follow-up with Cardiology and Neurology as directed. Follow-up here in 6 months, sooner if needed. NICE TO SEE YOU!

## 2017-03-28 NOTE — Assessment & Plan Note (Signed)
BP at 109/71, HR 74

## 2017-03-29 ENCOUNTER — Ambulatory Visit (HOSPITAL_BASED_OUTPATIENT_CLINIC_OR_DEPARTMENT_OTHER): Payer: Medicare Other | Admitting: Physical Medicine & Rehabilitation

## 2017-03-29 ENCOUNTER — Encounter: Payer: Self-pay | Admitting: Physical Medicine & Rehabilitation

## 2017-03-29 VITALS — BP 150/80 | HR 62 | Resp 14

## 2017-03-29 DIAGNOSIS — M1711 Unilateral primary osteoarthritis, right knee: Secondary | ICD-10-CM

## 2017-03-29 DIAGNOSIS — I69998 Other sequelae following unspecified cerebrovascular disease: Secondary | ICD-10-CM | POA: Diagnosis not present

## 2017-03-29 DIAGNOSIS — R269 Unspecified abnormalities of gait and mobility: Secondary | ICD-10-CM | POA: Diagnosis not present

## 2017-03-29 DIAGNOSIS — I69398 Other sequelae of cerebral infarction: Secondary | ICD-10-CM

## 2017-03-29 NOTE — Patient Instructions (Signed)
Hylan G-F 20 intra-articular injection What is this medicine? HYLAN G-F 20 (HI lan G F 20) is used to treat osteoarthritis of the knee. It lubricates and cushions the joint, reducing pain in the knee. This medicine may be used for other purposes; ask your health care provider or pharmacist if you have questions. COMMON BRAND NAME(S): Synvisc, Synvisc-One What should I tell my health care provider before I take this medicine? They need to know if you have any of these conditions: -severe knee inflammation -skin conditions or sensitivity -skin or joint infection -venous stasis -an unusual or allergic reaction to hylan G-F 20, hyaluronan (sodium hyaluronate), eggs, other medicines, foods, dyes, or preservatives -pregnant or trying to get pregnant -breast-feeding How should I use this medicine? This medicine is for injection into the knee joint. It is given by a health care professional in a hospital or clinic setting. Talk to your pediatrician regarding the use of this medicine in children. This medicine is not approved for use in children. Overdosage: If you think you have taken too much of this medicine contact a poison control center or emergency room at once. NOTE: This medicine is only for you. Do not share this medicine with others. What if I miss a dose? Keep appointments for follow-up doses as directed. For Synvisc, you will need weekly injections for 3 doses. It is important not to miss your dose. If you will receive Synvisc-One, then only 1 injection will be needed. Call your doctor or health care professional if you are unable to keep an appointment. What may interact with this medicine? Do not take this medicine with any of the following medications: -other injections for the joint like steroids or anesthetics -certain skin disinfectants like benzalkonium chloride This list may not describe all possible interactions. Give your health care provider a list of all the medicines, herbs,  non-prescription drugs, or dietary supplements you use. Also tell them if you smoke, drink alcohol, or use illegal drugs. Some items may interact with your medicine. What should I watch for while using this medicine? Tell your doctor or healthcare professional if your symptoms do not start to get better or if they get worse. Your condition will be monitored carefully while you are receiving this medicine. Most persons get pain relief for up to 6 months after treatment. Avoid strenuous activities (high-impact sports, jogging) or major weight-bearing activities for 48 hours after the injection. What side effects may I notice from receiving this medicine? Side effects that you should report to your doctor or health care professional as soon as possible: -allergic reactions like skin rash, itching or hives, swelling of the face, lips, or tongue -difficulty breathing -fever or chills -severe joint pain or swelling -unusual bleeding or bruising Side effects that usually do not require medical attention (report to your doctor or health care professional if they continue or are bothersome): -dizziness -flushing -general ill feeling or flu-like symptoms -headache -minor joint pain or swelling -muscle pain or cramps -pain, redness, irritation or bruising at site of injection This list may not describe all possible side effects. Call your doctor for medical advice about side effects. You may report side effects to FDA at 1-800-FDA-1088. Where should I keep my medicine? This drug is given in a hospital or clinic and will not be stored at home. NOTE: This sheet is a summary. It may not cover all possible information. If you have questions about this medicine, talk to your doctor, pharmacist, or health care provider.    2018 Elsevier/Gold Standard (2015-03-24 11:48:41)

## 2017-03-29 NOTE — Progress Notes (Signed)
Indication end-stage osteoarthritis of the knee with pain that limits mobility and does not respond to oral medications.   Medial aspect of the knee was imaged, identified joint space, identified patella, femur, tibia. 25-gauge 1.5 inch needle was inserted under ultrasound guidance and 3 mL of 1% lidocaine were infiltrated into the skin and subcutaneous tissue. Then a 21-gauge, 2 inch needle was inserted along the same needle track Into the joint  6 mL of Synvisc-1 were injected. Patient tolerated procedure well Post procedure instructions given  Pt finished HHPT request OP referral to cont gait training and balance training, LE strengthening

## 2017-04-01 ENCOUNTER — Other Ambulatory Visit: Payer: Self-pay

## 2017-04-01 NOTE — Telephone Encounter (Signed)
We have not prescribed these medications for the patient previously.  Please review and refill if appropriate.  T. Avery Eustice, CMA  

## 2017-04-02 ENCOUNTER — Other Ambulatory Visit: Payer: Medicare Other

## 2017-04-02 DIAGNOSIS — K219 Gastro-esophageal reflux disease without esophagitis: Secondary | ICD-10-CM | POA: Diagnosis not present

## 2017-04-02 DIAGNOSIS — I2510 Atherosclerotic heart disease of native coronary artery without angina pectoris: Secondary | ICD-10-CM

## 2017-04-02 DIAGNOSIS — E782 Mixed hyperlipidemia: Secondary | ICD-10-CM | POA: Diagnosis not present

## 2017-04-02 DIAGNOSIS — I10 Essential (primary) hypertension: Secondary | ICD-10-CM

## 2017-04-02 DIAGNOSIS — E118 Type 2 diabetes mellitus with unspecified complications: Secondary | ICD-10-CM

## 2017-04-02 DIAGNOSIS — R5383 Other fatigue: Secondary | ICD-10-CM | POA: Diagnosis not present

## 2017-04-02 DIAGNOSIS — N182 Chronic kidney disease, stage 2 (mild): Secondary | ICD-10-CM | POA: Diagnosis not present

## 2017-04-02 DIAGNOSIS — R7989 Other specified abnormal findings of blood chemistry: Secondary | ICD-10-CM | POA: Diagnosis not present

## 2017-04-03 LAB — COMPREHENSIVE METABOLIC PANEL
ALT: 15 IU/L (ref 0–44)
AST: 31 IU/L (ref 0–40)
Albumin/Globulin Ratio: 1.6 (ref 1.2–2.2)
Albumin: 4 g/dL (ref 3.5–4.7)
Alkaline Phosphatase: 74 IU/L (ref 39–117)
BUN/Creatinine Ratio: 14 (ref 10–24)
BUN: 15 mg/dL (ref 8–27)
Bilirubin Total: 0.5 mg/dL (ref 0.0–1.2)
CO2: 18 mmol/L — ABNORMAL LOW (ref 20–29)
Calcium: 9.2 mg/dL (ref 8.6–10.2)
Chloride: 105 mmol/L (ref 96–106)
Creatinine, Ser: 1.09 mg/dL (ref 0.76–1.27)
GFR calc Af Amer: 70 mL/min/{1.73_m2} (ref >59)
GFR calc non Af Amer: 61 mL/min/{1.73_m2} (ref >59)
Globulin, Total: 2.5 g/dL (ref 1.5–4.5)
Glucose: 91 mg/dL (ref 65–99)
Potassium: 4.3 mmol/L (ref 3.5–5.2)
Sodium: 140 mmol/L (ref 134–144)
Total Protein: 6.5 g/dL (ref 6.0–8.5)

## 2017-04-03 LAB — LIPID PANEL
Chol/HDL Ratio: 3 ratio (ref 0.0–5.0)
Cholesterol, Total: 110 mg/dL (ref 100–199)
HDL: 37 mg/dL — ABNORMAL LOW (ref >39)
LDL Calculated: 57 mg/dL (ref 0–99)
Triglycerides: 80 mg/dL (ref 0–149)
VLDL Cholesterol Cal: 16 mg/dL (ref 5–40)

## 2017-04-03 LAB — CBC WITH DIFFERENTIAL/PLATELET
Basophils Absolute: 0 10*3/uL (ref 0.0–0.2)
Basos: 1 %
EOS (ABSOLUTE): 0.2 10*3/uL (ref 0.0–0.4)
Eos: 3 %
Hematocrit: 40.9 % (ref 37.5–51.0)
Hemoglobin: 13.6 g/dL (ref 13.0–17.7)
Immature Grans (Abs): 0 10*3/uL (ref 0.0–0.1)
Immature Granulocytes: 0 %
Lymphocytes Absolute: 2 10*3/uL (ref 0.7–3.1)
Lymphs: 24 %
MCH: 30.2 pg (ref 26.6–33.0)
MCHC: 33.3 g/dL (ref 31.5–35.7)
MCV: 91 fL (ref 79–97)
Monocytes Absolute: 0.9 10*3/uL (ref 0.1–0.9)
Monocytes: 10 %
Neutrophils Absolute: 5.4 10*3/uL (ref 1.4–7.0)
Neutrophils: 62 %
Platelets: 372 10*3/uL (ref 150–379)
RBC: 4.51 x10E6/uL (ref 4.14–5.80)
RDW: 15.2 % (ref 12.3–15.4)
WBC: 8.6 10*3/uL (ref 3.4–10.8)

## 2017-04-03 LAB — HEMOGLOBIN A1C
Est. average glucose Bld gHb Est-mCnc: 117 mg/dL
Hgb A1c MFr Bld: 5.7 % — ABNORMAL HIGH (ref 4.8–5.6)

## 2017-04-03 LAB — TSH: TSH: 4.89 u[IU]/mL — ABNORMAL HIGH (ref 0.450–4.500)

## 2017-04-04 ENCOUNTER — Other Ambulatory Visit: Payer: Self-pay

## 2017-04-04 NOTE — Telephone Encounter (Signed)
We have not prescribed these medications for the patient previously.  Please review and refill if appropriate.  T. Emmaleigh Longo, CMA  

## 2017-04-05 ENCOUNTER — Other Ambulatory Visit: Payer: Self-pay

## 2017-04-05 ENCOUNTER — Telehealth: Payer: Self-pay | Admitting: Neurology

## 2017-04-05 LAB — T3, FREE: T3, Free: 2.3 pg/mL (ref 2.0–4.4)

## 2017-04-05 LAB — SPECIMEN STATUS REPORT

## 2017-04-05 LAB — T4, FREE: Free T4: 0.77 ng/dL — ABNORMAL LOW (ref 0.82–1.77)

## 2017-04-05 MED ORDER — LEVETIRACETAM 750 MG PO TABS: 750.0000 mg | ORAL_TABLET | Freq: Two times a day (BID) | ORAL | 0 refills | Status: DC

## 2017-04-05 NOTE — Telephone Encounter (Signed)
Refill done until pt comes in to see Dr. Erlinda Hong next month.

## 2017-04-05 NOTE — Telephone Encounter (Signed)
Pt's wife called he is needing refill for levETIRAcetam (KEPPRA) 750 MG tablet bid, could he have either a 90 day supply or even a yr supply? Please send to [facility]. Pt will be out on Tuesday. She is aware the clinic closes at noon today

## 2017-04-10 ENCOUNTER — Telehealth: Payer: Self-pay | Admitting: Internal Medicine

## 2017-04-10 NOTE — Telephone Encounter (Signed)
New message  Pt wife calling to confirm appt, but it was cancelled and they want to know why. Please call

## 2017-04-15 ENCOUNTER — Encounter: Payer: Medicare Other | Admitting: Internal Medicine

## 2017-04-16 ENCOUNTER — Encounter: Payer: Self-pay | Admitting: Physical Therapy

## 2017-04-16 ENCOUNTER — Other Ambulatory Visit: Payer: Self-pay

## 2017-04-16 ENCOUNTER — Ambulatory Visit: Payer: Medicare Other | Attending: Physical Medicine & Rehabilitation | Admitting: Physical Therapy

## 2017-04-16 DIAGNOSIS — R2681 Unsteadiness on feet: Secondary | ICD-10-CM | POA: Insufficient documentation

## 2017-04-16 DIAGNOSIS — R2689 Other abnormalities of gait and mobility: Secondary | ICD-10-CM

## 2017-04-16 DIAGNOSIS — M6281 Muscle weakness (generalized): Secondary | ICD-10-CM | POA: Insufficient documentation

## 2017-04-16 NOTE — Patient Instructions (Signed)
  Reviewed patient's current HEP (given by HHPT) which includes:  Multiple sets of SLR Supine or seated marching Supine or seated hip abdct [place] Standing at sink (EO 30 sec, EC 30 sec) Tandem stance (he has not been doing per wife) Walk with walker every other day (inside home; he thinks he can go ~180 ft) Use LEs to "walk" wheelchair around house.

## 2017-04-16 NOTE — Therapy (Signed)
[facility] [address] [place], [place], [address] Phone: [phone]   Fax:  [phone]  Physical Therapy Evaluation  Patient Details  Name: Herbert Marquez MRN: [id number] Date of Birth: 09/04/1927 Referring Provider: Charlett Blake, MD   Encounter Date: 04/16/2017  PT End of Session - 04/16/17 1246    Visit Number  1    Number of Visits  17    Date for PT Re-Evaluation  06/15/17    Authorization Type  [employer] medicare    Authorization Time Period  04/16/17 to 06/15/17    PT Start Time  1100    PT Stop Time  1144    PT Time Calculation (min)  44 min    Activity Tolerance  Patient tolerated treatment well    Behavior During Therapy  [facility] for tasks assessed/performed       Past Medical History:  Diagnosis Date  . Arthritis    "pretty much all over"   . Basal cell carcinoma    "several burned off his body, face, head"  . BPH (benign prostatic hypertrophy)   . Coronary artery disease    a. s/p PCI of RCA in 2006  . CVA (cerebral infarction)    a. 06/2015: left thalamic and bilateral PCA  . GERD (gastroesophageal reflux disease)   . Hyperlipidemia   . Hypertension   . Presence of permanent cardiac pacemaker   . Stroke ([place])   . TIA (transient ischemic attack)    Approximately 6 weeks post-cardiac catheterization.     Past Surgical History:  Procedure Laterality Date  . CARDIOVASCULAR STRESS TEST  07/01/2007   EF 74%  . CATARACT EXTRACTION, BILATERAL    . CORONARY ANGIOPLASTY WITH STENT PLACEMENT  10/2004   stenting x 2 to RCA  . FEMUR IM NAIL Right 11/26/2015   Procedure: INTRAMEDULLARY RIGHT (IM) NAIL FEMORAL;  Surgeon: Rod Can, MD;  Location: [facility];  Service: Orthopedics;  Laterality: Right;  . FRACTURE SURGERY    . HERNIA REPAIR    . HIP ARTHROPLASTY  03/09/2011   Procedure: ARTHROPLASTY BIPOLAR HIP;  Surgeon: Mauri Pole;  Location: [facility];  Service: Orthopedics;  Laterality: Left;  .  INSERT / REPLACE / REMOVE PACEMAKER  10/23/2016  . LAPAROSCOPIC INCISIONAL / UMBILICAL / VENTRAL HERNIA REPAIR     "below his naval"  . PACEMAKER IMPLANT N/A 10/23/2016   Procedure: Pacemaker Implant;  Surgeon: Thompson Grayer, MD;  Location: [facility];  Service: Cardiovascular;  Laterality: N/A;    There were no vitals filed for this visit.   Subjective Assessment - 04/16/17 1108    Subjective  Prior to recent hospitalization, he used w/c inside home primarily and walked with wife's supervision, RLE brace, and RW. He states he uses w/c at times due to fear of falling and sometimes due to rt knee pain.     Patient is accompained by:  Family member    Pertinent History  L THR, R hip fracture, h/o multiple CVAs, vascular dementia.    Patient Stated Goals  Want to be walking better. Walking more in the home (not using wheelchair).     Currently in Pain?  No/denies sitting no pain         [facility] PT Assessment - 04/16/17 1116      Assessment   Medical Diagnosis  seizure; prior CVA    Referring Provider  Charlett Blake, MD    Onset Date/Surgical Date  01/08/17  Prior Therapy  SNF, HH      Precautions   Precautions  Fall      Restrictions   Weight Bearing Restrictions  No      Balance Screen   Has the patient fallen in the past 6 months  Yes    How many times?  0    Has the patient had a decrease in activity level because of a fear of falling?   Yes    Is the patient reluctant to leave their home because of a fear of falling?   No      Home Environment   Living Environment  Private residence    Living Arrangements  Spouse/significant other    Available Help at Discharge  Family;Available 24 hours/day    Type of [place]  One level    Home Equipment  Wheelchair - manual;Walker - 2 wheels;Shower seat;Walker - 4 wheels;Cane - single point;Bedside commode;Toilet riser;Grab bars - toilet;Grab bars - tub/shower       Prior Function   Level of Independence  Needs assistance with gait;Independent with transfers    Vocation  Retired      [employer]   Overall Cognitive Status  History of cognitive impairments - at baseline      Observation/Other Assessments   Observations  arrives in wheelchair    Focus on Therapeutic Outcomes (FOTO)   NA      Sensation   Light Touch  Impaired Detail    Light Touch Impaired Details  Impaired RLE    Proprioception  Impaired Detail    Proprioception Impaired Details  Impaired RLE ankle absent except at endranges      Coordination   Gross Motor Movements are Fluid and Coordinated  Yes    Fine Motor Movements are Fluid and Coordinated  Yes      Posture/Postural Control   Posture/Postural Control  Postural limitations    Postural Limitations  Rounded Shoulders;Forward head      ROM / Strength   AROM / PROM / Strength  Strength      AROM   Overall AROM   Deficits    Overall AROM Comments  Rt knee flexion contracture ~15 degrees. pt reports he continues to work on stretching his knee.       Strength   Overall Strength  Deficits    Overall Strength Comments  Rt ankle DF 3+, knee extension 3+, hip flexion 4, hip abdct 3+; LLE WNL       Transfers   Transfers  Sit to Stand;Stand to Sit;Squat Pivot Transfers    Sit to Stand  5: Supervision;With upper extremity assist    Sit to Stand Details (indicate cue type and reason)  without armrests min assist for full hip knee extension and balance (from elevated seat)    Five time sit to stand comments   19.6 >15 sec is high fall risk; pt using hands/armrests on w/c    Stand to Sit  5: Supervision    Squat Pivot Transfers  6: Modified independent (Device/Increase time) to his rt and left; off low mat table      Ambulation/Gait   Ambulation/Gait  No pt did not bring his rt knee brace ?KAFO and fears buckling             Objective measurements completed on examination: See above findings.  PT  Education - 04/16/17 1245    Education provided  Yes    Education Details  results of evaluation; discussed goals and PT POC; pt must bring rt knee brace to appointments    Person(s) Educated  Patient;Spouse    Methods  Explanation    Comprehension  Verbalized understanding       PT Short Term Goals - 04/16/17 1706      PT SHORT TERM GOAL #1   Title  The patient will perform HEP with assist from wife as needed for LE strengthening, standing balance and general mobility.  TARGET DATE FOR ALL STGs:  05/16/16    Time  4    Period  Weeks    Status  New    Target Date  05/16/17      PT SHORT TERM GOAL #2   Title  Patient will complete Berg Balance Assessment with goal to be set as appropriate.     Time  1    Period  Weeks    Status  New      PT SHORT TERM GOAL #3   Title  Patient will complete gait velocity assessment with goal to be set as appropriate.     Time  1    Period  Weeks    Status  New      PT SHORT TERM GOAL #4   Title  Patient will improve 5x sit to stand to <=17 sec (with use of hands) to demonstrate improved LE strength and balance.     Time  4    Period  Weeks    Status  New        PT Long Term Goals - 04/16/17 1709      PT LONG TERM GOAL #1   Title  The patient will perform updated HEP with assist from spouse. (Target for LTGs 06/15/17)    Time  8    Period  Weeks    Status  New    Target Date  06/15/17      PT LONG TERM GOAL #2   Title  Patient will ambulate 300 ft without seated rest break with supervision to improve his ability and confidence to ambulate in his home.    Time  8    Period  Weeks    Status  New      PT LONG TERM GOAL #3   Title  Patient will improve gait velocity to ________ to demonstrate decreasing fall risk. (goal to be set based on level at 4 week/STG assessment)    Time  8    Period  Weeks    Status  New      PT LONG TERM GOAL #4   Title  Patient will improve Berg score to ________ to demonstrate decreasing fall risk. (goal  to be set based on level at 4 week/STG assessment)    Time  8    Period  Weeks    Status  New      PT LONG TERM GOAL #5   Title  Patient will improve 5x sit to stand to <=15 seconds (with use of UEs) to demonstrate improved LE strength and balance.    Time  8    Period  Weeks    Status  New             Plan - 04/16/17 1248    Clinical Impression Statement  Patient referred for OPPT evaluation after completing SNF and [facility] PT s/p  seizure 01/08/17 with prolonged hospitalization. Patient notes generalized weakness "from the time off my feet." Patient reports his fear of falling has limited his progress with his walking in the past and he wants to regain strength and balance so he can walk in the home more than using the w/c in the home. Wife reports prior to seizure, pt was walking to bathroom or kitchen with supervision (she was not always at his side). Patient can benefit from OPPT for the impairments listed below through the interventions listed below.     History and Personal Factors relevant to plan of care:  PMH-CAD,HTN, L THR, R hip fracture,  h/o multiple CVAs, vascular dementia.    Clinical Presentation  Evolving    Clinical Presentation due to:  approximately 3 months since last seizure    Clinical Decision Making  Low    Rehab Potential  Good for goals    Clinical Impairments Affecting Rehab Potential  rt knee pain    PT Frequency  2x / week    PT Duration  8 weeks    PT Treatment/Interventions  ADLs/Self Care Home Management;Gait training;DME Instruction;Functional mobility training;Therapeutic activities;Therapeutic exercise;Balance training;Neuromuscular re-education;Cognitive remediation;Patient/family education;Orthotic Fit/Training    PT Next Visit Plan  Assess gait velocity, Berg (didn't have knee brace on eval) and finish setting goals (also compare values to when discharged from [facility] 11/2016); if time allows, add standing hip exercises at counter to HEP       Patient  will benefit from skilled therapeutic intervention in order to improve the following deficits and impairments:  Abnormal gait, Decreased activity tolerance, Decreased balance, Decreased cognition, Decreased mobility, Decreased range of motion, Difficulty walking, Impaired sensation, Postural dysfunction, Pain  Visit Diagnosis: Other abnormalities of gait and mobility - Plan: PT plan of care cert/re-cert  Muscle weakness (generalized) - Plan: PT plan of care cert/re-cert  Unsteadiness on feet - Plan: PT plan of care cert/re-cert     Problem List Patient Active Problem List   Diagnosis Date Noted  . TIA (transient ischemic attack)   . Presence of permanent cardiac pacemaker   . Hypertension   . Hyperlipidemia   . Coronary artery disease   . Basal cell carcinoma   . Arthritis   . Pressure injury of skin 01/08/2017  . Stroke ([place]) 01/07/2017  . Ischemic stroke ([place])   . Second degree Mobitz II AV block 10/23/2016  . Seizures ([place]) 10/09/2016  . Healthcare maintenance 10/01/2016  . Restlessness and agitation 07/02/2016  . Vascular dementia with behavior disturbance 06/29/2016  . Hemiparesis and other late effects of cerebrovascular accident ([place]) 06/29/2016  . Embolic stroke ([place]) 67/89/3810  . Right hemiparesis ([place])   . History of CVA with residual deficit   . Chronic anticoagulation   . Atrial fibrillation with rapid ventricular response ([place])   . Seizure prophylaxis   . Diastolic dysfunction   . Bacteremia due to Gram-positive bacteria   . Altered mental status   . Dementia without behavioral disturbance   . Leukocytosis   . Acute blood loss anemia   . Acute encephalopathy 06/04/2016  . PAF (paroxysmal atrial fibrillation) ([place])   . Iron deficiency anemia 04/04/2016  . History of CVA (cerebrovascular accident) 04/04/2016  . Cerebral hemorrhage (HCC) w/ SDH s/p IV tPA   . Coronary artery disease involving native coronary artery of native heart without angina pectoris    . Orthostatic hypotension   . History of right hip replacement   . Acute ischemic stroke ([place]) -  L temporal lobe s/p tPA 03/30/2016  . BPH (benign prostatic hyperplasia) 11/25/2015  . GERD (gastroesophageal reflux disease) 11/25/2015  . CKD (chronic kidney disease), stage II 11/25/2015  . Overactive bladder   . Cerebrovascular accident (CVA) ([place]) 09/18/2015  . Gait disturbance, post-stroke 06/23/2015  . 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 Thalamic infarction ([place]) 06/21/2015  . Benign essential HTN   . Type 2 diabetes mellitus with complication, without long-term current use of insulin ([place])   . Dysphagia as late effect of cerebrovascular disease   . Hyponatremia   . HLD (hyperlipidemia)   . COPD (chronic obstructive pulmonary disease) ([place]) 03/09/2011    Rexanne Mano, PT 04/16/2017, 5:23 PM  [facility] [address] [place], [place], [address] Phone: [phone]   Fax:  [phone]  Name: LAKER THOMPSON MRN: [id number] Date of Birth: 1927/11/07

## 2017-04-18 ENCOUNTER — Ambulatory Visit (INDEPENDENT_AMBULATORY_CARE_PROVIDER_SITE_OTHER): Payer: Medicare Other | Admitting: Internal Medicine

## 2017-04-18 ENCOUNTER — Encounter: Payer: Self-pay | Admitting: Internal Medicine

## 2017-04-18 VITALS — BP 130/80 | HR 63 | Ht 66.0 in | Wt 162.0 lb

## 2017-04-18 DIAGNOSIS — Z950 Presence of cardiac pacemaker: Secondary | ICD-10-CM

## 2017-04-18 DIAGNOSIS — I441 Atrioventricular block, second degree: Secondary | ICD-10-CM

## 2017-04-18 DIAGNOSIS — I2510 Atherosclerotic heart disease of native coronary artery without angina pectoris: Secondary | ICD-10-CM | POA: Diagnosis not present

## 2017-04-18 DIAGNOSIS — I480 Paroxysmal atrial fibrillation: Secondary | ICD-10-CM

## 2017-04-18 LAB — CUP PACEART INCLINIC DEVICE CHECK
Battery Remaining Longevity: 135 mo
Battery Voltage: 3.01 V
Brady Statistic RA Percent Paced: 18 %
Brady Statistic RV Percent Paced: 4.4 %
Date Time Interrogation Session: 20190214150244
Implantable Lead Implant Date: 20180821
Implantable Lead Implant Date: 20180821
Implantable Lead Location: 753859
Implantable Lead Location: 753860
Implantable Pulse Generator Implant Date: 20180821
Lead Channel Impedance Value: 475 Ohm
Lead Channel Impedance Value: 600 Ohm
Lead Channel Pacing Threshold Amplitude: 0.75 V
Lead Channel Pacing Threshold Amplitude: 0.75 V
Lead Channel Pacing Threshold Amplitude: 0.75 V
Lead Channel Pacing Threshold Amplitude: 0.75 V
Lead Channel Pacing Threshold Pulse Width: 0.5 ms
Lead Channel Pacing Threshold Pulse Width: 0.5 ms
Lead Channel Pacing Threshold Pulse Width: 0.5 ms
Lead Channel Pacing Threshold Pulse Width: 0.5 ms
Lead Channel Sensing Intrinsic Amplitude: 12 mV
Lead Channel Sensing Intrinsic Amplitude: 4.2 mV
Lead Channel Setting Pacing Amplitude: 0.875
Lead Channel Setting Pacing Amplitude: 1.625
Lead Channel Setting Pacing Pulse Width: 0.5 ms
Lead Channel Setting Sensing Sensitivity: 2 mV
Pulse Gen Model: 2272
Pulse Gen Serial Number: 8933379

## 2017-04-18 NOTE — Progress Notes (Signed)
PCP: [name], NP Primary Cardiologist:  Dr Acie Fredrickson Primary EP:  Dr Spero Geralds is a 84 y.o. male who presents today for routine electrophysiology followup.  I have not seen him since his PPM was implanted in August.  He was hospitalized 11/18 with seizure.  He saw Dr Acie Fredrickson 03/12/17 (his note reviewed)  Today, he denies symptoms of palpitations, chest pain, shortness of breath,  lower extremity edema, dizziness, presyncope, or syncope.  The patient is otherwise without complaint today.   Past Medical History:  Diagnosis Date  . Arthritis    "pretty much all over"   . Basal cell carcinoma    "several burned off his body, face, head"  . BPH (benign prostatic hypertrophy)   . Coronary artery disease    a. s/p PCI of RCA in 2006  . CVA (cerebral infarction)    a. 06/2015: left thalamic and bilateral PCA  . GERD (gastroesophageal reflux disease)   . Hyperlipidemia   . Hypertension   . Presence of permanent cardiac pacemaker   . Stroke ([place])   . TIA (transient ischemic attack)    Approximately 6 weeks post-cardiac catheterization.    Past Surgical History:  Procedure Laterality Date  . CARDIOVASCULAR STRESS TEST  07/01/2007   EF 74%  . CATARACT EXTRACTION, BILATERAL    . CORONARY ANGIOPLASTY WITH STENT PLACEMENT  10/2004   stenting x 2 to RCA  . FEMUR IM NAIL Right 11/26/2015   Procedure: INTRAMEDULLARY RIGHT (IM) NAIL FEMORAL;  Surgeon: Rod Can, MD;  [facility];  Service: Orthopedics;  Laterality: Right;  . FRACTURE SURGERY    . HERNIA REPAIR    . HIP ARTHROPLASTY  03/09/2011   Procedure: ARTHROPLASTY BIPOLAR HIP;  Surgeon: Mauri Pole;  [facility];  Service: Orthopedics;  Laterality: Left;  . INSERT / REPLACE / REMOVE PACEMAKER  10/23/2016  . LAPAROSCOPIC INCISIONAL / UMBILICAL / VENTRAL HERNIA REPAIR     "below his naval"  . PACEMAKER IMPLANT N/A 10/23/2016   Procedure: Pacemaker Implant;  Surgeon: Thompson Grayer, MD;  Location: [facility];  Service: Cardiovascular;  Laterality: N/A;    ROS- all systems are reviewed and negative except as per HPI above  Current Outpatient Medications  Medication Sig Dispense Refill  . acetaminophen (TYLENOL) 325 MG tablet Take 2 tablets (650 mg total) every 4 (four) hours as needed by mouth for mild pain (or temp > 37.5 C (99.5 F)). 30 tablet 0  . apixaban (ELIQUIS) 5 MG TABS tablet Take 1 tablet (5 mg total) by mouth 2 (two) times daily. Resume 10/25/16 evening dose 60 tablet 1  . aspirin EC 81 MG tablet Take 1 tablet (81 mg total) by mouth daily.    [name] atorvastatin (LIPITOR) 80 MG tablet TAKE 1 TABLET BY MOUTH DAILY. 90 tablet 0  . levETIRAcetam (KEPPRA) 750 MG tablet Take 1 tablet (750 mg total) by mouth 2 (two) times daily. 189 tablet 0  . Multiple Vitamin (MULTIVITAMIN WITH MINERALS) TABS tablet Take 1 tablet by mouth daily.    . nitroGLYCERIN (NITROSTAT) 0.4 MG SL tablet Place 1 tablet (0.4 mg total) under the tongue every 5 (five) minutes as needed for chest pain. 25 tablet 5  . polyvinyl alcohol (ARTIFICIAL TEARS) 1.4 % ophthalmic solution Place 1 drop into both eyes daily as needed for dry eyes.    [name] QUEtiapine (SEROQUEL) 25 MG tablet TAKE 1 TABLET BY MOUTH AT BEDTIME. 30 tablet 2  No current facility-administered medications for this visit.     Physical Exam: Vitals:   04/18/17 1120  BP: 130/80  Pulse: 63  Weight: 162 lb (73.5 kg)  Height: 5\' 6"  (1.676 m)    GEN- The patient is elderly appearing, alert and oriented x 3 today.  In a wheelchair today Head- normocephalic, atraumatic Eyes-  Sclera clear, conjunctiva pink Ears- hearing intact Oropharynx- clear Lungs- Clear to ausculation bilaterally, normal work of breathing Chest- pacemaker pocket is well healed Heart- Regular rate and rhythm, no murmurs, rubs or gallops, PMI not laterally displaced GI- soft, NT, ND, + BS Extremities- no clubbing, cyanosis, or edema  Pacemaker interrogation- reviewed in  detail today,  See PACEART report  ekg tracing ordered today is personally reviewed and shows sinus rhythm 63 bpm, PR 210 msec, LAHB  Assessment and Plan:  1. Symptomatic intermittent complete heart block Normal pacemaker function See Pace Art report No changes today 1:1 episodes of tachycardia are noted,  Asymptomatic, likely AVNRT  2. Paroxysmal atrial fibrillation On eliquis (chads2vasc score is 6) Well controlled  3. Recurrent strokes  On ASA and eliquis (per Dr Erlinda Hong).  I am ok with stopping ASA and continuing eliquis if ok with Dr Erlinda Hong.  4. CAD No ischemic symptoms No changes  [place] Return to see EP NP every year Follow-up with Dr Acie Fredrickson as scheduled  Thompson Grayer MD, [facility] 04/18/2017 11:46 AM

## 2017-04-18 NOTE — Patient Instructions (Addendum)
Medication Instructions:  Your physician recommends that you continue on your current medications as directed. Please refer to the Current Medication list given to you today.  Labwork: None ordered.  Testing/Procedures: None ordered.  Follow-Up: Your physician wants you to follow-up in: 12 months with Chanetta Marshall, NP.   You will receive a reminder letter in the mail two months in advance. If you don't receive a letter, please call our office to schedule the follow-up appointment.  Remote monitoring is used to monitor your Pacemaker from home. This monitoring reduces the number of office visits required to check your device to one time per year. It allows [place] to keep an eye on the functioning of your device to ensure it is working properly. You are scheduled for a device check from home on 06/11/2017. You may send your transmission at any time that day. If you have a wireless device, the transmission will be sent automatically. After your physician reviews your transmission, you will receive a postcard with your next transmission date.  Any Other Special Instructions Will Be Listed Below (If Applicable).  If you need a refill on your cardiac medications before your next appointment, please call your pharmacy.

## 2017-04-23 ENCOUNTER — Ambulatory Visit (INDEPENDENT_AMBULATORY_CARE_PROVIDER_SITE_OTHER): Payer: Medicare Other | Admitting: Neurology

## 2017-04-23 ENCOUNTER — Encounter: Payer: Self-pay | Admitting: Neurology

## 2017-04-23 VITALS — BP 129/79 | HR 67

## 2017-04-23 DIAGNOSIS — R569 Unspecified convulsions: Secondary | ICD-10-CM

## 2017-04-23 DIAGNOSIS — Z8673 Personal history of transient ischemic attack (TIA), and cerebral infarction without residual deficits: Secondary | ICD-10-CM

## 2017-04-23 DIAGNOSIS — I2510 Atherosclerotic heart disease of native coronary artery without angina pectoris: Secondary | ICD-10-CM | POA: Diagnosis not present

## 2017-04-23 DIAGNOSIS — I480 Paroxysmal atrial fibrillation: Secondary | ICD-10-CM | POA: Diagnosis not present

## 2017-04-23 DIAGNOSIS — E785 Hyperlipidemia, unspecified: Secondary | ICD-10-CM

## 2017-04-23 NOTE — Patient Instructions (Addendum)
-   continue ASA 81mg  and eliquis and lipitor for stroke prevention - continue PT/OT and self exercise - Follow up with your primary care physician for stroke risk factor modification. Recommend maintain blood pressure goal <130/80, diabetes with hemoglobin A1c goal below 7.0% and lipids with LDL cholesterol goal below 70 mg/dL.  - continue keppra for seizure control.  - check BP at home and avoid hypotension  - follow up with Dr. Letta Pate and Dr. Rayann Heman - follow up in 6 months

## 2017-04-23 NOTE — Progress Notes (Signed)
STROKE NEUROLOGY FOLLOW UP NOTE  NAME: Herbert Marquez DOB: 07/15/1927  REASON FOR VISIT: stroke follow up HISTORY FROM: pt and wife and chart  Today we had the pleasure of seeing Herbert Marquez in follow-up at our Neurology Clinic. Pt was accompanied by wife.   History Summary Herbert C Jonesis a 84 y.o.malewith history of DM, HTN, HLD, previous stroke in 06/2015 at b/l thalami and right mesial temporal lobe due to left PCA occlusion and right PCA high grade stenosis, afib on 11/28/2015 post right hip fracture surgery on eliquis but later discontinued at follow up clinic and put on DAPT, and CAD s/p PCI admitted on 03/30/16 for speechdifficulties and aphasia. Hereceived IV t-PA. MRI showed left temproal infarct with hemorrhagic conversion and SDH. CTA head and neck 60-70% proximal right ICA and 40-50% left ICA stenosis. EF 65-70% in 11/2015. LDL 38 and A1C 5.0. ASA started 7 days post stroke due to hemorrhagic conversion and eliquis started on 04/13/16. lipitor down from 80mg  to 40mg . He was discharged to CIR.   06/04/16 readmission - pt admitted for acute onset confusion and agitation. MRI punctate right hippocampal infarct likely acute, and old left temporal lobe hemorrhagic infarct. His spell on admission most suggestive of seizure was started on Keppra  at 500 mg twice daily. EEG moderatediffuse slowing. His eliquis and lipitor continued and discharged to CIR.  07/25/16 follow up (CM) - Patient returns to the stroke clinic today for follow-up he has not had further stroke TIA symptoms,  he remains on eliquis for secondary stroke prevention and atrial fibrillation. He has not had any further seizure activity and remains on Keppra 500 twice daily. He is on Lipitor for hyperlipidemia without complaints of muscle aches. His Seroquel controls his agitation according to the wife and he sleeps well at night. Appetite is reportedly good. His therapies have concluded. [facility] He has no new neurologic findings.  10/09/16 follow up - the patient has been doing well. He finished home PT/OT and now on outpt PT/OT. Still use wheelchair most time but able to go to bathroom on walker. Bp stable at home 110/70s. No bleeding while on eliquis. Still has mild right hemiparesis. BP today 107/68. Continued on keppra. seroquel works well with him.   01/07/17 admission -admitted for agitation and not able to speak.  Wife denies any staring spells or seizure-like activity.  He needed 1 mg Ativan and 10 mg Haldol for sedation but still quite agitated at that time in the ER.  CT head showed no bleeding.  CTA head and neck showed occlusion of right VA V3 segment, 55% stenosis of proximal right ICA.  MRI brain showed no acute infarct.  EF 55%.  EEG no seizure activity but moderate diffuse slowing.  LDL 62 and A1c 5.7.  Concerning the episode more likely seizure episode, less likely stroke.  Continued on aspirin 81 and an Eliquis as well as Lipitor.  Also increase Keppra to 750 twice daily.  Interval History During the interval time, patient has been doing well.  No recurrent stroke or seizure-like activity.  Continued on aspirin 81, Eliquis, Lipitor, and Keppra 750 twice daily without side effects.  BP today 129/79.  Patient continue follow-up with Dr. Read Drivers, and currently on outpatient PT/OT.  Came in with wheelchair, but able to walk with walker at home.  Seroquel at night for agitation and sundowning, wife stated that it helps a lot.  REVIEW OF SYSTEMS: Full 14 system review of systems performed and notable only for those listed below and in HPI above, all others are negative:  Constitutional:   Cardiovascular:  Ear/Nose/Throat:  Hearing loss Skin:  Eyes:   Respiratory:   Gastroitestinal:   Genitourinary:  Hematology/Lymphatic:   Endocrine: cold intolerance Musculoskeletal:  Joint pain Allergy/Immunology:   Neurological:   Psychiatric:  Sleep:   The following  represents the patient's updated allergies and side effects list: No Known Allergies  The neurologically relevant items on the patient's problem list were reviewed on today's visit.  Neurologic Examination  A problem focused neurological exam (12 or more points of the single system neurologic examination, vital signs counts as 1 point, cranial nerves count for 8 points) was performed.  Blood pressure 129/79, pulse 67.  General - Well nourished, well developed, in no apparent distress.  Ophthalmologic - Fundi not visualized due to eye movement.  Cardiovascular - irregularly irregular heart rate and rhythm.  Mental Status -  Level of arousal and orientation to time, place, and person were intact. Language including expression, naming, repetition, comprehension was assessed and found intact. Fund of Knowledge was assessed and was intact.  Cranial Nerves II - XII - II - Visual field intact OU. III, IV, VI - Extraocular movements intact. V - Facial sensation intact bilaterally. VII - Facial movement intact bilaterally. VIII - Hearing & vestibular intact bilaterally. X - Palate elevates symmetrically. XI - Chin turning & shoulder shrug intact bilaterally. XII - Tongue protrusion intact.  Motor Strength - The patient's strength was normal in LUE and LLE, however, RUE pronator drift and right hand mild dexterity difficulty, RLE 4/5.  Bulk was normal and fasciculations were absent.   Motor Tone - Muscle tone was assessed at the neck and appendages and was normal.  Reflexes - The patient's reflexes were 1+ in all extremities and he had no pathological reflexes.  Sensory - Light touch, temperature/pinprick were assessed and were normal.    Coordination - The patient had right FTN mild dysmetria.  Tremor was absent.  Gait and Station - not tested, in wheelchair.   Data reviewed: I personally reviewed the images and agree with the radiology interpretations.  Ct Head Code Stroke W/o  Cm 03/30/2016 1. No evidence of acute intracranial abnormality.  2. ASPECTS is 10.  3. Moderate chronic small vessel ischemic disease and chronic left thalamic infarct.   MRI Head  03/31/2016 Limited examination. Marked motion degrades image quality. No acute abnormality. But able to tell there is left temporal acute infarct with hemorrhagic transformation.  CTA Head and Neck 03/31/1016 1. Left temporal lobe hemorrhagic infarct.The parenchymal hematoma measures 4 cc volume.Subdural extension in the middle cranial fossawithout significant mass effect. 2. No emergent large vessel occlusion. 3. Atherosclerosis with 60-70% proximal right ICA and 40-50% proximal left ICA stenosis. Mild moderate right supraclinoid ICA stenosis. No significant stenosis in the posterior circulation. 4. Pulmonary fibrosis.  TTE 06/09/16 Left ventricle: The cavity size was normal. Systolic function was   normal. The estimated ejection fraction was in the range of 55%   to 60%. Wall motion was normal; there were no regional wall   motion abnormalities. Doppler parameters are consistent with   abnormal left ventricular relaxation (grade 1 diastolic   dysfunction). There was no evidence of elevated ventricular   filling pressure by Doppler parameters. - Aortic valve: There was no regurgitation. - Aortic root: The aortic root was normal in size. - Mitral valve: There  was trivial regurgitation. - Left atrium: The atrium was mildly dilated. - Right ventricle: Systolic function was normal. - Tricuspid valve: There was trivial regurgitation. - Pulmonary arteries: Systolic pressure was within the normal   range. - Inferior vena cava: The vessel was normal in size. - Pericardium, extracardiac: There was no pericardial effusion.  EEG 06/05/16 - This awake EEG is abnormal due to moderate diffuse slowing of the waking background.  CT head 04/12/16 1. Evolving hemorrhage of the left temporal lobe. 2. Persistent  surrounding edema without significant midline shift or mass effect. 3. No new hemorrhage. 4. Atrophy and small vessel disease.  MRI and MRA brain 06/07/16 MRI HEAD: 4 mm RIGHT hippocampal infarct is likely acute. Old LEFT temporal lobe hemorrhage. Moderate chronic small vessel ischemic disease. Old LEFT thalamus lacunar infarct. MRA HEAD: No emergent large vessel occlusion or severe stenosis.  Ct Angio Head and neck W Or Wo Contrast 01/07/2017 IMPRESSION: 1. 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 2. No intracranial arterial occlusion or high-grade stenosis. 3. 55% stenosis of the proximal right internal carotid artery. 4. Aortic Atherosclerosis (ICD10-I70.0) and Emphysema (ICD10-J43.9).   Dg Chest Port 1 View 01/07/2017 IMPRESSION: 1. Cardiomegaly with aortic atherosclerosis. 2. Subpleural interstitial changes consistent with pulmonary interstitial fibrosis. 3. No overt pulmonary edema nor acute pneumonic consolidations.   Ct Head Code Stroke Wo Contrast 01/07/2017 IMPRESSION: 1. Severely motion degraded examination, but no visible acute hemorrhage. 2. ASPECTS is not able to be reliably assessed because of the degree of motion. However, there is no large territory infarct or mass effect.   Mr Brain Wo Contrast 01/08/2017 IMPRESSION: Negative for acute infarct. Moderate atrophy and chronic microvascular ischemia.   TTE Impressions: - Normal LV size with EF 55%. Normal RV size and systolic function. No significant valvular abnormalities  EEG EEG is abnormal due to moderatediffuse slowing of the waking background.    Component     Latest Ref Rng & Units 03/31/2016 04/01/2016 06/04/2016 06/05/2016  Cholesterol     0 - 200 mg/dL 77     Triglycerides     <150 mg/dL 37     HDL Cholesterol     >40 mg/dL 32 (L)     Total CHOL/HDL Ratio     RATIO 2.4     VLDL     0 - 40 mg/dL 7     LDL (calc)     0 - 99 mg/dL 38      Hemoglobin A1C      5.0     Mean Plasma Glucose     mg/dL 97     TSH     0.350 - 4.500 uIU/mL  2.423 3.437   Vitamin B12     180 - 914 pg/mL  1,477 (H) 755 736  HIV Screen 4th Generation wRfx     Non Reactive    Non Reactive  Ammonia     9 - 35 umol/L    37 (H)   Component     Latest Ref Rng & Units 10/01/2016  Cholesterol     0 - 200 mg/dL   Triglycerides     <150 mg/dL   HDL Cholesterol     >40 mg/dL   Total CHOL/HDL Ratio     RATIO   VLDL     0 - 40 mg/dL   LDL (calc)     0 - 99 mg/dL   Hemoglobin A1C  5.6  Mean Plasma Glucose     mg/dL   TSH     0.350 - 4.500 uIU/mL   Vitamin B12     180 - 914 pg/mL   HIV Screen 4th Generation wRfx     Non Reactive   Ammonia     9 - 35 umol/L    Component     Latest Ref Rng & Units 01/07/2017 01/08/2017 04/02/2017  Cholesterol     0 - 200 mg/dL 115    Triglycerides     0 - 149 mg/dL 77  80  HDL Cholesterol     >39 mg/dL 38 (L)  37 (L)  Total CHOL/HDL Ratio     0.0 - 5.0 ratio 3.0  3.0  VLDL     0 - 40 mg/dL 15    LDL (calc)     0 - 99 mg/dL 62  57  Cholesterol, Total     100 - 199 mg/dL   110  VLDL Cholesterol Cal     5 - 40 mg/dL   16  Hemoglobin A1C     4.8 - 5.6 % 5.7 (H)  5.7 (H)  Mean Plasma Glucose     mg/dL 116.89    Est. average glucose Bld gHb Est-mCnc     mg/dL   117  Levetiracetam, S     10.0 - 40.0 ug/mL  20.7   TSH     0.450 - 4.500 uIU/mL   4.890 (H)  T4,Free(Direct)     0.82 - 1.77 ng/dL   0.77 (L)  Triiodothyronine,Free,Serum     2.0 - 4.4 pg/mL   2.3    Assessment: As you may recall, he is a 84 y.o. Caucasian male with PMH of DM, HTN, HLD, previous stroke in 06/2015 at b/l thalami and right mesial temporal lobe due to left PCA occlusion and right PCA high grade stenosis, afib on 11/28/2015 post right hip fracture surgery on eliquis but later discontinued at follow up clinic and put on DAPT, and CAD s/p PCI admitted on 03/30/16 for speechdifficulties and aphasia. Hereceived IV t-PA. MRI  showed left temproal infarct with hemorrhagic conversion and SDH. CTA head and neck 60-70% proximal right ICA and 40-50% left ICA stenosis. EF 65-70% in 11/2015. LDL 38 and A1C 5.0. ASA started 7 days post stroke due to hemorrhagic conversion and eliquis started on 04/13/16. lipitor down from 80mg  to 40mg . He was discharged to CIR. Pt re-admitted on 06/04/16 for acute onset confusion and agitation. MRI punctate right hippocampal infarct likely acute, and old left temporal lobe hemorrhagic infarct. His spell on admission most suggestive of seizure was started on Keppra  at 500 mg twice daily. EEG moderatediffuse slowing. His eliquis and lipitor continued and discharged to CIR. He now finished home PT/OT and now on outpt PT/OT. Still use wheelchair most time but able to go to bathroom on walker. Still has mild right hemiparesis. Continued on keppra. seroquel works well with him. His stroke in 06/2016 felt to be incidental and at right PCA territory and likely due to chronic right PCA high grade stenosis, not at right ICA territory, no need right ICA intervention at this time. Added ASA 81mg  on top of eliquis. Admitted on 01/07/17 for agitation and not able to speak. CTA head and neck showed occlusion of right VA V3 segment, 55% stenosis of proximal right ICA.  MRI brain showed no acute infarct.  EF 55%.  EEG no seizure activity but moderate diffuse slowing.  LDL 62 and A1c 5.7.  Concerning the episode more likely seizure episode, less likely stroke.  Continued on aspirin 81 and an Eliquis as well as Lipitor, but increased Keppra to 750 twice daily. Currently, pt doing well. On outpt PT/OT, following with Dr. Letta Pate.   Plan:  - continue ASA 81mg  and eliquis and lipitor for stroke prevention - continue PT/OT and self exercise - Follow up with your primary care physician for stroke risk factor modification. Recommend maintain blood pressure goal <130/80, diabetes with hemoglobin A1c goal below 7.0% and lipids with LDL  cholesterol goal below 70 mg/dL.  - continue keppra for seizure control.  - check BP at home and avoid hypotension  - follow up with Dr. Letta Pate and Dr. Rayann Heman - follow up in 6 months  I spent more than 25 minutes of face to face time with the patient. Greater than 50% of time was spent in counseling and coordination of care. We discussed about avoiding falls, continue current medications, and continue PT/OT.    No orders of the defined types were placed in this encounter.   No orders of the defined types were placed in this encounter.   Patient Instructions  - continue ASA 81mg  and eliquis and lipitor for stroke prevention - continue PT/OT and self exercise - Follow up with your primary care physician for stroke risk factor modification. Recommend maintain blood pressure goal <130/80, diabetes with hemoglobin A1c goal below 7.0% and lipids with LDL cholesterol goal below 70 mg/dL.  - continue keppra for seizure control.  - check BP at home and avoid hypotension  - follow up with Dr. Letta Pate and Dr. Rayann Heman - follow up in 6 months   Rosalin Hawking, MD PhD [facility] [address], [place], [place] [address] [phone]

## 2017-04-24 ENCOUNTER — Ambulatory Visit: Payer: Self-pay | Admitting: Neurology

## 2017-04-26 ENCOUNTER — Ambulatory Visit: Payer: Medicare Other | Admitting: Rehabilitative and Restorative Service Providers"

## 2017-04-26 ENCOUNTER — Encounter: Payer: Self-pay | Admitting: Rehabilitative and Restorative Service Providers"

## 2017-04-26 DIAGNOSIS — R2681 Unsteadiness on feet: Secondary | ICD-10-CM | POA: Diagnosis not present

## 2017-04-26 DIAGNOSIS — R2689 Other abnormalities of gait and mobility: Secondary | ICD-10-CM | POA: Diagnosis not present

## 2017-04-26 DIAGNOSIS — M6281 Muscle weakness (generalized): Secondary | ICD-10-CM | POA: Diagnosis not present

## 2017-04-26 NOTE — Therapy (Signed)
[facility] [address] [place], [place], [address] Phone: [phone]   Fax:  [phone]  Physical Therapy Treatment  Patient Details  Name: Herbert Marquez MRN: [id number] Date of Birth: 1927/10/08 Referring Provider: Charlett Blake, MD   Encounter Date: 04/26/2017  PT End of Session - 04/26/17 1027    Visit Number  2    Number of Visits  17    Date for PT Re-Evaluation  06/15/17    Authorization Type  [employer] medicare    Authorization Time Period  04/16/17 to 06/15/17    PT Start Time  1020    PT Stop Time  1100    PT Time Calculation (min)  40 min    Activity Tolerance  Patient tolerated treatment well    Behavior During Therapy  [facility] for tasks assessed/performed       Past Medical History:  Diagnosis Date  . Arthritis    "pretty much all over"   . Basal cell carcinoma    "several burned off his body, face, head"  . BPH (benign prostatic hypertrophy)   . Coronary artery disease    a. s/p PCI of RCA in 2006  . CVA (cerebral infarction)    a. 06/2015: left thalamic and bilateral PCA  . GERD (gastroesophageal reflux disease)   . Hyperlipidemia   . Hypertension   . Presence of permanent cardiac pacemaker   . Seizures ([place])   . Stroke ([place])   . TIA (transient ischemic attack)    Approximately 6 weeks post-cardiac catheterization.     Past Surgical History:  Procedure Laterality Date  . CARDIOVASCULAR STRESS TEST  07/01/2007   EF 74%  . CATARACT EXTRACTION, BILATERAL    . CORONARY ANGIOPLASTY WITH STENT PLACEMENT  10/2004   stenting x 2 to RCA  . FEMUR IM NAIL Right 11/26/2015   Procedure: INTRAMEDULLARY RIGHT (IM) NAIL FEMORAL;  Surgeon: Rod Can, MD;  Location: [facility];  Service: Orthopedics;  Laterality: Right;  . FRACTURE SURGERY    . HERNIA REPAIR    . HIP ARTHROPLASTY  03/09/2011   Procedure: ARTHROPLASTY BIPOLAR HIP;  Surgeon: Mauri Pole;  Location: [facility];  Service: Orthopedics;   Laterality: Left;  . INSERT / REPLACE / REMOVE PACEMAKER  10/23/2016  . LAPAROSCOPIC INCISIONAL / UMBILICAL / VENTRAL HERNIA REPAIR     "below his naval"  . PACEMAKER IMPLANT N/A 10/23/2016   Procedure: Pacemaker Implant;  Surgeon: Thompson Grayer, MD;  Location: [facility];  Service: Cardiovascular;  Laterality: N/A;    There were no vitals filed for this visit.  Subjective Assessment - 04/26/17 1022    Subjective  The patient reports he did not sleep well last night and woke up with a nervous feeling in his stomach and not feeling well.  He is doing exercises at home, but did not walk yesterday due to not feeling well.      Patient is accompained by:  Family member    Pertinent History  L THR, R hip fracture, h/o multiple CVAs, vascular dementia.    Patient Stated Goals  Want to be walking better. Walking more in the home (not using wheelchair).     Currently in Pain?  No/denies         [facility] PT Assessment - 04/26/17 1030      Ambulation/Gait   Ambulation/Gait  Yes    Ambulation/Gait Assistance  4: Min guard    Ambulation Distance (Feet)  75 Feet x 2 reps    Assistive device  Rolling walker    Gait Pattern  Step-through pattern;Decreased stance time - right;Decreased stride length;Decreased dorsiflexion - right R knee maintains some flexion with walking/ uses knee brace    Gait velocity  0.63 ft/sec (32.8/51.91)      Standardized Balance Assessment   Standardized Balance Assessment  Berg Balance Test;Timed Up and Go Test      Berg Balance Test   Sit to Stand  Able to stand using hands after several tries    Standing Unsupported  Able to stand 30 seconds unsupported    Sitting with Back Unsupported but Feet Supported on Floor or Stool  Able to sit safely and securely 2 minutes    Stand to Sit  Controls descent by using hands    Transfers  Able to transfer with verbal cueing and /or supervision    Standing Unsupported with Eyes Closed  Able to stand 10 seconds with  supervision    Standing Ubsupported with Feet Together  Needs help to attain position but able to stand for 30 seconds with feet together    From Standing, Reach Forward with Outstretched Arm  Reaches forward but needs supervision    From Standing Position, Pick up Object from Floor  Unable to try/needs assist to keep balance    From Standing Position, Turn to Look Behind Over each Shoulder  Needs assist to keep from losing balance and falling    Turn 360 Degrees  Needs assistance while turning    Standing Unsupported, Alternately Place Feet on Step/Stool  Needs assistance to keep from falling or unable to try    Standing Unsupported, One Foot in [employer] balance while stepping or standing    Standing on One Leg  Unable to try or needs assist to prevent fall    Total Score  18    Berg comment:  18/56      Timed Up and Go Test   TUG  -- 50.28 seconds TUG with RW    TUG Comments  Provided close supervision to CGA during testing.                  [facility] Adult PT Treatment/Exercise - 04/26/17 1030      Neuro Re-ed    Neuro Re-ed Details   Tried to perform standing hip abduction at countertop.  Patient was able to lift R LE into hip abduction x 10, but does not have confidence in R LE to hold him in stance while he abducts the left side.  Therefore, recommended currently continue with home health HEP and added wall bumps with walker anteriorly to work on weight shift/hip strategy dec'ing UE supprt in standing.             PT Education - 04/26/17 1140    Education provided  Yes    Education Details  HEP: wall bumps with walker nearby.    Person(s) Educated  Patient;Spouse    Methods  Explanation;Demonstration;Handout    Comprehension  Verbalized understanding;Returned demonstration       PT Short Term Goals - 04/26/17 1058      PT SHORT TERM GOAL #1   Title  The patient will perform HEP with assist from wife as needed for LE strengthening, standing balance and  general mobility.  TARGET DATE FOR ALL STGs:  05/16/16    Time  4    Period  Weeks    Status  New  PT SHORT TERM GOAL #2   Title  Patient will complete Berg Balance Assessment with goal to be set as appropriate.     Baseline  Scored 18/56 on 04/26/17    Time  1    Period  Weeks    Status  Achieved      PT SHORT TERM GOAL #3   Title  Patient will complete gait velocity assessment with goal to be set as appropriate.     Baseline  04/26/17: 0.63 ft/sec with RW.    Time  1    Period  Weeks    Status  Achieved      PT SHORT TERM GOAL #4   Title  Patient will improve 5x sit to stand to <=17 sec (with use of hands) to demonstrate improved LE strength and balance.     Time  4    Period  Weeks    Status  New        PT Long Term Goals - 04/16/17 1709      PT LONG TERM GOAL #1   Title  The patient will perform updated HEP with assist from spouse. (Target for LTGs 06/15/17)    Time  8    Period  Weeks    Status  New    Target Date  06/15/17      PT LONG TERM GOAL #2   Title  Patient will ambulate 300 ft without seated rest break with supervision to improve his ability and confidence to ambulate in his home.    Time  8    Period  Weeks    Status  New      PT LONG TERM GOAL #3   Title  Patient will improve gait velocity to ________ to demonstrate decreasing fall risk. (goal to be set based on level at 4 week/STG assessment)    Time  8    Period  Weeks    Status  New      PT LONG TERM GOAL #4   Title  Patient will improve Berg score to ________ to demonstrate decreasing fall risk. (goal to be set based on level at 4 week/STG assessment)    Time  8    Period  Weeks    Status  New      PT LONG TERM GOAL #5   Title  Patient will improve 5x sit to stand to <=15 seconds (with use of UEs) to demonstrate improved LE strength and balance.    Time  8    Period  Weeks    Status  New            Plan - 04/26/17 1142    Clinical Impression Statement  The patient is at risk  for falls per Berg, gait speed, and TUG today.  The patient initially reported not feeling well today, but tolerated treatment well.  He could not safely perform standing ther ex near countertop due to dec'd right weight shift.  Will progress as able.     PT Treatment/Interventions  ADLs/Self Care Home Management;Gait training;DME Instruction;Functional mobility training;Therapeutic activities;Therapeutic exercise;Balance training;Neuromuscular re-education;Cognitive remediation;Patient/family education;Orthotic Fit/Training    PT Next Visit Plan  Add standing ther ex.  Checked 2 STGs for Berg and Gait speed on 2/22 appt.  Gait tolerance and standing balance.    Consulted and Agree with Plan of Care  Patient;Family member/caregiver    Family Member Consulted  spouse       Patient will benefit from skilled  therapeutic intervention in order to improve the following deficits and impairments:  Abnormal gait, Decreased activity tolerance, Decreased balance, Decreased cognition, Decreased mobility, Decreased range of motion, Difficulty walking, Impaired sensation, Postural dysfunction, Pain  Visit Diagnosis: Other abnormalities of gait and mobility  Muscle weakness (generalized)     Problem List Patient Active Problem List   Diagnosis Date Noted  . TIA (transient ischemic attack)   . Presence of permanent cardiac pacemaker   . Hypertension   . Hyperlipidemia   . Coronary artery disease   . Basal cell carcinoma   . Arthritis   . Pressure injury of skin 01/08/2017  . Stroke ([place]) 01/07/2017  . Ischemic stroke ([place])   . Second degree Mobitz II AV block 10/23/2016  . Seizures ([place]) 10/09/2016  . Healthcare maintenance 10/01/2016  . Restlessness and agitation 07/02/2016  . Vascular dementia with behavior disturbance 06/29/2016  . Hemiparesis and other late effects of cerebrovascular accident ([place]) 06/29/2016  . Embolic stroke ([place]) 97/58/8325  . Right hemiparesis ([place])   . History of CVA  with residual deficit   . Chronic anticoagulation   . Atrial fibrillation with rapid ventricular response ([place])   . Seizure prophylaxis   . Diastolic dysfunction   . Bacteremia due to Gram-positive bacteria   . Altered mental status   . Dementia without behavioral disturbance   . Leukocytosis   . Acute blood loss anemia   . Acute encephalopathy 06/04/2016  . PAF (paroxysmal atrial fibrillation) ([place])   . Iron deficiency anemia 04/04/2016  . History of stroke 04/04/2016  . Cerebral hemorrhage (HCC) w/ SDH s/p IV tPA   . Coronary artery disease involving native coronary artery of native heart without angina pectoris   . Orthostatic hypotension   . History of right hip replacement   . Acute ischemic stroke ([place]) - L temporal lobe s/p tPA 03/30/2016  . BPH (benign prostatic hyperplasia) 11/25/2015  . GERD (gastroesophageal reflux disease) 11/25/2015  . CKD (chronic kidney disease), stage II 11/25/2015  . Overactive bladder   . Cerebrovascular accident (CVA) ([place]) 09/18/2015  . Gait disturbance, post-stroke 06/23/2015  . 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 Thalamic infarction ([place]) 06/21/2015  . Benign essential HTN   . Type 2 diabetes mellitus with complication, without long-term current use of insulin ([place])   . Dysphagia as late effect of cerebrovascular disease   . Hyponatremia   . HLD (hyperlipidemia)   . COPD (chronic obstructive pulmonary disease) ([place]) 03/09/2011    Railee Bonillas, PT 04/26/2017, 11:44 AM  [facility] [address] [place], [place], [address] Phone: [phone]   Fax:  [phone]  Name: CANDY ZIEGLER MRN: [id number] Date of Birth: 02/28/1928

## 2017-04-26 NOTE — Patient Instructions (Signed)
Weight Shift: Anterior / Posterior (Righting / Equilibrium)    Begin leaning with your hips supported on wall or countertop with your walker in front of you. Move your hips away from the wall or counter and stand tall holding 5 seconds. Then return to rest against the wall/countertop.  Repeat __10__ times per session. Do __1__ sessions per day.  Copyright  VHI. All rights reserved.

## 2017-04-29 ENCOUNTER — Telehealth: Payer: Self-pay | Admitting: *Deleted

## 2017-04-29 ENCOUNTER — Encounter: Payer: Self-pay | Admitting: Physical Therapy

## 2017-04-29 ENCOUNTER — Ambulatory Visit: Payer: Medicare Other | Admitting: Physical Therapy

## 2017-04-29 DIAGNOSIS — M6281 Muscle weakness (generalized): Secondary | ICD-10-CM | POA: Diagnosis not present

## 2017-04-29 DIAGNOSIS — R2681 Unsteadiness on feet: Secondary | ICD-10-CM | POA: Diagnosis not present

## 2017-04-29 DIAGNOSIS — R2689 Other abnormalities of gait and mobility: Secondary | ICD-10-CM

## 2017-04-29 MED ORDER — APIXABAN 5 MG PO TABS: 5.0000 mg | ORAL_TABLET | Freq: Two times a day (BID) | ORAL | 11 refills | Status: DC

## 2017-04-29 NOTE — Telephone Encounter (Signed)
Eliquis 5mg  paper refill request received; pt is 84 yrs old wt-73.5kg, Crea-1.09 on 04/02/17, last seen by Dr. Rayann Heman on 04/18/17. Will send in refill to request pharmacy. After sending in the refill, noted the comments to resume was not removed, therefore spoke with Pharmacist at [facility] she stated she would remove it on her end as she understood it the comment was not needed & she would process the refill.

## 2017-04-29 NOTE — Patient Instructions (Signed)
Hip Side Kick    Holding on to the counter for balance and toes pointed forward. Lift a leg out to side, keeping knee straight. Do not lean. Repeat using other leg. Repeat __10__ times. Rest. And do 10 more. Do __1__ sessions per day.  [email or web address].exer.us/342   Copyright  VHI. All rights reserved.

## 2017-04-29 NOTE — Therapy (Signed)
[facility] [address] [place], [place], [address] Phone: [phone]   Fax:  [phone]  Physical Therapy Treatment  Patient Details  Name: Herbert Marquez MRN: [id number] Date of Birth: November 23, 1927 Referring Provider: Charlett Blake, MD   Encounter Date: 04/29/2017  PT End of Session - 04/29/17 2133    Visit Number  3    Number of Visits  17    Date for PT Re-Evaluation  06/15/17    Authorization Type  [employer] medicare    Authorization Time Period  04/16/17 to 06/15/17    PT Start Time  1315    PT Stop Time  1400    PT Time Calculation (min)  45 min    Equipment Utilized During Treatment  Gait belt    Activity Tolerance  Patient tolerated treatment well    Behavior During Therapy  [facility] for tasks assessed/performed       Past Medical History:  Diagnosis Date  . Arthritis    "pretty much all over"   . Basal cell carcinoma    "several burned off his body, face, head"  . BPH (benign prostatic hypertrophy)   . Coronary artery disease    a. s/p PCI of RCA in 2006  . CVA (cerebral infarction)    a. 06/2015: left thalamic and bilateral PCA  . GERD (gastroesophageal reflux disease)   . Hyperlipidemia   . Hypertension   . Presence of permanent cardiac pacemaker   . Seizures ([place])   . Stroke ([place])   . TIA (transient ischemic attack)    Approximately 6 weeks post-cardiac catheterization.     Past Surgical History:  Procedure Laterality Date  . CARDIOVASCULAR STRESS TEST  07/01/2007   EF 74%  . CATARACT EXTRACTION, BILATERAL    . CORONARY ANGIOPLASTY WITH STENT PLACEMENT  10/2004   stenting x 2 to RCA  . FEMUR IM NAIL Right 11/26/2015   Procedure: INTRAMEDULLARY RIGHT (IM) NAIL FEMORAL;  Surgeon: Rod Can, MD;  Location: [facility];  Service: Orthopedics;  Laterality: Right;  . FRACTURE SURGERY    . HERNIA REPAIR    . HIP ARTHROPLASTY  03/09/2011   Procedure: ARTHROPLASTY BIPOLAR HIP;  Surgeon: Mauri Pole;  [facility]: [facility];  Service: Orthopedics;  Laterality: Left;  . INSERT / REPLACE / REMOVE PACEMAKER  10/23/2016  . LAPAROSCOPIC INCISIONAL / UMBILICAL / VENTRAL HERNIA REPAIR     "below his naval"  . PACEMAKER IMPLANT N/A 10/23/2016   Procedure: Pacemaker Implant;  Surgeon: Thompson Grayer, MD;  Location: [facility];  Service: Cardiovascular;  Laterality: N/A;    There were no vitals filed for this visit.  Subjective Assessment - 04/29/17 1318    Subjective  No changes or questions.     Patient is accompained by:  Family member    Pertinent History  L THR, R hip fracture, h/o multiple CVAs, vascular dementia.    Patient Stated Goals  Want to be walking better. Walking more in the home (not using wheelchair).     Currently in Pain?  No/denies                      [facility] Adult PT Treatment/Exercise - 04/29/17 2123      Transfers   Transfers  Sit to Stand;Stand to Sit    Sit to Stand  4: Min guard;With upper extremity assist    Sit to Stand Details (indicate cue type and reason)  use of armrests--to RW vs to counter/sink    Stand to Sit  5: Supervision    Stand to Sit Details  vc for controlled descent    Number of Reps  -- throughout session      Ambulation/Gait   Ambulation/Gait  Yes    Ambulation/Gait Assistance  4: Min guard    Ambulation Distance (Feet)  40 Feet    Assistive device  Rolling walker    Gait Pattern  Step-through pattern;Decreased stance time - right;Decreased stride length;Decreased dorsiflexion - right    Ambulation Surface  Indoor    Gait Comments  vc for maximizing RLE extension and weight bearing      Exercises   Exercises  Knee/Hip      Knee/Hip Exercises: Standing   Knee Flexion  Strengthening;Both;20 reps alternating; mod cues for rt foot flat, rt knee extension    Hip Flexion  Stengthening;Both;20 reps;Knee bent    Hip Abduction  Stengthening;Both;1 set;20 reps bil UE support on counter    Hip Extension  Stengthening;Both;1  set;20 reps      Seated rest breaks between each exercise due to fatigue. SaO2 94% with HRmax 130 bpm.     Balance Exercises - 04/29/17 2127      Balance Exercises: Standing   Standing Eyes Opened  Wide (BOA);Solid surface;30 secs    Standing Eyes Closed  Wide (BOA);Solid surface;10 secs    Wall Bumps  Hip    Wall Bumps-Hips  Eyes opened;Anterior/posterior;15 reps RW in front; heel raise to wall bump; progressed to no UE     Sidestepping  Upper extremity support at counter        PT Education - 04/29/17 2132    Education provided  Yes    Education Details  addition to HEP; reviewed addition to HEP from last session with mod cues for technique    Person(s) Educated  Patient;Spouse    Methods  Explanation;Demonstration;Handout    Comprehension  Verbalized understanding;Returned demonstration;Need further instruction       PT Short Term Goals - 04/26/17 1058      PT SHORT TERM GOAL #1   Title  The patient will perform HEP with assist from wife as needed for LE strengthening, standing balance and general mobility.  TARGET DATE FOR ALL STGs:  05/16/16    Time  4    Period  Weeks    Status  New      PT SHORT TERM GOAL #2   Title  Patient will complete Berg Balance Assessment with goal to be set as appropriate.     Baseline  Scored 18/56 on 04/26/17    Time  1    Period  Weeks    Status  Achieved      PT SHORT TERM GOAL #3   Title  Patient will complete gait velocity assessment with goal to be set as appropriate.     Baseline  04/26/17: 0.63 ft/sec with RW.    Time  1    Period  Weeks    Status  Achieved      PT SHORT TERM GOAL #4   Title  Patient will improve 5x sit to stand to <=17 sec (with use of hands) to demonstrate improved LE strength and balance.     Time  4    Period  Weeks    Status  New        PT Long Term Goals - 04/16/17 1709      PT LONG  TERM GOAL #1   Title  The patient will perform updated HEP with assist from spouse. (Target for LTGs 06/15/17)     Time  8    Period  Weeks    Status  New    Target Date  06/15/17      PT LONG TERM GOAL #2   Title  Patient will ambulate 300 ft without seated rest break with supervision to improve his ability and confidence to ambulate in his home.    Time  8    Period  Weeks    Status  New      PT LONG TERM GOAL #3   Title  Patient will improve gait velocity to ________ to demonstrate decreasing fall risk. (goal to be set based on level at 4 week/STG assessment)    Time  8    Period  Weeks    Status  New      PT LONG TERM GOAL #4   Title  Patient will improve Berg score to ________ to demonstrate decreasing fall risk. (goal to be set based on level at 4 week/STG assessment)    Time  8    Period  Weeks    Status  New      PT LONG TERM GOAL #5   Title  Patient will improve 5x sit to stand to <=15 seconds (with use of UEs) to demonstrate improved LE strength and balance.    Time  8    Period  Weeks    Status  New            Plan - 04/29/17 2134    Clinical Impression Statement  Focused on review and update of HEP, gait training, balance and strenght training. Patient required encouragement to attempt standing exercises requiring him to stand on RLE while lifting/moving LLE. Pt overusing bil UEs for support and tends to raise up on toes of Rt foot when lifting LLE. Max vc to correct this technique. Patient can continue to benefit from PT    Rehab Potential  Good    Clinical Impairments Affecting Rehab Potential  rt knee pain    PT Frequency  2x / week    PT Duration  8 weeks    PT Treatment/Interventions  ADLs/Self Care Home Management;Gait training;DME Instruction;Functional mobility training;Therapeutic activities;Therapeutic exercise;Balance training;Neuromuscular re-education;Cognitive remediation;Patient/family education;Orthotic Fit/Training    PT Next Visit Plan Watch max HR; Check standing hip abduciton added to HEP at last session; add sit to stand to HEP; Gait tolerance; standing  balance; standing hip exercises (only given hip abduction for home due w/c positioned right behind him when he stands at sink)     PT Home Exercise Plan  wall bumps, standing hip abdct,     Consulted and Agree with Plan of Care  Patient;Family member/caregiver    Family Member Consulted  spouse       Patient will benefit from skilled therapeutic intervention in order to improve the following deficits and impairments:  Abnormal gait, Decreased activity tolerance, Decreased balance, Decreased cognition, Decreased mobility, Decreased range of motion, Difficulty walking, Impaired sensation, Postural dysfunction, Pain  Visit Diagnosis: Other abnormalities of gait and mobility  Muscle weakness (generalized)  Unsteadiness on feet     Problem List Patient Active Problem List   Diagnosis Date Noted  . TIA (transient ischemic attack)   . Presence of permanent cardiac pacemaker   . Hypertension   . Hyperlipidemia   . Coronary artery disease   . Basal  cell carcinoma   . Arthritis   . Pressure injury of skin 01/08/2017  . Stroke ([place]) 01/07/2017  . Ischemic stroke ([place])   . Second degree Mobitz II AV block 10/23/2016  . Seizures ([place]) 10/09/2016  . Healthcare maintenance 10/01/2016  . Restlessness and agitation 07/02/2016  . Vascular dementia with behavior disturbance 06/29/2016  . Hemiparesis and other late effects of cerebrovascular accident ([place]) 06/29/2016  . Embolic stroke ([place]) 03/50/0938  . Right hemiparesis ([place])   . History of CVA with residual deficit   . Chronic anticoagulation   . Atrial fibrillation with rapid ventricular response ([place])   . Seizure prophylaxis   . Diastolic dysfunction   . Bacteremia due to Gram-positive bacteria   . Altered mental status   . Dementia without behavioral disturbance   . Leukocytosis   . Acute blood loss anemia   . Acute encephalopathy 06/04/2016  . PAF (paroxysmal atrial fibrillation) ([place])   . Iron deficiency anemia 04/04/2016  .  History of stroke 04/04/2016  . Cerebral hemorrhage (HCC) w/ SDH s/p IV tPA   . Coronary artery disease involving native coronary artery of native heart without angina pectoris   . Orthostatic hypotension   . History of right hip replacement   . Acute ischemic stroke ([place]) - L temporal lobe s/p tPA 03/30/2016  . BPH (benign prostatic hyperplasia) 11/25/2015  . GERD (gastroesophageal reflux disease) 11/25/2015  . CKD (chronic kidney disease), stage II 11/25/2015  . Overactive bladder   . Cerebrovascular accident (CVA) ([place]) 09/18/2015  . Gait disturbance, post-stroke 06/23/2015  . 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 Thalamic infarction ([place]) 06/21/2015  . Benign essential HTN   . Type 2 diabetes mellitus with complication, without long-term current use of insulin ([place])   . Dysphagia as late effect of cerebrovascular disease   . Hyponatremia   . HLD (hyperlipidemia)   . COPD (chronic obstructive pulmonary disease) ([place]) 03/09/2011    Rexanne Mano, PT 04/29/2017, 9:42 PM  [facility] [address] [place], [place], [address] Phone: [phone]   Fax:  [phone]  Name: Herbert Marquez MRN: [id number] Date of Birth: 1927/03/20

## 2017-05-03 ENCOUNTER — Encounter: Payer: Self-pay | Admitting: Physical Therapy

## 2017-05-03 ENCOUNTER — Ambulatory Visit: Payer: Medicare Other | Attending: Physical Medicine & Rehabilitation | Admitting: Physical Therapy

## 2017-05-03 DIAGNOSIS — M6281 Muscle weakness (generalized): Secondary | ICD-10-CM | POA: Insufficient documentation

## 2017-05-03 DIAGNOSIS — R2681 Unsteadiness on feet: Secondary | ICD-10-CM | POA: Insufficient documentation

## 2017-05-03 DIAGNOSIS — R2689 Other abnormalities of gait and mobility: Secondary | ICD-10-CM

## 2017-05-03 NOTE — Patient Instructions (Addendum)
Stop doing the standing hip abduction exercise at the sink. Instead do these:  ABDUCTION: Sitting - Resistance Band (Active)  buter  Sit with feet flat. Lift right leg slightly and, against resistance band, draw it out to side. Go slowly! Complete _2__ sets of __10_ repetitions. Perform __1_ sessions per day.  Copyright  VHI. All rights reserved.    Butterfly, Supine    Sit on edge of seat. Band around both thighs, feet together. Spread knees apart. Hold _5__ seconds. Repeat __10_ times per session. Do __1_ sessions per day.  Copyright  VHI. All rights reserved.

## 2017-05-04 NOTE — Therapy (Signed)
[facility] [address] [place], [place], [address] Phone: [phone]   Fax:  [phone]  Physical Therapy Treatment  Patient Details  Name: Herbert Marquez MRN: [id number] Date of Birth: 1927/03/17 Referring Provider: Charlett Blake, MD   Encounter Date: 05/03/2017  PT End of Session - 05/04/17 0725    Visit Number  4    Number of Visits  17    Date for PT Re-Evaluation  06/15/17    Authorization Type  [employer] medicare    Authorization Time Period  04/16/17 to 06/15/17    PT Start Time  1115    PT Stop Time  1157    PT Time Calculation (min)  42 min    Equipment Utilized During Treatment  Gait belt    Activity Tolerance  Patient tolerated treatment well    Behavior During Therapy  [facility] for tasks assessed/performed       Past Medical History:  Diagnosis Date  . Arthritis    "pretty much all over"   . Basal cell carcinoma    "several burned off his body, face, head"  . BPH (benign prostatic hypertrophy)   . Coronary artery disease    a. s/p PCI of RCA in 2006  . CVA (cerebral infarction)    a. 06/2015: left thalamic and bilateral PCA  . GERD (gastroesophageal reflux disease)   . Hyperlipidemia   . Hypertension   . Presence of permanent cardiac pacemaker   . Seizures ([place])   . Stroke ([place])   . TIA (transient ischemic attack)    Approximately 6 weeks post-cardiac catheterization.     Past Surgical History:  Procedure Laterality Date  . CARDIOVASCULAR STRESS TEST  07/01/2007   EF 74%  . CATARACT EXTRACTION, BILATERAL    . CORONARY ANGIOPLASTY WITH STENT PLACEMENT  10/2004   stenting x 2 to RCA  . FEMUR IM NAIL Right 11/26/2015   Procedure: INTRAMEDULLARY RIGHT (IM) NAIL FEMORAL;  Surgeon: Rod Can, MD;  Location: [facility];  Service: Orthopedics;  Laterality: Right;  . FRACTURE SURGERY    . HERNIA REPAIR    . HIP ARTHROPLASTY  03/09/2011   Procedure: ARTHROPLASTY BIPOLAR HIP;  Surgeon: Mauri Pole;  Location: [facility];  Service: Orthopedics;  Laterality: Left;  . INSERT / REPLACE / REMOVE PACEMAKER  10/23/2016  . LAPAROSCOPIC INCISIONAL / UMBILICAL / VENTRAL HERNIA REPAIR     "below his naval"  . PACEMAKER IMPLANT N/A 10/23/2016   Procedure: Pacemaker Implant;  Surgeon: Thompson Grayer, MD;  Location: [facility];  Service: Cardiovascular;  Laterality: N/A;    There were no vitals filed for this visit.  Subjective Assessment - 05/03/17 1210    Subjective  No changes or questions. Reports he is still only walking every other day at home.    Patient is accompained by:  Family member    Pertinent History  L THR, R hip fracture, h/o multiple CVAs, vascular dementia.    Patient Stated Goals  Want to be walking better. Walking more in the home (not using wheelchair).     Currently in Pain?  No/denies                      [facility] Adult PT Treatment/Exercise - 05/03/17 1700      Transfers   Transfers  Sit to Stand;Stand to Sit    Sit to Stand  4: Min guard;With upper extremity assist;From chair/3-in-1  Stand to Sit  5: Supervision      Ambulation/Gait   Ambulation/Gait  Yes    Ambulation/Gait Assistance  4: Min guard    Ambulation Distance (Feet)  60 Feet    Assistive device  Rolling walker    Gait Pattern  Step-through pattern;Decreased stance time - right;Decreased stride length;Decreased dorsiflexion - right;Right flexed knee in stance;Trunk flexed    Ambulation Surface  Indoor      Knee/Hip Exercises: Standing   Hip Abduction  Stengthening;Both;2 sets;10 reps alternating; assist for erect trunk and less UE support    Abduction Limitations  with more upright posture and less UE support, rt knee buckled x 2      Knee/Hip Exercises: Seated   Clamshell with TheraBand  Green    Abduction/Adduction   Strengthening;Both;2 sets;10 reps    Abd/Adduction Limitations  green band; stepping out and in 1 leg at a time      Knee/Hip Exercises: Sidelying   Clams  5  reps green band; 10 reps no band; bil             PT Education - 05/04/17 0738    Education provided  Yes    Education Details  addition to HEP; need to be walking EVERY day at home    Person(s) Educated  Patient;Spouse    Methods  Explanation;Demonstration;Tactile cues;Verbal cues;Handout    Comprehension  Verbalized understanding;Returned demonstration;Verbal cues required;Tactile cues required;Need further instruction       PT Short Term Goals - 04/26/17 1058      PT SHORT TERM GOAL #1   Title  The patient will perform HEP with assist from wife as needed for LE strengthening, standing balance and general mobility.  TARGET DATE FOR ALL STGs:  05/16/16    Time  4    Period  Weeks    Status  New      PT SHORT TERM GOAL #2   Title  Patient will complete Berg Balance Assessment with goal to be set as appropriate.     Baseline  Scored 18/56 on 04/26/17    Time  1    Period  Weeks    Status  Achieved      PT SHORT TERM GOAL #3   Title  Patient will complete gait velocity assessment with goal to be set as appropriate.     Baseline  04/26/17: 0.63 ft/sec with RW.    Time  1    Period  Weeks    Status  Achieved      PT SHORT TERM GOAL #4   Title  Patient will improve 5x sit to stand to <=17 sec (with use of hands) to demonstrate improved LE strength and balance.     Time  4    Period  Weeks    Status  New        PT Long Term Goals - 04/16/17 1709      PT LONG TERM GOAL #1   Title  The patient will perform updated HEP with assist from spouse. (Target for LTGs 06/15/17)    Time  8    Period  Weeks    Status  New    Target Date  06/15/17      PT LONG TERM GOAL #2   Title  Patient will ambulate 300 ft without seated rest break with supervision to improve his ability and confidence to ambulate in his home.    Time  8    Period  Weeks    Status  New      PT LONG TERM GOAL #3   Title  Patient will improve gait velocity to ________ to demonstrate decreasing fall risk.  (goal to be set based on level at 4 week/STG assessment)    Time  8    Period  Weeks    Status  New      PT LONG TERM GOAL #4   Title  Patient will improve Berg score to ________ to demonstrate decreasing fall risk. (goal to be set based on level at 4 week/STG assessment)    Time  8    Period  Weeks    Status  New      PT LONG TERM GOAL #5   Title  Patient will improve 5x sit to stand to <=15 seconds (with use of UEs) to demonstrate improved LE strength and balance.    Time  8    Period  Weeks    Status  New            Plan - 05/04/17 0739    Clinical Impression Statement  Focused on gait and strength training. Reviewed standing hip abdct at the sink and pt continues with excessive use of UEs and leaning trunk forward when lifing LLE. As proper technique facilitated, noted Rt knee partial buckle x2 and instructed pt to stop doing this exercise at home. Focused on other exercises to strengthen hip abductors with pt struggling with technique. Ultimately assigned sitting hip abdct with green band. Patient can continue to benefit from PT to improve safety with ambulation.    Rehab Potential  Good    Clinical Impairments Affecting Rehab Potential  rt knee pain    PT Frequency  2x / week    PT Duration  8 weeks    PT Treatment/Interventions  ADLs/Self Care Home Management;Gait training;DME Instruction;Functional mobility training;Therapeutic activities;Therapeutic exercise;Balance training;Neuromuscular re-education;Cognitive remediation;Patient/family education;Orthotic Fit/Training    PT Next Visit Plan  add sit to stand and LAQ to HEP, ?make exercise grid to help divide exercises; Gait training and tolerance; standing hip exercises     PT Home Exercise Plan  SLR; Supine/seated marching; Supine/seated hip abdct; Bridges; Standing at sink (EO 30 sec, EC 30 sec); Tandem stance; Walk with walker every other day (180 ft); LEs to "walk" w/c around house.     Consulted and Agree with Plan of  Care  Patient;Family member/caregiver    Family Member Consulted  spouse       Patient will benefit from skilled therapeutic intervention in order to improve the following deficits and impairments:  Abnormal gait, Decreased activity tolerance, Decreased balance, Decreased cognition, Decreased mobility, Decreased range of motion, Difficulty walking, Impaired sensation, Postural dysfunction, Pain  Visit Diagnosis: Other abnormalities of gait and mobility  Muscle weakness (generalized)     Problem List Patient Active Problem List   Diagnosis Date Noted  . TIA (transient ischemic attack)   . Presence of permanent cardiac pacemaker   . Hypertension   . Hyperlipidemia   . Coronary artery disease   . Basal cell carcinoma   . Arthritis   . Pressure injury of skin 01/08/2017  . Stroke ([place]) 01/07/2017  . Ischemic stroke ([place])   . Second degree Mobitz II AV block 10/23/2016  . Seizures ([place]) 10/09/2016  . Healthcare maintenance 10/01/2016  . Restlessness and agitation 07/02/2016  . Vascular dementia with behavior disturbance 06/29/2016  . Hemiparesis and other late effects of cerebrovascular accident ([place]) 06/29/2016  .  Embolic stroke ([place]) 68/34/1962  . Right hemiparesis ([place])   . History of CVA with residual deficit   . Chronic anticoagulation   . Atrial fibrillation with rapid ventricular response ([place])   . Seizure prophylaxis   . Diastolic dysfunction   . Bacteremia due to Gram-positive bacteria   . Altered mental status   . Dementia without behavioral disturbance   . Leukocytosis   . Acute blood loss anemia   . Acute encephalopathy 06/04/2016  . PAF (paroxysmal atrial fibrillation) ([place])   . Iron deficiency anemia 04/04/2016  . History of stroke 04/04/2016  . Cerebral hemorrhage (HCC) w/ SDH s/p IV tPA   . Coronary artery disease involving native coronary artery of native heart without angina pectoris   . Orthostatic hypotension   . History of right hip replacement    . Acute ischemic stroke ([place]) - L temporal lobe s/p tPA 03/30/2016  . BPH (benign prostatic hyperplasia) 11/25/2015  . GERD (gastroesophageal reflux disease) 11/25/2015  . CKD (chronic kidney disease), stage II 11/25/2015  . Overactive bladder   . Cerebrovascular accident (CVA) ([place]) 09/18/2015  . Gait disturbance, post-stroke 06/23/2015  . 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 Thalamic infarction ([place]) 06/21/2015  . Benign essential HTN   . Type 2 diabetes mellitus with complication, without long-term current use of insulin ([place])   . Dysphagia as late effect of cerebrovascular disease   . Hyponatremia   . HLD (hyperlipidemia)   . COPD (chronic obstructive pulmonary disease) ([place]) 03/09/2011    Rexanne Mano, PT 05/04/2017, 7:55 AM  [facility] [address] [place], [place], [address] Phone: [phone]   Fax:  [phone]  Name: Herbert Marquez MRN: [id number] Date of Birth: Jan 18, 1928

## 2017-05-06 DIAGNOSIS — K6289 Other specified diseases of anus and rectum: Secondary | ICD-10-CM | POA: Diagnosis not present

## 2017-05-06 DIAGNOSIS — R569 Unspecified convulsions: Secondary | ICD-10-CM | POA: Diagnosis not present

## 2017-05-06 DIAGNOSIS — E782 Mixed hyperlipidemia: Secondary | ICD-10-CM | POA: Diagnosis not present

## 2017-05-07 ENCOUNTER — Ambulatory Visit: Payer: Medicare Other | Admitting: Physical Therapy

## 2017-05-07 ENCOUNTER — Encounter: Payer: Self-pay | Admitting: Physical Therapy

## 2017-05-07 DIAGNOSIS — M6281 Muscle weakness (generalized): Secondary | ICD-10-CM

## 2017-05-07 DIAGNOSIS — R2689 Other abnormalities of gait and mobility: Secondary | ICD-10-CM | POA: Diagnosis not present

## 2017-05-07 DIAGNOSIS — R2681 Unsteadiness on feet: Secondary | ICD-10-CM | POA: Diagnosis not present

## 2017-05-08 NOTE — Therapy (Signed)
[facility] [address] [place], [place], [address] Phone: [phone]   Fax:  [phone]  Physical Therapy Treatment  Patient Details  Name: Herbert Marquez MRN: [id number] Date of Birth: 01-06-1928 Referring Provider: Charlett Blake, MD   Encounter Date: 05/07/2017  PT End of Session - 05/08/17 0958    Visit Number  5    Number of Visits  17    Date for PT Re-Evaluation  06/15/17    Authorization Type  [employer] medicare    Authorization Time Period  04/16/17 to 06/15/17    PT Start Time  1156 Arrived at incorrect appointment time; able to be fit in, but started late    PT Stop Time  1232    PT Time Calculation (min)  36 min    Equipment Utilized During Treatment  Gait belt    Activity Tolerance  Patient tolerated treatment well    Behavior During Therapy  [facility] for tasks assessed/performed       Past Medical History:  Diagnosis Date  . Arthritis    "pretty much all over"   . Basal cell carcinoma    "several burned off his body, face, head"  . BPH (benign prostatic hypertrophy)   . Coronary artery disease    a. s/p PCI of RCA in 2006  . CVA (cerebral infarction)    a. 06/2015: left thalamic and bilateral PCA  . GERD (gastroesophageal reflux disease)   . Hyperlipidemia   . Hypertension   . Presence of permanent cardiac pacemaker   . Seizures ([place])   . Stroke ([place])   . TIA (transient ischemic attack)    Approximately 6 weeks post-cardiac catheterization.     Past Surgical History:  Procedure Laterality Date  . CARDIOVASCULAR STRESS TEST  07/01/2007   EF 74%  . CATARACT EXTRACTION, BILATERAL    . CORONARY ANGIOPLASTY WITH STENT PLACEMENT  10/2004   stenting x 2 to RCA  . FEMUR IM NAIL Right 11/26/2015   Procedure: INTRAMEDULLARY RIGHT (IM) NAIL FEMORAL;  Surgeon: Rod Can, MD;  [facility];  Service: Orthopedics;  Laterality: Right;  . FRACTURE SURGERY    . HERNIA REPAIR    . HIP ARTHROPLASTY   03/09/2011   Procedure: ARTHROPLASTY BIPOLAR HIP;  Surgeon: Mauri Pole;  [facility];  Service: Orthopedics;  Laterality: Left;  . INSERT / REPLACE / REMOVE PACEMAKER  10/23/2016  . LAPAROSCOPIC INCISIONAL / UMBILICAL / VENTRAL HERNIA REPAIR     "below his naval"  . PACEMAKER IMPLANT N/A 10/23/2016   Procedure: Pacemaker Implant;  Surgeon: Thompson Grayer, MD;  Location: [facility];  Service: Cardiovascular;  Laterality: N/A;    There were no vitals filed for this visit.  Subjective Assessment - 05/07/17 1200    Subjective  Had a bad night the other night, but went to the doctor and I feel better.    Patient is accompained by:  Family member    Pertinent History  L THR, R hip fracture, h/o multiple CVAs, vascular dementia.    Patient Stated Goals  Want to be walking better. Walking more in the home (not using wheelchair).     Currently in Pain?  No/denies                      [facility] Adult PT Treatment/Exercise - 05/08/17 0001      Transfers   Transfers  Sit to Stand;Stand to Sit  Sit to Stand  4: Min guard;With upper extremity assist;From bed;From chair/3-in-1    Stand to Sit  5: Supervision;With upper extremity assist;To bed;To chair/3-in-1    Stand to Sit Details  VCs for controlled sit    Number of Reps  Other reps (comment) 5 reps from mat, then 5 additional reps-from mat, chair    Transfer Cueing  Cues for upright posture/hold for 5 seconds upon standing, for functional lower extremity strengthening.      Ambulation/Gait   Ambulation/Gait  Yes    Ambulation/Gait Assistance  4: Min guard    Ambulation Distance (Feet)  80 Feet then 40 ft; 100 ft    Assistive device  Rolling walker    Gait Pattern  Step-through pattern;Decreased stance time - right;Decreased stride length;Decreased dorsiflexion - right;Right flexed knee in stance;Trunk flexed    Ambulation Surface  Indoor    Gait Comments  VCs for R knee extension and full foot placement with stance       Exercises   Exercises  Knee/Hip      Knee/Hip Exercises: Seated   Clamshell with TheraBand  Red (no green available in clinic today); x 10 reps    Abduction/Adduction   Strengthening;Both;2 sets;10 reps    Abd/Adduction Limitations  red band; stepping out and in 1 leg at a time green band not available in clinic today      Knee/Hip Exercises: Supine   Bridges  Strengthening;Both;1 set;10 reps    Other Supine Knee/Hip Exercises  Supine clamshell with red theraband resistance x 10 reps; then performed sidelying hip abduction clamshell on RLE x 10 reps               PT Short Term Goals - 04/26/17 1058      PT SHORT TERM GOAL #1   Title  The patient will perform HEP with assist from wife as needed for LE strengthening, standing balance and general mobility.  TARGET DATE FOR ALL STGs:  05/16/16    Time  4    Period  Weeks    Status  New      PT SHORT TERM GOAL #2   Title  Patient will complete Berg Balance Assessment with goal to be set as appropriate.     Baseline  Scored 18/56 on 04/26/17    Time  1    Period  Weeks    Status  Achieved      PT SHORT TERM GOAL #3   Title  Patient will complete gait velocity assessment with goal to be set as appropriate.     Baseline  04/26/17: 0.63 ft/sec with RW.    Time  1    Period  Weeks    Status  Achieved      PT SHORT TERM GOAL #4   Title  Patient will improve 5x sit to stand to <=17 sec (with use of hands) to demonstrate improved LE strength and balance.     Time  4    Period  Weeks    Status  New        PT Long Term Goals - 04/16/17 1709      PT LONG TERM GOAL #1   Title  The patient will perform updated HEP with assist from spouse. (Target for LTGs 06/15/17)    Time  8    Period  Weeks    Status  New    Target Date  06/15/17      PT LONG TERM GOAL #2  Title  Patient will ambulate 300 ft without seated rest break with supervision to improve his ability and confidence to ambulate in his home.    Time  8     Period  Weeks    Status  New      PT LONG TERM GOAL #3   Title  Patient will improve gait velocity to ________ to demonstrate decreasing fall risk. (goal to be set based on level at 4 week/STG assessment)    Time  8    Period  Weeks    Status  New      PT LONG TERM GOAL #4   Title  Patient will improve Berg score to ________ to demonstrate decreasing fall risk. (goal to be set based on level at 4 week/STG assessment)    Time  8    Period  Weeks    Status  New      PT LONG TERM GOAL #5   Title  Patient will improve 5x sit to stand to <=15 seconds (with use of UEs) to demonstrate improved LE strength and balance.    Time  8    Period  Weeks    Status  New            Plan - 05/08/17 0959    Clinical Impression Statement  Focused on gait training and strengthening today.  With exercises attempted, pt reports either having done them at home (supine and seated) or told not to do them anymore by PT (standing).  Standing exercises not performed other than sit to stand until primary PT can confirm.  With supine and seated exercises, pt does need cues for correct technique and slowed pace with exercises.  Pt will continue to benefit from skilled PT to improve strength and safety with gait.    Rehab Potential  Good    Clinical Impairments Affecting Rehab Potential  rt knee pain    PT Frequency  2x / week    PT Duration  8 weeks    PT Treatment/Interventions  ADLs/Self Care Home Management;Gait training;DME Instruction;Functional mobility training;Therapeutic activities;Therapeutic exercise;Balance training;Neuromuscular re-education;Cognitive remediation;Patient/family education;Orthotic Fit/Training    PT Next Visit Plan  add sit to stand and LAQ to HEP, ?make exercise grid to help divide exercises; Gait training and tolerance; standing hip exercises     PT Home Exercise Plan  SLR; Supine/seated marching; Supine/seated hip abdct; Bridges; Standing at sink (EO 30 sec, EC 30 sec); Tandem  stance; Walk with walker every other day (180 ft); LEs to "walk" w/c around house.     Consulted and Agree with Plan of Care  Patient;Family member/caregiver    Family Member Consulted  spouse       Patient will benefit from skilled therapeutic intervention in order to improve the following deficits and impairments:  Abnormal gait, Decreased activity tolerance, Decreased balance, Decreased cognition, Decreased mobility, Decreased range of motion, Difficulty walking, Impaired sensation, Postural dysfunction, Pain  Visit Diagnosis: Muscle weakness (generalized)  Other abnormalities of gait and mobility     Problem List Patient Active Problem List   Diagnosis Date Noted  . TIA (transient ischemic attack)   . Presence of permanent cardiac pacemaker   . Hypertension   . Hyperlipidemia   . Coronary artery disease   . Basal cell carcinoma   . Arthritis   . Pressure injury of skin 01/08/2017  . Stroke ([place]) 01/07/2017  . Ischemic stroke ([place])   . Second degree Mobitz II AV block 10/23/2016  .  Seizures ([place]) 10/09/2016  . Healthcare maintenance 10/01/2016  . Restlessness and agitation 07/02/2016  . Vascular dementia with behavior disturbance 06/29/2016  . Hemiparesis and other late effects of cerebrovascular accident ([place]) 06/29/2016  . Embolic stroke ([place]) 71/24/5809  . Right hemiparesis ([place])   . History of CVA with residual deficit   . Chronic anticoagulation   . Atrial fibrillation with rapid ventricular response ([place])   . Seizure prophylaxis   . Diastolic dysfunction   . Bacteremia due to Gram-positive bacteria   . Altered mental status   . Dementia without behavioral disturbance   . Leukocytosis   . Acute blood loss anemia   . Acute encephalopathy 06/04/2016  . PAF (paroxysmal atrial fibrillation) ([place])   . Iron deficiency anemia 04/04/2016  . History of stroke 04/04/2016  . Cerebral hemorrhage (HCC) w/ SDH s/p IV tPA   . Coronary artery disease involving native  coronary artery of native heart without angina pectoris   . Orthostatic hypotension   . History of right hip replacement   . Acute ischemic stroke ([place]) - L temporal lobe s/p tPA 03/30/2016  . BPH (benign prostatic hyperplasia) 11/25/2015  . GERD (gastroesophageal reflux disease) 11/25/2015  . CKD (chronic kidney disease), stage II 11/25/2015  . Overactive bladder   . Cerebrovascular accident (CVA) ([place]) 09/18/2015  . Gait disturbance, post-stroke 06/23/2015  . 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 Thalamic infarction ([place]) 06/21/2015  . Benign essential HTN   . Type 2 diabetes mellitus with complication, without long-term current use of insulin ([place])   . Dysphagia as late effect of cerebrovascular disease   . Hyponatremia   . HLD (hyperlipidemia)   . COPD (chronic obstructive pulmonary disease) ([place]) 03/09/2011    Jessic Standifer W. 05/08/2017, 10:01 AM  Frazier Butt., PT   [facility] [address] [place], [place], [address] Phone: [phone]   Fax:  [phone]  Name: KAWON WILLCUTT MRN: [id number] Date of Birth: [birth year]

## 2017-05-10 ENCOUNTER — Encounter: Payer: Self-pay | Admitting: Physical Therapy

## 2017-05-10 ENCOUNTER — Ambulatory Visit: Payer: Medicare Other | Admitting: Physical Therapy

## 2017-05-10 DIAGNOSIS — R2681 Unsteadiness on feet: Secondary | ICD-10-CM

## 2017-05-10 DIAGNOSIS — M6281 Muscle weakness (generalized): Secondary | ICD-10-CM

## 2017-05-10 DIAGNOSIS — R2689 Other abnormalities of gait and mobility: Secondary | ICD-10-CM | POA: Diagnosis not present

## 2017-05-10 NOTE — Patient Instructions (Addendum)
  Sit to Stand Transfers:  1. Scoot out to the edge of the chair 2. Place your feet flat on the floor, shoulder width apart.  Make sure your feet are tucked just under your knees. 3. Lean forward (nose over toes) with momentum, and stand up tall with your best posture.  If you need to use your arms, use them as a quick boost up to stand. 4. If you are in a low or soft chair, you can lean back and then forward up to stand, in order to get more momentum. 5. Once you are standing, make sure you are looking ahead and standing tall.  To sit down:  1. Back up until you feel the chair behind your legs. 2. Bend at you hips, reaching  Back for you chair, if needed, then slowly squat to sit down on your chair.  Functional Quadriceps: Sit to Stand    Sit on edge of chair, feet flat on floor. Use arms as little as necessary. Stand upright, extending knees fully. TAke a deep breath and then sit down SLOWLY. Repeat __10__ times per set. Do __1__ sets per session. Do __1__ sessions per day.  http://orth.exer.us/734   Copyright  VHI. All rights reserved.    (Exercise) Monday Tuesday Wednesday Thursday Friday Saturday Sunday

## 2017-05-11 NOTE — Therapy (Signed)
[facility] [address] [place], [place], [address] Phone: [phone]   Fax:  [phone]  Physical Therapy Treatment  Patient Details  Name: Herbert Marquez MRN: [id number] Date of Birth: 1927/11/04 Referring Provider: Charlett Blake, MD   Encounter Date: 05/10/2017  PT End of Session - 05/11/17 0709    Visit Number  6    Number of Visits  17    Date for PT Re-Evaluation  06/15/17    Authorization Type  [employer] medicare    Authorization Time Period  04/16/17 to 06/15/17    PT Start Time  1018    PT Stop Time  1100    PT Time Calculation (min)  42 min    Equipment Utilized During Treatment  Gait belt    Activity Tolerance  Patient tolerated treatment well    Behavior During Therapy  [facility] for tasks assessed/performed       Past Medical History:  Diagnosis Date  . Arthritis    "pretty much all over"   . Basal cell carcinoma    "several burned off his body, face, head"  . BPH (benign prostatic hypertrophy)   . Coronary artery disease    a. s/p PCI of RCA in 2006  . CVA (cerebral infarction)    a. 06/2015: left thalamic and bilateral PCA  . GERD (gastroesophageal reflux disease)   . Hyperlipidemia   . Hypertension   . Presence of permanent cardiac pacemaker   . Seizures ([place])   . Stroke ([place])   . TIA (transient ischemic attack)    Approximately 6 weeks post-cardiac catheterization.     Past Surgical History:  Procedure Laterality Date  . CARDIOVASCULAR STRESS TEST  07/01/2007   EF 74%  . CATARACT EXTRACTION, BILATERAL    . CORONARY ANGIOPLASTY WITH STENT PLACEMENT  10/2004   stenting x 2 to RCA  . FEMUR IM NAIL Right 11/26/2015   Procedure: INTRAMEDULLARY RIGHT (IM) NAIL FEMORAL;  Surgeon: Rod Can, MD;  Location: [facility];  Service: Orthopedics;  Laterality: Right;  . FRACTURE SURGERY    . HERNIA REPAIR    . HIP ARTHROPLASTY  03/09/2011   Procedure: ARTHROPLASTY BIPOLAR HIP;  Surgeon: Mauri Pole;  Location: [facility];  Service: Orthopedics;  Laterality: Left;  . INSERT / REPLACE / REMOVE PACEMAKER  10/23/2016  . LAPAROSCOPIC INCISIONAL / UMBILICAL / VENTRAL HERNIA REPAIR     "below his naval"  . PACEMAKER IMPLANT N/A 10/23/2016   Procedure: Pacemaker Implant;  Surgeon: Thompson Grayer, MD;  Location: [facility];  Service: Cardiovascular;  Laterality: N/A;    There were no vitals filed for this visit.  Subjective Assessment - 05/10/17 1021    Subjective  Doing more walking than he was. Maybe not every day, but more.     Patient is accompained by:  Family member    Pertinent History  L THR, R hip fracture, h/o multiple CVAs, vascular dementia.    Patient Stated Goals  Want to be walking better. Walking more in the home (not using wheelchair).     Currently in Pain?  No/denies        Treatment-  Ther-ex- Sit to stand x 10 with bil UE push off armrests and descending with hands on his thighs with emphasis on slow, controlled descent. Required vc for proper set-up prior to attempting standing 5 out of 10.  Standing at counter with single UE support with reaching to open overhead cabinets,  retrieve items, open dishwasher and place items in dishwasher, then reverse. Side-stepping along countertop x 4 lengths  Gait training- vc for weightbearing through RLE with heelstrike to foot flat (he tends to walk on forefoot only on the right--reports due to knee pain); vc for less WB thru bil UEs; 50 ft, 115, 60                      PT Education - 05/11/17 0708    Education provided  Yes    Education Details  addition to HEP and exercise grids for planning and/or tracking his exercises    Person(s) Educated  Patient;Spouse    Methods  Explanation;Demonstration;Handout    Comprehension  Verbalized understanding;Returned demonstration       PT Short Term Goals - 04/26/17 1058      PT SHORT TERM GOAL #1   Title  The patient will perform HEP with assist from  wife as needed for LE strengthening, standing balance and general mobility.  TARGET DATE FOR ALL STGs:  05/16/16    Time  4    Period  Weeks    Status  New      PT SHORT TERM GOAL #2   Title  Patient will complete Berg Balance Assessment with goal to be set as appropriate.     Baseline  Scored 18/56 on 04/26/17    Time  1    Period  Weeks    Status  Achieved      PT SHORT TERM GOAL #3   Title  Patient will complete gait velocity assessment with goal to be set as appropriate.     Baseline  04/26/17: 0.63 ft/sec with RW.    Time  1    Period  Weeks    Status  Achieved      PT SHORT TERM GOAL #4   Title  Patient will improve 5x sit to stand to <=17 sec (with use of hands) to demonstrate improved LE strength and balance.     Time  4    Period  Weeks    Status  New        PT Long Term Goals - 04/16/17 1709      PT LONG TERM GOAL #1   Title  The patient will perform updated HEP with assist from spouse. (Target for LTGs 06/15/17)    Time  8    Period  Weeks    Status  New    Target Date  06/15/17      PT LONG TERM GOAL #2   Title  Patient will ambulate 300 ft without seated rest break with supervision to improve his ability and confidence to ambulate in his home.    Time  8    Period  Weeks    Status  New      PT LONG TERM GOAL #3   Title  Patient will improve gait velocity to ________ to demonstrate decreasing fall risk. (goal to be set based on level at 4 week/STG assessment)    Time  8    Period  Weeks    Status  New      PT LONG TERM GOAL #4   Title  Patient will improve Berg score to ________ to demonstrate decreasing fall risk. (goal to be set based on level at 4 week/STG assessment)    Time  8    Period  Weeks    Status  New  PT LONG TERM GOAL #5   Title  Patient will improve 5x sit to stand to <=15 seconds (with use of UEs) to demonstrate improved LE strength and balance.    Time  8    Period  Weeks    Status  New            Plan - 05/11/17 0710     Clinical Impression Statement  Session included education on addition to HEP, strengthening, balance and gait training. Patient required 2 seated rest breaks during session (an improvement). He continues to benefit from PT.    Rehab Potential  Good    Clinical Impairments Affecting Rehab Potential  rt knee pain    PT Frequency  2x / week    PT Duration  8 weeks    PT Treatment/Interventions  ADLs/Self Care Home Management;Gait training;DME Instruction;Functional mobility training;Therapeutic activities;Therapeutic exercise;Balance training;Neuromuscular re-education;Cognitive remediation;Patient/family education;Orthotic Fit/Training    PT Next Visit Plan  check STGs; Gait training with focus on right heel down in stance, upright posture, and tolerance; standing exercises emphasizing use of RLE in stance with rt heel down (reaching, weight shifting)    PT Home Exercise Plan  SLR; Supine/seated marching; Supine/seated hip abdct; Bridges; Standing at sink (EO 30 sec, EC 30 sec); Tandem stance; Walk with walker every other day (180 ft); LEs to "walk" w/c around house.     Consulted and Agree with Plan of Care  Patient;Family member/caregiver    Family Member Consulted  spouse       Patient will benefit from skilled therapeutic intervention in order to improve the following deficits and impairments:  Abnormal gait, Decreased activity tolerance, Decreased balance, Decreased cognition, Decreased mobility, Decreased range of motion, Difficulty walking, Impaired sensation, Postural dysfunction, Pain  Visit Diagnosis: Muscle weakness (generalized)  Other abnormalities of gait and mobility  Unsteadiness on feet     Problem List Patient Active Problem List   Diagnosis Date Noted  . TIA (transient ischemic attack)   . Presence of permanent cardiac pacemaker   . Hypertension   . Hyperlipidemia   . Coronary artery disease   . Basal cell carcinoma   . Arthritis   . Pressure injury of skin  01/08/2017  . Stroke ([place]) 01/07/2017  . Ischemic stroke ([place])   . Second degree Mobitz II AV block 10/23/2016  . Seizures ([place]) 10/09/2016  . Healthcare maintenance 10/01/2016  . Restlessness and agitation 07/02/2016  . Vascular dementia with behavior disturbance 06/29/2016  . Hemiparesis and other late effects of cerebrovascular accident ([place]) 06/29/2016  . Embolic stroke ([place]) 77/82/4235  . Right hemiparesis ([place])   . History of CVA with residual deficit   . Chronic anticoagulation   . Atrial fibrillation with rapid ventricular response ([place])   . Seizure prophylaxis   . Diastolic dysfunction   . Bacteremia due to Gram-positive bacteria   . Altered mental status   . Dementia without behavioral disturbance   . Leukocytosis   . Acute blood loss anemia   . Acute encephalopathy 06/04/2016  . PAF (paroxysmal atrial fibrillation) ([place])   . Iron deficiency anemia 04/04/2016  . History of stroke 04/04/2016  . Cerebral hemorrhage (HCC) w/ SDH s/p IV tPA   . Coronary artery disease involving native coronary artery of native heart without angina pectoris   . Orthostatic hypotension   . History of right hip replacement   . Acute ischemic stroke ([place]) - L temporal lobe s/p tPA 03/30/2016  . BPH (benign prostatic hyperplasia) 11/25/2015  .  GERD (gastroesophageal reflux disease) 11/25/2015  . CKD (chronic kidney disease), stage II 11/25/2015  . Overactive bladder   . Cerebrovascular accident (CVA) ([place]) 09/18/2015  . Gait disturbance, post-stroke 06/23/2015  . 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 Thalamic infarction ([place]) 06/21/2015  . Benign essential HTN   . Type 2 diabetes mellitus with complication, without long-term current use of insulin ([place])   . Dysphagia as late effect of cerebrovascular disease   . Hyponatremia   . HLD (hyperlipidemia)   . COPD (chronic obstructive pulmonary disease) ([place]) 03/09/2011    Rexanne Mano, PT 05/11/2017, 7:16 AM  [facility] [address] [place], [place], [address] Phone: [phone]   Fax:  [phone]  Name: Herbert Marquez MRN: [id number] Date of Birth: 1928-02-02

## 2017-05-14 ENCOUNTER — Encounter: Payer: Self-pay | Admitting: Physical Therapy

## 2017-05-14 ENCOUNTER — Ambulatory Visit: Payer: Medicare Other | Admitting: Physical Therapy

## 2017-05-14 DIAGNOSIS — R2681 Unsteadiness on feet: Secondary | ICD-10-CM

## 2017-05-14 DIAGNOSIS — M6281 Muscle weakness (generalized): Secondary | ICD-10-CM | POA: Diagnosis not present

## 2017-05-14 DIAGNOSIS — R2689 Other abnormalities of gait and mobility: Secondary | ICD-10-CM | POA: Diagnosis not present

## 2017-05-14 NOTE — Therapy (Signed)
[facility] [address] [place], [place], [address] Phone: [phone]   Fax:  [phone]  Physical Therapy Treatment  Patient Details  Name: Herbert Marquez MRN: [id number] Date of Birth: Jan 13, 1928 Referring Provider: Charlett Blake, MD   Encounter Date: 05/14/2017  PT End of Session - 05/14/17 1320    Visit Number  7    Number of Visits  17    Date for PT Re-Evaluation  06/15/17    Authorization Type  [employer] medicare    Authorization Time Period  04/16/17 to 06/15/17    PT Start Time  1149    PT Stop Time  1238    PT Time Calculation (min)  49 min    Activity Tolerance  Patient tolerated treatment well    Behavior During Therapy  [facility] for tasks assessed/performed       Past Medical History:  Diagnosis Date  . Arthritis    "pretty much all over"   . Basal cell carcinoma    "several burned off his body, face, head"  . BPH (benign prostatic hypertrophy)   . Coronary artery disease    a. s/p PCI of RCA in 2006  . CVA (cerebral infarction)    a. 06/2015: left thalamic and bilateral PCA  . GERD (gastroesophageal reflux disease)   . Hyperlipidemia   . Hypertension   . Presence of permanent cardiac pacemaker   . Seizures ([place])   . Stroke ([place])   . TIA (transient ischemic attack)    Approximately 6 weeks post-cardiac catheterization.     Past Surgical History:  Procedure Laterality Date  . CARDIOVASCULAR STRESS TEST  07/01/2007   EF 74%  . CATARACT EXTRACTION, BILATERAL    . CORONARY ANGIOPLASTY WITH STENT PLACEMENT  10/2004   stenting x 2 to RCA  . FEMUR IM NAIL Right 11/26/2015   Procedure: INTRAMEDULLARY RIGHT (IM) NAIL FEMORAL;  Surgeon: Rod Can, MD;  [facility];  Service: Orthopedics;  Laterality: Right;  . FRACTURE SURGERY    . HERNIA REPAIR    . HIP ARTHROPLASTY  03/09/2011   Procedure: ARTHROPLASTY BIPOLAR HIP;  Surgeon: Mauri Pole;  [facility];  Service: Orthopedics;   Laterality: Left;  . INSERT / REPLACE / REMOVE PACEMAKER  10/23/2016  . LAPAROSCOPIC INCISIONAL / UMBILICAL / VENTRAL HERNIA REPAIR     "below his naval"  . PACEMAKER IMPLANT N/A 10/23/2016   Procedure: Pacemaker Implant;  Surgeon: Thompson Grayer, MD;  Location: [facility];  Service: Cardiovascular;  Laterality: N/A;    There were no vitals filed for this visit.  Subjective Assessment - 05/14/17 1153    Subjective  Was hurting this morning, but used his cream and feels better now. Walking every day at home. Walked down his ramp and to the car and then back yesterday.     Patient is accompained by:  Family member    Pertinent History  L THR, R hip fracture, h/o multiple CVAs, vascular dementia.    Patient Stated Goals  Want to be walking better. Walking more in the home (not using wheelchair). 05/14/17 added wants to be able to walk from parking lot into building and out again at end of session    Currently in Pain?  No/denies         [facility] PT Assessment - 05/14/17 1210      Berg Balance Test   Sit to Stand  Able to stand  independently using hands  Standing Unsupported  Able to stand 2 minutes with supervision    Sitting with Back Unsupported but Feet Supported on Floor or Stool  Able to sit safely and securely 2 minutes    Stand to Sit  Controls descent by using hands    Transfers  Able to transfer safely, definite need of hands    Standing Unsupported with Eyes Closed  Able to stand 10 seconds with supervision    Standing Ubsupported with Feet Together  Needs help to attain position but able to stand for 30 seconds with feet together    From Standing, Reach Forward with Outstretched Arm  Reaches forward but needs supervision    From Standing Position, Pick up Object from Floor  Unable to pick up and needs supervision    From Standing Position, Turn to Look Behind Over each Shoulder  Needs supervision when turning    Turn 360 Degrees  Needs assistance while turning     Standing Unsupported, Alternately Place Feet on Step/Stool  Needs assistance to keep from falling or unable to try    Standing Unsupported, One Foot in Front  Needs help to step but can hold 15 seconds    Standing on One Leg  Unable to try or needs assist to prevent fall    Total Score  24    Berg comment:  < 36 high risk for falls (close to 100%)                   [facility] Adult PT Treatment/Exercise - 05/14/17 1201      Transfers   Transfers  Sit to Stand;Stand to Sit    Sit to Stand  6: Modified independent (Device/Increase time)    Five time sit to stand comments   34.16    Stand to Sit  5: Supervision;With upper extremity assist;With armrests      Ambulation/Gait   Ambulation/Gait  Yes    Ambulation/Gait Assistance  5: Supervision    Ambulation/Gait Assistance Details  better ability to get rt heel down in stance (difficult due to rt knee contracture     Ambulation Distance (Feet)  119 Feet 75    Assistive device  Rolling walker    Gait Pattern  Step-through pattern;Decreased stance time - right;Decreased stride length;Decreased dorsiflexion - right;Right flexed knee in stance;Trunk flexed    Ambulation Surface  Indoor      Posture/Postural Control   Posture/Postural Control  Postural limitations    Postural Limitations  Rounded Shoulders;Forward head    Posture Comments  with standing activities and especially gait, vc for upright posture and eyes looking forward       Trialed use of built-up shoe simulator adding ~1/2 inch to rt shoe (to correct for rt knee contracture). Patient did not feel it made much difference in his balance. Discussed process with pt and wife in case he decides he is interested.       PT Education - 05/14/17 1317    Education provided  Yes    Education Details  results of 4 week assessments; discussed his goals moving forward    Person(s) Educated  Patient;Spouse    Methods  Explanation    Comprehension  Verbalized understanding        PT Short Term Goals - 05/14/17 1322      PT SHORT TERM GOAL #1   Title  The patient will perform HEP with assist from wife as needed for LE strengthening, standing balance and general mobility.  TARGET DATE FOR ALL STGs:  05/16/16    Baseline  05/14/17 met    Time  4    Period  Weeks    Status  Achieved      PT SHORT TERM GOAL #2   Title  Patient will complete Berg Balance Assessment with goal to be set as appropriate.     Baseline  Scored 18/56 on 04/26/17    Time  1    Period  Weeks    Status  Achieved      PT SHORT TERM GOAL #3   Title  Patient will complete gait velocity assessment with goal to be set as appropriate.     Baseline  04/26/17: 0.63 ft/sec with RW.    Time  1    Period  Weeks    Status  Achieved      PT SHORT TERM GOAL #4   Title  Patient will improve 5x sit to stand to <=17 sec (with use of hands) to demonstrate improved LE strength and balance.     Time  4    Period  Weeks    Status  Not Met        PT Long Term Goals - 05/14/17 1329      PT LONG TERM GOAL #1   Title  The patient will perform updated HEP with assist from spouse. (Target for LTGs 06/15/17)    Time  8    Period  Weeks    Status  New      PT LONG TERM GOAL #2   Title  Patient will ambulate 300 ft without seated rest break with supervision to improve his ability and confidence to ambulate in his home.    Time  8    Period  Weeks    Status  New      PT LONG TERM GOAL #3   Title  Patient will improve gait velocity to ________ to demonstrate decreasing fall risk. (goal to be set based on level at 4 week/STG assessment)    Baseline  05/14/17 1.03 ft/sec with goal set for 1.30 ft/sec     Time  8    Period  Weeks    Status  New      PT LONG TERM GOAL #4   Title  Patient will improve Berg score to ________ to demonstrate decreasing fall risk. (goal to be set based on level at 4 week/STG assessment)    Baseline  05/14/17 24/56; goal for LTG 26/56    Time  8    Period  Weeks    Status   New      PT LONG TERM GOAL #5   Title  Patient will improve 5x sit to stand to <=15 seconds (with use of UEs) to demonstrate improved LE strength and balance.    Baseline  05/14/17 34.16 seconds; goal revised to <30 seconds (?accuracy of measure on evaluation?)    Time  8    Period  Weeks    Status  Revised            Plan - 05/14/17 1321    Clinical Impression Statement  Session included checking STGs with pt meeting 3 of 4 goals (including improved gait velocity and Berg Balance score). Did not meet 4th goal for 5 times sit to stand, and time actually was worse. Discussed results and confirmed LTGs moving forward. Educated patient that he likely will not need an additional 4 weeks to achieve the LTGs,  however wife seemed resistant to idea stating, "oh he's scheduled through the end of April and he will get what he needs." Encouraged her that he is not being discharged today, and we will continue to monitor his progress and discharge when appropriate.     Rehab Potential  Good    Clinical Impairments Affecting Rehab Potential  rt knee pain    PT Frequency  2x / week    PT Duration  8 weeks    PT Treatment/Interventions  ADLs/Self Care Home Management;Gait training;DME Instruction;Functional mobility training;Therapeutic activities;Therapeutic exercise;Balance training;Neuromuscular re-education;Cognitive remediation;Patient/family education;Orthotic Fit/Training    PT Next Visit Plan  Gait training with focus on right heel down in stance, upright posture, and tolerance (goal is 300 ft and walking out to his car); can also use Nustep for endurance; exercises for bil LEs;     PT Home Exercise Plan  SLR; Supine/seated marching; Supine/seated hip abdct; Bridges; Standing at sink (EO 30 sec, EC 30 sec); Tandem stance; Walk with walker every other day (180 ft); LEs to "walk" w/c around house.     Consulted and Agree with Plan of Care  Patient;Family member/caregiver    Family Member Consulted   spouse       Patient will benefit from skilled therapeutic intervention in order to improve the following deficits and impairments:  Abnormal gait, Decreased activity tolerance, Decreased balance, Decreased cognition, Decreased mobility, Decreased range of motion, Difficulty walking, Impaired sensation, Postural dysfunction, Pain  Visit Diagnosis: Muscle weakness (generalized)  Other abnormalities of gait and mobility  Unsteadiness on feet     Problem List Patient Active Problem List   Diagnosis Date Noted  . TIA (transient ischemic attack)   . Presence of permanent cardiac pacemaker   . Hypertension   . Hyperlipidemia   . Coronary artery disease   . Basal cell carcinoma   . Arthritis   . Pressure injury of skin 01/08/2017  . Stroke ([place]) 01/07/2017  . Ischemic stroke ([address])   . Second degree Mobitz II AV block 10/23/2016  . Seizures ([place]) 10/09/2016  . Healthcare maintenance 10/01/2016  . Restlessness and agitation 07/02/2016  . Vascular dementia with behavior disturbance 06/29/2016  . Hemiparesis and other late effects of cerebrovascular accident ([place]) 06/29/2016  . Embolic stroke ([place]) 88/89/1694  . Right hemiparesis ([place])   . History of CVA with residual deficit   . Chronic anticoagulation   . Atrial fibrillation with rapid ventricular response ([place])   . Seizure prophylaxis   . Diastolic dysfunction   . Bacteremia due to Gram-positive bacteria   . Altered mental status   . Dementia without behavioral disturbance   . Leukocytosis   . Acute blood loss anemia   . Acute encephalopathy 06/04/2016  . PAF (paroxysmal atrial fibrillation) ([place])   . Iron deficiency anemia 04/04/2016  . History of stroke 04/04/2016  . Cerebral hemorrhage (HCC) w/ SDH s/p IV tPA   . Coronary artery disease involving native coronary artery of native heart without angina pectoris   . Orthostatic hypotension   . History of right hip replacement   . Acute ischemic stroke ([place]) - L temporal  lobe s/p tPA 03/30/2016  . BPH (benign prostatic hyperplasia) 11/25/2015  . GERD (gastroesophageal reflux disease) 11/25/2015  . CKD (chronic kidney disease), stage II 11/25/2015  . Overactive bladder   . Cerebrovascular accident (CVA) ([place]) 09/18/2015  . Gait disturbance, post-stroke 06/23/2015  . 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 Thalamic infarction ([place]) 06/21/2015  .  Benign essential HTN   . Type 2 diabetes mellitus with complication, without long-term current use of insulin ([place])   . Dysphagia as late effect of cerebrovascular disease   . Hyponatremia   . HLD (hyperlipidemia)   . COPD (chronic obstructive pulmonary disease) ([place]) 03/09/2011    Rexanne Mano, PT 05/14/2017, 1:34 PM  [facility] [address] [place], [place], [address] Phone: [phone]   Fax:  [phone]  Name: ABHINAV MAYORQUIN MRN: [id number] Date of Birth: 1927/08/09

## 2017-05-17 ENCOUNTER — Ambulatory Visit: Payer: Medicare Other | Admitting: Physical Therapy

## 2017-05-17 ENCOUNTER — Encounter: Payer: Self-pay | Admitting: Physical Therapy

## 2017-05-17 DIAGNOSIS — R2689 Other abnormalities of gait and mobility: Secondary | ICD-10-CM | POA: Diagnosis not present

## 2017-05-17 DIAGNOSIS — M6281 Muscle weakness (generalized): Secondary | ICD-10-CM | POA: Diagnosis not present

## 2017-05-17 DIAGNOSIS — R2681 Unsteadiness on feet: Secondary | ICD-10-CM | POA: Diagnosis not present

## 2017-05-17 NOTE — Therapy (Signed)
[facility] [address] [place], [place], [address] Phone: [phone]   Fax:  [phone]  Physical Therapy Treatment  Patient Details  Name: Herbert Marquez MRN: [id number] Date of Birth: [birth year] Referring Provider: Charlett Blake, MD   Encounter Date: 05/17/2017  PT End of Session - 05/17/17 1800    Visit Number  8    Number of Visits  17    Date for PT Re-Evaluation  06/15/17    Authorization Type  [employer] medicare    Authorization Time Period  04/16/17 to 06/15/17    PT Start Time  1148    PT Stop Time  1231    PT Time Calculation (min)  43 min    Equipment Utilized During Treatment  Gait belt    Activity Tolerance  Patient tolerated treatment well    Behavior During Therapy  [facility] for tasks assessed/performed       Past Medical History:  Diagnosis Date  . Arthritis    "pretty much all over"   . Basal cell carcinoma    "several burned off his body, face, head"  . BPH (benign prostatic hypertrophy)   . Coronary artery disease    a. s/p PCI of RCA in 2006  . CVA (cerebral infarction)    a. 06/2015: left thalamic and bilateral PCA  . GERD (gastroesophageal reflux disease)   . Hyperlipidemia   . Hypertension   . Presence of permanent cardiac pacemaker   . Seizures ([place])   . Stroke ([place])   . TIA (transient ischemic attack)    Approximately 6 weeks post-cardiac catheterization.     Past Surgical History:  Procedure Laterality Date  . CARDIOVASCULAR STRESS TEST  07/01/2007   EF 74%  . CATARACT EXTRACTION, BILATERAL    . CORONARY ANGIOPLASTY WITH STENT PLACEMENT  10/2004   stenting x 2 to RCA  . FEMUR IM NAIL Right 11/26/2015   Procedure: INTRAMEDULLARY RIGHT (IM) NAIL FEMORAL;  Surgeon: Rod Can, MD;  Location: [facility];  Service: Orthopedics;  Laterality: Right;  . FRACTURE SURGERY    . HERNIA REPAIR    . HIP ARTHROPLASTY  03/09/2011   Procedure: ARTHROPLASTY BIPOLAR HIP;  Surgeon: Mauri Pole;  [facility]: [facility];  Service: Orthopedics;  Laterality: Left;  . INSERT / REPLACE / REMOVE PACEMAKER  10/23/2016  . LAPAROSCOPIC INCISIONAL / UMBILICAL / VENTRAL HERNIA REPAIR     "below his naval"  . PACEMAKER IMPLANT N/A 10/23/2016   Procedure: Pacemaker Implant;  Surgeon: Thompson Grayer, MD;  Location: [facility];  Service: Cardiovascular;  Laterality: N/A;    There were no vitals filed for this visit.  Subjective Assessment - 05/17/17 1152    Subjective  took a couple of tylenol before I came due to knee pain    Patient is accompained by:  Family member    Pertinent History  L THR, R hip fracture, h/o multiple CVAs, vascular dementia.    Patient Stated Goals  Want to be walking better. Walking more in the home (not using wheelchair). 05/14/17 added wants to be able to walk from parking lot into building and out again at end of session    Currently in Pain?  No/denies                      [facility] Adult PT Treatment/Exercise - 05/17/17 0001      Transfers   Comments  on/off Nustep machine with supervision (  except for needed assist to initially reach handles for hands      Ambulation/Gait   Ambulation/Gait Assistance  5: Supervision    Ambulation/Gait Assistance Details  improved upright posture and no rt knee buckling    Ambulation Distance (Feet)  60 Feet 60, 60, 40    Assistive device  Rolling walker    Gait Pattern  Step-through pattern;Decreased stance time - right;Decreased stride length;Decreased dorsiflexion - right;Right flexed knee in stance;Trunk flexed    Ambulation Surface  Indoor          Balance Exercises - 05/17/17 1809      Balance Exercises: Standing   Standing Eyes Opened  Narrow base of support (BOS);Wide (BOA);Foam/compliant surface;Head turns    Standing Eyes Closed  Wide (BOA);Narrow base of support (BOS);Foam/compliant surface    Tandem Stance  Eyes open;Foam/compliant surface;Intermittent upper extremity support    Wall Bumps   Hip    Wall Bumps-Hips  Eyes opened;Anterior/posterior;10 reps    Balance Beam  blue; step ups and gain balance, step down (forward/backward); balance feet apart and together intermittent UE support          PT Short Term Goals - 05/14/17 1322      PT SHORT TERM GOAL #1   Title  The patient will perform HEP with assist from wife as needed for LE strengthening, standing balance and general mobility.  TARGET DATE FOR ALL STGs:  05/16/16    Baseline  05/14/17 met    Time  4    Period  Weeks    Status  Achieved      PT SHORT TERM GOAL #2   Title  Patient will complete Berg Balance Assessment with goal to be set as appropriate.     Baseline  Scored 18/56 on 04/26/17    Time  1    Period  Weeks    Status  Achieved      PT SHORT TERM GOAL #3   Title  Patient will complete gait velocity assessment with goal to be set as appropriate.     Baseline  04/26/17: 0.63 ft/sec with RW.    Time  1    Period  Weeks    Status  Achieved      PT SHORT TERM GOAL #4   Title  Patient will improve 5x sit to stand to <=17 sec (with use of hands) to demonstrate improved LE strength and balance.     Time  4    Period  Weeks    Status  Not Met        PT Long Term Goals - 05/14/17 1329      PT LONG TERM GOAL #1   Title  The patient will perform updated HEP with assist from spouse. (Target for LTGs 06/15/17)    Time  8    Period  Weeks    Status  New      PT LONG TERM GOAL #2   Title  Patient will ambulate 300 ft without seated rest break with supervision to improve his ability and confidence to ambulate in his home.    Time  8    Period  Weeks    Status  New      PT LONG TERM GOAL #3   Title  Patient will improve gait velocity to ________ to demonstrate decreasing fall risk. (goal to be set based on level at 4 week/STG assessment)    Baseline  05/14/17 1.03 ft/sec with goal set for  1.30 ft/sec     Time  8    Period  Weeks    Status  New      PT LONG TERM GOAL #4   Title  Patient will  improve Berg score to ________ to demonstrate decreasing fall risk. (goal to be set based on level at 4 week/STG assessment)    Baseline  05/14/17 24/56; goal for LTG 26/56    Time  8    Period  Weeks    Status  New      PT LONG TERM GOAL #5   Title  Patient will improve 5x sit to stand to <=15 seconds (with use of UEs) to demonstrate improved LE strength and balance.    Baseline  05/14/17 34.16 seconds; goal revised to <30 seconds (?accuracy of measure on evaluation?)    Time  8    Period  Weeks    Status  Revised            Plan - 05/17/17 1801    Clinical Impression Statement  Session included balance and gait training. Patient continues to improve his upright posture and maintaining right knee extension in stance (his painful, weaker leg). Balance training done with bil feet on the floor due to fear of buckling (either knee--which has been witnessed by this PT). [facility] and practiced transfer on/off stepper with pt able to position himself modified independent except for reaching the handles for hands initially. Patient and wife pleased with pt;s progress and incr tolerance to activity.     Rehab Potential  Good    Clinical Impairments Affecting Rehab Potential  rt knee pain    PT Frequency  2x / week    PT Duration  8 weeks    PT Treatment/Interventions  ADLs/Self Care Home Management;Gait training;DME Instruction;Functional mobility training;Therapeutic activities;Therapeutic exercise;Balance training;Neuromuscular re-education;Cognitive remediation;Patient/family education;Orthotic Fit/Training    PT Next Visit Plan  balance training (bil feet must be on the floor due to rt knee pain and buckling--in // bars on beam, on rocker board, on blue airex); Gait training with focus on right heel down in stance, upright posture, and tolerance (goal is 300 ft and walking out to his car); can also use Nustep for endurance;  exercises for bil LEs;     PT Home Exercise Plan  SLR; Supine/seated marching; Supine/seated hip abdct; Bridges; Standing at sink (EO 30 sec, EC 30 sec); Tandem stance; Walk with walker every other day (180 ft); LEs to "walk" w/c around house.     Consulted and Agree with Plan of Care  Patient;Family member/caregiver    Family Member Consulted  spouse       Patient will benefit from skilled therapeutic intervention in order to improve the following deficits and impairments:  Abnormal gait, Decreased activity tolerance, Decreased balance, Decreased cognition, Decreased mobility, Decreased range of motion, Difficulty walking, Impaired sensation, Postural dysfunction, Pain  Visit Diagnosis: Muscle weakness (generalized)  Other abnormalities of gait and mobility  Unsteadiness on feet     Problem List Patient Active Problem List   Diagnosis Date Noted  . TIA (transient ischemic attack)   . Presence of permanent cardiac pacemaker   . Hypertension   . Hyperlipidemia   . Coronary artery disease   . Basal cell carcinoma   . Arthritis   . Pressure injury of skin 01/08/2017  . Stroke ([place]) 01/07/2017  . Ischemic stroke ([place])   .  Second degree Mobitz II AV block 10/23/2016  . Seizures ([place]) 10/09/2016  . Healthcare maintenance 10/01/2016  . Restlessness and agitation 07/02/2016  . Vascular dementia with behavior disturbance 06/29/2016  . Hemiparesis and other late effects of cerebrovascular accident ([place]) 06/29/2016  . Embolic stroke ([place]) 94/94/4739  . Right hemiparesis ([place])   . History of CVA with residual deficit   . Chronic anticoagulation   . Atrial fibrillation with rapid ventricular response ([place])   . Seizure prophylaxis   . Diastolic dysfunction   . Bacteremia due to Gram-positive bacteria   . Altered mental status   . Dementia without behavioral disturbance   . Leukocytosis   . Acute blood loss anemia   . Acute encephalopathy 06/04/2016  . PAF (paroxysmal atrial  fibrillation) ([place])   . Iron deficiency anemia 04/04/2016  . History of stroke 04/04/2016  . Cerebral hemorrhage (HCC) w/ SDH s/p IV tPA   . Coronary artery disease involving native coronary artery of native heart without angina pectoris   . Orthostatic hypotension   . History of right hip replacement   . Acute ischemic stroke ([place]) - L temporal lobe s/p tPA 03/30/2016  . BPH (benign prostatic hyperplasia) 11/25/2015  . GERD (gastroesophageal reflux disease) 11/25/2015  . CKD (chronic kidney disease), stage II 11/25/2015  . Overactive bladder   . Cerebrovascular accident (CVA) ([place]) 09/18/2015  . Gait disturbance, post-stroke 06/23/2015  . 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 Thalamic infarction ([place]) 06/21/2015  . Benign essential HTN   . Type 2 diabetes mellitus with complication, without long-term current use of insulin ([place])   . Dysphagia as late effect of cerebrovascular disease   . Hyponatremia   . HLD (hyperlipidemia)   . COPD (chronic obstructive pulmonary disease) ([place]) 03/09/2011    Rexanne Mano, PT 05/17/2017, 6:11 PM  [facility] [address] [place], [place], [address] Phone: [phone]   Fax:  [phone]  Name: TERRENCE WISHON MRN: [id number] Date of Birth: 1928-02-03

## 2017-05-21 ENCOUNTER — Ambulatory Visit: Payer: Medicare Other | Admitting: Physical Therapy

## 2017-05-21 ENCOUNTER — Encounter: Payer: Self-pay | Admitting: Physical Therapy

## 2017-05-21 DIAGNOSIS — M6281 Muscle weakness (generalized): Secondary | ICD-10-CM | POA: Diagnosis not present

## 2017-05-21 DIAGNOSIS — R2689 Other abnormalities of gait and mobility: Secondary | ICD-10-CM

## 2017-05-21 DIAGNOSIS — R2681 Unsteadiness on feet: Secondary | ICD-10-CM | POA: Diagnosis not present

## 2017-05-21 NOTE — Therapy (Signed)
[facility] [address] [place], [place], [address] Phone: [phone]   Fax:  [phone]  Physical Therapy Treatment  Patient Details  Name: Herbert Marquez MRN: [id number] Date of Birth: 1927-12-21 Referring Provider: Charlett Blake, MD   Encounter Date: 05/21/2017  PT End of Session - 05/21/17 2003    Visit Number  9    Number of Visits  17    Date for PT Re-Evaluation  06/15/17    Authorization Type  [employer] medicare    Authorization Time Period  04/16/17 to 06/15/17    PT Start Time  1315    PT Stop Time  1355    PT Time Calculation (min)  40 min    Equipment Utilized During Treatment  Gait belt    Activity Tolerance  Patient tolerated treatment well    Behavior During Therapy  [facility] for tasks assessed/performed       Past Medical History:  Diagnosis Date  . Arthritis    "pretty much all over"   . Basal cell carcinoma    "several burned off his body, face, head"  . BPH (benign prostatic hypertrophy)   . Coronary artery disease    a. s/p PCI of RCA in 2006  . CVA (cerebral infarction)    a. 06/2015: left thalamic and bilateral PCA  . GERD (gastroesophageal reflux disease)   . Hyperlipidemia   . Hypertension   . Presence of permanent cardiac pacemaker   . Seizures ([place])   . Stroke ([place])   . TIA (transient ischemic attack)    Approximately 6 weeks post-cardiac catheterization.     Past Surgical History:  Procedure Laterality Date  . CARDIOVASCULAR STRESS TEST  07/01/2007   EF 74%  . CATARACT EXTRACTION, BILATERAL    . CORONARY ANGIOPLASTY WITH STENT PLACEMENT  10/2004   stenting x 2 to RCA  . FEMUR IM NAIL Right 11/26/2015   Procedure: INTRAMEDULLARY RIGHT (IM) NAIL FEMORAL;  Surgeon: Rod Can, MD;  [facility];  Service: Orthopedics;  Laterality: Right;  . FRACTURE SURGERY    . HERNIA REPAIR    . HIP ARTHROPLASTY  03/09/2011   Procedure: ARTHROPLASTY BIPOLAR HIP;  Surgeon: Mauri Pole;  [facility];  Service: Orthopedics;  Laterality: Left;  . INSERT / REPLACE / REMOVE PACEMAKER  10/23/2016  . LAPAROSCOPIC INCISIONAL / UMBILICAL / VENTRAL HERNIA REPAIR     "below his naval"  . PACEMAKER IMPLANT N/A 10/23/2016   Procedure: Pacemaker Implant;  Surgeon: Thompson Grayer, MD;  Location: [facility];  Service: Cardiovascular;  Laterality: N/A;    There were no vitals filed for this visit.  Subjective Assessment - 05/21/17 1317    Subjective  I've been having dizziness off/on--usually in the morning. Gets better as the day goes on. Going to family party mid-April and needs to be able to climb 4-5 steps with rail.     Patient is accompained by:  Family member    Pertinent History  L THR, R hip fracture, h/o multiple CVAs, vascular dementia.    Patient Stated Goals  Want to be walking better. Walking more in the home (not using wheelchair). 05/14/17 added wants to be able to walk from parking lot into building and out again at end of session    Currently in Pain?  No/denies                      [facility] Adult  PT Treatment/Exercise - 05/21/17 1325      Transfers   Transfers  Sit to Stand;Stand to Sit    Sit to Stand  6: Modified independent (Device/Increase time)      Ambulation/Gait   Ambulation/Gait Assistance  5: Supervision    Ambulation/Gait Assistance Details  vc for upright posture and rt heel down    Ambulation Distance (Feet)  100 Feet 30, 30, 15    Assistive device  Rolling walker    Gait Pattern  Step-through pattern;Decreased stance time - right;Decreased stride length;Decreased dorsiflexion - right;Right flexed knee in stance;Trunk flexed wt bears on rt forefoot with heel  up    Ambulation Surface  Indoor    Stairs  Yes    Stairs Assistance  4: Min assist    Stairs Assistance Details (indicate cue type and reason)  guarding assist to ascend; min assist for balance and RLE stability as descends    Stair Management Technique  Two  rails;Step to pattern;Forwards    Number of Stairs  4 x 2    Height of Stairs  6      Knee/Hip Exercises: Aerobic   Nustep  3 min L2 with bil UEs and LES          Balance Exercises - 05/21/17 1339      Balance Exercises: Standing   SLS with Vectors  Upper extremity assist 2;Solid surface RLE tapping foam bubbles vs cone    Stepping Strategy  Lateral;UE support;10 reps to rt only due to rt knee buckles     Step Ups  Forward;6 inch;UE support 2 step taps full foot onto 6" step alternating          PT Short Term Goals - 05/14/17 1322      PT SHORT TERM GOAL #1   Title  The patient will perform HEP with assist from wife as needed for LE strengthening, standing balance and general mobility.  TARGET DATE FOR ALL STGs:  05/16/16    Baseline  05/14/17 met    Time  4    Period  Weeks    Status  Achieved      PT SHORT TERM GOAL #2   Title  Patient will complete Berg Balance Assessment with goal to be set as appropriate.     Baseline  Scored 18/56 on 04/26/17    Time  1    Period  Weeks    Status  Achieved      PT SHORT TERM GOAL #3   Title  Patient will complete gait velocity assessment with goal to be set as appropriate.     Baseline  04/26/17: 0.63 ft/sec with RW.    Time  1    Period  Weeks    Status  Achieved      PT SHORT TERM GOAL #4   Title  Patient will improve 5x sit to stand to <=17 sec (with use of hands) to demonstrate improved LE strength and balance.     Time  4    Period  Weeks    Status  Not Met        PT Long Term Goals - 05/14/17 1329      PT LONG TERM GOAL #1   Title  The patient will perform updated HEP with assist from spouse. (Target for LTGs 06/15/17)    Time  8    Period  Weeks    Status  New      PT LONG TERM GOAL #  2   Title  Patient will ambulate 300 ft without seated rest break with supervision to improve his ability and confidence to ambulate in his home.    Time  8    Period  Weeks    Status  New      PT LONG TERM GOAL #3   Title   Patient will improve gait velocity to ________ to demonstrate decreasing fall risk. (goal to be set based on level at 4 week/STG assessment)    Baseline  05/14/17 1.03 ft/sec with goal set for 1.30 ft/sec     Time  8    Period  Weeks    Status  New      PT LONG TERM GOAL #4   Title  Patient will improve Berg score to ________ to demonstrate decreasing fall risk. (goal to be set based on level at 4 week/STG assessment)    Baseline  05/14/17 24/56; goal for LTG 26/56    Time  8    Period  Weeks    Status  New      PT LONG TERM GOAL #5   Title  Patient will improve 5x sit to stand to <=15 seconds (with use of UEs) to demonstrate improved LE strength and balance.    Baseline  05/14/17 34.16 seconds; goal revised to <30 seconds (?accuracy of measure on evaluation?)    Time  8    Period  Weeks    Status  Revised            Plan - 05/21/17 2004    Clinical Impression Statement  Session included balance, strength, and gait training. Balance activities completed with bil LE for BOS or unilateral stance on LLE (pt cannot stand unilaterally on RLE due to knee pain and buckling) while lessening UE support and adding head turns. Gait continues to focus on rt heel strike (or even foot flat) as opposed to walking on his forefoot with heel raised and knee flexed--increasing risk of buckling. Stair training with bil rails and min assist. Patient continues to make slow progress--requires frequent rest breaks due to dyspnea/anxiety and rt knee pain.     Rehab Potential  Good    Clinical Impairments Affecting Rehab Potential  rt knee pain    PT Frequency  2x / week    PT Duration  8 weeks    PT Treatment/Interventions  ADLs/Self Care Home Management;Gait training;DME Instruction;Functional mobility training;Therapeutic activities;Therapeutic exercise;Balance training;Neuromuscular re-education;Cognitive remediation;Patient/family education;Orthotic Fit/Training    PT Next Visit Plan  balance training (bil  feet must be on the floor due to rt knee pain and buckling--in // bars on beam, on rocker board, on blue airex); sit to stand on blue airex; Gait training with focus on right heel down in stance and tolerance (goal is 300 ft and walking out to his car); up/down 4 steps with one rail for attending family event; can also use Nustep for endurance; exercises for bil LEs;     PT Home Exercise Plan  SLR; Supine/seated marching; Supine/seated hip abdct; Bridges; Standing at sink (EO 30 sec, EC 30 sec); Tandem stance; Walk with walker every other day (180 ft); LEs to "walk" w/c around house.     Consulted and Agree with Plan of Care  Patient;Family member/caregiver    Family Member Consulted  spouse       Patient will benefit from skilled therapeutic intervention in order to improve the following deficits and impairments:  Abnormal gait, Decreased activity tolerance, Decreased  balance, Decreased cognition, Decreased mobility, Decreased range of motion, Difficulty walking, Impaired sensation, Postural dysfunction, Pain  Visit Diagnosis: Muscle weakness (generalized)  Other abnormalities of gait and mobility     Problem List Patient Active Problem List   Diagnosis Date Noted  . TIA (transient ischemic attack)   . Presence of permanent cardiac pacemaker   . Hypertension   . Hyperlipidemia   . Coronary artery disease   . Basal cell carcinoma   . Arthritis   . Pressure injury of skin 01/08/2017  . Stroke ([place]) 01/07/2017  . Ischemic stroke ([place])   . Second degree Mobitz II AV block 10/23/2016  . Seizures ([place]) 10/09/2016  . Healthcare maintenance 10/01/2016  . Restlessness and agitation 07/02/2016  . Vascular dementia with behavior disturbance 06/29/2016  . Hemiparesis and other late effects of cerebrovascular accident ([place]) 06/29/2016  . Embolic stroke ([place]) 62/19/4712  . Right hemiparesis ([place])   . History of CVA with residual deficit   . Chronic anticoagulation   . Atrial fibrillation  with rapid ventricular response ([place])   . Seizure prophylaxis   . Diastolic dysfunction   . Bacteremia due to Gram-positive bacteria   . Altered mental status   . Dementia without behavioral disturbance   . Leukocytosis   . Acute blood loss anemia   . Acute encephalopathy 06/04/2016  . PAF (paroxysmal atrial fibrillation) ([place])   . Iron deficiency anemia 04/04/2016  . History of stroke 04/04/2016  . Cerebral hemorrhage (HCC) w/ SDH s/p IV tPA   . Coronary artery disease involving native coronary artery of native heart without angina pectoris   . Orthostatic hypotension   . History of right hip replacement   . Acute ischemic stroke ([place]) - L temporal lobe s/p tPA 03/30/2016  . BPH (benign prostatic hyperplasia) 11/25/2015  . GERD (gastroesophageal reflux disease) 11/25/2015  . CKD (chronic kidney disease), stage II 11/25/2015  . Overactive bladder   . Cerebrovascular accident (CVA) ([place]) 09/18/2015  . Gait disturbance, post-stroke 06/23/2015  . 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 Thalamic infarction ([place]) 06/21/2015  . Benign essential HTN   . Type 2 diabetes mellitus with complication, without long-term current use of insulin ([place])   . Dysphagia as late effect of cerebrovascular disease   . Hyponatremia   . HLD (hyperlipidemia)   . COPD (chronic obstructive pulmonary disease) ([place]) 03/09/2011    Herbert Marquez, PT 05/21/2017, 8:13 PM  [facility] [address] [place], [place], [address] Phone: [phone]   Fax:  [phone]  Name: Herbert Marquez MRN: [id number] Date of Birth: Oct 08, 1927

## 2017-05-24 ENCOUNTER — Ambulatory Visit: Payer: Medicare Other | Admitting: Physical Therapy

## 2017-05-24 ENCOUNTER — Encounter: Payer: Self-pay | Admitting: Physical Therapy

## 2017-05-24 DIAGNOSIS — R2681 Unsteadiness on feet: Secondary | ICD-10-CM | POA: Diagnosis not present

## 2017-05-24 DIAGNOSIS — M6281 Muscle weakness (generalized): Secondary | ICD-10-CM

## 2017-05-24 DIAGNOSIS — R2689 Other abnormalities of gait and mobility: Secondary | ICD-10-CM

## 2017-05-25 ENCOUNTER — Other Ambulatory Visit: Payer: Self-pay | Admitting: Physical Medicine & Rehabilitation

## 2017-05-25 NOTE — Therapy (Signed)
[facility] [address] [place], [place], [address] Phone: [phone]   Fax:  [phone]  Physical Therapy Treatment  Patient Details  Name: Herbert Marquez MRN: [id number] Date of Birth: 1927/12/08 Referring Provider: Charlett Blake, MD   Encounter Date: 05/24/2017  PT End of Session - 05/24/17 1700    Visit Number  10    Number of Visits  17    Date for PT Re-Evaluation  06/15/17    Authorization Type  [employer] medicare    Authorization Time Period  04/16/17 to 06/15/17    PT Start Time  1148    PT Stop Time  1230    PT Time Calculation (min)  42 min    Equipment Utilized During Treatment  Gait belt    Activity Tolerance  Patient tolerated treatment well    Behavior During Therapy  [facility] for tasks assessed/performed       Past Medical History:  Diagnosis Date  . Arthritis    "pretty much all over"   . Basal cell carcinoma    "several burned off his body, face, head"  . BPH (benign prostatic hypertrophy)   . Coronary artery disease    a. s/p PCI of RCA in 2006  . CVA (cerebral infarction)    a. 06/2015: left thalamic and bilateral PCA  . GERD (gastroesophageal reflux disease)   . Hyperlipidemia   . Hypertension   . Presence of permanent cardiac pacemaker   . Seizures ([place])   . Stroke ([place])   . TIA (transient ischemic attack)    Approximately 6 weeks post-cardiac catheterization.     Past Surgical History:  Procedure Laterality Date  . CARDIOVASCULAR STRESS TEST  07/01/2007   EF 74%  . CATARACT EXTRACTION, BILATERAL    . CORONARY ANGIOPLASTY WITH STENT PLACEMENT  10/2004   stenting x 2 to RCA  . FEMUR IM NAIL Right 11/26/2015   Procedure: INTRAMEDULLARY RIGHT (IM) NAIL FEMORAL;  Surgeon: Rod Can, MD;  Location: [facility];  Service: Orthopedics;  Laterality: Right;  . FRACTURE SURGERY    . HERNIA REPAIR    . HIP ARTHROPLASTY  03/09/2011   Procedure: ARTHROPLASTY BIPOLAR HIP;  Surgeon: Mauri Pole;  Location: [facility];  Service: Orthopedics;  Laterality: Left;  . INSERT / REPLACE / REMOVE PACEMAKER  10/23/2016  . LAPAROSCOPIC INCISIONAL / UMBILICAL / VENTRAL HERNIA REPAIR     "below his naval"  . PACEMAKER IMPLANT N/A 10/23/2016   Procedure: Pacemaker Implant;  Surgeon: Thompson Grayer, MD;  Location: [facility];  Service: Cardiovascular;  Laterality: N/A;    There were no vitals filed for this visit.  Subjective Assessment - 05/24/17 1151    Subjective  Last night his rt arm was really bothering him (has ever since his stroke). Continues to use his wheelchair as his primary means of locomotion in the house (but is walking some every day).     Patient is accompained by:  Family member    Pertinent History  L THR, R hip fracture, h/o multiple CVAs, vascular dementia.    Patient Stated Goals  Want to be walking better. Walking more in the home (not using wheelchair). 05/14/17 added wants to be able to walk from parking lot into building and out again at end of session    Currently in Pain?  Yes    Pain Score  -- pt unable to rate "not so bad right now"    Pain  Location  Arm    Pain Orientation  Right    Pain Descriptors / Indicators  Aching    Pain Type  Chronic pain    Pain Onset  More than a month ago    Pain Frequency  Intermittent                       [facility] Adult PT Treatment/Exercise - 05/24/17 0001      Transfers   Sit to Stand  4: Min guard    Sit to Stand Details (indicate cue type and reason)  on blue airex with bl UE support/armrests x 5 x 2 sets    Stand to Sit  5: Supervision      Ambulation/Gait   Ambulation/Gait Assistance  5: Supervision    Ambulation/Gait Assistance Details  vc for step length and rt heelstike    Ambulation Distance (Feet)  100 Feet 60, 60,     Assistive device  Rolling walker    Gait Pattern  Step-through pattern;Decreased stance time - right;Decreased stride length;Decreased dorsiflexion - right;Right flexed knee in  stance;Trunk flexed    Ambulation Surface  Indoor      Knee/Hip Exercises: Standing   Forward Step Up  Right;2 sets;10 reps;Hand Hold: 2;Step Height: 2" bil UE on RW          Balance Exercises - 05/24/17 1206      Balance Exercises: Standing   Rockerboard  Anterior/posterior;Lateral;Head turns;EO;Intermittent UE support arm motions fwd and overhead;       Side-stepping at counter with limited Ue support x 4 lengths    PT Short Term Goals - 05/14/17 1322      PT SHORT TERM GOAL #1   Title  The patient will perform HEP with assist from wife as needed for LE strengthening, standing balance and general mobility.  TARGET DATE FOR ALL STGs:  05/16/16    Baseline  05/14/17 met    Time  4    Period  Weeks    Status  Achieved      PT SHORT TERM GOAL #2   Title  Patient will complete Berg Balance Assessment with goal to be set as appropriate.     Baseline  Scored 18/56 on 04/26/17    Time  1    Period  Weeks    Status  Achieved      PT SHORT TERM GOAL #3   Title  Patient will complete gait velocity assessment with goal to be set as appropriate.     Baseline  04/26/17: 0.63 ft/sec with RW.    Time  1    Period  Weeks    Status  Achieved      PT SHORT TERM GOAL #4   Title  Patient will improve 5x sit to stand to <=17 sec (with use of hands) to demonstrate improved LE strength and balance.     Time  4    Period  Weeks    Status  Not Met        PT Long Term Goals - 05/14/17 1329      PT LONG TERM GOAL #1   Title  The patient will perform updated HEP with assist from spouse. (Target for LTGs 06/15/17)    Time  8    Period  Weeks    Status  New      PT LONG TERM GOAL #2   Title  Patient will ambulate 300 ft without seated rest  break with supervision to improve his ability and confidence to ambulate in his home.    Time  8    Period  Weeks    Status  New      PT LONG TERM GOAL #3   Title  Patient will improve gait velocity to ________ to demonstrate decreasing fall risk.  (goal to be set based on level at 4 week/STG assessment)    Baseline  05/14/17 1.03 ft/sec with goal set for 1.30 ft/sec     Time  8    Period  Weeks    Status  New      PT LONG TERM GOAL #4   Title  Patient will improve Berg score to ________ to demonstrate decreasing fall risk. (goal to be set based on level at 4 week/STG assessment)    Baseline  05/14/17 24/56; goal for LTG 26/56    Time  8    Period  Weeks    Status  New      PT LONG TERM GOAL #5   Title  Patient will improve 5x sit to stand to <=15 seconds (with use of UEs) to demonstrate improved LE strength and balance.    Baseline  05/14/17 34.16 seconds; goal revised to <30 seconds (?accuracy of measure on evaluation?)    Time  8    Period  Weeks    Status  Revised            Plan - 05/24/17 1700    Clinical Impression Statement  Session included balance, strength, and gait training. Again focused on gait with rt foot heelstrike through foot flat which led to discuss (again) benefits of shoe modification ("shoe lift" or insert). By the end of the session, pt expressed interest in trialing a shoe insert/heel lift. Will attempt next session    Rehab Potential  Good    Clinical Impairments Affecting Rehab Potential  rt knee pain    PT Frequency  2x / week    PT Duration  8 weeks    PT Treatment/Interventions  ADLs/Self Care Home Management;Gait training;DME Instruction;Functional mobility training;Therapeutic activities;Therapeutic exercise;Balance training;Neuromuscular re-education;Cognitive remediation;Patient/family education;Orthotic Fit/Training    PT Next Visit Plan  try 1/2" heel lift in right shoe for improved heel contact in gait and standing; gait with goal 300 ft and walking out to his car; balance training (cannot do Rt SLS due to rt knee pain and buckling) in // bars on beam, on rocker board, on blue airex); sit to stand on blue airex; up/down 4 steps with one rail for attending family event; can also use Nustep for  endurance; exercises for bil LEs;     PT Home Exercise Plan  SLR; Supine/seated marching; Supine/seated hip abdct; Bridges; Standing at sink (EO 30 sec, EC 30 sec); Tandem stance; Walk with walker every other day (180 ft); LEs to "walk" w/c around house.     Consulted and Agree with Plan of Care  Patient;Family member/caregiver    Family Member Consulted  spouse       Patient will benefit from skilled therapeutic intervention in order to improve the following deficits and impairments:  Abnormal gait, Decreased activity tolerance, Decreased balance, Decreased cognition, Decreased mobility, Decreased range of motion, Difficulty walking, Impaired sensation, Postural dysfunction, Pain  Visit Diagnosis: Muscle weakness (generalized)  Other abnormalities of gait and mobility  Unsteadiness on feet     Problem List Patient Active Problem List   Diagnosis Date Noted  . TIA (transient ischemic attack)   .  Presence of permanent cardiac pacemaker   . Hypertension   . Hyperlipidemia   . Coronary artery disease   . Basal cell carcinoma   . Arthritis   . Pressure injury of skin 01/08/2017  . Stroke ([place]) 01/07/2017  . Ischemic stroke ([place])   . Second degree Mobitz II AV block 10/23/2016  . Seizures ([place]) 10/09/2016  . Healthcare maintenance 10/01/2016  . Restlessness and agitation 07/02/2016  . Vascular dementia with behavior disturbance 06/29/2016  . Hemiparesis and other late effects of cerebrovascular accident ([facility]) 06/29/2016  . Embolic stroke ([place]) 21/30/8657  . Right hemiparesis ([place])   . History of CVA with residual deficit   . Chronic anticoagulation   . Atrial fibrillation with rapid ventricular response ([place])   . Seizure prophylaxis   . Diastolic dysfunction   . Bacteremia due to Gram-positive bacteria   . Altered mental status   . Dementia without behavioral disturbance   . Leukocytosis   . Acute blood loss anemia   . Acute encephalopathy 06/04/2016  . PAF (paroxysmal  atrial fibrillation) ([place])   . Iron deficiency anemia 04/04/2016  . History of stroke 04/04/2016  . Cerebral hemorrhage (HCC) w/ SDH s/p IV tPA   . Coronary artery disease involving native coronary artery of native heart without angina pectoris   . Orthostatic hypotension   . History of right hip replacement   . Acute ischemic stroke ([place]) - L temporal lobe s/p tPA 03/30/2016  . BPH (benign prostatic hyperplasia) 11/25/2015  . GERD (gastroesophageal reflux disease) 11/25/2015  . CKD (chronic kidney disease), stage II 11/25/2015  . Overactive bladder   . Cerebrovascular accident (CVA) ([place]) 09/18/2015  . Gait disturbance, post-stroke 06/23/2015  . 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 Thalamic infarction ([place]) 06/21/2015  . Benign essential HTN   . Type 2 diabetes mellitus with complication, without long-term current use of insulin ([place])   . Dysphagia as late effect of cerebrovascular disease   . Hyponatremia   . HLD (hyperlipidemia)   . COPD (chronic obstructive pulmonary disease) ([place]) 03/09/2011    Rexanne Mano, PT 05/25/2017, 7:03 AM  [facility] [address] [place], [place], [address] Phone: [phone]   Fax:  [phone]  Name: CORRELL DENBOW MRN: [id number] Date of Birth: Jul 31, 1927

## 2017-05-27 NOTE — Telephone Encounter (Signed)
Recieved electronic medication refill request for Quetiapine, no mention in last few notes to use or continue to use this medication.   Please advise.

## 2017-05-27 NOTE — Telephone Encounter (Signed)
Have patient call primary to start prescribing this

## 2017-05-28 ENCOUNTER — Encounter: Payer: Self-pay | Admitting: Physical Therapy

## 2017-05-28 ENCOUNTER — Ambulatory Visit: Payer: Medicare Other | Admitting: Physical Therapy

## 2017-05-28 DIAGNOSIS — R2689 Other abnormalities of gait and mobility: Secondary | ICD-10-CM | POA: Diagnosis not present

## 2017-05-28 DIAGNOSIS — R2681 Unsteadiness on feet: Secondary | ICD-10-CM | POA: Diagnosis not present

## 2017-05-28 DIAGNOSIS — M6281 Muscle weakness (generalized): Secondary | ICD-10-CM | POA: Diagnosis not present

## 2017-05-28 NOTE — Telephone Encounter (Signed)
Forwarding to primary

## 2017-05-28 NOTE — Therapy (Signed)
[facility] [address] [place], [place], [address] Phone: [phone]   Fax:  [phone]  Physical Therapy Treatment  Patient Details  Name: Herbert Marquez MRN: [id number] Date of Birth: 06/22/1927 Referring Provider: Charlett Blake, MD   Encounter Date: 05/28/2017  PT End of Session - 05/28/17 1807    Visit Number  11    Number of Visits  17    Date for PT Re-Evaluation  06/15/17    Authorization Type  [employer] medicare    Authorization Time Period  04/16/17 to 06/15/17    PT Start Time  1315    PT Stop Time  1400    PT Time Calculation (min)  45 min    Equipment Utilized During Treatment  Gait belt    Activity Tolerance  Patient tolerated treatment well    Behavior During Therapy  [facility] for tasks assessed/performed       Past Medical History:  Diagnosis Date  . Arthritis    "pretty much all over"   . Basal cell carcinoma    "several burned off his body, face, head"  . BPH (benign prostatic hypertrophy)   . Coronary artery disease    a. s/p PCI of RCA in 2006  . CVA (cerebral infarction)    a. 06/2015: left thalamic and bilateral PCA  . GERD (gastroesophageal reflux disease)   . Hyperlipidemia   . Hypertension   . Presence of permanent cardiac pacemaker   . Seizures ([place])   . Stroke ([place])   . TIA (transient ischemic attack)    Approximately 6 weeks post-cardiac catheterization.     Past Surgical History:  Procedure Laterality Date  . CARDIOVASCULAR STRESS TEST  07/01/2007   EF 74%  . CATARACT EXTRACTION, BILATERAL    . CORONARY ANGIOPLASTY WITH STENT PLACEMENT  10/2004   stenting x 2 to RCA  . FEMUR IM NAIL Right 11/26/2015   Procedure: INTRAMEDULLARY RIGHT (IM) NAIL FEMORAL;  Surgeon: Rod Can, MD;  [facility];  Service: Orthopedics;  Laterality: Right;  . FRACTURE SURGERY    . HERNIA REPAIR    . HIP ARTHROPLASTY  03/09/2011   Procedure: ARTHROPLASTY BIPOLAR HIP;  Surgeon: Mauri Pole;  [facility];  Service: Orthopedics;  Laterality: Left;  . INSERT / REPLACE / REMOVE PACEMAKER  10/23/2016  . LAPAROSCOPIC INCISIONAL / UMBILICAL / VENTRAL HERNIA REPAIR     "below his naval"  . PACEMAKER IMPLANT N/A 10/23/2016   Procedure: Pacemaker Implant;  Surgeon: Thompson Grayer, MD;  Location: [facility];  Service: Cardiovascular;  Laterality: N/A;    There were no vitals filed for this visit.  Subjective Assessment - 05/28/17 1318    Subjective  Did all my exercises this morning and I'm tired.     Patient is accompained by:  Family member    Pertinent History  L THR, R hip fracture, h/o multiple CVAs, vascular dementia.    Patient Stated Goals  Want to be walking better. Walking more in the home (not using wheelchair). 05/14/17 added wants to be able to walk from parking lot into building and out again at end of session    Currently in Pain?  No/denies    Pain Onset  More than a month ago                       [facility] Adult PT Treatment/Exercise - 05/28/17 1342  Transfers   Sit to Stand  4: Min guard    Sit to Stand Details (indicate cue type and reason)  occasional cues for sequence to keep him from pressing legs against surface (causing chair to tip or scoot)    Stand to Sit  5: Supervision;4: Min guard    Stand to Sit Details  twice when fatigued he began to sit prior to fully completing turn to back up to chair. Emphasized the strain this puts on his knees and he was more cautios remainder of session.      Ambulation/Gait   Ambulation/Gait Assistance  5: Supervision    Ambulation/Gait Assistance Details  utilized 1/2" heel wedge inside rt shoe with improved rt heel contact; noted in pre-gait activities he continued to lift heel and stand on rt forefoot and he reports he can barely feel his right foot and really doesnt' know what it is doing    Ambulation Distance (Feet)  100 Feet 115, 100, 100    Assistive device  Rolling walker    Gait  Pattern  Step-through pattern;Decreased stance time - right;Decreased stride length;Decreased dorsiflexion - right;Right flexed knee in stance;Trunk flexed    Ambulation Surface  Indoor    Pre-Gait Activities  in // bars, upright posture with practice advancing RLE with hip/knee flexion to rt heelstrike to foot flat      Knee/Hip Exercises: Standing   Forward Step Up  Right;2 sets;10 reps;Step Height: 2" bil UE on RW    Other Standing Knee Exercises  sidestepping along counter; pt uses incr weight thru UEs due to tendency for rt knee to buckle          Balance Exercises - 05/28/17 1806      Balance Exercises: Standing   Standing Eyes Opened  Wide (BOA);Foam/compliant surface bil UE support to no support          PT Short Term Goals - 05/14/17 1322      PT SHORT TERM GOAL #1   Title  The patient will perform HEP with assist from wife as needed for LE strengthening, standing balance and general mobility.  TARGET DATE FOR ALL STGs:  05/16/16    Baseline  05/14/17 met    Time  4    Period  Weeks    Status  Achieved      PT SHORT TERM GOAL #2   Title  Patient will complete Berg Balance Assessment with goal to be set as appropriate.     Baseline  Scored 18/56 on 04/26/17    Time  1    Period  Weeks    Status  Achieved      PT SHORT TERM GOAL #3   Title  Patient will complete gait velocity assessment with goal to be set as appropriate.     Baseline  04/26/17: 0.63 ft/sec with RW.    Time  1    Period  Weeks    Status  Achieved      PT SHORT TERM GOAL #4   Title  Patient will improve 5x sit to stand to <=17 sec (with use of hands) to demonstrate improved LE strength and balance.     Time  4    Period  Weeks    Status  Not Met        PT Long Term Goals - 05/14/17 1329      PT LONG TERM GOAL #1   Title  The patient will perform updated HEP with assist  from spouse. (Target for LTGs 06/15/17)    Time  8    Period  Weeks    Status  New      PT LONG TERM GOAL #2   Title   Patient will ambulate 300 ft without seated rest break with supervision to improve his ability and confidence to ambulate in his home.    Time  8    Period  Weeks    Status  New      PT LONG TERM GOAL #3   Title  Patient will improve gait velocity to ________ to demonstrate decreasing fall risk. (goal to be set based on level at 4 week/STG assessment)    Baseline  05/14/17 1.03 ft/sec with goal set for 1.30 ft/sec     Time  8    Period  Weeks    Status  New      PT LONG TERM GOAL #4   Title  Patient will improve Berg score to ________ to demonstrate decreasing fall risk. (goal to be set based on level at 4 week/STG assessment)    Baseline  05/14/17 24/56; goal for LTG 26/56    Time  8    Period  Weeks    Status  New      PT LONG TERM GOAL #5   Title  Patient will improve 5x sit to stand to <=15 seconds (with use of UEs) to demonstrate improved LE strength and balance.    Baseline  05/14/17 34.16 seconds; goal revised to <30 seconds (?accuracy of measure on evaluation?)    Time  8    Period  Weeks    Status  Revised            Plan - 05/28/17 1808    Clinical Impression Statement  Gait improved with use of two 1/4" heel lifts inside rt shoe (to equal 1/2") as pt with rt heel incontact with the floor better and through more of the gait cycle. Continue to work on LE strengthening and balance to improve his safety with mobility. Patient thinks he has some heel lifts at home and plans to try to find them.     Rehab Potential  Good    Clinical Impairments Affecting Rehab Potential  rt knee pain    PT Frequency  2x / week    PT Duration  8 weeks    PT Treatment/Interventions  ADLs/Self Care Home Management;Gait training;DME Instruction;Functional mobility training;Therapeutic activities;Therapeutic exercise;Balance training;Neuromuscular re-education;Cognitive remediation;Patient/family education;Orthotic Fit/Training    PT Next Visit Plan  try 1/2" heel lift in right shoe for improved  heel contact in gait and standing; gait with goal 300 ft and walking out to his car; balance training (cannot do Rt SLS due to rt knee pain and buckling) in // bars on beam, on rocker board, on blue airex); sit to stand on blue airex; up/down 4 steps with one rail for attending family event; can also use Nustep for endurance; exercises for bil LEs;     PT Home Exercise Plan  SLR; Supine/seated marching; Supine/seated hip abdct; Bridges; Standing at sink (EO 30 sec, EC 30 sec); Tandem stance; Walk with walker every other day (180 ft); LEs to "walk" w/c around house.     Consulted and Agree with Plan of Care  Patient;Family member/caregiver    Family Member Consulted  spouse       Patient will benefit from skilled therapeutic intervention in order to improve the following deficits and impairments:  Abnormal  gait, Decreased activity tolerance, Decreased balance, Decreased cognition, Decreased mobility, Decreased range of motion, Difficulty walking, Impaired sensation, Postural dysfunction, Pain  Visit Diagnosis: Muscle weakness (generalized)  Other abnormalities of gait and mobility  Unsteadiness on feet     Problem List Patient Active Problem List   Diagnosis Date Noted  . TIA (transient ischemic attack)   . Presence of permanent cardiac pacemaker   . Hypertension   . Hyperlipidemia   . Coronary artery disease   . Basal cell carcinoma   . Arthritis   . Pressure injury of skin 01/08/2017  . Stroke ([place]) 01/07/2017  . Ischemic stroke ([place])   . Second degree Mobitz II AV block 10/23/2016  . Seizures ([place]) 10/09/2016  . Healthcare maintenance 10/01/2016  . Restlessness and agitation 07/02/2016  . Vascular dementia with behavior disturbance 06/29/2016  . Hemiparesis and other late effects of cerebrovascular accident ([place]) 06/29/2016  . Embolic stroke ([place]) 53/66/4403  . Right hemiparesis ([place])   . History of CVA with residual deficit   . Chronic anticoagulation   . Atrial  fibrillation with rapid ventricular response ([place])   . Seizure prophylaxis   . Diastolic dysfunction   . Bacteremia due to Gram-positive bacteria   . Altered mental status   . Dementia without behavioral disturbance   . Leukocytosis   . Acute blood loss anemia   . Acute encephalopathy 06/04/2016  . PAF (paroxysmal atrial fibrillation) ([place])   . Iron deficiency anemia 04/04/2016  . History of stroke 04/04/2016  . Cerebral hemorrhage (HCC) w/ SDH s/p IV tPA   . Coronary artery disease involving native coronary artery of native heart without angina pectoris   . Orthostatic hypotension   . History of right hip replacement   . Acute ischemic stroke ([place]) - L temporal lobe s/p tPA 03/30/2016  . BPH (benign prostatic hyperplasia) 11/25/2015  . GERD (gastroesophageal reflux disease) 11/25/2015  . CKD (chronic kidney disease), stage II 11/25/2015  . Overactive bladder   . Cerebrovascular accident (CVA) ([facility]) 09/18/2015  . Gait disturbance, post-stroke 06/23/2015  . 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 Thalamic infarction ([place]) 06/21/2015  . Benign essential HTN   . Type 2 diabetes mellitus with complication, without long-term current use of insulin ([place])   . Dysphagia as late effect of cerebrovascular disease   . Hyponatremia   . HLD (hyperlipidemia)   . COPD (chronic obstructive pulmonary disease) ([place]) 03/09/2011    Rexanne Mano, PT 05/28/2017, 6:10 PM  [facility] [address] [place], [place], [address] Phone: [phone]   Fax:  [phone]  Name: Herbert Marquez MRN: [id number] Date of Birth: 1927-08-05

## 2017-05-29 DIAGNOSIS — L570 Actinic keratosis: Secondary | ICD-10-CM | POA: Diagnosis not present

## 2017-05-29 DIAGNOSIS — C44219 Basal cell carcinoma of skin of left ear and external auricular canal: Secondary | ICD-10-CM | POA: Diagnosis not present

## 2017-05-29 DIAGNOSIS — Z85828 Personal history of other malignant neoplasm of skin: Secondary | ICD-10-CM | POA: Diagnosis not present

## 2017-05-29 DIAGNOSIS — C44319 Basal cell carcinoma of skin of other parts of face: Secondary | ICD-10-CM | POA: Diagnosis not present

## 2017-05-29 DIAGNOSIS — D485 Neoplasm of uncertain behavior of skin: Secondary | ICD-10-CM | POA: Diagnosis not present

## 2017-05-31 ENCOUNTER — Ambulatory Visit: Payer: Medicare Other | Admitting: Physical Therapy

## 2017-05-31 ENCOUNTER — Encounter: Payer: Self-pay | Admitting: Physical Therapy

## 2017-05-31 DIAGNOSIS — M6281 Muscle weakness (generalized): Secondary | ICD-10-CM | POA: Diagnosis not present

## 2017-05-31 DIAGNOSIS — R2689 Other abnormalities of gait and mobility: Secondary | ICD-10-CM | POA: Diagnosis not present

## 2017-05-31 DIAGNOSIS — R2681 Unsteadiness on feet: Secondary | ICD-10-CM | POA: Diagnosis not present

## 2017-05-31 NOTE — Therapy (Signed)
[facility] [address] [place], [place], [address] Phone: [phone]   Fax:  [phone]  Physical Therapy Treatment  Patient Details  Name: Herbert Marquez MRN: [id number] Date of Birth: 1927-12-22 Referring Provider: Charlett Blake, MD   Encounter Date: 05/31/2017  PT End of Session - 05/31/17 1311    Visit Number  12    Number of Visits  17    Date for PT Re-Evaluation  06/15/17    Authorization Type  [employer] medicare    Authorization Time Period  04/16/17 to 06/15/17    PT Start Time  1147    PT Stop Time  1227    PT Time Calculation (min)  40 min    Equipment Utilized During Treatment  Gait belt    Activity Tolerance  Patient tolerated treatment well    Behavior During Therapy  [facility] for tasks assessed/performed       Past Medical History:  Diagnosis Date  . Arthritis    "pretty much all over"   . Basal cell carcinoma    "several burned off his body, face, head"  . BPH (benign prostatic hypertrophy)   . Coronary artery disease    a. s/p PCI of RCA in 2006  . CVA (cerebral infarction)    a. 06/2015: left thalamic and bilateral PCA  . GERD (gastroesophageal reflux disease)   . Hyperlipidemia   . Hypertension   . Presence of permanent cardiac pacemaker   . Seizures ([place])   . Stroke ([place])   . TIA (transient ischemic attack)    Approximately 6 weeks post-cardiac catheterization.     Past Surgical History:  Procedure Laterality Date  . CARDIOVASCULAR STRESS TEST  07/01/2007   EF 74%  . CATARACT EXTRACTION, BILATERAL    . CORONARY ANGIOPLASTY WITH STENT PLACEMENT  10/2004   stenting x 2 to RCA  . FEMUR IM NAIL Right 11/26/2015   Procedure: INTRAMEDULLARY RIGHT (IM) NAIL FEMORAL;  Surgeon: Rod Can, MD;  Location: [facility];  Service: Orthopedics;  Laterality: Right;  . FRACTURE SURGERY    . HERNIA REPAIR    . HIP ARTHROPLASTY  03/09/2011   Procedure: ARTHROPLASTY BIPOLAR HIP;  Surgeon: Mauri Pole;  Location: [facility];  Service: Orthopedics;  Laterality: Left;  . INSERT / REPLACE / REMOVE PACEMAKER  10/23/2016  . LAPAROSCOPIC INCISIONAL / UMBILICAL / VENTRAL HERNIA REPAIR     "below his naval"  . PACEMAKER IMPLANT N/A 10/23/2016   Procedure: Pacemaker Implant;  Surgeon: Thompson Grayer, MD;  Location: [place] [facility];  Service: Cardiovascular;  Laterality: N/A;    There were no vitals filed for this visit.  Subjective Assessment - 05/31/17 1150    Subjective  Cut his rt forearm wiht his fingernail in his sleep.     Patient is accompained by:  Family member    Pertinent History  L THR, R hip fracture, h/o multiple CVAs, vascular dementia.    Patient Stated Goals  Want to be walking better. Walking more in the home (not using wheelchair). 05/14/17 added wants to be able to walk from parking lot into building and out again at end of session    Currently in Pain?  No/denies    Pain Onset  --                       [facility] Adult PT Treatment/Exercise - 05/31/17 1211      Transfers  Sit to Stand  4: Min guard    Sit to Stand Details (indicate cue type and reason)  frequent vc for scooting to edge of seat prior to stand (as he tends to push chair back/over when leans back of legs against chair)    Stand to Sit  5: Supervision;4: Min guard      Ambulation/Gait   Ambulation/Gait Assistance  5: Supervision    Ambulation/Gait Assistance Details  1/2" heel wedge in rt shoe    Ambulation Distance (Feet)  60 Feet 60, 90, 30    Assistive device  Rolling walker    Gait Pattern  Step-through pattern;Decreased stance time - right;Decreased stride length;Decreased dorsiflexion - right;Right flexed knee in stance;Trunk flexed    Ambulation Surface  Indoor    Stairs Assistance  4: Min guard    Stairs Assistance Details (indicate cue type and reason)  guarding for safety; vc for sequencing on descent    Stair Management Technique  Two rails;One rail Left;Step to  pattern;Forwards;With cane    Number of Stairs  4 twice; bil rails x 1; lt rail and rt cane x 1    Height of Stairs  6      Knee/Hip Exercises: Standing   Forward Step Up  Right;2 sets;10 reps;Step Height: 2";Left bil UE on RW    Other Standing Knee Exercises  backwards walking with RW; rt foot leads toe to heel with emphasis on getting heel down               PT Short Term Goals - 05/14/17 1322      PT SHORT TERM GOAL #1   Title  The patient will perform HEP with assist from wife as needed for LE strengthening, standing balance and general mobility.  TARGET DATE FOR ALL STGs:  05/16/16    Baseline  05/14/17 met    Time  4    Period  Weeks    Status  Achieved      PT SHORT TERM GOAL #2   Title  Patient will complete Berg Balance Assessment with goal to be set as appropriate.     Baseline  Scored 18/56 on 04/26/17    Time  1    Period  Weeks    Status  Achieved      PT SHORT TERM GOAL #3   Title  Patient will complete gait velocity assessment with goal to be set as appropriate.     Baseline  04/26/17: 0.63 ft/sec with RW.    Time  1    Period  Weeks    Status  Achieved      PT SHORT TERM GOAL #4   Title  Patient will improve 5x sit to stand to <=17 sec (with use of hands) to demonstrate improved LE strength and balance.     Time  4    Period  Weeks    Status  Not Met        PT Long Term Goals - 05/14/17 1329      PT LONG TERM GOAL #1   Title  The patient will perform updated HEP with assist from spouse. (Target for LTGs 06/15/17)    Time  8    Period  Weeks    Status  New      PT LONG TERM GOAL #2   Title  Patient will ambulate 300 ft without seated rest break with supervision to improve his ability and confidence to ambulate in his home.  Time  8    Period  Weeks    Status  New      PT LONG TERM GOAL #3   Title  Patient will improve gait velocity to ________ to demonstrate decreasing fall risk. (goal to be set based on level at 4 week/STG assessment)     Baseline  05/14/17 1.03 ft/sec with goal set for 1.30 ft/sec     Time  8    Period  Weeks    Status  New      PT LONG TERM GOAL #4   Title  Patient will improve Berg score to ________ to demonstrate decreasing fall risk. (goal to be set based on level at 4 week/STG assessment)    Baseline  05/14/17 24/56; goal for LTG 26/56    Time  8    Period  Weeks    Status  New      PT LONG TERM GOAL #5   Title  Patient will improve 5x sit to stand to <=15 seconds (with use of UEs) to demonstrate improved LE strength and balance.    Baseline  05/14/17 34.16 seconds; goal revised to <30 seconds (?accuracy of measure on evaluation?)    Time  8    Period  Weeks    Status  Revised            Plan - 05/31/17 1312    Clinical Impression Statement  Continued focus on gait training with rt heel down (using clinic's 1/2 inch heel lift--he did not find his at home; wife instructed in where to buy new one) and improved weight-shift over RLE with knee extended to prevent buckling. Improved performance on steps including with one rail and cane as they found out the family member's home has only one rail to enter. Overall pt can continue to benefit from OPPT to improve strength and safety with mobility.     Rehab Potential  Good    Clinical Impairments Affecting Rehab Potential  rt knee pain    PT Frequency  2x / week    PT Duration  8 weeks    PT Treatment/Interventions  ADLs/Self Care Home Management;Gait training;DME Instruction;Functional mobility training;Therapeutic activities;Therapeutic exercise;Balance training;Neuromuscular re-education;Cognitive remediation;Patient/family education;Orthotic Fit/Training    PT Next Visit Plan  if pt doesn't have his own, try 1/2" heel lift in right shoe for improved heel contact in gait and standing; gait with goal 300 ft and walking out to his car; balance training (cannot do Rt SLS due to rt knee pain and buckling) in // bars on beam, on rocker board, on blue airex);  sit to stand on blue airex; up/down 4 steps with one rail for attending family event; can also use Nustep for endurance; exercises for bil LEs;     PT Home Exercise Plan  SLR; Supine/seated marching; Supine/seated hip abdct; Bridges; Standing at sink (EO 30 sec, EC 30 sec); Tandem stance; Walk with walker every other day (180 ft); LEs to "walk" w/c around house.     Consulted and Agree with Plan of Care  Patient;Family member/caregiver    Family Member Consulted  spouse       Patient will benefit from skilled therapeutic intervention in order to improve the following deficits and impairments:  Abnormal gait, Decreased activity tolerance, Decreased balance, Decreased cognition, Decreased mobility, Decreased range of motion, Difficulty walking, Impaired sensation, Postural dysfunction, Pain  Visit Diagnosis: Muscle weakness (generalized)  Other abnormalities of gait and mobility  Unsteadiness on feet  Problem List Patient Active Problem List   Diagnosis Date Noted  . TIA (transient ischemic attack)   . Presence of permanent cardiac pacemaker   . Hypertension   . Hyperlipidemia   . Coronary artery disease   . Basal cell carcinoma   . Arthritis   . Pressure injury of skin 01/08/2017  . Stroke ([place]) 01/07/2017  . Ischemic stroke ([place])   . Second degree Mobitz II AV block 10/23/2016  . Seizures ([place]) 10/09/2016  . Healthcare maintenance 10/01/2016  . Restlessness and agitation 07/02/2016  . Vascular dementia with behavior disturbance 06/29/2016  . Hemiparesis and other late effects of cerebrovascular accident ([place]) 06/29/2016  . Embolic stroke ([place]) 27/05/5007  . Right hemiparesis ([place])   . History of CVA with residual deficit   . Chronic anticoagulation   . Atrial fibrillation with rapid ventricular response ([place])   . Seizure prophylaxis   . Diastolic dysfunction   . Bacteremia due to Gram-positive bacteria   . Altered mental status   . Dementia without behavioral  disturbance   . Leukocytosis   . Acute blood loss anemia   . Acute encephalopathy 06/04/2016  . PAF (paroxysmal atrial fibrillation) ([place])   . Iron deficiency anemia 04/04/2016  . History of stroke 04/04/2016  . Cerebral hemorrhage (HCC) w/ SDH s/p IV tPA   . Coronary artery disease involving native coronary artery of native heart without angina pectoris   . Orthostatic hypotension   . History of right hip replacement   . Acute ischemic stroke ([place]) - L temporal lobe s/p tPA 03/30/2016  . BPH (benign prostatic hyperplasia) 11/25/2015  . GERD (gastroesophageal reflux disease) 11/25/2015  . CKD (chronic kidney disease), stage II 11/25/2015  . Overactive bladder   . Cerebrovascular accident (CVA) ([place]) 09/18/2015  . Gait disturbance, post-stroke 06/23/2015  . 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 Thalamic infarction ([place]) 06/21/2015  . Benign essential HTN   . Type 2 diabetes mellitus with complication, without long-term current use of insulin ([place])   . Dysphagia as late effect of cerebrovascular disease   . Hyponatremia   . HLD (hyperlipidemia)   . COPD (chronic obstructive pulmonary disease) ([place]) 03/09/2011    Rexanne Mano, PT 05/31/2017, 1:18 PM  [facility] [address] [place], [place], [address] Phone: [phone]   Fax:  [phone]  Name: AURELIUS GILDERSLEEVE MRN: [id number] Date of Birth: 1928/03/05

## 2017-06-03 DIAGNOSIS — N400 Enlarged prostate without lower urinary tract symptoms: Secondary | ICD-10-CM | POA: Diagnosis not present

## 2017-06-04 ENCOUNTER — Ambulatory Visit: Payer: Medicare Other | Attending: Physical Medicine & Rehabilitation | Admitting: Physical Therapy

## 2017-06-04 ENCOUNTER — Encounter: Payer: Self-pay | Admitting: Physical Therapy

## 2017-06-04 DIAGNOSIS — M6281 Muscle weakness (generalized): Secondary | ICD-10-CM | POA: Insufficient documentation

## 2017-06-04 DIAGNOSIS — R2681 Unsteadiness on feet: Secondary | ICD-10-CM | POA: Diagnosis not present

## 2017-06-04 DIAGNOSIS — R2689 Other abnormalities of gait and mobility: Secondary | ICD-10-CM | POA: Insufficient documentation

## 2017-06-05 NOTE — Therapy (Signed)
[facility] [address] [place], [place], [address] Phone: [phone]   Fax:  [phone]  Physical Therapy Treatment  Patient Details  Name: Herbert Marquez MRN: [id number] Date of Birth: 12-Nov-1927 Referring Provider: Charlett Blake, MD   Encounter Date: 06/04/2017  PT End of Session - 06/04/17 1219    Visit Number  13    Number of Visits  17    Date for PT Re-Evaluation  06/15/17    Authorization Type  [employer] medicare    Authorization Time Period  04/16/17 to 06/15/17    PT Start Time  1150    PT Stop Time  1235    PT Time Calculation (min)  45 min    Equipment Utilized During Treatment  Gait belt    Activity Tolerance  Patient limited by fatigue several seated rest breaks    Behavior During Therapy  WFL for tasks assessed/performed       Past Medical History:  Diagnosis Date  . Arthritis    "pretty much all over"   . Basal cell carcinoma    "several burned off his body, face, head"  . BPH (benign prostatic hypertrophy)   . Coronary artery disease    a. s/p PCI of RCA in 2006  . CVA (cerebral infarction)    a. 06/2015: left thalamic and bilateral PCA  . GERD (gastroesophageal reflux disease)   . Hyperlipidemia   . Hypertension   . Presence of permanent cardiac pacemaker   . Seizures ([place])   . Stroke ([place])   . TIA (transient ischemic attack)    Approximately 6 weeks post-cardiac catheterization.     Past Surgical History:  Procedure Laterality Date  . CARDIOVASCULAR STRESS TEST  07/01/2007   EF 74%  . CATARACT EXTRACTION, BILATERAL    . CORONARY ANGIOPLASTY WITH STENT PLACEMENT  10/2004   stenting x 2 to RCA  . FEMUR IM NAIL Right 11/26/2015   Procedure: INTRAMEDULLARY RIGHT (IM) NAIL FEMORAL;  Surgeon: Rod Can, MD;  Location: [facility];  Service: Orthopedics;  Laterality: Right;  . FRACTURE SURGERY    . HERNIA REPAIR    . HIP ARTHROPLASTY  03/09/2011   Procedure: ARTHROPLASTY BIPOLAR HIP;   Surgeon: Mauri Pole;  Location: [facility];  Service: Orthopedics;  Laterality: Left;  . INSERT / REPLACE / REMOVE PACEMAKER  10/23/2016  . LAPAROSCOPIC INCISIONAL / UMBILICAL / VENTRAL HERNIA REPAIR     "below his naval"  . PACEMAKER IMPLANT N/A 10/23/2016   Procedure: Pacemaker Implant;  Surgeon: Thompson Grayer, MD;  Location: [facility] CV LAB;  Service: Cardiovascular;  Laterality: N/A;    There were no vitals filed for this visit.  Subjective Assessment - 06/04/17 1154    Subjective  THis morning felt dizzy. not now    Patient is accompained by:  Family member    Pertinent History  L THR, R hip fracture, h/o multiple CVAs, vascular dementia.    Patient Stated Goals  Want to be walking better. Walking more in the home (not using wheelchair). 05/14/17 added wants to be able to walk from parking lot into building and out again at end of session    Currently in Pain?  No/denies                       [facility] Adult PT Treatment/Exercise - 06/04/17 1210      Transfers   Sit to Stand  5: Supervision;4: Min guard  Sit to Stand Details (indicate cue type and reason)  various heights, with and without UE assist; vc for safe hand placement from regular height chair without armrests    Stand to Sit  5: Supervision    Number of Reps  -- 5reps x 3 sets      Ambulation/Gait   Ambulation/Gait Assistance  5: Supervision;4: Min guard    Ambulation/Gait Assistance Details  1/2" heel lift rt shoe; iniitailly supervision with progression to minguard as pt fatiqued    Ambulation Distance (Feet)  330 Feet 50, 50    Assistive device  Rolling walker    Gait Pattern  Step-through pattern;Decreased stance time - right;Decreased stride length;Decreased dorsiflexion - right;Right flexed knee in stance;Trunk flexed    Ambulation Surface  Indoor      Knee/Hip Exercises: Aerobic   Stepper  SciFit L1.3 seat 7; x 6 minutes               PT Short Term Goals - 05/14/17 1322      PT  SHORT TERM GOAL #1   Title  The patient will perform HEP with assist from wife as needed for LE strengthening, standing balance and general mobility.  TARGET DATE FOR ALL STGs:  05/16/16    Baseline  05/14/17 met    Time  4    Period  Weeks    Status  Achieved      PT SHORT TERM GOAL #2   Title  Patient will complete Berg Balance Assessment with goal to be set as appropriate.     Baseline  Scored 18/56 on 04/26/17    Time  1    Period  Weeks    Status  Achieved      PT SHORT TERM GOAL #3   Title  Patient will complete gait velocity assessment with goal to be set as appropriate.     Baseline  04/26/17: 0.63 ft/sec with RW.    Time  1    Period  Weeks    Status  Achieved      PT SHORT TERM GOAL #4   Title  Patient will improve 5x sit to stand to <=17 sec (with use of hands) to demonstrate improved LE strength and balance.     Time  4    Period  Weeks    Status  Not Met        PT Long Term Goals - 05/14/17 1329      PT LONG TERM GOAL #1   Title  The patient will perform updated HEP with assist from spouse. (Target for LTGs 06/15/17)    Time  8    Period  Weeks    Status  New      PT LONG TERM GOAL #2   Title  Patient will ambulate 300 ft without seated rest break with supervision to improve his ability and confidence to ambulate in his home.    Time  8    Period  Weeks    Status  New      PT LONG TERM GOAL #3   Title  Patient will improve gait velocity to ________ to demonstrate decreasing fall risk. (goal to be set based on level at 4 week/STG assessment)    Baseline  05/14/17 1.03 ft/sec with goal set for 1.30 ft/sec     Time  8    Period  Weeks    Status  New      PT LONG TERM GOAL #4  Title  Patient will improve Berg score to ________ to demonstrate decreasing fall risk. (goal to be set based on level at 4 week/STG assessment)    Baseline  05/14/17 24/56; goal for LTG 26/56    Time  8    Period  Weeks    Status  New      PT LONG TERM GOAL #5   Title  Patient will  improve 5x sit to stand to <=15 seconds (with use of UEs) to demonstrate improved LE strength and balance.    Baseline  05/14/17 34.16 seconds; goal revised to <30 seconds (?accuracy of measure on evaluation?)    Time  8    Period  Weeks    Status  Revised            Plan - 06/04/17 1318    Clinical Impression Statement  Session included gait training for increased distance to allow ambulating from parking lot into a business/restaurant with patient tolerating 340 ft! Continued to focus on safety with transfers from various surface heights with and without UE assist. Patient continues to progress and benefit from skilled PT>     Rehab Potential  Good    Clinical Impairments Affecting Rehab Potential  rt knee pain    PT Frequency  2x / week    PT Duration  8 weeks    PT Treatment/Interventions  ADLs/Self Care Home Management;Gait training;DME Instruction;Functional mobility training;Therapeutic activities;Therapeutic exercise;Balance training;Neuromuscular re-education;Cognitive remediation;Patient/family education;Orthotic Fit/Training    PT Next Visit Plan  I left 1/2" heel lift in right shoe by accident--check for skin problems/pain (!); use 1/2" heel lift for improved heel contact in gait and standing; up/down 4 steps with one rail for attending family event; end of session practice walking out to his car; balance training (cannot do Rt SLS due to rt knee pain and buckling) in // bars on beam, on rocker board, on blue airex); sit to stand on blue airex;  can also use Nustep for endurance; exercises for bil LEs;     PT Home Exercise Plan  SLR; Supine/seated marching; Supine/seated hip abdct; Bridges; Standing at sink (EO 30 sec, EC 30 sec); Tandem stance; Walk with walker every other day (180 ft); LEs to "walk" w/c around house.     Consulted and Agree with Plan of Care  Patient;Family member/caregiver    Family Member Consulted  spouse       Patient will benefit from skilled therapeutic  intervention in order to improve the following deficits and impairments:  Abnormal gait, Decreased activity tolerance, Decreased balance, Decreased cognition, Decreased mobility, Decreased range of motion, Difficulty walking, Impaired sensation, Postural dysfunction, Pain  Visit Diagnosis: Muscle weakness (generalized)  Other abnormalities of gait and mobility     Problem List Patient Active Problem List   Diagnosis Date Noted  . TIA (transient ischemic attack)   . Presence of permanent cardiac pacemaker   . Hypertension   . Hyperlipidemia   . Coronary artery disease   . Basal cell carcinoma   . Arthritis   . Pressure injury of skin 01/08/2017  . Stroke ([place]) 01/07/2017  . Ischemic stroke ([place])   . Second degree Mobitz II AV block 10/23/2016  . Seizures ([place]) 10/09/2016  . Healthcare maintenance 10/01/2016  . Restlessness and agitation 07/02/2016  . Vascular dementia with behavior disturbance 06/29/2016  . Hemiparesis and other late effects of cerebrovascular accident ([place]) 06/29/2016  . Embolic stroke ([place]) 99/35/7017  . Right hemiparesis ([place])   . History  of CVA with residual deficit   . Chronic anticoagulation   . Atrial fibrillation with rapid ventricular response ([place])   . Seizure prophylaxis   . Diastolic dysfunction   . Bacteremia due to Gram-positive bacteria   . Altered mental status   . Dementia without behavioral disturbance   . Leukocytosis   . Acute blood loss anemia   . Acute encephalopathy 06/04/2016  . PAF (paroxysmal atrial fibrillation) ([place])   . Iron deficiency anemia 04/04/2016  . History of stroke 04/04/2016  . Cerebral hemorrhage (HCC) w/ SDH s/p IV tPA   . Coronary artery disease involving native coronary artery of native heart without angina pectoris   . Orthostatic hypotension   . History of right hip replacement   . Acute ischemic stroke ([place]) - L temporal lobe s/p tPA 03/30/2016  . BPH (benign prostatic hyperplasia) 11/25/2015  . GERD  (gastroesophageal reflux disease) 11/25/2015  . CKD (chronic kidney disease), stage II 11/25/2015  . Overactive bladder   . Cerebrovascular accident (CVA) ([place]) 09/18/2015  . Gait disturbance, post-stroke 06/23/2015  . 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 Thalamic infarction ([place]) 06/21/2015  . Benign essential HTN   . Type 2 diabetes mellitus with complication, without long-term current use of insulin ([place])   . Dysphagia as late effect of cerebrovascular disease   . Hyponatremia   . HLD (hyperlipidemia)   . COPD (chronic obstructive pulmonary disease) ([place]) 03/09/2011    Jeanie Cooks Devanee Pomplun. PT 06/05/2017, 10:23 AM  [facility] [address] [place], [place], [address] Phone: [phone]   Fax:  [phone]  Name: Herbert Marquez MRN: [id number] Date of Birth: Feb 11, 1928

## 2017-06-07 ENCOUNTER — Ambulatory Visit: Payer: Medicare Other | Admitting: Physical Therapy

## 2017-06-07 ENCOUNTER — Encounter: Payer: Self-pay | Admitting: Physical Therapy

## 2017-06-07 DIAGNOSIS — R2681 Unsteadiness on feet: Secondary | ICD-10-CM

## 2017-06-07 DIAGNOSIS — R2689 Other abnormalities of gait and mobility: Secondary | ICD-10-CM | POA: Diagnosis not present

## 2017-06-07 DIAGNOSIS — M6281 Muscle weakness (generalized): Secondary | ICD-10-CM

## 2017-06-07 NOTE — Therapy (Signed)
[facility] [address] [place], [place], [address] Phone: [phone]   Fax:  [phone]  Physical Therapy Treatment  Patient Details  Name: Herbert Marquez MRN: [id number] Date of Birth: 10-31-1927 Referring Provider: Charlett Blake, MD   Encounter Date: 06/07/2017  PT End of Session - 06/07/17 1951    Visit Number  14    Number of Visits  17    Date for PT Re-Evaluation  06/15/17    Authorization Type  [employer] medicare    Authorization Time Period  04/16/17 to 06/15/17    PT Start Time  1148    PT Stop Time  1235    PT Time Calculation (min)  47 min    Equipment Utilized During Treatment  Gait belt    Activity Tolerance  Patient limited by fatigue several seated rest breaks    Behavior During Therapy  WFL for tasks assessed/performed       Past Medical History:  Diagnosis Date  . Arthritis    "pretty much all over"   . Basal cell carcinoma    "several burned off his body, face, head"  . BPH (benign prostatic hypertrophy)   . Coronary artery disease    a. s/p PCI of RCA in 2006  . CVA (cerebral infarction)    a. 06/2015: left thalamic and bilateral PCA  . GERD (gastroesophageal reflux disease)   . Hyperlipidemia   . Hypertension   . Presence of permanent cardiac pacemaker   . Seizures ([place])   . Stroke ([place])   . TIA (transient ischemic attack)    Approximately 6 weeks post-cardiac catheterization.     Past Surgical History:  Procedure Laterality Date  . CARDIOVASCULAR STRESS TEST  07/01/2007   EF 74%  . CATARACT EXTRACTION, BILATERAL    . CORONARY ANGIOPLASTY WITH STENT PLACEMENT  10/2004   stenting x 2 to RCA  . FEMUR IM NAIL Right 11/26/2015   Procedure: INTRAMEDULLARY RIGHT (IM) NAIL FEMORAL;  Surgeon: Rod Can, MD;  Location: [facility];  Service: Orthopedics;  Laterality: Right;  . FRACTURE SURGERY    . HERNIA REPAIR    . HIP ARTHROPLASTY  03/09/2011   Procedure: ARTHROPLASTY BIPOLAR HIP;   Surgeon: Mauri Pole;  Location: [facility];  Service: Orthopedics;  Laterality: Left;  . INSERT / REPLACE / REMOVE PACEMAKER  10/23/2016  . LAPAROSCOPIC INCISIONAL / UMBILICAL / VENTRAL HERNIA REPAIR     "below his naval"  . PACEMAKER IMPLANT N/A 10/23/2016   Procedure: Pacemaker Implant;  Surgeon: Thompson Grayer, MD;  Location: [facility];  Service: Cardiovascular;  Laterality: N/A;    There were no vitals filed for this visit.  Subjective Assessment - 06/07/17 1150    Subjective  I kept your heel lift by mistake. He reported he had no pain at midfoot where 1/2 inch heel lift abruptly stops. Foot inspected pre and post-session with no skin issues noted. Again discussed a built up shoe vs orthotic that can help his "right leg to reach the floor easier."     Patient is accompained by:  Family member    Pertinent History  L THR, R hip fracture, h/o multiple CVAs, vascular dementia.    Patient Stated Goals  Want to be walking better. Walking more in the home (not using wheelchair). 05/14/17 added wants to be able to walk from parking lot into building and out again at end of session    Currently in Pain?  No/denies                       [facility] Adult PT Treatment/Exercise - 06/07/17 0001      Transfers   Sit to Stand  5: Supervision;4: Min guard    Stand to Sit  5: Supervision      Ambulation/Gait   Ambulation/Gait Assistance  5: Supervision;4: Min guard    Ambulation/Gait Assistance Details  1/2" heel lift rt shoe; iniitailly supervision with progression to minguard as pt fatiqued    Ambulation Distance (Feet)  100 Feet 60, 180, 60, 100    Assistive device  Rolling walker    Gait Pattern  Step-through pattern;Decreased stance time - right;Decreased stride length;Decreased dorsiflexion - right;Right flexed knee in stance;Trunk flexed    Ambulation Surface  Level;Indoor    Stairs Assistance  4: Min assist    Stairs Assistance Details (indicate cue type and reason)  Rt  HHA during ascend and Lt HHA on descent with rail on opposite side    Stair Management Technique  One rail Left;Step to pattern;Forwards HHA on opposite side      Knee/Hip Exercises: Aerobic   Stepper  SciFit L1.3 seat 7; x 6 minutes          Balance Exercises - 06/07/17 2004      Balance Exercises: Standing   Standing Eyes Opened  Wide (BOA);Head turns;Foam/compliant surface single UE support on RW    SLS  Eyes open;Intermittent upper extremity support;Solid surface rt foot elevated on 2" beam          PT Short Term Goals - 05/14/17 1322      PT SHORT TERM GOAL #1   Title  The patient will perform HEP with assist from wife as needed for LE strengthening, standing balance and general mobility.  TARGET DATE FOR ALL STGs:  05/16/16    Baseline  05/14/17 met    Time  4    Period  Weeks    Status  Achieved      PT SHORT TERM GOAL #2   Title  Patient will complete Berg Balance Assessment with goal to be set as appropriate.     Baseline  Scored 18/56 on 04/26/17    Time  1    Period  Weeks    Status  Achieved      PT SHORT TERM GOAL #3   Title  Patient will complete gait velocity assessment with goal to be set as appropriate.     Baseline  04/26/17: 0.63 ft/sec with RW.    Time  1    Period  Weeks    Status  Achieved      PT SHORT TERM GOAL #4   Title  Patient will improve 5x sit to stand to <=17 sec (with use of hands) to demonstrate improved LE strength and balance.     Time  4    Period  Weeks    Status  Not Met        PT Long Term Goals - 05/14/17 1329      PT LONG TERM GOAL #1   Title  The patient will perform updated HEP with assist from spouse. (Target for LTGs 06/15/17)    Time  8    Period  Weeks    Status  New      PT LONG TERM GOAL #2   Title  Patient will ambulate 300 ft without seated rest break with supervision to  improve his ability and confidence to ambulate in his home.    Time  8    Period  Weeks    Status  New      PT LONG TERM GOAL #3    Title  Patient will improve gait velocity to ________ to demonstrate decreasing fall risk. (goal to be set based on level at 4 week/STG assessment)    Baseline  05/14/17 1.03 ft/sec with goal set for 1.30 ft/sec     Time  8    Period  Weeks    Status  New      PT LONG TERM GOAL #4   Title  Patient will improve Berg score to ________ to demonstrate decreasing fall risk. (goal to be set based on level at 4 week/STG assessment)    Baseline  05/14/17 24/56; goal for LTG 26/56    Time  8    Period  Weeks    Status  New      PT LONG TERM GOAL #5   Title  Patient will improve 5x sit to stand to <=15 seconds (with use of UEs) to demonstrate improved LE strength and balance.    Baseline  05/14/17 34.16 seconds; goal revised to <30 seconds (?accuracy of measure on evaluation?)    Time  8    Period  Weeks    Status  Revised            Plan - 06/07/17 1953    Clinical Impression Statement  Session included gait training with an emphasis on stair training with single rail and HHA on the opposite side. Utilized simulated shoe lift on pt's rt shoe to try to give him a better idea of how a shoe lift could benefit him. Patient and wife continued to decline seeking a referral from MD to orthotist. Balance training incorporated including use of compliant surface (black beam). Patient continues to improve his endurance and improvement towards walking a more functional distance for community ambulation. Continue PT towards goals.     Rehab Potential  Good    Clinical Impairments Affecting Rehab Potential  rt knee pain    PT Frequency  2x / week    PT Duration  8 weeks    PT Treatment/Interventions  ADLs/Self Care Home Management;Gait training;DME Instruction;Functional mobility training;Therapeutic activities;Therapeutic exercise;Balance training;Neuromuscular re-education;Cognitive remediation;Patient/family education;Orthotic Fit/Training    PT Next Visit Plan  re-assess HEP and begin to check LTGs with  d/c 06/14/17; up/down 4 steps with one rail for attending family event; end of session practice walking out to his car    PT Home Exercise Plan  SLR; Supine/seated marching; Supine/seated hip abdct; Bridges; Standing at sink (EO 30 sec, EC 30 sec); Tandem stance; Walk with walker every other day (180 ft); LEs to "walk" w/c around house.     Consulted and Agree with Plan of Care  Patient;Family member/caregiver    Family Member Consulted  spouse       Patient will benefit from skilled therapeutic intervention in order to improve the following deficits and impairments:  Abnormal gait, Decreased activity tolerance, Decreased balance, Decreased cognition, Decreased mobility, Decreased range of motion, Difficulty walking, Impaired sensation, Postural dysfunction, Pain  Visit Diagnosis: Muscle weakness (generalized)  Other abnormalities of gait and mobility  Unsteadiness on feet     Problem List Patient Active Problem List   Diagnosis Date Noted  . TIA (transient ischemic attack)   . Presence of permanent cardiac pacemaker   . Hypertension   .  Hyperlipidemia   . Coronary artery disease   . Basal cell carcinoma   . Arthritis   . Pressure injury of skin 01/08/2017  . Stroke ([facility]) 01/07/2017  . Ischemic stroke ([place])   . Second degree Mobitz II AV block 10/23/2016  . Seizures ([facility]) 10/09/2016  . Healthcare maintenance 10/01/2016  . Restlessness and agitation 07/02/2016  . Vascular dementia with behavior disturbance 06/29/2016  . Hemiparesis and other late effects of cerebrovascular accident ([place]) 06/29/2016  . Embolic stroke ([place]) 46/65/9935  . Right hemiparesis ([place] [place])   . History of CVA with residual deficit   . Chronic anticoagulation   . Atrial fibrillation with rapid ventricular response ([facility])   . Seizure prophylaxis   . Diastolic dysfunction   . Bacteremia due to Gram-positive bacteria   . Altered mental status   . Dementia without behavioral disturbance   . Leukocytosis   .  Acute blood loss anemia   . Acute encephalopathy 06/04/2016  . PAF (paroxysmal atrial fibrillation) ([facility])   . Iron deficiency anemia 04/04/2016  . History of stroke 04/04/2016  . Cerebral hemorrhage (HCC) w/ SDH s/p IV tPA   . Coronary artery disease involving native coronary artery of native heart without angina pectoris   . Orthostatic hypotension   . History of right hip replacement   . Acute ischemic stroke ([place] [place]) - L temporal lobe s/p tPA 03/30/2016  . BPH (benign prostatic hyperplasia) 11/25/2015  . GERD (gastroesophageal reflux disease) 11/25/2015  . CKD (chronic kidney disease), stage II 11/25/2015  . Overactive bladder   . Cerebrovascular accident (CVA) ([facility]) 09/18/2015  . Gait disturbance, post-stroke 06/23/2015  . 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 Thalamic infarction ([place]) 06/21/2015  . Benign essential HTN   . Type 2 diabetes mellitus with complication, without long-term current use of insulin ([facility])   . Dysphagia as late effect of cerebrovascular disease   . Hyponatremia   . HLD (hyperlipidemia)   . COPD (chronic obstructive pulmonary disease) ([facility]) 03/09/2011    Rexanne Mano, PT 06/07/2017, 8:05 PM  [facility] [address] [place], [place], [address] Phone: [phone]   Fax:  [phone]  Name: SRICHARAN LACOMB MRN: [id number] Date of Birth: 30-Oct-1927

## 2017-06-11 ENCOUNTER — Ambulatory Visit (INDEPENDENT_AMBULATORY_CARE_PROVIDER_SITE_OTHER): Payer: Medicare Other | Admitting: *Deleted

## 2017-06-11 ENCOUNTER — Encounter: Payer: Self-pay | Admitting: Physical Therapy

## 2017-06-11 ENCOUNTER — Ambulatory Visit: Payer: Medicare Other | Admitting: Physical Therapy

## 2017-06-11 DIAGNOSIS — M6281 Muscle weakness (generalized): Secondary | ICD-10-CM

## 2017-06-11 DIAGNOSIS — R2681 Unsteadiness on feet: Secondary | ICD-10-CM | POA: Diagnosis not present

## 2017-06-11 DIAGNOSIS — R2689 Other abnormalities of gait and mobility: Secondary | ICD-10-CM | POA: Diagnosis not present

## 2017-06-11 DIAGNOSIS — I441 Atrioventricular block, second degree: Secondary | ICD-10-CM | POA: Diagnosis not present

## 2017-06-11 NOTE — Progress Notes (Signed)
Remote pacemaker transmission.   

## 2017-06-12 NOTE — Therapy (Signed)
[facility] [address] [place], [place], [address] Phone: [phone]   Fax:  [phone]  Physical Therapy Treatment  Patient Details  Name: Herbert Marquez MRN: [id number] Date of Birth: 1927/11/05 Referring Provider: Charlett Blake, MD   Encounter Date: 06/11/2017  PT End of Session - 06/11/17 2121    Visit Number  15    Number of Visits  17    Date for PT Re-Evaluation  06/15/17    Authorization Type  [employer] medicare    Authorization Time Period  04/16/17 to 06/15/17    PT Start Time  1102    PT Stop Time  1146    PT Time Calculation (min)  44 min    Equipment Utilized During Treatment  Gait belt    Activity Tolerance  Patient limited by fatigue several seated rest breaks    Behavior During Therapy  WFL for tasks assessed/performed       Past Medical History:  Diagnosis Date  . Arthritis    "pretty much all over"   . Basal cell carcinoma    "several burned off his body, face, head"  . BPH (benign prostatic hypertrophy)   . Coronary artery disease    a. s/p PCI of RCA in 2006  . CVA (cerebral infarction)    a. 06/2015: left thalamic and bilateral PCA  . GERD (gastroesophageal reflux disease)   . Hyperlipidemia   . Hypertension   . Presence of permanent cardiac pacemaker   . Seizures ([place])   . Stroke ([facility])   . TIA (transient ischemic attack)    Approximately 6 weeks post-cardiac catheterization.     Past Surgical History:  Procedure Laterality Date  . CARDIOVASCULAR STRESS TEST  07/01/2007   EF 74%  . CATARACT EXTRACTION, BILATERAL    . CORONARY ANGIOPLASTY WITH STENT PLACEMENT  10/2004   stenting x 2 to RCA  . FEMUR IM NAIL Right 11/26/2015   Procedure: INTRAMEDULLARY RIGHT (IM) NAIL FEMORAL;  Surgeon: Rod Can, MD;  [facility];  Service: Orthopedics;  Laterality: Right;  . FRACTURE SURGERY    . HERNIA REPAIR    . HIP ARTHROPLASTY  03/09/2011   Procedure: ARTHROPLASTY BIPOLAR HIP;   Surgeon: Mauri Pole;  [facility];  Service: Orthopedics;  Laterality: Left;  . INSERT / REPLACE / REMOVE PACEMAKER  10/23/2016  . LAPAROSCOPIC INCISIONAL / UMBILICAL / VENTRAL HERNIA REPAIR     "below his naval"  . PACEMAKER IMPLANT N/A 10/23/2016   Procedure: Pacemaker Implant;  Surgeon: Thompson Grayer, MD;  Location: [facility];  Service: Cardiovascular;  Laterality: N/A;    There were no vitals filed for this visit.  Subjective Assessment - 06/11/17 1105    Subjective  Went out to busy restaurant this weekend. Used w/c due to steps vs long ramp.     Patient is accompained by:  Family member    Pertinent History  L THR, R hip fracture, h/o multiple CVAs, vascular dementia.    Patient Stated Goals  Want to be walking better. Walking more in the home (not using wheelchair). 05/14/17 added wants to be able to walk from parking lot into building and out again at end of session    Currently in Pain?  No/denies                       [facility] Adult PT Treatment/Exercise - 06/11/17 0001      Transfers  Five time sit to stand comments   26.09 + use UEs on arms of chair    Stand to Sit  5: Supervision      Ambulation/Gait   Ambulation/Gait Assistance  5: Supervision;4: Min guard    Ambulation/Gait Assistance Details  improved rt heel contact with vc <20% of steps    Ambulation Distance (Feet)  80 Feet 60, 60,     Assistive device  Rolling walker    Gait Pattern  Step-through pattern;Decreased stance time - right;Decreased stride length;Decreased dorsiflexion - right;Right flexed knee in stance;Trunk flexed    Ambulation Surface  Indoor    Stairs Assistance  4: Min guard    Stair Management Technique  Two rails;Step to pattern;Forwards    Number of Stairs  4    Height of Stairs  6          Balance Exercises - 06/11/17 1129      Balance Exercises: Standing   Rockerboard  Anterior/posterior;Head turns;EO;10 seconds    Balance Beam  blue beam in // bars;  LUE support only; step off/on forward and backward with RLE      Standing with RLE on green balance disk, RUE on walker while playing Connect 4 with LUE. Rt ankle with tendency to supinate if pt not focusing on controlling position therefore activity terminated after ~4 minutes.     PT Short Term Goals - 05/14/17 1322      PT SHORT TERM GOAL #1   Title  The patient will perform HEP with assist from wife as needed for LE strengthening, standing balance and general mobility.  TARGET DATE FOR ALL STGs:  05/16/16    Baseline  05/14/17 met    Time  4    Period  Weeks    Status  Achieved      PT SHORT TERM GOAL #2   Title  Patient will complete Berg Balance Assessment with goal to be set as appropriate.     Baseline  Scored 18/56 on 04/26/17    Time  1    Period  Weeks    Status  Achieved      PT SHORT TERM GOAL #3   Title  Patient will complete gait velocity assessment with goal to be set as appropriate.     Baseline  04/26/17: 0.63 ft/sec with RW.    Time  1    Period  Weeks    Status  Achieved      PT SHORT TERM GOAL #4   Title  Patient will improve 5x sit to stand to <=17 sec (with use of hands) to demonstrate improved LE strength and balance.     Time  4    Period  Weeks    Status  Not Met        PT Long Term Goals - 06/11/17 2118      PT LONG TERM GOAL #1   Title  The patient will perform updated HEP with assist from spouse. (Target for LTGs 06/15/17)    Time  8    Period  Weeks    Status  Partially Met      PT LONG TERM GOAL #2   Title  Patient will ambulate 300 ft without seated rest break with supervision to improve his ability and confidence to ambulate in his home.    Baseline  06/12/14 progressing; 4/9 met (walked out to his car)    Time  8    Period  Weeks  Status  Achieved      PT LONG TERM GOAL #3   Title  Patient will improve gait velocity to ________ to demonstrate decreasing fall risk. (goal to be set based on level at 4 week/STG assessment)    Baseline   05/14/17 1.03 ft/sec with goal set for 1.30 ft/sec     Time  8    Period  Weeks    Status  New      PT LONG TERM GOAL #4   Title  Patient will improve Berg score to ________ to demonstrate decreasing fall risk. (goal to be set based on level at 4 week/STG assessment)    Baseline  05/14/17 24/56; goal for LTG 26/56    Time  8    Period  Weeks    Status  New      PT LONG TERM GOAL #5   Title  Patient will improve 5x sit to stand to <=15 seconds (with use of UEs) to demonstrate improved LE strength and balance.    Baseline  05/14/17 34.16 seconds; goal revised to <30 seconds (?accuracy of measure on evaluation?); 06/11/17 26.09sec (revised goal at left met)    Time  8    Period  Weeks    Status  Achieved            Plan - 06/12/17 0536    Clinical Impression Statement  Initiated checking LTGs (to be completed next visit). Patient continues to refuse referral to orthotist for built-up shoe +/- orthotic for RLE due to functionally shorter with rt knee contracture. Continued focus on gait training (including stairs), LE strengthening, and balance training. Anticipate d/c next visit with pt to continue activity at [facility] (go with wife).     Rehab Potential  Good    Clinical Impairments Affecting Rehab Potential  rt knee pain    PT Frequency  2x / week    PT Duration  8 weeks    PT Treatment/Interventions  ADLs/Self Care Home Management;Gait training;DME Instruction;Functional mobility training;Therapeutic activities;Therapeutic exercise;Balance training;Neuromuscular re-education;Cognitive remediation;Patient/family education;Orthotic Fit/Training    PT Next Visit Plan  check LTGs with d/c 06/14/17;     PT Home Exercise Plan  SLR; Supine/seated marching; Supine/seated hip abdct; Bridges; Standing at sink (EO 30 sec, EC 30 sec); Tandem stance; Walk with walker every other day (180 ft); LEs to "walk" w/c around house.     Consulted and Agree with Plan of Care  Patient;Family member/caregiver     Family Member Consulted  spouse       Patient will benefit from skilled therapeutic intervention in order to improve the following deficits and impairments:  Abnormal gait, Decreased activity tolerance, Decreased balance, Decreased cognition, Decreased mobility, Decreased range of motion, Difficulty walking, Impaired sensation, Postural dysfunction, Pain  Visit Diagnosis: Muscle weakness (generalized)  Other abnormalities of gait and mobility  Unsteadiness on feet     Problem List Patient Active Problem List   Diagnosis Date Noted  . TIA (transient ischemic attack)   . Presence of permanent cardiac pacemaker   . Hypertension   . Hyperlipidemia   . Coronary artery disease   . Basal cell carcinoma   . Arthritis   . Pressure injury of skin 01/08/2017  . Stroke ([place]) 01/07/2017  . Ischemic stroke ([place])   . Second degree Mobitz II AV block 10/23/2016  . Seizures ([place]) 10/09/2016  . Healthcare maintenance 10/01/2016  . Restlessness and agitation 07/02/2016  . Vascular dementia with behavior disturbance 06/29/2016  .  Hemiparesis and other late effects of cerebrovascular accident ([place]) 06/29/2016  . Embolic stroke ([place]) 58/25/1898  . Right hemiparesis ([place])   . History of CVA with residual deficit   . Chronic anticoagulation   . Atrial fibrillation with rapid ventricular response ([place])   . Seizure prophylaxis   . Diastolic dysfunction   . Bacteremia due to Gram-positive bacteria   . Altered mental status   . Dementia without behavioral disturbance   . Leukocytosis   . Acute blood loss anemia   . Acute encephalopathy 06/04/2016  . PAF (paroxysmal atrial fibrillation) ([place])   . Iron deficiency anemia 04/04/2016  . History of stroke 04/04/2016  . Cerebral hemorrhage (HCC) w/ SDH s/p IV tPA   . Coronary artery disease involving native coronary artery of native heart without angina pectoris   . Orthostatic hypotension   . History of right hip replacement   . Acute ischemic  stroke ([place]) - L temporal lobe s/p tPA 03/30/2016  . BPH (benign prostatic hyperplasia) 11/25/2015  . GERD (gastroesophageal reflux disease) 11/25/2015  . CKD (chronic kidney disease), stage II 11/25/2015  . Overactive bladder   . Cerebrovascular accident (CVA) ([place]) 09/18/2015  . Gait disturbance, post-stroke 06/23/2015  . 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 Thalamic infarction ([place]) 06/21/2015  . Benign essential HTN   . Type 2 diabetes mellitus with complication, without long-term current use of insulin ([place])   . Dysphagia as late effect of cerebrovascular disease   . Hyponatremia   . HLD (hyperlipidemia)   . COPD (chronic obstructive pulmonary disease) ([place]) 03/09/2011    Rexanne Mano, PT 06/12/2017, 5:44 AM  [facility] [address] [place], [place], [address] Phone: [phone]   Fax:  [phone]  Name: Herbert Marquez MRN: [id number] Date of Birth: 05-16-1927

## 2017-06-13 ENCOUNTER — Encounter: Payer: Self-pay | Admitting: Cardiology

## 2017-06-14 ENCOUNTER — Ambulatory Visit: Payer: Medicare Other | Admitting: Physical Therapy

## 2017-06-14 ENCOUNTER — Encounter: Payer: Self-pay | Admitting: Physical Therapy

## 2017-06-14 DIAGNOSIS — R2689 Other abnormalities of gait and mobility: Secondary | ICD-10-CM

## 2017-06-14 DIAGNOSIS — R2681 Unsteadiness on feet: Secondary | ICD-10-CM

## 2017-06-14 DIAGNOSIS — M6281 Muscle weakness (generalized): Secondary | ICD-10-CM

## 2017-06-14 NOTE — Therapy (Signed)
[facility] [address] [place], [place], [address] Phone: [phone]   Fax:  [phone]  Physical Therapy Treatment and Discharge  Patient Details  Name: Herbert Marquez MRN: [id number] Date of Birth: 18-Jun-1927 Referring Provider: Charlett Blake, MD   Encounter Date: 06/14/2017  PT End of Session - 06/14/17 1848    Visit Number  16    Number of Visits  17    Date for PT Re-Evaluation  06/15/17    Authorization Type  [employer] medicare    Authorization Time Period  04/16/17 to 06/15/17    PT Start Time  1145    PT Stop Time  1230    PT Time Calculation (min)  45 min    Equipment Utilized During Treatment  Gait belt    Activity Tolerance  No increased pain;Patient limited by pain several seated rest breaks    Behavior During Therapy  [facility] for tasks assessed/performed       Past Medical History:  Diagnosis Date  . Arthritis    "pretty much all over"   . Basal cell carcinoma    "several burned off his body, face, head"  . BPH (benign prostatic hypertrophy)   . Coronary artery disease    a. s/p PCI of RCA in 2006  . CVA (cerebral infarction)    a. 06/2015: left thalamic and bilateral PCA  . GERD (gastroesophageal reflux disease)   . Hyperlipidemia   . Hypertension   . Presence of permanent cardiac pacemaker   . Seizures ([place])   . Stroke ([place])   . TIA (transient ischemic attack)    Approximately 6 weeks post-cardiac catheterization.     Past Surgical History:  Procedure Laterality Date  . CARDIOVASCULAR STRESS TEST  07/01/2007   EF 74%  . CATARACT EXTRACTION, BILATERAL    . CORONARY ANGIOPLASTY WITH STENT PLACEMENT  10/2004   stenting x 2 to RCA  . FEMUR IM NAIL Right 11/26/2015   Procedure: INTRAMEDULLARY RIGHT (IM) NAIL FEMORAL;  Surgeon: Rod Can, MD;  Location: [facility];  Service: Orthopedics;  Laterality: Right;  . FRACTURE SURGERY    . HERNIA REPAIR    . HIP ARTHROPLASTY  03/09/2011   Procedure: ARTHROPLASTY BIPOLAR HIP;  Surgeon: Mauri Pole;  Location: [facility];  Service: Orthopedics;  Laterality: Left;  . INSERT / REPLACE / REMOVE PACEMAKER  10/23/2016  . LAPAROSCOPIC INCISIONAL / UMBILICAL / VENTRAL HERNIA REPAIR     "below his naval"  . PACEMAKER IMPLANT N/A 10/23/2016   Procedure: Pacemaker Implant;  Surgeon: Thompson Grayer, MD;  Location: [facility];  Service: Cardiovascular;  Laterality: N/A;    There were no vitals filed for this visit.  Subjective Assessment - 06/14/17 1148    Subjective  Weather is making both his rt wrist and rt knee hurt more today.     Patient is accompained by:  Family member    Pertinent History  L THR, R hip fracture, h/o multiple CVAs, vascular dementia.    Patient Stated Goals  Want to be walking better. Walking more in the home (not using wheelchair). 05/14/17 added wants to be able to walk from parking lot into building and out again at end of session    Currently in Pain?  Yes    Pain Score  4     Pain Location  Wrist    Pain Orientation  Right    Pain Descriptors / Indicators  Aching  Pain Type  Chronic pain    Pain Onset  More than a month ago    Pain Frequency  Intermittent    Aggravating Factors   weight bearing; use in general    Pain Relieving Factors  rest, medicine    Multiple Pain Sites  Yes    Pain Score  3    Pain Location  Knee    Pain Orientation  Right    Pain Descriptors / Indicators  Aching;Sharp    Pain Type  Chronic pain    Pain Onset  More than a month ago    Pain Frequency  Intermittent    Aggravating Factors   walking, standing    Pain Relieving Factors  resting, medicine    Effect of Pain on Daily Activities  limits his walking and uses his w/c more         [facility] PT Assessment - 06/14/17 1202      Ambulation/Gait   Gait velocity  32.8/38.31=0.87      Berg Balance Test   Sit to Stand  Able to stand  independently using hands    Standing Unsupported  Able to stand 2 minutes with  supervision    Sitting with Back Unsupported but Feet Supported on Floor or Stool  Able to sit safely and securely 2 minutes    Stand to Sit  Controls descent by using hands    Transfers  Able to transfer with verbal cueing and /or supervision    Standing Unsupported with Eyes Closed  Able to stand 10 seconds with supervision    Standing Ubsupported with Feet Together  Able to place feet together independently and stand for 1 minute with supervision    From Standing, Reach Forward with Outstretched Arm  Reaches forward but needs supervision    From Standing Position, Pick up Object from Floor  Unable to pick up and needs supervision    From Standing Position, Turn to Look Behind Over each Shoulder  Needs supervision when turning    Turn 360 Degrees  Needs assistance while turning    Standing Unsupported, Alternately Place Feet on Step/Stool  Needs assistance to keep from falling or unable to try    Standing Unsupported, One Foot in Front  Able to take small step independently and hold 30 seconds    Standing on One Leg  Unable to try or needs assist to prevent fall    Total Score  26                             PT Short Term Goals - 05/14/17 1322      PT SHORT TERM GOAL #1   Title  The patient will perform HEP with assist from wife as needed for LE strengthening, standing balance and general mobility.  TARGET DATE FOR ALL STGs:  05/16/16    Baseline  05/14/17 met    Time  4    Period  Weeks    Status  Achieved      PT SHORT TERM GOAL #2   Title  Patient will complete Berg Balance Assessment with goal to be set as appropriate.     Baseline  Scored 18/56 on 04/26/17    Time  1    Period  Weeks    Status  Achieved      PT SHORT TERM GOAL #3   Title  Patient will complete gait velocity assessment with goal to  be set as appropriate.     Baseline  04/26/17: 0.63 ft/sec with RW.    Time  1    Period  Weeks    Status  Achieved      PT SHORT TERM GOAL #4   Title   Patient will improve 5x sit to stand to <=17 sec (with use of hands) to demonstrate improved LE strength and balance.     Time  4    Period  Weeks    Status  Not Met        PT Long Term Goals - 06/14/17 1150      PT LONG TERM GOAL #1   Title  The patient will perform updated HEP with assist from spouse. (Target for LTGs 06/15/17)    Time  8    Period  Weeks    Status  Achieved      PT LONG TERM GOAL #2   Title  Patient will ambulate 300 ft without seated rest break with supervision to improve his ability and confidence to ambulate in his home.    Baseline  06/12/14 progressing; 4/9 met (walked out to his car)    Time  8    Period  Weeks    Status  Achieved      PT LONG TERM GOAL #3   Title  Patient will improve gait velocity to ________ to demonstrate decreasing fall risk. (goal to be set based on level at 4 week/STG assessment)    Baseline  05/14/17 1.03 ft/sec with goal set for 1.30 ft/sec 06/13/17 0.87 ft/ssec due to incr rt wrist and knee pain this date    Time  8    Period  Weeks    Status  Not Met      PT LONG TERM GOAL #4   Title  Patient will improve Berg score to ________ to demonstrate decreasing fall risk. (goal to be set based on level at 4 week/STG assessment)    Baseline  05/14/17 24/56; goal for LTG 26/56;  06/13/17 26/56    Time  8    Period  Weeks    Status  Achieved      PT LONG TERM GOAL #5   Title  Patient will improve 5x sit to stand to <=15 seconds (with use of UEs) to demonstrate improved LE strength and balance.    Baseline  05/14/17 34.16 seconds; goal revised to <30 seconds (?accuracy of measure on evaluation?); 06/11/17 26.09sec (revised goal at left met)    Time  8    Period  Weeks    Status  Achieved            Plan - 06/14/17 1849    Clinical Impression Statement  Assessed remaining LTGs with pt overall meeting 4 of 5 goals and did not meet one goal (likely due to severity of knee & wrist pain today). Patient pleased with progress and [facility] and wife can attend with him. No furher questions and pateint is discharged from PT.     Rehab Potential  Good    Clinical Impairments Affecting Rehab Potential  rt knee pain    PT Frequency  2x / week    PT Duration  8 weeks    PT Treatment/Interventions  ADLs/Self Care Home Management;Gait training;DME Instruction;Functional mobility training;Therapeutic activities;Therapeutic exercise;Balance training;Neuromuscular re-education;Cognitive remediation;Patient/family education;Orthotic Fit/Training    Consulted and Agree with Plan of Care  Patient;Family member/caregiver    Family Member Consulted  spouse       Patient will benefit from skilled therapeutic intervention in order to improve the following deficits and impairments:  Abnormal gait, Decreased activity tolerance, Decreased balance, Decreased cognition, Decreased mobility, Decreased range of motion, Difficulty walking, Impaired sensation, Postural dysfunction, Pain  Visit Diagnosis: Muscle weakness (generalized)  Other abnormalities of gait and mobility  Unsteadiness on feet     Problem List Patient Active Problem List   Diagnosis Date Noted  . TIA (transient ischemic attack)   . Presence of permanent cardiac pacemaker   . Hypertension   . Hyperlipidemia   . Coronary artery disease   . Basal cell carcinoma   . Arthritis   . Pressure injury of skin 01/08/2017  . Stroke ([place]) 01/07/2017  . Ischemic stroke ([place])   . Second degree Mobitz II AV block 10/23/2016  . Seizures ([place]) 10/09/2016  . Healthcare maintenance 10/01/2016  . Restlessness and agitation 07/02/2016  . Vascular dementia with behavior disturbance 06/29/2016  . Hemiparesis and other late effects of cerebrovascular accident ([place]) 06/29/2016  . Embolic stroke ([place]) 35/46/5681  . Right hemiparesis ([place])   . History of CVA with residual deficit   . Chronic anticoagulation   . Atrial  fibrillation with rapid ventricular response ([place])   . Seizure prophylaxis   . Diastolic dysfunction   . Bacteremia due to Gram-positive bacteria   . Altered mental status   . Dementia without behavioral disturbance   . Leukocytosis   . Acute blood loss anemia   . Acute encephalopathy 06/04/2016  . PAF (paroxysmal atrial fibrillation) ([place])   . Iron deficiency anemia 04/04/2016  . History of stroke 04/04/2016  . Cerebral hemorrhage (HCC) w/ SDH s/p IV tPA   . Coronary artery disease involving native coronary artery of native heart without angina pectoris   . Orthostatic hypotension   . History of right hip replacement   . Acute ischemic stroke ([place]) - L temporal lobe s/p tPA 03/30/2016  . BPH (benign prostatic hyperplasia) 11/25/2015  . GERD (gastroesophageal reflux disease) 11/25/2015  . CKD (chronic kidney disease), stage II 11/25/2015  . Overactive bladder   . Cerebrovascular accident (CVA) ([place]) 09/18/2015  . Gait disturbance, post-stroke 06/23/2015  . 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 Thalamic infarction ([place]) 06/21/2015  . Benign essential HTN   . Type 2 diabetes mellitus with complication, without long-term current use of insulin ([place])   . Dysphagia as late effect of cerebrovascular disease   . Hyponatremia   . HLD (hyperlipidemia)   . COPD (chronic obstructive pulmonary disease) ([place]) 03/09/2011   PHYSICAL THERAPY DISCHARGE SUMMARY  Visits from Start of Care: 16  Current functional level related to goals / functional outcomes: See results of goals above; ambulating up to 300 ft with RW with supervision   Remaining deficits: Rt knee pain and rt sided weakness continue to limit mobility   Education / Equipment: HEP, 1/2" heel lift for rt shoe to help rt heel contact the ground in standing due to rt knee flexion contracture  Plan: Patient agrees to discharge.  Patient goals were partially met. Patient is being discharged due to being pleased with the current  functional level.  ?????         Rexanne Mano, PT 06/14/2017, 6:53 PM  [facility] [address] [place], [place], [address] Phone: [phone]   Fax:  [phone]  Name: Herbert Marquez  MRN: [id number] Date of Birth: 1927-03-06

## 2017-06-17 ENCOUNTER — Other Ambulatory Visit: Payer: Self-pay | Admitting: Adult Health

## 2017-06-20 ENCOUNTER — Encounter: Payer: Self-pay | Admitting: Physical Medicine & Rehabilitation

## 2017-06-20 ENCOUNTER — Ambulatory Visit (HOSPITAL_BASED_OUTPATIENT_CLINIC_OR_DEPARTMENT_OTHER): Payer: Medicare Other | Admitting: Physical Medicine & Rehabilitation

## 2017-06-20 ENCOUNTER — Encounter: Payer: Medicare Other | Attending: Physical Medicine & Rehabilitation

## 2017-06-20 VITALS — BP 94/39 | HR 68 | Ht 67.0 in | Wt 162.0 lb

## 2017-06-20 DIAGNOSIS — I69998 Other sequelae following unspecified cerebrovascular disease: Secondary | ICD-10-CM | POA: Diagnosis not present

## 2017-06-20 DIAGNOSIS — I69898 Other sequelae of other cerebrovascular disease: Secondary | ICD-10-CM | POA: Diagnosis present

## 2017-06-20 DIAGNOSIS — R269 Unspecified abnormalities of gait and mobility: Secondary | ICD-10-CM | POA: Insufficient documentation

## 2017-06-20 DIAGNOSIS — M1711 Unilateral primary osteoarthritis, right knee: Secondary | ICD-10-CM | POA: Diagnosis not present

## 2017-06-20 NOTE — Progress Notes (Signed)
Subjective:    Patient ID: Herbert Marquez, male    DOB: 09/20/1927, 84 y.o.   MRN: [id number]  HPI  Pain Inventory Average Pain 3 Pain Right Now 3 My pain is aching  In the last 24 hours, has pain interfered with the following? General activity 3 Relation with others 2 Enjoyment of life 3 What TIME of day is your pain at its worst? daytime Sleep (in general) Fair  Pain is worse with: walking Pain improves with: therapy/exercise Relief from Meds: .  Mobility walk with assistance use a walker ability to climb steps?  no do you drive?  no use a wheelchair transfers alone  Function retired  Neuro/Psych bladder control problems trouble walking  Prior Studies Any changes since last visit?  no  Physicians involved in your care Any changes since last visit?  no   Family History  Problem Relation Age of Onset  . Hypertension Mother   . Lung cancer Father   . Lung cancer Brother    Social History   Socioeconomic History  . Marital status: Married    Spouse name: Not on file  . Number of children: Not on file  . Years of education: Not on file  . Highest education level: Not on file  Occupational History  . Occupation: Retired    Fish farm manager: OTHER  Social Needs  . Financial resource strain: Not on file  . Food insecurity:    Worry: Not on file    Inability: Not on file  . Transportation needs:    Medical: Not on file    Non-medical: Not on file  Tobacco Use  . Smoking status: Former Smoker    Last attempt to quit: 03/06/1971    Years since quitting: 46.3  . Smokeless tobacco: Never Used  Substance and Sexual Activity  . Alcohol use: Yes    Alcohol/week: 4.8 oz    Types: 7 Glasses of wine, 1 Cans of beer per week    Comment: 1/2 BEER AND 1 WINE  . Drug use: No  . Sexual activity: Never  Lifestyle  . Physical activity:    Days per week: Not on file    Minutes per session: Not on file  . Stress: Not on file  Relationships  . Social connections:      Talks on phone: Not on file    Gets together: Not on file    Attends religious service: Not on file    Active member of club or organization: Not on file    Attends meetings of clubs or organizations: Not on file    Relationship status: Not on file  Other Topics Concern  . Not on file  Social History Narrative   Lives in [place], [place] with wife. Has 3 children.    Past Surgical History:  Procedure Laterality Date  . CARDIOVASCULAR STRESS TEST  07/01/2007   EF 74%  . CATARACT EXTRACTION, BILATERAL    . CORONARY ANGIOPLASTY WITH STENT PLACEMENT  10/2004   stenting x 2 to RCA  . FEMUR IM NAIL Right 11/26/2015   Procedure: INTRAMEDULLARY RIGHT (IM) NAIL FEMORAL;  Surgeon: Rod Can, MD;  [facility];  Service: Orthopedics;  Laterality: Right;  . FRACTURE SURGERY    . HERNIA REPAIR    . HIP ARTHROPLASTY  03/09/2011   Procedure: ARTHROPLASTY BIPOLAR HIP;  Surgeon: Mauri Pole;  [facility];  Service: Orthopedics;  Laterality: Left;  . INSERT / REPLACE / REMOVE PACEMAKER  10/23/2016  . LAPAROSCOPIC INCISIONAL / UMBILICAL / VENTRAL HERNIA REPAIR     "below his naval"  . PACEMAKER IMPLANT N/A 10/23/2016   Procedure: Pacemaker Implant;  Surgeon: Thompson Grayer, MD;  Location: [facility];  Service: Cardiovascular;  Laterality: N/A;   Past Medical History:  Diagnosis Date  . Arthritis    "pretty much all over"   . Basal cell carcinoma    "several burned off his body, face, head"  . BPH (benign prostatic hypertrophy)   . Coronary artery disease    a. s/p PCI of RCA in 2006  . CVA (cerebral infarction)    a. 06/2015: left thalamic and bilateral PCA  . GERD (gastroesophageal reflux disease)   . Hyperlipidemia   . Hypertension   . Presence of permanent cardiac pacemaker   . Seizures ([place])   . Stroke ([place])   . TIA (transient ischemic attack)    Approximately 6 weeks post-cardiac catheterization.    There were no vitals taken for this visit.  Opioid  Risk Score:   Fall Risk Score:  `1  Depression screen PHQ 2/9  Depression screen [facility] 2/9 03/28/2017 01/29/2017 01/07/2017 06/29/2016 03/20/2016 08/18/2015 07/21/2015  Decreased Interest 1 2 0 1 1 0 0  Down, Depressed, Hopeless 0 2 0 1 1 0 1  PHQ - 2 Score 1 4 0 2 2 0 1  Altered sleeping 0 0 - 0 3 - 0  Tired, decreased energy 1 2 - 2 0 - 2  Change in appetite 0 1 - 0 0 - 0  Feeling bad or failure about yourself  1 2 - 1 1 - 1  Trouble concentrating 1 2 - 2 0 - 0  Moving slowly or fidgety/restless 1 1 - 0 0 - 1  Suicidal thoughts 0 1 - 0 0 - 0  PHQ-9 Score 5 13 - 7 6 - 5  Difficult doing work/chores Somewhat difficult Somewhat difficult - Somewhat difficult Somewhat difficult - Somewhat difficult  Some recent data might be hidden     Review of Systems  Constitutional: Negative.   HENT: Negative.   Eyes: Negative.   Respiratory: Negative.   Cardiovascular: Negative.   Gastrointestinal: Negative.   Endocrine: Negative.   Genitourinary: Negative.   Musculoskeletal: Positive for arthralgias, gait problem and myalgias.  Skin: Negative.   Allergic/Immunologic: Negative.   Hematological: Bruises/bleeds easily.  Psychiatric/Behavioral: Negative.   All other systems reviewed and are negative.      Objective:   Physical Exam        Assessment & Plan:    Knee injection Right knee  Indication:Right Knee pain not relieved by medication management and other conservative care.  Informed consent was obtained after describing risks and benefits of the procedure with the patient, this includes bleeding, bruising, infection and medication side effects. The patient wishes to proceed and has given written consent. The patient was placed in a recumbent position. The medial aspect of the knee was marked and prepped with Betadine and alcohol. It was then entered with a 25-gauge 1-1/2 inch needle  was inserted into the knee joint. After negative draw back for blood, a solution containing one ML  of 6mg  per mL betamethasone and 3 mL of 1% lidocaine were injected. The patient tolerated the procedure well. Post procedure instructions were given.

## 2017-06-20 NOTE — Patient Instructions (Signed)
Knee Injection, Care After  Refer to this sheet in the next few weeks. These instructions provide you with information about caring for yourself after your procedure. Your health care provider may also give you more specific instructions. Your treatment has been planned according to current medical practices, but problems sometimes occur. Call your health care provider if you have any problems or questions after your procedure.  What can I expect after the procedure?  After the procedure, it is common to have:   Soreness.   Warmth.   Swelling.    You may have more pain, swelling, and warmth than you did before the injection. This reaction may last for about one day.  Follow these instructions at home:  Bathing   If you were given a bandage (dressing), keep it dry until your health care provider says it can be removed. Ask your health care provider when you can start showering or taking a bath.  Managing pain, stiffness, and swelling   If directed, apply ice to the injection area:  ? Put ice in a plastic bag.  ? Place a towel between your skin and the bag.  ? Leave the ice on for 20 minutes, 2-3 times per day.   Do not apply heat to your knee.   Raise the injection area above the level of your heart while you are sitting or lying down.  Activity   Avoid strenuous activities for as long as directed by your health care provider. Ask your health care provider when you can return to your normal activities.  General instructions   Take medicines only as directed by your health care provider.   Do not take aspirin or other over-the-counter medicines unless your health care provider says you can.   Check your injection site every day for signs of infection. Watch for:  ? Redness, swelling, or pain.  ? Fluid, blood, or pus.   Follow your health care provider's instructions about dressing changes and removal.  Contact a health care provider if:   You have symptoms at your injection site that last longer than  two days after your procedure.   You have redness, swelling, or pain in your injection area.   You have fluid, blood, or pus coming from your injection site.   You have warmth in your injection area.   You have a fever.   Your pain is not controlled with medicine.  Get help right away if:   Your knee turns very red.   Your knee becomes very swollen.   Your knee pain is severe.  This information is not intended to replace advice given to you by your health care provider. Make sure you discuss any questions you have with your health care provider.  Document Released: 03/12/2014 Document Revised: 10/26/2015 Document Reviewed: 12/30/2013  Elsevier Interactive Patient Education  2018 [employer]

## 2017-06-29 ENCOUNTER — Other Ambulatory Visit: Payer: Self-pay | Admitting: Neurology

## 2017-07-01 ENCOUNTER — Other Ambulatory Visit: Payer: Self-pay

## 2017-07-01 MED ORDER — LEVETIRACETAM 750 MG PO TABS: 750.0000 mg | ORAL_TABLET | Freq: Two times a day (BID) | ORAL | 1 refills | Status: DC

## 2017-07-03 LAB — CUP PACEART REMOTE DEVICE CHECK
Battery Remaining Longevity: 125 mo
Battery Remaining Percentage: 95.5 %
Battery Voltage: 3.02 V
Brady Statistic AP VP Percent: 8.9 %
Brady Statistic AP VS Percent: 19 %
Brady Statistic AS VP Percent: 1 %
Brady Statistic AS VS Percent: 72 %
Brady Statistic RA Percent Paced: 25 %
Brady Statistic RV Percent Paced: 10 %
Date Time Interrogation Session: 20190409060019
Implantable Lead Implant Date: 20180821
Implantable Lead Implant Date: 20180821
Implantable Lead Location: 753859
Implantable Lead Location: 753860
Implantable Pulse Generator Implant Date: 20180821
Lead Channel Impedance Value: 450 Ohm
Lead Channel Impedance Value: 530 Ohm
Lead Channel Pacing Threshold Amplitude: 0.625 V
Lead Channel Pacing Threshold Amplitude: 0.75 V
Lead Channel Pacing Threshold Pulse Width: 0.5 ms
Lead Channel Pacing Threshold Pulse Width: 0.5 ms
Lead Channel Sensing Intrinsic Amplitude: 11.8 mV
Lead Channel Sensing Intrinsic Amplitude: 3.5 mV
Lead Channel Setting Pacing Amplitude: 1 V
Lead Channel Setting Pacing Amplitude: 1.625
Lead Channel Setting Pacing Pulse Width: 0.5 ms
Lead Channel Setting Sensing Sensitivity: 2 mV
Pulse Gen Model: 2272
Pulse Gen Serial Number: 8933379

## 2017-08-11 IMAGING — MR MR HEAD W/O CM
4 series · 48 of 48 positions shown · non-contrast
Comparison: MRI brain 06/16/2015. CTA of the head and neck
06/17/2015.

CLINICAL DATA: Progressive right-sided weakness.  Known infarct.

EXAM:
MRI HEAD WITHOUT CONTRAST
TECHNIQUE: Multiplanar, multiecho pulse sequences of the brain and surrounding
structures were obtained without intravenous contrast.

[Series 3: DWI · axial · 3.0mm · 1.09mm/px · z∈[-24,+104]mm · 18 of 90 slices shown (1 of 4)]
[im 1/90]
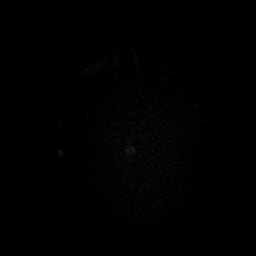
[im 6/90]
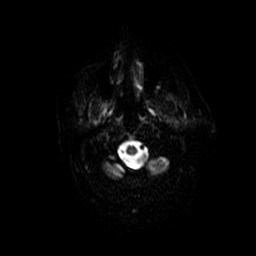
[im 11/90]
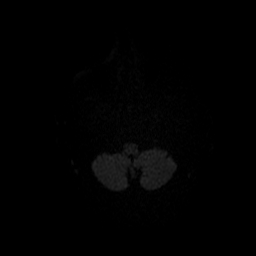
[im 16/90]
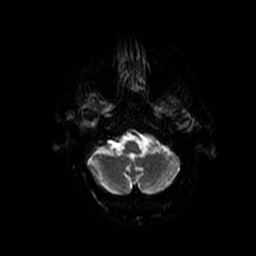
[im 21/90]
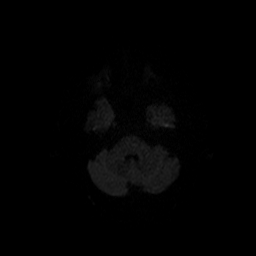
[im 27/90]
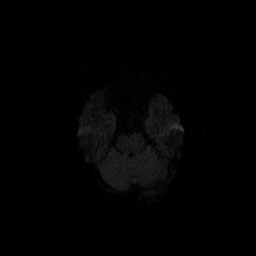
[im 32/90]
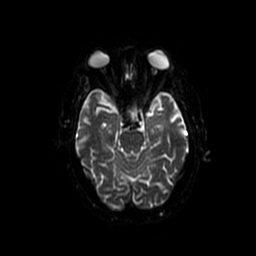
[im 37/90]
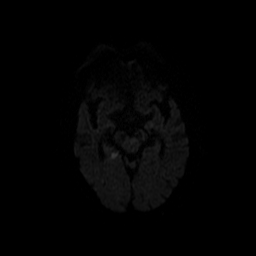
[im 42/90]
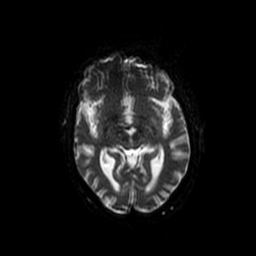
[im 48/90]
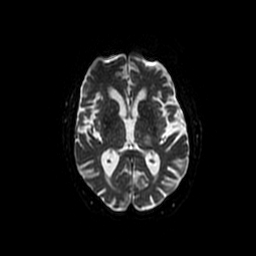
[im 53/90]
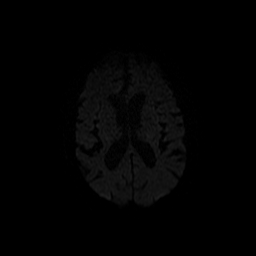
[im 58/90]
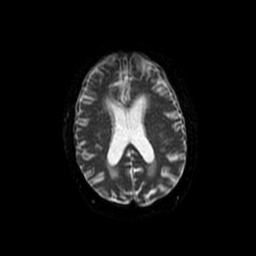
[im 63/90]
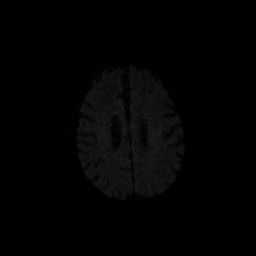
[im 69/90]
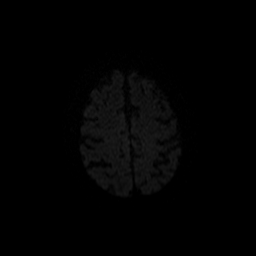
[im 74/90]
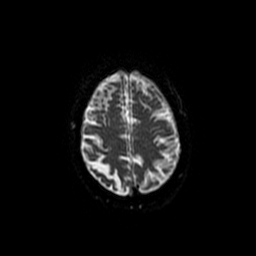
[im 79/90]
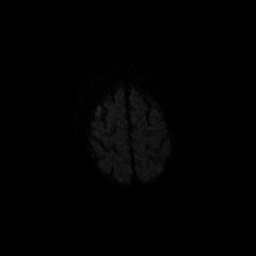
[im 84/90]
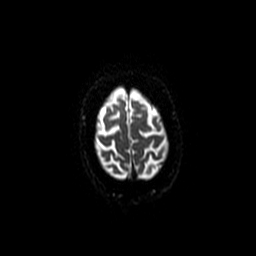
[im 90/90]
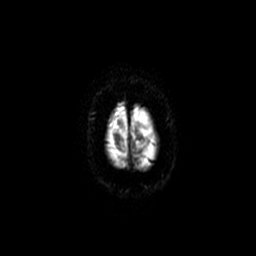

[Series 4: DWI · coronal · 5.0mm · 1.09mm/px · 14 of 66 slices shown (2 of 4)]
[im 1/66]
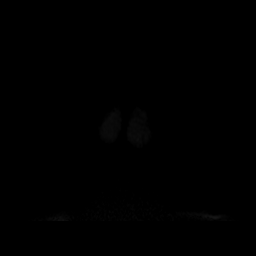
[im 6/66]
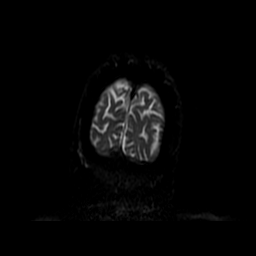
[im 11/66]
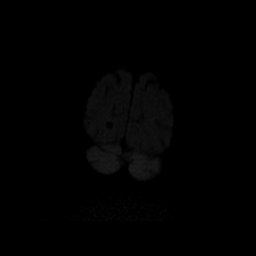
[im 16/66]
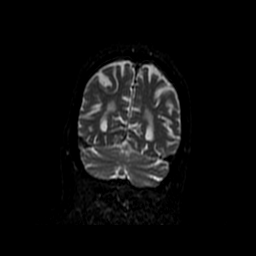
[im 21/66]
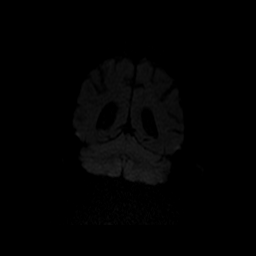
[im 26/66]
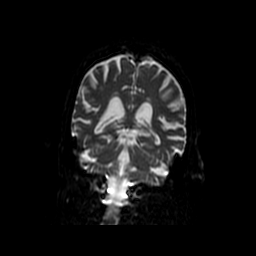
[im 31/66]
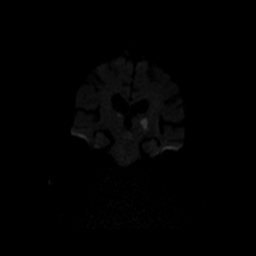
[im 36/66]
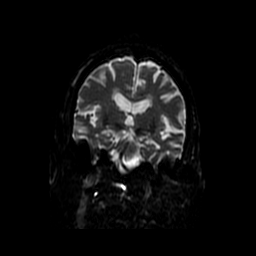
[im 41/66]
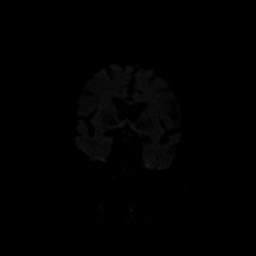
[im 46/66]
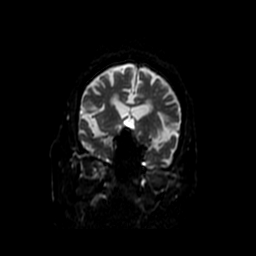
[im 51/66]
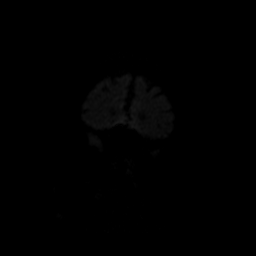
[im 56/66]
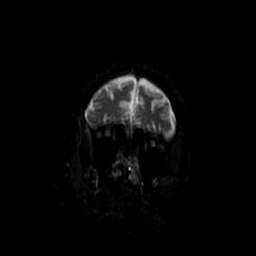
[im 61/66]
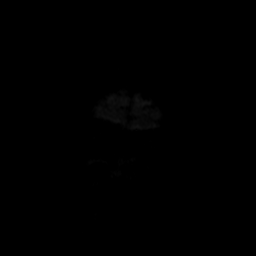
[im 66/66]
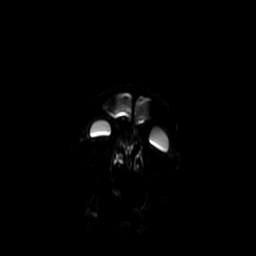

[Series 300: DWI · axial · 3.0mm · 1.09mm/px · z∈[-24,+104]mm · 9 of 45 slices shown (3 of 4)]
[im 1/45]
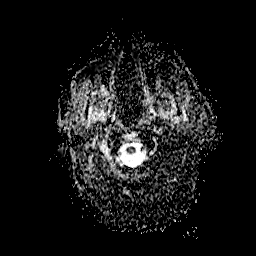
[im 6/45]
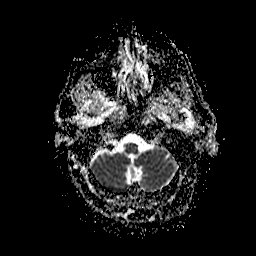
[im 12/45]
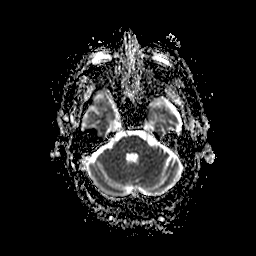
[im 17/45]
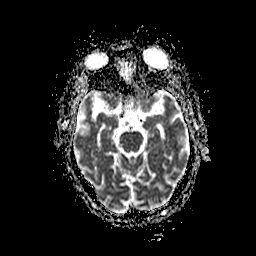
[im 23/45]
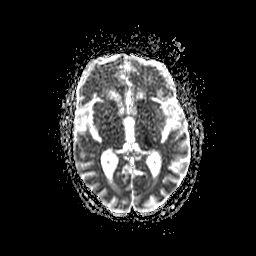
[im 28/45]
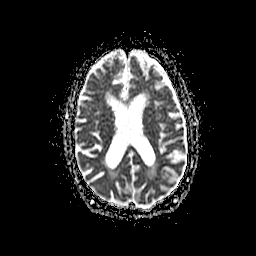
[im 34/45]
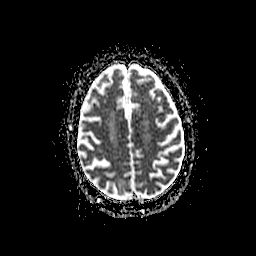
[im 39/45]
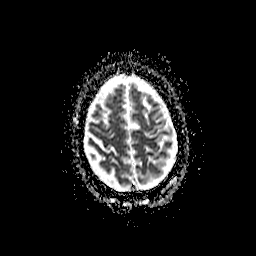
[im 45/45]
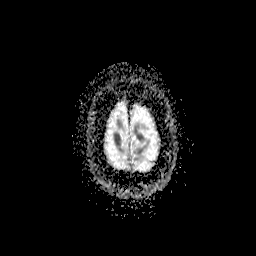

[Series 400: DWI · coronal · 5.0mm · 1.09mm/px · 7 of 33 slices shown (4 of 4)]
[im 1/33]
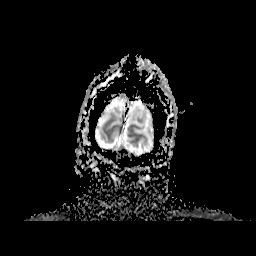
[im 6/33]
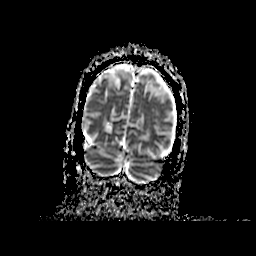
[im 11/33]
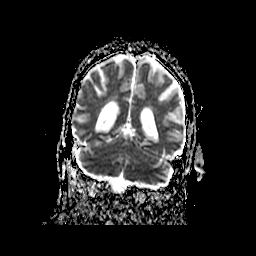
[im 17/33]
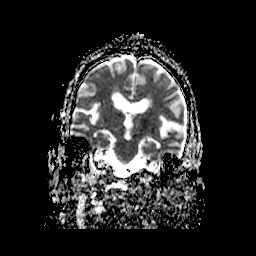
[im 22/33]
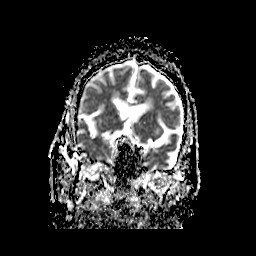
[im 27/33]
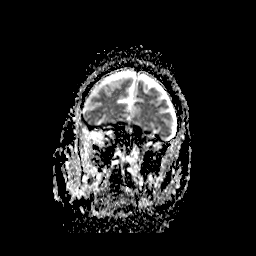
[im 33/33]
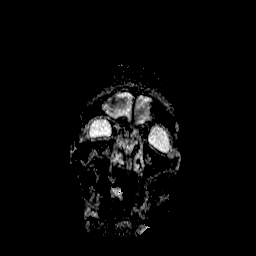

[48 of 48 positions shown; findings below may reference images not displayed]

FINDINGS: The previously noted left colonic infarct is more clearly delineated
on today's study. There is no significant change in size of the
lesion which measures 14 mm maximally.

A medial right thalamic infarct measuring 9 mm maximally is also
better seen on today's study.

A 7 mm medial right temporal lobe infarct is new since the previous
study.
IMPRESSION: 1. Increased conspicuity of the left splenic lesion without
significant change and size.
2. No new left-sided infarcts.
3. A 9 mm medial right thalamic infarct is better visualized on
today's study.
4. New 7 mm medial right temporal lobe infarct suggesting ongoing
disease.

## 2017-08-12 ENCOUNTER — Telehealth: Payer: Self-pay | Admitting: Adult Health

## 2017-08-12 DIAGNOSIS — H9193 Unspecified hearing loss, bilateral: Secondary | ICD-10-CM

## 2017-08-12 NOTE — Telephone Encounter (Signed)
[facility] rep/ Alyse Low called for patient and his wife to request a referral on both for hearing exams--- pls call [phone] if questions-- fax to [phone].  --forwarding request to medical assistant.  --Dion Body

## 2017-08-12 NOTE — Telephone Encounter (Signed)
Referral order placed and faxed to [facility].  Charyl Bigger, CMA

## 2017-08-26 ENCOUNTER — Other Ambulatory Visit: Payer: Self-pay | Admitting: Adult Health

## 2017-08-28 DIAGNOSIS — H903 Sensorineural hearing loss, bilateral: Secondary | ICD-10-CM | POA: Diagnosis not present

## 2017-09-10 ENCOUNTER — Ambulatory Visit (INDEPENDENT_AMBULATORY_CARE_PROVIDER_SITE_OTHER): Payer: Medicare Other | Admitting: *Deleted

## 2017-09-10 DIAGNOSIS — I441 Atrioventricular block, second degree: Secondary | ICD-10-CM

## 2017-09-10 NOTE — Progress Notes (Signed)
Remote pacemaker transmission.   

## 2017-09-14 ENCOUNTER — Other Ambulatory Visit: Payer: Self-pay | Admitting: Adult Health

## 2017-09-24 LAB — CUP PACEART REMOTE DEVICE CHECK
Battery Remaining Longevity: 124 mo
Battery Remaining Percentage: 95.5 %
Battery Voltage: 3.01 V
Brady Statistic AP VP Percent: 9 %
Brady Statistic AP VS Percent: 15 %
Brady Statistic AS VP Percent: 1 %
Brady Statistic AS VS Percent: 75 %
Brady Statistic RA Percent Paced: 23 %
Brady Statistic RV Percent Paced: 10 %
Date Time Interrogation Session: 20190709063336
Implantable Lead Implant Date: 20180821
Implantable Lead Implant Date: 20180821
Implantable Lead Location: 753859
Implantable Lead Location: 753860
Implantable Pulse Generator Implant Date: 20180821
Lead Channel Impedance Value: 440 Ohm
Lead Channel Impedance Value: 550 Ohm
Lead Channel Pacing Threshold Amplitude: 0.75 V
Lead Channel Pacing Threshold Amplitude: 0.75 V
Lead Channel Pacing Threshold Pulse Width: 0.5 ms
Lead Channel Pacing Threshold Pulse Width: 0.5 ms
Lead Channel Sensing Intrinsic Amplitude: 12 mV
Lead Channel Sensing Intrinsic Amplitude: 3.4 mV
Lead Channel Setting Pacing Amplitude: 1 V
Lead Channel Setting Pacing Amplitude: 1.75 V
Lead Channel Setting Pacing Pulse Width: 0.5 ms
Lead Channel Setting Sensing Sensitivity: 2 mV
Pulse Gen Model: 2272
Pulse Gen Serial Number: 8933379

## 2017-09-28 ENCOUNTER — Other Ambulatory Visit: Payer: Self-pay | Admitting: Adult Health

## 2017-10-03 ENCOUNTER — Ambulatory Visit: Payer: Medicare Other | Admitting: Physical Medicine & Rehabilitation

## 2017-10-04 ENCOUNTER — Encounter (HOSPITAL_COMMUNITY): Payer: Self-pay | Admitting: Emergency Medicine

## 2017-10-04 ENCOUNTER — Observation Stay (HOSPITAL_COMMUNITY)
Admission: EM | Admit: 2017-10-04 | Discharge: 2017-10-07 | Disposition: A | Discharge status: Home / Self Care | Payer: Medicare Other | Attending: Internal Medicine | Admitting: Internal Medicine

## 2017-10-04 ENCOUNTER — Observation Stay (HOSPITAL_COMMUNITY): Payer: Medicare Other

## 2017-10-04 ENCOUNTER — Emergency Department (HOSPITAL_COMMUNITY): Payer: Medicare Other

## 2017-10-04 ENCOUNTER — Other Ambulatory Visit: Payer: Self-pay

## 2017-10-04 DIAGNOSIS — Z950 Presence of cardiac pacemaker: Secondary | ICD-10-CM | POA: Diagnosis not present

## 2017-10-04 DIAGNOSIS — F0390 Unspecified dementia without behavioral disturbance: Secondary | ICD-10-CM | POA: Diagnosis not present

## 2017-10-04 DIAGNOSIS — R4701 Aphasia: Secondary | ICD-10-CM | POA: Diagnosis present

## 2017-10-04 DIAGNOSIS — N182 Chronic kidney disease, stage 2 (mild): Secondary | ICD-10-CM | POA: Insufficient documentation

## 2017-10-04 DIAGNOSIS — Z7982 Long term (current) use of aspirin: Secondary | ICD-10-CM | POA: Insufficient documentation

## 2017-10-04 DIAGNOSIS — R0600 Dyspnea, unspecified: Secondary | ICD-10-CM | POA: Diagnosis not present

## 2017-10-04 DIAGNOSIS — I639 Cerebral infarction, unspecified: Secondary | ICD-10-CM | POA: Diagnosis not present

## 2017-10-04 DIAGNOSIS — I6330 Cerebral infarction due to thrombosis of unspecified cerebral artery: Secondary | ICD-10-CM

## 2017-10-04 DIAGNOSIS — I2510 Atherosclerotic heart disease of native coronary artery without angina pectoris: Secondary | ICD-10-CM | POA: Diagnosis not present

## 2017-10-04 DIAGNOSIS — Z79899 Other long term (current) drug therapy: Secondary | ICD-10-CM | POA: Insufficient documentation

## 2017-10-04 DIAGNOSIS — J449 Chronic obstructive pulmonary disease, unspecified: Secondary | ICD-10-CM | POA: Diagnosis not present

## 2017-10-04 DIAGNOSIS — Z87891 Personal history of nicotine dependence: Secondary | ICD-10-CM | POA: Insufficient documentation

## 2017-10-04 DIAGNOSIS — R064 Hyperventilation: Secondary | ICD-10-CM | POA: Diagnosis not present

## 2017-10-04 DIAGNOSIS — Z955 Presence of coronary angioplasty implant and graft: Secondary | ICD-10-CM | POA: Diagnosis not present

## 2017-10-04 DIAGNOSIS — E1122 Type 2 diabetes mellitus with diabetic chronic kidney disease: Secondary | ICD-10-CM | POA: Diagnosis not present

## 2017-10-04 DIAGNOSIS — R0602 Shortness of breath: Secondary | ICD-10-CM | POA: Diagnosis not present

## 2017-10-04 DIAGNOSIS — R404 Transient alteration of awareness: Secondary | ICD-10-CM | POA: Diagnosis not present

## 2017-10-04 DIAGNOSIS — G459 Transient cerebral ischemic attack, unspecified: Secondary | ICD-10-CM | POA: Diagnosis not present

## 2017-10-04 DIAGNOSIS — R410 Disorientation, unspecified: Secondary | ICD-10-CM | POA: Diagnosis not present

## 2017-10-04 DIAGNOSIS — I4891 Unspecified atrial fibrillation: Secondary | ICD-10-CM | POA: Diagnosis not present

## 2017-10-04 DIAGNOSIS — I129 Hypertensive chronic kidney disease with stage 1 through stage 4 chronic kidney disease, or unspecified chronic kidney disease: Secondary | ICD-10-CM | POA: Insufficient documentation

## 2017-10-04 DIAGNOSIS — Z96642 Presence of left artificial hip joint: Secondary | ICD-10-CM | POA: Insufficient documentation

## 2017-10-04 DIAGNOSIS — R0689 Other abnormalities of breathing: Secondary | ICD-10-CM | POA: Diagnosis not present

## 2017-10-04 DIAGNOSIS — G9389 Other specified disorders of brain: Secondary | ICD-10-CM | POA: Diagnosis not present

## 2017-10-04 HISTORY — DX: Aphasia: R47.01

## 2017-10-04 LAB — I-STAT CHEM 8, ED
BUN: 15 mg/dL (ref 8–23)
Calcium, Ion: 1.1 mmol/L — ABNORMAL LOW (ref 1.15–1.40)
Chloride: 99 mmol/L (ref 98–111)
Creatinine, Ser: 1 mg/dL (ref 0.61–1.24)
Glucose, Bld: 100 mg/dL — ABNORMAL HIGH (ref 70–99)
HCT: 44 % (ref 39.0–52.0)
Hemoglobin: 15 g/dL (ref 13.0–17.0)
Potassium: 3.6 mmol/L (ref 3.5–5.1)
Sodium: 133 mmol/L — ABNORMAL LOW (ref 135–145)
TCO2: 20 mmol/L — ABNORMAL LOW (ref 22–32)

## 2017-10-04 LAB — CBC
HCT: 44 % (ref 39.0–52.0)
Hemoglobin: 14.4 g/dL (ref 13.0–17.0)
MCH: 30.6 pg (ref 26.0–34.0)
MCHC: 32.7 g/dL (ref 30.0–36.0)
MCV: 93.6 fL (ref 78.0–100.0)
Platelets: 340 10*3/uL (ref 150–400)
RBC: 4.7 MIL/uL (ref 4.22–5.81)
RDW: 13.3 % (ref 11.5–15.5)
WBC: 13.2 10*3/uL — ABNORMAL HIGH (ref 4.0–10.5)

## 2017-10-04 LAB — DIFFERENTIAL
Abs Immature Granulocytes: 0 10*3/uL (ref 0.0–0.1)
Basophils Absolute: 0.1 10*3/uL (ref 0.0–0.1)
Basophils Relative: 1 %
Eosinophils Absolute: 0.1 10*3/uL (ref 0.0–0.7)
Eosinophils Relative: 1 %
Immature Granulocytes: 0 %
Lymphocytes Relative: 20 %
Lymphs Abs: 2.7 10*3/uL (ref 0.7–4.0)
Monocytes Absolute: 1.2 10*3/uL — ABNORMAL HIGH (ref 0.1–1.0)
Monocytes Relative: 9 %
Neutro Abs: 9.1 10*3/uL — ABNORMAL HIGH (ref 1.7–7.7)
Neutrophils Relative %: 69 %

## 2017-10-04 LAB — URINALYSIS, ROUTINE W REFLEX MICROSCOPIC
Bilirubin Urine: NEGATIVE
Glucose, UA: NEGATIVE mg/dL
Hgb urine dipstick: NEGATIVE
Ketones, ur: 5 mg/dL — AB
Leukocytes, UA: NEGATIVE
Nitrite: NEGATIVE
Protein, ur: NEGATIVE mg/dL
Specific Gravity, Urine: 1.021 (ref 1.005–1.030)
pH: 8 (ref 5.0–8.0)

## 2017-10-04 LAB — RAPID URINE DRUG SCREEN, HOSP PERFORMED
Amphetamines: NOT DETECTED
Barbiturates: NOT DETECTED
Benzodiazepines: NOT DETECTED
Cocaine: NOT DETECTED
Opiates: NOT DETECTED
Tetrahydrocannabinol: NOT DETECTED

## 2017-10-04 LAB — ETHANOL: Alcohol, Ethyl (B): <10 mg/dL (ref <10)

## 2017-10-04 LAB — COMPREHENSIVE METABOLIC PANEL
ALT: 20 U/L (ref 0–44)
AST: 42 U/L — ABNORMAL HIGH (ref 15–41)
Albumin: 3.9 g/dL (ref 3.5–5.0)
Alkaline Phosphatase: 68 U/L (ref 38–126)
Anion gap: 14 (ref 5–15)
BUN: 13 mg/dL (ref 8–23)
CO2: 19 mmol/L — ABNORMAL LOW (ref 22–32)
Calcium: 9.5 mg/dL (ref 8.9–10.3)
Chloride: 101 mmol/L (ref 98–111)
Creatinine, Ser: 1.14 mg/dL (ref 0.61–1.24)
GFR calc Af Amer: >60 mL/min (ref >60)
GFR calc non Af Amer: 55 mL/min — ABNORMAL LOW (ref >60)
Glucose, Bld: 105 mg/dL — ABNORMAL HIGH (ref 70–99)
Potassium: 3.6 mmol/L (ref 3.5–5.1)
Sodium: 134 mmol/L — ABNORMAL LOW (ref 135–145)
Total Bilirubin: 0.8 mg/dL (ref 0.3–1.2)
Total Protein: 6.7 g/dL (ref 6.5–8.1)

## 2017-10-04 LAB — I-STAT TROPONIN, ED: Troponin i, poc: 0 ng/mL (ref 0.00–0.08)

## 2017-10-04 LAB — PROTIME-INR
INR: 1.49
Prothrombin Time: 17.9 seconds — ABNORMAL HIGH (ref 11.4–15.2)

## 2017-10-04 LAB — TSH: TSH: 3.066 u[IU]/mL (ref 0.350–4.500)

## 2017-10-04 LAB — HEMOGLOBIN A1C
Hgb A1c MFr Bld: 5.5 % (ref 4.8–5.6)
Mean Plasma Glucose: 111.15 mg/dL

## 2017-10-04 LAB — APTT: aPTT: 37 seconds — ABNORMAL HIGH (ref 24–36)

## 2017-10-04 MED ORDER — LORAZEPAM 2 MG/ML IJ SOLN
INTRAMUSCULAR | Status: AC
  Administered 2017-10-04: 1 mg
  Filled 2017-10-04: qty 1

## 2017-10-04 MED ORDER — ASPIRIN EC 81 MG PO TBEC
81.0000 mg | DELAYED_RELEASE_TABLET | Freq: Every day | ORAL | Status: DC
  Administered 2017-10-05 – 2017-10-07 (×3): 81 mg via ORAL
  Filled 2017-10-04 (×3): qty 1

## 2017-10-04 MED ORDER — LEVETIRACETAM 750 MG PO TABS
750.0000 mg | ORAL_TABLET | Freq: Two times a day (BID) | ORAL | Status: DC
  Administered 2017-10-05 – 2017-10-07 (×5): 750 mg via ORAL
  Filled 2017-10-04 (×5): qty 1

## 2017-10-04 MED ORDER — ACETAMINOPHEN 650 MG RE SUPP
650.0000 mg | Freq: Four times a day (QID) | RECTAL | Status: DC | PRN
  Filled 2017-10-04: qty 1

## 2017-10-04 MED ORDER — ACETAMINOPHEN 325 MG PO TABS: 650.0000 mg | ORAL_TABLET | Freq: Four times a day (QID) | ORAL | Status: DC | PRN

## 2017-10-04 MED ORDER — QUETIAPINE FUMARATE 25 MG PO TABS
25.0000 mg | ORAL_TABLET | Freq: Every day | ORAL | Status: DC
  Administered 2017-10-05 – 2017-10-06 (×2): 25 mg via ORAL
  Filled 2017-10-04 (×2): qty 1

## 2017-10-04 MED ORDER — SENNOSIDES-DOCUSATE SODIUM 8.6-50 MG PO TABS: 1.0000 | ORAL_TABLET | Freq: Every evening | ORAL | Status: DC | PRN

## 2017-10-04 MED ORDER — IOPAMIDOL (ISOVUE-370) INJECTION 76%
100.0000 mL | Freq: Once | INTRAVENOUS | Status: AC | PRN
  Administered 2017-10-04: 100 mL via INTRAVENOUS

## 2017-10-04 MED ORDER — ATORVASTATIN CALCIUM 80 MG PO TABS
80.0000 mg | ORAL_TABLET | Freq: Every day | ORAL | Status: DC
  Administered 2017-10-05 – 2017-10-06 (×2): 80 mg via ORAL
  Filled 2017-10-04 (×2): qty 1

## 2017-10-04 MED ORDER — APIXABAN 5 MG PO TABS
5.0000 mg | ORAL_TABLET | Freq: Two times a day (BID) | ORAL | Status: DC
  Administered 2017-10-05 – 2017-10-07 (×5): 5 mg via ORAL
  Filled 2017-10-04 (×5): qty 1
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
## 2017-10-04 NOTE — Code Documentation (Signed)
84 yo Male coming from home with family where patient was noted to have trouble getting his words out this morning. Pt was last seen normal at 1130 when he was transferring himself to the couch from his wheelchair. EMS was called and activated a Code Stroke. Stroke team met patient upon arrival. CT head completed and negative for hemorrhage. Pt not tPA candidate due to hemorrhagic conversion with tPA 03/30/2016. Initial NIHSS 9 due to unable to answer questions, follow comands, right facial droop, right arm weakness at baseline right leg weakness at baseline, and severe aphasia. Hx of Stroke in past. Pt agitated and moving in CT scanner. 1 mg of Ativan given in CT by Burman Nieves, RN. CTA/CTP completed. No LVO noted. Not IR candidate. Handoff given to Burman Nieves, Therapist, sports.

## 2017-10-04 NOTE — ED Notes (Signed)
Attempted to give report , nurse not available at this time  

## 2017-10-04 NOTE — ED Triage Notes (Signed)
Pt arrives via EMS as Code Stroke. Pt was LSN at 1130 by wife when pt was getting out of his wheelchair and got onto the couch. Wife reports pt had trouble getting words out and SOB.

## 2017-10-04 NOTE — ED Provider Notes (Signed)
[facility] EMERGENCY DEPARTMENT Provider Note   CSN: [id number] Arrival date & time: 10/04/17  1330     History   Chief Complaint Chief Complaint  Patient presents with  . Code Stroke    HPI Herbert Marquez is a 84 y.o. male.  The history is provided by the EMS personnel. No language interpreter was used.   Herbert Marquez is a 84 y.o. male who presents to the Emergency Department complaining of code stroke.  Level V caveat due to AMS.  Hx is provided by EMS.  He was last in his routine state of health at 1130.  Just prior to ED arrival he was noted to have expressive aphasia.  He lives at home with his wife.   Past Medical History:  Diagnosis Date  . Arthritis    "pretty much all over"   . Basal cell carcinoma    "several burned off his body, face, head"  . BPH (benign prostatic hypertrophy)   . Coronary artery disease    a. s/p PCI of RCA in 2006  . CVA (cerebral infarction)    a. 06/2015: left thalamic and bilateral PCA  . GERD (gastroesophageal reflux disease)   . Hyperlipidemia   . Hypertension   . Presence of permanent cardiac pacemaker   . Seizures ([place])   . Stroke ([place])   . TIA (transient ischemic attack)    Approximately 6 weeks post-cardiac catheterization.     Patient Active Problem List   Diagnosis Date Noted  . TIA (transient ischemic attack)   . Presence of permanent cardiac pacemaker   . Hypertension   . Hyperlipidemia   . Coronary artery disease   . Basal cell carcinoma   . Arthritis   . Pressure injury of skin 01/08/2017  . Stroke ([place]) 01/07/2017  . Ischemic stroke ([place])   . Second degree Mobitz II AV block 10/23/2016  . Seizures ([place]) 10/09/2016  . Healthcare maintenance 10/01/2016  . Restlessness and agitation 07/02/2016  . Vascular dementia with behavior disturbance 06/29/2016  . Hemiparesis and other late effects of cerebrovascular accident ([place]) 06/29/2016  . Embolic stroke ([place]) 05/39/7673  . Right hemiparesis  ([place])   . History of CVA with residual deficit   . Chronic anticoagulation   . Atrial fibrillation with rapid ventricular response ([place])   . Seizure prophylaxis   . Diastolic dysfunction   . Bacteremia due to Gram-positive bacteria   . Altered mental status   . Dementia without behavioral disturbance   . Leukocytosis   . Acute blood loss anemia   . Acute encephalopathy 06/04/2016  . PAF (paroxysmal atrial fibrillation) ([place])   . Iron deficiency anemia 04/04/2016  . History of stroke 04/04/2016  . Cerebral hemorrhage (HCC) w/ SDH s/p IV tPA   . Coronary artery disease involving native coronary artery of native heart without angina pectoris   . Orthostatic hypotension   . History of right hip replacement   . Acute ischemic stroke ([place]) - L temporal lobe s/p tPA 03/30/2016  . BPH (benign prostatic hyperplasia) 11/25/2015  . GERD (gastroesophageal reflux disease) 11/25/2015  . CKD (chronic kidney disease), stage II 11/25/2015  . Overactive bladder   . Cerebrovascular accident (CVA) ([place]) 09/18/2015  . Gait disturbance, post-stroke 06/23/2015  . 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 Thalamic infarction ([place]) 06/21/2015  . Benign essential HTN   . Type 2 diabetes mellitus with complication, without long-term current use of insulin ([place])   . Dysphagia  as late effect of cerebrovascular disease   . Hyponatremia   . HLD (hyperlipidemia)   . COPD (chronic obstructive pulmonary disease) ([place]) 03/09/2011    Past Surgical History:  Procedure Laterality Date  . CARDIOVASCULAR STRESS TEST  07/01/2007   EF 74%  . CATARACT EXTRACTION, BILATERAL    . CORONARY ANGIOPLASTY WITH STENT PLACEMENT  10/2004   stenting x 2 to RCA  . FEMUR IM NAIL Right 11/26/2015   Procedure: INTRAMEDULLARY RIGHT (IM) NAIL FEMORAL;  Surgeon: Rod Can, MD;  Location: [facility];  Service: Orthopedics;  Laterality: Right;  . FRACTURE SURGERY    . HERNIA REPAIR    . HIP ARTHROPLASTY  03/09/2011   Procedure:  ARTHROPLASTY BIPOLAR HIP;  Surgeon: Mauri Pole;  Location: [facility];  Service: Orthopedics;  Laterality: Left;  . INSERT / REPLACE / REMOVE PACEMAKER  10/23/2016  . LAPAROSCOPIC INCISIONAL / UMBILICAL / VENTRAL HERNIA REPAIR     "below his naval"  . PACEMAKER IMPLANT N/A 10/23/2016   Procedure: Pacemaker Implant;  Surgeon: Thompson Grayer, MD;  Location: [facility];  Service: Cardiovascular;  Laterality: N/A;        Home Medications    Prior to Admission medications   Medication Sig Start Date End Date Taking? Authorizing Provider  acetaminophen (TYLENOL) 325 MG tablet Take 2 tablets (650 mg total) every 4 (four) hours as needed by mouth for mild pain (or temp > 37.5 C (99.5 F)). 01/10/17  Yes Regalado, Belkys A, MD  apixaban (ELIQUIS) 5 MG TABS tablet Take 1 tablet (5 mg total) by mouth 2 (two) times daily. Resume 10/25/16 evening dose 04/29/17  Yes Allred, Jeneen Rinks, MD  aspirin EC 81 MG tablet Take 1 tablet (81 mg total) by mouth daily. 10/09/16  Yes Rosalin Hawking, MD  atorvastatin (LIPITOR) 80 MG tablet TAKE 1 TABLET BY MOUTH DAILY. PATIENT MUST HAVE OFFICE VISIT PRIOR TO ANY FURTHER REFILLS 09/30/17  Yes Danford, Valetta Fuller D, NP  ENSURE (ENSURE) Take 237 mLs by mouth daily as needed (nutrition).   Yes [provider]  levETIRAcetam (KEPPRA) 750 MG tablet Take 1 tablet (750 mg total) by mouth 2 (two) times daily. 07/01/17  Yes Rosalin Hawking, MD  mirabegron ER (MYRBETRIQ) 25 MG TB24 tablet Take 25 mg by mouth daily.   Yes [provider]  Multiple Vitamin (MULTIVITAMIN WITH MINERALS) TABS tablet Take 1 tablet by mouth daily.   Yes [provider]  nitroGLYCERIN (NITROSTAT) 0.4 MG SL tablet Place 1 tablet (0.4 mg total) under the tongue every 5 (five) minutes as needed for chest pain. 07/23/16  Yes Nahser, Wonda Cheng, MD  phenazopyridine (PYRIDIUM) 95 MG tablet Take 95 mg by mouth 3 (three) times daily as needed for pain.   Yes [provider]  phenylephrine-shark liver  oil-mineral oil-petrolatum (PREPARATION H) 0.25-3-14-71.9 % rectal ointment Place 1 application rectally as needed for hemorrhoids.   Yes [provider]  polyvinyl alcohol (ARTIFICIAL TEARS) 1.4 % ophthalmic solution Place 1 drop into both eyes daily as needed for dry eyes.   Yes [provider]  QUEtiapine (SEROQUEL) 25 MG tablet Take 1 tablet (25 mg total) by mouth at bedtime. PATIENT MUST HAVE OFFICE VISIT PRIOR TO ANY FURTHER REFILLS 08/26/17  Yes Danford, Valetta Fuller D, NP  ranitidine (ZANTAC) 150 MG tablet Take 150 mg by mouth as needed for heartburn.   Yes [provider]    Family History Family History  Problem Relation Age of Onset  . Hypertension Mother   .  Lung cancer Father   . Lung cancer Brother     Social History Social History   Tobacco Use  . Smoking status: Former Smoker    Last attempt to quit: 03/06/1971    Years since quitting: 46.6  . Smokeless tobacco: Never Used  Substance Use Topics  . Alcohol use: Yes    Alcohol/week: 4.8 oz    Types: 7 Glasses of wine, 1 Cans of beer per week    Comment: 1/2 BEER AND 1 WINE  . Drug use: No     Allergies   Patient has no known allergies.   Review of Systems Review of Systems  All other systems reviewed and are negative.    Physical Exam Updated Vital Signs BP 117/84   Pulse 78   Temp (!) 97.5 F (36.4 C) (Oral)   Resp (!) 24   Ht 5\' 10"  (1.778 m)   Wt 73.3 kg (161 lb 9.6 oz)   SpO2 98%   BMI 23.19 kg/m   Physical Exam  Constitutional: He appears well-developed and well-nourished.  HENT:  Head: Normocephalic and atraumatic.  Cardiovascular: Normal rate and regular rhythm.  No murmur heard. Pulmonary/Chest: Effort normal and breath sounds normal. No respiratory distress.  Abdominal: Soft. There is no tenderness. There is no rebound and no guarding.  Musculoskeletal: He exhibits no edema or tenderness.  Neurological: He is alert.  Garbled speech.  Expressive aphasia.  Mild  weakness to RUE, RLE.  Follows one step commands.    Skin: Skin is warm and dry.  Psychiatric: He has a normal mood and affect. His behavior is normal.  Nursing note and vitals reviewed.    ED Treatments / Results  Labs (all labs ordered are listed, but only abnormal results are displayed) Labs Reviewed  PROTIME-INR - Abnormal; Notable for the following components:      Result Value   Prothrombin Time 17.9 (*)    All other components within normal limits  APTT - Abnormal; Notable for the following components:   aPTT 37 (*)    All other components within normal limits  CBC - Abnormal; Notable for the following components:   WBC 13.2 (*)    All other components within normal limits  DIFFERENTIAL - Abnormal; Notable for the following components:   Neutro Abs 9.1 (*)    Monocytes Absolute 1.2 (*)    All other components within normal limits  COMPREHENSIVE METABOLIC PANEL - Abnormal; Notable for the following components:   Sodium 134 (*)    CO2 19 (*)    Glucose, Bld 105 (*)    AST 42 (*)    GFR calc non Af Amer 55 (*)    All other components within normal limits  URINALYSIS, ROUTINE W REFLEX MICROSCOPIC - Abnormal; Notable for the following components:   Ketones, ur 5 (*)    All other components within normal limits  I-STAT CHEM 8, ED - Abnormal; Notable for the following components:   Sodium 133 (*)    Glucose, Bld 100 (*)    Calcium, Ion 1.10 (*)    TCO2 20 (*)    All other components within normal limits  ETHANOL  RAPID URINE DRUG SCREEN, HOSP PERFORMED  HEMOGLOBIN A1C  I-STAT TROPONIN, ED  CBG MONITORING, ED  I-STAT CHEM 8, ED  I-STAT CHEM 8, ED  I-STAT TROPONIN, ED    EKG EKG Interpretation  Date/Time:  Friday October 04 2017 14:21:31 EDT Ventricular Rate:  73 PR Interval:  QRS Duration: 159 QT Interval:  460 QTC Calculation: 507 R Axis:   -87 Text Interpretation:  Sinus rhythm Prolonged PR interval RBBB and LAFB Confirmed by Quintella Reichert [phone]) on  10/04/2017 2:27:27 PM   Radiology Ct Angio Head W Or Wo Contrast  Result Date: 10/04/2017 CLINICAL DATA:  Cerebral hemorrhage suspected. Negative CT head today. EXAM: CT ANGIOGRAPHY HEAD AND NECK CT PERFUSION BRAIN TECHNIQUE: Multidetector CT imaging of the head and neck was performed using the standard protocol during bolus administration of intravenous contrast. Multiplanar CT image reconstructions and MIPs were obtained to evaluate the vascular anatomy. Carotid stenosis measurements (when applicable) are obtained utilizing NASCET criteria, using the distal internal carotid diameter as the denominator. Multiphase CT imaging of the brain was performed following IV bolus contrast injection. Subsequent parametric perfusion maps were calculated using RAPID software. CONTRAST:  177mL ISOVUE-370 IOPAMIDOL (ISOVUE-370) INJECTION 76% COMPARISON:  CT head earlier today, negative for acute hemorrhage. CTA head and neck 01/07/2017 FINDINGS: CTA NECK FINDINGS Aortic arch: Atherosclerotic calcification aortic arch and proximal great vessels without significant stenosis. Negative for aneurysm or dissection. Right carotid system: The patient moved during scanning through the bifurcation limiting evaluation. There is atherosclerotic disease at the right carotid bifurcation. Estimated 50% diameter stenosis right internal carotid artery unchanged from the prior CTA Left carotid system: Patient moved during scanning through the bifurcation causing blurring. Atherosclerotic calcification in the proximal left internal carotid artery with approximately 40% diameter stenosis unchanged. 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 Left vertebral artery dominant and widely patent without stenosis. Skeleton: 5 mm anterolisthesis C7-T1 unchanged. Mild anterolisthesis C4-5 and C5-6 unchanged. Multilevel degenerative change without acute skeletal abnormality. Other neck: Negative  for mass or adenopathy. Upper chest: Extensive apical emphysema and scarring. Review of the MIP images confirms the above findings CTA HEAD FINDINGS Anterior circulation: Atherosclerotic calcification in the cavernous carotid bilaterally with moderate stenosis bilaterally. Anterior and middle cerebral arteries patent bilaterally without large vessel occlusion. Mild atherosclerotic irregularity in the M2 branches bilaterally. Both anterior cerebral arteries patent without significant stenosis. Posterior circulation: Chronic occlusion distal right vertebral artery. Left vertebral artery widely patent the basilar. Left PICA patent. Right PICA patent and supplied from retrograde flow in the distal right vertebral artery. Basilar widely patent. Left AICA patent. Right AICA not visualized. Superior cerebellar and posterior cerebral arteries patent bilaterally without stenosis. Venous sinuses: Not well opacified due to timing of the scan. Anatomic variants: None Delayed phase: Not perform Review of the MIP images confirms the above findings CT Brain Perfusion Findings: CBF (<30%) Volume: 67mL Perfusion (Tmax>6.0s) volume: 74mL Mismatch Volume: 38mL Infarction Location:Right posterior cerebellum IMPRESSION: 1. CT perfusion suggests delayed perfusion in the right posterior cerebellum without core infarct. 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 MRI would be necessary to determine if acute infarct is present. 2. Atherosclerotic calcification of the carotid bifurcation bilaterally and in the cavernous carotid bilaterally without flow limiting stenosis. 3. Negative for emergent large vessel intracranial occlusion. Electronically Signed   By: Franchot Gallo M.D.   On: 10/04/2017 14:28   Ct Angio Neck W Or Wo Contrast  Result Date: 10/04/2017 CLINICAL DATA:  Cerebral hemorrhage suspected. Negative CT head  today. EXAM: CT ANGIOGRAPHY HEAD AND NECK CT PERFUSION BRAIN TECHNIQUE: Multidetector CT imaging of the head and neck was performed using the standard protocol during  bolus administration of intravenous contrast. Multiplanar CT image reconstructions and MIPs were obtained to evaluate the vascular anatomy. Carotid stenosis measurements (when applicable) are obtained utilizing NASCET criteria, using the distal internal carotid diameter as the denominator. Multiphase CT imaging of the brain was performed following IV bolus contrast injection. Subsequent parametric perfusion maps were calculated using RAPID software. CONTRAST:  174mL ISOVUE-370 IOPAMIDOL (ISOVUE-370) INJECTION 76% COMPARISON:  CT head earlier today, negative for acute hemorrhage. CTA head and neck 01/07/2017 FINDINGS: CTA NECK FINDINGS Aortic arch: Atherosclerotic calcification aortic arch and proximal great vessels without significant stenosis. Negative for aneurysm or dissection. Right carotid system: The patient moved during scanning through the bifurcation limiting evaluation. There is atherosclerotic disease at the right carotid bifurcation. Estimated 50% diameter stenosis right internal carotid artery unchanged from the prior CTA Left carotid system: Patient moved during scanning through the bifurcation causing blurring. Atherosclerotic calcification in the proximal left internal carotid artery with approximately 40% diameter stenosis unchanged. 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 Left vertebral artery dominant and widely patent without stenosis. Skeleton: 5 mm anterolisthesis C7-T1 unchanged. Mild anterolisthesis C4-5 and C5-6 unchanged. Multilevel degenerative change without acute skeletal abnormality. Other neck: Negative for mass or adenopathy. Upper chest: Extensive apical emphysema and scarring. Review of the MIP images confirms the above findings CTA HEAD FINDINGS  Anterior circulation: Atherosclerotic calcification in the cavernous carotid bilaterally with moderate stenosis bilaterally. Anterior and middle cerebral arteries patent bilaterally without large vessel occlusion. Mild atherosclerotic irregularity in the M2 branches bilaterally. Both anterior cerebral arteries patent without significant stenosis. Posterior circulation: Chronic occlusion distal right vertebral artery. Left vertebral artery widely patent the basilar. Left PICA patent. Right PICA patent and supplied from retrograde flow in the distal right vertebral artery. Basilar widely patent. Left AICA patent. Right AICA not visualized. Superior cerebellar and posterior cerebral arteries patent bilaterally without stenosis. Venous sinuses: Not well opacified due to timing of the scan. Anatomic variants: None Delayed phase: Not perform Review of the MIP images confirms the above findings CT Brain Perfusion Findings: CBF (<30%) Volume: 80mL Perfusion (Tmax>6.0s) volume: 62mL Mismatch Volume: 18mL Infarction Location:Right posterior cerebellum IMPRESSION: 1. CT perfusion suggests delayed perfusion in the right posterior cerebellum without core infarct. 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 MRI would be necessary to determine if acute infarct is present. 2. Atherosclerotic calcification of the carotid bifurcation bilaterally and in the cavernous carotid bilaterally without flow limiting stenosis. 3. Negative for emergent large vessel intracranial occlusion. Electronically Signed   By: Franchot Gallo M.D.   On: 10/04/2017 14:28   Ct Cerebral Perfusion W Contrast  Result Date: 10/04/2017 CLINICAL DATA:  Cerebral hemorrhage suspected. Negative CT head today. EXAM: CT ANGIOGRAPHY HEAD AND NECK CT PERFUSION BRAIN TECHNIQUE: Multidetector CT imaging of the head and neck was performed using the standard  protocol during bolus administration of intravenous contrast. Multiplanar CT image reconstructions and MIPs were obtained to evaluate the vascular anatomy. Carotid stenosis measurements (when applicable) are obtained utilizing NASCET criteria, using the distal internal carotid diameter as the denominator. Multiphase CT imaging of the brain was performed following IV bolus contrast injection. Subsequent parametric perfusion maps were calculated using RAPID software. CONTRAST:  150mL ISOVUE-370 IOPAMIDOL (ISOVUE-370) INJECTION 76% COMPARISON:  CT head earlier today, negative for acute hemorrhage. CTA head and neck 01/07/2017 FINDINGS: CTA NECK FINDINGS Aortic  arch: Atherosclerotic calcification aortic arch and proximal great vessels without significant stenosis. Negative for aneurysm or dissection. Right carotid system: The patient moved during scanning through the bifurcation limiting evaluation. There is atherosclerotic disease at the right carotid bifurcation. Estimated 50% diameter stenosis right internal carotid artery unchanged from the prior CTA Left carotid system: Patient moved during scanning through the bifurcation causing blurring. Atherosclerotic calcification in the proximal left internal carotid artery with approximately 40% diameter stenosis unchanged. 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 Left vertebral artery dominant and widely patent without stenosis. Skeleton: 5 mm anterolisthesis C7-T1 unchanged. Mild anterolisthesis C4-5 and C5-6 unchanged. Multilevel degenerative change without acute skeletal abnormality. Other neck: Negative for mass or adenopathy. Upper chest: Extensive apical emphysema and scarring. Review of the MIP images confirms the above findings CTA HEAD FINDINGS Anterior circulation: Atherosclerotic calcification in the cavernous carotid bilaterally with moderate stenosis bilaterally. Anterior and middle cerebral  arteries patent bilaterally without large vessel occlusion. Mild atherosclerotic irregularity in the M2 branches bilaterally. Both anterior cerebral arteries patent without significant stenosis. Posterior circulation: Chronic occlusion distal right vertebral artery. Left vertebral artery widely patent the basilar. Left PICA patent. Right PICA patent and supplied from retrograde flow in the distal right vertebral artery. Basilar widely patent. Left AICA patent. Right AICA not visualized. Superior cerebellar and posterior cerebral arteries patent bilaterally without stenosis. Venous sinuses: Not well opacified due to timing of the scan. Anatomic variants: None Delayed phase: Not perform Review of the MIP images confirms the above findings CT Brain Perfusion Findings: CBF (<30%) Volume: 27mL Perfusion (Tmax>6.0s) volume: 68mL Mismatch Volume: 36mL Infarction Location:Right posterior cerebellum IMPRESSION: 1. CT perfusion suggests delayed perfusion in the right posterior cerebellum without core infarct. 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 MRI would be necessary to determine if acute infarct is present. 2. Atherosclerotic calcification of the carotid bifurcation bilaterally and in the cavernous carotid bilaterally without flow limiting stenosis. 3. Negative for emergent large vessel intracranial occlusion. Electronically Signed   By: Franchot Gallo M.D.   On: 10/04/2017 14:28   Ct Head Code Stroke Wo Contrast  Result Date: 10/04/2017 CLINICAL DATA:  Code stroke. Confusion. Last seen well 2 hours ago. TIA. EXAM: CT HEAD WITHOUT CONTRAST TECHNIQUE: Contiguous axial images were obtained from the base of the skull through the vertex without intravenous contrast. COMPARISON:  MRI of the brain 01/08/2017. FINDINGS: Brain: Remote lacunar infarcts involving the left thalamus is again noted. Basal ganglia  are otherwise intact. Insular ribbon is normal bilaterally. Diffuse atrophy and white matter disease is present bilaterally. Acute hemorrhage or mass lesion is present. Ventricles are proportionate to the degree of atrophy. No significant extra-axial fluid collection present. The brainstem and cerebellum are normal. Vascular: Atherosclerotic calcifications are present within the cavernous internal carotid arteries bilaterally. There is no hyperdense vessel. Skull: Calvarium is intact. No focal lytic or blastic lesions are present. Extracranial soft tissues are within normal limits. Sinuses/Orbits: The paranasal sinuses and mastoid air cells are clear. Globes and orbits are within normal limits. ASPECTS [facility] Stroke Program Early CT Score) - Ganglionic level infarction (caudate, lentiform nuclei, internal capsule, insula, M1-M3 cortex): 7/7 - Supraganglionic infarction (M4-M6 cortex): 3/3 Total score (0-10 with 10 being normal): 10/10 IMPRESSION: 1. No acute intracranial abnormality or significant interval change. 2. Remote lacunar infarct of the left thalamus. 3. Stable  chronic advanced atrophy and white matter disease. This likely reflects the sequela of chronic microvascular ischemia. 4. ASPECTS is 10/10 The above was relayed via text pager to Dr. Kerney Elbe on 10/04/2017 at 13:52 . Electronically Signed   By: San Morelle M.D.   On: 10/04/2017 13:52    Procedures Procedures (including critical care time)  Medications Ordered in ED Medications  LORazepam (ATIVAN) 2 MG/ML injection (1 mg  Given 10/04/17 1348)  iopamidol (ISOVUE-370) 76 % injection 100 mL (100 mLs Intravenous Contrast Given 10/04/17 1353)     Initial Impression / Assessment and Plan / ED Course  I have reviewed the triage vital signs and the nursing notes.  Pertinent labs & imaging results that were available during my care of the patient were reviewed by me and considered in my medical decision making (see chart for  details).     Patient presents as a code stroke with expressive aphasia.  Neurology evaluated patient on ED arrival. Concern for recurrent stroke but patient is not a TPA candidate due to hx/o bleeding with prior TPA.  Medicine consulted for admission for ongoing treatment.    Final Clinical Impressions(s) / ED Diagnoses   Final diagnoses:  None    ED Discharge Orders    None       Quintella Reichert, MD 10/04/17 [phone]
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
## 2017-10-04 NOTE — ED Notes (Addendum)
Patient transported to X-ray, will be taken to floor upon return. MRI called and stated will not be able to perform MRI based on the type of pacemaker the patient has. Provider paged.

## 2017-10-04 NOTE — ED Notes (Addendum)
Patient transported to CT 

## 2017-10-05 ENCOUNTER — Encounter (HOSPITAL_COMMUNITY): Payer: Self-pay

## 2017-10-05 ENCOUNTER — Observation Stay (HOSPITAL_COMMUNITY): Payer: Medicare Other

## 2017-10-05 DIAGNOSIS — R4701 Aphasia: Secondary | ICD-10-CM

## 2017-10-05 DIAGNOSIS — R29810 Facial weakness: Secondary | ICD-10-CM

## 2017-10-05 DIAGNOSIS — Z8673 Personal history of transient ischemic attack (TIA), and cerebral infarction without residual deficits: Secondary | ICD-10-CM

## 2017-10-05 DIAGNOSIS — I480 Paroxysmal atrial fibrillation: Secondary | ICD-10-CM | POA: Diagnosis not present

## 2017-10-05 DIAGNOSIS — I129 Hypertensive chronic kidney disease with stage 1 through stage 4 chronic kidney disease, or unspecified chronic kidney disease: Secondary | ICD-10-CM | POA: Diagnosis not present

## 2017-10-05 DIAGNOSIS — Z8639 Personal history of other endocrine, nutritional and metabolic disease: Secondary | ICD-10-CM | POA: Diagnosis not present

## 2017-10-05 DIAGNOSIS — Z950 Presence of cardiac pacemaker: Secondary | ICD-10-CM | POA: Diagnosis not present

## 2017-10-05 DIAGNOSIS — G40909 Epilepsy, unspecified, not intractable, without status epilepticus: Secondary | ICD-10-CM | POA: Diagnosis not present

## 2017-10-05 DIAGNOSIS — I10 Essential (primary) hypertension: Secondary | ICD-10-CM

## 2017-10-05 DIAGNOSIS — F0390 Unspecified dementia without behavioral disturbance: Secondary | ICD-10-CM | POA: Diagnosis not present

## 2017-10-05 DIAGNOSIS — G459 Transient cerebral ischemic attack, unspecified: Secondary | ICD-10-CM | POA: Diagnosis not present

## 2017-10-05 DIAGNOSIS — I63312 Cerebral infarction due to thrombosis of left middle cerebral artery: Secondary | ICD-10-CM

## 2017-10-05 DIAGNOSIS — I4510 Unspecified right bundle-branch block: Secondary | ICD-10-CM

## 2017-10-05 DIAGNOSIS — Z955 Presence of coronary angioplasty implant and graft: Secondary | ICD-10-CM | POA: Diagnosis not present

## 2017-10-05 DIAGNOSIS — J449 Chronic obstructive pulmonary disease, unspecified: Secondary | ICD-10-CM | POA: Diagnosis not present

## 2017-10-05 DIAGNOSIS — I440 Atrioventricular block, first degree: Secondary | ICD-10-CM | POA: Diagnosis not present

## 2017-10-05 DIAGNOSIS — I6330 Cerebral infarction due to thrombosis of unspecified cerebral artery: Secondary | ICD-10-CM

## 2017-10-05 DIAGNOSIS — Z7901 Long term (current) use of anticoagulants: Secondary | ICD-10-CM

## 2017-10-05 DIAGNOSIS — Z79899 Other long term (current) drug therapy: Secondary | ICD-10-CM | POA: Diagnosis not present

## 2017-10-05 DIAGNOSIS — E785 Hyperlipidemia, unspecified: Secondary | ICD-10-CM | POA: Diagnosis not present

## 2017-10-05 DIAGNOSIS — Z96642 Presence of left artificial hip joint: Secondary | ICD-10-CM | POA: Diagnosis not present

## 2017-10-05 DIAGNOSIS — Z7982 Long term (current) use of aspirin: Secondary | ICD-10-CM

## 2017-10-05 DIAGNOSIS — E1122 Type 2 diabetes mellitus with diabetic chronic kidney disease: Secondary | ICD-10-CM | POA: Diagnosis not present

## 2017-10-05 DIAGNOSIS — N182 Chronic kidney disease, stage 2 (mild): Secondary | ICD-10-CM | POA: Diagnosis not present

## 2017-10-05 DIAGNOSIS — I2510 Atherosclerotic heart disease of native coronary artery without angina pectoris: Secondary | ICD-10-CM | POA: Diagnosis not present

## 2017-10-05 HISTORY — DX: Cerebral infarction due to thrombosis of unspecified cerebral artery: I63.30

## 2017-10-05 LAB — COMPREHENSIVE METABOLIC PANEL
ALT: 18 U/L (ref 0–44)
AST: 37 U/L (ref 15–41)
Albumin: 3.3 g/dL — ABNORMAL LOW (ref 3.5–5.0)
Alkaline Phosphatase: 58 U/L (ref 38–126)
Anion gap: 10 (ref 5–15)
BUN: 12 mg/dL (ref 8–23)
CO2: 21 mmol/L — ABNORMAL LOW (ref 22–32)
Calcium: 8.8 mg/dL — ABNORMAL LOW (ref 8.9–10.3)
Chloride: 103 mmol/L (ref 98–111)
Creatinine, Ser: 1.1 mg/dL (ref 0.61–1.24)
GFR calc Af Amer: >60 mL/min (ref >60)
GFR calc non Af Amer: 58 mL/min — ABNORMAL LOW (ref >60)
Glucose, Bld: 91 mg/dL (ref 70–99)
Potassium: 4.2 mmol/L (ref 3.5–5.1)
Sodium: 134 mmol/L — ABNORMAL LOW (ref 135–145)
Total Bilirubin: 1 mg/dL (ref 0.3–1.2)
Total Protein: 5.6 g/dL — ABNORMAL LOW (ref 6.5–8.1)

## 2017-10-05 LAB — CBC
HCT: 39.2 % (ref 39.0–52.0)
Hemoglobin: 13.1 g/dL (ref 13.0–17.0)
MCH: 31 pg (ref 26.0–34.0)
MCHC: 33.4 g/dL (ref 30.0–36.0)
MCV: 92.7 fL (ref 78.0–100.0)
Platelets: 289 10*3/uL (ref 150–400)
RBC: 4.23 MIL/uL (ref 4.22–5.81)
RDW: 13.3 % (ref 11.5–15.5)
WBC: 9.3 10*3/uL (ref 4.0–10.5)

## 2017-10-05 LAB — LIPID PANEL
Cholesterol: 94 mg/dL (ref 0–200)
HDL: 31 mg/dL — ABNORMAL LOW (ref >40)
LDL Cholesterol: 52 mg/dL (ref 0–99)
Total CHOL/HDL Ratio: 3 RATIO
Triglycerides: 53 mg/dL (ref <150)
VLDL: 11 mg/dL (ref 0–40)

## 2017-10-05 MED ORDER — HYDROCODONE-ACETAMINOPHEN 5-325 MG PO TABS: 1.0000 | ORAL_TABLET | Freq: Once | ORAL | Status: DC | PRN

## 2017-10-05 NOTE — Progress Notes (Addendum)
Subjective: Herbert Marquez was seen lying in bed this morning, alert awake and oriented x3. He was no longer having expressive aphasia and able to fully verbally communicate during the interview, appropriately answering questions. He reported feeling fine and as if he didn't even have a stroke. He did not have any facial drooping on exam or slurred speech. He had slight deviation of his tongue towards the left and slight right sided weakness. He denied SOB, headaches, blurry vision or chest pain. He was aware of where he was and the preceding events that led up to his hospital admission. He remembered feeling SOB and that he was brought here.   PT recommended SNF placement. Discussed this with Herbert Marquez and he is in agreement with this plan. His daughter was there and agreed. He has previously been stayed at [facility] and prefers to go here because it is close to his home.   Objective:  Vital signs in last 24 hours: Vitals:   10/05/17 0020 10/05/17 0220 10/05/17 0347 10/05/17 0803  BP: (!) 107/91 131/76  122/80  Pulse: 65 60 62 60  Resp: 18 18 (!) 22 20  Temp: 98.5 F (36.9 C) 98.4 F (36.9 C) 97.9 F (36.6 C) 98.2 F (36.8 C)  TempSrc: Oral Oral Oral Oral  SpO2: 100% 95%  99%  Weight:   164 lb 10.9 oz (74.7 kg)   Height:       Physical Exam: General: seen lying in bed in no acute distress, awake and able to communicate Cardio: RRR, no murmurs Neuro: negative findings except for slight deviation of the tongue to the left, slight right sided weakness 4/5 strength  Assessment/Plan:  Herbert Marquez is an 84 year old male with a medical history significant of CVA, seizure disorder, PAF on eliquis, DM, HTN, hyperlipidemia, and presence of permanent cardiac pacemaker who presents with stroke like symptoms of expressive dysphagia, confusion and SOB. A code stroke was called on arrival. He was not given tPA due to a history of hemorraghic conversion on tPA in 01/18. His presentation is concerning for a  CVA due to his history and risk factors vs seizure disorder (unlikely but due to similar presentation per family). His CT scan was unremarkable, MRI was ordered in order to determine if acute infarct is present. This was declined yesterday but reordered today after confirming with [facility] that he is MRI compatible. He has improved clinically since admission, no longer experience expressive aphasia, SOB or confusion. He was able to fully communicate during our assessment and answered questions appropriately. We plan on having a brain MRI done today so he can go home. Otherwise, repeat CT in 3 days which will prolong hospital stay.   Principal Problem:   Expressive aphasia Active Problems:   Cerebral thrombosis with cerebral infarction   Principal Problem:   Expressive aphasia  Possible CVA: Herbert Marquez has a history of stroke with similar presenting symptoms. MRI was reordered to help determine if this is an acute infarct and prevent 3 days of stay for repeat CT. Per chart review, he had his MRI compatible cardiac pacemaker placed October 2018 and an MRI was done post pacemaker placement in November 2018. A representative was contacted by Herbert Marquez to confirm safety and he will be here later today to assist with the MRI. His risk factors include prior stoke, HTN and CAD. Will continue his home medications atorvastatin, apixaban and ASA per neuro recommendations. He passed the stroke swallow screen per neuro recommendations.   -  CXR ordered due to SOB, findings-stable interstitial fibrosis with no acute changes -MRI pending -Continue atorvastatin, apixaban, and ASA  Hypertension: 122/80 this morning. Per neurology recommendations, permissive HTN for 24 hours. Per chart review, he is not on any antihypertensives, with well controlled BP since 2015.  DM: Patient has a previous diagnosis of DM; however, per extensive chart review his A1c has been below diabetic range since 2010 and has been off his  diabetic medications since 06/2016.    Dispo: Herbert Marquez is medically stable for discharge. Pending MRI results and on SNF placement. He has been placed at [facility] in the past and prefers to go there if possible.   ,  N, DO 10/05/2017, 11:02 AM Pager: [phone]

## 2017-10-05 NOTE — Plan of Care (Signed)
  Problem: Education: Goal: Knowledge of General Education information will improve Description Including pain rating scale, medication(s)/side effects and non-pharmacologic comfort measures Outcome: Progressing   Problem: Health Behavior/Discharge Planning: Goal: Ability to manage health-related needs will improve Outcome: Progressing   Problem: Nutrition: Goal: Adequate nutrition will be maintained Outcome: Not Progressing Note:  Patient still NPO, awaiting speech consult   Problem: Coping: Goal: Level of anxiety will decrease Outcome: Not Progressing Note:  Patient is extremely frustrated that he cannot find words.

## 2017-10-05 NOTE — Progress Notes (Signed)
SLP Cancellation Note  Patient Details Name: Herbert Marquez MRN: [id number] DOB: May 04, 1927   Cancelled treatment: Attempted to see pt for swallowing evaluation. Cleared with RN.  Pt working with another discipline at time of attempt.  Will re-attempt as SLP Schedule permits.  Time of attempt: [place], [place], [place] 10/05/2017, 9:48 AM

## 2017-10-05 NOTE — Evaluation (Signed)
Physical Therapy Evaluation Patient Details Name: Herbert Marquez MRN: [id number] DOB: Sep 24, 1927 Today's Date: 10/05/2017   History of Present Illness  84 yo male with onset of expressive aphasia and R side weakness with facial droop and new SOB, confusion was admitted.  He is HOH, unable to have MRI initially due to pacemaker.  PMHx:  stroke x 3, OA, basal cell CA, CAD, anterolisthesis C4-5, C5-6 and C6-7, pulm fibrosis, pacemaker x 2 years  Clinical Impression  Pt was assessed for progression to rehab, as he will be home alone and then with spouse who is currently in [place].  He is able to assist all mobility, and weaker on R side but more than previous to his home level.  He was I with transfers, and will work toward his functional improvement acutely to ensure a faster and safer transition home from rehab setting.  When MRI is conducted, if able, may be appropriate for short stay in CIR to recover his mobility.    Follow Up Recommendations SNF    Equipment Recommendations  None recommended by PT    Recommendations for Other Services       Precautions / Restrictions Precautions Precautions: Fall Precaution Comments: cervical spine anterolisthesis multi level Restrictions Weight Bearing Restrictions: No      Mobility  Bed Mobility Overal bed mobility: Needs Assistance Bed Mobility: Supine to Sit     Supine to sit: Min guard;Min assist     General bed mobility comments: helped to support trunk out to side of bed then to scoot EOB  Transfers Overall transfer level: Needs assistance Equipment used: Rolling walker (2 wheeled);1 person hand held assist Transfers: Sit to/from Stand Sit to Stand: Min assist         General transfer comment: pt was able to push off on RUE and used walker for support to stand and took time with PT to get steady  Ambulation/Gait Ambulation/Gait assistance: Min assist Gait Distance (Feet): 5 Feet Assistive device: Rolling walker (2 wheeled);1  person hand held assist Gait Pattern/deviations: Step-to pattern;Shuffle;Wide base of support;Trunk flexed Gait velocity: reduced Gait velocity interpretation: <1.8 ft/sec, indicate of risk for recurrent falls General Gait Details: pt able to accept wgt onto R leg but weak, slow progression to chair  Stairs            Wheelchair Mobility    Modified Rankin (Stroke Patients Only) Modified Rankin (Stroke Patients Only) Pre-Morbid Rankin Score: Moderate disability Modified Rankin: Moderately severe disability     Balance Overall balance assessment: Needs assistance Sitting-balance support: Feet supported;Single extremity supported Sitting balance-Leahy Scale: Fair     Standing balance support: Bilateral upper extremity supported;During functional activity Standing balance-Leahy Scale: Poor                               Pertinent Vitals/Pain Pain Assessment: No/denies pain    Home Living Family/patient expects to be discharged to:: Skilled nursing facility Living Arrangements: Spouse/significant other(wife in hosp at time of evaluation) Available Help at Discharge: Family;Available 24 hours/day Type of Home: House Home Access: Ramped entrance     Home Layout: One level Home Equipment: Walker - 2 wheels;Cane - single point;Wheelchair - Banker;Shower seat;Grab bars - tub/shower      Prior Function Level of Independence: Independent with assistive device(s)         Comments: wc walking, I transfers, wife supervised and occas assisted dressing and bathing  Hand Dominance   Dominant Hand: Right    Extremity/Trunk Assessment   Upper Extremity Assessment Upper Extremity Assessment: Overall WFL for tasks assessed    Lower Extremity Assessment Lower Extremity Assessment: RLE deficits/detail RLE Deficits / Details: R leg tight in hamstrings, but 4- strength generally RLE Coordination: decreased fine motor;decreased gross motor     Cervical / Trunk Assessment Cervical / Trunk Assessment: Kyphotic  Communication   Communication: HOH  Cognition Arousal/Alertness: Awake/alert Behavior During Therapy: WFL for tasks assessed/performed Overall Cognitive Status: Within Functional Limits for tasks assessed                                        General Comments      Exercises     Assessment/Plan    PT Assessment Patient needs continued PT services  PT Problem List Decreased strength;Decreased range of motion;Decreased activity tolerance;Decreased balance;Decreased mobility;Decreased coordination;Decreased safety awareness;Cardiopulmonary status limiting activity       PT Treatment Interventions DME instruction;Gait training;Functional mobility training;Therapeutic activities;Therapeutic exercise;Balance training;Neuromuscular re-education;Patient/family education    PT Goals (Current goals can be found in the Care Plan section)  Acute Rehab PT Goals Patient Stated Goal: to get home and get stronger PT Goal Formulation: With patient/family Time For Goal Achievement: 10/19/17 Potential to Achieve Goals: Good    Frequency Min 2X/week   Barriers to discharge Decreased caregiver support wife is in hospital and not well so would be alone    Co-evaluation               AM-PAC PT "6 Clicks" Daily Activity  Outcome Measure Difficulty turning over in bed (including adjusting bedclothes, sheets and blankets)?: A Little Difficulty moving from lying on back to sitting on the side of the bed? : Unable Difficulty sitting down on and standing up from a chair with arms (e.g., wheelchair, bedside commode, etc,.)?: Unable Help needed moving to and from a bed to chair (including a wheelchair)?: A Little Help needed walking in hospital room?: A Little Help needed climbing 3-5 steps with a railing? : Total 6 Click Score: 12    End of Session Equipment Utilized During Treatment: Gait belt Activity  Tolerance: Patient tolerated treatment well;Patient limited by fatigue Patient left: in chair;with call bell/phone within reach;with chair alarm set;with family/visitor present;with nursing/sitter in room Nurse Communication: Mobility status PT Visit Diagnosis: Unsteadiness on feet (R26.81);Muscle weakness (generalized) (M62.81);Difficulty in walking, not elsewhere classified (R26.2);Hemiplegia and hemiparesis Hemiplegia - Right/Left: Right Hemiplegia - dominant/non-dominant: Dominant Hemiplegia - caused by: Cerebral infarction    Time: 9163-8466 PT Time Calculation (min) (ACUTE ONLY): 31 min   Charges:   PT Evaluation $PT Eval Moderate Complexity: 1 Mod PT Treatments $Therapeutic Activity: 8-22 mins       Ramond Dial 10/05/2017, 10:02 AM   Mee Hives, PT MS Acute Rehab Dept. Number: [place] [facility] [place]

## 2017-10-05 NOTE — Evaluation (Signed)
Occupational Therapy Evaluation Patient Details Name: Herbert Marquez MRN: [id number] DOB: 08-24-1927 Today's Date: 10/05/2017    History of Present Illness 84 yo male with onset of expressive aphasia and R side weakness with facial droop and new SOB, confusion was admitted.  He is HOH, unable to have MRI initially due to pacemaker.  PMHx:  stroke x 3, OA, basal cell CA, CAD, anterolisthesis C4-5, C5-6 and C6-7, pulm fibrosis, pacemaker x 2 years   Clinical Impression   PTA, pt was living with his wife and performed BADLs and functional mobility with RW and supervision from wife for safety. Pt currently requiring Min A for UB ADLs, Mod A for LB ADLs, and Min A for functional transfers with RW. Pt presenting with decreased strength and balance compared to baseline. Pt and family reports that pt's wife (his normal caregiver) is in the hospital. Pt highly motivated to participate in therapy and return to PLOF. Pt would benefit from further acute OT to facilitate safe dc. Recommend dc to SNF for further OT to optimize safety, independence with ADLs, and return to PLOF.      Follow Up Recommendations  SNF;Supervision/Assistance - 24 hour(Family and pt have good experiences with [facility])    Equipment Recommendations  None recommended by OT    Recommendations for Other Services PT consult     Precautions / Restrictions Precautions Precautions: Fall Precaution Comments: cervical spine anterolisthesis multi level Restrictions Weight Bearing Restrictions: No      Mobility Bed Mobility Overal bed mobility: Needs Assistance Bed Mobility: Supine to Sit;Sit to Supine     Supine to sit: Min guard Sit to supine: Min guard   General bed mobility comments: Min Guard A for safety  Transfers Overall transfer level: Needs assistance Equipment used: Rolling walker (2 wheeled);1 person hand held assist Transfers: Sit to/from [facility] Sit to Stand: Min assist Stand pivot  transfers: Min assist       General transfer comment: Min A to power up into standing and then MIn A for balance during pivot EOB<>recliner    Balance Overall balance assessment: Needs assistance Sitting-balance support: Feet supported;Single extremity supported Sitting balance-Leahy Scale: Fair     Standing balance support: Bilateral upper extremity supported;During functional activity Standing balance-Leahy Scale: Poor Standing balance comment: Reliant on UE support and physical A                           ADL either performed or assessed with clinical judgement   ADL Overall ADL's : Needs assistance/impaired Eating/Feeding: Independent;Sitting Eating/Feeding Details (indicate cue type and reason): Using compensatory grasps for holding spoon during self feeding with apple sauce. Pt reports he uses built up handles at baseline. Provided pt with built up handled to use during lunch time. Grooming: Dance movement psychotherapist;Set up;Supervision/safety;Sitting   Upper Body Bathing: Minimal assistance;Sitting   Lower Body Bathing: Moderate assistance;Sit to/from stand   Upper Body Dressing : Minimal assistance;Sitting   Lower Body Dressing: Moderate assistance;Sit to/from stand Lower Body Dressing Details (indicate cue type and reason): Able to adjust socks with increased time and effort. Pt requiring Min A while adjusting socks to prevent posterior lean. Mod A for dyanmic balance during standing. Toilet Transfer: Stand-pivot;Minimal assistance(Simulated to recliner) Armed forces technical officer Details (indicate cue type and reason): Min A for safety and balance during          Functional mobility during ADLs: Rolling walker;Minimal assistance(stand pivot only) General ADL Comments: Pt presenting  with decreased balance and strength compared to baseline.      Vision         Perception     Praxis      Pertinent Vitals/Pain Pain Assessment: Faces Faces Pain Scale: No hurt Pain  Intervention(s): Monitored during session     Hand Dominance Right   Extremity/Trunk Assessment Upper Extremity Assessment Upper Extremity Assessment: Overall WFL for tasks assessed   Lower Extremity Assessment Lower Extremity Assessment: Defer to PT evaluation RLE Deficits / Details: R leg tight in hamstrings, but 4- strength generally RLE Coordination: decreased fine motor;decreased gross motor   Cervical / Trunk Assessment Cervical / Trunk Assessment: Kyphotic   Communication Communication Communication: HOH   Cognition Arousal/Alertness: Awake/alert Behavior During Therapy: WFL for tasks assessed/performed Overall Cognitive Status: Within Functional Limits for tasks assessed                                     General Comments  Daughter there throughout session    Exercises    Shoulder Instructions      Home Living Family/patient expects to be discharged to:: Skilled nursing facility Living Arrangements: Spouse/significant other(wife in hosp at time of evaluation) Available Help at Discharge: Family;Available 24 hours/day Type of Home: House Home Access: Ramped entrance     Home Layout: One level     Bathroom Shower/Tub: Occupational psychologist: Handicapped height Bathroom Accessibility: Yes   Home Equipment: Environmental consultant - 2 wheels;Cane - single point;Wheelchair - Banker;Shower seat;Grab bars - tub/shower   Additional Comments: Has been to [facility] before and enjoys this placement.      Prior Functioning/Environment Level of Independence: Independent with assistive device(s)        Comments: wc walking, I transfers, wife supervised and occas assisted dressing and bathing        OT Problem List: Decreased strength;Decreased range of motion;Decreased activity tolerance;Impaired balance (sitting and/or standing);Decreased knowledge of precautions;Impaired UE functional use;Decreased coordination      OT  Treatment/Interventions: Self-care/ADL training;Therapeutic exercise;Energy conservation;DME and/or AE instruction;Therapeutic activities;Patient/family education    OT Goals(Current goals can be found in the care plan section) Acute Rehab OT Goals Patient Stated Goal: "Go to rehab and get stronger." OT Goal Formulation: With patient Time For Goal Achievement: 10/19/17 Potential to Achieve Goals: Good ADL Goals Pt Will Perform Upper Body Dressing: with set-up;with supervision;sitting Pt Will Perform Lower Body Dressing: with set-up;with supervision;sit to/from stand Pt Will Transfer to Toilet: with set-up;with supervision;ambulating;bedside commode Pt Will Perform Toileting - Clothing Manipulation and hygiene: with set-up;with supervision;sit to/from stand  OT Frequency: Min 2X/week   Barriers to D/C: Other (comment)  Pt's wife is also in hospital       Co-evaluation PT/OT/SLP Co-Evaluation/Treatment: Yes Reason for Co-Treatment: To address functional/ADL transfers(Optimize pt positioning for swallow test)   OT goals addressed during session: ADL's and self-care SLP goals addressed during session: Swallowing    AM-PAC PT "6 Clicks" Daily Activity     Outcome Measure Help from another person eating meals?: A Little Help from another person taking care of personal grooming?: A Little Help from another person toileting, which includes using toliet, bedpan, or urinal?: A Little Help from another person bathing (including washing, rinsing, drying)?: A Lot Help from another person to put on and taking off regular upper body clothing?: A Little Help from another person to put on and taking off regular lower  body clothing?: A Lot 6 Click Score: 16   End of Session Equipment Utilized During Treatment: Gait belt;Rolling walker Nurse Communication: Mobility status  Activity Tolerance: Patient tolerated treatment well Patient left: in bed;with call bell/phone within reach;with  family/visitor present;with SCD's reapplied  OT Visit Diagnosis: Unsteadiness on feet (R26.81);Other abnormalities of gait and mobility (R26.89);Muscle weakness (generalized) (M62.81)                Time: 9758-8325 OT Time Calculation (min): 28 min Charges:  OT General Charges $OT Visit: 1 Visit OT Evaluation $OT Eval Moderate Complexity: Butler, OTR/L Acute Rehab Pager: [phone] Office: [place] 10/05/2017, 3:13 PM

## 2017-10-05 NOTE — H&P (Signed)
Internal Medicine Attending Admission Note Date: 10/05/2017  Patient name: BLYTHE HARTSHORN Medical record number: [id number] Date of birth: 1927-06-24 Age: 84 y.o. Gender: male  I saw and evaluated the patient. I reviewed the resident's note and I agree with the resident's findings and plan as documented in the resident's note.  Chief Complaint(s): Acute expressive aphasia.  History - key components related to admission:  Mr. Landin is an 84 year old man with a history significant for prior cerebrovascular accident with hemorrhagic conversion after tPA, seizure disorder, paroxysmal atrial fibrillation on eliquis, hypertension, hyperlipidemia, and permanent pacemaker placement who presents with the sudden onset of expressive aphasia. He was reportedly in his usual state of health and last seen normal at 11:30 AM on the morning of admission. At approximately 1:30 PM he was noted to have an expressive aphasia with "word salad". He's had similar episodes of expressive aphasia with his previous strokes and seizures. The family states it is difficult to distinguish which of those 2 etiologies causes his symptoms. Given his expressive aphasia, EMS was called and he was transported to the emergency department. A CT scan was done which demonstrated no acute bleed. An MRI was not obtained, presumably because of staffing issues and policies. He was therefore admitted to the internal medicine teaching service for a presumed acute left MCA CVA. His family states he has been compliant with all of his medications including his eliquis.  When seen on rounds the morning following admission his expressive aphasia had resolved. He was able to answer questions consistently and follow commands. He felt back to his baseline.  Physical Exam - key components related to admission:  Vitals:   10/05/17 0020 10/05/17 0220 10/05/17 0347 10/05/17 0803  BP: (!) 107/91 131/76  122/80  Pulse: 65 60 62 60  Resp: 18 18 (!) 22 20   Temp: 98.5 F (36.9 C) 98.4 F (36.9 C) 97.9 F (36.6 C) 98.2 F (36.8 C)  TempSrc: Oral Oral Oral Oral  SpO2: 100% 95%  99%  Weight:   164 lb 10.9 oz (74.7 kg)   Height:       Gen.: Well-developed, well-nourished, man lying comfortably in bed in no acute distress. He was pleasant and cooperative with the examination. There was no appreciable dysarthria. Neuro: Alert and oriented. Speech was clear without dysarthria. There was a mild right facial droop. Station grossly intact throughout to light touch. Strength 5/5 in the left upper and lower extremities. Strength 5/5 in the right upper extremity and 4/5 in the right lower extremity. Heart: Regular rate and rhythm  Lab results:  Basic Metabolic Panel: Recent Labs    10/04/17 1335 10/04/17 1340 10/05/17 0516  NA 134* 133* 134*  K 3.6 3.6 4.2  CL 101 99 103  CO2 19*  --  21*  GLUCOSE 105* 100* 91  BUN 13 15 12   CREATININE 1.14 1.00 1.10  CALCIUM 9.5  --  8.8*   Liver Function Tests: Recent Labs    10/04/17 1335 10/05/17 0516  AST 42* 37  ALT 20 18  ALKPHOS 68 58  BILITOT 0.8 1.0  PROT 6.7 5.6*  ALBUMIN 3.9 3.3*   CBC: Recent Labs    10/04/17 1335 10/04/17 1340 10/05/17 0516  WBC 13.2*  --  9.3  NEUTROABS 9.1*  --   --   HGB 14.4 15.0 13.1  HCT 44.0 44.0 39.2  MCV 93.6  --  92.7  PLT 340  --  289   Hemoglobin A1C: Recent Labs  10/04/17 1335  HGBA1C 5.5   Fasting Lipid Panel: Recent Labs    10/05/17 0516  CHOL 94  HDL 31*  LDLCALC 52  TRIG 53  CHOLHDL 3.0   Thyroid Function Tests: Recent Labs    10/04/17 1852  TSH 3.066   Coagulation: Recent Labs    10/04/17 1335  INR 1.49   Urine Drug Screen:  Negative for opiates, cocaine, benzodiazepines, amphetamines, THC, barbiturates.  Alcohol Level: Recent Labs    10/04/17 1335  ETH <10   Urinalysis:  Clear, yellow, specific gravity 1.021, pH 8.0, ketones 5, nitrite negative, leukocytes negative.  Imaging results:   PA and  lateral chest x-ray: Personally reviewed. Peripheral basilar scarring, pacemaker in place, calcifications of the aortic, unchanged from the previous chest x-ray on 01/07/2017.  CT scan of the head without contrast: Personally reviewed. No acute bleed.  CTA of the head: Please see radiologist's report was reviewed.  Other results:  EKG: Personally reviewed. Normal sinus rhythm at 73 bpm, indeterminate axis, first-degree AV block, left anterior hemiblock, right bundle branch block, no significant Q waves, no LVH by voltage, no significant ST or T wave changes. The right bundle branch block is new compared to the prior ECG from 04/18/2017.  Assessment & Plan by Problem:  Mr. Dorner is an 84 year old man with a history significant for prior cerebrovascular accident with hemorrhagic conversion after tPA, seizure disorder, paroxysmal atrial fibrillation on eliquis, hypertension, hyperlipidemia, and permanent pacemaker placement who presents with the sudden onset of expressive aphasia.  A CT scan without contrast revealed no acute bleed. An MRI has yet to be obtained secondary to staffing issues and policies. This study is pending as the working diagnosis right now is a left MCA CVA. Fortunately, his expressive aphasia has resolved and I suspect the residual neurologic deficits are old from his prior CVAs.  1) Presumed left MCA CVA with expressive aphasia: The expressive aphasia has resolved. An MRI will be obtained today as his pacemaker is MRI compatible. We are completing the stroke evaluation per protocol. We are awaiting speech therapy evaluation. Physical therapy is suggesting skilled nursing facility placement. We will make sure this is an option the patient is willing to consider as he was quite clear this morning he was interested in going home and being with his wife who was also presumably going to be discharged from the hospital today. We will continue the cardiovascular risk factor modifications  as well as the eliquis for the paroxysmal atrial fibrillation.  2) Disposition: We will clarify with him his wishes and mention that the physical therapist felt that he would benefit from a skilled nursing facility visit prior to returning home. Once this is sorted out we will have a direction in which to go. If he wishes to return home and refuses skilled nursing facility placement he will likely be discharged today once we complete the stroke workup.

## 2017-10-05 NOTE — Progress Notes (Signed)
STROKE TEAM PROGRESS NOTE   HISTORY OF PRESENT ILLNESS (per record) Herbert Marquez is a 84 y.o. male with a PMH of PAF (on Eliquis), HLD, CAD, HTN, CVA x-06/2015, SDH, seizure (Keppra), DM with insulin use presents to the ER with c/o difficulty speaking that was noticed by his wife this morning prompting her to call EMS. Code stroke page initiated at 1321 with neurologist and neuro NP's at bedside. Glucose in field reported as 100. Patient's last dose of Eliquis is unknown at this time. SBP 150/84 Patient's wife was unable to be contact by phone and apparently being admitted under cardiac care. D/t patient's speech history was unable to be obtain so chart review was utilized. At baseline patient patient utilizes wheelchair for ambulation with residual right hemiparesis per previous neurology note 04/23/17 Dr. Erlinda Hong. Patient is not a candidate for tpa with blood thinner use, and hx of SDH with risk for hemorrhage. CTA ordered to r/o need for endovascular intervention     LKW: 1130 tpa given?: No, contraindicated due to history of intracranial hemorrhage Premorbid modified Rankin scale (mRS): 2     SUBJECTIVE (INTERVAL HISTORY) His son is at bedside and his speech is improved    OBJECTIVE Temp:  [97.5 F (36.4 C)-98.5 F (36.9 C)] 98.2 F (36.8 C) (08/03 0803) Pulse Rate:  [60-99] 60 (08/03 0803) Cardiac Rhythm: Normal sinus rhythm;Bundle branch block;Heart block;Atrial paced (08/03 0800) Resp:  [16-24] 20 (08/03 0803) BP: (96-150)/(53-91) 122/80 (08/03 0803) SpO2:  [95 %-100 %] 99 % (08/03 0803) Weight:  [161 lb 9.6 oz (73.3 kg)-169 lb 8.5 oz (76.9 kg)] 164 lb 10.9 oz (74.7 kg) (08/03 0347)  CBC:  Recent Labs  Lab 10/04/17 1335 10/04/17 1340 10/05/17 0516  WBC 13.2*  --  9.3  NEUTROABS 9.1*  --   --   HGB 14.4 15.0 13.1  HCT 44.0 44.0 39.2  MCV 93.6  --  92.7  PLT 340  --  503    Basic Metabolic Panel:  Recent Labs  Lab 10/04/17 1335 10/04/17 1340 10/05/17 0516  NA 134*  133* 134*  K 3.6 3.6 4.2  CL 101 99 103  CO2 19*  --  21*  GLUCOSE 105* 100* 91  BUN 13 15 12   CREATININE 1.14 1.00 1.10  CALCIUM 9.5  --  8.8*    Lipid Panel:     Component Value Date/Time   CHOL 94 10/05/2017 0516   CHOL 110 04/02/2017 0859   TRIG 53 10/05/2017 0516   HDL 31 (L) 10/05/2017 0516   HDL 37 (L) 04/02/2017 0859   CHOLHDL 3.0 10/05/2017 0516   VLDL 11 10/05/2017 0516   LDLCALC 52 10/05/2017 0516   LDLCALC 57 04/02/2017 0859   HgbA1c:  Lab Results  Component Value Date   HGBA1C 5.5 10/04/2017   Urine Drug Screen:     Component Value Date/Time   LABOPIA NONE DETECTED 10/04/2017 1344   COCAINSCRNUR NONE DETECTED 10/04/2017 1344   LABBENZ NONE DETECTED 10/04/2017 1344   AMPHETMU NONE DETECTED 10/04/2017 1344   THCU NONE DETECTED 10/04/2017 1344   LABBARB NONE DETECTED 10/04/2017 1344    Alcohol Level     Component Value Date/Time   ETH <10 10/04/2017 1335    IMAGING  Ct Angio Head W Or Wo Contrast Ct Angio Neck W Or Wo Contrast Ct Cerebral Perfusion W Contrast 10/04/2017 IMPRESSION:  1. CT perfusion suggests delayed perfusion in the right posterior cerebellum without core infarct.  The distal right vertebral  artery is chronically occluded in there is collateral flow to the right PICA which may contribute to delayed perfusion of this area. MRI would be necessary to determine if acute infarct is present.  2. Atherosclerotic calcification of the carotid bifurcation bilaterally and in the cavernous carotid bilaterally without flow limiting stenosis.  3. Negative for emergent large vessel intracranial occlusion.     X-ray Chest Pa And Lateral 10/04/2017 IMPRESSION:  Stable interstitial fibrosis.  No acute abnormality.     Ct Head Code Stroke Wo Contrast 10/04/2017 IMPRESSION:  1. No acute intracranial abnormality or significant interval change.  2. Remote lacunar infarct of the left thalamus.  3. Stable chronic advanced atrophy and white matter  disease. This likely reflects the sequela of chronic microvascular ischemia.  4. ASPECTS is 10/10       Transthoracic Echocardiogram - pending 00/00/00     PHYSICAL EXAM Vitals:   10/05/17 0020 10/05/17 0220 10/05/17 0347 10/05/17 0803  BP: (!) 107/91 131/76  122/80  Pulse: 65 60 62 60  Resp: 18 18 (!) 22 20  Temp: 98.5 F (36.9 C) 98.4 F (36.9 C) 97.9 F (36.6 C) 98.2 F (36.8 C)  TempSrc: Oral Oral Oral Oral  SpO2: 100% 95%  99%  Weight:   164 lb 10.9 oz (74.7 kg)   Height:         PHYSICAL EXAM Physical exam: Exam: Gen: NAD Eyes: anicteric sclerae, moist conjunctivae                    CV: no MRG, no carotid bruits, no peripheral edema Mental Status: Alert, follows commands, good historian  Neuro: Detailed Neurologic Exam  Speech:    Improved aphasia/dysphasia able to follow commands  Cranial Nerves:    The pupils are equal, round, and reactive to light.. Attempted, Fundi not visualized.  EOMI. No gaze preference. Visual fields full. Mild right facial droop, Tongue midline. Hearing intact to voice. Shoulder shrug intact  Motor Observation:    no involuntary movements noted. Tone appears normal.     Strength:    Mild right hemiparesis 4/5  (chronic)     Sensation:  Intact to LT      ASSESSMENT/PLAN Herbert Marquez is a 84 y.o. male with history of PAF (on Eliquis), HLD, CAD, HTN, CVA x-06/2015, SDH, seizure (Keppra), DM with insulin presenting with speech difficulties. He did not receive IV t-PA due to Eliquis and SDH hx.  Suspected Stroke:  Await further imaging. May need repeat CT due to PPM.  Resultant improvement of aphasia/dysphasia  CT head - No acute intracranial abnormality. Remote lacunar infarct of the left thalamus.   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   MRI head - MRI ordered (PPM)  MRA head - not  performed  Carotid Doppler - CTA neck performed or pending - carotid dopplers not indicated.  2D Echo - pending  LDL - 52  HgbA1c - 5.5  VTE prophylaxis - Eliquis Diet Order           Diet NPO time specified  Diet effective now          aspirin 81 mg daily and Eliquis (apixaban) daily prior to admission, now on aspirin 81 mg daily and Eliquis (apixaban) daily  Patient counseled to be compliant with his antithrombotic medications  Ongoing aggressive stroke risk factor management  Therapy recommendations:  pending  Disposition:  Pending  Hypertension .  Permissive hypertension (OK if < 220/120) but gradually normalize in 5-7 days .  Long-term BP goal normotensive  Hyperlipidemia  Lipid lowering medication PTA: Lipitor 80 mg daily  LDL 52, goal < 70  Current lipid lowering medication:  Lipitor 80 mg daily  Continue statin at discharge   Other Stroke Risk Factors  Advanced age  Former cigarette smoker - quit  ETOH use, advised to drink no more than 1 alcoholic beverage per day.  Hx stroke/TIA  Coronary artery disease  PAF - on Eliquis  Other Active Problems  PPM   Seizure Hx  Na 134     Hospital day # 0  Personally examined patient and images, and have participated in and made any corrections needed to history, physical, neuro exam,assessment and plan as stated above.  I have personally obtained the history, evaluated lab date, reviewed imaging studies and agree with radiology interpretations.    Sarina Ill, MD Stroke Neurology       To contact Stroke Continuity provider, please refer to [email or web address] After hours, contact General Neurology

## 2017-10-05 NOTE — Progress Notes (Signed)
Internal Medicine Attending  Date: 10/05/2017  Patient name: Herbert Marquez Medical record number: [id number] Date of birth: Jul 26, 1927 Age: 84 y.o. Gender: male  I saw and evaluated the patient. I reviewed the resident's note by Dr. Laural Golden and I agree with the resident's findings and plans as documented in her progress note.  Please see my H&P dated 10/05/2017 for the specifics of my evaluation, assessment, and plan from earlier in the day.

## 2017-10-05 NOTE — Evaluation (Signed)
Clinical/Bedside Swallow Evaluation Patient Details  Name: Herbert Marquez MRN: [id number] Date of Birth: [birth year]  Today's Date: 10/05/2017 Time: SLP Start Time (ACUTE ONLY): 1119 SLP Stop Time (ACUTE ONLY): 1134 SLP Time Calculation (min) (ACUTE ONLY): 15 min  Past Medical History:  Past Medical History:  Diagnosis Date  . Arthritis    "pretty much all over"   . Basal cell carcinoma    "several burned off his body, face, head"  . BPH (benign prostatic hypertrophy)   . Coronary artery disease    a. s/p PCI of RCA in 2006  . CVA (cerebral infarction)    a. 06/2015: left thalamic and bilateral PCA  . GERD (gastroesophageal reflux disease)   . Hyperlipidemia   . Hypertension   . Presence of permanent cardiac pacemaker   . Seizures (Herbert Marquez)   . Stroke (Herbert Marquez)   . TIA (transient ischemic attack)    Approximately 6 weeks post-cardiac catheterization.    Past Surgical History:  Past Surgical History:  Procedure Laterality Date  . CARDIOVASCULAR STRESS TEST  07/01/2007   EF 74%  . CATARACT EXTRACTION, BILATERAL    . CORONARY ANGIOPLASTY WITH STENT PLACEMENT  10/2004   stenting x 2 to RCA  . FEMUR IM NAIL Right 11/26/2015   Procedure: INTRAMEDULLARY RIGHT (IM) NAIL FEMORAL;  Surgeon: Rod Can, MD;  Location: [facility];  Service: Orthopedics;  Laterality: Right;  . FRACTURE SURGERY    . HERNIA REPAIR    . HIP ARTHROPLASTY  03/09/2011   Procedure: ARTHROPLASTY BIPOLAR HIP;  Surgeon: Mauri Pole;  Location: [facility];  Service: Orthopedics;  Laterality: Left;  . INSERT / REPLACE / REMOVE PACEMAKER  10/23/2016  . LAPAROSCOPIC INCISIONAL / UMBILICAL / VENTRAL HERNIA REPAIR     "below his naval"  . PACEMAKER IMPLANT N/A 10/23/2016   Procedure: Pacemaker Implant;  Surgeon: Thompson Grayer, MD;  Location: [facility];  Service: Cardiovascular;  Laterality: N/A;   HPI:  Herbert Marquez is an 84 year old man with a medical history significant of CVA, seizure disorder, PAF on eliquis, DM,  HTN, hyperlipidemia, and presence of permanent cardiac pacemaker who presents with stroke like symptoms of expressive aphasia, confusion and SOB. On presentation he was unable to answer questions or follow commands with severe aphasia, had right facial droop and right arm and leg weakness at baseline.  Pt was not a candidate for tPA  Head CT 8/2 revealed no acute intracranial abnormalities.  CXR 8/2 showed no acute findings.    Assessment / Plan / Recommendation Clinical Impression  Pt presents with functional swallowing as assessed clinically.  Pt tolerated all consistencies trialed with no clinical s/s of aspiration and exhibited good oral clearance of regular solids after slightly prolonged oral phase. Pt has hx of CVA x3 with right sided deficits.  There is a possible slight R facial droop, which is likely related at least in part to previous strokes.   Recommend regular texture diet with thin liquid.    Pt and family report pt has returned to baseline.  Pt was able to participate in conversation without difficulty.  Speech is clear and free from dysarthria.  Although pt does endorse some cognitive linguistic impairment and word finding difficulties, he does not endorse lingering acute changes and feels that he is back to normal.  Daughter notes dramatic improvement from time of admission and feels he is at least at 95%.  If there is concern for ongoing speech-languge or cognitive-linguistic deficits, please  place orders for SLE.  SLP Visit Diagnosis: Dysphagia, oropharyngeal phase (R13.12)    Aspiration Risk  No limitations    Diet Recommendation Regular;Thin liquid   Liquid Administration via: Cup;Straw Medication Administration: Whole meds with liquid Supervision: Patient able to self feed Compensations: Minimize environmental distractions;Slow rate;Small sips/bites Postural Changes: Seated upright at 90 degrees    Other  Recommendations Oral Care Recommendations: Oral care BID   Follow  up Recommendations None        Swallow Study   General Date of Onset: 10/04/17 HPI: Herbert Marquez is an 84 year old man with a medical history significant of CVA, seizure disorder, PAF on eliquis, DM, HTN, hyperlipidemia, and presence of permanent cardiac pacemaker who presents with stroke like symptoms of expressive aphasia, confusion and SOB. On presentation he was unable to answer questions or follow commands with severe aphasia, had right facial droop and right arm and leg weakness at baseline.  Head CT 8/2 revealed no acute intracranial abnormalities.  CXR 8/2 showed no acute findings.  Type of Study: Bedside Swallow Evaluation Previous Swallow Assessment: Pt known to this service from previous admissions. Discharged from [facility] April 2018 on regular diet. Diet Prior to this Study: NPO Temperature Spikes Noted: No History of Recent Intubation: No Behavior/Cognition: Alert;Cooperative;Pleasant mood Oral Cavity Assessment: Within Functional Limits Oral Care Completed by SLP: No Oral Cavity - Dentition: Missing dentition Self-Feeding Abilities: Able to feed self Patient Positioning: Upright in chair Baseline Vocal Quality: Normal Volitional Cough: Strong Volitional Swallow: Able to elicit    Oral/Motor/Sensory Function Overall Oral Motor/Sensory Function: Within functional limits   Ice Chips     Thin Liquid Thin Liquid: Within functional limits Presentation: Cup;Straw    Nectar Thick Nectar Thick Liquid: Not tested   Honey Thick Honey Thick Liquid: Not tested   Puree Puree: Within functional limits Presentation: Self Fed;Spoon   Solid     Solid: Within functional limits Presentation: [place], MA, CCC-SLP 10/05/2017,12:18 PM

## 2017-10-06 ENCOUNTER — Observation Stay (HOSPITAL_BASED_OUTPATIENT_CLINIC_OR_DEPARTMENT_OTHER): Payer: Medicare Other

## 2017-10-06 DIAGNOSIS — I4891 Unspecified atrial fibrillation: Secondary | ICD-10-CM

## 2017-10-06 DIAGNOSIS — R4701 Aphasia: Secondary | ICD-10-CM | POA: Diagnosis not present

## 2017-10-06 DIAGNOSIS — G459 Transient cerebral ischemic attack, unspecified: Secondary | ICD-10-CM | POA: Diagnosis not present

## 2017-10-06 LAB — URINALYSIS, ROUTINE W REFLEX MICROSCOPIC
Bilirubin Urine: NEGATIVE
Glucose, UA: NEGATIVE mg/dL
Hgb urine dipstick: NEGATIVE
Ketones, ur: NEGATIVE mg/dL
Leukocytes, UA: NEGATIVE
Nitrite: NEGATIVE
Protein, ur: NEGATIVE mg/dL
Specific Gravity, Urine: 1.015 (ref 1.005–1.030)
pH: 6 (ref 5.0–8.0)

## 2017-10-06 LAB — ECHOCARDIOGRAM COMPLETE
Height: 67 in
Weight: 2634.94 oz

## 2017-10-06 NOTE — Progress Notes (Signed)
Daughter, Lattie Haw called and made aware of patient new on set agitation. Daughter states condom catheter give her dad UTI and ask that I request a new UA from the MD. Luther Parody she will come sit with patient.

## 2017-10-06 NOTE — Progress Notes (Addendum)
After obtaining MD approval for patient to the leave the unit to see wife on [address], patient was transported via Development worker, community via wheelchair. Report given on patient to SWOT RN.  Currently awaiting patient safe return back to [address] [place].

## 2017-10-06 NOTE — Care Management (Signed)
Discussed OBS status with Shelton Silvas, CSW and d/w patient's daughter, [name].  [name] and family are interested in assessing cost of privately paying for [facility].  Pt may be a candidate for [facility] with [facility].  [name] states they would be interested in considering [place] with [facility] contacted and will assess patient for eligibility.  Planning for d/c tomorrow.

## 2017-10-06 NOTE — Clinical Social Work Note (Signed)
CSW spoke with pt's daughter regarding observation status in relation to SNF coverage. CSW explained that because pt is obs Medicare will not cover SNF at d/c. The options were explained; Pt's family can private pay or pt can d/c home with [facility]. Pt's daughter is going to discuss the options with pt's spouse. In the meantime, pt's daughter ask that CSW determine up front cost for [facility]. CSW to follow up with pt's daughter once determined.  Loletha Grayer, MSW [phone]

## 2017-10-06 NOTE — Clinical Social Work Note (Signed)
Clinical Social Work Assessment  Patient Details  Name: Herbert Marquez MRN: [id number] Date of Birth: Jul 18, 1927  Date of referral:  10/06/17               Reason for consult:  Facility Placement, Discharge Planning                Permission sought to share information with:  Family Supports Permission granted to share information::  Yes, Release of Information Signed  Name::     Demetrius Mahler   Agency::  family  Relationship::   son  Contact Information:  Shyam Dawson [phone]  Housing/Transportation Living arrangements for the past 2 months:  Single Family Home(with wife. ) Source of Information:  Siblings(son) Patient Interpreter Needed:  None Criminal Activity/Legal Involvement Pertinent to Current Situation/Hospitalization:  No - Comment as needed Significant Relationships:  Adult Children Lives with:  Spouse Do you feel safe going back to the place where you live?  Yes Need for family participation in patient care:  Yes (Comment)  Care giving concerns:  CSW consulted for discharge planning needs at the time of discharge.    Social Worker assessment / plan:  CSW spoke with pt and son at bedside. CSW was informed that pt has been confused and "had a rough night". CSW received permission from pt to speak with son. CSW spoke with son and was informed that pt is from home with spouse. Son reports that both pt and spouse are in the hospital at this time. CSW was informed that pt's and family's top choice is [facility]. Lennette Bihari expressed that facility place bed rails up for pt's safety.   Employment status:  Retired Forensic scientist:  Medicare PT Recommendations:  [place] / Referral to community resources:  [facility](interested in [facility])  Patient/Family's Response to care:  Pt's son response to care is understanding and agreeable to plan of care at this time.  Patient/Family's Understanding of and Emotional Response to  Diagnosis, Current Treatment, and Prognosis:  Only request made by pt's son at this time is that facility have bed rails for pt. NO other questions or concerns have been presented to CSW at this time.   Emotional Assessment Appearance:  Appears older than stated age Attitude/Demeanor/Rapport:  Other Affect (typically observed):  Appropriate, Pleasant, Quiet, Calm Orientation:  (pt is confused. ) Alcohol / Substance use:  Not Applicable Psych involvement (Current and /or in the community):  No (Comment)  Discharge Needs  Concerns to be addressed:  Other (Comment Required(pt beign confused and needing bed raiils at the facility. ) Readmission within the last 30 days:  No Current discharge risk:  None Barriers to Discharge:  No Barriers Identified   Wetzel Bjornstad, [place] 10/06/2017, 8:58 AM

## 2017-10-06 NOTE — Care Management Obs Status (Signed)
[place] NOTIFICATION   Patient Details  Name: Herbert Marquez MRN: [id number] Date of Birth: 1927/11/24   Medicare Observation Status Notification Given:  Yes    Claudie Leach, RN 10/06/2017, 3:25 PM

## 2017-10-06 NOTE — Plan of Care (Signed)
Patient extremely agitated this morning.   Problem: Education: Goal: Knowledge of General Education information will improve Description Including pain rating scale, medication(s)/side effects and non-pharmacologic comfort measures Outcome: Not Progressing   Problem: Health Behavior/Discharge Planning: Goal: Ability to manage health-related needs will improve Outcome: Not Progressing   Problem: Elimination: Goal: Will not experience complications related to bowel motility Outcome: Not Progressing   Problem: Education: Goal: Knowledge of disease or condition will improve Outcome: Not Progressing Goal: Knowledge of secondary prevention will improve Outcome: Not Progressing

## 2017-10-06 NOTE — Progress Notes (Signed)
STROKE TEAM PROGRESS NOTE   HISTORY OF PRESENT ILLNESS (per record) Herbert Marquez is a 84 y.o. male with a PMH of PAF (on Eliquis), HLD, CAD, HTN, CVA x-06/2015, SDH, seizure (Keppra), DM with insulin use presents to the ER with c/o difficulty speaking that was noticed by his wife this morning prompting her to call EMS. Code stroke page initiated at 1321 with neurologist and neuro NP's at bedside. Glucose in field reported as 100. Patient's last dose of Eliquis is unknown at this time. SBP 150/84 Patient's wife was unable to be contact by phone and apparently being admitted under cardiac care. D/t patient's speech history was unable to be obtain so chart review was utilized. At baseline patient patient utilizes wheelchair for ambulation with residual right hemiparesis per previous neurology note 04/23/17 Dr. Erlinda Hong. Patient is not a candidate for tpa with blood thinner use, and hx of SDH with risk for hemorrhage. CTA ordered to r/o need for endovascular intervention     LKW: 1130 tpa given?: No, contraindicated due to history of intracranial hemorrhage Premorbid modified Rankin scale (mRS): 2     SUBJECTIVE (INTERVAL HISTORY) His son is at bedside and his speech is improved, also wife here today. Discussed there as no stroke on MRI.    OBJECTIVE Temp:  [97.5 F (36.4 C)-98.8 F (37.1 C)] 97.5 F (36.4 C) (08/04 0740) Pulse Rate:  [60-80] 60 (08/04 0740) Cardiac Rhythm: Normal sinus rhythm;Bundle branch block;Heart block;Atrial paced (08/03 2000) Resp:  [17-20] 18 (08/04 0740) BP: (104-124)/(58-80) 114/58 (08/04 0740) SpO2:  [94 %-100 %] 100 % (08/04 0740)  CBC:  Recent Labs  Lab 10/04/17 1335 10/04/17 1340 10/05/17 0516  WBC 13.2*  --  9.3  NEUTROABS 9.1*  --   --   HGB 14.4 15.0 13.1  HCT 44.0 44.0 39.2  MCV 93.6  --  92.7  PLT 340  --  742    Basic Metabolic Panel:  Recent Labs  Lab 10/04/17 1335 10/04/17 1340 10/05/17 0516  NA 134* 133* 134*  K 3.6 3.6 4.2  CL 101 99  103  CO2 19*  --  21*  GLUCOSE 105* 100* 91  BUN 13 15 12   CREATININE 1.14 1.00 1.10  CALCIUM 9.5  --  8.8*    Lipid Panel:     Component Value Date/Time   CHOL 94 10/05/2017 0516   CHOL 110 04/02/2017 0859   TRIG 53 10/05/2017 0516   HDL 31 (L) 10/05/2017 0516   HDL 37 (L) 04/02/2017 0859   CHOLHDL 3.0 10/05/2017 0516   VLDL 11 10/05/2017 0516   LDLCALC 52 10/05/2017 0516   LDLCALC 57 04/02/2017 0859   HgbA1c:  Lab Results  Component Value Date   HGBA1C 5.5 10/04/2017   Urine Drug Screen:     Component Value Date/Time   LABOPIA NONE DETECTED 10/04/2017 1344   COCAINSCRNUR NONE DETECTED 10/04/2017 1344   LABBENZ NONE DETECTED 10/04/2017 1344   AMPHETMU NONE DETECTED 10/04/2017 1344   THCU NONE DETECTED 10/04/2017 1344   LABBARB NONE DETECTED 10/04/2017 1344    Alcohol Level     Component Value Date/Time   ETH <10 10/04/2017 1335    IMAGING  Ct Angio Head W Or Wo Contrast Ct Angio Neck W Or Wo Contrast Ct Cerebral Perfusion W Contrast 10/04/2017 IMPRESSION:  1. CT perfusion suggests delayed perfusion in the right posterior cerebellum without core infarct.  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 MRI would be necessary to determine if acute infarct is present.  2. Atherosclerotic calcification of the carotid bifurcation bilaterally and in the cavernous carotid bilaterally without flow limiting stenosis.  3. Negative for emergent large vessel intracranial occlusion.     X-ray Chest Pa And Lateral 10/04/2017 IMPRESSION:  Stable interstitial fibrosis.  No acute abnormality.     Ct Head Code Stroke Wo Contrast 10/04/2017 IMPRESSION:  1. No acute intracranial abnormality or significant interval change.  2. Remote lacunar infarct of the left thalamus.  3. Stable chronic advanced atrophy and white matter disease. This likely reflects the sequela  of chronic microvascular ischemia.  4. ASPECTS is 10/10   MRI brain 10/05/2017: IMPRESSION: 1. No acute intracranial abnormality. 2. Moderate chronic small vessel ischemic disease and cerebral atrophy.    Transthoracic Echocardiogram -  - Procedure narrative: Transthoracic echocardiography. Image   quality was suboptimal. - Left ventricle: The cavity size was normal. Wall thickness was   normal. Systolic function was normal. The estimated ejection   fraction was 55%. Wall motion was normal; there were no regional   wall motion abnormalities. Doppler parameters are consistent with   abnormal left ventricular relaxation (grade 1 diastolic   dysfunction). - Ventricular septum: Septal motion showed abnormal function and   dyssynergy. These changes are consistent with right ventricular   pacing. - Left atrium: The atrium was mildly dilated. - Right ventricle: Pacer wire or catheter noted in right ventricle.   Systolic function was mildly reduced     PHYSICAL EXAM Vitals:   10/05/17 2009 10/06/17 0014 10/06/17 0450 10/06/17 0740  BP: 112/69 124/66 104/68 (!) 114/58  Pulse: 66 60 80 60  Resp: 17 18 18 18   Temp: 98.6 F (37 C) 98.1 F (36.7 C) (!) 97.5 F (36.4 C) (!) 97.5 F (36.4 C)  TempSrc: Oral Oral Oral Oral  SpO2: 94% 96% 97% 100%  Weight:      Height:         PHYSICAL EXAM Physical exam: Exam: Gen: NAD Eyes: anicteric sclerae, moist conjunctivae                    CV: no MRG, no carotid bruits, no peripheral edema Mental Status: Alert, follows commands, good historian  Neuro: Detailed Neurologic Exam  Speech:    Improved aphasia/dysphasia able to follow commands  Cranial Nerves:    The pupils are equal, round, and reactive to light.. Attempted, Fundi not visualized.  EOMI. No gaze preference. Visual fields full. Mild right facial droop, Tongue midline. Hearing intact to voice. Shoulder shrug intact  Motor Observation:    no involuntary movements noted.  Tone appears normal.     Strength:    Mild right hemiparesis 4/5  (chronic)     Sensation:  Intact to LT      ASSESSMENT/PLAN [name] is a 84 y.o. male with history of PAF (on Eliquis), HLD, CAD, HTN, CVA x-06/2015, SDH, seizure (Keppra), DM with insulin presenting with speech difficulties. He did not receive IV t-PA due to Eliquis and SDH hx.  TIA - NO STROKE on MRI  Resultant improvement of aphasia/dysphasia  CT head - No acute intracranial abnormality. Remote lacunar infarct of the left thalamus.   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   MRI head -  NO STROKE  MRA head - not performed  Carotid Doppler - CTA neck performed or pending - carotid dopplers not indicated.  2D Echo - normal EF, no thrombus   LDL - 52  HgbA1c - 5.5  VTE prophylaxis - Eliquis Diet Order           Diet Heart Room service appropriate? Yes; Fluid consistency: Thin  Diet effective now          aspirin 81 mg daily and Eliquis (apixaban) daily prior to admission, now on aspirin 81 mg daily and Eliquis (apixaban) daily  Patient counseled to be compliant with his antithrombotic medications  Ongoing aggressive stroke risk factor management  Therapy recommendations:  pending  Disposition:  Pending  Hypertension .  Permissive hypertension (OK if < 220/120) but gradually normalize in 5-7 days .  Long-term BP goal normotensive  Hyperlipidemia  Lipid lowering medication PTA: Lipitor 80 mg daily  LDL 52, goal < 70  Current lipid lowering medication:  Lipitor 80 mg daily  Continue statin at discharge   Other Stroke Risk Factors  Advanced age  Former cigarette smoker - quit  ETOH use, advised to drink no more than 1 alcoholic beverage per day.  Hx stroke/TIA  Coronary artery disease  PAF - on Eliquis  Other Active Problems  PPM   Seizure  Hx  Na 134  Plan:  Continue ASA ane Eliquis and statin Stroke team will sign off at this time     Hospital day # 0  Personally examined patient and images, and have participated in and made any corrections needed to history, physical, neuro exam,assessment and plan as stated above.  I have personally obtained the history, evaluated lab date, reviewed imaging studies and agree with radiology interpretations.    Sarina Ill, MD Stroke Neurology       To contact Stroke Continuity provider, please refer to [email or web address] After hours, contact General Neurology

## 2017-10-06 NOTE — Progress Notes (Addendum)
   Subjective: Herbert Marquez had confusion overnight and was quite agitated this morning. Family requests repeat UA as they note prior UTI with use of condom cath. No other acute events. Wife still remains hospitalized with [facility].   Objective:  Vital signs in last 24 hours: Vitals:   10/05/17 2009 10/06/17 0014 10/06/17 0450 10/06/17 0740  BP: 112/69 124/66 104/68 (!) 114/58  Pulse: 66 60 80 60  Resp: 17 18 18 18   Temp: 98.6 F (37 C) 98.1 F (36.7 C) (!) 97.5 F (36.4 C) (!) 97.5 F (36.4 C)  TempSrc: Oral Oral Oral Oral  SpO2: 94% 96% 97% 100%  Weight:      Height:       Physical Exam: General: Elderly caucasian male resting in bed. NAD. Restless.  Head: Herbert Marquez, AT. No facial droop Heart: RRR. No murmur Lungs: Grossly clear without increased WOB.  Extr: Warm, perfused.   Assessment/Plan: 84 year-old male with multiple prior CVA's, history of seizure disorder, PAF on eliquis, permanent cardiac pacemaker and reported history of HTN and DM2 admitted 8/1 with expressive aphasia, confusion and SOB. Presentation similar to prior CVAs, TIAs and/or seizures. CT head and subsequent MRI negative for acute process but demonstration of several old infarctions as well as encephalomalacia was noted. Neurology is on. PT recommends SNF and we are currently awaiting placement.   Principal Problem:   Expressive aphasia Active Problems:   Cerebral thrombosis with cerebral infarction  Principal Problem:   Expressive aphasia  Expressive aphasia, TIA History of multiple CVA with residual R-sided weakness History of seizure disorder MRI yesterday without evidence for acute ischemia but did demonstrate small chronic cortical infarcts in right occipital and parietal lobes. Chronic encephalomalacia in anterior left temporal lobe and chronic thalamic lacunar infarction also noted.  -PT recommends SNF; currently awaiting placement. Pt prefers [facility] -Neurology on, appreciate assistance; have ordered  repeat ECHO which is scheduled for today -Continue atorvastatin, apixaban, and ASA per neurology recommendations.  -Continue Keppra  Agitation Suspect patients agitation overnight related to delirium in this elderly patient who is currently hospitalized. In addition, his wife remains admitted at [facility] and pt has been unable to see her-this could also be contributing to agitation and/or delirium. Family reports that he's developed a UTI from a condom cath in the past and requests that we recheck his urine today.  -UA ordered, pending; He is afebrile   Reported History of Hypertension: He remains normotensive. He is not on any antihypertensives at home.   Reported history of DM: Patient has a previous diagnosis of DM; however, per extensive chart review his A1c has been below diabetic range since 2010 and has been off any diabetic medications since 06/2016. A1c 5.5% this admission.   Dispo: [name] is medically stable for discharge once SNF bed is available.   Herbert Beumer, DO 10/06/2017, 10:46 AM Pager: [phone]

## 2017-10-07 ENCOUNTER — Ambulatory Visit: Payer: Medicare Other | Admitting: Physical Medicine & Rehabilitation

## 2017-10-07 DIAGNOSIS — I480 Paroxysmal atrial fibrillation: Secondary | ICD-10-CM | POA: Diagnosis not present

## 2017-10-07 DIAGNOSIS — Z7901 Long term (current) use of anticoagulants: Secondary | ICD-10-CM | POA: Diagnosis not present

## 2017-10-07 DIAGNOSIS — M2550 Pain in unspecified joint: Secondary | ICD-10-CM | POA: Diagnosis not present

## 2017-10-07 DIAGNOSIS — I10 Essential (primary) hypertension: Secondary | ICD-10-CM | POA: Diagnosis not present

## 2017-10-07 DIAGNOSIS — G459 Transient cerebral ischemic attack, unspecified: Secondary | ICD-10-CM | POA: Diagnosis not present

## 2017-10-07 DIAGNOSIS — R4701 Aphasia: Secondary | ICD-10-CM | POA: Diagnosis not present

## 2017-10-07 DIAGNOSIS — R451 Restlessness and agitation: Secondary | ICD-10-CM | POA: Diagnosis not present

## 2017-10-07 DIAGNOSIS — Z8673 Personal history of transient ischemic attack (TIA), and cerebral infarction without residual deficits: Secondary | ICD-10-CM | POA: Diagnosis not present

## 2017-10-07 DIAGNOSIS — E785 Hyperlipidemia, unspecified: Secondary | ICD-10-CM | POA: Diagnosis not present

## 2017-10-07 DIAGNOSIS — Z87440 Personal history of urinary (tract) infections: Secondary | ICD-10-CM

## 2017-10-07 DIAGNOSIS — Z7401 Bed confinement status: Secondary | ICD-10-CM | POA: Diagnosis not present

## 2017-10-07 DIAGNOSIS — G40909 Epilepsy, unspecified, not intractable, without status epilepticus: Secondary | ICD-10-CM | POA: Diagnosis not present

## 2017-10-07 DIAGNOSIS — Z8639 Personal history of other endocrine, nutritional and metabolic disease: Secondary | ICD-10-CM | POA: Diagnosis not present

## 2017-10-07 DIAGNOSIS — Z950 Presence of cardiac pacemaker: Secondary | ICD-10-CM | POA: Diagnosis not present

## 2017-10-07 NOTE — NC FL2 (Signed)
[place] LEVEL OF CARE SCREENING TOOL     IDENTIFICATION  Patient Name: Herbert Marquez Birthdate: 1927-09-15 Sex: male Admission Date (Current Location): 10/04/2017  [facility] and [place] Number:  Herbalist and Address:  [facility], [place] [address], [place], [place] [address]      Provider Number: [id number]  Attending Physician Name and Address:  Oval Linsey, MD  Relative Name and Phone Number:       Current Level of Care: Hospital Recommended Level of Care: [place] Prior Approval Number:    Date Approved/Denied:   PASRR Number: [id number]  Discharge Plan: SNF    Current Diagnoses: Patient Active Problem List   Diagnosis Date Noted  . Cerebral thrombosis with cerebral infarction 10/05/2017  . Expressive aphasia 10/04/2017  . TIA (transient ischemic attack)   . Presence of permanent cardiac pacemaker   . Hypertension   . Hyperlipidemia   . Coronary artery disease   . Basal cell carcinoma   . Arthritis   . Pressure injury of skin 01/08/2017  . Stroke ([place]) 01/07/2017  . Ischemic stroke ([place])   . Second degree Mobitz II AV block 10/23/2016  . Seizures ([place]) 10/09/2016  . Healthcare maintenance 10/01/2016  . Restlessness and agitation 07/02/2016  . Vascular dementia with behavior disturbance 06/29/2016  . Hemiparesis and other late effects of cerebrovascular accident ([place]) 06/29/2016  . Embolic stroke ([place]) 96/29/5284  . Right hemiparesis ([place])   . History of CVA with residual deficit   . Chronic anticoagulation   . Atrial fibrillation with rapid ventricular response ([place])   . Seizure prophylaxis   . Diastolic dysfunction   . Bacteremia due to Gram-positive bacteria   . Altered mental status   . Dementia without behavioral disturbance   . Leukocytosis   . Acute blood loss anemia   . Acute encephalopathy 06/04/2016  . PAF (paroxysmal atrial fibrillation) ([place])   . Iron deficiency anemia  04/04/2016  . History of stroke 04/04/2016  . Cerebral hemorrhage (HCC) w/ SDH s/p IV tPA   . Coronary artery disease involving native coronary artery of native heart without angina pectoris   . Orthostatic hypotension   . History of right hip replacement   . Acute ischemic stroke ([place]) - L temporal lobe s/p tPA 03/30/2016  . BPH (benign prostatic hyperplasia) 11/25/2015  . GERD (gastroesophageal reflux disease) 11/25/2015  . CKD (chronic kidney disease), stage II 11/25/2015  . Overactive bladder   . Cerebrovascular accident (CVA) ([place]) 09/18/2015  . Gait disturbance, post-stroke 06/23/2015  . 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 Thalamic infarction ([place]) 06/21/2015  . Benign essential HTN   . Type 2 diabetes mellitus with complication, without long-term current use of insulin ([place])   . Dysphagia as late effect of cerebrovascular disease   . Hyponatremia   . HLD (hyperlipidemia)   . COPD (chronic obstructive pulmonary disease) ([place]) 03/09/2011    Orientation RESPIRATION BLADDER Height & Weight     (pt is confused per son. )  Normal Incontinent Weight: 164 lb 10.9 oz (74.7 kg) Height:  5\' 7"  (170.2 cm)  BEHAVIORAL SYMPTOMS/MOOD NEUROLOGICAL BOWEL NUTRITION STATUS      Incontinent Diet(please see discharge summary. )  AMBULATORY STATUS COMMUNICATION OF NEEDS Skin   Extensive Assist Verbally Normal                       Personal Care Assistance Level of Assistance  Bathing, Feeding, Dressing Bathing Assistance: Maximum assistance Feeding assistance: Limited assistance Dressing Assistance: Maximum assistance Total Care Assistance: Maximum assistance   Functional Limitations Info  Sight, Hearing, Speech Sight Info: Adequate Hearing Info: Impaired Speech Info: Adequate    SPECIAL CARE FACTORS FREQUENCY  PT (By licensed PT), OT (By licensed OT), Speech therapy     PT Frequency: 5 times a week  OT Frequency: 5 times a week      Speech Therapy Frequency: 5 times  a week       Contractures Contractures Info: Not present    Additional Factors Info  Code Status, Allergies Code Status Info: DNR Allergies Info: NKA           Current Medications (10/07/2017):  This is the current hospital active medication list Current Facility-Administered Medications  Medication Dose Route Frequency Provider Last Rate Last Dose  . acetaminophen (TYLENOL) tablet 650 mg  650 mg Oral Q6H PRN Molt, Bethany, DO       Or  . acetaminophen (TYLENOL) suppository 650 mg  650 mg Rectal Q6H PRN Molt, Bethany, DO      . apixaban (ELIQUIS) tablet 5 mg  5 mg Oral BID Molt, Bethany, DO   5 mg at 10/07/17 0840  . aspirin EC tablet 81 mg  81 mg Oral Daily Molt, Bethany, DO   81 mg at 10/07/17 0840  . atorvastatin (LIPITOR) tablet 80 mg  80 mg Oral q1800 Molt, Bethany, DO   80 mg at 10/06/17 1826  . HYDROcodone-acetaminophen (NORCO/VICODIN) 5-325 MG per tablet 1 tablet  1 tablet Oral Once PRN Molt, Bethany, DO      . levETIRAcetam (KEPPRA) tablet 750 mg  750 mg Oral BID Molt, Bethany, DO   750 mg at 10/07/17 0840  . QUEtiapine (SEROQUEL) tablet 25 mg  25 mg Oral QHS Molt, Bethany, DO   25 mg at 10/06/17 2129  . senna-docusate (Senokot-S) tablet 1 tablet  1 tablet Oral QHS PRN Molt, Bethany, DO         Discharge Medications: Please see discharge summary for a list of discharge medications.  Relevant Imaging Results:  Relevant Lab Results:   Additional Information [id number]  Geralynn Ochs, LCSW

## 2017-10-07 NOTE — Progress Notes (Signed)
Patient discharged to [facility] in [place], via [facility], both hearing aids were in patients ears when discharged

## 2017-10-07 NOTE — Discharge Summary (Signed)
Name: Herbert Marquez: [id number] DOB: [birth year] 84 y.o. PCP: [name], NP  Date of Admission: 10/04/2017  1:33 PM Date of Discharge: 10/07/2017 Attending Physician: Oval Linsey, MD  Discharge Diagnosis: 1. TIA   Discharge Medications: Allergies as of 10/07/2017   No Known Allergies     Medication List    TAKE these medications   acetaminophen 325 MG tablet Commonly known as:  TYLENOL Take 2 tablets (650 mg total) every 4 (four) hours as needed by mouth for mild pain (or temp > 37.5 C (99.5 F)).   apixaban 5 MG Tabs tablet Commonly known as:  ELIQUIS Take 1 tablet (5 mg total) by mouth 2 (two) times daily. Resume 10/25/16 evening dose   ARTIFICIAL TEARS 1.4 % ophthalmic solution Generic drug:  polyvinyl alcohol Place 1 drop into both eyes daily as needed for dry eyes.   aspirin EC 81 MG tablet Take 1 tablet (81 mg total) by mouth daily.   atorvastatin 80 MG tablet Commonly known as:  LIPITOR TAKE 1 TABLET BY MOUTH DAILY. PATIENT MUST HAVE OFFICE VISIT PRIOR TO ANY FURTHER REFILLS   ENSURE Take 237 mLs by mouth daily as needed (nutrition).   levETIRAcetam 750 MG tablet Commonly known as:  KEPPRA Take 1 tablet (750 mg total) by mouth 2 (two) times daily.   mirabegron ER 25 MG Tb24 tablet Commonly known as:  MYRBETRIQ Take 25 mg by mouth daily.   multivitamin with minerals Tabs tablet Take 1 tablet by mouth daily.   nitroGLYCERIN 0.4 MG SL tablet Commonly known as:  NITROSTAT Place 1 tablet (0.4 mg total) under the tongue every 5 (five) minutes as needed for chest pain.   phenazopyridine 95 MG tablet Commonly known as:  PYRIDIUM Take 95 mg by mouth 3 (three) times daily as needed for pain.   phenylephrine-shark liver oil-mineral oil-petrolatum 0.25-3-14-71.9 % rectal ointment Commonly known as:  PREPARATION H Place 1 application rectally as needed for hemorrhoids.   QUEtiapine 25 MG tablet Commonly known as:  SEROQUEL Take 1 tablet (25 mg  total) by mouth at bedtime. PATIENT MUST HAVE OFFICE VISIT PRIOR TO ANY FURTHER REFILLS   ranitidine 150 MG tablet Commonly known as:  ZANTAC Take 150 mg by mouth as needed for heartburn.       Disposition and follow-up:   HerbertIsrael C Brailsford was discharged from [facility] in Stable condition.  At the hospital follow up visit please address:  1.  TIA- reasess neurologic status for no recurrent symptoms    2.  Labs / imaging needed at time of follow-up: none  3.  Pending labs/ test needing follow-up: none  Follow-up Appointments:  [name], NP [place] / [place] [id number] [facility] Hospital Course by problem list: 1. Expressive aphasia most likely due to TIA: Herbert Marquez presented with SOB, confusion and expressive aphasia and a code stroke was called. He was not given tPA due to hemorrhagic conversion in 03/2016 after tPa was administered. His CT and MRI were negative for acute abnormalities. His symptoms completely resolved in less than 24 hours. He is being sent to a SNF. 2. Hypertension: 122/80 this morning. Per neurology recommendations, permissive HTN for 24 hours. Per chart review, he is not on any antihypertensives, withwell controlled BP since 2015. 3. DM: Patient has a previous diagnosis of DM; however, per extensive chart review his A1c has been below diabetic range since 2010 and has been off his  diabetic medications since 06/2016.    Discharge Vitals:   BP 124/69 (BP Location: Right Arm)   Pulse 70   Temp 98.6 F (37 C) (Oral)   Resp 18   Ht 5\' 7"  (1.702 m)   Wt 164 lb 10.9 oz (74.7 kg)   SpO2 95%   BMI 25.79 kg/m   Pertinent Labs, Studies, and Procedures:   Ct Angio Head W Or Marquez Contrast  Result Date: 10/04/2017 CLINICAL DATA:  Cerebral hemorrhage suspected. Negative CT head today. EXAM: CT ANGIOGRAPHY HEAD AND NECK CT PERFUSION BRAIN TECHNIQUE: Multidetector CT imaging of the head and neck was performed using the  standard protocol during bolus administration of intravenous contrast. Multiplanar CT image reconstructions and MIPs were obtained to evaluate the vascular anatomy. Carotid stenosis measurements (when applicable) are obtained utilizing NASCET criteria, using the distal internal carotid diameter as the denominator. Multiphase CT imaging of the brain was performed following IV bolus contrast injection. Subsequent parametric perfusion maps were calculated using RAPID software. CONTRAST:  168mL ISOVUE-370 IOPAMIDOL (ISOVUE-370) INJECTION 76% COMPARISON:  CT head earlier today, negative for acute hemorrhage. CTA head and neck 01/07/2017 FINDINGS: CTA NECK FINDINGS Aortic arch: Atherosclerotic calcification aortic arch and proximal great vessels without significant stenosis. Negative for aneurysm or dissection. Right carotid system: The patient moved during scanning through the bifurcation limiting evaluation. There is atherosclerotic disease at the right carotid bifurcation. Estimated 50% diameter stenosis right internal carotid artery unchanged from the prior CTA Left carotid system: Patient moved during scanning through the bifurcation causing blurring. Atherosclerotic calcification in the proximal left internal carotid artery with approximately 40% diameter stenosis unchanged. 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 Left vertebral artery dominant and widely patent without stenosis. Skeleton: 5 mm anterolisthesis C7-T1 unchanged. Mild anterolisthesis C4-5 and C5-6 unchanged. Multilevel degenerative change without acute skeletal abnormality. Other neck: Negative for mass or adenopathy. Upper chest: Extensive apical emphysema and scarring. Review of the MIP images confirms the above findings CTA HEAD FINDINGS Anterior circulation: Atherosclerotic calcification in the cavernous carotid bilaterally with moderate stenosis bilaterally. Anterior and middle  cerebral arteries patent bilaterally without large vessel occlusion. Mild atherosclerotic irregularity in the M2 branches bilaterally. Both anterior cerebral arteries patent without significant stenosis. Posterior circulation: Chronic occlusion distal right vertebral artery. Left vertebral artery widely patent the basilar. Left PICA patent. Right PICA patent and supplied from retrograde flow in the distal right vertebral artery. Basilar widely patent. Left AICA patent. Right AICA not visualized. Superior cerebellar and posterior cerebral arteries patent bilaterally without stenosis. Venous sinuses: Not well opacified due to timing of the scan. Anatomic variants: None Delayed phase: Not perform Review of the MIP images confirms the above findings CT Brain Perfusion Findings: CBF (<30%) Volume: 94mL Perfusion (Tmax>6.0s) volume: 23mL Mismatch Volume: 71mL Infarction Location:Right posterior cerebellum IMPRESSION: 1. CT perfusion suggests delayed perfusion in the right posterior cerebellum without core infarct. 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 MRI would be necessary to determine if acute infarct is present. 2. Atherosclerotic calcification of the carotid bifurcation bilaterally and in the cavernous carotid bilaterally without flow limiting stenosis. 3. Negative for emergent large vessel intracranial occlusion. Electronically Signed   By: Franchot Gallo M.D.   On: 10/04/2017 14:28   X-ray Chest Pa And Lateral  Result Date: 10/04/2017 CLINICAL DATA:  Shortness of breath.  Dysarthria. EXAM:  CHEST - 2 VIEW COMPARISON:  01/07/2017. FINDINGS: Borderline enlarged cardiac silhouette with an interval decrease in size. The aorta remains tortuous and calcified. No significant change in diffuse patchy interstitial prominence. No pleural fluid. Stable left subclavian pacemaker leads. Diffuse osteopenia.  IMPRESSION: Stable interstitial fibrosis.  No acute abnormality. Electronically Signed   By: Claudie Revering M.D.   On: 10/04/2017 20:07   Ct Angio Neck W Or Marquez Contrast  Result Date: 10/04/2017 CLINICAL DATA:  Cerebral hemorrhage suspected. Negative CT head today. EXAM: CT ANGIOGRAPHY HEAD AND NECK CT PERFUSION BRAIN TECHNIQUE: Multidetector CT imaging of the head and neck was performed using the standard protocol during bolus administration of intravenous contrast. Multiplanar CT image reconstructions and MIPs were obtained to evaluate the vascular anatomy. Carotid stenosis measurements (when applicable) are obtained utilizing NASCET criteria, using the distal internal carotid diameter as the denominator. Multiphase CT imaging of the brain was performed following IV bolus contrast injection. Subsequent parametric perfusion maps were calculated using RAPID software. CONTRAST:  193mL ISOVUE-370 IOPAMIDOL (ISOVUE-370) INJECTION 76% COMPARISON:  CT head earlier today, negative for acute hemorrhage. CTA head and neck 01/07/2017 FINDINGS: CTA NECK FINDINGS Aortic arch: Atherosclerotic calcification aortic arch and proximal great vessels without significant stenosis. Negative for aneurysm or dissection. Right carotid system: The patient moved during scanning through the bifurcation limiting evaluation. There is atherosclerotic disease at the right carotid bifurcation. Estimated 50% diameter stenosis right internal carotid artery unchanged from the prior CTA Left carotid system: Patient moved during scanning through the bifurcation causing blurring. Atherosclerotic calcification in the proximal left internal carotid artery with approximately 40% diameter stenosis unchanged. 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 Left vertebral artery dominant and widely patent without stenosis. Skeleton: 5 mm anterolisthesis C7-T1 unchanged. Mild anterolisthesis C4-5 and  C5-6 unchanged. Multilevel degenerative change without acute skeletal abnormality. Other neck: Negative for mass or adenopathy. Upper chest: Extensive apical emphysema and scarring. Review of the MIP images confirms the above findings CTA HEAD FINDINGS Anterior circulation: Atherosclerotic calcification in the cavernous carotid bilaterally with moderate stenosis bilaterally. Anterior and middle cerebral arteries patent bilaterally without large vessel occlusion. Mild atherosclerotic irregularity in the M2 branches bilaterally. Both anterior cerebral arteries patent without significant stenosis. Posterior circulation: Chronic occlusion distal right vertebral artery. Left vertebral artery widely patent the basilar. Left PICA patent. Right PICA patent and supplied from retrograde flow in the distal right vertebral artery. Basilar widely patent. Left AICA patent. Right AICA not visualized. Superior cerebellar and posterior cerebral arteries patent bilaterally without stenosis. Venous sinuses: Not well opacified due to timing of the scan. Anatomic variants: None Delayed phase: Not perform Review of the MIP images confirms the above findings CT Brain Perfusion Findings: CBF (<30%) Volume: 47mL Perfusion (Tmax>6.0s) volume: 80mL Mismatch Volume: 15mL Infarction Location:Right posterior cerebellum IMPRESSION: 1. CT perfusion suggests delayed perfusion in the right posterior cerebellum without core infarct. 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 MRI would be necessary to determine if acute infarct is present. 2. Atherosclerotic calcification of the carotid bifurcation bilaterally and in the cavernous carotid bilaterally without flow limiting stenosis. 3. Negative for emergent large vessel intracranial occlusion. Electronically Signed   By: Franchot Gallo M.D.   On: 10/04/2017 14:28   Herbert Marquez  Contrast  Result Date: 10/05/2017 CLINICAL DATA:  Aphasia. EXAM: MRI HEAD WITHOUT CONTRAST TECHNIQUE:  Multiplanar, multiecho pulse sequences of the brain and surrounding structures were obtained without intravenous contrast. COMPARISON:  Head CT/CTA/perfusion 10/04/2017. Brain MRI 01/08/2017. FINDINGS: Brain: There is no evidence of acute infarct, intracranial hemorrhage, mass, midline shift, or extra-axial fluid collection. Small chronic cortical infarcts are again seen in the right occipital and right parietal lobes. There is also chronic encephalomalacia in the anterior left temporal lobe, and there is a chronic lacunar infarct in the left thalamus. Patchy cerebral white matter T2 hyperintensities are similar to the prior MRI and nonspecific but compatible with moderate chronic small vessel ischemic disease. There is moderate cerebral atrophy. Vascular: Major intracranial arterial flow voids are preserved. Left transverse and sigmoid sinus T2 hyperintensity is favored to reflect slow flow with occlusion considered less likely. Skull and upper cervical spine: Unremarkable bone marrow signal. Cervical disc and facet degeneration with grade 1 anterolisthesis of C4 on C5. Sinuses/Orbits: Bilateral cataract extraction. Clear paranasal sinuses. Trace right mastoid effusion. Other: None. IMPRESSION: 1. No acute intracranial abnormality. 2. Moderate chronic small vessel ischemic disease and cerebral atrophy. Electronically Signed   By: Logan Bores M.D.   On: 10/05/2017 15:54   Ct Cerebral Perfusion W Contrast  Result Date: 10/04/2017 CLINICAL DATA:  Cerebral hemorrhage suspected. Negative CT head today. EXAM: CT ANGIOGRAPHY HEAD AND NECK CT PERFUSION BRAIN TECHNIQUE: Multidetector CT imaging of the head and neck was performed using the standard protocol during bolus administration of intravenous contrast. Multiplanar CT image reconstructions and MIPs were obtained to evaluate the vascular anatomy. Carotid stenosis  measurements (when applicable) are obtained utilizing NASCET criteria, using the distal internal carotid diameter as the denominator. Multiphase CT imaging of the brain was performed following IV bolus contrast injection. Subsequent parametric perfusion maps were calculated using RAPID software. CONTRAST:  14mL ISOVUE-370 IOPAMIDOL (ISOVUE-370) INJECTION 76% COMPARISON:  CT head earlier today, negative for acute hemorrhage. CTA head and neck 01/07/2017 FINDINGS: CTA NECK FINDINGS Aortic arch: Atherosclerotic calcification aortic arch and proximal great vessels without significant stenosis. Negative for aneurysm or dissection. Right carotid system: The patient moved during scanning through the bifurcation limiting evaluation. There is atherosclerotic disease at the right carotid bifurcation. Estimated 50% diameter stenosis right internal carotid artery unchanged from the prior CTA Left carotid system: Patient moved during scanning through the bifurcation causing blurring. Atherosclerotic calcification in the proximal left internal carotid artery with approximately 40% diameter stenosis unchanged. 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 Left vertebral artery dominant and widely patent without stenosis. Skeleton: 5 mm anterolisthesis C7-T1 unchanged. Mild anterolisthesis C4-5 and C5-6 unchanged. Multilevel degenerative change without acute skeletal abnormality. Other neck: Negative for mass or adenopathy. Upper chest: Extensive apical emphysema and scarring. Review of the MIP images confirms the above findings CTA HEAD FINDINGS Anterior circulation: Atherosclerotic calcification in the cavernous carotid bilaterally with moderate stenosis bilaterally. Anterior and middle cerebral arteries patent bilaterally without large vessel occlusion. Mild atherosclerotic irregularity in the M2 branches bilaterally. Both anterior cerebral arteries patent without  significant stenosis. Posterior circulation: Chronic occlusion distal right vertebral artery. Left vertebral artery widely patent the basilar. Left PICA patent. Right PICA patent and supplied from retrograde flow in the distal right vertebral artery. Basilar widely patent. Left AICA patent. Right AICA not visualized. Superior cerebellar and posterior cerebral arteries patent bilaterally without stenosis. Venous sinuses: Not well opacified due to timing of the scan. Anatomic variants: None Delayed phase: Not perform Review of the MIP images confirms the above findings CT  Brain Perfusion Findings: CBF (<30%) Volume: 46mL Perfusion (Tmax>6.0s) volume: 65mL Mismatch Volume: 5mL Infarction Location:Right posterior cerebellum IMPRESSION: 1. CT perfusion suggests delayed perfusion in the right posterior cerebellum without core infarct. 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 MRI would be necessary to determine if acute infarct is present. 2. Atherosclerotic calcification of the carotid bifurcation bilaterally and in the cavernous carotid bilaterally without flow limiting stenosis. 3. Negative for emergent large vessel intracranial occlusion. Electronically Signed   By: Franchot Gallo M.D.   On: 10/04/2017 14:28   Ct Head Code Stroke Marquez Contrast  Result Date: 10/04/2017 CLINICAL DATA:  Code stroke. Confusion. Last seen well 2 hours ago. TIA. EXAM: CT HEAD WITHOUT CONTRAST TECHNIQUE: Contiguous axial images were obtained from the base of the skull through the vertex without intravenous contrast. COMPARISON:  MRI of the brain 01/08/2017. FINDINGS: Brain: Remote lacunar infarcts involving the left thalamus is again noted. Basal ganglia are otherwise intact. Insular ribbon is normal bilaterally. Diffuse atrophy and white matter disease is present bilaterally. Acute hemorrhage or mass lesion is present.  Ventricles are proportionate to the degree of atrophy. No significant extra-axial fluid collection present. The brainstem and cerebellum are normal. Vascular: Atherosclerotic calcifications are present within the cavernous internal carotid arteries bilaterally. There is no hyperdense vessel. Skull: Calvarium is intact. No focal lytic or blastic lesions are present. Extracranial soft tissues are within normal limits. Sinuses/Orbits: The paranasal sinuses and mastoid air cells are clear. Globes and orbits are within normal limits. ASPECTS [facility] Stroke Program Early CT Score) - Ganglionic level infarction (caudate, lentiform nuclei, internal capsule, insula, M1-M3 cortex): 7/7 - Supraganglionic infarction (M4-M6 cortex): 3/3 Total score (0-10 with 10 being normal): 10/10 IMPRESSION: 1. No acute intracranial abnormality or significant interval change. 2. Remote lacunar infarct of the left thalamus. 3. Stable chronic advanced atrophy and white matter disease. This likely reflects the sequela of chronic microvascular ischemia. 4. ASPECTS is 10/10 The above was relayed via text pager to Dr. Kerney Elbe on 10/04/2017 at 13:52 . Electronically Signed   By: San Morelle M.D.   On: 10/04/2017 13:52     Discharge Instructions: Herbert. Marquez is medically stable. He is to continue taking medications as prescribed and to follow up with his PCP.   SignedMike Craze, DO 10/07/2017, 11:49 AM   Pager: [phone]

## 2017-10-07 NOTE — Clinical Social Work Placement (Signed)
Nurse to call report to [phone], Room [place]  NOTE  Date:  10/07/2017  Patient Details  Name: Herbert Marquez MRN: [id number] Date of Birth: 05/18/1927  Clinical Social Work is seeking post-discharge placement for this patient at the [place] level of care (*CSW will initial, date and re-position this form in  chart as items are completed):  Yes   Patient/family provided with [place] Work Department's list of facilities offering this level of care within the geographic area requested by the patient (or if unable, by the patient's family).  Yes   Patient/family informed of their freedom to choose among providers that offer the needed level of care, that participate in Medicare, Medicaid or managed care program needed by the patient, have an available bed and are willing to accept the patient.  Yes   Patient/family informed of [facility]'s ownership interest in [facility] and [facility], as well as of the fact that they are under no obligation to receive care at these facilities.  PASRR submitted to EDS on 10/07/17     PASRR number received on 10/07/17     Existing PASRR number confirmed on       FL2 transmitted to all facilities in geographic area requested by pt/family on 10/07/17     FL2 transmitted to all facilities within larger geographic area on       Patient informed that his/her managed care company has contracts with or will negotiate with certain facilities, including the following:        Yes   Patient/family informed of bed offers received.  Patient chooses bed at [place], [place]     Physician recommends and patient chooses bed at      Patient to be transferred to [place] on 10/07/17.  Patient to be transferred to facility by [facility]     Patient family notified on 10/07/17 of transfer.  Name of family member notified:  [name]     PHYSICIAN        Additional Comment:    _______________________________________________ Geralynn Ochs, [place] 10/07/2017, 12:29 PM

## 2017-10-07 NOTE — Plan of Care (Signed)
Adequate for discharge.

## 2017-10-07 NOTE — Progress Notes (Signed)
Occupational Therapy Treatment Patient Details Name: Herbert Marquez MRN: [id number] DOB: 02/19/1928 Today's Date: 10/07/2017    History of present illness 84 yo male with onset of expressive aphasia and R side weakness with facial droop and new SOB, confusion was admitted.  He is HOH, unable to have MRI initially due to pacemaker.  PMHx:  stroke x 3, OA, basal cell CA, CAD, anterolisthesis C4-5, C5-6 and C6-7, pulm fibrosis, pacemaker x 2 years   OT comments  Pt reports plan to d/c to [facility] today.  Pt and family (daughter present) report pt back to baseline, with no further c/o SOB and speech back to baseline.  Completed squat pivot transfer to recliner with min assist.  Educated pt and pt's daughter on stroke symptoms "BE FAST" with pt reporting understanding and that his wife is very conscientious about stroke symptoms/warning signs.  Pt will continue to benefit from OT services in next venue of care.   Follow Up Recommendations  SNF    Equipment Recommendations  None recommended by OT    Recommendations for Other Services      Precautions / Restrictions Precautions Precautions: Fall Precaution Comments: cervical spine anterolisthesis multi level       Mobility Bed Mobility Overal bed mobility: Needs Assistance Bed Mobility: Supine to Sit     Supine to sit: Supervision        Transfers Overall transfer level: Needs assistance Equipment used: 1 person hand held assist Transfers: Squat Pivot Transfers;Sit to/from Stand Sit to Stand: Min assist   Squat pivot transfers: Min assist     General transfer comment: Min A to power up into standing and then MIn A squat pivot EOB<>recliner        ADL either performed or assessed with clinical judgement   ADL Overall ADL's : Needs assistance/impaired                         Toilet Transfer: Stand-pivot;Minimal assistance [employer] forces technical officer Details (indicate cue type and reason): Min A for safety and balance  during          Functional mobility during ADLs: Minimal assistance(sit > stand and stand pivot only) General ADL Comments: Pt presenting with decreased balance and strength compared to baseline. Pt's wife provided assistance at baseline.               Cognition Arousal/Alertness: Awake/alert Behavior During Therapy: WFL for tasks assessed/performed Overall Cognitive Status: Within Functional Limits for tasks assessed                                                     Pertinent Vitals/ Pain       Pain Assessment: No/denies pain         Frequency  Min 2X/week        Progress Toward Goals  OT Goals(current goals can now be found in the care plan section)  Progress towards OT goals: Progressing toward goals  Acute Rehab OT Goals Patient Stated Goal: "Go to rehab and get stronger." OT Goal Formulation: With patient Time For Goal Achievement: 10/19/17 Potential to Achieve Goals: Good  Plan Discharge plan remains appropriate       AM-PAC PT "6 Clicks" Daily Activity     Outcome Measure   Help from another person eating meals?:  A Little Help from another person taking care of personal grooming?: A Little Help from another person toileting, which includes using toliet, bedpan, or urinal?: A Little Help from another person bathing (including washing, rinsing, drying)?: A Lot Help from another person to put on and taking off regular upper body clothing?: A Little Help from another person to put on and taking off regular lower body clothing?: A Lot 6 Click Score: 16    End of Session Equipment Utilized During Treatment: Gait belt  OT Visit Diagnosis: Unsteadiness on feet (R26.81);Other abnormalities of gait and mobility (R26.89);Muscle weakness (generalized) (M62.81)   Activity Tolerance Patient tolerated treatment well   Patient Left in chair;with call bell/phone within reach;with family/visitor present   Nurse Communication Mobility  status        Time: 2481-8590 OT Time Calculation (min): 15 min  Charges: OT General Charges $OT Visit: 1 Visit OT Treatments $Self Care/Home Management : 8-22 mins   [name], [phone] 10/07/2017, 12:52 PM

## 2017-10-07 NOTE — Progress Notes (Signed)
Internal Medicine Attending  Date: 10/07/2017  Patient name: Herbert Marquez Medical record number: [id number] Date of birth: 1927-03-30 Age: 84 y.o. Gender: male  I saw and evaluated the patient. I reviewed the resident's note by Dr. Laural Golden and I agree with the resident's findings and plans as documented in her progress note.  On rounds this morning Mr. Thorpe was feeling well and ready did be transferred to the skilled nursing facility for further rehabilitation before returning home. He remained without acute complaints and his speech was clear without any errors. I agree with transfer to the skilled nursing facility today.

## 2017-10-07 NOTE — Progress Notes (Signed)
   Subjective: Mr. Vilardi reported feeling well today. He denied any SOB, headaches, blurry vision or weakness. He reported being very happy about seeing his wife in the hospital last night and being able to surprise her with a visit. His daughter was present during our interview and informed us they prefer SNF placement vs. Home health services.   Objective:  Vital signs in last 24 hours: Vitals:   10/06/17 2045 10/06/17 2330 10/07/17 0326 10/07/17 0757  BP: 120/78 116/80 124/69   Pulse: 62 70    Resp: 17 16 18    Temp: 98.8 F (37.1 C) 98.5 F (36.9 C) 98.3 F (36.8 C) 98.6 F (37 C)  TempSrc: Oral Oral Axillary Oral  SpO2: 93% 95%    Weight:      Height:       Physical exam: General-seen lying in bed in acute distress Cardio: RRR, no murmrus Neuro: awake alert and oriented x3, slight weakness in right LE 4/5 strength, slight deviation of his tongue towards the left, otherwise unremarkable exam  Assessment/Plan:  Mr. Dinino is an 84 year old male with a medical history significant of CVA, seizure disorder, PAF on eliquis, DM, HTN, hyperlipidemia, and presence of permanent cardiac pacemaker who presents with stroke like symptoms of expressive dysphagia, confusion and SOB. A code stroke was called on arrival. He was not given tPA due to a history of hemorraghic conversion on tPA in 01/18. He has improved significantly since admission and able to fully communicate during our assessment.   Active Problems:   TIA (transient ischemic attack)   Expressive aphasia   Cerebral thrombosis with cerebral infarction  Expressive aphasia most likely due to TIA: Mr. Skillen presented with code stroke. He was confused, SOB and having expressive aphasia on arrival. His symptoms completely resolved in less than 24 hours. His CT and MRI were negative for any acute abnormality.   Agitation- Mr. Luckadoo was found to be agitated and tachycardic on his second night of admission. His family said in the past he  has presented similarly due to a UTI. A UA was found to be unremarkable and negative for a UTI. He was able to see his wife the next day and this seemed to help his symptoms improve.  Hypertension: 122/80 this morning. Per neurology recommendations, permissive HTN for 24 hours. Per chart review, he is not on any antihypertensives, withwell controlled BP since 2015.  DM: Patient has a previous diagnosis of DM; however, per extensive chart review his A1c has been below diabetic range since 2010 and has been off his diabetic medications since 06/2016.   Dispo: Mr. Lashley is medically stable for discharge.  Mike Craze, DO 10/07/2017, 12:01 PM Pager: [phone]

## 2017-10-08 DIAGNOSIS — R2681 Unsteadiness on feet: Secondary | ICD-10-CM | POA: Diagnosis not present

## 2017-10-08 DIAGNOSIS — F0391 Unspecified dementia with behavioral disturbance: Secondary | ICD-10-CM | POA: Diagnosis not present

## 2017-10-08 DIAGNOSIS — R278 Other lack of coordination: Secondary | ICD-10-CM | POA: Diagnosis not present

## 2017-10-08 DIAGNOSIS — M6281 Muscle weakness (generalized): Secondary | ICD-10-CM | POA: Diagnosis not present

## 2017-10-09 DIAGNOSIS — F0391 Unspecified dementia with behavioral disturbance: Secondary | ICD-10-CM | POA: Diagnosis not present

## 2017-10-09 DIAGNOSIS — R2681 Unsteadiness on feet: Secondary | ICD-10-CM | POA: Diagnosis not present

## 2017-10-09 DIAGNOSIS — R278 Other lack of coordination: Secondary | ICD-10-CM | POA: Diagnosis not present

## 2017-10-09 DIAGNOSIS — M6281 Muscle weakness (generalized): Secondary | ICD-10-CM | POA: Diagnosis not present

## 2017-10-10 DIAGNOSIS — N400 Enlarged prostate without lower urinary tract symptoms: Secondary | ICD-10-CM | POA: Diagnosis not present

## 2017-10-10 DIAGNOSIS — G459 Transient cerebral ischemic attack, unspecified: Secondary | ICD-10-CM | POA: Diagnosis not present

## 2017-10-10 DIAGNOSIS — J449 Chronic obstructive pulmonary disease, unspecified: Secondary | ICD-10-CM | POA: Diagnosis not present

## 2017-10-10 DIAGNOSIS — I4891 Unspecified atrial fibrillation: Secondary | ICD-10-CM | POA: Diagnosis not present

## 2017-10-10 DIAGNOSIS — I10 Essential (primary) hypertension: Secondary | ICD-10-CM | POA: Diagnosis not present

## 2017-10-10 DIAGNOSIS — R2681 Unsteadiness on feet: Secondary | ICD-10-CM | POA: Diagnosis not present

## 2017-10-10 DIAGNOSIS — M6281 Muscle weakness (generalized): Secondary | ICD-10-CM | POA: Diagnosis not present

## 2017-10-10 DIAGNOSIS — E119 Type 2 diabetes mellitus without complications: Secondary | ICD-10-CM | POA: Diagnosis not present

## 2017-10-10 DIAGNOSIS — I2510 Atherosclerotic heart disease of native coronary artery without angina pectoris: Secondary | ICD-10-CM | POA: Diagnosis not present

## 2017-10-10 DIAGNOSIS — R278 Other lack of coordination: Secondary | ICD-10-CM | POA: Diagnosis not present

## 2017-10-10 DIAGNOSIS — F0391 Unspecified dementia with behavioral disturbance: Secondary | ICD-10-CM | POA: Diagnosis not present

## 2017-10-11 DIAGNOSIS — R2681 Unsteadiness on feet: Secondary | ICD-10-CM | POA: Diagnosis not present

## 2017-10-11 DIAGNOSIS — R278 Other lack of coordination: Secondary | ICD-10-CM | POA: Diagnosis not present

## 2017-10-11 DIAGNOSIS — M6281 Muscle weakness (generalized): Secondary | ICD-10-CM | POA: Diagnosis not present

## 2017-10-11 DIAGNOSIS — F0391 Unspecified dementia with behavioral disturbance: Secondary | ICD-10-CM | POA: Diagnosis not present

## 2017-10-14 ENCOUNTER — Ambulatory Visit (INDEPENDENT_AMBULATORY_CARE_PROVIDER_SITE_OTHER): Payer: Medicare Other | Admitting: Adult Health

## 2017-10-14 ENCOUNTER — Encounter: Payer: Self-pay | Admitting: Adult Health

## 2017-10-14 VITALS — BP 98/63 | HR 88

## 2017-10-14 DIAGNOSIS — G459 Transient cerebral ischemic attack, unspecified: Secondary | ICD-10-CM

## 2017-10-14 DIAGNOSIS — I2510 Atherosclerotic heart disease of native coronary artery without angina pectoris: Secondary | ICD-10-CM

## 2017-10-14 DIAGNOSIS — Z0000 Encounter for general adult medical examination without abnormal findings: Secondary | ICD-10-CM

## 2017-10-14 DIAGNOSIS — R031 Nonspecific low blood-pressure reading: Secondary | ICD-10-CM

## 2017-10-14 HISTORY — DX: Nonspecific low blood-pressure reading: R03.1

## 2017-10-14 NOTE — Patient Instructions (Addendum)
Hospital Discharge After a Stroke  °Being discharged from the hospital after a stroke can feel overwhelming. Many things may be different, and it is normal to feel scared or anxious. Some stroke survivors may be able to return to their homes, and others may need more specialized care on a temporary or permanent basis. °Your stroke care team will work with you to develop a discharge plan that is best for you. Ask questions if you do not understand something. Invite a friend or family member to participate in discharge planning. Understanding and following your discharge plan can help to prevent another stroke or other problems. °Understanding your medicines °After a stroke, your health care provider may prescribe one or more types of medicine. It is important to take medicines exactly as told by your health care provider. Serious harm, such as another stroke, can happen if you are unable to take your medicine exactly as prescribed. Make sure you understand: °· What medicine to take. °· Why you are taking the medicine. °· How and when to take it. °· If it can be taken with your other medicines and herbal supplements. °· Possible side effects. °· When to call your health care provider if you have any side effects. °· How you will get and pay for your medicines. Medical assistance programs may be able to help you pay for prescription medicines if you cannot afford them. ° °If you are taking an anticoagulant, be sure to take it exactly as told by your health care provider. This type of medicine can increase the risk of bleeding because it works to prevent blood from clotting. You may need to take certain precautions to prevent bleeding. You should contact your health care provider if you have: °· Bleeding or bruising. °· A fall or other injury to your head. °· Blood in your urine or stool (feces). ° °Planning for home safety °Take steps to prevent falls, such as installing grab bars or using a shower chair. Ask a friend  or family member to get needed things in place before you go home if possible. A therapist can come to your home to make recommendations for safety equipment. Ask your health care provider if you would benefit from this service or from home care. °Getting needed equipment °Ask your health care provider for a list of any medical equipment and supplies you will need at home. These may include items such as: °· Walkers. °· Canes. °· Wheelchairs. °· Hand-strengthening devices. °· Special eating utensils. ° °Medical equipment can be rented or purchased, depending on your insurance coverage. Check with your insurance company about what is covered. °Keeping follow-up visits °After a stroke, you will need to follow up regularly with a health care provider. You may also need rehabilitation, which can include physical therapy, occupational therapy, or speech-language therapy. °Keeping these appointments is very important to your recovery after a stroke. Be sure to bring your medicine list and discharge papers with you to your appointments. If you need help to keep track of your schedule, use a calendar or appointment reminder. °Preventing another stroke °Having a stroke puts you at risk for another stroke in the future. Ask your health care provider what actions you can take to lower the risk. These may include: °· Increasing how much you exercise. °· Making a healthy eating plan. °· Quitting smoking. °· Managing other health conditions, such as high blood pressure, high cholesterol, or diabetes. °· Limiting alcohol use. ° °Knowing the warning signs of a stroke °  Make sure you understand the signs of a stroke. Before you leave the hospital, you will receive information outlining the stroke warning signs. Share these with your friends and family members. "BE FAST" is an easy way to remember the main warning signs of a stroke:  B - Balance. Signs are dizziness, sudden trouble walking, or loss of balance.  E - Eyes. Signs  are trouble seeing or a sudden change in vision.  F - Face. Signs are sudden weakness or numbness of the face, or the face or eyelid drooping on one side.  A - Arms. Signs are weakness or numbness in an arm. This happens suddenly and usually on one side of the body.  S - Speech. Signs are sudden trouble speaking, slurred speech, or trouble understanding what people say.  T - Time. Time to call emergency services. Write down what time symptoms started.  Other signs of stroke may include:  A sudden, severe headache with no known cause.  Nausea or vomiting.  Seizure.  These symptoms may represent a serious problem that is an emergency. Do not wait to see if the symptoms will go away. Get medical help right away. Call your local emergency services (911 in the [place] Do not drive yourself to the hospital. Make note of the time that you had your first symptoms. Your emergency responders or emergency room staff will need to know this information. Summary  Being discharged from the hospital after a stroke can feel overwhelming. It is normal to feel scared or anxious.  Make sure you take medicines exactly as told by your health care provider.  Know the warning signs of a stroke, and get help right way if you have any of these symptoms. "BE FAST" is an easy way to remember the main warning signs of a stroke. This information is not intended to replace advice given to you by your health care provider. Make sure you discuss any questions you have with your health care provider. Document Released: 05/25/2016 Document Revised: 05/25/2016 Document Reviewed: 05/25/2016 Elsevier Interactive Patient Education  [employer].   Please continue all medications as directed. Continue with home health as directed. Recommend keeping at least two urinals at bedside. Recommend that you call your Cardiologist and share that your systolic blood pressure has been running <100 Please keep your Neurology  appt- 10/21/17 Follow-up here in 3 months. NICE TO SEE YOU!

## 2017-10-14 NOTE — Assessment & Plan Note (Signed)
No labs or imaging required No change to current medication regime. Continue with home health as directed. Recommend keeping at least two urinals at bedside. Recommend that you call your Cardiologist and share that your systolic blood pressure has been running <100 Please keep your Neurology appt- 10/21/17

## 2017-10-14 NOTE — Progress Notes (Signed)
Subjective:    Patient ID: Herbert Marquez, male    DOB: 27-Jul-1927, 84 y.o.   MRN: [id number]  HPI:  Herbert Marquez presents for hospital f/u 10/04/2017-10/07/2017 1. Expressive aphasia most likely due to TIA: Herbert Marquez presented with SOB, confusion and expressive aphasia and a code stroke was called. Herbert Marquez was not given tPA due to hemorrhagic conversion in 03/2016 after tPa was administered. His CT and MRI were negative for acute abnormalities. His symptoms completely resolved in less than 24 hours. Herbert Marquez is being sent to a SNF. 2. Hypertension:122/80 this morning.Per neurology recommendations, permissive HTN for 24 hours. Per chart review, Herbert Marquez is not on any antihypertensives, withwell controlled BP since 2015. 3. DM: Patient has a previous diagnosis of DM; however, per extensive chart review his A1c has been below diabetic range since 2010 and has been off his diabetic medications since 06/2016. Disposition and follow-up:   Herbert Marquez was discharged from [facility] in Stable condition.  At the hospital follow up visit please address:  1.  TIA- reasess neurologic status for no recurrent symptoms    2.  Labs / imaging needed at time of follow-up: none  3.  Pending labs/ test needing follow-up: none  Herbert Marquez was transferred from [facility] to [place] 10/07/17-10/12/17 Herbert Marquez will start home health this week.  Herbert Marquez continues to perform home strengthening program: 20-30 mins/day Herbert Marquez reports good appetite and denies CP/dyspnea/dizziness/palpitations.  Herbert Marquez is followed by Neurology/Dr. Rosalin Hawking- has f/u 10/21/17 Herbert Marquez is followed by Cards/Dr. Carlota Raspberry f/u 11/12/17  Reviewed all notes, labs, imaging from recent hospitalization. Wife at [facility] during [place]  Patient Care Team    Relationship Specialty Notifications Start End  Mina Marble D, NP PCP - General Family Medicine  03/20/16   Nahser, Wonda Cheng, MD Consulting Physician Cardiology  03/20/16   Rod Can, MD Consulting Physician  Orthopedic Surgery  03/20/16   Jarome Matin, MD Consulting Physician Dermatology  03/20/16   Nickie Retort, MD Consulting Physician Urology  03/20/16   Shon Hough, MD Consulting Physician Ophthalmology  03/20/16     Patient Active Problem List   Diagnosis Date Noted  . Low blood pressure reading 10/14/2017  . Cerebral thrombosis with cerebral infarction 10/05/2017  . Expressive aphasia 10/04/2017  . TIA (transient ischemic attack)   . Presence of permanent cardiac pacemaker   . Hypertension   . Hyperlipidemia   . Coronary artery disease   . Basal cell carcinoma   . Arthritis   . Pressure injury of skin 01/08/2017  . Stroke ([place]) 01/07/2017  . Ischemic stroke ([place])   . Second degree Mobitz II AV block 10/23/2016  . Seizures ([place]) 10/09/2016  . Healthcare maintenance 10/01/2016  . Restlessness and agitation 07/02/2016  . Vascular dementia with behavior disturbance 06/29/2016  . Hemiparesis and other late effects of cerebrovascular accident ([place]) 06/29/2016  . Embolic stroke ([place]) 00/71/2197  . Right hemiparesis ([place])   . History of CVA with residual deficit   . Chronic anticoagulation   . Atrial fibrillation with rapid ventricular response ([place])   . Seizure prophylaxis   . Diastolic dysfunction   . Bacteremia due to Gram-positive bacteria   . Altered mental status   . Dementia without behavioral disturbance   . Leukocytosis   . Acute blood loss anemia   . Acute encephalopathy 06/04/2016  . PAF (paroxysmal atrial fibrillation) ([place])   . Iron deficiency anemia 04/04/2016  . History of stroke 04/04/2016  . Cerebral  hemorrhage ([place]) w/ SDH s/p IV tPA   . Coronary artery disease involving native coronary artery of native heart without angina pectoris   . Orthostatic hypotension   . History of right hip replacement   . Acute ischemic stroke ([place]) - L temporal lobe s/p tPA 03/30/2016  . BPH (benign prostatic hyperplasia) 11/25/2015  . GERD (gastroesophageal reflux  disease) 11/25/2015  . CKD (chronic kidney disease), stage II 11/25/2015  . Overactive bladder   . Cerebrovascular accident (CVA) ([place]) 09/18/2015  . Gait disturbance, post-stroke 06/23/2015  . 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 Thalamic infarction ([place]) 06/21/2015  . Benign essential HTN   . Type 2 diabetes mellitus with complication, without long-term current use of insulin ([place])   . Dysphagia as late effect of cerebrovascular disease   . Hyponatremia   . HLD (hyperlipidemia)   . COPD (chronic obstructive pulmonary disease) ([place]) 03/09/2011     Past Medical History:  Diagnosis Date  . Arthritis    "pretty much all over"   . Basal cell carcinoma    "several burned off his body, face, head"  . BPH (benign prostatic hypertrophy)   . Coronary artery disease    a. s/p PCI of RCA in 2006  . CVA (cerebral infarction)    a. 06/2015: left thalamic and bilateral PCA  . GERD (gastroesophageal reflux disease)   . Hyperlipidemia   . Hypertension   . Presence of permanent cardiac pacemaker   . Seizures ([place])   . Stroke ([place])   . TIA (transient ischemic attack)    Approximately 6 weeks post-cardiac catheterization.      Past Surgical History:  Procedure Laterality Date  . CARDIOVASCULAR STRESS TEST  07/01/2007   EF 74%  . CATARACT EXTRACTION, BILATERAL    . CORONARY ANGIOPLASTY WITH STENT PLACEMENT  10/2004   stenting x 2 to RCA  . FEMUR IM NAIL Right 11/26/2015   Procedure: INTRAMEDULLARY RIGHT (IM) NAIL FEMORAL;  Surgeon: Rod Can, MD;  Location: [facility];  Service: Orthopedics;  Laterality: Right;  . FRACTURE SURGERY    . HERNIA REPAIR    . HIP ARTHROPLASTY  03/09/2011   Procedure: ARTHROPLASTY BIPOLAR HIP;  Surgeon: Mauri Pole;  Location: [facility];  Service: Orthopedics;  Laterality: Left;  . INSERT / REPLACE / REMOVE PACEMAKER  10/23/2016  . LAPAROSCOPIC INCISIONAL / UMBILICAL / VENTRAL HERNIA REPAIR     "below his naval"  . PACEMAKER IMPLANT N/A 10/23/2016    Procedure: Pacemaker Implant;  Surgeon: Thompson Grayer, MD;  Location: [place] [facility];  Service: Cardiovascular;  Laterality: N/A;     Family History  Problem Relation Age of Onset  . Hypertension Mother   . Lung cancer Father   . Lung cancer Brother      Social History   Substance and Sexual Activity  Drug Use No     Social History   Substance and Sexual Activity  Alcohol Use Yes  . Alcohol/week: 8.0 standard drinks  . Types: 7 Glasses of wine, 1 Cans of beer per week   Comment: 1/2 BEER AND 1 WINE     Social History   Tobacco Use  Smoking Status Former Smoker  . Last attempt to quit: 03/06/1971  . Years since quitting: 46.6  Smokeless Tobacco Never Used     Outpatient Encounter Medications as of 10/14/2017  Medication Sig  . acetaminophen (TYLENOL) 325 MG tablet Take 2 tablets (650 mg total) every 4 (four) hours as needed  by mouth for mild pain (or temp > 37.5 C (99.5 F)).  [name] apixaban (ELIQUIS) 5 MG TABS tablet Take 1 tablet (5 mg total) by mouth 2 (two) times daily. Resume 10/25/16 evening dose  . aspirin EC 81 MG tablet Take 1 tablet (81 mg total) by mouth daily.  [name] atorvastatin (LIPITOR) 80 MG tablet TAKE 1 TABLET BY MOUTH DAILY. PATIENT MUST HAVE OFFICE VISIT PRIOR TO ANY FURTHER REFILLS  . ENSURE (ENSURE) Take 237 mLs by mouth daily as needed (nutrition).  [name] levETIRAcetam (KEPPRA) 750 MG tablet Take 1 tablet (750 mg total) by mouth 2 (two) times daily.  . Multiple Vitamin (MULTIVITAMIN WITH MINERALS) TABS tablet Take 1 tablet by mouth daily.  . nitroGLYCERIN (NITROSTAT) 0.4 MG SL tablet Place 1 tablet (0.4 mg total) under the tongue every 5 (five) minutes as needed for chest pain.  . phenazopyridine (PYRIDIUM) 95 MG tablet Take 95 mg by mouth 3 (three) times daily as needed for pain.  . phenylephrine-shark liver oil-mineral oil-petrolatum (PREPARATION H) 0.25-3-14-71.9 % rectal ointment Place 1 application rectally as needed for hemorrhoids.  . polyvinyl  alcohol (ARTIFICIAL TEARS) 1.4 % ophthalmic solution Place 1 drop into both eyes daily as needed for dry eyes.  [name] QUEtiapine (SEROQUEL) 25 MG tablet Take 1 tablet (25 mg total) by mouth at bedtime. PATIENT MUST HAVE OFFICE VISIT PRIOR TO ANY FURTHER REFILLS  . ranitidine (ZANTAC) 150 MG tablet Take 150 mg by mouth as needed for heartburn.  . [DISCONTINUED] mirabegron ER (MYRBETRIQ) 25 MG TB24 tablet Take 25 mg by mouth daily.   No facility-administered encounter medications on file as of 10/14/2017.     Allergies: Patient has no known allergies.  There is no height or weight on file to calculate BMI.  Blood pressure 98/63, pulse 88, SpO2 95 %.  Review of Systems  Constitutional: Positive for fatigue. Negative for activity change, appetite change, chills, diaphoresis, fever and unexpected weight change.  HENT: Positive for hearing loss.   Eyes: Negative for visual disturbance.  Respiratory: Negative for cough, chest tightness, shortness of breath, wheezing and stridor.   Cardiovascular: Negative for chest pain, palpitations and leg swelling.  Gastrointestinal: Negative for abdominal distention, abdominal pain, blood in stool, constipation, diarrhea, nausea and vomiting.  Musculoskeletal: Positive for arthralgias, back pain, gait problem, joint swelling, myalgias, neck pain and neck stiffness.  Skin: Negative for color change, pallor, rash and wound.  Neurological: Negative for dizziness and headaches.  Hematological: Bruises/bleeds easily.  Psychiatric/Behavioral: Negative for confusion, decreased concentration, dysphoric mood, hallucinations, self-injury, sleep disturbance and suicidal ideas. The patient is not nervous/anxious and is not hyperactive.        Objective:   Physical Exam  Constitutional: Herbert Marquez is oriented to person, place, and time. Herbert Marquez appears well-developed and well-nourished. No distress.  HENT:  Head: Normocephalic and atraumatic.  Right Ear: External ear normal.  Decreased hearing is noted.  Left Ear: External ear normal. Decreased hearing is noted.  Nose: Nose normal.  Mouth/Throat: Oropharynx is clear and moist.  Eyes: Pupils are equal, round, and reactive to light. Conjunctivae and EOM are normal.  Cardiovascular: Normal rate, regular rhythm, normal heart sounds and intact distal pulses.  No murmur heard. Pulmonary/Chest: Effort normal and breath sounds normal. No stridor. No respiratory distress. Herbert Marquez has no wheezes. Herbert Marquez has no rales. Herbert Marquez exhibits no tenderness.  Musculoskeletal: Herbert Marquez exhibits edema.       Right knee: Herbert Marquez exhibits swelling.  Neurological: Herbert Marquez is alert and oriented to person, place,  and time.  Skin: Skin is warm and dry. Capillary refill takes less than 2 seconds. No rash noted. Herbert Marquez is not diaphoretic. No erythema. No pallor.  Psychiatric: Herbert Marquez has a normal mood and affect. His behavior is normal. Judgment and thought content normal.      Assessment & Plan:   1. TIA (transient ischemic attack)   2. Healthcare maintenance   3. Low blood pressure reading     TIA (transient ischemic attack) No labs or imaging required No change to current medication regime. Continue with home health as directed. Recommend keeping at least two urinals at bedside. Recommend that you call your Cardiologist and share that your systolic blood pressure has been running <100 Please keep your Neurology appt- 10/21/17    Healthcare maintenance Follow-up here in 3 months.  Low blood pressure reading BP 97/66, HR 89 Recheck 98/63, HR 88 Not on antihypertensives Herbert Marquez denies dizziness/palpitations Recommend that you call your Cardiologist and share that your systolic blood pressure has been running <100  Pt was in the office today for 25+ minutes, I spent over 50% of  time in face to face counseling of patient's various medical conditions and in coordination of care  FOLLOW-UP:  Return in about 3 months (around 01/14/2018) for Regular Follow Up.

## 2017-10-14 NOTE — Assessment & Plan Note (Signed)
Follow up here in 3 months

## 2017-10-14 NOTE — Assessment & Plan Note (Addendum)
BP 97/66, HR 89 Recheck 98/63, HR 88 Not on antihypertensives He denies dizziness/palpitations Recommend that you call your Cardiologist and share that your systolic blood pressure has been running <100

## 2017-10-15 ENCOUNTER — Telehealth: Payer: Self-pay | Admitting: Adult Health

## 2017-10-15 NOTE — Telephone Encounter (Signed)
[name] from [facility] called to request Verbal Authorization for several things:   Once weekly for 5 weeks (home health & education aide to patient since discharge from nursing facility)  Please call  [name] / her at [phone].  --Forwarding message to medical assistant.  --Dion Body

## 2017-10-15 NOTE — Telephone Encounter (Signed)
Authorization provided.  Charyl Bigger, CMA

## 2017-10-21 ENCOUNTER — Ambulatory Visit (INDEPENDENT_AMBULATORY_CARE_PROVIDER_SITE_OTHER): Payer: Medicare Other | Admitting: Adult Health

## 2017-10-21 ENCOUNTER — Encounter: Payer: Self-pay | Admitting: Adult Health

## 2017-10-21 VITALS — BP 133/86 | HR 71 | Ht 67.0 in | Wt 156.4 lb

## 2017-10-21 DIAGNOSIS — I480 Paroxysmal atrial fibrillation: Secondary | ICD-10-CM

## 2017-10-21 DIAGNOSIS — I10 Essential (primary) hypertension: Secondary | ICD-10-CM

## 2017-10-21 DIAGNOSIS — E785 Hyperlipidemia, unspecified: Secondary | ICD-10-CM

## 2017-10-21 DIAGNOSIS — R569 Unspecified convulsions: Secondary | ICD-10-CM

## 2017-10-21 DIAGNOSIS — G459 Transient cerebral ischemic attack, unspecified: Secondary | ICD-10-CM | POA: Diagnosis not present

## 2017-10-21 DIAGNOSIS — Z8673 Personal history of transient ischemic attack (TIA), and cerebral infarction without residual deficits: Secondary | ICD-10-CM | POA: Diagnosis not present

## 2017-10-21 NOTE — Patient Instructions (Addendum)
Continue Eliquis (apixaban) daily  and lipitor  for secondary stroke prevention  Continue to follow up with PCP regarding cholesterol and blood pressure management   Continue to follow up with cardiologist regarding atrial fibrillation and eliquis management  Continue to monitor blood pressure at home  Continue home exercises for continued weakness  Maintain strict control of hypertension with blood pressure goal below 130/90, diabetes with hemoglobin A1c goal below 6.5% and cholesterol with LDL cholesterol (bad cholesterol) goal below 70 mg/dL. I also advised the patient to eat a healthy diet with plenty of whole grains, cereals, fruits and vegetables, exercise regularly and maintain ideal body weight.  Followup in the future with me in 6 months or call earlier if needed       Thank you for coming to see [place] at [facility]. I hope we have been able to provide you high quality care today.  You may receive a patient satisfaction survey over the next few weeks. We would appreciate your feedback and comments so that we may continue to improve ourselves and the health of our patients.

## 2017-10-21 NOTE — Progress Notes (Signed)
STROKE NEUROLOGY FOLLOW UP NOTE  NAME: Herbert Marquez DOB: Sep 28, 1927  REASON FOR VISIT: stroke follow up HISTORY FROM: pt and wife and chart  Chief Complaint  Patient presents with  . Follow-up    Stroke and Seizure follow up patient with wife Herbert Marquez, pt was in hospital in 10/2017 for possible TIA     History Summary Herbert C Jonesis a 84 y.o.malewith history of DM, HTN, HLD, previous stroke in 06/2015 at b/l thalami and right mesial temporal lobe due to left PCA occlusion and right PCA high grade stenosis, afib on 11/28/2015 post right hip fracture surgery on eliquis but later discontinued at follow up clinic and put on DAPT, and CAD s/p PCI admitted on 03/30/16 for speechdifficulties and aphasia. Hereceived IV t-PA. MRI showed left temproal infarct with hemorrhagic conversion and SDH. CTA head and neck 60-70% proximal right ICA and 40-50% left ICA stenosis. EF 65-70% in 11/2015. LDL 38 and A1C 5.0. ASA started 7 days post stroke due to hemorrhagic conversion and eliquis started on 04/13/16. lipitor down from 80mg  to 40mg . He was discharged to CIR.   06/04/16 readmission - pt admitted for acute onset confusion and agitation. MRI punctate right hippocampal infarct likely acute, and old left temporal lobe hemorrhagic infarct. His spell on admission most suggestive of seizure was started on Keppra  at 500 mg twice daily. EEG moderatediffuse slowing. His eliquis and lipitor continued and discharged to CIR.  07/25/16 follow up (CM) - Patient returns to the stroke clinic today for follow-up he has not had further stroke TIA symptoms,  he remains on eliquis for secondary stroke prevention and atrial fibrillation. He has not had any further seizure activity and remains on Keppra 500 twice daily. He is on Lipitor for hyperlipidemia without complaints of muscle aches. His Seroquel controls his agitation according to the wife and he sleeps well at night. Appetite is reportedly good. His therapies  have concluded. [facility] He has no new neurologic findings.  10/09/16 follow up - the patient has been doing well. He finished home PT/OT and now on outpt PT/OT. Still use wheelchair most time but able to go to bathroom on walker. Bp stable at home 110/70s. No bleeding while on eliquis. Still has mild right hemiparesis. BP today 107/68. Continued on keppra. seroquel works well with him.   01/07/17 admission -admitted for agitation and not able to speak.  Wife denies any staring spells or seizure-like activity.  He needed 1 mg Ativan and 10 mg Haldol for sedation but still quite agitated at that time in the ER.  CT head showed no bleeding.  CTA head and neck showed occlusion of right VA V3 segment, 55% stenosis of proximal right ICA.  MRI brain showed no acute infarct.  EF 55%.  EEG no seizure activity but moderate diffuse slowing.  LDL 62 and A1c 5.7.  Concerning the episode more likely seizure episode, less likely stroke.  Continued on aspirin 81 and an Eliquis as well as Lipitor.  Also increase Keppra to 750 twice daily.  04/23/2017 visit Herbert Marquez: During the interval time, patient has been doing well.  No recurrent stroke or seizure-like activity.  Continued on aspirin 81, Eliquis, Lipitor, and Keppra 750 twice daily without side effects.  BP today 129/79.  Patient continue follow-up with Dr. Read Drivers, and currently on outpatient PT/OT.  Came in with wheelchair, but able to walk with walker at home.  Seroquel at night  for agitation and sundowning, wife stated that it helps a lot.  Interval history 10/21/2017: Patient is being seen today for follow-up appointment and is accompanied by his wife.  Since previous follow-up visit, patient was seen in the ED on 10/04/2017 with expressive aphasia and this was determined to be likely related to TIA.  All imaging negative and symptoms completely resolved in less than 24 hours.  Patient was discharged to SNF and was discharged home on  10/12/2017 with home health.  He states since his recent admission, patient has not had any TIA/stroke symptoms or seizure activity.  He has completed all therapies but continues to do home exercises daily for approximately 30 minutes a day.  He currently is sitting in wheelchair but is able to ambulate short distances with rolling walker.  Denies any recent falls.  Continues to take Eliquis for atrial fibrillation and and aspirin 81 mg secondary stroke prevention without side effects of bleeding or bruising.  Continues to take Lipitor without side effects myalgias.  Patient continues to take Keppra without side effects or recent seizure activity.  Blood pressure today satisfactory 133/86.  He continues to live at home with his wife where they are able to maintain all ADLs and IADLs but does have a home nurse come 1 time per week for vital check and basic assessment.  He also continues to see Dr. Beatriz Stallion for right knee injection.  Denies new or worsening stroke/TIA symptoms since recent hospital admission.     REVIEW OF SYSTEMS: Full 14 system review of systems performed and notable only for those listed below and in HPI above, all others are negative:  Fatigue, hearing loss, shortness of breath, cold intolerance, joint pain, walking difficulty and dizziness  The following represents the patient's updated allergies and side effects list: No Known Allergies  The neurologically relevant items on the patient's problem list were reviewed on today's visit.  Neurologic Examination  A problem focused neurological exam (12 or more points of the single system neurologic examination, vital signs counts as 1 point, cranial nerves count for 8 points) was performed.  Blood pressure 133/86, pulse 71, height 5\' 7"  (1.702 m), weight 156 lb 6.4 oz (70.9 kg).  General - Well nourished, well developed, pleasant elderly Caucasian male, in no apparent distress.  Ophthalmologic - Fundi not visualized due to eye  movement.  Cardiovascular - irregularly irregular heart rate and rhythm.  Mental Status -  Level of arousal and orientation to time, place, and person were intact. Language including expression, naming, repetition, comprehension was assessed and found intact. Fund of Knowledge was assessed and was intact.  Cranial Nerves II - XII - II - Visual field intact OU. III, IV, VI - Extraocular movements intact. V - Facial sensation intact bilaterally. VII - Facial movement intact bilaterally. VIII - Hearing & vestibular intact bilaterally. X - Palate elevates symmetrically. XI - Chin turning & shoulder shrug intact bilaterally. XII - Tongue protrusion intact.  Motor Strength - The patient's strength was normal in LUE and LLE, however, RUE right hand mild dexterity difficulty, RLE 4-/5.  Bulk was normal and fasciculations were absent.   Motor Tone - Muscle tone was assessed at the neck and appendages and was normal.  Reflexes - The patient's reflexes were 1+ in all extremities and he had no pathological reflexes.  Sensory - Light touch, temperature/pinprick were assessed and were normal.    Coordination - The patient had right FTN mild dysmetria.  Tremor was absent.  Gait  and Station - not tested, in wheelchair.   Data reviewed: I personally reviewed the images and agree with the radiology interpretations.  CT head without contrast 10/04/2017 IMPRESSION: 1. No acute intracranial abnormality or significant interval change. 2. Remote lacunar infarct of the left thalamus. 3. Stable chronic advanced atrophy and white matter disease. This likely reflects the sequela of chronic microvascular ischemia. 4. ASPECTS is 10/10  CTA head/neck with and without contrast 10/04/2017 IMPRESSION: 1. CT perfusion suggests delayed perfusion in the right posterior cerebellum without core infarct. The distal right vertebral artery is chronically occluded in there is collateral flow to the right PICA  which may contribute to delayed perfusion of this area. MRI would be necessary to determine if acute infarct is present. 2. Atherosclerotic calcification of the carotid bifurcation bilaterally and in the cavernous carotid bilaterally without flow limiting stenosis. 3. Negative for emergent large vessel intracranial occlusion.  MR brain without contrast 10/05/2017 IMPRESSION: 1. No acute intracranial abnormality. 2. Moderate chronic small vessel ischemic disease and cerebral atrophy.    Assessment: Langston Tuberville is a 84 year old male with left temporal infarct with hemorrhagic conversion and SDH on 03/30/2016 and right hippocampal infarct on 06/04/2016 which was felt to be an incidental finding due to symptoms and it was determined that the symptoms were likely more related to seizure activity and was started on Keppra at that time along with admission on 01/07/2017 again for likely seizure activity.  Vascular risk factors include DM, HTN, HLD, PAF on AC, previous infarcts, right PCA high-grade stenosis and seizures.  Patient is being seen today for follow-up appointment after recent hospitalization on 10/04/2017 with diagnosis of TIA.  Patient is currently back home denies additional stroke/TIA symptoms or seizure activity.     Plan:  - continue ASA 81mg  and eliquis and lipitor for stroke prevention -Continue Keppra 750 twice daily for seizure phylaxis -f/u with cardiologist as scheduled for PAF and AC management - continue self exercise for continued RLE weakness - Follow up with your primary care physician for stroke risk factor modification. Recommend maintain blood pressure goal <130/80, diabetes with hemoglobin A1c goal below 7.0% and lipids with LDL cholesterol goal below 70 mg/dL.  - check BP at home and avoid hypotension  - follow up with Dr. Letta Pate and Dr. Rayann Heman - follow up in 6 months  I spent more than 25 minutes of face to face time with the patient. Greater than 50% of time  was spent in counseling and coordination of care. We discussed about avoiding falls, continue current medications, and continue PT/OT.   Venancio Poisson, AGNP-BC  [facility] [address] [place], [place] [address]  Phone [phone] Fax [phone] Note: This document was prepared with digital dictation and possible smart phrase technology. Any transcriptional errors that result from this process are unintentional.

## 2017-10-22 NOTE — Progress Notes (Signed)
I agree with the above plan 

## 2017-10-30 ENCOUNTER — Telehealth: Payer: Self-pay | Admitting: Adult Health

## 2017-10-30 ENCOUNTER — Other Ambulatory Visit: Payer: Self-pay | Admitting: *Deleted

## 2017-10-30 MED ORDER — APIXABAN 5 MG PO TABS: 5.0000 mg | ORAL_TABLET | Freq: Two times a day (BID) | ORAL | 2 refills | Status: DC

## 2017-11-12 ENCOUNTER — Encounter: Payer: Self-pay | Admitting: Cardiovascular Disease

## 2017-11-12 ENCOUNTER — Ambulatory Visit (INDEPENDENT_AMBULATORY_CARE_PROVIDER_SITE_OTHER): Payer: Medicare Other | Admitting: Cardiovascular Disease

## 2017-11-12 VITALS — BP 150/86 | HR 61 | Ht 67.0 in | Wt 160.0 lb

## 2017-11-12 DIAGNOSIS — I10 Essential (primary) hypertension: Secondary | ICD-10-CM | POA: Diagnosis not present

## 2017-11-12 DIAGNOSIS — I2510 Atherosclerotic heart disease of native coronary artery without angina pectoris: Secondary | ICD-10-CM | POA: Diagnosis not present

## 2017-11-12 NOTE — Patient Instructions (Signed)

## 2017-11-12 NOTE — Progress Notes (Signed)
Cardiology Office Note   Date:  11/12/2017   ID:  Herbert Marquez, DOB January 04, 1928, MRN [id number]  PCP:  [name], NP  Cardiologist:   [name], MD   Chief Complaint  Patient presents with  . Coronary Artery Disease  . Atrial Fibrillation   Problem list:  1. Coronary artery disease-status post PTCA and stenting of the RCA in [place], [place]  2. Dyslipidemia  3. Hypertension  4. Diabetes mellitus  5. Hernia repair   Prior Notes:   Pt has done well from a cardiac standpoint. He fell and broke his left hip in January . His is recovering well. He is still walking with a cane. He had no complications during or after surgery.  He had a severe UTI in June / July of 2013. He has been seen by Urology and was started on Tamulosin for BPH.  He's not had any episodes of chest pain or shortness breath. He still walks with a cane but is able to get out and stays fairly active.  October 02, 2012:  Herbert Marquez is doing well. No CP , no dyspnea. He remains active - went to the beach last week. He walks at [facility] when he is down there. Goes to the [facility] 3 times a week. No angina.  Jan. 30, 2015:  Herbert Marquez is doing well. He has not had any cardiac problems. He has been to the beach recently.  He goes to the [facility] 2-3 times a week. He doesn't have any angina. His primary medical doctor is managing his lipids.    Feb. 18, 2016:   Herbert Marquez is a 84 y.o. male who presents for follow up of his CAD. NO CP or dyspnea.    Limited by leg and back pains.  He and his wife celebrated their 59th wedding anniversary.   They went to [facility] last week.    October 25, 2014:  Doing well from a cardiac standpoint..  Has a circk in his neck Staying buys,   Still has his place at [facility].   May 10, 2015: Doing great. Some DOE  Still going down to [facility]  No CP, Wants refill on NTG - lost his bottle  May 17, 2016:   Has had a rough year since I last saw him  Has  had 2 strokes since I last saw him  Has severe weakness of his right side now ( 1st stroke)  The 2nd stroke affected his memory - has improved.  Was found to have PAF during his hospitalization in Feb.  Was on plavix,  Has been changed to [facility]   August 20, 2016:  Herbert Marquez is seen today  Had another small stroke since Iast saw him in March  No cardiac issues  March 12, 2017:  Herbert Marquez is seen today for follow-up visit. He is examined in the wheelchair. Had a pacer placed in Aug , 2018. Had a seizure in Nov. 2018.   Increased his Keppra  Feels better.   November 12, 2017:  Herbert Marquez is seen today. Examined in the wheelchair Had a TIA last month.  Was in the hospital for 4-5 days  BP has been good.  Is a bit elevated here in the office  In the hospital and at the [place] home, his Bp was normal .   Now that he is eating at home ( a bit more salty) BP has incresed.  Advised him  to decrease his salt intake   Past Medical History:  Diagnosis Date  . Acute blood loss anemia   . Acute encephalopathy 06/04/2016  . Acute ischemic stroke ([place]) - L temporal lobe s/p tPA 03/30/2016  . Altered mental status   . Arthritis    "pretty much all over"   . 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 Atrial fibrillation with rapid ventricular response ([place])   . Basal cell carcinoma    "several burned off his body, face, head"  . Benign essential HTN   . BPH (benign prostatic hypertrophy)   . Cerebral hemorrhage (HCC) w/ SDH s/p IV tPA   . Cerebral thrombosis with cerebral infarction 10/05/2017  . Cerebrovascular accident (CVA) ([place]) 09/18/2015  . Chronic anticoagulation   . CKD (chronic kidney disease), stage II 11/25/2015  . COPD (chronic obstructive pulmonary disease) ([place]) 03/09/2011  . Coronary artery disease    a. s/p PCI of RCA in 2006  . Coronary artery disease involving native coronary artery of native heart without angina pectoris   . CVA (cerebral infarction)    a. 06/2015:  left thalamic and bilateral PCA  . Dementia without behavioral disturbance   . Dysphagia as late effect of cerebrovascular disease   . Embolic stroke ([place]) 7/0/3500  . Expressive aphasia 10/04/2017  . Gait disturbance, post-stroke 06/23/2015  . GERD (gastroesophageal reflux disease)   . Hemiparesis and other late effects of cerebrovascular accident ([place]) 06/29/2016  . History of stroke 04/04/2016  . HLD (hyperlipidemia)   . Hyperlipidemia   . Hypertension   . Ischemic stroke ([place])   . Low blood pressure reading 10/14/2017  . Orthostatic hypotension   . PAF (paroxysmal atrial fibrillation) ([place])   . Presence of permanent cardiac pacemaker   . Right hemiparesis ([place])   . Second degree Mobitz II AV block 10/23/2016  . Seizures ([place])   . Stroke ([place])   . Thalamic infarction ([place]) 06/21/2015  . TIA (transient ischemic attack)    Approximately 6 weeks post-cardiac catheterization.   . Type 2 diabetes mellitus with complication, without long-term current use of insulin ([place])   . Vascular dementia with behavior disturbance 06/29/2016    Past Surgical History:  Procedure Laterality Date  . CARDIOVASCULAR STRESS TEST  07/01/2007   EF 74%  . CATARACT EXTRACTION, BILATERAL    . CORONARY ANGIOPLASTY WITH STENT PLACEMENT  10/2004   stenting x 2 to RCA  . FEMUR IM NAIL Right 11/26/2015   Procedure: INTRAMEDULLARY RIGHT (IM) NAIL FEMORAL;  Surgeon: Rod Can, MD;  Location: [facility];  Service: Orthopedics;  Laterality: Right;  . FRACTURE SURGERY    . HERNIA REPAIR    . HIP ARTHROPLASTY  03/09/2011   Procedure: ARTHROPLASTY BIPOLAR HIP;  Surgeon: Mauri Pole;  Location: [facility];  Service: Orthopedics;  Laterality: Left;  . INSERT / REPLACE / REMOVE PACEMAKER  10/23/2016  . LAPAROSCOPIC INCISIONAL / UMBILICAL / VENTRAL HERNIA REPAIR     "below his naval"  . PACEMAKER IMPLANT N/A 10/23/2016   Procedure: Pacemaker Implant;  Surgeon: Thompson Grayer, MD;  Location: [facility];  Service:  Cardiovascular;  Laterality: N/A;     Current Outpatient Medications  Medication Sig Dispense Refill  . acetaminophen (TYLENOL) 325 MG tablet Take 2 tablets (650 mg total) every 4 (four) hours as needed by mouth for mild pain (or temp > 37.5 C (99.5 F)). 30 tablet 0  . apixaban (ELIQUIS) 5 MG TABS tablet Take 1 tablet (5  mg total) by mouth 2 (two) times daily. 180 tablet 2  . aspirin EC 81 MG tablet Take 1 tablet (81 mg total) by mouth daily.    [name] atorvastatin (LIPITOR) 80 MG tablet Take 80 mg by mouth daily.    [name] ENSURE (ENSURE) Take 237 mLs by mouth daily as needed (nutrition).    [name] levETIRAcetam (KEPPRA) 750 MG tablet Take 1 tablet (750 mg total) by mouth 2 (two) times daily. 180 tablet 1  . Multiple Vitamin (MULTIVITAMIN WITH MINERALS) TABS tablet Take 1 tablet by mouth daily.    . nitroGLYCERIN (NITROSTAT) 0.4 MG SL tablet Place 1 tablet (0.4 mg total) under the tongue every 5 (five) minutes as needed for chest pain. 25 tablet 5  . phenylephrine-shark liver oil-mineral oil-petrolatum (PREPARATION H) 0.25-3-14-71.9 % rectal ointment Place 1 application rectally as needed for hemorrhoids.    . polyvinyl alcohol (ARTIFICIAL TEARS) 1.4 % ophthalmic solution Place 1 drop into both eyes daily as needed for dry eyes.    [name] QUEtiapine (SEROQUEL) 25 MG tablet Take 25 mg by mouth at bedtime.    . ranitidine (ZANTAC) 150 MG tablet Take 150 mg by mouth as needed for heartburn.     No current facility-administered medications for this visit.     Allergies:   Patient has no known allergies.    Social History:  The patient  reports that he quit smoking about 46 years ago. He has never used smokeless tobacco. He reports that he drinks about 8.0 standard drinks of alcohol per week. He reports that he does not use drugs.   Family History:  The patient's family history includes Hypertension in his mother; Lung cancer in his brother and father.    ROS: Noted in current history.  All other systems are  negative.   Physical Exam: Blood pressure (!) 150/86, pulse 61, height 5\' 7"  (1.702 m), weight 160 lb (72.6 kg), SpO2 96 %.  GEN:  Chronically ill appearing man HEENT: Normal NECK: No JVD; No carotid bruits LYMPHATICS: No lymphadenopathy CARDIAC: RR, no murmurs, rubs, gallops RESPIRATORY:  Clear to auscultation without rales, wheezing or rhonchi  ABDOMEN: Soft, non-tender, non-distended MUSCULOSKELETAL:  No edema; No deformity  SKIN: Warm and dry NEUROLOGIC:  Alert and oriented x 3  EKG:     Recent Labs: 01/10/2017: Magnesium 1.9 10/04/2017: TSH 3.066 10/05/2017: ALT 18; BUN 12; Creatinine, Ser 1.10; Hemoglobin 13.1; Platelets 289; Potassium 4.2; Sodium 134    Lipid Panel    Component Value Date/Time   CHOL 94 10/05/2017 0516   CHOL 110 04/02/2017 0859   TRIG 53 10/05/2017 0516   HDL 31 (L) 10/05/2017 0516   HDL 37 (L) 04/02/2017 0859   CHOLHDL 3.0 10/05/2017 0516   VLDL 11 10/05/2017 0516   LDLCALC 52 10/05/2017 0516   LDLCALC 57 04/02/2017 0859      Wt Readings from Last 3 Encounters:  11/12/17 160 lb (72.6 kg)  10/21/17 156 lb 6.4 oz (70.9 kg)  10/05/17 164 lb 10.9 oz (74.7 kg)      Other studies Reviewed: Additional studies/ records that were reviewed today include: . Review of the above records demonstrates:    ASSESSMENT AND PLAN:  1. Coronary artery disease-status post PTCA and stenting of the RCA in [place], [place]  -   No angina   2. Dyslipidemia -   .   Lipids look great   3. Hypertension  -   BP in hospital was low Is a bit high  now.   I think he is eating more salt now that he is out of the hospital and SNF     4. Diabetes mellitus    5. Hernia repair   6. Paroxysmal atrial fib:      Now - s/p pacer   7.  CVAs:    Can walk with walker at home .     Current medicines are reviewed at length with the patient today.  The patient does not have concerns regarding medicines.  The following changes have been made:  no  change   Disposition:   FU with me in 6 months     Signed, [name], MD  11/12/2017 10:51 AM    [facility] [place], [place], [place]  [address] Phone: [phone]; Fax: [phone]

## 2017-11-14 DIAGNOSIS — H903 Sensorineural hearing loss, bilateral: Secondary | ICD-10-CM | POA: Diagnosis not present

## 2017-11-14 DIAGNOSIS — H6122 Impacted cerumen, left ear: Secondary | ICD-10-CM | POA: Diagnosis not present

## 2017-11-16 ENCOUNTER — Other Ambulatory Visit: Payer: Self-pay | Admitting: Adult Health

## 2017-11-18 ENCOUNTER — Encounter: Payer: Medicare Other | Attending: Physical Medicine & Rehabilitation

## 2017-11-18 ENCOUNTER — Ambulatory Visit (HOSPITAL_BASED_OUTPATIENT_CLINIC_OR_DEPARTMENT_OTHER): Payer: Medicare Other | Admitting: Physical Medicine & Rehabilitation

## 2017-11-18 ENCOUNTER — Encounter: Payer: Self-pay | Admitting: Physical Medicine & Rehabilitation

## 2017-11-18 VITALS — BP 154/88 | HR 73 | Ht 67.0 in | Wt 160.0 lb

## 2017-11-18 DIAGNOSIS — M1711 Unilateral primary osteoarthritis, right knee: Secondary | ICD-10-CM | POA: Diagnosis not present

## 2017-11-18 NOTE — Progress Notes (Signed)
Knee injection   Indication:Right Knee pain not relieved by medication management and other conservative care.  Informed consent was obtained after describing risks and benefits of the procedure with the patient, this includes bleeding, bruising, infection and medication side effects. The patient wishes to proceed and has given written consent. The patient was placed in a recumbent position. The medial aspect of the knee was marked and prepped with Betadine and alcohol. It was then entered with a 25-gauge 1-1/2 inch needle was inserted into the knee joint. After negative draw back for blood, a solution containing one ML of 6mg  per mL betamethasone and 3 mL of 1% lidocaine were injected. The patient tolerated the procedure well. Post procedure instructions were given.  Would recommend Zilretta for next injection in ~59mo (usually gets 65mo relief with celestone)

## 2017-11-19 ENCOUNTER — Telehealth: Payer: Self-pay | Admitting: Adult Health

## 2017-11-19 ENCOUNTER — Other Ambulatory Visit: Payer: Self-pay

## 2017-11-19 MED ORDER — QUETIAPINE FUMARATE 25 MG PO TABS: 25.0000 mg | ORAL_TABLET | Freq: Every day | ORAL | 1 refills | Status: DC

## 2017-11-19 NOTE — Telephone Encounter (Signed)
Medication last filled by previous provider.  Sent request to Mina Marble, NP to review. MPulliam, CMA/RT(R)

## 2017-11-19 NOTE — Telephone Encounter (Signed)
PT's wife/ Clemmie Marxen states pt needs a refill & is completely out of Rx :  QUEtiapine (SEROQUEL) 25 MG tablet [[id number]]   Order Details  Dose: 25 mg Route: Oral Frequency: Daily at bedtime  Dispense Quantity: -- Refills: -- Fills remaining: --        Sig: Take 25 mg by mouth at bedtime.          Patient uses: [place], [place] [phone] (Phone) [phone] (Fax   --forwarding request to medical assistant.  --glh

## 2017-11-19 NOTE — Telephone Encounter (Signed)
Patient's wife called requesting refill on Seroquel 25 mg.  Medication was last filled by a pervious provider.  LOV 10/14/2017.  Please review and advise. MPulliam, CMA/RT(R)

## 2017-12-10 ENCOUNTER — Telehealth: Payer: Self-pay | Admitting: Cardiology

## 2017-12-10 ENCOUNTER — Ambulatory Visit (INDEPENDENT_AMBULATORY_CARE_PROVIDER_SITE_OTHER): Payer: Medicare Other | Admitting: *Deleted

## 2017-12-10 DIAGNOSIS — I4819 Other persistent atrial fibrillation: Secondary | ICD-10-CM

## 2017-12-10 DIAGNOSIS — I441 Atrioventricular block, second degree: Secondary | ICD-10-CM

## 2017-12-10 NOTE — Telephone Encounter (Signed)
Confirmed remote transmission w/ pt wife.   

## 2017-12-11 NOTE — Progress Notes (Signed)
Remote pacemaker transmission.   

## 2017-12-17 ENCOUNTER — Ambulatory Visit (INDEPENDENT_AMBULATORY_CARE_PROVIDER_SITE_OTHER): Payer: Medicare Other

## 2017-12-17 VITALS — BP 136/89 | HR 83

## 2017-12-17 DIAGNOSIS — Z23 Encounter for immunization: Secondary | ICD-10-CM | POA: Diagnosis not present

## 2017-12-18 ENCOUNTER — Encounter: Payer: Self-pay | Admitting: Cardiology

## 2018-01-15 NOTE — Progress Notes (Deleted)
Subjective:    Patient ID: Herbert Marquez, male    DOB: 1927/11/10, 84 y.o.   MRN: [id number]  HPI: 10/14/17 OV  Herbert Marquez presents for hospital f/u 10/04/2017-10/07/2017 1. Expressive aphasia most likely due to TIA: Herbert Marquez presented with SOB, confusion and expressive aphasia and a code stroke was called. He was not given tPA due to hemorrhagic conversion in 03/2016 after tPa was administered. His CT and MRI were negative for acute abnormalities. His symptoms completely resolved in less than 24 hours. He is being sent to a SNF. 2. Hypertension:122/80 this morning.Per neurology recommendations, permissive HTN for 24 hours. Per chart review, he is not on any antihypertensives, withwell controlled BP since 2015. 3. DM: Patient has a previous diagnosis of DM; however, per extensive chart review his A1c has been below diabetic range since 2010 and has been off his diabetic medications since 06/2016. Disposition and follow-up:   Herbert Marquez was discharged from [facility] in Stable condition.  At the hospital follow up visit please address:  1.  TIA- reasess neurologic status for no recurrent symptoms    2.  Labs / imaging needed at time of follow-up: none  3.  Pending labs/ test needing follow-up: none  He was transferred from [facility] to [place] 10/07/17-10/12/17 He will start home health this week.  He continues to perform home strengthening program: 20-30 mins/day He reports good appetite and denies CP/dyspnea/dizziness/palpitations.  He is followed by Neurology/Dr. Rosalin Hawking- has f/u 10/21/17 He is followed by Cards/Dr. Carlota Raspberry f/u 11/12/17  Reviewed all notes, labs, imaging from recent hospitalization. Wife at [facility] during [place]  01/16/18 OV: Herbert Marquez is here for f/u:   Patient Care Team    Relationship Specialty Notifications Start End  [name], NP PCP - General Family Medicine  03/20/16   Nahser, Wonda Cheng, MD PCP - Cardiology Cardiology  Admissions 11/12/17   Rod Can, MD Consulting Physician Orthopedic Surgery  03/20/16   Jarome Matin, MD Consulting Physician Dermatology  03/20/16   Nickie Retort, MD Consulting Physician Urology  03/20/16   Shon Hough, MD Consulting Physician Ophthalmology  03/20/16     Patient Active Problem List   Diagnosis Date Noted  . Low blood pressure reading 10/14/2017  . Cerebral thrombosis with cerebral infarction 10/05/2017  . Expressive aphasia 10/04/2017  . TIA (transient ischemic attack)   . Presence of permanent cardiac pacemaker   . Hypertension   . Hyperlipidemia   . Coronary artery disease   . Basal cell carcinoma   . Arthritis   . Pressure injury of skin 01/08/2017  . Stroke ([place]) 01/07/2017  . Ischemic stroke ([place])   . Second degree Mobitz II AV block 10/23/2016  . Seizures ([place]) 10/09/2016  . Healthcare maintenance 10/01/2016  . Restlessness and agitation 07/02/2016  . Vascular dementia with behavior disturbance ([place]) 06/29/2016  . Hemiparesis and other late effects of cerebrovascular accident ([place]) 06/29/2016  . Embolic stroke ([place]) 19/14/7829  . Right hemiparesis ([place])   . History of CVA with residual deficit   . Chronic anticoagulation   . Atrial fibrillation with rapid ventricular response ([place])   . Seizure prophylaxis   . Diastolic dysfunction   . Bacteremia due to Gram-positive bacteria   . Altered mental status   . Dementia without behavioral disturbance ([place])   . Leukocytosis   . Acute blood loss anemia   . Acute encephalopathy 06/04/2016  . PAF (paroxysmal atrial fibrillation) ([place])   .  Iron deficiency anemia 04/04/2016  . History of stroke 04/04/2016  . Cerebral hemorrhage (HCC) w/ SDH s/p IV tPA   . Coronary artery disease involving native coronary artery of native heart without angina pectoris   . Orthostatic hypotension   . History of right hip replacement   . Acute ischemic stroke ([place]) - L temporal lobe s/p tPA 03/30/2016  . BPH  (benign prostatic hyperplasia) 11/25/2015  . GERD (gastroesophageal reflux disease) 11/25/2015  . CKD (chronic kidney disease), stage II 11/25/2015  . Overactive bladder   . Cerebrovascular accident (CVA) ([place]) 09/18/2015  . Gait disturbance, post-stroke 06/23/2015  . 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 Thalamic infarction ([place]) 06/21/2015  . Benign essential HTN   . Type 2 diabetes mellitus with complication, without long-term current use of insulin ([place])   . Dysphagia as late effect of cerebrovascular disease   . Hyponatremia   . HLD (hyperlipidemia)   . COPD (chronic obstructive pulmonary disease) ([place]) 03/09/2011     Past Medical History:  Diagnosis Date  . Acute blood loss anemia   . Acute encephalopathy 06/04/2016  . Acute ischemic stroke ([place]) - L temporal lobe s/p tPA 03/30/2016  . Altered mental status   . Arthritis    "pretty much all over"   . 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 Atrial fibrillation with rapid ventricular response ([place])   . Basal cell carcinoma    "several burned off his body, face, head"  . Benign essential HTN   . BPH (benign prostatic hypertrophy)   . Cerebral hemorrhage (HCC) w/ SDH s/p IV tPA   . Cerebral thrombosis with cerebral infarction 10/05/2017  . Cerebrovascular accident (CVA) ([place]) 09/18/2015  . Chronic anticoagulation   . CKD (chronic kidney disease), stage II 11/25/2015  . COPD (chronic obstructive pulmonary disease) ([place]) 03/09/2011  . Coronary artery disease    a. s/p PCI of RCA in 2006  . Coronary artery disease involving native coronary artery of native heart without angina pectoris   . CVA (cerebral infarction)    a. 06/2015: left thalamic and bilateral PCA  . Dementia without behavioral disturbance ([place])   . Dysphagia as late effect of cerebrovascular disease   . Embolic stroke ([place]) 07/09/4330  . Expressive aphasia 10/04/2017  . Gait disturbance, post-stroke 06/23/2015  . GERD (gastroesophageal reflux disease)   .  Hemiparesis and other late effects of cerebrovascular accident ([place]) 06/29/2016  . History of stroke 04/04/2016  . HLD (hyperlipidemia)   . Hyperlipidemia   . Hypertension   . Ischemic stroke ([place])   . Low blood pressure reading 10/14/2017  . Orthostatic hypotension   . PAF (paroxysmal atrial fibrillation) ([place])   . Presence of permanent cardiac pacemaker   . Right hemiparesis ([place])   . Second degree Mobitz II AV block 10/23/2016  . Seizures ([place])   . Stroke ([place])   . Thalamic infarction ([place]) 06/21/2015  . TIA (transient ischemic attack)    Approximately 6 weeks post-cardiac catheterization.   . Type 2 diabetes mellitus with complication, without long-term current use of insulin ([place])   . Vascular dementia with behavior disturbance ([place]) 06/29/2016     Past Surgical History:  Procedure Laterality Date  . CARDIOVASCULAR STRESS TEST  07/01/2007   EF 74%  . CATARACT EXTRACTION, BILATERAL    . CORONARY ANGIOPLASTY WITH STENT PLACEMENT  10/2004   stenting x 2 to RCA  . FEMUR IM NAIL Right 11/26/2015   Procedure: INTRAMEDULLARY RIGHT (IM) NAIL  FEMORAL;  Surgeon: Rod Can, MD;  Location: [facility];  Service: Orthopedics;  Laterality: Right;  . FRACTURE SURGERY    . HERNIA REPAIR    . HIP ARTHROPLASTY  03/09/2011   Procedure: ARTHROPLASTY BIPOLAR HIP;  Surgeon: Mauri Pole;  Location: [facility];  Service: Orthopedics;  Laterality: Left;  . INSERT / REPLACE / REMOVE PACEMAKER  10/23/2016  . LAPAROSCOPIC INCISIONAL / UMBILICAL / VENTRAL HERNIA REPAIR     "below his naval"  . PACEMAKER IMPLANT N/A 10/23/2016   Procedure: Pacemaker Implant;  Surgeon: Thompson Grayer, MD;  Location: [facility];  Service: Cardiovascular;  Laterality: N/A;     Family History  Problem Relation Age of Onset  . Hypertension Mother   . Lung cancer Father   . Lung cancer Brother      Social History   Substance and Sexual Activity  Drug Use No     Social History   Substance and Sexual Activity   Alcohol Use Yes  . Alcohol/week: 8.0 standard drinks  . Types: 7 Glasses of wine, 1 Cans of beer per week   Comment: 1/2 BEER AND 1 WINE     Social History   Tobacco Use  Smoking Status Former Smoker  . Last attempt to quit: 03/06/1971  . Years since quitting: 46.8  Smokeless Tobacco Never Used     Outpatient Encounter Medications as of 01/16/2018  Medication Sig  . acetaminophen (TYLENOL) 325 MG tablet Take 2 tablets (650 mg total) every 4 (four) hours as needed by mouth for mild pain (or temp > 37.5 C (99.5 F)).  [name] apixaban (ELIQUIS) 5 MG TABS tablet Take 1 tablet (5 mg total) by mouth 2 (two) times daily.  [name] aspirin EC 81 MG tablet Take 1 tablet (81 mg total) by mouth daily.  [name] atorvastatin (LIPITOR) 80 MG tablet Take 80 mg by mouth daily.  [name] ENSURE (ENSURE) Take 237 mLs by mouth daily as needed (nutrition).  [name] levETIRAcetam (KEPPRA) 750 MG tablet Take 1 tablet (750 mg total) by mouth 2 (two) times daily.  . Multiple Vitamin (MULTIVITAMIN WITH MINERALS) TABS tablet Take 1 tablet by mouth daily.  . nitroGLYCERIN (NITROSTAT) 0.4 MG SL tablet Place 1 tablet (0.4 mg total) under the tongue every 5 (five) minutes as needed for chest pain.  . phenylephrine-shark liver oil-mineral oil-petrolatum (PREPARATION H) 0.25-3-14-71.9 % rectal ointment Place 1 application rectally as needed for hemorrhoids.  . polyvinyl alcohol (ARTIFICIAL TEARS) 1.4 % ophthalmic solution Place 1 drop into both eyes daily as needed for dry eyes.  [name] QUEtiapine (SEROQUEL) 25 MG tablet Take 1 tablet (25 mg total) by mouth at bedtime.  . ranitidine (ZANTAC) 150 MG tablet Take 150 mg by mouth as needed for heartburn.   No facility-administered encounter medications on file as of 01/16/2018.     Allergies: Patient has no known allergies.  There is no height or weight on file to calculate BMI.  There were no vitals taken for this visit.  Review of Systems  Constitutional: Positive for fatigue. Negative for  activity change, appetite change, chills, diaphoresis, fever and unexpected weight change.  HENT: Positive for hearing loss.   Eyes: Negative for visual disturbance.  Respiratory: Negative for cough, chest tightness, shortness of breath, wheezing and stridor.   Cardiovascular: Negative for chest pain, palpitations and leg swelling.  Gastrointestinal: Negative for abdominal distention, abdominal pain, blood in stool, constipation, diarrhea, nausea and vomiting.  Musculoskeletal: Positive for arthralgias, back pain, gait problem,  joint swelling, myalgias, neck pain and neck stiffness.  Skin: Negative for color change, pallor, rash and wound.  Neurological: Negative for dizziness and headaches.  Hematological: Bruises/bleeds easily.  Psychiatric/Behavioral: Negative for confusion, decreased concentration, dysphoric mood, hallucinations, self-injury, sleep disturbance and suicidal ideas. The patient is not nervous/anxious and is not hyperactive.        Objective:   Physical Exam  Constitutional: He is oriented to person, place, and time. He appears well-developed and well-nourished. No distress.  HENT:  Head: Normocephalic and atraumatic.  Right Ear: External ear normal. Decreased hearing is noted.  Left Ear: External ear normal. Decreased hearing is noted.  Nose: Nose normal.  Mouth/Throat: Oropharynx is clear and moist.  Eyes: Pupils are equal, round, and reactive to light. Conjunctivae and EOM are normal.  Cardiovascular: Normal rate, regular rhythm, normal heart sounds and intact distal pulses.  No murmur heard. Pulmonary/Chest: Effort normal and breath sounds normal. No stridor. No respiratory distress. He has no wheezes. He has no rales. He exhibits no tenderness.  Musculoskeletal: He exhibits edema.       Right knee: He exhibits swelling.  Neurological: He is alert and oriented to person, place, and time.  Skin: Skin is warm and dry. Capillary refill takes less than 2 seconds. No  rash noted. He is not diaphoretic. No erythema. No pallor.  Psychiatric: He has a normal mood and affect. His behavior is normal. Judgment and thought content normal.      Assessment & Plan:   No diagnosis found.  No problem-specific Assessment & Plan notes found for this encounter.  Pt was in the office today for 25+ minutes, I spent over 50% of  time in face to face counseling of patient's various medical conditions and in coordination of care  FOLLOW-UP:  No follow-ups on file.

## 2018-01-16 ENCOUNTER — Ambulatory Visit: Payer: Medicare Other | Admitting: Adult Health

## 2018-01-16 NOTE — Progress Notes (Signed)
Subjective:    Patient ID: Herbert Marquez, male    DOB: May 11, 1927, 84 y.o.   MRN: [id number]  HPI:  10/14/17 OV: Herbert Marquez presents for hospital f/u 10/04/2017-10/07/2017 1. Expressive aphasia most likely due to TIA: Herbert Marquez presented with SOB, confusion and expressive aphasia and a code stroke was called. He was not given tPA due to hemorrhagic conversion in 03/2016 after tPa was administered. His CT and MRI were negative for acute abnormalities. His symptoms completely resolved in less than 24 hours. He is being sent to a SNF. 2. Hypertension:122/80 this morning.Per neurology recommendations, permissive HTN for 24 hours. Per chart review, he is not on any antihypertensives, withwell controlled BP since 2015. 3. DM: Patient has a previous diagnosis of DM; however, per extensive chart review his A1c has been below diabetic range since 2010 and has been off his diabetic medications since 06/2016. Disposition and follow-up:   Herbert Marquez was discharged from [facility] in Stable condition.  At the hospital follow up visit please address:  1.  TIA- reasess neurologic status for no recurrent symptoms    2.  Labs / imaging needed at time of follow-up: none  3.  Pending labs/ test needing follow-up: none  He was transferred from [facility] to [place] 10/07/17-10/12/17 He will start home health this week.  He continues to perform home strengthening program: 20-30 mins/day He reports good appetite and denies CP/dyspnea/dizziness/palpitations.  He is followed by Neurology/Dr. Rosalin Hawking- has f/u 10/21/17 He is followed by Cards/Dr. Carlota Raspberry f/u 11/12/17  Reviewed all notes, labs, imaging from recent hospitalization. Wife at [facility]  during [place]  01/20/18 OV: Herbert Marquez is here for f/u: HTN, Afib, CVA, TIA, CAD He was seen by Cardiology 11/12/17- Follow DASH diet, no change to medications.  Follow-up 6 months He was seen by Neurology 10/21/17-Continues to take Eliquis  for atrial fibrillation and and aspirin 81 mg secondary stroke prevention without side effects of bleeding or bruising.  Continues to take Lipitor without side effects myalgias.Patient continues to take Keppra without side effects or recent seizure activity.  Follow-up 6 months  He reports medication compliance, denies SE He reports 37mins of calisthenics after breakfast and lunch daily He denies recent falls He has upcoming appt with Ortho for R knee steroid injection He denies acute complaints today  Patient Care Team    Relationship Specialty Notifications Start End  [name], NP PCP - General Family Medicine  03/20/16   Nahser, Wonda Cheng, MD PCP - Cardiology Cardiology Admissions 11/12/17   Rod Can, MD Consulting Physician Orthopedic Surgery  03/20/16   Jarome Matin, MD Consulting Physician Dermatology  03/20/16   Nickie Retort, MD Consulting Physician Urology  03/20/16   Shon Hough, MD Consulting Physician Ophthalmology  03/20/16     Patient Active Problem List   Diagnosis Date Noted  . Low blood pressure reading 10/14/2017  . Cerebral thrombosis with cerebral infarction 10/05/2017  . Expressive aphasia 10/04/2017  . TIA (transient ischemic attack)   . Presence of permanent cardiac pacemaker   . Hypertension   . Hyperlipidemia   . Coronary artery disease   . Basal cell carcinoma   . Arthritis   . Pressure injury of skin 01/08/2017  . Stroke ([place]) 01/07/2017  . Ischemic stroke ([place])   . Second degree Mobitz II AV block 10/23/2016  . Seizures ([place]) 10/09/2016  . Healthcare maintenance 10/01/2016  . Restlessness and agitation 07/02/2016  . Vascular dementia  with behavior disturbance ([place]) 06/29/2016  . Hemiparesis and other late effects of cerebrovascular accident ([place]) 06/29/2016  . Embolic stroke ([place]) 87/56/4332  . Right hemiparesis ([place])   . History of CVA with residual deficit   . Chronic anticoagulation   . Atrial fibrillation with rapid  ventricular response ([place])   . Seizure prophylaxis   . Diastolic dysfunction   . Bacteremia due to Gram-positive bacteria   . Altered mental status   . Dementia without behavioral disturbance ([place])   . Leukocytosis   . Acute blood loss anemia   . Acute encephalopathy 06/04/2016  . PAF (paroxysmal atrial fibrillation) ([place])   . Iron deficiency anemia 04/04/2016  . History of stroke 04/04/2016  . Cerebral hemorrhage (HCC) w/ SDH s/p IV tPA   . Coronary artery disease involving native coronary artery of native heart without angina pectoris   . Orthostatic hypotension   . History of right hip replacement   . Acute ischemic stroke ([place]) - L temporal lobe s/p tPA 03/30/2016  . BPH (benign prostatic hyperplasia) 11/25/2015  . GERD (gastroesophageal reflux disease) 11/25/2015  . CKD (chronic kidney disease), stage II 11/25/2015  . Overactive bladder   . Cerebrovascular accident (CVA) ([place]) 09/18/2015  . Gait disturbance, post-stroke 06/23/2015  . 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 Thalamic infarction ([place]) 06/21/2015  . Benign essential HTN   . Type 2 diabetes mellitus with complication, without long-term current use of insulin ([place])   . Dysphagia as late effect of cerebrovascular disease   . Hyponatremia   . HLD (hyperlipidemia)   . COPD (chronic obstructive pulmonary disease) ([place]) 03/09/2011     Past Medical History:  Diagnosis Date  . Acute blood loss anemia   . Acute encephalopathy 06/04/2016  . Acute ischemic stroke ([place]) - L temporal lobe s/p tPA 03/30/2016  . Altered mental status   . Arthritis    "pretty much all over"   . 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 Atrial fibrillation with rapid ventricular response ([place])   . Basal cell carcinoma    "several burned off his body, face, head"  . Benign essential HTN   . BPH (benign prostatic hypertrophy)   . Cerebral hemorrhage (HCC) w/ SDH s/p IV tPA   . Cerebral thrombosis with cerebral infarction 10/05/2017  .  Cerebrovascular accident (CVA) ([place]) 09/18/2015  . Chronic anticoagulation   . CKD (chronic kidney disease), stage II 11/25/2015  . COPD (chronic obstructive pulmonary disease) ([place]) 03/09/2011  . Coronary artery disease    a. s/p PCI of RCA in 2006  . Coronary artery disease involving native coronary artery of native heart without angina pectoris   . CVA (cerebral infarction)    a. 06/2015: left thalamic and bilateral PCA  . Dementia without behavioral disturbance ([place])   . Dysphagia as late effect of cerebrovascular disease   . Embolic stroke ([place]) 11/08/1882  . Expressive aphasia 10/04/2017  . Gait disturbance, post-stroke 06/23/2015  . GERD (gastroesophageal reflux disease)   . Hemiparesis and other late effects of cerebrovascular accident ([place]) 06/29/2016  . History of stroke 04/04/2016  . HLD (hyperlipidemia)   . Hyperlipidemia   . Hypertension   . Ischemic stroke ([place])   . Low blood pressure reading 10/14/2017  . Orthostatic hypotension   . PAF (paroxysmal atrial fibrillation) ([place])   . Presence of permanent cardiac pacemaker   . Right hemiparesis ([place])   . Second degree Mobitz II AV block 10/23/2016  . Seizures ([place])   .  Stroke ([place])   . Thalamic infarction ([place]) 06/21/2015  . TIA (transient ischemic attack)    Approximately 6 weeks post-cardiac catheterization.   . Type 2 diabetes mellitus with complication, without long-term current use of insulin ([place])   . Vascular dementia with behavior disturbance ([place]) 06/29/2016     Past Surgical History:  Procedure Laterality Date  . CARDIOVASCULAR STRESS TEST  07/01/2007   EF 74%  . CATARACT EXTRACTION, BILATERAL    . CORONARY ANGIOPLASTY WITH STENT PLACEMENT  10/2004   stenting x 2 to RCA  . FEMUR IM NAIL Right 11/26/2015   Procedure: INTRAMEDULLARY RIGHT (IM) NAIL FEMORAL;  Surgeon: Rod Can, MD;  Location: [facility];  Service: Orthopedics;  Laterality: Right;  . FRACTURE SURGERY    . HERNIA REPAIR    . HIP ARTHROPLASTY  03/09/2011    Procedure: ARTHROPLASTY BIPOLAR HIP;  Surgeon: Mauri Pole;  Location: [facility];  Service: Orthopedics;  Laterality: Left;  . INSERT / REPLACE / REMOVE PACEMAKER  10/23/2016  . LAPAROSCOPIC INCISIONAL / UMBILICAL / VENTRAL HERNIA REPAIR     "below his naval"  . PACEMAKER IMPLANT N/A 10/23/2016   Procedure: Pacemaker Implant;  Surgeon: Thompson Grayer, MD;  Location: [place] [facility];  Service: Cardiovascular;  Laterality: N/A;     Family History  Problem Relation Age of Onset  . Hypertension Mother   . Lung cancer Father   . Lung cancer Brother      Social History   Substance and Sexual Activity  Drug Use No     Social History   Substance and Sexual Activity  Alcohol Use Yes  . Alcohol/week: 8.0 standard drinks  . Types: 7 Glasses of wine, 1 Cans of beer per week   Comment: 1/2 BEER AND 1 WINE     Social History   Tobacco Use  Smoking Status Former Smoker  . Last attempt to quit: 03/06/1971  . Years since quitting: 46.9  Smokeless Tobacco Never Used     Outpatient Encounter Medications as of 01/20/2018  Medication Sig  . acetaminophen (TYLENOL) 325 MG tablet Take 2 tablets (650 mg total) every 4 (four) hours as needed by mouth for mild pain (or temp > 37.5 C (99.5 F)).  [name] apixaban (ELIQUIS) 5 MG TABS tablet Take 1 tablet (5 mg total) by mouth 2 (two) times daily.  [name] aspirin EC 81 MG tablet Take 1 tablet (81 mg total) by mouth daily.  [name] atorvastatin (LIPITOR) 80 MG tablet Take 80 mg by mouth daily.  [name] ENSURE (ENSURE) Take 237 mLs by mouth daily as needed (nutrition).  [name] levETIRAcetam (KEPPRA) 750 MG tablet Take 1 tablet (750 mg total) by mouth 2 (two) times daily.  . Multiple Vitamin (MULTIVITAMIN WITH MINERALS) TABS tablet Take 1 tablet by mouth daily.  . nitroGLYCERIN (NITROSTAT) 0.4 MG SL tablet Place 1 tablet (0.4 mg total) under the tongue every 5 (five) minutes as needed for chest pain.  . phenylephrine-shark liver oil-mineral oil-petrolatum (PREPARATION H)  0.25-3-14-71.9 % rectal ointment Place 1 application rectally as needed for hemorrhoids.  . polyvinyl alcohol (ARTIFICIAL TEARS) 1.4 % ophthalmic solution Place 1 drop into both eyes daily as needed for dry eyes.  [name] QUEtiapine (SEROQUEL) 25 MG tablet Take 1 tablet (25 mg total) by mouth at bedtime.  . ranitidine (ZANTAC) 150 MG tablet Take 150 mg by mouth as needed for heartburn.   No facility-administered encounter medications on file as of 01/20/2018.     Allergies: Patient has  no known allergies.  There is no height or weight on file to calculate BMI.  Blood pressure 110/68, pulse 62, temperature 97.6 F (36.4 C), temperature source Oral, SpO2 97 %.  Review of Systems  Constitutional: Positive for fatigue. Negative for activity change, appetite change, chills, diaphoresis, fever and unexpected weight change.  HENT: Positive for hearing loss.   Eyes: Negative for visual disturbance.  Respiratory: Negative for cough, chest tightness, shortness of breath, wheezing and stridor.   Cardiovascular: Negative for chest pain, palpitations and leg swelling.  Gastrointestinal: Negative for abdominal distention, abdominal pain, blood in stool, constipation, diarrhea, nausea and vomiting.  Musculoskeletal: Positive for arthralgias, back pain, gait problem, joint swelling, myalgias, neck pain and neck stiffness.  Skin: Negative for color change, pallor, rash and wound.  Neurological: Negative for dizziness and headaches.  Hematological: Bruises/bleeds easily.  Psychiatric/Behavioral: Negative for confusion, decreased concentration, dysphoric mood, hallucinations, self-injury, sleep disturbance and suicidal ideas. The patient is not nervous/anxious and is not hyperactive.        Objective:   Physical Exam  Constitutional: He is oriented to person, place, and time. He appears well-developed and well-nourished. No distress.  HENT:  Head: Normocephalic and atraumatic.  Right Ear: External ear  normal. Decreased hearing is noted.  Left Ear: External ear normal. Decreased hearing is noted.  Nose: Nose normal.  Mouth/Throat: Oropharynx is clear and moist.  Eyes: Pupils are equal, round, and reactive to light. Conjunctivae and EOM are normal.  Cardiovascular: Normal rate, regular rhythm, normal heart sounds and intact distal pulses.  No murmur heard. Pulmonary/Chest: Effort normal and breath sounds normal. No stridor. No respiratory distress. He has no wheezes. He has no rales. He exhibits no tenderness.  Musculoskeletal: He exhibits edema.       Right knee: He exhibits swelling.  Neurological: He is alert and oriented to person, place, and time.  Skin: Skin is warm and dry. Capillary refill takes less than 2 seconds. No rash noted. He is not diaphoretic. No erythema. No pallor.  Psychiatric: He has a normal mood and affect. His behavior is normal. Judgment and thought content normal.      Assessment & Plan:   1. Healthcare maintenance   2. Essential hypertension   3. Restlessness and agitation     Healthcare maintenance Continue all medications as directed. Continue to drink plenty of water and follow Mediterranean diet. Continue daily exercise. Continue follow-up with Neurology and Cardiology as directed. Follow-up in 6 months for complete physicals with fasting labs.  Hypertension BP at goal 110/68, HR 62 Not currently on antihypertensives   Restlessness and agitation Well controlled on Quetiapine 25mg  QD  Pt was in the office today for 25+ minutes, I spent over 75% of  time in face to face counseling of patient's various medical conditions and in coordination of care  FOLLOW-UP:  Return in about 6 months (around 07/21/2018) for CPE, Fasting Labs.

## 2018-01-20 ENCOUNTER — Encounter: Payer: Self-pay | Admitting: Adult Health

## 2018-01-20 ENCOUNTER — Ambulatory Visit (INDEPENDENT_AMBULATORY_CARE_PROVIDER_SITE_OTHER): Payer: Medicare Other | Admitting: Adult Health

## 2018-01-20 VITALS — BP 110/68 | HR 62 | Temp 97.6°F

## 2018-01-20 DIAGNOSIS — Z0000 Encounter for general adult medical examination without abnormal findings: Secondary | ICD-10-CM | POA: Diagnosis not present

## 2018-01-20 DIAGNOSIS — I2510 Atherosclerotic heart disease of native coronary artery without angina pectoris: Secondary | ICD-10-CM | POA: Diagnosis not present

## 2018-01-20 DIAGNOSIS — I10 Essential (primary) hypertension: Secondary | ICD-10-CM

## 2018-01-20 DIAGNOSIS — R451 Restlessness and agitation: Secondary | ICD-10-CM | POA: Diagnosis not present

## 2018-01-20 NOTE — Patient Instructions (Signed)
Mediterranean Diet A Mediterranean diet refers to food and lifestyle choices that are based on the traditions of countries located on the [employer]. This way of eating has been shown to help prevent certain conditions and improve outcomes for people who have chronic diseases, like kidney disease and heart disease. What are tips for following this plan? Lifestyle  Cook and eat meals together with your family, when possible.  Drink enough fluid to keep your urine clear or pale yellow.  Be physically active every day. This includes: ? Aerobic exercise like running or swimming. ? Leisure activities like gardening, walking, or housework.  Get 7-8 hours of sleep each night.  Reading food labels  Check the serving size of packaged foods. For foods such as rice and pasta, the serving size refers to the amount of cooked product, not dry.  Check the total fat in packaged foods. Avoid foods that have saturated fat or trans fats.  Check the ingredients list for added sugars, such as corn syrup. Shopping  At the grocery store, buy most of your food from the areas near the walls of the store. This includes: ? Fresh fruits and vegetables (produce). ? Grains, beans, nuts, and seeds. Some of these may be available in unpackaged forms or large amounts (in bulk). ? Fresh seafood. ? Poultry and eggs. ? Low-fat dairy products.  Buy whole ingredients instead of prepackaged foods.  Buy fresh fruits and vegetables in-season from local farmers markets.  Buy frozen fruits and vegetables in resealable bags.  If you do not have access to quality fresh seafood, buy precooked frozen shrimp or canned fish, such as tuna, salmon, or sardines.  Buy small amounts of raw or cooked vegetables, salads, or olives from the deli or salad bar at your store.  Stock your pantry so you always have certain foods on hand, such as olive oil, canned tuna, canned tomatoes, rice, pasta, and beans. Cooking  Cook  foods with extra-virgin olive oil instead of using butter or other vegetable oils.  Have meat as a side dish, and have vegetables or grains as your main dish. This means having meat in small portions or adding small amounts of meat to foods like pasta or stew.  Use beans or vegetables instead of meat in common dishes like chili or lasagna.  Experiment with different cooking methods. Try roasting or broiling vegetables instead of steaming or sauteing them.  Add frozen vegetables to soups, stews, pasta, or rice.  Add nuts or seeds for added healthy fat at each meal. You can add these to yogurt, salads, or vegetable dishes.  Marinate fish or vegetables using olive oil, lemon juice, garlic, and fresh herbs. Meal planning  Plan to eat 1 vegetarian meal one day each week. Try to work up to 2 vegetarian meals, if possible.  Eat seafood 2 or more times a week.  Have healthy snacks readily available, such as: ? Vegetable sticks with hummus. ? [place] yogurt. ? Fruit and nut trail mix.  Eat balanced meals throughout the week. This includes: ? Fruit: 2-3 servings a day ? Vegetables: 4-5 servings a day ? Low-fat dairy: 2 servings a day ? Fish, poultry, or lean meat: 1 serving a day ? Beans and legumes: 2 or more servings a week ? Nuts and seeds: 1-2 servings a day ? Whole grains: 6-8 servings a day ? Extra-virgin olive oil: 3-4 servings a day  Limit red meat and sweets to only a few servings a month What are my  food choices?  Mediterranean diet ? Recommended ? Grains: Whole-grain pasta. Brown rice. Bulgar wheat. Polenta. Couscous. Whole-wheat bread. [place]. ? Vegetables: Artichokes. Beets. Broccoli. Cabbage. Carrots. Eggplant. Green beans. Chard. Kale. Spinach. Onions. Leeks. Peas. Squash. Tomatoes. Peppers. Radishes. ? Fruits: Apples. Apricots. Avocado. Berries. Bananas. Cherries. Dates. Figs. Grapes. Lemons. Melon. Oranges. Peaches. Plums. Pomegranate. ? Meats and other  protein foods: Beans. Almonds. Sunflower seeds. Pine nuts. Peanuts. [place]. Salmon. Scallops. Shrimp. [place]. Tilapia. Clams. Oysters. Eggs. ? Dairy: Low-fat milk. Cheese. Greek yogurt. ? Beverages: Water. Red wine. Herbal tea. ? Fats and oils: Extra virgin olive oil. Avocado oil. Grape seed oil. ? Sweets and desserts: [place] yogurt with honey. Baked apples. Poached pears. Trail mix. ? Seasoning and other foods: Basil. Cilantro. Coriander. Cumin. Mint. Parsley. Sage. Rosemary. Tarragon. Garlic. Oregano. Thyme. Pepper. Balsalmic vinegar. Tahini. Hummus. Tomato sauce. Olives. Mushrooms. ? Limit these ? Grains: Prepackaged pasta or rice dishes. Prepackaged cereal with added sugar. ? Vegetables: Deep fried potatoes (french fries). ? Fruits: Fruit canned in syrup. ? Meats and other protein foods: Beef. Pork. Lamb. Poultry with skin. Hot dogs. [place] Salines. ? Dairy: Ice cream. Sour cream. Whole milk. ? Beverages: Juice. Sugar-sweetened soft drinks. Beer. Liquor and spirits. ? Fats and oils: Butter. [place] oil. Vegetable oil. Beef fat (tallow). Lard. ? Sweets and desserts: Cookies. Cakes. Pies. Candy. ? Seasoning and other foods: Mayonnaise. Premade sauces and marinades. ? The items listed may not be a complete list. Talk with your dietitian about what dietary choices are right for you. Summary  The Mediterranean diet includes both food and lifestyle choices.  Eat a variety of fresh fruits and vegetables, beans, nuts, seeds, and whole grains.  Limit the amount of red meat and sweets that you eat.  Talk with your health care provider about whether it is safe for you to drink red wine in moderation. This means 1 glass a day for nonpregnant women and 2 glasses a day for men. A glass of wine equals 5 oz (150 mL). This information is not intended to replace advice given to you by your health care provider. Make sure you discuss any questions you have with your health care provider. Document Released: 10/13/2015  Document Revised: 11/15/2015 Document Reviewed: 10/13/2015 Elsevier Interactive Patient Education  2018 [place] all medications as directed. Continue to drink plenty of water and follow Mediterranean diet. Continue daily exercise. Continue follow-up with Neurology and Cardiology as directed. Follow-up in 6 months for complete physicals with fasting labs. NICE TO SEE YOU!

## 2018-01-20 NOTE — Assessment & Plan Note (Signed)
Well controlled on Quetiapine 25mg  QD

## 2018-01-20 NOTE — Assessment & Plan Note (Signed)
Continue all medications as directed. Continue to drink plenty of water and follow Mediterranean diet. Continue daily exercise. Continue follow-up with Neurology and Cardiology as directed. Follow-up in 6 months for complete physicals with fasting labs.

## 2018-01-20 NOTE — Assessment & Plan Note (Signed)
BP at goal 110/68, HR 62 Not currently on antihypertensives

## 2018-01-24 LAB — CUP PACEART REMOTE DEVICE CHECK
Battery Remaining Longevity: 121 mo
Battery Remaining Percentage: 95.5 %
Battery Voltage: 3.01 V
Brady Statistic AP VP Percent: 12 %
Brady Statistic AP VS Percent: 13 %
Brady Statistic AS VP Percent: 1 %
Brady Statistic AS VS Percent: 74 %
Brady Statistic RA Percent Paced: 24 %
Brady Statistic RV Percent Paced: 13 %
Date Time Interrogation Session: 20191008200724
Implantable Lead Implant Date: 20180821
Implantable Lead Implant Date: 20180821
Implantable Lead Location: 753859
Implantable Lead Location: 753860
Implantable Pulse Generator Implant Date: 20180821
Lead Channel Impedance Value: 430 Ohm
Lead Channel Impedance Value: 550 Ohm
Lead Channel Pacing Threshold Amplitude: 0.625 V
Lead Channel Pacing Threshold Amplitude: 0.875 V
Lead Channel Pacing Threshold Pulse Width: 0.5 ms
Lead Channel Pacing Threshold Pulse Width: 0.5 ms
Lead Channel Sensing Intrinsic Amplitude: 10.8 mV
Lead Channel Sensing Intrinsic Amplitude: 4.2 mV
Lead Channel Setting Pacing Amplitude: 1.125
Lead Channel Setting Pacing Amplitude: 1.625
Lead Channel Setting Pacing Pulse Width: 0.5 ms
Lead Channel Setting Sensing Sensitivity: 2 mV
Pulse Gen Model: 2272
Pulse Gen Serial Number: 8933379

## 2018-02-17 ENCOUNTER — Ambulatory Visit: Payer: Medicare Other | Admitting: Physical Medicine & Rehabilitation

## 2018-02-20 ENCOUNTER — Ambulatory Visit (HOSPITAL_BASED_OUTPATIENT_CLINIC_OR_DEPARTMENT_OTHER): Payer: Medicare Other | Admitting: Physical Medicine & Rehabilitation

## 2018-02-20 ENCOUNTER — Encounter: Payer: Self-pay | Admitting: Physical Medicine & Rehabilitation

## 2018-02-20 ENCOUNTER — Encounter: Payer: Medicare Other | Attending: Physical Medicine & Rehabilitation

## 2018-02-20 VITALS — BP 164/84 | HR 62 | Ht 67.0 in | Wt 161.0 lb

## 2018-02-20 DIAGNOSIS — M1711 Unilateral primary osteoarthritis, right knee: Secondary | ICD-10-CM

## 2018-02-20 NOTE — Progress Notes (Signed)
Knee injection   Indication:Right Knee pain not relieved by medication management and other conservative care.  Informed consent was obtained after describing risks and benefits of the procedure with the patient, this includes bleeding, bruising, infection and medication side effects. The patient wishes to proceed and has given written consent. The patient was placed in a recumbent position. The medial aspect of the knee was marked and prepped with Betadine and alcohol. It was then entered with a 25-gauge 1-1/2 inch needle was inserted into the knee joint. After negative draw back for blood, a solution containing 5 ML of 32mg  Zilretta were injected. The patient tolerated the procedure well. Post procedure instructions were given.

## 2018-03-11 ENCOUNTER — Ambulatory Visit (INDEPENDENT_AMBULATORY_CARE_PROVIDER_SITE_OTHER): Payer: Medicare Other

## 2018-03-11 DIAGNOSIS — I441 Atrioventricular block, second degree: Secondary | ICD-10-CM | POA: Diagnosis not present

## 2018-03-12 NOTE — Progress Notes (Signed)
Remote pacemaker transmission.   

## 2018-03-13 LAB — CUP PACEART REMOTE DEVICE CHECK
Battery Remaining Longevity: 104 mo
Battery Remaining Percentage: 95.5 %
Battery Voltage: 2.99 V
Brady Statistic AP VP Percent: 15 %
Brady Statistic AP VS Percent: 11 %
Brady Statistic AS VP Percent: 1.2 %
Brady Statistic AS VS Percent: 72 %
Brady Statistic RA Percent Paced: 25 %
Brady Statistic RV Percent Paced: 17 %
Date Time Interrogation Session: 20200107115533
Implantable Lead Implant Date: 20180821
Implantable Lead Implant Date: 20180821
Implantable Lead Location: 753859
Implantable Lead Location: 753860
Implantable Pulse Generator Implant Date: 20180821
Lead Channel Impedance Value: 490 Ohm
Lead Channel Impedance Value: 580 Ohm
Lead Channel Pacing Threshold Amplitude: 0.75 V
Lead Channel Pacing Threshold Amplitude: 3.75 V
Lead Channel Pacing Threshold Pulse Width: 0.5 ms
Lead Channel Pacing Threshold Pulse Width: 0.5 ms
Lead Channel Sensing Intrinsic Amplitude: 12 mV
Lead Channel Sensing Intrinsic Amplitude: 4.7 mV
Lead Channel Setting Pacing Amplitude: 1.75 V
Lead Channel Setting Pacing Amplitude: 5 V
Lead Channel Setting Pacing Pulse Width: 0.5 ms
Lead Channel Setting Sensing Sensitivity: 2 mV
Pulse Gen Model: 2272
Pulse Gen Serial Number: 8933379

## 2018-03-29 ENCOUNTER — Other Ambulatory Visit: Payer: Self-pay | Admitting: Neurology

## 2018-03-31 ENCOUNTER — Other Ambulatory Visit: Payer: Self-pay

## 2018-03-31 MED ORDER — LEVETIRACETAM 750 MG PO TABS: 750.0000 mg | ORAL_TABLET | Freq: Two times a day (BID) | ORAL | 2 refills | Status: DC

## 2018-03-31 NOTE — Telephone Encounter (Signed)
Please refill 12-month supply x 2 refills

## 2018-03-31 NOTE — Telephone Encounter (Signed)
Refill done 90 day supply with two refills.

## 2018-04-12 ENCOUNTER — Other Ambulatory Visit: Payer: Self-pay | Admitting: Adult Health

## 2018-04-23 ENCOUNTER — Ambulatory Visit: Payer: Medicare Other | Admitting: Adult Health

## 2018-05-10 ENCOUNTER — Other Ambulatory Visit: Payer: Self-pay | Admitting: Adult Health

## 2018-05-23 ENCOUNTER — Ambulatory Visit: Payer: Medicare Other | Admitting: Physical Medicine & Rehabilitation

## 2018-06-10 ENCOUNTER — Ambulatory Visit (INDEPENDENT_AMBULATORY_CARE_PROVIDER_SITE_OTHER): Payer: Medicare Other | Admitting: *Deleted

## 2018-06-10 ENCOUNTER — Other Ambulatory Visit: Payer: Self-pay

## 2018-06-10 DIAGNOSIS — I441 Atrioventricular block, second degree: Secondary | ICD-10-CM | POA: Diagnosis not present

## 2018-06-10 LAB — CUP PACEART REMOTE DEVICE CHECK
Battery Remaining Longevity: 129 mo
Battery Remaining Percentage: 95.5 %
Battery Voltage: 3.01 V
Brady Statistic AP VP Percent: 17 %
Brady Statistic AP VS Percent: 9.8 %
Brady Statistic AS VP Percent: 1.5 %
Brady Statistic AS VS Percent: 71 %
Brady Statistic RA Percent Paced: 26 %
Brady Statistic RV Percent Paced: 19 %
Date Time Interrogation Session: 20200407064150
Implantable Lead Implant Date: 20180821
Implantable Lead Implant Date: 20180821
Implantable Lead Location: 753859
Implantable Lead Location: 753860
Implantable Pulse Generator Implant Date: 20180821
Lead Channel Impedance Value: 460 Ohm
Lead Channel Impedance Value: 550 Ohm
Lead Channel Pacing Threshold Amplitude: 0.75 V
Lead Channel Pacing Threshold Amplitude: 0.75 V
Lead Channel Pacing Threshold Pulse Width: 0.5 ms
Lead Channel Pacing Threshold Pulse Width: 0.5 ms
Lead Channel Sensing Intrinsic Amplitude: 11.7 mV
Lead Channel Sensing Intrinsic Amplitude: 4.2 mV
Lead Channel Setting Pacing Amplitude: 1 V
Lead Channel Setting Pacing Amplitude: 1.75 V
Lead Channel Setting Pacing Pulse Width: 0.5 ms
Lead Channel Setting Sensing Sensitivity: 2 mV
Pulse Gen Model: 2272
Pulse Gen Serial Number: 8933379

## 2018-06-19 ENCOUNTER — Encounter: Payer: Self-pay | Admitting: Cardiology

## 2018-06-19 NOTE — Progress Notes (Signed)
Remote pacemaker transmission.   

## 2018-06-20 ENCOUNTER — Ambulatory Visit: Payer: Medicare Other | Admitting: Physical Medicine & Rehabilitation

## 2018-06-20 ENCOUNTER — Ambulatory Visit: Payer: Medicare Other

## 2018-06-25 NOTE — Progress Notes (Signed)
Virtual Visit via Telephone Note  I connected with Herbert Marquez on 06/26/18 at  9:45 AM EDT by telephone and verified that I am speaking with the correct person using two identifiers.   I discussed the limitations, risks, security and privacy concerns of performing an evaluation and management service by telephone and the availability of in person appointments. The staff discussed with the patient that there may be a patient responsible charge related to this service. The patient expressed understanding and agreed to proceed.  Location of Patient- Home Location of Provider- In Clinic   Subjective:   Herbert Marquez is a 85 y.o. male who presents for Medicare Annual/Subsequent preventive examination.  Review of Systems: General:   No F/C, wt loss Pulm:   No DIB, no increased in SOB, pleuritic chest pain Card:  No CP, palpitations Abd:  No n/v/d or pain Ext:  No inc edema from baseline, continues to use assistive devices for ambulation    Objective:    Vitals: BP 133/80   Pulse 76   Temp (!) 96.2 F (35.7 C) (Oral)   Ht 5\' 7"  (1.702 m)   Wt 170 lb 1.6 oz (77.2 kg)   BMI 26.64 kg/m   Body mass index is 26.64 kg/m.  Advanced Directives 10/05/2017 04/16/2017 01/07/2017 01/07/2017 10/23/2016 10/23/2016 10/05/2016  Does Patient Have a Medical Advance Directive? No Yes Yes Yes Yes Yes Yes  Type of Advance Directive - [place];Living will;Out of facility DNR (pink MOST or yellow form) [place];Living will [place];Living will Living will [place];Living will [place];Living will  Does patient want to make changes to medical advance directive? - No - Patient declined No - Patient declined No - Patient declined No - Patient declined - -  Copy of [place] in Chart? - No - copy requested No - copy requested No - copy requested - - -  Would patient like information on creating a  medical advance directive? No - Patient declined - - - - - -  Pre-existing out of facility DNR order (yellow form or pink MOST form) - - - - - - -    Tobacco Social History   Tobacco Use  Smoking Status Former Smoker  . Last attempt to quit: 03/06/1971  . Years since quitting: 47.3  Smokeless Tobacco Never Used     Counseling given: Not Answered   Past Medical History:  Diagnosis Date  . Acute blood loss anemia   . Acute encephalopathy 06/04/2016  . Acute ischemic stroke ([place]) - L temporal lobe s/p tPA 03/30/2016  . Altered mental status   . Arthritis    "pretty much all over"   . 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 Atrial fibrillation with rapid ventricular response ([place])   . Basal cell carcinoma    "several burned off his body, face, head"  . Benign essential HTN   . BPH (benign prostatic hypertrophy)   . Cerebral hemorrhage (HCC) w/ SDH s/p IV tPA   . Cerebral thrombosis with cerebral infarction 10/05/2017  . Cerebrovascular accident (CVA) ([place]) 09/18/2015  . Chronic anticoagulation   . CKD (chronic kidney disease), stage II 11/25/2015  . COPD (chronic obstructive pulmonary disease) ([place]) 03/09/2011  . Coronary artery disease    a. s/p PCI of RCA in 2006  . Coronary artery disease involving native coronary artery of native heart without angina pectoris   . CVA (  cerebral infarction)    a. 06/2015: left thalamic and bilateral PCA  . Dementia without behavioral disturbance ([place])   . Dysphagia as late effect of cerebrovascular disease   . Embolic stroke ([place]) 08/11/6293  . Expressive aphasia 10/04/2017  . Gait disturbance, post-stroke 06/23/2015  . GERD (gastroesophageal reflux disease)   . Hemiparesis and other late effects of cerebrovascular accident ([place]) 06/29/2016  . History of stroke 04/04/2016  . HLD (hyperlipidemia)   . Hyperlipidemia   . Hypertension   . Ischemic stroke ([place])   . Low blood pressure reading 10/14/2017  . Orthostatic hypotension   . PAF (paroxysmal  atrial fibrillation) ([place])   . Presence of permanent cardiac pacemaker   . Right hemiparesis ([place])   . Second degree Mobitz II AV block 10/23/2016  . Seizures ([place])   . Stroke ([place])   . Thalamic infarction ([place]) 06/21/2015  . TIA (transient ischemic attack)    Approximately 6 weeks post-cardiac catheterization.   . Type 2 diabetes mellitus with complication, without long-term current use of insulin ([place])   . Vascular dementia with behavior disturbance ([place]) 06/29/2016   Past Surgical History:  Procedure Laterality Date  . CARDIOVASCULAR STRESS TEST  07/01/2007   EF 74%  . CATARACT EXTRACTION, BILATERAL    . CORONARY ANGIOPLASTY WITH STENT PLACEMENT  10/2004   stenting x 2 to RCA  . FEMUR IM NAIL Right 11/26/2015   Procedure: INTRAMEDULLARY RIGHT (IM) NAIL FEMORAL;  Surgeon: Rod Can, MD;  Location: [facility];  Service: Orthopedics;  Laterality: Right;  . FRACTURE SURGERY    . HERNIA REPAIR    . HIP ARTHROPLASTY  03/09/2011   Procedure: ARTHROPLASTY BIPOLAR HIP;  Surgeon: Mauri Pole;  Location: [facility];  Service: Orthopedics;  Laterality: Left;  . INSERT / REPLACE / REMOVE PACEMAKER  10/23/2016  . LAPAROSCOPIC INCISIONAL / UMBILICAL / VENTRAL HERNIA REPAIR     "below his naval"  . PACEMAKER IMPLANT N/A 10/23/2016   Procedure: Pacemaker Implant;  Surgeon: Thompson Grayer, MD;  Location: [facility];  Service: Cardiovascular;  Laterality: N/A;   Family History  Problem Relation Age of Onset  . Hypertension Mother   . Lung cancer Father   . Lung cancer Brother    Social History   Socioeconomic History  . Marital status: Married    Spouse name: Not on file  . Number of children: Not on file  . Years of education: Not on file  . Highest education level: Not on file  Occupational History  . Occupation: Retired    Fish farm manager: OTHER  Social Needs  . Financial resource strain: Not on file  . Food insecurity:    Worry: Not on file    Inability: Not on file  . Transportation  needs:    Medical: Not on file    Non-medical: Not on file  Tobacco Use  . Smoking status: Former Smoker    Last attempt to quit: 03/06/1971    Years since quitting: 47.3  . Smokeless tobacco: Never Used  Substance and Sexual Activity  . Alcohol use: Yes    Alcohol/week: 8.0 standard drinks    Types: 7 Glasses of wine, 1 Cans of beer per week    Comment: 1/2 BEER AND 1 WINE  . Drug use: No  . Sexual activity: Never  Lifestyle  . Physical activity:    Days per week: Not on file    Minutes per session: Not on file  . Stress: Not on file  Relationships  . Social connections:    Talks on phone: Not on file    Gets together: Not on file    Attends religious service: Not on file    Active member of club or organization: Not on file    Attends meetings of clubs or organizations: Not on file    Relationship status: Not on file  Other Topics Concern  . Not on file  Social History Narrative   Lives in [place], [place] with wife. Has 3 children.     Outpatient Encounter Medications as of 06/26/2018  Medication Sig  . acetaminophen (TYLENOL) 325 MG tablet Take 2 tablets (650 mg total) every 4 (four) hours as needed by mouth for mild pain (or temp > 37.5 C (99.5 F)).  [name] apixaban (ELIQUIS) 5 MG TABS tablet Take 1 tablet (5 mg total) by mouth 2 (two) times daily.  [name] aspirin EC 81 MG tablet Take 1 tablet (81 mg total) by mouth daily.  [name] atorvastatin (LIPITOR) 80 MG tablet Take 1 tablet (80 mg total) by mouth daily.  [name] levETIRAcetam (KEPPRA) 750 MG tablet Take 1 tablet (750 mg total) by mouth 2 (two) times daily.  . Multiple Vitamin (MULTIVITAMIN WITH MINERALS) TABS tablet Take 1 tablet by mouth daily.  . nitroGLYCERIN (NITROSTAT) 0.4 MG SL tablet Place 1 tablet (0.4 mg total) under the tongue every 5 (five) minutes as needed for chest pain.  . phenylephrine-shark liver oil-mineral oil-petrolatum (PREPARATION H) 0.25-3-14-71.9 % rectal ointment Place 1 application rectally as needed for  hemorrhoids.  . polyvinyl alcohol (ARTIFICIAL TEARS) 1.4 % ophthalmic solution Place 1 drop into both eyes daily as needed for dry eyes.  [name] QUEtiapine (SEROQUEL) 25 MG tablet Take 1 tablet (25 mg total) by mouth at bedtime.  . ranitidine (ZANTAC) 150 MG tablet Take 150 mg by mouth as needed for heartburn.  . [DISCONTINUED] atorvastatin (LIPITOR) 80 MG tablet Take 1 tablet (80 mg total) by mouth daily.  . [DISCONTINUED] QUEtiapine (SEROQUEL) 25 MG tablet TAKE 1 TABLET (25 MG TOTAL) BY MOUTH AT BEDTIME.  . [DISCONTINUED] ENSURE (ENSURE) Take 237 mLs by mouth daily as needed (nutrition).   No facility-administered encounter medications on file as of 06/26/2018.     Activities of Daily Living In your present state of health, do you have any difficulty performing the following activities: 06/26/2018 10/05/2017  Hearing? Tempie Donning  Vision? N N  Difficulty concentrating or making decisions? Y N  Walking or climbing stairs? Y Y  Dressing or bathing? N Y  Doing errands, shopping? Tempie Donning  Some recent data might be hidden    Patient Care Team: [name], NP as PCP - General (Family Medicine) Nahser, Wonda Cheng, MD as PCP - Cardiology (Cardiology) Rod Can, MD as Consulting Physician (Orthopedic Surgery) Jarome Matin, MD as Consulting Physician (Dermatology) Nickie Retort, MD as Consulting Physician (Urology) Shon Hough, MD as Consulting Physician (Ophthalmology)   Assessment:   This is a routine wellness examination for Herbert Marquez.  Exercise Activities and Dietary recommendations  15-20 mins per day- home exercise regime: Stretching Banded exercise  Fall Risk Fall Risk  06/26/2018 02/20/2018 11/18/2017 06/20/2017 04/23/2017  Falls in the past year? 0 0 No No No  Number falls in past yr: - - - - -  Injury with Fall? - - - - -   Is the patient's home free of loose throw rugs in walkways, pet beds, electrical cords, etc?   yes  Grab bars in the bathroom? yes      Handrails on  the stairs?   yes      Adequate lighting?   yes  Timed Get Up and Go Performed: N/A-encounter not performed in clinic  Depression Screen PHQ 2/9 Scores 06/26/2018 02/20/2018 01/20/2018 03/28/2017  PHQ - 2 Score 1 0 1 1  PHQ- 9 Score 11 - 4 5    Cognitive Function-WNL, HOH     6CIT Screen 06/26/2018 03/28/2017  What Year? 0 points 0 points  What month? 0 points 0 points  What time? 0 points 0 points  Count back from 20 0 points 0 points  Months in reverse 0 points 0 points  Repeat phrase 2 points 0 points  Total Score 2 0    Immunization History  Administered Date(s) Administered  . Influenza, High Dose Seasonal PF 12/17/2017  . Influenza-Unspecified 11/03/2012, 12/20/2016  . Pneumococcal-Unspecified 12/03/2013    Qualifies for Shingles Vaccine? yes  Screening Tests Health Maintenance  Topic Date Due  . TETANUS/TDAP  08/23/1946  . PNA vac Low Risk Adult (2 of 2 - PCV13) 12/04/2014  . FOOT EXAM  07/02/2017  . URINE MICROALBUMIN  07/02/2017  . OPHTHALMOLOGY EXAM  12/07/2017  . HEMOGLOBIN A1C  04/06/2018  . INFLUENZA VACCINE  10/04/2018   Cancer Screenings: Lung: Low Dose CT Chest recommended if Age 68-80 years, 30 pack-year currently smoking OR have quit w/in 15years. Patient does not qualify. Colorectal: N/A, due to age  Additional Screenings:  Hepatitis C Screening: Pt declined       Plan:  Continue medications as directed  Remain well hydrated, follow Heart Healthy Diet Continue regular exercise Continue regular f/u with Neurology as directed Continue to self isolate per COVID-19 Stay at Home orders  I have personally reviewed and noted the following in the patient's chart:   . Medical and social history . Use of alcohol, tobacco or illicit drugs  . Current medications and supplements . Functional ability and status . Nutritional status . Physical activity . Advanced directives . List of other physicians . Hospitalizations, surgeries, and ER visits in  previous 12 months . Vitals . Screenings to include cognitive, depression, and falls . Referrals and appointments  In addition, I have reviewed and discussed with patient certain preventive protocols, quality metrics, and best practice recommendations. A written personalized care plan for preventive services as well as general preventive health recommendations were provided to patient.   The patient was advised to call back or seek an in-person evaluation if the symptoms worsen or if the condition fails to improve as anticipated.  I provided 20 minutes of non-face-to-face time during this encounter.   [name], NP  06/26/2018

## 2018-06-26 ENCOUNTER — Encounter: Payer: Self-pay | Admitting: Adult Health

## 2018-06-26 ENCOUNTER — Other Ambulatory Visit: Payer: Self-pay

## 2018-06-26 ENCOUNTER — Ambulatory Visit (INDEPENDENT_AMBULATORY_CARE_PROVIDER_SITE_OTHER): Payer: Medicare Other | Admitting: Adult Health

## 2018-06-26 VITALS — BP 133/80 | HR 76 | Temp 96.2°F | Ht 67.0 in | Wt 170.1 lb

## 2018-06-26 DIAGNOSIS — E118 Type 2 diabetes mellitus with unspecified complications: Secondary | ICD-10-CM

## 2018-06-26 DIAGNOSIS — N182 Chronic kidney disease, stage 2 (mild): Secondary | ICD-10-CM | POA: Diagnosis not present

## 2018-06-26 DIAGNOSIS — Z0000 Encounter for general adult medical examination without abnormal findings: Secondary | ICD-10-CM | POA: Diagnosis not present

## 2018-06-26 DIAGNOSIS — D509 Iron deficiency anemia, unspecified: Secondary | ICD-10-CM | POA: Diagnosis not present

## 2018-06-26 DIAGNOSIS — E785 Hyperlipidemia, unspecified: Secondary | ICD-10-CM | POA: Diagnosis not present

## 2018-06-26 DIAGNOSIS — R4182 Altered mental status, unspecified: Secondary | ICD-10-CM | POA: Diagnosis not present

## 2018-06-26 DIAGNOSIS — I10 Essential (primary) hypertension: Secondary | ICD-10-CM

## 2018-06-26 DIAGNOSIS — Z23 Encounter for immunization: Secondary | ICD-10-CM | POA: Diagnosis not present

## 2018-06-26 MED ORDER — ATORVASTATIN CALCIUM 80 MG PO TABS: 80.0000 mg | ORAL_TABLET | Freq: Every day | ORAL | 2 refills | Status: DC

## 2018-06-26 MED ORDER — TETANUS-DIPHTH-ACELL PERTUSSIS 5-2.5-18.5 LF-MCG/0.5 IM SUSP: 0.5000 mL | Freq: Once | INTRAMUSCULAR | 0 refills | Status: AC

## 2018-06-26 MED ORDER — QUETIAPINE FUMARATE 25 MG PO TABS: 25.0000 mg | ORAL_TABLET | Freq: Every day | ORAL | 2 refills | Status: DC

## 2018-06-26 NOTE — Addendum Note (Signed)
Addended by: Fonnie Mu on: 06/26/2018 10:10 AM   Modules accepted: Orders

## 2018-06-26 NOTE — Assessment & Plan Note (Signed)
Continue medications as directed  Remain well hydrated, follow Heart Healthy Diet Continue regular exercise Continue regular f/u with Neurology as directed Continue to self isolate per COVID-19 Stay at Home orders  I have personally reviewed and noted the following in the patient's chart:   . Medical and social history . Use of alcohol, tobacco or illicit drugs  . Current medications and supplements . Functional ability and status . Nutritional status . Physical activity . Advanced directives . List of other physicians . Hospitalizations, surgeries, and ER visits in previous 12 months . Vitals . Screenings to include cognitive, depression, and falls . Referrals and appointments  In addition, I have reviewed and discussed with patient certain preventive protocols, quality metrics, and best practice recommendations. A written personalized care plan for preventive services as well as general preventive health recommendations were provided to patient.   The patient was advised to call back or seek an in-person evaluation if the symptoms worsen or if the condition fails to improve as anticipated.

## 2018-07-07 ENCOUNTER — Ambulatory Visit: Payer: Medicare Other | Admitting: Physical Medicine & Rehabilitation

## 2018-07-07 ENCOUNTER — Ambulatory Visit: Payer: Medicare Other

## 2018-07-16 ENCOUNTER — Other Ambulatory Visit: Payer: Medicare Other

## 2018-07-18 ENCOUNTER — Encounter: Payer: Medicare Other | Attending: Physical Medicine & Rehabilitation | Admitting: Physical Medicine & Rehabilitation

## 2018-07-18 ENCOUNTER — Other Ambulatory Visit: Payer: Self-pay

## 2018-07-18 ENCOUNTER — Encounter: Payer: Self-pay | Admitting: Physical Medicine & Rehabilitation

## 2018-07-18 VITALS — BP 148/83 | HR 76 | Ht 67.0 in | Wt 170.0 lb

## 2018-07-18 DIAGNOSIS — M1711 Unilateral primary osteoarthritis, right knee: Secondary | ICD-10-CM | POA: Insufficient documentation

## 2018-07-18 NOTE — Progress Notes (Signed)
Knee injection   Indication:Right Knee pain not relieved by medication management and other conservative care.  Last injection in December 2019, elevated Covid 19 risk score pt and wife very insistent that pt needs injection  Informed consent was obtained after describing risks and benefits of the procedure with the patient, this includes bleeding, bruising, infection and medication side effects. The patient wishes to proceed and has given written consent. The patient was placed in a recumbent position. The medial aspect of the knee was marked and prepped with Betadine and alcohol. It was then entered with a 25-gauge 1-1/2 inch needle was inserted into the knee joint. After negative draw back for blood, a solution containing 5 ML of 32mg  Zilretta were injected. The patient tolerated the procedure well. Post procedure instructions were given.

## 2018-07-22 ENCOUNTER — Encounter: Payer: Medicare Other | Admitting: Adult Health

## 2018-07-26 ENCOUNTER — Other Ambulatory Visit: Payer: Self-pay | Admitting: Internal Medicine

## 2018-08-01 ENCOUNTER — Encounter: Payer: Medicare Other | Admitting: Nurse Practitioner

## 2018-09-09 ENCOUNTER — Ambulatory Visit (INDEPENDENT_AMBULATORY_CARE_PROVIDER_SITE_OTHER): Payer: Medicare Other | Admitting: *Deleted

## 2018-09-09 DIAGNOSIS — I441 Atrioventricular block, second degree: Secondary | ICD-10-CM

## 2018-09-09 LAB — CUP PACEART REMOTE DEVICE CHECK
Battery Remaining Longevity: 127 mo
Battery Remaining Percentage: 95.5 %
Battery Voltage: 3.01 V
Brady Statistic AP VP Percent: 18 %
Brady Statistic AP VS Percent: 8.8 %
Brady Statistic AS VP Percent: 1.8 %
Brady Statistic AS VS Percent: 71 %
Brady Statistic RA Percent Paced: 26 %
Brady Statistic RV Percent Paced: 21 %
Date Time Interrogation Session: 20200707091902
Implantable Lead Implant Date: 20180821
Implantable Lead Implant Date: 20180821
Implantable Lead Location: 753859
Implantable Lead Location: 753860
Implantable Pulse Generator Implant Date: 20180821
Lead Channel Impedance Value: 450 Ohm
Lead Channel Impedance Value: 530 Ohm
Lead Channel Pacing Threshold Amplitude: 0.625 V
Lead Channel Pacing Threshold Amplitude: 0.875 V
Lead Channel Pacing Threshold Pulse Width: 0.5 ms
Lead Channel Pacing Threshold Pulse Width: 0.5 ms
Lead Channel Sensing Intrinsic Amplitude: 12 mV
Lead Channel Sensing Intrinsic Amplitude: 4 mV
Lead Channel Setting Pacing Amplitude: 1.125
Lead Channel Setting Pacing Amplitude: 1.625
Lead Channel Setting Pacing Pulse Width: 0.5 ms
Lead Channel Setting Sensing Sensitivity: 2 mV
Pulse Gen Model: 2272
Pulse Gen Serial Number: 8933379

## 2018-09-18 ENCOUNTER — Ambulatory Visit: Payer: Medicare Other | Admitting: Physical Medicine & Rehabilitation

## 2018-09-19 ENCOUNTER — Encounter: Payer: Medicare Other | Admitting: Physical Medicine & Rehabilitation

## 2018-09-20 NOTE — Progress Notes (Signed)
Remote pacemaker transmission.   

## 2018-10-08 ENCOUNTER — Telehealth: Payer: Self-pay

## 2018-10-08 NOTE — Telephone Encounter (Signed)
Called and went over medications and pharmacy and verified virtual visit date and time.

## 2018-10-08 NOTE — Telephone Encounter (Signed)
Pt has given verbal consent for phone call with Dr. Acie Fredrickson tomorrow. Pt is aware to get vitals before visit, they are having some issues with current bp cuff but will try to see if it works.   YOUR CARDIOLOGY TEAM HAS ARRANGED FOR AN E-VISIT FOR YOUR APPOINTMENT - PLEASE REVIEW IMPORTANT INFORMATION BELOW SEVERAL DAYS PRIOR TO YOUR APPOINTMENT  Due to the recent COVID-19 pandemic, we are transitioning in-person office visits to tele-medicine visits in an effort to decrease unnecessary exposure to our patients, their families, and staff. These visits are billed to your insurance just like a normal visit is. We also encourage you to sign up for MyChart if you have not already done so. You will need a smartphone if possible. For patients that do not have this, we can still complete the visit using a regular telephone but do prefer a smartphone to enable video when possible. You may have a family member that lives with you that can help. If possible, we also ask that you have a blood pressure cuff and scale at home to measure your blood pressure, heart rate and weight prior to your scheduled appointment. Patients with clinical needs that need an in-person evaluation and testing will still be able to come to the office if absolutely necessary. If you have any questions, feel free to call our office.     YOUR PROVIDER WILL BE USING THE FOLLOWING PLATFORM TO COMPLETE YOUR VISIT: [email or web address] . IF USING MYCHART - How to Download the MyChart App to Your SmartPhone   - If Apple, go to [employer] and type in MyChart in the search bar and download the app. If Android, ask patient to go to [employer] and type in [place] in the search bar and download the app. The app is free but as with any other app downloads, your phone may require you to verify saved payment information or Apple/Android password.  - You will need to then log into the app with your MyChart username and password, and select [facility] as your  healthcare provider to link the account.  - When it is time for your visit, go to the MyChart app, find appointments, and click Begin Video Visit. Be sure to Select Allow for your device to access the Microphone and Camera for your visit. You will then be connected, and your provider will be with you shortly.  **If you have any issues connecting or need assistance, please contact MyChart service desk [phone])**  **If using a computer, in order to ensure the best quality for your visit, you will need to use either of the following Internet Browsers: Insurance underwriter or [employer]**  . IF USING DOXIMITY or [email or web address].ME - The staff will give you instructions on receiving your link to join the meeting the day of your visit.      2-3 DAYS BEFORE YOUR APPOINTMENT  You will receive a telephone call from one of our [facility] team members - your caller ID may say "Unknown caller." If this is a video visit, we will walk you through how to get the video launched on your phone. We will remind you check your blood pressure, heart rate and weight prior to your scheduled appointment. If you have an Apple Watch or Kardia, please upload any pertinent ECG strips the day before or morning of your appointment to [facility]. Our staff will also make sure you have reviewed the consent and agree to move forward with your scheduled tele-health visit.  THE DAY OF YOUR APPOINTMENT  Approximately 15 minutes prior to your scheduled appointment, you will receive a telephone call from one of [facility] team - your caller ID may say "Unknown caller."  Our staff will confirm medications, vital signs for the day and any symptoms you may be experiencing. Please have this information available prior to the time of visit start. It may also be helpful for you to have a pad of paper and pen handy for any instructions given during your visit. They will also walk you through joining the smartphone meeting if this is a  video visit.    CONSENT FOR TELE-HEALTH VISIT - PLEASE REVIEW  I hereby voluntarily request, consent and authorize [facility] and its employed or contracted physicians, physician assistants, nurse practitioners or other licensed health care professionals (the Practitioner), to provide me with telemedicine health care services (the "Services") as deemed necessary by the treating Practitioner. I acknowledge and consent to receive the Services by the Practitioner via telemedicine. I understand that the telemedicine visit will involve communicating with the Practitioner through live audiovisual communication technology and the disclosure of certain medical information by electronic transmission. I acknowledge that I have been given the opportunity to request an in-person assessment or other available alternative prior to the telemedicine visit and am voluntarily participating in the telemedicine visit.  I understand that I have the right to withhold or withdraw my consent to the use of telemedicine in the course of my care at any time, without affecting my right to future care or treatment, and that the Practitioner or I may terminate the telemedicine visit at any time. I understand that I have the right to inspect all information obtained and/or recorded in the course of the telemedicine visit and may receive copies of available information for a reasonable fee.  I understand that some of the potential risks of receiving the Services via telemedicine include:  [name] Delay or interruption in medical evaluation due to technological equipment failure or disruption; . Information transmitted may not be sufficient (e.g. poor resolution of images) to allow for appropriate medical decision making by the Practitioner; and/or  . In rare instances, security protocols could fail, causing a breach of personal health information.  Furthermore, I acknowledge that it is my responsibility to provide information about my  medical history, conditions and care that is complete and accurate to the best of my ability. I acknowledge that Practitioner's advice, recommendations, and/or decision may be based on factors not within their control, such as incomplete or inaccurate data provided by me or distortions of diagnostic images or specimens that may result from electronic transmissions. I understand that the practice of medicine is not an exact science and that Practitioner makes no warranties or guarantees regarding treatment outcomes. I acknowledge that I will receive a copy of this consent concurrently upon execution via email to the email address I last provided but may also request a printed copy by calling the office of [facility].    I understand that my insurance will be billed for this visit.   I have read or had this consent read to me. . I understand the contents of this consent, which adequately explains the benefits and risks of the Services being provided via telemedicine.  . I have been provided ample opportunity to ask questions regarding this consent and the Services and have had my questions answered to my satisfaction. . I give my informed consent for the services to be provided through the  use of telemedicine in my medical care  By participating in this telemedicine visit I agree to the above.

## 2018-10-09 ENCOUNTER — Telehealth (INDEPENDENT_AMBULATORY_CARE_PROVIDER_SITE_OTHER): Payer: Medicare Other | Admitting: Cardiovascular Disease

## 2018-10-09 ENCOUNTER — Other Ambulatory Visit: Payer: Self-pay

## 2018-10-09 ENCOUNTER — Encounter: Payer: Self-pay | Admitting: Cardiovascular Disease

## 2018-10-09 VITALS — BP 135/88 | HR 71 | Ht 67.0 in | Wt 166.8 lb

## 2018-10-09 DIAGNOSIS — I10 Essential (primary) hypertension: Secondary | ICD-10-CM | POA: Diagnosis not present

## 2018-10-09 DIAGNOSIS — I480 Paroxysmal atrial fibrillation: Secondary | ICD-10-CM | POA: Diagnosis not present

## 2018-10-09 DIAGNOSIS — Z7189 Other specified counseling: Secondary | ICD-10-CM

## 2018-10-09 DIAGNOSIS — I2510 Atherosclerotic heart disease of native coronary artery without angina pectoris: Secondary | ICD-10-CM | POA: Diagnosis not present

## 2018-10-09 DIAGNOSIS — E782 Mixed hyperlipidemia: Secondary | ICD-10-CM

## 2018-10-09 NOTE — Patient Instructions (Signed)
Medication Instructions:  Your physician recommends that you continue on your current medications as directed. Please refer to the Current Medication list given to you today.  If you need a refill on your cardiac medications before your next appointment, please call your pharmacy.   Lab work: Your physician recommends that you have a FASTING lipid profile, liver function panel, complete blood count, and basic metabolic panel in 6 months when you return for follow up  If you have labs (blood work) drawn today and your tests are completely normal, you will receive your results only by: [name] MyChart Message (if you have MyChart) OR . A paper copy in the mail If you have any lab test that is abnormal or we need to change your treatment, we will call you to review the results.  Testing/Procedures: None ordered  Follow-Up: At [facility], you and your health needs are our priority.  As part of our continuing mission to provide you with exceptional heart care, we have created designated Provider Care Teams.  These Care Teams include your primary Cardiologist (physician) and Advanced Practice Providers (APPs -  Physician Assistants and Nurse Practitioners) who all work together to provide you with the care you need, when you need it. You will need a follow up appointment in:  6 months.  Please call our office 2 months in advance to schedule this appointment.  You may see [name], MD or one of the following Advanced Practice Providers on your designated Care Team: Richardson Dopp, PA-C [place], [place] . Daune Perch, NP  Any Other Special Instructions Will Be Listed Below (If Applicable).
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
## 2018-10-27 ENCOUNTER — Encounter: Payer: Self-pay | Admitting: Physical Medicine & Rehabilitation

## 2018-10-27 ENCOUNTER — Encounter: Payer: Medicare Other | Attending: Physical Medicine & Rehabilitation | Admitting: Physical Medicine & Rehabilitation

## 2018-10-27 ENCOUNTER — Other Ambulatory Visit: Payer: Self-pay

## 2018-10-27 VITALS — BP 143/84 | HR 77 | Temp 95.0°F | Ht 68.0 in | Wt 168.0 lb

## 2018-10-27 DIAGNOSIS — I69359 Hemiplegia and hemiparesis following cerebral infarction affecting unspecified side: Secondary | ICD-10-CM

## 2018-10-27 DIAGNOSIS — I2510 Atherosclerotic heart disease of native coronary artery without angina pectoris: Secondary | ICD-10-CM

## 2018-10-27 DIAGNOSIS — M1711 Unilateral primary osteoarthritis, right knee: Secondary | ICD-10-CM | POA: Insufficient documentation

## 2018-10-27 DIAGNOSIS — I69398 Other sequelae of cerebral infarction: Secondary | ICD-10-CM | POA: Diagnosis not present

## 2018-10-27 NOTE — Patient Instructions (Signed)
Knee Injection A knee injection is a procedure to get medicine into your knee joint to relieve the pain, swelling, and stiffness of arthritis. Your health care provider uses a needle to inject medicine, which may also help to lubricate and cushion your knee joint. You may need more than one injection. Tell a health care provider about:  Any allergies you have.  All medicines you are taking, including vitamins, herbs, eye drops, creams, and over-the-counter medicines.  Any problems you or family members have had with anesthetic medicines.  Any blood disorders you have.  Any surgeries you have had.  Any medical conditions you have.  Whether you are pregnant or may be pregnant. What are the risks? Generally, this is a safe procedure. However, problems may occur, including:  Infection.  Bleeding.  Symptoms that get worse.  Damage to the area around your knee.  Allergic reaction to any of the medicines.  Skin reactions from repeated injections. What happens before the procedure?  Ask your health care provider about changing or stopping your regular medicines. This is especially important if you are taking diabetes medicines or blood thinners.  Plan to have someone take you home from the hospital or clinic. What happens during the procedure?   You will sit or lie down in a position for your knee to be treated.  The skin over your kneecap will be cleaned with a germ-killing soap.  You will be given a medicine that numbs the area (local anesthetic). You may feel some stinging.  The medicine will be injected into your knee. The needle is carefully placed between your kneecap and your knee. The medicine is injected into the joint space.  The needle will be removed at the end of the procedure.  A bandage (dressing) may be placed over the injection site. The procedure may vary among health care providers and hospitals. What can I expect after the procedure?  Your blood  pressure, heart rate, breathing rate, and blood oxygen level will be monitored until you leave the hospital or clinic.  You may have to move your knee through its full range of motion. This helps to get all the medicine into your joint space.  You will be watched to make sure that you do not have a reaction to the injected medicine.  You may feel more pain, swelling, and warmth than you did before the injection. This reaction may last about 1-2 days. Follow these instructions at home: Medicines  Take over-the-counter and prescription medicines only as told by your doctor.  Do not drive or use heavy machinery while taking prescription pain medicine.  Do not take medicines such as aspirin and ibuprofen unless your health care provider tells you to take them. Injection site care  Follow instructions from your health care provider about: ? How to take care of your puncture site. ? When and how you should change your dressing. ? When you should remove your dressing.  Check your injection area every day for signs of infection. Check for: ? More redness, swelling, or pain after 2 days. ? Fluid or blood. ? Pus or a bad smell. ? Warmth. Managing pain, stiffness, and swelling   If directed, put ice on the injection area: ? Put ice in a plastic bag. ? Place a towel between your skin and the bag. ? Leave the ice on for 20 minutes, 2-3 times per day.  Do not apply heat to your knee.  Raise (elevate) the injection area above the level   of your heart while you are sitting or lying down. General instructions  If you were given a dressing, keep it dry until your health care provider says it can be removed. Ask your health care provider when you can start showering or taking a bath.  Avoid strenuous activities for as long as directed by your health care provider. Ask your health care provider when you can return to your normal activities.  Keep all follow-up visits as told by your health  care provider. This is important. You may need more injections. Contact a health care provider if you have:  A fever.  Warmth in your injection area.  Fluid, blood, or pus coming from your injection site.  Symptoms at your injection site that last longer than 2 days after your procedure. Get help right away if:  Your knee: ? Turns very red. ? Becomes very swollen. ? Is in severe pain. Summary  A knee injection is a procedure to get medicine into your knee joint to relieve the pain, swelling, and stiffness of arthritis.  A needle is carefully placed between your kneecap and your knee to inject medicine into the joint space.  Before the procedure, ask your health care provider about changing or stopping your regular medicines, especially if you are taking diabetes medicines or blood thinners.  Contact your health care provider if you have any problems or questions after your procedure. This information is not intended to replace advice given to you by your health care provider. Make sure you discuss any questions you have with your health care provider. Document Released: 05/13/2006 Document Revised: 03/11/2017 Document Reviewed: 03/11/2017 Elsevier Patient Education  2020 [employer]  

## 2018-10-27 NOTE — Progress Notes (Signed)
Subjective:    Patient ID: Herbert Marquez, male    DOB: [birth year], 85 y.o.   MRN: [id number]  HPI Right pain, hx of OA.  Had RIght knee injection 07/18/2018 and only started wearing off ~2wks ago Used Zilretta last visit but duration of response was not longer than Celestone  Knee pain is inhibiting ambulation.  The patient normally likes to walk at least 180 feet per day. In addition he is performing home exercise program with upper extremity and lower extremity exercises using Thera bands 15 to 20 minutes every morning.  Voltaren gel pt uses only once a day Pain Inventory Average Pain 7 Pain Right Now 7 My pain is intermittent and aching  In the last 24 hours, has pain interfered with the following? General activity 5 Relation with others 2 Enjoyment of life 4 What TIME of day is your pain at its worst? daytime Sleep (in general) Fair  Pain is worse with: walking, inactivity and some activites Pain improves with: medication and injections Relief from Meds: 7  Mobility walk with assistance use a walker how many minutes can you walk? 15 ability to climb steps?  no do you drive?  no use a wheelchair transfers alone  Function retired  Neuro/Psych bladder control problems weakness trouble walking dizziness depression  Prior Studies Any changes since last visit?  no  Physicians involved in your care Any changes since last visit?  no Primary care K. [facility]   Family History  Problem Relation Age of Onset   Hypertension Mother    Lung cancer Father    Lung cancer Brother    Social History   Socioeconomic History   Marital status: Married    Spouse name: Not on file   Number of children: Not on file   Years of education: Not on file   Highest education level: Not on file  Occupational History   Occupation: Retired    Fish farm manager: [place] resource strain: Not on file   Food insecurity    Worry: Not on file   Inability: Not on file   Transportation needs    Medical: Not on file    Non-medical: Not on file  Tobacco Use   Smoking status: Former Smoker    Quit date: 03/06/1971    Years since quitting: 47.6   Smokeless tobacco: Never Used  Substance and Sexual Activity   Alcohol use: Yes    Alcohol/week: 8.0 standard drinks    Types: 7 Glasses of wine, 1 Cans of beer per week    Comment: 1/2 BEER AND 1 WINE   Drug use: No   Sexual activity: Never  Lifestyle   Physical activity    Days per week: Not on file    Minutes per session: Not on file   Stress: Not on file  Relationships   Social connections    Talks on phone: Not on file    Gets together: Not on file    Attends religious service: Not on file    Active member of club or organization: Not on file    Attends meetings of clubs or organizations: Not on file    Relationship status: Not on file  Other Topics Concern   Not on file  Social History Narrative   Lives in [place], [place] with wife. Has 3 children.    Past Surgical History:  Procedure Laterality Date   CARDIOVASCULAR STRESS TEST  07/01/2007   EF 74%  CATARACT EXTRACTION, BILATERAL     CORONARY ANGIOPLASTY WITH STENT PLACEMENT  10/2004   stenting x 2 to RCA   FEMUR IM NAIL Right 11/26/2015   Procedure: INTRAMEDULLARY RIGHT (IM) NAIL FEMORAL;  Surgeon: Rod Can, MD;  Location: [facility];  Service: Orthopedics;  Laterality: Right;   FRACTURE SURGERY     HERNIA REPAIR     HIP ARTHROPLASTY  03/09/2011   Procedure: ARTHROPLASTY BIPOLAR HIP;  Surgeon: Mauri Pole;  Location: [facility];  Service: Orthopedics;  Laterality: Left;   INSERT / REPLACE / REMOVE PACEMAKER  10/23/2016   LAPAROSCOPIC INCISIONAL / UMBILICAL / VENTRAL HERNIA REPAIR     "below his naval"   PACEMAKER IMPLANT N/A 10/23/2016   Procedure: Pacemaker Implant;  Surgeon: Thompson Grayer, MD;  Location: [facility];  Service: Cardiovascular;  Laterality: N/A;   Past Medical  History:  Diagnosis Date   Acute blood loss anemia    Acute encephalopathy 06/04/2016   Acute ischemic stroke ([place]) - L temporal lobe s/p tPA 03/30/2016   Altered mental status    Arthritis    "pretty much all over"    Ataxia due to recent stroke 06/23/2015   Atrial fibrillation with rapid ventricular response ([place])    Basal cell carcinoma    "several burned off his body, face, head"   Benign essential HTN    BPH (benign prostatic hypertrophy)    Cerebral hemorrhage (HCC) w/ SDH s/p IV tPA    Cerebral thrombosis with cerebral infarction 10/05/2017   Cerebrovascular accident (CVA) ([place]) 09/18/2015   Chronic anticoagulation    CKD (chronic kidney disease), stage II 11/25/2015   COPD (chronic obstructive pulmonary disease) ([place]) 03/09/2011   Coronary artery disease    a. s/p PCI of RCA in 2006   Coronary artery disease involving native coronary artery of native heart without angina pectoris    CVA (cerebral infarction)    a. 06/2015: left thalamic and bilateral PCA   Dementia without behavioral disturbance ([place])    Dysphagia as late effect of cerebrovascular disease    Embolic stroke ([place]) [id number]   Expressive aphasia 10/04/2017   Gait disturbance, post-stroke 06/23/2015   GERD (gastroesophageal reflux disease)    Hemiparesis and other late effects of cerebrovascular accident ([place]) 06/29/2016   History of stroke 04/04/2016   HLD (hyperlipidemia)    Hyperlipidemia    Hypertension    Ischemic stroke ([place])    Low blood pressure reading 10/14/2017   Orthostatic hypotension    PAF (paroxysmal atrial fibrillation) (HCC)    Presence of permanent cardiac pacemaker    Right hemiparesis ([place])    Second degree Mobitz II AV block 10/23/2016   Seizures ([place])    Stroke ([place])    Thalamic infarction ([place]) 06/21/2015   TIA (transient ischemic attack)    Approximately 6 weeks post-cardiac catheterization.    Type 2 diabetes mellitus with complication, without  long-term current use of insulin (HCC)    Vascular dementia with behavior disturbance (HCC) 06/29/2016   BP (!) 143/84    Pulse 77    Temp (!) 95 F (35 C)    Ht 5\' 8"  (1.727 m) Comment: pt reported, in wheelchair   Wt 168 lb (76.2 kg) Comment: pt reported, in wheelchair   SpO2 95%    BMI 25.54 kg/m   Opioid Risk Score:   Fall Risk Score:  `1  Depression screen [facility] 2/9  Depression screen [facility] 2/9 06/26/2018 02/20/2018 01/20/2018 03/28/2017 01/29/2017 01/07/2017 06/29/2016  Decreased Interest 0 0 1 1 2  0 1  Down, Depressed, Hopeless 1 0 0 0 2 0 1  PHQ - 2 Score 1 0 1 1 4  0 2  Altered sleeping 3 - 0 0 0 - 0  Tired, decreased energy 3 - 1 1 2  - 2  Change in appetite 0 - 0 0 1 - 0  Feeling bad or failure about yourself  1 - 1 1 2  - 1  Trouble concentrating 0 - 1 1 2  - 2  Moving slowly or fidgety/restless 3 - 0 1 1 - 0  Suicidal thoughts 0 - 0 0 1 - 0  PHQ-9 Score 11 - 4 5 13  - 7  Difficult doing work/chores Not difficult at all - Somewhat difficult Somewhat difficult Somewhat difficult - Somewhat difficult  Some recent data might be hidden    Review of Systems  Constitutional: Negative.   HENT: Negative.   Eyes: Negative.   Respiratory: Negative.   Cardiovascular: Negative.   Gastrointestinal: Negative.   Endocrine: Negative.   Genitourinary: Positive for dysuria.  Musculoskeletal: Negative.   Skin: Negative.   Allergic/Immunologic: Negative.   Neurological: Positive for dizziness and weakness.  Hematological: Negative.   Psychiatric/Behavioral: Positive for dysphoric mood.  All other systems reviewed and are negative.      Objective:   Physical Exam Vitals signs and nursing note reviewed.  Constitutional:      Appearance: Normal appearance.  HENT:     Head: Normocephalic and atraumatic.  Eyes:     Extraocular Movements: Extraocular movements intact.     Conjunctiva/sclera: Conjunctivae normal.     Pupils: Pupils are equal, round, and reactive to light.    Musculoskeletal:     Right knee: He exhibits normal range of motion, no swelling, no effusion and no ecchymosis. Tenderness found. Medial joint line tenderness noted.  Skin:    General: Skin is warm and dry.  Neurological:     Mental Status: He is alert and oriented to person, place, and time.     Comments: Motor strength is 4/5 in the right hip flexor knee extensor ankle dorsiflexor Sensation is mildly reduced on the right side compared to left side he does have extinction with double simultaneous stimulation of the upper extremities to light touch.  Psychiatric:        Mood and Affect: Mood normal.        Behavior: Behavior normal.           Assessment & Plan:  1.  History of left temporal hemorrhage with residual right hemiparesis and right hemisensory deficits.  We discussed the importance of continuing his home exercise program.  He does sit to stand exercises as well as Thera-Band exercises.  2.  Right knee osteoarthritis this is inhibiting his home exercise program.  He gets equal amounts of relief from the Celestone is with the Zilretta.  Therefore we will continue with the Celestone  Knee injection Right   Indication:Right Knee osteoarthritis pain not relieved by medication management and other conservative care.  Informed consent was obtained after describing risks and benefits of the procedure with the patient, this includes bleeding, bruising, infection and medication side effects. The patient wishes to proceed and has given written consent. The patient was placed in a recumbent position. The medial aspect of the knee was marked and prepped with Betadine and alcohol.Vapocoolant spray was applied. Then a 1.5 in 25g needle was inserted into the knee joint. After negative draw back for  blood, a solution containing one ML of 6mg  per mL betamethasone and 3 mL of 1% lidocaine were injected. The patient tolerated the procedure well. Post procedure instructions were given.

## 2018-11-05 DIAGNOSIS — R350 Frequency of micturition: Secondary | ICD-10-CM | POA: Diagnosis not present

## 2018-11-05 DIAGNOSIS — R351 Nocturia: Secondary | ICD-10-CM | POA: Diagnosis not present

## 2018-11-05 DIAGNOSIS — N390 Urinary tract infection, site not specified: Secondary | ICD-10-CM | POA: Diagnosis not present

## 2018-11-05 DIAGNOSIS — N401 Enlarged prostate with lower urinary tract symptoms: Secondary | ICD-10-CM | POA: Diagnosis not present

## 2018-11-26 ENCOUNTER — Other Ambulatory Visit: Payer: Self-pay

## 2018-11-26 ENCOUNTER — Encounter: Payer: Self-pay | Admitting: Internal Medicine

## 2018-11-26 ENCOUNTER — Telehealth (INDEPENDENT_AMBULATORY_CARE_PROVIDER_SITE_OTHER): Payer: Medicare Other | Admitting: Internal Medicine

## 2018-11-26 VITALS — BP 115/76 | Ht 68.0 in | Wt 170.0 lb

## 2018-11-26 DIAGNOSIS — I2510 Atherosclerotic heart disease of native coronary artery without angina pectoris: Secondary | ICD-10-CM | POA: Diagnosis not present

## 2018-11-26 DIAGNOSIS — I480 Paroxysmal atrial fibrillation: Secondary | ICD-10-CM | POA: Diagnosis not present

## 2018-11-26 DIAGNOSIS — I442 Atrioventricular block, complete: Secondary | ICD-10-CM | POA: Diagnosis not present

## 2018-11-26 NOTE — Progress Notes (Signed)
Electrophysiology TeleHealth Note  Due to national recommendations of social distancing due to [place] 19, an audio telehealth visit is felt to be most appropriate for this patient at this time.  Verbal consent was obtained by me for the telehealth visit today.  The patient does not have capability for a virtual visit.  A phone visit is therefore required today.   Date:  11/26/2018   ID:  Herbert Marquez, DOB 1928-03-04, MRN [id number]  Location: patient's home  Provider location:  [place]  Evaluation Performed: Follow-up visit  PCP:  [name], NP   Electrophysiologist:  Dr Rayann Heman  Chief Complaint:  palpitations  History of Present Illness:    Herbert Marquez is a 85 y.o. male who presents via telehealth conferencing today.  Since last being seen in our clinic, the patient reports doing reasonably well.  He reports having some dizziness for which he has stopped flomax. He continues to enjoy going [place] the [place] at [facility] Today, he denies symptoms of palpitations, chest pain, shortness of breath,  lower extremity edema,  presyncope, or syncope.  The patient is otherwise without complaint today.  The patient denies symptoms of fevers, chills, cough, or new SOB worrisome for COVID 19.  Past Medical History:  Diagnosis Date  . Acute blood loss anemia   . Acute encephalopathy 06/04/2016  . Acute ischemic stroke ([place]) - L temporal lobe s/p tPA 03/30/2016  . Altered mental status   . Arthritis    "pretty much all over"   . 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 Atrial fibrillation with rapid ventricular response ([place])   . Basal cell carcinoma    "several burned off his body, face, head"  . Benign essential HTN   . BPH (benign prostatic hypertrophy)   . Cerebral hemorrhage (HCC) w/ SDH s/p IV tPA   . Cerebral thrombosis with cerebral infarction 10/05/2017  . Cerebrovascular accident (CVA) ([place]) 09/18/2015  . Chronic anticoagulation   . CKD (chronic kidney disease),  stage II 11/25/2015  . COPD (chronic obstructive pulmonary disease) ([place]) 03/09/2011  . Coronary artery disease    a. s/p PCI of RCA in 2006  . Coronary artery disease involving native coronary artery of native heart without angina pectoris   . CVA (cerebral infarction)    a. 06/2015: left thalamic and bilateral PCA  . Dementia without behavioral disturbance ([place])   . Dysphagia as late effect of cerebrovascular disease   . Embolic stroke ([place]) [id number]  . Expressive aphasia 10/04/2017  . Gait disturbance, post-stroke 06/23/2015  . GERD (gastroesophageal reflux disease)   . Hemiparesis and other late effects of cerebrovascular accident ([place]) 06/29/2016  . History of stroke 04/04/2016  . HLD (hyperlipidemia)   . Hyperlipidemia   . Hypertension   . Ischemic stroke ([place])   . Low blood pressure reading 10/14/2017  . Orthostatic hypotension   . PAF (paroxysmal atrial fibrillation) ([place])   . Presence of permanent cardiac pacemaker   . Right hemiparesis ([place])   . Second degree Mobitz II AV block 10/23/2016  . Seizures ([place])   . Stroke ([place])   . Thalamic infarction ([place]) 06/21/2015  . TIA (transient ischemic attack)    Approximately 6 weeks post-cardiac catheterization.   . Type 2 diabetes mellitus with complication, without long-term current use of insulin ([place])   . Vascular dementia with behavior disturbance ([place]) 06/29/2016    Past Surgical History:  Procedure Laterality Date  . CARDIOVASCULAR STRESS TEST  07/01/2007   EF 74%  . CATARACT EXTRACTION, BILATERAL    . CORONARY ANGIOPLASTY WITH STENT PLACEMENT  10/2004   stenting x 2 to RCA  . FEMUR IM NAIL Right 11/26/2015   Procedure: INTRAMEDULLARY RIGHT (IM) NAIL FEMORAL;  Surgeon: Rod Can, MD;  [facility];  Service: Orthopedics;  Laterality: Right;  . FRACTURE SURGERY    . HERNIA REPAIR    . HIP ARTHROPLASTY  03/09/2011   Procedure: ARTHROPLASTY BIPOLAR HIP;  Surgeon: Mauri Pole;  [facility];  Service: Orthopedics;   Laterality: Left;  . INSERT / REPLACE / REMOVE PACEMAKER  10/23/2016  . LAPAROSCOPIC INCISIONAL / UMBILICAL / VENTRAL HERNIA REPAIR     "below his naval"  . PACEMAKER IMPLANT N/A 10/23/2016   Procedure: Pacemaker Implant;  Surgeon: Thompson Grayer, MD;  Location: [facility];  Service: Cardiovascular;  Laterality: N/A;    Current Outpatient Medications  Medication Sig Dispense Refill  . acetaminophen (TYLENOL) 325 MG tablet Take 2 tablets (650 mg total) every 4 (four) hours as needed by mouth for mild pain (or temp > 37.5 C (99.5 F)). 30 tablet 0  . aspirin EC 81 MG tablet Take 1 tablet (81 mg total) by mouth daily.    [name] atorvastatin (LIPITOR) 80 MG tablet Take 80 mg by mouth daily.    . calcium carbonate (TUMS - DOSED IN MG ELEMENTAL CALCIUM) 500 MG chewable tablet Chew 1 tablet by mouth daily as needed for indigestion or heartburn.    [name] ELIQUIS 5 MG TABS tablet TAKE 1 TABLET (5 MG TOTAL) BY MOUTH 2 (TWO) TIMES DAILY. 180 tablet 2  . levETIRAcetam (KEPPRA) 750 MG tablet Take 1 tablet (750 mg total) by mouth 2 (two) times daily. 180 tablet 2  . Multiple Vitamin (MULTIVITAMIN WITH MINERALS) TABS tablet Take 1 tablet by mouth daily.    . nitroGLYCERIN (NITROSTAT) 0.4 MG SL tablet Place 1 tablet (0.4 mg total) under the tongue every 5 (five) minutes as needed for chest pain. 25 tablet 5  . phenylephrine-shark liver oil-mineral oil-petrolatum (PREPARATION H) 0.25-3-14-71.9 % rectal ointment Place 1 application rectally as needed for hemorrhoids.    . polyvinyl alcohol (ARTIFICIAL TEARS) 1.4 % ophthalmic solution Place 1 drop into both eyes daily as needed for dry eyes.    [name] QUEtiapine (SEROQUEL) 25 MG tablet Take 1 tablet (25 mg total) by mouth at bedtime. 90 tablet 2  . tamsulosin (FLOMAX) 0.4 MG CAPS capsule Take 0.4 mg by mouth daily.     No current facility-administered medications for this visit.     Allergies:   Patient has no known allergies.   Social History:  The patient  reports  that he quit smoking about 47 years ago. He has never used smokeless tobacco. He reports current alcohol use of about 8.0 standard drinks of alcohol per week. He reports that he does not use drugs.   Family History:  The patient's  family history includes Hypertension in his mother; Lung cancer in his brother and father.   ROS:  Please see the history of present illness.   All other systems are personally reviewed and negative.    Exam:    Vital Signs:  BP 115/76   Ht 5\' 8"  (1.727 m)   Wt 170 lb (77.1 kg)   BMI 25.85 kg/m   Well sounding, alert and conversant   Labs/Other Tests and Data Reviewed:    Recent Labs: No results found for requested labs within last  8760 hours.   Wt Readings from Last 3 Encounters:  11/26/18 170 lb (77.1 kg)  10/27/18 168 lb (76.2 kg)  10/09/18 166 lb 12.8 oz (75.7 kg)     Last device remote is reviewed from [facility] PDF which reveals normal device function, afib burden is 2.5%   ASSESSMENT & PLAN:    1.  Intermittent complete heart block Remotes are uptodate Normal device function  2. paroxymal atrial fibrillation chads2vasc score is 6.  on eliquis  Well controlled (burden 2.5%)  3. CAD No ischemic symptoms  4. Recurrent strokes On ASA and eliquis per Dr Erlinda Hong  Follow-up:  Continue remots Return to see EP APP in a year   Patient Risk:  after full review of this patients clinical status, I feel that they are at moderate risk at this time.  Today, I have spent 15 minutes with the patient with telehealth technology discussing arrhythmia management .    Army Fossa, MD  11/26/2018 11:41 AM     [facility] [address] [place] [address] [phone] (office) [phone] (fax)

## 2018-12-09 ENCOUNTER — Ambulatory Visit (INDEPENDENT_AMBULATORY_CARE_PROVIDER_SITE_OTHER): Payer: Medicare Other | Admitting: *Deleted

## 2018-12-09 DIAGNOSIS — I441 Atrioventricular block, second degree: Secondary | ICD-10-CM

## 2018-12-09 DIAGNOSIS — I480 Paroxysmal atrial fibrillation: Secondary | ICD-10-CM

## 2018-12-10 LAB — CUP PACEART REMOTE DEVICE CHECK
Battery Remaining Longevity: 125 mo
Battery Remaining Percentage: 95.5 %
Battery Voltage: 3.01 V
Brady Statistic AP VP Percent: 18 %
Brady Statistic AP VS Percent: 7.9 %
Brady Statistic AS VP Percent: 1.9 %
Brady Statistic AS VS Percent: 72 %
Brady Statistic RA Percent Paced: 25 %
Brady Statistic RV Percent Paced: 21 %
Date Time Interrogation Session: 20201006080135
Implantable Lead Implant Date: 20180821
Implantable Lead Implant Date: 20180821
Implantable Lead Location: 753859
Implantable Lead Location: 753860
Implantable Pulse Generator Implant Date: 20180821
Lead Channel Impedance Value: 450 Ohm
Lead Channel Impedance Value: 480 Ohm
Lead Channel Pacing Threshold Amplitude: 0.875 V
Lead Channel Pacing Threshold Amplitude: 1 V
Lead Channel Pacing Threshold Pulse Width: 0.5 ms
Lead Channel Pacing Threshold Pulse Width: 0.5 ms
Lead Channel Sensing Intrinsic Amplitude: 10.4 mV
Lead Channel Sensing Intrinsic Amplitude: 3.5 mV
Lead Channel Setting Pacing Amplitude: 1.125
Lead Channel Setting Pacing Amplitude: 2 V
Lead Channel Setting Pacing Pulse Width: 0.5 ms
Lead Channel Setting Sensing Sensitivity: 2 mV
Pulse Gen Model: 2272
Pulse Gen Serial Number: 8933379

## 2018-12-18 DIAGNOSIS — R351 Nocturia: Secondary | ICD-10-CM | POA: Diagnosis not present

## 2018-12-18 DIAGNOSIS — N401 Enlarged prostate with lower urinary tract symptoms: Secondary | ICD-10-CM | POA: Diagnosis not present

## 2018-12-18 NOTE — Progress Notes (Signed)
Remote pacemaker transmission.   

## 2019-01-02 ENCOUNTER — Ambulatory Visit (INDEPENDENT_AMBULATORY_CARE_PROVIDER_SITE_OTHER): Payer: Medicare Other

## 2019-01-02 ENCOUNTER — Other Ambulatory Visit: Payer: Self-pay

## 2019-01-02 VITALS — Temp 97.4°F

## 2019-01-02 DIAGNOSIS — Z23 Encounter for immunization: Secondary | ICD-10-CM

## 2019-01-02 NOTE — Progress Notes (Signed)
Pt here for influenza vaccine.  Screening questionnaire reviewed, VIS provided to patient, and any/all patient questions answered.  T. Nelson, CMA  

## 2019-01-03 ENCOUNTER — Other Ambulatory Visit: Payer: Self-pay | Admitting: Adult Health

## 2019-01-27 ENCOUNTER — Encounter: Payer: Medicare Other | Attending: Physical Medicine & Rehabilitation | Admitting: Physical Medicine & Rehabilitation

## 2019-01-27 ENCOUNTER — Encounter: Payer: Self-pay | Admitting: Physical Medicine & Rehabilitation

## 2019-01-27 ENCOUNTER — Other Ambulatory Visit: Payer: Self-pay

## 2019-01-27 VITALS — BP 120/77 | HR 97 | Temp 97.2°F | Ht 67.0 in | Wt 170.0 lb

## 2019-01-27 DIAGNOSIS — M1711 Unilateral primary osteoarthritis, right knee: Secondary | ICD-10-CM | POA: Insufficient documentation

## 2019-01-27 NOTE — Patient Instructions (Signed)
Will see you in 6 weeks to discuss options in case this injection is not very helpful

## 2019-01-27 NOTE — Progress Notes (Signed)
Knee injection Right  Indication:Right Knee pain not relieved by medication management and other conservative care.  Informed consent was obtained after describing risks and benefits of the procedure with the patient, this includes bleeding, bruising, infection and medication side effects. The patient wishes to proceed and has given written consent. The patient was placed in a recumbent position. The medial aspect of the knee was marked and prepped with Betadine and alcohol. It was then entered with a 25-gauge 1-1/2 inch needle  After negative draw back for blood, a solution containing one ML of 6mg  per mL betamethasone and 4 mL of 1% lidocaine were injected. The patient tolerated the procedure well. Post procedure instructions were given.

## 2019-02-06 ENCOUNTER — Other Ambulatory Visit: Payer: Self-pay

## 2019-02-06 ENCOUNTER — Ambulatory Visit: Payer: Medicare Other

## 2019-02-06 DIAGNOSIS — Z23 Encounter for immunization: Secondary | ICD-10-CM

## 2019-02-06 MED ORDER — SHINGRIX 50 MCG/0.5ML IM SUSR: 0.5000 mL | Freq: Once | INTRAMUSCULAR | 0 refills | Status: AC

## 2019-02-12 DIAGNOSIS — R351 Nocturia: Secondary | ICD-10-CM | POA: Diagnosis not present

## 2019-02-12 DIAGNOSIS — N401 Enlarged prostate with lower urinary tract symptoms: Secondary | ICD-10-CM | POA: Diagnosis not present

## 2019-03-10 ENCOUNTER — Encounter: Payer: Self-pay | Admitting: Physical Medicine & Rehabilitation

## 2019-03-10 ENCOUNTER — Ambulatory Visit (INDEPENDENT_AMBULATORY_CARE_PROVIDER_SITE_OTHER): Payer: Medicare Other | Admitting: *Deleted

## 2019-03-10 ENCOUNTER — Other Ambulatory Visit: Payer: Self-pay

## 2019-03-10 ENCOUNTER — Encounter: Payer: Medicare Other | Attending: Physical Medicine & Rehabilitation | Admitting: Physical Medicine & Rehabilitation

## 2019-03-10 VITALS — BP 137/82 | HR 97 | Temp 97.6°F | Ht 67.0 in | Wt 166.0 lb

## 2019-03-10 DIAGNOSIS — Z7901 Long term (current) use of anticoagulants: Secondary | ICD-10-CM | POA: Diagnosis not present

## 2019-03-10 DIAGNOSIS — I69351 Hemiplegia and hemiparesis following cerebral infarction affecting right dominant side: Secondary | ICD-10-CM

## 2019-03-10 DIAGNOSIS — M1711 Unilateral primary osteoarthritis, right knee: Secondary | ICD-10-CM

## 2019-03-10 DIAGNOSIS — I69359 Hemiplegia and hemiparesis following cerebral infarction affecting unspecified side: Secondary | ICD-10-CM

## 2019-03-10 DIAGNOSIS — I441 Atrioventricular block, second degree: Secondary | ICD-10-CM | POA: Diagnosis not present

## 2019-03-10 DIAGNOSIS — I69398 Other sequelae of cerebral infarction: Secondary | ICD-10-CM

## 2019-03-10 LAB — CUP PACEART REMOTE DEVICE CHECK
Battery Remaining Longevity: 127 mo
Battery Remaining Percentage: 95.5 %
Battery Voltage: 3.01 V
Brady Statistic AP VP Percent: 16 %
Brady Statistic AP VS Percent: 7.1 %
Brady Statistic AS VP Percent: 1.8 %
Brady Statistic AS VS Percent: 75 %
Brady Statistic RA Percent Paced: 22 %
Brady Statistic RV Percent Paced: 18 %
Date Time Interrogation Session: 20210105020023
Implantable Lead Implant Date: 20180821
Implantable Lead Implant Date: 20180821
Implantable Lead Location: 753859
Implantable Lead Location: 753860
Implantable Pulse Generator Implant Date: 20180821
Lead Channel Impedance Value: 450 Ohm
Lead Channel Impedance Value: 460 Ohm
Lead Channel Pacing Threshold Amplitude: 0.625 V
Lead Channel Pacing Threshold Amplitude: 0.875 V
Lead Channel Pacing Threshold Pulse Width: 0.5 ms
Lead Channel Pacing Threshold Pulse Width: 0.5 ms
Lead Channel Sensing Intrinsic Amplitude: 3.1 mV
Lead Channel Sensing Intrinsic Amplitude: 8.8 mV
Lead Channel Setting Pacing Amplitude: 0.875
Lead Channel Setting Pacing Amplitude: 1.875
Lead Channel Setting Pacing Pulse Width: 0.5 ms
Lead Channel Setting Sensing Sensitivity: 2 mV
Pulse Gen Model: 2272
Pulse Gen Serial Number: 8933379

## 2019-03-10 NOTE — Progress Notes (Signed)
Subjective:  Phone visit due to Covid risk score of 8  Patient ID: Herbert Marquez, male    DOB: 05/23/1927, 86 y.o.   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  He has had x-rays demonstrating moderate right knee medial joint space osteoarthritis as well as severe right knee lateral joint space osteoarthritis. He is on chronic anticoagulation The patient has had some partial relief of his discomfort after Celestone injections of the right knee.  Unfortunately his last right knee intra-articular injection performed on 01/27/2019 was not helpful. In the past he has tried Zilretta injection without any benefit as compared to Celestone.  He has also tried a Synvisc injection which was not helpful as the Celestone His pain limits his mobility.  Given his fall risk from previous CVA as well as age he is not a good candidate for narcotic analgesics.  In addition he is not a good candidate for nonsteroidal anti-inflammatories.  He only gets some partial relief with Voltaren gel Pain Inventory Average Pain 6 Pain Right Now 6 My pain is aching  In the last 24 hours, has pain interfered with the following? General activity 8 Relation with others 2 Enjoyment of life 6 What TIME of day is your pain at its worst? morning Sleep (in general) Fair  Pain is worse with: walking, inactivity and standing Pain improves with: medication Relief from Meds: 4  Mobility walk with assistance use a walker use a wheelchair needs help with transfers  Function I need assistance with the following:  dressing, meal prep, household duties and shopping  Neuro/Psych bladder control problems numbness trouble walking confusion  Prior Studies Any changes since last visit?  yes  Has seen a  urologist and on myrbetriq but not really helping   Physicians involved in your care Any changes since last visit?  no   Family History  Problem Relation Age of Onset  . Hypertension Mother   . Lung cancer Father   . Lung cancer Brother    Social History   Socioeconomic History  . Marital status: Married    Spouse name: Not on file  . Number of children: Not on file  . Years of education: Not on file  . Highest education level: Not on file  Occupational History  . Occupation: Retired    Fish farm manager: OTHER  Tobacco Use  . Smoking status: Former Smoker    Quit date: 03/06/1971    Years since quitting: 48.0  . Smokeless tobacco: Never Used  Substance and Sexual Activity  . Alcohol use: Yes    Alcohol/week: 8.0 standard drinks    Types: 7 Glasses of wine, 1 Cans of beer per week    Comment: 1/2 BEER AND 1 WINE  . Drug use: No  . Sexual activity: Never  Other Topics Concern  . Not on file  Social History Narrative   Lives in [place], [place] with wife. Has 3 children.    Social Determinants of Health   Financial Resource Strain:   . Difficulty of Paying Living Expenses: Not on file  Food Insecurity:   . Worried About Charity fundraiser in the Last Year: Not on file  . Ran Out of Food in the Last Year: Not on file  Transportation Needs:   .  Lack of Transportation (Medical): Not on file  . Lack of Transportation (Non-Medical): Not on file  Physical Activity:   . Days of Exercise per Week: Not on file  . Minutes of Exercise per Session: Not on file  Stress:   . Feeling of Stress : Not on file  Social Connections:   . Frequency of Communication with Friends and Family: Not on file  . Frequency of Social Gatherings with Friends and Family: Not on file  . Attends Religious Services: Not on file  . Active Member of Clubs or Organizations: Not on file  . Attends Archivist Meetings: Not on file  . Marital Status: Not on file   Past Surgical History:    Procedure Laterality Date  . CARDIOVASCULAR STRESS TEST  07/01/2007   EF 74%  . CATARACT EXTRACTION, BILATERAL    . CORONARY ANGIOPLASTY WITH STENT PLACEMENT  10/2004   stenting x 2 to RCA  . FEMUR IM NAIL Right 11/26/2015   Procedure: INTRAMEDULLARY RIGHT (IM) NAIL FEMORAL;  Surgeon: Rod Can, MD;  Location: [facility];  Service: Orthopedics;  Laterality: Right;  . FRACTURE SURGERY    . HERNIA REPAIR    . HIP ARTHROPLASTY  03/09/2011   Procedure: ARTHROPLASTY BIPOLAR HIP;  Surgeon: Mauri Pole;  Location: [facility];  Service: Orthopedics;  Laterality: Left;  . INSERT / REPLACE / REMOVE PACEMAKER  10/23/2016  . LAPAROSCOPIC INCISIONAL / UMBILICAL / VENTRAL HERNIA REPAIR     "below his naval"  . PACEMAKER IMPLANT N/A 10/23/2016   Procedure: Pacemaker Implant;  Surgeon: Thompson Grayer, MD;  Location: [facility];  Service: Cardiovascular;  Laterality: N/A;   Past Medical History:  Diagnosis Date  . Acute blood loss anemia   . Acute encephalopathy 06/04/2016  . Acute ischemic stroke ([place]) - L temporal lobe s/p tPA 03/30/2016  . Altered mental status   . Arthritis    "pretty much all over"   . 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 Atrial fibrillation with rapid ventricular response ([place])   . Basal cell carcinoma    "several burned off his body, face, head"  . Benign essential HTN   . BPH (benign prostatic hypertrophy)   . Cerebral hemorrhage ([facility]) w/ SDH s/p IV tPA   . Cerebral thrombosis with cerebral infarction 10/05/2017  . Cerebrovascular accident (CVA) ([place]) 09/18/2015  . Chronic anticoagulation   . CKD (chronic kidney disease), stage II 11/25/2015  . COPD (chronic obstructive pulmonary disease) ([place]) 03/09/2011  . Coronary artery disease    a. s/p PCI of RCA in 2006  . Coronary artery disease involving native coronary artery of native heart without angina pectoris   . CVA (cerebral infarction)    a. 06/2015: left thalamic and bilateral PCA  . Dementia without behavioral  disturbance ([place])   . Dysphagia as late effect of cerebrovascular disease   . Embolic stroke ([place]) [id number]  . Expressive aphasia 10/04/2017  . Gait disturbance, post-stroke 06/23/2015  . GERD (gastroesophageal reflux disease)   . Hemiparesis and other late effects of cerebrovascular accident ([place]) 06/29/2016  . History of stroke 04/04/2016  . HLD (hyperlipidemia)   . Hyperlipidemia   . Hypertension   . Ischemic stroke ([place])   . Low blood pressure reading 10/14/2017  . Orthostatic hypotension   . PAF (paroxysmal atrial fibrillation) ([place])   . Presence of permanent cardiac pacemaker   . Right hemiparesis ([place])   . Second degree Mobitz II AV block 10/23/2016  .  Seizures ([place])   . Stroke ([place])   . Thalamic infarction ([place]) 06/21/2015  . TIA (transient ischemic attack)    Approximately 6 weeks post-cardiac catheterization.   . Type 2 diabetes mellitus with complication, without long-term current use of insulin ([place])   . Vascular dementia with behavior disturbance ([place]) 06/29/2016   BP 137/82   Pulse 97   Temp 97.6 F (36.4 C)   Ht 5\' 7"  (1.702 m)   Wt 166 lb (75.3 kg)   BMI 26.00 kg/m   Opioid Risk Score:   Fall Risk Score:  `1  Depression screen PHQ 2/9  Depression screen [facility] 2/9 03/10/2019 06/26/2018 02/20/2018 01/20/2018 03/28/2017 01/29/2017 01/07/2017  Decreased Interest 1 0 0 1 1 2  0  Down, Depressed, Hopeless 1 1 0 0 0 2 0  PHQ - 2 Score 2 1 0 1 1 4  0  Altered sleeping - 3 - 0 0 0 -  Tired, decreased energy - 3 - 1 1 2  -  Change in appetite - 0 - 0 0 1 -  Feeling bad or failure about yourself  - 1 - 1 1 2  -  Trouble concentrating - 0 - 1 1 2  -  Moving slowly or fidgety/restless - 3 - 0 1 1 -  Suicidal thoughts - 0 - 0 0 1 -  PHQ-9 Score - 11 - 4 5 13  -  Difficult doing work/chores - Not difficult at all - Somewhat difficult Somewhat difficult Somewhat difficult -  Some recent data might be hidden    Review of Systems  Constitutional: Negative.   HENT: Negative.   Eyes:  Negative.   Respiratory: Negative.   Cardiovascular: Negative.   Gastrointestinal: Negative.   Endocrine: Negative.   Genitourinary: Positive for frequency.  Musculoskeletal: Positive for gait problem.       Knees weaker and not really walking  Skin: Negative.   Allergic/Immunologic: Negative.   Neurological: Positive for weakness and numbness.  Hematological: Bruises/bleeds easily.       Eliquis--very expensive costing $140/mo w insurance  Psychiatric/Behavioral: Positive for dysphoric mood.  All other systems reviewed and are negative.      Objective:   Physical Exam Examination deferred secondary to phone visit. He does have reduced auditory acuity.  He has to ask his wife to repeat things for him.  His speech is without evidence of dysarthria or aphasia       Assessment & Plan:   #1.  History of left thalamic stroke with residual right hemiparesis.  He has completed his inpatient and outpatient rehabilitation. 2.  Right knee osteoarthritis which has had some partial response to corticosteroid injection using Celestone.  Will repeat using ultrasound guidance suprapatellar bursa.  If this is not helpful we discussed genicular nerve blocks under fluoroscopic guidance. We will see him back in 6 weeks for the injection, if COVID-19 still very active at that time may need to postpone Duration of call is 6 minutes

## 2019-03-28 ENCOUNTER — Other Ambulatory Visit: Payer: Self-pay | Admitting: Adult Health

## 2019-03-30 ENCOUNTER — Ambulatory Visit: Payer: Medicare Other | Attending: Internal Medicine

## 2019-03-30 DIAGNOSIS — Z23 Encounter for immunization: Secondary | ICD-10-CM | POA: Insufficient documentation

## 2019-03-30 NOTE — Progress Notes (Signed)
   Covid-19 Vaccination Clinic  Name:  Herbert Marquez    MRN: [id number] DOB: 1927-04-14  03/30/2019  [name] was observed post Covid-19 immunization for 15 minutes without incidence. He was provided with Vaccine Information Sheet and instruction to access the V-Safe system.   Mr. Chance was instructed to call 911 with any severe reactions post vaccine: [name] Difficulty breathing  . Swelling of your face and throat  . A fast heartbeat  . A bad rash all over your body  . Dizziness and weakness    Immunizations Administered    Name Date Dose VIS Date Route   Pfizer COVID-19 Vaccine 03/30/2019  1:06 PM 0.3 mL 02/13/2019 Intramuscular   Manufacturer: [employer], [employer]   Lot: [id number]   [place]: [id number]

## 2019-04-01 ENCOUNTER — Telehealth: Payer: Self-pay | Admitting: Adult Health

## 2019-04-01 NOTE — Telephone Encounter (Signed)
Pt is needing a refill on his levETIRAcetam (KEPPRA) 750 MG tablet sent in to the [facility]

## 2019-04-02 ENCOUNTER — Telehealth (INDEPENDENT_AMBULATORY_CARE_PROVIDER_SITE_OTHER): Payer: Medicare Other | Admitting: Adult Health

## 2019-04-02 DIAGNOSIS — G459 Transient cerebral ischemic attack, unspecified: Secondary | ICD-10-CM

## 2019-04-02 DIAGNOSIS — R569 Unspecified convulsions: Secondary | ICD-10-CM | POA: Diagnosis not present

## 2019-04-02 DIAGNOSIS — E785 Hyperlipidemia, unspecified: Secondary | ICD-10-CM

## 2019-04-02 DIAGNOSIS — Z8673 Personal history of transient ischemic attack (TIA), and cerebral infarction without residual deficits: Secondary | ICD-10-CM

## 2019-04-02 DIAGNOSIS — I480 Paroxysmal atrial fibrillation: Secondary | ICD-10-CM | POA: Diagnosis not present

## 2019-04-02 DIAGNOSIS — I10 Essential (primary) hypertension: Secondary | ICD-10-CM

## 2019-04-02 MED ORDER — LEVETIRACETAM 750 MG PO TABS: 750.0000 mg | ORAL_TABLET | Freq: Two times a day (BID) | ORAL | 2 refills | Status: AC

## 2019-04-02 NOTE — Progress Notes (Signed)
I agree with the above plan 

## 2019-04-02 NOTE — Telephone Encounter (Signed)
I called wife back and made appt for pt at 1515 today. (telephone visit).

## 2019-04-02 NOTE — Telephone Encounter (Signed)
Please schedule a telephone visit in order to provide more refills.  Thank you.

## 2019-04-02 NOTE — Telephone Encounter (Signed)
I called spoke to wife.  She has no access for video, or does she want to bring him in, due to covid and his hx.  She is asking to a telephone visit.  He is  Doing well she states, last seen 10-2017. Wanted refill on levetiracetam.  No one to assist.  Please advise.  I would have scheduled out but it has been a while.

## 2019-04-02 NOTE — Progress Notes (Signed)
STROKE NEUROLOGY FOLLOW UP NOTE  NAME: Herbert Marquez DOB: [birth year]  REASON FOR VISIT: stroke and seizure follow up HISTORY FROM: pt and wife and chart  Virtual Visit via telephone  I connected with Herbert Marquez on 04/02/19 at  3:15 PM EST by telephone located at [facility] and verified that I am speaking with the correct person using two identifiers who was located at their own home accompanied by his wife.   Herbert Hum, RN scheduled visit who discussed the limitations of evaluation and management by telemedicine and the availability of in person appointments. The patient expressed understanding and agreed to proceed.    History Summary HerbertHerbert C Jonesis a 87 y.o.malewith history of DM, HTN, HLD, previous stroke in 06/2015 at b/l thalami and right mesial temporal lobe due to left PCA occlusion and right PCA high grade stenosis, afib on 11/28/2015 post right hip fracture surgery on eliquis but later discontinued at follow up clinic and put on DAPT, and CAD s/p PCI admitted on 03/30/16 for speechdifficulties and aphasia. Hereceived IV t-PA. MRI showed left temproal infarct with hemorrhagic conversion and SDH. CTA head and neck 60-70% proximal right ICA and 40-50% left ICA stenosis. EF 65-70% in 11/2015. LDL 38 and A1C 5.0. ASA started 7 days post stroke due to hemorrhagic conversion and eliquis started on 04/13/16. lipitor down from 80mg  to 40mg . He was discharged to [facility].   06/04/16 readmission - pt admitted for acute onset confusion and agitation. MRI punctate right hippocampal infarct likely acute, and old left temporal lobe hemorrhagic infarct. His spell on admission most suggestive of seizure was started on Keppra  at 500 mg twice daily. EEG moderatediffuse slowing. His eliquis and lipitor continued and discharged to [facility].  07/25/16 follow up ([facility]) - Patient returns to the stroke clinic today for follow-up he has not had further stroke TIA symptoms,  he remains  on eliquis for secondary stroke prevention and atrial fibrillation. He has not had any further seizure activity and remains on Keppra 500 twice daily. He is on Lipitor for hyperlipidemia without complaints of muscle aches. His Seroquel controls his agitation according to the wife and he sleeps well at night. Appetite is reportedly good. His therapies have concluded. [facility] He has no new neurologic findings.  10/09/16 follow up - the patient has been doing well. He finished home PT/OT and now on outpt PT/OT. Still use wheelchair most time but able to go to bathroom on walker. Bp stable at home 110/70s. No bleeding while on eliquis. Still has mild right hemiparesis. BP today 107/68. Continued on keppra. seroquel works well with him.   01/07/17 admission -admitted for agitation and not able to speak.  Wife denies any staring spells or seizure-like activity.  He needed 1 mg Ativan and 10 mg Haldol for sedation but still quite agitated at that time in the ER.  CT head showed no bleeding.  CTA head and neck showed occlusion of right VA V3 segment, 55% stenosis of proximal right ICA.  MRI brain showed no acute infarct.  EF 55%.  EEG no seizure activity but moderate diffuse slowing.  LDL 62 and A1c 5.7.  Concerning the episode more likely seizure episode, less likely stroke.  Continued on aspirin 81 and an Eliquis as well as Lipitor.  Also increase Keppra to 750 twice daily.  04/23/2017 visit JX/JV: During the interval time, patient has been doing well.  No recurrent  stroke or seizure-like activity.  Continued on aspirin 81, Eliquis, Lipitor, and Keppra 750 twice daily without side effects.  BP today 129/79.  Patient continue follow-up with Dr. Read Drivers, and currently on outpatient PT/OT.  Came in with wheelchair, but able to walk with walker at home.  Seroquel at night for agitation and sundowning, wife stated that it helps a lot.  update 10/21/2017: Patient is being seen today for  follow-up appointment and is accompanied by his wife.  Since previous follow-up visit, patient was seen in the ED on 10/04/2017 with expressive aphasia and this was determined to be likely related to TIA.  All imaging negative and symptoms completely resolved in less than 24 hours.  Patient was discharged to SNF and was discharged home on 10/12/2017 with home health.  He states since his recent admission, patient has not had any TIA/stroke symptoms or seizure activity.  He has completed all therapies but continues to do home exercises daily for approximately 30 minutes a day.  He currently is sitting in wheelchair but is able to ambulate short distances with rolling walker.  Denies any recent falls.  Continues to take Eliquis for atrial fibrillation and and aspirin 81 mg secondary stroke prevention without side effects of bleeding or bruising.  Continues to take Lipitor without side effects myalgias.  Patient continues to take Keppra without side effects or recent seizure activity.  Blood pressure today satisfactory 133/86.  He continues to live at home with his wife where they are able to maintain all ADLs and IADLs but does have a home nurse come 1 time per week for vital check and basic assessment.  He also continues to see Dr. Beatriz Stallion for right knee injection.  Denies new or worsening stroke/TIA symptoms since recent hospital admission.  Update via telephone: Herbert Marquez scheduled a telephone visit today for seizure medication follow up as they are needing additional refills.  He has been doing well on Keppra 750 mg twice daily tolerating well without additional seizure activity.  He has continued on aspirin and Eliquis without bleeding or bruising.  Continues on atorvastatin without myalgias.  His wife does endorse that he has been experiencing increase shortness of breath with exertion but does admit to sedentary lifestyle without much activity.  He is mainly nonambulatory at this time and transfers mainly via  wheelchair.  She has not discussed the symptoms with PCP or cardiology.  No further concerns at this time.        Assessment: Herbert Marquez is a 86 year old male with left temporal infarct with hemorrhagic conversion and SDH on 03/30/2016 and right hippocampal infarct on 06/04/2016 which was felt to be an incidental finding due to symptoms and it was determined that the symptoms were likely more related to seizure activity and was started on Keppra at that time along with admission on 01/07/2017 again for likely seizure activity.  Vascular risk factors include DM, HTN, HLD, PAF on AC, previous infarcts, right PCA high-grade stenosis and seizures.  Patient is being seen today for follow-up appointment after recent hospitalization on 10/04/2017 with diagnosis of TIA.  Underwent telephone visit today for medication management and refill of Keppra due to COVID-19 safety concerns with coming in for office appointment and unable to participate in virtual visit.  He continues to be stable at home and has not had any reoccurring seizure activity     Plan:  - continue ASA 81mg  and eliquis and lipitor for stroke prevention -Continue Keppra 750 twice daily  for seizure phylaxis -refill provided -f/u with cardiologist as scheduled for PAF and [facility] management -Advised to follow-up with PCP and/or cardiologist in regards to shortness of breath with exertion concerns for further evaluation - Follow up with your primary care physician for stroke risk factor modification. Recommend maintain blood pressure goal <130/80, diabetes with hemoglobin A1c goal below 7.0% and lipids with LDL cholesterol goal below 70 mg/dL.   - follow up with in office visit in 6 months or call earlier if needed  I spent more than 15 minutes of non-face to face time with the patient. Greater than 50% of time was spent in counseling and coordination of care in regards to history of stroke and seizures with ongoing use of AED and answered all  questions to patient and wife satisfaction  Venancio Poisson, AGNP-BC  [facility] [address] [place], [place] [address]  Phone [phone] Fax [phone] Note: This document was prepared with digital dictation and possible smart phrase technology. Any transcriptional errors that result from this process are unintentional.

## 2019-04-11 ENCOUNTER — Other Ambulatory Visit: Payer: Self-pay | Admitting: Adult Health

## 2019-04-20 ENCOUNTER — Ambulatory Visit: Payer: Medicare Other | Attending: Internal Medicine

## 2019-04-20 DIAGNOSIS — Z23 Encounter for immunization: Secondary | ICD-10-CM

## 2019-04-20 NOTE — Progress Notes (Signed)
   Covid-19 Vaccination Clinic  Name:  Herbert Marquez    MRN: [id number] DOB: 03/08/1927  04/20/2019  Herbert Marquez was observed post Covid-19 immunization for 15 minutes without incidence. He was provided with Vaccine Information Sheet and instruction to access the V-Safe system.   Herbert Marquez was instructed to call 911 with any severe reactions post vaccine: [name] Difficulty breathing  . Swelling of your face and throat  . A fast heartbeat  . A bad rash all over your body  . Dizziness and weakness    Immunizations Administered    Name Date Dose VIS Date Route   Pfizer COVID-19 Vaccine 04/20/2019 11:48 AM 0.3 mL 02/13/2019 Intramuscular   Manufacturer: [place]   Lot: [id number]   [place]: [id number]

## 2019-04-22 ENCOUNTER — Other Ambulatory Visit: Payer: Self-pay | Admitting: Cardiovascular Disease

## 2019-04-22 NOTE — Telephone Encounter (Signed)
Prescription refill request for Eliquis received.  Last office visit: 11/26/2018, Telemedicine Scr: 1.10, 10/05/2017 Age: 86 y.o. Weight: 75.3 kg   Prescription refill sent, 1 Month supply. Pt is overdue for labs. Has an appointment scheduled on 05/06/2019 to get labs.

## 2019-05-06 ENCOUNTER — Ambulatory Visit (INDEPENDENT_AMBULATORY_CARE_PROVIDER_SITE_OTHER): Payer: Medicare Other | Admitting: Cardiovascular Disease

## 2019-05-06 ENCOUNTER — Other Ambulatory Visit: Payer: Self-pay

## 2019-05-06 ENCOUNTER — Encounter: Payer: Self-pay | Admitting: Cardiovascular Disease

## 2019-05-06 VITALS — BP 132/74 | HR 94 | Ht 67.0 in | Wt 162.0 lb

## 2019-05-06 DIAGNOSIS — I2510 Atherosclerotic heart disease of native coronary artery without angina pectoris: Secondary | ICD-10-CM | POA: Diagnosis not present

## 2019-05-06 NOTE — Progress Notes (Signed)
Cardiology Office Note   Date:  05/06/2019   ID:  Herbert Marquez, DOB 1927-09-30, MRN [id number]  PCP:  [name], NP  Cardiologist:   [name], MD   No chief complaint on file.  Problem list:  1. Coronary artery disease-status post PTCA and stenting of the RCA in [place], [place]  2. Dyslipidemia  3. Hypertension  4. Diabetes mellitus  5. Hernia repair   Prior Notes:   Pt has done well from a cardiac standpoint. He fell and broke his left hip in January . His is recovering well. He is still walking with a cane. He had no complications during or after surgery.  He had a severe UTI in June / July of 2013. He has been seen by Urology and was started on Tamulosin for BPH.  He's not had any episodes of chest pain or shortness breath. He still walks with a cane but is able to get out and stays fairly active.  October 02, 2012:  Herbert Marquez is doing well. No CP , no dyspnea. He remains active - went to the beach last week. He walks at [facility] when he is down there. Goes to the [facility] 3 times a week. No angina.  Jan. 30, 2015:  Herbert Marquez is doing well. He has not had any cardiac problems. He has been to the beach recently.  He goes to the [facility] 2-3 times a week. He doesn't have any angina. His primary medical doctor is managing his lipids.    Feb. 18, 2016:   Herbert Marquez is a 86 y.o. male who presents for follow up of his CAD. NO CP or dyspnea.    Limited by leg and back pains.  He and his wife celebrated their 59th wedding anniversary.   They went to [facility] last week.    October 25, 2014:  Doing well from a cardiac standpoint..  Has a circk in his neck Staying buys,   Still has his place at [facility].   May 10, 2015: Doing great. Some DOE  Still going down to [facility]  No CP, Wants refill on NTG - lost his bottle  May 17, 2016:   Has had a rough year since I last saw him  Has had 2 strokes since I last saw him  Has severe weakness of his  right side now ( 1st stroke)  The 2nd stroke affected his memory - has improved.  Was found to have PAF during his hospitalization in Feb.  Was on plavix,  Has been changed to [facility]   August 20, 2016:  Herbert Marquez is seen today  Had another small stroke since Iast saw him in March  No cardiac issues  March 12, 2017:  Herbert Marquez is seen today for follow-up visit. He is examined in the wheelchair. Had a pacer placed in Aug , 2018. Had a seizure in Nov. 2018.   Increased his Keppra  Feels better.   November 12, 2017:  Herbert Marquez is seen today. Examined in the wheelchair Had a TIA last month.  Was in the hospital for 4-5 days  BP has been good.  Is a bit elevated here in the office  In the hospital and at the [facility] home, his Bp was normal .   Now that he is eating at home ( a bit more salty) BP has incresed.  Advised him to decrease his salt intake   May 06, 2019: [name] is seen today for follow-up visit. No further TIAs. Is in the wheelchair all the time now.   Is not walking   Is getting more fatigued .  Wife asked about getting PT I think it would be a good idea - would defer to primary md   Past Medical History:  Diagnosis Date  . Acute blood loss anemia   . Acute encephalopathy 06/04/2016  . Acute ischemic stroke ([place]) - L temporal lobe s/p tPA 03/30/2016  . Altered mental status   . Arthritis    "pretty much all over"   . 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 Atrial fibrillation with rapid ventricular response ([place])   . Basal cell carcinoma    "several burned off his body, face, head"  . Benign essential HTN   . BPH (benign prostatic hypertrophy)   . Cerebral hemorrhage (HCC) w/ SDH s/p IV tPA   . Cerebral thrombosis with cerebral infarction 10/05/2017  . Cerebrovascular accident (CVA) ([place]) 09/18/2015  . Chronic anticoagulation   . CKD (chronic kidney disease), stage II 11/25/2015  . COPD (chronic obstructive pulmonary disease) ([place]) 03/09/2011  .  Coronary artery disease    a. s/p PCI of RCA in 2006  . Coronary artery disease involving native coronary artery of native heart without angina pectoris   . CVA (cerebral infarction)    a. 06/2015: left thalamic and bilateral PCA  . Dementia without behavioral disturbance ([place])   . Dysphagia as late effect of cerebrovascular disease   . Embolic stroke ([place]) [id number]  . Expressive aphasia 10/04/2017  . Gait disturbance, post-stroke 06/23/2015  . GERD (gastroesophageal reflux disease)   . Hemiparesis and other late effects of cerebrovascular accident ([place]) 06/29/2016  . History of stroke 04/04/2016  . HLD (hyperlipidemia)   . Hyperlipidemia   . Hypertension   . Ischemic stroke ([place])   . Low blood pressure reading 10/14/2017  . Orthostatic hypotension   . PAF (paroxysmal atrial fibrillation) ([place])   . Presence of permanent cardiac pacemaker   . Right hemiparesis ([place])   . Second degree Mobitz II AV block 10/23/2016  . Seizures ([place])   . Stroke ([place])   . Thalamic infarction ([place]) 06/21/2015  . TIA (transient ischemic attack)    Approximately 6 weeks post-cardiac catheterization.   . Type 2 diabetes mellitus with complication, without long-term current use of insulin ([place])   . Vascular dementia with behavior disturbance ([place]) 06/29/2016    Past Surgical History:  Procedure Laterality Date  . CARDIOVASCULAR STRESS TEST  07/01/2007   EF 74%  . CATARACT EXTRACTION, BILATERAL    . CORONARY ANGIOPLASTY WITH STENT PLACEMENT  10/2004   stenting x 2 to RCA  . FEMUR IM NAIL Right 11/26/2015   Procedure: INTRAMEDULLARY RIGHT (IM) NAIL FEMORAL;  Surgeon: Rod Can, MD;  Location: [facility];  Service: Orthopedics;  Laterality: Right;  . FRACTURE SURGERY    . HERNIA REPAIR    . HIP ARTHROPLASTY  03/09/2011   Procedure: ARTHROPLASTY BIPOLAR HIP;  Surgeon: Mauri Pole;  Location: [facility];  Service: Orthopedics;  Laterality: Left;  . INSERT / REPLACE / REMOVE PACEMAKER  10/23/2016  . LAPAROSCOPIC  INCISIONAL / UMBILICAL / VENTRAL HERNIA REPAIR     "below his naval"  . PACEMAKER IMPLANT N/A 10/23/2016   Procedure: Pacemaker Implant;  Surgeon: Thompson Grayer, MD;  Location: [facility];  Service: Cardiovascular;  Laterality: N/A;     Current Outpatient Medications  Medication Sig Dispense  Refill  . acetaminophen (TYLENOL) 325 MG tablet Take 2 tablets (650 mg total) every 4 (four) hours as needed by mouth for mild pain (or temp > 37.5 C (99.5 F)). 30 tablet 0  . aspirin EC 81 MG tablet Take 1 tablet (81 mg total) by mouth daily.    [name] atorvastatin (LIPITOR) 80 MG tablet Take 80 mg by mouth daily.    . calcium carbonate (TUMS - DOSED IN MG ELEMENTAL CALCIUM) 500 MG chewable tablet Chew 1 tablet by mouth daily as needed for indigestion or heartburn.    [name] ELIQUIS 5 MG TABS tablet TAKE 1 TABLET BY MOUTH 2 TIMES DAILY. 60 tablet 0  . levETIRAcetam (KEPPRA) 750 MG tablet Take 1 tablet (750 mg total) by mouth 2 (two) times daily. 180 tablet 2  . Multiple Vitamin (MULTIVITAMIN WITH MINERALS) TABS tablet Take 1 tablet by mouth daily.    . nitroGLYCERIN (NITROSTAT) 0.4 MG SL tablet Place 1 tablet (0.4 mg total) under the tongue every 5 (five) minutes as needed for chest pain. 25 tablet 5  . phenylephrine-shark liver oil-mineral oil-petrolatum (PREPARATION H) 0.25-3-14-71.9 % rectal ointment Place 1 application rectally as needed for hemorrhoids.    . polyvinyl alcohol (ARTIFICIAL TEARS) 1.4 % ophthalmic solution Place 1 drop into both eyes daily as needed for dry eyes.    . Pumpkin Seed-Soy Germ (AZO BLADDER CONTROL/GO-LESS PO) Take 2 capsules by mouth 2 (two) times daily.    . QUEtiapine (SEROQUEL) 25 MG tablet TAKE 1 TABLET BY MOUTH AT BEDTIME. 90 tablet 0   No current facility-administered medications for this visit.    Allergies:   Patient has no known allergies.    Social History:  The patient  reports that he quit smoking about 48 years ago. He has never used smokeless tobacco. He  reports current alcohol use of about 8.0 standard drinks of alcohol per week. He reports that he does not use drugs.   Family History:  The patient's family history includes Hypertension in his mother; Lung cancer in his brother and father.    ROS: Noted in current history.  All other systems are negative.   Physical Exam: Blood pressure 132/74, pulse 94, height 5\' 7"  (1.702 m), weight 162 lb (73.5 kg), SpO2 (!) 85 %.  GEN:  Well nourished, well developed in no acute distress HEENT: Normal NECK: No JVD; No carotid bruits LYMPHATICS: No lymphadenopathy CARDIAC: RRR , no murmurs, rubs, gallops RESPIRATORY:  Clear to auscultation without rales, wheezing or rhonchi  ABDOMEN: Soft, non-tender, non-distended MUSCULOSKELETAL:  No edema; No deformity  SKIN: Warm and dry NEUROLOGIC:  Alert and oriented x 3   EKG: May 06, 2019: Normal sinus rhythm at 94.  Right bundle branch block.  No changes from previous EKG.   Recent Labs: No results found for requested labs within last 8760 hours.    Lipid Panel    Component Value Date/Time   CHOL 94 10/05/2017 0516   CHOL 110 04/02/2017 0859   TRIG 53 10/05/2017 0516   HDL 31 (L) 10/05/2017 0516   HDL 37 (L) 04/02/2017 0859   CHOLHDL 3.0 10/05/2017 0516   VLDL 11 10/05/2017 0516   LDLCALC 52 10/05/2017 0516   LDLCALC 57 04/02/2017 0859      Wt Readings from Last 3 Encounters:  05/06/19 162 lb (73.5 kg)  03/10/19 166 lb (75.3 kg)  01/27/19 170 lb (77.1 kg)      Other studies Reviewed: Additional studies/ records that were reviewed today  include: . Review of the above records demonstrates:    ASSESSMENT AND PLAN:  1. Coronary artery disease-status post PTCA and stenting of the RCA in [place], [place]  -   Not having any episodes of angina.  2.  Failure to thrive: Herbert Marquez general health continues to gradually decline.  He is now wheelchair-bound.  He gets short of breath even wheeling himself from the den to  the bathroom.  He asked about physical therapy.  I would defer to his primary medical doctor.  I told him that this was probably fairly normal for someone his age with his medical problems.    2. Dyslipidemia -   . Continue  Crestor  3. Hypertension  -    continue current medications.   Current medicines are reviewed at length with the patient today.  The patient does not have concerns regarding medicines.  The following changes have been made:  no change   Disposition:   FU with me in 1 year     Signed, [name], MD  05/06/2019 3:58 PM    [facility] [place], [place], [place]  [address] Phone: [phone]; Fax: [phone]

## 2019-05-06 NOTE — Patient Instructions (Signed)
Medication Instructions:   Your physician recommends that you continue on your current medications as directed. Please refer to the Current Medication list given to you today.   *If you need a refill on your cardiac medications before your next appointment, please call your pharmacy*    Follow-Up: At [facility], you and your health needs are our priority.  As part of our continuing mission to provide you with exceptional heart care, we have created designated Provider Care Teams.  These Care Teams include your primary Cardiologist (physician) and Advanced Practice Providers (APPs -  Physician Assistants and Nurse Practitioners) who all work together to provide you with the care you need, when you need it.  We recommend signing up for the patient portal called "MyChart".  Sign up information is provided on this After Visit Summary.  MyChart is used to connect with patients for Virtual Visits (Telemedicine).  Patients are able to view lab/test results, encounter notes, upcoming appointments, etc.  Non-urgent messages can be sent to your provider as well.   To learn more about what you can do with MyChart, go to [email or web address].    Your next appointment:   12 month(s)  The format for your next appointment:   In Person  Provider:   [name], MD

## 2019-05-25 ENCOUNTER — Ambulatory Visit (INDEPENDENT_AMBULATORY_CARE_PROVIDER_SITE_OTHER): Payer: Medicare Other | Admitting: Family Medicine

## 2019-05-25 ENCOUNTER — Encounter: Payer: Self-pay | Admitting: Family Medicine

## 2019-05-25 ENCOUNTER — Other Ambulatory Visit: Payer: Self-pay | Admitting: Cardiovascular Disease

## 2019-05-25 ENCOUNTER — Other Ambulatory Visit: Payer: Self-pay

## 2019-05-25 VITALS — BP 144/88 | HR 87 | Temp 97.5°F | Resp 22 | Ht 67.0 in

## 2019-05-25 DIAGNOSIS — R0602 Shortness of breath: Secondary | ICD-10-CM

## 2019-05-25 DIAGNOSIS — J449 Chronic obstructive pulmonary disease, unspecified: Secondary | ICD-10-CM | POA: Diagnosis not present

## 2019-05-25 DIAGNOSIS — I639 Cerebral infarction, unspecified: Secondary | ICD-10-CM

## 2019-05-25 DIAGNOSIS — R4182 Altered mental status, unspecified: Secondary | ICD-10-CM

## 2019-05-25 DIAGNOSIS — F0151 Vascular dementia with behavioral disturbance: Secondary | ICD-10-CM

## 2019-05-25 DIAGNOSIS — E118 Type 2 diabetes mellitus with unspecified complications: Secondary | ICD-10-CM

## 2019-05-25 DIAGNOSIS — N182 Chronic kidney disease, stage 2 (mild): Secondary | ICD-10-CM

## 2019-05-25 DIAGNOSIS — R05 Cough: Secondary | ICD-10-CM | POA: Diagnosis not present

## 2019-05-25 DIAGNOSIS — D509 Iron deficiency anemia, unspecified: Secondary | ICD-10-CM

## 2019-05-25 DIAGNOSIS — I10 Essential (primary) hypertension: Secondary | ICD-10-CM

## 2019-05-25 DIAGNOSIS — I69359 Hemiplegia and hemiparesis following cerebral infarction affecting unspecified side: Secondary | ICD-10-CM

## 2019-05-25 DIAGNOSIS — F01518 Vascular dementia, unspecified severity, with other behavioral disturbance: Secondary | ICD-10-CM

## 2019-05-25 DIAGNOSIS — I69398 Other sequelae of cerebral infarction: Secondary | ICD-10-CM

## 2019-05-25 DIAGNOSIS — R569 Unspecified convulsions: Secondary | ICD-10-CM | POA: Diagnosis not present

## 2019-05-25 DIAGNOSIS — R059 Cough, unspecified: Secondary | ICD-10-CM

## 2019-05-25 DIAGNOSIS — I480 Paroxysmal atrial fibrillation: Secondary | ICD-10-CM

## 2019-05-25 DIAGNOSIS — I6381 Other cerebral infarction due to occlusion or stenosis of small artery: Secondary | ICD-10-CM

## 2019-05-25 NOTE — Progress Notes (Signed)
Pt here for an acute care OV today  Impression and Recommendations:     Cough - Plan: DG Chest 2 View, Direct LDL  SOB (shortness of breath) - Plan: DG Chest 2 View, Direct LDL  Thalamic infarction ([place]) - Plan: Direct LDL  Vascular dementia with behavior disturbance (HCC) - Plan: Direct LDL  Seizures ([place]) - Plan: Direct LDL  PAF (paroxysmal atrial fibrillation) ([place]) - Plan: Direct LDL  Hemiparesis and other late effects of cerebrovascular accident ([place]) - Plan: Direct LDL  Chronic obstructive pulmonary disease, unspecified COPD type ([place]) - Plan: Direct LDL  Altered mental status, unspecified altered mental status type - Plan: TSH, Direct LDL  Type 2 diabetes mellitus with complication, without long-term current use of insulin (HCC) - Plan: Hemoglobin A1c, Comprehensive metabolic panel, Direct LDL  Benign essential HTN - Plan: Comprehensive metabolic panel, Direct LDL  CKD (chronic kidney disease), stage II - Plan: Comprehensive metabolic panel, Direct LDL  Iron deficiency anemia, unspecified iron deficiency anemia type - Plan: CBC with Differential/Platelet, Direct LDL     Of note, this is my first time meeting patient.  Patient is new to me and was previously being cared for at our office by Herbert Marble, NP, who no longer works at primary care [employer].   - Last OV with Herbert Marquez was 4 of 2020 only for Medicare Wellness. - Last chronic care OV on 11 of 2019!    Cough, SOB - History of known COPD dx back in 2013 - Reviewed patient's symptoms extensively during appointment. - Explained sounded like Sx of Emphysema  - Education and counseling provided and all questions answered.  - Patient was recently seen by Cardiologist Dr. Acie Marquez on 05/06/2019 and discussed concerns of SOB at that time.  Pt Wife and pt, no concerns of cardiac etiology were expressed and EKG WNL's  - Recommended X-ray today despite essentially normal exam.   order placed. - Patient and  wife know to call Herbert Marquez of front desk if they have not heard about their X-ray referral by the end of the day tomorrow.  - Need for full fasting blood work near future.  Per wife, he is very difficult to bring to appointments and she requests we draw his blood work today. -Explained we can order a direct LDL in addition to a lipid panel to account for the fact that he is not fasting  - see orders.  - Upon review of patient's medical records, discovered history of COPD from 1 of 2013, and will consider pulmonary function tests through pulmonologist in the future if chest X-ray is normal.  Discussed this historical diagnosis with patient and wife in office today, and advised that patient's current symptoms do resemble emphysema. -Explained that without having any other symptoms, this is unlikely to be acute lung process such as pneumonia which they were concerned about  - Encouraged patient to continue breathing exercises at home, and continue working on increasing his activity levels as tolerated.    - Every commercial on TV, advised patient to use his breathing machine to take 3 huge breaths using his spirometer.  - Advised patient to drink adequate amounts of water.  - Avoid going outside and sitting in the pollen during this time a year which may cause more congestion in him  - Discussed that if symptoms continue, CT of chest may be indicated in future/and/or referral to pulmonology for further eval  - If patient desires physical therapy in  future, as discussed in prior appointments with Herbert Marquez,, discussed option of referral to [place].  - If patient develops fever or chills, he knows to call immediately for additional care and assessment.  Patient knows to let the clinic know if anything changes.  - Continue to follow up with specialists as established  - Will continue to monitor.    Dermatological Concerns - Encouraged patient to follow up with specialist for skin  concerns - Advised patient to call his dermatologist Dr. Ronnald Marquez if any area of rash or concern changes or begins spreading.   Recommendations - Return for chronic follow-up 6 months.  -We will need to make sure a T3 and free T4 were drawn today along with his TSH.     Orders Placed This Encounter  Procedures  . DG Chest 2 View  . Direct LDL     Education and routine counseling performed. Handouts provided   Please see AVS handed out to patient at the end of our visit for further patient instructions/ counseling done pertaining to today's office visit.   Return for f/up VERY near future for FBW and obtain UA w possible cx; o/w 4-85mo chronic care.     Note:  This document was prepared occasionally using Dragon voice recognition software and may include unintentional dictation errors in addition to a scribe.   The [place] was signed into law in 2016 which includes the topic of electronic health records.  This provides immediate access to information in MyChart.  This includes consultation notes, operative notes, office notes, lab results and pathology reports.  If you have any questions about what you read please let us know at your next visit or call us at the office.  We are right here with you.  This case required medical decision making of at least moderate complexity.  This document serves as a record of services personally performed by [name], DO. It was created on her behalf by Herbert Marquez, a trained medical scribe. The creation of this record is based on the scribe's personal observations and the provider's statements to them.    The above documentation from Herbert Marquez, medical scribe, has been reviewed by Herbert Marquez,  D.O.      --------------------------------------------------------------------------------------------------------------------------------------------------------------------------------------------------------------------------------------------    Subjective:     Herbert Marquez, am serving as scribe for Dr.Trinette Marquez.   CC per Wife:  -Cough -SOB approx 3wks    HPI: Herbert Marquez is a 86 y.o. male who presents to [place] at [facility] today for issues as discussed below.   Patient presents to the office today accompanied by his wife.   Notes they have not been around anyone sick recently; wife states "we haven't been anywhere."  Their kids come and bring groceries and wear masks during these visits, and wife notes "after they leave, we spray everything down."  Patient himself states that he is here today "because I get so nervous at times, and the least little thing I do, I get out of breath, and it takes me a little while to get [my breath] back."  Wife says he gets SOB "with just moving."  Patient denies feeling weak and tired.  Says when he takes his shower, "my breath goes up."  Notes "it takes me several minutes to get my breath going again."  In terms of bronchitis this time of year, or environmental/seasonal allergies, denies concerns.  Wife notes that his current symptoms have been  going on for "a few weeks, a couple of weeks."  Says "he's always had a little bit of this."  Wife notes that in the past, the patient has had three strokes, a seizure, and a broken hip.  Wife states "you name it, he's had it."  Per wife, he's had a heart attack in the past, "years ago."  Patient notes he was 77 at the time, and 36 now.  They just saw the Cardiologist, Dr. Acie Marquez, for follow-up last week or the week before.  During that appointment, he performed an EKG, and per patient, "they did not think it was my heart ". ..."everybody thought [the SOB] was a  short-lived thing, but it's not going away like they thought it would."   Wife says that the patient has been recently coughing, and has a lot of phlegm.  No blood.  Wife says "the last couple of days, he's not coughing nearly as much," but notes "this morning, he had the phlegm, got a little sick, and threw up."  The patient has had the phlegm/coughing/congestion for longer than a week or two.    States that one morning it was so much that caused him to vomit.   Wife believes that the coughing/phlegm came on around the time that he became more short of breath but not sure.  Wife notes that the phlegm comes on mainly morning and occasionally at night.  Patient did not cough during appointment today.  Wife says "he does not move around much at all."  Says she is worried about pneumonia due to his lack of activity.  The patient remains able to transfer himself from chair to the bathroom etc., without assistance of wife.  It's been four years since his last stroke, but wife notes he's getting weaker.  He used to use the walker in the house, and can't do this anymore.  For exercise, patient states he "does different things in the chair" every day.  He pushes himself to stand up five times per day, and remains able to walk.  Besides the SOB, cough, and increased phlegm/congestion over the past couple of weeks, patient denies fever, chills.  Does not believe he's felt sick or felt he was coming down with pneumonia, etc.  Denies body aches.  He was a smoker in the past, and quit in 1973.  He was never told he had COPD in the past.  Patient states he uses a machine for his breathing every night; "the one with the little plastic ball."  Says his appetite isn't what it used to be, but overall he is eating and drinking okay, and sleeping okay.  Wife states "if he lays down, he goes right to sleep."  Denies increased weakness or fatigue.  Wife notes "I do worry that he's lost weight."  Denies new onset of  GI symptoms.  Confirms his stools have been the same as usual.  Wife notes he's had increased urination for a long time, and has tried Myrbetriq and other medications to treat this.  No new onset of urinary symptoms.  - Dermatology Says that the rash itches "every once in a while."  He follows up with dermatologist Wilhemina Bonito.   Wt Readings from Last 3 Encounters:  05/06/19 162 lb (73.5 kg)  03/10/19 166 lb (75.3 kg)  01/27/19 170 lb (77.1 kg)   BP Readings from Last 3 Encounters:  05/25/19 (!) 144/88  05/06/19 132/74  03/10/19 137/82   BMI Readings from Last 3  Encounters:  05/25/19 25.37 kg/m  05/06/19 25.37 kg/m  03/10/19 26.00 kg/m     Patient Care Team    Relationship Specialty Notifications Start End  Herbert Marquez D, NP PCP - General Family Medicine  03/20/16   Nahser, Wonda Cheng, MD PCP - Cardiology Cardiology Admissions 11/12/17   Rod Can, MD Consulting Physician Orthopedic Surgery  03/20/16   Jarome Matin, MD Consulting Physician Dermatology  03/20/16   Nickie Retort, MD Consulting Physician Urology  03/20/16   Shon Hough, MD Consulting Physician Ophthalmology  03/20/16      Patient Active Problem List   Diagnosis Date Noted  . Encounter for Medicare annual wellness exam 06/26/2018  . Low blood pressure reading 10/14/2017  . Cerebral thrombosis with cerebral infarction 10/05/2017  . Expressive aphasia 10/04/2017  . TIA (transient ischemic attack)   . Presence of permanent cardiac pacemaker   . Hypertension   . Hyperlipidemia   . Coronary artery disease   . Basal cell carcinoma   . Arthritis   . Pressure injury of skin 01/08/2017  . Stroke ([place]) 01/07/2017  . Ischemic stroke ([place])   . Second degree Mobitz II AV block 10/23/2016  . Seizures ([place]) 10/09/2016  . Healthcare maintenance 10/01/2016  . Restlessness and agitation 07/02/2016  . Vascular dementia with behavior disturbance ([place]) 06/29/2016  . Hemiparesis and other late effects of  cerebrovascular accident ([place]) 06/29/2016  . Embolic stroke ([place]) 0000000  . Right hemiparesis ([place])   . History of CVA with residual deficit   . Chronic anticoagulation   . Atrial fibrillation with rapid ventricular response ([place])   . Seizure prophylaxis   . Diastolic dysfunction   . Bacteremia due to Gram-positive bacteria   . Altered mental status   . Dementia without behavioral disturbance ([place])   . Leukocytosis   . Acute blood loss anemia   . Acute encephalopathy 06/04/2016  . PAF (paroxysmal atrial fibrillation) ([place])   . Iron deficiency anemia 04/04/2016  . History of stroke 04/04/2016  . Cerebral hemorrhage (HCC) w/ SDH s/p IV tPA   . Coronary artery disease involving native coronary artery of native heart without angina pectoris   . Orthostatic hypotension   . History of right hip replacement   . Acute ischemic stroke ([place]) - L temporal lobe s/p tPA 03/30/2016  . BPH (benign prostatic hyperplasia) 11/25/2015  . GERD (gastroesophageal reflux disease) 11/25/2015  . CKD (chronic kidney disease), stage II 11/25/2015  . Overactive bladder   . Cerebrovascular accident (CVA) ([place]) 09/18/2015  . Gait disturbance, post-stroke 06/23/2015  . 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 Thalamic infarction ([place]) 06/21/2015  . Benign essential HTN   . Type 2 diabetes mellitus with complication, without long-term current use of insulin ([place])   . Dysphagia as late effect of cerebrovascular disease   . Hyponatremia   . HLD (hyperlipidemia)   . COPD (chronic obstructive pulmonary disease) ([place]) 03/09/2011      Past Medical History:  Diagnosis Date  . Acute blood loss anemia   . Acute encephalopathy 06/04/2016  . Acute ischemic stroke ([place]) - L temporal lobe s/p tPA 03/30/2016  . Altered mental status   . Arthritis    "pretty much all over"   . 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 Atrial fibrillation with rapid ventricular response ([place])   . Basal cell carcinoma    "several  burned off his body, face, head"  . Benign essential HTN   . BPH (benign  prostatic hypertrophy)   . Cerebral hemorrhage (HCC) w/ SDH s/p IV tPA   . Cerebral thrombosis with cerebral infarction 10/05/2017  . Cerebrovascular accident (CVA) ([place]) 09/18/2015  . Chronic anticoagulation   . CKD (chronic kidney disease), stage II 11/25/2015  . COPD (chronic obstructive pulmonary disease) ([place]) 03/09/2011  . Coronary artery disease    a. s/p PCI of RCA in 2006  . Coronary artery disease involving native coronary artery of native heart without angina pectoris   . CVA (cerebral infarction)    a. 06/2015: left thalamic and bilateral PCA  . Dementia without behavioral disturbance ([place])   . Dysphagia as late effect of cerebrovascular disease   . Embolic stroke ([place]) [id number]  . Expressive aphasia 10/04/2017  . Gait disturbance, post-stroke 06/23/2015  . GERD (gastroesophageal reflux disease)   . Hemiparesis and other late effects of cerebrovascular accident ([place]) 06/29/2016  . History of stroke 04/04/2016  . HLD (hyperlipidemia)   . Hyperlipidemia   . Hypertension   . Ischemic stroke ([place])   . Low blood pressure reading 10/14/2017  . Orthostatic hypotension   . PAF (paroxysmal atrial fibrillation) ([place])   . Presence of permanent cardiac pacemaker   . Right hemiparesis ([place])   . Second degree Mobitz II AV block 10/23/2016  . Seizures ([place])   . Stroke ([place])   . Thalamic infarction ([place]) 06/21/2015  . TIA (transient ischemic attack)    Approximately 6 weeks post-cardiac catheterization.   . Type 2 diabetes mellitus with complication, without long-term current use of insulin ([place])   . Vascular dementia with behavior disturbance ([place]) 06/29/2016     Past Surgical History:  Procedure Laterality Date  . CARDIOVASCULAR STRESS TEST  07/01/2007   EF 74%  . CATARACT EXTRACTION, BILATERAL    . CORONARY ANGIOPLASTY WITH STENT PLACEMENT  10/2004   stenting x 2 to RCA  . FEMUR IM NAIL Right 11/26/2015    Procedure: INTRAMEDULLARY RIGHT (IM) NAIL FEMORAL;  Surgeon: Rod Can, MD;  Location: [facility];  Service: Orthopedics;  Laterality: Right;  . FRACTURE SURGERY    . HERNIA REPAIR    . HIP ARTHROPLASTY  03/09/2011   Procedure: ARTHROPLASTY BIPOLAR HIP;  Surgeon: Mauri Pole;  Location: [facility];  Service: Orthopedics;  Laterality: Left;  . INSERT / REPLACE / REMOVE PACEMAKER  10/23/2016  . LAPAROSCOPIC INCISIONAL / UMBILICAL / VENTRAL HERNIA REPAIR     "below his naval"  . PACEMAKER IMPLANT N/A 10/23/2016   Procedure: Pacemaker Implant;  Surgeon: Thompson Grayer, MD;  Location: [facility];  Service: Cardiovascular;  Laterality: N/A;     Family History  Problem Relation Age of Onset  . Hypertension Mother   . Lung cancer Father   . Lung cancer Brother      Social History   Socioeconomic History  . Marital status: Married    Spouse name: Not on file  . Number of children: Not on file  . Years of education: Not on file  . Highest education level: Not on file  Occupational History  . Occupation: Retired    Fish farm manager: OTHER  Tobacco Use  . Smoking status: Former Smoker    Quit date: 03/06/1971    Years since quitting: 48.2  . Smokeless tobacco: Never Used  Substance and Sexual Activity  . Alcohol use: Yes    Alcohol/week: 8.0 standard drinks    Types: 7 Glasses of wine, 1 Cans of beer per week    Comment: 1/2 BEER AND  1 WINE  . Drug use: No  . Sexual activity: Never  Other Topics Concern  . Not on file  Social History Narrative   Lives in [place], [place] with wife. Has 3 children.    Social Determinants of Health   Financial Resource Strain:   . Difficulty of Paying Living Expenses:   Food Insecurity:   . Worried About Charity fundraiser in the Last Year:   . Arboriculturist in the Last Year:   Transportation Needs:   . Film/video editor (Medical):   [name] Lack of Transportation (Non-Medical):   Physical Activity:   . Days of Exercise per Week:   .  Minutes of Exercise per Session:   Stress:   . Feeling of Stress :   Social Connections:   . Frequency of Communication with Friends and Family:   . Frequency of Social Gatherings with Friends and Family:   . Attends Religious Services:   . Active Member of Clubs or Organizations:   . Attends Archivist Meetings:   [name] Marital Status:   Intimate Partner Violence:   . Fear of Current or Ex-Partner:   . Emotionally Abused:   [name] Physically Abused:   . Sexually Abused:      Current Meds  Medication Sig  . acetaminophen (TYLENOL) 325 MG tablet Take 2 tablets (650 mg total) every 4 (four) hours as needed by mouth for mild pain (or temp > 37.5 C (99.5 F)).  [name] aspirin EC 81 MG tablet Take 1 tablet (81 mg total) by mouth daily.  [name] atorvastatin (LIPITOR) 80 MG tablet Take 80 mg by mouth daily.  . calcium carbonate (TUMS - DOSED IN MG ELEMENTAL CALCIUM) 500 MG chewable tablet Chew 1 tablet by mouth daily as needed for indigestion or heartburn.  [name] ELIQUIS 5 MG TABS tablet TAKE 1 TABLET BY MOUTH 2 TIMES DAILY.  [name] levETIRAcetam (KEPPRA) 750 MG tablet Take 1 tablet (750 mg total) by mouth 2 (two) times daily.  . Multiple Vitamin (MULTIVITAMIN WITH MINERALS) TABS tablet Take 1 tablet by mouth daily.  . phenylephrine-shark liver oil-mineral oil-petrolatum (PREPARATION H) 0.25-3-14-71.9 % rectal ointment Place 1 application rectally as needed for hemorrhoids.  . polyvinyl alcohol (ARTIFICIAL TEARS) 1.4 % ophthalmic solution Place 1 drop into both eyes daily as needed for dry eyes.  . Pumpkin Seed-Soy Germ (AZO BLADDER CONTROL/GO-LESS PO) Take 2 capsules by mouth 2 (two) times daily.  . QUEtiapine (SEROQUEL) 25 MG tablet TAKE 1 TABLET BY MOUTH AT BEDTIME.  . [DISCONTINUED] nitroGLYCERIN (NITROSTAT) 0.4 MG SL tablet Place 1 tablet (0.4 mg total) under the tongue every 5 (five) minutes as needed for chest pain.    Allergies:  No Known Allergies   Review of Systems: General:   Denies fever,  chills, unexplained weight loss.  Optho/Auditory:   Denies visual changes, blurred vision/LOV Respiratory:   Denies wheeze, DOE more than baseline levels.   Cardiovascular:   Denies chest pain, palpitations, new onset peripheral edema  Gastrointestinal:   Denies nausea, vomiting, diarrhea, abd pain.  Genitourinary: Denies dysuria, freq/ urgency, flank pain or discharge from genitals.  Endocrine:     Denies hot or cold intolerance, polyuria, polydipsia. Musculoskeletal:   Denies unexplained myalgias, joint swelling, unexplained arthralgias, gait problems.  Skin:  Denies new onset rash, suspicious lesions Neurological:     Denies dizziness, unexplained weakness, numbness  Psychiatric/Behavioral:   Denies mood changes, suicidal or homicidal ideations, hallucinations    Objective:  Blood pressure (!) 144/88, pulse 87, temperature (!) 97.5 F (36.4 C), temperature source Oral, resp. rate (!) 22, height 5\' 7"  (1.702 m), SpO2 92 %. Body mass index is 25.37 kg/m. General:  Well Developed, well nourished, appropriate for stated age.  Neuro:  Alert and oriented,  extra-ocular muscles intact  HEENT:  Normocephalic, atraumatic, neck supple Skin:  no gross rash, warm, pink. Cardiac:  RRR, S1 S2 Respiratory:  ECTA B/L and A/P, Not using accessory muscles, speaking in full sentences- unlabored. Vascular:  Ext warm, no cyanosis apprec.; cap RF less 2 sec. Psych:  No HI/SI, judgement and insight good, Euthymic mood. Full Affect.

## 2019-05-25 NOTE — Telephone Encounter (Addendum)
Prescription refill request for Eliquis received.  Last office visit: Nahser 05/06/2019 Scr: 1.03, 05/26/2019 Age: 86 y.o. Weight: 73.5 kg   Prescription refill sent.

## 2019-05-25 NOTE — Patient Instructions (Signed)

## 2019-05-26 ENCOUNTER — Other Ambulatory Visit (INDEPENDENT_AMBULATORY_CARE_PROVIDER_SITE_OTHER): Payer: Medicare Other

## 2019-05-26 ENCOUNTER — Other Ambulatory Visit: Payer: Self-pay | Admitting: Family Medicine

## 2019-05-26 DIAGNOSIS — R0602 Shortness of breath: Secondary | ICD-10-CM

## 2019-05-26 LAB — COMPREHENSIVE METABOLIC PANEL
ALT: 16 IU/L (ref 0–44)
AST: 50 IU/L — ABNORMAL HIGH (ref 0–40)
Albumin/Globulin Ratio: 1.3 (ref 1.2–2.2)
Albumin: 3.5 g/dL — ABNORMAL LOW (ref 3.6–4.6)
Alkaline Phosphatase: 86 IU/L (ref 39–117)
BUN/Creatinine Ratio: 16 (ref 10–24)
BUN: 16 mg/dL (ref 8–27)
Bilirubin Total: 0.4 mg/dL (ref 0.0–1.2)
CO2: 21 mmol/L (ref 20–29)
Calcium: 8.8 mg/dL (ref 8.6–10.2)
Chloride: 102 mmol/L (ref 96–106)
Creatinine, Ser: 1.03 mg/dL (ref 0.76–1.27)
GFR calc Af Amer: 74 mL/min/{1.73_m2} (ref >59)
GFR calc non Af Amer: 64 mL/min/{1.73_m2} (ref >59)
Globulin, Total: 2.6 g/dL (ref 1.5–4.5)
Glucose: 101 mg/dL — ABNORMAL HIGH (ref 65–99)
Potassium: 4.7 mmol/L (ref 3.5–5.2)
Sodium: 133 mmol/L — ABNORMAL LOW (ref 134–144)
Total Protein: 6.1 g/dL (ref 6.0–8.5)

## 2019-05-26 LAB — CBC WITH DIFFERENTIAL/PLATELET
Basophils Absolute: 0.1 10*3/uL (ref 0.0–0.2)
Basos: 1 %
EOS (ABSOLUTE): 0.3 10*3/uL (ref 0.0–0.4)
Eos: 3 %
Hematocrit: 43.2 % (ref 37.5–51.0)
Hemoglobin: 14.6 g/dL (ref 13.0–17.7)
Immature Grans (Abs): 0.1 10*3/uL (ref 0.0–0.1)
Immature Granulocytes: 1 %
Lymphocytes Absolute: 1.5 10*3/uL (ref 0.7–3.1)
Lymphs: 13 %
MCH: 30.4 pg (ref 26.6–33.0)
MCHC: 33.8 g/dL (ref 31.5–35.7)
MCV: 90 fL (ref 79–97)
Monocytes Absolute: 0.8 10*3/uL (ref 0.1–0.9)
Monocytes: 7 %
Neutrophils Absolute: 9.1 10*3/uL — ABNORMAL HIGH (ref 1.4–7.0)
Neutrophils: 75 %
Platelets: 431 10*3/uL (ref 150–450)
RBC: 4.8 x10E6/uL (ref 4.14–5.80)
RDW: 12.9 % (ref 11.6–15.4)
WBC: 11.9 10*3/uL — ABNORMAL HIGH (ref 3.4–10.8)

## 2019-05-26 LAB — POCT URINALYSIS DIPSTICK
Bilirubin, UA: NEGATIVE
Blood, UA: NEGATIVE
Glucose, UA: NEGATIVE
Ketones, UA: NEGATIVE
Nitrite, UA: NEGATIVE
Protein, UA: NEGATIVE
Spec Grav, UA: 1.025 (ref 1.010–1.025)
Urobilinogen, UA: 0.2 E.U./dL
pH, UA: 7 (ref 5.0–8.0)

## 2019-05-26 LAB — HEMOGLOBIN A1C
Est. average glucose Bld gHb Est-mCnc: 123 mg/dL
Hgb A1c MFr Bld: 5.9 % — ABNORMAL HIGH (ref 4.8–5.6)

## 2019-05-26 LAB — LDL CHOLESTEROL, DIRECT: LDL Direct: 57 mg/dL (ref 0–99)

## 2019-05-26 LAB — TSH: TSH: 3.52 u[IU]/mL (ref 0.450–4.500)

## 2019-05-26 NOTE — Progress Notes (Signed)
Return for f/up VERY near future for FBW and obtain UA w possible cx; o/w 4-34mo chronic care.      Patient wife brought in urine for patient for UA per Dr. Raliegh Scarlet. AS, CMA

## 2019-05-26 NOTE — Progress Notes (Signed)
OK. When I say a direct LDL, that is in addition to all the FLP. No problem though. It's OK. Please add a free T4 and T3 to his labs   Message from Dr. Raliegh Scarlet. Called [facility] and added on labs. AS, CMA

## 2019-05-27 LAB — T4, FREE: Free T4: 0.71 ng/dL — ABNORMAL LOW (ref 0.82–1.77)

## 2019-05-27 LAB — SPECIMEN STATUS REPORT

## 2019-05-27 LAB — T3: T3, Total: 74 ng/dL (ref 71–180)

## 2019-06-09 ENCOUNTER — Ambulatory Visit (INDEPENDENT_AMBULATORY_CARE_PROVIDER_SITE_OTHER): Payer: Medicare Other | Admitting: *Deleted

## 2019-06-09 DIAGNOSIS — I441 Atrioventricular block, second degree: Secondary | ICD-10-CM

## 2019-06-09 LAB — CUP PACEART REMOTE DEVICE CHECK
Battery Remaining Longevity: 127 mo
Battery Remaining Percentage: 95.5 %
Battery Voltage: 3.01 V
Brady Statistic AP VP Percent: 14 %
Brady Statistic AP VS Percent: 6.4 %
Brady Statistic AS VP Percent: 1.7 %
Brady Statistic AS VS Percent: 77 %
Brady Statistic RA Percent Paced: 20 %
Brady Statistic RV Percent Paced: 16 %
Date Time Interrogation Session: 20210406032650
Implantable Lead Implant Date: 20180821
Implantable Lead Implant Date: 20180821
Implantable Lead Location: 753859
Implantable Lead Location: 753860
Implantable Pulse Generator Implant Date: 20180821
Lead Channel Impedance Value: 450 Ohm
Lead Channel Impedance Value: 450 Ohm
Lead Channel Pacing Threshold Amplitude: 0.75 V
Lead Channel Pacing Threshold Amplitude: 1 V
Lead Channel Pacing Threshold Pulse Width: 0.5 ms
Lead Channel Pacing Threshold Pulse Width: 0.5 ms
Lead Channel Sensing Intrinsic Amplitude: 2.8 mV
Lead Channel Sensing Intrinsic Amplitude: 8.8 mV
Lead Channel Setting Pacing Amplitude: 1 V
Lead Channel Setting Pacing Amplitude: 2 V
Lead Channel Setting Pacing Pulse Width: 0.5 ms
Lead Channel Setting Sensing Sensitivity: 2 mV
Pulse Gen Model: 2272
Pulse Gen Serial Number: 8933379

## 2019-06-10 NOTE — Progress Notes (Signed)
PPM Remote  

## 2019-06-15 ENCOUNTER — Ambulatory Visit
Admission: RE | Admit: 2019-06-15 | Discharge: 2019-06-15 | Disposition: A | Discharge status: Home / Self Care | Payer: Medicare Other | Source: Ambulatory Visit | Attending: Family Medicine | Admitting: Family Medicine

## 2019-06-15 ENCOUNTER — Encounter (HOSPITAL_COMMUNITY): Payer: Self-pay | Admitting: Emergency Medicine

## 2019-06-15 ENCOUNTER — Inpatient Hospital Stay (HOSPITAL_COMMUNITY)
Admission: EM | Admit: 2019-06-15 | Discharge: 2019-07-04 | DRG: 871 | Disposition: E | Payer: Medicare Other | Attending: Internal Medicine | Admitting: Internal Medicine

## 2019-06-15 ENCOUNTER — Emergency Department (HOSPITAL_COMMUNITY): Payer: Medicare Other

## 2019-06-15 ENCOUNTER — Other Ambulatory Visit: Payer: Self-pay

## 2019-06-15 DIAGNOSIS — I2721 Secondary pulmonary arterial hypertension: Secondary | ICD-10-CM | POA: Diagnosis present

## 2019-06-15 DIAGNOSIS — I69393 Ataxia following cerebral infarction: Secondary | ICD-10-CM | POA: Diagnosis not present

## 2019-06-15 DIAGNOSIS — N400 Enlarged prostate without lower urinary tract symptoms: Secondary | ICD-10-CM | POA: Diagnosis present

## 2019-06-15 DIAGNOSIS — I480 Paroxysmal atrial fibrillation: Secondary | ICD-10-CM | POA: Diagnosis present

## 2019-06-15 DIAGNOSIS — J9602 Acute respiratory failure with hypercapnia: Secondary | ICD-10-CM | POA: Diagnosis not present

## 2019-06-15 DIAGNOSIS — E8770 Fluid overload, unspecified: Secondary | ICD-10-CM | POA: Diagnosis not present

## 2019-06-15 DIAGNOSIS — R609 Edema, unspecified: Secondary | ICD-10-CM | POA: Diagnosis not present

## 2019-06-15 DIAGNOSIS — Z515 Encounter for palliative care: Secondary | ICD-10-CM | POA: Diagnosis not present

## 2019-06-15 DIAGNOSIS — M7989 Other specified soft tissue disorders: Secondary | ICD-10-CM | POA: Diagnosis not present

## 2019-06-15 DIAGNOSIS — Z96642 Presence of left artificial hip joint: Secondary | ICD-10-CM | POA: Diagnosis present

## 2019-06-15 DIAGNOSIS — I129 Hypertensive chronic kidney disease with stage 1 through stage 4 chronic kidney disease, or unspecified chronic kidney disease: Secondary | ICD-10-CM | POA: Diagnosis present

## 2019-06-15 DIAGNOSIS — I471 Supraventricular tachycardia: Secondary | ICD-10-CM | POA: Diagnosis not present

## 2019-06-15 DIAGNOSIS — G40909 Epilepsy, unspecified, not intractable, without status epilepticus: Secondary | ICD-10-CM | POA: Diagnosis present

## 2019-06-15 DIAGNOSIS — Z85828 Personal history of other malignant neoplasm of skin: Secondary | ICD-10-CM

## 2019-06-15 DIAGNOSIS — R6521 Severe sepsis with septic shock: Secondary | ICD-10-CM | POA: Diagnosis present

## 2019-06-15 DIAGNOSIS — H9190 Unspecified hearing loss, unspecified ear: Secondary | ICD-10-CM | POA: Diagnosis present

## 2019-06-15 DIAGNOSIS — J440 Chronic obstructive pulmonary disease with acute lower respiratory infection: Secondary | ICD-10-CM | POA: Diagnosis not present

## 2019-06-15 DIAGNOSIS — Z955 Presence of coronary angioplasty implant and graft: Secondary | ICD-10-CM

## 2019-06-15 DIAGNOSIS — I69351 Hemiplegia and hemiparesis following cerebral infarction affecting right dominant side: Secondary | ICD-10-CM

## 2019-06-15 DIAGNOSIS — I340 Nonrheumatic mitral (valve) insufficiency: Secondary | ICD-10-CM | POA: Diagnosis not present

## 2019-06-15 DIAGNOSIS — R0902 Hypoxemia: Secondary | ICD-10-CM

## 2019-06-15 DIAGNOSIS — Z950 Presence of cardiac pacemaker: Secondary | ICD-10-CM

## 2019-06-15 DIAGNOSIS — I69359 Hemiplegia and hemiparesis following cerebral infarction affecting unspecified side: Secondary | ICD-10-CM

## 2019-06-15 DIAGNOSIS — G9340 Encephalopathy, unspecified: Secondary | ICD-10-CM | POA: Diagnosis not present

## 2019-06-15 DIAGNOSIS — F01518 Vascular dementia, unspecified severity, with other behavioral disturbance: Secondary | ICD-10-CM | POA: Diagnosis present

## 2019-06-15 DIAGNOSIS — A419 Sepsis, unspecified organism: Secondary | ICD-10-CM | POA: Diagnosis not present

## 2019-06-15 DIAGNOSIS — N182 Chronic kidney disease, stage 2 (mild): Secondary | ICD-10-CM | POA: Diagnosis not present

## 2019-06-15 DIAGNOSIS — R918 Other nonspecific abnormal finding of lung field: Secondary | ICD-10-CM | POA: Diagnosis not present

## 2019-06-15 DIAGNOSIS — E1122 Type 2 diabetes mellitus with diabetic chronic kidney disease: Secondary | ICD-10-CM | POA: Diagnosis present

## 2019-06-15 DIAGNOSIS — J810 Acute pulmonary edema: Secondary | ICD-10-CM

## 2019-06-15 DIAGNOSIS — I2510 Atherosclerotic heart disease of native coronary artery without angina pectoris: Secondary | ICD-10-CM | POA: Diagnosis present

## 2019-06-15 DIAGNOSIS — I639 Cerebral infarction, unspecified: Secondary | ICD-10-CM | POA: Diagnosis present

## 2019-06-15 DIAGNOSIS — I10 Essential (primary) hypertension: Secondary | ICD-10-CM | POA: Diagnosis not present

## 2019-06-15 DIAGNOSIS — I4510 Unspecified right bundle-branch block: Secondary | ICD-10-CM | POA: Diagnosis not present

## 2019-06-15 DIAGNOSIS — Z87440 Personal history of urinary (tract) infections: Secondary | ICD-10-CM

## 2019-06-15 DIAGNOSIS — L03115 Cellulitis of right lower limb: Secondary | ICD-10-CM | POA: Diagnosis present

## 2019-06-15 DIAGNOSIS — J189 Pneumonia, unspecified organism: Secondary | ICD-10-CM | POA: Diagnosis not present

## 2019-06-15 DIAGNOSIS — R569 Unspecified convulsions: Secondary | ICD-10-CM | POA: Diagnosis not present

## 2019-06-15 DIAGNOSIS — F05 Delirium due to known physiological condition: Secondary | ICD-10-CM | POA: Diagnosis not present

## 2019-06-15 DIAGNOSIS — Z66 Do not resuscitate: Secondary | ICD-10-CM | POA: Diagnosis present

## 2019-06-15 DIAGNOSIS — J9601 Acute respiratory failure with hypoxia: Secondary | ICD-10-CM

## 2019-06-15 DIAGNOSIS — F0151 Vascular dementia with behavioral disturbance: Secondary | ICD-10-CM | POA: Diagnosis present

## 2019-06-15 DIAGNOSIS — R0600 Dyspnea, unspecified: Secondary | ICD-10-CM | POA: Diagnosis not present

## 2019-06-15 DIAGNOSIS — N390 Urinary tract infection, site not specified: Secondary | ICD-10-CM | POA: Diagnosis not present

## 2019-06-15 DIAGNOSIS — I4581 Long QT syndrome: Secondary | ICD-10-CM | POA: Diagnosis present

## 2019-06-15 DIAGNOSIS — J8410 Pulmonary fibrosis, unspecified: Secondary | ICD-10-CM | POA: Diagnosis present

## 2019-06-15 DIAGNOSIS — J449 Chronic obstructive pulmonary disease, unspecified: Secondary | ICD-10-CM | POA: Diagnosis present

## 2019-06-15 DIAGNOSIS — Z8249 Family history of ischemic heart disease and other diseases of the circulatory system: Secondary | ICD-10-CM

## 2019-06-15 DIAGNOSIS — N3281 Overactive bladder: Secondary | ICD-10-CM | POA: Diagnosis present

## 2019-06-15 DIAGNOSIS — E785 Hyperlipidemia, unspecified: Secondary | ICD-10-CM | POA: Diagnosis present

## 2019-06-15 DIAGNOSIS — I69318 Other symptoms and signs involving cognitive functions following cerebral infarction: Secondary | ICD-10-CM

## 2019-06-15 DIAGNOSIS — Z79899 Other long term (current) drug therapy: Secondary | ICD-10-CM

## 2019-06-15 DIAGNOSIS — R64 Cachexia: Secondary | ICD-10-CM | POA: Diagnosis not present

## 2019-06-15 DIAGNOSIS — I69398 Other sequelae of cerebral infarction: Secondary | ICD-10-CM

## 2019-06-15 DIAGNOSIS — I499 Cardiac arrhythmia, unspecified: Secondary | ICD-10-CM | POA: Diagnosis not present

## 2019-06-15 DIAGNOSIS — Z9841 Cataract extraction status, right eye: Secondary | ICD-10-CM

## 2019-06-15 DIAGNOSIS — Z9842 Cataract extraction status, left eye: Secondary | ICD-10-CM

## 2019-06-15 DIAGNOSIS — Z20822 Contact with and (suspected) exposure to covid-19: Secondary | ICD-10-CM | POA: Diagnosis present

## 2019-06-15 DIAGNOSIS — Z87891 Personal history of nicotine dependence: Secondary | ICD-10-CM

## 2019-06-15 DIAGNOSIS — R05 Cough: Secondary | ICD-10-CM | POA: Diagnosis not present

## 2019-06-15 DIAGNOSIS — Z801 Family history of malignant neoplasm of trachea, bronchus and lung: Secondary | ICD-10-CM

## 2019-06-15 DIAGNOSIS — J181 Lobar pneumonia, unspecified organism: Secondary | ICD-10-CM | POA: Diagnosis not present

## 2019-06-15 DIAGNOSIS — E118 Type 2 diabetes mellitus with unspecified complications: Secondary | ICD-10-CM | POA: Diagnosis not present

## 2019-06-15 DIAGNOSIS — F0390 Unspecified dementia without behavioral disturbance: Secondary | ICD-10-CM | POA: Diagnosis not present

## 2019-06-15 DIAGNOSIS — N1831 Chronic kidney disease, stage 3a: Secondary | ICD-10-CM | POA: Diagnosis present

## 2019-06-15 DIAGNOSIS — I63132 Cerebral infarction due to embolism of left carotid artery: Secondary | ICD-10-CM | POA: Diagnosis not present

## 2019-06-15 DIAGNOSIS — M8949 Other hypertrophic osteoarthropathy, multiple sites: Secondary | ICD-10-CM | POA: Diagnosis present

## 2019-06-15 DIAGNOSIS — Z6823 Body mass index (BMI) 23.0-23.9, adult: Secondary | ICD-10-CM

## 2019-06-15 DIAGNOSIS — Y95 Nosocomial condition: Secondary | ICD-10-CM | POA: Diagnosis not present

## 2019-06-15 DIAGNOSIS — R0602 Shortness of breath: Secondary | ICD-10-CM | POA: Diagnosis not present

## 2019-06-15 DIAGNOSIS — Z7982 Long term (current) use of aspirin: Secondary | ICD-10-CM

## 2019-06-15 DIAGNOSIS — K219 Gastro-esophageal reflux disease without esophagitis: Secondary | ICD-10-CM | POA: Diagnosis present

## 2019-06-15 DIAGNOSIS — Z7901 Long term (current) use of anticoagulants: Secondary | ICD-10-CM

## 2019-06-15 DIAGNOSIS — I69391 Dysphagia following cerebral infarction: Secondary | ICD-10-CM

## 2019-06-15 DIAGNOSIS — I361 Nonrheumatic tricuspid (valve) insufficiency: Secondary | ICD-10-CM | POA: Diagnosis not present

## 2019-06-15 DIAGNOSIS — Z8619 Personal history of other infectious and parasitic diseases: Secondary | ICD-10-CM

## 2019-06-15 DIAGNOSIS — I517 Cardiomegaly: Secondary | ICD-10-CM | POA: Diagnosis not present

## 2019-06-15 DIAGNOSIS — R404 Transient alteration of awareness: Secondary | ICD-10-CM | POA: Diagnosis not present

## 2019-06-15 DIAGNOSIS — I469 Cardiac arrest, cause unspecified: Secondary | ICD-10-CM | POA: Diagnosis not present

## 2019-06-15 DIAGNOSIS — K5900 Constipation, unspecified: Secondary | ICD-10-CM | POA: Diagnosis not present

## 2019-06-15 LAB — CBC WITH DIFFERENTIAL/PLATELET
Abs Immature Granulocytes: 0.1 10*3/uL — ABNORMAL HIGH (ref 0.00–0.07)
Basophils Absolute: 0.1 10*3/uL (ref 0.0–0.1)
Basophils Relative: 1 %
Eosinophils Absolute: 0.1 10*3/uL (ref 0.0–0.5)
Eosinophils Relative: 1 %
HCT: 45.9 % (ref 39.0–52.0)
Hemoglobin: 15.3 g/dL (ref 13.0–17.0)
Immature Granulocytes: 1 %
Lymphocytes Relative: 8 %
Lymphs Abs: 1.4 10*3/uL (ref 0.7–4.0)
MCH: 30.7 pg (ref 26.0–34.0)
MCHC: 33.3 g/dL (ref 30.0–36.0)
MCV: 92.2 fL (ref 80.0–100.0)
Monocytes Absolute: 1.1 10*3/uL — ABNORMAL HIGH (ref 0.1–1.0)
Monocytes Relative: 6 %
Neutro Abs: 14.9 10*3/uL — ABNORMAL HIGH (ref 1.7–7.7)
Neutrophils Relative %: 83 %
Platelets: 404 10*3/uL — ABNORMAL HIGH (ref 150–400)
RBC: 4.98 MIL/uL (ref 4.22–5.81)
RDW: 14.8 % (ref 11.5–15.5)
WBC: 17.6 10*3/uL — ABNORMAL HIGH (ref 4.0–10.5)
nRBC: 0 % (ref 0.0–0.2)

## 2019-06-15 LAB — URINALYSIS, ROUTINE W REFLEX MICROSCOPIC
Bacteria, UA: NONE SEEN
Bilirubin Urine: NEGATIVE
Glucose, UA: NEGATIVE mg/dL
Hgb urine dipstick: NEGATIVE
Ketones, ur: NEGATIVE mg/dL
Nitrite: NEGATIVE
Protein, ur: 30 mg/dL — AB
Specific Gravity, Urine: 1.02 (ref 1.005–1.030)
pH: 5 (ref 5.0–8.0)

## 2019-06-15 LAB — COMPREHENSIVE METABOLIC PANEL
ALT: 19 U/L (ref 0–44)
AST: 53 U/L — ABNORMAL HIGH (ref 15–41)
Albumin: 3.4 g/dL — ABNORMAL LOW (ref 3.5–5.0)
Alkaline Phosphatase: 73 U/L (ref 38–126)
Anion gap: 11 (ref 5–15)
BUN: 26 mg/dL — ABNORMAL HIGH (ref 8–23)
CO2: 21 mmol/L — ABNORMAL LOW (ref 22–32)
Calcium: 8.9 mg/dL (ref 8.9–10.3)
Chloride: 100 mmol/L (ref 98–111)
Creatinine, Ser: 1.16 mg/dL (ref 0.61–1.24)
GFR calc Af Amer: >60 mL/min (ref >60)
GFR calc non Af Amer: 56 mL/min — ABNORMAL LOW (ref >60)
Glucose, Bld: 128 mg/dL — ABNORMAL HIGH (ref 70–99)
Potassium: 4.3 mmol/L (ref 3.5–5.1)
Sodium: 132 mmol/L — ABNORMAL LOW (ref 135–145)
Total Bilirubin: 0.6 mg/dL (ref 0.3–1.2)
Total Protein: 6.6 g/dL (ref 6.5–8.1)

## 2019-06-15 LAB — PROTIME-INR
INR: 1.5 — ABNORMAL HIGH (ref 0.8–1.2)
Prothrombin Time: 18.1 seconds — ABNORMAL HIGH (ref 11.4–15.2)

## 2019-06-15 LAB — APTT: aPTT: 37 seconds — ABNORMAL HIGH (ref 24–36)

## 2019-06-15 LAB — RESPIRATORY PANEL BY RT PCR (FLU A&B, COVID)
Influenza A by PCR: NEGATIVE
Influenza B by PCR: NEGATIVE
SARS Coronavirus 2 by RT PCR: NEGATIVE

## 2019-06-15 LAB — LACTIC ACID, PLASMA
Lactic Acid, Venous: 2.1 mmol/L (ref 0.5–1.9)
Lactic Acid, Venous: 2 mmol/L (ref 0.5–1.9)

## 2019-06-15 MED ORDER — APIXABAN 5 MG PO TABS
5.0000 mg | ORAL_TABLET | Freq: Two times a day (BID) | ORAL | Status: DC
  Administered 2019-06-16 – 2019-06-21 (×13): 5 mg via ORAL
  Filled 2019-06-15 (×14): qty 1

## 2019-06-15 MED ORDER — ATORVASTATIN CALCIUM 80 MG PO TABS
80.0000 mg | ORAL_TABLET | Freq: Every day | ORAL | Status: DC
  Administered 2019-06-16 – 2019-06-20 (×6): 80 mg via ORAL
  Filled 2019-06-15 (×6): qty 1

## 2019-06-15 MED ORDER — LACTATED RINGERS IV BOLUS (SEPSIS)
1000.0000 mL | Freq: Once | INTRAVENOUS | Status: AC
  Administered 2019-06-15: 1000 mL via INTRAVENOUS

## 2019-06-15 MED ORDER — SODIUM CHLORIDE 0.9 % IV SOLN: 2.0000 g | INTRAVENOUS | Status: DC

## 2019-06-15 MED ORDER — CALCIUM CARBONATE ANTACID 500 MG PO CHEW: 1.0000 | CHEWABLE_TABLET | ORAL | Status: DC | PRN

## 2019-06-15 MED ORDER — HYDROCORTISONE (PERIANAL) 2.5 % EX CREA: 1.0000 "application " | TOPICAL_CREAM | CUTANEOUS | Status: DC | PRN

## 2019-06-15 MED ORDER — VANCOMYCIN HCL 1500 MG/300ML IV SOLN
1500.0000 mg | Freq: Once | INTRAVENOUS | Status: AC
  Administered 2019-06-15: 1500 mg via INTRAVENOUS
  Filled 2019-06-15: qty 300

## 2019-06-15 MED ORDER — POLYVINYL ALCOHOL 1.4 % OP SOLN
1.0000 [drp] | Freq: Two times a day (BID) | OPHTHALMIC | Status: DC | PRN
  Filled 2019-06-15: qty 15

## 2019-06-15 MED ORDER — LACTATED RINGERS IV BOLUS (SEPSIS)
250.0000 mL | Freq: Once | INTRAVENOUS | Status: AC
  Administered 2019-06-15: 250 mL via INTRAVENOUS

## 2019-06-15 MED ORDER — ASPIRIN EC 81 MG PO TBEC
81.0000 mg | DELAYED_RELEASE_TABLET | Freq: Every day | ORAL | Status: DC
  Administered 2019-06-16 – 2019-06-21 (×7): 81 mg via ORAL
  Filled 2019-06-15 (×7): qty 1

## 2019-06-15 MED ORDER — ACETAMINOPHEN 650 MG RE SUPP: 650.0000 mg | Freq: Four times a day (QID) | RECTAL | Status: DC | PRN

## 2019-06-15 MED ORDER — VANCOMYCIN HCL IN DEXTROSE 1-5 GM/200ML-% IV SOLN
1000.0000 mg | INTRAVENOUS | Status: DC
  Administered 2019-06-16 – 2019-06-17 (×2): 1000 mg via INTRAVENOUS
  Filled 2019-06-15 (×2): qty 200

## 2019-06-15 MED ORDER — ONDANSETRON HCL 4 MG PO TABS: 4.0000 mg | ORAL_TABLET | Freq: Four times a day (QID) | ORAL | Status: DC | PRN

## 2019-06-15 MED ORDER — ACETAMINOPHEN 325 MG PO TABS
650.0000 mg | ORAL_TABLET | ORAL | Status: DC | PRN
  Administered 2019-06-17: 650 mg via ORAL
  Filled 2019-06-15: qty 2

## 2019-06-15 MED ORDER — SODIUM CHLORIDE 0.9 % IV SOLN
2.0000 g | INTRAVENOUS | Status: DC
  Administered 2019-06-15 – 2019-06-18 (×4): 2 g via INTRAVENOUS
  Filled 2019-06-15 (×3): qty 2
  Filled 2019-06-15 (×2): qty 20

## 2019-06-15 MED ORDER — VANCOMYCIN HCL IN DEXTROSE 1-5 GM/200ML-% IV SOLN
1000.0000 mg | Freq: Once | INTRAVENOUS | Status: AC
  Administered 2019-06-16: 1000 mg via INTRAVENOUS
  Filled 2019-06-15: qty 200

## 2019-06-15 MED ORDER — ADULT MULTIVITAMIN W/MINERALS CH
1.0000 | ORAL_TABLET | Freq: Every day | ORAL | Status: DC
  Administered 2019-06-16 – 2019-06-21 (×5): 1 via ORAL
  Filled 2019-06-15 (×6): qty 1

## 2019-06-15 MED ORDER — NITROGLYCERIN 0.4 MG SL SUBL: 0.4000 mg | SUBLINGUAL_TABLET | SUBLINGUAL | Status: DC | PRN

## 2019-06-15 MED ORDER — QUETIAPINE FUMARATE 25 MG PO TABS
25.0000 mg | ORAL_TABLET | Freq: Every day | ORAL | Status: DC
  Administered 2019-06-16 – 2019-06-20 (×6): 25 mg via ORAL
  Filled 2019-06-15 (×7): qty 1

## 2019-06-15 MED ORDER — ONDANSETRON HCL 4 MG/2ML IJ SOLN: 4.0000 mg | Freq: Four times a day (QID) | INTRAMUSCULAR | Status: DC | PRN

## 2019-06-15 MED ORDER — LEVETIRACETAM 750 MG PO TABS
750.0000 mg | ORAL_TABLET | Freq: Two times a day (BID) | ORAL | Status: DC
  Administered 2019-06-16 – 2019-06-21 (×12): 750 mg via ORAL
  Filled 2019-06-15 (×15): qty 1

## 2019-06-15 MED ORDER — ACETAMINOPHEN 650 MG RE SUPP
650.0000 mg | Freq: Once | RECTAL | Status: AC
  Administered 2019-06-15: 650 mg via RECTAL
  Filled 2019-06-15: qty 1

## 2019-06-15 MED ORDER — ENSURE ENLIVE PO LIQD
237.0000 mL | Freq: Every day | ORAL | Status: DC
  Administered 2019-06-17 – 2019-06-21 (×4): 237 mL via ORAL
  Filled 2019-06-15: qty 237

## 2019-06-15 MED ORDER — SODIUM CHLORIDE 0.9 % IV SOLN: INTRAVENOUS | Status: DC

## 2019-06-15 MED ORDER — ACETAMINOPHEN 325 MG PO TABS: 650.0000 mg | ORAL_TABLET | Freq: Four times a day (QID) | ORAL | Status: DC | PRN

## 2019-06-15 NOTE — Progress Notes (Signed)
Pharmacy Antibiotic Note  Herbert Marquez is a 86 y.o. male admitted on 06/30/2019 with cellulitis.  Pharmacy has been consulted for vancomycin dosing.  Plan: Vancomycin 1500 mg IV x 1, then 1000 mg IV every 24 hours Goal AUC 400-550. Expected AUC: 493 SCr used: 1.16 Monitor renal function, clinical progression and LOT Vancomycin levels at steady state   Height: 5\' 7"  (170.2 cm) Weight: 70.3 kg (155 lb) IBW/kg (Calculated) : 66.1  Temp (24hrs), Avg:102.2 F (39 C), Min:102.2 F (39 C), Max:102.2 F (39 C)  Recent Labs  Lab 06/27/2019 1852  WBC 17.6*  CREATININE 1.16  LATICACIDVEN 2.1*    Estimated Creatinine Clearance: 40.4 mL/min (by C-G formula based on SCr of 1.16 mg/dL).    No Known Allergies Bertis Ruddy, PharmD Clinical Pharmacist ED Pharmacist Phone # [phone] 07/01/2019 8:09 PM

## 2019-06-15 NOTE — Progress Notes (Signed)
Lower extremity venous has been completed.   Preliminary results in CV Proc.   Abram Sander 06/16/2019 7:03 PM

## 2019-06-15 NOTE — ED Notes (Signed)
Pt to ED via [facility] from home with c/o resp distress.  EMS reports on their arrival pt's 02 sats were 97% on R/A.  NRM placed on pt and sat's improved to 92%.   On arrival pt is warm to touch and tachyneic

## 2019-06-15 NOTE — H&P (Signed)
History and Physical   ERDI BEVIL [id number] DOB: [birth year] DOA: 06/04/2019  Referring MD/NP/PA: Dr. Eulis Foster  PCP: [name], DO   Patient coming from: Home  Chief Complaint: Fever and weakness, shortness of breath  HPI: Herbert Marquez is a 86 y.o. male with medical history significant of multiple CVAs with residual effects, hypertension, diabetes, coronary artery disease, paroxysmal atrial fibrillation, BPH, ataxia, COPD, chronic kidney disease stage III who was brought in by his wife secondary to weakness and fever.  Patient also has shortness of breath.  Patient is hard of hearing but fully communicates with wife at the bedside.  Symptoms developed over the last day or so.  His initial sats were in the 80s apparently point was on nonrebreather bag.  He arrived the ER tachypneic.  Patient was noted to be hypotensive has a fever as well as evidence of sepsis.  With this in mind patient was being admitted for further work-up.  He appears to have sepsis from right lower extremity cellulitis.  Possible UTI.  He is now more awake and alert communicating.  He is not as hypoxic as he was..  ED Course: Temperature is 102.2 blood pressure 71/55 initially currently 102/64 pulse 109 respiratory rate of 30 oxygen sat 95% on 2 L.  White count is 17.6 hemoglobin 15.3 and platelets 404.  Sodium 132 potassium 4.3 chloride 100 CO2 21 BUN 26 creatinine 1.16.  Calcium 8.9.  COVID-19 screen is negative.  Analysis showed WBC 11-20 no bacteria seen.  Nitrite and leukocytes essentially negative.  PT is 18.1 INR 1.5.  Sodium 132 potassium 4.3 chloride 100 CO2 21 with glucose 128.  BUN 26 creatinine 1.16 calcium 8.9.  Lactic acid is 2.1-second time 2.0.  Patient is being admitted with sepsis.  Chest x-ray showed no acute findings.  Review of Systems: As per HPI otherwise 10 point review of systems negative.    Past Medical History:  Diagnosis Date  . Acute blood loss anemia   . Acute encephalopathy  06/04/2016  . Acute ischemic stroke ([place]) - L temporal lobe s/p tPA 03/30/2016  . Altered mental status   . Arthritis    "pretty much all over"   . 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 Atrial fibrillation with rapid ventricular response ([place])   . Basal cell carcinoma    "several burned off his body, face, head"  . Benign essential HTN   . BPH (benign prostatic hypertrophy)   . Cerebral hemorrhage (HCC) w/ SDH s/p IV tPA   . Cerebral thrombosis with cerebral infarction 10/05/2017  . Cerebrovascular accident (CVA) ([place]) 09/18/2015  . Chronic anticoagulation   . CKD (chronic kidney disease), stage II 11/25/2015  . COPD (chronic obstructive pulmonary disease) ([place]) 03/09/2011  . Coronary artery disease    a. s/p PCI of RCA in 2006  . Coronary artery disease involving native coronary artery of native heart without angina pectoris   . CVA (cerebral infarction)    a. 06/2015: left thalamic and bilateral PCA  . Dementia without behavioral disturbance ([place])   . Dysphagia as late effect of cerebrovascular disease   . Embolic stroke ([place]) [id number]  . Expressive aphasia 10/04/2017  . Gait disturbance, post-stroke 06/23/2015  . GERD (gastroesophageal reflux disease)   . Hemiparesis and other late effects of cerebrovascular accident ([place]) 06/29/2016  . History of stroke 04/04/2016  . HLD (hyperlipidemia)   . Hyperlipidemia   . Hypertension   . Ischemic stroke ([place])   . Low  blood pressure reading 10/14/2017  . Orthostatic hypotension   . PAF (paroxysmal atrial fibrillation) ([place])   . Presence of permanent cardiac pacemaker   . Right hemiparesis ([place])   . Second degree Mobitz II AV block 10/23/2016  . Seizures ([place])   . Stroke ([place])   . Thalamic infarction ([place]) 06/21/2015  . TIA (transient ischemic attack)    Approximately 6 weeks post-cardiac catheterization.   . Type 2 diabetes mellitus with complication, without long-term current use of insulin ([place])   . Vascular dementia with behavior  disturbance ([place]) 06/29/2016    Past Surgical History:  Procedure Laterality Date  . CARDIOVASCULAR STRESS TEST  07/01/2007   EF 74%  . CATARACT EXTRACTION, BILATERAL    . CORONARY ANGIOPLASTY WITH STENT PLACEMENT  10/2004   stenting x 2 to RCA  . FEMUR IM NAIL Right 11/26/2015   Procedure: INTRAMEDULLARY RIGHT (IM) NAIL FEMORAL;  Surgeon: Rod Can, MD;  Location: [facility];  Service: Orthopedics;  Laterality: Right;  . FRACTURE SURGERY    . HERNIA REPAIR    . HIP ARTHROPLASTY  03/09/2011   Procedure: ARTHROPLASTY BIPOLAR HIP;  Surgeon: Mauri Pole;  Location: [facility];  Service: Orthopedics;  Laterality: Left;  . INSERT / REPLACE / REMOVE PACEMAKER  10/23/2016  . LAPAROSCOPIC INCISIONAL / UMBILICAL / VENTRAL HERNIA REPAIR     "below his naval"  . PACEMAKER IMPLANT N/A 10/23/2016   Procedure: Pacemaker Implant;  Surgeon: Thompson Grayer, MD;  Location: [facility];  Service: Cardiovascular;  Laterality: N/A;     reports that he quit smoking about 48 years ago. He has never used smokeless tobacco. He reports current alcohol use of about 8.0 standard drinks of alcohol per week. He reports that he does not use drugs.  No Known Allergies  Family History  Problem Relation Age of Onset  . Hypertension Mother   . Lung cancer Father   . Lung cancer Brother      Prior to Admission medications   Medication Sig Start Date End Date Taking? Authorizing Provider  acetaminophen (TYLENOL) 325 MG tablet Take 2 tablets (650 mg total) every 4 (four) hours as needed by mouth for mild pain (or temp > 37.5 C (99.5 F)). 01/10/17  Yes Regalado, Belkys A, MD  aspirin EC 81 MG tablet Take 1 tablet (81 mg total) by mouth daily. Patient taking differently: Take 81 mg by mouth at bedtime.  10/09/16  Yes Rosalin Hawking, MD  atorvastatin (LIPITOR) 80 MG tablet Take 80 mg by mouth at bedtime.    Yes [provider]  calcium carbonate (TUMS - DOSED IN MG ELEMENTAL CALCIUM) 500 MG chewable tablet Chew 1  tablet by mouth as needed for indigestion or heartburn.    Yes [provider]  Camphor-Eucalyptus-Menthol (VICKS VAPORUB EX) Place 1 application into both nostrils at bedtime as needed (for nasal congestion).   Yes [provider]  ELIQUIS 5 MG TABS tablet TAKE 1 TABLET BY MOUTH 2 TIMES DAILY. Patient taking differently: Take 5 mg by mouth 2 (two) times daily.  05/26/19  Yes Nahser, Wonda Cheng, MD  Ensure (ENSURE) Take 237 mLs by mouth daily with lunch.   Yes [provider]  levETIRAcetam (KEPPRA) 750 MG tablet Take 1 tablet (750 mg total) by mouth 2 (two) times daily. 04/02/19  Yes McCue, Janett Billow, NP  Multiple Vitamin (MULTIVITAMIN WITH MINERALS) TABS tablet Take 1 tablet by mouth daily with breakfast.    Yes [provider]  nitroGLYCERIN (NITROSTAT) 0.4 MG SL tablet PLACE 1 TABLET UNDER THE TONGUE EVERY 5 MINUTES AS NEEDED FOR CHEST PAIN. FOR UP TO 3 DOSES. Patient taking differently: Place 0.4 mg under the tongue every 5 (five) minutes x 3 doses as needed for chest pain.  05/25/19  Yes Nahser, Wonda Cheng, MD  phenylephrine-shark liver oil-mineral oil-petrolatum (PREPARATION H) 0.25-3-14-71.9 % rectal ointment Place 1 application rectally as needed for hemorrhoids.   Yes [provider]  polyvinyl alcohol (ARTIFICIAL TEARS) 1.4 % ophthalmic solution Place 1 drop into both eyes 2 (two) times daily as needed for dry eyes.    Yes [provider]  Pumpkin Seed-Soy Germ (AZO BLADDER CONTROL/GO-LESS PO) Take 1-2 capsules by mouth daily as needed (to decrease urination).    Yes [provider]  QUEtiapine (SEROQUEL) 25 MG tablet TAKE 1 TABLET BY MOUTH AT BEDTIME. Patient taking differently: Take 25 mg by mouth at bedtime.  04/13/19  Yes Mina Marble D, NP    Physical Exam: Vitals:   06/17/2019 2200 06/16/2019 2215 06/25/2019 2230 06/21/2019 2300  BP: 115/71 111/66 98/67 102/64  Pulse: 77 78 73 77  Resp: (!) 21 20 (!) 23 17  Temp:      TempSrc:        SpO2: 100% 100% 100% 97%  Weight:      Height:          Constitutional: Chronically ill looking, weak, hard of hearing Vitals:   06/25/2019 2200 06/12/2019 2215 06/11/2019 2230 06/17/2019 2300  BP: 115/71 111/66 98/67 102/64  Pulse: 77 78 73 77  Resp: (!) 21 20 (!) 23 17  Temp:      TempSrc:      SpO2: 100% 100% 100% 97%  Weight:      Height:       Eyes: PERRL, lids and conjunctivae normal ENMT: Mucous membranes are dry. Posterior pharynx clear of any exudate or lesions.Normal dentition.  Neck: normal, supple, no masses, no thyromegaly Respiratory: clear to auscultation bilaterally, no wheezing, no crackles. Normal respiratory effort. No accessory muscle use.  Cardiovascular: Sinus tachycardia, no murmurs / rubs / gallops.  1+ extremity edema. 2+ pedal pulses. No carotid bruits.  Abdomen: no tenderness, no masses palpated. No hepatosplenomegaly. Bowel sounds positive.  Musculoskeletal: no clubbing / cyanosis. No joint deformity upper and lower extremities. Good ROM, no contractures. Normal muscle tone.  Marked muscle wasting Skin: Right lower extremity redness swollen mild edema warm to touch between the ankle and the knee joint no rashes, lesions, ulcers. No induration Neurologic: CN 2-12 grossly intact. Sensation intact, DTR normal. Strength 5/5 in all 4.  Psychiatric: Normal judgment and insight. Alert and oriented x 3. Normal mood.     Labs on Admission: I have personally reviewed following labs and imaging studies  CBC: Recent Labs  Lab 06/18/2019 1852  WBC 17.6*  NEUTROABS 14.9*  HGB 15.3  HCT 45.9  MCV 92.2  PLT [id number]*   Basic Metabolic Panel: Recent Labs  Lab 07/03/2019 1852  NA 132*  K 4.3  CL 100  CO2 21*  GLUCOSE 128*  BUN 26*  CREATININE 1.16  CALCIUM 8.9   GFR: Estimated Creatinine Clearance: 40.4 mL/min (by C-G formula based on SCr of 1.16 mg/dL). Liver Function Tests: Recent Labs  Lab 06/22/2019 1852  AST 53*  ALT 19  ALKPHOS 73  BILITOT 0.6  PROT  6.6  ALBUMIN 3.4*   No results for input(s): LIPASE, AMYLASE in the last 168 hours. No results for  input(s): AMMONIA in the last 168 hours. Coagulation Profile: Recent Labs  Lab 06/11/2019 1900  INR 1.5*   Cardiac Enzymes: No results for input(s): CKTOTAL, CKMB, CKMBINDEX, TROPONINI in the last 168 hours. BNP (last 3 results) No results for input(s): PROBNP in the last 8760 hours. HbA1C: No results for input(s): HGBA1C in the last 72 hours. CBG: No results for input(s): GLUCAP in the last 168 hours. Lipid Profile: No results for input(s): CHOL, HDL, LDLCALC, TRIG, CHOLHDL, LDLDIRECT in the last 72 hours. Thyroid Function Tests: No results for input(s): TSH, T4TOTAL, FREET4, T3FREE, THYROIDAB in the last 72 hours. Anemia Panel: No results for input(s): VITAMINB12, FOLATE, FERRITIN, TIBC, IRON, RETICCTPCT in the last 72 hours. Urine analysis:    Component Value Date/Time   COLORURINE AMBER (A) 06/17/2019 1944   APPEARANCEUR HAZY (A) 06/09/2019 1944   LABSPEC 1.020 06/17/2019 1944   PHURINE 5.0 06/17/2019 1944   GLUCOSEU NEGATIVE 06/04/2019 1944   HGBUR NEGATIVE 07/02/2019 1944   BILIRUBINUR NEGATIVE 07/01/2019 1944   BILIRUBINUR neg 05/26/2019 1146   KETONESUR NEGATIVE 07/02/2019 1944   PROTEINUR 30 (A) 06/28/2019 1944   UROBILINOGEN 0.2 05/26/2019 1146   UROBILINOGEN 0.2 08/31/2011 1033   NITRITE NEGATIVE 06/08/2019 1944   LEUKOCYTESUR TRACE (A) 06/16/2019 1944   Sepsis Labs: @LABRCNTIP (procalcitonin:4,lacticidven:4) ) Recent Results (from the past 240 hour(s))  Respiratory Panel by RT PCR (Flu A&B, Covid) - Nasopharyngeal Swab     Status: None   Collection Time: 06/29/2019  7:34 PM   Specimen: Nasopharyngeal Swab  Result Value Ref Range Status   SARS Coronavirus 2 by RT PCR NEGATIVE NEGATIVE Final    Comment: (NOTE) SARS-CoV-2 target nucleic acids are NOT DETECTED. The SARS-CoV-2 RNA is generally detectable in upper respiratoy specimens during the acute phase of  infection. The lowest concentration of SARS-CoV-2 viral copies this assay can detect is 131 copies/mL. A negative result does not preclude SARS-Cov-2 infection and should not be used as the sole basis for treatment or other patient management decisions. A negative result may occur with  improper specimen collection/handling, submission of specimen other than nasopharyngeal swab, presence of viral mutation(s) within the areas targeted by this assay, and inadequate number of viral copies (<131 copies/mL). A negative result must be combined with clinical observations, patient history, and epidemiological information. The expected result is Negative. Fact Sheet for Patients:  PinkCheek.be Fact Sheet for Healthcare Providers:  GravelBags.it This test is not yet ap proved or cleared by the [place] FDA and  has been authorized for detection and/or diagnosis of SARS-CoV-2 by FDA under an Emergency Use Authorization (EUA). This EUA will remain  in effect (meaning this test can be used) for the duration of the COVID-19 declaration under Section 564(b)(1) of the Act, 21 U.S.C. section 360bbb-3(b)(1), unless the authorization is terminated or revoked sooner.    Influenza A by PCR NEGATIVE NEGATIVE Final   Influenza B by PCR NEGATIVE NEGATIVE Final    Comment: (NOTE) The Xpert Xpress SARS-CoV-2/FLU/RSV assay is intended as an aid in  the diagnosis of influenza from Nasopharyngeal swab specimens and  should not be used as a sole basis for treatment. Nasal washings and  aspirates are unacceptable for Xpert Xpress SARS-CoV-2/FLU/RSV  testing. Fact Sheet for Patients: PinkCheek.be Fact Sheet for Healthcare Providers: GravelBags.it This test is not yet approved or cleared by the [place] FDA and  has been authorized for detection and/or diagnosis of SARS-CoV-2 by  FDA under  an Emergency Use Authorization (EUA). This  EUA will remain  in effect (meaning this test can be used) for the duration of the  Covid-19 declaration under Section 564(b)(1) of the Act, 21  U.S.C. section 360bbb-3(b)(1), unless the authorization is  terminated or revoked. Performed at [facility], [place] [address], [place], [place] [address]      Radiological Exams on Admission: DG Chest Port 1 View  Result Date: 06/06/2019 CLINICAL DATA:  Short of breath EXAM: PORTABLE CHEST 1 VIEW COMPARISON:  10/04/2017 FINDINGS: Single frontal view of the chest demonstrates dual lead pacemaker unchanged. Cardiac silhouette is stable. There is progressive scarring and fibrosis throughout the lungs, without acute airspace disease, effusion, or pneumothorax. No acute bony abnormalities. IMPRESSION: 1. Progressive scarring and fibrosis. No superimposed airspace disease. Electronically Signed   By: Randa Ngo M.D.   On: 06/26/2019 19:18   VAS [place] LOWER EXTREMITY VENOUS (DVT) (ONLY [facility] & [facility])  Result Date: 06/12/2019  Lower Venous DVTStudy Indications: Swelling, and Edema.  Comparison Study: no prior Performing Technologist: Abram Sander RVS  Examination Guidelines: A complete evaluation includes B-mode imaging, spectral Doppler, color Doppler, and power Doppler as needed of all accessible portions of each vessel. Bilateral testing is considered an integral part of a complete examination. Limited examinations for reoccurring indications may be performed as noted. The reflux portion of the exam is performed with the patient in reverse Trendelenburg.  +---------+---------------+---------+-----------+----------+--------------+ RIGHT    CompressibilityPhasicitySpontaneityPropertiesThrombus Aging +---------+---------------+---------+-----------+----------+--------------+ CFV      Full           Yes      Yes                                  +---------+---------------+---------+-----------+----------+--------------+ SFJ      Full                                                        +---------+---------------+---------+-----------+----------+--------------+ FV Prox  Full                                                        +---------+---------------+---------+-----------+----------+--------------+ FV Mid   Full                                                        +---------+---------------+---------+-----------+----------+--------------+ FV DistalFull                                                        +---------+---------------+---------+-----------+----------+--------------+ PFV      Full                                                        +---------+---------------+---------+-----------+----------+--------------+  POP      Full           Yes      Yes                  limited vis    +---------+---------------+---------+-----------+----------+--------------+ PTV      Full                                                        +---------+---------------+---------+-----------+----------+--------------+ PERO                                                  Not visualized +---------+---------------+---------+-----------+----------+--------------+     Summary: RIGHT: - There is no evidence of deep vein thrombosis in the lower extremity.  - No cystic structure found in the popliteal fossa.   *See table(s) above for measurements and observations.    Preliminary     EKG: Independently reviewed.  It shows paced rhythm with a rate of 107 right bundle branch blocks and nonspecific ST changes.  Assessment/Plan Principal Problem:   Sepsis ([place]) Active Problems:   COPD (chronic obstructive pulmonary disease) (HCC)   Benign essential HTN   Type 2 diabetes mellitus with complication, without long-term current use of insulin (HCC)   Ataxia due to recent stroke   BPH (benign prostatic  hyperplasia)   GERD (gastroesophageal reflux disease)   CKD (chronic kidney disease), stage II   Coronary artery disease involving native coronary artery of native heart without angina pectoris   PAF (paroxysmal atrial fibrillation) (HCC)   Dementia without behavioral disturbance (HCC)   Seizures ([place])   Presence of permanent cardiac pacemaker   Cellulitis of right leg     #1 sepsis with hypotension: Most likely secondary to cellulitis.  UTI unlikely with patient is responding to initial bolus of IV fluids.  Initiated on IV antibiotics and continue with fluid resuscitation.  #2 right lower extremity cellulitis: Continue antibiotics as above.  No evidence of DVT.  #3 chronic kidney disease stage II: Renal function at baseline.  #4 history of CVAs: Patient has recovered mostly.  Some residual weakness.  #5 GERD: Continue with PPIs.  #6 COPD: Patient is breathing better.  No decompensation.  Oxygen as needed  #7 seizure disorder: Resume home regimen and maintain on home therapy.  #8 paroxysmal atrial fibrillation: Currently paced rhythm.  On chronic anticoagulation.  Rate is controlled in the morning.   DVT prophylaxis: Eliquis Code Status: DNR Family Communication: Wife at bedside Disposition Plan: To be determined Consults called: None Admission status: Inpatient to progressive care  Severity of Illness: The appropriate patient status for this patient is INPATIENT. Inpatient status is judged to be reasonable and necessary in order to provide the required intensity of service to ensure the patient's safety. The patient's presenting symptoms, physical exam findings, and initial radiographic and laboratory data in the context of their chronic comorbidities is felt to place them at high risk for further clinical deterioration. Furthermore, it is not anticipated that the patient will be medically stable for discharge from the hospital within 2 midnights of admission. The following  factors support the patient status of inpatient.   " The patient's  presenting symptoms include shortness of breath and weakness. " The worrisome physical exam findings include evidence cellulitis and generalized debility. " The initial radiographic and laboratory data are worrisome because of evidence of sepsis. " The chronic co-morbidities include multiple CVA.   * I certify that at the point of admission it is my clinical judgment that the patient will require inpatient hospital care spanning beyond 2 midnights from the point of admission due to high intensity of service, high risk for further deterioration and high frequency of surveillance required.Barbette Merino MD [facility] Pager [phone]  If 7PM-7AM, please contact night-coverage [email or web address] Password [facility]  07/03/2019, 11:31 PM

## 2019-06-15 NOTE — ED Provider Notes (Signed)
[facility] EMERGENCY DEPARTMENT Provider Note   CSN: [id number] Arrival date & time: 06/09/2019  1826     History Chief Complaint  Patient presents with  . Respiratory Distress    Herbert Marquez is a 86 y.o. male.  HPI He arrives by EMS for evaluation treatment of shortness of breath.  He is reported to be cyanotic and tachypneic, with an oxygen saturation of 79% in the field.  Patient is very hard of hearing.  He reports after being started on facemask oxygen, at 100%, he feels "much better."   Level V Caveat 5 caveat-severe illness    Past Medical History:  Diagnosis Date  . Acute blood loss anemia   . Acute encephalopathy 06/04/2016  . Acute ischemic stroke ([place]) - L temporal lobe s/p tPA 03/30/2016  . Altered mental status   . Arthritis    "pretty much all over"   . 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 Atrial fibrillation with rapid ventricular response ([place])   . Basal cell carcinoma    "several burned off his body, face, head"  . Benign essential HTN   . BPH (benign prostatic hypertrophy)   . Cerebral hemorrhage (HCC) w/ SDH s/p IV tPA   . Cerebral thrombosis with cerebral infarction 10/05/2017  . Cerebrovascular accident (CVA) ([place]) 09/18/2015  . Chronic anticoagulation   . CKD (chronic kidney disease), stage II 11/25/2015  . COPD (chronic obstructive pulmonary disease) ([place]) 03/09/2011  . Coronary artery disease    a. s/p PCI of RCA in 2006  . Coronary artery disease involving native coronary artery of native heart without angina pectoris   . CVA (cerebral infarction)    a. 06/2015: left thalamic and bilateral PCA  . Dementia without behavioral disturbance ([place])   . Dysphagia as late effect of cerebrovascular disease   . Embolic stroke ([place]) [id number]  . Expressive aphasia 10/04/2017  . Gait disturbance, post-stroke 06/23/2015  . GERD (gastroesophageal reflux disease)   . Hemiparesis and other late effects of cerebrovascular accident ([place])  06/29/2016  . History of stroke 04/04/2016  . HLD (hyperlipidemia)   . Hyperlipidemia   . Hypertension   . Ischemic stroke ([place])   . Low blood pressure reading 10/14/2017  . Orthostatic hypotension   . PAF (paroxysmal atrial fibrillation) ([place])   . Presence of permanent cardiac pacemaker   . Right hemiparesis ([place])   . Second degree Mob[place] II AV block 10/23/2016  . Seizures ([place])   . Stroke ([place])   . Thalamic infarction ([place]) 06/21/2015  . TIA (transient ischemic attack)    Approximately 6 weeks post-cardiac catheterization.   . Type 2 diabetes mellitus with complication, without long-term current use of insulin ([place])   . Vascular dementia with behavior disturbance ([place]) 06/29/2016    Patient Active Problem List   Diagnosis Date Noted  . Encounter for Medicare annual wellness exam 06/26/2018  . Low blood pressure reading 10/14/2017  . Cerebral thrombosis with cerebral infarction 10/05/2017  . Expressive aphasia 10/04/2017  . TIA (transient ischemic attack)   . Presence of permanent cardiac pacemaker   . Hypertension   . Hyperlipidemia   . Coronary artery disease   . Basal cell carcinoma   . Arthritis   . Pressure injury of skin 01/08/2017  . Stroke ([place]) 01/07/2017  . Ischemic stroke ([place])   . Second degree Mobitz II AV block 10/23/2016  . Seizures ([place]) 10/09/2016  . Healthcare maintenance 10/01/2016  . Restlessness and agitation 07/02/2016  .  Vascular dementia with behavior disturbance ([place]) 06/29/2016  . Hemiparesis and other late effects of cerebrovascular accident ([place]) 06/29/2016  . Embolic stroke ([place]) 0000000  . Right hemiparesis ([place])   . History of CVA with residual deficit   . Chronic anticoagulation   . Atrial fibrillation with rapid ventricular response ([place])   . Seizure prophylaxis   . Diastolic dysfunction   . Bacteremia due to Gram-positive bacteria   . Altered mental status   . Dementia without behavioral disturbance ([place])   . Leukocytosis   . Acute  blood loss anemia   . Acute encephalopathy 06/04/2016  . PAF (paroxysmal atrial fibrillation) ([place])   . Iron deficiency anemia 04/04/2016  . History of stroke 04/04/2016  . Cerebral hemorrhage (HCC) w/ SDH s/p IV tPA   . Coronary artery disease involving native coronary artery of native heart without angina pectoris   . Orthostatic hypotension   . History of right hip replacement   . Acute ischemic stroke ([place]) - L temporal lobe s/p tPA 03/30/2016  . BPH (benign prostatic hyperplasia) 11/25/2015  . GERD (gastroesophageal reflux disease) 11/25/2015  . CKD (chronic kidney disease), stage II 11/25/2015  . Overactive bladder   . Cerebrovascular accident (CVA) ([place]) 09/18/2015  . Gait disturbance, post-stroke 06/23/2015  . 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 Thalamic infarction ([place]) 06/21/2015  . Benign essential HTN   . Type 2 diabetes mellitus with complication, without long-term current use of insulin ([place])   . Dysphagia as late effect of cerebrovascular disease   . Hyponatremia   . HLD (hyperlipidemia)   . COPD (chronic obstructive pulmonary disease) ([place]) 03/09/2011    Past Surgical History:  Procedure Laterality Date  . CARDIOVASCULAR STRESS TEST  07/01/2007   EF 74%  . CATARACT EXTRACTION, BILATERAL    . CORONARY ANGIOPLASTY WITH STENT PLACEMENT  10/2004   stenting x 2 to RCA  . FEMUR IM NAIL Right 11/26/2015   Procedure: INTRAMEDULLARY RIGHT (IM) NAIL FEMORAL;  Surgeon: Rod Can, MD;  Location: [facility];  Service: Orthopedics;  Laterality: Right;  . FRACTURE SURGERY    . HERNIA REPAIR    . HIP ARTHROPLASTY  03/09/2011   Procedure: ARTHROPLASTY BIPOLAR HIP;  Surgeon: Mauri Pole;  Location: [facility];  Service: Orthopedics;  Laterality: Left;  . INSERT / REPLACE / REMOVE PACEMAKER  10/23/2016  . LAPAROSCOPIC INCISIONAL / UMBILICAL / VENTRAL HERNIA REPAIR     "below his naval"  . PACEMAKER IMPLANT N/A 10/23/2016   Procedure: Pacemaker Implant;  Surgeon: Thompson Grayer, MD;  Location: [facility];  Service: Cardiovascular;  Laterality: N/A;       Family History  Problem Relation Age of Onset  . Hypertension Mother   . Lung cancer Father   . Lung cancer Brother     Social History   Tobacco Use  . Smoking status: Former Smoker    Quit date: 03/06/1971    Years since quitting: 48.3  . Smokeless tobacco: Never Used  Substance Use Topics  . Alcohol use: Yes    Alcohol/week: 8.0 standard drinks    Types: 7 Glasses of wine, 1 Cans of beer per week    Comment: 1/2 BEER AND 1 WINE  . Drug use: No    Home Medications Prior to Admission medications   Medication Sig Start Date End Date Taking? Authorizing Provider  acetaminophen (TYLENOL) 325 MG tablet Take 2 tablets (650 mg total) every 4 (four) hours as needed by mouth  for mild pain (or temp > 37.5 C (99.5 F)). 01/10/17  Yes Regalado, Belkys A, MD  aspirin EC 81 MG tablet Take 1 tablet (81 mg total) by mouth daily. Patient taking differently: Take 81 mg by mouth at bedtime.  10/09/16  Yes Rosalin Hawking, MD  atorvastatin (LIPITOR) 80 MG tablet Take 80 mg by mouth at bedtime.    Yes [provider]  ELIQUIS 5 MG TABS tablet TAKE 1 TABLET BY MOUTH 2 TIMES DAILY. Patient taking differently: Take 5 mg by mouth 2 (two) times daily.  05/26/19  Yes Nahser, Wonda Cheng, MD  Ensure (ENSURE) Take 237 mLs by mouth daily with lunch.   Yes [provider]  levETIRAcetam (KEPPRA) 750 MG tablet Take 1 tablet (750 mg total) by mouth 2 (two) times daily. 04/02/19  Yes McCue, Janett Billow, NP  Multiple Vitamin (MULTIVITAMIN WITH MINERALS) TABS tablet Take 1 tablet by mouth daily with breakfast.    Yes [provider]  nitroGLYCERIN (NITROSTAT) 0.4 MG SL tablet PLACE 1 TABLET UNDER THE TONGUE EVERY 5 MINUTES AS NEEDED FOR CHEST PAIN. FOR UP TO 3 DOSES. Patient taking differently: Place 0.4 mg under the tongue every 5 (five) minutes x 3 doses as needed for chest pain.  05/25/19  Yes Nahser, Wonda Cheng,  MD  phenylephrine-shark liver oil-mineral oil-petrolatum (PREPARATION H) 0.25-3-14-71.9 % rectal ointment Place 1 application rectally as needed for hemorrhoids.   Yes [provider]  polyvinyl alcohol (ARTIFICIAL TEARS) 1.4 % ophthalmic solution Place 1 drop into both eyes 2 (two) times daily as needed for dry eyes.    Yes [provider]  Pumpkin Seed-Soy Germ (AZO BLADDER CONTROL/GO-LESS PO) Take 1-2 capsules by mouth daily as needed.    Yes [provider]  QUEtiapine (SEROQUEL) 25 MG tablet TAKE 1 TABLET BY MOUTH AT BEDTIME. Patient taking differently: Take by mouth at bedtime.  04/13/19  Yes Danford, Valetta Fuller D, NP  calcium carbonate (TUMS - DOSED IN MG ELEMENTAL CALCIUM) 500 MG chewable tablet Chew 1 tablet by mouth as needed for indigestion or heartburn.     [provider]    Allergies    Patient has no known allergies.  Review of Systems   Review of Systems  Unable to perform ROS: Acuity of condition    Physical Exam Updated Vital Signs BP 98/67   Pulse 73   Temp (!) 100.9 F (38.3 C) (Rectal)   Resp (!) 23   Ht 5\' 7"  (1.702 m)   Wt 70.3 kg   SpO2 100%   BMI 24.28 kg/m   Physical Exam Vitals and nursing note reviewed.  Constitutional:      General: He is not in acute distress.    Appearance: He is well-developed. He is ill-appearing. He is not toxic-appearing or diaphoretic.  HENT:     Head: Normocephalic and atraumatic.     Right Ear: External ear normal.     Left Ear: External ear normal.  Eyes:     Conjunctiva/sclera: Conjunctivae normal.     Pupils: Pupils are equal, round, and reactive to light.  Neck:     Trachea: Phonation normal.  Cardiovascular:     Rate and Rhythm: Regular rhythm. Tachycardia present.     Heart sounds: Normal heart sounds.     Comments: JVD present, semirecumbent. Pulmonary:     Effort: Pulmonary effort is normal. No respiratory distress.     Breath sounds: No stridor.     Comments:  Tachypneic Abdominal:  Palpations: Abdomen is soft.     Tenderness: There is no abdominal tenderness.  Musculoskeletal:     Cervical back: Normal range of motion and neck supple.     Comments: Right lower leg swollen, with erythema, nonspecific appearance anteriorly.  No drainage or bleeding.  Skin:    General: Skin is warm and dry.  Neurological:     Mental Status: He is alert.     Cranial Nerves: No cranial nerve deficit.     Sensory: No sensory deficit.     Motor: No abnormal muscle tone.     Coordination: Coordination normal.  Psychiatric:        Mood and Affect: Mood normal.        Behavior: Behavior normal.        Thought Content: Thought content normal.        Judgment: Judgment normal.     ED Results / Procedures / Treatments   Labs (all labs ordered are listed, but only abnormal results are displayed) Labs Reviewed  CBC WITH DIFFERENTIAL/PLATELET - Abnormal; Notable for the following components:      Result Value   WBC 17.6 (*)    Platelets 404 (*)    Neutro Abs 14.9 (*)    Monocytes Absolute 1.1 (*)    Abs Immature Granulocytes 0.10 (*)    All other components within normal limits  COMPREHENSIVE METABOLIC PANEL - Abnormal; Notable for the following components:   Sodium 132 (*)    CO2 21 (*)    Glucose, Bld 128 (*)    BUN 26 (*)    Albumin 3.4 (*)    AST 53 (*)    GFR calc non Af Amer 56 (*)    All other components within normal limits  LACTIC ACID, PLASMA - Abnormal; Notable for the following components:   Lactic Acid, Venous 2.1 (*)    All other components within normal limits  LACTIC ACID, PLASMA - Abnormal; Notable for the following components:   Lactic Acid, Venous 2.0 (*)    All other components within normal limits  APTT - Abnormal; Notable for the following components:   aPTT 37 (*)    All other components within normal limits  PROTIME-INR - Abnormal; Notable for the following components:   Prothrombin Time 18.1 (*)    INR 1.5 (*)    All  other components within normal limits  URINALYSIS, ROUTINE W REFLEX MICROSCOPIC - Abnormal; Notable for the following components:   Color, Urine AMBER (*)    APPearance HAZY (*)    Protein, ur 30 (*)    Leukocytes,Ua TRACE (*)    All other components within normal limits  RESPIRATORY PANEL BY RT PCR (FLU A&B, COVID)  CULTURE, BLOOD (ROUTINE X 2)  CULTURE, BLOOD (ROUTINE X 2)  CULTURE, BLOOD (ROUTINE X 2)  CULTURE, BLOOD (ROUTINE X 2)  URINE CULTURE  MRSA PCR SCREENING    EKG None    Date: 06/06/2019  Rate: 107  Rhythm: indeterminate  QRS Axis: left  PR and QT Intervals: normal  ST/T Wave abnormalities: nonspecific ST changes  PR and QRS Conduction Disutrbances:right bundle branch block  Narrative Interpretation:   Old EKG Reviewed: changes noted-rate faster; this appears to be sinus with prolonged PR however baseline changes in rapid rate, obscured the PR interval.   Radiology DG Chest Port 1 View  Result Date: 06/17/2019 CLINICAL DATA:  Short of breath EXAM: PORTABLE CHEST 1 VIEW COMPARISON:  10/04/2017 FINDINGS: Single frontal view of the  chest demonstrates dual lead pacemaker unchanged. Cardiac silhouette is stable. There is progressive scarring and fibrosis throughout the lungs, without acute airspace disease, effusion, or pneumothorax. No acute bony abnormalities. IMPRESSION: 1. Progressive scarring and fibrosis. No superimposed airspace disease. Electronically Signed   By: Randa Ngo M.D.   On: 06/16/2019 19:18   VAS [place] LOWER EXTREMITY VENOUS (DVT) (ONLY [facility] & [facility])  Result Date: 06/08/2019  Lower Venous DVTStudy Indications: Swelling, and Edema.  Comparison Study: no prior Performing Technologist: Abram Sander RVS  Examination Guidelines: A complete evaluation includes B-mode imaging, spectral Doppler, color Doppler, and power Doppler as needed of all accessible portions of each vessel. Bilateral testing is considered an integral part of a complete examination. Limited  examinations for reoccurring indications may be performed as noted. The reflux portion of the exam is performed with the patient in reverse Trendelenburg.  +---------+---------------+---------+-----------+----------+--------------+ RIGHT    CompressibilityPhasicitySpontaneityPropertiesThrombus Aging +---------+---------------+---------+-----------+----------+--------------+ CFV      Full           Yes      Yes                                 +---------+---------------+---------+-----------+----------+--------------+ SFJ      Full                                                        +---------+---------------+---------+-----------+----------+--------------+ FV Prox  Full                                                        +---------+---------------+---------+-----------+----------+--------------+ FV Mid   Full                                                        +---------+---------------+---------+-----------+----------+--------------+ FV DistalFull                                                        +---------+---------------+---------+-----------+----------+--------------+ PFV      Full                                                        +---------+---------------+---------+-----------+----------+--------------+ POP      Full           Yes      Yes                  limited vis    +---------+---------------+---------+-----------+----------+--------------+ PTV      Full                                                        +---------+---------------+---------+-----------+----------+--------------+  PERO                                                  Not visualized +---------+---------------+---------+-----------+----------+--------------+     Summary: RIGHT: - There is no evidence of deep vein thrombosis in the lower extremity.  - No cystic structure found in the popliteal fossa.   *See table(s) above for measurements and  observations.    Preliminary     Procedures .Critical Care Performed by: Daleen Bo, MD Authorized by: Daleen Bo, MD   Critical care provider statement:    Critical care time (minutes):  35   Critical care start time:  06/13/2019 6:35 PM   Critical care end time:  06/14/2019 10:55 PM   Critical care time was exclusive of:  Separately billable procedures and treating other patients   Critical care was necessary to treat or prevent imminent or life-threatening deterioration of the following conditions:  Sepsis   Critical care was time spent personally by me on the following activities:  Blood draw for specimens, development of treatment plan with patient or surrogate, discussions with consultants, evaluation of patient's response to treatment, examination of patient, obtaining history from patient or surrogate, ordering and performing treatments and interventions, ordering and review of laboratory studies, pulse oximetry, re-evaluation of patient's condition, review of old charts and ordering and review of radiographic studies   (including critical care time)  Medications Ordered in ED Medications  cefTRIAXone (ROCEPHIN) 2 g in sodium chloride 0.9 % 100 mL IVPB (0 g Intravenous Stopped 06/22/2019 2051)  vancomycin (VANCOCIN) IVPB 1000 mg/200 mL premix (has no administration in time range)  acetaminophen (TYLENOL) suppository 650 mg (650 mg Rectal Given 06/08/2019 2040)  lactated ringers bolus 1,000 mL (0 mLs Intravenous Stopped 06/14/2019 2055)    And  lactated ringers bolus 1,000 mL (1,000 mLs Intravenous New Bag/Given 06/17/2019 2045)    And  lactated ringers bolus 250 mL (0 mLs Intravenous Stopped 07/01/2019 2153)  vancomycin (VANCOREADY) IVPB 1500 mg/300 mL (1,500 mg Intravenous New Bag/Given 06/20/2019 2054)    ED Course  I have reviewed the triage vital signs and the nursing notes.  Pertinent labs & imaging results that were available during my care of the patient were reviewed by me and  considered in my medical decision making (see chart for details).  Clinical Course as of Jun 14 2299  Mon Jun 15, 2019  1947 Chest x-ray does not indicate clear focus for infection.  Consider cellulitis, right leg, as source.  Lactate mildly elevated.  Persistent hypotension, will order IV fluids and monitor closely   [EW]  1948 Per radiologist, progressive fibrosis and scarring, without evidence for airspace disease.  DG Chest Port 1 View [EW]  2022 His wife is here down to get some additional information, including presence of shortness of breath, for at least 2 or 3 weeks, which is worse today.  Breathing trouble got worse gradually.  He has also been urinating a lot for several weeks.  He has history of urinary tract infections.  He does not have known chronic pulmonary disease.  He does not have a pulmonologist.  He has had right lower leg swelling, for about 3 to 4 weeks without known trauma.  She has been using some moist heat on it without improvement.   [EW]  2022 Normal except white count high, platelets  high, neutrophils high  CBC with Differential(!) [EW]  2023 Abnormal, mild elevation  Lactic acid, plasma(!!) [EW]  2023 Abnormal, high  Protime-INR(!) [EW]  2023 Normal except sodium low, CO2 low, glucose high, BUN high, albumin low, AST low, GFR low  Comprehensive metabolic panel(!) [EW]  [id number] Abnormal, presence of protein, leukocytes, white cells; urine culture pending  Urinalysis, Routine w reflex microscopic(!) [EW]  2247 Normal  Respiratory Panel by RT PCR (Flu A&B, Covid) - Nasopharyngeal Swab [EW]    Clinical Course User Index [EW] Daleen Bo, MD   MDM Rules/Calculators/A&P                       Patient Vitals for the past 24 hrs:  BP Temp Temp src Pulse Resp SpO2 Height Weight  06/11/2019 2230 98/67 -- -- 73 (!) 23 100 % -- --  06/25/2019 2215 111/66 -- -- 78 20 100 % -- --  06/21/2019 2200 115/71 -- -- 77 (!) 21 100 % -- --  06/08/2019 2145 110/69 -- -- 79 (!) 22  100 % -- --  07/02/2019 2115 117/84 -- -- 86 17 95 % -- --  06/20/2019 2100 102/63 -- -- -- (!) 27 -- -- --  06/24/2019 2042 -- (!) 100.9 F (38.3 C) Rectal -- -- -- -- --  06/07/2019 2030 (!) 106/47 -- -- 86 20 100 % -- --  06/05/2019 2015 101/70 -- -- 86 20 100 % -- --  06/22/2019 2000 (!) 82/67 -- -- 92 20 99 % -- --  06/04/2019 1945 (!) 87/64 -- -- 94 (!) 21 99 % -- --  06/14/2019 1930 (!) 79/52 -- -- 98 (!) 29 100 % -- --  06/16/2019 1917 -- -- -- -- -- -- 5\' 7"  (1.702 m) 70.3 kg  06/21/2019 1915 (!) 71/55 -- -- (!) 109 (!) 29 99 % -- --  06/29/2019 1914 94/62 (!) 102.2 F (39 C) Rectal (!) 108 (!) 30 99 % -- --    10:48 PM Reevaluation with update and discussion. After initial assessment and treatment, an updated evaluation reveals he remains alert and comfortable, communicative.  No respiratory distress.  Respiratory rate improved. Daleen Bo   10:58 PM-blood pressure 116/76, oxygenation 98% on 5 L nasal cannula.  Medical Decision Making:  This patient is presenting for evaluation of shortness of breath, which does require a range of treatment options, and is a complaint that involves a moderate risk of morbidity and mortality. The differential diagnoses include CHF, pneumonia, urinary tract infection, PE. I decided  to review old records, and in summary elderly male with history of COPD, paroxysmal atrial fibrillation diabetes, coronary disease, status post CVA, history of frequent UTI.I got additional historical information from his wife with whom he lives. Clinical Laboratory Tests Ordered, included CBC, chemistries, lactate.  Evaluation consistent with acute infection, likely bacterial.  Mild lactate elevation improved somewhat with IV fluid supplementation. Radiologic Tests Ordered, included chest x-ray. I independently Visualized: Radiologic images, which show pulmonary fibrosis without significant CHF or infiltrate; Cardiac Monitor Tracing which shows regular tachycardia, possible a flutter  versus sinus tach with prolonged PR.    Critical Interventions-oxygen supplementation, IV fluids high volume bolus.  Repeat evaluation.  Lactate improved, patient weaned to 5 L nasal cannula  After These Interventions, the Patient was reevaluated and was found improved, with better oxygenation, improved blood pressure and improved tachypnea.  Oxygen weaned to 5 L by nasal cannula.  I suspect that he has underlying  pulmonary fibrosis, with sepsis, UTI versus cellulitis of the right lower leg, as etiology worsening respiratory status.  Doppler imaging right lower leg negative for DVT.  Patient will current hospitalization for stabilization.  He will likely benefit from pulmonary consultation,   CRITICAL CARE-yes Performed by: Daleen Bo   Nursing Notes Reviewed/ Care Coordinated Applicable Imaging Reviewed Interpretation of Laboratory Data incorporated into ED treatment   11:01 PM-Consult complete with hospitalist. Patient case explained and discussed.  He agrees to admit patient for further evaluation and treatment. Call ended at 11:10 PM  Plan: Admit   Final Clinical Impression(s) / ED Diagnoses Final diagnoses:  Sepsis, due to unspecified organism, unspecified whether acute organ dysfunction present ([place])  Hypoxia  Urinary tract infection without hematuria, site unspecified  Pulmonary fibrosis ([place])    Rx / DC Orders ED Discharge Orders    None       Daleen Bo, MD 06/07/2019 2312

## 2019-06-16 ENCOUNTER — Other Ambulatory Visit: Payer: Self-pay

## 2019-06-16 DIAGNOSIS — K219 Gastro-esophageal reflux disease without esophagitis: Secondary | ICD-10-CM

## 2019-06-16 DIAGNOSIS — I69393 Ataxia following cerebral infarction: Secondary | ICD-10-CM

## 2019-06-16 DIAGNOSIS — R569 Unspecified convulsions: Secondary | ICD-10-CM

## 2019-06-16 DIAGNOSIS — E118 Type 2 diabetes mellitus with unspecified complications: Secondary | ICD-10-CM

## 2019-06-16 DIAGNOSIS — N400 Enlarged prostate without lower urinary tract symptoms: Secondary | ICD-10-CM

## 2019-06-16 LAB — CBC
HCT: 37.8 % — ABNORMAL LOW (ref 39.0–52.0)
Hemoglobin: 12.4 g/dL — ABNORMAL LOW (ref 13.0–17.0)
MCH: 30.8 pg (ref 26.0–34.0)
MCHC: 32.8 g/dL (ref 30.0–36.0)
MCV: 94 fL (ref 80.0–100.0)
Platelets: 321 10*3/uL (ref 150–400)
RBC: 4.02 MIL/uL — ABNORMAL LOW (ref 4.22–5.81)
RDW: 14.8 % (ref 11.5–15.5)
WBC: 12.4 10*3/uL — ABNORMAL HIGH (ref 4.0–10.5)
nRBC: 0 % (ref 0.0–0.2)

## 2019-06-16 LAB — COMPREHENSIVE METABOLIC PANEL
ALT: 13 U/L (ref 0–44)
AST: 44 U/L — ABNORMAL HIGH (ref 15–41)
Albumin: 2.4 g/dL — ABNORMAL LOW (ref 3.5–5.0)
Alkaline Phosphatase: 58 U/L (ref 38–126)
Anion gap: 6 (ref 5–15)
BUN: 18 mg/dL (ref 8–23)
CO2: 24 mmol/L (ref 22–32)
Calcium: 8.1 mg/dL — ABNORMAL LOW (ref 8.9–10.3)
Chloride: 108 mmol/L (ref 98–111)
Creatinine, Ser: 1.15 mg/dL (ref 0.61–1.24)
GFR calc Af Amer: >60 mL/min (ref >60)
GFR calc non Af Amer: 56 mL/min — ABNORMAL LOW (ref >60)
Glucose, Bld: 96 mg/dL (ref 70–99)
Potassium: 4.3 mmol/L (ref 3.5–5.1)
Sodium: 138 mmol/L (ref 135–145)
Total Bilirubin: 1.1 mg/dL (ref 0.3–1.2)
Total Protein: 4.9 g/dL — ABNORMAL LOW (ref 6.5–8.1)

## 2019-06-16 LAB — PROTIME-INR
INR: 1.7 — ABNORMAL HIGH (ref 0.8–1.2)
Prothrombin Time: 20.3 seconds — ABNORMAL HIGH (ref 11.4–15.2)

## 2019-06-16 LAB — MRSA PCR SCREENING: MRSA by PCR: NEGATIVE

## 2019-06-16 LAB — CORTISOL-AM, BLOOD: Cortisol - AM: 8.5 ug/dL (ref 6.7–22.6)

## 2019-06-16 LAB — PROCALCITONIN: Procalcitonin: 0.19 ng/mL

## 2019-06-16 LAB — LACTIC ACID, PLASMA: Lactic Acid, Venous: 0.9 mmol/L (ref 0.5–1.9)

## 2019-06-16 MED ORDER — SENNOSIDES-DOCUSATE SODIUM 8.6-50 MG PO TABS
2.0000 | ORAL_TABLET | Freq: Every evening | ORAL | Status: DC | PRN
  Administered 2019-06-18: 2 via ORAL
  Filled 2019-06-16: qty 2

## 2019-06-16 MED ORDER — LACTATED RINGERS IV BOLUS
1000.0000 mL | Freq: Once | INTRAVENOUS | Status: AC
  Administered 2019-06-16: 1000 mL via INTRAVENOUS

## 2019-06-16 MED ORDER — POLYETHYLENE GLYCOL 3350 17 G PO PACK
17.0000 g | PACK | Freq: Every day | ORAL | Status: DC | PRN
  Administered 2019-06-18: 17 g via ORAL
  Filled 2019-06-16: qty 1

## 2019-06-16 NOTE — ED Notes (Signed)
Lunch Tray Ordered @ 1026.

## 2019-06-16 NOTE — Progress Notes (Signed)
PROGRESS NOTE    Herbert Marquez  [id number] DOB: 05/20/1927 DOA: 07/01/2019 PCP: [name], DO   Brief Narrative:  86 year old with history of multiple CVAs with residual effect, HTN, DM2, CAD, paroxysmal A. fib, BPH, ataxia, COPD, CKD stage III AAA presented to the hospital with complaints of weakness and fever.  Patient was diagnosed with septic shock secondary to possible urinary tract infection or right lower extremity cellulitis.  Started on IV Rocephin and vancomycin.   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  Monitor aggressive parameters -No bacteria seen on UA. -Follow-up culture data -IV vancomycin and Rocephin -Reduce IV fluid rate to 75 cc/h -Supportive care  CKD stage IIIa -Creatinine around baseline.  History of CVA -On aspirin, statin, Eliquis  GERD -PPI  Paroxysmal atrial fibrillation -Paced rhythm.  Continue Eliquis  History of COPD -As needed bronchodilators  History of seizures -Keppra 750 mg twice daily  Prolonged QTC -Continue Seroquel.  QTC is improved, 503.   DVT prophylaxis: Eliquis Code Status: DNR Family Communication: Wife at bedside Disposition Plan:   Patient From= home  Patient Anticipated D/C place= to be  determined  Barriers= maintain hospital stay as patient is septic secondary to underlying infection requiring IV antibiotics.  Unsafe for discharge at this time.  Subjective: Seen and examined at bedside, does not have any complaints at this time.  Wife is present at bedside as well.  Tells me he has been suffering from right lower extremity erythema for the past several weeks for which she was supposed see dermatologist but ended up here now.  Review of Systems Otherwise negative except as per HPI, including: General: Denies fever, chills, night sweats or unintended weight loss. Resp: Denies cough, wheezing, shortness of breath. Cardiac: Denies chest pain, palpitations, orthopnea, paroxysmal nocturnal dyspnea. GI: Denies abdominal pain, nausea, vomiting, diarrhea or constipation GU: Denies dysuria, frequency, hesitancy or incontinence MS: Denies muscle aches, joint pain or swelling Neuro: Denies headache, neurologic deficits (focal weakness, numbness, tingling), abnormal gait Psych: Denies anxiety, depression, SI/HI/AVH Skin: Denies new rashes or lesions ID: Denies sick contacts, exotic exposures, travel  Examination:  General exam: Appears calm and comfortable  Respiratory system: Clear to auscultation. Respiratory effort normal. Cardiovascular system: S1 & S2 heard, RRR. No JVD, murmurs, rubs, gallops or clicks. No pedal edema. Gastrointestinal system: Abdomen is nondistended, soft and nontender. No organomegaly or masses felt. Normal bowel sounds heard. Central nervous system: Alert and oriented. No focal neurological deficits. Extremities: Symmetric 5 x 5 power. Skin: Right lower extremity erythema and warmth Psychiatry: Judgement and insight appear normal. Mood & affect appropriate.     Objective: Vitals:   06/16/19 1145 06/16/19 1215 06/16/19 1303 06/16/19 1351  BP: 111/64 (!) 115/51 120/78 110/64  Pulse: 61 66 72 83  Resp: (!) 27 (!)  24  (!) 28  Temp:      TempSrc:        SpO2: 93% 93% (!) 88% 96%  Weight:      Height:        Intake/Output Summary (Last 24 hours) at 06/16/2019 1417 Last data filed at 06/16/2019 0600 Gross per 24 hour  Intake 1000 ml  Output 1200 ml  Net -200 ml   Filed Weights   06/25/2019 1917  Weight: 70.3 kg     Data Reviewed:   CBC: Recent Labs  Lab 06/14/2019 1852 06/16/19 0506  WBC 17.6* 12.4*  NEUTROABS 14.9*  --   HGB 15.3 12.4*  HCT 45.9 37.8*  MCV 92.2 94.0  PLT 404* [id number]   Basic Metabolic Panel: Recent Labs  Lab 06/27/2019 1852 06/16/19 0506  NA 132* 138  K 4.3 4.3  CL 100 108  CO2 21* 24  GLUCOSE 128* 96  BUN 26* 18  CREATININE 1.16 1.15  CALCIUM 8.9 8.1*   GFR: Estimated Creatinine Clearance: 40.7 mL/min (by C-G formula based on SCr of 1.15 mg/dL). Liver Function Tests: Recent Labs  Lab 06/06/2019 1852 06/16/19 0506  AST 53* 44*  ALT 19 13  ALKPHOS 73 58  BILITOT 0.6 1.1  PROT 6.6 4.9*  ALBUMIN 3.4* 2.4*   No results for input(s): LIPASE, AMYLASE in the last 168 hours. No results for input(s): AMMONIA in the last 168 hours. Coagulation Profile: Recent Labs  Lab 07/03/2019 1900 06/16/19 0506  INR 1.5* 1.7*   Cardiac Enzymes: No results for input(s): CKTOTAL, CKMB, CKMBINDEX, TROPONINI in the last 168 hours. BNP (last 3 results) No results for input(s): PROBNP in the last 8760 hours. HbA1C: No results for input(s): HGBA1C in the last 72 hours. CBG: No results for input(s): GLUCAP in the last 168 hours. Lipid Profile: No results for input(s): CHOL, HDL, LDLCALC, TRIG, CHOLHDL, LDLDIRECT in the last 72 hours. Thyroid Function Tests: No results for input(s): TSH, T4TOTAL, FREET4, T3FREE, THYROIDAB in the last 72 hours. Anemia Panel: No results for input(s): VITAMINB12, FOLATE, FERRITIN, TIBC, IRON, RETICCTPCT in the last 72 hours. Sepsis Labs: Recent Labs  Lab 07/02/2019 1852 06/09/2019 2134 06/16/19 0506  PROCALCITON  --   --  0.19  LATICACIDVEN 2.1* 2.0* 0.9    Recent Results  (from the past 240 hour(s))  Culture, blood (routine x 2)     Status: None (Preliminary result)   Collection Time: 06/21/2019  6:45 PM   Specimen: BLOOD  Result Value Ref Range Status   Specimen Description BLOOD BLOOD RIGHT WRIST  Final   Special Requests   Final    BOTTLES DRAWN AEROBIC AND ANAEROBIC Blood Culture adequate volume   Culture   Final    NO GROWTH < 12 HOURS Performed at [facility], [place] [address] [place], [place] [address]    Report Status PENDING  Incomplete  Culture, blood (routine x 2)     Status: None (Preliminary result)   Collection Time: 06/21/2019  6:45 PM   Specimen: BLOOD  Result Value Ref Range Status   Specimen Description BLOOD RIGHT UPPER ARM  Final   Special Requests   Final    BOTTLES DRAWN AEROBIC ONLY Blood Culture results may not be optimal due to an inadequate volume of blood received in culture bottles   Culture   Final    NO GROWTH < 12 HOURS Performed at [facility], [place] [address] [place], [place] [address]  Report Status PENDING  Incomplete  Respiratory Panel by RT PCR (Flu A&B, Covid) - Nasopharyngeal Swab     Status: None   Collection Time: 06/19/2019  7:34 PM   Specimen: Nasopharyngeal Swab  Result Value Ref Range Status   SARS Coronavirus 2 by RT PCR NEGATIVE NEGATIVE Final    Comment: (NOTE) SARS-CoV-2 target nucleic acids are NOT DETECTED. The SARS-CoV-2 RNA is generally detectable in upper respiratoy specimens during the acute phase of infection. The lowest concentration of SARS-CoV-2 viral copies this assay can detect is 131 copies/mL. A negative result does not preclude SARS-Cov-2 infection and should not be used as the sole basis for treatment or other patient management decisions. A negative result may occur with  improper specimen collection/handling, submission of specimen other than nasopharyngeal swab, presence of viral mutation(s) within the areas targeted by this assay, and inadequate number of viral  copies (<131 copies/mL). A negative result must be combined with clinical observations, patient history, and epidemiological information. The expected result is Negative. Fact Sheet for Patients:  PinkCheek.be Fact Sheet for Healthcare Providers:  GravelBags.it This test is not yet ap proved or cleared by the [place] FDA and  has been authorized for detection and/or diagnosis of SARS-CoV-2 by FDA under an Emergency Use Authorization (EUA). This EUA will remain  in effect (meaning this test can be used) for the duration of the COVID-19 declaration under Section 564(b)(1) of the Act, 21 U.S.C. section 360bbb-3(b)(1), unless the authorization is terminated or revoked sooner.    Influenza A by PCR NEGATIVE NEGATIVE Final   Influenza B by PCR NEGATIVE NEGATIVE Final    Comment: (NOTE) The Xpert Xpress SARS-CoV-2/FLU/RSV assay is intended as an aid in  the diagnosis of influenza from Nasopharyngeal swab specimens and  should not be used as a sole basis for treatment. Nasal washings and  aspirates are unacceptable for Xpert Xpress SARS-CoV-2/FLU/RSV  testing. Fact Sheet for Patients: PinkCheek.be Fact Sheet for Healthcare Providers: GravelBags.it This test is not yet approved or cleared by the [place] FDA and  has been authorized for detection and/or diagnosis of SARS-CoV-2 by  FDA under an Emergency Use Authorization (EUA). This EUA will remain  in effect (meaning this test can be used) for the duration of the  Covid-19 declaration under Section 564(b)(1) of the Act, 21  U.S.C. section 360bbb-3(b)(1), unless the authorization is  terminated or revoked. Performed at [facility], [place] [address] [place], [place] [address]   MRSA PCR Screening     Status: None   Collection Time: 06/16/19 12:04 AM   Specimen: Nasal Mucosa; Nasopharyngeal  Result Value Ref  Range Status   MRSA by PCR NEGATIVE NEGATIVE Final    Comment:        The GeneXpert MRSA Assay (FDA approved for NASAL specimens only), is one component of a comprehensive MRSA colonization surveillance program. It is not intended to diagnose MRSA infection nor to guide or monitor treatment for MRSA infections. Performed at [facility], [place] [address] [place], [place] [address]          Radiology Studies: DG Chest 1 View  Result Date: 06/16/2019 CLINICAL DATA:  Cough.  Shortness of breath. EXAM: CHEST  1 VIEW COMPARISON:  10/04/2017.  CT 06/19/2015. FINDINGS: Cardiac pacer stable position. Cardiomegaly. No pulmonary venous congestion. Increase interstitial prominence noted bilaterally. Findings could represent interstitial edema/pneumonitis superimposed on known chronic interstitial lung disease. No pleural effusion or pneumothorax. No acute bony abnormality. IMPRESSION:  1. Cardiac pacer in stable position. Cardiomegaly. No pulmonary venous congestion. 2. Increase interstitial prominence noted bilaterally. Findings could represent interstitial edema/pneumonitis superimposed on known chronic interstitial lung disease. Electronically Signed   By: Marcello Moores  Register   On: 06/16/2019 06:56   DG Chest Port 1 View  Result Date: 06/27/2019 CLINICAL DATA:  Short of breath EXAM: PORTABLE CHEST 1 VIEW COMPARISON:  10/04/2017 FINDINGS: Single frontal view of the chest demonstrates dual lead pacemaker unchanged. Cardiac silhouette is stable. There is progressive scarring and fibrosis throughout the lungs, without acute airspace disease, effusion, or pneumothorax. No acute bony abnormalities. IMPRESSION: 1. Progressive scarring and fibrosis. No superimposed airspace disease. Electronically Signed   By: Randa Ngo M.D.   On: 06/22/2019 19:18   VAS [place] LOWER EXTREMITY VENOUS (DVT) (ONLY [facility] & [facility])  Result Date: 06/07/2019  Lower Venous DVTStudy Indications: Swelling, and Edema.  Comparison  Study: no prior Performing Technologist: Abram Sander RVS  Examination Guidelines: A complete evaluation includes B-mode imaging, spectral Doppler, color Doppler, and power Doppler as needed of all accessible portions of each vessel. Bilateral testing is considered an integral part of a complete examination. Limited examinations for reoccurring indications may be performed as noted. The reflux portion of the exam is performed with the patient in reverse Trendelenburg.  +---------+---------------+---------+-----------+----------+--------------+ RIGHT    CompressibilityPhasicitySpontaneityPropertiesThrombus Aging +---------+---------------+---------+-----------+----------+--------------+ CFV      Full           Yes      Yes                                 +---------+---------------+---------+-----------+----------+--------------+ SFJ      Full                                                        +---------+---------------+---------+-----------+----------+--------------+ FV Prox  Full                                                        +---------+---------------+---------+-----------+----------+--------------+ FV Mid   Full                                                        +---------+---------------+---------+-----------+----------+--------------+ FV DistalFull                                                        +---------+---------------+---------+-----------+----------+--------------+ PFV      Full                                                        +---------+---------------+---------+-----------+----------+--------------+ POP      Full  Yes      Yes                  limited vis    +---------+---------------+---------+-----------+----------+--------------+ PTV      Full                                                        +---------+---------------+---------+-----------+----------+--------------+ PERO                                                   Not visualized +---------+---------------+---------+-----------+----------+--------------+     Summary: RIGHT: - There is no evidence of deep vein thrombosis in the lower extremity.  - No cystic structure found in the popliteal fossa.   *See table(s) above for measurements and observations.    Preliminary         Scheduled Meds: . apixaban  5 mg Oral BID  . aspirin EC  81 mg Oral QHS  . atorvastatin  80 mg Oral QHS  . feeding supplement (ENSURE ENLIVE)  237 mL Oral Q lunch  . levETIRAcetam  750 mg Oral BID  . multivitamin with minerals  1 tablet Oral Q breakfast  . QUEtiapine  25 mg Oral QHS   Continuous Infusions: . sodium chloride 75 mL/hr at 06/16/19 1339  . cefTRIAXone (ROCEPHIN)  IV Stopped (06/27/2019 2051)  . vancomycin       LOS: 1 day   Time spent= 40 mins    Betzy Barbier Arsenio Loader, MD [facility]  If 7PM-7AM, please contact night-coverage  06/16/2019, 2:17 PM

## 2019-06-16 NOTE — ED Notes (Signed)
Breakfast ordered 

## 2019-06-16 NOTE — Progress Notes (Signed)
PROGRESS NOTE    Herbert Marquez  [id number] DOB: 09-11-1927 DOA: 06/09/2019 PCP: [name], DO   Brief Narrative:  [name] is a 86 year old gentleman with history of COPD, CAD, paroxysmal atrial fibrillation on Eliquis, history of CVAs, vascular dementia, CKD IIIa, type 2 diabetes mellitus, and seizures, now presenting to the emergency department with respiratory distress and malaise.  Patient is accompanied by his wife who assists with the history.  Patient has not been coughing but has appeared fatigued and appears to be short of breath.  This has been going on for several days.  He also developed a recent pruritic rash involving the lower right leg.  In the ED, patient was found to be febrile, hypotensive, tachycardic, tachypneic, with leukocytosis to 17,600, and elevated lactic acid.  He was admitted to the hospital with working diagnosis of sepsis secondary to cellulitis, cultured, given 30 cc/kg fluid bolus, and started on Rocephin and vancomycin.  Blood pressure had improved initially with systolic reaching the low 100s after fluid resuscitation, but has since drifted back down.  Automatic and manual pressures have been taken and systolic confirmed to be back in the low 70s.  Patient is no longer tachypneic, denies chest pain or lightheadedness, but feels increasingly sleepy.  He has a Foley catheter in place and has put out at least 500 cc.  He had echocardiogram 2 years ago with preserved EF and grade 1 diastolic dysfunction.   Assessment & Plan:  1. Sepsis with septic shock  - SBP in low 70s after initially improving with 30 cc/kg LR  - He denies chest pain or lightheadedness, is at baseline cognitively per wife at bedside, but has cool extremities  - He is not in any respiratory distress laying flat, has good UOP, had preserved EF in 2019 and can receive more IVF  - Plan to give another 1 liter LR bolus now, continue current antibiotics, repeat lactate, could likely  tolerate additional fluids if needed though would also consider hydrocortisone 100 mg IV at that point as well     DVT prophylaxis: Eliquis  Code Status: DNR  Family Communication: Wife was updated at bedside; we discussed that patient is critically-ill    Subjective: Sleepy, cold. No chest pain, no headache.   Objective: Vitals:   06/16/19 0215 06/16/19 0222 06/16/19 0229 06/16/19 0245  BP: (!) 88/51  (!) 78/60 (!) 91/56  Pulse: 61 73  66  Resp: (!) 21 17  13   Temp:      TempSrc:      SpO2: 93% 97%  100%  Weight:      Height:       No intake or output data in the 24 hours ending 06/16/19 0257 Filed Weights   06/21/2019 1917  Weight: 70.3 kg    Examination:  General exam: sleeping, wakes to voice  Respiratory system: Clear to auscultation. Respiratory effort normal. Cardiovascular system: S1 & S2 heard, RRR. No JVD, murmurs, rubs, gallops or clicks. No pedal edema. Gastrointestinal system: Abdomen is nondistended, soft and nontender. No organomegaly or masses felt. Normal bowel sounds heard. Central nervous system: Sleeping, wakes to voice, oriented to person and place. Moving all extremities  Extremities: Cool extremities. No acrocyanosis. No gross deformity.  Skin: Erythematous plaques with excoriations involving lower right leg. Cool.    Data Reviewed: I have personally reviewed following labs and imaging studies  CBC: Recent Labs  Lab 07/03/2019 1852  WBC 17.6*  NEUTROABS 14.9*  HGB 15.3  HCT  45.9  MCV 92.2  PLT [id number]*   Basic Metabolic Panel: Recent Labs  Lab 07/03/2019 1852  NA 132*  K 4.3  CL 100  CO2 21*  GLUCOSE 128*  BUN 26*  CREATININE 1.16  CALCIUM 8.9   GFR: Estimated Creatinine Clearance: 40.4 mL/min (by C-G formula based on SCr of 1.16 mg/dL). Liver Function Tests: Recent Labs  Lab 07/01/2019 1852  AST 53*  ALT 19  ALKPHOS 73  BILITOT 0.6  PROT 6.6  ALBUMIN 3.4*   No results for input(s): LIPASE, AMYLASE in the last 168 hours. No  results for input(s): AMMONIA in the last 168 hours. Coagulation Profile: Recent Labs  Lab 06/19/2019 1900  INR 1.5*   Cardiac Enzymes: No results for input(s): CKTOTAL, CKMB, CKMBINDEX, TROPONINI in the last 168 hours. BNP (last 3 results) No results for input(s): PROBNP in the last 8760 hours. HbA1C: No results for input(s): HGBA1C in the last 72 hours. CBG: No results for input(s): GLUCAP in the last 168 hours. Lipid Profile: No results for input(s): CHOL, HDL, LDLCALC, TRIG, CHOLHDL, LDLDIRECT in the last 72 hours. Thyroid Function Tests: No results for input(s): TSH, T4TOTAL, FREET4, T3FREE, THYROIDAB in the last 72 hours. Anemia Panel: No results for input(s): VITAMINB12, FOLATE, FERRITIN, TIBC, IRON, RETICCTPCT in the last 72 hours. Sepsis Labs: Recent Labs  Lab 06/12/2019 1852 06/08/2019 2134  LATICACIDVEN 2.1* 2.0*    Recent Results (from the past 240 hour(s))  Respiratory Panel by RT PCR (Flu A&B, Covid) - Nasopharyngeal Swab     Status: None   Collection Time: 07/03/2019  7:34 PM   Specimen: Nasopharyngeal Swab  Result Value Ref Range Status   SARS Coronavirus 2 by RT PCR NEGATIVE NEGATIVE Final    Comment: (NOTE) SARS-CoV-2 target nucleic acids are NOT DETECTED. The SARS-CoV-2 RNA is generally detectable in upper respiratoy specimens during the acute phase of infection. The lowest concentration of SARS-CoV-2 viral copies this assay can detect is 131 copies/mL. A negative result does not preclude SARS-Cov-2 infection and should not be used as the sole basis for treatment or other patient management decisions. A negative result may occur with  improper specimen collection/handling, submission of specimen other than nasopharyngeal swab, presence of viral mutation(s) within the areas targeted by this assay, and inadequate number of viral copies (<131 copies/mL). A negative result must be combined with clinical observations, patient history, and epidemiological  information. The expected result is Negative. Fact Sheet for Patients:  PinkCheek.be Fact Sheet for Healthcare Providers:  GravelBags.it This test is not yet ap proved or cleared by the [place] FDA and  has been authorized for detection and/or diagnosis of SARS-CoV-2 by FDA under an Emergency Use Authorization (EUA). This EUA will remain  in effect (meaning this test can be used) for the duration of the COVID-19 declaration under Section 564(b)(1) of the Act, 21 U.S.C. section 360bbb-3(b)(1), unless the authorization is terminated or revoked sooner.    Influenza A by PCR NEGATIVE NEGATIVE Final   Influenza B by PCR NEGATIVE NEGATIVE Final    Comment: (NOTE) The Xpert Xpress SARS-CoV-2/FLU/RSV assay is intended as an aid in  the diagnosis of influenza from Nasopharyngeal swab specimens and  should not be used as a sole basis for treatment. Nasal washings and  aspirates are unacceptable for Xpert Xpress SARS-CoV-2/FLU/RSV  testing. Fact Sheet for Patients: PinkCheek.be Fact Sheet for Healthcare Providers: GravelBags.it This test is not yet approved or cleared by the [place] FDA and  has been  authorized for detection and/or diagnosis of SARS-CoV-2 by  FDA under an Emergency Use Authorization (EUA). This EUA will remain  in effect (meaning this test can be used) for the duration of the  Covid-19 declaration under Section 564(b)(1) of the Act, 21  U.S.C. section 360bbb-3(b)(1), unless the authorization is  terminated or revoked. Performed at [facility], [place] [address] [place], [place] [address]   MRSA PCR Screening     Status: None   Collection Time: 06/16/19 12:04 AM   Specimen: Nasal Mucosa; Nasopharyngeal  Result Value Ref Range Status   MRSA by PCR NEGATIVE NEGATIVE Final    Comment:        The GeneXpert MRSA Assay (FDA approved for NASAL  specimens only), is one component of a comprehensive MRSA colonization surveillance program. It is not intended to diagnose MRSA infection nor to guide or monitor treatment for MRSA infections. Performed at [facility], [place] [address], [place], [place] [address]          Radiology Studies: DG Chest Port 1 View  Result Date: 06/29/2019 CLINICAL DATA:  Short of breath EXAM: PORTABLE CHEST 1 VIEW COMPARISON:  10/04/2017 FINDINGS: Single frontal view of the chest demonstrates dual lead pacemaker unchanged. Cardiac silhouette is stable. There is progressive scarring and fibrosis throughout the lungs, without acute airspace disease, effusion, or pneumothorax. No acute bony abnormalities. IMPRESSION: 1. Progressive scarring and fibrosis. No superimposed airspace disease. Electronically Signed   By: Randa Ngo M.D.   On: 06/26/2019 19:18   VAS [place] LOWER EXTREMITY VENOUS (DVT) (ONLY [facility] & [facility])  Result Date: 06/17/2019  Lower Venous DVTStudy Indications: Swelling, and Edema.  Comparison Study: no prior Performing Technologist: Abram Sander RVS  Examination Guidelines: A complete evaluation includes B-mode imaging, spectral Doppler, color Doppler, and power Doppler as needed of all accessible portions of each vessel. Bilateral testing is considered an integral part of a complete examination. Limited examinations for reoccurring indications may be performed as noted. The reflux portion of the exam is performed with the patient in reverse Trendelenburg.  +---------+---------------+---------+-----------+----------+--------------+ RIGHT    CompressibilityPhasicitySpontaneityPropertiesThrombus Aging +---------+---------------+---------+-----------+----------+--------------+ CFV      Full           Yes      Yes                                 +---------+---------------+---------+-----------+----------+--------------+ SFJ      Full                                                         +---------+---------------+---------+-----------+----------+--------------+ FV Prox  Full                                                        +---------+---------------+---------+-----------+----------+--------------+ FV Mid   Full                                                        +---------+---------------+---------+-----------+----------+--------------+  FV DistalFull                                                        +---------+---------------+---------+-----------+----------+--------------+ PFV      Full                                                        +---------+---------------+---------+-----------+----------+--------------+ POP      Full           Yes      Yes                  limited vis    +---------+---------------+---------+-----------+----------+--------------+ PTV      Full                                                        +---------+---------------+---------+-----------+----------+--------------+ PERO                                                  Not visualized +---------+---------------+---------+-----------+----------+--------------+     Summary: RIGHT: - There is no evidence of deep vein thrombosis in the lower extremity.  - No cystic structure found in the popliteal fossa.   *See table(s) above for measurements and observations.    Preliminary         Scheduled Meds: . apixaban  5 mg Oral BID  . aspirin EC  81 mg Oral QHS  . atorvastatin  80 mg Oral QHS  . feeding supplement (ENSURE ENLIVE)  237 mL Oral Q lunch  . levETIRAcetam  750 mg Oral BID  . multivitamin with minerals  1 tablet Oral Q breakfast  . QUEtiapine  25 mg Oral QHS   Continuous Infusions: . sodium chloride 150 mL/hr at 06/16/19 0004  . cefTRIAXone (ROCEPHIN)  IV Stopped (06/05/2019 2051)  . cefTRIAXone (ROCEPHIN)  IV    . vancomycin       LOS: 1 day    Time spent: 42 minutes spent reviewing chart, interpreting data, discussing with  RN, discussing critical nature of this illness with wife at bedside, interviewing and examining patient.   Patient is critically-ill with septic shock and requires highly complex decision making to prevent further deterioration and death.     Vianne Bulls, MD [facility]   To contact the attending provider between 7A-7P or the covering provider during after hours 7P-7A, please log into the web site [email or web address] and access using universal [place] password for that web site. If you do not have the password, please call the hospital operator.  06/16/2019, 2:57 AM

## 2019-06-16 NOTE — ED Notes (Signed)
This RN paged the On-Call Provider regarding this pts decreasing BP

## 2019-06-17 ENCOUNTER — Inpatient Hospital Stay (HOSPITAL_COMMUNITY): Payer: Medicare Other

## 2019-06-17 LAB — CBC
HCT: 43 % (ref 39.0–52.0)
Hemoglobin: 13.6 g/dL (ref 13.0–17.0)
MCH: 30.3 pg (ref 26.0–34.0)
MCHC: 31.6 g/dL (ref 30.0–36.0)
MCV: 95.8 fL (ref 80.0–100.0)
Platelets: 345 10*3/uL (ref 150–400)
RBC: 4.49 MIL/uL (ref 4.22–5.81)
RDW: 14.7 % (ref 11.5–15.5)
WBC: 15 10*3/uL — ABNORMAL HIGH (ref 4.0–10.5)
nRBC: 0 % (ref 0.0–0.2)

## 2019-06-17 LAB — URINE CULTURE

## 2019-06-17 LAB — BASIC METABOLIC PANEL
Anion gap: 11 (ref 5–15)
BUN: 14 mg/dL (ref 8–23)
CO2: 18 mmol/L — ABNORMAL LOW (ref 22–32)
Calcium: 8 mg/dL — ABNORMAL LOW (ref 8.9–10.3)
Chloride: 104 mmol/L (ref 98–111)
Creatinine, Ser: 0.92 mg/dL (ref 0.61–1.24)
GFR calc Af Amer: >60 mL/min (ref >60)
GFR calc non Af Amer: >60 mL/min (ref >60)
Glucose, Bld: 91 mg/dL (ref 70–99)
Potassium: 3.8 mmol/L (ref 3.5–5.1)
Sodium: 133 mmol/L — ABNORMAL LOW (ref 135–145)

## 2019-06-17 LAB — MAGNESIUM: Magnesium: 3.8 mg/dL — ABNORMAL HIGH (ref 1.7–2.4)

## 2019-06-17 MED ORDER — FUROSEMIDE 10 MG/ML IJ SOLN
20.0000 mg | Freq: Once | INTRAMUSCULAR | Status: AC
  Administered 2019-06-17: 20 mg via INTRAVENOUS
  Filled 2019-06-17: qty 2

## 2019-06-17 MED ORDER — HALOPERIDOL LACTATE 5 MG/ML IJ SOLN
2.0000 mg | Freq: Four times a day (QID) | INTRAMUSCULAR | Status: AC | PRN
  Administered 2019-06-17 (×2): 2 mg via INTRAVENOUS
  Filled 2019-06-17 (×2): qty 1

## 2019-06-17 MED ORDER — CEPHALEXIN 250 MG PO CAPS: 500.0000 mg | ORAL_CAPSULE | Freq: Four times a day (QID) | ORAL | Status: DC

## 2019-06-17 MED ORDER — FUROSEMIDE 10 MG/ML IJ SOLN
40.0000 mg | Freq: Once | INTRAMUSCULAR | Status: AC
  Administered 2019-06-17: 40 mg via INTRAVENOUS
  Filled 2019-06-17: qty 4

## 2019-06-17 MED ORDER — HALOPERIDOL LACTATE 5 MG/ML IJ SOLN
1.0000 mg | Freq: Four times a day (QID) | INTRAMUSCULAR | Status: DC | PRN
  Administered 2019-06-17: 1 mg via INTRAVENOUS
  Filled 2019-06-17: qty 1

## 2019-06-17 NOTE — Progress Notes (Addendum)
Pt desat to mid-70's on 7LO2.  Increased O2 to 10L and now sat 98%. Educated to breathe in through nose. Pt sounds congested. Resp called to check in on pt. Continuing to monitor.   Pt continuing to desat d/t agitation. RT placed pt on HFNC. Currently on 7L. MD made aware. Monitoring.   Lasix 40mg  and Haldol given.   0630 - Pt agitated pulling at leads and nasal cannula. Refused nasal cannula, desat to 47s. After a few minutes, pt now asleep - O2 is 90% on room air. Monitoring.   0700 - Pt desat to 68-70%. Pt started to become combative. Rapid called, suggested a Nonrebreather. O2 sat now 95%. MD paged, instructed to obtain EKG.

## 2019-06-17 NOTE — Progress Notes (Signed)
PROGRESS NOTE    Herbert Marquez  [id number] DOB: 1927-08-28 DOA: 06/10/2019 PCP: No primary care provider on file.   Brief Narrative:  86 year old with history of multiple CVAs with residual effect, HTN, DM2, CAD, paroxysmal A. fib, BPH, ataxia, COPD, CKD stage III AAA presented to the hospital with complaints of weakness and fever.  Patient was diagnosed with septic shock secondary to possible urinary tract infection or right lower extremity cellulitis.  Started on IV Rocephin and vancomycin.   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  Monitor aggressive parameters -No bacteria seen on UA. -Follow-up culture data -IV vancomycin and Rocephin -Reduce IV fluid rate to 75 cc/h -Supportive care  Acute hypoxic respiratory failure secondary to fluid overload Pulmonary edema -Supplemental oxygen, wean down as appropriate -Discontinue IV fluids.  Received couple doses of IV Lasix earlier today, will schedule for 1 more 20 mg of IV Lasix later on today afternoon. -Monitor his blood pressure closely  Delirium, intermittent -No focal neuro deficits.  Patient is following all the commands.  I suspect he is extremely tired from not being able to rest well over the last 36  hours.  Daughter at bedside during my evaluation and he was following all the commands and answering questions appropriately.  CKD stage IIIa -Creatinine around baseline.  History of CVA -On aspirin, statin, Eliquis  GERD -PPI  Paroxysmal atrial fibrillation -Paced rhythm.  Continue Eliquis  History of COPD -As needed bronchodilators  History of seizures -Keppra 750 mg twice daily  Prolonged QTC -Continue Seroquel.  QTC is improved, 503.   DVT prophylaxis: Eliquis Code Status: DNR Family Communication: Daughter at bedside Disposition Plan:   Patient From= home  Patient Anticipated D/C place= to be determined  Barriers= maintain hospital stay as patient is septic secondary to underlying infection requiring IV antibiotics.  Unsafe for discharge at this time.  Subjective: Overnight patient had episode of significant delirium and was combative.  This morning and overnight became hypoxic with shortness of breath.  Chest x-ray showed concerns of pulmonary edema therefore received Lasix.  He also received Haldol.  This morning during my evaluation he was drowsy but easily arousable and answering most of the questions appropriately.  He was also able to eat some pured diet with his meds.  Daughter at bedside during my evaluation and all the questions were answered.   Review of Systems Otherwise negative except as per HPI, including: General: Denies fever, chills, night sweats or unintended weight loss. Resp: Denies cough, wheezing, shortness of breath. Cardiac: Denies chest pain, palpitations, orthopnea, paroxysmal nocturnal dyspnea. GI: Denies abdominal pain, nausea, vomiting, diarrhea or constipation GU: Denies dysuria, frequency, hesitancy or incontinence MS: Denies muscle aches, joint pain or swelling Neuro: Denies headache, neurologic deficits (focal weakness, numbness, tingling), abnormal gait Psych: Denies  anxiety, depression, SI/HI/AVH Skin: Denies new rashes or  lesions ID: Denies sick contacts, exotic exposures, travel  Examination:  Constitutional: Cachectic elderly frail, 7 L high flow nasal cannula which I weaned him down to 3 L high flow saturating greater than 98% Respiratory: Bibasilar rhonchi Cardiovascular: Normal sinus rhythm, no rubs Abdomen: Nontender nondistended good bowel sounds Musculoskeletal: No edema noted Skin: No rashes seen Neurologic: CN 2-12 grossly intact.  And nonfocal Psychiatric: Poor judgment and insight.  Alert awake oriented X2.  Follows all commands   Objective: Vitals:   06/17/19 0450 06/17/19 0900 06/17/19 0912 06/17/19 1041  BP: 126/71 98/77 (!) 120/93   Pulse: 84 78    Resp: 19 15    Temp: 99 F (37.2 C)     TempSrc: Oral     SpO2: 95% 100%  95%  Weight: 72.1 kg     Height:        Intake/Output Summary (Last 24 hours) at 06/17/2019 1312 Last data filed at 06/17/2019 1042 Gross per 24 hour  Intake 2068.54 ml  Output 1800 ml  Net 268.54 ml   Filed Weights   06/19/2019 1917 06/16/19 1508 06/17/19 0450  Weight: 70.3 kg 73.8 kg 72.1 kg     Data Reviewed:   CBC: Recent Labs  Lab 06/26/2019 1852 06/16/19 0506 06/17/19 0516  WBC 17.6* 12.4* 15.0*  NEUTROABS 14.9*  --   --   HGB 15.3 12.4* 13.6  HCT 45.9 37.8* 43.0  MCV 92.2 94.0 95.8  PLT 404* 321 [id number]   Basic Metabolic Panel: Recent Labs  Lab 06/14/2019 1852 06/16/19 0506 06/17/19 0516  NA 132* 138 133*  K 4.3 4.3 3.8  CL 100 108 104  CO2 21* 24 18*  GLUCOSE 128* 96 91  BUN 26* 18 14  CREATININE 1.16 1.15 0.92  CALCIUM 8.9 8.1* 8.0*  MG  --   --  3.8*   GFR: Estimated Creatinine Clearance: 50.9 mL/min (by C-G formula based on SCr of 0.92 mg/dL). Liver Function Tests: Recent Labs  Lab 06/21/2019 1852 06/16/19 0506  AST 53* 44*  ALT 19 13  ALKPHOS 73 58  BILITOT 0.6 1.1  PROT 6.6 4.9*  ALBUMIN 3.4* 2.4*   No results for input(s): LIPASE, AMYLASE in the last 168 hours. No results for input(s): AMMONIA in the last 168  hours. Coagulation Profile: Recent Labs  Lab 06/30/2019 1900 06/16/19 0506  INR 1.5* 1.7*   Cardiac Enzymes: No results for input(s): CKTOTAL, CKMB, CKMBINDEX, TROPONINI in the last 168 hours. BNP (last 3 results) No results for input(s): PROBNP in the last 8760 hours. HbA1C: No results for input(s): HGBA1C in the last 72 hours. CBG: No results for input(s): GLUCAP in the last 168 hours. Lipid Profile: No results for input(s): CHOL, HDL, LDLCALC, TRIG, CHOLHDL, LDLDIRECT in the last 72 hours. Thyroid Function Tests: No results for input(s): TSH, T4TOTAL, FREET4, T3FREE, THYROIDAB in the last 72 hours. Anemia Panel: No results for input(s): VITAMINB12, FOLATE, FERRITIN, TIBC, IRON, RETICCTPCT in the last 72 hours. Sepsis Labs: Recent Labs  Lab 06/06/2019 1852 06/07/2019 2134 06/16/19 0506  PROCALCITON  --   --  0.19  LATICACIDVEN 2.1* 2.0* 0.9    Recent Results (from the past 240 hour(s))  Culture, blood (routine x 2)     Status: None (Preliminary result)   Collection Time: 06/12/2019  6:45 PM   Specimen: BLOOD  Result Value Ref Range Status   Specimen Description BLOOD BLOOD RIGHT WRIST  Final  Special Requests   Final    BOTTLES DRAWN AEROBIC AND ANAEROBIC Blood Culture adequate volume   Culture NO GROWTH 2 DAYS  Final   Report Status PENDING  Incomplete  Culture, blood (routine x 2)     Status: None (Preliminary result)   Collection Time: 06/28/2019  6:45 PM   Specimen: BLOOD  Result Value Ref Range Status   Specimen Description BLOOD RIGHT UPPER ARM  Final   Special Requests   Final    BOTTLES DRAWN AEROBIC ONLY Blood Culture results may not be optimal due to an inadequate volume of blood received in culture bottles   Culture NO GROWTH 2 DAYS  Final   Report Status PENDING  Incomplete  Respiratory Panel by RT PCR (Flu A&B, Covid) - Nasopharyngeal Swab     Status: None   Collection Time: 06/22/2019  7:34 PM   Specimen: Nasopharyngeal Swab  Result Value Ref Range Status    SARS Coronavirus 2 by RT PCR NEGATIVE NEGATIVE Final    Comment: (NOTE) SARS-CoV-2 target nucleic acids are NOT DETECTED. The SARS-CoV-2 RNA is generally detectable in upper respiratoy specimens during the acute phase of infection. The lowest concentration of SARS-CoV-2 viral copies this assay can detect is 131 copies/mL. A negative result does not preclude SARS-Cov-2 infection and should not be used as the sole basis for treatment or other patient management decisions. A negative result may occur with  improper specimen collection/handling, submission of specimen other than nasopharyngeal swab, presence of viral mutation(s) within the areas targeted by this assay, and inadequate number of viral copies (<131 copies/mL). A negative result must be combined with clinical observations, patient history, and epidemiological information. The expected result is Negative. Fact Sheet for Patients:  PinkCheek.be Fact Sheet for Healthcare Providers:  GravelBags.it This test is not yet ap proved or cleared by the [place] FDA and  has been authorized for detection and/or diagnosis of SARS-CoV-2 by FDA under an Emergency Use Authorization (EUA). This EUA will remain  in effect (meaning this test can be used) for the duration of the COVID-19 declaration under Section 564(b)(1) of the Act, 21 U.S.C. section 360bbb-3(b)(1), unless the authorization is terminated or revoked sooner.    Influenza A by PCR NEGATIVE NEGATIVE Final   Influenza B by PCR NEGATIVE NEGATIVE Final    Comment: (NOTE) The Xpert Xpress SARS-CoV-2/FLU/RSV assay is intended as an aid in  the diagnosis of influenza from Nasopharyngeal swab specimens and  should not be used as a sole basis for treatment. Nasal washings and  aspirates are unacceptable for Xpert Xpress SARS-CoV-2/FLU/RSV  testing. Fact Sheet for Patients: PinkCheek.be Fact  Sheet for Healthcare Providers: GravelBags.it This test is not yet approved or cleared by the [place] FDA and  has been authorized for detection and/or diagnosis of SARS-CoV-2 by  FDA under an Emergency Use Authorization (EUA). This EUA will remain  in effect (meaning this test can be used) for the duration of the  Covid-19 declaration under Section 564(b)(1) of the Act, 21  U.S.C. section 360bbb-3(b)(1), unless the authorization is  terminated or revoked. Performed at [facility], [place] [address] [place], [place] [address]   Urine culture     Status: Abnormal   Collection Time: 06/19/2019  7:50 PM   Specimen: Urine, Catheterized  Result Value Ref Range Status   Specimen Description URINE, CATHETERIZED  Final   Special Requests   Final    NONE Performed at [facility], [place]  [address] [place], [place] [address]    Culture MULTIPLE SPECIES PRESENT, SUGGEST RECOLLECTION (A)  Final   Report Status 06/17/2019 FINAL  Final  MRSA PCR Screening     Status: None   Collection Time: 06/16/19 12:04 AM   Specimen: Nasal Mucosa; Nasopharyngeal  Result Value Ref Range Status   MRSA by PCR NEGATIVE NEGATIVE Final    Comment:        The GeneXpert MRSA Assay (FDA approved for NASAL specimens only), is one component of a comprehensive MRSA colonization surveillance program. It is not intended to diagnose MRSA infection nor to guide or monitor treatment for MRSA infections. Performed at [facility], [place] [address] [place], [place] [address]   Culture, blood (Routine X 2) w Reflex to ID Panel     Status: None (Preliminary result)   Collection Time: 06/16/19  6:20 PM   Specimen: BLOOD  Result Value Ref Range Status   Specimen Description BLOOD LEFT ANTECUBITAL  Final   Special Requests   Final    BOTTLES DRAWN AEROBIC ONLY Blood Culture results may not be optimal due to an inadequate volume of blood received in culture bottles Performed  at [place] [address] [place], [place] [address]    Culture NO GROWTH < 24 HOURS  Final   Report Status PENDING  Incomplete  Culture, blood (Routine X 2) w Reflex to ID Panel     Status: None (Preliminary result)   Collection Time: 06/16/19  6:26 PM   Specimen: BLOOD LEFT HAND  Result Value Ref Range Status   Specimen Description BLOOD LEFT HAND  Final   Special Requests   Final    BOTTLES DRAWN AEROBIC AND ANAEROBIC Blood Culture adequate volume Performed at [facility], [place] [address] [place], [place] [address]    Culture NO GROWTH < 24 HOURS  Final   Report Status PENDING  Incomplete         Radiology Studies: DG Chest 1 View  Result Date: 06/16/2019 CLINICAL DATA:  Cough.  Shortness of breath. EXAM: CHEST  1 VIEW COMPARISON:  10/04/2017.  CT 06/19/2015. FINDINGS: Cardiac pacer stable position. Cardiomegaly. No pulmonary venous congestion. Increase interstitial prominence noted bilaterally. Findings could represent interstitial edema/pneumonitis superimposed on known chronic interstitial lung disease. No pleural effusion or pneumothorax. No acute bony abnormality. IMPRESSION: 1. Cardiac pacer in stable position. Cardiomegaly. No pulmonary venous congestion. 2. Increase interstitial prominence noted bilaterally. Findings could represent interstitial edema/pneumonitis superimposed on known chronic interstitial lung disease. Electronically Signed   By: Marcello Moores  Register   On: 06/16/2019 06:56   DG CHEST PORT 1 VIEW  Result Date: 06/17/2019 CLINICAL DATA:  Hypoxia EXAM: PORTABLE CHEST 1 VIEW COMPARISON:  06/14/2019 FINDINGS: Single frontal view of the chest was performed. Evaluation limited by patient positioning and cooperation. Dual lead pacemaker unchanged. Cardiac silhouette is stable given differences in technique and positioning. Diffuse background scarring and fibrosis again noted. Overall, increasing consolidation is seen at the lung bases, right greater  than left. No pneumothorax. No large effusion. IMPRESSION: 1. Increasing basilar consolidation superimposed upon background fibrosis. This could reflect edema or infection. Electronically Signed   By: Randa Ngo M.D.   On: 06/17/2019 01:51   DG Chest Port 1 View  Result Date: 06/28/2019 CLINICAL DATA:  Short of breath EXAM: PORTABLE CHEST 1 VIEW COMPARISON:  10/04/2017 FINDINGS: Single frontal view of the chest demonstrates dual lead pacemaker unchanged. Cardiac silhouette is stable. There is progressive  scarring and fibrosis throughout the lungs, without acute airspace disease, effusion, or pneumothorax. No acute bony abnormalities. IMPRESSION: 1. Progressive scarring and fibrosis. No superimposed airspace disease. Electronically Signed   By: Randa Ngo M.D.   On: 06/22/2019 19:18   VAS [place] LOWER EXTREMITY VENOUS (DVT) (ONLY [facility] & [facility])  Result Date: 06/16/2019  Lower Venous DVTStudy Indications: Swelling, and Edema.  Comparison Study: no prior Performing Technologist: Abram Sander RVS  Examination Guidelines: A complete evaluation includes B-mode imaging, spectral Doppler, color Doppler, and power Doppler as needed of all accessible portions of each vessel. Bilateral testing is considered an integral part of a complete examination. Limited examinations for reoccurring indications may be performed as noted. The reflux portion of the exam is performed with the patient in reverse Trendelenburg.  +---------+---------------+---------+-----------+----------+--------------+ RIGHT    CompressibilityPhasicitySpontaneityPropertiesThrombus Aging +---------+---------------+---------+-----------+----------+--------------+ CFV      Full           Yes      Yes                                 +---------+---------------+---------+-----------+----------+--------------+ SFJ      Full                                                         +---------+---------------+---------+-----------+----------+--------------+ FV Prox  Full                                                        +---------+---------------+---------+-----------+----------+--------------+ FV Mid   Full                                                        +---------+---------------+---------+-----------+----------+--------------+ FV DistalFull                                                        +---------+---------------+---------+-----------+----------+--------------+ PFV      Full                                                        +---------+---------------+---------+-----------+----------+--------------+ POP      Full           Yes      Yes                  limited vis    +---------+---------------+---------+-----------+----------+--------------+ PTV      Full                                                        +---------+---------------+---------+-----------+----------+--------------+  PERO                                                  Not visualized +---------+---------------+---------+-----------+----------+--------------+     Summary: RIGHT: - There is no evidence of deep vein thrombosis in the lower extremity.  - No cystic structure found in the popliteal fossa.   *See table(s) above for measurements and observations. Electronically signed by Deitra Mayo MD on 06/16/2019 at 3:21:27 PM.    Final         Scheduled Meds: . apixaban  5 mg Oral BID  . aspirin EC  81 mg Oral QHS  . atorvastatin  80 mg Oral QHS  . feeding supplement (ENSURE ENLIVE)  237 mL Oral Q lunch  . furosemide  20 mg Intravenous Once  . levETIRAcetam  750 mg Oral BID  . multivitamin with minerals  1 tablet Oral Q breakfast  . QUEtiapine  25 mg Oral QHS   Continuous Infusions: . cefTRIAXone (ROCEPHIN)  IV Stopped (06/16/19 2039)  . vancomycin Stopped (06/16/19 2200)     LOS: 2 days   Time spent= 40 mins    Markeis Allman  Arsenio Loader, MD [facility]  If 7PM-7AM, please contact night-coverage  06/17/2019, 1:12 PM

## 2019-06-17 NOTE — Progress Notes (Signed)
Patient desating down into the upper 80's, nurse called for RT to assess. Patient probe is not picking up well. When the probe did pick up the sats showed 89%. RT increased the Liter Flow to 40% and the Fi02 to 45%. RT told nurse we will work on weaning as tolerated during the night.

## 2019-06-17 NOTE — Progress Notes (Signed)
Pts oxygen saturation fluctuates. Switched patient from non rebreather to HFNC  2l/min this morning. However after few hours oxygen dropped and currently  On HFNC needs 9-10 l.min

## 2019-06-17 NOTE — Progress Notes (Signed)
Patient decompensating down to 70's. Increased Flow to 15L HFNC. Will monitor the patient.

## 2019-06-17 NOTE — Discharge Instructions (Signed)

## 2019-06-18 ENCOUNTER — Inpatient Hospital Stay (HOSPITAL_COMMUNITY): Payer: Medicare Other

## 2019-06-18 DIAGNOSIS — I340 Nonrheumatic mitral (valve) insufficiency: Secondary | ICD-10-CM | POA: Diagnosis not present

## 2019-06-18 DIAGNOSIS — I361 Nonrheumatic tricuspid (valve) insufficiency: Secondary | ICD-10-CM

## 2019-06-18 DIAGNOSIS — J9601 Acute respiratory failure with hypoxia: Secondary | ICD-10-CM

## 2019-06-18 DIAGNOSIS — Z950 Presence of cardiac pacemaker: Secondary | ICD-10-CM

## 2019-06-18 DIAGNOSIS — J810 Acute pulmonary edema: Secondary | ICD-10-CM

## 2019-06-18 LAB — BASIC METABOLIC PANEL
Anion gap: 10 (ref 5–15)
BUN: 17 mg/dL (ref 8–23)
CO2: 21 mmol/L — ABNORMAL LOW (ref 22–32)
Calcium: 7.6 mg/dL — ABNORMAL LOW (ref 8.9–10.3)
Chloride: 104 mmol/L (ref 98–111)
Creatinine, Ser: 1.18 mg/dL (ref 0.61–1.24)
GFR calc Af Amer: >60 mL/min (ref >60)
GFR calc non Af Amer: 54 mL/min — ABNORMAL LOW (ref >60)
Glucose, Bld: 137 mg/dL — ABNORMAL HIGH (ref 70–99)
Potassium: 3.8 mmol/L (ref 3.5–5.1)
Sodium: 135 mmol/L (ref 135–145)

## 2019-06-18 LAB — MAGNESIUM: Magnesium: 1.8 mg/dL (ref 1.7–2.4)

## 2019-06-18 LAB — CBC
HCT: 37.7 % — ABNORMAL LOW (ref 39.0–52.0)
Hemoglobin: 12.2 g/dL — ABNORMAL LOW (ref 13.0–17.0)
MCH: 31 pg (ref 26.0–34.0)
MCHC: 32.4 g/dL (ref 30.0–36.0)
MCV: 95.7 fL (ref 80.0–100.0)
Platelets: 334 10*3/uL (ref 150–400)
RBC: 3.94 MIL/uL — ABNORMAL LOW (ref 4.22–5.81)
RDW: 14.7 % (ref 11.5–15.5)
WBC: 13.3 10*3/uL — ABNORMAL HIGH (ref 4.0–10.5)
nRBC: 0 % (ref 0.0–0.2)

## 2019-06-18 LAB — BRAIN NATRIURETIC PEPTIDE: B Natriuretic Peptide: 211.1 pg/mL — ABNORMAL HIGH (ref 0.0–100.0)

## 2019-06-18 LAB — ECHOCARDIOGRAM COMPLETE
Height: 67 in
Weight: 2370.39 oz

## 2019-06-18 MED ORDER — ALBUMIN HUMAN 5 % IV SOLN
25.0000 g | Freq: Once | INTRAVENOUS | Status: AC
  Administered 2019-06-18: 25 g via INTRAVENOUS
  Filled 2019-06-18: qty 500

## 2019-06-18 MED ORDER — SODIUM CHLORIDE 0.9 % IV BOLUS
250.0000 mL | INTRAVENOUS | Status: DC | PRN
  Administered 2019-06-18 (×2): 250 mL via INTRAVENOUS

## 2019-06-18 MED ORDER — SODIUM CHLORIDE 0.9 % IV BOLUS
1000.0000 mL | Freq: Once | INTRAVENOUS | Status: AC
  Administered 2019-06-18: 1000 mL via INTRAVENOUS

## 2019-06-18 MED ORDER — LINEZOLID 600 MG/300ML IV SOLN
600.0000 mg | Freq: Two times a day (BID) | INTRAVENOUS | Status: DC
  Administered 2019-06-18 – 2019-06-19 (×3): 600 mg via INTRAVENOUS
  Filled 2019-06-18 (×3): qty 300

## 2019-06-18 MED ORDER — SODIUM CHLORIDE 0.9 % IV BOLUS
500.0000 mL | Freq: Once | INTRAVENOUS | Status: AC
  Administered 2019-06-18: 500 mL via INTRAVENOUS

## 2019-06-18 MED ORDER — IPRATROPIUM-ALBUTEROL 0.5-2.5 (3) MG/3ML IN SOLN
3.0000 mL | Freq: Three times a day (TID) | RESPIRATORY_TRACT | Status: DC
  Administered 2019-06-18 – 2019-06-22 (×12): 3 mL via RESPIRATORY_TRACT
  Filled 2019-06-18 (×12): qty 3

## 2019-06-18 MED ORDER — HYDROCORTISONE NA SUCCINATE PF 100 MG IJ SOLR
100.0000 mg | Freq: Once | INTRAMUSCULAR | Status: AC
  Administered 2019-06-18: 100 mg via INTRAVENOUS
  Filled 2019-06-18: qty 2

## 2019-06-18 MED ORDER — IPRATROPIUM-ALBUTEROL 0.5-2.5 (3) MG/3ML IN SOLN
3.0000 mL | Freq: Three times a day (TID) | RESPIRATORY_TRACT | Status: DC
  Administered 2019-06-18: 09:00:00 3 mL via RESPIRATORY_TRACT
  Filled 2019-06-18: qty 3

## 2019-06-18 MED ORDER — SODIUM CHLORIDE 0.9 % IV SOLN
INTRAVENOUS | Status: DC | PRN
  Administered 2019-06-18: 250 mL via INTRAVENOUS

## 2019-06-18 MED ORDER — FUROSEMIDE 20 MG PO TABS
20.0000 mg | ORAL_TABLET | Freq: Once | ORAL | Status: AC
  Administered 2019-06-18: 20 mg via ORAL
  Filled 2019-06-18: qty 1

## 2019-06-18 NOTE — Progress Notes (Signed)
2000 Pt highly agitated and pulling at [place] and IV tubing. Daughter and wife in room preparing to leave for the night, requested he takes his haldol with his night time meds. Pt has on mittens for his protection. Repositioned in bed. Clean and dry. No s/sx of distress. No needs. 0000 Pts BP 69/48 Pt alert and still confused. No s/sx of distress. RR even and unlabored at this time. Paged Opyd, MD. Will continue to monitor. 0037 NS 556ml bolus given without complications.  0050 BP 73/48 Goal is to sustain MAP 60 or above, 58 at this time. 0100 BP 87/55, MAP 66 Pt repositioned in bed. Clean and dry. Family updated by MD and Primary RN. No s/sx of distress. Will continue to monitor. 0120 BP 64/42 Paged Opyd, MD. 0135  250 ml NS bolus given per MD orders. 0145 BP 87/49, MAP 62. Will continue to monitor. 0200 BP 78/42, MAP 13 0215 Opyd, MD paged. 0230 BP 70/37, MAP 47  0235 270ml NS bolus given per MD orders. 0300 BP 80/48, MAP 58. Pt resting comfortably in bed. No s/sx of distress. RR unlabored. 0330 BP 90/50, MAP 63. NS started at 275ml/hour x 1L per MD orders.No s/sx of distress. No needs. Will continue to monitor.

## 2019-06-18 NOTE — Care Management Important Message (Signed)
Important Message  Patient Details  Name: PRAYAN LANGEL MRN: [id number] Date of Birth: December 30, 1927   Medicare Important Message Given:  Yes     Shelda Altes 06/18/2019, 9:09 AM

## 2019-06-18 NOTE — Progress Notes (Signed)
Patient awake, confused, pulling at lines upon shift report. Mittens in place, family en route per off-going nurse.   Last set of VS are as follows:  BP 106/63 (BP Location: Left Arm)   Pulse 72   Temp (!) 97.4 F (36.3 C)   Resp 17   Ht 5\' 7"  (1.702 m)   Wt 67.2 kg   SpO2 100%   BMI 23.20 kg/m   Albumin is currently infusing, 1 L NS infusion soon to end per order.

## 2019-06-18 NOTE — Progress Notes (Addendum)
PROGRESS NOTE    Herbert Marquez  [id number] DOB: 1927/06/29 DOA: 06/13/2019 PCP: No primary care provider on file.   Brief Narrative:  86 year old with history of multiple CVAs with residual effect, HTN, DM2, CAD, paroxysmal A. fib, BPH, ataxia, COPD, CKD stage III AAA presented to the hospital with complaints of weakness and fever.  Patient was diagnosed with septic shock secondary to possible urinary tract infection or right lower extremity cellulitis.  Started on IV Rocephin and vancomycin. Intermittently patient has become progressively hypoxic and hypotensive. Initially required diuretics and then some fluid bolus.  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 -No bacteria seen on UA. -Cultures have remained negative -IV vancomycin and Rocephin -IV fluids discontinued. Intermittent fluid bolus as needed -Supportive care  Acute hypoxic respiratory failure secondary to fluid overload Pulmonary edema -Still requiring significant amount of supplemental oxygen as high as 10 L high flow.  Chest x-ray this morning shows pulmonary edema.  He is very sensitive to IV Lasix  therefore will give 20 mg of p.o. Lasix once -Bronchodilators 3 times daily, incentive spirometer -Check BNP. -Procalcitonin 0.19 -COVID-19-negative -Echocardiogram ordered -Low suspicion for PE as patient is already on Eliquis -Speech and swallow evaluation.  Aspiration precaution.  Delirium, intermittent -No focal neuro deficits but having moments of confusion and agitation.  CKD stage IIIa -Creatinine around baseline.  History of CVA -On aspirin, statin, Eliquis  GERD -PPI  Paroxysmal atrial fibrillation -Paced rhythm.  Continue Eliquis  History of COPD -As needed bronchodilators  History of seizures -Keppra 750 mg twice daily  Prolonged QTC -Continue Seroquel.  QTC is improved, 503.   DVT prophylaxis: Eliquis Code Status: DNR Family Communication: Wife and daughter at bedside Disposition Plan:   Patient From= home  Patient Anticipated D/C place= to be determined  Barriers= patient is still quite ill requiring significant amount of supplemental oxygen-10 L high flow nasal cannula, agitated. Unsafe for discharge.  Subjective: Overnight patient became hypotensive and hypoxic requiring small fluid bolus, stress dose hydrocortisone and increase in supplemental oxygen.  During my evaluation patient was calm and comfortable resting without any issues.  He woke up as well speaking with his family and he was able to interact with me without any issues  Review of Systems Otherwise negative except as per HPI, including: General: Denies fever, chills, night sweats or unintended weight loss. Resp: Denies hemoptysis Cardiac: Denies chest pain, palpitations, orthopnea, paroxysmal nocturnal dyspnea. GI: Denies abdominal pain, nausea, vomiting, diarrhea or constipation GU: Denies dysuria, frequency, hesitancy or incontinence MS: Denies muscle aches, joint pain or swelling Neuro: Denies headache, neurologic deficits (focal weakness, numbness, tingling), abnormal gait Psych:  Denies anxiety, depression, SI/HI/AVH Skin: Denies new rashes or  lesions ID: Denies sick contacts, exotic exposures, travel  Examination: Constitutional: Elderly frail, 7 L high flow nasal cannula, hard of hearing Respiratory: Diffuse bilateral rhonchi Cardiovascular: Normal sinus rhythm, no rubs Abdomen: Nontender nondistended good bowel sounds Musculoskeletal: No edema noted Skin: No rashes seen Neurologic: CN 2-12 grossly intact.  And nonfocal Psychiatric: Poor judgment and insight. Alert and oriented x 3. Normal mood.  Objective: Vitals:   06/18/19 0302 06/18/19 0332 06/18/19 0527 06/18/19 0700  BP: (!) 80/48 (!) 90/50 106/63 103/62  Pulse: 70 70 72 72  Resp: 20 18 17 19   Temp:  (!) 97.4 F (36.3 C)    TempSrc:      SpO2: 100% 100% 100% 100%  Weight:      Height:        Intake/Output Summary (Last 24 hours) at 06/18/2019 0813 Last data filed at 06/18/2019 0738 Gross per 24 hour  Intake 1234.96 ml  Output 2550 ml  Net -1315.04 ml   Filed Weights   06/16/19 1508 06/17/19 0450 06/18/19 0010  Weight: 73.8 kg 72.1 kg 67.2 kg     Data Reviewed:   CBC: Recent Labs  Lab 06/14/2019 1852 06/16/19 0506 06/17/19 0516 06/18/19 0558  WBC 17.6* 12.4* 15.0* 13.3*  NEUTROABS 14.9*  --   --   --   HGB 15.3 12.4* 13.6 12.2*  HCT 45.9 37.8* 43.0 37.7*  MCV 92.2 94.0 95.8 95.7  PLT 404* 321 345 [id number]   Basic Metabolic Panel: Recent Labs  Lab 06/20/2019 1852 06/16/19 0506 06/17/19 0516 06/18/19 0558  NA 132* 138 133* 135  K 4.3 4.3 3.8 3.8  CL 100 108 104 104  CO2 21* 24 18* 21*  GLUCOSE 128* 96 91 137*  BUN 26* 18 14 17   CREATININE 1.16 1.15 0.92 1.18  CALCIUM 8.9 8.1* 8.0* 7.6*  MG  --   --  3.8* 1.8   GFR: Estimated Creatinine Clearance: 39.7 mL/min (by C-G formula based on SCr of 1.18 mg/dL). Liver Function Tests: Recent Labs  Lab 06/18/2019 1852 06/16/19 0506  AST 53* 44*  ALT 19 13  ALKPHOS 73 58  BILITOT 0.6 1.1  PROT 6.6 4.9*  ALBUMIN 3.4* 2.4*   No  results for input(s): LIPASE, AMYLASE in the last 168 hours. No results for input(s): AMMONIA in the last 168 hours. Coagulation Profile: Recent Labs  Lab 06/11/2019 1900 06/16/19 0506  INR 1.5* 1.7*   Cardiac Enzymes: No results for input(s): CKTOTAL, CKMB, CKMBINDEX, TROPONINI in the last 168 hours. BNP (last 3 results) No results for input(s): PROBNP in the last 8760 hours. HbA1C: No results for input(s): HGBA1C in the last 72 hours. CBG: No results for input(s): GLUCAP in the last 168 hours. Lipid Profile: No results for input(s): CHOL, HDL, LDLCALC, TRIG, CHOLHDL, LDLDIRECT in the last 72 hours. Thyroid Function Tests: No results for input(s): TSH, T4TOTAL, FREET4, T3FREE, THYROIDAB in the last 72 hours. Anemia Panel: No results for input(s): VITAMINB12, FOLATE, FERRITIN, TIBC, IRON, RETICCTPCT in the last 72 hours. Sepsis Labs: Recent Labs  Lab 06/04/2019 1852 06/22/2019 2134 06/16/19 0506  PROCALCITON  --   --  0.19  LATICACIDVEN 2.1* 2.0* 0.9    Recent Results (from the past 240 hour(s))  Culture, blood (routine x 2)     Status: None (Preliminary result)   Collection Time: 06/27/2019  6:45 PM   Specimen: BLOOD  Result Value Ref Range Status   Specimen Description BLOOD BLOOD RIGHT WRIST  Final   Special Requests  Final    BOTTLES DRAWN AEROBIC AND ANAEROBIC Blood Culture adequate volume   Culture NO GROWTH 2 DAYS  Final   Report Status PENDING  Incomplete  Culture, blood (routine x 2)     Status: None (Preliminary result)   Collection Time: 06/26/2019  6:45 PM   Specimen: BLOOD  Result Value Ref Range Status   Specimen Description BLOOD RIGHT UPPER ARM  Final   Special Requests   Final    BOTTLES DRAWN AEROBIC ONLY Blood Culture results may not be optimal due to an inadequate volume of blood received in culture bottles   Culture NO GROWTH 2 DAYS  Final   Report Status PENDING  Incomplete  Respiratory Panel by RT PCR (Flu A&B, Covid) - Nasopharyngeal Swab     Status:  None   Collection Time: 07/03/2019  7:34 PM   Specimen: Nasopharyngeal Swab  Result Value Ref Range Status   SARS Coronavirus 2 by RT PCR NEGATIVE NEGATIVE Final    Comment: (NOTE) SARS-CoV-2 target nucleic acids are NOT DETECTED. The SARS-CoV-2 RNA is generally detectable in upper respiratoy specimens during the acute phase of infection. The lowest concentration of SARS-CoV-2 viral copies this assay can detect is 131 copies/mL. A negative result does not preclude SARS-Cov-2 infection and should not be used as the sole basis for treatment or other patient management decisions. A negative result may occur with  improper specimen collection/handling, submission of specimen other than nasopharyngeal swab, presence of viral mutation(s) within the areas targeted by this assay, and inadequate number of viral copies (<131 copies/mL). A negative result must be combined with clinical observations, patient history, and epidemiological information. The expected result is Negative. Fact Sheet for Patients:  PinkCheek.be Fact Sheet for Healthcare Providers:  GravelBags.it This test is not yet ap proved or cleared by the [place] FDA and  has been authorized for detection and/or diagnosis of SARS-CoV-2 by FDA under an Emergency Use Authorization (EUA). This EUA will remain  in effect (meaning this test can be used) for the duration of the COVID-19 declaration under Section 564(b)(1) of the Act, 21 U.S.C. section 360bbb-3(b)(1), unless the authorization is terminated or revoked sooner.    Influenza A by PCR NEGATIVE NEGATIVE Final   Influenza B by PCR NEGATIVE NEGATIVE Final    Comment: (NOTE) The Xpert Xpress SARS-CoV-2/FLU/RSV assay is intended as an aid in  the diagnosis of influenza from Nasopharyngeal swab specimens and  should not be used as a sole basis for treatment. Nasal washings and  aspirates are unacceptable for Xpert  Xpress SARS-CoV-2/FLU/RSV  testing. Fact Sheet for Patients: PinkCheek.be Fact Sheet for Healthcare Providers: GravelBags.it This test is not yet approved or cleared by the [place] FDA and  has been authorized for detection and/or diagnosis of SARS-CoV-2 by  FDA under an Emergency Use Authorization (EUA). This EUA will remain  in effect (meaning this test can be used) for the duration of the  Covid-19 declaration under Section 564(b)(1) of the Act, 21  U.S.C. section 360bbb-3(b)(1), unless the authorization is  terminated or revoked. Performed at [facility], [place] [address], [place], [place] [address]   Urine culture     Status: Abnormal   Collection Time: 06/14/2019  7:50 PM   Specimen: Urine, Catheterized  Result Value Ref Range Status   Specimen Description URINE, CATHETERIZED  Final   Special Requests   Final    NONE Performed at [facility], [place] [address], [place], [place]  [address]    Culture MULTIPLE SPECIES PRESENT, SUGGEST RECOLLECTION (A)  Final   Report Status 06/17/2019 FINAL  Final  MRSA PCR Screening     Status: None   Collection Time: 06/16/19 12:04 AM   Specimen: Nasal Mucosa; Nasopharyngeal  Result Value Ref Range Status   MRSA by PCR NEGATIVE NEGATIVE Final    Comment:        The GeneXpert MRSA Assay (FDA approved for NASAL specimens only), is one component of a comprehensive MRSA colonization surveillance program. It is not intended to diagnose MRSA infection nor to guide or monitor treatment for MRSA infections. Performed at [facility], [place] [address] [place], [place] [address]   Culture, blood (Routine X 2) w Reflex to ID Panel     Status: None (Preliminary result)   Collection Time: 06/16/19  6:20 PM   Specimen: BLOOD  Result Value Ref Range Status   Specimen Description BLOOD LEFT ANTECUBITAL  Final   Special Requests   Final    BOTTLES DRAWN AEROBIC ONLY Blood  Culture results may not be optimal due to an inadequate volume of blood received in culture bottles Performed at [place] [address], [place], [place] [address]    Culture NO GROWTH < 24 HOURS  Final   Report Status PENDING  Incomplete  Culture, blood (Routine X 2) w Reflex to ID Panel     Status: None (Preliminary result)   Collection Time: 06/16/19  6:26 PM   Specimen: BLOOD LEFT HAND  Result Value Ref Range Status   Specimen Description BLOOD LEFT HAND  Final   Special Requests   Final    BOTTLES DRAWN AEROBIC AND ANAEROBIC Blood Culture adequate volume Performed at [facility], [place] [address] [place], [place] [address]    Culture NO GROWTH < 24 HOURS  Final   Report Status PENDING  Incomplete         Radiology Studies: DG CHEST PORT 1 VIEW  Result Date: 06/17/2019 CLINICAL DATA:  Hypoxia EXAM: PORTABLE CHEST 1 VIEW COMPARISON:  06/17/2019 FINDINGS: Single frontal view of the chest was performed. Evaluation limited by patient positioning and cooperation. Dual lead pacemaker unchanged. Cardiac silhouette is stable given differences in technique and positioning. Diffuse background scarring and fibrosis again noted. Overall, increasing consolidation is seen at the lung bases, right greater than left. No pneumothorax. No large effusion. IMPRESSION: 1. Increasing basilar consolidation superimposed upon background fibrosis. This could reflect edema or infection. Electronically Signed   By: Randa Ngo M.D.   On: 06/17/2019 01:51        Scheduled Meds: . apixaban  5 mg Oral BID  . aspirin EC  81 mg Oral QHS  . atorvastatin  80 mg Oral QHS  . feeding supplement (ENSURE ENLIVE)  237 mL Oral Q lunch  . levETIRAcetam  750 mg Oral BID  . multivitamin with minerals  1 tablet Oral Q breakfast  . QUEtiapine  25 mg Oral QHS   Continuous Infusions: . cefTRIAXone (ROCEPHIN)  IV Stopped (06/17/19 2055)  . sodium chloride 250 mL (06/18/19 0237)  . vancomycin  Stopped (06/17/19 2320)     LOS: 3 days   Time spent= 40 mins    Teresha Hanks Arsenio Loader, MD [facility]  If 7PM-7AM, please contact night-coverage  06/18/2019, 8:13 AM

## 2019-06-18 NOTE — Progress Notes (Signed)
Wife and Son arrive at bedside.

## 2019-06-18 NOTE — Significant Event (Signed)
PROGRESS NOTE    Herbert Marquez  [id number] DOB: 1927/09/23 DOA: 06/20/2019 PCP: No primary care provider on file.   Chief Complaint  Patient presents with  . Respiratory Distress    Brief Narrative:   86 year old with history of multiple CVAs with residual effect, HTN, DM2, CAD, paroxysmal A. fib, BPH, ataxia, COPD, CKD stage III AAA presented to the hospital with complaints of weakness and fever.  Patient was diagnosed with septic shock secondary to possible urinary tract infection or right lower extremity cellulitis.  Started on IV Rocephin and vancomycin.  Acute Event: Called regarding SBP in 60s.   Patient was admitted with septic shock. BP stabilized after aggressive fliud-resuscitation the early am of 4/13 but he then developed pulm edema with acute hypoxic respiratory failure and was being diuresed yesterday.   Tonight, he continues to require high-flow oxygen with FiO2 of 45% to maintain saturations in upper 80s/low 90s.   He remains confused, mildly tachypneic but not in acute respiratory distress. BP declined further with systolic 50.    Assessment & Plan:  1. Septic shock  2. Pulmonary edema  3. Acute hypoxic respiratory failure   Very difficult situation, giving 500 cc NS bolus now and then colloid for ongoing volume expansion/BP support. Will also give 100 mg IV hydrocortisone now.   Wife Herbert Marquez) and daughter Herbert Marquez) were updated by phone. They understand that Herbert Marquez is critically-ill and worsening.      Objective: Vitals:   06/17/19 2023 06/17/19 2300 06/18/19 0010 06/18/19 0050  BP:   (!) 69/48 (!) 73/48  Pulse:  98 74 70  Resp:  (!) 22 20 20   Temp: 99.1 F (37.3 C)  97.7 F (36.5 C)   TempSrc:      SpO2:  90% 100% 100%  Weight:   67.2 kg   Height:        Intake/Output Summary (Last 24 hours) at 06/18/2019 0055 Last data filed at 06/17/2019 2219 Gross per 24 hour  Intake 1636.6 ml  Output 2550 ml  Net -913.4 ml   Filed Weights    06/16/19 1508 06/17/19 0450 06/18/19 0010  Weight: 73.8 kg 72.1 kg 67.2 kg    Examination:  General exam: Confused. No diaphoresis.  Respiratory system: Coarse rales. Tachypnea. Accessory muscle recruitment. No pallor or cyanosis.  Cardiovascular system: S1 & S2 heard, RRR. No JVD. No pedal edema. Gastrointestinal system: Abdomen is nondistended, soft and nontender. Normal bowel sounds heard. Central nervous system: Alert, disoriented. Moving all extremities.  Extremities: No gross deformity. No red/hot/swollen joints. Skin: No rashes. Cool distal extremities.  Psychiatry: Calm and cooperative but confused.      Data Reviewed: I have personally reviewed following labs and imaging studies  CBC: Recent Labs  Lab 06/09/2019 1852 06/16/19 0506 06/17/19 0516  WBC 17.6* 12.4* 15.0*  NEUTROABS 14.9*  --   --   HGB 15.3 12.4* 13.6  HCT 45.9 37.8* 43.0  MCV 92.2 94.0 95.8  PLT 404* 321 [id number]    Basic Metabolic Panel: Recent Labs  Lab 06/28/2019 1852 06/16/19 0506 06/17/19 0516  NA 132* 138 133*  K 4.3 4.3 3.8  CL 100 108 104  CO2 21* 24 18*  GLUCOSE 128* 96 91  BUN 26* 18 14  CREATININE 1.16 1.15 0.92  CALCIUM 8.9 8.1* 8.0*  MG  --   --  3.8*    GFR: Estimated Creatinine Clearance: 50.9 mL/min (by C-G formula based on SCr of 0.92 mg/dL).  Liver  Function Tests: Recent Labs  Lab 06/05/2019 1852 06/16/19 0506  AST 53* 44*  ALT 19 13  ALKPHOS 73 58  BILITOT 0.6 1.1  PROT 6.6 4.9*  ALBUMIN 3.4* 2.4*    CBG: No results for input(s): GLUCAP in the last 168 hours.   Recent Results (from the past 240 hour(s))  Culture, blood (routine x 2)     Status: None (Preliminary result)   Collection Time: 06/28/2019  6:45 PM   Specimen: BLOOD  Result Value Ref Range Status   Specimen Description BLOOD BLOOD RIGHT WRIST  Final   Special Requests   Final    BOTTLES DRAWN AEROBIC AND ANAEROBIC Blood Culture adequate volume   Culture NO GROWTH 2 DAYS  Final   Report Status  PENDING  Incomplete  Culture, blood (routine x 2)     Status: None (Preliminary result)   Collection Time: 07/03/2019  6:45 PM   Specimen: BLOOD  Result Value Ref Range Status   Specimen Description BLOOD RIGHT UPPER ARM  Final   Special Requests   Final    BOTTLES DRAWN AEROBIC ONLY Blood Culture results may not be optimal due to an inadequate volume of blood received in culture bottles   Culture NO GROWTH 2 DAYS  Final   Report Status PENDING  Incomplete  Respiratory Panel by RT PCR (Flu A&B, Covid) - Nasopharyngeal Swab     Status: None   Collection Time: 06/22/2019  7:34 PM   Specimen: Nasopharyngeal Swab  Result Value Ref Range Status   SARS Coronavirus 2 by RT PCR NEGATIVE NEGATIVE Final    Comment: (NOTE) SARS-CoV-2 target nucleic acids are NOT DETECTED. The SARS-CoV-2 RNA is generally detectable in upper respiratoy specimens during the acute phase of infection. The lowest concentration of SARS-CoV-2 viral copies this assay can detect is 131 copies/mL. A negative result does not preclude SARS-Cov-2 infection and should not be used as the sole basis for treatment or other patient management decisions. A negative result may occur with  improper specimen collection/handling, submission of specimen other than nasopharyngeal swab, presence of viral mutation(s) within the areas targeted by this assay, and inadequate number of viral copies (<131 copies/mL). A negative result must be combined with clinical observations, patient history, and epidemiological information. The expected result is Negative. Fact Sheet for Patients:  PinkCheek.be Fact Sheet for Healthcare Providers:  GravelBags.it This test is not yet ap proved or cleared by the [place] FDA and  has been authorized for detection and/or diagnosis of SARS-CoV-2 by FDA under an Emergency Use Authorization (EUA). This EUA will remain  in effect (meaning this test can  be used) for the duration of the COVID-19 declaration under Section 564(b)(1) of the Act, 21 U.S.C. section 360bbb-3(b)(1), unless the authorization is terminated or revoked sooner.    Influenza A by PCR NEGATIVE NEGATIVE Final   Influenza B by PCR NEGATIVE NEGATIVE Final    Comment: (NOTE) The Xpert Xpress SARS-CoV-2/FLU/RSV assay is intended as an aid in  the diagnosis of influenza from Nasopharyngeal swab specimens and  should not be used as a sole basis for treatment. Nasal washings and  aspirates are unacceptable for Xpert Xpress SARS-CoV-2/FLU/RSV  testing. Fact Sheet for Patients: PinkCheek.be Fact Sheet for Healthcare Providers: GravelBags.it This test is not yet approved or cleared by the [place] FDA and  has been authorized for detection and/or diagnosis of SARS-CoV-2 by  FDA under an Emergency Use Authorization (EUA). This EUA will remain  in effect (meaning  this test can be used) for the duration of the  Covid-19 declaration under Section 564(b)(1) of the Act, 21  U.S.C. section 360bbb-3(b)(1), unless the authorization is  terminated or revoked. Performed at [facility], [place] [address] [place], [place] [address]   Urine culture     Status: Abnormal   Collection Time: 06/28/2019  7:50 PM   Specimen: Urine, Catheterized  Result Value Ref Range Status   Specimen Description URINE, CATHETERIZED  Final   Special Requests   Final    NONE Performed at [facility], [place] [address] [place], [place] [address]    Culture MULTIPLE SPECIES PRESENT, SUGGEST RECOLLECTION (A)  Final   Report Status 06/17/2019 FINAL  Final  MRSA PCR Screening     Status: None   Collection Time: 06/16/19 12:04 AM   Specimen: Nasal Mucosa; Nasopharyngeal  Result Value Ref Range Status   MRSA by PCR NEGATIVE NEGATIVE Final    Comment:        The GeneXpert MRSA Assay (FDA approved for NASAL specimens only), is one  component of a comprehensive MRSA colonization surveillance program. It is not intended to diagnose MRSA infection nor to guide or monitor treatment for MRSA infections. Performed at [facility], [place] [address] [place], [place] [address]   Culture, blood (Routine X 2) w Reflex to ID Panel     Status: None (Preliminary result)   Collection Time: 06/16/19  6:20 PM   Specimen: BLOOD  Result Value Ref Range Status   Specimen Description BLOOD LEFT ANTECUBITAL  Final   Special Requests   Final    BOTTLES DRAWN AEROBIC ONLY Blood Culture results may not be optimal due to an inadequate volume of blood received in culture bottles Performed at [place] [address], [place], [place] [address]    Culture NO GROWTH < 24 HOURS  Final   Report Status PENDING  Incomplete  Culture, blood (Routine X 2) w Reflex to ID Panel     Status: None (Preliminary result)   Collection Time: 06/16/19  6:26 PM   Specimen: BLOOD LEFT HAND  Result Value Ref Range Status   Specimen Description BLOOD LEFT HAND  Final   Special Requests   Final    BOTTLES DRAWN AEROBIC AND ANAEROBIC Blood Culture adequate volume Performed at [facility], [place] [address], [place], [place] [address]    Culture NO GROWTH < 24 HOURS  Final   Report Status PENDING  Incomplete         Radiology Studies: DG CHEST PORT 1 VIEW  Result Date: 06/17/2019 CLINICAL DATA:  Hypoxia EXAM: PORTABLE CHEST 1 VIEW COMPARISON:  06/10/2019 FINDINGS: Single frontal view of the chest was performed. Evaluation limited by patient positioning and cooperation. Dual lead pacemaker unchanged. Cardiac silhouette is stable given differences in technique and positioning. Diffuse background scarring and fibrosis again noted. Overall, increasing consolidation is seen at the lung bases, right greater than left. No pneumothorax. No large effusion. IMPRESSION: 1. Increasing basilar consolidation superimposed upon background fibrosis. This  could reflect edema or infection. Electronically Signed   By: Randa Ngo M.D.   On: 06/17/2019 01:51        Scheduled Meds: . apixaban  5 mg Oral BID  . aspirin EC  81 mg Oral QHS  . atorvastatin  80 mg Oral QHS  . feeding supplement (ENSURE ENLIVE)  237 mL Oral Q lunch  . levETIRAcetam  750 mg  Oral BID  . multivitamin with minerals  1 tablet Oral Q breakfast  . QUEtiapine  25 mg Oral QHS   Continuous Infusions: . cefTRIAXone (ROCEPHIN)  IV 2 g (06/17/19 2022)  . vancomycin 1,000 mg (06/17/19 2218)     LOS: 3 days    Time spent: 42 minutes.   Patient is critically-ill with septic shock and acute hypoxic respiratory failure requiring highly complex decision making and intervention to prevent ongoing decompensation and loss of life.     Vianne Bulls, MD [facility]   To contact the attending provider between 7A-7P or the covering provider during after hours 7P-7A, please log into the web site [email or web address] and access using universal [place] password for that web site. If you do not have the password, please call the hospital operator.  06/18/2019, 12:55 AM

## 2019-06-18 NOTE — Progress Notes (Signed)
  Echocardiogram 2D Echocardiogram has been performed.  Herbert Marquez 06/18/2019, 3:06 PM

## 2019-06-18 NOTE — Progress Notes (Signed)
Pt alert and receptive to teaching. Incentive spirometer received since ordering/requesting earlier today. Pt demonstrated understanding, and familiarity from use at home. Wife remains at bedside and plans to spend the night.

## 2019-06-18 NOTE — Progress Notes (Signed)
Call from [place] regarding low O2 saturation, pt found to be eating, with speech therapy and family at bedside. Pt's O2 tubing had slipped out of position in combination with eating rather rapidly there was in fact a drop in his saturation to low 70s. Pt did however remain alert/cooperative/actively eating with the low saturation.    Canula replaced, pt advised to break from eating for a minute. Slowly patient's oxygen level returned to 90%.

## 2019-06-18 NOTE — Evaluation (Signed)
Clinical/Bedside Swallow Evaluation Patient Details  Name: Herbert Marquez MRN: [id number] Date of Birth: 1927/11/10  Today's Date: 06/18/2019 Time: SLP Start Time (ACUTE ONLY): 1207 SLP Stop Time (ACUTE ONLY): 1228 SLP Time Calculation (min) (ACUTE ONLY): 21 min  Past Medical History:  Past Medical History:  Diagnosis Date  . Acute blood loss anemia   . Acute encephalopathy 06/04/2016  . Acute ischemic stroke ([place]) - L temporal lobe s/p tPA 03/30/2016  . Altered mental status   . Arthritis    "pretty much all over"   . 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 Atrial fibrillation with rapid ventricular response ([place])   . Basal cell carcinoma    "several burned off his body, face, head"  . Benign essential HTN   . BPH (benign prostatic hypertrophy)   . Cerebral hemorrhage (HCC) w/ SDH s/p IV tPA   . Cerebral thrombosis with cerebral infarction 10/05/2017  . Cerebrovascular accident (CVA) ([place]) 09/18/2015  . Chronic anticoagulation   . CKD (chronic kidney disease), stage II 11/25/2015  . COPD (chronic obstructive pulmonary disease) ([place]) 03/09/2011  . Coronary artery disease    a. s/p PCI of RCA in 2006  . Coronary artery disease involving native coronary artery of native heart without angina pectoris   . CVA (cerebral infarction)    a. 06/2015: left thalamic and bilateral PCA  . Dementia without behavioral disturbance ([place])   . Dysphagia as late effect of cerebrovascular disease   . Embolic stroke ([place]) [id number]  . Expressive aphasia 10/04/2017  . Gait disturbance, post-stroke 06/23/2015  . GERD (gastroesophageal reflux disease)   . Hemiparesis and other late effects of cerebrovascular accident ([place]) 06/29/2016  . History of stroke 04/04/2016  . HLD (hyperlipidemia)   . Hyperlipidemia   . Hypertension   . Ischemic stroke ([place])   . Low blood pressure reading 10/14/2017  . Orthostatic hypotension   . PAF (paroxysmal atrial fibrillation) ([place])   . Presence of permanent cardiac pacemaker    . Right hemiparesis ([place])   . Second degree Mobitz II AV block 10/23/2016  . Seizures ([place])   . Stroke ([place])   . Thalamic infarction ([place]) 06/21/2015  . TIA (transient ischemic attack)    Approximately 6 weeks post-cardiac catheterization.   . Type 2 diabetes mellitus with complication, without long-term current use of insulin ([place])   . Vascular dementia with behavior disturbance ([place]) 06/29/2016   Past Surgical History:  Past Surgical History:  Procedure Laterality Date  . CARDIOVASCULAR STRESS TEST  07/01/2007   EF 74%  . CATARACT EXTRACTION, BILATERAL    . CORONARY ANGIOPLASTY WITH STENT PLACEMENT  10/2004   stenting x 2 to RCA  . FEMUR IM NAIL Right 11/26/2015   Procedure: INTRAMEDULLARY RIGHT (IM) NAIL FEMORAL;  Surgeon: Rod Can, MD;  Location: [facility];  Service: Orthopedics;  Laterality: Right;  . FRACTURE SURGERY    . HERNIA REPAIR    . HIP ARTHROPLASTY  03/09/2011   Procedure: ARTHROPLASTY BIPOLAR HIP;  Surgeon: Mauri Pole;  Location: [facility];  Service: Orthopedics;  Laterality: Left;  . INSERT / REPLACE / REMOVE PACEMAKER  10/23/2016  . LAPAROSCOPIC INCISIONAL / UMBILICAL / VENTRAL HERNIA REPAIR     "below his naval"  . PACEMAKER IMPLANT N/A 10/23/2016   Procedure: Pacemaker Implant;  Surgeon: Thompson Grayer, MD;  Location: [facility];  Service: Cardiovascular;  Laterality: N/A;   HPI:  Pt is a 86 y.o. male with medical history significant of  multiple CVAs with residual effects, hypertension, diabetes, coronary artery disease, paroxysmal atrial fibrillation, BPH, ataxia, COPD, chronic kidney disease stage III who was brought in by his wife secondary to weakness and fever. CXR of 4/15: Persistent diffuse BILATERAL interstitial infiltrates, which could represent pulmonary edema or atypical infection. WBC elevated and pt afebrile at time of eval.   Assessment / Plan / Recommendation Clinical Impression  Pt was seen for bedside swallow evaluation with his wife  present. Pt's wife reported that the pt consumes a regular texture diet at home but that his mastication time is frequently very prolonged and that he demonstrates right-sided pocketing. Per his wife, he needs cueing to slow down and to swallow prior to adding an additional bite. Oral mechanism was grossly within functional limits but pt did exhibit some difficulty following commands. He tolerated puree solids and thin liquids via cup without overt s/sx of aspiration but exhibited throat clearing and coughing with thin liquids via straw and with regular texture solids, suggesting possible aspiration. A dysphagia 1 (puree) diet with thin liquids is recommended at this time. A modified barium swallow study is also recommended to further assess the functional integrity of the swallow mechanism.  SLP Visit Diagnosis: Dysphagia, oropharyngeal phase (R13.12)    Aspiration Risk  Mild aspiration risk    Diet Recommendation Dysphagia 1 (Puree);Thin liquid   Liquid Administration via: No straw;Cup Medication Administration: Crushed with puree Supervision: Staff to assist with self feeding Compensations: Minimize environmental distractions;Slow rate;Small sips/bites;Follow solids with liquid Postural Changes: Seated upright at 90 degrees;Remain upright for at least 30 minutes after po intake    Other  Recommendations Oral Care Recommendations: Oral care BID   Follow up Recommendations Other (comment)(tbd)      Frequency and Duration min 2x/week  2 weeks       Prognosis Prognosis for Safe Diet Advancement: Fair Barriers to Reach Goals: Cognitive deficits;Time post onset      Swallow Study   General Date of Onset: 06/18/15 HPI: Pt is a 86 y.o. male with medical history significant of multiple CVAs with residual effects, hypertension, diabetes, coronary artery disease, paroxysmal atrial fibrillation, BPH, ataxia, COPD, chronic kidney disease stage III who was brought in by his wife secondary to  weakness and fever. CXR of 4/15: Persistent diffuse BILATERAL interstitial infiltrates, which could represent pulmonary edema or atypical infection. WBC elevated and pt afebrile at time of eval. Type of Study: Bedside Swallow Evaluation Previous Swallow Assessment: None Diet Prior to this Study: Regular;Thin liquids Temperature Spikes Noted: No Respiratory Status: Nasal cannula History of Recent Intubation: No Behavior/Cognition: Alert;Cooperative;Pleasant mood;Confused;Requires cueing Oral Cavity Assessment: Within Functional Limits Oral Care Completed by SLP: No Oral Cavity - Dentition: Missing dentition(Limited maxillary dentition) Vision: Functional for self-feeding Self-Feeding Abilities: Able to feed self Patient Positioning: Upright in bed;Postural control adequate for testing Baseline Vocal Quality: Normal Volitional Cough: Strong Volitional Swallow: Able to elicit    Oral/Motor/Sensory Function Overall Oral Motor/Sensory Function: Within functional limits   Ice Chips Ice chips: Not tested   Thin Liquid Thin Liquid: Within functional limits Presentation: Cup;Straw    Nectar Thick Nectar Thick Liquid: Not tested   Honey Thick Honey Thick Liquid: Not tested   Puree Puree: Within functional limits Presentation: Spoon   Solid     Solid: Impaired Presentation: Spoon;Self Fed Oral Phase Impairments: Impaired mastication Oral Phase Functional Implications: Impaired mastication Pharyngeal Phase Impairments: Cough - Immediate;Cough - Delayed;Throat Clearing - Immediate;Throat Clearing - Delayed     Nichole Keltner I.  Hardin Negus, [place], [place] Office number [phone] Pager [phone]  Horton Marshall 06/18/2019,12:41 PM

## 2019-06-18 NOTE — Progress Notes (Addendum)
Pharmacy Antibiotic Note  Herbert Marquez is a 86 y.o. male admitted on 07/03/2019 with cellulitis.  Pharmacy has been consulted for vancomycin dosing.  He is on D4 of vanc/ceftriaxone for his cellulitis. Pt has an episode of shock last PM. All cultures remain neg. Will discuss with MD about switching vanc to zyvox due to vanc level reagent shortage. Ok to change vanc to zyvox per Dr. Reesa Chew  Scr 1.18 WBC 13.3  Plan: Dc vanc Zyvox 600mg  IV BID   Height: 5\' 7"  (170.2 cm) Weight: 67.2 kg (148 lb 2.4 oz) IBW/kg (Calculated) : 66.1  Temp (24hrs), Avg:98 F (36.7 C), Min:97.4 F (36.3 C), Max:99.1 F (37.3 C)  Recent Labs  Lab 06/20/2019 1852 06/18/2019 2134 06/16/19 0506 06/17/19 0516 06/18/19 0558  WBC 17.6*  --  12.4* 15.0* 13.3*  CREATININE 1.16  --  1.15 0.92 1.18  LATICACIDVEN 2.1* 2.0* 0.9  --   --     Estimated Creatinine Clearance: 39.7 mL/min (by C-G formula based on SCr of 1.18 mg/dL).    Onnie Boer, PharmD, BCIDP, AAHIVP, CPP Infectious Disease Pharmacist 06/18/2019 10:39 AM

## 2019-06-18 NOTE — Progress Notes (Signed)
Echo at bedside. Will medicate once procedure is complete. Pt has remained sleeping since consuming his first meal today after speech eval.

## 2019-06-19 ENCOUNTER — Inpatient Hospital Stay (HOSPITAL_COMMUNITY): Payer: Medicare Other

## 2019-06-19 DIAGNOSIS — F0390 Unspecified dementia without behavioral disturbance: Secondary | ICD-10-CM

## 2019-06-19 LAB — BASIC METABOLIC PANEL
Anion gap: 9 (ref 5–15)
BUN: 19 mg/dL (ref 8–23)
CO2: 22 mmol/L (ref 22–32)
Calcium: 8 mg/dL — ABNORMAL LOW (ref 8.9–10.3)
Chloride: 107 mmol/L (ref 98–111)
Creatinine, Ser: 0.97 mg/dL (ref 0.61–1.24)
GFR calc Af Amer: >60 mL/min (ref >60)
GFR calc non Af Amer: >60 mL/min (ref >60)
Glucose, Bld: 111 mg/dL — ABNORMAL HIGH (ref 70–99)
Potassium: 3.3 mmol/L — ABNORMAL LOW (ref 3.5–5.1)
Sodium: 138 mmol/L (ref 135–145)

## 2019-06-19 LAB — CBC
HCT: 33.5 % — ABNORMAL LOW (ref 39.0–52.0)
Hemoglobin: 10.9 g/dL — ABNORMAL LOW (ref 13.0–17.0)
MCH: 30.7 pg (ref 26.0–34.0)
MCHC: 32.5 g/dL (ref 30.0–36.0)
MCV: 94.4 fL (ref 80.0–100.0)
Platelets: 342 10*3/uL (ref 150–400)
RBC: 3.55 MIL/uL — ABNORMAL LOW (ref 4.22–5.81)
RDW: 14.6 % (ref 11.5–15.5)
WBC: 14.9 10*3/uL — ABNORMAL HIGH (ref 4.0–10.5)
nRBC: 0 % (ref 0.0–0.2)

## 2019-06-19 LAB — MAGNESIUM: Magnesium: 1.8 mg/dL (ref 1.7–2.4)

## 2019-06-19 MED ORDER — LINEZOLID 600 MG PO TABS: 600.0000 mg | ORAL_TABLET | Freq: Two times a day (BID) | ORAL | Status: DC

## 2019-06-19 MED ORDER — BUDESONIDE 0.5 MG/2ML IN SUSP
0.5000 mg | Freq: Two times a day (BID) | RESPIRATORY_TRACT | Status: DC
  Administered 2019-06-19 – 2019-06-22 (×7): 0.5 mg via RESPIRATORY_TRACT
  Filled 2019-06-19 (×7): qty 2

## 2019-06-19 MED ORDER — DOXYCYCLINE HYCLATE 100 MG PO TABS
100.0000 mg | ORAL_TABLET | Freq: Two times a day (BID) | ORAL | Status: DC
  Administered 2019-06-19 – 2019-06-21 (×4): 100 mg via ORAL
  Filled 2019-06-19 (×4): qty 1

## 2019-06-19 MED ORDER — CEPHALEXIN 250 MG PO CAPS
500.0000 mg | ORAL_CAPSULE | Freq: Four times a day (QID) | ORAL | Status: DC
  Administered 2019-06-19 – 2019-06-21 (×7): 500 mg via ORAL
  Filled 2019-06-19 (×8): qty 2

## 2019-06-19 MED ORDER — METHYLPREDNISOLONE SODIUM SUCC 40 MG IJ SOLR
40.0000 mg | Freq: Three times a day (TID) | INTRAMUSCULAR | Status: DC
  Administered 2019-06-19 – 2019-06-21 (×7): 40 mg via INTRAVENOUS
  Filled 2019-06-19 (×7): qty 1

## 2019-06-19 MED ORDER — POTASSIUM CHLORIDE 20 MEQ PO PACK
40.0000 meq | PACK | Freq: Once | ORAL | Status: AC
  Administered 2019-06-19: 40 meq via ORAL
  Filled 2019-06-19 (×3): qty 2

## 2019-06-19 NOTE — Progress Notes (Signed)
PHARMACIST - PHYSICIAN COMMUNICATION DR:   Reesa Chew CONCERNING: Antibiotic IV to Oral Route Change Policy  RECOMMENDATION: This patient is receiving linezolid by the intravenous route.  Based on criteria approved by the Pharmacy and Therapeutics Committee, the antibiotic(s) is/are being converted to the equivalent oral dose form(s).   DESCRIPTION: These criteria include:  Patient being treated for a respiratory tract infection, urinary tract infection, cellulitis or clostridium difficile associated diarrhea if on metronidazole  The patient is not neutropenic and does not exhibit a GI malabsorption state  The patient is eating (either orally or via tube) and/or has been taking other orally administered medications for a least 24 hours  The patient is improving clinically and has a Tmax < 100.5  If you have questions about this conversion, please contact the Pharmacy Department  []   [phone] )  [facility] []   [phone] )  [facility] [x]   [phone] )  [facility] []   [phone] )  [facility] []   [phone] )  [facility]

## 2019-06-19 NOTE — Progress Notes (Signed)
PROGRESS NOTE    Herbert Marquez  [id number] DOB: 18-Jul-1927 DOA: 06/09/2019 PCP: No primary care provider on file.   Brief Narrative:  86 year old with history of multiple CVAs with residual effect, HTN, DM2, CAD, paroxysmal A. fib, BPH, ataxia, COPD, CKD stage III AAA presented to the hospital with complaints of weakness and fever.  Patient was diagnosed with septic shock secondary to possible urinary tract infection or right lower extremity cellulitis.  Started on IV Rocephin and vancomycin. Intermittently patient has become progressively hypoxic and hypotensive. Initially required diuretics and then some fluid bolus.  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  Still has intermittent hypotension, therefore unable to give diuretics today. -No bacteria seen on UA. -Cx= NGTD -Change Zyvox and Rocephin to oral doxycycline and Keflex for 5 more days. -IV fluids discontinued. Intermittent fluid bolus as needed -Supportive care  Acute hypoxic respiratory failure secondary to fluid overload Pulmonary edema  Possible Underlying COPD exac; Hx of COPD -7 L high flow nasal cannula, was able to wean it  down to 5 L high flow while I was in the room. -Solu-Medrol 40 mg IV every 8 hours -Bronchodilators 3 times daily, incentive spirometer, Pulmicort twice daily -BNP Mildly elevated.  -Procalcitonin 0.19 -COVID-19-negative -Echocardiogram - ef 60%, G1DD. Elevated PASP -Low suspicion for PE as patient is already on Eliquis -S&S eval= Dys I Diet.  Modified barium swallow  Delirium, intermittent -No focal neuro deficits but having moments of confusion and agitation.  CKD stage IIIa -Creatinine around baseline.  History of CVA -On aspirin, statin, Eliquis  GERD -PPI  Paroxysmal atrial fibrillation -Paced rhythm.  Continue Eliquis  History of seizures -Keppra 750 mg twice daily  Prolonged QTC -Continue Seroquel.  QTC is improved, 503.  Constipation -Bowel regimen in place.  Out of bed to chair   DVT prophylaxis: Eliquis Code Status: DNR Family Communication: Wife at bedside Disposition Plan:   Patient From= home  Patient Anticipated D/C place= to be determined  Barriers= patient is still quite ill requiring significant amount of supplemental oxygen-10 L high flow nasal cannula. Unsafe for discharge.  Subjective: While I was in the room patient was awake alert oriented did not have any complaints besides exertional shortness of breath.  Still on 10 L high flow nasal cannula, I was able to wean her down to 5 L high flow nasal cannula he was saturating greater than 95%.  Review of Systems Otherwise negative except as per HPI, including: General: Denies fever, chills, night sweats or unintended weight loss. Resp: Denies hemoptysis Cardiac: Denies chest pain, palpitations, orthopnea, paroxysmal nocturnal dyspnea. GI: Denies abdominal pain, nausea, vomiting, diarrhea or constipation GU: Denies dysuria, frequency, hesitancy or incontinence MS: Denies muscle aches,  joint pain or swelling Neuro: Denies headache, neurologic deficits (focal weakness, numbness, tingling), abnormal  gait Psych: Denies anxiety, depression, SI/HI/AVH Skin: Denies new rashes or lesions ID: Denies sick contacts, exotic exposures, travel   Examination: Constitutional: Elderly frail, on 5 L high flow nasal cannula, chronically ill-appearing Respiratory: Diffuse bilateral rhonchorous breath sounds Cardiovascular: Normal sinus rhythm, no rubs Abdomen: Nontender nondistended good bowel sounds Musculoskeletal: No edema noted Skin: No rashes seen Neurologic: CN 2-12 grossly intact.  And nonfocal Psychiatric: Poor judgment and insight. Alert and oriented x 3. Normal mood.  Objective: Vitals:   06/19/19 0500 06/19/19 0600 06/19/19 0615 06/19/19 0700  BP: (!) 91/52 (!) 117/95  110/62  Pulse: 65 76  78  Resp: (!) 25 (!) 21  (!) 21  Temp: 98.7 F (37.1 C)     TempSrc:      SpO2: 98% (!) 87% 94% 95%  Weight: 71.5 kg     Height:        Intake/Output Summary (Last 24 hours) at 06/19/2019 0759 Last data filed at 06/19/2019 0600 Gross per 24 hour  Intake 520 ml  Output --  Net 520 ml   Filed Weights   06/17/19 0450 06/18/19 0010 06/19/19 0500  Weight: 72.1 kg 67.2 kg 71.5 kg     Data Reviewed:   CBC: Recent Labs  Lab 06/14/2019 1852 06/16/19 0506 06/17/19 0516 06/18/19 0558 06/19/19 0428  WBC 17.6* 12.4* 15.0* 13.3* 14.9*  NEUTROABS 14.9*  --   --   --   --   HGB 15.3 12.4* 13.6 12.2* 10.9*  HCT 45.9 37.8* 43.0 37.7* 33.5*  MCV 92.2 94.0 95.8 95.7 94.4  PLT 404* 321 345 334 [id number]   Basic Metabolic Panel: Recent Labs  Lab 06/29/2019 1852 06/16/19 0506 06/17/19 0516 06/18/19 0558 06/19/19 0428  NA 132* 138 133* 135 138  K 4.3 4.3 3.8 3.8 3.3*  CL 100 108 104 104 107  CO2 21* 24 18* 21* 22  GLUCOSE 128* 96 91 137* 111*  BUN 26* 18 14 17 19   CREATININE 1.16 1.15 0.92 1.18 0.97  CALCIUM 8.9 8.1* 8.0* 7.6* 8.0*  MG  --   --  3.8* 1.8 1.8   GFR: Estimated Creatinine Clearance: 48.3 mL/min (by C-G formula based on SCr of 0.97 mg/dL). Liver Function Tests: Recent Labs   Lab 06/13/2019 1852 06/16/19 0506  AST 53* 44*  ALT 19 13  ALKPHOS 73 58  BILITOT 0.6 1.1  PROT 6.6 4.9*  ALBUMIN 3.4* 2.4*   No results for input(s): LIPASE, AMYLASE in the last 168 hours. No results for input(s): AMMONIA in the last 168 hours. Coagulation Profile: Recent Labs  Lab 06/30/2019 1900 06/16/19 0506  INR 1.5* 1.7*   Cardiac Enzymes: No results for input(s): CKTOTAL, CKMB, CKMBINDEX, TROPONINI in the last 168 hours. BNP (last 3 results) No results for input(s): PROBNP in the last 8760 hours. HbA1C: No results for input(s): HGBA1C in the last 72 hours. CBG: No results for input(s): GLUCAP in the last 168 hours. Lipid Profile: No results for input(s): CHOL, HDL, LDLCALC, TRIG, CHOLHDL, LDLDIRECT in the last 72 hours. Thyroid Function Tests: No results for input(s): TSH, T4TOTAL, FREET4, T3FREE, THYROIDAB in the last 72 hours. Anemia Panel: No results for input(s): VITAMINB12, FOLATE, FERRITIN, TIBC, IRON, RETICCTPCT in the last 72 hours. Sepsis Labs: Recent Labs  Lab 06/29/2019 1852 06/19/2019 2134 06/16/19 0506  PROCALCITON  --   --  0.19  LATICACIDVEN 2.1* 2.0* 0.9  Recent Results (from the past 240 hour(s))  Culture, blood (routine x 2)     Status: None (Preliminary result)   Collection Time: 06/14/2019  6:45 PM   Specimen: BLOOD  Result Value Ref Range Status   Specimen Description BLOOD BLOOD RIGHT WRIST  Final   Special Requests   Final    BOTTLES DRAWN AEROBIC AND ANAEROBIC Blood Culture adequate volume   Culture   Final    NO GROWTH 3 DAYS Performed at [facility], [address] [place], [place] [address]    Report Status PENDING  Incomplete  Culture, blood (routine x 2)     Status: None (Preliminary result)   Collection Time: 06/18/2019  6:45 PM   Specimen: BLOOD  Result Value Ref Range Status   Specimen Description BLOOD RIGHT UPPER ARM  Final   Special Requests   Final    BOTTLES DRAWN AEROBIC ONLY Blood Culture results may not be  optimal due to an inadequate volume of blood received in culture bottles   Culture   Final    NO GROWTH 3 DAYS Performed at [facility], [place] [address], [place], [place] [address]    Report Status PENDING  Incomplete  Respiratory Panel by RT PCR (Flu A&B, Covid) - Nasopharyngeal Swab     Status: None   Collection Time: 07/01/2019  7:34 PM   Specimen: Nasopharyngeal Swab  Result Value Ref Range Status   SARS Coronavirus 2 by RT PCR NEGATIVE NEGATIVE Final    Comment: (NOTE) SARS-CoV-2 target nucleic acids are NOT DETECTED. The SARS-CoV-2 RNA is generally detectable in upper respiratoy specimens during the acute phase of infection. The lowest concentration of SARS-CoV-2 viral copies this assay can detect is 131 copies/mL. A negative result does not preclude SARS-Cov-2 infection and should not be used as the sole basis for treatment or other patient management decisions. A negative result may occur with  improper specimen collection/handling, submission of specimen other than nasopharyngeal swab, presence of viral mutation(s) within the areas targeted by this assay, and inadequate number of viral copies (<131 copies/mL). A negative result must be combined with clinical observations, patient history, and epidemiological information. The expected result is Negative. Fact Sheet for Patients:  PinkCheek.be Fact Sheet for Healthcare Providers:  GravelBags.it This test is not yet ap proved or cleared by the [place] FDA and  has been authorized for detection and/or diagnosis of SARS-CoV-2 by FDA under an Emergency Use Authorization (EUA). This EUA will remain  in effect (meaning this test can be used) for the duration of the COVID-19 declaration under Section 564(b)(1) of the Act, 21 U.S.C. section 360bbb-3(b)(1), unless the authorization is terminated or revoked sooner.    Influenza A by PCR NEGATIVE NEGATIVE Final    Influenza B by PCR NEGATIVE NEGATIVE Final    Comment: (NOTE) The Xpert Xpress SARS-CoV-2/FLU/RSV assay is intended as an aid in  the diagnosis of influenza from Nasopharyngeal swab specimens and  should not be used as a sole basis for treatment. Nasal washings and  aspirates are unacceptable for Xpert Xpress SARS-CoV-2/FLU/RSV  testing. Fact Sheet for Patients: PinkCheek.be Fact Sheet for Healthcare Providers: GravelBags.it This test is not yet approved or cleared by the [place] FDA and  has been authorized for detection and/or diagnosis of SARS-CoV-2 by  FDA under an Emergency Use Authorization (EUA). This EUA will remain  in effect (meaning this test can be used) for the duration of the  Covid-19 declaration under  Section 564(b)(1) of the Act, 21  U.S.C. section 360bbb-3(b)(1), unless the authorization is  terminated or revoked. Performed at [facility], [place] [address] [place], [place] [address]   Urine culture     Status: Abnormal   Collection Time: 06/17/2019  7:50 PM   Specimen: Urine, Catheterized  Result Value Ref Range Status   Specimen Description URINE, CATHETERIZED  Final   Special Requests   Final    NONE Performed at [facility], [place] [address] [place], [place] [address]    Culture MULTIPLE SPECIES PRESENT, SUGGEST RECOLLECTION (A)  Final   Report Status 06/17/2019 FINAL  Final  MRSA PCR Screening     Status: None   Collection Time: 06/16/19 12:04 AM   Specimen: Nasal Mucosa; Nasopharyngeal  Result Value Ref Range Status   MRSA by PCR NEGATIVE NEGATIVE Final    Comment:        The GeneXpert MRSA Assay (FDA approved for NASAL specimens only), is one component of a comprehensive MRSA colonization surveillance program. It is not intended to diagnose MRSA infection nor to guide or monitor treatment for MRSA infections. Performed at [facility], [place] [address] [place],  [place] [address]   Culture, blood (Routine X 2) w Reflex to ID Panel     Status: None (Preliminary result)   Collection Time: 06/16/19  6:20 PM   Specimen: BLOOD  Result Value Ref Range Status   Specimen Description BLOOD LEFT ANTECUBITAL  Final   Special Requests   Final    BOTTLES DRAWN AEROBIC ONLY Blood Culture results may not be optimal due to an inadequate volume of blood received in culture bottles   Culture   Final    NO GROWTH 2 DAYS Performed at [facility], [place] [address], [place], [place] [address]    Report Status PENDING  Incomplete  Culture, blood (Routine X 2) w Reflex to ID Panel     Status: None (Preliminary result)   Collection Time: 06/16/19  6:26 PM   Specimen: BLOOD LEFT HAND  Result Value Ref Range Status   Specimen Description BLOOD LEFT HAND  Final   Special Requests   Final    BOTTLES DRAWN AEROBIC AND ANAEROBIC Blood Culture adequate volume   Culture   Final    NO GROWTH 2 DAYS Performed at [facility], [place] [address] [place], [place] [address]    Report Status PENDING  Incomplete         Radiology Studies: DG Chest Port 1 View  Result Date: 06/18/2019 CLINICAL DATA:  Dyspnea, history stroke, COPD, hypertension, coronary artery disease, dementia, paroxysmal atrial fibrillation, type II diabetes mellitus EXAM: PORTABLE CHEST 1 VIEW COMPARISON:  Portable exam 0828 hours compared to 06/17/2019 FINDINGS: Enlargement of cardiac silhouette with stable LEFT subclavian pacemaker leads. Atherosclerotic calcification aorta. Diffuse interstitial infiltrates throughout both lungs similar to prior exam. When compared to earlier studies appears represent a combination of underlying chronic interstitial lung disease/fibrosis and superimposed acute interstitial infiltrate. No pleural effusion or pneumothorax. Osseous demineralization. IMPRESSION: Persistent diffuse BILATERAL interstitial infiltrates, which could represent pulmonary edema or atypical infection.  Electronically Signed   By: Lavonia Dana M.D.   On: 06/18/2019 08:39   ECHOCARDIOGRAM COMPLETE  Result Date: 06/18/2019    ECHOCARDIOGRAM REPORT   Patient Name:   Herbert Marquez Date of Exam: 06/18/2019 Medical Rec #:  [id number]       Height:  67.0 in Accession #:    [id number]      Weight:       148.1 lb Date of Birth:  04-23-1927       BSA:          1.780 m Patient Age:    54 years        BP:           124/72 mmHg Patient Gender: M               HR:           79 bpm. Exam Location:  Inpatient Procedure: 2D Echo Indications:    cardiomyopathy 425.9  History:        Patient has prior history of Echocardiogram examinations, most                 recent 10/06/2017. CAD, COPD and chronic kidney disease. history                 of strokes.; Risk Factors:Diabetes and Hypertension.  Sonographer:    Johny Chess Referring Phys: [id number] Krystol Rocco CHIRAG Ova Meegan IMPRESSIONS  1. Left ventricular ejection fraction, by estimation, is 60 to 65%. The left ventricle has normal function. The left ventricle has no regional wall motion abnormalities. Left ventricular diastolic parameters are consistent with Grade I diastolic dysfunction (impaired relaxation).  2. Right ventricular systolic function is normal. The right ventricular size is mildly enlarged. There is moderately elevated pulmonary artery systolic pressure. The estimated right ventricular systolic pressure is 123XX123 mmHg.  3. The mitral valve is normal in structure. Mild mitral valve regurgitation. No evidence of mitral stenosis.  4. Tricuspid valve regurgitation is moderate.  5. The aortic valve is normal in structure. Aortic valve regurgitation is not visualized. No aortic stenosis is present.  6. The inferior vena cava is normal in size with greater than 50% respiratory variability, suggesting right atrial pressure of 3 mmHg. FINDINGS  Left Ventricle: Left ventricular ejection fraction, by estimation, is 60 to 65%. The left ventricle has normal function. The left  ventricle has no regional wall motion abnormalities. The left ventricular internal cavity size was normal in size. There is  no left ventricular hypertrophy. Left ventricular diastolic parameters are consistent with Grade I diastolic dysfunction (impaired relaxation). Normal left ventricular filling pressure. Right Ventricle: The right ventricular size is mildly enlarged. No increase in right ventricular wall thickness. Right ventricular systolic function is normal. There is moderately elevated pulmonary artery systolic pressure. The tricuspid regurgitant velocity is 3.33 m/s, and with an assumed right atrial pressure of 3 mmHg, the estimated right ventricular systolic pressure is 123XX123 mmHg. Left Atrium: Left atrial size was normal in size. Right Atrium: Right atrial size was normal in size. Pericardium: There is no evidence of pericardial effusion. Mitral Valve: The mitral valve is normal in structure. Normal mobility of the mitral valve leaflets. Mild mitral valve regurgitation. No evidence of mitral valve stenosis. Tricuspid Valve: The tricuspid valve is normal in structure. Tricuspid valve regurgitation is moderate . No evidence of tricuspid stenosis. Aortic Valve: The aortic valve is normal in structure.. There is moderate thickening and moderate calcification of the aortic valve. Aortic valve regurgitation is not visualized. No aortic stenosis is present. There is moderate thickening of the aortic valve. There is moderate calcification of the aortic valve. Pulmonic Valve: The pulmonic valve was normal in structure. Pulmonic valve regurgitation is not visualized. No evidence of pulmonic stenosis. Aorta: The aortic root is normal in  size and structure. Venous: The inferior vena cava is normal in size with greater than 50% respiratory variability, suggesting right atrial pressure of 3 mmHg. IAS/Shunts: No atrial level shunt detected by color flow Doppler.  LEFT VENTRICLE PLAX 2D LVIDd:         4.90 cm  Diastology  LVIDs:         3.40 cm  LV e' lateral:   9.36 cm/s LV PW:         0.90 cm  LV E/e' lateral: 5.4 LV IVS:        0.70 cm  LV e' medial:    5.55 cm/s LVOT diam:     1.80 cm  LV E/e' medial:  9.1 LV SV:         43 LV SV Index:   24 LVOT Area:     2.54 cm  RIGHT VENTRICLE RV S prime:     10.30 cm/s TAPSE (M-mode): 1.5 cm LEFT ATRIUM             Index LA diam:        2.80 cm 1.57 cm/m LA Vol (A2C):   36.3 ml 20.39 ml/m LA Vol (A4C):   58.8 ml 33.03 ml/m LA Biplane Vol: 50.9 ml 28.59 ml/m  AORTIC VALVE LVOT Vmax:   85.70 cm/s LVOT Vmean:  53.600 cm/s LVOT VTI:    0.168 m  AORTA Ao Root diam: 3.50 cm MITRAL VALVE               TRICUSPID VALVE MV Area (PHT): 3.12 cm    TR Peak grad:   44.4 mmHg MV Decel Time: 243 msec    TR Vmax:        333.00 cm/s MV E velocity: 50.40 cm/s MV A velocity: 77.60 cm/s  SHUNTS MV E/A ratio:  0.65        Systemic VTI:  0.17 m                            Systemic Diam: 1.80 cm Ena Dawley MD Electronically signed by Ena Dawley MD Signature Date/Time: 06/18/2019/7:36:55 PM    Final         Scheduled Meds: . apixaban  5 mg Oral BID  . aspirin EC  81 mg Oral QHS  . atorvastatin  80 mg Oral QHS  . feeding supplement (ENSURE ENLIVE)  237 mL Oral Q lunch  . ipratropium-albuterol  3 mL Nebulization TID  . levETIRAcetam  750 mg Oral BID  . multivitamin with minerals  1 tablet Oral Q breakfast  . potassium chloride  40 mEq Oral Once  . QUEtiapine  25 mg Oral QHS   Continuous Infusions: . sodium chloride 250 mL (06/18/19 1514)  . cefTRIAXone (ROCEPHIN)  IV 2 g (06/18/19 2045)  . linezolid (ZYVOX) IV 600 mg (06/18/19 2200)  . sodium chloride Stopped (06/18/19 0325)     LOS: 4 days   Time spent= 40 mins    Sylina Henion Arsenio Loader, MD [facility]  If 7PM-7AM, please contact night-coverage  06/19/2019, 7:59 AM

## 2019-06-19 NOTE — Progress Notes (Signed)
Modified Barium Swallow Progress Note  Patient Details  Name: Herbert Marquez MRN: [id number] Date of Birth: 16-Jun-1927  Today's Date: 06/19/2019  Modified Barium Swallow completed.  Full report located under Chart Review in the Imaging Section.  Brief recommendations include the following:  Clinical Impression  Pt presents with likely acute on chronic oropahryngeal dysphagia considering the reports of the pt's wife. His swallow was marked by prolonged mastication, reduced bolus cohesion, impaired bolus formation, reduced lingual retraction, and a pharyngeal delay. He demonstrated lingual pumping, mild vallecular residue within normal range for age, and inconsistent penetration (PAS 3) of thin liquids. No instances of aspiration were noted but is likely with consecutive swallows. It is recommended that the pt's diet of dysphagia 1 (puree) solids with thin liquids be continued at this time with strict observance of swallowing precautiosn to reduce risk of laryngeal invasion and ultimate aspiration. SLP will follow continue to follow pt.    Swallow Evaluation Recommendations       SLP Diet Recommendations: Dysphagia 1 (Puree) solids;Thin liquid   Liquid Administration via: Cup;No straw   Medication Administration: Crushed with puree   Supervision: Staff to assist with self feeding;Full supervision/cueing for compensatory strategies   Compensations: Minimize environmental distractions;Slow rate;Small sips/bites;Follow solids with liquid   Postural Changes: Remain semi-upright after after feeds/meals (Comment);Seated upright at 90 degrees   Oral Care Recommendations: Oral care BID;Oral care before and after PO      Bailen Geffre I. Hardin Negus, [place], Advance Office number [phone] Pager [phone]  Horton Marshall 06/19/2019,9:47 AM

## 2019-06-20 LAB — CBC
HCT: 38.7 % — ABNORMAL LOW (ref 39.0–52.0)
Hemoglobin: 12.6 g/dL — ABNORMAL LOW (ref 13.0–17.0)
MCH: 30.4 pg (ref 26.0–34.0)
MCHC: 32.6 g/dL (ref 30.0–36.0)
MCV: 93.3 fL (ref 80.0–100.0)
Platelets: 417 10*3/uL — ABNORMAL HIGH (ref 150–400)
RBC: 4.15 MIL/uL — ABNORMAL LOW (ref 4.22–5.81)
RDW: 14.6 % (ref 11.5–15.5)
WBC: 16.7 10*3/uL — ABNORMAL HIGH (ref 4.0–10.5)
nRBC: 0 % (ref 0.0–0.2)

## 2019-06-20 LAB — CULTURE, BLOOD (ROUTINE X 2)
Culture: NO GROWTH
Culture: NO GROWTH
Special Requests: ADEQUATE

## 2019-06-20 LAB — BASIC METABOLIC PANEL
Anion gap: 12 (ref 5–15)
BUN: 19 mg/dL (ref 8–23)
CO2: 20 mmol/L — ABNORMAL LOW (ref 22–32)
Calcium: 8.8 mg/dL — ABNORMAL LOW (ref 8.9–10.3)
Chloride: 107 mmol/L (ref 98–111)
Creatinine, Ser: 1.04 mg/dL (ref 0.61–1.24)
GFR calc Af Amer: >60 mL/min (ref >60)
GFR calc non Af Amer: >60 mL/min (ref >60)
Glucose, Bld: 168 mg/dL — ABNORMAL HIGH (ref 70–99)
Potassium: 4.1 mmol/L (ref 3.5–5.1)
Sodium: 139 mmol/L (ref 135–145)

## 2019-06-20 LAB — GLUCOSE, CAPILLARY
Glucose-Capillary: 166 mg/dL — ABNORMAL HIGH (ref 70–99)
Glucose-Capillary: 143 mg/dL — ABNORMAL HIGH (ref 70–99)
Glucose-Capillary: 162 mg/dL — ABNORMAL HIGH (ref 70–99)
Glucose-Capillary: 169 mg/dL — ABNORMAL HIGH (ref 70–99)

## 2019-06-20 LAB — MAGNESIUM: Magnesium: 2.1 mg/dL (ref 1.7–2.4)

## 2019-06-20 MED ORDER — INSULIN ASPART 100 UNIT/ML ~~LOC~~ SOLN
0.0000 [IU] | Freq: Three times a day (TID) | SUBCUTANEOUS | Status: DC
  Administered 2019-06-20: 2 [IU] via SUBCUTANEOUS
  Administered 2019-06-20: 3 [IU] via SUBCUTANEOUS
  Administered 2019-06-21: 8 [IU] via SUBCUTANEOUS
  Administered 2019-06-21 (×2): 3 [IU] via SUBCUTANEOUS

## 2019-06-20 MED ORDER — FUROSEMIDE 10 MG/ML IJ SOLN
40.0000 mg | Freq: Once | INTRAMUSCULAR | Status: AC
  Administered 2019-06-20: 40 mg via INTRAVENOUS
  Filled 2019-06-20: qty 4

## 2019-06-20 MED ORDER — INSULIN ASPART 100 UNIT/ML ~~LOC~~ SOLN: 0.0000 [IU] | Freq: Every day | SUBCUTANEOUS | Status: DC

## 2019-06-20 NOTE — Progress Notes (Signed)
PROGRESS NOTE    Herbert Marquez  [id number] DOB: 08-07-1927 DOA: 06/06/2019 PCP: No primary care provider on file.   Brief Narrative:  87 year old with history of multiple CVAs with residual effect, HTN, DM2, CAD, paroxysmal A. fib, BPH, ataxia, COPD, CKD stage III AAA presented to the hospital with complaints of weakness and fever.  Patient was diagnosed with septic shock secondary to possible urinary tract infection or right lower extremity cellulitis.  Started on IV Rocephin and vancomycin. Intermittently patient has become progressively hypoxic and hypotensive. Initially required diuretics and then some fluid bolus.  Seen by speech and swallow recommended dysphagia 1 diet.  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  Still has intermittent hypotension, therefore unable to give diuretics today. -No bacteria seen on UA. -Cx= NGTD -Continue doxycycline and Keflex.  Last day 2019/07/09 -IV fluids discontinued. Intermittent fluid bolus as needed -Supportive care  Acute hypoxic respiratory failure secondary to fluid overload Pulmonary edema  Possible Underlying COPD exac; Hx of COPD -Continue  supplemental oxygen as needed, requiring 5-9 L high flow nasal cannula -Solu-Medrol 40 mg IV every 8 hours, add Accu-Chek and sliding scale -Bronchodilators 3 times daily, incentive spirometer, Pulmicort twice daily -BNP Mildly elevated.  Lasix 40 mg IV today.  Very difficult to diurese him because of intermittent episode of hypotension -Procalcitonin 0.19 -COVID-19-negative -Echocardiogram - ef 60%, G1DD. Elevated PASP -Low suspicion for PE as patient is already on Eliquis -S&S eval= Dys I Diet.  Modified barium swallow  Delirium, intermittent -No focal neuro deficits but having moments of confusion and agitation.  CKD stage IIIa -Creatinine around baseline.  History of CVA -On aspirin, statin, Eliquis  GERD -PPI  Paroxysmal atrial fibrillation -Paced rhythm.  Continue Eliquis  History of seizures -Keppra 750 mg twice daily  Prolonged QTC -Continue Seroquel.  QTC is improved, 503.  Constipation -Bowel regimen in place.  Out of bed to chair  He will need PT evaluation once his respiration symptoms are better  DVT prophylaxis: Eliquis Code Status: DNR Family Communication:  Disposition Plan:   Patient From= home  Patient Anticipated D/C place= to be determined  Barriers= still quite hypoxic requiring high flow.  Unsafe for discharge.  Subjective: Again patient was delirious overnight and did not get much rest but this morning his mentation is back to normal according to his wife was present at bedside.  No complaints overall feels okay.  Still requiring 7 L of high flow nasal cannula  Review of Systems Otherwise negative except as per HPI, including: General: Denies fever, chills, night sweats or unintended weight loss. Resp: Denies cough, wheezing, shortness of breath. Cardiac: Denies chest pain, palpitations, orthopnea, paroxysmal nocturnal dyspnea. GI: Denies abdominal pain, nausea, vomiting,  diarrhea or constipation GU: Denies dysuria, frequency, hesitancy or  incontinence MS: Denies muscle aches, joint pain or swelling Neuro: Denies headache, neurologic deficits (focal weakness, numbness, tingling), abnormal gait Psych: Denies anxiety, depression, SI/HI/AVH Skin: Denies new rashes or lesions ID: Denies sick contacts, exotic exposures, travel  Examination: Constitutional: On 7 L high flow nasal cannula, appears chronically ill and frail Respiratory: Bilateral diffuse rhonchi with basilar crackles Cardiovascular: Normal sinus rhythm, no rubs Abdomen: Nontender nondistended good bowel sounds Musculoskeletal: No edema noted Skin: No rashes seen Neurologic: CN 2-12 grossly intact.  And nonfocal Psychiatric: Poor judgment and insight.  Alert to name and place  Objective: Vitals:   06/20/19 0036 06/20/19 0038 06/20/19 0400 06/20/19 0500  BP:   109/60   Pulse:   71   Resp:   (!) 24   Temp: 98.5 F (36.9 C)   97.6 F (36.4 C)  TempSrc: Oral     SpO2:   (!) 85%   Weight:  72.8 kg    Height:        Intake/Output Summary (Last 24 hours) at 06/20/2019 0736 Last data filed at 06/20/2019 0514 Gross per 24 hour  Intake 246.57 ml  Output 700 ml  Net -453.43 ml   Filed Weights   06/18/19 0010 06/19/19 0500 06/20/19 0038  Weight: 67.2 kg 71.5 kg 72.8 kg     Data Reviewed:   CBC: Recent Labs  Lab 06/21/2019 1852 06/26/2019 1852 06/16/19 0506 06/17/19 0516 06/18/19 0558 06/19/19 0428 06/20/19 0443  WBC 17.6*   < > 12.4* 15.0* 13.3* 14.9* 16.7*  NEUTROABS 14.9*  --   --   --   --   --   --   HGB 15.3   < > 12.4* 13.6 12.2* 10.9* 12.6*  HCT 45.9   < > 37.8* 43.0 37.7* 33.5* 38.7*  MCV 92.2   < > 94.0 95.8 95.7 94.4 93.3  PLT 404*   < > 321 345 334 342 417*   < > = values in this interval not displayed.   Basic Metabolic Panel: Recent Labs  Lab 06/16/19 0506 06/17/19 0516 06/18/19 0558 06/19/19 0428 06/20/19 0443  NA 138 133* 135 138 139  K 4.3 3.8 3.8 3.3* 4.1  CL 108 104 104 107 107  CO2 24 18* 21* 22 20*  GLUCOSE 96 91  137* 111* 168*  BUN 18 14 17 19 19   CREATININE 1.15 0.92 1.18 0.97 1.04  CALCIUM 8.1* 8.0* 7.6* 8.0* 8.8*  MG  --  3.8* 1.8 1.8 2.1   GFR: Estimated Creatinine Clearance: 45 mL/min (by C-G formula based on SCr of 1.04 mg/dL). Liver Function Tests: Recent Labs  Lab 06/24/2019 1852 06/16/19 0506  AST 53* 44*  ALT 19 13  ALKPHOS 73 58  BILITOT 0.6 1.1  PROT 6.6 4.9*  ALBUMIN 3.4* 2.4*   No results for input(s): LIPASE, AMYLASE in the last 168 hours. No results for input(s): AMMONIA in the last 168 hours. Coagulation Profile: Recent Labs  Lab 06/10/2019 1900 06/16/19 0506  INR 1.5* 1.7*   Cardiac Enzymes: No results for input(s): CKTOTAL, CKMB, CKMBINDEX, TROPONINI in the last 168 hours. BNP (last 3 results) No results for input(s): PROBNP in the last 8760 hours. HbA1C: No results for input(s): HGBA1C in the last 72 hours. CBG: No results for input(s): GLUCAP in the last 168 hours. Lipid Profile: No results for input(s): CHOL, HDL, LDLCALC, TRIG, CHOLHDL, LDLDIRECT in the last 72 hours. Thyroid Function Tests: No results  for input(s): TSH, T4TOTAL, FREET4, T3FREE, THYROIDAB in the last 72 hours. Anemia Panel: No results for input(s): VITAMINB12, FOLATE, FERRITIN, TIBC, IRON, RETICCTPCT in the last 72 hours. Sepsis Labs: Recent Labs  Lab 06/04/2019 1852 06/10/2019 2134 06/16/19 0506  PROCALCITON  --   --  0.19  LATICACIDVEN 2.1* 2.0* 0.9    Recent Results (from the past 240 hour(s))  Culture, blood (routine x 2)     Status: None (Preliminary result)   Collection Time: 06/17/2019  6:45 PM   Specimen: BLOOD  Result Value Ref Range Status   Specimen Description BLOOD BLOOD RIGHT WRIST  Final   Special Requests   Final    BOTTLES DRAWN AEROBIC AND ANAEROBIC Blood Culture adequate volume   Culture   Final    NO GROWTH 4 DAYS Performed at [facility], [place] [address], [place], [place] [address]    Report Status PENDING  Incomplete  Culture, blood (routine x 2)      Status: None (Preliminary result)   Collection Time: 06/16/2019  6:45 PM   Specimen: BLOOD  Result Value Ref Range Status   Specimen Description BLOOD RIGHT UPPER ARM  Final   Special Requests   Final    BOTTLES DRAWN AEROBIC ONLY Blood Culture results may not be optimal due to an inadequate volume of blood received in culture bottles   Culture   Final    NO GROWTH 4 DAYS Performed at [facility], [place] [address], [place], [place] [address]    Report Status PENDING  Incomplete  Respiratory Panel by RT PCR (Flu A&B, Covid) - Nasopharyngeal Swab     Status: None   Collection Time: 06/06/2019  7:34 PM   Specimen: Nasopharyngeal Swab  Result Value Ref Range Status   SARS Coronavirus 2 by RT PCR NEGATIVE NEGATIVE Final    Comment: (NOTE) SARS-CoV-2 target nucleic acids are NOT DETECTED. The SARS-CoV-2 RNA is generally detectable in upper respiratoy specimens during the acute phase of infection. The lowest concentration of SARS-CoV-2 viral copies this assay can detect is 131 copies/mL. A negative result does not preclude SARS-Cov-2 infection and should not be used as the sole basis for treatment or other patient management decisions. A negative result may occur with  improper specimen collection/handling, submission of specimen other than nasopharyngeal swab, presence of viral mutation(s) within the areas targeted by this assay, and inadequate number of viral copies (<131 copies/mL). A negative result must be combined with clinical observations, patient history, and epidemiological information. The expected result is Negative. Fact Sheet for Patients:  PinkCheek.be Fact Sheet for Healthcare Providers:  GravelBags.it This test is not yet ap proved or cleared by the [place] FDA and  has been authorized for detection and/or diagnosis of SARS-CoV-2 by FDA under an Emergency Use Authorization (EUA). This EUA will remain  in  effect (meaning this test can be used) for the duration of the COVID-19 declaration under Section 564(b)(1) of the Act, 21 U.S.C. section 360bbb-3(b)(1), unless the authorization is terminated or revoked sooner.    Influenza A by PCR NEGATIVE NEGATIVE Final   Influenza B by PCR NEGATIVE NEGATIVE Final    Comment: (NOTE) The Xpert Xpress SARS-CoV-2/FLU/RSV assay is intended as an aid in  the diagnosis of influenza from Nasopharyngeal swab specimens and  should not be used as a sole basis for treatment. Nasal washings and  aspirates are unacceptable for Xpert Xpress SARS-CoV-2/FLU/RSV  testing. Fact Sheet for Patients: PinkCheek.be Fact Sheet for  Healthcare Providers: GravelBags.it This test is not yet approved or cleared by the [place] and  has been authorized for detection and/or diagnosis of SARS-CoV-2 by  FDA under an Emergency Use Authorization (EUA). This EUA will remain  in effect (meaning this test can be used) for the duration of the  Covid-19 declaration under Section 564(b)(1) of the Act, 21  U.S.C. section 360bbb-3(b)(1), unless the authorization is  terminated or revoked. Performed at [facility], [place] [address], [place], [place] [address]   Urine culture     Status: Abnormal   Collection Time: 06/06/2019  7:50 PM   Specimen: Urine, Catheterized  Result Value Ref Range Status   Specimen Description URINE, CATHETERIZED  Final   Special Requests   Final    NONE Performed at [facility], [place] [address], [place], [place] [address]    Culture MULTIPLE SPECIES PRESENT, SUGGEST RECOLLECTION (A)  Final   Report Status 06/17/2019 FINAL  Final  MRSA PCR Screening     Status: None   Collection Time: 06/16/19 12:04 AM   Specimen: Nasal Mucosa; Nasopharyngeal  Result Value Ref Range Status   MRSA by PCR NEGATIVE NEGATIVE Final    Comment:        The GeneXpert MRSA Assay (FDA approved for NASAL  specimens only), is one component of a comprehensive MRSA colonization surveillance program. It is not intended to diagnose MRSA infection nor to guide or monitor treatment for MRSA infections. Performed at [facility], [place] [address], [place], [place] [address]   Culture, blood (Routine X 2) w Reflex to ID Panel     Status: None (Preliminary result)   Collection Time: 06/16/19  6:20 PM   Specimen: BLOOD  Result Value Ref Range Status   Specimen Description BLOOD LEFT ANTECUBITAL  Final   Special Requests   Final    BOTTLES DRAWN AEROBIC ONLY Blood Culture results may not be optimal due to an inadequate volume of blood received in culture bottles   Culture   Final    NO GROWTH 3 DAYS Performed at [facility], [place] [address] [place], [place] [address]    Report Status PENDING  Incomplete  Culture, blood (Routine X 2) w Reflex to ID Panel     Status: None (Preliminary result)   Collection Time: 06/16/19  6:26 PM   Specimen: BLOOD LEFT HAND  Result Value Ref Range Status   Specimen Description BLOOD LEFT HAND  Final   Special Requests   Final    BOTTLES DRAWN AEROBIC AND ANAEROBIC Blood Culture adequate volume   Culture   Final    NO GROWTH 3 DAYS Performed at [facility], [place] [address] [place], [place] [address]    Report Status PENDING  Incomplete         Radiology Studies: DG Chest Port 1 View  Result Date: 06/18/2019 CLINICAL DATA:  Dyspnea, history stroke, COPD, hypertension, coronary artery disease, dementia, paroxysmal atrial fibrillation, type II diabetes mellitus EXAM: PORTABLE CHEST 1 VIEW COMPARISON:  Portable exam 0828 hours compared to 06/17/2019 FINDINGS: Enlargement of cardiac silhouette with stable LEFT subclavian pacemaker leads. Atherosclerotic calcification aorta. Diffuse interstitial infiltrates throughout both lungs similar to prior exam. When compared to earlier studies appears represent a combination of underlying chronic  interstitial lung disease/fibrosis and superimposed acute interstitial infiltrate. No pleural effusion or pneumothorax. Osseous demineralization. IMPRESSION: Persistent diffuse BILATERAL interstitial infiltrates, which could represent pulmonary edema or atypical  infection. Electronically Signed   By: Lavonia Dana M.D.   On: 06/18/2019 08:39   DG Swallowing Func-Speech Pathology  Result Date: 06/19/2019 = Objective Swallowing Evaluation: Type of Study: MBS-Modified Barium Swallow Study  Patient Details Name: JEREMAIH TILLEY MRN: [id number] Date of Birth: 11/17/1927 Today's Date: 06/19/2019 Time: SLP Start Time (ACUTE ONLY): [id number] -SLP Stop Time (ACUTE ONLY): 0852 SLP Time Calculation (min) (ACUTE ONLY): 14 min Past Medical History: Past Medical History: Diagnosis Date . Acute blood loss anemia  . Acute encephalopathy 06/04/2016 . Acute ischemic stroke ([place]) - L temporal lobe s/p tPA 03/30/2016 . Altered mental status  . Arthritis   "pretty much all over"  . 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 Atrial fibrillation with rapid ventricular response ([place])  . Basal cell carcinoma   "several burned off his body, face, head" . Benign essential HTN  . BPH (benign prostatic hypertrophy)  . Cerebral hemorrhage (HCC) w/ SDH s/p IV tPA  . Cerebral thrombosis with cerebral infarction 10/05/2017 . Cerebrovascular accident (CVA) ([place]) 09/18/2015 . Chronic anticoagulation  . CKD (chronic kidney disease), stage II 11/25/2015 . COPD (chronic obstructive pulmonary disease) ([place]) 03/09/2011 . Coronary artery disease   a. s/p PCI of RCA in 2006 . Coronary artery disease involving native coronary artery of native heart without angina pectoris  . CVA (cerebral infarction)   a. 06/2015: left thalamic and bilateral PCA . Dementia without behavioral disturbance ([place])  . Dysphagia as late effect of cerebrovascular disease  . Embolic stroke ([place]) [id number] . Expressive aphasia 10/04/2017 . Gait disturbance, post-stroke 06/23/2015 . GERD (gastroesophageal  reflux disease)  . Hemiparesis and other late effects of cerebrovascular accident ([place]) 06/29/2016 . History of stroke 04/04/2016 . HLD (hyperlipidemia)  . Hyperlipidemia  . Hypertension  . Ischemic stroke ([place])  . Low blood pressure reading 10/14/2017 . Orthostatic hypotension  . PAF (paroxysmal atrial fibrillation) ([place])  . Presence of permanent cardiac pacemaker  . Right hemiparesis ([place])  . Second degree Mobitz II AV block 10/23/2016 . Seizures ([place])  . Stroke ([place])  . Thalamic infarction ([place]) 06/21/2015 . TIA (transient ischemic attack)   Approximately 6 weeks post-cardiac catheterization.  . Type 2 diabetes mellitus with complication, without long-term current use of insulin ([place])  . Vascular dementia with behavior disturbance ([place]) 06/29/2016 Past Surgical History: Past Surgical History: Procedure Laterality Date . CARDIOVASCULAR STRESS TEST  07/01/2007  EF 74% . CATARACT EXTRACTION, BILATERAL   . CORONARY ANGIOPLASTY WITH STENT PLACEMENT  10/2004  stenting x 2 to RCA . FEMUR IM NAIL Right 11/26/2015  Procedure: INTRAMEDULLARY RIGHT (IM) NAIL FEMORAL;  Surgeon: Rod Can, MD;  Location: [facility];  Service: Orthopedics;  Laterality: Right; . FRACTURE SURGERY   . HERNIA REPAIR   . HIP ARTHROPLASTY  03/09/2011  Procedure: ARTHROPLASTY BIPOLAR HIP;  Surgeon: Mauri Pole;  Location: [facility];  Service: Orthopedics;  Laterality: Left; . INSERT / REPLACE / REMOVE PACEMAKER  10/23/2016 . LAPAROSCOPIC INCISIONAL / UMBILICAL / VENTRAL HERNIA REPAIR    "below his naval" . PACEMAKER IMPLANT N/A 10/23/2016  Procedure: Pacemaker Implant;  Surgeon: Thompson Grayer, MD;  Location: [facility];  Service: Cardiovascular;  Laterality: N/A; HPI: Pt is a 86 y.o. male with medical history significant of multiple CVAs with residual effects, hypertension, diabetes, coronary artery disease, paroxysmal atrial fibrillation, BPH, ataxia, COPD, chronic kidney disease stage III who was brought in by his wife secondary to weakness and  fever. CXR of 4/15: Persistent diffuse BILATERAL interstitial infiltrates,  which could represent pulmonary edema or atypical infection. WBC elevated and pt afebrile at time of eval.  No data recorded Assessment / Plan / Recommendation CHL IP CLINICAL IMPRESSIONS 06/19/2019 Clinical Impression Pt presents with likely acute on chronic oropahryngeal dysphagia considering the reports of the pt's wife. His swallow was marked by prolonged mastication, reduced bolus cohesion, impaired bolus formation, reduced lingual retraction, and a pharyngeal delay. He demonstrated lingual pumping, mild vallecular residue within normal range for age, and inconsistent penetration (PAS 3) of thin liquids. No instances of aspiration were noted but is likely with consecutive swallows. It is recommended that the pt's diet of dysphagia 1 (puree) solids with thin liquids be continued at this time with strict observance of swallowing precautiosn to reduce risk of laryngeal invasion and ultimate aspiration. SLP will follow continue to follow pt.  SLP Visit Diagnosis Dysphagia, oropharyngeal phase (R13.12) Attention and concentration deficit following -- Frontal lobe and executive function deficit following -- Impact on safety and function Mild aspiration risk;Moderate aspiration risk   CHL IP TREATMENT RECOMMENDATION 06/19/2019 Treatment Recommendations Therapy as outlined in treatment plan below   Prognosis 06/19/2019 Prognosis for Safe Diet Advancement Fair Barriers to Reach Goals Cognitive deficits;Time post onset Barriers/Prognosis Comment -- CHL IP DIET RECOMMENDATION 06/19/2019 SLP Diet Recommendations Dysphagia 1 (Puree) solids;Thin liquid Liquid Administration via Cup;No straw Medication Administration Crushed with puree Compensations Minimize environmental distractions;Slow rate;Small sips/bites;Follow solids with liquid Postural Changes Remain semi-upright after after feeds/meals (Comment);Seated upright at 90 degrees   CHL IP OTHER  RECOMMENDATIONS 06/19/2019 Recommended Consults -- Oral Care Recommendations Oral care BID;Oral care before and after PO Other Recommendations --   CHL IP FOLLOW UP RECOMMENDATIONS 06/19/2019 Follow up Recommendations (No Data)   CHL IP FREQUENCY AND DURATION 06/19/2019 Speech Therapy Frequency (ACUTE ONLY) min 2x/week Treatment Duration 2 weeks      CHL IP ORAL PHASE 06/19/2019 Oral Phase Impaired Oral - Pudding Teaspoon -- Oral - Pudding Cup -- Oral - Honey Teaspoon -- Oral - Honey Cup -- Oral - Nectar Teaspoon -- Oral - Nectar Cup -- Oral - Nectar Straw Lingual pumping;Reduced posterior propulsion Oral - Thin Teaspoon -- Oral - Thin Cup Lingual pumping;Reduced posterior propulsion Oral - Thin Straw Lingual pumping;Reduced posterior propulsion Oral - Puree Lingual pumping;Decreased bolus cohesion;Reduced posterior propulsion Oral - Mech Soft -- Oral - Regular Impaired mastication;Decreased bolus cohesion Oral - Multi-Consistency -- Oral - Pill Impaired mastication Oral Phase - Comment --  CHL IP PHARYNGEAL PHASE 06/19/2019 Pharyngeal Phase Impaired Pharyngeal- Pudding Teaspoon -- Pharyngeal -- Pharyngeal- Pudding Cup -- Pharyngeal -- Pharyngeal- Honey Teaspoon -- Pharyngeal -- Pharyngeal- Honey Cup -- Pharyngeal -- Pharyngeal- Nectar Teaspoon -- Pharyngeal -- Pharyngeal- Nectar Cup -- Pharyngeal -- Pharyngeal- Nectar Straw Pharyngeal residue - valleculae;Delayed swallow initiation-vallecula Pharyngeal -- Pharyngeal- Thin Teaspoon -- Pharyngeal -- Pharyngeal- Thin Cup Delayed swallow initiation-pyriform sinuses;Pharyngeal residue - valleculae Pharyngeal Material does not enter airway Pharyngeal- Thin Straw Delayed swallow initiation-vallecula;Delayed swallow initiation-pyriform sinuses;Penetration/Aspiration during swallow Pharyngeal Material enters airway, remains ABOVE vocal cords and not ejected out Pharyngeal- Puree Pharyngeal residue - valleculae Pharyngeal -- Pharyngeal- Mechanical Soft -- Pharyngeal --  Pharyngeal- Regular Pharyngeal residue - valleculae Pharyngeal -- Pharyngeal- Multi-consistency -- Pharyngeal -- Pharyngeal- Pill Pharyngeal residue - valleculae Pharyngeal -- Pharyngeal Comment --  CHL IP CERVICAL ESOPHAGEAL PHASE 06/19/2019 Cervical Esophageal Phase WFL Pudding Teaspoon -- Pudding Cup -- Honey Teaspoon -- Honey Cup -- Nectar Teaspoon -- Nectar Cup -- Nectar Straw -- Thin Teaspoon -- Thin Cup -- Thin Straw --  Puree -- Mechanical Soft -- Regular -- Multi-consistency -- Pill -- Cervical Esophageal Comment -- Shanika I. Hardin Negus, [place], [place] Office number [phone] Pager [phone] Horton Marshall 06/19/2019, 9:53 AM              ECHOCARDIOGRAM COMPLETE  Result Date: 06/18/2019    ECHOCARDIOGRAM REPORT   Patient Name:   MOHMED HASPEL Date of Exam: 06/18/2019 Medical Rec #:  [id number]       Height:       67.0 in Accession #:    [id number]      Weight:       148.1 lb Date of Birth:  Jul 15, 1927       BSA:          1.780 m Patient Age:    75 years        BP:           124/72 mmHg Patient Gender: M               HR:           79 bpm. Exam Location:  Inpatient Procedure: 2D Echo Indications:    cardiomyopathy 425.9  History:        Patient has prior history of Echocardiogram examinations, most                 recent 10/06/2017. CAD, COPD and chronic kidney disease. history                 of strokes.; Risk Factors:Diabetes and Hypertension.  Sonographer:    Johny Chess Referring Phys: [id number] Umaima Scholten CHIRAG Mouna Yager IMPRESSIONS  1. Left ventricular ejection fraction, by estimation, is 60 to 65%. The left ventricle has normal function. The left ventricle has no regional wall motion abnormalities. Left ventricular diastolic parameters are consistent with Grade I diastolic dysfunction (impaired relaxation).  2. Right ventricular systolic function is normal. The right ventricular size is mildly enlarged. There is moderately elevated pulmonary artery systolic pressure. The  estimated right ventricular systolic pressure is 123XX123 mmHg.  3. The mitral valve is normal in structure. Mild mitral valve regurgitation. No evidence of mitral stenosis.  4. Tricuspid valve regurgitation is moderate.  5. The aortic valve is normal in structure. Aortic valve regurgitation is not visualized. No aortic stenosis is present.  6. The inferior vena cava is normal in size with greater than 50% respiratory variability, suggesting right atrial pressure of 3 mmHg. FINDINGS  Left Ventricle: Left ventricular ejection fraction, by estimation, is 60 to 65%. The left ventricle has normal function. The left ventricle has no regional wall motion abnormalities. The left ventricular internal cavity size was normal in size. There is  no left ventricular hypertrophy. Left ventricular diastolic parameters are consistent with Grade I diastolic dysfunction (impaired relaxation). Normal left ventricular filling pressure. Right Ventricle: The right ventricular size is mildly enlarged. No increase in right ventricular wall thickness. Right ventricular systolic function is normal. There is moderately elevated pulmonary artery systolic pressure. The tricuspid regurgitant velocity is 3.33 m/s, and with an assumed right atrial pressure of 3 mmHg, the estimated right ventricular systolic pressure is 123XX123 mmHg. Left Atrium: Left atrial size was normal in size. Right Atrium: Right atrial size was normal in size. Pericardium: There is no evidence of pericardial effusion. Mitral Valve: The mitral valve is normal in structure. Normal mobility of the mitral valve leaflets. Mild mitral valve regurgitation. No evidence of mitral valve stenosis. Tricuspid Valve: The tricuspid valve  is normal in structure. Tricuspid valve regurgitation is moderate . No evidence of tricuspid stenosis. Aortic Valve: The aortic valve is normal in structure.. There is moderate thickening and moderate calcification of the aortic valve. Aortic valve regurgitation  is not visualized. No aortic stenosis is present. There is moderate thickening of the aortic valve. There is moderate calcification of the aortic valve. Pulmonic Valve: The pulmonic valve was normal in structure. Pulmonic valve regurgitation is not visualized. No evidence of pulmonic stenosis. Aorta: The aortic root is normal in size and structure. Venous: The inferior vena cava is normal in size with greater than 50% respiratory variability, suggesting right atrial pressure of 3 mmHg. IAS/Shunts: No atrial level shunt detected by color flow Doppler.  LEFT VENTRICLE PLAX 2D LVIDd:         4.90 cm  Diastology LVIDs:         3.40 cm  LV e' lateral:   9.36 cm/s LV PW:         0.90 cm  LV E/e' lateral: 5.4 LV IVS:        0.70 cm  LV e' medial:    5.55 cm/s LVOT diam:     1.80 cm  LV E/e' medial:  9.1 LV SV:         43 LV SV Index:   24 LVOT Area:     2.54 cm  RIGHT VENTRICLE RV S prime:     10.30 cm/s TAPSE (M-mode): 1.5 cm LEFT ATRIUM             Index LA diam:        2.80 cm 1.57 cm/m LA Vol (A2C):   36.3 ml 20.39 ml/m LA Vol (A4C):   58.8 ml 33.03 ml/m LA Biplane Vol: 50.9 ml 28.59 ml/m  AORTIC VALVE LVOT Vmax:   85.70 cm/s LVOT Vmean:  53.600 cm/s LVOT VTI:    0.168 m  AORTA Ao Root diam: 3.50 cm MITRAL VALVE               TRICUSPID VALVE MV Area (PHT): 3.12 cm    TR Peak grad:   44.4 mmHg MV Decel Time: 243 msec    TR Vmax:        333.00 cm/s MV E velocity: 50.40 cm/s MV A velocity: 77.60 cm/s  SHUNTS MV E/A ratio:  0.65        Systemic VTI:  0.17 m                            Systemic Diam: 1.80 cm Ena Dawley MD Electronically signed by Ena Dawley MD Signature Date/Time: 06/18/2019/7:36:55 PM    Final         Scheduled Meds: . apixaban  5 mg Oral BID  . aspirin EC  81 mg Oral QHS  . atorvastatin  80 mg Oral QHS  . budesonide (PULMICORT) nebulizer solution  0.5 mg Nebulization BID  . cephALEXin  500 mg Oral Q6H  . doxycycline  100 mg Oral Q12H  . feeding supplement (ENSURE ENLIVE)  237  mL Oral Q lunch  . ipratropium-albuterol  3 mL Nebulization TID  . levETIRAcetam  750 mg Oral BID  . methylPREDNISolone (SOLU-MEDROL) injection  40 mg Intravenous Q8H  . multivitamin with minerals  1 tablet Oral Q breakfast  . QUEtiapine  25 mg Oral QHS   Continuous Infusions: . sodium chloride 250 mL (06/18/19 1514)  . sodium chloride Stopped (06/18/19  0325)     LOS: 5 days   Time spent= 40 mins    Gray Doering Arsenio Loader, MD [facility]  If 7PM-7AM, please contact night-coverage  06/20/2019, 7:36 AM

## 2019-06-21 ENCOUNTER — Inpatient Hospital Stay (HOSPITAL_COMMUNITY): Payer: Medicare Other

## 2019-06-21 DIAGNOSIS — G9340 Encephalopathy, unspecified: Secondary | ICD-10-CM

## 2019-06-21 DIAGNOSIS — J9601 Acute respiratory failure with hypoxia: Secondary | ICD-10-CM

## 2019-06-21 DIAGNOSIS — L03115 Cellulitis of right lower limb: Secondary | ICD-10-CM

## 2019-06-21 DIAGNOSIS — N182 Chronic kidney disease, stage 2 (mild): Secondary | ICD-10-CM

## 2019-06-21 DIAGNOSIS — R6521 Severe sepsis with septic shock: Secondary | ICD-10-CM

## 2019-06-21 DIAGNOSIS — A419 Sepsis, unspecified organism: Principal | ICD-10-CM

## 2019-06-21 DIAGNOSIS — J810 Acute pulmonary edema: Secondary | ICD-10-CM | POA: Diagnosis not present

## 2019-06-21 LAB — CBC
HCT: 38.3 % — ABNORMAL LOW (ref 39.0–52.0)
Hemoglobin: 12.5 g/dL — ABNORMAL LOW (ref 13.0–17.0)
MCH: 30.7 pg (ref 26.0–34.0)
MCHC: 32.6 g/dL (ref 30.0–36.0)
MCV: 94.1 fL (ref 80.0–100.0)
Platelets: 464 10*3/uL — ABNORMAL HIGH (ref 150–400)
RBC: 4.07 MIL/uL — ABNORMAL LOW (ref 4.22–5.81)
RDW: 14.8 % (ref 11.5–15.5)
WBC: 24 10*3/uL — ABNORMAL HIGH (ref 4.0–10.5)
nRBC: 0 % (ref 0.0–0.2)

## 2019-06-21 LAB — GLUCOSE, CAPILLARY
Glucose-Capillary: 158 mg/dL — ABNORMAL HIGH (ref 70–99)
Glucose-Capillary: 257 mg/dL — ABNORMAL HIGH (ref 70–99)
Glucose-Capillary: 164 mg/dL — ABNORMAL HIGH (ref 70–99)
Glucose-Capillary: 112 mg/dL — ABNORMAL HIGH (ref 70–99)

## 2019-06-21 LAB — BASIC METABOLIC PANEL
Anion gap: 10 (ref 5–15)
BUN: 29 mg/dL — ABNORMAL HIGH (ref 8–23)
CO2: 23 mmol/L (ref 22–32)
Calcium: 8.8 mg/dL — ABNORMAL LOW (ref 8.9–10.3)
Chloride: 108 mmol/L (ref 98–111)
Creatinine, Ser: 1 mg/dL (ref 0.61–1.24)
GFR calc Af Amer: >60 mL/min (ref >60)
GFR calc non Af Amer: >60 mL/min (ref >60)
Glucose, Bld: 164 mg/dL — ABNORMAL HIGH (ref 70–99)
Potassium: 3.6 mmol/L (ref 3.5–5.1)
Sodium: 141 mmol/L (ref 135–145)

## 2019-06-21 LAB — BLOOD GAS, ARTERIAL
Acid-Base Excess: 0.6 mmol/L (ref 0.0–2.0)
Bicarbonate: 23.6 mmol/L (ref 20.0–28.0)
Drawn by: 535471
FIO2: 64
O2 Saturation: 85.2 %
Patient temperature: 36.5
pCO2 arterial: 30.4 mmHg — ABNORMAL LOW (ref 32.0–48.0)
pH, Arterial: 7.499 — ABNORMAL HIGH (ref 7.350–7.450)
pO2, Arterial: 47.2 mmHg — ABNORMAL LOW (ref 83.0–108.0)

## 2019-06-21 LAB — CULTURE, BLOOD (ROUTINE X 2)
Culture: NO GROWTH
Culture: NO GROWTH
Special Requests: ADEQUATE

## 2019-06-21 LAB — MAGNESIUM: Magnesium: 2.1 mg/dL (ref 1.7–2.4)

## 2019-06-21 MED ORDER — LINEZOLID 600 MG/300ML IV SOLN
600.0000 mg | Freq: Two times a day (BID) | INTRAVENOUS | Status: DC
  Administered 2019-06-21: 600 mg via INTRAVENOUS
  Filled 2019-06-21 (×2): qty 300

## 2019-06-21 MED ORDER — SODIUM CHLORIDE 0.9 % IV SOLN
750.0000 mg | Freq: Two times a day (BID) | INTRAVENOUS | Status: DC
  Administered 2019-06-21 – 2019-06-22 (×2): 750 mg via INTRAVENOUS
  Filled 2019-06-21 (×5): qty 7.5

## 2019-06-21 MED ORDER — FUROSEMIDE 10 MG/ML IJ SOLN
20.0000 mg | Freq: Four times a day (QID) | INTRAMUSCULAR | Status: AC
  Administered 2019-06-21 (×2): 20 mg via INTRAVENOUS
  Filled 2019-06-21 (×2): qty 2

## 2019-06-21 MED ORDER — HALOPERIDOL LACTATE 5 MG/ML IJ SOLN
2.0000 mg | Freq: Once | INTRAMUSCULAR | Status: AC
  Administered 2019-06-21: 2 mg via INTRAVENOUS
  Filled 2019-06-21: qty 1

## 2019-06-21 MED ORDER — CEPHALEXIN 250 MG PO CAPS
500.0000 mg | ORAL_CAPSULE | Freq: Three times a day (TID) | ORAL | Status: DC
  Administered 2019-06-21: 500 mg via ORAL
  Filled 2019-06-21 (×3): qty 2

## 2019-06-21 MED ORDER — LOPERAMIDE HCL 2 MG PO CAPS
4.0000 mg | ORAL_CAPSULE | ORAL | Status: DC | PRN
  Administered 2019-06-21: 4 mg via ORAL
  Filled 2019-06-21 (×2): qty 2

## 2019-06-21 MED ORDER — PIPERACILLIN-TAZOBACTAM 3.375 G IVPB
3.3750 g | Freq: Three times a day (TID) | INTRAVENOUS | Status: DC
  Administered 2019-06-21: 3.375 g via INTRAVENOUS
  Filled 2019-06-21 (×3): qty 50

## 2019-06-21 MED ORDER — LORAZEPAM 2 MG/ML IJ SOLN
0.5000 mg | Freq: Once | INTRAMUSCULAR | Status: AC
  Administered 2019-06-21: 0.5 mg via INTRAVENOUS
  Filled 2019-06-21: qty 1

## 2019-06-21 MED ORDER — MORPHINE SULFATE (PF) 2 MG/ML IV SOLN
1.0000 mg | Freq: Once | INTRAVENOUS | Status: AC
  Administered 2019-06-21: 1 mg via INTRAVENOUS
  Filled 2019-06-21: qty 1

## 2019-06-21 MED ORDER — DOXYCYCLINE HYCLATE 100 MG PO TABS
100.0000 mg | ORAL_TABLET | Freq: Two times a day (BID) | ORAL | Status: DC
  Administered 2019-06-21: 100 mg via ORAL
  Filled 2019-06-21 (×2): qty 1

## 2019-06-21 NOTE — Progress Notes (Signed)
NAME:  Herbert Marquez, MRN:  [id number], DOB:  Nov 21, 1927, LOS: 6 ADMISSION DATE:  07/03/2019, CONSULTATION DATE:  4/18 REFERRING MD:  [facility], CHIEF COMPLAINT:  desats on NP   Brief History   31 yowm remote smoking hx with hbp/paf, cri, bph admitted with ? Sepsis and improved hemodynamically with abx and fluids but worsening sats am 4/18 on high flow Nasal 02 so PCCM service consulted   History of present illness    65 yowm quit smoking 1973 s sequelae apparent  with medical history significant of multiple CVAs with residual effects, hypertension, diabetes, coronary artery disease, paroxysmal atrial fibrillation (on Eliquis) , BPH, ataxia, COPD, chronic kidney disease stage III who was brought in by his wife secondary to weakness and fever.  Patient also has shortness of breath. .  Symptoms developed over the last day or so PTA.  His initial sats were in the 80s apparently point was on nonrebreather bag.  He arrived the ER tachypneic.  Patient was noted to be hypotensive has a fever as well as evidence of sepsis.  With this in mind patient was being admitted for further work-up with possible  sepsis from right lower extremity cellulitis.   No reported  excess/ purulent sputum or mucus plugs or hemoptysis or cp or chest tightness, subjective wheeze or overt sinus or hb symptoms.   Chronically having trouble with cough p swallowing and since admit on D 1 diet p ST eval    Current Allergies, Complete Past Medical History, Past Surgical History, Family History, and Social History were reviewed in [employer] record.  ROS  The following are not active complaints unless bolded Hoarseness, sore throat, dysphagia, dental problems, itching, sneezing,  nasal congestion or discharge of excess mucus or purulent secretions, ear ache,   fever, chills, sweats, unintended wt loss or wt gain, classically pleuritic or exertional cp,  orthopnea pnd or arm/hand swelling  or leg swelling,  presyncope, palpitations, abdominal pain, anorexia, nausea, vomiting, diarrhea  or change in bowel habits or change in bladder habits, change in stools or change in urine, dysuria, hematuria,  rash, arthralgias, visual complaints, headache, numbness, weakness or ataxia or problems with walking or coordination,  change in mood or  memory.            Consults:  PCCM   Procedures:    Significant Diagnostic Tests:  Echo 4/15: 1. Left ventricular ejection fraction, by estimation, is 60 to 65%. The  left ventricle has normal function. The left ventricle has no regional  wall motion abnormalities. Left ventricular diastolic parameters are  consistent with Grade I diastolic  dysfunction (impaired relaxation).  2. Right ventricular systolic function is normal. The right ventricular  size is mildly enlarged. There is moderately elevated pulmonary artery  systolic pressure. The estimated right ventricular systolic pressure is  123XX123 mmHg.  3. The mitral valve is normal in structure. Mild mitral valve  regurgitation. No evidence of mitral stenosis.  4. Tricuspid valve regurgitation is moderate.  5. The aortic valve is normal in structure. Aortic valve regurgitation is  not visualized. No aortic stenosis is present.  6. The inferior vena cava is normal in size with greater than 50%  respiratory variability, suggesting right atrial pressure of 3 mmHgST eval  MBS 4/16 rec D 1 diet due to oropharangeal dysphagia   Micro Data:  BC x 2 4/12  Neg UC 4/12 multiple species  BC x 2 4/13 neg  MRSA PCR 4/13 neg  RVP  4/12 neg    Antimicrobials:  Vanc 4/12 - 4/14 Rocephin 4/12 - 4/15 Linezolid 4/15 >>> Doxy 4/16 >>>  Keflex 4/16 >>> Zosyn 4/18 >>>   Scheduled Meds: . apixaban  5 mg Oral BID  . aspirin EC  81 mg Oral QHS  . atorvastatin  80 mg Oral QHS  . budesonide (PULMICORT) nebulizer solution  0.5 mg Nebulization BID  . feeding supplement (ENSURE ENLIVE)  237 mL Oral Q lunch  .  furosemide  20 mg Intravenous Q6H  . insulin aspart  0-15 Units Subcutaneous TID WC  . insulin aspart  0-5 Units Subcutaneous QHS  . ipratropium-albuterol  3 mL Nebulization TID  . levETIRAcetam  750 mg Oral BID  . methylPREDNISolone (SOLU-MEDROL) injection  40 mg Intravenous Q8H  . multivitamin with minerals  1 tablet Oral Q breakfast  . QUEtiapine  25 mg Oral QHS   Continuous Infusions: . sodium chloride 250 mL (06/18/19 1514)  . linezolid (ZYVOX) IV 600 mg (06/21/19 1012)  . piperacillin-tazobactam (ZOSYN)  IV 3.375 g (06/21/19 1015)  . sodium chloride Stopped (06/18/19 0325)   PRN Meds:.sodium chloride, acetaminophen, calcium carbonate, hydrocortisone, loperamide, nitroGLYCERIN, ondansetron **OR** ondansetron (ZOFRAN) IV, polyethylene glycol, polyvinyl alcohol, senna-docusate, sodium chloride   Interim history/subjective:   better since rx lasix and changed to NRM this am,denies cp or sob at present  Objective   Blood pressure (!) 145/85, pulse 97, temperature 97.7 F (36.5 C), temperature source Oral, resp. rate (!) 27, height 5\' 7"  (1.702 m), weight 71.8 kg, SpO2 100 %.        Intake/Output Summary (Last 24 hours) at 06/21/2019 1139 Last data filed at 06/21/2019 1100 Gross per 24 hour  Intake 600 ml  Output 1000 ml  Net -400 ml   Filed Weights   06/19/19 0500 06/20/19 0038 06/21/19 0630  Weight: 71.5 kg 72.8 kg 71.8 kg    Examination: General: very frail elderly wm mild increased wob  Lungs: distant diffuse insp crackles Cardiovascular: s 3 gallop  Abdomen: soft/ bening Extremities: warm s significant edema or rash Neuro: alert, verbal and confused about recent events     I personally reviewed images and agree with radiology impression as follows:  CXR:  Portable 4/18   Stable cardiomegaly. Stable interstitial densities are noted throughout both lungs which may represent edema or atypical inflammation.  Resolved Hospital Problem list      Assessment &  Plan:   1)  Acute (though may have chronic component) hypoxemic resp failure in setting of pulmonary edema ? Etiology  - likely chronic cardiogenic component>> lasix already given  - ? Superimposed acute lung injury  related to sepsis (though note procalcitonin not elevated)  or aspiration >>> rx of underlying problem in progess but no role for steroids here as this is not aecopd/ asthma and this may have lead to leukocytosis which is complicating the interpretation of response to abx rx >>> prefer use NRM in this setting as nasal 02 not assuring adequate sats  >>> bipap as last resort and is temporary "bridge" at best in this setting >>> NCB approp   2) Mod elevation of PAS ? Etiology  - already on eliquis and the most likely mech would be elevated LH pressures but probably moot point here - rx is 02 and euvolemia though I could see how might be ,more prone to low bp with hypovolemia or sepsis in this setting   3) chronic dysphagia > see ST eval/rx  4)  bph ? Risk of worse renal function/ uti's >>> have asked nurses to check pvr by bladder scan  5) ? Sepsis related to cellulitis  >>> this appears to have resolved and pct was never very high so would rec simplify rx as much as possible instead of expanding      Labs   CBC: Recent Labs  Lab 06/16/2019 1852 06/16/19 0506 06/17/19 0516 06/18/19 0558 06/19/19 0428 06/20/19 0443 06/21/19 0703  WBC 17.6*   < > 15.0* 13.3* 14.9* 16.7* 24.0*  NEUTROABS 14.9*  --   --   --   --   --   --   HGB 15.3   < > 13.6 12.2* 10.9* 12.6* 12.5*  HCT 45.9   < > 43.0 37.7* 33.5* 38.7* 38.3*  MCV 92.2   < > 95.8 95.7 94.4 93.3 94.1  PLT 404*   < > 345 334 342 417* 464*   < > = values in this interval not displayed.    Basic Metabolic Panel: Recent Labs  Lab 06/17/19 0516 06/18/19 0558 06/19/19 0428 06/20/19 0443 06/21/19 0703  NA 133* 135 138 139 141  K 3.8 3.8 3.3* 4.1 3.6  CL 104 104 107 107 108  CO2 18* 21* 22 20* 23  GLUCOSE 91 137*  111* 168* 164*  BUN 14 17 19 19  29*  CREATININE 0.92 1.18 0.97 1.04 1.00  CALCIUM 8.0* 7.6* 8.0* 8.8* 8.8*  MG 3.8* 1.8 1.8 2.1 2.1   GFR: Estimated Creatinine Clearance: 46.8 mL/min (by C-G formula based on SCr of 1 mg/dL). Recent Labs  Lab 06/24/2019 1852 06/22/2019 1852 06/30/2019 2134 06/16/19 0506 06/17/19 0516 06/18/19 0558 06/19/19 0428 06/20/19 0443 06/21/19 0703  PROCALCITON  --   --   --  0.19  --   --   --   --   --   WBC 17.6*   < >  --  12.4*   < > 13.3* 14.9* 16.7* 24.0*  LATICACIDVEN 2.1*  --  2.0* 0.9  --   --   --   --   --    < > = values in this interval not displayed.    Liver Function Tests: Recent Labs  Lab 06/22/2019 1852 06/16/19 0506  AST 53* 44*  ALT 19 13  ALKPHOS 73 58  BILITOT 0.6 1.1  PROT 6.6 4.9*  ALBUMIN 3.4* 2.4*   No results for input(s): LIPASE, AMYLASE in the last 168 hours. No results for input(s): AMMONIA in the last 168 hours.  ABG    Component Value Date/Time   PHART 7.499 (H) 06/21/2019 0920   PCO2ART 30.4 (L) 06/21/2019 0920   PO2ART 47.2 (L) 06/21/2019 0920   HCO3 23.6 06/21/2019 0920   TCO2 20 (L) 10/04/2017 1340   O2SAT 85.2 06/21/2019 0920     Coagulation Profile: Recent Labs  Lab 06/21/2019 1900 06/16/19 0506  INR 1.5* 1.7*    Cardiac Enzymes: No results for input(s): CKTOTAL, CKMB, CKMBINDEX, TROPONINI in the last 168 hours.  HbA1C: Hgb A1c MFr Bld  Date/Time Value Ref Range Status  05/25/2019 04:17 PM 5.9 (H) 4.8 - 5.6 % Final    Comment:             Prediabetes: 5.7 - 6.4          Diabetes: >6.4          Glycemic control for adults with diabetes: <7.0   10/04/2017 01:35 PM 5.5 4.8 - 5.6 % Final  Comment:    (NOTE) Pre diabetes:          5.7%-6.4% Diabetes:              >6.4% Glycemic control for   <7.0% adults with diabetes     CBG: Recent Labs  Lab 06/20/19 0804 06/20/19 1142 06/20/19 1642 06/20/19 2131 06/21/19 0625  GLUCAP 166* 143* 162* 169* 158*       Past Medical History    He,  has a past medical history of Acute blood loss anemia, Acute encephalopathy (06/04/2016), Acute ischemic stroke (HCC) - L temporal lobe s/p tPA (03/30/2016), Altered mental status, Arthritis, Ataxia due to recent stroke (06/23/2015), Atrial fibrillation with rapid ventricular response (HCC), Basal cell carcinoma, Benign essential HTN, BPH (benign prostatic hypertrophy), Cerebral hemorrhage (HCC) w/ SDH s/p IV tPA, Cerebral thrombosis with cerebral infarction (10/05/2017), Cerebrovascular accident (CVA) ([place]) (09/18/2015), Chronic anticoagulation, CKD (chronic kidney disease), stage II (11/25/2015), COPD (chronic obstructive pulmonary disease) ([place]) (03/09/2011), Coronary artery disease, Coronary artery disease involving native coronary artery of native heart without angina pectoris, CVA (cerebral infarction), Dementia without behavioral disturbance ([place]), Dysphagia as late effect of cerebrovascular disease, Embolic stroke ([place]) ([id number]), Expressive aphasia (10/04/2017), Gait disturbance, post-stroke (06/23/2015), GERD (gastroesophageal reflux disease), Hemiparesis and other late effects of cerebrovascular accident ([place]) (06/29/2016), History of stroke (04/04/2016), HLD (hyperlipidemia), Hyperlipidemia, Hypertension, Ischemic stroke ([place]), Low blood pressure reading (10/14/2017), Orthostatic hypotension, PAF (paroxysmal atrial fibrillation) ([place]), Presence of permanent cardiac pacemaker, Right hemiparesis ([place]), Second degree Mobitz II AV block (10/23/2016), Seizures ([place]), Stroke [facility]), Thalamic infarction ([place]) (06/21/2015), TIA (transient ischemic attack), Type 2 diabetes mellitus with complication, without long-term current use of insulin ([place]), and Vascular dementia with behavior disturbance ([place]) (06/29/2016).   Surgical History    Past Surgical History:  Procedure Laterality Date  . CARDIOVASCULAR STRESS TEST  07/01/2007   EF 74%  . CATARACT EXTRACTION, BILATERAL    . CORONARY ANGIOPLASTY WITH STENT PLACEMENT   10/2004   stenting x 2 to RCA  . FEMUR IM NAIL Right 11/26/2015   Procedure: INTRAMEDULLARY RIGHT (IM) NAIL FEMORAL;  Surgeon: Rod Can, MD;  Location: [facility];  Service: Orthopedics;  Laterality: Right;  . FRACTURE SURGERY    . HERNIA REPAIR    . HIP ARTHROPLASTY  03/09/2011   Procedure: ARTHROPLASTY BIPOLAR HIP;  Surgeon: Mauri Pole;  Location: [facility];  Service: Orthopedics;  Laterality: Left;  . INSERT / REPLACE / REMOVE PACEMAKER  10/23/2016  . LAPAROSCOPIC INCISIONAL / UMBILICAL / VENTRAL HERNIA REPAIR     "below his naval"  . PACEMAKER IMPLANT N/A 10/23/2016   Procedure: Pacemaker Implant;  Surgeon: Thompson Grayer, MD;  Location: [facility];  Service: Cardiovascular;  Laterality: N/A;     Social History   reports that he quit smoking about 48 years ago. He has never used smokeless tobacco. He reports current alcohol use of about 8.0 standard drinks of alcohol per week. He reports that he does not use drugs.   Family History   His family history includes Hypertension in his mother; Lung cancer in his brother and father.   Allergies No Known Allergies   Home Medications  Prior to Admission medications   Medication Sig Start Date End Date Taking? Authorizing Provider  acetaminophen (TYLENOL) 325 MG tablet Take 2 tablets (650 mg total) every 4 (four) hours as needed by mouth for mild pain (or temp > 37.5 C (99.5 F)). 01/10/17  Yes Regalado, Belkys A, MD  aspirin EC 81  MG tablet Take 1 tablet (81 mg total) by mouth daily. Patient taking differently: Take 81 mg by mouth at bedtime.  10/09/16  Yes Rosalin Hawking, MD  atorvastatin (LIPITOR) 80 MG tablet Take 80 mg by mouth at bedtime.    Yes [provider]  calcium carbonate (TUMS - DOSED IN MG ELEMENTAL CALCIUM) 500 MG chewable tablet Chew 1 tablet by mouth as needed for indigestion or heartburn.    Yes [provider]  Camphor-Eucalyptus-Menthol (VICKS VAPORUB EX) Place 1 application into both nostrils at  bedtime as needed (for nasal congestion).   Yes [provider]  ELIQUIS 5 MG TABS tablet TAKE 1 TABLET BY MOUTH 2 TIMES DAILY. Patient taking differently: Take 5 mg by mouth 2 (two) times daily.  05/26/19  Yes Nahser, Wonda Cheng, MD  Ensure (ENSURE) Take 237 mLs by mouth daily with lunch.   Yes [provider]  levETIRAcetam (KEPPRA) 750 MG tablet Take 1 tablet (750 mg total) by mouth 2 (two) times daily. 04/02/19  Yes McCue, Janett Billow, NP  Multiple Vitamin (MULTIVITAMIN WITH MINERALS) TABS tablet Take 1 tablet by mouth daily with breakfast.    Yes [provider]  nitroGLYCERIN (NITROSTAT) 0.4 MG SL tablet PLACE 1 TABLET UNDER THE TONGUE EVERY 5 MINUTES AS NEEDED FOR CHEST PAIN. FOR UP TO 3 DOSES. Patient taking differently: Place 0.4 mg under the tongue every 5 (five) minutes x 3 doses as needed for chest pain.  05/25/19  Yes Nahser, Wonda Cheng, MD  phenylephrine-shark liver oil-mineral oil-petrolatum (PREPARATION H) 0.25-3-14-71.9 % rectal ointment Place 1 application rectally as needed for hemorrhoids.   Yes [provider]  polyvinyl alcohol (ARTIFICIAL TEARS) 1.4 % ophthalmic solution Place 1 drop into both eyes 2 (two) times daily as needed for dry eyes.    Yes [provider]  Pumpkin Seed-Soy Germ (AZO BLADDER CONTROL/GO-LESS PO) Take 1-2 capsules by mouth daily as needed (to decrease urination).    Yes [provider]  QUEtiapine (SEROQUEL) 25 MG tablet TAKE 1 TABLET BY MOUTH AT BEDTIME. Patient taking differently: Take 25 mg by mouth at bedtime.  04/13/19  Yes Danford, Valetta Fuller D, NP       Will see again in am to see if can help any  further - call in meantime if questions  Discussed at length with wife at bedside   Christinia Gully, MD Pulmonary and [place] [phone] After 6:00 PM or weekends, use Bee[facility] [phone]  After 7:00 pm call Elink  [phone]

## 2019-06-21 NOTE — Significant Event (Signed)
Rapid Response Event Note  Overview: Pt was found very agitated, with HFNC not on, SpO2-50s.  RN placed pt on NRB and SpO2 wasn't recovering quickly so RRT was called(Pt was on 13L HFNC prior to this event with SpO2-93 to 96%)  Initial Focused Assessment: Pt laying in bed with eyes closed, in no distress. RT already in room and had placed pt on 15L HFNC. SpO2 now 90%. Lungs diminished t/o. Skin warm and dry. T-97.5, HR-104, BP-119/81, RR-24, SpO2-90% on 15L HFNC.   Interventions: No RRT interventions Plan of Care (if not transferred): I think agitation was from hypoxia. Pt takes a long time to recover when SpO2 drops. Continue to monitor pt. May titrate HFNC down if SpO2 allows. Please call RRT if further assistance needed.   Event Summary:   Called: [phone] Arrived: 0003 Ended: 0009    Dillard [place]

## 2019-06-21 NOTE — Progress Notes (Addendum)
PROGRESS NOTE    Herbert Marquez  [id number] DOB: [birth year] DOA: 07/01/2019 PCP: No primary care provider on file.   Brief Narrative:  86 year old with history of multiple CVAs with residual effect, HTN, DM2, CAD, paroxysmal A. fib, BPH, ataxia, COPD, CKD stage III AAA presented to the hospital with complaints of weakness and fever.  Patient was diagnosed with septic shock secondary to possible urinary tract infection or right lower extremity cellulitis.  Started on IV Rocephin and vancomycin. Intermittently patient has become progressively hypoxic and hypotensive. Initially required diuretics and then some fluid bolus.  Seen by speech and swallow recommended dysphagia 1 diet.  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   BPH (benign prostatic hyperplasia)   GERD (gastroesophageal reflux disease)   CKD (chronic kidney disease), stage II   Coronary artery disease involving native coronary artery of native heart without angina pectoris   PAF (paroxysmal atrial fibrillation) (HCC)   Dementia without behavioral disturbance (HCC)   Seizures (HCC)   Presence of permanent cardiac pacemaker   Cellulitis of right leg   Acute respiratory failure with hypoxia (HCC)   Acute pulmonary edema (HCC)  Septic shock secondary to right lower extremity cellulitis Possible healthcare acquired pneumonia -Shock physiology has greatly improved.  Worsening leukocytosis, suspect from steroid we will go ahead and broaden the antibiotic coverage to Vanco and Zosyn.  Follow him clinically.  We will narrow antibiotic coverage again after pulmonary evaluation-to Keflex and doxycycline for total of 3 more days.  Acute hypoxic respiratory failure secondary to fluid overload Pulmonary  edema  Possible Underlying COPD exac; Hx of COPD -Hypoxia worsening, this morning on 15 L high flow nasal cannula.  While I was in the room I dropped down to 11 L high flow.  We can always use BiPAP if necessary -Leukocytosis could be related to steroid use but due to concerns of underlying possible HCAP, I will go and broaden the antibiotic coverage to vancomycin and Zosyn. -Continue Solu-Medrol 40 mg every 8 hours, add Accu-Chek and sliding scale. -Bronchodilators 3 times daily, incentive spirometer, Pulmicort twice daily -Lasix IV today.  Monitor blood pressure. -Consult pulmonary for their input on this. -Ideally will get CT chest to get better look at his parenchyma but given increase amount of supplemental oxygen, would hold off on this for now -Procalcitonin 0.19 -COVID-19-negative -Echocardiogram - ef 60%, G1DD. Elevated PASP -Low suspicion for PE as patient is already on Eliquis -S&S eval= Dys I Diet.  Modified barium swallow  Discontinue Solu-Medrol as pulmonary does not seem to think this is COPD exacerbation  Delirium, intermittent -Overnight he gets significantly delirious but during my evaluation every morning he is alert awake oriented X2 and fairly calm.  CKD stage IIIa -Creatinine around baseline.  History of CVA -On aspirin, statin, Eliquis  GERD -PPI  Paroxysmal atrial fibrillation -Paced rhythm.  Continue Eliquis  History of seizures -Keppra 750 mg twice daily  Prolonged QTC -Continue Seroquel.  QTC is improved, 503.  Constipation -Bowel regimen in place.  Out of bed to chair  PT evaluation once breathing is improved  DVT prophylaxis: Eliquis Code Status: DNR Family Communication: Wife at bedside Disposition Plan:   Patient From= home  Patient Anticipated D/C place= to be determined  Barriers=  still quite hypoxic requiring high flow.  Unsafe for discharge.  Subjective: Again overnight he got quite delirious and refusing his medication.   Subsequently also was quite hypoxic requiring nonrebreather briefly and then placed on high flow nasal cannula.  This morning he appears to be very calm does not have any complaints eating his breakfast.  His wife is at bedside.  Review of Systems Otherwise negative except as per HPI, including: General: Denies fever, chills, night sweats or unintended weight loss. Resp: Denies hemoptysis Cardiac: Denies chest pain, palpitations, orthopnea, paroxysmal nocturnal dyspnea. GI: Denies abdominal pain, nausea, vomiting, diarrhea or constipation GU: Denies dysuria, frequency, hesitancy or incontinence MS: Denies muscle aches, joint pain or swelling Neuro: Denies headache, neurologic deficits (focal weakness, numbness, tingling), abnormal gait Psych: Denies anxiety, depression, SI/HI/AVH Skin: Denies new rashes or lesions ID: Denies sick contacts, exotic exposures, travel  Examination: Constitutional: Appears chronically ill, on 15 L high flow nasal cannula Respiratory: Bilateral diffuse rhonchi Cardiovascular: Normal sinus rhythm, no rubs Abdomen: Nontender nondistended good bowel sounds Musculoskeletal: No edema noted Skin: No rashes seen Neurologic: CN 2-12 grossly intact.  And nonfocal Psychiatric: Poor judgment and insight.  Alert to name and place.  Objective: Vitals:   06/21/19 0630 06/21/19 0632 06/21/19 0633 06/21/19 0851  BP: (!) 119/92     Pulse: 95 90 93   Resp: (!) 32 (!) 33 (!) 26   Temp:      TempSrc:      SpO2: 90% (!) 85% (!) 87% 100%  Weight: 71.8 kg     Height:        Intake/Output Summary (Last 24 hours) at 06/21/2019 0858 Last data filed at 06/21/2019 0600 Gross per 24 hour  Intake 600 ml  Output 750 ml  Net -150 ml   Filed Weights   06/19/19 0500 06/20/19 0038 06/21/19 0630  Weight: 71.5 kg 72.8 kg 71.8 kg     Data Reviewed:   CBC: Recent Labs  Lab 06/25/2019 1852 06/16/19 0506 06/17/19 0516 06/18/19 0558 06/19/19 0428 06/20/19 0443  06/21/19 0703  WBC 17.6*   < > 15.0* 13.3* 14.9* 16.7* 24.0*  NEUTROABS 14.9*  --   --   --   --   --   --   HGB 15.3   < > 13.6 12.2* 10.9* 12.6* 12.5*  HCT 45.9   < > 43.0 37.7* 33.5* 38.7* 38.3*  MCV 92.2   < > 95.8 95.7 94.4 93.3 94.1  PLT 404*   < > 345 334 342 417* 464*   < > = values in this interval not displayed.   Basic Metabolic Panel: Recent Labs  Lab 06/17/19 0516 06/18/19 0558 06/19/19 0428 06/20/19 0443 06/21/19 0703  NA 133* 135 138 139 141  K 3.8 3.8 3.3* 4.1 3.6  CL 104 104 107 107 108  CO2 18* 21* 22 20* 23  GLUCOSE 91 137* 111* 168* 164*  BUN 14 17 19 19  29*  CREATININE 0.92 1.18 0.97 1.04 1.00  CALCIUM 8.0* 7.6* 8.0* 8.8* 8.8*  MG 3.8* 1.8 1.8 2.1 2.1   GFR: Estimated Creatinine Clearance: 46.8 mL/min (by C-G formula based on SCr of 1 mg/dL). Liver Function Tests: Recent Labs  Lab 06/19/2019 1852 06/16/19 0506  AST 53* 44*  ALT 19 13  ALKPHOS 73 58  BILITOT 0.6 1.1  PROT 6.6 4.9*  ALBUMIN 3.4* 2.4*   No results for input(s): LIPASE, AMYLASE in the last 168 hours. No results for input(s): AMMONIA in the  last 168 hours. Coagulation Profile: Recent Labs  Lab 06/14/2019 1900 06/16/19 0506  INR 1.5* 1.7*   Cardiac Enzymes: No results for input(s): CKTOTAL, CKMB, CKMBINDEX, TROPONINI in the last 168 hours. BNP (last 3 results) No results for input(s): PROBNP in the last 8760 hours. HbA1C: No results for input(s): HGBA1C in the last 72 hours. CBG: Recent Labs  Lab 06/20/19 0804 06/20/19 1142 06/20/19 1642 06/20/19 2131 06/21/19 0625  GLUCAP 166* 143* 162* 169* 158*   Lipid Profile: No results for input(s): CHOL, HDL, LDLCALC, TRIG, CHOLHDL, LDLDIRECT in the last 72 hours. Thyroid Function Tests: No results for input(s): TSH, T4TOTAL, FREET4, T3FREE, THYROIDAB in the last 72 hours. Anemia Panel: No results for input(s): VITAMINB12, FOLATE, FERRITIN, TIBC, IRON, RETICCTPCT in the last 72 hours. Sepsis Labs: Recent Labs  Lab  06/12/2019 1852 06/28/2019 2134 06/16/19 0506  PROCALCITON  --   --  0.19  LATICACIDVEN 2.1* 2.0* 0.9    Recent Results (from the past 240 hour(s))  Culture, blood (routine x 2)     Status: None   Collection Time: 06/16/2019  6:45 PM   Specimen: BLOOD  Result Value Ref Range Status   Specimen Description BLOOD BLOOD RIGHT WRIST  Final   Special Requests   Final    BOTTLES DRAWN AEROBIC AND ANAEROBIC Blood Culture adequate volume   Culture   Final    NO GROWTH 5 DAYS Performed at [facility], [place] [address], [place], [place] [address]    Report Status 06/20/2019 FINAL  Final  Culture, blood (routine x 2)     Status: None   Collection Time: 06/25/2019  6:45 PM   Specimen: BLOOD  Result Value Ref Range Status   Specimen Description BLOOD RIGHT UPPER ARM  Final   Special Requests   Final    BOTTLES DRAWN AEROBIC ONLY Blood Culture results may not be optimal due to an inadequate volume of blood received in culture bottles   Culture   Final    NO GROWTH 5 DAYS Performed at [facility], [place] [address] [place], [place] [address]    Report Status 06/20/2019 FINAL  Final  Respiratory Panel by RT PCR (Flu A&B, Covid) - Nasopharyngeal Swab     Status: None   Collection Time: 06/20/2019  7:34 PM   Specimen: Nasopharyngeal Swab  Result Value Ref Range Status   SARS Coronavirus 2 by RT PCR NEGATIVE NEGATIVE Final    Comment: (NOTE) SARS-CoV-2 target nucleic acids are NOT DETECTED. The SARS-CoV-2 RNA is generally detectable in upper respiratoy specimens during the acute phase of infection. The lowest concentration of SARS-CoV-2 viral copies this assay can detect is 131 copies/mL. A negative result does not preclude SARS-Cov-2 infection and should not be used as the sole basis for treatment or other patient management decisions. A negative result may occur with  improper specimen collection/handling, submission of specimen other than nasopharyngeal swab, presence of viral  mutation(s) within the areas targeted by this assay, and inadequate number of viral copies (<131 copies/mL). A negative result must be combined with clinical observations, patient history, and epidemiological information. The expected result is Negative. Fact Sheet for Patients:  PinkCheek.be Fact Sheet for Healthcare Providers:  GravelBags.it This test is not yet ap proved or cleared by the [place] FDA and  has been authorized for detection and/or diagnosis of SARS-CoV-2 by FDA under an Emergency Use Authorization (EUA). This EUA will remain  in effect (meaning this test can be  used) for the duration of the COVID-19 declaration under Section 564(b)(1) of the Act, 21 U.S.C. section 360bbb-3(b)(1), unless the authorization is terminated or revoked sooner.    Influenza A by PCR NEGATIVE NEGATIVE Final   Influenza B by PCR NEGATIVE NEGATIVE Final    Comment: (NOTE) The Xpert Xpress SARS-CoV-2/FLU/RSV assay is intended as an aid in  the diagnosis of influenza from Nasopharyngeal swab specimens and  should not be used as a sole basis for treatment. Nasal washings and  aspirates are unacceptable for Xpert Xpress SARS-CoV-2/FLU/RSV  testing. Fact Sheet for Patients: PinkCheek.be Fact Sheet for Healthcare Providers: GravelBags.it This test is not yet approved or cleared by the [place] FDA and  has been authorized for detection and/or diagnosis of SARS-CoV-2 by  FDA under an Emergency Use Authorization (EUA). This EUA will remain  in effect (meaning this test can be used) for the duration of the  Covid-19 declaration under Section 564(b)(1) of the Act, 21  U.S.C. section 360bbb-3(b)(1), unless the authorization is  terminated or revoked. Performed at [facility], [place] [address] [place], [place] [address]   Urine culture     Status: Abnormal   Collection  Time: 06/05/2019  7:50 PM   Specimen: Urine, Catheterized  Result Value Ref Range Status   Specimen Description URINE, CATHETERIZED  Final   Special Requests   Final    NONE Performed at [facility], [place] [address] [place], [place] [address]    Culture MULTIPLE SPECIES PRESENT, SUGGEST RECOLLECTION (A)  Final   Report Status 06/17/2019 FINAL  Final  MRSA PCR Screening     Status: None   Collection Time: 06/16/19 12:04 AM   Specimen: Nasal Mucosa; Nasopharyngeal  Result Value Ref Range Status   MRSA by PCR NEGATIVE NEGATIVE Final    Comment:        The GeneXpert MRSA Assay (FDA approved for NASAL specimens only), is one component of a comprehensive MRSA colonization surveillance program. It is not intended to diagnose MRSA infection nor to guide or monitor treatment for MRSA infections. Performed at [facility], [place] [address], [place], [place] [address]   Culture, blood (Routine X 2) w Reflex to ID Panel     Status: None   Collection Time: 06/16/19  6:20 PM   Specimen: BLOOD  Result Value Ref Range Status   Specimen Description BLOOD LEFT ANTECUBITAL  Final   Special Requests   Final    BOTTLES DRAWN AEROBIC ONLY Blood Culture results may not be optimal due to an inadequate volume of blood received in culture bottles   Culture   Final    NO GROWTH 5 DAYS Performed at [facility], [place] [address] [place], [place] [address]    Report Status 06/21/2019 FINAL  Final  Culture, blood (Routine X 2) w Reflex to ID Panel     Status: None   Collection Time: 06/16/19  6:26 PM   Specimen: BLOOD LEFT HAND  Result Value Ref Range Status   Specimen Description BLOOD LEFT HAND  Final   Special Requests   Final    BOTTLES DRAWN AEROBIC AND ANAEROBIC Blood Culture adequate volume   Culture   Final    NO GROWTH 5 DAYS Performed at [facility], [place] [address] [place], [place] [address]    Report Status 06/21/2019 FINAL  Final         Radiology  Studies: No results found.  Scheduled Meds: . apixaban  5 mg Oral BID  . aspirin EC  81 mg Oral QHS  . atorvastatin  80 mg Oral QHS  . budesonide (PULMICORT) nebulizer solution  0.5 mg Nebulization BID  . feeding supplement (ENSURE ENLIVE)  237 mL Oral Q lunch  . furosemide  20 mg Intravenous Q6H  . insulin aspart  0-15 Units Subcutaneous TID WC  . insulin aspart  0-5 Units Subcutaneous QHS  . ipratropium-albuterol  3 mL Nebulization TID  . levETIRAcetam  750 mg Oral BID  . methylPREDNISolone (SOLU-MEDROL) injection  40 mg Intravenous Q8H  . multivitamin with minerals  1 tablet Oral Q breakfast  . QUEtiapine  25 mg Oral QHS   Continuous Infusions: . sodium chloride 250 mL (06/18/19 1514)  . linezolid (ZYVOX) IV    . piperacillin-tazobactam (ZOSYN)  IV    . sodium chloride Stopped (06/18/19 0325)     LOS: 6 days   Time spent= 40 mins    Marlayna Bannister Arsenio Loader, MD [facility]  If 7PM-7AM, please contact night-coverage  06/21/2019, 8:58 AM

## 2019-06-21 NOTE — Progress Notes (Signed)
Pharmacy Antibiotic Note  Herbert Marquez is a 86 y.o. male admitted on 06/13/2019 with sepsis 2/2 RLE cellulitis. Pharmacy has been consulted for Zosyn dosing for new HCAP. Patient had a hypoxic episode with SpO2s down to the 50s overnight after not having his nasal cannula in. Patient is afebrile. He is now on 15 L HFNC and is tachypnic. Last CXR on 4/15 showed persistent diffuse bilateral infiltrates representing pulmonary edema or atypical infection. WBC count is elevated at 24.0 but please note the patient has been on IV steroids since 4/15. He has received abx since 4/12. He has a history of COPD. Renal function stable with a creatinine around 1 and eCrCl ~46 ml/min.   Plan: Zosyn 3.375 mg IV every 8 hours  Follow up clinical status, renal function  Follow up opportunity for de-escalation and length of therapy   Height: 5\' 7"  (170.2 cm) Weight: 71.8 kg (158 lb 4.6 oz) IBW/kg (Calculated) : 66.1  Temp (24hrs), Avg:97.7 F (36.5 C), Min:97.5 F (36.4 C), Max:98.1 F (36.7 C)  Recent Labs  Lab 06/12/2019 1852 06/14/2019 1852 06/08/2019 2134 06/16/19 0506 06/16/19 0506 06/17/19 0516 06/18/19 0558 06/19/19 0428 06/20/19 0443 06/21/19 0703  WBC 17.6*   < >  --  12.4*   < > 15.0* 13.3* 14.9* 16.7* 24.0*  CREATININE 1.16   < >  --  1.15   < > 0.92 1.18 0.97 1.04 1.00  LATICACIDVEN 2.1*  --  2.0* 0.9  --   --   --   --   --   --    < > = values in this interval not displayed.    Estimated Creatinine Clearance: 46.8 mL/min (by C-G formula based on SCr of 1 mg/dL).    No Known Allergies  Antimicrobials this admission: ceftriaxone 4/12 >> 4/15 Cephalexin 4/16 >> 4/18 vancomycin 4/12 >> 7/15 linezolid 4/15 >> 4/16 then restarted 4/18  Doxycyline 4/16 >> 4/18 Zosyn 4/18 >>  Microbiology results: 4/12 BCx: ngtd 4/13 BCx: ngtd 4/14 UCx: multiple species, suggest recollection   4/13 MRSA PCR: neg 4/12 Flu/Covid: Neg  Thank you for allowing pharmacy to be a part of this patient's  care.   Eddie Candle, PharmD PGY-1 Pharmacy Resident   Please check amion for clinical pharmacist contact number 06/21/2019 8:39 AM

## 2019-06-21 NOTE — Progress Notes (Signed)
Patient desating in the 71's. RN placed patient on NRB with sats only in the mid 80's. Patient in distress and states he cant' breathe while neb treatment was running. Patient placed on heated high flow 25L 100% and sats are now at 91%. RN at bedside. MD called and okay for heated high flow. RT will continue to monitor.

## 2019-06-22 ENCOUNTER — Telehealth: Payer: Self-pay | Admitting: Family Medicine

## 2019-06-22 DIAGNOSIS — J9601 Acute respiratory failure with hypoxia: Secondary | ICD-10-CM | POA: Diagnosis not present

## 2019-06-22 DIAGNOSIS — G9340 Encephalopathy, unspecified: Secondary | ICD-10-CM | POA: Diagnosis not present

## 2019-06-22 DIAGNOSIS — I69359 Hemiplegia and hemiparesis following cerebral infarction affecting unspecified side: Secondary | ICD-10-CM

## 2019-06-22 DIAGNOSIS — J449 Chronic obstructive pulmonary disease, unspecified: Secondary | ICD-10-CM

## 2019-06-22 DIAGNOSIS — I2721 Secondary pulmonary arterial hypertension: Secondary | ICD-10-CM

## 2019-06-22 DIAGNOSIS — I69398 Other sequelae of cerebral infarction: Secondary | ICD-10-CM

## 2019-06-22 LAB — BASIC METABOLIC PANEL
Anion gap: 12 (ref 5–15)
BUN: 35 mg/dL — ABNORMAL HIGH (ref 8–23)
CO2: 29 mmol/L (ref 22–32)
Calcium: 8.8 mg/dL — ABNORMAL LOW (ref 8.9–10.3)
Chloride: 106 mmol/L (ref 98–111)
Creatinine, Ser: 1.06 mg/dL (ref 0.61–1.24)
GFR calc Af Amer: >60 mL/min (ref >60)
GFR calc non Af Amer: >60 mL/min (ref >60)
Glucose, Bld: 121 mg/dL — ABNORMAL HIGH (ref 70–99)
Potassium: 3.5 mmol/L (ref 3.5–5.1)
Sodium: 147 mmol/L — ABNORMAL HIGH (ref 135–145)

## 2019-06-22 LAB — CBC
HCT: 39.7 % (ref 39.0–52.0)
Hemoglobin: 12.8 g/dL — ABNORMAL LOW (ref 13.0–17.0)
MCH: 30.8 pg (ref 26.0–34.0)
MCHC: 32.2 g/dL (ref 30.0–36.0)
MCV: 95.7 fL (ref 80.0–100.0)
Platelets: 487 10*3/uL — ABNORMAL HIGH (ref 150–400)
RBC: 4.15 MIL/uL — ABNORMAL LOW (ref 4.22–5.81)
RDW: 15 % (ref 11.5–15.5)
WBC: 25.5 10*3/uL — ABNORMAL HIGH (ref 4.0–10.5)
nRBC: 0 % (ref 0.0–0.2)

## 2019-06-22 LAB — GLUCOSE, CAPILLARY: Glucose-Capillary: 115 mg/dL — ABNORMAL HIGH (ref 70–99)

## 2019-06-22 LAB — MAGNESIUM: Magnesium: 2.2 mg/dL (ref 1.7–2.4)

## 2019-06-22 MED ORDER — MORPHINE SULFATE (PF) 2 MG/ML IV SOLN
1.0000 mg | INTRAVENOUS | Status: DC | PRN
  Administered 2019-06-22 – 2019-06-23 (×3): 1 mg via INTRAVENOUS
  Filled 2019-06-22 (×3): qty 1

## 2019-06-22 MED ORDER — FUROSEMIDE 10 MG/ML IJ SOLN
40.0000 mg | Freq: Once | INTRAMUSCULAR | Status: AC
  Administered 2019-06-22: 40 mg via INTRAVENOUS
  Filled 2019-06-22: qty 4

## 2019-06-22 MED ORDER — BIOTENE DRY MOUTH MT LIQD: 15.0000 mL | OROMUCOSAL | Status: DC | PRN

## 2019-06-22 MED ORDER — LORAZEPAM 2 MG/ML PO CONC: 1.0000 mg | ORAL | Status: DC | PRN

## 2019-06-22 MED ORDER — OXYBUTYNIN CHLORIDE 5 MG PO TABS: 2.5000 mg | ORAL_TABLET | Freq: Four times a day (QID) | ORAL | Status: DC | PRN

## 2019-06-22 MED ORDER — ONDANSETRON HCL 4 MG/2ML IJ SOLN: 4.0000 mg | Freq: Four times a day (QID) | INTRAMUSCULAR | Status: DC | PRN

## 2019-06-22 MED ORDER — MAGIC MOUTHWASH W/LIDOCAINE
15.0000 mL | Freq: Four times a day (QID) | ORAL | Status: DC | PRN
  Filled 2019-06-22: qty 15

## 2019-06-22 MED ORDER — POTASSIUM CHLORIDE 10 MEQ/100ML IV SOLN: 10.0000 meq | INTRAVENOUS | Status: AC

## 2019-06-22 MED ORDER — HALOPERIDOL LACTATE 2 MG/ML PO CONC
0.5000 mg | ORAL | Status: DC | PRN
  Filled 2019-06-22: qty 0.3

## 2019-06-22 MED ORDER — DIPHENHYDRAMINE HCL 50 MG/ML IJ SOLN: 12.5000 mg | INTRAMUSCULAR | Status: DC | PRN

## 2019-06-22 MED ORDER — HALOPERIDOL 0.5 MG PO TABS
0.5000 mg | ORAL_TABLET | ORAL | Status: DC | PRN
  Filled 2019-06-22: qty 1

## 2019-06-22 MED ORDER — ONDANSETRON 4 MG PO TBDP: 4.0000 mg | ORAL_TABLET | Freq: Four times a day (QID) | ORAL | Status: DC | PRN

## 2019-06-22 MED ORDER — MORPHINE SULFATE (CONCENTRATE) 10 MG/0.5ML PO SOLN
5.0000 mg | ORAL | Status: DC | PRN
  Administered 2019-06-22: 5 mg via ORAL
  Filled 2019-06-22: qty 0.5

## 2019-06-22 MED ORDER — LORAZEPAM 2 MG/ML IJ SOLN
1.0000 mg | INTRAMUSCULAR | Status: DC | PRN
  Administered 2019-06-22 – 2019-06-23 (×2): 1 mg via INTRAVENOUS
  Filled 2019-06-22 (×2): qty 1

## 2019-06-22 MED ORDER — HALOPERIDOL LACTATE 5 MG/ML IJ SOLN: 0.5000 mg | INTRAMUSCULAR | Status: DC | PRN

## 2019-06-22 MED ORDER — MORPHINE SULFATE (CONCENTRATE) 10 MG/0.5ML PO SOLN
5.0000 mg | ORAL | Status: DC | PRN
  Administered 2019-06-22 (×3): 5 mg via SUBLINGUAL
  Filled 2019-06-22 (×3): qty 0.5

## 2019-06-22 MED ORDER — POLYVINYL ALCOHOL 1.4 % OP SOLN
1.0000 [drp] | Freq: Four times a day (QID) | OPHTHALMIC | Status: DC | PRN
  Filled 2019-06-22: qty 15

## 2019-06-22 MED ORDER — MORPHINE SULFATE (PF) 2 MG/ML IV SOLN
1.0000 mg | Freq: Once | INTRAVENOUS | Status: AC
  Administered 2019-06-22: 1 mg via INTRAVENOUS
  Filled 2019-06-22: qty 1

## 2019-06-22 MED ORDER — LORAZEPAM 1 MG PO TABS
1.0000 mg | ORAL_TABLET | ORAL | Status: DC | PRN
  Administered 2019-06-22: 17:00:00 1 mg via ORAL
  Filled 2019-06-22: qty 1

## 2019-06-22 NOTE — Plan of Care (Signed)

## 2019-06-22 NOTE — Progress Notes (Signed)
NAME:  Herbert Marquez, MRN:  [id number], DOB:  11-26-1927, LOS: 7 ADMISSION DATE:  06/21/2019, CONSULTATION DATE:  4/18 REFERRING MD:  triad, CHIEF COMPLAINT:  desats on NP   Brief History   58 yowm remote smoking hx with hbp/paf, cri, bph admitted with ? Sepsis and improved hemodynamically with abx and fluids but worsening sats am 4/18 on high flow Nasal 02 so PCCM service consulted   History of present illness    73 yowm quit smoking 1973 s sequelae apparent  with medical history significant of multiple CVAs with residual effects, hypertension, diabetes, coronary artery disease, paroxysmal atrial fibrillation (on Eliquis) , BPH, ataxia, COPD, chronic kidney disease stage III who was brought in by his wife secondary to weakness and fever.  Patient also has shortness of breath. .  Symptoms developed over the last day or so PTA.  His initial sats were in the 80s apparently point was on nonrebreather bag.  He arrived the ER tachypneic.  Patient was noted to be hypotensive has a fever as well as evidence of sepsis.  With this in mind patient was being admitted for further work-up with possible  sepsis from right lower extremity cellulitis.   No reported  excess/ purulent sputum or mucus plugs or hemoptysis or cp or chest tightness, subjective wheeze or overt sinus or hb symptoms.   Chronically having trouble with cough p swallowing and since admit on D 1 diet p ST eval    Current Allergies, Complete Past Medical History, Past Surgical History, Family History, and Social History were reviewed in [employer] record.  ROS  The following are not active complaints unless bolded Hoarseness, sore throat, dysphagia, dental problems, itching, sneezing,  nasal congestion or discharge of excess mucus or purulent secretions, ear ache,   fever, chills, sweats, unintended wt loss or wt gain, classically pleuritic or exertional cp,  orthopnea pnd or arm/hand swelling  or leg swelling,  presyncope, palpitations, abdominal pain, anorexia, nausea, vomiting, diarrhea  or change in bowel habits or change in bladder habits, change in stools or change in urine, dysuria, hematuria,  rash, arthralgias, visual complaints, headache, numbness, weakness or ataxia or problems with walking or coordination,  change in mood or  memory.            Consults:  PCCM   Procedures:    Significant Diagnostic Tests:  Echo 4/15: 1. Left ventricular ejection fraction, by estimation, is 60 to 65%. The  left ventricle has normal function. The left ventricle has no regional  wall motion abnormalities. Left ventricular diastolic parameters are  consistent with Grade I diastolic  dysfunction (impaired relaxation).  2. Right ventricular systolic function is normal. The right ventricular  size is mildly enlarged. There is moderately elevated pulmonary artery  systolic pressure. The estimated right ventricular systolic pressure is  [id number] mmHg.  3. The mitral valve is normal in structure. Mild mitral valve  regurgitation. No evidence of mitral stenosis.  4. Tricuspid valve regurgitation is moderate.  5. The aortic valve is normal in structure. Aortic valve regurgitation is  not visualized. No aortic stenosis is present.  6. The inferior vena cava is normal in size with greater than 50%  respiratory variability, suggesting right atrial pressure of 3 mmHgST eval  MBS 4/16 rec D 1 diet due to oropharangeal dysphagia   Micro Data:  BC x 2 4/12  Neg UC 4/12 multiple species  BC x 2 4/13 neg  MRSA PCR 4/13 neg  RVP  4/12 neg   Antimicrobials:  Vanc 4/12 - 4/14 Rocephin 4/12 - 4/15 Linezolid 4/15 >>> Doxy 4/16 >>>  Keflex 4/16 >>> Zosyn 4/18 >>>   Scheduled Meds: . apixaban  5 mg Oral BID  . aspirin EC  81 mg Oral QHS  . atorvastatin  80 mg Oral QHS  . budesonide (PULMICORT) nebulizer solution  0.5 mg Nebulization BID  . cephALEXin  500 mg Oral Q8H  . doxycycline  100 mg Oral Q12H  .  feeding supplement (ENSURE ENLIVE)  237 mL Oral Q lunch  . insulin aspart  0-15 Units Subcutaneous TID WC  . insulin aspart  0-5 Units Subcutaneous QHS  . ipratropium-albuterol  3 mL Nebulization TID  . multivitamin with minerals  1 tablet Oral Q breakfast  . QUEtiapine  25 mg Oral QHS   Continuous Infusions: . sodium chloride 250 mL (06/18/19 1514)  . levETIRAcetam 750 mg (06/21/19 2253)  . sodium chloride Stopped (06/18/19 0325)   PRN Meds:.sodium chloride, acetaminophen, calcium carbonate, hydrocortisone, loperamide, nitroGLYCERIN, ondansetron **OR** ondansetron (ZOFRAN) IV, polyethylene glycol, polyvinyl alcohol, senna-docusate, sodium chloride   Interim history/subjective:  Worsening desaturations since yesterday. Trialed heated high flow which he tolerated during the day however overnight, he removed HHF and had difficulty recovering sats so was started on BiPAP. On BiPAP this morning and reports discomfort with mask.  Objective   Blood pressure (!) 136/91, pulse 88, temperature 98 F (36.7 C), temperature source Oral, resp. rate (!) 29, height 5\' 7"  (1.702 m), weight 68.6 kg, SpO2 99 %.    FiO2 (%):  [80 %-100 %] 100 %   Intake/Output Summary (Last 24 hours) at 06/22/2019 1053 Last data filed at 06/22/2019 0100 Gross per 24 hour  Intake 270 ml  Output 1000 ml  Net -730 ml   Filed Weights   06/20/19 0038 06/21/19 0630 06/22/19 0153  Weight: 72.8 kg 71.8 kg 68.6 kg   Physical Exam: General: Chronically ill-appearing, appears uncomfortable with BiPAP mask but no acute distress HENT: [place], AT, wearing BiPAP Eyes: EOMI, no scleral icterus Respiratory: Vented breath sounds.  No crackles, wheezing or rales Cardiovascular: RRR, -M/R/G, no JVD Extremities: 1+ pitting edema in lower extremities,-tenderness Neuro: AAO x4, CNII-XII grossly intact Psych: Anxious mood, normal affect  Resolved Hospital Problem list      Assessment & Plan:   Acute hypoxemic resp failure in setting  of pulmonary edema +/- pneumonia. S/p treatment for COPD exacerbation with steroids which were discontinued as not likely an exacerbation. --Transition to heated HFNC --Can return to BiPAP if needed for work of breathing --Agree with antibiotic management per primary team --Continue scheduled Pulmicort and Duonebs --Diurese --CXR ordered for tomorrow AM --Patient currently DNR. Discussed goals of care with patient and wife at bedside. Recommended Palliative care and consideration of hospital if he is refractory to medical management. Consult placed.  Paroxysmal atrial fibrillation SVT overnight reported --Telemetry --Goal K >4 and Mg >2 --Replete K in setting of diureses --On eliquis  Chronic dysphagia  --Dysphagia diet    Pulmonary will continue to follow.  Labs   CBC: Recent Labs  Lab 06/13/2019 1852 06/16/19 0506 06/18/19 0558 06/19/19 0428 06/20/19 0443 06/21/19 0703 06/22/19 0805  WBC 17.6*   < > 13.3* 14.9* 16.7* 24.0* 25.5*  NEUTROABS 14.9*  --   --   --   --   --   --   HGB 15.3   < > 12.2* 10.9* 12.6* 12.5* 12.8*  HCT 45.9   < >  37.7* 33.5* 38.7* 38.3* 39.7  MCV 92.2   < > 95.7 94.4 93.3 94.1 95.7  PLT 404*   < > 334 342 417* 464* 487*   < > = values in this interval not displayed.    Basic Metabolic Panel: Recent Labs  Lab 06/18/19 0558 06/19/19 0428 06/20/19 0443 06/21/19 0703 06/22/19 0805  NA 135 138 139 141 147*  K 3.8 3.3* 4.1 3.6 3.5  CL 104 107 107 108 106  CO2 21* 22 20* 23 29  GLUCOSE 137* 111* 168* 164* 121*  BUN 17 19 19  29* 35*  CREATININE 1.18 0.97 1.04 1.00 1.06  CALCIUM 7.6* 8.0* 8.8* 8.8* 8.8*  MG 1.8 1.8 2.1 2.1 2.2   GFR: Estimated Creatinine Clearance: 44.2 mL/min (by C-G formula based on SCr of 1.06 mg/dL). Recent Labs  Lab 06/25/2019 1852 06/10/2019 1852 06/09/2019 2134 06/16/19 0506 06/17/19 0516 06/19/19 0428 06/20/19 0443 06/21/19 0703 06/22/19 0805  PROCALCITON  --   --   --  0.19  --   --   --   --   --   WBC 17.6*    < >  --  12.4*   < > 14.9* 16.7* 24.0* 25.5*  LATICACIDVEN 2.1*  --  2.0* 0.9  --   --   --   --   --    < > = values in this interval not displayed.    Liver Function Tests: Recent Labs  Lab 06/10/2019 1852 06/16/19 0506  AST 53* 44*  ALT 19 13  ALKPHOS 73 58  BILITOT 0.6 1.1  PROT 6.6 4.9*  ALBUMIN 3.4* 2.4*   No results for input(s): LIPASE, AMYLASE in the last 168 hours. No results for input(s): AMMONIA in the last 168 hours.  ABG    Component Value Date/Time   PHART 7.499 (H) 06/21/2019 0920   PCO2ART 30.4 (L) 06/21/2019 0920   PO2ART 47.2 (L) 06/21/2019 0920   HCO3 23.6 06/21/2019 0920   TCO2 20 (L) 10/04/2017 1340   O2SAT 85.2 06/21/2019 0920     Coagulation Profile: Recent Labs  Lab 07/01/2019 1900 06/16/19 0506  INR 1.5* 1.7*    Cardiac Enzymes: No results for input(s): CKTOTAL, CKMB, CKMBINDEX, TROPONINI in the last 168 hours.  HbA1C: Hgb A1c MFr Bld  Date/Time Value Ref Range Status  05/25/2019 04:17 PM 5.9 (H) 4.8 - 5.6 % Final    Comment:             Prediabetes: 5.7 - 6.4          Diabetes: >6.4          Glycemic control for adults with diabetes: <7.0   10/04/2017 01:35 PM 5.5 4.8 - 5.6 % Final    Comment:    (NOTE) Pre diabetes:          5.7%-6.4% Diabetes:              >6.4% Glycemic control for   <7.0% adults with diabetes     CBG: Recent Labs  Lab 06/21/19 0625 06/21/19 1349 06/21/19 1739 06/21/19 2249 06/22/19 0714  GLUCAP 158* 257* 164* 112* 115*    Discussed plan with patient and wife at bedside, primary team and RT.  Rodman Pickle, M.D. [facility] Pulmonary/Critical Care Medicine 06/22/2019 10:53 AM

## 2019-06-22 NOTE — Progress Notes (Signed)
Pt had some SVT in the 150's 2x, first when he pulled off his O2 and 2nd when he was lying in the bed, pt is having to have nonrebreather and the heated HFNC to sustain his sats in the 90's when he pulls off his mask or O2 his sats go down to the 50-60's, pt continues to have a longer time to get his sats back to the 90's when he pulls off his O2,  MD notified, will contineu to monitor, Thanks Arvella Nigh RN.

## 2019-06-22 NOTE — Progress Notes (Signed)
Attempted to turn pt but family declined. They stated they were told pt might not live throught the night and the last time he was turned it seemed to cause him more distress. Discussed with wife and daughters the risks of not turning pt and they verbalized an understanding but continued to decline. They did state staff could continue to assess for BM and clean pt if necessary.

## 2019-06-22 NOTE — Telephone Encounter (Signed)
-----   Message from [name], DO sent at 06/16/2019  6:19 PM EDT ----- [place] PCP [place]  This patient was seen by me for acute care appointment on 05/25/2019.  However, even though a chest x-ray ordered at that time, this just recently had been done by pt.  Please call patient and see if he continues to very slowly improve or if his condition has worsened.  If he is slowly improving, no need for further work-up to r/o possible acute process in lungs at this time.  If W, as discussed at last OV, pulm referral may be necessary.  Furthermore, as advised, I recommend he make a follow-up office visit with Herb Grays in the future for chronic care management.   (it was 2019 when he was last seen by [facility] for any chronic care here in the office.  He needs Medicare wellness, probable labs etc)

## 2019-06-22 NOTE — Progress Notes (Signed)
Letter sent with results since 3 attempts have been made to contact patient with no answer and no reply. AS, CMA

## 2019-06-22 NOTE — Progress Notes (Signed)
SLP Cancellation Note  Patient Details Name: Herbert Marquez MRN: [id number] DOB: [birth year]   Cancelled treatment:       Reason Eval/Treat Not Completed: Medical issues which prohibited therapy(note pt with respiratory issues this am, now on ? Cpap vs Bipap, will continue efforts)  Kathleen Lime, MS [facility] SLP Acute Rehab Services Office [phone]  Macario Golds 06/22/2019, 7:31 AM

## 2019-06-22 NOTE — Telephone Encounter (Signed)
Attempted to contact patient with results on 3 separate occasions. Sending letter with results. AS, CMA

## 2019-06-22 NOTE — Progress Notes (Signed)
Pt continues to have trouble breathing is on heated HFNC on at 15-40 L, at FiO2 at 100%, if pt keeps on the O2 he can breathe decent but when he takes of the O2 he plumets to O2 sats in the 50-60's.  Pt also needs a nonrebreather when he takes of the O2.  Pt may need to be comfort care due to his current health, will continue to monitor, Thanks Arvella Nigh RN.

## 2019-06-22 NOTE — Progress Notes (Signed)
PROGRESS NOTE    Herbert Marquez  [id number] DOB: 05-Sep-1927 DOA: 06/11/2019 PCP: No primary care provider on file.   Brief Narrative:  86 year old with history of multiple CVAs with residual effect, HTN, DM2, CAD, paroxysmal A. fib, BPH, ataxia, COPD, CKD stage III AAA presented to the hospital with complaints of weakness and fever.  Patient was diagnosed with septic shock secondary to possible urinary tract infection or right lower extremity cellulitis.  Started on IV Rocephin and vancomycin. Intermittently patient has become progressively hypoxic and hypotensive. Initially required diuretics and then some fluid bolus.  Seen by speech and swallow recommended dysphagia 1 diet.  Despite of diuresis, bronchodilators, steroids and antibiotics respiratory symptoms continue to worsen requiring increasing oxygen requirement.  Pulmonary critical care was consulted.  Palliative was also consulted.  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   Cerebrovascular accident (CVA) ([place])   BPH (benign prostatic hyperplasia)   GERD (gastroesophageal reflux disease)   CKD (chronic kidney disease), stage II   Coronary artery disease involving native coronary artery of native heart without angina pectoris   PAF (paroxysmal atrial fibrillation) (HCC)   Dementia without behavioral disturbance (HCC)   Vascular dementia with behavior disturbance (HCC)   Hemiparesis and other late effects of cerebrovascular accident ([place])   Seizures ([place])   Presence of permanent cardiac pacemaker   Cellulitis of right leg   Acute respiratory failure with hypoxia ([place])   Acute pulmonary edema (HCC)  Septic shock secondary to right lower extremity cellulitis Possible healthcare acquired  pneumonia -Shock physiology has resolved but patient continues to be severely dyspneic.  Continue doxycycline and Keflex at this time.  Acute hypoxic respiratory failure secondary to fluid overload Pulmonary edema  Possible Underlying COPD exac; Hx of COPD Pulmonary artery hypertension -Respiration symptoms continues to worsen, will use heated high flow and BiPAP as necessary for respiratory support. -Continue bronchodilators. -Already on doxycycline and Keflex. -Bronchodilators 3 times daily, incentive spirometer, Pulmicort twice daily -Diuresis as able -Consult pulmonary for their input on this. -Ideally will get CT chest to get better look at his parenchyma but given increase amount of supplemental oxygen, would hold off on this for now -Procalcitonin 0.19 -COVID-19-negative -Echocardiogram - ef 60%, G1DD. Elevated PASP -Low suspicion for PE as patient is already on Eliquis -S&S eval= Dys I Diet.  Modified barium swallow  Goals of care discussion -Myself and pulmonary team had goals of care discussion with the patient's wife.  She is reasonably upset and tearful but tells me she does not want to see her husband in any suffering.  She is okay with initiating some comfort measures at this time.  Palliative care team has been consulted.  She understands that his overall poor prognosis  Delirium, intermittent -Intermittent delirium.  This morning is awake alert oriented X2.  CKD stage IIIa -Creatinine around baseline.  History of CVA -On aspirin, statin, Eliquis  GERD -PPI  Paroxysmal atrial fibrillation -Paced rhythm.  Continue Eliquis  History of seizures -Keppra 750 mg twice daily  Prolonged QTC -Continue Seroquel.  QTC is improved, 503.  Constipation -Bowel regimen in place.  Out of bed to chair  Unfortunately patient has quite poor prognosis at this time.  DVT prophylaxis: Eliquis Code Status: DNR Family Communication:  Wife at bedside Disposition Plan:    Patient From= home  Patient Anticipated D/C place= to be determined  Barriers= significantly hypoxic requiring supplemental oxygen.  Unsafe for discharge  Subjective: Patient has progressively become short of breath requiring heated high flow and intermittent BiPAP.  This morning when seen and examined at bedside he was seen distress secondary to shortness of breath but requested to take off his BiPAP for a short time to give him a break.  During that time he desaturated down to 54% with good wavelength on the monitor.  Spoke extensively regarding goals of care discussion with the patient's wife, she is tearful and does not want to see her husband suffer.  She understands that he may continue to decline and agreeable to make him feel as comfortable as possible and speak with palliative care service.  Review of Systems Otherwise negative except as per HPI, including: General: Denies fever, chills, night sweats or unintended weight loss. Resp: Denies hemoptysis Cardiac: Denies chest pain, palpitations, orthopnea, paroxysmal nocturnal dyspnea. GI: Denies abdominal pain, nausea, vomiting, diarrhea or constipation GU: Denies dysuria, frequency, hesitancy or incontinence MS: Denies muscle aches, joint pain or swelling Neuro: Denies headache, neurologic deficits (focal weakness, numbness, tingling), abnormal gait Psych: Denies anxiety, depression, SI/HI/AVH Skin: Denies new rashes or lesions ID: Denies sick contacts, exotic exposures, travel  Examination: Constitutional: Patient is very ill-appearing, on heated high flow this morning desaturating down to 54%. Respiratory: Diffuse rhonchorous breath sounds Cardiovascular: Sinus tachycardia Abdomen: Nontender nondistended good bowel sounds Musculoskeletal: No edema noted Skin: No rashes seen Neurologic: No focal neuro deficits.  Grossly moving all the extremities. Psychiatric: Poor judgment and insight.  Alert to name  Objective: Vitals:    06/22/19 0630 06/22/19 0824 06/22/19 0825 06/22/19 1013  BP:      Pulse: 81  88   Resp: (!) 26  (!) 29   Temp:      TempSrc:      SpO2: 98% 98% 98% 99%  Weight:      Height:        Intake/Output Summary (Last 24 hours) at 06/22/2019 1112 Last data filed at 06/22/2019 0100 Gross per 24 hour  Intake 270 ml  Output 750 ml  Net -480 ml   Filed Weights   06/20/19 0038 06/21/19 0630 06/22/19 0153  Weight: 72.8 kg 71.8 kg 68.6 kg     Data Reviewed:   CBC: Recent Labs  Lab 06/24/2019 1852 06/16/19 0506 06/18/19 0558 06/19/19 0428 06/20/19 0443 06/21/19 0703 06/22/19 0805  WBC 17.6*   < > 13.3* 14.9* 16.7* 24.0* 25.5*  NEUTROABS 14.9*  --   --   --   --   --   --   HGB 15.3   < > 12.2* 10.9* 12.6* 12.5* 12.8*  HCT 45.9   < > 37.7* 33.5* 38.7* 38.3* 39.7  MCV 92.2   < > 95.7 94.4 93.3 94.1 95.7  PLT 404*   < > 334 342 417* 464* 487*   < > = values in this interval not displayed.   Basic Metabolic Panel: Recent Labs  Lab 06/18/19 0558 06/19/19 0428 06/20/19 0443 06/21/19 0703 06/22/19 0805  NA 135 138 139 141 147*  K 3.8 3.3* 4.1 3.6 3.5  CL 104 107 107 108 106  CO2 21* 22 20* 23 29  GLUCOSE 137* 111* 168* 164* 121*  BUN 17 19 19  29* 35*  CREATININE 1.18 0.97 1.04 1.00 1.06  CALCIUM 7.6* 8.0* 8.8* 8.8* 8.8*  MG 1.8 1.8 2.1 2.1 2.2   GFR: Estimated Creatinine Clearance: 44.2 mL/min (by C-G formula based on SCr of 1.06 mg/dL). Liver Function Tests: Recent Labs  Lab 06/08/2019 1852 06/16/19 0506  AST 53* 44*  ALT 19 13  ALKPHOS 73 58  BILITOT 0.6 1.1  PROT 6.6 4.9*  ALBUMIN 3.4* 2.4*   No results for input(s): LIPASE, AMYLASE in the last 168 hours. No results for input(s): AMMONIA in the last 168 hours. Coagulation Profile: Recent Labs  Lab 06/28/2019 1900 06/16/19 0506  INR 1.5* 1.7*   Cardiac Enzymes: No results for input(s): CKTOTAL, CKMB, CKMBINDEX, TROPONINI in the last 168 hours. BNP (last 3 results) No results for input(s): PROBNP in the  last 8760 hours. HbA1C: No results for input(s): HGBA1C in the last 72 hours. CBG: Recent Labs  Lab 06/21/19 0625 06/21/19 1349 06/21/19 1739 06/21/19 2249 06/22/19 0714  GLUCAP 158* 257* 164* 112* 115*   Lipid Profile: No results for input(s): CHOL, HDL, LDLCALC, TRIG, CHOLHDL, LDLDIRECT in the last 72 hours. Thyroid Function Tests: No results for input(s): TSH, T4TOTAL, FREET4, T3FREE, THYROIDAB in the last 72 hours. Anemia Panel: No results for input(s): VITAMINB12, FOLATE, FERRITIN, TIBC, IRON, RETICCTPCT in the last 72 hours. Sepsis Labs: Recent Labs  Lab 06/05/2019 1852 06/28/2019 2134 06/16/19 0506  PROCALCITON  --   --  0.19  LATICACIDVEN 2.1* 2.0* 0.9    Recent Results (from the past 240 hour(s))  Culture, blood (routine x 2)     Status: None   Collection Time: 06/04/2019  6:45 PM   Specimen: BLOOD  Result Value Ref Range Status   Specimen Description BLOOD BLOOD RIGHT WRIST  Final   Special Requests   Final    BOTTLES DRAWN AEROBIC AND ANAEROBIC Blood Culture adequate volume   Culture   Final    NO GROWTH 5 DAYS Performed at [facility], [place] [address], [place], [place] [address]    Report Status 06/20/2019 FINAL  Final  Culture, blood (routine x 2)     Status: None   Collection Time: 06/04/2019  6:45 PM   Specimen: BLOOD  Result Value Ref Range Status   Specimen Description BLOOD RIGHT UPPER ARM  Final   Special Requests   Final    BOTTLES DRAWN AEROBIC ONLY Blood Culture results may not be optimal due to an inadequate volume of blood received in culture bottles   Culture   Final    NO GROWTH 5 DAYS Performed at [facility], [place] [address], [place], [place] [address]    Report Status 06/20/2019 FINAL  Final  Respiratory Panel by RT PCR (Flu A&B, Covid) - Nasopharyngeal Swab     Status: None   Collection Time: 06/18/2019  7:34 PM   Specimen: Nasopharyngeal Swab  Result Value Ref Range Status   SARS Coronavirus 2 by RT PCR NEGATIVE NEGATIVE Final     Comment: (NOTE) SARS-CoV-2 target nucleic acids are NOT DETECTED. The SARS-CoV-2 RNA is generally detectable in upper respiratoy specimens during the acute phase of infection. The lowest concentration of SARS-CoV-2 viral copies this assay can detect is 131 copies/mL. A negative result does not preclude SARS-Cov-2 infection and should not be used as the sole basis for treatment or other patient management decisions. A negative result may occur with  improper specimen collection/handling, submission of specimen other than nasopharyngeal swab, presence of viral mutation(s) within the areas targeted by this assay, and inadequate number of viral copies (<131  copies/mL). A negative result must be combined with clinical observations, patient history, and epidemiological information. The expected result is Negative. Fact Sheet for Patients:  PinkCheek.be Fact Sheet for Healthcare Providers:  GravelBags.it This test is not yet ap proved or cleared by the [place] FDA and  has been authorized for detection and/or diagnosis of SARS-CoV-2 by FDA under an Emergency Use Authorization (EUA). This EUA will remain  in effect (meaning this test can be used) for the duration of the COVID-19 declaration under Section 564(b)(1) of the Act, 21 U.S.C. section 360bbb-3(b)(1), unless the authorization is terminated or revoked sooner.    Influenza A by PCR NEGATIVE NEGATIVE Final   Influenza B by PCR NEGATIVE NEGATIVE Final    Comment: (NOTE) The Xpert Xpress SARS-CoV-2/FLU/RSV assay is intended as an aid in  the diagnosis of influenza from Nasopharyngeal swab specimens and  should not be used as a sole basis for treatment. Nasal washings and  aspirates are unacceptable for Xpert Xpress SARS-CoV-2/FLU/RSV  testing. Fact Sheet for Patients: PinkCheek.be Fact Sheet for Healthcare  Providers: GravelBags.it This test is not yet approved or cleared by the [place] FDA and  has been authorized for detection and/or diagnosis of SARS-CoV-2 by  FDA under an Emergency Use Authorization (EUA). This EUA will remain  in effect (meaning this test can be used) for the duration of the  Covid-19 declaration under Section 564(b)(1) of the Act, 21  U.S.C. section 360bbb-3(b)(1), unless the authorization is  terminated or revoked. Performed at [facility], [place] [address] [place], [place] [address]   Urine culture     Status: Abnormal   Collection Time: 06/18/2019  7:50 PM   Specimen: Urine, Catheterized  Result Value Ref Range Status   Specimen Description URINE, CATHETERIZED  Final   Special Requests   Final    NONE Performed at [facility], [place] [address], [place], [place] [address]    Culture MULTIPLE SPECIES PRESENT, SUGGEST RECOLLECTION (A)  Final   Report Status 06/17/2019 FINAL  Final  MRSA PCR Screening     Status: None   Collection Time: 06/16/19 12:04 AM   Specimen: Nasal Mucosa; Nasopharyngeal  Result Value Ref Range Status   MRSA by PCR NEGATIVE NEGATIVE Final    Comment:        The GeneXpert MRSA Assay (FDA approved for NASAL specimens only), is one component of a comprehensive MRSA colonization surveillance program. It is not intended to diagnose MRSA infection nor to guide or monitor treatment for MRSA infections. Performed at [facility], [place] [address] [place], [place] [address]   Culture, blood (Routine X 2) w Reflex to ID Panel     Status: None   Collection Time: 06/16/19  6:20 PM   Specimen: BLOOD  Result Value Ref Range Status   Specimen Description BLOOD LEFT ANTECUBITAL  Final   Special Requests   Final    BOTTLES DRAWN AEROBIC ONLY Blood Culture results may not be optimal due to an inadequate volume of blood received in culture bottles   Culture   Final    NO GROWTH 5 DAYS Performed at  [facility], [place] [address], [place], [place] [address]    Report Status 06/21/2019 FINAL  Final  Culture, blood (Routine X 2) w Reflex to ID Panel     Status: None   Collection Time: 06/16/19  6:26 PM   Specimen: BLOOD LEFT HAND  Result Value Ref Range Status  Specimen Description BLOOD LEFT HAND  Final   Special Requests   Final    BOTTLES DRAWN AEROBIC AND ANAEROBIC Blood Culture adequate volume   Culture   Final    NO GROWTH 5 DAYS Performed at [facility], [address], [place], [place] [address]    Report Status 06/21/2019 FINAL  Final         Radiology Studies: DG CHEST PORT 1 VIEW  Result Date: 06/21/2019 CLINICAL DATA:  Hypoxia. EXAM: PORTABLE CHEST 1 VIEW COMPARISON:  June 18, 2019. FINDINGS: Stable cardiomegaly. Left-sided pacemaker is unchanged in position. No pneumothorax or pleural effusion is noted. Stable interstitial densities are noted throughout both lungs which may represent edema or atypical inflammation. Bony thorax is unremarkable. IMPRESSION: Stable cardiomegaly. Stable interstitial densities are noted throughout both lungs which may represent edema or atypical inflammation. Electronically Signed   By: Marijo Conception M.D.   On: 06/21/2019 10:10        Scheduled Meds: . apixaban  5 mg Oral BID  . aspirin EC  81 mg Oral QHS  . atorvastatin  80 mg Oral QHS  . budesonide (PULMICORT) nebulizer solution  0.5 mg Nebulization BID  . cephALEXin  500 mg Oral Q8H  . doxycycline  100 mg Oral Q12H  . feeding supplement (ENSURE ENLIVE)  237 mL Oral Q lunch  . insulin aspart  0-15 Units Subcutaneous TID WC  . insulin aspart  0-5 Units Subcutaneous QHS  . ipratropium-albuterol  3 mL Nebulization TID  . multivitamin with minerals  1 tablet Oral Q breakfast  . QUEtiapine  25 mg Oral QHS   Continuous Infusions: . sodium chloride 250 mL (06/18/19 1514)  . levETIRAcetam 750 mg (06/21/19 2253)  . potassium chloride    . sodium chloride Stopped  (06/18/19 0325)     LOS: 7 days   Time spent= 40 mins    Katty Fretwell Arsenio Loader, MD [facility]  If 7PM-7AM, please contact night-coverage  06/22/2019, 11:12 AM

## 2019-06-22 NOTE — Progress Notes (Signed)
Met with patient's daughter and wife at bedside.  Extensively went over patient's care as patient continues to decline.  At this time family is agreeable to transition patient to comfort care.  All the questions have been answered. MOST form filled out and placed in the chart.  Please call with any questions as necessary.  All the lab work, antibiotics and unnecessary medications have been discontinued at family's request.  Family very appreciative of patient's care.  Patient is not stable to be moved to hospice at this time therefore we will continue comfort measures and house.  Gerlean Ren MD [facility]

## 2019-06-23 ENCOUNTER — Inpatient Hospital Stay (HOSPITAL_COMMUNITY): Payer: Medicare Other

## 2019-06-23 DIAGNOSIS — I63132 Cerebral infarction due to embolism of left carotid artery: Secondary | ICD-10-CM

## 2019-06-23 MED ORDER — FUROSEMIDE 10 MG/ML IJ SOLN
40.0000 mg | Freq: Once | INTRAMUSCULAR | Status: AC
  Administered 2019-06-23: 40 mg via INTRAVENOUS
  Filled 2019-06-23: qty 4

## 2019-07-04 NOTE — Progress Notes (Signed)
Nutrition Brief Note  Chart reviewed. Pt now transitioning to comfort care.  No further nutrition interventions warranted at this time.  Please re-consult as needed.   Samanyu Tinnell W, RD, LDN, CDCES Registered Dietitian II Certified Diabetes Care and Education Specialist Please refer to AMION for RD and/or RD on-call/weekend/after hours pager  

## 2019-07-04 NOTE — Death Summary Note (Signed)
Death Summary  Herbert Marquez [id number] DOB: 1928/02/24 DOA: 07-13-19  PCP: No primary care provider on file.  Admit date: Jul 13, 2019 Date of Death: Jul 21, 2019 Time of Death: 10:40 AM Notification: No primary care provider on file. notified of death of Jul 21, 2019   History of present illness: 86 year old with history of multiple CVAs with residual effect, HTN, DM2, CAD, paroxysmal A. fib, BPH, ataxia, COPD, CKD stage III AAA presented to the hospital with complaints of weakness and fever.  Patient was diagnosed with septic shock secondary to possible urinary tract infection or right lower extremity cellulitis.  Started on IV Rocephin and vancomycin. Intermittently patient has become progressively hypoxic and hypotensive. Initially required diuretics and then some fluid bolus.  Seen by speech and swallow recommended dysphagia 1 diet.  Despite of diuresis, bronchodilators, steroids and antibiotics respiratory symptoms continue to worsen requiring increasing oxygen requirement.  Pulmonary critical care was consulted.    Despite of aggressive treatment during the hospitalization, his breathing continued to worsen.  After prolonged discussion with the family patient was transitioned to comfort care.  Eventually he ended up passing away on Jul 21, 2019 at 10:40 AM.  Family present at bedside along with patient's RN and myself.  Code Status= DNR   On physical exam: Patient was unresponsive to verbal and physical stimuli.  No gag reflex, pupillary reflex or corneal reflex was present.  Heart sounds and breath sounds were absent.  Final/Principal Diagnoses:  1.   Acute hypoxic and hypercarbic respiratory failure 2.  Healthcare associated pneumonia 3.  Right lower extremity cellulitis 4.  Septic shock secondary to #2 #3 5.  Diabetes mellitus type 2  Disposition/Follow up Care: Patient is deceased.   Discharge medications: None  The results of significant diagnostics from this  hospitalization (including imaging, microbiology, ancillary and laboratory) are listed below for reference.    Significant Diagnostic Studies: DG Chest 1 View  Result Date: 06/16/2019 CLINICAL DATA:  Cough.  Shortness of breath. EXAM: CHEST  1 VIEW COMPARISON:  10/04/2017.  CT 06/19/2015. FINDINGS: Cardiac pacer stable position. Cardiomegaly. No pulmonary venous congestion. Increase interstitial prominence noted bilaterally. Findings could represent interstitial edema/pneumonitis superimposed on known chronic interstitial lung disease. No pleural effusion or pneumothorax. No acute bony abnormality. IMPRESSION: 1. Cardiac pacer in stable position. Cardiomegaly. No pulmonary venous congestion. 2. Increase interstitial prominence noted bilaterally. Findings could represent interstitial edema/pneumonitis superimposed on known chronic interstitial lung disease. Electronically Signed   By: Marcello Moores  Register   On: 06/16/2019 06:56   DG CHEST PORT 1 VIEW  Result Date: 06/21/2019 CLINICAL DATA:  Hypoxia. EXAM: PORTABLE CHEST 1 VIEW COMPARISON:  June 18, 2019. FINDINGS: Stable cardiomegaly. Left-sided pacemaker is unchanged in position. No pneumothorax or pleural effusion is noted. Stable interstitial densities are noted throughout both lungs which may represent edema or atypical inflammation. Bony thorax is unremarkable. IMPRESSION: Stable cardiomegaly. Stable interstitial densities are noted throughout both lungs which may represent edema or atypical inflammation. Electronically Signed   By: Marijo Conception M.D.   On: 06/21/2019 10:10   DG Chest Port 1 View  Result Date: 06/18/2019 CLINICAL DATA:  Dyspnea, history stroke, COPD, hypertension, coronary artery disease, dementia, paroxysmal atrial fibrillation, type II diabetes mellitus EXAM: PORTABLE CHEST 1 VIEW COMPARISON:  Portable exam 0828 hours compared to 06/17/2019 FINDINGS: Enlargement of cardiac silhouette with stable LEFT subclavian pacemaker leads.  Atherosclerotic calcification aorta. Diffuse interstitial infiltrates throughout both lungs similar to prior exam. When compared to earlier studies appears represent a combination of underlying chronic  interstitial lung disease/fibrosis and superimposed acute interstitial infiltrate. No pleural effusion or pneumothorax. Osseous demineralization. IMPRESSION: Persistent diffuse BILATERAL interstitial infiltrates, which could represent pulmonary edema or atypical infection. Electronically Signed   By: Lavonia Dana M.D.   On: 06/18/2019 08:39   DG CHEST PORT 1 VIEW  Result Date: 06/17/2019 CLINICAL DATA:  Hypoxia EXAM: PORTABLE CHEST 1 VIEW COMPARISON:  06/29/2019 FINDINGS: Single frontal view of the chest was performed. Evaluation limited by patient positioning and cooperation. Dual lead pacemaker unchanged. Cardiac silhouette is stable given differences in technique and positioning. Diffuse background scarring and fibrosis again noted. Overall, increasing consolidation is seen at the lung bases, right greater than left. No pneumothorax. No large effusion. IMPRESSION: 1. Increasing basilar consolidation superimposed upon background fibrosis. This could reflect edema or infection. Electronically Signed   By: Randa Ngo M.D.   On: 06/17/2019 01:51   DG Chest Port 1 View  Result Date: 06/28/2019 CLINICAL DATA:  Short of breath EXAM: PORTABLE CHEST 1 VIEW COMPARISON:  10/04/2017 FINDINGS: Single frontal view of the chest demonstrates dual lead pacemaker unchanged. Cardiac silhouette is stable. There is progressive scarring and fibrosis throughout the lungs, without acute airspace disease, effusion, or pneumothorax. No acute bony abnormalities. IMPRESSION: 1. Progressive scarring and fibrosis. No superimposed airspace disease. Electronically Signed   By: Randa Ngo M.D.   On: 06/21/2019 19:18   DG Swallowing Func-Speech Pathology  Result Date: 06/19/2019 = Objective Swallowing Evaluation: Type of Study:  MBS-Modified Barium Swallow Study  Patient Details Name: GEROME HAYSLETT MRN: [id number] Date of Birth: Oct 02, 1927 Today's Date: 06/19/2019 Time: SLP Start Time (ACUTE ONLY): [id number] -SLP Stop Time (ACUTE ONLY): 0852 SLP Time Calculation (min) (ACUTE ONLY): 14 min Past Medical History: Past Medical History: Diagnosis Date . Acute blood loss anemia  . Acute encephalopathy 06/04/2016 . Acute ischemic stroke ([place]) - L temporal lobe s/p tPA 03/30/2016 . Altered mental status  . Arthritis   "pretty much all over"  . 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 Atrial fibrillation with rapid ventricular response ([place])  . Basal cell carcinoma   "several burned off his body, face, head" . Benign essential HTN  . BPH (benign prostatic hypertrophy)  . Cerebral hemorrhage (HCC) w/ SDH s/p IV tPA  . Cerebral thrombosis with cerebral infarction 10/05/2017 . Cerebrovascular accident (CVA) ([place]) 09/18/2015 . Chronic anticoagulation  . CKD (chronic kidney disease), stage II 11/25/2015 . COPD (chronic obstructive pulmonary disease) ([place]) 03/09/2011 . Coronary artery disease   a. s/p PCI of RCA in 2006 . Coronary artery disease involving native coronary artery of native heart without angina pectoris  . CVA (cerebral infarction)   a. 06/2015: left thalamic and bilateral PCA . Dementia without behavioral disturbance ([place])  . Dysphagia as late effect of cerebrovascular disease  . Embolic stroke ([place]) [id number] . Expressive aphasia 10/04/2017 . Gait disturbance, post-stroke 06/23/2015 . GERD (gastroesophageal reflux disease)  . Hemiparesis and other late effects of cerebrovascular accident ([place]) 06/29/2016 . History of stroke 04/04/2016 . HLD (hyperlipidemia)  . Hyperlipidemia  . Hypertension  . Ischemic stroke ([place])  . Low blood pressure reading 10/14/2017 . Orthostatic hypotension  . PAF (paroxysmal atrial fibrillation) ([place])  . Presence of permanent cardiac pacemaker  . Right hemiparesis ([place])  . Second degree Mobitz II AV block 10/23/2016 . Seizures ([place])  .  Stroke ([place])  . Thalamic infarction ([place]) 06/21/2015 . TIA (transient ischemic attack)   Approximately 6 weeks post-cardiac catheterization.  . Type 2 diabetes mellitus with complication,  without long-term current use of insulin ([place])  . Vascular dementia with behavior disturbance ([place]) 06/29/2016 Past Surgical History: Past Surgical History: Procedure Laterality Date . CARDIOVASCULAR STRESS TEST  07/01/2007  EF 74% . CATARACT EXTRACTION, BILATERAL   . CORONARY ANGIOPLASTY WITH STENT PLACEMENT  10/2004  stenting x 2 to RCA . FEMUR IM NAIL Right 11/26/2015  Procedure: INTRAMEDULLARY RIGHT (IM) NAIL FEMORAL;  Surgeon: Rod Can, MD;  Location: [facility];  Service: Orthopedics;  Laterality: Right; . FRACTURE SURGERY   . HERNIA REPAIR   . HIP ARTHROPLASTY  03/09/2011  Procedure: ARTHROPLASTY BIPOLAR HIP;  Surgeon: Mauri Pole;  Location: [facility];  Service: Orthopedics;  Laterality: Left; . INSERT / REPLACE / REMOVE PACEMAKER  10/23/2016 . LAPAROSCOPIC INCISIONAL / UMBILICAL / VENTRAL HERNIA REPAIR    "below his naval" . PACEMAKER IMPLANT N/A 10/23/2016  Procedure: Pacemaker Implant;  Surgeon: Thompson Grayer, MD;  Location: [facility];  Service: Cardiovascular;  Laterality: N/A; HPI: Pt is a 86 y.o. male with medical history significant of multiple CVAs with residual effects, hypertension, diabetes, coronary artery disease, paroxysmal atrial fibrillation, BPH, ataxia, COPD, chronic kidney disease stage III who was brought in by his wife secondary to weakness and fever. CXR of 4/15: Persistent diffuse BILATERAL interstitial infiltrates, which could represent pulmonary edema or atypical infection. WBC elevated and pt afebrile at time of eval.  No data recorded Assessment / Plan / Recommendation CHL IP CLINICAL IMPRESSIONS 06/19/2019 Clinical Impression Pt presents with likely acute on chronic oropahryngeal dysphagia considering the reports of the pt's wife. His swallow was marked by prolonged mastication, reduced bolus  cohesion, impaired bolus formation, reduced lingual retraction, and a pharyngeal delay. He demonstrated lingual pumping, mild vallecular residue within normal range for age, and inconsistent penetration (PAS 3) of thin liquids. No instances of aspiration were noted but is likely with consecutive swallows. It is recommended that the pt's diet of dysphagia 1 (puree) solids with thin liquids be continued at this time with strict observance of swallowing precautiosn to reduce risk of laryngeal invasion and ultimate aspiration. SLP will follow continue to follow pt.  SLP Visit Diagnosis Dysphagia, oropharyngeal phase (R13.12) Attention and concentration deficit following -- Frontal lobe and executive function deficit following -- Impact on safety and function Mild aspiration risk;Moderate aspiration risk   CHL IP TREATMENT RECOMMENDATION 06/19/2019 Treatment Recommendations Therapy as outlined in treatment plan below   Prognosis 06/19/2019 Prognosis for Safe Diet Advancement Fair Barriers to Reach Goals Cognitive deficits;Time post onset Barriers/Prognosis Comment -- CHL IP DIET RECOMMENDATION 06/19/2019 SLP Diet Recommendations Dysphagia 1 (Puree) solids;Thin liquid Liquid Administration via Cup;No straw Medication Administration Crushed with puree Compensations Minimize environmental distractions;Slow rate;Small sips/bites;Follow solids with liquid Postural Changes Remain semi-upright after after feeds/meals (Comment);Seated upright at 90 degrees   CHL IP OTHER RECOMMENDATIONS 06/19/2019 Recommended Consults -- Oral Care Recommendations Oral care BID;Oral care before and after PO Other Recommendations --   CHL IP FOLLOW UP RECOMMENDATIONS 06/19/2019 Follow up Recommendations (No Data)   CHL IP FREQUENCY AND DURATION 06/19/2019 Speech Therapy Frequency (ACUTE ONLY) min 2x/week Treatment Duration 2 weeks      CHL IP ORAL PHASE 06/19/2019 Oral Phase Impaired Oral - Pudding Teaspoon -- Oral - Pudding Cup -- Oral - Honey Teaspoon  -- Oral - Honey Cup -- Oral - Nectar Teaspoon -- Oral - Nectar Cup -- Oral - Nectar Straw Lingual pumping;Reduced posterior propulsion Oral - Thin Teaspoon -- Oral - Thin Cup Lingual pumping;Reduced posterior propulsion Oral -  Thin Straw Lingual pumping;Reduced posterior propulsion Oral - Puree Lingual pumping;Decreased bolus cohesion;Reduced posterior propulsion Oral - Mech Soft -- Oral - Regular Impaired mastication;Decreased bolus cohesion Oral - Multi-Consistency -- Oral - Pill Impaired mastication Oral Phase - Comment --  CHL IP PHARYNGEAL PHASE 06/19/2019 Pharyngeal Phase Impaired Pharyngeal- Pudding Teaspoon -- Pharyngeal -- Pharyngeal- Pudding Cup -- Pharyngeal -- Pharyngeal- Honey Teaspoon -- Pharyngeal -- Pharyngeal- Honey Cup -- Pharyngeal -- Pharyngeal- Nectar Teaspoon -- Pharyngeal -- Pharyngeal- Nectar Cup -- Pharyngeal -- Pharyngeal- Nectar Straw Pharyngeal residue - valleculae;Delayed swallow initiation-vallecula Pharyngeal -- Pharyngeal- Thin Teaspoon -- Pharyngeal -- Pharyngeal- Thin Cup Delayed swallow initiation-pyriform sinuses;Pharyngeal residue - valleculae Pharyngeal Material does not enter airway Pharyngeal- Thin Straw Delayed swallow initiation-vallecula;Delayed swallow initiation-pyriform sinuses;Penetration/Aspiration during swallow Pharyngeal Material enters airway, remains ABOVE vocal cords and not ejected out Pharyngeal- Puree Pharyngeal residue - valleculae Pharyngeal -- Pharyngeal- Mechanical Soft -- Pharyngeal -- Pharyngeal- Regular Pharyngeal residue - valleculae Pharyngeal -- Pharyngeal- Multi-consistency -- Pharyngeal -- Pharyngeal- Pill Pharyngeal residue - valleculae Pharyngeal -- Pharyngeal Comment --  CHL IP CERVICAL ESOPHAGEAL PHASE 06/19/2019 Cervical Esophageal Phase WFL Pudding Teaspoon -- Pudding Cup -- Honey Teaspoon -- Honey Cup -- Nectar Teaspoon -- Nectar Cup -- Nectar Straw -- Thin Teaspoon -- Thin Cup -- Thin Straw -- Puree -- Mechanical Soft -- Regular --  Multi-consistency -- Pill -- Cervical Esophageal Comment -- Shanika I. Hardin Negus, [place], [place] Office number [phone] Pager [phone] Horton Marshall 06/19/2019, 9:53 AM              ECHOCARDIOGRAM COMPLETE  Result Date: 06/18/2019    ECHOCARDIOGRAM REPORT   Patient Name:   MUHAMMADALI WEICK Date of Exam: 06/18/2019 Medical Rec #:  [id number]       Height:       67.0 in Accession #:    [id number]      Weight:       148.1 lb Date of Birth:  1927/06/10       BSA:          1.780 m Patient Age:    34 years        BP:           124/72 mmHg Patient Gender: M               HR:           79 bpm. Exam Location:  Inpatient Procedure: 2D Echo Indications:    cardiomyopathy 425.9  History:        Patient has prior history of Echocardiogram examinations, most                 recent 10/06/2017. CAD, COPD and chronic kidney disease. history                 of strokes.; Risk Factors:Diabetes and Hypertension.  Sonographer:    Johny Chess Referring Phys: [id number] Knox Cervi CHIRAG Sheyenne Konz IMPRESSIONS  1. Left ventricular ejection fraction, by estimation, is 60 to 65%. The left ventricle has normal function. The left ventricle has no regional wall motion abnormalities. Left ventricular diastolic parameters are consistent with Grade I diastolic dysfunction (impaired relaxation).  2. Right ventricular systolic function is normal. The right ventricular size is mildly enlarged. There is moderately elevated pulmonary artery systolic pressure. The estimated right ventricular systolic pressure is 123XX123 mmHg.  3. The mitral valve is normal in structure. Mild mitral valve regurgitation. No evidence of mitral stenosis.  4. Tricuspid valve regurgitation is  moderate.  5. The aortic valve is normal in structure. Aortic valve regurgitation is not visualized. No aortic stenosis is present.  6. The inferior vena cava is normal in size with greater than 50% respiratory variability, suggesting right atrial pressure of 3  mmHg. FINDINGS  Left Ventricle: Left ventricular ejection fraction, by estimation, is 60 to 65%. The left ventricle has normal function. The left ventricle has no regional wall motion abnormalities. The left ventricular internal cavity size was normal in size. There is  no left ventricular hypertrophy. Left ventricular diastolic parameters are consistent with Grade I diastolic dysfunction (impaired relaxation). Normal left ventricular filling pressure. Right Ventricle: The right ventricular size is mildly enlarged. No increase in right ventricular wall thickness. Right ventricular systolic function is normal. There is moderately elevated pulmonary artery systolic pressure. The tricuspid regurgitant velocity is 3.33 m/s, and with an assumed right atrial pressure of 3 mmHg, the estimated right ventricular systolic pressure is 123XX123 mmHg. Left Atrium: Left atrial size was normal in size. Right Atrium: Right atrial size was normal in size. Pericardium: There is no evidence of pericardial effusion. Mitral Valve: The mitral valve is normal in structure. Normal mobility of the mitral valve leaflets. Mild mitral valve regurgitation. No evidence of mitral valve stenosis. Tricuspid Valve: The tricuspid valve is normal in structure. Tricuspid valve regurgitation is moderate . No evidence of tricuspid stenosis. Aortic Valve: The aortic valve is normal in structure.. There is moderate thickening and moderate calcification of the aortic valve. Aortic valve regurgitation is not visualized. No aortic stenosis is present. There is moderate thickening of the aortic valve. There is moderate calcification of the aortic valve. Pulmonic Valve: The pulmonic valve was normal in structure. Pulmonic valve regurgitation is not visualized. No evidence of pulmonic stenosis. Aorta: The aortic root is normal in size and structure. Venous: The inferior vena cava is normal in size with greater than 50% respiratory variability, suggesting right  atrial pressure of 3 mmHg. IAS/Shunts: No atrial level shunt detected by color flow Doppler.  LEFT VENTRICLE PLAX 2D LVIDd:         4.90 cm  Diastology LVIDs:         3.40 cm  LV e' lateral:   9.36 cm/s LV PW:         0.90 cm  LV E/e' lateral: 5.4 LV IVS:        0.70 cm  LV e' medial:    5.55 cm/s LVOT diam:     1.80 cm  LV E/e' medial:  9.1 LV SV:         43 LV SV Index:   24 LVOT Area:     2.54 cm  RIGHT VENTRICLE RV S prime:     10.30 cm/s TAPSE (M-mode): 1.5 cm LEFT ATRIUM             Index LA diam:        2.80 cm 1.57 cm/m LA Vol (A2C):   36.3 ml 20.39 ml/m LA Vol (A4C):   58.8 ml 33.03 ml/m LA Biplane Vol: 50.9 ml 28.59 ml/m  AORTIC VALVE LVOT Vmax:   85.70 cm/s LVOT Vmean:  53.600 cm/s LVOT VTI:    0.168 m  AORTA Ao Root diam: 3.50 cm MITRAL VALVE               TRICUSPID VALVE MV Area (PHT): 3.12 cm    TR Peak grad:   44.4 mmHg MV Decel Time: 243 msec    TR Vmax:  333.00 cm/s MV E velocity: 50.40 cm/s MV A velocity: 77.60 cm/s  SHUNTS MV E/A ratio:  0.65        Systemic VTI:  0.17 m                            Systemic Diam: 1.80 cm Ena Dawley MD Electronically signed by Ena Dawley MD Signature Date/Time: 06/18/2019/7:36:55 PM    Final    CUP PACEART REMOTE DEVICE CHECK  Result Date: 06/09/2019 Scheduled remote reviewed. Normal device function.  AF burden 2.5%, longest episode 24hrs 3 VHR episodes, short bursts AF/RVR as seen in previous reports, longest <2 min. [place]- Eliquis Next remote 91 days- JBox, RN/CVRS  VAS [place] LOWER EXTREMITY VENOUS (DVT) (ONLY [facility] & [facility])  Result Date: 06/16/2019  Lower Venous DVTStudy Indications: Swelling, and Edema.  Comparison Study: no prior Performing Technologist: Abram Sander RVS  Examination Guidelines: A complete evaluation includes B-mode imaging, spectral Doppler, color Doppler, and power Doppler as needed of all accessible portions of each vessel. Bilateral testing is considered an integral part of a complete examination. Limited examinations  for reoccurring indications may be performed as noted. The reflux portion of the exam is performed with the patient in reverse Trendelenburg.  +---------+---------------+---------+-----------+----------+--------------+ RIGHT    CompressibilityPhasicitySpontaneityPropertiesThrombus Aging +---------+---------------+---------+-----------+----------+--------------+ CFV      Full           Yes      Yes                                 +---------+---------------+---------+-----------+----------+--------------+ SFJ      Full                                                        +---------+---------------+---------+-----------+----------+--------------+ FV Prox  Full                                                        +---------+---------------+---------+-----------+----------+--------------+ FV Mid   Full                                                        +---------+---------------+---------+-----------+----------+--------------+ FV DistalFull                                                        +---------+---------------+---------+-----------+----------+--------------+ PFV      Full                                                        +---------+---------------+---------+-----------+----------+--------------+ POP      Full  Yes      Yes                  limited vis    +---------+---------------+---------+-----------+----------+--------------+ PTV      Full                                                        +---------+---------------+---------+-----------+----------+--------------+ PERO                                                  Not visualized +---------+---------------+---------+-----------+----------+--------------+     Summary: RIGHT: - There is no evidence of deep vein thrombosis in the lower extremity.  - No cystic structure found in the popliteal fossa.   *See table(s) above for measurements and observations.  Electronically signed by Deitra Mayo MD on 06/16/2019 at 3:21:27 PM.    Final     Microbiology: Recent Results (from the past 240 hour(s))  Culture, blood (routine x 2)     Status: None   Collection Time: 06/21/2019  6:45 PM   Specimen: BLOOD  Result Value Ref Range Status   Specimen Description BLOOD BLOOD RIGHT WRIST  Final   Special Requests   Final    BOTTLES DRAWN AEROBIC AND ANAEROBIC Blood Culture adequate volume   Culture   Final    NO GROWTH 5 DAYS Performed at [facility], [address], [place], [place] [address]    Report Status 06/20/2019 FINAL  Final  Culture, blood (routine x 2)     Status: None   Collection Time: 06/05/2019  6:45 PM   Specimen: BLOOD  Result Value Ref Range Status   Specimen Description BLOOD RIGHT UPPER ARM  Final   Special Requests   Final    BOTTLES DRAWN AEROBIC ONLY Blood Culture results may not be optimal due to an inadequate volume of blood received in culture bottles   Culture   Final    NO GROWTH 5 DAYS Performed at [facility], [place] [address], [place], [place] [address]    Report Status 06/20/2019 FINAL  Final  Respiratory Panel by RT PCR (Flu A&B, Covid) - Nasopharyngeal Swab     Status: None   Collection Time: 06/05/2019  7:34 PM   Specimen: Nasopharyngeal Swab  Result Value Ref Range Status   SARS Coronavirus 2 by RT PCR NEGATIVE NEGATIVE Final    Comment: (NOTE) SARS-CoV-2 target nucleic acids are NOT DETECTED. The SARS-CoV-2 RNA is generally detectable in upper respiratoy specimens during the acute phase of infection. The lowest concentration of SARS-CoV-2 viral copies this assay can detect is 131 copies/mL. A negative result does not preclude SARS-Cov-2 infection and should not be used as the sole basis for treatment or other patient management decisions. A negative result may occur with  improper specimen collection/handling, submission of specimen other than nasopharyngeal swab, presence of viral mutation(s)  within the areas targeted by this assay, and inadequate number of viral copies (<131 copies/mL). A negative result must be combined with clinical observations, patient history, and epidemiological information. The expected result is Negative. Fact Sheet for Patients:  PinkCheek.be Fact Sheet for Healthcare Providers:  GravelBags.it This test is not  yet ap proved or cleared by the [place] and  has been authorized for detection and/or diagnosis of SARS-CoV-2 by FDA under an Emergency Use Authorization (EUA). This EUA will remain  in effect (meaning this test can be used) for the duration of the COVID-19 declaration under Section 564(b)(1) of the Act, 21 U.S.C. section 360bbb-3(b)(1), unless the authorization is terminated or revoked sooner.    Influenza A by PCR NEGATIVE NEGATIVE Final   Influenza B by PCR NEGATIVE NEGATIVE Final    Comment: (NOTE) The Xpert Xpress SARS-CoV-2/FLU/RSV assay is intended as an aid in  the diagnosis of influenza from Nasopharyngeal swab specimens and  should not be used as a sole basis for treatment. Nasal washings and  aspirates are unacceptable for Xpert Xpress SARS-CoV-2/FLU/RSV  testing. Fact Sheet for Patients: PinkCheek.be Fact Sheet for Healthcare Providers: GravelBags.it This test is not yet approved or cleared by the [place] FDA and  has been authorized for detection and/or diagnosis of SARS-CoV-2 by  FDA under an Emergency Use Authorization (EUA). This EUA will remain  in effect (meaning this test can be used) for the duration of the  Covid-19 declaration under Section 564(b)(1) of the Act, 21  U.S.C. section 360bbb-3(b)(1), unless the authorization is  terminated or revoked. Performed at [facility], [place] [address] [place], [place] [address]   Urine culture     Status: Abnormal   Collection Time:  06/19/2019  7:50 PM   Specimen: Urine, Catheterized  Result Value Ref Range Status   Specimen Description URINE, CATHETERIZED  Final   Special Requests   Final    NONE Performed at [facility], [place] [address] [place], [place] [address]    Culture MULTIPLE SPECIES PRESENT, SUGGEST RECOLLECTION (A)  Final   Report Status 06/17/2019 FINAL  Final  MRSA PCR Screening     Status: None   Collection Time: 06/16/19 12:04 AM   Specimen: Nasal Mucosa; Nasopharyngeal  Result Value Ref Range Status   MRSA by PCR NEGATIVE NEGATIVE Final    Comment:        The GeneXpert MRSA Assay (FDA approved for NASAL specimens only), is one component of a comprehensive MRSA colonization surveillance program. It is not intended to diagnose MRSA infection nor to guide or monitor treatment for MRSA infections. Performed at [facility], [place] [address] [place], [place] [address]   Culture, blood (Routine X 2) w Reflex to ID Panel     Status: None   Collection Time: 06/16/19  6:20 PM   Specimen: BLOOD  Result Value Ref Range Status   Specimen Description BLOOD LEFT ANTECUBITAL  Final   Special Requests   Final    BOTTLES DRAWN AEROBIC ONLY Blood Culture results may not be optimal due to an inadequate volume of blood received in culture bottles   Culture   Final    NO GROWTH 5 DAYS Performed at [facility], [place] [address], [place], [place] [address]    Report Status 06/21/2019 FINAL  Final  Culture, blood (Routine X 2) w Reflex to ID Panel     Status: None   Collection Time: 06/16/19  6:26 PM   Specimen: BLOOD LEFT HAND  Result Value Ref Range Status   Specimen Description BLOOD LEFT HAND  Final   Special Requests   Final    BOTTLES DRAWN AEROBIC AND ANAEROBIC Blood Culture adequate volume   Culture   Final    NO  GROWTH 5 DAYS Performed at [facility], [place] [address] [place], [place] [address]    Report Status 06/21/2019 FINAL  Final     Labs: Basic Metabolic  Panel: Recent Labs  Lab 06/18/19 0558 06/18/19 0558 06/19/19 0428 06/19/19 0428 06/20/19 0443 06/20/19 0443 06/21/19 0703 06/22/19 0805  NA 135  --  138  --  139  --  141 147*  K 3.8   < > 3.3*   < > 4.1   < > 3.6 3.5  CL 104  --  107  --  107  --  108 106  CO2 21*  --  22  --  20*  --  23 29  GLUCOSE 137*  --  111*  --  168*  --  164* 121*  BUN 17  --  19  --  19  --  29* 35*  CREATININE 1.18  --  0.97  --  1.04  --  1.00 1.06  CALCIUM 7.6*  --  8.0*  --  8.8*  --  8.8* 8.8*  MG 1.8  --  1.8  --  2.1  --  2.1 2.2   < > = values in this interval not displayed.   Liver Function Tests: No results for input(s): AST, ALT, ALKPHOS, BILITOT, PROT, ALBUMIN in the last 168 hours. No results for input(s): LIPASE, AMYLASE in the last 168 hours. No results for input(s): AMMONIA in the last 168 hours. CBC: Recent Labs  Lab 06/18/19 0558 06/19/19 0428 06/20/19 0443 06/21/19 0703 06/22/19 0805  WBC 13.3* 14.9* 16.7* 24.0* 25.5*  HGB 12.2* 10.9* 12.6* 12.5* 12.8*  HCT 37.7* 33.5* 38.7* 38.3* 39.7  MCV 95.7 94.4 93.3 94.1 95.7  PLT 334 342 417* 464* 487*   Cardiac Enzymes: No results for input(s): CKTOTAL, CKMB, CKMBINDEX, TROPONINI in the last 168 hours. D-Dimer No results for input(s): DDIMER in the last 72 hours. BNP: Invalid input(s): POCBNP CBG: Recent Labs  Lab 06/21/19 0625 06/21/19 1349 06/21/19 1739 06/21/19 2249 06/22/19 0714  GLUCAP 158* 257* 164* 112* 115*   Anemia work up No results for input(s): VITAMINB12, FOLATE, FERRITIN, TIBC, IRON, RETICCTPCT in the last 72 hours. Urinalysis    Component Value Date/Time   COLORURINE AMBER (A) 06/30/2019 1944   APPEARANCEUR HAZY (A) 07/02/2019 1944   LABSPEC 1.020 06/12/2019 1944   PHURINE 5.0 06/24/2019 1944   GLUCOSEU NEGATIVE 07/01/2019 1944   HGBUR NEGATIVE 06/17/2019 1944   BILIRUBINUR NEGATIVE 06/25/2019 1944   BILIRUBINUR neg 05/26/2019 1146   KETONESUR NEGATIVE 06/10/2019 1944   PROTEINUR 30 (A)  06/19/2019 1944   UROBILINOGEN 0.2 05/26/2019 1146   UROBILINOGEN 0.2 08/31/2011 1033   NITRITE NEGATIVE 06/20/2019 1944   LEUKOCYTESUR TRACE (A) 06/17/2019 1944   Sepsis Labs Invalid input(s): PROCALCITONIN,  WBC,  LACTICIDVEN  I have spent 35 minutes face to face encounter with the patient and on the ward discussing the patients care, assessment, plan and disposition with other care givers. >50% of the time was devoted  coordinating care.    SIGNED:  Damita Lack, MD  [facility] 2019/07/03, 11:46 AM Pager   If 7PM-7AM, please contact night-coverage [email or web address] Password [email or web address]

## 2019-07-04 NOTE — Progress Notes (Signed)
Dr. Reesa Chew at bedside with family and RN. Time of death pronounced at 1040

## 2019-07-04 NOTE — Progress Notes (Signed)
Post mortem care complete. Flow sheet checklist complete and [place] [facility] contacted as noted in the flow sheet. Family at bedside during [place] and MD assessment of cardiac death at 29. Family given time with the patient and notified that we will take care of post mortem care and contacting funeral home.

## 2019-07-04 NOTE — Progress Notes (Signed)
Pt's family stated he had been pulling on his gown and "fiddling with things". Ativan given for comfort at this time.

## 2019-07-04 NOTE — Progress Notes (Signed)
SLP Cancellation Note  Patient Details Name: Herbert Marquez MRN: [id number] DOB: 1927-07-13   Cancelled treatment:       Reason Eval/Treat Not Completed: Medical issues which prohibited therapy. Pt transitioning to comfort care. Will sign off at this time.    Bernabe Dorce, Katherene Ponto 06/25/19, 8:03 AM

## 2019-07-04 DEATH — deceased

## 2019-10-07 ENCOUNTER — Ambulatory Visit: Payer: Medicare Other | Admitting: Adult Health

## 2023-02-14 ENCOUNTER — Encounter (HOSPITAL_COMMUNITY): Payer: Self-pay | Admitting: Emergency Medicine
